# Patient Record
Sex: Male | Born: 1965 | Race: Black or African American | Hispanic: No | Marital: Married | Smoking: Never smoker
Health system: Southern US, Community
[De-identification: ages and names within clinical notes are randomized; demographics above are authoritative.]

## PROBLEM LIST (undated history)

## (undated) DIAGNOSIS — R0602 Shortness of breath: Secondary | ICD-10-CM

## (undated) DIAGNOSIS — Z8489 Family history of other specified conditions: Secondary | ICD-10-CM

## (undated) DIAGNOSIS — G709 Myoneural disorder, unspecified: Secondary | ICD-10-CM

## (undated) DIAGNOSIS — I5042 Chronic combined systolic (congestive) and diastolic (congestive) heart failure: Secondary | ICD-10-CM

## (undated) DIAGNOSIS — H269 Unspecified cataract: Secondary | ICD-10-CM

## (undated) DIAGNOSIS — I420 Dilated cardiomyopathy: Secondary | ICD-10-CM

## (undated) DIAGNOSIS — I5189 Other ill-defined heart diseases: Secondary | ICD-10-CM

## (undated) DIAGNOSIS — C61 Malignant neoplasm of prostate: Secondary | ICD-10-CM

## (undated) DIAGNOSIS — R569 Unspecified convulsions: Secondary | ICD-10-CM

## (undated) DIAGNOSIS — I10 Essential (primary) hypertension: Secondary | ICD-10-CM

## (undated) DIAGNOSIS — E785 Hyperlipidemia, unspecified: Secondary | ICD-10-CM

## (undated) DIAGNOSIS — F419 Anxiety disorder, unspecified: Secondary | ICD-10-CM

## (undated) HISTORY — PX: SHOULDER SURGERY: SHX246

## (undated) HISTORY — DX: Anxiety disorder, unspecified: F41.9

## (undated) HISTORY — DX: Unspecified cataract: H26.9

## (undated) HISTORY — PX: NASAL FRACTURE SURGERY: SHX718

## (undated) HISTORY — DX: Unspecified convulsions: R56.9

## (undated) HISTORY — DX: Chronic combined systolic (congestive) and diastolic (congestive) heart failure: I50.42

## (undated) HISTORY — PX: PROSTATE BIOPSY: SHX241

## (undated) HISTORY — DX: Hyperlipidemia, unspecified: E78.5

## (undated) HISTORY — DX: Dilated cardiomyopathy: I42.0

## (undated) HISTORY — DX: Myoneural disorder, unspecified: G70.9

## (undated) HISTORY — DX: Other ill-defined heart diseases: I51.89

---

## 2001-02-12 ENCOUNTER — Emergency Department (HOSPITAL_COMMUNITY): Admission: EM | Admit: 2001-02-12 | Discharge: 2001-02-12 | Payer: Self-pay | Admitting: Emergency Medicine

## 2005-05-16 ENCOUNTER — Emergency Department (HOSPITAL_COMMUNITY): Admission: EM | Admit: 2005-05-16 | Discharge: 2005-05-16 | Payer: Self-pay | Admitting: Emergency Medicine

## 2008-04-14 ENCOUNTER — Ambulatory Visit: Payer: Self-pay | Admitting: Cardiology

## 2008-04-15 ENCOUNTER — Encounter (INDEPENDENT_AMBULATORY_CARE_PROVIDER_SITE_OTHER): Payer: Self-pay | Admitting: Internal Medicine

## 2008-04-15 ENCOUNTER — Inpatient Hospital Stay (HOSPITAL_COMMUNITY): Admission: EM | Admit: 2008-04-15 | Discharge: 2008-04-17 | Payer: Self-pay | Admitting: Emergency Medicine

## 2008-07-10 ENCOUNTER — Ambulatory Visit: Payer: Self-pay | Admitting: Family Medicine

## 2008-07-17 ENCOUNTER — Ambulatory Visit: Payer: Self-pay | Admitting: Family Medicine

## 2010-05-05 LAB — POCT I-STAT, CHEM 8
BUN: 19 mg/dL (ref 6–23)
Chloride: 102 mEq/L (ref 96–112)
Creatinine, Ser: 1.4 mg/dL (ref 0.4–1.5)
Potassium: 3.8 mEq/L (ref 3.5–5.1)
Sodium: 138 mEq/L (ref 135–145)

## 2010-05-05 LAB — DIFFERENTIAL
Eosinophils Absolute: 0.1 10*3/uL (ref 0.0–0.7)
Eosinophils Relative: 1 % (ref 0–5)
Lymphs Abs: 3.1 10*3/uL (ref 0.7–4.0)
Monocytes Absolute: 0.9 10*3/uL (ref 0.1–1.0)
Monocytes Relative: 10 % (ref 3–12)

## 2010-05-05 LAB — COMPREHENSIVE METABOLIC PANEL
ALT: 22 U/L (ref 0–53)
AST: 30 U/L (ref 0–37)
AST: 35 U/L (ref 0–37)
Albumin: 3.6 g/dL (ref 3.5–5.2)
Albumin: 4.2 g/dL (ref 3.5–5.2)
Alkaline Phosphatase: 65 U/L (ref 39–117)
Alkaline Phosphatase: 70 U/L (ref 39–117)
BUN: 22 mg/dL (ref 6–23)
CO2: 27 mEq/L (ref 19–32)
Chloride: 103 mEq/L (ref 96–112)
Chloride: 103 mEq/L (ref 96–112)
Creatinine, Ser: 1.21 mg/dL (ref 0.4–1.5)
GFR calc Af Amer: >60 mL/min (ref >60)
GFR calc Af Amer: >60 mL/min (ref >60)
GFR calc non Af Amer: >60 mL/min (ref >60)
Potassium: 3.8 mEq/L (ref 3.5–5.1)
Potassium: 4.4 mEq/L (ref 3.5–5.1)
Sodium: 136 mEq/L (ref 135–145)
Total Bilirubin: 0.7 mg/dL (ref 0.3–1.2)
Total Bilirubin: 1.1 mg/dL (ref 0.3–1.2)
Total Protein: 6.3 g/dL (ref 6.0–8.3)

## 2010-05-05 LAB — CK TOTAL AND CKMB (NOT AT ARMC)
CK, MB: 10.8 ng/mL — ABNORMAL HIGH (ref 0.3–4.0)
CK, MB: 4.7 ng/mL — ABNORMAL HIGH (ref 0.3–4.0)
Relative Index: 1.2 (ref 0.0–2.5)
Total CK: 934 U/L — ABNORMAL HIGH (ref 7–232)

## 2010-05-05 LAB — CBC
HCT: 39.3 % (ref 39.0–52.0)
HCT: 40.1 % (ref 39.0–52.0)
Hemoglobin: 13.9 g/dL (ref 13.0–17.0)
MCV: 92.8 fL (ref 78.0–100.0)
MCV: 93.1 fL (ref 78.0–100.0)
Platelets: 336 10*3/uL (ref 150–400)
Platelets: 336 10*3/uL (ref 150–400)
RBC: 4.22 MIL/uL (ref 4.22–5.81)
RBC: 4.32 MIL/uL (ref 4.22–5.81)
WBC: 8.8 10*3/uL (ref 4.0–10.5)
WBC: 9.5 10*3/uL (ref 4.0–10.5)

## 2010-05-05 LAB — PROTIME-INR: INR: 1 (ref 0.00–1.49)

## 2010-05-05 LAB — POCT CARDIAC MARKERS
Myoglobin, poc: 198 ng/mL (ref 12–200)
Troponin i, poc: <0.05 ng/mL (ref 0.00–0.09)

## 2010-05-05 LAB — LIPID PANEL
Cholesterol: 205 mg/dL — ABNORMAL HIGH (ref 0–200)
HDL: 71 mg/dL (ref >39)
LDL Cholesterol: 120 mg/dL — ABNORMAL HIGH (ref 0–99)
Triglycerides: 72 mg/dL (ref <150)

## 2010-05-05 LAB — CARDIAC PANEL(CRET KIN+CKTOT+MB+TROPI)
CK, MB: 7.3 ng/mL — ABNORMAL HIGH (ref 0.3–4.0)
Relative Index: 1.1 (ref 0.0–2.5)
Relative Index: 1.1 (ref 0.0–2.5)

## 2010-05-05 LAB — APTT: aPTT: 30 seconds (ref 24–37)

## 2010-05-05 LAB — MAGNESIUM: Magnesium: 2.5 mg/dL (ref 1.5–2.5)

## 2010-06-07 NOTE — Discharge Summary (Signed)
[name], [name]                  ACCOUNT NO.:  [id number]   MEDICAL RECORD NO.:  [id number]          PATIENT TYPE:  INP   LOCATION:  1433                         FACILITY:  [facility]   PHYSICIAN:  Herbie Saxon, MDDATE OF BIRTH:  1965/05/28   DATE OF ADMISSION:  04/14/2008  DATE OF DISCHARGE:  04/17/2008                               DISCHARGE SUMMARY   DISCHARGE DIAGNOSES:  1. Chest pain, non cardiac.  2. Hypertension urgency, resolved.  3. Rhabdomyolysis, resolved.  4. Hyperlipidemia.  5. Anxiety.  6. Chronic bronchitis.   PROCEDURE:  A 2D echocardiogram of April 15, 2008, showed normal left  ventricular ejection fraction ranging between 55% to 65%.  No left  ventricular regional wall motion abnormality.  There was moderate  increase in left ventricular wall thickness.   RADIOLOGY:  1. A chest x-ray of April 14, 2008, showed minimal bronchitic changes      with minimal scarring, no atelectasis in the left base.  2. Left hip x-ray of April 14, 2008, showed no acute abnormalities.   HOSPITAL COURSE:  This 45 year old African American male presented to  the emergency room complaining of left-sided hip and chest pain.  Patient also reports shortness of breath and diaphoresis with exertion.  Serial cardiac enzymes were negative for acute coronary syndrome.  The  initial CPK was elevated indicating rhabdomyolysis.  Patient was started  on IV fluid hydration, anti-hypertensives at a.c. and beta-blocker,  p.r.n. nitroglycerin.  The lipid panel did show elevated total  cholesterol with elevated LDL fraction and patient has been started on  Zocor on this admission.  Hydrochlorothiazide as added to the blood  pressure regimen on April 16, 2008, to optimize his blood pressure  control.  Patient had been educated on the need for good medication  compliance as an outpatient.   DISCHARGE CONDITION:  Stable.   DIET:  Will be heart healthy, low cholesterol.   ACTIVITY:  To  increase slowly as tolerated.  Patient to report to  emergency room if there is any recurrence of chest pain, shortness of  breath, or dizziness.  Clinical social worker input to assist with  establishing with [facility] in the next 1 week as outpatient.   MEDICATIONS ON DISCHARGE:  1. Enteric-coated aspirin 81 mg daily.  2. Lisinopril 10 mg daily.  3. __________ mg twice daily as needed for anxiety.  4. Nitroglycerin 0.4 mg sublingual every 5 minutes x3 as needed.  5. Clonidine 0.4 mg every 8 hours p.r.n. for blood pressure greater      than 160/110.  6. Simvastatin 40 mg q.h.s.  7. Metoprolol 50 mg b.i.d.  8. Hydrochlorothiazide 25 mg daily.   EXAMINATION:  This is a middle-aged man not in acute distress.  VITAL SIGNS:  Temperature 98.  Pulse 82.  Respiratory rate is 20.  Blood  pressure 140/90.  Pupils equal and reactive to light and accommodation.  HEAD:  Atraumatic, normocephalic.  Mucous membranes are moist.  Oropharynx and nasopharynx are clear.  NECK:  Supple.  No elevated JVD.  No thyromegaly.  No carotid bruit.  CHEST:  Clinically clear.  HEART:  Sounds 1 and 2, regular rate and rhythm.  No rubs, gallops, or  murmurs.  ABDOMEN:  Soft, nontender.  No organomegaly.  Bowel sounds present.  No  inguinal hernia.  CNS:  No focal deficits.  Peripheral pulses present.  No pedal edema.   LABS:  Total CK is 428.  Chemistries, sodium 136, potassium 3.8,  chloride 103, bicarbonate 26, glucose 114, BUN 22, creatinine 1.4.  Troponin 0.03.  Complete blood count, WBC is 9, hematocrit 39, platelet  count 336.   DISCHARGE TIME:  Greater than 30 minutes.      Herbie Saxon, MD  Electronically Signed     MIO/MEDQ  D:  04/17/2008  T:  04/17/2008  Job:  [id number]

## 2010-06-07 NOTE — H&P (Signed)
[name], [name] NO.:  [id number]   MEDICAL RECORD NO.:  [id number]          PATIENT TYPE:  EMS   LOCATION:  ED                           FACILITY:  [facility]   PHYSICIAN:  Manus Gunning, MD      DATE OF BIRTH:  05-18-1965   DATE OF ADMISSION:  04/15/2008  DATE OF DISCHARGE:                              HISTORY & PHYSICAL   ADMITTING SERVICE:  Hospitalist [facility].   CHIEF COMPLAINT:  Left sided hip pain and chest pain.   HISTORY OF PRESENT ILLNESS:  [name] is a pleasant 45 year old African  American male who originally presented with left sided hip pain.  This  has been chronic in nature off and on and sporadic.  He describes it as  rubbing of bone against each other.  X-ray of the hip in the emergency  department demonstrated no acute abnormality and he had symptomatic  improvement with pain medications.  During questioning of patient's  symptoms, he also complained of chest pain which he described as  pressure/tightness, substernal location, approximately 6-8/10, worse  with exertion, approximately 20 minutes of walking on a treadmill would  result in the pain, relieved with rest, associated with shortness of  breath and diaphoresis.  He claims that this has been ongoing for  approximately 4-6 months and that occasionally it is sporadic but often  than not it is brought on by exertion.  He denies palpitations, denies  loss of consciousness.  No syncope, no presyncope, no tinnitus, no  blurring of vision.  No odynophagia, no dysphagia.  No abdominal pain,  no nausea, vomiting.  No PND, no orthopnea.  No cough, no expectoration.  Positive dyspnea on exertion causing shortness of breath.  No bowel or  bladder incontinence.  No bright red blood per rectum, no diarrhea, no  constipation, no dysuria, no polyuria, no musculoskeletal complaints  except for his left hip pain.   PAST MEDICAL HISTORY:  None.  Apparently, he claims that he has been  told  he has hypertension, but has never followed up.   HOME MEDICATIONS:  None.   ALLERGIES:  NONE.   PAST SURGICAL HISTORY:  Fracture of the nose, repaired.   FAMILY HISTORY:  Mother had a history of CABG in her 37s.  Father is  healthy.   SOCIAL HISTORY:  The patient denies tobacco, claims that he drinks  approximately a 12-pack of beer over the course of a week.  Denies  illicit drug use.  He lives at home with his wife and children.   REVIEW OF SYSTEMS:  Essentially a 14-point review of systems has been  performed.  Pertinent positives and negatives as described above.   PHYSICAL EXAMINATION:  VITAL SIGNS:  Temperature 98, pulse 116,  respiratory rate 20, blood pressure 195/116.  O2 saturation 96% on room  air.  GENERAL:  A well-nourished, well-developed African American male lying  in bed comfortably in no apparent distress.  HEENT:  Normocephalic, atraumatic.  Moist oral mucosa.  No thrush,  erythema or postnasal drip.  Eyes are anicteric.  Extraocular  muscles  are intact.  Pupils are equal and reactive to light and accommodation.  NECK:  Supple.  Good range of motion.  No thyromegaly.  No carotid  bruits.  Neck veins appear to be normal.  CARDIOVASCULAR:  S1-S2 normal.  Regular rate and rhythm.  No murmurs,  rubs or gallops.  RS:  Air entry is bilaterally equal.  No rales, rhonchi or wheezes  appreciated.  ABDOMEN:  Soft, nontender, nondistended.  Positive bowel sounds.  No  organomegaly.  EXTREMITIES:  No cyanosis, no clubbing edema, no edema.  Positive  bilateral dorsalis pedis.  CNS:  Alert and oriented x3.  Cranial nerves II through XII are grossly  intact.  Power, sensation and reflexes bilaterally symmetrical.  SKIN:  No breakdown, swelling, ulcerations or masses.  HEM/ONC:  No palpable lymphadenopathy, ecchymosis, bruising or  petechiae.   LABORATORY DATA:  EKG normal sinus rhythm, borderline LVH based on  voltage criteria, though the patient is young.  Troponin-I  0.03,  myoglobin 198, CK-MB 8.2.  Troponin-I repeat at this point less than  0.05.  Relative index of 1.2, CK-MB 10.8.  Sodium 138, potassium 3.8,  chloride 102, BUN 19, creatinine 1.4, glucose 94, calcium 1.1,  hemoglobin 14.6, hematocrit 43, WBC count 8800, platelet count 336.  Left hip complete two-view demonstrates no acute abnormalities.  Chest x-  ray PA and lateral demonstrates minimal bronchitic changes, minimal  scarring or atelectasis in the left base.   ASSESSMENT/PLAN:  Chest pain, rule out acute coronary syndrome.  The  patient describes symptoms of stable angina.  At this time, start the  patient on nitroglycerin 0.4 mg sublingual q.5 minutes p.r.n. chest  pain, max of three doses per episode.  If the pain remains uncontrolled,  use morphine 2 mg IV q.6 h., for uncontrolled pain with nitroglycerin.  Start aspirin 325 mg p.o. daily and oxygen to keep O2 sats greater than  90%.  The patient has uncontrolled blood pressure (hypertensive  urgency).  Start lisinopril 5 mg p.o. daily with metoprolol 25 mg twice  a day.  On reassessment of his blood pressure by the emergency  department status the patient's blood pressure has decreased to 150/90  post 0.2 mg of clonidine.  He has been ordered to be given 5 mg of  metoprolol IV by the emergency department physician.  I will concur and  recommend starting his lisinopril and metoprolol p.o. doses in the  morning.  1. Make patient n.p.o. past midnight for possible stress test in the      morning, though at this time no stress test has been ordered.  We      will check serial cardiac enzymes q.6 h., x3 sets and obtain an EKG      in the morning, as well as a 2-D echocardiogram.  If in the morning      either his biomarkers or EKG are changed or 2-D echocardiogram      demonstrates wall motion abnormalities, cardiology can be consulted      at that time and assessment for further testing can be done then.      At this time, the patient  is to be admitted for serial enzymes and      control of his blood pressure.  2. Gastrointestinal and deep venous thrombosis prophylaxis.  Start      heparin 5000 units subcu q.8 and Protonix 40 mg p.o. daily.      Manus Gunning, MD  Electronically Signed  SP/MEDQ  D:  04/15/2008  T:  04/15/2008  Job:  [id number]

## 2012-04-02 ENCOUNTER — Emergency Department (HOSPITAL_COMMUNITY)
Admission: EM | Admit: 2012-04-02 | Discharge: 2012-04-02 | Discharge status: Absent without leave | Payer: BC Managed Care – PPO | Source: Home / Self Care | Attending: Family Medicine | Admitting: Family Medicine

## 2012-04-02 NOTE — ED Notes (Signed)
No answer in lobby x 2.

## 2012-04-02 NOTE — ED Notes (Signed)
No answer in lobby x1

## 2012-04-02 NOTE — ED Notes (Signed)
No answer in lobby x 3 

## 2012-05-25 ENCOUNTER — Encounter (HOSPITAL_COMMUNITY): Payer: Self-pay | Admitting: *Deleted

## 2012-05-25 ENCOUNTER — Emergency Department (HOSPITAL_COMMUNITY): Payer: BC Managed Care – PPO

## 2012-05-25 ENCOUNTER — Inpatient Hospital Stay (HOSPITAL_COMMUNITY)
Admission: EM | Admit: 2012-05-25 | Discharge: 2012-05-28 | DRG: 127 | Disposition: A | Discharge status: Home / Self Care | Payer: BC Managed Care – PPO | Attending: Family Medicine | Admitting: Family Medicine

## 2012-05-25 DIAGNOSIS — J189 Pneumonia, unspecified organism: Secondary | ICD-10-CM

## 2012-05-25 DIAGNOSIS — R0989 Other specified symptoms and signs involving the circulatory and respiratory systems: Secondary | ICD-10-CM | POA: Diagnosis present

## 2012-05-25 DIAGNOSIS — Z6832 Body mass index (BMI) 32.0-32.9, adult: Secondary | ICD-10-CM

## 2012-05-25 DIAGNOSIS — Z9119 Patient's noncompliance with other medical treatment and regimen: Secondary | ICD-10-CM

## 2012-05-25 DIAGNOSIS — E785 Hyperlipidemia, unspecified: Secondary | ICD-10-CM | POA: Diagnosis present

## 2012-05-25 DIAGNOSIS — I10 Essential (primary) hypertension: Secondary | ICD-10-CM | POA: Diagnosis present

## 2012-05-25 DIAGNOSIS — R0902 Hypoxemia: Secondary | ICD-10-CM | POA: Diagnosis present

## 2012-05-25 DIAGNOSIS — I2510 Atherosclerotic heart disease of native coronary artery without angina pectoris: Secondary | ICD-10-CM | POA: Diagnosis present

## 2012-05-25 DIAGNOSIS — R079 Chest pain, unspecified: Secondary | ICD-10-CM

## 2012-05-25 DIAGNOSIS — I5021 Acute systolic (congestive) heart failure: Principal | ICD-10-CM | POA: Diagnosis present

## 2012-05-25 DIAGNOSIS — I509 Heart failure, unspecified: Secondary | ICD-10-CM

## 2012-05-25 DIAGNOSIS — F1210 Cannabis abuse, uncomplicated: Secondary | ICD-10-CM | POA: Diagnosis present

## 2012-05-25 DIAGNOSIS — Z91199 Patient's noncompliance with other medical treatment and regimen due to unspecified reason: Secondary | ICD-10-CM

## 2012-05-25 DIAGNOSIS — R0600 Dyspnea, unspecified: Secondary | ICD-10-CM

## 2012-05-25 DIAGNOSIS — R0609 Other forms of dyspnea: Secondary | ICD-10-CM | POA: Diagnosis present

## 2012-05-25 DIAGNOSIS — I420 Dilated cardiomyopathy: Secondary | ICD-10-CM

## 2012-05-25 HISTORY — DX: Shortness of breath: R06.02

## 2012-05-25 HISTORY — DX: Hyperlipidemia, unspecified: E78.5

## 2012-05-25 HISTORY — DX: Essential (primary) hypertension: I10

## 2012-05-25 LAB — POCT I-STAT, CHEM 8
Calcium, Ion: 1.01 mmol/L — ABNORMAL LOW (ref 1.12–1.23)
Creatinine, Ser: 1.3 mg/dL (ref 0.50–1.35)
Glucose, Bld: 94 mg/dL (ref 70–99)
Hemoglobin: 14.3 g/dL (ref 13.0–17.0)
Sodium: 143 mEq/L (ref 135–145)
TCO2: 25 mmol/L (ref 0–100)

## 2012-05-25 LAB — CBC
MCH: 31.7 pg (ref 26.0–34.0)
MCV: 90.8 fL (ref 78.0–100.0)
Platelets: 216 10*3/uL (ref 150–400)
RBC: 4.26 MIL/uL (ref 4.22–5.81)
RDW: 12.9 % (ref 11.5–15.5)

## 2012-05-25 LAB — TROPONIN I
Troponin I: <0.3 ng/mL (ref <0.30)
Troponin I: <0.3 ng/mL (ref <0.30)

## 2012-05-25 LAB — APTT: aPTT: 25 seconds (ref 24–37)

## 2012-05-25 LAB — COMPREHENSIVE METABOLIC PANEL
AST: 35 U/L (ref 0–37)
Alkaline Phosphatase: 68 U/L (ref 39–117)
CO2: 25 mEq/L (ref 19–32)
Chloride: 101 mEq/L (ref 96–112)
Creatinine, Ser: 1.07 mg/dL (ref 0.50–1.35)
GFR calc non Af Amer: 80 mL/min — ABNORMAL LOW (ref >90)
Total Bilirubin: 0.4 mg/dL (ref 0.3–1.2)

## 2012-05-25 LAB — HEPARIN LEVEL (UNFRACTIONATED): Heparin Unfractionated: 0.24 IU/mL — ABNORMAL LOW (ref 0.30–0.70)

## 2012-05-25 LAB — POCT I-STAT TROPONIN I

## 2012-05-25 MED ORDER — AZITHROMYCIN 250 MG PO TABS
500.0000 mg | ORAL_TABLET | Freq: Once | ORAL | Status: AC
  Administered 2012-05-25: 500 mg via ORAL
  Filled 2012-05-25: qty 2

## 2012-05-25 MED ORDER — SODIUM CHLORIDE 0.9 % IJ SOLN
3.0000 mL | Freq: Two times a day (BID) | INTRAMUSCULAR | Status: DC
  Administered 2012-05-26 – 2012-05-27 (×3): 3 mL via INTRAVENOUS

## 2012-05-25 MED ORDER — ONDANSETRON HCL 4 MG PO TABS: 4.0000 mg | ORAL_TABLET | Freq: Four times a day (QID) | ORAL | Status: DC | PRN

## 2012-05-25 MED ORDER — HEPARIN SODIUM (PORCINE) 5000 UNIT/ML IJ SOLN: 5000.0000 [IU] | Freq: Three times a day (TID) | INTRAMUSCULAR | Status: DC

## 2012-05-25 MED ORDER — HEPARIN BOLUS VIA INFUSION
2000.0000 [IU] | Freq: Once | INTRAVENOUS | Status: AC
  Administered 2012-05-25: 2000 [IU] via INTRAVENOUS
  Filled 2012-05-25: qty 2000

## 2012-05-25 MED ORDER — MORPHINE SULFATE 2 MG/ML IJ SOLN: 1.0000 mg | INTRAMUSCULAR | Status: DC | PRN

## 2012-05-25 MED ORDER — ASPIRIN 325 MG PO TABS
325.0000 mg | ORAL_TABLET | Freq: Every day | ORAL | Status: DC
  Administered 2012-05-26: 325 mg via ORAL
  Filled 2012-05-25: qty 1

## 2012-05-25 MED ORDER — HEPARIN BOLUS VIA INFUSION
4000.0000 [IU] | Freq: Once | INTRAVENOUS | Status: AC
  Administered 2012-05-25: 4000 [IU] via INTRAVENOUS
  Filled 2012-05-25: qty 4000

## 2012-05-25 MED ORDER — METOPROLOL TARTRATE 25 MG PO TABS
25.0000 mg | ORAL_TABLET | Freq: Two times a day (BID) | ORAL | Status: DC
  Administered 2012-05-25 – 2012-05-26 (×3): 25 mg via ORAL
  Filled 2012-05-25 (×4): qty 1

## 2012-05-25 MED ORDER — HYDRALAZINE HCL 20 MG/ML IJ SOLN
10.0000 mg | Freq: Four times a day (QID) | INTRAMUSCULAR | Status: DC | PRN
  Administered 2012-05-25 – 2012-05-26 (×2): 10 mg via INTRAVENOUS
  Filled 2012-05-25 (×2): qty 1

## 2012-05-25 MED ORDER — HEPARIN (PORCINE) IN NACL 100-0.45 UNIT/ML-% IJ SOLN
1350.0000 [IU]/h | INTRAMUSCULAR | Status: DC
  Administered 2012-05-25: 1150 [IU]/h via INTRAVENOUS
  Administered 2012-05-26: 1350 [IU]/h via INTRAVENOUS
  Filled 2012-05-25 (×3): qty 250

## 2012-05-25 MED ORDER — DEXTROSE 5 % IV SOLN
1.0000 g | Freq: Once | INTRAVENOUS | Status: AC
  Administered 2012-05-25: 1 g via INTRAVENOUS
  Filled 2012-05-25: qty 10

## 2012-05-25 MED ORDER — ACETAMINOPHEN 650 MG RE SUPP: 650.0000 mg | Freq: Four times a day (QID) | RECTAL | Status: DC | PRN

## 2012-05-25 MED ORDER — OXYCODONE HCL 5 MG PO TABS: 5.0000 mg | ORAL_TABLET | ORAL | Status: DC | PRN

## 2012-05-25 MED ORDER — ACETAMINOPHEN 325 MG PO TABS: 650.0000 mg | ORAL_TABLET | Freq: Four times a day (QID) | ORAL | Status: DC | PRN

## 2012-05-25 MED ORDER — FUROSEMIDE 10 MG/ML IJ SOLN
40.0000 mg | Freq: Once | INTRAMUSCULAR | Status: AC
  Administered 2012-05-25: 40 mg via INTRAVENOUS
  Filled 2012-05-25: qty 4

## 2012-05-25 MED ORDER — NITROGLYCERIN 0.4 MG SL SUBL: 0.4000 mg | SUBLINGUAL_TABLET | SUBLINGUAL | Status: DC | PRN

## 2012-05-25 MED ORDER — GUAIFENESIN ER 600 MG PO TB12
1200.0000 mg | ORAL_TABLET | Freq: Two times a day (BID) | ORAL | Status: DC
  Administered 2012-05-25 – 2012-05-28 (×6): 1200 mg via ORAL
  Filled 2012-05-25 (×7): qty 2

## 2012-05-25 MED ORDER — SODIUM CHLORIDE 0.9 % IV SOLN
INTRAVENOUS | Status: DC
  Administered 2012-05-25 – 2012-05-26 (×2): via INTRAVENOUS

## 2012-05-25 MED ORDER — METOPROLOL TARTRATE 25 MG PO TABS: 12.5000 mg | ORAL_TABLET | Freq: Two times a day (BID) | ORAL | Status: DC

## 2012-05-25 MED ORDER — LEVOFLOXACIN IN D5W 750 MG/150ML IV SOLN
750.0000 mg | INTRAVENOUS | Status: DC
  Administered 2012-05-25: 750 mg via INTRAVENOUS
  Filled 2012-05-25 (×3): qty 150

## 2012-05-25 MED ORDER — ONDANSETRON HCL 4 MG/2ML IJ SOLN: 4.0000 mg | Freq: Four times a day (QID) | INTRAMUSCULAR | Status: DC | PRN

## 2012-05-25 NOTE — ED Notes (Signed)
Dr. Mayford Knife here to see pt.

## 2012-05-25 NOTE — Progress Notes (Signed)
ANTICOAGULATION CONSULT NOTE  Pharmacy Consult for Heparin Indication: chest pain/ACS  No Known Allergies  Patient Measurements: Height: 5' 9.5" (176.5 cm) Weight: 220 lb 7.4 oz (100 kg) IBW/kg (Calculated) : 71.85 Heparin Dosing Weight: 93.2 kg  Vital Signs: Temp: 98.3 F (36.8 C) (05/03 2100) Temp src: Oral (05/03 1643) BP: 146/84 mmHg (05/03 2100) Pulse Rate: 96 (05/03 2100)  Labs:  Recent Labs  05/25/12 1207 05/25/12 1234 05/25/12 1540 05/25/12 1718 05/25/12 2105 05/25/12 2239  HGB 13.5 14.3  --   --   --   --   HCT 38.7* 42.0  --   --   --   --   PLT 216  --   --   --   --   --   APTT 25  --   --   --   --   --   LABPROT 13.0  --   --   --   --   --   INR 0.99  --   --   --   --   --   HEPARINUNFRC  --   --   --   --   --  0.24*  CREATININE 1.07 1.30  --   --   --   --   TROPONINI  --   --  <0.30 <0.30 <0.30  --     Estimated Creatinine Clearance: 81.7 ml/min (by C-G formula based on Cr of 1.3).  Assessment:  47 yo male with chest pain for heparin  Goal of Therapy:  Heparin level 0.3-0.7 units/ml Monitor platelets by anticoagulation protocol: Yes   Plan:  Heparin 2000 units IV bolus, then increase heparin 1350 units/hr Follow-up am labs.  Eddie Candle 05/25/2012,11:20 PM

## 2012-05-25 NOTE — ED Provider Notes (Signed)
History     CSN: [id number]  Arrival date & time 05/25/12  1116   First MD Initiated Contact with Patient 05/25/12 1118      Chief Complaint  Patient presents with  . Chest Pain    (Consider location/radiation/quality/duration/timing/severity/associated sxs/prior treatment) The history is provided by the patient.   patient presents with shortness of breath and chest pain. His been going on for a while he states. He is post but on blood pressure medicines but states she's been off them for 3 years because he did not like the way it made him feel. He states he has had decreased physical activity due to shortness of breath and chest tightness. He states that he does physical activity is as short of breath, has chest tightness and will sometimes get sweaty. No fevers. No cough. He states also had episodes of the chest tightness at rest. No weight loss. He does not smoke. He denies other drug use. He has no pain now. He was hypoxic for EMS with sats in the 80s. He has an occasional headache.  Past Medical History  Diagnosis Date  . Hypertension   . Coronary artery disease   . Hyperlipidemia     History reviewed. No pertinent past surgical history.  History reviewed. No pertinent family history.  History  Substance Use Topics  . Smoking status: Never Smoker   . Smokeless tobacco: Not on file  . Alcohol Use: 14.4 oz/week    24 Cans of beer per week      Review of Systems  Constitutional: Negative for activity change and appetite change.  HENT: Negative for neck stiffness.   Eyes: Negative for pain.  Respiratory: Positive for chest tightness and shortness of breath.   Cardiovascular: Positive for chest pain. Negative for leg swelling.  Gastrointestinal: Negative for nausea, vomiting, abdominal pain and diarrhea.  Genitourinary: Negative for flank pain.  Musculoskeletal: Negative for back pain.  Skin: Negative for rash.  Neurological: Positive for headaches. Negative for  weakness and numbness.  Psychiatric/Behavioral: Negative for behavioral problems.    Allergies  Review of patient's allergies indicates no known allergies.  Home Medications   Current Outpatient Rx  Name  Route  Sig  Dispense  Refill  . Multiple Vitamin (MULTIVITAMIN WITH MINERALS) TABS   Oral   Take 1 tablet by mouth daily.           BP 168/97  Pulse 100  Temp(Src) 98.3 F (36.8 C) (Oral)  Resp 24  SpO2 100%  Physical Exam  Nursing note and vitals reviewed. Constitutional: He is oriented to person, place, and time. He appears well-developed and well-nourished.  HENT:  Head: Normocephalic and atraumatic.  Eyes: EOM are normal. Pupils are equal, round, and reactive to light.  Neck: Normal range of motion. Neck supple.  Cardiovascular: Normal rate, regular rhythm and normal heart sounds.   No murmur heard. Pulmonary/Chest: Effort normal and breath sounds normal.  Abdominal: Soft. Bowel sounds are normal. He exhibits no distension and no mass. There is no tenderness. There is no rebound and no guarding.  Musculoskeletal: Normal range of motion. He exhibits edema.  Mild bilateral lower extremity pitting edema.  Neurological: He is alert and oriented to person, place, and time. No cranial nerve deficit.  Skin: Skin is warm and dry.  Psychiatric: He has a normal mood and affect.    ED Course  Procedures (including critical care time)  Labs Reviewed  CBC - Abnormal; Notable for the following:  HCT 38.7 (*)    All other components within normal limits  PRO B NATRIURETIC PEPTIDE - Abnormal; Notable for the following:    Pro B Natriuretic peptide (BNP) 480.3 (*)    All other components within normal limits  COMPREHENSIVE METABOLIC PANEL - Abnormal; Notable for the following:    GFR calc non Af Amer 80 (*)    All other components within normal limits  POCT I-STAT, CHEM 8 - Abnormal; Notable for the following:    Calcium, Ion 1.01 (*)    All other components within  normal limits  PROTIME-INR  APTT  D-DIMER, QUANTITATIVE  POCT I-STAT TROPONIN I   Dg Chest 2 View  05/25/2012  *RADIOLOGY REPORT*  Clinical Data: Shortness of breath  CHEST - 2 VIEW  Comparison: 04/14/2008  Findings: Atelectasis or infiltrate is seen in the right mid lung, along the minor fissure.  There is some linear opacity in the left midlung, probably atelectatic. Cardiopericardial silhouette is at upper limits of normal for size.  No evidence for pleural effusion. The cardiopericardial silhouette is within normal limits for size. Imaged bony structures of the thorax are intact.  IMPRESSION: Atelectasis and / or infiltrate in the right midlung.   Original Report Authenticated By: Kennith Center, M.D.      1. Chest pain   2. Hypoxia   3. CAP (community acquired pneumonia)      Date: 05/25/2012  Rate: 93  Rhythm: normal sinus rhythm  QRS Axis: normal  Intervals: normal  ST/T Wave abnormalities: nonspecific T wave changes  Conduction Disutrbances:none  Narrative Interpretation:   Old EKG Reviewed: none available    MDM  Patient presents with episodes of shortness of breath and chest pain. His been going on for a while. His been worse recently. He states he does a certain amount of exercise and has to take breaks because of chest pain shortness of breath and sometimes sweating. His EKG is reassuring. He is posterior and blood pressure medicines but does not. He is hypoxic here with ambulation. He will be desaturate into the mid 80s. His x-ray shows possible infiltrate. He has no white count and no production of sputum. He'll be treated with antibiotics while it is further investigated. He'll be admitted to internal medicine. He has a negative d-dimer        Juliet Rude. Rubin Payor, MD 05/25/12 1452

## 2012-05-25 NOTE — H&P (Signed)
[facility] History and Physical  [name] ZOX:[id number] DOB: 08-07-1965 DOA: 05/25/2012  Referring physician: Juliet [name]. Dalton Payor, MD PCP: No PCP Per Patient  Specialists: None  Chief Complaint: Chest pain and shortness of breath  HPI: [name] is a 47 y.o. African American male with past medical history of uncontrolled hypertension, patient came to the hospital because of worsening shortness of breath. Patient said for the past few weeks he started to have shortness of breath especially on exertion. He also gets chest pain with it. The chest pain is inframammary, 7/10, felt like a tightness and increased with exertion. This morning he had the pain without exertion so he came in to the emergency department for further evaluation. In the emergency department he was found to be hypoxic also after walking around for short period of time. Initial workup showed questionable pneumonia versus atelectasis on x-ray, 12-lead EKG showed normal sinus rhythm with no evidence of ischemia and first set of cardiac enzymes is negative. Patient also is negative D. dimers.  Review of Systems:  Constitutional: negative for anorexia, fevers and sweats Eyes: negative for irritation, redness and visual disturbance Ears, nose, mouth, throat, and face: negative for earaches, epistaxis, nasal congestion and sore throat Respiratory: negative for cough, dyspnea on exertion, sputum and wheezing Cardiovascular: Has chest pain on exertion Gastrointestinal: negative for abdominal pain, constipation, diarrhea, melena, nausea and vomiting Genitourinary:negative for dysuria, frequency and hematuria Hematologic/lymphatic: negative for bleeding, easy bruising and lymphadenopathy Musculoskeletal:negative for arthralgias, muscle weakness and stiff joints Neurological: negative for coordination problems, gait problems, headaches and weakness Endocrine: negative for diabetic symptoms including polydipsia, polyuria and  weight loss Allergic/Immunologic: negative for anaphylaxis, hay fever and urticaria   Past Medical History  Diagnosis Date  . Hypertension   . Coronary artery disease   . Hyperlipidemia    History reviewed. No pertinent past surgical history. Social History:  reports that he has never smoked. He does not have any smokeless tobacco history on file. He reports that he drinks about 14.4 ounces of alcohol per week. He reports that he uses illicit drugs (Marijuana). 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 Hypertension Mother     Passed away when she is 73 secondary to hip surgery complications  . Hypertension Father     Prior to Admission medications   Medication Sig Start Date End Date Taking? Authorizing Provider  Multiple Vitamin (MULTIVITAMIN WITH MINERALS) TABS Take 1 tablet by mouth daily.   Yes Historical Provider, MD   Physical Exam: Filed Vitals:   05/25/12 1145 05/25/12 1223 05/25/12 1232 05/25/12 1400  BP: 140/84  157/98 168/97  Pulse: 93  98 100  Temp:      TempSrc:      Resp: 28  27 24   SpO2: 94% 91% 96% 100%   General appearance: alert, cooperative and no distress  Head: Normocephalic, without obvious abnormality, atraumatic  Eyes: conjunctivae/corneas clear. PERRL, EOM's intact. Fundi benign.  Nose: Nares normal. Septum midline. Mucosa normal. No drainage or sinus tenderness.  Throat: lips, mucosa, and tongue normal; teeth and gums normal  Neck: Supple, no masses, no cervical lymphadenopathy, no JVD appreciated, no meningeal signs Resp: clear to auscultation bilaterally  Chest wall: no tenderness  Cardio: regular rate and rhythm, S1, S2 normal, no murmur, click, rub or gallop  GI: soft, non-tender; bowel sounds normal; no masses, no organomegaly  Extremities: extremities normal,  atraumatic, no cyanosis or edema  Skin: Skin color, texture, turgor normal. No rashes or  lesions  Neurologic: Alert and oriented X 3, normal strength and tone. Normal symmetric reflexes. Normal coordination and gait   Labs on Admission:  Basic Metabolic Panel:  Recent Labs Lab 05/25/12 1207 05/25/12 1234  NA 141 143  K 4.2 4.3  CL 101 106  CO2 25  --   GLUCOSE 92 94  BUN 15 14  CREATININE 1.07 1.30  CALCIUM 9.2  --    Liver Function Tests:  Recent Labs Lab 05/25/12 1207  AST 35  ALT 26  ALKPHOS 68  BILITOT 0.4  PROT 7.5  ALBUMIN 3.9   No results found for this basename: LIPASE, AMYLASE,  in the last 168 hours No results found for this basename: AMMONIA,  in the last 168 hours CBC:  Recent Labs Lab 05/25/12 1207 05/25/12 1234  WBC 8.1  --   HGB 13.5 14.3  HCT 38.7* 42.0  MCV 90.8  --   PLT 216  --    Cardiac Enzymes: No results found for this basename: CKTOTAL, CKMB, CKMBINDEX, TROPONINI,  in the last 168 hours  BNP (last 3 results)  Recent Labs  05/25/12 1214  PROBNP 480.3*   CBG: No results found for this basename: GLUCAP,  in the last 168 hours  Radiological Exams on Admission: Dg Chest 2 View  05/25/2012  *RADIOLOGY REPORT*  Clinical Data: Shortness of breath  CHEST - 2 VIEW  Comparison: 04/14/2008  Findings: Atelectasis or infiltrate is seen in the right mid lung, along the minor fissure.  There is some linear opacity in the left midlung, probably atelectatic. Cardiopericardial silhouette is at upper limits of normal for size.  No evidence for pleural effusion. The cardiopericardial silhouette is within normal limits for size. Imaged bony structures of the thorax are intact.  IMPRESSION: Atelectasis and / or infiltrate in the right midlung.   Original Report Authenticated By: Kennith Center, M.D.     EKG: Independently reviewed.   Assessment/Plan Principal Problem:   Chest pain Active Problems:   Dyspnea on exertion   HTN (hypertension)   Chest pain -Chest pain on exertion, 12-lead EKG and cardiac enzymes are negative for  ischemia. -Needs further evaluation, I will call cardiology to evaluate him. -Started on aspirin and beta blockers, pain free now, pain resolved after nitroglycerin.  Shortness of breath -I agree with Dr. Rubin [name], patient symptoms is not consistent with pneumonia. -Absence of fever, productive cough and leukocytosis argues against pneumonia. -Started on antibiotics in the ED, continued on Levaquin, probably can DC antibiotics if is still afebrile.  Hypertension -Uncontrolled, patient was not on medication for the past 3 years. -Started the metoprolol low dose, and hydralazine IV as needed. -He will need further titration of his blood pressure medications.  Hypoxia -Patient hypoxic with exertion, has to be on oxygen. His oxygen saturation went down to 80%. -Placed on 6 L of oxygen initially, now on room air, denies any chest pain or shortness of breath now.  Code Status: Full code Family Communication: Plan discussed with patient Disposition Plan: Inpatient  Time spent: 70 minutes  [facility] Pager [phone]  If 7PM-7AM, please contact night-coverage [email or web address] Password [email or web address] 05/25/2012, 2:47 PM

## 2012-05-25 NOTE — ED Notes (Signed)
Dr. Rubin Payor in to see pt and turned off O2 - O2 Sats 92-93% on RA

## 2012-05-25 NOTE — Progress Notes (Signed)
ANTICOAGULATION CONSULT NOTE - Initial Consult  Pharmacy Consult for Heparin Indication: chest pain/ACS  No Known Allergies  Patient Measurements:   Heparin Dosing Weight: 93.2 kg  Vital Signs: Temp: 98.3 F (36.8 C) (05/03 1130) Temp src: Oral (05/03 1130) BP: 168/97 mmHg (05/03 1400) Pulse Rate: 100 (05/03 1400)  Labs:  Recent Labs  05/25/12 1207 05/25/12 1234  HGB 13.5 14.3  HCT 38.7* 42.0  PLT 216  --   APTT 25  --   LABPROT 13.0  --   INR 0.99  --   CREATININE 1.07 1.30    CrCl is unknown because there is no height on file for the current visit.   Medical History: Past Medical History  Diagnosis Date  . Hypertension   . Coronary artery disease   . Hyperlipidemia     Medications: MV  Assessment: CP, SOB  Patient has known uncontrolled HTN, CAD, HLD but has not taken in meds in >3 years. Initial workup showed questionable pneumonia versus atelectasis on x-ray, 12-lead EKG showed normal sinus rhythm, nonspecific T wafe  with no evidence of ischemia and first set of cardiac enzymes is negative. Patient also is negative D. Dimers. Heparin dosing weight 93.2 kg.  Goal of Therapy:  Heparin level 0.3-0.7 units/ml Monitor platelets by anticoagulation protocol: Yes   Plan:  Heparin 4000 unit IV bolus Heparin infusion at 1150 units/hr Check heparin level in 6 hrs Daily heparin level and CBC.   Jarrett Albor S. Merilynn Finland, PharmD, BCPS Clinical Staff Pharmacist Pager [phone]  Misty Stanley Stillinger 05/25/2012,3:33 PM

## 2012-05-25 NOTE — ED Notes (Signed)
To ED per [facility]. Pt awakened with chest pain this am and went to work and chest pain described as pressure with no radiation continued associated with SOB, dizziness, nausea and lightheadedness. Per EMS 6/10 upon arrival; # 18g IV placed and pt treated with 324 mg ASA and NTG SLx4 with decreased in chest to 2/10.Also given Zofran for nausea. Pt denies chest pain/pressure/tightness upon arrival. Reports history of same sx with hospitalization 5 years and discharged with meds but states he has not taken any meds in last 3 years. Pt alert and oriented x4. No chest pain at present

## 2012-05-25 NOTE — ED Notes (Signed)
Report to Gena Fray that report called and new orders per cardiology.

## 2012-05-25 NOTE — ED Notes (Signed)
Dr Mayford Knife ordered lopressor given now. The pt has not been taking his meds for approx 4 years.  The pt was also given aspirin 1030 this am.  324mg .  Not given at this time

## 2012-05-25 NOTE — ED Notes (Signed)
Admitting MD here to see pt.

## 2012-05-25 NOTE — Consult Note (Addendum)
Admit date: 05/25/2012 Referring Physician  Dr. Arthor Captain Primary Physician  None Primary Cardiologist  None Reason for Consultation  SOB/CP  HPI: [name] is a 47 y.o. African American male with past medical history of uncontrolled hypertension, patient came to the hospital because of worsening shortness of breath. Patient said for the past few weeks he started to have shortness of breath especially on exertion. He also gets chest pain with it. The chest pain is inframammary, 7/10, felt like a tightness and increased with exertion. This morning he had the pain without exertion so he came in to the emergency department for further evaluation. In the emergency department he was found to be hypoxic also after walking around for short period of time. Initial workup showed questionable pneumonia versus atelectasis on x-ray, 12-lead EKG showed normal sinus rhythm with no evidence of ischemia and first set of cardiac enzymes is negative. Patient also is negative D. Dimers.  Cardiology is now asked to see for further workup of chest pain and SOB.  He has a history of CP in 2010 at which time a 2D echo was done showing normal LVF ef 55-65%.  CPK was alevated but all other enzymes were negative c/w rhabdo.  Otherwise he has not history of CAD.      PMH:   Past Medical History  Diagnosis Date  . Hypertension   . Coronary artery disease   . Hyperlipidemia      PSH:  History reviewed. No pertinent past surgical history.  Allergies:  Review of patient's allergies indicates no known allergies. Prior to Admit Meds:   (Not in a hospital admission) Fam HX:    Family History  Problem Relation Age of Onset  . Hypertension Mother     Passed away when she is 73 secondary to hip surgery complications  . Hypertension Father    Social HX:    History   Social History  . Marital Status: Married    Spouse Name: N/A    Number of Children: N/A  . Years of Education: N/A   Occupational History  . Not on file.    Social History Main Topics  . Smoking status: Never Smoker   . Smokeless tobacco: Not on file  . Alcohol Use: 14.4 oz/week    24 Cans of beer per week  . Drug Use: Yes    Special: Marijuana     Comment: 2x/week  . Sexually Active: Not on file   Other Topics Concern  . Not on file   Social History Narrative  . No narrative on file     ROS:  All 11 ROS were addressed and are negative except what is stated in the HPI  Physical Exam: Blood pressure 168/97, pulse 100, temperature 98.3 F (36.8 C), temperature source Oral, resp. rate 24, SpO2 100.00%.    General: Well developed, well nourished, in no acute distress Head: Eyes PERRLA, No xanthomas.   Normal cephalic and atramatic  Lungs:   Clear bilaterally to auscultation and percussion. Heart:   HRRR S1 S2 Pulses are 2+ & equal.            No carotid bruit. No JVD.  No abdominal bruits. No femoral bruits. Abdomen: Bowel sounds are positive, abdomen soft and non-tender without masses  Extremities:   No clubbing, cyanosis or edema.  DP +1 Neuro: Alert and oriented X 3. Psych:  Good affect, responds appropriately    Labs:   Lab Results  Component Value Date  WBC 8.1 05/25/2012   HGB 14.3 05/25/2012   HCT 42.0 05/25/2012   MCV 90.8 05/25/2012   PLT 216 05/25/2012    Recent Labs Lab 05/25/12 1207 05/25/12 1234  NA 141 143  K 4.2 4.3  CL 101 106  CO2 25  --   BUN 15 14  CREATININE 1.07 1.30  CALCIUM 9.2  --   PROT 7.5  --   BILITOT 0.4  --   ALKPHOS 68  --   ALT 26  --   AST 35  --   GLUCOSE 92 94   No results found for this basename: PTT   Lab Results  Component Value Date   INR 0.99 05/25/2012   INR 1.0 04/15/2008   Lab Results  Component Value Date   CKTOTAL 428* 04/17/2008   CKMB 4.7* 04/17/2008   TROPONINI  Value: 0.03        NO INDICATION OF MYOCARDIAL INJURY. 04/15/2008     Lab Results  Component Value Date   CHOL  Value: 205        ATP III CLASSIFICATION:  <200     mg/dL   Desirable  409-811  mg/dL    Borderline High  >=914    mg/dL   High       * 7/82/9562   Lab Results  Component Value Date   HDL 71 04/15/2008   Lab Results  Component Value Date   LDLCALC  Value: 120        Total Cholesterol/HDL:CHD Risk Coronary Heart Disease Risk Table                     Men   Women  1/2 Average Risk   3.4   3.3  Average Risk       5.0   4.4  2 X Average Risk   9.6   7.1  3 X Average Risk  23.4   11.0        Use the calculated Patient Ratio above and the CHD Risk Table to determine the patient's CHD Risk.        ATP III CLASSIFICATION (LDL):  <100     mg/dL   Optimal  130-865  mg/dL   Near or Above                    Optimal  130-159  mg/dL   Borderline  784-696  mg/dL   High  >295     mg/dL   Very High* 2/84/1324   Lab Results  Component Value Date   TRIG 72 04/15/2008   Lab Results  Component Value Date   CHOLHDL 2.9 04/15/2008   No results found for this basename: LDLDIRECT      Radiology:  *RADIOLOGY REPORT*  Clinical Data: Shortness of breath  CHEST - 2 VIEW  Comparison: 04/14/2008  Findings: Atelectasis or infiltrate is seen in the right mid lung,  along the minor fissure. There is some linear opacity in the left  midlung, probably atelectatic. Cardiopericardial silhouette is at  upper limits of normal for size. No evidence for pleural effusion.  The cardiopericardial silhouette is within normal limits for size.  Imaged bony structures of the thorax are intact.  IMPRESSION:  Atelectasis and / or infiltrate in the right midlung.  Original Report Authenticated By: Kennith Center, M.D.  EKG:   NSR with nonspecific T wave abnormality  ASSESSMENT:  1.  Chest pain with nonspecific T wave abnormality and  normal cardiac enzymes x 1.  CRF include age. HTN, dyslipidemia. 2.  Acute diastolic CHF possibly secondary to poorly controlled HTN with mildly elevated BNP 3.  HTN poorly controlled 4.  Hypoxia secondary to #2  PLAN:   1.  Cycle cardiac enzymes 2.  2D echo to evaluate LVF 3.  If LVF  is normal and enzymes remain normal then will plan nuclear stress test on Monday 4.  Needs aggressive control of BP 5.  Diuresis with IV Lasix 6.  Agree with beta blocker/ASA and increase Lopressor to 25mg  BID 7.  May benefit from addition of ACE I if need more BP control 8.  IV Heparin gtt per pharmacy until rule out complete  Quintella Reichert, MD  05/25/2012  3:08 PM

## 2012-05-26 LAB — CBC
MCH: 31.5 pg (ref 26.0–34.0)
MCHC: 34.9 g/dL (ref 30.0–36.0)
MCV: 90.4 fL (ref 78.0–100.0)
Platelets: 215 10*3/uL (ref 150–400)
RBC: 4.47 MIL/uL (ref 4.22–5.81)
RDW: 12.9 % (ref 11.5–15.5)

## 2012-05-26 LAB — BASIC METABOLIC PANEL
Calcium: 9.2 mg/dL (ref 8.4–10.5)
Creatinine, Ser: 1.18 mg/dL (ref 0.50–1.35)
GFR calc non Af Amer: 71 mL/min — ABNORMAL LOW (ref >90)
Glucose, Bld: 93 mg/dL (ref 70–99)
Sodium: 138 mEq/L (ref 135–145)

## 2012-05-26 LAB — HEPARIN LEVEL (UNFRACTIONATED): Heparin Unfractionated: 0.34 IU/mL (ref 0.30–0.70)

## 2012-05-26 LAB — HEMOGLOBIN A1C
Hgb A1c MFr Bld: 5 % (ref <5.7)
Mean Plasma Glucose: 97 mg/dL (ref <117)

## 2012-05-26 LAB — TSH: TSH: 1.045 u[IU]/mL (ref 0.350–4.500)

## 2012-05-26 MED ORDER — METOPROLOL TARTRATE 50 MG PO TABS
50.0000 mg | ORAL_TABLET | Freq: Two times a day (BID) | ORAL | Status: DC
  Administered 2012-05-26 – 2012-05-28 (×4): 50 mg via ORAL
  Filled 2012-05-26 (×5): qty 1

## 2012-05-26 MED ORDER — ASPIRIN 81 MG PO CHEW
81.0000 mg | CHEWABLE_TABLET | Freq: Every day | ORAL | Status: DC
  Administered 2012-05-27 – 2012-05-28 (×2): 81 mg via ORAL
  Filled 2012-05-26 (×2): qty 1

## 2012-05-26 MED ORDER — HYDROCHLOROTHIAZIDE 25 MG PO TABS
25.0000 mg | ORAL_TABLET | Freq: Every day | ORAL | Status: DC
  Administered 2012-05-26 – 2012-05-28 (×3): 25 mg via ORAL
  Filled 2012-05-26 (×3): qty 1

## 2012-05-26 MED ORDER — HEPARIN SODIUM (PORCINE) 5000 UNIT/ML IJ SOLN
5000.0000 [IU] | Freq: Three times a day (TID) | INTRAMUSCULAR | Status: DC
  Administered 2012-05-26 – 2012-05-28 (×6): 5000 [IU] via SUBCUTANEOUS
  Filled 2012-05-26 (×9): qty 1

## 2012-05-26 NOTE — Progress Notes (Addendum)
ANTICOAGULATION CONSULT NOTE - Follow Up Consult  Pharmacy Consult for Heparin  Indication: chest pain/ACS  No Known Allergies  Patient Measurements: Height: 5' 9.5" (176.5 cm) Weight: 220 lb 7.4 oz (100 kg) IBW/kg (Calculated) : 71.85 Heparin Dosing Weight: 92.9kg  Vital Signs: Temp: 98.8 F (37.1 C) (05/04 0500) BP: 155/110 mmHg (05/04 0723) Pulse Rate: 82 (05/04 0500)  Labs:  Recent Labs  05/25/12 1207 05/25/12 1234  05/25/12 1718 05/25/12 2105 05/25/12 2239 05/26/12 0458  HGB 13.5 14.3  --   --   --   --  14.1  HCT 38.7* 42.0  --   --   --   --  40.4  PLT 216  --   --   --   --   --  215  APTT 25  --   --   --   --   --   --   LABPROT 13.0  --   --   --   --   --   --   INR 0.99  --   --   --   --   --   --   HEPARINUNFRC  --   --   --   --   --  0.24* 0.34  CREATININE 1.07 1.30  --   --   --   --  1.18  TROPONINI  --   --   < > <0.30 <0.30  --  <0.30  < > = values in this interval not displayed.  Estimated Creatinine Clearance: 90 ml/min (by C-G formula based on Cr of 1.18).   Medications:  Heparin 1350 units/hr  Assessment: 46yom on heparin for CP/ACS. Heparin level (0.34) is now therapeutic after rate increase - will continue current rate and check follow-up level to confirm. - H/H and Plts wnl - No significant bleeding reported  Goal of Therapy:  Heparin level 0.3-0.7 units/ml Monitor platelets by anticoagulation protocol: Yes   Plan:  1. Continue heparin 1350 units/hr (13.5 ml/hr) 2. Recheck heparin level @ 1100 to confirm therapeutic 3. Follow-up heparin plans  Cleon Dew [phone] 05/26/2012,7:51 AM    Addendum: Cardiology has now discontinued heparin infusion for ACS and consulted pharmacy to start heparin for VTE prophylaxis.  Plan: 1. Discontinue heparin infusion, protocol and labs 2. Heparin 5000 units SQ q8h - first dose @ 1400 today 3. Pharmacy will sign off. Please reconsult if additional assistance is needed.    Wilfred Lacy, PharmD Clinical Pharmacist [phone] 05/26/2012, 9:47 AM

## 2012-05-26 NOTE — Progress Notes (Addendum)
SUBJECTIVE:  No complaints of chest pain or SOB  OBJECTIVE:   Vitals:   Filed Vitals:   05/25/12 2100 05/26/12 0500 05/26/12 0723 05/26/12 0910  BP: 146/84 177/108 155/110 192/121  Pulse: 96 82  93  Temp: 98.3 F (36.8 C) 98.8 F (37.1 C)    TempSrc:      Resp: 22 20    Height:      Weight:      SpO2: 96% 95%     I&O's:   Intake/Output Summary (Last 24 hours) at 05/26/12 1610 Last data filed at 05/26/12 0719  Gross per 24 hour  Intake      0 ml  Output   2225 ml  Net  -2225 ml   TELEMETRY: Reviewed telemetry pt in NSR:     PHYSICAL EXAM General: Well developed, well nourished, in no acute distress Head: Eyes PERRLA, No xanthomas.   Normal cephalic and atramatic  Lungs:   Clear bilaterally to auscultation and percussion. Heart:   HRRR S1 S2 Pulses are 2+ & equal. Abdomen: Bowel sounds are positive, abdomen soft and non-tender without masses  Extremities:   No clubbing, cyanosis or edema.  DP +1 Neuro: Alert and oriented X 3. Psych:  Good affect, responds appropriately   LABS: Basic Metabolic Panel:  Recent Labs  96/04/54 1207 05/25/12 1234 05/26/12 0458  NA 141 143 138  K 4.2 4.3 3.5  CL 101 106 101  CO2 25  --  25  GLUCOSE 92 94 93  BUN 15 14 15   CREATININE 1.07 1.30 1.18  CALCIUM 9.2  --  9.2   Liver Function Tests:  Recent Labs  05/25/12 1207  AST 35  ALT 26  ALKPHOS 68  BILITOT 0.4  PROT 7.5  ALBUMIN 3.9   No results found for this basename: LIPASE, AMYLASE,  in the last 72 hours CBC:  Recent Labs  05/25/12 1207 05/25/12 1234 05/26/12 0458  WBC 8.1  --  7.9  HGB 13.5 14.3 14.1  HCT 38.7* 42.0 40.4  MCV 90.8  --  90.4  PLT 216  --  215   Cardiac Enzymes:  Recent Labs  05/25/12 1718 05/25/12 2105 05/26/12 0458  TROPONINI <0.30 <0.30 <0.30   BNP: No components found with this basename: POCBNP,  D-Dimer:  Recent Labs  05/25/12 1227  DDIMER 0.29   Hemoglobin A1C:  Recent Labs  05/25/12 1717  HGBA1C 5.0    Fasting Lipid Panel: No results found for this basename: CHOL, HDL, LDLCALC, TRIG, CHOLHDL, LDLDIRECT,  in the last 72 hours Thyroid Function Tests:  Recent Labs  05/25/12 1717  TSH 1.045   Anemia Panel: No results found for this basename: VITAMINB12, FOLATE, FERRITIN, TIBC, IRON, RETICCTPCT,  in the last 72 hours Coag Panel:   Lab Results  Component Value Date   INR 0.99 05/25/2012   INR 1.0 04/15/2008    RADIOLOGY: Dg Chest 2 View  05/25/2012  *RADIOLOGY REPORT*  Clinical Data: Shortness of breath  CHEST - 2 VIEW  Comparison: 04/14/2008  Findings: Atelectasis or infiltrate is seen in the right mid lung, along the minor fissure.  There is some linear opacity in the left midlung, probably atelectatic. Cardiopericardial silhouette is at upper limits of normal for size.  No evidence for pleural effusion. The cardiopericardial silhouette is within normal limits for size. Imaged bony structures of the thorax are intact.  IMPRESSION: Atelectasis and / or infiltrate in the right midlung.   Original Report Authenticated By: Minerva Areola  Molli Posey, M.D.     ASSESSMENT:  1. Chest pain with nonspecific T wave abnormality and normal cardiac enzymes x4- CRF include age. HTN, dyslipidemia.  2. Acute diastolic CHF possibly secondary to poorly controlled HTN with mildly elevated BNP - appears resolved on  exam 3. HTN poorly controlled and still markedly elevated 4. Hypoxia secondary to #2   PLAN:  1. 2D echo to evaluate LVF  2. If LVF is normal and bp normalizes then will plan nuclear stress test on Monday  3. Needs aggressive control of BP  4. Agree with beta blocker/ASA and increase Lopressor to 50mg  BID  5. D/C IV Heparin gtt and change to VTE prophylaxis  7. Consider adding ACE I or amlodipine for better BP control 6.  Add HCTZ 25mg  daily      Quintella Reichert, MD  05/26/2012  9:18 AM

## 2012-05-26 NOTE — Progress Notes (Signed)
PROGRESS NOTE  [name] ZOX:[id number] DOB: 11-Aug-1965 DOA: 05/25/2012 PCP: No PCP Per Patient  Brief narrative: 47 yr old male admitted 05/25/12 with worsening SOB on exertion starting a couple of weeks prior to admission.  Noted to by Hypoxic in ED-thought was PNA vs Atelecatasis initally-BNP was 430.  Troponins wer eperformed and he was ruled out for ACS.  Cardiology saw patient and recommended initially Heparin Gtt and Echo-Stress testing pending echo  Past medical history-As per Problem list Chart reviewed as below- Admission 3.26.10 for Htn Urgency and non-cardiac CP-EF 55-65% at that time  Consultants:  Cardiology-Dr. Mayford Knife  Procedures:  CXR 05/25/12  Antibiotics:  none   Subjective  Still 4/10 CP.  States it comes and goes and is reproducible c palpation.  States was non-compliant  on anti-htn meds as they made him feel sluggish and not his usual self. He currently doesn't;t feel this way     Objective    Interim History: nad  Telemetry: nad Objective: Filed Vitals:   05/25/12 2100 05/26/12 0500 05/26/12 0723 05/26/12 0910  BP: 146/84 177/108 155/110 192/121  Pulse: 96 82  93  Temp: 98.3 F (36.8 C) 98.8 F (37.1 C)    TempSrc:      Resp: 22 20    Height:      Weight:      SpO2: 96% 95%      Intake/Output Summary (Last 24 hours) at 05/26/12 0943 Last data filed at 05/26/12 0719  Gross per 24 hour  Intake      0 ml  Output   2225 ml  Net  -2225 ml    Exam:  General: alert obese pleasant seems anxiou Cardiovascular: s1 s 2no m/r/g Respiratory: clear Abdomen:  Soft, NT/Nd Skin no LE edema Neuro Intact   Data Reviewed: Basic Metabolic Panel:  Recent Labs Lab 05/25/12 1207 05/25/12 1234 05/26/12 0458  NA 141 143 138  K 4.2 4.3 3.5  CL 101 106 101  CO2 25  --  25  GLUCOSE 92 94 93  BUN 15 14 15   CREATININE 1.07 1.30 1.18  CALCIUM 9.2  --  9.2   Liver Function Tests:  Recent Labs Lab 05/25/12 1207  AST 35  ALT 26  ALKPHOS 68   BILITOT 0.4  PROT 7.5  ALBUMIN 3.9   No results found for this basename: LIPASE, AMYLASE,  in the last 168 hours No results found for this basename: AMMONIA,  in the last 168 hours CBC:  Recent Labs Lab 05/25/12 1207 05/25/12 1234 05/26/12 0458  WBC 8.1  --  7.9  HGB 13.5 14.3 14.1  HCT 38.7* 42.0 40.4  MCV 90.8  --  90.4  PLT 216  --  215   Cardiac Enzymes:  Recent Labs Lab 05/25/12 1540 05/25/12 1718 05/25/12 2105 05/26/12 0458  TROPONINI <0.30 <0.30 <0.30 <0.30   BNP: No components found with this basename: POCBNP,  CBG: No results found for this basename: GLUCAP,  in the last 168 hours  No results found for this or any previous visit (from the past 240 hour(s)).   Studies:              All Imaging reviewed and is as per above notation   Scheduled Meds: . aspirin  81 mg Oral Daily  . guaiFENesin  1,200 mg Oral BID  . hydrochlorothiazide  25 mg Oral Daily  . levofloxacin (LEVAQUIN) IV  750 mg Intravenous Q24H  . metoprolol tartrate  50 mg  Oral BID  . sodium chloride  3 mL Intravenous Q12H   Continuous Infusions:    Assessment/Plan: 1. CP-potentially angina-Per Cardiology-appreciate input.  Follow 2 d echo-If normal NPO for Pharm Stress test am.  Haprain for ACs d/c.  COnitnue Asa 81 daily-get Lipid panel 2. Potential Acute decompensation of Heart failure with preserved EF-await echo.  States he has had some abdominal swelling.  Probably may benefit from diuresis.  get daily weights, strict I/o. 3. Uncontrolled Htn-likely precipitant of ADCHF-Increased Metoprolol to 50 bid 5/4.  Added HCTZ 25 daily.  Rpt Bmet 5/5 am-continue PRN Hydralazine 10 mg q 6 prn 4. 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  Rpt Bmet am 5. Morbid obesity, Body mass index is 32.1 kg/(m^2).  Monitor-needs out-patient strategies for weight-loss 6. Unlikely PNA-D/c Abx-Rpt CXR in am   Code Status: Full Family Communication: None at bedside-patient can update Disposition Plan: inpatient   Pleas Koch, MD  [facility] Pager [phone] 05/26/2012, 9:43 AM    LOS: 1 day

## 2012-05-26 NOTE — Progress Notes (Signed)
  Echocardiogram 2D Echocardiogram has been performed.  [name] FRANCES 05/26/2012, 3:35 PM

## 2012-05-27 ENCOUNTER — Inpatient Hospital Stay (HOSPITAL_COMMUNITY): Payer: BC Managed Care – PPO

## 2012-05-27 LAB — CBC
MCH: 31.5 pg (ref 26.0–34.0)
MCHC: 34.8 g/dL (ref 30.0–36.0)
MCV: 90.7 fL (ref 78.0–100.0)
Platelets: 209 10*3/uL (ref 150–400)
RBC: 4.41 MIL/uL (ref 4.22–5.81)
RDW: 12.7 % (ref 11.5–15.5)

## 2012-05-27 LAB — BASIC METABOLIC PANEL
Calcium: 9.6 mg/dL (ref 8.4–10.5)
Creatinine, Ser: 1.36 mg/dL — ABNORMAL HIGH (ref 0.50–1.35)
GFR calc Af Amer: 70 mL/min — ABNORMAL LOW (ref >90)
GFR calc non Af Amer: 60 mL/min — ABNORMAL LOW (ref >90)
Sodium: 138 mEq/L (ref 135–145)

## 2012-05-27 LAB — LIPID PANEL
Cholesterol: 217 mg/dL — ABNORMAL HIGH (ref 0–200)
Triglycerides: 159 mg/dL — ABNORMAL HIGH (ref <150)
VLDL: 32 mg/dL (ref 0–40)

## 2012-05-27 MED ORDER — TECHNETIUM TC 99M SESTAMIBI GENERIC - CARDIOLITE
30.0000 | Freq: Once | INTRAVENOUS | Status: AC | PRN
  Administered 2012-05-27: 30 via INTRAVENOUS

## 2012-05-27 MED ORDER — TECHNETIUM TC 99M SESTAMIBI GENERIC - CARDIOLITE
10.0000 | Freq: Once | INTRAVENOUS | Status: AC | PRN
  Administered 2012-05-27: 10 via INTRAVENOUS

## 2012-05-27 MED ORDER — ATORVASTATIN CALCIUM 40 MG PO TABS
40.0000 mg | ORAL_TABLET | Freq: Every day | ORAL | Status: DC
  Administered 2012-05-27 – 2012-05-28 (×2): 40 mg via ORAL
  Filled 2012-05-27 (×2): qty 1

## 2012-05-27 MED ORDER — REGADENOSON 0.4 MG/5ML IV SOLN
INTRAVENOUS | Status: AC
  Administered 2012-05-27: 0.4 mg via INTRAVENOUS
  Filled 2012-05-27: qty 5

## 2012-05-27 MED ORDER — REGADENOSON 0.4 MG/5ML IV SOLN: 0.4000 mg | Freq: Once | INTRAVENOUS | Status: AC

## 2012-05-27 MED ORDER — SODIUM CHLORIDE 0.9 % IV SOLN
INTRAVENOUS | Status: DC
  Administered 2012-05-27: 09:00:00 via INTRAVENOUS

## 2012-05-27 NOTE — Care Management Note (Unsigned)
    Page 1 of 1   05/27/2012     2:25:45 PM   CARE MANAGEMENT NOTE 05/27/2012  Patient:  [name], [name]   Account Number:  [id number]  Date Initiated:  05/27/2012  Documentation initiated by:  GRAVES-BIGELOW,Chaney Ingram  Subjective/Objective Assessment:   Pt admitted for chest pain. Post myoview today. Monitoring BUN and Creat.     Action/Plan:   Pt was given contact information for [facility] to establish a PCP. Case Management will continue to follow for disposition needs.   Anticipated DC Date:  05/28/2012   Anticipated DC Plan:  HOME/SELF CARE      DC Planning Services  CM consult      Choice offered to / List presented to:             Status of service:  In process, will continue to follow Medicare Important Message given?   (If response is "NO", the following Medicare IM given date fields will be blank) Date Medicare IM given:   Date Additional Medicare IM given:    Discharge Disposition:    Per UR Regulation:    If discussed at Long Length of Stay Meetings, dates discussed:    Comments:

## 2012-05-27 NOTE — Progress Notes (Signed)
Subjective:  He feels better, no chest pain, shortness of breath has improved. Blood pressure has improved with increase of metoprolol as well as HCTZ.  Echocardiogram did demonstrate markedly reduced ejection fraction of 25-30%. Inferior wall does appear to be akinetic.  Objective:  Vital Signs in the last 24 hours: Temp:  [98.4 F (36.9 C)-99 F (37.2 C)] 98.4 F (36.9 C) (05/05 0500) Pulse Rate:  [71-87] 71 (05/05 0500) Resp:  [18-20] 18 (05/05 0500) BP: (130-166)/(78-104) 130/84 mmHg (05/05 1122) SpO2:  [93 %-98 %] 95 % (05/05 0500) Weight:  [98.748 kg (217 lb 11.2 oz)] 98.748 kg (217 lb 11.2 oz) (05/05 0500)  Intake/Output from previous day: 05/04 0701 - 05/05 0700 In: 1525.5 [P.O.:1200; I.V.:225] Out: 1675 [Urine:1675]   Physical Exam: General: Well developed, well nourished, in no acute distress. Head:  Normocephalic and atraumatic. Lungs: Clear to auscultation and percussion. Heart: Normal S1 and S2.  No murmur, rubs or gallops.  Abdomen: soft, non-tender, positive bowel sounds. Obese Extremities: No clubbing or cyanosis. No edema. Neurologic: Alert and oriented x 3.    Lab Results:  Recent Labs  05/26/12 0458 05/27/12 0535  WBC 7.9 6.1  HGB 14.1 13.9  PLT 215 209    Recent Labs  05/26/12 0458 05/27/12 0535  NA 138 138  K 3.5 3.7  CL 101 100  CO2 25 25  GLUCOSE 93 109*  BUN 15 15  CREATININE 1.18 1.36*    Recent Labs  05/25/12 2105 05/26/12 0458  TROPONINI <0.30 <0.30   Hepatic Function Panel  Recent Labs  05/25/12 1207  PROT 7.5  ALBUMIN 3.9  AST 35  ALT 26  ALKPHOS 68  BILITOT 0.4    Recent Labs  05/27/12 0535  CHOL 217*   No results found for this basename: PROTIME,  in the last 72 hours  Imaging: Dg Chest 2 View  05/27/2012  *RADIOLOGY REPORT*  Clinical Data: Left-sided chest pain and shortness of breath  CHEST - 2 VIEW  Comparison: 05/25/2012  Findings: The cardiac silhouette is stable and upper normal. Vascular pattern  is normal.  Linear opacity lingula is stable. Right mid lung opacity less prominent when compared to the prior study, now appearing more consistent with atelectasis.  No effusion or pneumothorax.  IMPRESSION: Stable lingular atelectasis.  Improved aeration right mid lung zone with some residual opacity most consistent with atelectasis.   Original Report Authenticated By: Esperanza Heir, M.D.    Dg Chest 2 View  05/25/2012  *RADIOLOGY REPORT*  Clinical Data: Shortness of breath  CHEST - 2 VIEW  Comparison: 04/14/2008  Findings: Atelectasis or infiltrate is seen in the right mid lung, along the minor fissure.  There is some linear opacity in the left midlung, probably atelectatic. Cardiopericardial silhouette is at upper limits of normal for size.  No evidence for pleural effusion. The cardiopericardial silhouette is within normal limits for size. Imaged bony structures of the thorax are intact.  IMPRESSION: Atelectasis and / or infiltrate in the right midlung.   Original Report Authenticated By: Kennith Center, M.D.     EKG:  NSR, NSSTW changes  Cardiac Studies:  ECHO - EF 25-30% diffuse with focal inferior posterior wall akinesis. Prior ejection fraction normal.  Assessment/Plan:  Principal Problem:   Chest pain Active Problems:   Dyspnea on exertion   HTN (hypertension)   Hypoxia   Acute CHF  47 year old with acute systolic heart failure, dyspnea, severe uncontrolled hypertension.  -Await nuclear stress test images -Echocardiogram demonstrates severely  reduced ejection fraction. EF 25-30%. -Possible etiologies include hypertensive cardiomyopathy, CAD. TSH was normal. Crit biomarkers normal. Pro BNP 480. Hemoglobin 14. -Tomorrow, depending on blood pressure, I would suggest transitioning him to either carvedilol twice a day or metoprolol succinate given his cardiomyopathy. -If nuclear stress test shows large amount of ischemia, proceed with cardiac catheterization. If no ischemia is present, it  would not be unreasonable to aggressively treat his hypertension/cardiomyopathy with both beta blocker/ACE inhibitor and repeat ejection fraction in 2-3 months. If not resolved or improved, then proceed with cardiac catheterization. Another option would be to proceed with catheterization tomorrow. I will make him n.p.o. past midnight. Dr. Mayford Knife to decide.   Shantelle Alles 05/27/2012, 11:28 AM

## 2012-05-27 NOTE — Plan of Care (Signed)
Problem: Limited Adherence to Nutrition-Related Recommendations (NB-1.6) Goal: Nutrition education Formal process to instruct or train a patient/client in a skill or to impart knowledge to help patients/clients voluntarily manage or modify food choices and eating behavior to maintain or improve health. Outcome: Completed/Met Date Met:  05/27/12  RD consulted for nutrition education CHF.  RD provided "Heart Failure Nutrition Therapy" handout from the [employer]. Reviewed patient's dietary recall. Provided examples on ways to decrease sodium intake in diet. Discouraged intake of processed foods and use of salt shaker. Encouraged fresh fruits and vegetables as well as whole grain sources of carbohydrates to maximize fiber intake. Teach back method used.  Expect fair compliance.  Body mass index is 31.7 kg/(m^2). Pt meets criteria for Obesity Class I based on current BMI.  Current diet order is Heat Healthy, patient is consuming approximately 100% of meals at this time. Labs and medications reviewed.   No further nutrition interventions warranted at this time.  If additional nutrition issues arise, please re-consult RD.   Maureen Chatters, RD, LDN Pager #: [phone] After-Hours Pager #: [phone]

## 2012-05-27 NOTE — Progress Notes (Signed)
PROGRESS NOTE  [name] ZOX:[id number] DOB: Mar 12, 1965 DOA: 05/25/2012 PCP: No PCP Per Patient  Brief narrative: 47 yr old male admitted 05/25/12 with worsening SOB on exertion starting a couple of weeks prior to admission.  Noted to by Hypoxic in ED-thought was PNA vs Atelecatasis initally-BNP was 430.  Troponins wer eperformed and he was ruled out for ACS.  Cardiology saw patient and recommended initially Heparin Gtt and Echo-Stress testing pending echo  Past medical history-As per Problem list Chart reviewed as below- Admission 3.26.10 for Htn Urgency and non-cardiac CP-EF 55-65% at that time  Consultants:  Cardiology-Dr. Mayford Knife  Procedures:  CXR 05/25/12  Antibiotics:  none   Subjective  Her no specific complaint. Is a little anxious. No nausea vomiting. Denies any specific   Objective    Interim History: nad  Telemetry: nad  Objective: Filed Vitals:   05/27/12 1118 05/27/12 1120 05/27/12 1122 05/27/12 1400  BP: 152/89 143/86 130/84 143/106  Pulse:    83  Temp:    98.1 F (36.7 C)  TempSrc:    Oral  Resp:    17  Height:      Weight:      SpO2:    97%    Intake/Output Summary (Last 24 hours) at 05/27/12 1602 Last data filed at 05/27/12 1540  Gross per 24 hour  Intake    960 ml  Output   1150 ml  Net   -190 ml    Exam:  General: alert obese pleasant seems anxiou Cardiovascular: s1 s 2no m/r/g Respiratory: clear Abdomen:  Soft, NT/Nd Skin no LE edema Neuro Intact   Data Reviewed: Basic Metabolic Panel:  Recent Labs Lab 05/25/12 1207 05/25/12 1234 05/26/12 0458 05/27/12 0535  NA 141 143 138 138  K 4.2 4.3 3.5 3.7  CL 101 106 101 100  CO2 25  --  25 25  GLUCOSE 92 94 93 109*  BUN 15 14 15 15   CREATININE 1.07 1.30 1.18 1.36*  CALCIUM 9.2  --  9.2 9.6   Liver Function Tests:  Recent Labs Lab 05/25/12 1207  AST 35  ALT 26  ALKPHOS 68  BILITOT 0.4  PROT 7.5  ALBUMIN 3.9   No results found for this basename: LIPASE, AMYLASE,  in  the last 168 hours No results found for this basename: AMMONIA,  in the last 168 hours CBC:  Recent Labs Lab 05/25/12 1207 05/25/12 1234 05/26/12 0458 05/27/12 0535  WBC 8.1  --  7.9 6.1  HGB 13.5 14.3 14.1 13.9  HCT 38.7* 42.0 40.4 40.0  MCV 90.8  --  90.4 90.7  PLT 216  --  215 209   Cardiac Enzymes:  Recent Labs Lab 05/25/12 1540 05/25/12 1718 05/25/12 2105 05/26/12 0458  TROPONINI <0.30 <0.30 <0.30 <0.30   BNP: No components found with this basename: POCBNP,  CBG: No results found for this basename: GLUCAP,  in the last 168 hours  No results found for this or any previous visit (from the past 240 hour(s)).   Studies:              All Imaging reviewed and is as per above notation   Scheduled Meds: . aspirin  81 mg Oral Daily  . guaiFENesin  1,200 mg Oral BID  . heparin subcutaneous  5,000 Units Subcutaneous Q8H  . hydrochlorothiazide  25 mg Oral Daily  . metoprolol tartrate  50 mg Oral BID  . sodium chloride  3 mL Intravenous Q12H   Continuous  Infusions: . sodium chloride 50 mL/hr at 05/27/12 0831     Assessment/Plan: 1. CP-potentially angina-Per Cardiology-appreciate input.  Echocardiogram = EF 25-30%, stress test confirms EF 20% but no reversible wall motion abnormality- potential for cardiac catheterization ? Continue Asa 81  2. Hyperlipidemia-total cholesterol 217 LDL 99-start intensity statin Lipitor 40 mg daily 3. Potential Acute decompensation of Heart failure with preserved EF-  Probably may benefit from diuresis.  get daily weights, strict I/o- currently -2 L-diuresis per cardiology 4. Uncontrolled Htn-likely precipitant of ADCHF-Increased Metoprolol to 50 bid 5/4.  Added HCTZ 25 daily.  Rpt Bmet 5/5 am-continue PRN Hydralazine 10 mg q 6 prn 5. 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  Rpt Bmet am 6. Morbid obesity, Body mass index is 31.7 kg/(m^2).  Monitor-needs out-patient strategies for weight-loss 7. Unlikely PNA-D/c Abx-Rpt CXR 05/27/12 showed no  pneumonia   Code Status: Full Family Communication: None at bedside-patient can update Disposition Plan: inpatient   Pleas Koch, MD  [facility] Pager [phone] 05/27/2012, 4:02 PM    LOS: 2 days

## 2012-05-28 LAB — CBC
MCV: 89.5 fL (ref 78.0–100.0)
Platelets: 194 10*3/uL (ref 150–400)
RDW: 12.5 % (ref 11.5–15.5)
WBC: 6.5 10*3/uL (ref 4.0–10.5)

## 2012-05-28 LAB — BASIC METABOLIC PANEL
Chloride: 100 mEq/L (ref 96–112)
Creatinine, Ser: 1.4 mg/dL — ABNORMAL HIGH (ref 0.50–1.35)
GFR calc Af Amer: 67 mL/min — ABNORMAL LOW (ref >90)
Sodium: 140 mEq/L (ref 135–145)

## 2012-05-28 MED ORDER — HYDROCHLOROTHIAZIDE 25 MG PO TABS: 25.0000 mg | ORAL_TABLET | Freq: Every day | ORAL | Status: DC

## 2012-05-28 MED ORDER — ISOSORB DINITRATE-HYDRALAZINE 20-37.5 MG PO TABS: 1.0000 | ORAL_TABLET | Freq: Two times a day (BID) | ORAL | Status: DC

## 2012-05-28 MED ORDER — ISOSORB DINITRATE-HYDRALAZINE 20-37.5 MG PO TABS
1.0000 | ORAL_TABLET | Freq: Two times a day (BID) | ORAL | Status: DC
  Administered 2012-05-28: 1 via ORAL
  Filled 2012-05-28 (×2): qty 1

## 2012-05-28 MED ORDER — ATORVASTATIN CALCIUM 40 MG PO TABS: 40.0000 mg | ORAL_TABLET | Freq: Every day | ORAL | Status: DC

## 2012-05-28 MED ORDER — ASPIRIN 81 MG PO CHEW: 81.0000 mg | CHEWABLE_TABLET | Freq: Every day | ORAL | Status: DC

## 2012-05-28 MED ORDER — METOPROLOL TARTRATE 50 MG PO TABS: 50.0000 mg | ORAL_TABLET | Freq: Two times a day (BID) | ORAL | Status: DC

## 2012-05-28 NOTE — Discharge Summary (Addendum)
Physician Discharge Summary  Din Bookwalter NWG:[id number] DOB: January 23, 1966 DOA: 05/25/2012  PCP: No PCP Per Patient  Admit date: 05/25/2012 Discharge date: 05/28/2012  Time spent: 40 minutes  Recommendations for Outpatient Follow-up:  1. Patient received a list of PCP's 2. Needs outpatient monitoring of BP, counseling for diet and exercise 3. Recommend rpt Echo 3-6 months 4. Needs Bmet/Cbc on follow-up visit to Dr. Kym Groom [cardiology] 5. Follow with ophthalmologist as needed   Discharge Diagnoses:  Principal Problem:   Chest pain Active Problems:   Dyspnea on exertion   HTN (hypertension)   Hypoxia   Acute CHF   Discharge Condition: stable  Diet recommendation: heart healthy  Filed Weights   05/26/12 1100 05/27/12 0500 05/28/12 0520  Weight: 100.381 kg (221 lb 4.8 oz) 98.748 kg (217 lb 11.2 oz) 98.249 kg (216 lb 9.6 oz)    History of present illness:  47 yr old male admitted 05/25/12 with worsening SOB on exertion starting a couple of weeks prior to admission. Noted to by Hypoxic in ED-thought was PNA vs Atelecatasis initally-BNP was 430. Troponins were performed and were negative and he was ruled out for ACS. Cardiology saw patient and recommended initially Heparin Gtt and Echo-Stress testing pending echo. 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 Medications were readjusted and patient was not symptomatic In addition he had an elevated cholesterol start her on a statin because of his symptomatology  It was thought that patient would benefit  from low-dose aspirin 81 mg on discharge until cardiology sees   he states that he's had some blurred vision as well and will need ophthalmology followup-patient was educated about his conditions and reminded hypertension could be the one thread linking his conditions He was a little hypotensive with BIdidl and this was started but then held and need   Discharge Exam: Filed Vitals:   05/27/12 1122 05/27/12 1400 05/27/12 2111 05/28/12 0520  BP: 130/84 143/106 137/88 149/96  Pulse:  83 72 72  Temp:  98.1 F (36.7 C) 97.9 F (36.6 C) 98.2 F (36.8 C)  TempSrc:  Oral Oral Oral  Resp:  17 18 18   Height:      Weight:    98.249 kg (216 lb 9.6 oz)  SpO2:  97% 97% 100%   Well, no c/o-denies cp/sob blurred or double vision  General: eomi, ncat Cardiovascular:  s1 s2 no m/r/g Respiratory: clear  Discharge Instructions  Discharge Orders   Future Orders Complete By Expires     Diet - low sodium heart healthy  As directed     Discharge instructions  As directed     Comments:      Take you meds daily and do no miss taking them Follow up with Dr. Mayford Knife in 1 week Get lab work in about 1 week at Dr. [facility] Find a Doctor and follow-up with them    Increase activity slowly  As directed         Medication List    STOP taking these medications       multivitamin with minerals Tabs  TAKE these medications       aspirin 81 MG chewable tablet  Chew 1 tablet (81 mg total) by mouth daily.     atorvastatin 40 MG tablet  Commonly known as:  LIPITOR  Take 1 tablet (40 mg total) by mouth daily at 6 PM.     hydrochlorothiazide 25 MG tablet  Commonly known as:  HYDRODIURIL  Take 1 tablet (25 mg total) by mouth daily.     metoprolol 50 MG tablet  Commonly known as:  LOPRESSOR  Take 1 tablet (50 mg total) by mouth 2 (two) times daily.       No Known Allergies     Follow-up Information   Follow up with No PCP Per Patient.   Contact information:   [address] [place] [address] [phone]       Follow up with Quintella Reichert, MD In 1 week. (Call her offcie in 1 week and get appt.)    Contact information:   [address] [place] [address] [phone]        The results of significant diagnostics from this hospitalization (including imaging, microbiology, ancillary and laboratory) are listed below for reference.    Significant Diagnostic Studies: Dg Chest 2 View  05/27/2012  *RADIOLOGY REPORT*  Clinical Data: Left-sided chest pain and shortness of breath  CHEST - 2 VIEW  Comparison: 05/25/2012  Findings: The cardiac silhouette is stable and upper normal. Vascular pattern is normal.  Linear opacity lingula is stable. Right mid lung opacity less prominent when compared to the prior study, now appearing more consistent with atelectasis.  No effusion or pneumothorax.  IMPRESSION: Stable lingular atelectasis.  Improved aeration right mid lung zone with some residual opacity most consistent with atelectasis.   Original Report Authenticated By: Esperanza Heir, M.D.    Dg Chest 2 View  05/25/2012  *RADIOLOGY REPORT*  Clinical Data: Shortness of breath  CHEST - 2 VIEW  Comparison: 04/14/2008  Findings: Atelectasis or infiltrate is seen in the right mid lung, along the minor fissure.  There is some linear opacity in the left midlung, probably atelectatic. Cardiopericardial silhouette is at upper limits of normal for size.  No evidence for pleural effusion. The cardiopericardial silhouette is within normal limits for size. Imaged bony structures of the thorax are intact.  IMPRESSION: Atelectasis and / or infiltrate in the right midlung.   Original Report Authenticated By: Kennith Center, M.D.    Nm Myocar Single W/spect W/wall Motion And Ef  05/27/2012  *RADIOLOGY REPORT*  Clinical Data:  Chest pain.  Myocardial ischemia.  MYOCARDIAL IMAGING WITH SPECT (REST AND PHARMACOLOGIC-STRESS) GATED LEFT VENTRICULAR WALL MOTION STUDY LEFT VENTRICULAR  EJECTION FRACTION  Technique:  Standard myocardial SPECT imaging was performed after resting intravenous injection of 10 mCi Tc-59m sestamibi. Subsequently, intravenous infusion of Lexiscan was performed under the supervision of the Cardiology staff.  At peak effect of the drug, 30 mCi Tc-21m sestamibi was injected intravenously and standard myocardial SPECT imaging was performed.  Quantitative gated imaging was also performed to evaluate left ventricular wall motion and estimate left ventricular ejection fraction.  Comparison:  Chest radiograph 05/27/2012.  Findings:  Diffuse left ventricular enlargement is present with global hypokinesis.  Calculated ejection fraction is markedly low at 28%.  The end-diastolic volume is 207 ml and the end-systolic volume is 149 ml.  There is diffuse myocardial thinning.  Small fixed defect is present in the mid anterior wall.  There are no reversible perfusion  defects.  IMPRESSION:  1.  No reversible ischemia. 2.  Calculated ejection fraction markedly low at 28%. 3.  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 4.  Global hypokinesis.   Original Report Authenticated By: Andreas Newport, M.D.     Microbiology: No results found for this or any previous visit (from the past 240 hour(s)).   Labs: Basic Metabolic Panel:  Recent Labs Lab 05/25/12 1207 05/25/12 1234 05/26/12 0458 05/27/12 0535 05/28/12 0420  NA 141 143 138 138 140  K 4.2 4.3 3.5 3.7 4.0  CL 101 106 101 100 100  CO2 25  --  25 25 26   GLUCOSE 92 94 93 109* 94  BUN 15 14 15 15 19   CREATININE 1.07 1.30 1.18 1.36* 1.40*  CALCIUM 9.2  --  9.2 9.6 9.8   Liver Function Tests:  Recent Labs Lab 05/25/12 1207  AST 35  ALT 26  ALKPHOS 68  BILITOT 0.4  PROT 7.5  ALBUMIN 3.9   No results found for this basename: LIPASE, AMYLASE,  in the last 168 hours No results found for this basename: AMMONIA,  in the last 168 hours CBC:  Recent Labs Lab 05/25/12 1207 05/25/12 1234  05/26/12 0458 05/27/12 0535 05/28/12 0420  WBC 8.1  --  7.9 6.1 6.5  HGB 13.5 14.3 14.1 13.9 13.7  HCT 38.7* 42.0 40.4 40.0 39.1  MCV 90.8  --  90.4 90.7 89.5  PLT 216  --  215 209 194   Cardiac Enzymes:  Recent Labs Lab 05/25/12 1540 05/25/12 1718 05/25/12 2105 05/26/12 0458  TROPONINI <0.30 <0.30 <0.30 <0.30   BNP: BNP (last 3 results)  Recent Labs  05/25/12 1214  PROBNP 480.3*   CBG: No results found for this basename: GLUCAP,  in the last 168 hours     Signed:  Rhetta Mura  [facility] 05/28/2012, 9:40 AM

## 2012-05-28 NOTE — Progress Notes (Signed)
Utilization review completed. Miamarie Moll, RN, BSN. 

## 2012-05-28 NOTE — Progress Notes (Signed)
Pharmacist Heart Failure Core Measure Documentation  Assessment: [name] has an EF documented as 25-35% on 05/26/12 by 2D echo.  Rationale: Heart failure patients with left ventricular systolic dysfunction (LVSD) and an EF < 40% should be prescribed an angiotensin converting enzyme inhibitor (ACEI) or angiotensin receptor blocker (ARB) at discharge unless a contraindication is documented in the medical record.  This patient is not currently on an ACEI or ARB for HF.  This note is being placed in the record in order to provide documentation that a contraindication to the use of these agents is present for this encounter.  ACE Inhibitor or Angiotensin Receptor Blocker is contraindicated (specify all that apply)  []   ACEI allergy AND ARB allergy []   Angioedema []   Moderate or severe aortic stenosis []   Hyperkalemia []   Hypotension []   Renal artery stenosis [x]   Worsening renal function, preexisting renal disease or dysfunction   Lucia Gaskins 05/28/2012 11:33 AM

## 2012-05-28 NOTE — Progress Notes (Signed)
   CARE MANAGEMENT NOTE 05/28/2012  Patient:  [name], [name]   Account Number:  [id number]  Date Initiated:  05/27/2012  Documentation initiated by:  GRAVES-BIGELOW,BRENDA  Subjective/Objective Assessment:   Pt admitted for chest pain. Post myoview today. Monitoring BUN and Creat.     Action/Plan:   Pt was given contact information for [facility] to establish a PCP. Case Management will continue to follow for disposition needs.   Anticipated DC Date:  05/28/2012   Anticipated DC Plan:  HOME/SELF CARE      DC Planning Services  CM consult  Medication Assistance      Choice offered to / List presented to:             Status of service:  Completed, signed off Medicare Important Message given?   (If response is "NO", the following Medicare IM given date fields will be blank) Date Medicare IM given:   Date Additional Medicare IM given:    Discharge Disposition:  HOME/SELF CARE  Per UR Regulation:    If discussed at Long Length of Stay Meetings, dates discussed:    Comments:  05/28/2012 1500 NCM spoke to pt and states he cannot afford his medications. Contacted [facility] and they do not have his insurance information on file. Explained to pt they can check his copay for meds with his insurance info. Bidil was dc and provided pt with Lipitor savings cards that may decrease copay as low as $4. Isidoro Donning RN CCM Case Mgmt phone [phone]

## 2012-05-28 NOTE — Progress Notes (Signed)
Patient's bp 103/64 this afternoon after blood pressure medications this am.  Patient did complain of dizziness this morning while ambulating in room.  Dr. Mahala Menghini paged and notified, orders received to instruct patient not to take BIDIL as ordered on his discharge instructions.  Patient instructed not to take Bidil and to followup with Dr. Mayford Knife early next week to monitor his blood pressures.  I called over to [facility] on [address] and instructed them/Jessica to place Bidil prescription on hold.  Discharge instructions updated by hand.  Patient stated he understood these instructions.  Patient also asking for medication assistance.  Case manager made aware and given coupons for medications.  Instructions given about medications, heart failure, and hypertension.  Patient discharged to home.  Colman Cater

## 2012-10-25 ENCOUNTER — Telehealth: Payer: Self-pay | Admitting: Cardiology

## 2012-10-25 NOTE — Telephone Encounter (Signed)
Left pt a detail message that Dr. Norris Cross assist may have call him. I was not able to find a message in his records. I will let Duwayne Heck CMA know that he had return a call.

## 2012-10-25 NOTE — Telephone Encounter (Signed)
New Problem/Follow Up Pt returning the call.

## 2012-10-28 NOTE — Telephone Encounter (Signed)
elevated calcium and low sodium - please have patient stop HCTZ  and recheck BMET in 1 week

## 2012-10-28 NOTE — Telephone Encounter (Signed)
Called and LVM for pt to return call.

## 2012-10-30 NOTE — Telephone Encounter (Signed)
LVM for pt to return call

## 2012-11-03 ENCOUNTER — Encounter: Payer: Self-pay | Admitting: *Deleted

## 2012-11-03 ENCOUNTER — Encounter: Payer: Self-pay | Admitting: Cardiology

## 2012-11-07 ENCOUNTER — Ambulatory Visit: Payer: BC Managed Care – PPO | Admitting: Cardiology

## 2012-11-07 NOTE — Telephone Encounter (Signed)
Pt no showed appt with Dr. Mayford Knife today.

## 2012-11-08 ENCOUNTER — Encounter: Payer: Self-pay | Admitting: Cardiology

## 2012-11-08 DIAGNOSIS — I5042 Chronic combined systolic (congestive) and diastolic (congestive) heart failure: Secondary | ICD-10-CM | POA: Insufficient documentation

## 2012-11-08 DIAGNOSIS — I420 Dilated cardiomyopathy: Secondary | ICD-10-CM | POA: Insufficient documentation

## 2012-11-08 NOTE — Telephone Encounter (Signed)
lmtrc

## 2012-11-13 ENCOUNTER — Encounter: Payer: Self-pay | Admitting: General Surgery

## 2012-11-13 NOTE — Telephone Encounter (Signed)
Sent letter to pt to contact office.

## 2012-11-29 ENCOUNTER — Other Ambulatory Visit: Payer: BC Managed Care – PPO

## 2013-07-21 ENCOUNTER — Emergency Department (HOSPITAL_COMMUNITY)
Admission: EM | Admit: 2013-07-21 | Discharge: 2013-07-21 | Disposition: A | Discharge status: Home / Self Care | Payer: BC Managed Care – PPO | Attending: Emergency Medicine | Admitting: Emergency Medicine

## 2013-07-21 ENCOUNTER — Other Ambulatory Visit: Payer: Self-pay

## 2013-07-21 ENCOUNTER — Encounter (HOSPITAL_COMMUNITY): Payer: Self-pay | Admitting: Emergency Medicine

## 2013-07-21 ENCOUNTER — Emergency Department (HOSPITAL_COMMUNITY): Payer: BC Managed Care – PPO

## 2013-07-21 DIAGNOSIS — N289 Disorder of kidney and ureter, unspecified: Secondary | ICD-10-CM

## 2013-07-21 DIAGNOSIS — R1013 Epigastric pain: Secondary | ICD-10-CM | POA: Insufficient documentation

## 2013-07-21 DIAGNOSIS — Z79899 Other long term (current) drug therapy: Secondary | ICD-10-CM | POA: Insufficient documentation

## 2013-07-21 DIAGNOSIS — E785 Hyperlipidemia, unspecified: Secondary | ICD-10-CM | POA: Insufficient documentation

## 2013-07-21 DIAGNOSIS — R0789 Other chest pain: Secondary | ICD-10-CM | POA: Insufficient documentation

## 2013-07-21 DIAGNOSIS — I5042 Chronic combined systolic (congestive) and diastolic (congestive) heart failure: Secondary | ICD-10-CM | POA: Insufficient documentation

## 2013-07-21 DIAGNOSIS — I10 Essential (primary) hypertension: Secondary | ICD-10-CM | POA: Insufficient documentation

## 2013-07-21 DIAGNOSIS — R0602 Shortness of breath: Secondary | ICD-10-CM | POA: Insufficient documentation

## 2013-07-21 LAB — BASIC METABOLIC PANEL
BUN: 24 mg/dL — ABNORMAL HIGH (ref 6–23)
CALCIUM: 9.7 mg/dL (ref 8.4–10.5)
CO2: 23 meq/L (ref 19–32)
CREATININE: 1.65 mg/dL — AB (ref 0.50–1.35)
Chloride: 99 mEq/L (ref 96–112)
GFR calc Af Amer: 55 mL/min — ABNORMAL LOW (ref >90)
GFR, EST NON AFRICAN AMERICAN: 47 mL/min — AB (ref >90)
GLUCOSE: 90 mg/dL (ref 70–99)
Potassium: 3.8 mEq/L (ref 3.7–5.3)
SODIUM: 139 meq/L (ref 137–147)

## 2013-07-21 LAB — HEPATIC FUNCTION PANEL
ALBUMIN: 4.1 g/dL (ref 3.5–5.2)
ALK PHOS: 59 U/L (ref 39–117)
ALT: 32 U/L (ref 0–53)
AST: 48 U/L — AB (ref 0–37)
BILIRUBIN TOTAL: 0.3 mg/dL (ref 0.3–1.2)
Total Protein: 7.9 g/dL (ref 6.0–8.3)

## 2013-07-21 LAB — CBC
HCT: 39.8 % (ref 39.0–52.0)
HEMOGLOBIN: 13.6 g/dL (ref 13.0–17.0)
MCH: 31.3 pg (ref 26.0–34.0)
MCHC: 34.2 g/dL (ref 30.0–36.0)
MCV: 91.7 fL (ref 78.0–100.0)
Platelets: 271 10*3/uL (ref 150–400)
RBC: 4.34 MIL/uL (ref 4.22–5.81)
RDW: 11.7 % (ref 11.5–15.5)
WBC: 7 10*3/uL (ref 4.0–10.5)

## 2013-07-21 LAB — I-STAT TROPONIN, ED
Troponin i, poc: 0 ng/mL (ref 0.00–0.08)
Troponin i, poc: 0 ng/mL (ref 0.00–0.08)

## 2013-07-21 LAB — PRO B NATRIURETIC PEPTIDE
Pro B Natriuretic peptide (BNP): 38.6 pg/mL (ref 0–125)
Pro B Natriuretic peptide (BNP): 34.8 pg/mL (ref 0–125)

## 2013-07-21 LAB — LIPASE, BLOOD: LIPASE: 36 U/L (ref 11–59)

## 2013-07-21 MED ORDER — OMEPRAZOLE 20 MG PO CPDR: 20.0000 mg | DELAYED_RELEASE_CAPSULE | Freq: Every day | ORAL | Status: DC

## 2013-07-21 MED ORDER — RANITIDINE HCL 150 MG PO CAPS: 150.0000 mg | ORAL_CAPSULE | Freq: Every day | ORAL | Status: DC

## 2013-07-21 MED ORDER — GI COCKTAIL ~~LOC~~
30.0000 mL | Freq: Once | ORAL | Status: AC
  Administered 2013-07-21: 30 mL via ORAL
  Filled 2013-07-21: qty 30

## 2013-07-21 NOTE — ED Notes (Addendum)
Pt c/o of chest pain x 5 days with discomfort under both breast "that feels tight" and is worse with movement. Pt reports SOB with especially with any exertion. Denies injury.

## 2013-07-21 NOTE — Discharge Instructions (Signed)
Your pain is likely related to heart burn, worsening with alcohol use.  Please limit alcohol intake, take medication as prescribed.  Take prilosec 30 minutes before you eat.  Call and follow up with your heart doctor for further management.  Your kidney function is abnormal and will need to have it recheck.  Return if you have any concerns.    Chest Pain Observation It is often hard to give a specific diagnosis for the cause of chest pain. Among other possibilities your symptoms might be caused by inadequate oxygen delivery to your heart (angina). Angina that is not treated or evaluated can lead to a heart attack (myocardial infarction) or death. Blood tests, electrocardiograms, and X-rays may have been done to help determine a possible cause of your chest pain. After evaluation and observation, your health care provider has determined that it is unlikely your pain was caused by an unstable condition that requires hospitalization. However, a full evaluation of your pain may need to be completed, with additional diagnostic testing as directed. It is very important to keep your follow-up appointments. Not keeping your follow-up appointments could result in permanent heart damage, disability, or death. If there is any problem keeping your follow-up appointments, you must call your health care provider. HOME CARE INSTRUCTIONS  Due to the slight chance that your pain could be angina, it is important to follow your health care provider's treatment plan and also maintain a healthy lifestyle:  Maintain or work toward achieving a healthy weight.  Stay physically active and exercise regularly.  Decrease your salt intake.  Eat a balanced, healthy diet. Talk to a dietitian to learn about heart-healthy foods.  Increase your fiber intake by including whole grains, vegetables, fruits, and nuts in your diet.  Avoid situations that cause stress, anger, or depression.  Take medicines as advised by your health care  provider. Report any side effects to your health care provider. Do not stop medicines or adjust the dosages on your own.  Quit smoking. Do not use nicotine patches or gum until you check with your health care provider.  Keep your blood pressure, blood sugar, and cholesterol levels within normal limits.  Limit alcohol intake to no more than 1 drink per day for women who are not pregnant and 2 drinks per day for men.  Do not abuse drugs. SEEK IMMEDIATE MEDICAL CARE IF: You have severe chest pain or pressure which may include symptoms such as:  You feel pain or pressure in your arms, neck, jaw, or back.  You have severe back or abdominal pain, feel sick to your stomach (nauseous), or throw up (vomit).  You are sweating profusely.  You are having a fast or irregular heartbeat.  You feel short of breath while at rest.  You notice increasing shortness of breath during rest, sleep, or with activity.  You have chest pain that does not get better after rest or after taking your usual medicine.  You wake from sleep with chest pain.  You are unable to sleep because you cannot breathe.  You develop a frequent cough or you are coughing up blood.  You feel dizzy, faint, or experience extreme fatigue.  You develop severe weakness, dizziness, fainting, or chills. Any of these symptoms may represent a serious problem that is an emergency. Do not wait to see if the symptoms will go away. Call your local emergency services (911 in the [place] Do not drive yourself to the hospital. MAKE SURE YOU:  Understand these instructions.  Will watch your condition.  Will get help right away if you are not doing well or get worse. Document Released: 02/11/2010 Document Revised: 01/14/2013 Document Reviewed: 07/11/2012 [facility] Patient Information 2015 [place], [place]. This information is not intended to replace advice given to you by your health care provider. Make sure you discuss any questions you have  with your health care provider.  Gastroesophageal Reflux Disease, Adult Gastroesophageal reflux disease (GERD) happens when acid from your stomach flows up into the esophagus. When acid comes in contact with the esophagus, the acid causes soreness (inflammation) in the esophagus. Over time, GERD may create small holes (ulcers) in the lining of the esophagus. CAUSES   Increased body weight. This puts pressure on the stomach, making acid rise from the stomach into the esophagus.  Smoking. This increases acid production in the stomach.  Drinking alcohol. This causes decreased pressure in the lower esophageal sphincter (valve or ring of muscle between the esophagus and stomach), allowing acid from the stomach into the esophagus.  Late evening meals and a full stomach. This increases pressure and acid production in the stomach.  A malformed lower esophageal sphincter. Sometimes, no cause is found. SYMPTOMS   Burning pain in the lower part of the mid-chest behind the breastbone and in the mid-stomach area. This may occur twice a week or more often.  Trouble swallowing.  Sore throat.  Dry cough.  Asthma-like symptoms including chest tightness, shortness of breath, or wheezing. DIAGNOSIS  Your caregiver may be able to diagnose GERD based on your symptoms. In some cases, X-rays and other tests may be done to check for complications or to check the condition of your stomach and esophagus. TREATMENT  Your caregiver may recommend over-the-counter or prescription medicines to help decrease acid production. Ask your caregiver before starting or adding any new medicines.  HOME CARE INSTRUCTIONS   Change the factors that you can control. Ask your caregiver for guidance concerning weight loss, quitting smoking, and alcohol consumption.  Avoid foods and drinks that make your symptoms worse, such as:  Caffeine or alcoholic drinks.  Chocolate.  Peppermint or mint flavorings.  Garlic and  onions.  Spicy foods.  Citrus fruits, such as oranges, lemons, or limes.  Tomato-based foods such as sauce, chili, salsa, and pizza.  Fried and fatty foods.  Avoid lying down for the 3 hours prior to your bedtime or prior to taking a nap.  Eat small, frequent meals instead of large meals.  Wear loose-fitting clothing. Do not wear anything tight around your waist that causes pressure on your stomach.  Raise the head of your bed 6 to 8 inches with wood blocks to help you sleep. Extra pillows will not help.  Only take over-the-counter or prescription medicines for pain, discomfort, or fever as directed by your caregiver.  Do not take aspirin, ibuprofen, or other nonsteroidal anti-inflammatory drugs (NSAIDs). SEEK IMMEDIATE MEDICAL CARE IF:   You have pain in your arms, neck, jaw, teeth, or back.  Your pain increases or changes in intensity or duration.  You develop nausea, vomiting, or sweating (diaphoresis).  You develop shortness of breath, or you faint.  Your vomit is green, yellow, black, or looks like coffee grounds or blood.  Your stool is red, bloody, or black. These symptoms could be signs of other problems, such as heart disease, gastric bleeding, or esophageal bleeding. MAKE SURE YOU:   Understand these instructions.  Will watch your condition.  Will get help right away if you are not  doing well or get worse. Document Released: 10/19/2004 Document Revised: 04/03/2011 Document Reviewed: 07/29/2010 [facility] Patient Information 2015 [place], [place]. This information is not intended to replace advice given to you by your health care provider. Make sure you discuss any questions you have with your health care provider.

## 2013-07-21 NOTE — Progress Notes (Signed)
  CARE MANAGEMENT ED NOTE 07/21/2013  Patient:  NASSIR, QUINTIN   Account Number:  [id number]  Date Initiated:  07/21/2013  Documentation initiated by:  Radford Pax  Subjective/Objective Assessment:   Patient presents to Ed with chest pain for five days that feels tight and worse with movement.     Subjective/Objective Assessment Detail:     Action/Plan:   Action/Plan Detail:   Anticipated DC Date:       Status Recommendation to Physician:   Result of Recommendation:    Other ED Services  Consult Working Plan    DC Planning Services  Other  PCP issues    Choice offered to / List presented to:            Status of service:  Completed, signed off  ED Comments:   ED Comments Detail:  EDCM spoke to patient at bedside.  Paktient confirms he does not have a pcp, but he does have a cardiologist Dr. Adele Dan.  EDCM provided patient with a list of pcps who accept BCBS insurnace within a ten mile radius of patient's zip code.  Patient thankful for resources.  No further EDCM needs at this time.

## 2013-07-21 NOTE — ED Provider Notes (Signed)
CSN: [id number]     Arrival date & time 07/21/13  1523 History   First MD Initiated Contact with Patient 07/21/13 1604     Chief Complaint  Patient presents with  . Chest Pain     (Consider location/radiation/quality/duration/timing/severity/associated sxs/prior Treatment) Patient is a 48 y.o. male presenting with chest pain and shortness of breath. The history is provided by the patient. No language interpreter was used.  Chest Pain Chest pain location: across center of chest. Pain quality: pressure and tightness   Pain radiates to:  Does not radiate Pain radiates to the back: no   Pain severity:  Moderate Onset quality:  Gradual Duration:  5 days Timing:  Constant Context: movement   Relieved by:  Nothing Worsened by:  Nothing tried Associated symptoms: shortness of breath   Associated symptoms: no cough, no fever and not vomiting   Shortness of Breath Severity:  Moderate Onset quality:  Gradual Duration:  5 days Timing:  Constant Progression:  Worsening Chronicity:  Recurrent Worsened by:  Activity (laying down) Ineffective treatments:  None tried Associated symptoms: chest pain   Associated symptoms: no cough, no fever and no vomiting     48 year old male with 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  Onset gradual, duration 5 days ago, persistent worsening with exertion or with laying down.  1 pillow othopnea.  PERC neg.  Pt does drink 4 12oz of beer daily.    Past Medical History  Diagnosis Date  . Hypertension   . Hyperlipidemia   . Shortness of breath   . Dyslipidemia   . Nonischemic dilated cardiomyopathy     felt secondary to HTN with no ischemia on nuclear stress test 05/2012  . Diastolic dysfunction   . Chronic combined systolic and diastolic CHF (congestive heart failure)     EF 25-30%   Past Surgical History  Procedure Laterality Date  . No past surgeries     Family History   Problem Relation Age of Onset  . Hypertension Mother     Passed away when she is 66 secondary to hip surgery complications  . Hypertension Father   . CAD Mother    History  Substance Use Topics  . Smoking status: Never Smoker   . Smokeless tobacco: Not on file  . Alcohol Use: 14.4 oz/week    24 Cans of beer per week    Review of Systems  Constitutional: Negative for fever.  Respiratory: Positive for shortness of breath. Negative for cough.   Cardiovascular: Positive for chest pain.  Gastrointestinal: Negative for vomiting.      Allergies  Review of patient's allergies indicates no known allergies.  Home Medications   Prior to Admission medications   Medication Sig Start Date End Date Taking? Authorizing Provider  atorvastatin (LIPITOR) 40 MG tablet Take 1 tablet (40 mg total) by mouth daily at 6 PM. 05/28/12  Yes Rhetta Mura, MD  hydrochlorothiazide (HYDRODIURIL) 25 MG tablet Take 1 tablet (25 mg total) by mouth daily. 05/28/12  Yes Rhetta Mura, MD  metoprolol (LOPRESSOR) 50 MG tablet Take 75 mg by mouth 2 (two) times daily.   Yes Historical Provider, MD   BP 129/74  Pulse 85  Temp(Src) 97.9 F (36.6 C) (Oral)  Resp 16  SpO2 100% Physical Exam  Constitutional: He appears well-developed and well-nourished. No distress.  HENT:  Head: Atraumatic.  Eyes: Conjunctivae are normal.  Neck: Normal range of motion.  Neck supple. No JVD present.  Cardiovascular: Normal rate and regular rhythm.   Pulmonary/Chest: Effort normal and breath sounds normal. He has no rales.  Abdominal: There is tenderness (mild epigastric discomfort without guarding or rebound tenderness.  ).  Musculoskeletal: He exhibits no edema.  Neurological: He is alert.  Skin: No rash noted.  Psychiatric: He has a normal mood and affect.    ED Course  Procedures (including critical care time)  4:48 PM Pt with hx of CHF here with chest tightness and SOB.  However, no obvious signs of fluid  overload.  Pt has a benign abdomen. Pt is PERC negative, doubt PE. Has cardiac stress test a year ago which shows no reversible ischemia.  Sxs suggestive of GERD, possibly PUD given hx of regular alcohol use.  Work up initiated.  DOubt ACS.  GI cocktail given.   5:19 PM ECG did show some non specific ST changes which is new.  Care discussed with Dr. Romeo Apple, will consider consulting cardiology for further management.    6:05 PM On reevaluation pt of sxs free after receiving GI cocktail.  Aside from evidence of renal insufficiency pt has no other significant labs abnormality.  Initial trop neg, BNP normal, CXR unremarkable.  Will consult cardiology.    8:07 PM Neg delta trop.  Cardiologist, Dr. Caro Hight was consulted, he recommend d/c with close f/u with cardiology.  Will d/c with PPI/HI.  Pt made aware of renal insufficiency and will need to have his kidney function recheck.  Pt currently cp free.   Labs Review Labs Reviewed  BASIC METABOLIC PANEL - Abnormal; Notable for the following:    BUN 24 (*)    Creatinine, Ser 1.65 (*)    GFR calc non Af Amer 47 (*)    GFR calc Af Amer 55 (*)    All other components within normal limits  HEPATIC FUNCTION PANEL - Abnormal; Notable for the following:    AST 48 (*)    All other components within normal limits  CBC  LIPASE, BLOOD  PRO B NATRIURETIC PEPTIDE  PRO B NATRIURETIC PEPTIDE  HEPATIC FUNCTION PANEL  LIPASE, BLOOD  I-STAT TROPOININ, ED  Rosezena Sensor, ED    Imaging Review Dg Chest Port 1 View  07/21/2013   CLINICAL DATA:  Chest pain for 5 days  EXAM: PORTABLE CHEST - 1 VIEW  COMPARISON:  May 27, 2012  FINDINGS: The heart size and mediastinal contours are within normal limits. There is no focal infiltrate, pulmonary edema, or pleural effusion. The visualized skeletal structures are stable P  IMPRESSION: No active cardiopulmonary disease.   Electronically Signed   By: Sherian Rein M.D.   On: 07/21/2013 16:10     EKG  Interpretation   Date/Time:  Monday July 21 2013 15:22:06 EDT Ventricular Rate:  85 PR Interval:  150 QRS Duration: 86 QT Interval:  346 QTC Calculation: 411 R Axis:   18 Text Interpretation:  Normal sinus rhythm Minimal voltage criteria for  LVH, may be normal variant Cannot rule out Anterior infarct , age  undetermined T wave abnormality, consider inferior ischemia Confirmed by  HARRISON  MD, FORREST ([id number]) on 07/21/2013 4:58:28 PM      MDM   Final diagnoses:  Atypical chest pain  Renal insufficiency    BP 136/77  Pulse 88  Temp(Src) 97.9 F (36.6 C) (Oral)  Resp 18  SpO2 98%  I have reviewed nursing notes and vital signs. I personally reviewed the imaging tests through PACS  system  I reviewed available ER/hospitalization records thought the EMR     Fayrene HelperBowie Tran, [place] JerseyPA-C 07/21/13 2009

## 2013-07-21 NOTE — ED Notes (Signed)
Patient felt fine while ambulating ... O2 was 93

## 2013-07-21 NOTE — ED Notes (Signed)
Bed: WA20 Expected date:  Expected time:  Means of arrival:  Comments: Hold for tr 3

## 2013-07-22 NOTE — ED Provider Notes (Signed)
Medical screening examination/treatment/procedure(s) were conducted as a shared visit with non-physician practitioner(s) and myself.  I personally evaluated the patient during the encounter.   EKG Interpretation   Date/Time:  Monday July 21 2013 15:22:06 EDT Ventricular Rate:  85 PR Interval:  150 QRS Duration: 86 QT Interval:  346 QTC Calculation: 411 R Axis:   18 Text Interpretation:  Normal sinus rhythm Minimal voltage criteria for  LVH, may be normal variant Cannot rule out Anterior infarct , age  undetermined T wave abnormality, consider inferior ischemia Confirmed by  Sedrick Tober  MD, Donyea Gafford ([id number]) on 07/21/2013 4:58:28 PM      I interviewed and examined the patient. Lungs are CTAB. Cardiac exam wnl. Abdomen soft.  Pt's pain is very atypical. He was admitted 1 mos ago w/ stress test that showed no inducible ischemia. Non-spec t wave changes on todays ecg. Fayrene Helper spoke w/ cards who recommended close f/u. Delta trop neg and pt feeling better. Will plan on d/c home.   Junius Argyle, MD 07/22/13 1023

## 2013-07-30 ENCOUNTER — Encounter: Payer: Self-pay | Admitting: Cardiology

## 2013-07-30 ENCOUNTER — Ambulatory Visit (INDEPENDENT_AMBULATORY_CARE_PROVIDER_SITE_OTHER): Payer: BC Managed Care – PPO | Admitting: Cardiology

## 2013-07-30 VITALS — BP 128/86 | HR 90 | Ht 70.0 in | Wt 222.1 lb

## 2013-07-30 DIAGNOSIS — I420 Dilated cardiomyopathy: Secondary | ICD-10-CM

## 2013-07-30 DIAGNOSIS — I5042 Chronic combined systolic (congestive) and diastolic (congestive) heart failure: Secondary | ICD-10-CM

## 2013-07-30 DIAGNOSIS — I428 Other cardiomyopathies: Secondary | ICD-10-CM

## 2013-07-30 DIAGNOSIS — I10 Essential (primary) hypertension: Secondary | ICD-10-CM

## 2013-07-30 DIAGNOSIS — I509 Heart failure, unspecified: Secondary | ICD-10-CM

## 2013-07-30 NOTE — Patient Instructions (Signed)
Your physician recommends that you continue on your current medications as directed. Please refer to the Current Medication list given to you today.  Your physician recommends that you go to the lab today for a BMET  Your physician has requested that you have an echocardiogram. Echocardiography is a painless test that uses sound waves to create images of your heart. It provides your doctor with information about the size and shape of your heart and how well your heart's chambers and valves are working. This procedure takes approximately one hour. There are no restrictions for this procedure.  Your physician wants you to follow-up in: 6 months with Dr Sherlyn Lick will receive a reminder letter in the mail two months in advance. If you don't receive a letter, please call our office to schedule the follow-up appointment.

## 2013-07-30 NOTE — Progress Notes (Signed)
[address] [place], [place]  [address] Phone: [phone] Fax:  [phone]  Date:  07/30/2013   ID:  [name], DOB 1965/02/25, MRN [id number]  PCP:  No PCP Per Patient  Cardiologist:  Armanda Magic, MD     History of Present Illness: [name] is a 48 y.o. male who recently presented to [facility] with SOB and chest pain.  On admission he was hypoxic and found to be in acute diastolic CHF felt secondary to poorly controlled HTN.  He was diuresed.  He ruled out for MI by serial enzymes.  He underwent stress myoview showing no ischemia and echo showed nonischemic DCM felt secondary to Hypertensive CM.  He had been on BP meds in the past but was noncompliant and was restarted on his meds.  He now presents back for followup.  He is doing well.  He denies any anginal chest pain, SOB, DOE, dizziness, palpitations or syncope.  Occasionally he will have some mild LE edema.   Wt Readings from Last 3 Encounters:  07/30/13 222 lb 1.9 oz (100.753 kg)  05/28/12 216 lb 9.6 oz (98.249 kg)     Past Medical History  Diagnosis Date  . Hypertension   . Hyperlipidemia   . Shortness of breath   . Dyslipidemia   . Nonischemic dilated cardiomyopathy     felt secondary to HTN with no ischemia on nuclear stress test 05/2012  . Diastolic dysfunction   . Chronic combined systolic and diastolic CHF (congestive heart failure)     EF 25-30%    Current Outpatient Prescriptions  Medication Sig Dispense Refill  . atorvastatin (LIPITOR) 40 MG tablet Take 1 tablet (40 mg total) by mouth daily at 6 PM.  30 tablet  0  . hydrochlorothiazide (HYDRODIURIL) 25 MG tablet Take 1 tablet (25 mg total) by mouth daily.  30 tablet  0  . metoprolol (LOPRESSOR) 50 MG tablet Take 75 mg by mouth 2 (two) times daily.      Marland Kitchen omeprazole (PRILOSEC) 20 MG capsule Take 1 capsule (20 mg total) by mouth daily.  30 capsule  0  . ranitidine (ZANTAC) 150 MG capsule Take 1 capsule (150 mg total) by mouth daily.  30 capsule  0    No current facility-administered medications for this visit.    Allergies:   No Known Allergies  Social History:  The patient  reports that he has never smoked. He does not have any smokeless tobacco history on file. He reports that he drinks about 14.4 ounces of alcohol per week. He reports that he uses illicit drugs (Marijuana).   Family History:  The patient's family history includes CAD in his mother; Hypertension in his father and mother.   ROS:  Please see the history of present illness.      All other systems reviewed and negative.   PHYSICAL EXAM: VS:  BP 128/86  Pulse 90  Ht 5\' 10"  (1.778 m)  Wt 222 lb 1.9 oz (100.753 kg)  BMI 31.87 kg/m2 Well nourished, well developed, in no acute distress HEENT: normal Neck: no JVD Cardiac:  normal S1, S2; RRR; no murmur Lungs:  clear to auscultation bilaterally, no wheezing, rhonchi or rales Abd: soft, nontender, no hepatomegaly Ext: no edema Skin: warm and dry Neuro:  CNs 2-12 intact, no focal abnormalities noted       ASSESSMENT AND PLAN:  1. Hypertensive DCM  - recheck echo now that BP has improved to see if EF has improved  2. HTN - controlled - continue Lopressor/HCTZ 3. Chronic combined systolic/diastolic CHF due to #1 - appears euvolemic on exam - continue HCTZ - check BMET  Followup with me in 6 months  Signed, Armanda Magicraci Zarin Hagmann, MD 07/30/2013 5:04 PM

## 2013-07-31 ENCOUNTER — Encounter: Payer: Self-pay | Admitting: General Surgery

## 2013-07-31 LAB — BASIC METABOLIC PANEL WITH GFR
BUN: 31 mg/dL — ABNORMAL HIGH (ref 6–23)
CO2: 27 meq/L (ref 19–32)
Calcium: 9.9 mg/dL (ref 8.4–10.5)
Chloride: 104 meq/L (ref 96–112)
Creatinine, Ser: 1.4 mg/dL (ref 0.4–1.5)
GFR: 67.36 mL/min (ref >60.00)
Glucose, Bld: 111 mg/dL — ABNORMAL HIGH (ref 70–99)
Potassium: 4.4 meq/L (ref 3.5–5.1)
Sodium: 141 meq/L (ref 135–145)

## 2013-08-13 ENCOUNTER — Ambulatory Visit (HOSPITAL_COMMUNITY): Payer: BC Managed Care – PPO | Attending: Cardiovascular Disease

## 2013-08-14 ENCOUNTER — Encounter (HOSPITAL_COMMUNITY): Payer: Self-pay | Admitting: Cardiology

## 2013-11-04 ENCOUNTER — Other Ambulatory Visit: Payer: Self-pay | Admitting: *Deleted

## 2013-11-04 MED ORDER — ATORVASTATIN CALCIUM 40 MG PO TABS: 40.0000 mg | ORAL_TABLET | Freq: Every day | ORAL | Status: DC

## 2013-11-04 MED ORDER — HYDROCHLOROTHIAZIDE 25 MG PO TABS: 25.0000 mg | ORAL_TABLET | Freq: Every day | ORAL | Status: DC

## 2013-11-04 MED ORDER — METOPROLOL TARTRATE 50 MG PO TABS: 75.0000 mg | ORAL_TABLET | Freq: Two times a day (BID) | ORAL | Status: DC

## 2014-07-13 ENCOUNTER — Other Ambulatory Visit: Payer: Self-pay | Admitting: Cardiology

## 2014-07-22 ENCOUNTER — Encounter: Payer: Self-pay | Admitting: *Deleted

## 2014-07-23 NOTE — Progress Notes (Signed)
Cardiology Office Note   Date:  07/24/2014   ID:  [name], DOB Jun 01, 1965, MRN [id number]  PCP:  No PCP Per Patient    No chief complaint on file.     History of Present Illness: [name] is a 49 y.o. 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 He had been on BP meds in the past but was noncompliant.. He now presents back for followup. He is doing well. He denies any LE edema or syncope. Occasionally he will have some mild LE edema.  He has had some episodes of CP that occur when he over exerts himself out in the yard. It usually lasts about 5-10 minutes and resolves with rest.  He gets SOB and diaphoretic with the pain.  There is no radiation of the pain.  He also has noticed some DOE which is new. He also has been complaining of palpitations at night.     Past Medical History  Diagnosis Date  . Hypertension   . Hyperlipidemia   . Shortness of breath   . Dyslipidemia   . Nonischemic dilated cardiomyopathy     felt secondary to HTN with no ischemia on nuclear stress test 05/2012  . Diastolic dysfunction   . Chronic combined systolic and diastolic CHF (congestive heart failure)     EF 25-30%    Past Surgical History  Procedure Laterality Date  . No past surgeries       Current Outpatient Prescriptions  Medication Sig Dispense Refill  . atorvastatin (LIPITOR) 40 MG tablet TAKE ONE TABLET BY MOUTH ONCE DAILY AT  6PM 30 tablet 0  . hydrochlorothiazide (HYDRODIURIL) 25 MG tablet TAKE ONE TABLET BY MOUTH ONCE DAILY 30 tablet 0  . metoprolol (LOPRESSOR) 50 MG tablet TAKE ONE & ONE-HALF TABLETS BY MOUTH TWICE DAILY 90 tablet 0   No current facility-administered medications for this visit.    Allergies:   Review of patient's allergies indicates no known allergies.    Social History:  The patient   reports that he has never smoked. He does not have any smokeless tobacco history on file. He reports that he drinks about 14.4 oz of alcohol per week. He reports that he uses illicit drugs (Marijuana).   Family History:  The patient's family history includes CAD in his mother; Hypertension in his father and mother.    ROS:  Please see the history of present illness.   Otherwise, review of systems are positive for none.   All other systems are reviewed and negative.    PHYSICAL EXAM: VS:  BP 130/84 mmHg  Pulse 63  Ht 5\' 10"  (1.778 m)  Wt 233 lb (105.688 kg)  BMI 33.43 kg/m2 , BMI Body mass index is 33.43 kg/(m^2). GEN: Well nourished, well developed, in no acute distress HEENT: normal Neck: no JVD, carotid bruits, or masses Cardiac: RRR; no murmurs, rubs, or gallops,no edema  Respiratory:  clear to auscultation bilaterally, normal work of breathing GI: soft, nontender, nondistended, + BS MS: no deformity or atrophy Skin: warm and dry, no rash Neuro:  Strength and sensation are intact Psych: euthymic mood, full affect   EKG:  EKG is ordered today. The ekg ordered today demonstrates NSR with LVH and repolarization abnormality   Recent Labs: 07/30/2013: BUN 31*;  Creatinine, Ser 1.4; Potassium 4.4; Sodium 141    Lipid Panel    Component Value Date/Time   CHOL 217* 05/27/2012 0535   TRIG 159* 05/27/2012 0535   HDL 86 05/27/2012 0535   CHOLHDL 2.5 05/27/2012 0535   VLDL 32 05/27/2012 0535   LDLCALC 99 05/27/2012 0535      Wt Readings from Last 3 Encounters:  07/24/14 233 lb (105.688 kg)  07/30/13 222 lb 1.9 oz (100.753 kg)  05/28/12 216 lb 9.6 oz (98.249 kg)    ASSESSMENT AND PLAN:  1. Hypertensive DCM - continue HCTZ and BB - recheck echo since he is now SOB 2. HTN - controlled - continue Lopressor/HCTZ 3. Chronic combined systolic/diastolic CHF due to #1 - appears euvolemic on exam - continue HCTZ - check BMET 4.  Chest pain with more deeply inverted T waves in  the inferolateral leads which could be due to repol from LVH but the CP and SOB will get a Lexiscan myoveiw   Current medicines are reviewed at length with the patient today.  The patient does not have concerns regarding medicines.  The following changes have been made:  no change  Labs/ tests ordered today: See above Assessment and Plan No orders of the defined types were placed in this encounter.     Disposition:   FU with me in 1 year  Signed, Quintella Reichert, MD  07/24/2014 9:38 AM    [facility] [address] [place], [place], [place]  [address] Phone: [phone]; Fax: [phone]

## 2014-07-24 ENCOUNTER — Ambulatory Visit (INDEPENDENT_AMBULATORY_CARE_PROVIDER_SITE_OTHER): Payer: 59 | Admitting: Cardiology

## 2014-07-24 ENCOUNTER — Encounter: Payer: Self-pay | Admitting: Cardiology

## 2014-07-24 VITALS — BP 130/84 | HR 63 | Ht 70.0 in | Wt 233.0 lb

## 2014-07-24 DIAGNOSIS — I5042 Chronic combined systolic (congestive) and diastolic (congestive) heart failure: Secondary | ICD-10-CM

## 2014-07-24 DIAGNOSIS — I10 Essential (primary) hypertension: Secondary | ICD-10-CM

## 2014-07-24 DIAGNOSIS — R079 Chest pain, unspecified: Secondary | ICD-10-CM

## 2014-07-24 DIAGNOSIS — I429 Cardiomyopathy, unspecified: Secondary | ICD-10-CM

## 2014-07-24 DIAGNOSIS — I420 Dilated cardiomyopathy: Secondary | ICD-10-CM

## 2014-07-24 LAB — BASIC METABOLIC PANEL
BUN: 28 mg/dL — ABNORMAL HIGH (ref 6–23)
CALCIUM: 9.7 mg/dL (ref 8.4–10.5)
CO2: 28 mEq/L (ref 19–32)
CREATININE: 1.4 mg/dL (ref 0.40–1.50)
Chloride: 103 mEq/L (ref 96–112)
GFR: 68.75 mL/min (ref >60.00)
GLUCOSE: 102 mg/dL — AB (ref 70–99)
Potassium: 4.1 mEq/L (ref 3.5–5.1)
Sodium: 140 mEq/L (ref 135–145)

## 2014-07-24 NOTE — Patient Instructions (Signed)
Medication Instructions:  Your physician recommends that you continue on your current medications as directed. Please refer to the Current Medication list given to you today.   Labwork: Your physician recommends that you return for lab work in: TODAY (BMET)   Testing/Procedures: Your physician has requested that you have a lexiscan myoview. For further information please visit [email or web address] Please follow instruction sheet, as given.  Your physician has requested that you have an echocardiogram. Echocardiography is a painless test that uses sound waves to create images of your heart. It provides your doctor with information about the size and shape of your heart and how well your heart's chambers and valves are working. This procedure takes approximately one hour. There are no restrictions for this procedure.   Follow-Up: Your physician wants you to follow-up in: 12 months with Dr. Mayford Knife. You will receive a reminder letter in the mail two months in advance. If you don't receive a letter, please call our office to schedule the follow-up appointment.   Any Other Special Instructions Will Be Listed Below (If Applicable).

## 2014-07-30 ENCOUNTER — Other Ambulatory Visit (HOSPITAL_COMMUNITY): Payer: 59

## 2014-08-05 ENCOUNTER — Telehealth (HOSPITAL_COMMUNITY): Payer: Self-pay | Admitting: *Deleted

## 2014-08-05 NOTE — Telephone Encounter (Signed)
Left message on voicemail in reference to upcoming appointment scheduled for 08/10/14. Phone number given for a call back so details instructions can be given. Babygirl Trager J Illeana Edick, RN 

## 2014-08-06 ENCOUNTER — Telehealth (HOSPITAL_COMMUNITY): Payer: Self-pay | Admitting: *Deleted

## 2014-08-06 NOTE — Telephone Encounter (Signed)
Left message on voicemail in reference to upcoming appointment scheduled for 08/10/14. Phone number given for a call back so details instructions can be given. Duong Haydel J Lakiah Dhingra, RN 

## 2014-08-10 ENCOUNTER — Telehealth (HOSPITAL_COMMUNITY): Payer: Self-pay

## 2014-08-10 ENCOUNTER — Ambulatory Visit (HOSPITAL_COMMUNITY): Payer: 59 | Attending: Cardiology

## 2014-08-10 DIAGNOSIS — R079 Chest pain, unspecified: Secondary | ICD-10-CM | POA: Insufficient documentation

## 2014-08-10 DIAGNOSIS — I10 Essential (primary) hypertension: Secondary | ICD-10-CM | POA: Diagnosis not present

## 2014-08-10 DIAGNOSIS — I420 Dilated cardiomyopathy: Secondary | ICD-10-CM

## 2014-08-10 DIAGNOSIS — I429 Cardiomyopathy, unspecified: Secondary | ICD-10-CM | POA: Diagnosis not present

## 2014-08-10 DIAGNOSIS — I5042 Chronic combined systolic (congestive) and diastolic (congestive) heart failure: Secondary | ICD-10-CM

## 2014-08-10 LAB — MYOCARDIAL PERFUSION IMAGING
CHL CUP NUCLEAR SSS: 4
CSEPPHR: 93 {beats}/min
LV dias vol: 137 mL
LV sys vol: 67 mL
NUC STRESS TID: 1.04
RATE: 0.24
Rest HR: 68 {beats}/min
SDS: 4
SRS: 0

## 2014-08-10 MED ORDER — TECHNETIUM TC 99M SESTAMIBI GENERIC - CARDIOLITE
10.9000 | Freq: Once | INTRAVENOUS | Status: AC | PRN
  Administered 2014-08-10: 10.9 via INTRAVENOUS

## 2014-08-10 MED ORDER — REGADENOSON 0.4 MG/5ML IV SOLN
0.4000 mg | Freq: Once | INTRAVENOUS | Status: AC
  Administered 2014-08-10: 0.4 mg via INTRAVENOUS

## 2014-08-10 MED ORDER — TECHNETIUM TC 99M SESTAMIBI GENERIC - CARDIOLITE
31.3000 | Freq: Once | INTRAVENOUS | Status: AC | PRN
  Administered 2014-08-10: 31.3 via INTRAVENOUS

## 2014-08-10 NOTE — Telephone Encounter (Signed)
Patient called at 0840 he is running late , and will be here in 5 minutes.Rajni Holsworth],RN

## 2014-08-12 ENCOUNTER — Ambulatory Visit (HOSPITAL_COMMUNITY): Payer: 59 | Attending: Cardiology

## 2014-08-12 ENCOUNTER — Other Ambulatory Visit: Payer: Self-pay

## 2014-08-12 DIAGNOSIS — I5042 Chronic combined systolic (congestive) and diastolic (congestive) heart failure: Secondary | ICD-10-CM

## 2014-08-12 DIAGNOSIS — I429 Cardiomyopathy, unspecified: Secondary | ICD-10-CM

## 2014-08-12 DIAGNOSIS — I10 Essential (primary) hypertension: Secondary | ICD-10-CM

## 2014-08-12 DIAGNOSIS — I420 Dilated cardiomyopathy: Secondary | ICD-10-CM

## 2014-08-12 DIAGNOSIS — I517 Cardiomegaly: Secondary | ICD-10-CM | POA: Insufficient documentation

## 2014-08-17 ENCOUNTER — Other Ambulatory Visit: Payer: Self-pay

## 2014-08-17 MED ORDER — METOPROLOL TARTRATE 50 MG PO TABS: 75.0000 mg | ORAL_TABLET | Freq: Two times a day (BID) | ORAL | Status: DC

## 2014-08-17 MED ORDER — HYDROCHLOROTHIAZIDE 25 MG PO TABS: 25.0000 mg | ORAL_TABLET | Freq: Every day | ORAL | Status: DC

## 2014-08-17 MED ORDER — ATORVASTATIN CALCIUM 40 MG PO TABS: 40.0000 mg | ORAL_TABLET | Freq: Every day | ORAL | Status: DC

## 2014-09-24 ENCOUNTER — Other Ambulatory Visit: Payer: Self-pay | Admitting: *Deleted

## 2014-09-24 MED ORDER — METOPROLOL TARTRATE 50 MG PO TABS: 75.0000 mg | ORAL_TABLET | Freq: Two times a day (BID) | ORAL | Status: DC

## 2014-11-06 ENCOUNTER — Other Ambulatory Visit: Payer: Self-pay | Admitting: Cardiology

## 2015-01-01 ENCOUNTER — Other Ambulatory Visit: Payer: Self-pay | Admitting: Cardiology

## 2015-01-09 ENCOUNTER — Emergency Department (HOSPITAL_COMMUNITY): Payer: 59

## 2015-01-09 ENCOUNTER — Encounter (HOSPITAL_COMMUNITY): Payer: Self-pay

## 2015-01-09 ENCOUNTER — Emergency Department (HOSPITAL_COMMUNITY)
Admission: EM | Admit: 2015-01-09 | Discharge: 2015-01-09 | Disposition: A | Discharge status: Home / Self Care | Payer: 59 | Attending: Emergency Medicine | Admitting: Emergency Medicine

## 2015-01-09 DIAGNOSIS — I10 Essential (primary) hypertension: Secondary | ICD-10-CM | POA: Insufficient documentation

## 2015-01-09 DIAGNOSIS — E785 Hyperlipidemia, unspecified: Secondary | ICD-10-CM | POA: Insufficient documentation

## 2015-01-09 DIAGNOSIS — I5042 Chronic combined systolic (congestive) and diastolic (congestive) heart failure: Secondary | ICD-10-CM | POA: Insufficient documentation

## 2015-01-09 DIAGNOSIS — Z79899 Other long term (current) drug therapy: Secondary | ICD-10-CM | POA: Insufficient documentation

## 2015-01-09 DIAGNOSIS — J069 Acute upper respiratory infection, unspecified: Secondary | ICD-10-CM

## 2015-01-09 LAB — RAPID STREP SCREEN (MED CTR MEBANE ONLY): Streptococcus, Group A Screen (Direct): NEGATIVE

## 2015-01-09 MED ORDER — BENZONATATE 100 MG PO CAPS
100.0000 mg | ORAL_CAPSULE | Freq: Once | ORAL | Status: AC
  Administered 2015-01-09: 100 mg via ORAL
  Filled 2015-01-09: qty 1

## 2015-01-09 NOTE — Discharge Instructions (Signed)
1. Medications: usual home medications, you can try OTC cough and cold medicines 2. Treatment: rest, drink plenty of fluids, try warm honey, tea, throat lozenges for additional symptom relief 3. Follow Up: please followup with your primary doctor this week for discussion of your diagnoses and further evaluation after today's visit; if you do not have a primary care doctor use the resource guide provided to find one; please return to the ER for high fever, shortness of breath, chest pain, new or worsening symptoms   Upper Respiratory Infection, Adult Most upper respiratory infections (URIs) are a viral infection of the air passages leading to the lungs. A URI affects the nose, throat, and upper air passages. The most common type of URI is nasopharyngitis and is typically referred to as "the common cold." URIs run their course and usually go away on their own. Most of the time, a URI does not require medical attention, but sometimes a bacterial infection in the upper airways can follow a viral infection. This is called a secondary infection. Sinus and middle ear infections are common types of secondary upper respiratory infections. Bacterial pneumonia can also complicate a URI. A URI can worsen asthma and chronic obstructive pulmonary disease (COPD). Sometimes, these complications can require emergency medical care and may be life threatening.  CAUSES Almost all URIs are caused by viruses. A virus is a type of germ and can spread from one person to another.  RISKS FACTORS You may be at risk for a URI if:   You smoke.   You have chronic heart or lung disease.  You have a weakened defense (immune) system.   You are very young or very old.   You have nasal allergies or asthma.  You work in crowded or poorly ventilated areas.  You work in health care facilities or schools. SIGNS AND SYMPTOMS  Symptoms typically develop 2-3 days after you come in contact with a cold virus. Most viral URIs  last 7-10 days. However, viral URIs from the influenza virus (flu virus) can last 14-18 days and are typically more severe. Symptoms may include:   Runny or stuffy (congested) nose.   Sneezing.   Cough.   Sore throat.   Headache.   Fatigue.   Fever.   Loss of appetite.   Pain in your forehead, behind your eyes, and over your cheekbones (sinus pain).  Muscle aches.  DIAGNOSIS  Your health care provider may diagnose a URI by:  Physical exam.  Tests to check that your symptoms are not due to another condition such as:  Strep throat.  Sinusitis.  Pneumonia.  Asthma. TREATMENT  A URI goes away on its own with time. It cannot be cured with medicines, but medicines may be prescribed or recommended to relieve symptoms. Medicines may help:  Reduce your fever.  Reduce your cough.  Relieve nasal congestion. HOME CARE INSTRUCTIONS   Take medicines only as directed by your health care provider.   Gargle warm saltwater or take cough drops to comfort your throat as directed by your health care provider.  Use a warm mist humidifier or inhale steam from a shower to increase air moisture. This may make it easier to breathe.  Drink enough fluid to keep your urine clear or pale yellow.   Eat soups and other clear broths and maintain good nutrition.   Rest as needed.   Return to work when your temperature has returned to normal or as your health care provider advises. You may need to  stay home longer to avoid infecting others. You can also use a face mask and careful hand washing to prevent spread of the virus.  Increase the usage of your inhaler if you have asthma.   Do not use any tobacco products, including cigarettes, chewing tobacco, or electronic cigarettes. If you need help quitting, ask your health care provider. PREVENTION  The best way to protect yourself from getting a cold is to practice good hygiene.   Avoid oral or hand contact with people with  cold symptoms.   Wash your hands often if contact occurs.  There is no clear evidence that vitamin C, vitamin E, echinacea, or exercise reduces the chance of developing a cold. However, it is always recommended to get plenty of rest, exercise, and practice good nutrition.  SEEK MEDICAL CARE IF:   You are getting worse rather than better.   Your symptoms are not controlled by medicine.   You have chills.  You have worsening shortness of breath.  You have brown or red mucus.  You have yellow or brown nasal discharge.  You have pain in your face, especially when you bend forward.  You have a fever.  You have swollen neck glands.  You have pain while swallowing.  You have white areas in the back of your throat. SEEK IMMEDIATE MEDICAL CARE IF:   You have severe or persistent:  Headache.  Ear pain.  Sinus pain.  Chest pain.  You have chronic lung disease and any of the following:  Wheezing.  Prolonged cough.  Coughing up blood.  A change in your usual mucus.  You have a stiff neck.  You have changes in your:  Vision.  Hearing.  Thinking.  Mood. MAKE SURE YOU:   Understand these instructions.  Will watch your condition.  Will get help right away if you are not doing well or get worse.   This information is not intended to replace advice given to you by your health care provider. Make sure you discuss any questions you have with your health care provider.   Document Released: 07/05/2000 Document Revised: 05/26/2014 Document Reviewed: 04/16/2013 Elsevier Interactive Patient Education 2016 [employer].   Emergency Department Resource Guide 1) Find a Doctor and Pay Out of Pocket Although you won't have to find out who is covered by your insurance plan, it is a good idea to ask around and get recommendations. You will then need to call the office and see if the doctor you have chosen will accept you as a new patient and what types of options they  offer for patients who are self-pay. Some doctors offer discounts or will set up payment plans for their patients who do not have insurance, but you will need to ask so you aren't surprised when you get to your appointment.  2) Contact Your Local Health Department Not all health departments have doctors that can see patients for sick visits, but many do, so it is worth a call to see if yours does. If you don't know where your local health department is, you can check in your phone book. The CDC also has a tool to help you locate your state's health department, and many state websites also have listings of all of their local health departments.  3) Find a Walk-in Clinic If your illness is not likely to be very severe or complicated, you may want to try a walk in clinic. These are popping up all over the country in pharmacies, drugstores, and  shopping centers. They're usually staffed by nurse practitioners or physician assistants that have been trained to treat common illnesses and complaints. They're usually fairly quick and inexpensive. However, if you have serious medical issues or chronic medical problems, these are probably not your best option.  No Primary Care Doctor: - Call Health Connect at  [phone] - they can help you locate a primary care doctor that  accepts your insurance, provides certain services, etc. - Physician Referral Service- [phone]  Chronic Pain Problems: Organization         Address  Phone   Notes  [facility]  [phone] Patients need to be referred by their primary care doctor.   Medication Assistance: Organization         Address  Phone   Notes  [facility] Medication [facility] [address] [place] [place], [place] [address] [phone] --Must be a resident of [facility] -- Must have NO insurance coverage whatsoever (no Medicaid/ Medicare, etc.) -- The pt. MUST have a primary care doctor that directs their care  regularly and follows them in the community   MedAssist  [phone]   [employer]  [phone]    Agencies that provide inexpensive medical care: Organization         Address  Phone   Notes  [facility]  [phone]   [facility]    [phone]   [facility] [address] [place], [place] [address] [phone]   [facility] [address] [place], [place] [phone]   Planned Parenthood    [phone]   [facility]    [phone]   [facility]  [address], [place] Phone:  [phone], Fax:  [phone] Hours of Operation:  9 am - 6 pm, M-F.  Also accepts Medicaid/Medicare and self-pay.  [facility]  [address], [place] Phone: [phone], Fax: [phone]. Hours of Operation:  8:30 am - 5:30 pm, M-F.  Also accepts Medicaid and self-pay.  [facility] [place] [address], [place] Phone: [phone]   [facility] [address] Natasha Bence [place], [place] [phone], Ext. [phone] Mondays & Thursdays: 7-9 AM.  First 15 patients are seen on a first come, first serve basis.    Medicaid-accepting [facility] Providers:  Organization         Address  Phone   Notes  [facility] [address], [place] [phone] Also accepts self-pay patients.  [facility] [place] [address] Laurell Josephs [place], [place]  [phone]   [facility] [address], [place] [phone]   [facility] [address] [place] [phone]   Renaye Rakers [address], [place]   [phone] Only accepts [place] Access [place] patients after they have their name applied to their card.   Self-Pay (no insurance) in [facility]:  Organization         Address  Phone    Notes  Sickle Cell Patients, [facility] [address] [place], [place] [phone]   [facility]gent Care [address] [place], [place] [phone]   [facility] [place]  [address], [place] [phone]  [facility]/Dr. Osei-Bonsu  [address] [place] or [address], [place] [phone] Phone number for both [place] and [place] locations is the same.  Urgent Medical and [facility] [address] [place] [phone]   [facility] [address] [place] or [address] [phone]   [facility] [address], [place] [phone], phone; [phone], fax Sees patients 1st and 3rd Saturday of every month.  Must not qualify for public or private insurance (i.e. Medicaid, Medicare, [place] Health Choice, Veterans' Benefits)  Household income should be no more than 200% of the poverty level The clinic cannot treat you if you are pregnant or think you are pregnant  Sexually transmitted diseases are not treated at the clinic.    Dental Care: Organization         Address  Phone  Notes  [facility] Department of [facility] [place] [phone] Accepts children up to age 37 who are enrolled in [place] or [place]; pregnant women with a Medicaid card; and children who have applied for Medicaid or [place] Health Choice, but were declined, whose parents can pay a reduced fee at time of service.  [facility] Department of [facility]  [address], [place] [phone] Accepts children up to age 42 who are enrolled in [place] or [place]; pregnant women with a Medicaid card; and children who have applied for Medicaid or [place] Health Choice, but were declined, whose parents can pay a reduced fee at time of service.  [place]  Adult Dental Access PROGRAM  [place] [phone] Patients are seen by appointment only. Walk-ins are not accepted. [place] will see patients 32 years of age and older. Monday - Tuesday (8am-5pm) Most Wednesdays (8:30-5pm) $30 per visit, cash only  [facility] Adult Dental Access PROGRAM  [address], [facility] [phone] Patients are seen by appointment only. Walk-ins are not accepted. [place] will see patients 57 years of age and older. One Wednesday Evening (Monthly: Volunteer Based).  $30 per visit, cash only  [place]  [phone] for adults; Children under age 23, call Graduate Pediatric Dentistry at [phone]. Children aged 28-14, please call [phone] to request a pediatric application.  Dental services are provided in all areas of dental care including fillings, crowns and bridges, complete and partial dentures, implants, gum treatment, root canals, and extractions. Preventive care is also provided. Treatment is provided to both adults and children. Patients are selected via a lottery and there is often a waiting list.   [facility] [address], [place]  [phone] [email or web address]   [facility] [address] [place], [place] [phone], Ext. [phone] Second and Fourth Thursday of each month, opens at 6:30 AM; Clinic ends at 9 AM.  Patients are seen on a first-come first-served basis, and a limited number are seen during each clinic.   [facility]  [address] Hillard Danker [place], [place] [phone]   Eligibility Requirements You must have lived in [place], [place], or [place] counties for at least the last three months.   You cannot be eligible for state or federal sponsored [employer], including [employer], [place], or [employer].   You generally cannot be eligible for healthcare insurance through  your employer.    How to  apply: Eligibility screenings are held every Tuesday and Wednesday afternoon from 1:00 pm until 4:00 pm. You do not need an appointment for the interview!  [facility] [address] [place], [place]   [place]  [place] Department  [place]  [phone]    Behavioral Health Resources in the Community: Intensive Outpatient Programs Organization         Address  Phone  Notes  [place]. [address] [place], [place] [phone]   [facility] Outpatient [address], [place], [place]   ADS: Alcohol & Drug Svcs [address], [place], [place]   [place] [address],  [place], [place] or [phone]   Substance Abuse Resources Organization         Address  Phone  Notes  Alcohol and Drug Services  [phone]   [place]  [phone]   [facility] [place]   Chinita Pester  [phone]   Residential & Outpatient Substance Abuse Program  [phone]   Psychological Services Organization         Address  Phone  Notes  [facility] [place]  [place]  [phone]   [place] [address] [place] or [phone]    Mobile Crisis Teams Organization         Address  Phone  Notes  [facility], Mobile Crisis Care Unit  [phone]   [facility]  [address] [place], [place]   [place] [address] [place] [phone]    Self-Help/Support Groups Organization         Address  Phone             Notes  [place]. of [place] - variety of support groups  [place] Call for more information  Narcotics Anonymous (NA), [facility] [address], [place]  2 meetings at this location   Special educational needs teacher         Address  Phone  Notes  [facility] Residential Treatment [place],    [place]  [phone]   [facility]  [address] [place] [phone], [place], [place]   [place], [place] [phone] Admissions: 8am-3pm M-F  Incentives Substance [place] [address],    [place], [place] [phone]   [facility] [address] [place], [place], [place]   [facility] [address],  [place], [place]   [facility]ight Programs - Intensive Outpatient [address] Kristeen [address], [place], [place]   [facility] ([place].,  [place], [place] [phone] or [phone]   [facility] Services (RTS) [address] [place], [place] Accepts Medicaid  [facility] [place] [address],  [place] [phone] Substance Abuse/Addiction Treatment   [facility] Organization         Address  Phone  Notes  [facility]  [phone]   Domenic Schwab, PhD [address] [place], [place]   [phone] or [phone]   Helper [place], [place] [phone]  [facility] [address] [place], [place] [phone] Insurance/Medicaid/sponsorship through [employer] and Families [address] [place], [place] [phone] [place], [place] [phone]    Dr. Adele Schilder  [phone]   [facility] Dept. 1) [address] [place] 2) [place] 3)  [place], [place] [phone]  [phone]   [place]  [phone] or [phone] (After Hours)

## 2015-01-09 NOTE — ED Provider Notes (Signed)
CSN: [id number]     Arrival date & time 01/09/15  1135 History   First MD Initiated Contact with Patient 01/09/15 1157     Chief Complaint  Patient presents with  . Sore Throat  . Nasal Congestion    HPI   Jakobee Brackins is a 49 y.o. male with a PMH of HTN, HLD, CHF, nonischemic cardiomyopathy who presents to the ED with nasal congestion, sore throat, and cough productive of yellow mucous x 1 week. He states his symptoms have been constant. He denies exacerbating factors. He states he has tried theraflu for symptom relief, which has been minimally effective. He reports subjective fever and chills. He denies chest pain. He reports shortness of breath only with coughing. He denies abdominal pain, nausea, vomiting. He reports coworkers have had similar symptoms.   Past Medical History  Diagnosis Date  . Hypertension   . Hyperlipidemia   . Shortness of breath   . Dyslipidemia   . Nonischemic dilated cardiomyopathy (HCC)     felt secondary to HTN with no ischemia on nuclear stress test 05/2012  . Diastolic dysfunction   . Chronic combined systolic and diastolic CHF (congestive heart failure) (HCC)     EF 25-30%   Past Surgical History  Procedure Laterality Date  . No past surgeries     Family History  Problem Relation Age of Onset  . Hypertension Mother     Passed away when she is 36 secondary to hip surgery complications  . Hypertension Father   . CAD Mother    Social History  Substance Use Topics  . Smoking status: Never Smoker   . Smokeless tobacco: Not on file  . Alcohol Use: 14.4 oz/week    24 Cans of beer per week     Comment: 2 beers per day     Review of Systems  Constitutional: Positive for fever and chills.  HENT: Positive for congestion and sore throat. Negative for trouble swallowing.   Respiratory: Positive for cough and shortness of breath.   Cardiovascular: Negative for chest pain.  Gastrointestinal: Negative for nausea, vomiting and abdominal pain.  All  other systems reviewed and are negative.     Allergies  Review of patient's allergies indicates no known allergies.  Home Medications   Prior to Admission medications   Medication Sig Start Date End Date Taking? Authorizing Provider  acetaminophen (TYLENOL) 500 MG tablet Take 1,000 mg by mouth every 6 (six) hours as needed for moderate pain or headache.   Yes Historical Provider, MD  atorvastatin (LIPITOR) 40 MG tablet Take 1 tablet (40 mg total) by mouth daily at 6 PM. 08/17/14  Yes Quintella Reichert, MD  Diphenhydramine-PE-APAP (THERAFLU EXPRESSMAX) 12.5-5-325 MG/15ML LIQD Take 1 Dose by mouth every 6 (six) hours as needed (flu symtpoms).   Yes Historical Provider, MD  hydrochlorothiazide (HYDRODIURIL) 25 MG tablet Take 1 tablet (25 mg total) by mouth daily. 08/17/14  Yes Quintella Reichert, MD  metoprolol (LOPRESSOR) 50 MG tablet Take 1.5 tablets (75 mg total) by mouth 2 (two) times daily. 09/24/14  Yes Quintella Reichert, MD  Multiple Vitamin (MULTIVITAMIN WITH MINERALS) TABS tablet Take 1 tablet by mouth daily.   Yes Historical Provider, MD  Phenylephrine-Pheniramine-DM [facility] COLD & COUGH) 11-12-18 MG PACK Take 1 packet by mouth 4 (four) times daily as needed (flu symtpoms).   Yes Historical Provider, MD    BP 148/94 mmHg  Pulse 86  Temp(Src) 98.5 F (36.9 C) (Oral)  Resp 18  Ht  (1.778 m)  Wt 105.688 kg  BMI 33.43 kg/m2  SpO2 94% Physical Exam  Constitutional: He is oriented to person, place, and time. He appears well-developed and well-nourished. No distress.  HENT:  Head: Normocephalic and atraumatic.  Right Ear: External ear normal.  Left Ear: External ear normal.  Nose: Nose normal. Right sinus exhibits no maxillary sinus tenderness and no frontal sinus tenderness. Left sinus exhibits no maxillary sinus tenderness and no frontal sinus tenderness.  Mouth/Throat: Uvula is midline, oropharynx is clear and moist and mucous membranes are normal. No oropharyngeal exudate,  posterior oropharyngeal edema, posterior oropharyngeal erythema or tonsillar abscesses.  Eyes: Conjunctivae, EOM and lids are normal. Pupils are equal, round, and reactive to light. Right eye exhibits no discharge. Left eye exhibits no discharge. No scleral icterus.  Neck: Normal range of motion. Neck supple.  Cardiovascular: Normal rate, regular rhythm, normal heart sounds, intact distal pulses and normal pulses.   Pulmonary/Chest: Effort normal and breath sounds normal. No respiratory distress. He has no wheezes. He has no rales.  Abdominal: Soft. Normal appearance and bowel sounds are normal. He exhibits no distension and no mass. There is no tenderness. There is no rigidity, no rebound and no guarding.  Musculoskeletal: Normal range of motion. He exhibits no edema or tenderness.  Neurological: He is alert and oriented to person, place, and time. He has normal strength. No sensory deficit.  Skin: Skin is warm, dry and intact. No rash noted. He is not diaphoretic. No erythema. No pallor.  Psychiatric: He has a normal mood and affect. His speech is normal and behavior is normal.  Nursing note and vitals reviewed.   ED Course  Procedures (including critical care time)  Labs Review Labs Reviewed  RAPID STREP SCREEN (NOT AT [facility])  CULTURE, GROUP A STREP    Imaging Review Dg Chest 2 View  01/09/2015  CLINICAL DATA:  Productive cough and fever for 1 week, hypertension, CHF, non ischemic dilated cardiomyopathy EXAM: CHEST  2 VIEW COMPARISON:  07/21/2013 FINDINGS: Enlargement of cardiac silhouette. Mediastinal contours and pulmonary vascularity normal. Lungs clear. No pleural effusion or pneumothorax. Endplate spur formation lower thoracic spine. IMPRESSION: Enlargement of cardiac silhouette. No acute abnormalities. Electronically Signed   By: Ulyses Southward M.D.   On: 01/09/2015 12:36   I have personally reviewed and evaluated these images and lab results as part of my medical decision-making.    EKG Interpretation None      MDM   Final diagnoses:  URI (upper respiratory infection)    49 year old male presents with nasal congestion, sore throat, and productive cough for the past week. Reports subjective fever and chills as well as shortness of breath with coughing. Denies chest pain, abdominal pain, nausea, vomiting. Patient is afebrile. Vital signs stable. No tachycardia, tachypnea, or hypoxia. Posterior oropharynx without erythema, edema, or exudate. Heart regular rate and rhythm. Lungs clear to auscultation bilaterally. Abdomen soft, nontender, nondistended. No lower extremity edema.  Will obtain rapid strep and chest x-ray.  Patient given cough medicine.  Strep negative. Chest x-ray negative for acute abnormalities. Patient is nontoxic and well-appearing, feel he is stable for discharge at this time. Symptoms likely viral. Advised to continue over-the-counter cough and cold medicines and to try warm honey, tea, and throat lozenges for additional symptom relief. Patient to follow-up with PCP in 2-3 days. Patient verbalizes his understanding and is in agreement with plan  BP 148/94 mmHg  Pulse 86  Temp(Src) 98.5 F (36.9 C) (Oral)  Resp 18  Ht 5\' 10"  (1.778 m)  Wt 105.688 kg  BMI 33.43 kg/m2  SpO2 94%    Mady Gemma, PA-C 01/09/15 1543  Bethann Berkshire, MD 01/10/15 [phone]

## 2015-01-09 NOTE — ED Notes (Signed)
Pt c/o sore throat, congestion, chills x 1 week.  States no OTC treatments have worked for him.

## 2015-01-11 LAB — CULTURE, GROUP A STREP: Strep A Culture: NEGATIVE

## 2015-10-18 ENCOUNTER — Other Ambulatory Visit: Payer: Self-pay | Admitting: Cardiology

## 2015-11-27 ENCOUNTER — Other Ambulatory Visit: Payer: Self-pay | Admitting: Cardiology

## 2016-01-15 ENCOUNTER — Other Ambulatory Visit: Payer: Self-pay | Admitting: Cardiology

## 2016-01-18 NOTE — Telephone Encounter (Signed)
metoprolol (LOPRESSOR) 50 MG tablet  Medication  Date: 11/29/2015 Department: [facility] [address] Office Ordering: Morley Kos, CMA Authorizing: Quintella Reichert, MD  Order Providers   Prescribing Provider Encounter Provider  Quintella Reichert, MD Quintella Reichert, MD  Medication Detail    Disp Refills Start End   metoprolol (LOPRESSOR) 50 MG tablet 90 tablet 0 11/29/2015    Sig - Route: Take 1.5 tablets (75 mg total) by mouth 2 (two) times daily. - Oral   Notes to Pharmacy: Pt needs to schedule an appointment for further refills   Cosign for Ordering: Accepted by Quintella Reichert, MD on 11/29/2015 9:38 AM   E-Prescribing Status: Receipt confirmed by pharmacy (11/29/2015 9:14 AM EST)   Pharmacy   [id number] - [place] ([place]), [place] - [address]   hydrochlorothiazide (HYDRODIURIL) 25 MG tablet  Medication  Date: 11/29/2015 Department: [facility] [address] Office Ordering: Morley Kos, CMA Authorizing: Quintella Reichert, MD  Order Providers   Prescribing Provider Encounter Provider  Quintella Reichert, MD Quintella Reichert, MD  Medication Detail    Disp Refills Start End   hydrochlorothiazide (HYDRODIURIL) 25 MG tablet 30 tablet 0 11/29/2015    Sig - Route: Take 1 tablet (25 mg total) by mouth daily. - Oral   Notes to Pharmacy: Pt needs to schedule an appointment for further refills   Cosign for Ordering: Accepted by Quintella Reichert, MD on 11/29/2015 9:38 AM   E-Prescribing Status: Receipt confirmed by pharmacy (11/29/2015 9:14 AM EST)

## 2016-02-05 ENCOUNTER — Other Ambulatory Visit: Payer: Self-pay | Admitting: Cardiology

## 2016-02-15 ENCOUNTER — Encounter (HOSPITAL_COMMUNITY): Payer: Self-pay | Admitting: Emergency Medicine

## 2016-02-15 ENCOUNTER — Emergency Department (HOSPITAL_COMMUNITY): Admission: EM | Admit: 2016-02-15 | Discharge: 2016-02-16 | Discharge status: Absent without leave | Payer: Self-pay

## 2016-02-15 LAB — COMPREHENSIVE METABOLIC PANEL
ALBUMIN: 4.2 g/dL (ref 3.5–5.0)
ALK PHOS: 68 U/L (ref 38–126)
ALT: 50 U/L (ref 17–63)
ANION GAP: 9 (ref 5–15)
AST: 32 U/L (ref 15–41)
BILIRUBIN TOTAL: 0.9 mg/dL (ref 0.3–1.2)
BUN: 15 mg/dL (ref 6–20)
CALCIUM: 9.9 mg/dL (ref 8.9–10.3)
CO2: 25 mmol/L (ref 22–32)
Chloride: 108 mmol/L (ref 101–111)
Creatinine, Ser: 1.15 mg/dL (ref 0.61–1.24)
GFR calc non Af Amer: >60 mL/min (ref >60)
Glucose, Bld: 107 mg/dL — ABNORMAL HIGH (ref 65–99)
POTASSIUM: 3.8 mmol/L (ref 3.5–5.1)
SODIUM: 142 mmol/L (ref 135–145)
TOTAL PROTEIN: 7.5 g/dL (ref 6.5–8.1)

## 2016-02-15 LAB — CBC
HEMATOCRIT: 45.9 % (ref 39.0–52.0)
HEMOGLOBIN: 15.3 g/dL (ref 13.0–17.0)
MCH: 27.9 pg (ref 26.0–34.0)
MCHC: 33.3 g/dL (ref 30.0–36.0)
MCV: 83.6 fL (ref 78.0–100.0)
Platelets: 344 10*3/uL (ref 150–400)
RBC: 5.49 MIL/uL (ref 4.22–5.81)
RDW: 14.6 % (ref 11.5–15.5)
WBC: 12.3 10*3/uL — ABNORMAL HIGH (ref 4.0–10.5)

## 2016-02-15 LAB — LIPASE, BLOOD: Lipase: 22 U/L (ref 11–51)

## 2016-02-15 MED ORDER — ONDANSETRON 4 MG PO TBDP: 4.0000 mg | ORAL_TABLET | Freq: Once | ORAL | Status: DC

## 2016-02-15 MED ORDER — ONDANSETRON 4 MG PO TBDP
ORAL_TABLET | ORAL | Status: AC
  Filled 2016-02-15: qty 1

## 2016-02-15 NOTE — ED Notes (Signed)
DISREGARD ALL PREVIOUS CHARTING AND LAB RESULTSFROM 02/15/2016. PREVIOUS RN HAD TRIAGED AND CHARTED ON WRONG PATIENT. REGISTRATION INFORMED.

## 2016-02-15 NOTE — ED Triage Notes (Signed)
Lower abd pain x 3 days n and diarrhea

## 2016-03-01 ENCOUNTER — Other Ambulatory Visit: Payer: Self-pay | Admitting: Cardiology

## 2016-03-04 ENCOUNTER — Other Ambulatory Visit: Payer: Self-pay | Admitting: Cardiology

## 2016-03-09 ENCOUNTER — Encounter (HOSPITAL_COMMUNITY): Payer: Self-pay | Admitting: Family Medicine

## 2016-03-09 ENCOUNTER — Ambulatory Visit (HOSPITAL_COMMUNITY)
Admission: EM | Admit: 2016-03-09 | Discharge: 2016-03-09 | Disposition: A | Discharge status: Home / Self Care | Payer: Self-pay | Attending: Internal Medicine | Admitting: Internal Medicine

## 2016-03-09 DIAGNOSIS — Z760 Encounter for issue of repeat prescription: Secondary | ICD-10-CM

## 2016-03-09 DIAGNOSIS — E785 Hyperlipidemia, unspecified: Secondary | ICD-10-CM

## 2016-03-09 DIAGNOSIS — R7989 Other specified abnormal findings of blood chemistry: Secondary | ICD-10-CM

## 2016-03-09 DIAGNOSIS — I10 Essential (primary) hypertension: Secondary | ICD-10-CM

## 2016-03-09 LAB — POCT I-STAT, CHEM 8
BUN: 30 mg/dL — ABNORMAL HIGH (ref 6–20)
CALCIUM ION: 1.24 mmol/L (ref 1.15–1.40)
CHLORIDE: 104 mmol/L (ref 101–111)
CREATININE: 2 mg/dL — AB (ref 0.61–1.24)
GLUCOSE: 98 mg/dL (ref 65–99)
HCT: 42 % (ref 39.0–52.0)
Hemoglobin: 14.3 g/dL (ref 13.0–17.0)
Potassium: 3.9 mmol/L (ref 3.5–5.1)
Sodium: 140 mmol/L (ref 135–145)
TCO2: 30 mmol/L (ref 0–100)

## 2016-03-09 MED ORDER — HYDROCHLOROTHIAZIDE 25 MG PO TABS: 25.0000 mg | ORAL_TABLET | Freq: Every day | ORAL | 0 refills | Status: DC

## 2016-03-09 MED ORDER — METOPROLOL TARTRATE 50 MG PO TABS: ORAL_TABLET | ORAL | 0 refills | Status: DC

## 2016-03-09 NOTE — ED Triage Notes (Signed)
Pt her for med refill.

## 2016-03-09 NOTE — ED Provider Notes (Signed)
CSN: [id number]     Arrival date & time 03/09/16  1634 History   None    Chief Complaint  Patient presents with  . Medication Refill   (Consider location/radiation/quality/duration/timing/severity/associated sxs/prior Treatment) 51 year old male presents to the urgent care for refill of chronic antihypertensive and cholesterol medication refills. He states he does not have insurance anymore and does not have a PCP. He was in the hospital just a few weeks ago for CHF in this started him on medication. He was told to come in to the office before he got another refill but since he did not have insurance to pay he was unable to make the appointment. He denies symptoms. No chest pain or shortness of breath he just needs to have his medications refilled. Medications include a Lopressor, HCTZ and Lipitor.      Past Medical History:  Diagnosis Date  . Chronic combined systolic and diastolic CHF (congestive heart failure) (HCC)    EF 25-30%  . Diastolic dysfunction   . Dyslipidemia   . Hyperlipidemia   . Hypertension   . Nonischemic dilated cardiomyopathy (HCC)    felt secondary to HTN with no ischemia on nuclear stress test 05/2012  . Shortness of breath    Past Surgical History:  Procedure Laterality Date  . NO PAST SURGERIES     Family History  Problem Relation Age of Onset  . Hypertension Mother     Passed away when she is 34 secondary to hip surgery complications  . CAD Mother   . Hypertension Father    Social History  Substance Use Topics  . Smoking status: Never Smoker  . Smokeless tobacco: Never Used  . Alcohol use 14.4 oz/week    24 Cans of beer per week     Comment: 2 beers per day    Review of Systems  Constitutional: Negative.   Respiratory: Negative.   Cardiovascular: Negative.   Gastrointestinal: Negative.   Endocrine: Negative for polydipsia, polyphagia and polyuria.  Neurological: Negative.   All other systems reviewed and are negative.   Allergies   Patient has no known allergies.  Home Medications   Prior to Admission medications   Medication Sig Start Date End Date Taking? Authorizing Provider  acetaminophen (TYLENOL) 500 MG tablet Take 1,000 mg by mouth every 6 (six) hours as needed for moderate pain or headache.    Historical Provider, MD  atorvastatin (LIPITOR) 40 MG tablet Take 1 tablet (40 mg total) by mouth daily at 6 PM. 08/17/14   Quintella Reichert, MD  Diphenhydramine-PE-APAP (THERAFLU EXPRESSMAX) 12.5-5-325 MG/15ML LIQD Take 1 Dose by mouth every 6 (six) hours as needed (flu symtpoms).    Historical Provider, MD  hydrochlorothiazide (HYDRODIURIL) 25 MG tablet Take 1 tablet (25 mg total) by mouth daily. 03/09/16   Hayden Rasmussen, NP  metoprolol (LOPRESSOR) 50 MG tablet Take 1.5 tabs bid for BP. 03/09/16   Hayden Rasmussen, NP  Multiple Vitamin (MULTIVITAMIN WITH MINERALS) TABS tablet Take 1 tablet by mouth daily.    Historical Provider, MD  Phenylephrine-Pheniramine-DM [facility] COLD & COUGH) 11-12-18 MG PACK Take 1 packet by mouth 4 (four) times daily as needed (flu symtpoms).    Historical Provider, MD   Meds Ordered and Administered this Visit  Medications - No data to display  BP 155/82   Pulse 91   Temp 98.5 F (36.9 C)   Resp 18   SpO2 100%  No data found.   Physical Exam  Constitutional: He is oriented to person,  place, and time. He appears well-developed and well-nourished. No distress.  Eyes: EOM are normal.  Neck: Normal range of motion. Neck supple.  Cardiovascular: Normal rate, regular rhythm, normal heart sounds and intact distal pulses.   Pulmonary/Chest: Effort normal and breath sounds normal. No respiratory distress. He has no wheezes. He has no rales.  Abdominal: Soft. There is no tenderness.  Musculoskeletal: He exhibits no edema.  Neurological: He is alert and oriented to person, place, and time. He exhibits normal muscle tone.  Skin: Skin is warm and dry.  Psychiatric: He has a normal mood and affect.   Nursing note and vitals reviewed.   Urgent Care Course    Results for orders placed or performed during the hospital encounter of 03/09/16  I-STAT, chem 8  Result Value Ref Range   Sodium 140 135 - 145 mmol/L   Potassium 3.9 3.5 - 5.1 mmol/L   Chloride 104 101 - 111 mmol/L   BUN 30 (H) 6 - 20 mg/dL   Creatinine, Ser 3.50 (H) 0.61 - 1.24 mg/dL   Glucose, Bld 98 65 - 99 mg/dL   Calcium, Ion 0.93 8.18 - 1.40 mmol/L   TCO2 30 0 - 100 mmol/L   Hemoglobin 14.3 13.0 - 17.0 g/dL   HCT 29.9 37.1 - 69.6 %  calc GFR 64.  Procedures (including critical care time)  Labs Review Labs Reviewed  POCT I-STAT, CHEM 8 - Abnormal; Notable for the following:       Result Value   BUN 30 (*)    Creatinine, Ser 2.00 (*)    All other components within normal limits    Imaging Review No results found.   Visual Acuity Review  Right Eye Distance:   Left Eye Distance:   Bilateral Distance:    Right Eye Near:   Left Eye Near:    Bilateral Near:         MDM   1. Encounter for medication refill   2. Essential hypertension   3. Dyslipidemia   4. Elevated serum creatinine    The refill medication is based on the directions that are listed on your medical record. They have not changed. Her sodium and potassium are normal. He should know that your kidney function has declined from the normal values. In other words, your creatinine level is elevated. You will need to have this checked out as soon as you can. Locate a primary care provider as soon as possible. There is a risk of taking medications prescribed by a health care provider without obtaining a complete history, labwork or proper physical exam, etc. This cannot be completed adequately at an urgent care. There can be multiple problems, some serious,  associated with medications and undetermined conditions of your health status. This action is performed as a last resort in order to supply you with medication. By receiving these  prescriptions you are ackowleging and accepting these risks and will not hold the prescriber or any agent of [facility] as responsible for any adverse outcomes.  Meds ordered this encounter  Medications  . hydrochlorothiazide (HYDRODIURIL) 25 MG tablet    Sig: Take 1 tablet (25 mg total) by mouth daily.    Dispense:  30 tablet    Refill:  0    Order Specific Question:   Supervising Provider    Answer:   Eustace Moore [[id number]]  . metoprolol (LOPRESSOR) 50 MG tablet    Sig: Take 1.5 tabs bid for  BP.    Dispense:  90 tablet    Refill:  0    Order Specific Question:   Supervising Provider    Answer:   Eustace Moore [[id number]]       Hayden Rasmussen, NP 03/09/16 [phone]

## 2016-03-09 NOTE — Discharge Instructions (Signed)
The refill medication is based on the directions that are listed on your medical record. They have not changed. Her sodium and potassium are normal. He should know that your kidney function has declined from the normal values. In other words, your creatinine level is elevated. You will need to have this checked out as soon as you can. Locate a primary care provider as soon as possible. There is a risk of taking medications prescribed by a health care provider without obtaining a complete history, labwork or proper physical exam, etc. This cannot be completed adequately at an urgent care. There can be multiple problems, some serious,  associated with medications and undetermined conditions of your health status. This action is performed as a last resort in order to supply you with medication. By receiving these prescriptions you are ackowleging and accepting these risks and will not hold the prescriber or any agent of [facility] as responsible for any adverse outcomes.

## 2016-03-22 ENCOUNTER — Ambulatory Visit (HOSPITAL_COMMUNITY)
Admission: EM | Admit: 2016-03-22 | Discharge: 2016-03-22 | Disposition: A | Discharge status: Home / Self Care | Payer: Self-pay | Attending: Family Medicine | Admitting: Family Medicine

## 2016-03-22 DIAGNOSIS — M1712 Unilateral primary osteoarthritis, left knee: Secondary | ICD-10-CM

## 2016-03-22 MED ORDER — ACETAMINOPHEN 325 MG PO TABS: 650.0000 mg | ORAL_TABLET | Freq: Four times a day (QID) | ORAL | 0 refills | Status: DC | PRN

## 2016-03-22 NOTE — Discharge Instructions (Signed)
There is a high likelihood that you have osteoarthritis (wear & tear arthritis) of the left knee aggravated by recently abruptly increasing your mileage. It's a great intention to lose weight and stay active, just dial it down a bit early on and work slowly up to more time on your feet. You can also try swimming and upper extremity weight work.   Take tylenol as needed for pain. With your history of heart failure, you should stay away from NSAIDs (ibuprofen, motrin, advil, aleve, naproxen, etc.). You can use topical OTC rubs. If your symptoms worsen, seek medical attention.   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

## 2016-03-22 NOTE — ED Provider Notes (Signed)
[facility]    CSN: [id number] Arrival date & time: 03/22/16  1346     History   Chief Complaint Chief Complaint  Patient presents with  . Knee Pain   HPI [name] is a 51 y.o. male with a history of combined CHF (though most recent echo 2016 showed EF 55-60%, normal diastolic function) presenting for left knee pain. He started back walking 2 weeks ago in an effort to lose weight, started at 3 miles from previously being sedentary. About 1 week ago he noted gradual onset of mild, worsening to moderate, intermittent, aching pain poorly localized to the left knee associated with stiffness worse with prolonged sitting. No morning stiffness. It has swollen compared to the left, but he can bear weight with minimal pain. He has continued to walk and not taken any medications for this. No other involved joints, specifically denies hip or ankle pain. No injuries or past surgeries on that knee.  Past Medical History:  Diagnosis Date  . Chronic combined systolic and diastolic CHF (congestive heart failure) (HCC)    EF 25-30%  . Diastolic dysfunction   . Dyslipidemia   . Hyperlipidemia   . Hypertension   . Nonischemic dilated cardiomyopathy (HCC)    felt secondary to HTN with no ischemia on nuclear stress test 05/2012  . Shortness of breath     Patient Active Problem List   Diagnosis Date Noted  . 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 HTN (hypertension) 05/25/2012    Past Surgical History:  Procedure Laterality Date  . NO PAST SURGERIES     Home Medications    Prior to Admission medications   Medication Sig Start Date End Date Taking? Authorizing Provider  atorvastatin (LIPITOR) 40 MG tablet Take 1 tablet (40 mg total) by mouth daily at 6 PM. 08/17/14  Yes Traci R Turner, MD  hydrochlorothiazide (HYDRODIURIL) 25 MG tablet Take 1 tablet (25 mg total) by mouth  daily. 03/09/16  Yes Hayden Rasmussen, NP  metoprolol (LOPRESSOR) 50 MG tablet Take 1.5 tabs bid for BP. 03/09/16  Yes Hayden Rasmussen, NP  Multiple Vitamin (MULTIVITAMIN WITH MINERALS) TABS tablet Take 1 tablet by mouth daily.   Yes Historical Provider, MD  acetaminophen (TYLENOL) 325 MG tablet Take 2 tablets (650 mg total) by mouth every 6 (six) hours as needed for moderate pain. 03/22/16   Tyrone Nine, MD  Diphenhydramine-PE-APAP [facility]) 12.5-5-325 MG/15ML LIQD Take 1 Dose by mouth every 6 (six) hours as needed (flu symtpoms).    Historical Provider, MD  Phenylephrine-Pheniramine-DM [facility] COLD & COUGH) 11-12-18 MG PACK Take 1 packet by mouth 4 (four) times daily as needed (flu symtpoms).    Historical Provider, MD    Family History Family History  Problem Relation Age of Onset  . Hypertension Mother     Passed away when she is 81 secondary to hip surgery complications  . CAD Mother   . Hypertension Father     Social History Social History  Substance Use Topics  . Smoking status: Never Smoker  . Smokeless tobacco: Never Used  . Alcohol use 14.4 oz/week    24 Cans of beer per week     Comment: 2 beers per day     Allergies   Patient has no known allergies.   Review of Systems Review of Systems Per HPI  Physical Exam  Physical Exam BP 155/72 (BP Location: Left Arm)  Pulse 81   Temp 98.6 F (37 C) (Oral)   Resp 16   SpO2 100%  Gen: Well-appearing overweight 50 y.o.male in NAD Left lower extremity: Left hip without pain on int/ext rotation. Ankle normal. Left knee without erythema or bony abnormalities. Slight effusion vs. medial synovitis. No warmth, joint line tenderness, patellar tenderness, or condyle tenderness. ROM full in flexion and extension. Ligaments with solid consistent endpoints including ACL, PCL, LCL, MCL. Negative Mcmurray's. Non painful patellar compression. Patellar glide with crepitus. Patellar and quadriceps tendons unremarkable. Hamstring and  quadriceps strength is normal.   UC Treatments / Results  Labs (all labs ordered are listed, but only abnormal results are displayed) Labs Reviewed - No data to display  EKG  EKG Interpretation None       Radiology No results found.  Procedures Procedures (including critical care time)  Medications Ordered in UC Medications - No data to display   Initial Impression / Assessment and Plan / UC Course  I have reviewed the triage vital signs and the nursing notes.  Pertinent labs & imaging results that were available during my care of the patient were reviewed by me and considered in my medical decision making (see chart for details).   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 Strongly suspect osteoarthritis. Discussed ongoing activity with reduction in mileage, weight loss. Discussed role of anti-inflammatories, but with his history of severe CHF, will avoid NSAIDs and recommend tylenol and topical OTC treatments.  Final Clinical Impressions(s) / UC Diagnoses   Final diagnoses:  Osteoarthritis of left knee, unspecified osteoarthritis type    New Prescriptions Current Discharge Medication List       Tyrone Nine, MD 03/22/16 1521

## 2016-03-22 NOTE — ED Triage Notes (Signed)
C/o left knee pain for three days States he has started walking lately States lateral movement hurts the worst

## 2016-03-27 ENCOUNTER — Emergency Department (HOSPITAL_COMMUNITY): Payer: Self-pay

## 2016-03-27 ENCOUNTER — Emergency Department (HOSPITAL_COMMUNITY)
Admission: EM | Admit: 2016-03-27 | Discharge: 2016-03-27 | Disposition: A | Discharge status: Home / Self Care | Payer: Self-pay | Attending: Emergency Medicine | Admitting: Emergency Medicine

## 2016-03-27 ENCOUNTER — Encounter (HOSPITAL_COMMUNITY): Payer: Self-pay | Admitting: Emergency Medicine

## 2016-03-27 DIAGNOSIS — M25562 Pain in left knee: Secondary | ICD-10-CM | POA: Insufficient documentation

## 2016-03-27 DIAGNOSIS — I110 Hypertensive heart disease with heart failure: Secondary | ICD-10-CM | POA: Insufficient documentation

## 2016-03-27 DIAGNOSIS — Z79899 Other long term (current) drug therapy: Secondary | ICD-10-CM | POA: Insufficient documentation

## 2016-03-27 DIAGNOSIS — I5042 Chronic combined systolic (congestive) and diastolic (congestive) heart failure: Secondary | ICD-10-CM | POA: Insufficient documentation

## 2016-03-27 MED ORDER — DICLOFENAC SODIUM 1 % TD GEL: 2.0000 g | Freq: Four times a day (QID) | TRANSDERMAL | 0 refills | Status: DC

## 2016-03-27 MED ORDER — PREDNISONE 20 MG PO TABS: ORAL_TABLET | ORAL | 0 refills | Status: DC

## 2016-03-27 NOTE — ED Notes (Signed)
Pt reports he takes his HTN med at night and has not taken it tonight.

## 2016-03-27 NOTE — ED Triage Notes (Signed)
Patient c/o left knee swelling x week. Patient reports started walking about 3 miles per day couple weeks ago. Denies any injury to knee

## 2016-03-27 NOTE — Discharge Instructions (Signed)
Take the prescribed medication as directed.  Slowly increase your walking back to your 3 mile mark. Follow-up with your primary care doctor. Return to the ED for new or worsening symptoms.

## 2016-03-27 NOTE — ED Notes (Signed)
Pt reports L knee pain and mild swelling.  States he started to walk 3 miles a day for a week now.  Started walking for weight loss.

## 2016-03-27 NOTE — ED Provider Notes (Signed)
[facility]-EMERGENCY DEPT Provider Note   CSN: [id number] Arrival date & time: 03/27/16  1636   By signing my name below, I, Freida Busman, attest that this documentation has been prepared under the direction and in the presence of Sharilyn Sites, PA-C. Electronically Signed: Freida Busman, Scribe. 03/27/2016. 6:33 PM.  History   Chief Complaint Chief Complaint  Patient presents with  . Joint Swelling    left knee    The history is provided by the patient. No language interpreter was used.     HPI Comments:  [name] is a 51 y.o. male who presents to the Emergency Department complaining of moderate left knee pain with associated swelling x ~ 1 week. Pt states he recently began walking 3 miles a day for exercise ~2-3 weeks ago. He was without pain for the first couple of days. Pt notes cracking noises in the knee. He also notes he is currently ambulating with a limp due to the pain.  He denies fall or acute injury.  He has been applying Biofreeze cream without relief. Denies right knee pain.   Past Medical History:  Diagnosis Date  . Chronic combined systolic and diastolic CHF (congestive heart failure) (HCC)    EF 25-30%  . Diastolic dysfunction   . Dyslipidemia   . Hyperlipidemia   . Hypertension   . Nonischemic dilated cardiomyopathy (HCC)    felt secondary to HTN with no ischemia on nuclear stress test 05/2012  . Shortness of breath     Patient Active Problem List   Diagnosis Date Noted  . 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 HTN (hypertension) 05/25/2012    Past Surgical History:  Procedure Laterality Date  . NO PAST SURGERIES         Home Medications    Prior to Admission medications   Medication Sig Start Date End Date Taking? Authorizing Provider  acetaminophen (TYLENOL) 325 MG tablet Take 2 tablets (650 mg total) by mouth every 6 (six) hours as needed for  moderate pain. 03/22/16   Tyrone Nine, MD  atorvastatin (LIPITOR) 40 MG tablet Take 1 tablet (40 mg total) by mouth daily at 6 PM. 08/17/14   Quintella Reichert, MD  Diphenhydramine-PE-APAP (THERAFLU EXPRESSMAX) 12.5-5-325 MG/15ML LIQD Take 1 Dose by mouth every 6 (six) hours as needed (flu symtpoms).    Historical Provider, MD  hydrochlorothiazide (HYDRODIURIL) 25 MG tablet Take 1 tablet (25 mg total) by mouth daily. 03/09/16   Hayden Rasmussen, NP  metoprolol (LOPRESSOR) 50 MG tablet Take 1.5 tabs bid for BP. 03/09/16   Hayden Rasmussen, NP  Multiple Vitamin (MULTIVITAMIN WITH MINERALS) TABS tablet Take 1 tablet by mouth daily.    Historical Provider, MD  Phenylephrine-Pheniramine-DM [facility] COLD & COUGH) 11-12-18 MG PACK Take 1 packet by mouth 4 (four) times daily as needed (flu symtpoms).    Historical Provider, MD    Family History Family History  Problem Relation Age of Onset  . Hypertension Mother     Passed away when she is 38 secondary to hip surgery complications  . CAD Mother   . Hypertension Father     Social History Social History  Substance Use Topics  . Smoking status: Never Smoker  . Smokeless tobacco: Never Used  . Alcohol use 14.4 oz/week    24 Cans of beer per week     Comment: 2 beers per day     Allergies  Patient has no known allergies.   Review of Systems Review of Systems  Musculoskeletal: Positive for arthralgias and joint swelling.  Neurological: Negative for weakness and numbness.   Physical Exam Updated Vital Signs BP 174/94 (BP Location: Left Arm)   Pulse 80   Temp 98.4 F (36.9 C) (Oral)   Resp 17   Ht 5\' 10"  (1.778 m)   Wt 239 lb (108.4 kg)   SpO2 99%   BMI 34.29 kg/m   Physical Exam  Constitutional: He is oriented to person, place, and time. He appears well-developed and well-nourished.  HENT:  Head: Normocephalic and atraumatic.  Mouth/Throat: Oropharynx is clear and moist.  Eyes: Conjunctivae and EOM are normal. Pupils are equal, round, and  reactive to light.  Neck: Normal range of motion.  Cardiovascular: Normal rate, regular rhythm and normal heart sounds.   Pulmonary/Chest: Effort normal and breath sounds normal.  Abdominal: Soft. Bowel sounds are normal.  Musculoskeletal: Normal range of motion.  Left knee with some swelling and tenderness noted along the medial joint line, limited flexion and extension secondary to pain, no apparent crepitus, no ligamentous laxity; ambulatory  Neurological: He is alert and oriented to person, place, and time.  Skin: Skin is warm and dry.  Psychiatric: He has a normal mood and affect.  Nursing note and vitals reviewed.    ED Treatments / Results  DIAGNOSTIC STUDIES:  Oxygen Saturation is 99% on RA, normal by my interpretation.    COORDINATION OF CARE:  6:27 PM Discussed treatment plan with pt at bedside and pt agreed to plan.  Labs (all labs ordered are listed, but only abnormal results are displayed) Labs Reviewed - No data to display  EKG  EKG Interpretation None       Radiology Dg Knee Complete 4 Views Left  Result Date: 03/27/2016 CLINICAL DATA:  51 year old male with increased left knee swelling for 1 week. Symptoms following increased exercise. EXAM: LEFT KNEE - COMPLETE 4+ VIEW COMPARISON:  None. FINDINGS: Moderate to severe suprapatellar joint effusion. Patella intact. Joint spaces and alignment are within normal limits. Bone mineralization is within normal limits. No acute osseous abnormality identified. IMPRESSION: Moderate to severe suspected joint effusion but no osseous abnormality identified in joint spaces appear preserved. Electronically Signed   By: Odessa Fleming M.D.   On: 03/27/2016 19:00    Procedures Procedures (including critical care time)  Medications Ordered in ED Medications - No data to display   Initial Impression / Assessment and Plan / ED Course  I have reviewed the triage vital signs and the nursing notes.  Pertinent labs & imaging results  that were available during my care of the patient were reviewed by me and considered in my medical decision making (see chart for details).  51 year old male here with left knee pain. Saw urgent care for this a few days ago, told likely osteoarthritis but continues having pain.  He does have some swelling and tenderness along the medial joint line of the left knee but there is no gross deformity. X-ray obtained, joint effusion noted without fracture. Suspect this is secondary to component of arthritis compounded with his newly adapted exercise regimen. Patient has baseline creatinine of 2.0, therefore will avoid oral anti-inflammatories. Will start on short course of prednisone as well as Voltaren gel, follow RICE routine. Will have him follow-up with his primary care doctor.  Discussed plan with patient, he acknowledged understanding and agreed with plan of care.  Return precautions given for new or worsening  symptoms.  Final Clinical Impressions(s) / ED Diagnoses   Final diagnoses:  Acute pain of left knee    New Prescriptions New Prescriptions   DICLOFENAC SODIUM (VOLTAREN) 1 % GEL    Apply 2 g topically 4 (four) times daily.   PREDNISONE (DELTASONE) 20 MG TABLET    Take 40 mg by mouth daily for 3 days, then 20mg  by mouth daily for 3 days, then 10mg  daily for 3 days   I personally performed the services described in this documentation, which was scribed in my presence. The recorded information has been reviewed and is accurate.   Garlon Hatchet, PA-C 03/27/16 1923    Garlon Hatchet, PA-C 03/27/16 1924    Rolan Bucco, MD 03/27/16 [phone]

## 2016-03-31 ENCOUNTER — Encounter (HOSPITAL_COMMUNITY): Payer: Self-pay | Admitting: *Deleted

## 2016-03-31 ENCOUNTER — Ambulatory Visit (HOSPITAL_COMMUNITY)
Admission: EM | Admit: 2016-03-31 | Discharge: 2016-03-31 | Disposition: A | Discharge status: Home / Self Care | Payer: Self-pay | Attending: Emergency Medicine | Admitting: Emergency Medicine

## 2016-03-31 DIAGNOSIS — M25462 Effusion, left knee: Secondary | ICD-10-CM

## 2016-03-31 DIAGNOSIS — M25562 Pain in left knee: Secondary | ICD-10-CM

## 2016-03-31 NOTE — Discharge Instructions (Signed)
Continue your knee sleeve, rest, ice, elevation. Follow-up with Dr. Charlann Boxer if you're not better in 2 weeks. You need to see a primary care physician for ongoing management of her congestive heart failure and blood pressure. your blood pressure is elevated today, as it was several days ago. Having it elevated for prolonged periods of time increases your risk for stroke, heart attack, damage to your eyes, kidneys.  Decrease your salt intake. diet and exercise will lower your blood pressure significantly. It is important to keep your blood pressure under good control, as having a elevated for prolonged periods of time significantly increases your risk of stroke, heart attacks, kidney damage, eye damage, and other problems. Measure your blood pressure once a day, preferably at the same time every day. Keep a log of this and bring it to your next doctor's appointment. Return immediately to the ER if you start having chest pain, headache, problems seeing, problems talking, problems walking, if you feel like you're about to pass out, if you do pass out, if you have a seizure, or for any other concerns.  Below is a list of primary care practices who are taking new patients for you to follow-up with. [facility] [address] [place], [place] [address] [phone]  [facility] [phone] [address] [place] [address]  [facility]: [address] [place] [address]  [phone]  [facility]: [address] [place] [address]   [phone]  [facility]: [address] [place] [address]  [phone]  [facility] : [address] [place] [address] [phone]  [facility]: [address] [place] [address] [phone]  [facility]: [address] [place] [address] [phone]  Dr. Oneal Grout [address] [facility] [address] [address]  [phone]  Dr. Jackie Plum, [facility]. [address] [place], [place] [address]  [phone]

## 2016-03-31 NOTE — ED Triage Notes (Signed)
Pt  Reports  Pain  And  Swelling  Of  l  Knee        X  10 days     denys   Any  specfic  Injury      Seen  Er  4  Days  Ago      Had  X  Ray      Still  Having  Pain

## 2016-03-31 NOTE — ED Provider Notes (Signed)
HPI  SUBJECTIVE:  [name] is a 51 y.o. male who presents with left knee pain for approximately 1-2 weeks. It started after he increased his exercise regimen, started walking 3 miles per day. he was seen in the ER  4 days ago for this. Had x-ray that showed moderate to severe suspected joint effusion but no osseous abnormalities in the joint spaces per radiology report. He was sent home on prednisone, Voltaren gel because his baseline creatinine is 2.0, rest, ice, compression, elevation. He has been doing all of these and states that the swelling and pain has significantly improved but states that his knee feels unstable laterally. It has not given way. Symptoms are worse with walking. He denies direct trauma to his knee, recent fall, clicking, fevers, erythema. He reports popping, but he has had this before. He has a past medical history of hypertension, renal insufficiency, CHF. No history of diabetes, gout. PMD: None.  Past Medical History:  Diagnosis Date  . Chronic combined systolic and diastolic CHF (congestive heart failure) (HCC)    EF 25-30%  . Diastolic dysfunction   . Dyslipidemia   . Hyperlipidemia   . Hypertension   . Nonischemic dilated cardiomyopathy (HCC)    felt secondary to HTN with no ischemia on nuclear stress test 05/2012  . Shortness of breath     Past Surgical History:  Procedure Laterality Date  . NO PAST SURGERIES      Family History  Problem Relation Age of Onset  . Hypertension Mother     Passed away when she is 58 secondary to hip surgery complications  . CAD Mother   . Hypertension Father     Social History  Substance Use Topics  . Smoking status: Never Smoker  . Smokeless tobacco: Never Used  . Alcohol use 14.4 oz/week    24 Cans of beer per week     Comment: 2 beers per day    No current facility-administered medications for this encounter.   Current Outpatient Prescriptions:  .  acetaminophen (TYLENOL) 325 MG tablet, Take 2 tablets (650 mg  total) by mouth every 6 (six) hours as needed for moderate pain., Disp: 30 tablet, Rfl: 0 .  atorvastatin (LIPITOR) 40 MG tablet, Take 1 tablet (40 mg total) by mouth daily at 6 PM., Disp: 30 tablet, Rfl: 1 .  diclofenac sodium (VOLTAREN) 1 % GEL, Apply 2 g topically 4 (four) times daily., Disp: 100 g, Rfl: 0 .  Diphenhydramine-PE-APAP (THERAFLU EXPRESSMAX) 12.5-5-325 MG/15ML LIQD, Take 1 Dose by mouth every 6 (six) hours as needed (flu symtpoms)., Disp: , Rfl:  .  hydrochlorothiazide (HYDRODIURIL) 25 MG tablet, Take 1 tablet (25 mg total) by mouth daily., Disp: 30 tablet, Rfl: 0 .  metoprolol (LOPRESSOR) 50 MG tablet, Take 1.5 tabs bid for BP., Disp: 90 tablet, Rfl: 0 .  Multiple Vitamin (MULTIVITAMIN WITH MINERALS) TABS tablet, Take 1 tablet by mouth daily., Disp: , Rfl:  .  Phenylephrine-Pheniramine-DM (THERAFLU COLD & COUGH) 11-12-18 MG PACK, Take 1 packet by mouth 4 (four) times daily as needed (flu symtpoms)., Disp: , Rfl:  .  predniSONE (DELTASONE) 20 MG tablet, Take 40 mg by mouth daily for 3 days, then 20mg  by mouth daily for 3 days, then 10mg  daily for 3 days, Disp: 12 tablet, Rfl: 0  No Known Allergies   ROS  As noted in HPI.   Physical Exam  BP 170/100 (BP Location: Left Arm)   Pulse 80   Temp 98.6 F (37 C) (  Oral)   Resp 18   SpO2 100%   Constitutional: Well developed, well nourished, no acute distress Eyes:  EOMI, conjunctiva normal bilaterally HENT: Normocephalic, atraumatic,mucus membranes moist Respiratory: Normal inspiratory effort Cardiovascular: Normal rate GI: nondistended skin: No rash, skin intact Musculoskeletal: L Knee ROM baseline for PT, Flexion/extension intact, Patella NT,  Patellar tendon NT, Medial joint  tender, and tenderness over the medial retinaculum Lateral joint mildly tender, Popliteal region NT, Lachman's stable, Varus LCL stress testing stable, Valgus MCL stress testing stable but painful McMurray's testing abnormal, distal NVI with intact  baseline sensation / motor / pulse distal to knee affected extremity. + effusion. No erythema. No increased temperature. No fibular tenderness Neurologic: Alert & oriented x 3, no focal neuro deficits Psychiatric: Speech and behavior appropriate   ED Course   Medications - No data to display  Orders Placed This Encounter  Procedures  . Ambulatory referral to Physical Therapy    Referral Priority:   Urgent    Referral Type:   Physical Medicine    Referral Reason:   Specialty Services Required    Requested Specialty:   Physical Therapy    Number of Visits Requested:   1    No results found for this or any previous visit (from the past 24 hour(s)). No results found.  ED Clinical Impression  Acute pain of left knee  Effusion of left knee  ED Assessment/Plan  Previous notes reviewed. As noted in history of present illness.  Presentation most consistent with a knee effusion from increased activity. No evidence of septic joint or gout. He states that he is overall getting better. Knee is stable on my exam. Feel that the sensation of instability is from pain. No knee immobilizer as this would impede healing. Will refer to orthopedics if not better in 10 days to 2 weeks, also providing primary care referral for ongoing management of his hypertension and congestive heart failure and also referral to physical therapy. We'll write light duty work note for one week. Discussed medical decision-making, plan for follow-up with patient. He agrees with plan  No orders of the defined types were placed in this encounter.   *This clinic note was created using Dragon dictation software. Therefore, there may be occasional mistakes despite careful proofreading.  ?   Domenick Gong, MD 03/31/16 1454

## 2016-04-06 ENCOUNTER — Ambulatory Visit (INDEPENDENT_AMBULATORY_CARE_PROVIDER_SITE_OTHER): Payer: Self-pay | Admitting: Orthopaedic Surgery

## 2016-04-06 ENCOUNTER — Encounter (INDEPENDENT_AMBULATORY_CARE_PROVIDER_SITE_OTHER): Payer: Self-pay | Admitting: Orthopaedic Surgery

## 2016-04-06 VITALS — BP 186/108 | HR 78 | Resp 14 | Ht 70.0 in | Wt 239.0 lb

## 2016-04-06 DIAGNOSIS — M25462 Effusion, left knee: Secondary | ICD-10-CM

## 2016-04-06 DIAGNOSIS — M25562 Pain in left knee: Secondary | ICD-10-CM

## 2016-04-06 MED ORDER — LIDOCAINE HCL 1 % IJ SOLN
3.0000 mL | INTRAMUSCULAR | Status: AC | PRN
  Administered 2016-04-06: 3 mL

## 2016-04-06 NOTE — Progress Notes (Signed)
Office Visit Note   Patient: [name]           Date of Birth: Jun 04, 1965           MRN: [id number] Visit Date: 04/06/2016              Requested by: No referring provider defined for this encounter. PCP: No PCP Per Patient   Assessment & Plan: Visit Diagnoses:  1. Left medial knee pain   2. Effusion, left knee     Plan:  #1: Aspiration of the left knee was performed and fluid since for laboratory analysis #2: MRI scan has been ordered to evaluate for meniscal tear or other internal derangement #3: Follow back up on Monday for review of his fluid analysis. Certainly this could be either gout or pseudogout.  Follow-Up Instructions: Return in about 4 days (around 04/10/2016).   Orders:  Orders Placed This Encounter  Procedures  . Body Fluid Culture  . MR Knee Left w/o contrast  . Synovial cell count + diff, w/ crystals   No orders of the defined types were placed in this encounter.     Procedures: Large Joint Inj Date/Time: 04/06/2016 11:25 AM Performed by: Jacqualine Code D Authorized by: Jacqualine Code D   Consent Given by:  Patient Timeout: prior to procedure the correct patient, procedure, and site was verified   Indications:  Pain and joint swelling Location:  Knee Site:  L knee Prep: patient was prepped and draped in usual sterile fashion   Needle Size:  18 G Needle Length:  1.5 inches Approach:  Anteromedial Ultrasound Guidance: No   Fluoroscopic Guidance: No   Arthrogram: No   Medications:  3 mL lidocaine 1 % Aspiration Attempted: Yes   Aspirate amount (mL):  10 Aspirate:  Yellow Lab: fluid sent for laboratory analysis   Patient tolerance:  Patient tolerated the procedure well with no immediate complications     Clinical Data: No additional findings.   Subjective: Chief Complaint  Patient presents with  . Left Knee - Pain    [name] is a 51 y.o. African-American male with a history of combined CHF (though most recent echo 2016  showed EF 55-60%, normal diastolic function) presented for left knee pain on 03/22/2016. He started back walking 2 weeks prior to this visit in the ER in an effort to lose weight. He started at 3 miles of walking from previously being sedentary. About 1 week prior to that visit in February he noted gradual onset of mild, worsening to moderate, intermittent, aching pain poorly localized to the left knee associated with stiffness worse with prolonged sitting. No morning stiffness. It has swollen compared to the left, but he can bear weight with minimal pain. He has continued to walk and not taken any medications for this. No other involved joints, specifically denies hip or ankle pain. No injuries or past surgeries on that knee. He was treated conservatively at that time.  He returned to the urgent care on 03/27/2016 at that time was placed on a prednisone dosepak. He did have some improvement but he still continues to have popping and some giving way symptoms. Return back on 03/31/2016 and no additional treatment was performed at that time. He was referred to [place] for evaluation.           Review of Systems  See past medical history   Objective: Vital Signs: BP (!) 186/108   Pulse 78   Resp 14   Ht 5'  10" (1.778 m)   Wt 239 lb (108.4 kg)   BMI 34.29 kg/m   Physical Exam  Constitutional: He is oriented to person, place, and time. He appears well-developed and well-nourished.  HENT:  Head: Normocephalic and atraumatic.  Eyes: EOM are normal. Pupils are equal, round, and reactive to light.  Pulmonary/Chest: Effort normal.  Musculoskeletal:       Left knee: He exhibits effusion.  Neurological: He is alert and oriented to person, place, and time.  Skin: Skin is warm and dry.  Psychiatric: He has a normal mood and affect. His behavior is normal. Judgment and thought content normal.    Left Knee Exam   Tenderness  The patient is experiencing tenderness in the medial joint  line.  Range of Motion  Extension: 0  Flexion: 110   Tests  McMurray:  Medial - positive  Drawer:       Anterior - negative     Posterior - negative Varus: negative Valgus: positive (ood endpoint with slight opening)  Other  Erythema: absent Sensation: normal Effusion: effusion present  Comments:  Mr. mild effusion.      Specialty Comments:  No specialty comments available.  Imaging: No results found.   PMFS History: Patient Active Problem List   Diagnosis Date Noted  . 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 HTN (hypertension) 05/25/2012   Past Medical History:  Diagnosis Date  . Chronic combined systolic and diastolic CHF (congestive heart failure) (HCC)    EF 25-30%  . Diastolic dysfunction   . Dyslipidemia   . Hyperlipidemia   . Hypertension   . Nonischemic dilated cardiomyopathy (HCC)    felt secondary to HTN with no ischemia on nuclear stress test 05/2012  . Shortness of breath     Family History  Problem Relation Age of Onset  . Hypertension Mother     Passed away when she is 69 secondary to hip surgery complications  . CAD Mother   . Hypertension Father     Past Surgical History:  Procedure Laterality Date  . NO PAST SURGERIES     Social History   Occupational History  . Not on file.   Social History Main Topics  . Smoking status: Never Smoker  . Smokeless tobacco: Never Used  . Alcohol use 14.4 oz/week    24 Cans of beer per week     Comment: 2 beers per day  . Drug use: Yes    Types: Marijuana     Comment: 2x/week  . Sexual activity: Yes

## 2016-04-07 LAB — SYNOVIAL CELL COUNT + DIFF, W/ CRYSTALS
BASOPHILS, %: 0 %
EOSINOPHILS-SYNOVIAL: 0 % (ref 0–2)
Lymphocytes-Synovial Fld: 35 % (ref 0–74)
Monocyte/Macrophage: 53 % (ref 0–69)
Neutrophil, Synovial: 12 % (ref 0–24)
Synoviocytes, %: 0 % (ref 0–15)
WBC, SYNOVIAL: 195 {cells}/uL — AB (ref <150)

## 2016-04-09 ENCOUNTER — Ambulatory Visit
Admission: RE | Admit: 2016-04-09 | Discharge: 2016-04-09 | Disposition: A | Discharge status: Home / Self Care | Payer: No Typology Code available for payment source | Source: Ambulatory Visit | Attending: Orthopedic Surgery | Admitting: Orthopedic Surgery

## 2016-04-10 ENCOUNTER — Encounter (INDEPENDENT_AMBULATORY_CARE_PROVIDER_SITE_OTHER): Payer: Self-pay | Admitting: Orthopaedic Surgery

## 2016-04-10 ENCOUNTER — Ambulatory Visit (INDEPENDENT_AMBULATORY_CARE_PROVIDER_SITE_OTHER): Payer: Self-pay | Admitting: Orthopaedic Surgery

## 2016-04-10 VITALS — BP 169/96 | HR 72 | Resp 16 | Ht 70.0 in | Wt 220.0 lb

## 2016-04-10 DIAGNOSIS — G8929 Other chronic pain: Secondary | ICD-10-CM

## 2016-04-10 DIAGNOSIS — M25562 Pain in left knee: Secondary | ICD-10-CM

## 2016-04-10 NOTE — Progress Notes (Signed)
Office Visit Note   Patient: [name]           Date of Birth: 07-Jan-1966           MRN: [id number] Visit Date: 04/10/2016              Requested by: No referring provider defined for this encounter. PCP: No PCP Per Patient   Assessment & Plan: Visit Diagnoses: tear medial meniscus left knee  Plan: arthroscopic debridement. No work until after surgery  Follow-Up Instructions: No Follow-up on file.   Orders:  No orders of the defined types were placed in this encounter.  No orders of the defined types were placed in this encounter.     Procedures: No procedures performed   Clinical Data: No additional findings. MRI scan of left knee demonstrates a grade 3 oblique tear of the posterior horn of the medial meniscus. Grade 2 sprain of proximal MCL. Mild degenerative chondral thinning in the medial compartment with a very small Baker's cyst.  Lab analysis of knee joint effusion demonstrated no crystals noted 195 WBCs with 12 neutrophils 53 monocytes and 35 lymphocytes  Subjective: No chief complaint on file.   [name] is here today for MRI results of Left knee. He relates any lateral movement causes him pain.  [name] continues to complain of left knee pain with a small effusion. His pain is predominantly along the medial compartment and limits his ability to work  Review of Systems   Objective: Vital Signs: There were no vitals taken for this visit.  Physical Exam  Ortho Exam left knee exam with very small effusion. Full extension and flexion over 105. No instability. No opening with a varus or valgus stress. No swelling distally. Neurovascular exam intact. Predominantly medial joint pain posteriorly. No patella crepitation. No lateral joint pain. No popliteal mass  Specialty Comments:  No specialty comments available.  Imaging: Mr Knee Left W/o Contrast  Result Date: 04/09/2016 CLINICAL DATA:  Medial left knee pain and effusion. Reduced range of motion.  Duration: 2 weeks. EXAM: MRI OF THE LEFT KNEE WITHOUT CONTRAST TECHNIQUE: Multiplanar, multisequence MR imaging of the knee was performed. No intravenous contrast was administered. COMPARISON:  03/27/2016 radiograph FINDINGS: Despite efforts by the technologist and patient, motion artifact is present on today's exam and could not be eliminated. This reduces exam sensitivity and specificity. MENISCI Medial meniscus: Grade 3 oblique tear of the posterior horn involving the inferior meniscal surface. Small free edge tear of the posterior horn. Accentuated signal along the meniscal periphery without definite meniscocapsular separation or parameniscal cyst. Lateral meniscus:  Incomplete discoid variant.  No tear. LIGAMENTS Cruciates:  Unremarkable Collaterals: Mild thickening of the MCL compatible with grade 2 sprain or partial tear. CARTILAGE Patellofemoral:  Unremarkable Medial:  Mild degenerative chondral thinning. Lateral:  Unremarkable Joint: Moderate knee joint effusion. Mild generalized synovitis with more focal synovitis just above the medial patellar facet. Popliteal Fossa:  Small Baker's cyst. Extensor Mechanism: Subtle linear increased signal in the distal quadriceps tendon is likely incidental but in the appropriate clinical setting could represent a quadriceps sprain. Bones: No significant extra-articular osseous abnormalities identified. Other: No supplemental non-categorized findings. IMPRESSION: 1. Grade 3 oblique tear of the posterior horn medial meniscus involving the inferior meniscal surface and free edge. 2. Grade 2 sprain or partial tear of the proximal MCL. 3. Mild degenerative chondral thinning in the medial compartment. 4. Moderate knee joint effusion with mild synovitis. Focal synovitis just above the medial patellar  facet. 5. Small Baker's cyst. 6. Subtle linear increased signal in the distal quadriceps tendon is likely incidental but in the appropriate clinical setting could represent a  quadriceps sprain. Electronically Signed   By: Gaylyn Rong M.D.   On: 04/09/2016 15:22     PMFS History: Patient Active Problem List   Diagnosis Date Noted  . 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 HTN (hypertension) 05/25/2012   Past Medical History:  Diagnosis Date  . Chronic combined systolic and diastolic CHF (congestive heart failure) (HCC)    EF 25-30%  . Diastolic dysfunction   . Dyslipidemia   . Hyperlipidemia   . Hypertension   . Nonischemic dilated cardiomyopathy (HCC)    felt secondary to HTN with no ischemia on nuclear stress test 05/2012  . Shortness of breath     Family History  Problem Relation Age of Onset  . Hypertension Mother     Passed away when she is 61 secondary to hip surgery complications  . CAD Mother   . Hypertension Father     Past Surgical History:  Procedure Laterality Date  . NO PAST SURGERIES     Social History   Occupational History  . Not on file.   Social History Main Topics  . Smoking status: Never Smoker  . Smokeless tobacco: Never Used  . Alcohol use 14.4 oz/week    24 Cans of beer per week     Comment: 2 beers per day  . Drug use: Yes    Types: Marijuana     Comment: 2x/week  . Sexual activity: Yes

## 2016-04-11 ENCOUNTER — Telehealth (INDEPENDENT_AMBULATORY_CARE_PROVIDER_SITE_OTHER): Payer: Self-pay | Admitting: Orthopaedic Surgery

## 2016-04-11 ENCOUNTER — Encounter (HOSPITAL_BASED_OUTPATIENT_CLINIC_OR_DEPARTMENT_OTHER): Payer: Self-pay | Admitting: *Deleted

## 2016-04-11 LAB — BODY FLUID CULTURE
Gram Stain: NONE SEEN
ORGANISM ID, BACTERIA: NO GROWTH

## 2016-04-11 NOTE — H&P (Signed)
Dalton Campbell, MD   Dalton Code, PA-C [address], [place], [place]  [address]                             [phone]   ORTHOPAEDIC HISTORY & PHYSICAL  [name] MRN:  [id number] DOB/SEX:  [birth year]/male  CHIEF COMPLAINT:  Painful left knee  HISTORY:[name] is a 51 y.o. African-American male with a history of combined CHF (though most recent echo 2016 showed EF 55-60%, normal diastolic function) presented for left knee pain on 03/22/2016. He started back walking 2 weeks prior to this visit in the ER in an effort to lose weight. He started at 3 miles of walking from previously being sedentary. About 1 week prior to that visit in February he noted gradual onset of mild, worsening to moderate, intermittent, aching pain poorly localized to the left knee associated with stiffness worse with prolonged sitting. No morning stiffness. It has swollen compared to the left, but he can bear weight with minimal pain. He has continued to walk and not taken any medications for this. No other involved joints, specifically denies hip or ankle pain. No injuries or past surgeries on that knee. He was treated conservatively at that time.  He returned to the urgent care on 03/27/2016 at that time was placed on a prednisone dosepak. He did have some improvement but he still continues to have popping and some giving way symptoms. Return back on 03/31/2016 and no additional treatment was performed at that time.   He was seen on 04/06/2016 and at that time aspiration was performed revealing as 195 white cells with only 12 neutrophils and 35 lymphocytes. An MRI scan was ordered.  FINDINGS: Despite efforts by the technologist and patient, motion artifact is present on today's exam and could not be eliminated. This reduces exam sensitivity and specificity.  MENISCI  Medial meniscus: Grade 3 oblique tear of the posterior horn involving the inferior meniscal surface. Small free edge tear of  the posterior horn. Accentuated signal along the meniscal periphery without definite meniscocapsular separation or parameniscal cyst.  Lateral meniscus:  Incomplete discoid variant.  No tear.  LIGAMENTS  Cruciates:  Unremarkable  Collaterals: Mild thickening of the MCL compatible with grade 2 sprain or partial tear.  CARTILAGE  Patellofemoral:  Unremarkable  Medial:  Mild degenerative chondral thinning.  Lateral:  Unremarkable  Joint: Moderate knee joint effusion. Mild generalized synovitis with more focal synovitis just above the medial patellar facet.  Popliteal Fossa:  Small Baker's cyst.  Extensor Mechanism: Subtle linear increased signal in the distal quadriceps tendon is likely incidental but in the appropriate clinical setting could represent a quadriceps sprain.  Bones: No significant extra-articular osseous abnormalities identified.  Other: No supplemental non-categorized findings.  IMPRESSION: 1. Grade 3 oblique tear of the posterior horn medial meniscus involving the inferior meniscal surface and free edge. 2. Grade 2 sprain or partial tear of the proximal MCL. 3. Mild degenerative chondral thinning in the medial compartment. 4. Moderate knee joint effusion with mild synovitis. Focal synovitis just above the medial patellar facet. 5. Small Baker's cyst. 6. Subtle linear increased signal in the distal quadriceps tendon is likely incidental but in the appropriate clinical setting could represent a quadriceps sprain.   [name] continues to complain of left knee pain with a small effusion. His pain is predominantly along the medial compartment and limits his ability to work   PAST MEDICAL  HISTORY: Patient Active Problem List   Diagnosis Date Noted  . 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 HTN (hypertension) 05/25/2012   Past  Medical History:  Diagnosis Date  . Chronic combined systolic and diastolic CHF (congestive heart failure) (HCC)    EF 25-30%  . Diastolic dysfunction   . Dyslipidemia   . Hyperlipidemia   . Hypertension   . Nonischemic dilated cardiomyopathy (HCC)    felt secondary to HTN with no ischemia on nuclear stress test 05/2012, EF 25-30%  . Shortness of breath    Past Surgical History:  Procedure Laterality Date  . NASAL FRACTURE SURGERY       MEDICATIONS:   No prescriptions prior to admission.   Current Facility-Administered Medications  Medication Dose Route Frequency Provider Last Rate Last Dose  . 0.9 %  sodium chloride infusion   Intravenous Continuous Oris Drone Caiden Monsivais, PA-C      . chlorhexidine (HIBICLENS) 4 % liquid 4 application  60 mL Topical Once Jetty Peeks, PA-C      . fentaNYL (SUBLIMAZE) injection 50-100 mcg  50-100 mcg Intravenous PRN Lewie Loron, MD   50 mcg at 04/13/16 1311  . lactated ringers infusion   Intravenous Continuous Lewie Loron, MD   Stopped at 04/13/16 1425  . midazolam (VERSED) injection 1-2 mg  1-2 mg Intravenous PRN Lewie Loron, MD   2 mg at 04/13/16 1237  . scopolamine (TRANSDERM-SCOP) 1 MG/3DAYS 1.5 mg  1 patch Transdermal Once PRN Lewie Loron, MD       Current Outpatient Prescriptions  Medication Sig Dispense Refill  . atorvastatin (LIPITOR) 40 MG tablet Take 1 tablet (40 mg total) by mouth daily at 6 PM. 30 tablet 1  . hydrochlorothiazide (HYDRODIURIL) 25 MG tablet Take 1 tablet (25 mg total) by mouth daily. 30 tablet 0  . metoprolol (LOPRESSOR) 50 MG tablet Take 1.5 tabs bid for BP. 90 tablet 0  . Multiple Vitamin (MULTIVITAMIN WITH MINERALS) TABS tablet Take 1 tablet by mouth daily.    . predniSONE (DELTASONE) 20 MG tablet Take 40 mg by mouth daily for 3 days, then 20mg  by mouth daily for 3 days, then 10mg  daily for 3 days 12 tablet 0  . oxyCODONE-acetaminophen (ROXICET) 5-325 MG tablet Take 1-2 tablets by mouth every 4 (four) hours  as needed for moderate pain or severe pain. 60 tablet 0     ALLERGIES:  No Known Allergies  REVIEW OF SYSTEMS:  A comprehensive review of systems was negative.   FAMILY HISTORY:   Family History  Problem Relation Age of Onset  . Hypertension Mother     Passed away when she is 63 secondary to hip surgery complications  . CAD Mother   . Hypertension Father     SOCIAL HISTORY:   Social History  Substance Use Topics  . Smoking status: Never Smoker  . Smokeless tobacco: Never Used  . Alcohol use 14.4 oz/week    24 Cans of beer per week     Comment: 2 beers per day      EXAMINATION: Vital signs in last 24 hours: Temp:  [97.6 F (36.4 C)-97.7 F (36.5 C)] 97.7 F (36.5 C) (03/22 1427) Pulse Rate:  [73-84] 73 (03/22 1427) Resp:  [15-18] 18 (03/22 1427) BP: (120-171)/(77-101) 171/90 (03/22 1427) SpO2:  [97 %-100 %] 100 % (03/22 1427)  Head is normocephalic.   Eyes:  Pupils equal, round and reactive  to light and accommodation.  Extraocular intact. ENT: Ears, nose, and throat were benign.   Neck: supple, no bruits were noted.   Chest: good expansion.   Lungs: essentially clear.   Cardiac: regular rhythm and rate, normal S1, S2.  No murmurs appreciated. Pulses :  1+ bilateral and symmetric in LOWER extremities. Abdomen is scaphoid, soft, nontender, no masses palpable, normal bowel sounds                  present. CNS:  He is oriented x3 and cranial nerves II-XII grossly intact. Breast, rectal, and genital exams: not performed and not indicated for an orthopedic evaluation. Musculoskeletal: Tenderness  The patient is experiencing tenderness in the medial joint line.  Range of Motion  Extension: 0  Flexion: 110   Tests  McMurray:  Medial - positive  Drawer:       Anterior - negative     Posterior - negative Varus: negative Valgus: positive (Good endpoint with slight opening)    Imaging Review FINDINGS: Despite efforts by the technologist and patient, motion  artifact is present on today's exam and could not be eliminated. This reduces exam sensitivity and specificity.  MENISCI  Medial meniscus: Grade 3 oblique tear of the posterior horn involving the inferior meniscal surface. Small free edge tear of the posterior horn. Accentuated signal along the meniscal periphery without definite meniscocapsular separation or parameniscal cyst.  Lateral meniscus:  Incomplete discoid variant.  No tear.  LIGAMENTS  Cruciates:  Unremarkable  Collaterals: Mild thickening of the MCL compatible with grade 2 sprain or partial tear.  CARTILAGE  Patellofemoral:  Unremarkable  Medial:  Mild degenerative chondral thinning.  Lateral:  Unremarkable  Joint: Moderate knee joint effusion. Mild generalized synovitis with more focal synovitis just above the medial patellar facet.  Popliteal Fossa:  Small Baker's cyst.  Extensor Mechanism: Subtle linear increased signal in the distal quadriceps tendon is likely incidental but in the appropriate clinical setting could represent a quadriceps sprain.  Bones: No significant extra-articular osseous abnormalities identified.  Other: No supplemental non-categorized findings.  IMPRESSION: 1. Grade 3 oblique tear of the posterior horn medial meniscus involving the inferior meniscal surface and free edge. 2. Grade 2 sprain or partial tear of the proximal MCL. 3. Mild degenerative chondral thinning in the medial compartment. 4. Moderate knee joint effusion with mild synovitis. Focal synovitis just above the medial patellar facet. 5. Small Baker's cyst. 6. Subtle linear increased signal in the distal quadriceps tendon is likely incidental but in the appropriate clinical setting could represent a quadriceps sprain.   ASSESSMENT: left medial meniscal tear with grade 2 sprain or partial tearing of the proximal MCL.  Past Medical History:  Diagnosis Date  . Chronic combined systolic and  diastolic CHF (congestive heart failure) (HCC)    EF 25-30%  . Diastolic dysfunction   . Dyslipidemia   . Hyperlipidemia   . Hypertension   . Nonischemic dilated cardiomyopathy (HCC)    felt secondary to HTN with no ischemia on nuclear stress test 05/2012, EF 25-30%  . Shortness of breath     PLAN: Plan for left arthroscopic partial left medial meniscectomy with debridement of the joint  The procedure,  risks, and benefits of surgery were presented and reviewed. The risks including but not limited to infection, blood clots, vascular and nerve injury, stiffness,  among others were discussed. The patient acknowledged the explanation, agreed to proceed.   Oris Drone Aleda [facility] [phone]  04/13/2016 3:58 PM

## 2016-04-11 NOTE — Telephone Encounter (Signed)
LVM on both pts phones for pt to call and schedule surgery. Will await pts phone call.

## 2016-04-11 NOTE — Progress Notes (Signed)
   04/11/16 1437  OBSTRUCTIVE SLEEP APNEA  Have you ever been diagnosed with sleep apnea through a sleep study? No  Do you snore loudly (loud enough to be heard through closed doors)?  1  Do you often feel tired, fatigued, or sleepy during the daytime (such as falling asleep during driving or talking to someone)? 0  Has anyone observed you stop breathing during your sleep? 0  Do you have, or are you being treated for high blood pressure? 1  BMI more than 35 kg/m2? 0  Age > 50 (1-yes) 1  Neck circumference greater than:Male 16 inches or larger, Male 17inches or larger? 0  Male Gender (Yes=1) 1  Obstructive Sleep Apnea Score 4

## 2016-04-12 ENCOUNTER — Other Ambulatory Visit: Payer: Self-pay

## 2016-04-12 ENCOUNTER — Encounter (HOSPITAL_BASED_OUTPATIENT_CLINIC_OR_DEPARTMENT_OTHER)
Admission: RE | Admit: 2016-04-12 | Discharge: 2016-04-12 | Disposition: A | Discharge status: Home / Self Care | Payer: No Typology Code available for payment source | Source: Ambulatory Visit | Attending: Orthopaedic Surgery | Admitting: Orthopaedic Surgery

## 2016-04-12 DIAGNOSIS — Z01818 Encounter for other preprocedural examination: Secondary | ICD-10-CM | POA: Insufficient documentation

## 2016-04-12 LAB — BASIC METABOLIC PANEL
ANION GAP: 9 (ref 5–15)
BUN: 22 mg/dL — ABNORMAL HIGH (ref 6–20)
CHLORIDE: 104 mmol/L (ref 101–111)
CO2: 27 mmol/L (ref 22–32)
Calcium: 9.8 mg/dL (ref 8.9–10.3)
Creatinine, Ser: 1.21 mg/dL (ref 0.61–1.24)
GFR calc non Af Amer: >60 mL/min (ref >60)
Glucose, Bld: 102 mg/dL — ABNORMAL HIGH (ref 65–99)
POTASSIUM: 4.4 mmol/L (ref 3.5–5.1)
Sodium: 140 mmol/L (ref 135–145)

## 2016-04-12 NOTE — Progress Notes (Signed)
EKG viewed by Dr Krista Blue along side previous EKG.

## 2016-04-13 ENCOUNTER — Ambulatory Visit (HOSPITAL_BASED_OUTPATIENT_CLINIC_OR_DEPARTMENT_OTHER): Payer: Self-pay | Admitting: Anesthesiology

## 2016-04-13 ENCOUNTER — Encounter (HOSPITAL_BASED_OUTPATIENT_CLINIC_OR_DEPARTMENT_OTHER)
Admission: RE | Disposition: A | Discharge status: Home / Self Care | Payer: Self-pay | Source: Ambulatory Visit | Attending: Orthopaedic Surgery

## 2016-04-13 ENCOUNTER — Encounter (HOSPITAL_BASED_OUTPATIENT_CLINIC_OR_DEPARTMENT_OTHER): Payer: Self-pay | Admitting: Anesthesiology

## 2016-04-13 ENCOUNTER — Ambulatory Visit (HOSPITAL_BASED_OUTPATIENT_CLINIC_OR_DEPARTMENT_OTHER)
Admission: RE | Admit: 2016-04-13 | Discharge: 2016-04-13 | Disposition: A | Discharge status: Home / Self Care | Payer: Self-pay | Source: Ambulatory Visit | Attending: Orthopaedic Surgery | Admitting: Orthopaedic Surgery

## 2016-04-13 DIAGNOSIS — S83242A Other tear of medial meniscus, current injury, left knee, initial encounter: Secondary | ICD-10-CM | POA: Insufficient documentation

## 2016-04-13 DIAGNOSIS — S83222D Peripheral tear of medial meniscus, current injury, left knee, subsequent encounter: Secondary | ICD-10-CM

## 2016-04-13 DIAGNOSIS — M23332 Other meniscus derangements, other medial meniscus, left knee: Secondary | ICD-10-CM

## 2016-04-13 DIAGNOSIS — I420 Dilated cardiomyopathy: Secondary | ICD-10-CM | POA: Insufficient documentation

## 2016-04-13 DIAGNOSIS — I110 Hypertensive heart disease with heart failure: Secondary | ICD-10-CM | POA: Insufficient documentation

## 2016-04-13 DIAGNOSIS — Z79899 Other long term (current) drug therapy: Secondary | ICD-10-CM | POA: Insufficient documentation

## 2016-04-13 DIAGNOSIS — I5042 Chronic combined systolic (congestive) and diastolic (congestive) heart failure: Secondary | ICD-10-CM | POA: Insufficient documentation

## 2016-04-13 DIAGNOSIS — M94262 Chondromalacia, left knee: Secondary | ICD-10-CM | POA: Insufficient documentation

## 2016-04-13 DIAGNOSIS — E785 Hyperlipidemia, unspecified: Secondary | ICD-10-CM | POA: Insufficient documentation

## 2016-04-13 DIAGNOSIS — X58XXXA Exposure to other specified factors, initial encounter: Secondary | ICD-10-CM | POA: Insufficient documentation

## 2016-04-13 HISTORY — PX: KNEE ARTHROSCOPY: SHX127

## 2016-04-13 HISTORY — PX: KNEE ARTHROSCOPY WITH MEDIAL MENISECTOMY: SHX5651

## 2016-04-13 SURGERY — ARTHROSCOPY, KNEE
Anesthesia: General | Site: Knee | Laterality: Right

## 2016-04-13 MED ORDER — LIDOCAINE-EPINEPHRINE 0.5 %-1:200000 IJ SOLN
INTRAMUSCULAR | Status: DC | PRN
  Administered 2016-04-13: 20 mL

## 2016-04-13 MED ORDER — OXYCODONE-ACETAMINOPHEN 5-325 MG PO TABS: 1.0000 | ORAL_TABLET | ORAL | 0 refills | Status: DC | PRN

## 2016-04-13 MED ORDER — FENTANYL CITRATE (PF) 100 MCG/2ML IJ SOLN
50.0000 ug | INTRAMUSCULAR | Status: DC | PRN
  Administered 2016-04-13: 100 ug via INTRAVENOUS
  Administered 2016-04-13: 50 ug via INTRAVENOUS

## 2016-04-13 MED ORDER — LIDOCAINE 2% (20 MG/ML) 5 ML SYRINGE
INTRAMUSCULAR | Status: DC | PRN
  Administered 2016-04-13: 100 mg via INTRAVENOUS

## 2016-04-13 MED ORDER — SODIUM CHLORIDE 0.9 % IR SOLN
Status: DC | PRN
  Administered 2016-04-13: 5000 mL

## 2016-04-13 MED ORDER — SCOPOLAMINE 1 MG/3DAYS TD PT72: 1.0000 | MEDICATED_PATCH | Freq: Once | TRANSDERMAL | Status: DC | PRN

## 2016-04-13 MED ORDER — FENTANYL CITRATE (PF) 100 MCG/2ML IJ SOLN
INTRAMUSCULAR | Status: AC
  Filled 2016-04-13: qty 2

## 2016-04-13 MED ORDER — SODIUM CHLORIDE 0.9 % IV SOLN
INTRAVENOUS | Status: DC
Start: 2016-04-13 — End: 2016-04-13

## 2016-04-13 MED ORDER — MIDAZOLAM HCL 2 MG/2ML IJ SOLN
1.0000 mg | INTRAMUSCULAR | Status: DC | PRN
  Administered 2016-04-13: 2 mg via INTRAVENOUS

## 2016-04-13 MED ORDER — PROPOFOL 10 MG/ML IV BOLUS
INTRAVENOUS | Status: DC | PRN
  Administered 2016-04-13: 200 mg via INTRAVENOUS

## 2016-04-13 MED ORDER — CEFAZOLIN SODIUM-DEXTROSE 2-4 GM/100ML-% IV SOLN
INTRAVENOUS | Status: AC
Start: 2016-04-13 — End: 2016-04-13
  Filled 2016-04-13: qty 100

## 2016-04-13 MED ORDER — PHENYLEPHRINE 40 MCG/ML (10ML) SYRINGE FOR IV PUSH (FOR BLOOD PRESSURE SUPPORT)
PREFILLED_SYRINGE | INTRAVENOUS | Status: AC
  Filled 2016-04-13: qty 10

## 2016-04-13 MED ORDER — LACTATED RINGERS IV SOLN
INTRAVENOUS | Status: DC
  Administered 2016-04-13: 12:00:00 via INTRAVENOUS

## 2016-04-13 MED ORDER — MIDAZOLAM HCL 2 MG/2ML IJ SOLN
INTRAMUSCULAR | Status: AC
  Filled 2016-04-13: qty 2

## 2016-04-13 MED ORDER — ONDANSETRON HCL 4 MG/2ML IJ SOLN
INTRAMUSCULAR | Status: DC | PRN
  Administered 2016-04-13: 4 mg via INTRAVENOUS

## 2016-04-13 MED ORDER — PHENYLEPHRINE HCL 10 MG/ML IJ SOLN
INTRAMUSCULAR | Status: DC | PRN
  Administered 2016-04-13 (×3): 80 ug via INTRAVENOUS

## 2016-04-13 MED ORDER — LIDOCAINE 2% (20 MG/ML) 5 ML SYRINGE
INTRAMUSCULAR | Status: AC
  Filled 2016-04-13: qty 5

## 2016-04-13 MED ORDER — DEXAMETHASONE SODIUM PHOSPHATE 10 MG/ML IJ SOLN
INTRAMUSCULAR | Status: DC | PRN
  Administered 2016-04-13: 10 mg via INTRAVENOUS

## 2016-04-13 MED ORDER — CEFAZOLIN SODIUM-DEXTROSE 2-4 GM/100ML-% IV SOLN
INTRAVENOUS | Status: AC
  Filled 2016-04-13: qty 100

## 2016-04-13 MED ORDER — PROPOFOL 10 MG/ML IV BOLUS
INTRAVENOUS | Status: AC
  Filled 2016-04-13: qty 20

## 2016-04-13 MED ORDER — CHLORHEXIDINE GLUCONATE 4 % EX LIQD: 60.0000 mL | Freq: Once | CUTANEOUS | Status: DC

## 2016-04-13 SURGICAL SUPPLY — 31 items
BANDAGE ACE 6X5 VEL STRL LF (GAUZE/BANDAGES/DRESSINGS) ×3 IMPLANT
BLADE 4.2CUDA (BLADE) IMPLANT
BLADE CUDA SHAVER 3.5 (BLADE) ×3 IMPLANT
BLADE CUTTER GATOR 3.5 (BLADE) IMPLANT
BLADE GREAT WHITE 4.2 (BLADE) IMPLANT
BNDG GAUZE ELAST 4 BULKY (GAUZE/BANDAGES/DRESSINGS) ×3 IMPLANT
DRAPE ARTHROSCOPY W/POUCH 90 (DRAPES) ×3 IMPLANT
DRSG EMULSION OIL 3X3 NADH (GAUZE/BANDAGES/DRESSINGS) ×3 IMPLANT
DURAPREP 26ML APPLICATOR (WOUND CARE) ×3 IMPLANT
ELECT MENISCUS 165MM 90D (ELECTRODE) IMPLANT
ELECT REM PT RETURN 9FT ADLT (ELECTROSURGICAL)
ELECTRODE REM PT RTRN 9FT ADLT (ELECTROSURGICAL) IMPLANT
GLOVE BIOGEL PI IND STRL 8.5 (GLOVE) IMPLANT
GLOVE BIOGEL PI INDICATOR 8.5 (GLOVE)
GLOVE ECLIPSE 8.0 STRL XLNG CF (GLOVE) ×3 IMPLANT
GLOVE SURG ORTHO 8.0 STRL STRW (GLOVE) IMPLANT
GOWN STRL REUS W/ TWL LRG LVL3 (GOWN DISPOSABLE) ×2 IMPLANT
GOWN STRL REUS W/TWL 2XL LVL3 (GOWN DISPOSABLE) ×3 IMPLANT
GOWN STRL REUS W/TWL LRG LVL3 (GOWN DISPOSABLE) ×1
HOLDER KNEE FOAM BLUE (MISCELLANEOUS) ×3 IMPLANT
KNEE WRAP E Z 3 GEL PACK (MISCELLANEOUS) ×3 IMPLANT
MANIFOLD NEPTUNE II (INSTRUMENTS) IMPLANT
PACK ARTHROSCOPY DSU (CUSTOM PROCEDURE TRAY) ×3 IMPLANT
PACK BASIN DAY SURGERY FS (CUSTOM PROCEDURE TRAY) ×3 IMPLANT
PENCIL BUTTON HOLSTER BLD 10FT (ELECTRODE) IMPLANT
PROBE BIPOLAR ATHRO 135MM 90D (MISCELLANEOUS) ×3 IMPLANT
SET ARTHROSCOPY TUBING (MISCELLANEOUS) ×1
SET ARTHROSCOPY TUBING LN (MISCELLANEOUS) ×2 IMPLANT
SUT ETHILON 4 0 PS 2 18 (SUTURE) IMPLANT
TOWEL OR 17X24 6PK STRL BLUE (TOWEL DISPOSABLE) ×3 IMPLANT
WATER STERILE IRR 1000ML POUR (IV SOLUTION) ×3 IMPLANT

## 2016-04-13 NOTE — H&P (Signed)
The recent History & Physical has been reviewed. I have personally examined the patient today. There is no interval change to the documented History & Physical. The patient would like to proceed with the procedure.  Valeria Batman 04/13/2016,  12:34 PM

## 2016-04-13 NOTE — Discharge Instructions (Signed)

## 2016-04-13 NOTE — Transfer of Care (Signed)
Immediate Anesthesia Transfer of Care Note  Patient: [name]  Procedure(s) Performed: Procedure(s): ARTHROSCOPY KNEE WITH DEBRIDEMENT (Left) KNEE ARTHROSCOPY WITH MEDIAL MENISECTOMY (Right)  Patient Location: PACU  Anesthesia Type:General  Level of Consciousness: sedated  Airway & Oxygen Therapy: Patient Spontanous Breathing and Patient connected to face mask oxygen  Post-op Assessment: Report given to RN and Post -op Vital signs reviewed and stable  Post vital signs: Reviewed and stable  Last Vitals:  Vitals:   04/13/16 1127  BP: (!) 159/101  Pulse: 75  Resp: 16  Temp: 36.5 C    Last Pain:  Vitals:   04/13/16 1127  TempSrc: Oral  PainSc: 5          Complications: No apparent anesthesia complications

## 2016-04-13 NOTE — Op Note (Signed)
PATIENT ID:      Lamarquis Opara III  MRN:     [id number] DOB/AGE:    December 25, 1965 / 51 y.o.       OPERATIVE REPORT    DATE OF PROCEDURE:  04/13/2016       PREOPERATIVE DIAGNOSIS:   LEFT KNEE MEDIAL MENISCUS TEAR, MEDIAL COMPARTMENT CHONDROMALACIA                                                       Estimated body mass index is 33.43 kg/m as calculated from the following:   Height as of this encounter: 5\' 10"  (1.778 m).   Weight as of this encounter: 233 lb (105.7 kg).     POSTOPERATIVE DIAGNOSIS:   LEFT KNEE MEDIAL MENISCUS TEAR,SAME                                                                     Estimated body mass index is 33.43 kg/m as calculated from the following:   Height as of this encounter: 5\' 10"  (1.778 m).   Weight as of this encounter: 233 lb (105.7 kg).     PROCEDURE:  Procedure(s): ARTHROSCOPY KNEE WITH DEBRIDEMENT MEDIAL MENISCUS     SURGEON:  Norlene Campbell, MD    ASSISTANT:   Jacqualine Code, PA-C   (Present and scrubbed throughout the case, critical for assistance with exposure, retraction, instrumentation, and closure.)          ANESTHESIA: local and general     DRAINS: none :      TOURNIQUET TIME: * No tourniquets in log *    COMPLICATIONS:  None   CONDITION:  stable [id number] PROCEDURE IN DETAIL:    Valeria Batman 04/13/2016, 1:22 PM

## 2016-04-13 NOTE — Anesthesia Preprocedure Evaluation (Signed)
Anesthesia Evaluation  Patient identified by MRN, date of birth, ID band Patient awake    Reviewed: Allergy & Precautions, NPO status , Patient's Chart, lab work & pertinent test results, reviewed documented beta blocker date and time   Airway Mallampati: II  TM Distance: >3 FB Neck ROM: Full    Dental no notable dental hx.    Pulmonary neg pulmonary ROS,    Pulmonary exam normal breath sounds clear to auscultation       Cardiovascular hypertension, +CHF  negative cardio ROS Normal cardiovascular exam Rhythm:Regular Rate:Normal     Neuro/Psych negative neurological ROS  negative psych ROS   GI/Hepatic negative GI ROS, Neg liver ROS,   Endo/Other  negative endocrine ROS  Renal/GU negative Renal ROS  negative genitourinary   Musculoskeletal negative musculoskeletal ROS (+)   Abdominal   Peds negative pediatric ROS (+)  Hematology negative hematology ROS (+)   Anesthesia Other Findings   Reproductive/Obstetrics negative OB ROS                             Anesthesia Physical Anesthesia Plan  ASA: III  Anesthesia Plan: General   Post-op Pain Management:    Induction: Intravenous  Airway Management Planned: LMA  Additional Equipment:   Intra-op Plan:   Post-operative Plan: Extubation in OR  Informed Consent: I have reviewed the patients History and Physical, chart, labs and discussed the procedure including the risks, benefits and alternatives for the proposed anesthesia with the patient or authorized representative who has indicated his/her understanding and acceptance.   Dental advisory given  Plan Discussed with: CRNA  Anesthesia Plan Comments:         Anesthesia Quick Evaluation

## 2016-04-13 NOTE — Anesthesia Postprocedure Evaluation (Signed)
Anesthesia Post Note  Patient: [name]  Procedure(s) Performed: Procedure(s) (LRB): ARTHROSCOPY KNEE WITH DEBRIDEMENT (Left) KNEE ARTHROSCOPY WITH MEDIAL MENISECTOMY (Right)  Patient location during evaluation: PACU Anesthesia Type: General Level of consciousness: awake and alert Pain management: pain level controlled Vital Signs Assessment: post-procedure vital signs reviewed and stable Respiratory status: spontaneous breathing, nonlabored ventilation and respiratory function stable Cardiovascular status: blood pressure returned to baseline and stable Postop Assessment: no signs of nausea or vomiting Anesthetic complications: no       Last Vitals:  Vitals:   04/13/16 1345 04/13/16 1427  BP: (!) 158/99 (!) 171/90  Pulse: 78 73  Resp: 16 18  Temp:  36.5 C    Last Pain:  Vitals:   04/13/16 1427  TempSrc:   PainSc: 0-No pain                 Lowella Curb

## 2016-04-13 NOTE — Anesthesia Procedure Notes (Signed)
Procedure Name: LMA Insertion Date/Time: 04/13/2016 12:42 PM Performed by: Caren Macadam Pre-anesthesia Checklist: Patient identified, Emergency Drugs available, Suction available and Patient being monitored Patient Re-evaluated:Patient Re-evaluated prior to inductionOxygen Delivery Method: Circle system utilized Preoxygenation: Pre-oxygenation with 100% oxygen Intubation Type: IV induction Ventilation: Mask ventilation without difficulty LMA: LMA inserted LMA Size: 5.0 Number of attempts: 1 Airway Equipment and Method: Bite block Placement Confirmation: positive ETCO2 and breath sounds checked- equal and bilateral Tube secured with: Tape Dental Injury: Teeth and Oropharynx as per pre-operative assessment

## 2016-04-14 ENCOUNTER — Encounter (HOSPITAL_BASED_OUTPATIENT_CLINIC_OR_DEPARTMENT_OTHER): Payer: Self-pay | Admitting: Orthopaedic Surgery

## 2016-04-14 NOTE — Op Note (Signed)
NAME:  [name], [name]                       ACCOUNT NO.:  MEDICAL RECORD NO.:  [id number]  LOCATION:                                 FACILITY:  PHYSICIAN:  Vonna Kotyk. Durward Fortes, M.D.    DATE OF BIRTH:  DATE OF PROCEDURE:  04/13/2016 DATE OF DISCHARGE:                              OPERATIVE REPORT   PREOPERATIVE DIAGNOSIS:  Tear medial meniscus, left knee with chondromalacia, medial compartment.  POSTOPERATIVE DIAGNOSIS:  Tear medial meniscus, left knee with chondromalacia, medial compartment.  PROCEDURE:  Diagnostic arthroscopy, left knee with partial medial meniscectomy.  SURGEON:  Vonna Kotyk. Durward Fortes, M.D.  ASSISTANT:  Aaron Edelman D. Petrarca, P.A.-C.  ANESTHESIA:  General with local knee block.  COMPLICATIONS:  None.  HISTORY:  A 51 year old gentleman, who has had persistent problem with his left knee to the point of compromise of vocational and avocational activities.  He is having predominantly medial joint pain without response of time, cortisone injection, and over-the-counter medicines. He has had an MRI scan demonstrating a grade 3 tear of the medial meniscus posteriorly associated with some chondromalacia in the medial compartment.  He is now to have an arthroscopic evaluation.  DESCRIPTION OF PROCEDURE:  [name] was met with his wife in the holding area, identified the left knee as appropriate operative site and marked it accordingly.  He was then transferred to room #7 and placed under general laryngal anesthesia without difficulty.  The left lower extremity was then placed in a thigh holder and leg was then prepped with DuraPrep in a thigh holder to the ankle.  Sterile draping was performed.  Time-out was called.  Xylocaine 0.5% with epinephrine was then injected for the arthroscopic portals just above the joint line, medial and lateral to the patellar tendon.  A 15 blade knife was used to obtain the 2 arthroscopic portals. The arthroscope was initially placed in  lateral portal.  The joint was then explored.  There was a clear yellow joint effusion.  Very minimal synovitis in the superior pouch.  The grade 1 changes of chondromalacia of the patella and none on the trochlea.  Gutters were clear.  In the medial compartment, there was an obvious area of chondromalacia about the size of a nickel along the lateral portion of the femur near the mid weightbearing area.  This was debrided with a Cuda shaver, representing grade 2 inferiorly, grade 3 changes.  The medial meniscus was then evaluated and was obviously torn with a radial tear at the posterior horn.  This was debrided with basket forceps, the Cuda shaver and then stabilized with the ArthroCare wand. It was carefully probed I felt.  I felt it was nicely tapered both anteriorly and posteriorly and no further unstable segments.  The intercondylar notch was evaluated without evidence of meniscal tear without ACL and PCL tearing.  Lateral compartment appeared to be relatively pristine without evidence of chondromalacia or tearing of the lateral meniscus.  The joint was then explored without loose material.  Puncture sites left open, infiltrated 0.25% Marcaine without epinephrine.  A sterile bulky dressing was applied, followed by an Ace bandage.  PLAN:  Oxycodone for  pain, crutches, ice pack.  Office 1 week.     Vonna Kotyk. Durward Fortes, M.D.     PWW/MEDQ  D:  04/13/2016  T:  04/13/2016  Job:  [id number]

## 2016-04-17 ENCOUNTER — Ambulatory Visit (INDEPENDENT_AMBULATORY_CARE_PROVIDER_SITE_OTHER): Payer: Self-pay | Admitting: Orthopaedic Surgery

## 2016-04-17 ENCOUNTER — Encounter (INDEPENDENT_AMBULATORY_CARE_PROVIDER_SITE_OTHER): Payer: Self-pay | Admitting: Orthopaedic Surgery

## 2016-04-17 DIAGNOSIS — G8929 Other chronic pain: Secondary | ICD-10-CM

## 2016-04-17 DIAGNOSIS — M25562 Pain in left knee: Secondary | ICD-10-CM

## 2016-04-17 NOTE — Progress Notes (Signed)
   Office Visit Note   Patient: Winferd Colgin III           Date of Birth: Oct 29, 1965           MRN: [id number] Visit Date: 04/17/2016              Requested by: No referring provider defined for this encounter. PCP: No PCP Per Patient   Assessment & Plan: Visit Diagnoses:4 days s/p left knee arthroscopy with partial medial menisectomy   Plan:dressing change, WBing as tolerated,ROM exercises, office 1 week, no work   Follow-Up Instructions: No Follow-up on file.   Orders:  No orders of the defined types were placed in this encounter.  No orders of the defined types were placed in this encounter.     Procedures: No procedures performed   Clinical Data: No additional findings.   Subjective: Chief Complaint  Patient presents with  . Left Knee - Routine Post Op    Mr. Dalal is a 51 year old male here status post 4 days left knee peripherial tear of the meniscus. Incision site is clean, dry and intact. Bandaids placed onto site. Pt ambulates with a cane, started walking on the left side on Sunday. Pt shows good ROM and very little swelling.    Review of Systems   Objective: Vital Signs: There were no vitals taken for this visit.  Physical Exam  Ortho Exam moderate effusion left knee. Full extension and flexion about 100. No instability. Arthroscopic portals intact without evidence of drainage or infection. No calf pain. No swelling distally. Neurovascular intact to the foot  Specialty Comments:  No specialty comments available.  Imaging: No results found.   PMFS History: Patient Active Problem List   Diagnosis Date Noted  . 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 HTN (hypertension) 05/25/2012   Past Medical History:  Diagnosis Date  . Chronic combined systolic and diastolic CHF (congestive heart failure) (HCC)    EF 25-30%  . Diastolic dysfunction     . Dyslipidemia   . Hyperlipidemia   . Hypertension   . Nonischemic dilated cardiomyopathy (HCC)    felt secondary to HTN with no ischemia on nuclear stress test 05/2012, EF 25-30%  . Shortness of breath     Family History  Problem Relation Age of Onset  . Hypertension Mother     Passed away when she is 42 secondary to hip surgery complications  . CAD Mother   . Hypertension Father     Past Surgical History:  Procedure Laterality Date  . KNEE ARTHROSCOPY Left 04/13/2016   Procedure: ARTHROSCOPY KNEE WITH DEBRIDEMENT;  Surgeon: Valeria Batman, MD;  Location: [facility];  Service: Orthopedics;  Laterality: Left;  . KNEE ARTHROSCOPY WITH MEDIAL MENISECTOMY Right 04/13/2016   Procedure: KNEE ARTHROSCOPY WITH MEDIAL MENISECTOMY;  Surgeon: Valeria Batman, MD;  Location: [facility];  Service: Orthopedics;  Laterality: Right;  . NASAL FRACTURE SURGERY     Social History   Occupational History  . Not on file.   Social History Main Topics  . Smoking status: Never Smoker  . Smokeless tobacco: Never Used  . Alcohol use 14.4 oz/week    24 Cans of beer per week     Comment: 2 beers per day  . Drug use: No  . Sexual activity: Yes

## 2016-04-18 ENCOUNTER — Telehealth (INDEPENDENT_AMBULATORY_CARE_PROVIDER_SITE_OTHER): Payer: Self-pay | Admitting: Orthopaedic Surgery

## 2016-04-18 NOTE — Telephone Encounter (Signed)
Patient's wife called and would like to know if the patient should take 3 aleve or 3 advil? Patient forgot which medication he was told to take.   They are also questioning as to where patient should go for physical therapy. Please call patient or his wife back with this information.

## 2016-04-18 NOTE — Telephone Encounter (Signed)
Called and spoke with wife as to 2 aleve in am and 2 pm or 2 advil every 4-6 hrs

## 2016-04-20 ENCOUNTER — Telehealth (INDEPENDENT_AMBULATORY_CARE_PROVIDER_SITE_OTHER): Payer: Self-pay | Admitting: Orthopaedic Surgery

## 2016-04-20 NOTE — Telephone Encounter (Signed)
Patient would like advice regarding going to [facility] for [employer]. Patient would be leaving this evening and coming back Monday.  Patient has appt 4/4 with you.  He states L knee is still somewhat swollen and sore (5 on a 1-10 scale).  He thinks he will end up doing a bit of walking.  Not sure if sure if he should make this trip with his family and really needs your advise before making the decision.

## 2016-04-20 NOTE — Telephone Encounter (Signed)
Ok to go-let pain be his guide

## 2016-04-20 NOTE — Telephone Encounter (Signed)
Please advise 

## 2016-04-20 NOTE — Telephone Encounter (Signed)
LVMOM  And advised what PW said below

## 2016-04-26 ENCOUNTER — Ambulatory Visit (INDEPENDENT_AMBULATORY_CARE_PROVIDER_SITE_OTHER): Payer: Self-pay | Admitting: Orthopedic Surgery

## 2016-04-26 ENCOUNTER — Encounter (INDEPENDENT_AMBULATORY_CARE_PROVIDER_SITE_OTHER): Payer: Self-pay | Admitting: Orthopedic Surgery

## 2016-04-26 VITALS — BP 146/107 | HR 99 | Ht 70.0 in | Wt 233.0 lb

## 2016-04-26 DIAGNOSIS — M25562 Pain in left knee: Secondary | ICD-10-CM

## 2016-04-26 DIAGNOSIS — M23322 Other meniscus derangements, posterior horn of medial meniscus, left knee: Secondary | ICD-10-CM

## 2016-04-26 NOTE — Progress Notes (Signed)
Office Visit Note   Patient: [name]           Date of Birth: May 08, 1965           MRN: [id number] Visit Date: 04/26/2016              Requested by: No referring provider defined for this encounter. PCP: No PCP Per Patient   Assessment & Plan: Visit Diagnoses:  1. Left knee pain, unspecified chronicity   2. Other meniscus derangements, posterior horn of medial meniscus, left knee     Plan:  #1: Continue anti-inflammatories and wean off of the oxycodone  #2: Physical therapy at [facility] #3: Remain out of work until seen in 3 weeks.  Follow-Up Instructions: Return in about 3 weeks (around 05/17/2016).   Orders:  No orders of the defined types were placed in this encounter.  No orders of the defined types were placed in this encounter.     Procedures: No procedures performed   Clinical Data: No additional findings.   Subjective: Chief Complaint  Patient presents with  . Left Knee - Routine Post Op    [name] is a 51 y.o male who presents today 13 days post op left knee arthroscopy with partial medial menisectomy. He is taking oxycodone prn and weaning off to Aleve as needed. He twisted knee coming out of the shower this morning.    Review of Systems   Objective: Vital Signs: BP (!) 146/107 (BP Location: Left Arm, Patient Position: Sitting)   Pulse 99   Ht 5\' 10"  (1.778 m)   Wt 233 lb (105.7 kg)   BMI 33.43 kg/m   Physical Exam  Ortho Exam  Range of motion lacks a few degrees shy of full extension but does arrange 105 of flexion. Trace to 1+ effusion. No calf pain. Neurovascular intact distally.  Specialty Comments:  No specialty comments available.  Imaging: No results found.   PMFS History: Patient Active Problem List   Diagnosis Date Noted  . 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 HTN (hypertension) 05/25/2012    Past Medical History:  Diagnosis Date  . Chronic combined systolic and diastolic CHF (congestive heart failure) (HCC)    EF 25-30%  . Diastolic dysfunction   . Dyslipidemia   . Hyperlipidemia   . Hypertension   . Nonischemic dilated cardiomyopathy (HCC)    felt secondary to HTN with no ischemia on nuclear stress test 05/2012, EF 25-30%  . Shortness of breath     Family History  Problem Relation Age of Onset  . Hypertension Mother     Passed away when she is 70 secondary to hip surgery complications  . CAD Mother   . Hypertension Father     Past Surgical History:  Procedure Laterality Date  . KNEE ARTHROSCOPY Left 04/13/2016   Procedure: ARTHROSCOPY KNEE WITH DEBRIDEMENT;  Surgeon: Valeria Batman, MD;  Location: [facility];  Service: Orthopedics;  Laterality: Left;  . KNEE ARTHROSCOPY WITH MEDIAL MENISECTOMY Right 04/13/2016   Procedure: KNEE ARTHROSCOPY WITH MEDIAL MENISECTOMY;  Surgeon: Valeria Batman, MD;  Location: [facility];  Service: Orthopedics;  Laterality: Right;  . NASAL FRACTURE SURGERY     Social History   Occupational History  . Not on file.   Social History Main Topics  . Smoking status: Never Smoker  . Smokeless tobacco: Never Used  . Alcohol use  14.4 oz/week    24 Cans of beer per week     Comment: 2 beers per day  . Drug use: No  . Sexual activity: Yes

## 2016-05-02 ENCOUNTER — Encounter (HOSPITAL_COMMUNITY): Payer: Self-pay | Admitting: *Deleted

## 2016-05-02 ENCOUNTER — Ambulatory Visit (HOSPITAL_COMMUNITY)
Admission: EM | Admit: 2016-05-02 | Discharge: 2016-05-02 | Disposition: A | Discharge status: Home / Self Care | Payer: No Typology Code available for payment source | Attending: Internal Medicine | Admitting: Internal Medicine

## 2016-05-02 DIAGNOSIS — I10 Essential (primary) hypertension: Secondary | ICD-10-CM

## 2016-05-02 DIAGNOSIS — Z760 Encounter for issue of repeat prescription: Secondary | ICD-10-CM

## 2016-05-02 MED ORDER — HYDROCHLOROTHIAZIDE 25 MG PO TABS: 25.0000 mg | ORAL_TABLET | Freq: Every day | ORAL | 0 refills | Status: DC

## 2016-05-02 MED ORDER — METOPROLOL TARTRATE 50 MG PO TABS: ORAL_TABLET | ORAL | 0 refills | Status: DC

## 2016-05-02 NOTE — ED Provider Notes (Signed)
CSN: [id number]     Arrival date & time 05/02/16  1405 History   None    Chief Complaint  Patient presents with  . Medication Refill   (Consider location/radiation/quality/duration/timing/severity/associated sxs/prior Treatment) The history is provided by the patient.  Medication Refill  Medications/supplies requested:  Metoprolol and HCTZ Reason for request:  Medications ran out Medications taken before: yes - see home medications   Patient has complete original prescription information: yes   Pt with H/O HTN and CHF presented to clinic with request for medication refills. Reports ran out of medication (metoprolol and HCTZ). Mild SOB with physical exertion secondary to left knee surgery relation to meniscus tear. Denies any other  physical complaints. Initial BP elevated. Repeat BP 161/93, HR 96.  Past Medical History:  Diagnosis Date  . Chronic combined systolic and diastolic CHF (congestive heart failure) (HCC)    EF 25-30%  . Diastolic dysfunction   . Dyslipidemia   . Hyperlipidemia   . Hypertension   . Nonischemic dilated cardiomyopathy (HCC)    felt secondary to HTN with no ischemia on nuclear stress test 05/2012, EF 25-30%  . Shortness of breath    Past Surgical History:  Procedure Laterality Date  . KNEE ARTHROSCOPY Left 04/13/2016   Procedure: ARTHROSCOPY KNEE WITH DEBRIDEMENT;  Surgeon: Valeria Batman, MD;  Location: [facility];  Service: Orthopedics;  Laterality: Left;  . KNEE ARTHROSCOPY WITH MEDIAL MENISECTOMY Right 04/13/2016   Procedure: KNEE ARTHROSCOPY WITH MEDIAL MENISECTOMY;  Surgeon: Valeria Batman, MD;  Location: [facility];  Service: Orthopedics;  Laterality: Right;  . NASAL FRACTURE SURGERY     Family History  Problem Relation Age of Onset  . Hypertension Mother     Passed away when she is 80 secondary to hip surgery complications  . CAD Mother   . Hypertension Father    Social History  Substance Use Topics  .  Smoking status: Never Smoker  . Smokeless tobacco: Never Used  . Alcohol use 14.4 oz/week    24 Cans of beer per week     Comment: 2 beers per day    Review of Systems  Constitutional: Negative.   HENT: Negative.   Eyes: Negative.   Respiratory: Negative.   Cardiovascular: Negative.   Gastrointestinal: Negative.   Genitourinary: Negative.   Skin: Negative.   Neurological: Negative.   Psychiatric/Behavioral: Negative.     Allergies  Patient has no known allergies.  Home Medications   Prior to Admission medications   Medication Sig Start Date End Date Taking? Authorizing Provider  atorvastatin (LIPITOR) 40 MG tablet Take 1 tablet (40 mg total) by mouth daily at 6 PM. 08/17/14   Quintella Reichert, MD  hydrochlorothiazide (HYDRODIURIL) 25 MG tablet Take 1 tablet (25 mg total) by mouth daily. 03/09/16   Hayden Rasmussen, NP  hydrochlorothiazide (HYDRODIURIL) 25 MG tablet Take 1 tablet (25 mg total) by mouth daily. 05/02/16   Daichi Moris, NP  metoprolol (LOPRESSOR) 50 MG tablet Take 1.5 tabs bid for BP. 03/09/16   Hayden Rasmussen, NP  metoprolol (LOPRESSOR) 50 MG tablet 1.5 tabs twice a day 05/02/16   Geanine Vandekamp, NP  Multiple Vitamin (MULTIVITAMIN WITH MINERALS) TABS tablet Take 1 tablet by mouth daily.    Historical Provider, MD  oxyCODONE-acetaminophen (ROXICET) 5-325 MG tablet Take 1-2 tablets by mouth every 4 (four) hours as needed for moderate pain or severe pain. 04/13/16   Jetty Peeks, PA-C  predniSONE (DELTASONE) 20 MG tablet Take  40 mg by mouth daily for 3 days, then 20mg  by mouth daily for 3 days, then 10mg  daily for 3 days Patient not taking: Reported on 04/26/2016 03/27/16   Garlon Hatchet, PA-C   Meds Ordered and Administered this Visit  Medications - No data to display  BP (!) 166/107 (BP Location: Right Arm) Comment: notified rn  Pulse (!) 115 Comment: notified  Temp 98.5 F (36.9 C) (Oral)   Resp 16   SpO2 96%  No data found.   Physical Exam  Constitutional: He  is oriented to person, place, and time. He appears well-developed and well-nourished.  HENT:  Head: Normocephalic.  Eyes: Pupils are equal, round, and reactive to light.  Cardiovascular: Normal rate, regular rhythm and normal heart sounds.   Pulmonary/Chest: Effort normal and breath sounds normal.  Abdominal: Soft. Bowel sounds are normal.  Neurological: He is alert and oriented to person, place, and time.  Skin: Skin is warm.  Psychiatric: He has a normal mood and affect.    Urgent Care Course     Procedures (including critical care time)  Labs Review Labs Reviewed - No data to display  Imaging Review No results found.   Visual Acuity Review  Right Eye Distance:   Left Eye Distance:   Bilateral Distance:    Right Eye Near:   Left Eye Near:    Bilateral Near:         MDM   1. Essential hypertension   2. Encounter for medication refill   Refills provided x 1 month with education to FU with PCP for further refills. BP repeated:161/93, HR 96    Elaisha Zahniser, NP 05/02/16 1518

## 2016-05-02 NOTE — ED Triage Notes (Signed)
Needs   Refill  Of  His   bp  meds   Out  X  3  Days       dennies   Any  Symptoms   Except  Little  Short  Of  breath

## 2016-05-17 ENCOUNTER — Encounter (INDEPENDENT_AMBULATORY_CARE_PROVIDER_SITE_OTHER): Payer: Self-pay | Admitting: Orthopedic Surgery

## 2016-05-17 ENCOUNTER — Ambulatory Visit (INDEPENDENT_AMBULATORY_CARE_PROVIDER_SITE_OTHER): Payer: Self-pay | Admitting: Orthopedic Surgery

## 2016-05-17 VITALS — BP 162/93 | HR 75 | Ht 70.0 in | Wt 233.0 lb

## 2016-05-17 DIAGNOSIS — S83242D Other tear of medial meniscus, current injury, left knee, subsequent encounter: Secondary | ICD-10-CM

## 2016-05-17 DIAGNOSIS — Z9889 Other specified postprocedural states: Secondary | ICD-10-CM

## 2016-05-17 NOTE — Progress Notes (Signed)
Office Visit Note   Patient: [name]           Date of Birth: 1965/12/08           MRN: [id number] Visit Date: 05/17/2016              Requested by: No referring provider defined for this encounter. PCP: No PCP Per Patient   Assessment & Plan: Visit Diagnoses:  1. S/P left knee surgery   2. 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  Follow-Up Instructions: Return in about 3 weeks (around 06/07/2016).   Orders:  No orders of the defined types were placed in this encounter.  No orders of the defined types were placed in this encounter.     Procedures: No procedures performed   Clinical Data: No additional findings.   Subjective: Chief Complaint  Patient presents with  . Left Knee - Follow-up  . Follow-up    3 week follow up. Feels like he is improving, He is doing physical therapy with [employer]. Just started weighted exercises yesterday. Taking Advil or Aleve prn basis after therapy. Needs repeat evaluation of work status.    [name] is 5 weeks s/p left knee arthroscopy with partial medial menisectomy. He is doing very well postoperatively. He's ambulating without any assistive devices. Working in physical therapy now starting to do increasing strengthening exercises. Pain appears to be controlled. Happy with his results so far.    Review of Systems   Objective: Vital Signs: BP (!) 162/93 (BP Location: Right Arm, Patient Position: Sitting)   Pulse 75   Ht 5\' 10"  (1.778 m)   Wt 233 lb (105.7 kg)   BMI 33.43 kg/m   Physical Exam  Ortho Exam  Exam today reveals a trace effusion at best. His  wounds are well-healed. Range of motion from near full extension to 116. Calf is supple and nontender. Neurovascular intact distally.  Specialty Comments:  No specialty comments available.  Imaging: No results found.   PMFS History: Patient Active Problem List   Diagnosis Date Noted  . 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 HTN (hypertension) 05/25/2012   Past Medical History:  Diagnosis Date  . Chronic combined systolic and diastolic CHF (congestive heart failure) (HCC)    EF 25-30%  . Diastolic dysfunction   . Dyslipidemia   . Hyperlipidemia   . Hypertension   . Nonischemic dilated cardiomyopathy (HCC)    felt secondary to HTN with no ischemia on nuclear stress test 05/2012, EF 25-30%  . Shortness of breath     Family History  Problem Relation Age of Onset  . Hypertension Mother     Passed away when she is 33 secondary to hip surgery complications  . CAD Mother   . Hypertension Father     Past Surgical History:  Procedure Laterality Date  . KNEE ARTHROSCOPY Left 04/13/2016   Procedure: ARTHROSCOPY KNEE WITH DEBRIDEMENT;  Surgeon: Valeria Batman, MD;  [facility];  Service: Orthopedics;  Laterality: Left;  . KNEE ARTHROSCOPY WITH MEDIAL MENISECTOMY Right 04/13/2016   Procedure: KNEE ARTHROSCOPY WITH MEDIAL MENISECTOMY;  Surgeon: Valeria Batman, MD;  Location: [facility];  Service: Orthopedics;  Laterality: Right;  . NASAL FRACTURE SURGERY     Social History   Occupational History  . Not on file.   Social History Main Topics  . Smoking status: Never Smoker  . Smokeless tobacco: Never Used  . Alcohol use 14.4 oz/week    24 Cans of beer per week     Comment: 2 beers per day  . Drug use: No  . Sexual activity: Yes

## 2016-06-09 ENCOUNTER — Encounter (INDEPENDENT_AMBULATORY_CARE_PROVIDER_SITE_OTHER): Payer: Self-pay | Admitting: Orthopaedic Surgery

## 2016-06-09 ENCOUNTER — Ambulatory Visit (INDEPENDENT_AMBULATORY_CARE_PROVIDER_SITE_OTHER): Payer: Self-pay | Admitting: Orthopaedic Surgery

## 2016-06-09 VITALS — BP 151/76 | HR 85 | Ht 69.0 in | Wt 253.0 lb

## 2016-06-09 DIAGNOSIS — M25562 Pain in left knee: Secondary | ICD-10-CM

## 2016-06-09 DIAGNOSIS — G8929 Other chronic pain: Secondary | ICD-10-CM

## 2016-06-09 NOTE — Progress Notes (Signed)
Office Visit Note   Patient: [name]           Date of Birth: 09-04-1965           MRN: [id number] Visit Date: 06/09/2016              Requested by: No referring provider defined for this encounter. PCP: Patient, No Pcp Per   Assessment & Plan: Visit Diagnoses:  1. Chronic pain of left knee   6 weeks status post left knee arthroscopy with tear of the medial meniscus and tricompartmental genic changes. Doing very well.  Plan: Note to return to work on Monday, May 21 with restrictions for 2 weeks. Return in 2 weeks to evaluate work status  Follow-Up Instructions: Return in about 2 weeks (around 06/23/2016).   Orders:  No orders of the defined types were placed in this encounter.  No orders of the defined types were placed in this encounter.     Procedures: No procedures performed   Clinical Data: No additional findings.   Subjective: Chief Complaint  Patient presents with  . Left Knee - Routine Post Op    [name] is a 51 y o status post 8 weeks Tear of medial meniscus of left knee. He is doing very well, no edema, pain or tenderness.     Very happy with present progress and wishes to return to work  HPI  Review of Systems   Objective: Vital Signs: BP (!) 151/76   Pulse 85   Ht 5\' 9"  (1.753 m)   Wt 253 lb (114.8 kg)   BMI 37.36 kg/m   Physical Exam  Ortho Exam left knee without effusion. Arthroscopic portals of healed nicely. No medial lateral instability. No swelling distally. Neurovascular exam intact. No limp. Full Range of motion.  Specialty Comments:  No specialty comments available.  Imaging: No results found.   PMFS History: Patient Active Problem List   Diagnosis Date Noted  . 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 HTN (hypertension) 05/25/2012   Past Medical History:  Diagnosis Date  . Chronic combined systolic and  diastolic CHF (congestive heart failure) (HCC)    EF 25-30%  . Diastolic dysfunction   . Dyslipidemia   . Hyperlipidemia   . Hypertension   . Nonischemic dilated cardiomyopathy (HCC)    felt secondary to HTN with no ischemia on nuclear stress test 05/2012, EF 25-30%  . Shortness of breath     Family History  Problem Relation Age of Onset  . Hypertension Mother        Passed away when she is 66 secondary to hip surgery complications  . CAD Mother   . Hypertension Father     Past Surgical History:  Procedure Laterality Date  . KNEE ARTHROSCOPY Left 04/13/2016   Procedure: ARTHROSCOPY KNEE WITH DEBRIDEMENT;  Surgeon: Valeria Batman, MD;  Location: [facility];  Service: Orthopedics;  Laterality: Left;  . KNEE ARTHROSCOPY WITH MEDIAL MENISECTOMY Right 04/13/2016   Procedure: KNEE ARTHROSCOPY WITH MEDIAL MENISECTOMY;  Surgeon: Valeria Batman, MD;  Location: [facility];  Service: Orthopedics;  Laterality: Right;  . NASAL FRACTURE SURGERY     Social History   Occupational History  . Not on file.   Social History Main Topics  . Smoking status: Never Smoker  . Smokeless tobacco: Never Used  . Alcohol use 14.4 oz/week    24  Cans of beer per week     Comment: 2 beers per day  . Drug use: No  . Sexual activity: Yes     Valeria Batman, MD   Note - This record has been created using [employer].  Chart creation errors have been sought, but may not always  have been located. Such creation errors do not reflect on  the standard of medical care.

## 2016-06-10 ENCOUNTER — Ambulatory Visit (HOSPITAL_COMMUNITY)
Admission: EM | Admit: 2016-06-10 | Discharge: 2016-06-10 | Disposition: A | Discharge status: Home / Self Care | Payer: No Typology Code available for payment source | Attending: Internal Medicine | Admitting: Internal Medicine

## 2016-06-10 ENCOUNTER — Encounter (HOSPITAL_COMMUNITY): Payer: Self-pay | Admitting: Family Medicine

## 2016-06-10 DIAGNOSIS — I10 Essential (primary) hypertension: Secondary | ICD-10-CM

## 2016-06-10 DIAGNOSIS — Z760 Encounter for issue of repeat prescription: Secondary | ICD-10-CM

## 2016-06-10 MED ORDER — HYDROCHLOROTHIAZIDE 25 MG PO TABS: 25.0000 mg | ORAL_TABLET | Freq: Every day | ORAL | 0 refills | Status: DC

## 2016-06-10 MED ORDER — METOPROLOL TARTRATE 50 MG PO TABS: ORAL_TABLET | ORAL | 0 refills | Status: DC

## 2016-06-10 MED ORDER — ATORVASTATIN CALCIUM 40 MG PO TABS: 40.0000 mg | ORAL_TABLET | Freq: Every day | ORAL | 1 refills | Status: DC

## 2016-06-10 NOTE — Discharge Instructions (Signed)
I have refilled your medicines. I recommend he keep your appointment on June 4 with your new primary care provider. May return to clinic at anytime as needed.

## 2016-06-10 NOTE — ED Triage Notes (Signed)
Pt here for refill on meds. sts he has an appointment on June 4th for primary care.

## 2016-06-10 NOTE — ED Provider Notes (Signed)
CSN: [id number]     Arrival date & time 06/10/16  1602 History   None    Chief Complaint  Patient presents with  . Medication Refill   (Consider location/radiation/quality/duration/timing/severity/associated sxs/prior Treatment) 51 year old male presents to clinic with a chief complaint of needing his medicines refilled. Has a new primary care provider, has appointment scheduled for 06/26/2016. Has no complaints at time of assessment, and review of systems are negative   The history is provided by the patient.  Medication Refill  Medications/supplies requested:  HCTZ, Metoprolol, Atorvastatin Reason for request:  Clinic/provider not available Medications taken before: yes - see home medications   Patient has complete original prescription information: yes     Past Medical History:  Diagnosis Date  . Chronic combined systolic and diastolic CHF (congestive heart failure) (HCC)    EF 25-30%  . Diastolic dysfunction   . Dyslipidemia   . Hyperlipidemia   . Hypertension   . Nonischemic dilated cardiomyopathy (HCC)    felt secondary to HTN with no ischemia on nuclear stress test 05/2012, EF 25-30%  . Shortness of breath    Past Surgical History:  Procedure Laterality Date  . KNEE ARTHROSCOPY Left 04/13/2016   Procedure: ARTHROSCOPY KNEE WITH DEBRIDEMENT;  Surgeon: Valeria Batman, MD;  Location: [facility];  Service: Orthopedics;  Laterality: Left;  . KNEE ARTHROSCOPY WITH MEDIAL MENISECTOMY Right 04/13/2016   Procedure: KNEE ARTHROSCOPY WITH MEDIAL MENISECTOMY;  Surgeon: Valeria Batman, MD;  Location: [facility];  Service: Orthopedics;  Laterality: Right;  . NASAL FRACTURE SURGERY     Family History  Problem Relation Age of Onset  . Hypertension Mother        Passed away when she is 35 secondary to hip surgery complications  . CAD Mother   . Hypertension Father    Social History  Substance Use Topics  . Smoking status: Never Smoker  .  Smokeless tobacco: Never Used  . Alcohol use 14.4 oz/week    24 Cans of beer per week     Comment: 2 beers per day    Review of Systems  Constitutional: Negative.   HENT: Negative.   Respiratory: Negative.   Cardiovascular: Negative.   Gastrointestinal: Negative.   Musculoskeletal: Negative.   Skin: Negative.   Neurological: Negative.     Allergies  Patient has no known allergies.  Home Medications   Prior to Admission medications   Medication Sig Start Date End Date Taking? Authorizing Provider  atorvastatin (LIPITOR) 40 MG tablet Take 1 tablet (40 mg total) by mouth daily at 6 PM. 06/10/16   Dorena Bodo, NP  hydrochlorothiazide (HYDRODIURIL) 25 MG tablet Take 1 tablet (25 mg total) by mouth daily. 06/10/16   Dorena Bodo, NP  metoprolol tartrate (LOPRESSOR) 50 MG tablet Take 1.5 tabs bid for BP. 06/10/16   Dorena Bodo, NP  Multiple Vitamin (MULTIVITAMIN WITH MINERALS) TABS tablet Take 1 tablet by mouth daily.    [provider]   Meds Ordered and Administered this Visit  Medications - No data to display  BP (!) 124/98   Pulse 87   Temp 98.1 F (36.7 C)   Resp 18   SpO2 98%  No data found.   Physical Exam  Constitutional: He is oriented to person, place, and time. He appears well-developed and well-nourished. No distress.  HENT:  Head: Normocephalic.  Right Ear: External ear normal.  Left Ear: External ear normal.  Eyes: Conjunctivae are normal.  Neck: Normal range  of motion.  Cardiovascular: Normal rate and regular rhythm.   Pulmonary/Chest: Effort normal and breath sounds normal.  Neurological: He is alert and oriented to person, place, and time.  Skin: Skin is warm and dry. Capillary refill takes less than 2 seconds. He is not diaphoretic.  Psychiatric: He has a normal mood and affect. His behavior is normal.  Nursing note and vitals reviewed.   Urgent Care Course     Procedures (including critical care time)  Labs Review Labs  Reviewed - No data to display  Imaging Review No results found.    MDM   1. Medication refill    Patient's medicines were refilled, strongly encouraged to keep his appointment with primary care on 06/26/2016. Last lab work was reviewed in epic, no abnormalities seen CBC, CMP, or other labs.     Dorena Bodo, NP 06/10/16 1751

## 2016-06-26 ENCOUNTER — Encounter (INDEPENDENT_AMBULATORY_CARE_PROVIDER_SITE_OTHER): Payer: Self-pay | Admitting: Orthopaedic Surgery

## 2016-06-26 ENCOUNTER — Ambulatory Visit: Payer: No Typology Code available for payment source | Admitting: Family Medicine

## 2016-06-26 ENCOUNTER — Ambulatory Visit (INDEPENDENT_AMBULATORY_CARE_PROVIDER_SITE_OTHER): Payer: Self-pay | Admitting: Orthopaedic Surgery

## 2016-06-26 VITALS — BP 178/95 | HR 89 | Resp 14 | Ht 69.0 in | Wt 239.0 lb

## 2016-06-26 DIAGNOSIS — M25562 Pain in left knee: Secondary | ICD-10-CM

## 2016-06-26 DIAGNOSIS — G8929 Other chronic pain: Secondary | ICD-10-CM

## 2016-06-26 NOTE — Progress Notes (Signed)
Office Visit Note   Patient: [name]           Date of Birth: 22-Oct-1965           MRN: [id number] Visit Date: 06/26/2016              Requested by: No referring provider defined for this encounter. PCP: Patient, No Pcp Per   Assessment & Plan: Visit Diagnoses:  1. Chronic pain of left knee   2 months status post left knee arthroscopy with partial medial meniscectomy. Had tricompartmental degenerative changes  Plan: Mr. Ki is doing quite well. He has occasional pop and click but no significant discomfort. He would like to return to work full time full duty on 07/03/2016. We'll give him a note to that effect and plan to see him back as needed. He is aware of the possibility of recurrent pain given the arthritic changes in his knee  Follow-Up Instructions: Return if symptoms worsen or fail to improve.   Orders:  No orders of the defined types were placed in this encounter.  No orders of the defined types were placed in this encounter.     Procedures: No procedures performed   Clinical Data: No additional findings.   Subjective: Chief Complaint  Patient presents with  . Left Knee - Routine Post Op, Edema    [name] is a 44 y o that is 10 weeks Left knee arthroscopy.He relates he feel pain in the back of the knee and stiffness even though he goes to gym 3 x week.   [name] is doing well enough that he like to return to full duty work next Monday, June 11 give him a note to that effect  HPI  Review of Systems   Objective: Vital Signs: BP (!) 178/95   Pulse 89   Resp 14   Ht 5\' 9"  (1.753 m)   Wt 239 lb (108.4 kg)   BMI 35.29 kg/m   Physical Exam  Ortho Exam left knee without effusion. No medial lateral joint pain. No patella crepitation. No instability. No calf pain. No swelling distally. Neurovascular exam intact. Walks without a limp.  Specialty Comments:  No specialty comments available.  Imaging: No results found.   PMFS  History: Patient Active Problem List   Diagnosis Date Noted  . 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 HTN (hypertension) 05/25/2012   Past Medical History:  Diagnosis Date  . Chronic combined systolic and diastolic CHF (congestive heart failure) (HCC)    EF 25-30%  . Diastolic dysfunction   . Dyslipidemia   . Hyperlipidemia   . Hypertension   . Nonischemic dilated cardiomyopathy (HCC)    felt secondary to HTN with no ischemia on nuclear stress test 05/2012, EF 25-30%  . Shortness of breath     Family History  Problem Relation Age of Onset  . Hypertension Mother        Passed away when she is 42 secondary to hip surgery complications  . CAD Mother   . Hypertension Father     Past Surgical History:  Procedure Laterality Date  . KNEE ARTHROSCOPY Left 04/13/2016   Procedure: ARTHROSCOPY KNEE WITH DEBRIDEMENT;  Surgeon: Valeria Batman, MD;  Location: [facility];  Service: Orthopedics;  Laterality: Left;  . KNEE ARTHROSCOPY WITH MEDIAL MENISECTOMY Right 04/13/2016   Procedure: KNEE ARTHROSCOPY WITH MEDIAL MENISECTOMY;  Surgeon: Claude Manges  Cleophas Dunker, MD;  Location: [facility];  Service: Orthopedics;  Laterality: Right;  . NASAL FRACTURE SURGERY     Social History   Occupational History  . Not on file.   Social History Main Topics  . Smoking status: Never Smoker  . Smokeless tobacco: Never Used  . Alcohol use 14.4 oz/week    24 Cans of beer per week     Comment: 2 beers per day  . Drug use: No  . Sexual activity: Yes     Valeria Batman, MD   Note - This record has been created using [employer].  Chart creation errors have been sought, but may not always  have been located. Such creation errors do not reflect on  the standard of medical care.

## 2016-06-27 ENCOUNTER — Encounter: Payer: Self-pay | Admitting: Family Medicine

## 2016-06-27 ENCOUNTER — Ambulatory Visit (INDEPENDENT_AMBULATORY_CARE_PROVIDER_SITE_OTHER): Payer: Self-pay | Admitting: Family Medicine

## 2016-06-27 DIAGNOSIS — F1010 Alcohol abuse, uncomplicated: Secondary | ICD-10-CM

## 2016-06-27 DIAGNOSIS — I10 Essential (primary) hypertension: Secondary | ICD-10-CM

## 2016-06-27 DIAGNOSIS — I420 Dilated cardiomyopathy: Secondary | ICD-10-CM

## 2016-06-27 NOTE — Progress Notes (Signed)
[name] is a 51 y.o. male is here to [facility].   Patient Care Team: Briscoe Deutscher, DO as PCP - General (Family Medicine)   History of Present Illness:   [name], cma is acting as a Education administrator for Dr. Briscoe Deutscher.  HYPERTENSION:  This has been an intermittent controlled/uncontrolled issue for many years. He use to follow Awanda Mink, MD with [facility] but last saw her over 1 year ago. He is currently uninsured, went to an [facility] on 06/10/2016, asymptomatic, to have his BP medications refilled. He is currently taking BP medication on file as directed. Denies any chest pain or weakness. Reports SOB with exertion. No past history of smoking. Mother is deceased with history of 2 MIs and open heart surgery. Father is living with hypertension. He is checking home blood pressures with readings of 150s/80s. Only checks once per week. No recent headaches.  Surgery on LT meniscus on 04/13/2016.    Health Maintenance Due  Topic Date Due  . Hepatitis C Screening  March 13, 1965  . HIV Screening  09/20/1978  . TETANUS/TDAP  09/20/1982  . COLONOSCOPY  09/19/2013   PMHx, SurgHx, SocialHx, Medications, and Allergies were reviewed in the Visit Navigator and updated as appropriate.   Past Medical History:  Diagnosis Date  . Chronic combined systolic and diastolic CHF (congestive heart failure) (HCC)    EF 25-30%  . Diastolic dysfunction   . Dyslipidemia   . Hyperlipidemia   . Hypertension   . Nonischemic dilated cardiomyopathy ([place])    felt secondary to HTN with no ischemia on nuclear stress test 05/2012, EF 25-30%  . Shortness of breath    Past Surgical History:  Procedure Laterality Date  . KNEE ARTHROSCOPY Left 04/13/2016   Procedure: ARTHROSCOPY KNEE WITH DEBRIDEMENT;  Surgeon: Garald Balding, MD;  Location: [place];  Service: Orthopedics;  Laterality: Left;  . KNEE ARTHROSCOPY WITH MEDIAL MENISECTOMY Right 04/13/2016   Procedure: KNEE  ARTHROSCOPY WITH MEDIAL MENISECTOMY;  Surgeon: Garald Balding, MD;  Location: [place];  Service: Orthopedics;  Laterality: Right;  . NASAL FRACTURE SURGERY     Family History  Problem Relation Age of Onset  . Hypertension Mother        Passed away when she is 7 secondary to hip surgery complications  . CAD Mother   . Heart attack Mother   . Hypertension Father   . Heart attack Maternal Grandmother    Social History  Substance Use Topics  . Smoking status: Never Smoker  . Smokeless tobacco: Never Used  . Alcohol use 14.4 oz/week    24 Cans of beer per week     Comment: 2 beers per day   Current Medications and Allergies:   .  atorvastatin (LIPITOR) 40 MG tablet, Take 1 tablet (40 mg total) by mouth daily at 6 PM., Disp: 30 tablet, Rfl: 1 .  hydrochlorothiazide (HYDRODIURIL) 25 MG tablet, Take 1 tablet (25 mg total) by mouth daily., Disp: 30 tablet, Rfl: 0 .  metoprolol tartrate (LOPRESSOR) 50 MG tablet, Take 1.5 tabs bid for BP., Disp: 90 tablet, Rfl: 0 .  Multiple Vitamin (MULTIVITAMIN WITH MINERALS) TABS tablet, Take 1 tablet by mouth daily., Disp: , Rfl:   No Known Allergies   Review of Systems:   Review of Systems  Constitutional: Negative for chills, diaphoresis, fever, malaise/fatigue and weight loss.  HENT: Negative for congestion, nosebleeds and sinus pain.   Eyes: Negative for blurred vision,  double vision, photophobia, pain, discharge and redness.  Respiratory: Negative for cough, hemoptysis, sputum production, shortness of breath, wheezing and stridor.   Cardiovascular: Negative for chest pain, palpitations, claudication and leg swelling.  Gastrointestinal: Negative for abdominal pain, blood in stool, constipation, diarrhea, heartburn, melena, nausea and vomiting.  Genitourinary: Negative for dysuria, flank pain, frequency, hematuria and urgency.  Musculoskeletal: Negative for back pain, joint pain, myalgias and neck pain.  Skin: Negative for  rash.  Neurological: Negative for dizziness, tingling, tremors, sensory change, speech change, focal weakness, seizures, loss of consciousness and headaches.  Endo/Heme/Allergies: Negative for environmental allergies and polydipsia. Does not bruise/bleed easily.  Psychiatric/Behavioral: Negative for depression, hallucinations, memory loss, substance abuse and suicidal ideas. The patient is not nervous/anxious and does not have insomnia.     Vitals:  There were no vitals filed for this visit.  BP 170/110 HR86 Repeat 160/100 HR 84 There is no height or weight on file to calculate BMI.  Physical Exam:   Physical Exam  Constitutional: He is oriented to person, place, and time. He appears well-developed and well-nourished. No distress.  HENT:  Head: Normocephalic and atraumatic.  Right Ear: External ear normal.  Left Ear: External ear normal.  Nose: Nose normal.  Mouth/Throat: Oropharynx is clear and moist.  Eyes: Conjunctivae and EOM are normal. Pupils are equal, round, and reactive to light.  Neck: Normal range of motion. Neck supple.  Cardiovascular: Normal rate, regular rhythm and intact distal pulses.   Pulmonary/Chest: Effort normal.  Abdominal: Soft. Bowel sounds are normal.  Musculoskeletal: Normal range of motion.  Neurological: He is alert and oriented to person, place, and time.  Skin: Skin is warm and dry.  Psychiatric: He has a normal mood and affect. His behavior is normal. Judgment and thought content normal.  Nursing note and vitals reviewed.  Results for orders placed or performed in visit on 06/27/16  Comp Met (CMET)  Result Value Ref Range   Sodium 142 135 - 145 mEq/L   Potassium 4.1 3.5 - 5.1 mEq/L   Chloride 103 96 - 112 mEq/L   CO2 30 19 - 32 mEq/L   Glucose, Bld 98 70 - 99 mg/dL   BUN 20 6 - 23 mg/dL   Creatinine, Ser 1.40 0.40 - 1.50 mg/dL   Total Bilirubin 0.5 0.2 - 1.2 mg/dL   Alkaline Phosphatase 74 39 - 117 U/L   AST 24 0 - 37 U/L   ALT 25 0 - 53 U/L    Total Protein 7.7 6.0 - 8.3 g/dL   Albumin 4.8 3.5 - 5.2 g/dL   Calcium 10.7 (H) 8.4 - 10.5 mg/dL   GFR 68.24 >60.00 mL/min  HgB A1c  Result Value Ref Range   Hgb A1c MFr Bld 5.4 4.6 - 6.5 %    Assessment and Plan:   Nyshawn was seen today for establish care and er follow up hypertension.  Diagnoses and all orders for this visit:  Morbid obesity ([place]) Comments: The patient is asked to make an attempt to improve diet and exercise patterns to aid in medical management of this problem.  Orders: -     HgB A1c  Essential hypertension Comments: Use of agents associated with hypertension: none.   Cardiovascular risk factors: hypertension, male gender, obesity (BMI >= 30 kg/m2) and sedentary lifestyle.  History of target organ damage: heart failure.  Screening labs for initial evaluation: CMP, A1c. Dietary sodium restriction. Regular aerobic exercise. Check blood pressures daily and record. Follow up: a few  week and as needed..  Orders: -     Comp Met (CMET)  Nonischemic dilated cardiomyopathy ([place]) Comments: ECHO on 08/12/14 with EF 55-60%.  Alcohol use disorder, mild, abuse Comments: We discussed the importance of decreasing alcohol consumption, especially in the setting of HTN and heart disease. Right now, he is drinking four 24 oz beer daily. His goal is to decreased by half before our next visit.     . Reviewed expectations re: course of current medical issues. . Discussed self-management of symptoms. . Outlined signs and symptoms indicating need for more acute intervention. . Patient verbalized understanding and all questions were answered. Marland Kitchen Health Maintenance issues including appropriate healthy diet, exercise, and smoking avoidance were discussed with patient. . See orders for this visit as documented in the electronic medical record. . Patient received an After Visit Summary.  CMA served as Education administrator during this visit. History, Physical, and Plan performed by  medical provider. The above documentation has been reviewed and is accurate and complete. Briscoe Deutscher, D.O.  Briscoe Deutscher, DO [place], [facility] 06/30/2016

## 2016-06-28 LAB — COMPREHENSIVE METABOLIC PANEL
ALT: 25 U/L (ref 0–53)
AST: 24 U/L (ref 0–37)
Albumin: 4.8 g/dL (ref 3.5–5.2)
Alkaline Phosphatase: 74 U/L (ref 39–117)
BUN: 20 mg/dL (ref 6–23)
CO2: 30 mEq/L (ref 19–32)
Calcium: 10.7 mg/dL — ABNORMAL HIGH (ref 8.4–10.5)
Chloride: 103 mEq/L (ref 96–112)
Creatinine, Ser: 1.4 mg/dL (ref 0.40–1.50)
GFR: 68.24 mL/min (ref >60.00)
Glucose, Bld: 98 mg/dL (ref 70–99)
Potassium: 4.1 mEq/L (ref 3.5–5.1)
Sodium: 142 mEq/L (ref 135–145)
Total Bilirubin: 0.5 mg/dL (ref 0.2–1.2)
Total Protein: 7.7 g/dL (ref 6.0–8.3)

## 2016-06-28 LAB — HEMOGLOBIN A1C: Hgb A1c MFr Bld: 5.4 % (ref 4.6–6.5)

## 2016-06-30 DIAGNOSIS — F1010 Alcohol abuse, uncomplicated: Secondary | ICD-10-CM | POA: Insufficient documentation

## 2016-07-13 ENCOUNTER — Emergency Department (HOSPITAL_COMMUNITY): Payer: Self-pay

## 2016-07-13 ENCOUNTER — Emergency Department (HOSPITAL_COMMUNITY)
Admission: EM | Admit: 2016-07-13 | Discharge: 2016-07-13 | Disposition: A | Discharge status: Home / Self Care | Payer: Self-pay | Attending: Emergency Medicine | Admitting: Emergency Medicine

## 2016-07-13 ENCOUNTER — Encounter (HOSPITAL_COMMUNITY): Payer: Self-pay | Admitting: Emergency Medicine

## 2016-07-13 DIAGNOSIS — Y939 Activity, unspecified: Secondary | ICD-10-CM | POA: Insufficient documentation

## 2016-07-13 DIAGNOSIS — Z79899 Other long term (current) drug therapy: Secondary | ICD-10-CM | POA: Insufficient documentation

## 2016-07-13 DIAGNOSIS — W182XXA Fall in (into) shower or empty bathtub, initial encounter: Secondary | ICD-10-CM | POA: Insufficient documentation

## 2016-07-13 DIAGNOSIS — I5042 Chronic combined systolic (congestive) and diastolic (congestive) heart failure: Secondary | ICD-10-CM | POA: Insufficient documentation

## 2016-07-13 DIAGNOSIS — Y929 Unspecified place or not applicable: Secondary | ICD-10-CM | POA: Insufficient documentation

## 2016-07-13 DIAGNOSIS — I110 Hypertensive heart disease with heart failure: Secondary | ICD-10-CM | POA: Insufficient documentation

## 2016-07-13 DIAGNOSIS — W19XXXA Unspecified fall, initial encounter: Secondary | ICD-10-CM

## 2016-07-13 DIAGNOSIS — M25462 Effusion, left knee: Secondary | ICD-10-CM | POA: Insufficient documentation

## 2016-07-13 DIAGNOSIS — Y999 Unspecified external cause status: Secondary | ICD-10-CM | POA: Insufficient documentation

## 2016-07-13 NOTE — Discharge Instructions (Signed)
You can apply ice to the swollen area of your knee for 20 minutes 2-3 times per day. Please follow-up with Dr. Cleophas Dunker if the swelling does not continue to improve. Continue to advance her knee exercises per PT. If he develops new or worsening symptoms, if the joint becomes red, hot, or swollen, or if he develops numbness, tingling, or weakness in the lower leg,please return to the emergency department for reevaluation.

## 2016-07-13 NOTE — ED Provider Notes (Signed)
[facility]-EMERGENCY DEPT Provider Note   CSN: [id number] Arrival date & time: 07/13/16  1118  By signing my name below, I, Modena Jansky, attest that this documentation has been prepared under the direction and in the presence of non-physician practitioner, Frederik Pear, PA-C. Electronically Signed: Modena Jansky, Scribe. 07/13/2016. 1:10 PM.  History   Chief Complaint Chief Complaint  Patient presents with  . Knee Pain   The history is provided by the patient. No language interpreter was used.   HPI Comments: [name] is a 51 y.o. male  who presents to the Emergency Department complaining of constant moderate left knee pain that started about 3 days ago. He states he slipped while stepping out of the shower and landed on his left knee. No LOC or head injury. He had a left knee arthroscopy on 04/13/16 and has already been cleared by ortho to return to work without restrictions. He used medication and brace PTA with minimal relief. His pain is exacerbated by movement. He reports associated left knee swelling (new since fall, currently improving). He is currently on physical therapy and has 2 weeks remaining. Denies any fever, chills, or other complaints at this time.  Past Medical History:  Diagnosis Date  . Chronic combined systolic and diastolic CHF (congestive heart failure) (HCC)    EF 25-30%  . Diastolic dysfunction   . Dyslipidemia   . Hyperlipidemia   . Hypertension   . Nonischemic dilated cardiomyopathy (HCC)    felt secondary to HTN with no ischemia on nuclear stress test 05/2012, EF 25-30%  . Shortness of breath     Patient Active Problem List   Diagnosis Date Noted  . Alcohol use disorder, mild, abuse 06/30/2016  . Morbid obesity (HCC) 06/30/2016  . 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 HTN (hypertension) 05/25/2012    Past Surgical History:  Procedure  Laterality Date  . KNEE ARTHROSCOPY Left 04/13/2016   Procedure: ARTHROSCOPY KNEE WITH DEBRIDEMENT;  Surgeon: Valeria Batman, MD;  Location: [facility];  Service: Orthopedics;  Laterality: Left;  . KNEE ARTHROSCOPY WITH MEDIAL MENISECTOMY Right 04/13/2016   Procedure: KNEE ARTHROSCOPY WITH MEDIAL MENISECTOMY;  Surgeon: Valeria Batman, MD;  Location: [facility];  Service: Orthopedics;  Laterality: Right;  . NASAL FRACTURE SURGERY         Home Medications    Prior to Admission medications   Medication Sig Start Date End Date Taking? Authorizing Provider  atorvastatin (LIPITOR) 40 MG tablet Take 1 tablet (40 mg total) by mouth daily at 6 PM. 06/10/16   Dorena Bodo, NP  hydrochlorothiazide (HYDRODIURIL) 25 MG tablet Take 1 tablet (25 mg total) by mouth daily. 06/10/16   Dorena Bodo, NP  metoprolol tartrate (LOPRESSOR) 50 MG tablet Take 1.5 tabs bid for BP. 06/10/16   Dorena Bodo, NP  Multiple Vitamin (MULTIVITAMIN WITH MINERALS) TABS tablet Take 1 tablet by mouth daily.    [provider]    Family History Family History  Problem Relation Age of Onset  . Hypertension Mother        Passed away when she is 29 secondary to hip surgery complications  . CAD Mother   . Heart attack Mother   . Hypertension Father   . Heart attack Maternal Grandmother     Social History Social History  Substance Use Topics  . Smoking status: Never Smoker  . Smokeless tobacco: Never Used  .  Alcohol use 14.4 oz/week    24 Cans of beer per week     Comment: 2 beers per day     Allergies   Patient has no known allergies.   Review of Systems Review of Systems  Constitutional: Negative for activity change, chills and fever.  Respiratory: Negative for shortness of breath.   Cardiovascular: Negative for chest pain.  Gastrointestinal: Negative for abdominal pain.  Musculoskeletal: Positive for arthralgias and myalgias (Left knee). Negative for  back pain and joint swelling (left knee).  Skin: Negative for rash.  Neurological: Negative for syncope.   Physical Exam Updated Vital Signs BP 128/83 (BP Location: Right Arm)   Pulse 69   Temp 98.1 F (36.7 C) (Oral)   Resp 20   Wt 245 lb (111.1 kg)   SpO2 96%   BMI 36.18 kg/m   Physical Exam  Constitutional: He appears well-developed.  HENT:  Head: Normocephalic.  Eyes: Conjunctivae are normal.  Neck: Neck supple.  Cardiovascular: Normal rate and regular rhythm.   No murmur heard. Pulmonary/Chest: Effort normal.  Abdominal: Soft. He exhibits no distension.  Musculoskeletal:  Full ROM of the bilateral knees, ankles, and hips. Mildly TTP over the left medial joint line. No left lateral joint line tenderness. Negative anterior and posterior drawer test. Negative Lachman and McMurray tests. Knee appears stable. Minimal crepitus of the patella with ROM. No tenderness of the patella tendon. Mild effusion of the superior knee. Per pt, has been improving over the last 4 days. NVI. +2 PT and DP pulses bilaterally. Able to bear weight on the BLE. Normal gait.   Neurological: He is alert.  Skin: Skin is warm and dry.  Psychiatric: His behavior is normal.  Nursing note and vitals reviewed.  ED Treatments / Results  DIAGNOSTIC STUDIES: Oxygen Saturation is 96% on RA, normal by my interpretation.    COORDINATION OF CARE: 1:14 PM- Pt advised of plan for treatment and pt agrees.  Labs (all labs ordered are listed, but only abnormal results are displayed) Labs Reviewed - No data to display  EKG  EKG Interpretation None       Radiology Dg Knee Complete 4 Views Left  Result Date: 07/13/2016 CLINICAL DATA:  51 year old male fell in shower 3 days ago. Left knee pain. EXAM: LEFT KNEE - COMPLETE 4+ VIEW COMPARISON:  03/27/2016 left knee radiographs. Left knee MRI 04/09/2016. FINDINGS: Continued suprapatellar joint effusion, moderate. Stable joint spaces and alignment. Patella remains  intact. No acute osseous abnormality identified. Bone mineralization is within normal limits. IMPRESSION: 1. Continued moderate left knee joint effusion. See also left knee MRI findings 04/09/2016. 2.  No acute osseous abnormality. Electronically Signed   By: Odessa Fleming M.D.   On: 07/13/2016 12:53    Procedures Procedures (including critical care time)  Medications Ordered in ED Medications - No data to display   Initial Impression / Assessment and Plan / ED Course  I have reviewed the triage vital signs and the nursing notes.  Pertinent labs & imaging results that were available during my care of the patient were reviewed by me and considered in my medical decision making (see chart for details).     Patient X-Ray negative for obvious fracture or dislocation. Pt advised to follow up with orthopedics. No instability noted on examination of the knee. The patient is ambulatory and able to bear weight without difficulty. Patient with conservative therapy recommended and discussed. Patient will be dc home with a note for work & is  agreeable with above plan.  Final Clinical Impressions(s) / ED Diagnoses   Final diagnoses:  Fall, initial encounter  Effusion of left knee    New Prescriptions Discharge Medication List as of 07/13/2016  1:28 PM     I personally performed the services described in this documentation, which was scribed in my presence. The recorded information has been reviewed and is accurate.    Frederik Pear A, PA-C 07/14/16 1949    Melene Plan, DO 07/15/16 [phone]

## 2016-07-13 NOTE — ED Triage Notes (Signed)
Pt stated that he felll 4 days ago, bruising l/knee.Pt was stepping out of shower. Reports recent surgery on knee. Pt is concerned about persistent pain. Denied LOC

## 2016-07-14 ENCOUNTER — Encounter (INDEPENDENT_AMBULATORY_CARE_PROVIDER_SITE_OTHER): Payer: Self-pay

## 2016-08-03 ENCOUNTER — Telehealth: Payer: Self-pay | Admitting: Family Medicine

## 2016-08-03 NOTE — Telephone Encounter (Signed)
Okay x 3 months. 

## 2016-08-03 NOTE — Telephone Encounter (Signed)
**  Remind patient they can make refill requests via MyChart**  Medication refill request (Name & Dosage): metoprolol tartrate (LOPRESSOR) 50 MG tablet hydrochlorothiazide (HYDRODIURIL) 25 MG tablet  Preferred pharmacy (Name & Address):  [facility] [address] ([address]), [place] - [address] [phone] (Phone) [phone] (Fax)

## 2016-08-03 NOTE — Telephone Encounter (Signed)
Please advise on refill.

## 2016-08-04 MED ORDER — METOPROLOL TARTRATE 50 MG PO TABS: ORAL_TABLET | ORAL | 3 refills | Status: DC

## 2016-08-04 MED ORDER — HYDROCHLOROTHIAZIDE 25 MG PO TABS
25.0000 mg | ORAL_TABLET | Freq: Every day | ORAL | 3 refills | Status: DC
Start: 2016-08-04 — End: 2017-06-05

## 2016-08-04 NOTE — Telephone Encounter (Signed)
RX sent to pharmacy  

## 2016-08-10 ENCOUNTER — Encounter (HOSPITAL_COMMUNITY): Payer: Self-pay | Admitting: Emergency Medicine

## 2016-08-10 ENCOUNTER — Emergency Department (HOSPITAL_COMMUNITY): Payer: Self-pay

## 2016-08-10 ENCOUNTER — Ambulatory Visit (HOSPITAL_COMMUNITY)
Admission: EM | Admit: 2016-08-10 | Discharge: 2016-08-10 | Disposition: A | Discharge status: Other Acute Inpatient Hospital | Payer: Self-pay

## 2016-08-10 ENCOUNTER — Emergency Department (HOSPITAL_COMMUNITY)
Admission: EM | Admit: 2016-08-10 | Discharge: 2016-08-10 | Disposition: A | Discharge status: Home / Self Care | Payer: Self-pay | Attending: Emergency Medicine | Admitting: Emergency Medicine

## 2016-08-10 DIAGNOSIS — Y93H2 Activity, gardening and landscaping: Secondary | ICD-10-CM | POA: Insufficient documentation

## 2016-08-10 DIAGNOSIS — I10 Essential (primary) hypertension: Secondary | ICD-10-CM | POA: Insufficient documentation

## 2016-08-10 DIAGNOSIS — X500XXA Overexertion from strenuous movement or load, initial encounter: Secondary | ICD-10-CM | POA: Insufficient documentation

## 2016-08-10 DIAGNOSIS — Y999 Unspecified external cause status: Secondary | ICD-10-CM | POA: Insufficient documentation

## 2016-08-10 DIAGNOSIS — Z79899 Other long term (current) drug therapy: Secondary | ICD-10-CM | POA: Insufficient documentation

## 2016-08-10 DIAGNOSIS — Y92007 Garden or yard of unspecified non-institutional (private) residence as the place of occurrence of the external cause: Secondary | ICD-10-CM | POA: Insufficient documentation

## 2016-08-10 DIAGNOSIS — I509 Heart failure, unspecified: Secondary | ICD-10-CM | POA: Insufficient documentation

## 2016-08-10 DIAGNOSIS — M25461 Effusion, right knee: Secondary | ICD-10-CM | POA: Insufficient documentation

## 2016-08-10 DIAGNOSIS — M25462 Effusion, left knee: Secondary | ICD-10-CM

## 2016-08-10 DIAGNOSIS — M25562 Pain in left knee: Secondary | ICD-10-CM

## 2016-08-10 NOTE — Discharge Instructions (Signed)
Please take Ibuprofen (Advil, motrin) and Tylenol (acetaminophen) to relieve your pain.  You may take up to 600 MG (3 pills) of normal strength ibuprofen every 8 hours as needed.  In between doses of ibuprofen you make take tylenol, up to 1,000 mg (two extra strength pills).  Do not take more than 3,000 mg tylenol in a 24 hour period.  Please check all medication labels as many medications such as pain and cold medications may contain tylenol.  Do not drink alcohol while taking these medications.  Do not take other NSAID'S while taking ibuprofen (such as aleve or naproxen).  Please take ibuprofen with food to decrease stomach upset.  While in the ED your blood pressure was high.  Please follow up with your primary care doctor or the wellness clinic for repeat evaluation as you may need medication.  High blood pressure can cause long term, potentially serious, damage if left untreated.   

## 2016-08-10 NOTE — ED Provider Notes (Signed)
[facility] Provider Note   CSN: [id number] Arrival date & time: 08/10/16  1338 By signing my name below, I, Levon Hedger, attest that this documentation has been prepared under the direction and in the presence of non-physician practitioner, Lyndel Safe, PA-C. Electronically Signed: Levon Hedger, Scribe. 08/10/2016. 4:15 PM.   History   Chief Complaint Chief Complaint  Patient presents with  . Knee Pain    HPI  [name] is a 51 y.o. male who presents to the Emergency Department complaining of sudden onset, gradually improving left knee pain s/p twisting knee three days ago. Pt states he stepped in a hole in his yard three days ago and twisting his knee. He reports associated swelling to the area as well. Pt had knee arthroscopy with menisectomy in 03/18 performed by Dr. Cleophas Dunker. Pt called his orthopedist and was told to rest his knee which he has done with relief of pain. He denies any ankle pain or any other injuries. Pt has no other acute complaints or associated symptoms at this time.  Patient states that he is primarily here as his work is requiring him to have a note.  The history is provided by the patient. No language interpreter was used.    Past Medical History:  Diagnosis Date  . Chronic combined systolic and diastolic CHF (congestive heart failure) (HCC)    EF 25-30%  . Diastolic dysfunction   . Dyslipidemia   . Hyperlipidemia   . Hypertension   . Nonischemic dilated cardiomyopathy (HCC)    felt secondary to HTN with no ischemia on nuclear stress test 05/2012, EF 25-30%  . Shortness of breath     Patient Active Problem List   Diagnosis Date Noted  . Alcohol use disorder, mild, abuse 06/30/2016  . Morbid obesity (HCC) 06/30/2016  . 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 HTN (hypertension) 05/25/2012    Past Surgical History:    Procedure Laterality Date  . KNEE ARTHROSCOPY Left 04/13/2016   Procedure: ARTHROSCOPY KNEE WITH DEBRIDEMENT;  Surgeon: Valeria Batman, MD;  Location: [facility];  Service: Orthopedics;  Laterality: Left;  . KNEE ARTHROSCOPY WITH MEDIAL MENISECTOMY Right 04/13/2016   Procedure: KNEE ARTHROSCOPY WITH MEDIAL MENISECTOMY;  Surgeon: Valeria Batman, MD;  Location: [facility];  Service: Orthopedics;  Laterality: Right;  . NASAL FRACTURE SURGERY         Home Medications    Prior to Admission medications   Medication Sig Start Date End Date Taking? Authorizing Provider  atorvastatin (LIPITOR) 40 MG tablet Take 1 tablet (40 mg total) by mouth daily at 6 PM. 06/10/16   Dorena Bodo, NP  hydrochlorothiazide (HYDRODIURIL) 25 MG tablet Take 1 tablet (25 mg total) by mouth daily. 08/04/16   Helane Rima, DO  metoprolol tartrate (LOPRESSOR) 50 MG tablet Take 1.5 tabs bid for BP. 08/04/16   Helane Rima, DO  Multiple Vitamin (MULTIVITAMIN WITH MINERALS) TABS tablet Take 1 tablet by mouth daily.    [provider]    Family History Family History  Problem Relation Age of Onset  . Hypertension Mother        Passed away when she is 31 secondary to hip surgery complications  . CAD Mother   . Heart attack Mother   . Hypertension Father   . Heart attack Maternal Grandmother     Social History Social History  Substance Use Topics  .  Smoking status: Never Smoker  . Smokeless tobacco: Never Used  . Alcohol use 14.4 oz/week    24 Cans of beer per week     Comment: 2 beers per day     Allergies   Patient has no known allergies.   Review of Systems Review of Systems  Musculoskeletal: Positive for arthralgias, joint swelling and myalgias.  Skin: Negative for wound.     Physical Exam Updated Vital Signs BP (!) 160/92 (BP Location: Left Arm)   Pulse 77   Temp 98.5 F (36.9 C) (Oral)   Resp 18   Ht 5\' 10"  (1.778 m)   Wt 112 kg (247 lb)    SpO2 100%   BMI 35.44 kg/m   Physical Exam  Constitutional: He appears well-developed and well-nourished.  HENT:  Head: Normocephalic and atraumatic.  Pulmonary/Chest: No respiratory distress.  Musculoskeletal:  Left knee is diffusely tender to palpation, no obvious edema. No tenderness to that of the fibula.  Ligaments are grossly intact to anterior/posterior drawer test, and valgus/varus stress. Left leg compartments are palpated, soft, nontender.  Left foot is warm, well perfused. Sensation is intact to left lower extremity.  Neurological: He is alert. No sensory deficit. He exhibits normal muscle tone.  Skin: Skin is warm and dry. He is not diaphoretic.  No obvious wounds to skin over left knee.  Psychiatric: He has a normal mood and affect. His behavior is normal.  Nursing note and vitals reviewed.  ED Treatments / Results  DIAGNOSTIC STUDIES:  Oxygen Saturation is 98% on RA, normal by my interpretation.    COORDINATION OF CARE:  4:17 PM Pt to f/u with his orthopedist. Discussed treatment plan with pt at bedside and pt agreed to plan.   Labs (all labs ordered are listed, but only abnormal results are displayed) Labs Reviewed - No data to display  EKG  EKG Interpretation None       Radiology Dg Knee Complete 4 Views Left  Result Date: 08/10/2016 CLINICAL DATA:  Twisted leg while mowing.  Pain. EXAM: LEFT KNEE - COMPLETE 4+ VIEW COMPARISON:  07/13/2016 FINDINGS: Large knee joint effusion.  No evidence of fracture or dislocation. IMPRESSION: Large knee joint effusion, similar to the previous exam. Electronically Signed   By: Paulina Fusi M.D.   On: 08/10/2016 15:08    Procedures Procedures (including critical care time)  Medications Ordered in ED Medications - No data to display   Initial Impression / Assessment and Plan / ED Course  I have reviewed the triage vital signs and the nursing notes.  Pertinent labs & imaging results that were available during my  care of the patient were reviewed by me and considered in my medical decision making (see chart for details).    [name] presents with left knee pain after twisting his left knee on Monday. X-rays continue to show large effusion, no obvious evidence of acute abnormality.  Patient was offered knee immobilizer, however states that he has multiple braces from his previous surgeries with this knee. Patient offered crutches but declined. Patient instructed on over-the-counter pain relief and supportive measures. Patient given work note, discharge precautions. Patient given the option to ask questions, all of which were answered to the best of my abilities.   At this time there does not appear to be any evidence of an acute emergency medical condition and the patient appears stable for discharge with appropriate outpatient follow up.Diagnosis was discussed with patient who verbalizes understanding and is agreeable  to discharge.  Final Clinical Impressions(s) / ED Diagnoses   Final diagnoses:  Acute pain of left knee  Effusion of left knee    New Prescriptions Discharge Medication List as of 08/10/2016  4:21 PM     I personally performed the services described in this documentation, which was scribed in my presence. The recorded information has been reviewed and is accurate.      Cristina Gong, PA-C 08/10/16 2030    Mesner, Barbara Cower, MD 08/11/16 1023

## 2016-08-10 NOTE — ED Triage Notes (Signed)
Patient reports that on Monday he was mowing yard and stepped in hole cause left knee to twist. Patient reports pain seen and little swelling compared to right knee.  Patient reports he had surgery on that knee earlier in year and has been wearing brace on knee since Monday.

## 2017-02-06 ENCOUNTER — Telehealth (INDEPENDENT_AMBULATORY_CARE_PROVIDER_SITE_OTHER): Payer: Self-pay | Admitting: Orthopaedic Surgery

## 2017-02-06 NOTE — Telephone Encounter (Signed)
Please advise 

## 2017-02-06 NOTE — Telephone Encounter (Signed)
I am not aware that the drug could still be detectable 10 months post use

## 2017-02-06 NOTE — Telephone Encounter (Signed)
Patient's wife [name]) called requesting information on the medication that Dr. Cleophas Dunker might have prescribed during her husband's knee surgery on 04/13/16 that could show up in his hair during a drug test two weeks ago.  He remembers taking Hydrocodone.  CB# [phone]

## 2017-02-08 NOTE — Telephone Encounter (Signed)
Gave message to wife per DR. Whitfield. Found this medication and told wife this was on file, not from DR. Whitfield    Diphenhydramine-PE-APAP [facility]) 12.5-5-325 MG/15ML Anise Salvo [[id number]] DISCONTINUED  Order Details  Dose: 1 Dose Route: Oral Frequency: Every 6 hours PRN for flu symtpoms  Dispense Quantity: -- Refills: -- Fills remaining: --        Sig: Take 1 Dose by mouth every 6 (six) hours as needed (flu symtpoms).       Discontinue Date:  04/11/2016 14:25 Discontinue User:  Henderson Cloud, RN Discontinue Reason:  Error  Written Date: -- Expiration Date: -- Ordering Date: 01/09/15   Start Date: -- End Date: 04/11/16         Ordering Provider:  -- DEA #:  -- NPI:  --   Authorizing Provider:  [provider] DEA #:  -- NPI:  [id number]   Ordering User:  Geri Seminole, CPhT          Medication Notes     Geri Seminole, CPhT -- 01/09/15 1:30 PM     >> Joella Prince A  Sat Jan 09, 2015 1:30 PM     Takes both liquid and powder theraflu       Pharmacy Comments:  --

## 2017-02-12 ENCOUNTER — Telehealth (INDEPENDENT_AMBULATORY_CARE_PROVIDER_SITE_OTHER): Payer: Self-pay | Admitting: Orthopaedic Surgery

## 2017-02-12 NOTE — Telephone Encounter (Signed)
Patient left a voicemail returning Janet's call.  CB# [phone]

## 2017-02-12 NOTE — Telephone Encounter (Signed)
Spoke with wife on first call. No VM on my machine or in notes

## 2017-03-26 IMAGING — CR DG KNEE COMPLETE 4+V*L*
4 series · 4 of 4 positions shown · non-contrast
Comparison: None.

CLINICAL DATA: 50-year-old male with increased left knee swelling
for 1 week. Symptoms following increased exercise.

EXAM:
LEFT KNEE - COMPLETE 4+ VIEW

[t knee ap left]
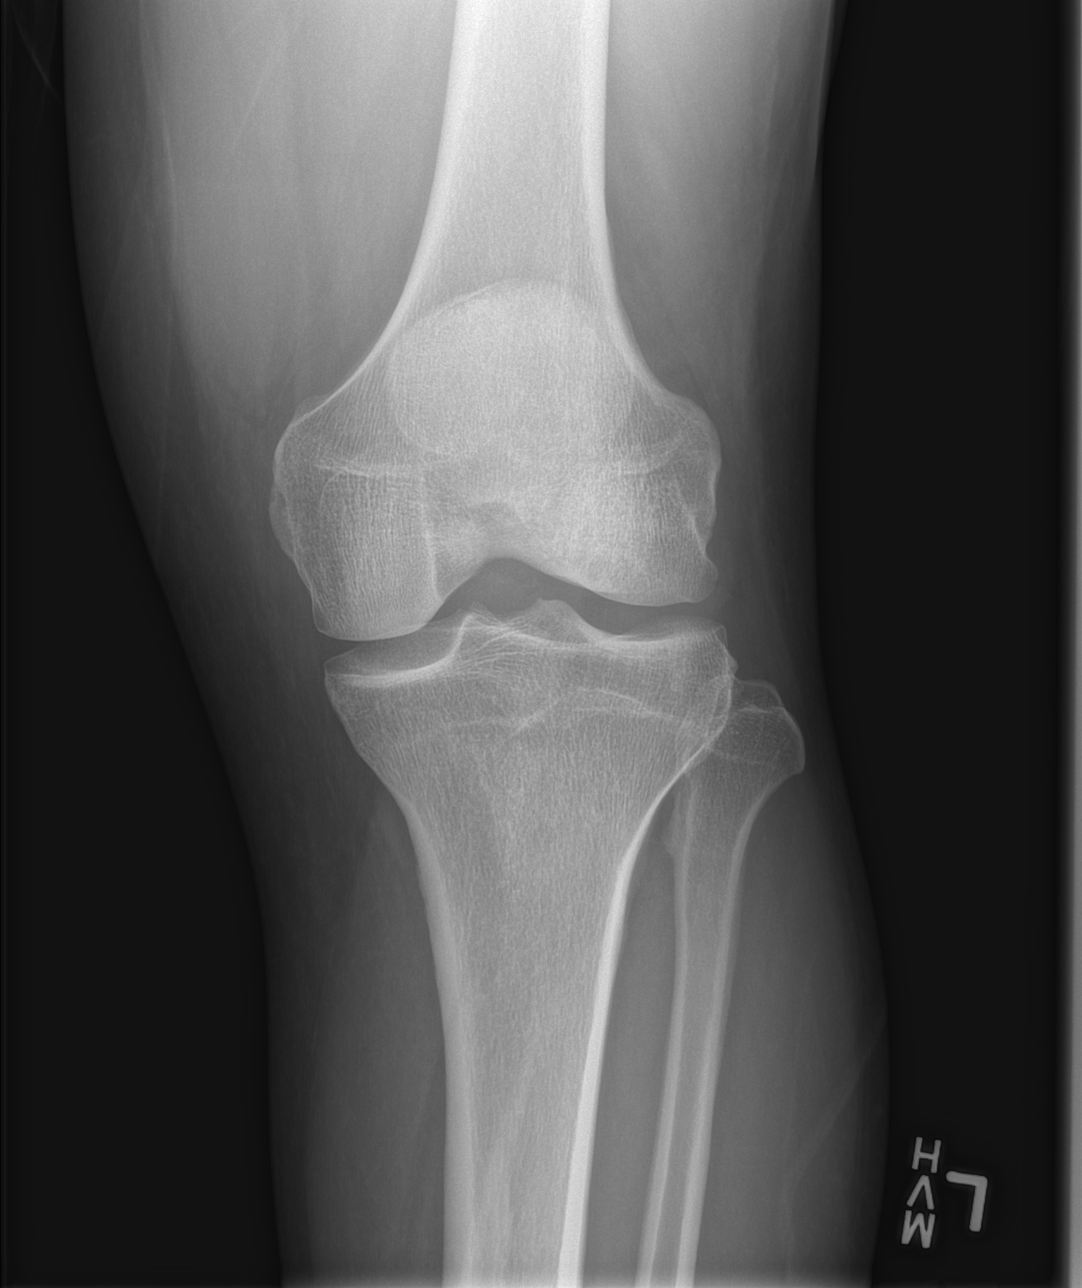

[t knee obl left (1 of 2)]
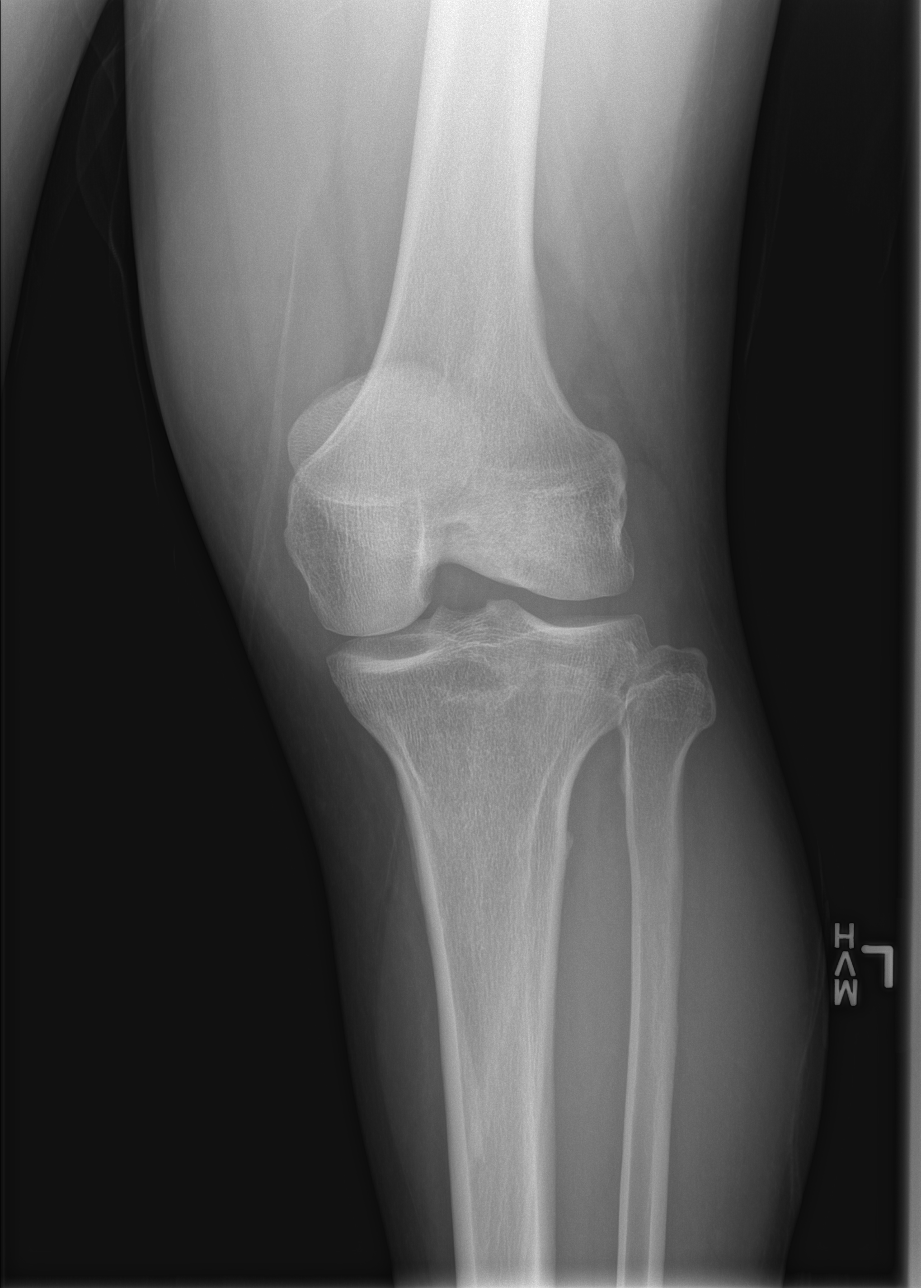

[t knee obl left (2 of 2)]
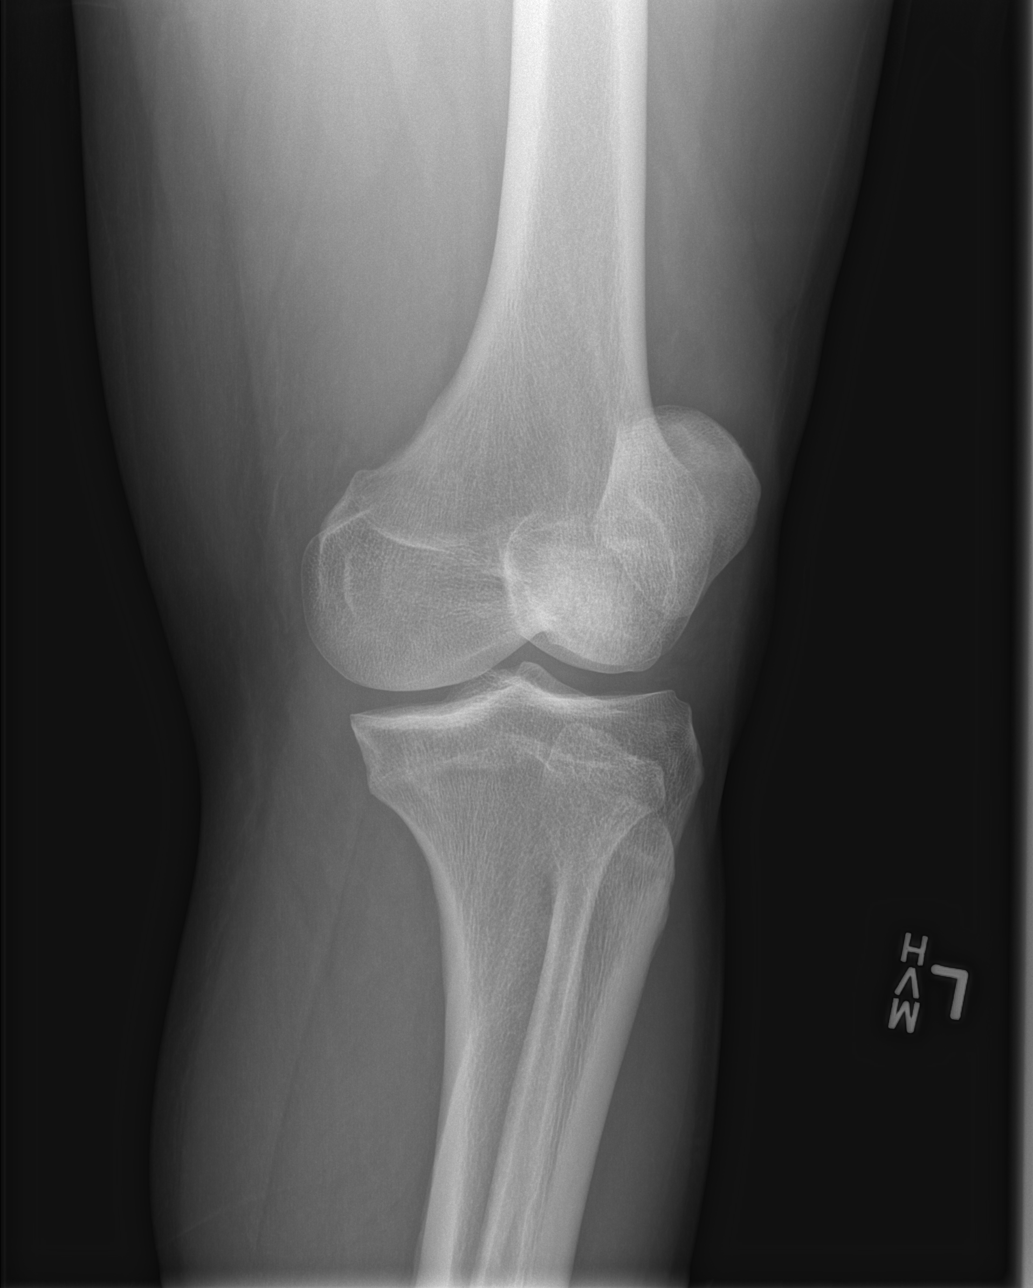

[t knee lat left]
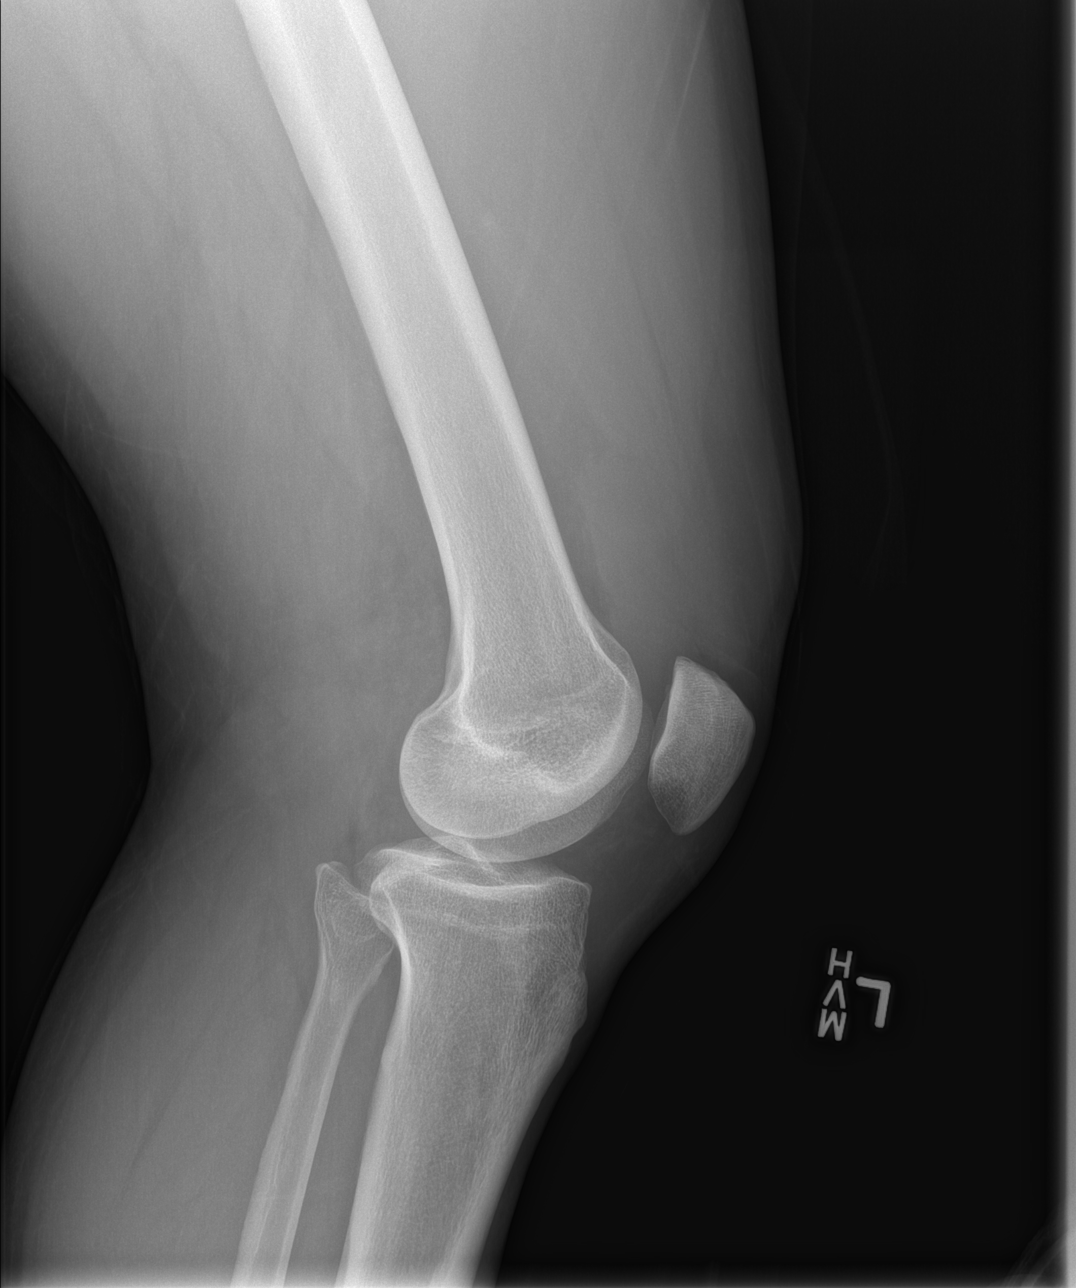

[4 of 4 positions shown; findings below may reference images not displayed]

FINDINGS: Moderate to severe suprapatellar joint effusion. Patella intact.
Joint spaces and alignment are within normal limits. Bone
mineralization is within normal limits. No acute osseous abnormality
identified.
IMPRESSION: Moderate to severe suspected joint effusion but no osseous
abnormality identified in joint spaces appear preserved.

## 2017-03-29 ENCOUNTER — Other Ambulatory Visit: Payer: Self-pay | Admitting: Family Medicine

## 2017-04-01 DIAGNOSIS — E785 Hyperlipidemia, unspecified: Secondary | ICD-10-CM | POA: Insufficient documentation

## 2017-04-01 NOTE — Assessment & Plan Note (Deleted)
The patient is asked to make an attempt to improve diet and exercise patterns to aid in medical management of this problem.  

## 2017-04-01 NOTE — Assessment & Plan Note (Deleted)
Is the patient taking medications without problems? []   YES  []   NO Does the patient complain of muscle aches?   []   YES  []    NO Trying to exercise on a regular basis? []   YES  []   NO Diet Compliance:  Cardiovascular ROS: no chest pain or dyspnea on exertion. He is due for a FLP.

## 2017-04-01 NOTE — Progress Notes (Deleted)
[name] is a 52 y.o. male is here for follow up.  History of Present Illness:   {CMA SCRIBE ATTESTATION}  HPI: See Assessment and Plan section for Problem Based Charting of issues discussed today.   ROS Per HPI, otherwise negative.  Health Maintenance Due  Topic Date Due  . Hepatitis C Screening  [birth year]  . HIV Screening  09/20/1978  . TETANUS/TDAP  09/20/1982  . COLONOSCOPY  09/19/2013  . INFLUENZA VACCINE  08/23/2016   No flowsheet data found.   PMHx, SurgHx, SocialHx, FamHx, Medications, and Allergies were reviewed in the Visit Navigator and updated as appropriate.   Patient Active Problem List   Diagnosis Date Noted  . Alcohol use disorder, mild, abuse 06/30/2016  . Morbid obesity (HCC) 06/30/2016  . 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 HTN (hypertension) 05/25/2012   Social History   Tobacco Use  . Smoking status: Never Smoker  . Smokeless tobacco: Never Used  Substance Use Topics  . Alcohol use: Yes    Alcohol/week: 14.4 oz    Types: 24 Cans of beer per week    Comment: 2 beers per day  . Drug use: No   Current Medications and Allergies:   Current Outpatient Medications:  .  atorvastatin (LIPITOR) 40 MG tablet, Take 1 tablet (40 mg total) by mouth daily at 6 PM., Disp: 30 tablet, Rfl: 1 .  hydrochlorothiazide (HYDRODIURIL) 25 MG tablet, Take 1 tablet (25 mg total) by mouth daily., Disp: 30 tablet, Rfl: 3 .  metoprolol tartrate (LOPRESSOR) 50 MG tablet, TAKE 1 & 1/2 (ONE & ONE-HALF) TABLETS BY MOUTH TWICE DAILY FOR BLOOD PRESSURE, Disp: 30 tablet, Rfl: 0 .  Multiple Vitamin (MULTIVITAMIN WITH MINERALS) TABS tablet, Take 1 tablet by mouth daily., Disp: , Rfl:   No Known Allergies   Review of Systems   Pertinent items are noted in the HPI. Otherwise, ROS is negative.  Vitals:  There were no vitals filed for this visit.   There is no  height or weight on file to calculate BMI.  Physical Exam:   Physical Exam  Constitutional: He is oriented to person, place, and time. He appears well-developed and well-nourished. No distress.  HENT:  Head: Normocephalic and atraumatic.  Right Ear: External ear normal.  Left Ear: External ear normal.  Nose: Nose normal.  Mouth/Throat: Oropharynx is clear and moist.  Eyes: Conjunctivae and EOM are normal. Pupils are equal, round, and reactive to light.  Neck: Normal range of motion. Neck supple.  Cardiovascular: Normal rate, regular rhythm, normal heart sounds and intact distal pulses.  Pulmonary/Chest: Effort normal and breath sounds normal.  Abdominal: Soft. Bowel sounds are normal.  Musculoskeletal: Normal range of motion.  Neurological: He is alert and oriented to person, place, and time.  Skin: Skin is warm and dry.  Psychiatric: He has a normal mood and affect. His behavior is normal. Judgment and thought content normal.  Nursing note and vitals reviewed.    Assessment and Plan:    Patient Active Problem List   Diagnosis Date Noted  . Hyperlipidemia, on Lipitor 04/01/2017  . Alcohol use disorder, mild, abuse 06/30/2016  . Morbid obesity (HCC) 06/30/2016  . 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 HTN (hypertension) 05/25/2012    1. Mixed hyperlipidemia   2. Morbid obesity (HCC)   3. Essential  hypertension     Hyperlipidemia, on Lipitor Is the patient taking medications without problems? []   YES  []   NO Does the patient complain of muscle aches?   []   YES  []    NO Trying to exercise on a regular basis? []   YES  []   NO Diet Compliance:  Cardiovascular ROS: no chest pain or dyspnea on exertion. He is due for a FLP.   Morbid obesity (HCC) The patient is asked to make an attempt to improve diet and exercise patterns to aid in medical management of this problem.     HTN (hypertension) Review: taking medications as instructed, no medication side effects noted, no TIAs, no chest pain on exertion, no dyspnea on exertion, no swelling of ankles. Smoker: No.    No orders of the defined types were placed in this encounter.   No orders of the defined types were placed in this encounter.   . Reviewed expectations re: course of current medical issues. . Discussed self-management of symptoms. . Outlined signs and symptoms indicating need for more acute intervention. . Patient verbalized understanding and all questions were answered. Marland Kitchen Health Maintenance issues including appropriate healthy diet, exercise, and smoking avoidance were discussed with patient. . See orders for this visit as documented in the electronic medical record. . Patient received an After Visit Summary.  CMA served as Neurosurgeon during this visit. History, Physical, and Plan performed by medical provider. The above documentation has been reviewed and is accurate and complete. Helane Rima, D.O.  Helane Rima, DO [place], [facility] 04/01/2017

## 2017-04-01 NOTE — Assessment & Plan Note (Deleted)
Review: taking medications as instructed, no medication side effects noted, no TIAs, no chest pain on exertion, no dyspnea on exertion, no swelling of ankles. Smoker: No.

## 2017-04-02 ENCOUNTER — Ambulatory Visit: Payer: Self-pay | Admitting: Family Medicine

## 2017-04-02 DIAGNOSIS — Z0289 Encounter for other administrative examinations: Secondary | ICD-10-CM

## 2017-04-03 ENCOUNTER — Encounter: Payer: Self-pay | Admitting: Family Medicine

## 2017-04-17 ENCOUNTER — Encounter (HOSPITAL_COMMUNITY): Payer: Self-pay | Admitting: Family Medicine

## 2017-04-17 ENCOUNTER — Ambulatory Visit (HOSPITAL_COMMUNITY)
Admission: EM | Admit: 2017-04-17 | Discharge: 2017-04-17 | Disposition: A | Discharge status: Home / Self Care | Payer: Self-pay | Attending: Family Medicine | Admitting: Family Medicine

## 2017-04-17 DIAGNOSIS — I10 Essential (primary) hypertension: Secondary | ICD-10-CM

## 2017-04-17 DIAGNOSIS — Z760 Encounter for issue of repeat prescription: Secondary | ICD-10-CM

## 2017-04-17 MED ORDER — METOPROLOL TARTRATE 50 MG PO TABS: ORAL_TABLET | ORAL | 0 refills | Status: DC

## 2017-04-17 NOTE — ED Provider Notes (Signed)
[facility]    CSN: [id number] Arrival date & time: 04/17/17  1133     History   Chief Complaint Chief Complaint  Patient presents with  . Medication Refill    HPI [name] is a 52 y.o. male.   52 year old male with history of CHF, HLD, HTN comes in for medication refill of metoprolol.  Patient states has not been able to afford PCP visit since losing insurance.  PCP provided him with a refill recently, however it was only a 10-day supply.  He ran out of medication 2 days ago.  States he has still been taking atorvastatin and HCTZ as directed and has 30-day supplies of both.  States usually with metoprolol, HCTZ, his blood pressure runs about 130s-140s.  He has been on both medications for many years without problems and without changes in dosage.  Denies chest pain, shortness of breath, wheezing.  Denies palpitation, leg swelling, orthopnea.     Past Medical History:  Diagnosis Date  . Chronic combined systolic and diastolic CHF (congestive heart failure) (HCC)    EF 25-30%  . Diastolic dysfunction   . Dyslipidemia   . Hyperlipidemia   . Hypertension   . Nonischemic dilated cardiomyopathy (HCC)    felt secondary to HTN with no ischemia on nuclear stress test 05/2012, EF 25-30%  . Shortness of breath     Patient Active Problem List   Diagnosis Date Noted  . Hyperlipidemia, on Lipitor 04/01/2017  . Alcohol use disorder, mild, abuse 06/30/2016  . Morbid obesity (HCC) 06/30/2016  . 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 HTN (hypertension) 05/25/2012    Past Surgical History:  Procedure Laterality Date  . KNEE ARTHROSCOPY Left 04/13/2016   Procedure: ARTHROSCOPY KNEE WITH DEBRIDEMENT;  Surgeon: Valeria Batman, MD;  Location: [facility];  Service: Orthopedics;  Laterality: Left;  . KNEE ARTHROSCOPY WITH MEDIAL MENISECTOMY Right  04/13/2016   Procedure: KNEE ARTHROSCOPY WITH MEDIAL MENISECTOMY;  Surgeon: Valeria Batman, MD;  Location: [facility];  Service: Orthopedics;  Laterality: Right;  . NASAL FRACTURE SURGERY         Home Medications    Prior to Admission medications   Medication Sig Start Date End Date Taking? Authorizing Provider  atorvastatin (LIPITOR) 40 MG tablet Take 1 tablet (40 mg total) by mouth daily at 6 PM. 06/10/16   Dorena Bodo, NP  hydrochlorothiazide (HYDRODIURIL) 25 MG tablet Take 1 tablet (25 mg total) by mouth daily. 08/04/16   Helane Rima, DO  metoprolol tartrate (LOPRESSOR) 50 MG tablet TAKE 1 & 1/2 (ONE & ONE-HALF) TABLETS BY MOUTH TWICE DAILY FOR BLOOD PRESSURE 04/17/17   Cathie Hoops, Arel Tippen V, PA-C  Multiple Vitamin (MULTIVITAMIN WITH MINERALS) TABS tablet Take 1 tablet by mouth daily.    [provider]    Family History Family History  Problem Relation Age of Onset  . Hypertension Mother        Passed away when she is 34 secondary to hip surgery complications  . CAD Mother   . Heart attack Mother   . Hypertension Father   . Heart attack Maternal Grandmother     Social History Social History   Tobacco Use  . Smoking status: Never Smoker  . Smokeless tobacco: Never Used  Substance Use Topics  . Alcohol use: Yes    Alcohol/week: 14.4 oz    Types: 24 Cans of beer  per week    Comment: 2 beers per day  . Drug use: No     Allergies   Patient has no known allergies.   Review of Systems Review of Systems  Reason unable to perform ROS: See HPI as above.     Physical Exam Triage Vital Signs ED Triage Vitals  Enc Vitals Group     BP 04/17/17 1231 (!) 168/107     Pulse Rate 04/17/17 1231 81     Resp 04/17/17 1231 16     Temp 04/17/17 1231 98.2 F (36.8 C)     Temp Source 04/17/17 1231 Oral     SpO2 04/17/17 1231 96 %     Weight --      Height --      Head Circumference --      Peak Flow --      Pain Score 04/17/17 1235 0     Pain Loc  --      Pain Edu? --      Excl. in GC? --    No data found.  Updated Vital Signs BP (!) 168/107 (BP Location: Left Arm)   Pulse 81   Temp 98.2 F (36.8 C) (Oral)   Resp 16   SpO2 96%   Physical Exam  Constitutional: He is oriented to person, place, and time. He appears well-developed and well-nourished. No distress.  HENT:  Head: Normocephalic and atraumatic.  Eyes: Pupils are equal, round, and reactive to light. Conjunctivae are normal.  Cardiovascular: Normal rate, regular rhythm and normal heart sounds. Exam reveals no gallop and no friction rub.  No murmur heard. Pulmonary/Chest: Effort normal and breath sounds normal. No stridor. No respiratory distress. He has no decreased breath sounds. He has no wheezes. He has no rhonchi. He has no rales.  Neurological: He is alert and oriented to person, place, and time.     UC Treatments / Results  Labs (all labs ordered are listed, but only abnormal results are displayed) Labs Reviewed - No data to display  EKG None Radiology No results found.  Procedures Procedures (including critical care time)  Medications Ordered in UC Medications - No data to display   Initial Impression / Assessment and Plan / UC Course  I have reviewed the triage vital signs and the nursing notes.  Pertinent labs & imaging results that were available during my care of the patient were reviewed by me and considered in my medical decision making (see chart for details).    Metoprolol refilled. Patient to keep BP log. [place] and wellness information provided. PCP assistance. Follow up with PCP for further evaluation and management needed for HTN. Return precautions given. Patient expresses understanding and agrees to plan.   Final Clinical Impressions(s) / UC Diagnoses   Final diagnoses:  Medication refill    ED Discharge Orders        Ordered    metoprolol tartrate (LOPRESSOR) 50 MG tablet    Note to Pharmacy:  Please consider 90 day  supplies to promote better adherence   04/17/17 1246        Belinda Fisher, PA-C 04/17/17 1253

## 2017-04-17 NOTE — ED Triage Notes (Signed)
Pt here for med refill on metoprolol. He reports that he takes 1 1/2 pills in the morning and at night. The last prescription was only enough for 10 days. He hasnt had the meds in 2 days. Denies any chest pain, SOB, dizziness.

## 2017-04-17 NOTE — Discharge Instructions (Signed)
Metoprolol refilled. Maintain low salt diet to better control blood pressure. Daily blood pressure log to help your PCP better manage your blood pressure. I have attached [place] and wellness information, please call and make appointment to establish PCP care.

## 2017-06-05 ENCOUNTER — Encounter (HOSPITAL_COMMUNITY): Payer: Self-pay | Admitting: Family Medicine

## 2017-06-05 ENCOUNTER — Ambulatory Visit (HOSPITAL_COMMUNITY)
Admission: EM | Admit: 2017-06-05 | Discharge: 2017-06-05 | Disposition: A | Discharge status: Home / Self Care | Payer: Self-pay | Attending: Family Medicine | Admitting: Family Medicine

## 2017-06-05 DIAGNOSIS — Z760 Encounter for issue of repeat prescription: Secondary | ICD-10-CM

## 2017-06-05 DIAGNOSIS — I10 Essential (primary) hypertension: Secondary | ICD-10-CM

## 2017-06-05 MED ORDER — METOPROLOL TARTRATE 50 MG PO TABS: ORAL_TABLET | ORAL | 1 refills | Status: DC

## 2017-06-05 NOTE — ED Triage Notes (Addendum)
Pt here for refill on metoprolol sts that he took this am. He has been doing one dose a day.

## 2017-06-05 NOTE — ED Provider Notes (Signed)
[facility]    CSN: [id number] Arrival date & time: 06/05/17  1900     History   Chief Complaint Chief Complaint  Patient presents with  . Medication Refill    HPI [name] is a 52 y.o. male.   HPI  She is here for refill of metoprolol is not taking hydrochlorothiazide for unclear reasons.  He states he does not like the way it makes him feel.  He still has not found a PCP.  I am going to refer him for PCP assistance.  He is taking his Lipitor.  He states he has enough of this medicine to last until his visit.  He is taking a baby aspirin a day. No headache or dizzy spells.  No chest pain or shortness of breath.  No pedal edema.  Past Medical History:  Diagnosis Date  . Chronic combined systolic and diastolic CHF (congestive heart failure) (HCC)    EF 25-30%  . Diastolic dysfunction   . Dyslipidemia   . Hyperlipidemia   . Hypertension   . Nonischemic dilated cardiomyopathy (HCC)    felt secondary to HTN with no ischemia on nuclear stress test 05/2012, EF 25-30%  . Shortness of breath     Patient Active Problem List   Diagnosis Date Noted  . Hyperlipidemia, on Lipitor 04/01/2017  . Alcohol use disorder, mild, abuse 06/30/2016  . Morbid obesity (HCC) 06/30/2016  . 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 HTN (hypertension) 05/25/2012    Past Surgical History:  Procedure Laterality Date  . KNEE ARTHROSCOPY Left 04/13/2016   Procedure: ARTHROSCOPY KNEE WITH DEBRIDEMENT;  Surgeon: Valeria Batman, MD;  Location: [facility];  Service: Orthopedics;  Laterality: Left;  . KNEE ARTHROSCOPY WITH MEDIAL MENISECTOMY Right 04/13/2016   Procedure: KNEE ARTHROSCOPY WITH MEDIAL MENISECTOMY;  Surgeon: Valeria Batman, MD;  Location: [facility];  Service: Orthopedics;  Laterality: Right;  . NASAL FRACTURE SURGERY         Home  Medications    Prior to Admission medications   Medication Sig Start Date End Date Taking? Authorizing Provider  atorvastatin (LIPITOR) 40 MG tablet Take 1 tablet (40 mg total) by mouth daily at 6 PM. 06/10/16   Dorena Bodo, NP  metoprolol tartrate (LOPRESSOR) 50 MG tablet TAKE 1 & 1/2 (ONE & ONE-HALF) TABLETS BY MOUTH TWICE DAILY FOR BLOOD PRESSURE 06/05/17   Eustace Moore, MD  Multiple Vitamin (MULTIVITAMIN WITH MINERALS) TABS tablet Take 1 tablet by mouth daily.    [provider]    Family History Family History  Problem Relation Age of Onset  . Hypertension Mother        Passed away when she is 46 secondary to hip surgery complications  . CAD Mother   . Heart attack Mother   . Hypertension Father   . Heart attack Maternal Grandmother     Social History Social History   Tobacco Use  . Smoking status: Never Smoker  . Smokeless tobacco: Never Used  Substance Use Topics  . Alcohol use: Yes    Alcohol/week: 14.4 oz    Types: 24 Cans of beer per week    Comment: 2 beers per day  . Drug use: No     Allergies   Patient has no known allergies.   Review of Systems Review of Systems  Constitutional: Negative for activity change and appetite change.  HENT: Negative for congestion and dental problem.   Eyes: Negative for pain and visual disturbance.  Respiratory: Negative for cough and shortness of breath.   Cardiovascular: Negative for chest pain and palpitations.  Gastrointestinal: Negative for abdominal distention and abdominal pain.  Genitourinary: Negative for dysuria and hematuria.  Musculoskeletal: Negative for arthralgias and back pain.  Skin: Negative for color change and rash.  Neurological: Negative for dizziness and headaches.  All other systems reviewed and are negative.   Physical Exam Triage Vital Signs ED Triage Vitals  Enc Vitals Group     BP 06/05/17 1944 (!) 173/83     Pulse Rate 06/05/17 1944 82     Resp 06/05/17 1944 18      Temp 06/05/17 1944 98.1 F (36.7 C)     Temp src --      SpO2 06/05/17 1944 98 %     Weight --      Height --      Head Circumference --      Peak Flow --      Pain Score 06/05/17 1943 0     Pain Loc --      Pain Edu? --      Excl. in GC? --    No data found.  Updated Vital Signs BP (!) 173/83   Pulse 82   Temp 98.1 F (36.7 C)   Resp 18   SpO2 98%        Physical Exam  Constitutional: He is oriented to person, place, and time. He appears well-developed and well-nourished. No distress.  HENT:  Head: Normocephalic and atraumatic.  Mouth/Throat: Oropharynx is clear and moist.  Eyes: Pupils are equal, round, and reactive to light.  Neck: Neck supple.  Cardiovascular: Normal rate, regular rhythm and normal heart sounds.  Pulmonary/Chest: Effort normal and breath sounds normal.  Abdominal: He exhibits no distension.  Musculoskeletal: He exhibits no edema.  Neurological: He is alert and oriented to person, place, and time.  Skin: Skin is warm and dry.     UC Treatments / Results  Labs (all labs ordered are listed, but only abnormal results are displayed) Labs Reviewed - No data to display  EKG None  Radiology No results found.  Procedures Procedures (including critical care time)  Medications Ordered in UC Medications - No data to display  Initial Impression / Assessment and Plan / UC Course  I have reviewed the triage vital signs and the nursing notes.  Pertinent labs & imaging results that were available during my care of the patient were reviewed by me and considered in my medical decision making (see chart for details).      Final Clinical Impressions(s) / UC Diagnoses   Final diagnoses:  Medication refill  Essential hypertension     Discharge Instructions     Take your blood pressure daily as directed Stay on the Lipitor See PCP for follow up   ED Prescriptions    Medication Sig Dispense Auth. Provider   metoprolol tartrate (LOPRESSOR)  50 MG tablet TAKE 1 & 1/2 (ONE & ONE-HALF) TABLETS BY MOUTH TWICE DAILY FOR BLOOD PRESSURE 90 tablet Eustace Moore, MD     Controlled Substance Prescriptions [place] Controlled Substance Registry consulted? Not Applicable   Eustace Moore, MD 06/05/17 2123

## 2017-06-05 NOTE — Discharge Instructions (Signed)
Take your blood pressure daily as directed Stay on the Lipitor See PCP for follow up

## 2017-06-27 ENCOUNTER — Ambulatory Visit (HOSPITAL_COMMUNITY)
Admission: EM | Admit: 2017-06-27 | Discharge: 2017-06-27 | Disposition: A | Discharge status: Home / Self Care | Payer: Self-pay | Attending: Internal Medicine | Admitting: Internal Medicine

## 2017-06-27 ENCOUNTER — Encounter (HOSPITAL_COMMUNITY): Payer: Self-pay | Admitting: Emergency Medicine

## 2017-06-27 DIAGNOSIS — M25551 Pain in right hip: Secondary | ICD-10-CM

## 2017-06-27 DIAGNOSIS — M545 Low back pain, unspecified: Secondary | ICD-10-CM

## 2017-06-27 MED ORDER — METHOCARBAMOL 750 MG PO TABS: 750.0000 mg | ORAL_TABLET | Freq: Four times a day (QID) | ORAL | 0 refills | Status: AC

## 2017-06-27 NOTE — Discharge Instructions (Signed)
Use anti-inflammatories for pain/swelling. You may take up to 800 mg Ibuprofen every 8 hours with food. You may supplement Ibuprofen with Tylenol [phone] mg every 8 hours.   Please use robaxin as needed  No heavy lifting  Expect gradual resolution over the next 2 weeks
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
## 2017-06-29 ENCOUNTER — Telehealth (HOSPITAL_COMMUNITY): Payer: Self-pay | Admitting: Emergency Medicine

## 2017-07-16 ENCOUNTER — Telehealth (HOSPITAL_COMMUNITY): Payer: Self-pay | Admitting: Emergency Medicine

## 2017-07-31 ENCOUNTER — Ambulatory Visit (HOSPITAL_COMMUNITY)
Admission: EM | Admit: 2017-07-31 | Discharge: 2017-07-31 | Disposition: A | Discharge status: Home / Self Care | Payer: Self-pay | Attending: Family Medicine | Admitting: Family Medicine

## 2017-07-31 ENCOUNTER — Encounter (HOSPITAL_COMMUNITY): Payer: Self-pay | Admitting: Emergency Medicine

## 2017-07-31 ENCOUNTER — Ambulatory Visit (INDEPENDENT_AMBULATORY_CARE_PROVIDER_SITE_OTHER): Payer: Self-pay

## 2017-07-31 ENCOUNTER — Other Ambulatory Visit: Payer: Self-pay

## 2017-07-31 DIAGNOSIS — M5442 Lumbago with sciatica, left side: Secondary | ICD-10-CM

## 2017-07-31 MED ORDER — IBUPROFEN 800 MG PO TABS: 800.0000 mg | ORAL_TABLET | Freq: Three times a day (TID) | ORAL | 0 refills | Status: DC | PRN

## 2017-07-31 MED ORDER — CYCLOBENZAPRINE HCL 5 MG PO TABS: 5.0000 mg | ORAL_TABLET | Freq: Three times a day (TID) | ORAL | 0 refills | Status: DC | PRN

## 2017-07-31 NOTE — Discharge Instructions (Signed)
Take ibuprofen 3 times a day with food. Take cyclobenzaprine as needed at night as a muscle relaxer Limit bending and lifting activities Recommend follow-up with an orthopedic specialist
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
## 2017-08-02 ENCOUNTER — Telehealth (HOSPITAL_COMMUNITY): Payer: Self-pay

## 2017-08-06 ENCOUNTER — Ambulatory Visit (HOSPITAL_COMMUNITY)
Admission: EM | Admit: 2017-08-06 | Discharge: 2017-08-06 | Disposition: A | Discharge status: Home / Self Care | Payer: Self-pay | Attending: Family Medicine | Admitting: Family Medicine

## 2017-08-06 ENCOUNTER — Encounter (HOSPITAL_COMMUNITY): Payer: Self-pay | Admitting: Emergency Medicine

## 2017-08-06 ENCOUNTER — Other Ambulatory Visit: Payer: Self-pay

## 2017-08-06 DIAGNOSIS — M545 Low back pain, unspecified: Secondary | ICD-10-CM

## 2017-08-06 DIAGNOSIS — Z0289 Encounter for other administrative examinations: Secondary | ICD-10-CM

## 2017-08-06 MED ORDER — MELOXICAM 15 MG PO TABS: 15.0000 mg | ORAL_TABLET | Freq: Every day | ORAL | 0 refills | Status: DC

## 2017-08-06 NOTE — Discharge Instructions (Signed)
Stop ibuprofen and start meloxicam once a day. Take with food. Muscle relaxer may want to be held while at work as may cause drowsiness.  Follow up with ortho on Wednesday as scheduled. Use of legs if doing any lifting or bending.

## 2017-08-06 NOTE — ED Triage Notes (Signed)
Patient needing a note lifting restrictions for work.
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
## 2017-09-13 ENCOUNTER — Encounter (HOSPITAL_COMMUNITY): Payer: Self-pay | Admitting: Emergency Medicine

## 2017-09-13 ENCOUNTER — Ambulatory Visit (HOSPITAL_COMMUNITY)
Admission: EM | Admit: 2017-09-13 | Discharge: 2017-09-13 | Disposition: A | Discharge status: Home / Self Care | Payer: Self-pay | Attending: Family Medicine | Admitting: Family Medicine

## 2017-09-13 DIAGNOSIS — I10 Essential (primary) hypertension: Secondary | ICD-10-CM

## 2017-09-13 DIAGNOSIS — Z760 Encounter for issue of repeat prescription: Secondary | ICD-10-CM

## 2017-09-13 MED ORDER — METOPROLOL TARTRATE 50 MG PO TABS: ORAL_TABLET | ORAL | 1 refills | Status: DC

## 2017-09-13 NOTE — ED Provider Notes (Addendum)
[facility]   [id number] 09/13/17 Arrival Time: 1048  CC: Medication refill  SUBJECTIVE:  [name] is a 52 y.o. male who presents for medication refill for HTN.  Currently taking metoprolol 50mg  1.5 tabs by mouth twice daily.  BP elevated in office.  BP averages 160/90 at home.  Denies vision changes, HA, dizziness, fatigue, chest pain, SOB, PND, abdominal pain, PE, dysuria, increased urinary frequency or urgency, changes in bowel habits.    ROS: As per HPI.  Past Medical History:  Diagnosis Date  . Chronic combined systolic and diastolic CHF (congestive heart failure) (HCC)    EF 25-30%  . Diastolic dysfunction   . Dyslipidemia   . Hyperlipidemia   . Hypertension   . Nonischemic dilated cardiomyopathy (HCC)    felt secondary to HTN with no ischemia on nuclear stress test 05/2012, EF 25-30%  . Shortness of breath    Past Surgical History:  Procedure Laterality Date  . KNEE ARTHROSCOPY Left 04/13/2016   Procedure: ARTHROSCOPY KNEE WITH DEBRIDEMENT;  Surgeon: Valeria Batman, MD;  Location: [facility];  Service: Orthopedics;  Laterality: Left;  . KNEE ARTHROSCOPY WITH MEDIAL MENISECTOMY Right 04/13/2016   Procedure: KNEE ARTHROSCOPY WITH MEDIAL MENISECTOMY;  Surgeon: Valeria Batman, MD;  Location: [facility];  Service: Orthopedics;  Laterality: Right;  . NASAL FRACTURE SURGERY     No Known Allergies No current facility-administered medications on file prior to encounter.    Current Outpatient Medications on File Prior to Encounter  Medication Sig Dispense Refill  . atorvastatin (LIPITOR) 40 MG tablet Take 1 tablet (40 mg total) by mouth daily at 6 PM. 30 tablet 1  . cyclobenzaprine (FLEXERIL) 5 MG tablet Take 1 tablet (5 mg total) by mouth 3 (three) times daily as needed for muscle spasms. 30 tablet 0  . meloxicam (MOBIC) 15 MG tablet Take 1 tablet (15 mg total) by mouth daily. 30 tablet 0  . Multiple Vitamin (MULTIVITAMIN WITH  MINERALS) TABS tablet Take 1 tablet by mouth daily.      Social History   Socioeconomic History  . Marital status: Married    Spouse name: Not on file  . Number of children: Not on file  . Years of education: Not on file  . Highest education level: Not on file  Occupational History  . Not on file  Social Needs  . Financial resource strain: Not on file  . Food insecurity:    Worry: Not on file    Inability: Not on file  . Transportation needs:    Medical: Not on file    Non-medical: Not on file  Tobacco Use  . Smoking status: Never Smoker  . Smokeless tobacco: Never Used  Substance and Sexual Activity  . Alcohol use: Yes    Alcohol/week: 24.0 standard drinks    Types: 24 Cans of beer per week    Comment: 2 beers per day  . Drug use: No  . Sexual activity: Yes  Lifestyle  . Physical activity:    Days per week: Not on file    Minutes per session: Not on file  . Stress: Not on file  Relationships  . Social connections:    Talks on phone: Not on file    Gets together: Not on file    Attends religious service: Not on file    Active member of club or organization: Not on file    Attends meetings of clubs or organizations: Not on file  Relationship status: Not on file  . Intimate partner violence:    Fear of current or ex partner: Not on file    Emotionally abused: Not on file    Physically abused: Not on file    Forced sexual activity: Not on file  Other Topics Concern  . Not on file  Social History Narrative  . Not on file   Family History  Problem Relation Age of Onset  . Hypertension Mother        Passed away when she is 58 secondary to hip surgery complications  . CAD Mother   . Heart attack Mother   . Hypertension Father   . Heart attack Maternal Grandmother      OBJECTIVE:  Vitals:   09/13/17 1105 09/13/17 1106 09/13/17 1136  BP:  (!) 187/117 (!) 161/91  Pulse: 78  70  Resp: 16    Temp: 97.9 F (36.6 C)    SpO2: 98%       General  appearance: AOx3 in no acute distress HEENT: NCAT; PERRL, EOMI grossly; oropharynx clear, uvula midline Neck: FROM Lungs: clear to auscultation bilaterally without adventitious breath sounds Heart: regular rate and rhythm.  Radial pulses 2+ symmetrical bilaterally.  No PE of the UE's or LE's Abdomen: soft, nondistended, normal active bowel sounds, nontender to palpation, no guarding Skin: warm and dry Psychological: alert and cooperative; normal mood and affect  ASSESSMENT & PLAN:  1. Essential hypertension   2. Medication refill     Meds ordered this encounter  Medications  . metoprolol tartrate (LOPRESSOR) 50 MG tablet    Sig: TAKE 1 & 1/2 (ONE & ONE-HALF) TABLETS BY MOUTH TWICE DAILY FOR BLOOD PRESSURE    Dispense:  90 tablet    Refill:  1    Please consider 90 day supplies to promote better adherence    Order Specific Question:   Supervising Provider    Answer:   Isa Rankin [[id number]]   Metoprolol refilled Please continue to monitor blood pressure at home and keep a log Eat a well balanced diet of fruits, vegetables and lean meats.  Avoid foods high in fat and salt Drink water.  At least half your body weight in ounces Exercise for at least 30 minutes daily Return or go to the ED if you have any new or worsening symptoms such as vision changes, fatigue, dizziness, chest pain, shortness of breath, nausea, swelling in your hands or feet, urinary symptoms, etc...  PCP assistance initiated.   Follow up with PCP or with [facility] for further management and evaluation of chronic hypertension  Reviewed expectations re: course of current medical issues. Questions answered. Outlined signs and symptoms indicating need for more acute intervention. Patient verbalized understanding. After Visit Summary given.    Rennis Harding, PA-C 09/13/17 1155

## 2017-09-13 NOTE — ED Triage Notes (Signed)
Pt states he had to cut back from twice a day to once a day on his metoprolol since hes almost out. Requesting refill.

## 2017-09-13 NOTE — Discharge Instructions (Signed)
Metoprolol refilled Please continue to monitor blood pressure at home and keep a log Eat a well balanced diet of fruits, vegetables and lean meats.  Avoid foods high in fat and salt Drink water.  At least half your body weight in ounces Exercise for at least 30 minutes daily Return or go to the ED if you have any new or worsening symptoms such as vision changes, fatigue, dizziness, chest pain, shortness of breath, nausea, swelling in your hands or feet, urinary symptoms, etc...  PCP assistance initiated.   Follow up with PCP or with [facility] for further management and evaluation of chronic hypertension

## 2018-01-02 ENCOUNTER — Encounter (HOSPITAL_COMMUNITY): Payer: Self-pay

## 2018-01-02 ENCOUNTER — Emergency Department (HOSPITAL_COMMUNITY): Payer: Self-pay

## 2018-01-02 ENCOUNTER — Emergency Department (HOSPITAL_COMMUNITY)
Admission: EM | Admit: 2018-01-02 | Discharge: 2018-01-02 | Disposition: A | Discharge status: Home / Self Care | Payer: Self-pay | Attending: Emergency Medicine | Admitting: Emergency Medicine

## 2018-01-02 ENCOUNTER — Other Ambulatory Visit: Payer: Self-pay

## 2018-01-02 DIAGNOSIS — Z79899 Other long term (current) drug therapy: Secondary | ICD-10-CM | POA: Insufficient documentation

## 2018-01-02 DIAGNOSIS — R002 Palpitations: Secondary | ICD-10-CM | POA: Insufficient documentation

## 2018-01-02 DIAGNOSIS — I110 Hypertensive heart disease with heart failure: Secondary | ICD-10-CM | POA: Insufficient documentation

## 2018-01-02 DIAGNOSIS — I10 Essential (primary) hypertension: Secondary | ICD-10-CM

## 2018-01-02 DIAGNOSIS — I5042 Chronic combined systolic (congestive) and diastolic (congestive) heart failure: Secondary | ICD-10-CM | POA: Insufficient documentation

## 2018-01-02 LAB — BASIC METABOLIC PANEL
Anion gap: 11 (ref 5–15)
BUN: 20 mg/dL (ref 6–20)
CO2: 26 mmol/L (ref 22–32)
CREATININE: 1.28 mg/dL — AB (ref 0.61–1.24)
Calcium: 9.8 mg/dL (ref 8.9–10.3)
Chloride: 103 mmol/L (ref 98–111)
GFR calc non Af Amer: >60 mL/min (ref >60)
Glucose, Bld: 85 mg/dL (ref 70–99)
POTASSIUM: 4.1 mmol/L (ref 3.5–5.1)
Sodium: 140 mmol/L (ref 135–145)

## 2018-01-02 LAB — CBC
HEMATOCRIT: 44.4 % (ref 39.0–52.0)
HEMOGLOBIN: 14.6 g/dL (ref 13.0–17.0)
MCH: 31.4 pg (ref 26.0–34.0)
MCHC: 32.9 g/dL (ref 30.0–36.0)
MCV: 95.5 fL (ref 80.0–100.0)
NRBC: 0 % (ref 0.0–0.2)
Platelets: 283 10*3/uL (ref 150–400)
RBC: 4.65 MIL/uL (ref 4.22–5.81)
RDW: 11.9 % (ref 11.5–15.5)
WBC: 7.3 10*3/uL (ref 4.0–10.5)

## 2018-01-02 LAB — POCT I-STAT TROPONIN I: Troponin i, poc: 0.01 ng/mL (ref 0.00–0.08)

## 2018-01-02 MED ORDER — HYDROCHLOROTHIAZIDE 25 MG PO TABS: 25.0000 mg | ORAL_TABLET | ORAL | 0 refills | Status: DC

## 2018-01-02 MED ORDER — METOPROLOL TARTRATE 100 MG PO TABS: 100.0000 mg | ORAL_TABLET | Freq: Two times a day (BID) | ORAL | 0 refills | Status: DC

## 2018-01-02 MED ORDER — ACETAMINOPHEN 325 MG PO TABS
650.0000 mg | ORAL_TABLET | Freq: Once | ORAL | Status: AC
  Administered 2018-01-02: 650 mg via ORAL
  Filled 2018-01-02: qty 2

## 2018-01-02 NOTE — ED Notes (Signed)
MD notified of elevated BP. No orders given at this time

## 2018-01-02 NOTE — ED Triage Notes (Signed)
Pt states that he has had left sided chest pain since yesterday that he describes as dull and heavy. Pt states that he was unable to sleep on his left side due to the pain.

## 2018-01-02 NOTE — ED Provider Notes (Signed)
[facility] Provider Note   CSN: [id number] Arrival date & time: 01/02/18  1548     History   Chief Complaint Chief Complaint  Patient presents with  . Chest Pain    HPI [name] is a 52 y.o. male.  HPI   He complains of palpitations present for 2 days, without chest pain, diaphoresis, nausea, vomiting, weakness or dizziness.  He states that he is taking his usual medications.  There are no other known modifying factors.  Past Medical History:  Diagnosis Date  . Chronic combined systolic and diastolic CHF (congestive heart failure) (HCC)    EF 25-30%  . Diastolic dysfunction   . Dyslipidemia   . Hyperlipidemia   . Hypertension   . Nonischemic dilated cardiomyopathy (HCC)    felt secondary to HTN with no ischemia on nuclear stress test 05/2012, EF 25-30%  . Shortness of breath     Patient Active Problem List   Diagnosis Date Noted  . Hyperlipidemia, on Lipitor 04/01/2017  . Alcohol use disorder, mild, abuse 06/30/2016  . Morbid obesity (HCC) 06/30/2016  . 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 HTN (hypertension) 05/25/2012    Past Surgical History:  Procedure Laterality Date  . KNEE ARTHROSCOPY Left 04/13/2016   Procedure: ARTHROSCOPY KNEE WITH DEBRIDEMENT;  Surgeon: Valeria Batman, MD;  Location: [facility];  Service: Orthopedics;  Laterality: Left;  . KNEE ARTHROSCOPY WITH MEDIAL MENISECTOMY Right 04/13/2016   Procedure: KNEE ARTHROSCOPY WITH MEDIAL MENISECTOMY;  Surgeon: Valeria Batman, MD;  Location: [facility];  Service: Orthopedics;  Laterality: Right;  . NASAL FRACTURE SURGERY          Home Medications    Prior to Admission medications   Medication Sig Start Date End Date Taking? Authorizing Provider  atorvastatin (LIPITOR) 40 MG tablet Take 1 tablet (40 mg total) by  mouth daily at 6 PM. 06/10/16  Yes Dorena Bodo, NP  Multiple Vitamin (MULTIVITAMIN WITH MINERALS) TABS tablet Take 1 tablet by mouth daily.   Yes [provider]  cyclobenzaprine (FLEXERIL) 5 MG tablet Take 1 tablet (5 mg total) by mouth 3 (three) times daily as needed for muscle spasms. Patient not taking: Reported on 01/02/2018 07/31/17   Eustace Moore, MD  hydrochlorothiazide (HYDRODIURIL) 25 MG tablet Take 1 tablet (25 mg total) by mouth every other day. 01/02/18   Mancel Bale, MD  meloxicam (MOBIC) 15 MG tablet Take 1 tablet (15 mg total) by mouth daily. Patient not taking: Reported on 01/02/2018 08/06/17   Linus Mako B, NP  metoprolol tartrate (LOPRESSOR) 100 MG tablet Take 1 tablet (100 mg total) by mouth 2 (two) times daily. 01/02/18   Mancel Bale, MD    Family History Family History  Problem Relation Age of Onset  . Hypertension Mother        Passed away when she is 40 secondary to hip surgery complications  . CAD Mother   . Heart attack Mother   . Hypertension Father   . Heart attack Maternal Grandmother     Social History Social History   Tobacco Use  . Smoking status: Never Smoker  . Smokeless tobacco: Never Used  Substance Use Topics  . Alcohol use: Yes    Alcohol/week: 24.0 standard drinks    Types: 24 Cans of beer per week    Comment: 2 beers per day  .  Drug use: No     Allergies   Patient has no known allergies.   Review of Systems Review of Systems  All other systems reviewed and are negative.    Physical Exam Updated Vital Signs BP (!) 171/114   Pulse 88   Temp 98 F (36.7 C) (Oral)   Resp (!) 21   Ht 5\' 10"  (1.778 m)   Wt 108.9 kg   SpO2 98%   BMI 34.44 kg/m   Physical Exam  Constitutional: He is oriented to person, place, and time. He appears well-developed and well-nourished.  HENT:  Head: Normocephalic and atraumatic.  Right Ear: External ear normal.  Left Ear: External ear normal.  Eyes: Pupils are  equal, round, and reactive to light. Conjunctivae and EOM are normal.  Neck: Normal range of motion and phonation normal. Neck supple.  Cardiovascular: Normal rate, regular rhythm and normal heart sounds.  Pulmonary/Chest: Effort normal and breath sounds normal. No respiratory distress. He exhibits no bony tenderness.  Abdominal: Soft. There is no tenderness.  Musculoskeletal: Normal range of motion.  Neurological: He is alert and oriented to person, place, and time. No cranial nerve deficit or sensory deficit. He exhibits normal muscle tone. Coordination normal.  Skin: Skin is warm, dry and intact.  Psychiatric: He has a normal mood and affect. His behavior is normal. Judgment and thought content normal.  Nursing note and vitals reviewed.    ED Treatments / Results  Labs (all labs ordered are listed, but only abnormal results are displayed) Labs Reviewed  BASIC METABOLIC PANEL - Abnormal; Notable for the following components:      Result Value   Creatinine, Ser 1.28 (*)    All other components within normal limits  CBC  I-STAT TROPONIN, ED  POCT I-STAT TROPONIN I    EKG EKG Interpretation  Date/Time:  Wednesday January 02 2018 15:58:27 EST Ventricular Rate:  100 PR Interval:    QRS Duration: 88 QT Interval:  338 QTC Calculation: 436 R Axis:   20 Text Interpretation:  Sinus tachycardia Abnormal R-wave progression, early transition Left ventricular hypertrophy Abnormal T, consider ischemia, inferior leads Confirmed by Margarita Grizzle [phone]), editor Josephine Igo ([id number]) on 01/02/2018 4:32:43 PM   Radiology Dg Chest 2 View  Result Date: 01/02/2018 CLINICAL DATA:  Left-sided chest pain since yesterday. EXAM: CHEST - 2 VIEW COMPARISON:  01/09/2015 FINDINGS: The heart size and mediastinal contours are within normal limits. Both lungs are clear. The visualized skeletal structures are unremarkable. IMPRESSION: No active cardiopulmonary disease. Electronically Signed   By: Francene Boyers M.D.   On: 01/02/2018 16:48    Procedures Procedures (including critical care time)  Medications Ordered in ED Medications  acetaminophen (TYLENOL) tablet 650 mg (650 mg Oral Given 01/02/18 1933)     Initial Impression / Assessment and Plan / ED Course  I have reviewed the triage vital signs and the nursing notes.  Pertinent labs & imaging results that were available during my care of the patient were reviewed by me and considered in my medical decision making (see chart for details).  Clinical Course as of Jan 02 1954  Wed Jan 02, 2018  1840 Normal  POCT i-Stat troponin I [EW]  1840 Normal except creatinine 1.28  Basic metabolic panel(!) [EW]  1841 Normal  CBC [EW]  1841 DG Chest 2 View [EW]  1841 No infiltrate or CHF, images reviewed by me  DG Chest 2 View [EW]  1930 Normal  POCT i-Stat troponin I [EW]  1931 Normal  Basic metabolic panel(!) [EW]  1931 Normal  Basic metabolic panel(!) [EW]  1931 Normal  CBC [EW]  1931 No infiltrate or CHF, images reviewed by me  DG Chest 2 View [EW]    Clinical Course User Index [EW] Mancel Bale, MD     Patient Vitals for the past 24 hrs:  BP Temp Temp src Pulse Resp SpO2 Height Weight  01/02/18 1842 (!) 171/114 - - 88 (!) 21 98 % - -  01/02/18 1556 (!) 195/106 98 F (36.7 C) Oral 98 (!) 27 99 % 5\' 10"  (1.778 m) 108.9 kg    7:32 PM Reevaluation with update and discussion. After initial assessment and treatment, an updated evaluation reveals he is comfortable has no complaints.  Findings discussed and questions answered. Mancel Bale   Medical Decision Making: Palpitations, without signs or symptoms of ACS.  Reassuring ED evaluation.  Doubt hypertensive urgency, ACS, metabolic instability or impending vascular collapse.  CRITICAL CARE-no Performed by: Mancel Bale  Nursing Notes Reviewed/ Care Coordinated Applicable Imaging Reviewed Interpretation of Laboratory Data incorporated into ED treatment  The  patient appears reasonably screened and/or stabilized for discharge and I doubt any other medical condition or other [facility] requiring further screening, evaluation, or treatment in the ED at this time prior to discharge.  Plan: Home Medications-continue current medications, increase metoprolol to 100 mg twice daily; Home Treatments-low-salt diet; return here if the recommended treatment, does not improve the symptoms; Recommended follow up-PCP checkup as soon as possible for ongoing management   Final Clinical Impressions(s) / ED Diagnoses   Final diagnoses:  Palpitations  Hypertension, unspecified type    ED Discharge Orders         Ordered    hydrochlorothiazide (HYDRODIURIL) 25 MG tablet  Every other day     01/02/18 1953    metoprolol tartrate (LOPRESSOR) 100 MG tablet  2 times daily    Note to Pharmacy:  Please consider 90 day supplies to promote better adherence   01/02/18 1953           Mancel Bale, MD 01/02/18 1956

## 2018-01-02 NOTE — Discharge Instructions (Addendum)
The testing today is reassuring.  Your blood pressure is slightly elevated.  We are increasing her blood pressure dose.  New prescriptions were sent to your pharmacy.  Try to stay on a low-salt diet.  Use the resource guide to find a doctor to see in 1 or 2 weeks.  They need to recheck your blood pressure.

## 2018-01-09 NOTE — Progress Notes (Deleted)
Patient ID: [name], male   DOB: [birth year], 52 y.o.   MRN: [id number]   After being seen in the ED for palpitations.  His workup was unremarkable and his dose of metoprolol was increased.

## 2018-01-17 ENCOUNTER — Inpatient Hospital Stay: Payer: Self-pay

## 2018-07-31 ENCOUNTER — Other Ambulatory Visit: Payer: Self-pay

## 2018-07-31 ENCOUNTER — Encounter (HOSPITAL_COMMUNITY): Payer: Self-pay

## 2018-07-31 ENCOUNTER — Ambulatory Visit (HOSPITAL_COMMUNITY)
Admission: EM | Admit: 2018-07-31 | Discharge: 2018-07-31 | Disposition: A | Discharge status: Home / Self Care | Payer: Self-pay | Attending: Emergency Medicine | Admitting: Emergency Medicine

## 2018-07-31 DIAGNOSIS — I160 Hypertensive urgency: Secondary | ICD-10-CM

## 2018-07-31 DIAGNOSIS — Z760 Encounter for issue of repeat prescription: Secondary | ICD-10-CM

## 2018-07-31 MED ORDER — HYDROCHLOROTHIAZIDE 12.5 MG PO TABS: 25.0000 mg | ORAL_TABLET | ORAL | 0 refills | Status: DC

## 2018-07-31 MED ORDER — METOPROLOL TARTRATE 100 MG PO TABS: 100.0000 mg | ORAL_TABLET | Freq: Two times a day (BID) | ORAL | 0 refills | Status: DC

## 2018-07-31 NOTE — ED Notes (Signed)
Pt now endorses CP x 3 days

## 2018-07-31 NOTE — ED Triage Notes (Signed)
Patient presents to Urgent Care with complaints of needing a metoprolol refill since only having two pills left. Patient reports he is trying to find a PCP but would like additional resources.

## 2018-07-31 NOTE — Discharge Instructions (Signed)
Take metoprolol as prescribed. May take 1 tab of HCTZ tonight. Take 1 tab of HCTZ tomorrow morning. Take 2 tabs of HCTZ daily thereafter. Important to follow-up with PCP for further blood pressure management. Go to ER for further evaluation and treatment if you develop lightheadedness, headache, change in vision, chest pain, palpitations, chest tightness, shortness of breath, severe abdominal pain.

## 2018-07-31 NOTE — ED Provider Notes (Signed)
[facility]    CSN: [id number] Arrival date & time: 07/31/18  1918     History   Chief Complaint Chief Complaint  Patient presents with  . Medication Refill    HPI [name] is a 53 y.o. male with history of CHF, nonischemic dilated cardiomyopathy, hypertension presenting for medication refill.  Patient states he has been rationing his metoprolol doses for the last few weeks due to running low.  Has not been taking HCTZ "in a while ".  Patient does not have PCP, looking for provider.  Patient denies headache, change in vision, chest pain, palpitations, chest tightness, shortness of breath, abdominal pain, lower extremity edema, claudication.  Patient does have infrequent, exertional chest pain the patient feels is stable.  Last known episode was last week while mowing lawn.  Patient denies diaphoresis, nausea, vomiting, lightheadedness, weakness shortness of breath.    Past Medical History:  Diagnosis Date  . Chronic combined systolic and diastolic CHF (congestive heart failure) (HCC)    EF 25-30%  . Diastolic dysfunction   . Dyslipidemia   . Hyperlipidemia   . Hypertension   . Nonischemic dilated cardiomyopathy (HCC)    felt secondary to HTN with no ischemia on nuclear stress test 05/2012, EF 25-30%  . Shortness of breath     Patient Active Problem List   Diagnosis Date Noted  . Hyperlipidemia, on Lipitor 04/01/2017  . Alcohol use disorder, mild, abuse 06/30/2016  . Morbid obesity (HCC) 06/30/2016  . 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 HTN (hypertension) 05/25/2012    Past Surgical History:  Procedure Laterality Date  . KNEE ARTHROSCOPY Left 04/13/2016   Procedure: ARTHROSCOPY KNEE WITH DEBRIDEMENT;  Surgeon: Valeria Batman, MD;  Location: [facility];  Service: Orthopedics;  Laterality: Left;  . KNEE ARTHROSCOPY WITH MEDIAL  MENISECTOMY Right 04/13/2016   Procedure: KNEE ARTHROSCOPY WITH MEDIAL MENISECTOMY;  Surgeon: Valeria Batman, MD;  Location: [facility];  Service: Orthopedics;  Laterality: Right;  . NASAL FRACTURE SURGERY         Home Medications    Prior to Admission medications   Medication Sig Start Date End Date Taking? Authorizing Provider  atorvastatin (LIPITOR) 40 MG tablet Take 1 tablet (40 mg total) by mouth daily at 6 PM. 06/10/16   Dorena Bodo, NP  hydrochlorothiazide (HYDRODIURIL) 12.5 MG tablet Take 2 tablets (25 mg total) by mouth every other day. 07/31/18   Hall-Potvin, [place], PA-C  metoprolol tartrate (LOPRESSOR) 100 MG tablet Take 1 tablet (100 mg total) by mouth 2 (two) times daily. 07/31/18   Hall-Potvin, [place], PA-C  Multiple Vitamin (MULTIVITAMIN WITH MINERALS) TABS tablet Take 1 tablet by mouth daily.    [provider]    Family History Family History  Problem Relation Age of Onset  . Hypertension Mother        Passed away when she is 69 secondary to hip surgery complications  . CAD Mother   . Heart attack Mother   . Hypertension Father   . Heart attack Maternal Grandmother     Social History Social History   Tobacco Use  . Smoking status: Never Smoker  . Smokeless tobacco: Never Used  Substance Use Topics  . Alcohol use: Yes    Alcohol/week: 24.0 standard drinks    Types: 24 Cans of beer per week    Comment: 2 beers per day  . Drug  use: No     Allergies   Patient has no known allergies.   Review of Systems As per HPI   Physical Exam Triage Vital Signs ED Triage Vitals  Enc Vitals Group     BP 07/31/18 1932 (!) 197/107     Pulse Rate 07/31/18 1932 85     Resp 07/31/18 1932 18     Temp 07/31/18 1932 97.9 F (36.6 C)     Temp Source 07/31/18 1932 Oral     SpO2 07/31/18 1932 97 %     Weight --      Height --      Head Circumference --      Peak Flow --      Pain Score 07/31/18 1931 0     Pain Loc --      Pain  Edu? --      Excl. in [place]? --    No data found.  Updated Vital Signs BP (!) 168/111 (BP Location: Left Arm)   Pulse 82   Temp 97.9 F (36.6 C) (Oral)   Resp 18   SpO2 97%   Visual Acuity Right Eye Distance:   Left Eye Distance:   Bilateral Distance:    Right Eye Near:   Left Eye Near:    Bilateral Near:     Physical Exam Vitals signs and nursing note reviewed.  Constitutional:      General: He is not in acute distress.    Appearance: He is obese. He is not diaphoretic.  HENT:     Head: Normocephalic and atraumatic.     Right Ear: Tympanic membrane, ear canal and external ear normal.     Left Ear: Tympanic membrane, ear canal and external ear normal.     Nose: Nose normal.     Mouth/Throat:     Mouth: Mucous membranes are moist.     Pharynx: Oropharynx is clear. No oropharyngeal exudate or posterior oropharyngeal erythema.  Eyes:     General: No scleral icterus.       Right eye: No discharge.        Left eye: No discharge.     Extraocular Movements: Extraocular movements intact.     Conjunctiva/sclera: Conjunctivae normal.     Pupils: Pupils are equal, round, and reactive to light.  Neck:     Musculoskeletal: Normal range of motion and neck supple. No muscular tenderness.     Vascular: No carotid bruit.  Cardiovascular:     Rate and Rhythm: Normal rate and regular rhythm.     Heart sounds: Normal heart sounds.  Pulmonary:     Effort: Pulmonary effort is normal. No respiratory distress.     Breath sounds: No wheezing or rales.  Abdominal:     General: Abdomen is flat.     Tenderness: There is no abdominal tenderness. There is no right CVA tenderness, left CVA tenderness or guarding.  Musculoskeletal:     Comments: Full active range of motion with 5/5 strength in upper and lower extremities bilaterally and symmetric.  Lymphadenopathy:     Cervical: No cervical adenopathy.  Skin:    Capillary Refill: Capillary refill takes less than 2 seconds.     Coloration:  Skin is not jaundiced or pale.     Findings: No bruising, erythema or rash.  Neurological:     General: No focal deficit present.     Mental Status: He is alert and oriented to person, place, and time.     Cranial Nerves:  No cranial nerve deficit.     Sensory: No sensory deficit.     Motor: No weakness.     Coordination: Coordination normal.     Gait: Gait normal.     Deep Tendon Reflexes: Reflexes normal.  Psychiatric:        Mood and Affect: Mood normal.        Behavior: Behavior normal.        Thought Content: Thought content normal.        Judgment: Judgment normal.      UC Treatments / Results  Labs (all labs ordered are listed, but only abnormal results are displayed) Labs Reviewed - No data to display  EKG   Radiology No results found.  Procedures Procedures (including critical care time)  Medications Ordered in UC Medications - No data to display  Initial Impression / Assessment and Plan / UC Course  I have reviewed the triage vital signs and the nursing notes.  Pertinent labs & imaging results that were available during my care of the patient were reviewed by me and considered in my medical decision making (see chart for details).     Pleasant 53 year old male presenting for medication refill.  Patient is noted to be hypertensive.  States that he has been rationing his medications due to cost.  States he is able to afford his medications today.  EG done in office, reviewed by me and compared to previous in December 2019: Normal sinus rhythm.  T wave inversion in leads V1, V2 that are stable and T wave inversion that is resolved in lead V6.  No ST elevation.  Overall slightly improved as compared to previous.  Reviewed case with  Wallis BambergMario Mani, [facility] who agrees with plan: Refill metoprolol, resume HCTZ at half dose x2 days, then full dose.  Close follow-up with PCP thereafter.  Strict return precautions were discussed, patient verbalized understanding of these as well  as treatment with which he was agreeable. Final Clinical Impressions(s) / UC Diagnoses   Final diagnoses:  Medication refill  Hypertensive urgency     Discharge Instructions     Take metoprolol as prescribed. May take 1 tab of HCTZ tonight. Take 1 tab of HCTZ tomorrow morning. Take 2 tabs of HCTZ daily thereafter. Important to follow-up with PCP for further blood pressure management. Go to ER for further evaluation and treatment if you develop lightheadedness, headache, change in vision, chest pain, palpitations, chest tightness, shortness of breath, severe abdominal pain.    ED Prescriptions    Medication Sig Dispense Auth. Provider   metoprolol tartrate (LOPRESSOR) 100 MG tablet Take 1 tablet (100 mg total) by mouth 2 (two) times daily. 60 tablet Hall-Potvin, [place], PA-C   hydrochlorothiazide (HYDRODIURIL) 12.5 MG tablet Take 2 tablets (25 mg total) by mouth every other day. 60 tablet Hall-Potvin, [place], PA-C     Controlled Substance Prescriptions [place] Controlled Substance Registry consulted? Not Applicable   Shea EvansHall-Potvin, , [place]PA-C 07/31/18 2037

## 2018-09-07 ENCOUNTER — Other Ambulatory Visit: Payer: Self-pay

## 2018-09-07 ENCOUNTER — Ambulatory Visit (HOSPITAL_COMMUNITY)
Admission: EM | Admit: 2018-09-07 | Discharge: 2018-09-07 | Disposition: A | Discharge status: Home / Self Care | Payer: Self-pay | Attending: Urgent Care | Admitting: Urgent Care

## 2018-09-07 ENCOUNTER — Encounter (HOSPITAL_COMMUNITY): Payer: Self-pay

## 2018-09-07 DIAGNOSIS — I10 Essential (primary) hypertension: Secondary | ICD-10-CM

## 2018-09-07 DIAGNOSIS — I428 Other cardiomyopathies: Secondary | ICD-10-CM

## 2018-09-07 DIAGNOSIS — Z760 Encounter for issue of repeat prescription: Secondary | ICD-10-CM

## 2018-09-07 DIAGNOSIS — I509 Heart failure, unspecified: Secondary | ICD-10-CM

## 2018-09-07 DIAGNOSIS — E669 Obesity, unspecified: Secondary | ICD-10-CM

## 2018-09-07 MED ORDER — METOPROLOL TARTRATE 100 MG PO TABS: 100.0000 mg | ORAL_TABLET | Freq: Two times a day (BID) | ORAL | 0 refills | Status: DC

## 2018-09-07 MED ORDER — HYDROCHLOROTHIAZIDE 12.5 MG PO TABS: 25.0000 mg | ORAL_TABLET | ORAL | 0 refills | Status: DC

## 2018-09-07 NOTE — ED Provider Notes (Signed)
MRN: [id number] DOB: 02-22-1965  Subjective:   [name] is a 53 y.o. male presenting for medication refills on his metoprolol.  Patient reports that he has tried to get in with his PCP and the cardiologist they recommended but has been unsuccessful.  He reports significant lifestyle modifications, has been eating much healthier and exercising more regularly.   No current facility-administered medications for this encounter.   Current Outpatient Medications:  .  atorvastatin (LIPITOR) 40 MG tablet, Take 1 tablet (40 mg total) by mouth daily at 6 PM., Disp: 30 tablet, Rfl: 1 .  hydrochlorothiazide (HYDRODIURIL) 12.5 MG tablet, Take 2 tablets (25 mg total) by mouth every other day., Disp: 60 tablet, Rfl: 0 .  metoprolol tartrate (LOPRESSOR) 100 MG tablet, Take 1 tablet (100 mg total) by mouth 2 (two) times daily., Disp: 60 tablet, Rfl: 0 .  Multiple Vitamin (MULTIVITAMIN WITH MINERALS) TABS tablet, Take 1 tablet by mouth daily., Disp: , Rfl:     No Known Allergies   Past Medical History:  Diagnosis Date  . Chronic combined systolic and diastolic CHF (congestive heart failure) (HCC)    EF 25-30%  . Diastolic dysfunction   . Dyslipidemia   . Hyperlipidemia   . Hypertension   . Nonischemic dilated cardiomyopathy ([place])    felt secondary to HTN with no ischemia on nuclear stress test 05/2012, EF 25-30%  . Shortness of breath      Past Surgical History:  Procedure Laterality Date  . KNEE ARTHROSCOPY Left 04/13/2016   Procedure: ARTHROSCOPY KNEE WITH DEBRIDEMENT;  Surgeon: Garald Balding, MD;  Location: [place];  Service: Orthopedics;  Laterality: Left;  . KNEE ARTHROSCOPY WITH MEDIAL MENISECTOMY Right 04/13/2016   Procedure: KNEE ARTHROSCOPY WITH MEDIAL MENISECTOMY;  Surgeon: Garald Balding, MD;  Location: [place];  Service: Orthopedics;  Laterality: Right;  . NASAL FRACTURE SURGERY      Review of Systems  Constitutional: Negative for  fever and malaise/fatigue.  HENT: Negative for congestion, ear pain, sinus pain and sore throat.   Eyes: Negative for blurred vision, double vision, discharge and redness.  Respiratory: Negative for cough, hemoptysis, shortness of breath and wheezing.   Cardiovascular: Negative for chest pain.  Gastrointestinal: Negative for abdominal pain, diarrhea, nausea and vomiting.  Genitourinary: Negative for dysuria, flank pain and hematuria.  Musculoskeletal: Negative for myalgias.  Skin: Negative for rash.  Neurological: Negative for dizziness, weakness and headaches.  Psychiatric/Behavioral: Negative for depression and substance abuse.     Objective:   Vitals: BP 140/83 (BP Location: Right Arm)   Pulse 78   Temp 98.7 F (37.1 C) (Oral)   Resp 18   SpO2 100%   Physical Exam Constitutional:      General: He is not in acute distress.    Appearance: Normal appearance. He is well-developed. He is obese. He is not ill-appearing, toxic-appearing or diaphoretic.  HENT:     Head: Normocephalic and atraumatic.     Right Ear: External ear normal.     Left Ear: External ear normal.     Nose: Nose normal.     Mouth/Throat:     Mouth: Mucous membranes are moist.     Pharynx: Oropharynx is clear.  Eyes:     General: No scleral icterus.    Extraocular Movements: Extraocular movements intact.     Pupils: Pupils are equal, round, and reactive to light.  Cardiovascular:     Rate and Rhythm: Normal rate and regular rhythm.  Heart sounds: Normal heart sounds. No murmur. No friction rub. No gallop.   Pulmonary:     Effort: Pulmonary effort is normal. No respiratory distress.     Breath sounds: Normal breath sounds. No stridor. No wheezing, rhonchi or rales.  Neurological:     Mental Status: He is alert and oriented to person, place, and time.  Psychiatric:        Mood and Affect: Mood normal.        Behavior: Behavior normal.        Thought Content: Thought content normal.      Assessment and Plan :   1. Medication refill   2. Essential hypertension   3. Obesity, unspecified classification, unspecified obesity type, unspecified whether serious comorbidity present   4. Nonischemic cardiomyopathy (HCC)   5. Congestive heart failure, unspecified HF chronicity, unspecified heart failure type [facility])     Patient has reassuring physical exam findings, will refill metoprolol for 60 days.  Provided patient with information to [facility] internal medicine to establish care.  Also will recommend patient contact [facility] for management of his nonischemic cardiomyopathy, CHF.  Vital signs very reassuring today.  Encourage patient to continue healthy lifestyle changes.  Strict ER precautions.   Wallis Bamberg, PA-C 09/07/18 1501

## 2018-09-07 NOTE — Discharge Instructions (Addendum)
For elevated blood pressure, make sure you are monitoring salt in your diet.  Do not eat restaurant foods and limit processed foods at home.  Processed foods include things like frozen meals preseason meats and dinners.  Make sure your pain attention to sodium labels on foods you by at the grocery store.  For seasoning you can use a brand called Mrs. Dash which includes a lot of salt free seasonings. ° °Salads - kale, spinach, cabbage, spring mix; use seeds like pumpkin seeds or sunflower seeds, almonds; you can also use 1-2 hard boiled eggs in your salads °Fruits - avocadoes, berries (blueberries, raspberries, blackberries), apples, oranges, pomegranate, grapefruit °Vegetables - aspargus, cauliflower, broccoli, green beans, brussel spouts, bell peppers; stay away from starchy vegetables like potatoes, carrots, peas ° °Regarding meat it is better to eat lean meats and limit your red meat consumption including pork.  Wild caught fish, chicken breast are good options. ° °Do not eat any foods on this list that you are allergic to. ° °

## 2018-09-07 NOTE — ED Triage Notes (Addendum)
Pt present he needs a refill on his metoprolol tartrate 100mg   medication. Pt run out of his medication today.

## 2018-12-16 ENCOUNTER — Encounter (HOSPITAL_COMMUNITY): Payer: Self-pay

## 2018-12-16 ENCOUNTER — Other Ambulatory Visit: Payer: Self-pay

## 2018-12-16 ENCOUNTER — Ambulatory Visit (HOSPITAL_COMMUNITY)
Admission: EM | Admit: 2018-12-16 | Discharge: 2018-12-16 | Disposition: A | Discharge status: Home / Self Care | Payer: Self-pay | Attending: Family Medicine | Admitting: Family Medicine

## 2018-12-16 DIAGNOSIS — I10 Essential (primary) hypertension: Secondary | ICD-10-CM

## 2018-12-16 MED ORDER — METOPROLOL TARTRATE 100 MG PO TABS: 100.0000 mg | ORAL_TABLET | Freq: Two times a day (BID) | ORAL | 2 refills | Status: DC

## 2018-12-16 NOTE — ED Triage Notes (Signed)
Pt states he needs a medication refill on his B/P med. Metoprolol 100 mg.

## 2018-12-18 NOTE — ED Provider Notes (Signed)
[facility]   [id number] 12/16/18 Arrival Time: 1208  ASSESSMENT & PLAN:  1. Uncontrolled hypertension     Will begin: Meds ordered this encounter  Medications  . metoprolol tartrate (LOPRESSOR) 100 MG tablet    Sig: Take 1 tablet (100 mg total) by mouth 2 (two) times daily.    Dispense:  60 tablet    Refill:  2    Please consider 90 day supplies to promote better adherence   No signs/symptoms of hypertensive urgency.  Follow-up Information    Schedule an appointment as soon as possible for a visit  with Primary Care at [facility].   Specialty: Family Medicine Contact information: [address] [place] [address] [phone]          Reviewed expectations re: course of current medical issues. Questions answered. Outlined signs and symptoms indicating need for more acute intervention. Patient verbalized understanding. After Visit Summary given.   SUBJECTIVE:  [name] is a 53 y.o. male who presents with concerns regarding increased blood pressures. He has been out of his metoprolol.  He reports taking medications as instructed, no medication side effects noted, no TIA's, no chest pain on exertion, no dyspnea on exertion, no swelling of ankles, no orthostatic dizziness or lightheadedness and no palpitations.  Denies symptoms of chest pain, palpations, orthopnea, nocturnal dyspnea, or LE edema.  Social History   Tobacco Use  Smoking Status Never Smoker  Smokeless Tobacco Never Used    ROS: As per HPI. All other systems negative.   OBJECTIVE:  Vitals:   12/16/18 1227 12/16/18 1229  BP:  (!) 171/97  Resp:  18  Temp:  98.5 F (36.9 C)  TempSrc:  Oral  SpO2:  100%  Weight: 108.4 kg     General appearance: alert; no distress Eyes: PERRLA; EOMI HENT: normocephalic; atraumatic Neck: supple Lungs: clear to auscultation bilaterally Heart: regular rate and rhythm without murmer Abdomen: soft, non-tender;  bowel sounds normal Extremities: no edema; symmetrical with no gross deformities Skin: warm and dry Psychological: alert and cooperative; normal mood and affect    No Known Allergies  Past Medical History:  Diagnosis Date  . Chronic combined systolic and diastolic CHF (congestive heart failure) (HCC)    EF 25-30%  . Diastolic dysfunction   . Dyslipidemia   . Hyperlipidemia   . Hypertension   . Nonischemic dilated cardiomyopathy (HCC)    felt secondary to HTN with no ischemia on nuclear stress test 05/2012, EF 25-30%  . Shortness of breath    Social History   Socioeconomic History  . Marital status: Married    Spouse name: Not on file  . Number of children: Not on file  . Years of education: Not on file  . Highest education level: Not on file  Occupational History  . Not on file  Social Needs  . Financial resource strain: Not on file  . Food insecurity    Worry: Not on file    Inability: Not on file  . Transportation needs    Medical: Not on file    Non-medical: Not on file  Tobacco Use  . Smoking status: Never Smoker  . Smokeless tobacco: Never Used  Substance and Sexual Activity  . Alcohol use: Yes    Alcohol/week: 24.0 standard drinks    Types: 24 Cans of beer per week    Comment: 2 beers per day  . Drug use: No  . Sexual activity: Yes  Lifestyle  .  Physical activity    Days per week: Not on file    Minutes per session: Not on file  . Stress: Not on file  Relationships  . Social Herbalist on phone: Not on file    Gets together: Not on file    Attends religious service: Not on file    Active member of club or organization: Not on file    Attends meetings of clubs or organizations: Not on file    Relationship status: Not on file  . Intimate partner violence    Fear of current or ex partner: Not on file    Emotionally abused: Not on file    Physically abused: Not on file    Forced sexual activity: Not on file  Other Topics Concern  . Not on  file  Social History Narrative  . Not on file   Family History  Problem Relation Age of Onset  . Hypertension Mother        Passed away when she is 75 secondary to hip surgery complications  . CAD Mother   . Heart attack Mother   . Hypertension Father   . Heart attack Maternal Grandmother    Past Surgical History:  Procedure Laterality Date  . KNEE ARTHROSCOPY Left 04/13/2016   Procedure: ARTHROSCOPY KNEE WITH DEBRIDEMENT;  Surgeon: Garald Balding, MD;  Location: [place];  Service: Orthopedics;  Laterality: Left;  . KNEE ARTHROSCOPY WITH MEDIAL MENISECTOMY Right 04/13/2016   Procedure: KNEE ARTHROSCOPY WITH MEDIAL MENISECTOMY;  Surgeon: Garald Balding, MD;  Location: [place];  Service: Orthopedics;  Laterality: Right;  . NASAL FRACTURE SURGERY        Vanessa Kick, MD 12/18/18 0930

## 2019-04-05 ENCOUNTER — Ambulatory Visit: Payer: Self-pay | Attending: Internal Medicine

## 2019-04-05 DIAGNOSIS — Z23 Encounter for immunization: Secondary | ICD-10-CM

## 2019-04-05 NOTE — Progress Notes (Signed)
   Covid-19 Vaccination Clinic  Name:  [name]    MRN: [id number] DOB: September 18, 1965  04/05/2019  [name] was observed post Covid-19 immunization for 15 minutes without incident. He was provided with Vaccine Information Sheet and instruction to access the V-Safe system.   [name] was instructed to call 911 with any severe reactions post vaccine: Marland Kitchen Difficulty breathing  . Swelling of face and throat  . A fast heartbeat  . A bad rash all over body  . Dizziness and weakness   Immunizations Administered    Name Date Dose VIS Date Route   Pfizer COVID-19 Vaccine 04/05/2019  9:34 AM 0.3 mL 01/03/2019 Intramuscular   Manufacturer: [employer], [employer]   Lot: [id number]   NDC: [address]-2

## 2019-04-28 ENCOUNTER — Ambulatory Visit: Payer: Self-pay | Attending: Internal Medicine

## 2019-04-28 DIAGNOSIS — Z23 Encounter for immunization: Secondary | ICD-10-CM

## 2019-04-28 NOTE — Progress Notes (Signed)
   Covid-19 Vaccination Clinic  Name:  [name]    MRN: [id number] DOB: 05/25/1965  04/28/2019  [name] was observed post Covid-19 immunization for 15 minutes without incident. He was provided with Vaccine Information Sheet and instruction to access the V-Safe system.   [name] was instructed to call 911 with any severe reactions post vaccine: Marland Kitchen Difficulty breathing  . Swelling of face and throat  . A fast heartbeat  . A bad rash all over body  . Dizziness and weakness   Immunizations Administered    Name Date Dose VIS Date Route   Pfizer COVID-19 Vaccine 04/28/2019 11:45 AM 0.3 mL 01/03/2019 Intramuscular   Manufacturer: [employer], [employer]   Lot: [id number]   NDC: [address]-4

## 2019-04-29 ENCOUNTER — Ambulatory Visit: Payer: Self-pay

## 2019-05-07 ENCOUNTER — Other Ambulatory Visit: Payer: Self-pay

## 2019-05-07 ENCOUNTER — Ambulatory Visit (HOSPITAL_COMMUNITY)
Admission: EM | Admit: 2019-05-07 | Discharge: 2019-05-07 | Disposition: A | Discharge status: Home / Self Care | Payer: Self-pay | Attending: Urgent Care | Admitting: Urgent Care

## 2019-05-07 DIAGNOSIS — Z8679 Personal history of other diseases of the circulatory system: Secondary | ICD-10-CM

## 2019-05-07 DIAGNOSIS — I10 Essential (primary) hypertension: Secondary | ICD-10-CM

## 2019-05-07 DIAGNOSIS — Z760 Encounter for issue of repeat prescription: Secondary | ICD-10-CM

## 2019-05-07 DIAGNOSIS — I160 Hypertensive urgency: Secondary | ICD-10-CM

## 2019-05-07 MED ORDER — METOPROLOL TARTRATE 100 MG PO TABS: 100.0000 mg | ORAL_TABLET | Freq: Two times a day (BID) | ORAL | 0 refills | Status: DC

## 2019-05-07 NOTE — ED Provider Notes (Signed)
[facility]   MRN: [id number] DOB: 11-11-1965  Subjective:   [name] is a 54 y.o. male presenting for medication refill of metoprolol.  Patient has his hydrochlorothiazide.  He has been using our clinic as his primary care since he has not been able to establish care with a new provider since pandemic started a year ago.  He has a history of nonischemic dilated cardiomyopathy, combined systolic and diastolic CHF, hyperlipidemia.  No current facility-administered medications for this encounter.  Current Outpatient Medications:  .  atorvastatin (LIPITOR) 40 MG tablet, Take 1 tablet (40 mg total) by mouth daily at 6 PM., Disp: 30 tablet, Rfl: 1 .  hydrochlorothiazide (HYDRODIURIL) 12.5 MG tablet, Take 2 tablets (25 mg total) by mouth every other day., Disp: 60 tablet, Rfl: 0 .  metoprolol tartrate (LOPRESSOR) 100 MG tablet, Take 1 tablet (100 mg total) by mouth 2 (two) times daily., Disp: 60 tablet, Rfl: 2 .  Multiple Vitamin (MULTIVITAMIN WITH MINERALS) TABS tablet, Take 1 tablet by mouth daily., Disp: , Rfl:    No Known Allergies  Past Medical History:  Diagnosis Date  . Chronic combined systolic and diastolic CHF (congestive heart failure) (HCC)    EF 25-30%  . Diastolic dysfunction   . Dyslipidemia   . Hyperlipidemia   . Hypertension   . Nonischemic dilated cardiomyopathy (HCC)    felt secondary to HTN with no ischemia on nuclear stress test 05/2012, EF 25-30%  . Shortness of breath      Past Surgical History:  Procedure Laterality Date  . KNEE ARTHROSCOPY Left 04/13/2016   Procedure: ARTHROSCOPY KNEE WITH DEBRIDEMENT;  Surgeon: Valeria Batman, MD;  Location: [facility];  Service: Orthopedics;  Laterality: Left;  . KNEE ARTHROSCOPY WITH MEDIAL MENISECTOMY Right 04/13/2016   Procedure: KNEE ARTHROSCOPY WITH MEDIAL MENISECTOMY;  Surgeon: Valeria Batman, MD;  Location: [facility];  Service: Orthopedics;  Laterality: Right;  . NASAL  FRACTURE SURGERY      Family History  Problem Relation Age of Onset  . Hypertension Mother        Passed away when she is 30 secondary to hip surgery complications  . CAD Mother   . Heart attack Mother   . Hypertension Father   . Heart attack Maternal Grandmother     Social History   Tobacco Use  . Smoking status: Never Smoker  . Smokeless tobacco: Never Used  Substance Use Topics  . Alcohol use: Yes    Alcohol/week: 24.0 standard drinks    Types: 24 Cans of beer per week    Comment: 2 beers per day  . Drug use: No    Review of Systems  Constitutional: Negative for fever and malaise/fatigue.  HENT: Negative for congestion, ear pain, sinus pain and sore throat.   Eyes: Negative for blurred vision, double vision, discharge and redness.  Respiratory: Negative for cough, hemoptysis, shortness of breath and wheezing.   Cardiovascular: Negative for chest pain.  Gastrointestinal: Negative for abdominal pain, diarrhea, nausea and vomiting.  Genitourinary: Negative for dysuria, flank pain and hematuria.  Musculoskeletal: Negative for myalgias.  Skin: Negative for rash.  Neurological: Negative for dizziness, tingling, sensory change, speech change, focal weakness, weakness and headaches.  Psychiatric/Behavioral: Negative for depression and substance abuse.     Objective:   Vitals: BP (!) 190/124   Pulse 91   Temp 97.9 F (36.6 C)   Resp 16   SpO2 96%   BP 180/117 on recheck  by PA-Amisha Pospisil.   BP Readings from Last 3 Encounters:  05/07/19 (!) 190/124  12/16/18 (!) 171/97  09/07/18 140/83   Physical Exam Constitutional:      General: He is not in acute distress.    Appearance: Normal appearance. He is well-developed. He is not ill-appearing, toxic-appearing or diaphoretic.  HENT:     Head: Normocephalic and atraumatic.     Right Ear: External ear normal.     Left Ear: External ear normal.     Nose: Nose normal.     Mouth/Throat:     Mouth: Mucous membranes are  moist.     Pharynx: Oropharynx is clear.  Eyes:     General: No scleral icterus.    Extraocular Movements: Extraocular movements intact.     Pupils: Pupils are equal, round, and reactive to light.  Neck:     Vascular: No carotid bruit.  Cardiovascular:     Rate and Rhythm: Normal rate and regular rhythm.     Heart sounds: Normal heart sounds. No murmur. No friction rub. No gallop.   Pulmonary:     Effort: Pulmonary effort is normal. No respiratory distress.     Breath sounds: Normal breath sounds. No stridor. No wheezing, rhonchi or rales.  Musculoskeletal:     Cervical back: Normal range of motion and neck supple.     Right lower leg: No edema.     Left lower leg: No edema.  Skin:    General: Skin is warm and dry.  Neurological:     General: No focal deficit present.     Mental Status: He is alert and oriented to person, place, and time.     Cranial Nerves: No cranial nerve deficit.     Motor: No weakness.     Coordination: Coordination normal.     Gait: Gait normal.     Deep Tendon Reflexes: Reflexes normal.  Psychiatric:        Mood and Affect: Mood normal.        Behavior: Behavior normal.        Thought Content: Thought content normal.        Judgment: Judgment normal.      Assessment and Plan :   1. Essential hypertension   2. Medication refill   3. Hypertensive urgency   4. History of heart failure     Emphasized to patient need to have better control of his blood pressure.  Counseled on dietary modifications, compliance with his medications.  Discussed monitoring parameters and recommended he come back in 1 to 2 weeks for additional help with his blood pressure since he may not be able to get in with a new PCP soon.  Strict ER precautions. Counseled patient on potential for adverse effects with medications prescribed today, patient verbalized understanding.    Jaynee Eagles, [place] 05/07/19 [phone]

## 2019-05-07 NOTE — Discharge Instructions (Addendum)
If your blood pressure stays above 150 systolic (that's the top number) then come back for a recheck in 2 weeks. Come sooner if your blood pressure remains above 160 systolic.   For diabetes or elevated blood sugar, please make sure you are avoiding starchy, carbohydrate foods like pasta, breads, pastry, rice, potatoes, desserts. These foods can elevated your blood sugar. Also, avoid sodas, sweet teas, sugary beverages, fruit juices.  Drinking plain water will be much more helpful, try 64 ounces of water daily.  It is okay to flavor your water naturally by cutting cucumber or lemon and mint or lime, placing it in a picture with water and drinking it over a period of 2 to 3 days as long as it remains refrigerated.    For elevated blood pressure, make sure you are monitoring salt in your diet.  Do not eat restaurant foods and limit processed foods at home, prepare/cook your own foods at home.  Processed foods include things like frozen meals preseasoned meats and dinners, deli meats, canned foods as they are high in sodium/salt.  Make sure your pain attention to sodium labels on foods you by at the grocery store.  For seasoning you can use a brand called Mrs. Dash which includes a lot of salt free seasonings.  Salads - kale, spinach, cabbage, spring mix; use seeds like pumpkin seeds or sunflower seeds, almonds, walnuts or pecans; you can also use 1-2 hard boiled eggs in your salads Fruits - avocadoes, berries (blueberries, raspberries, blackberries), apples, oranges Vegetables - aspargus, cauliflower, broccoli, green beans, brussel spouts, bell peppers; stay away from starchy vegetables like potatoes, carrots, peas  Regarding meat it is better to eat lean meats and limit your red meat consumption including pork.  Wild caught fish, chicken breast are good options.  DO NOT EAT ANY FOODS ON THIS LIST THAT YOU ARE ALLERGIC TO.

## 2019-05-07 NOTE — ED Triage Notes (Signed)
Pt requesting refill of metoprolol 100mg  BID, has been out for 2 days

## 2019-09-02 ENCOUNTER — Encounter (HOSPITAL_COMMUNITY): Payer: Self-pay | Admitting: Emergency Medicine

## 2019-09-02 ENCOUNTER — Other Ambulatory Visit: Payer: Self-pay

## 2019-09-02 ENCOUNTER — Ambulatory Visit (HOSPITAL_COMMUNITY)
Admission: EM | Admit: 2019-09-02 | Discharge: 2019-09-02 | Disposition: A | Discharge status: Home / Self Care | Payer: Self-pay | Attending: Urgent Care | Admitting: Urgent Care

## 2019-09-02 DIAGNOSIS — Z760 Encounter for issue of repeat prescription: Secondary | ICD-10-CM

## 2019-09-02 DIAGNOSIS — I10 Essential (primary) hypertension: Secondary | ICD-10-CM

## 2019-09-02 MED ORDER — ATORVASTATIN CALCIUM 40 MG PO TABS: 40.0000 mg | ORAL_TABLET | Freq: Every day | ORAL | 0 refills | Status: DC

## 2019-09-02 MED ORDER — METOPROLOL TARTRATE 100 MG PO TABS: 100.0000 mg | ORAL_TABLET | Freq: Two times a day (BID) | ORAL | 0 refills | Status: DC

## 2019-09-02 MED ORDER — LOSARTAN POTASSIUM 50 MG PO TABS: 50.0000 mg | ORAL_TABLET | Freq: Every day | ORAL | 0 refills | Status: DC

## 2019-09-02 MED ORDER — HYDROCHLOROTHIAZIDE 12.5 MG PO TABS: 12.5000 mg | ORAL_TABLET | ORAL | 0 refills | Status: DC

## 2019-09-02 NOTE — ED Triage Notes (Signed)
Pt presents to [facility] for refill of his BP meds, states he has been out for 2 days, does not currently have a PCP

## 2019-09-02 NOTE — Discharge Instructions (Signed)
Start losartan together with your other medications in 2 days. Contact [facility] to get your new PCP.  For diabetes or elevated blood sugar, please make sure you are avoiding starchy, carbohydrate foods like pasta, breads, pastry, rice, potatoes, desserts. These foods can elevated your blood sugar. Also, avoid sodas, sweet teas, sugary beverages, fruit juices.  Drinking plain water will be much more helpful, try 64 ounces of water daily.  It is okay to flavor your water naturally by cutting cucumber, lemon, mint or lime, placing it in a picture with water and drinking it over a period of 2 to 3 days as long as it remains refrigerated.    For elevated blood pressure, make sure you are monitoring salt in your diet.  Do not eat restaurant foods and limit processed foods at home, prepare/cook your own foods at home.  Processed foods include things like frozen meals preseasoned meats and dinners, deli meats, canned foods as they are high in sodium/salt.  Make sure your pain attention to sodium labels on foods you by at the grocery store.  For seasoning you can use a brand called Mrs. Dash which includes a lot of salt free seasonings.  Salads - kale, spinach, cabbage, spring mix; use seeds like pumpkin seeds or sunflower seeds, almonds, walnuts or pecans; you can also use 1-2 hard boiled eggs in your salads Fruits - avocadoes, berries (blueberries, raspberries, blackberries), apples, oranges, pomegranate, pear; avoid eating bananas, grapes regularly Vegetables - aspargus, cauliflower, broccoli, green beans, brussel spouts, bell peppers; stay away from starchy vegetables like potatoes, carrots, peas  Regarding meat it is better to eat lean meats and limit your red meat including pork to once a week.  Wild caught fish, chicken breast are good options as they tend to be leaner sources of good protein.   DO NOT EAT ANY FOODS ON THIS LIST THAT YOU ARE ALLERGIC TO.

## 2019-09-02 NOTE — ED Provider Notes (Signed)
[facility]   MRN: [id number] DOB: 04-29-1965  Subjective:   [name] is a 54 y.o. male presenting for medication refill.  Patient is taking hydrochlorothiazide at 12.5 once daily.  Is also on metoprolol and atorvastatin.  Does not have a PCP.  Has a history of CHF, nonischemic dilated cardiomyopathy.  Denies fever, headache, confusion, dizziness, weakness, chest pain, shortness of breath, belly pain, hematuria, lower leg swelling.  No current facility-administered medications for this encounter.  Current Outpatient Medications:    atorvastatin (LIPITOR) 40 MG tablet, Take 1 tablet (40 mg total) by mouth daily at 6 PM., Disp: 30 tablet, Rfl: 1   hydrochlorothiazide (HYDRODIURIL) 12.5 MG tablet, Take 2 tablets (25 mg total) by mouth every other day., Disp: 60 tablet, Rfl: 0   metoprolol tartrate (LOPRESSOR) 100 MG tablet, Take 1 tablet (100 mg total) by mouth 2 (two) times daily., Disp: 180 tablet, Rfl: 0   Multiple Vitamin (MULTIVITAMIN WITH MINERALS) TABS tablet, Take 1 tablet by mouth daily., Disp: , Rfl:    No Known Allergies  Past Medical History:  Diagnosis Date   Chronic combined systolic and diastolic CHF (congestive heart failure) (HCC)    EF 25-30%   Diastolic dysfunction    Dyslipidemia    Hyperlipidemia    Hypertension    Nonischemic dilated cardiomyopathy (HCC)    felt secondary to HTN with no ischemia on nuclear stress test 05/2012, EF 25-30%   Shortness of breath      Past Surgical History:  Procedure Laterality Date   KNEE ARTHROSCOPY Left 04/13/2016   Procedure: ARTHROSCOPY KNEE WITH DEBRIDEMENT;  Surgeon: Valeria Batman, MD;  Location: [facility];  Service: Orthopedics;  Laterality: Left;   KNEE ARTHROSCOPY WITH MEDIAL MENISECTOMY Right 04/13/2016   Procedure: KNEE ARTHROSCOPY WITH MEDIAL MENISECTOMY;  Surgeon: Valeria Batman, MD;  Location: [facility];  Service: Orthopedics;  Laterality: Right;    NASAL FRACTURE SURGERY      Family History  Problem Relation Age of Onset   Hypertension Mother        Passed away when she is 72 secondary to hip surgery complications   CAD Mother    Heart attack Mother    Hypertension Father    Heart attack Maternal Grandmother     Social History   Tobacco Use   Smoking status: Never Smoker   Smokeless tobacco: Never Used  Vaping Use   Vaping Use: Never used  Substance Use Topics   Alcohol use: Yes    Alcohol/week: 24.0 standard drinks    Types: 24 Cans of beer per week    Comment: 2 beers per day   Drug use: No    ROS   Objective:   Vitals: BP (!) 188/115 (BP Location: Right Arm)    Pulse 69    Temp 98.3 F (36.8 C) (Oral)    Resp 18    SpO2 97%   BP recheck was 189/125.  Physical Exam Constitutional:      General: He is not in acute distress.    Appearance: Normal appearance. He is well-developed. He is not ill-appearing, toxic-appearing or diaphoretic.  HENT:     Head: Normocephalic and atraumatic.     Right Ear: External ear normal.     Left Ear: External ear normal.     Nose: Nose normal.     Mouth/Throat:     Mouth: Mucous membranes are moist.     Pharynx: Oropharynx is clear.  Eyes:     General: No scleral icterus.       Right eye: No discharge.        Left eye: No discharge.     Extraocular Movements: Extraocular movements intact.     Conjunctiva/sclera: Conjunctivae normal.     Pupils: Pupils are equal, round, and reactive to light.  Cardiovascular:     Rate and Rhythm: Normal rate and regular rhythm.     Heart sounds: Normal heart sounds. No murmur heard.  No friction rub. No gallop.   Pulmonary:     Effort: Pulmonary effort is normal. No respiratory distress.     Breath sounds: Normal breath sounds. No stridor. No wheezing, rhonchi or rales.  Neurological:     Mental Status: He is alert and oriented to person, place, and time.     Cranial Nerves: No cranial nerve deficit.     Motor: No  weakness.     Coordination: Coordination normal.     Gait: Gait normal.     Deep Tendon Reflexes: Reflexes normal.  Psychiatric:        Mood and Affect: Mood normal.        Behavior: Behavior normal.        Thought Content: Thought content normal.        Judgment: Judgment normal.     Assessment and Plan :   PDMP not reviewed this encounter.  1. Essential hypertension   2. Encounter for medication refill     No signs of an acute process including acute on chronic heart failure, stroke, ACS.  Refilled medications and will have patient start losartan in 2 days.  Contact [facility] to establish care with new PCP. Counseled patient on potential for adverse effects with medications prescribed/recommended today, strict ER and return-to-clinic precautions discussed, patient verbalized understanding.    Wallis Bamberg, [place] 09/02/19 [phone]

## 2019-12-22 ENCOUNTER — Other Ambulatory Visit: Payer: Self-pay

## 2019-12-22 ENCOUNTER — Encounter (HOSPITAL_COMMUNITY): Payer: Self-pay

## 2019-12-22 ENCOUNTER — Ambulatory Visit (HOSPITAL_COMMUNITY)
Admission: EM | Admit: 2019-12-22 | Discharge: 2019-12-22 | Disposition: A | Discharge status: Home / Self Care | Payer: Self-pay | Attending: Internal Medicine | Admitting: Internal Medicine

## 2019-12-22 DIAGNOSIS — Z760 Encounter for issue of repeat prescription: Secondary | ICD-10-CM

## 2019-12-22 MED ORDER — ATORVASTATIN CALCIUM 40 MG PO TABS: 40.0000 mg | ORAL_TABLET | Freq: Every day | ORAL | 1 refills | Status: DC

## 2019-12-22 MED ORDER — METOPROLOL TARTRATE 100 MG PO TABS
100.0000 mg | ORAL_TABLET | Freq: Two times a day (BID) | ORAL | 1 refills | Status: DC
Start: 2019-12-22 — End: 2020-06-22

## 2019-12-22 MED ORDER — HYDROCHLOROTHIAZIDE 12.5 MG PO TABS: 12.5000 mg | ORAL_TABLET | ORAL | 1 refills | Status: DC

## 2019-12-22 MED ORDER — LOSARTAN POTASSIUM 50 MG PO TABS: 50.0000 mg | ORAL_TABLET | Freq: Every day | ORAL | 1 refills | Status: DC

## 2019-12-22 NOTE — Discharge Instructions (Addendum)
Please take medications as prescribed I will give you information to set up an appointment with a primary care provider The primary care provider is very beneficial because they provide preventative care and also follow-up closely with your progress/any referrals needed.

## 2019-12-22 NOTE — ED Provider Notes (Signed)
[facility]    CSN: [id number] Arrival date & time: 12/22/19  1001      History   Chief Complaint Chief Complaint  Patient presents with  . Medication Refill    HPI [name] is a 54 y.o. male with a history of hypertension and hyperlipidemia comes to urgent care for medication refills.  Patient has no primary care physician at this time and is unable to get an appointment to see a primary care physician.  Patient denies any headache, chest pain, chest pressure, shortness of breath or abdominal pain.   He works out on a treadmill 5 times a week  HPI  Past Medical History:  Diagnosis Date  . Chronic combined systolic and diastolic CHF (congestive heart failure) (HCC)    EF 25-30%  . Diastolic dysfunction   . Dyslipidemia   . Hyperlipidemia   . Hypertension   . Nonischemic dilated cardiomyopathy (HCC)    felt secondary to HTN with no ischemia on nuclear stress test 05/2012, EF 25-30%  . Shortness of breath     Patient Active Problem List   Diagnosis Date Noted  . Hyperlipidemia, on Lipitor 04/01/2017  . Alcohol use disorder, mild, abuse 06/30/2016  . Morbid obesity (HCC) 06/30/2016  . 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 HTN (hypertension) 05/25/2012    Past Surgical History:  Procedure Laterality Date  . KNEE ARTHROSCOPY Left 04/13/2016   Procedure: ARTHROSCOPY KNEE WITH DEBRIDEMENT;  Surgeon: Valeria Batman, MD;  Location: [facility];  Service: Orthopedics;  Laterality: Left;  . KNEE ARTHROSCOPY WITH MEDIAL MENISECTOMY Right 04/13/2016   Procedure: KNEE ARTHROSCOPY WITH MEDIAL MENISECTOMY;  Surgeon: Valeria Batman, MD;  Location: [facility];  Service: Orthopedics;  Laterality: Right;  . NASAL FRACTURE SURGERY         Home Medications    Prior to Admission medications   Medication Sig Start Date End  Date Taking? Authorizing Provider  atorvastatin (LIPITOR) 40 MG tablet Take 1 tablet (40 mg total) by mouth daily at 6 PM. 12/22/19   Orrie Lascano, Britta Mccreedy, MD  hydrochlorothiazide (HYDRODIURIL) 12.5 MG tablet Take 1 tablet (12.5 mg total) by mouth every other day. 12/22/19   Merrilee Jansky, MD  losartan (COZAAR) 50 MG tablet Take 1 tablet (50 mg total) by mouth daily. 12/22/19   Gurveer Colucci, Britta Mccreedy, MD  metoprolol tartrate (LOPRESSOR) 100 MG tablet Take 1 tablet (100 mg total) by mouth 2 (two) times daily. 12/22/19   LampteyBritta Mccreedy, MD  Multiple Vitamin (MULTIVITAMIN WITH MINERALS) TABS tablet Take 1 tablet by mouth daily.    [provider]    Family History Family History  Problem Relation Age of Onset  . Hypertension Mother        Passed away when she is 80 secondary to hip surgery complications  . CAD Mother   . Heart attack Mother   . Hypertension Father   . Heart attack Maternal Grandmother     Social History Social History   Tobacco Use  . Smoking status: Never Smoker  . Smokeless tobacco: Never Used  Vaping Use  . Vaping Use: Never used  Substance Use Topics  . Alcohol use: Yes    Alcohol/week: 24.0 standard drinks    Types: 24 Cans of beer per week    Comment: 2 beers per day  . Drug use: No  Allergies   Patient has no known allergies.   Review of Systems Review of Systems  Constitutional: Negative.   Respiratory: Negative.   Cardiovascular: Negative.   Gastrointestinal: Negative.   Genitourinary: Negative.   Neurological: Negative.      Physical Exam Triage Vital Signs ED Triage Vitals  Enc Vitals Group     BP 12/22/19 1023 (!) 168/114     Pulse Rate 12/22/19 1023 95     Resp 12/22/19 1023 20     Temp 12/22/19 1023 98.2 F (36.8 C)     Temp Source 12/22/19 1023 Oral     SpO2 12/22/19 1023 96 %     Weight --      Height --      Head Circumference --      Peak Flow --      Pain Score 12/22/19 1022 0     Pain Loc --      Pain  Edu? --      Excl. in GC? --    No data found.  Updated Vital Signs BP (!) 168/114 (BP Location: Right Arm)   Pulse 95   Temp 98.2 F (36.8 C) (Oral)   Resp 20   SpO2 96%   Visual Acuity Right Eye Distance:   Left Eye Distance:   Bilateral Distance:    Right Eye Near:   Left Eye Near:    Bilateral Near:     Physical Exam Vitals and nursing note reviewed.  Constitutional:      Appearance: Normal appearance.  Cardiovascular:     Rate and Rhythm: Normal rate and regular rhythm.     Pulses: Normal pulses.     Heart sounds: Normal heart sounds.  Pulmonary:     Effort: Pulmonary effort is normal.     Breath sounds: Normal breath sounds.  Abdominal:     General: Bowel sounds are normal.     Palpations: Abdomen is soft.  Musculoskeletal:        General: Normal range of motion.     Cervical back: Normal range of motion and neck supple.  Skin:    General: Skin is warm and dry.     Capillary Refill: Capillary refill takes less than 2 seconds.  Neurological:     Mental Status: He is alert.      UC Treatments / Results  Labs (all labs ordered are listed, but only abnormal results are displayed) Labs Reviewed - No data to display  EKG   Radiology No results found.  Procedures Procedures (including critical care time)  Medications Ordered in UC Medications - No data to display  Initial Impression / Assessment and Plan / UC Course  I have reviewed the triage vital signs and the nursing notes.  Pertinent labs & imaging results that were available during my care of the patient were reviewed by me and considered in my medical decision making (see chart for details).    1.  Uncontrolled hypertension: Medications refilled Patient is advised to call the [facility] health internal medicine clinic to get an appointment  2.  Hyperlipidemia: Atorvastatin refilled Return precautions given. Final Clinical Impressions(s) / UC Diagnoses   Final diagnoses:  Medication  refill     Discharge Instructions     Please take medications as prescribed I will give you information to set up an appointment with a primary care provider The primary care provider is very beneficial because they provide preventative care and also follow-up closely with your progress/any referrals  needed.   ED Prescriptions    Medication Sig Dispense Auth. Provider   atorvastatin (LIPITOR) 40 MG tablet Take 1 tablet (40 mg total) by mouth daily at 6 PM. 90 tablet Glendale Youngblood, Britta Mccreedy, MD   hydrochlorothiazide (HYDRODIURIL) 12.5 MG tablet Take 1 tablet (12.5 mg total) by mouth every other day. 90 tablet Lotoya Casella, Britta Mccreedy, MD   losartan (COZAAR) 50 MG tablet Take 1 tablet (50 mg total) by mouth daily. 90 tablet Zaviyar Rahal, Britta Mccreedy, MD   metoprolol tartrate (LOPRESSOR) 100 MG tablet Take 1 tablet (100 mg total) by mouth 2 (two) times daily. 180 tablet Jaeleen Inzunza, Britta Mccreedy, MD     PDMP not reviewed this encounter.   Merrilee Jansky, MD 12/22/19 1134

## 2019-12-22 NOTE — ED Triage Notes (Signed)
Pt presents for refill on blood pressure medication; does not have PCP

## 2020-01-06 ENCOUNTER — Ambulatory Visit (HOSPITAL_COMMUNITY)
Admission: EM | Admit: 2020-01-06 | Discharge: 2020-01-06 | Disposition: A | Discharge status: Home / Self Care | Payer: Self-pay | Attending: Family Medicine | Admitting: Family Medicine

## 2020-01-06 ENCOUNTER — Encounter (HOSPITAL_COMMUNITY): Payer: Self-pay

## 2020-01-06 ENCOUNTER — Ambulatory Visit (INDEPENDENT_AMBULATORY_CARE_PROVIDER_SITE_OTHER): Payer: Self-pay

## 2020-01-06 ENCOUNTER — Other Ambulatory Visit: Payer: Self-pay

## 2020-01-06 DIAGNOSIS — M25572 Pain in left ankle and joints of left foot: Secondary | ICD-10-CM

## 2020-01-06 DIAGNOSIS — R2242 Localized swelling, mass and lump, left lower limb: Secondary | ICD-10-CM

## 2020-01-06 DIAGNOSIS — S93492A Sprain of other ligament of left ankle, initial encounter: Secondary | ICD-10-CM

## 2020-01-06 DIAGNOSIS — X509XXA Other and unspecified overexertion or strenuous movements or postures, initial encounter: Secondary | ICD-10-CM

## 2020-01-06 DIAGNOSIS — I10 Essential (primary) hypertension: Secondary | ICD-10-CM

## 2020-01-06 NOTE — ED Triage Notes (Signed)
Pt in with c/o left ankle swelling and pain that occurred after he stepped in ditch in his neighbors yard and twisted his ankle.  Pt limping into triage room  Pt took ibuprofen with some relief  States that he has noticed some swelling.

## 2020-01-06 NOTE — ED Provider Notes (Signed)
[facility]   [id number] 01/06/20 Arrival Time: 1518  ASSESSMENT & PLAN:  1. Acute left ankle pain     I have personally viewed the imaging studies ordered this visit. No fractures appreciated.  See AVS for d/c instructions. WBAT.  Orders Placed This Encounter  Procedures  . DG Ankle Complete Left    Recommend:    Follow-up Information    [facility].   Why: If worsening or failing to improve as anticipated. Contact information: [address] [place] [address] [phone]               Reviewed expectations re: course of current medical issues. Questions answered. Outlined signs and symptoms indicating need for more acute intervention. Patient verbalized understanding. After Visit Summary given.  SUBJECTIVE: History from: patient. [name] is a 54 y.o. male who reports fairly persistent moderate pain of his left lateral ankle; described as aching; without radiation. Onset: abrupt. First noted: few d ago. Injury/trama: twisted in yard.. Symptoms have progressed to a point and plateaued since beginning. Aggravating factors: certain movements. Alleviating factors: have not been identified. Associated symptoms: none reported. Extremity sensation changes or weakness: none. Self treatment: has not tried OTC therapies.  History of similar: no.  Past Surgical History:  Procedure Laterality Date  . KNEE ARTHROSCOPY Left 04/13/2016   Procedure: ARTHROSCOPY KNEE WITH DEBRIDEMENT;  Surgeon: Valeria Batman, MD;  Location: [facility];  Service: Orthopedics;  Laterality: Left;  . KNEE ARTHROSCOPY WITH MEDIAL MENISECTOMY Right 04/13/2016   Procedure: KNEE ARTHROSCOPY WITH MEDIAL MENISECTOMY;  Surgeon: Valeria Batman, MD;  Location: [facility];  Service: Orthopedics;  Laterality: Right;  . NASAL FRACTURE SURGERY      Increased blood pressure noted today. Reports that  he is treated for HTN.  He reports no chest pain on exertion, no dyspnea on exertion, no swelling of ankles, no orthostatic dizziness or lightheadedness, no orthopnea or paroxysmal nocturnal dyspnea, no palpitations and no intermittent claudication symptoms.   OBJECTIVE:  Vitals:   01/06/20 1642  BP: (!) 175/90  Pulse: 90  Resp: 20  Temp: 97.9 F (36.6 C)  TempSrc: Oral  SpO2: 98%    General appearance: alert; no distress HEENT: [place]; AT Neck: supple with FROM Resp: unlabored respirations Extremities: . LLE: warm with well perfused appearance; fairly well localized moderate tenderness over left ankle, ATFL distribution; without gross deformities; swelling: minimal; bruising: none; ankle ROM: normal, with discomfort CV: brisk extremity capillary refill of LLE; 2+ DP pulse of LLE. Skin: warm and dry; no visible rashes Neurologic: gait normal but favors L; normal sensation and strength of LLE Psychological: alert and cooperative; normal mood and affect  Imaging: DG Ankle Complete Left  Result Date: 01/06/2020 CLINICAL DATA:  Left ankle pain for 3 days after stepping in a ditch and twisting. Swelling. EXAM: LEFT ANKLE COMPLETE - 3+ VIEW COMPARISON:  None. FINDINGS: There is no evidence of fracture, dislocation, or joint effusion. Ankle mortise is preserved. Enthesopathic changes noted at the medial malleolus. Tiny plantar calcaneal spur and Achilles tendon enthesophyte. Mild generalized soft tissue edema. IMPRESSION: Soft tissue edema. No acute fracture or subluxation. Electronically Signed   By: Narda Rutherford M.D.   On: 01/06/2020 17:52      No Known Allergies  Past Medical History:  Diagnosis Date  . Chronic combined systolic and diastolic CHF (congestive heart failure) (HCC)    EF 25-30%  . Diastolic  dysfunction   . Dyslipidemia   . Hyperlipidemia   . Hypertension   . Nonischemic dilated cardiomyopathy (HCC)    felt secondary to HTN with no ischemia on nuclear stress  test 05/2012, EF 25-30%  . Shortness of breath    Social History   Socioeconomic History  . Marital status: Married    Spouse name: Not on file  . Number of children: Not on file  . Years of education: Not on file  . Highest education level: Not on file  Occupational History  . Not on file  Tobacco Use  . Smoking status: Never Smoker  . Smokeless tobacco: Never Used  Vaping Use  . Vaping Use: Never used  Substance and Sexual Activity  . Alcohol use: Yes    Alcohol/week: 24.0 standard drinks    Types: 24 Cans of beer per week    Comment: 2 beers per day  . Drug use: No  . Sexual activity: Yes  Other Topics Concern  . Not on file  Social History Narrative  . Not on file   Social Determinants of Health   Financial Resource Strain: Not on file  Food Insecurity: Not on file  Transportation Needs: Not on file  Physical Activity: Not on file  Stress: Not on file  Social Connections: Not on file   Family History  Problem Relation Age of Onset  . Hypertension Mother        Passed away when she is 78 secondary to hip surgery complications  . CAD Mother   . Heart attack Mother   . Hypertension Father   . Heart attack Maternal Grandmother    Past Surgical History:  Procedure Laterality Date  . KNEE ARTHROSCOPY Left 04/13/2016   Procedure: ARTHROSCOPY KNEE WITH DEBRIDEMENT;  Surgeon: Valeria Batman, MD;  Location: [facility];  Service: Orthopedics;  Laterality: Left;  . KNEE ARTHROSCOPY WITH MEDIAL MENISECTOMY Right 04/13/2016   Procedure: KNEE ARTHROSCOPY WITH MEDIAL MENISECTOMY;  Surgeon: Valeria Batman, MD;  Location: [facility];  Service: Orthopedics;  Laterality: Right;  . NASAL FRACTURE SURGERY        Mardella Layman, MD 01/07/20 [phone]

## 2020-01-06 NOTE — Discharge Instructions (Addendum)
Your blood pressure was noted to be elevated during your visit today. If you are currently taking medication for high blood pressure, please ensure you are taking this as directed. If you do not have a history of high blood pressure and your blood pressure remains persistently elevated, you may need to begin taking a medication at some point. You may return here within the next few days to recheck if unable to see your primary care provider or if do not have a one.  BP (!) 175/90 (BP Location: Right Arm)   Pulse 90   Temp 97.9 F (36.6 C) (Oral)   Resp 20   SpO2 98%   If not allergic, you may use over the counter ibuprofen or acetaminophen as needed.

## 2020-01-13 ENCOUNTER — Other Ambulatory Visit: Payer: Self-pay

## 2020-01-13 ENCOUNTER — Ambulatory Visit (INDEPENDENT_AMBULATORY_CARE_PROVIDER_SITE_OTHER): Payer: Self-pay | Admitting: Sports Medicine

## 2020-01-13 VITALS — BP 192/101 | Ht 70.0 in | Wt 239.0 lb

## 2020-01-13 DIAGNOSIS — S93492A Sprain of other ligament of left ankle, initial encounter: Secondary | ICD-10-CM

## 2020-01-13 NOTE — Progress Notes (Signed)
   Subjective:    Patient ID: [name], male    DOB: November 22, 1965, 54 y.o.   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  While working in his neighbors yard, he stepped in a hole suffering an inversion injury to the ankle.  Developed pain and swelling that led him to seek treatment at urgent care.  He was diagnosed with an ankle sprain after x-rays of his ankle were unremarkable other than some soft tissue swelling.  He was placed into a med spec brace and he has been elevating his foot and ankle is much as possible.  He has also been taking over-the-counter ibuprofen.  His pain is improving but has not resolved.  He localizes pain to the lateral ankle as well as to the heel.  No tenderness medially.  Denies pain in the foot.  He did suffer a previous ankle sprain several years ago.  His employer does not have light duty so he has been out of work since his injury.  Past medical history reviewed Medications reviewed Allergies reviewed    Review of Systems As above    Objective:   Physical Exam  Well-developed, well-nourished.  No acute distress.  Awake alert and oriented x3.  Vital signs reviewed  Left ankle: Patient has fairly good active and passive ankle range of motion.  No soft tissue swelling.  No effusion.  No ecchymosis.  He is tender to palpation along the ATF and calcaneofibular ligament.  Negative anterior drawer, negative talar tilt.  No tenderness over the medial malleolus nor at the base of the fifth metatarsal.  No tenderness at the navicular.  He does have some pain with calcaneal squeeze.  Good pulses.  Neurovascularly intact distally.  X-rays of his left ankle including AP, lateral, and mortise views show no obvious fracture.      Assessment & Plan:   Left ankle pain likely secondary to ankle sprain and calcaneal contusion  Patient  will continue with his meds back brace when on uneven ground.  Otherwise, he will start a home exercise program and will follow up with me again in 2 weeks.  Given the nature of his job and lack of light duty, he will remain out of work until that follow-up visit.  Call with questions or concerns in the interim.

## 2020-01-15 ENCOUNTER — Ambulatory Visit (INDEPENDENT_AMBULATORY_CARE_PROVIDER_SITE_OTHER): Payer: Self-pay | Admitting: Family Medicine

## 2020-01-15 ENCOUNTER — Encounter: Payer: Self-pay | Admitting: Family Medicine

## 2020-01-15 ENCOUNTER — Other Ambulatory Visit: Payer: Self-pay

## 2020-01-15 VITALS — BP 167/99 | Ht 70.0 in | Wt 239.0 lb

## 2020-01-15 DIAGNOSIS — S93492A Sprain of other ligament of left ankle, initial encounter: Secondary | ICD-10-CM

## 2020-01-15 NOTE — Patient Instructions (Signed)
Nice to meet you Please use ice as needed  Please use the brace until your stability and strength has returned  Please continue the exercises  Please send me a message in MyChart with any questions or updates.  Please see me back in 4 weeks or as needed if better.   --Dr. Jordan Likes

## 2020-01-15 NOTE — Assessment & Plan Note (Signed)
Injury occurred earlier this month. Has noticed improvement with exercises and brace.  -Counseled on home exercise therapy and supportive care. -Counseled on using a lace up ankle brace. -Provided work note.

## 2020-01-15 NOTE — Progress Notes (Signed)
[name] - 54 y.o. male MRN [id number]  Date of birth: August 20, 1965  SUBJECTIVE:  Including CC & ROS.  No chief complaint on file.   [name] is a 54 y.o. male that is following up for his left ankle pain.  He feels like his left ankle is doing well and he can get back to his normal work.  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  He does not include any lifting at this time.   Review of Systems See HPI   HISTORY: Past Medical, Surgical, Social, and Family History Reviewed & Updated per EMR.   Pertinent Historical Findings include:  Past Medical History:  Diagnosis Date  . Chronic combined systolic and diastolic CHF (congestive heart failure) (HCC)    EF 25-30%  . Diastolic dysfunction   . Dyslipidemia   . Hyperlipidemia   . Hypertension   . Nonischemic dilated cardiomyopathy (HCC)    felt secondary to HTN with no ischemia on nuclear stress test 05/2012, EF 25-30%  . Shortness of breath     Past Surgical History:  Procedure Laterality Date  . KNEE ARTHROSCOPY Left 04/13/2016   Procedure: ARTHROSCOPY KNEE WITH DEBRIDEMENT;  Surgeon: Valeria Batman, MD;  Location: [facility];  Service: Orthopedics;  Laterality: Left;  . KNEE ARTHROSCOPY WITH MEDIAL MENISECTOMY Right 04/13/2016   Procedure: KNEE ARTHROSCOPY WITH MEDIAL MENISECTOMY;  Surgeon: Valeria Batman, MD;  Location: [facility];  Service: Orthopedics;  Laterality: Right;  . NASAL FRACTURE SURGERY      Family History  Problem Relation Age of Onset  . Hypertension Mother        Passed away when she is 66 secondary to hip surgery complications  . CAD Mother   . Heart attack Mother   . Hypertension Father   . Heart attack Maternal Grandmother     Social History   Socioeconomic History  . Marital status: Married    Spouse name: Not on file  . Number of children: Not on file  . Years of education: Not on file  . Highest education level: Not on file  Occupational  History  . Not on file  Tobacco Use  . Smoking status: Never Smoker  . Smokeless tobacco: Never Used  Vaping Use  . Vaping Use: Never used  Substance and Sexual Activity  . Alcohol use: Yes    Alcohol/week: 24.0 standard drinks    Types: 24 Cans of beer per week    Comment: 2 beers per day  . Drug use: No  . Sexual activity: Yes  Other Topics Concern  . Not on file  Social History Narrative  . Not on file   Social Determinants of Health   Financial Resource Strain: Not on file  Food Insecurity: Not on file  Transportation Needs: Not on file  Physical Activity: Not on file  Stress: Not on file  Social Connections: Not on file  Intimate Partner Violence: Not on file     PHYSICAL EXAM:  VS: BP (!) 167/99   Ht 5\' 10"  (1.778 m)   Wt 239 lb (108.4 kg)   BMI 34.29 kg/m  Physical Exam Gen: NAD, alert, cooperative with exam, well-appearing    ASSESSMENT & PLAN:   Sprain of anterior talofibular ligament of left ankle Injury occurred earlier this month. Has noticed improvement with exercises and brace.  -Counseled on home exercise therapy and supportive care. -Counseled on using a lace up ankle brace. -Provided  work note.

## 2020-01-26 ENCOUNTER — Ambulatory Visit: Payer: Self-pay | Admitting: Family Medicine

## 2020-01-26 ENCOUNTER — Ambulatory Visit (INDEPENDENT_AMBULATORY_CARE_PROVIDER_SITE_OTHER): Payer: Self-pay | Admitting: Family Medicine

## 2020-01-26 ENCOUNTER — Other Ambulatory Visit: Payer: Self-pay

## 2020-01-26 ENCOUNTER — Encounter: Payer: Self-pay | Admitting: Family Medicine

## 2020-01-26 VITALS — BP 170/109 | Ht 70.0 in | Wt 239.0 lb

## 2020-01-26 DIAGNOSIS — S93492D Sprain of other ligament of left ankle, subsequent encounter: Secondary | ICD-10-CM

## 2020-01-26 NOTE — Progress Notes (Signed)
PCP: Patient, No Pcp Per  Subjective:   HPI: Patient is a 55 y.o. male here for left ankle injury.  Patient reports he's doing very well. No longer using boot. Doing home exercises, wearing compression socks and ankle brace. Wants to go back to work without restrictions.  Past Medical History:  Diagnosis Date  . Chronic combined systolic and diastolic CHF (congestive heart failure) (HCC)    EF 25-30%  . Diastolic dysfunction   . Dyslipidemia   . Hyperlipidemia   . Hypertension   . Nonischemic dilated cardiomyopathy (HCC)    felt secondary to HTN with no ischemia on nuclear stress test 05/2012, EF 25-30%  . Shortness of breath     Current Outpatient Medications on File Prior to Visit  Medication Sig Dispense Refill  . atorvastatin (LIPITOR) 40 MG tablet Take 1 tablet (40 mg total) by mouth daily at 6 PM. 90 tablet 1  . hydrochlorothiazide (HYDRODIURIL) 12.5 MG tablet Take 1 tablet (12.5 mg total) by mouth every other day. 90 tablet 1  . losartan (COZAAR) 50 MG tablet Take 1 tablet (50 mg total) by mouth daily. 90 tablet 1  . metoprolol tartrate (LOPRESSOR) 100 MG tablet Take 1 tablet (100 mg total) by mouth 2 (two) times daily. 180 tablet 1  . Multiple Vitamin (MULTIVITAMIN WITH MINERALS) TABS tablet Take 1 tablet by mouth daily.     No current facility-administered medications on file prior to visit.    Past Surgical History:  Procedure Laterality Date  . KNEE ARTHROSCOPY Left 04/13/2016   Procedure: ARTHROSCOPY KNEE WITH DEBRIDEMENT;  Surgeon: Valeria Batman, MD;  Location: [facility];  Service: Orthopedics;  Laterality: Left;  . KNEE ARTHROSCOPY WITH MEDIAL MENISECTOMY Right 04/13/2016   Procedure: KNEE ARTHROSCOPY WITH MEDIAL MENISECTOMY;  Surgeon: Valeria Batman, MD;  Location:  [facility]ERY [facility];  Service: Orthopedics;  Laterality: Right;  . NASAL FRACTURE SURGERY      No Known Allergies  Social History   Socioeconomic History  . Marital  status: Married    Spouse name: Not on file  . Number of children: Not on file  . Years of education: Not on file  . Highest education level: Not on file  Occupational History  . Not on file  Tobacco Use  . Smoking status: Never Smoker  . Smokeless tobacco: Never Used  Vaping Use  . Vaping Use: Never used  Substance and Sexual Activity  . Alcohol use: Yes    Alcohol/week: 24.0 standard drinks    Types: 24 Cans of beer per week    Comment: 2 beers per day  . Drug use: No  . Sexual activity: Yes  Other Topics Concern  . Not on file  Social History Narrative  . Not on file   Social Determinants of Health   Financial Resource Strain: Not on file  Food Insecurity: Not on file  Transportation Needs: Not on file  Physical Activity: Not on file  Stress: Not on file  Social Connections: Not on file  Intimate Partner Violence: Not on file    Family History  Problem Relation Age of Onset  . Hypertension Mother        Passed away when she is 70 secondary to hip surgery complications  . CAD Mother   . Heart attack Mother   . Hypertension Father   . Heart attack Maternal Grandmother     BP (!) 170/109   Ht 5\' 10"  (1.778 m)   Wt 239  lb (108.4 kg)   BMI 34.29 kg/m   No flowsheet data found.  No flowsheet data found.  Review of Systems: See HPI above.     Objective:  Physical Exam:  Gen: NAD, comfortable in exam room  Left ankle/foot: No gross deformity, swelling, ecchymoses FROM No TTP. Negative ant drawer and negative talar tilt.   Negative syndesmotic compression. Thompsons test negative. NV intact distally.   Assessment & Plan:  1. Left ankle injury - 2/2 lateral ankle sprain.  Doing well, return without restrictions.  Continue ankle brace, home exercises for 2 more weeks.  F/u prn.

## 2020-01-26 NOTE — Patient Instructions (Addendum)
You're doing great! Continue wearing the ankle brace and doing home exercises for 2 more weeks. Return to full duty. Call us if you have any issues otherwise follow up as needed.

## 2020-01-27 ENCOUNTER — Ambulatory Visit: Payer: Self-pay | Admitting: Sports Medicine

## 2020-01-31 ENCOUNTER — Other Ambulatory Visit: Payer: Self-pay

## 2020-02-17 ENCOUNTER — Ambulatory Visit (INDEPENDENT_AMBULATORY_CARE_PROVIDER_SITE_OTHER): Payer: Self-pay | Admitting: Sports Medicine

## 2020-02-17 ENCOUNTER — Other Ambulatory Visit: Payer: Self-pay

## 2020-02-17 VITALS — BP 159/88 | Ht 70.0 in | Wt 239.0 lb

## 2020-02-17 DIAGNOSIS — M7660 Achilles tendinitis, unspecified leg: Secondary | ICD-10-CM

## 2020-02-17 MED ORDER — PREDNISONE 10 MG PO TABS: ORAL_TABLET | ORAL | 0 refills | Status: DC

## 2020-02-17 NOTE — Progress Notes (Signed)
   PCP: Patient, No Pcp Per  Subjective:   HPI: Patient is a 55 y.o. male here for follow-up on ankle sprain.  Initial injury on 01/06/2020, x-rays did not show any fracture.  He is managed with lace up ankle brace and relative rest, at most recent visit on 01/26/2020 he was doing well and was cleared to return back to work. He complains of posterior left heel pain at the Achilles tendon which has been present for about 4 days. No new injury. He is getting similar pain in the right Achilles as well. He has been taking some acetaminophen which has been helpful. He has also been soaking his foot in cold water which has been helpful.   Review of Systems:  Per HPI.   PMFSH, medications and smoking status reviewed.      Objective:  Physical Exam:  Well-developed, well-nourished. No acute distress  Left heel: There is tenderness to palpation along the Achilles tendon. Mild swelling as well. Good plantar flexion with Thompson's testing. Good strength. No other bony or soft tissue tenderness throughout the foot. Walking with a slight limp.   Assessment & Plan:  Achilles tendinitis  6-day Sterapred Dosepak as directed. I do not see any contraindications to using this medication. He may continue with his acetaminophen as well. We will give him 5/16 inch heel lifts to wear in his shoes. He has a walking boot from his previous ankle injury and he may need to wear that occasionally if he is limping badly. Hopefully this will resolve quickly and he may follow-up with [place] as needed.   Guy Sandifer, MD [facility] 02/17/2020 9:10 AM

## 2020-02-17 NOTE — Patient Instructions (Signed)
  I'm going to put you on 6 days worth of prednisone. I'm also going to give you some heel lifts to put in your shoes. You may need to wear your walking boot with a heel lift in it occasionally if you are limping badly. You may continue with your acetaminophen You may also continue soaking in cold water This should resolve rather quickly. If not, you know where to find me.  Dr. Margaretha Sheffield

## 2020-04-14 ENCOUNTER — Other Ambulatory Visit: Payer: Self-pay | Admitting: Sports Medicine

## 2020-05-23 ENCOUNTER — Other Ambulatory Visit: Payer: Self-pay | Admitting: Sports Medicine

## 2020-05-23 DIAGNOSIS — I639 Cerebral infarction, unspecified: Secondary | ICD-10-CM

## 2020-05-23 HISTORY — DX: Cerebral infarction, unspecified: I63.9

## 2020-05-24 ENCOUNTER — Other Ambulatory Visit: Payer: Self-pay

## 2020-05-24 ENCOUNTER — Ambulatory Visit (INDEPENDENT_AMBULATORY_CARE_PROVIDER_SITE_OTHER): Payer: Self-pay | Admitting: Family Medicine

## 2020-05-24 VITALS — BP 140/70 | Ht 70.0 in | Wt 241.0 lb

## 2020-05-24 DIAGNOSIS — M25571 Pain in right ankle and joints of right foot: Secondary | ICD-10-CM

## 2020-05-24 MED ORDER — PREDNISONE 10 MG PO TABS: ORAL_TABLET | ORAL | 0 refills | Status: DC

## 2020-05-24 NOTE — Patient Instructions (Signed)
Establish care with a family doctor - you can call the [facility] at [phone] to establish with a doctor (it's up the street at [address]). Take the prednisone dose pack for presumed gout flare. I would recommend checking the uric acid level when you establish with a family doctor and considering preventive medicine in the future. Icing 15 minutes at a time as needed. Call me in a week if you're not improving as expected.

## 2020-05-25 ENCOUNTER — Encounter: Payer: Self-pay | Admitting: Family Medicine

## 2020-05-25 NOTE — Progress Notes (Signed)
PCP: Patient, No Pcp Per (Inactive)  Subjective:   HPI: Patient is a 55 y.o. male here for right ankle swelling.  Patient denies known injury or trauma. He did very well after last visit in January with prednisone dose pack. Has known history of gout. Current issue started on Saturday. Swelling anterior right ankle with difficulty bearing weight. No injury.  Past Medical History:  Diagnosis Date  . Chronic combined systolic and diastolic CHF (congestive heart failure) (HCC)    EF 25-30%  . Diastolic dysfunction   . Dyslipidemia   . Hyperlipidemia   . Hypertension   . Nonischemic dilated cardiomyopathy (HCC)    felt secondary to HTN with no ischemia on nuclear stress test 05/2012, EF 25-30%  . Shortness of breath     Current Outpatient Medications on File Prior to Visit  Medication Sig Dispense Refill  . atorvastatin (LIPITOR) 40 MG tablet Take 1 tablet (40 mg total) by mouth daily at 6 PM. 90 tablet 1  . hydrochlorothiazide (HYDRODIURIL) 12.5 MG tablet Take 1 tablet (12.5 mg total) by mouth every other day. 90 tablet 1  . losartan (COZAAR) 50 MG tablet Take 1 tablet (50 mg total) by mouth daily. 90 tablet 1  . metoprolol tartrate (LOPRESSOR) 100 MG tablet Take 1 tablet (100 mg total) by mouth 2 (two) times daily. 180 tablet 1  . Multiple Vitamin (MULTIVITAMIN WITH MINERALS) TABS tablet Take 1 tablet by mouth daily.     No current facility-administered medications on file prior to visit.    Past Surgical History:  Procedure Laterality Date  . KNEE ARTHROSCOPY Left 04/13/2016   Procedure: ARTHROSCOPY KNEE WITH DEBRIDEMENT;  Surgeon: Valeria Batman, MD;  Location: [facility];  Service: Orthopedics;  Laterality: Left;  . KNEE ARTHROSCOPY WITH MEDIAL MENISECTOMY Right 04/13/2016   Procedure: KNEE ARTHROSCOPY WITH MEDIAL MENISECTOMY;  Surgeon: Valeria Batman, MD;  Location: [facility];  Service: Orthopedics;  Laterality: Right;  . NASAL FRACTURE  SURGERY      No Known Allergies  Social History   Socioeconomic History  . Marital status: Married    Spouse name: Not on file  . Number of children: Not on file  . Years of education: Not on file  . Highest education level: Not on file  Occupational History  . Not on file  Tobacco Use  . Smoking status: Never Smoker  . Smokeless tobacco: Never Used  Vaping Use  . Vaping Use: Never used  Substance and Sexual Activity  . Alcohol use: Yes    Alcohol/week: 24.0 standard drinks    Types: 24 Cans of beer per week    Comment: 2 beers per day  . Drug use: No  . Sexual activity: Yes  Other Topics Concern  . Not on file  Social History Narrative  . Not on file   Social Determinants of Health   Financial Resource Strain: Not on file  Food Insecurity: Not on file  Transportation Needs: Not on file  Physical Activity: Not on file  Stress: Not on file  Social Connections: Not on file  Intimate Partner Violence: Not on file    Family History  Problem Relation Age of Onset  . Hypertension Mother        Passed away when she is 92 secondary to hip surgery complications  . CAD Mother   . Heart attack Mother   . Hypertension Father   . Heart attack Maternal Grandmother     BP  140/70   Ht 5\' 10"  (1.778 m)   Wt 241 lb (109.3 kg)   BMI 34.58 kg/m   No flowsheet data found.  No flowsheet data found.  Review of Systems: See HPI above.     Objective:  Physical Exam:  Gen: NAD, comfortable in exam room  Right foot/ankle: Mild swelling anterior ankle.  No pitting edema.  No other deformity. Mild limitation motion all directions. TTP anterior ankle joint, over ATFL.  No other tenderness. Negative ant drawer and negative talar tilt.   Negative syndesmotic compression. Thompsons test negative. NV intact distally.  Limited msk u/s right ankle:  ATFL intact.  Mild ankle effusion.  No obvious crystals seen.   Assessment & Plan:  1. Right ankle pain - with swelling.   Onset without injury and severe enough to cause difficulty walking.  Concerning for early gout flare.  Start prednisone dose pack.  Icing.  He was given information to establish with a PCP - would recommend checking his uric acid and considering preventative medication as well.  Let know how he's doing in 1 week.

## 2020-06-09 ENCOUNTER — Inpatient Hospital Stay (HOSPITAL_COMMUNITY): Payer: 59

## 2020-06-09 ENCOUNTER — Emergency Department (HOSPITAL_COMMUNITY): Payer: 59

## 2020-06-09 ENCOUNTER — Encounter (HOSPITAL_COMMUNITY): Payer: Self-pay | Admitting: Student in an Organized Health Care Education/Training Program

## 2020-06-09 ENCOUNTER — Inpatient Hospital Stay (HOSPITAL_COMMUNITY)
Admission: EM | Admit: 2020-06-09 | Discharge: 2020-06-22 | DRG: 064 | Disposition: A | Discharge status: Rehabilitation Facility | Payer: 59 | Attending: Internal Medicine | Admitting: Internal Medicine

## 2020-06-09 ENCOUNTER — Other Ambulatory Visit: Payer: Self-pay

## 2020-06-09 ENCOUNTER — Inpatient Hospital Stay: Payer: Self-pay

## 2020-06-09 DIAGNOSIS — F10239 Alcohol dependence with withdrawal, unspecified: Secondary | ICD-10-CM | POA: Diagnosis present

## 2020-06-09 DIAGNOSIS — D62 Acute posthemorrhagic anemia: Secondary | ICD-10-CM | POA: Diagnosis present

## 2020-06-09 DIAGNOSIS — F05 Delirium due to known physiological condition: Secondary | ICD-10-CM | POA: Diagnosis not present

## 2020-06-09 DIAGNOSIS — N179 Acute kidney failure, unspecified: Secondary | ICD-10-CM | POA: Diagnosis not present

## 2020-06-09 DIAGNOSIS — I615 Nontraumatic intracerebral hemorrhage, intraventricular: Secondary | ICD-10-CM | POA: Diagnosis present

## 2020-06-09 DIAGNOSIS — I420 Dilated cardiomyopathy: Secondary | ICD-10-CM | POA: Diagnosis present

## 2020-06-09 DIAGNOSIS — G8104 Flaccid hemiplegia affecting left nondominant side: Secondary | ICD-10-CM | POA: Diagnosis present

## 2020-06-09 DIAGNOSIS — E875 Hyperkalemia: Secondary | ICD-10-CM | POA: Diagnosis not present

## 2020-06-09 DIAGNOSIS — G936 Cerebral edema: Secondary | ICD-10-CM | POA: Diagnosis present

## 2020-06-09 DIAGNOSIS — I10 Essential (primary) hypertension: Secondary | ICD-10-CM | POA: Diagnosis not present

## 2020-06-09 DIAGNOSIS — I110 Hypertensive heart disease with heart failure: Secondary | ICD-10-CM | POA: Diagnosis present

## 2020-06-09 DIAGNOSIS — I161 Hypertensive emergency: Secondary | ICD-10-CM | POA: Diagnosis present

## 2020-06-09 DIAGNOSIS — E871 Hypo-osmolality and hyponatremia: Secondary | ICD-10-CM | POA: Diagnosis present

## 2020-06-09 DIAGNOSIS — I674 Hypertensive encephalopathy: Secondary | ICD-10-CM | POA: Diagnosis present

## 2020-06-09 DIAGNOSIS — E785 Hyperlipidemia, unspecified: Secondary | ICD-10-CM | POA: Diagnosis not present

## 2020-06-09 DIAGNOSIS — I5042 Chronic combined systolic (congestive) and diastolic (congestive) heart failure: Secondary | ICD-10-CM | POA: Diagnosis present

## 2020-06-09 DIAGNOSIS — G441 Vascular headache, not elsewhere classified: Secondary | ICD-10-CM | POA: Diagnosis not present

## 2020-06-09 DIAGNOSIS — G40209 Localization-related (focal) (partial) symptomatic epilepsy and epileptic syndromes with complex partial seizures, not intractable, without status epilepticus: Secondary | ICD-10-CM | POA: Diagnosis not present

## 2020-06-09 DIAGNOSIS — R443 Hallucinations, unspecified: Secondary | ICD-10-CM | POA: Diagnosis not present

## 2020-06-09 DIAGNOSIS — G935 Compression of brain: Secondary | ICD-10-CM | POA: Diagnosis present

## 2020-06-09 DIAGNOSIS — M545 Low back pain, unspecified: Secondary | ICD-10-CM | POA: Diagnosis not present

## 2020-06-09 DIAGNOSIS — Z20822 Contact with and (suspected) exposure to covid-19: Secondary | ICD-10-CM | POA: Diagnosis present

## 2020-06-09 DIAGNOSIS — Z79899 Other long term (current) drug therapy: Secondary | ICD-10-CM

## 2020-06-09 DIAGNOSIS — Z6836 Body mass index (BMI) 36.0-36.9, adult: Secondary | ICD-10-CM

## 2020-06-09 DIAGNOSIS — Z7189 Other specified counseling: Secondary | ICD-10-CM | POA: Diagnosis not present

## 2020-06-09 DIAGNOSIS — R569 Unspecified convulsions: Secondary | ICD-10-CM | POA: Diagnosis not present

## 2020-06-09 DIAGNOSIS — R29810 Facial weakness: Secondary | ICD-10-CM | POA: Diagnosis present

## 2020-06-09 DIAGNOSIS — Z515 Encounter for palliative care: Secondary | ICD-10-CM | POA: Diagnosis not present

## 2020-06-09 DIAGNOSIS — R471 Dysarthria and anarthria: Secondary | ICD-10-CM | POA: Diagnosis present

## 2020-06-09 DIAGNOSIS — F419 Anxiety disorder, unspecified: Secondary | ICD-10-CM | POA: Diagnosis not present

## 2020-06-09 DIAGNOSIS — G43909 Migraine, unspecified, not intractable, without status migrainosus: Secondary | ICD-10-CM | POA: Diagnosis present

## 2020-06-09 DIAGNOSIS — I69319 Unspecified symptoms and signs involving cognitive functions following cerebral infarction: Secondary | ICD-10-CM | POA: Diagnosis not present

## 2020-06-09 DIAGNOSIS — R1310 Dysphagia, unspecified: Secondary | ICD-10-CM | POA: Diagnosis present

## 2020-06-09 DIAGNOSIS — I69354 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-dominant side: Secondary | ICD-10-CM | POA: Diagnosis not present

## 2020-06-09 DIAGNOSIS — G40909 Epilepsy, unspecified, not intractable, without status epilepticus: Secondary | ICD-10-CM | POA: Diagnosis not present

## 2020-06-09 DIAGNOSIS — I619 Nontraumatic intracerebral hemorrhage, unspecified: Secondary | ICD-10-CM | POA: Diagnosis present

## 2020-06-09 DIAGNOSIS — I629 Nontraumatic intracranial hemorrhage, unspecified: Secondary | ICD-10-CM | POA: Diagnosis present

## 2020-06-09 DIAGNOSIS — R29716 NIHSS score 16: Secondary | ICD-10-CM | POA: Diagnosis present

## 2020-06-09 DIAGNOSIS — I610 Nontraumatic intracerebral hemorrhage in hemisphere, subcortical: Secondary | ICD-10-CM

## 2020-06-09 DIAGNOSIS — I69154 Hemiplegia and hemiparesis following nontraumatic intracerebral hemorrhage affecting left non-dominant side: Secondary | ICD-10-CM | POA: Diagnosis not present

## 2020-06-09 DIAGNOSIS — F1010 Alcohol abuse, uncomplicated: Secondary | ICD-10-CM | POA: Diagnosis not present

## 2020-06-09 DIAGNOSIS — I69391 Dysphagia following cerebral infarction: Secondary | ICD-10-CM | POA: Diagnosis not present

## 2020-06-09 DIAGNOSIS — Z9114 Patient's other noncompliance with medication regimen: Secondary | ICD-10-CM

## 2020-06-09 LAB — DIFFERENTIAL
Abs Immature Granulocytes: 0.03 10*3/uL (ref 0.00–0.07)
Basophils Absolute: 0 10*3/uL (ref 0.0–0.1)
Basophils Relative: 0 %
Eosinophils Absolute: 0 10*3/uL (ref 0.0–0.5)
Eosinophils Relative: 0 %
Immature Granulocytes: 0 %
Lymphocytes Relative: 10 %
Lymphs Abs: 0.9 10*3/uL (ref 0.7–4.0)
Monocytes Absolute: 0.5 10*3/uL (ref 0.1–1.0)
Monocytes Relative: 6 %
Neutro Abs: 7.8 10*3/uL — ABNORMAL HIGH (ref 1.7–7.7)
Neutrophils Relative %: 84 %

## 2020-06-09 LAB — CBC
HCT: 41.1 % (ref 39.0–52.0)
Hemoglobin: 13.8 g/dL (ref 13.0–17.0)
MCH: 31.4 pg (ref 26.0–34.0)
MCHC: 33.6 g/dL (ref 30.0–36.0)
MCV: 93.4 fL (ref 80.0–100.0)
Platelets: 260 10*3/uL (ref 150–400)
RBC: 4.4 MIL/uL (ref 4.22–5.81)
RDW: 11.9 % (ref 11.5–15.5)
WBC: 9.3 10*3/uL (ref 4.0–10.5)
nRBC: 0 % (ref 0.0–0.2)

## 2020-06-09 LAB — RAPID URINE DRUG SCREEN, HOSP PERFORMED
Amphetamines: NOT DETECTED
Barbiturates: NOT DETECTED
Benzodiazepines: NOT DETECTED
Cocaine: NOT DETECTED
Opiates: NOT DETECTED
Tetrahydrocannabinol: NOT DETECTED

## 2020-06-09 LAB — COMPREHENSIVE METABOLIC PANEL
ALT: 38 U/L (ref 0–44)
AST: 40 U/L (ref 15–41)
Albumin: 4.2 g/dL (ref 3.5–5.0)
Alkaline Phosphatase: 66 U/L (ref 38–126)
Anion gap: 11 (ref 5–15)
BUN: 14 mg/dL (ref 6–20)
CO2: 25 mmol/L (ref 22–32)
Calcium: 9.6 mg/dL (ref 8.9–10.3)
Chloride: 102 mmol/L (ref 98–111)
Creatinine, Ser: 1.16 mg/dL (ref 0.61–1.24)
GFR, Estimated: >60 mL/min (ref >60)
Glucose, Bld: 133 mg/dL — ABNORMAL HIGH (ref 70–99)
Potassium: 4 mmol/L (ref 3.5–5.1)
Sodium: 138 mmol/L (ref 135–145)
Total Bilirubin: 0.9 mg/dL (ref 0.3–1.2)
Total Protein: 7.6 g/dL (ref 6.5–8.1)

## 2020-06-09 LAB — CBG MONITORING, ED: Glucose-Capillary: 123 mg/dL — ABNORMAL HIGH (ref 70–99)

## 2020-06-09 LAB — TSH: TSH: 0.483 u[IU]/mL (ref 0.350–4.500)

## 2020-06-09 LAB — SODIUM
Sodium: 144 mmol/L (ref 135–145)
Sodium: 141 mmol/L (ref 135–145)

## 2020-06-09 LAB — HEMOGLOBIN A1C
Hgb A1c MFr Bld: 5.5 % (ref 4.8–5.6)
Mean Plasma Glucose: 111.15 mg/dL

## 2020-06-09 LAB — I-STAT CHEM 8, ED
BUN: 17 mg/dL (ref 6–20)
Calcium, Ion: 1.14 mmol/L — ABNORMAL LOW (ref 1.15–1.40)
Chloride: 102 mmol/L (ref 98–111)
Creatinine, Ser: 0.9 mg/dL (ref 0.61–1.24)
Glucose, Bld: 123 mg/dL — ABNORMAL HIGH (ref 70–99)
HCT: 44 % (ref 39.0–52.0)
Hemoglobin: 15 g/dL (ref 13.0–17.0)
Potassium: 3.9 mmol/L (ref 3.5–5.1)
Sodium: 138 mmol/L (ref 135–145)
TCO2: 26 mmol/L (ref 22–32)

## 2020-06-09 LAB — HIV ANTIBODY (ROUTINE TESTING W REFLEX): HIV Screen 4th Generation wRfx: NONREACTIVE

## 2020-06-09 LAB — LIPID PANEL
Cholesterol: 196 mg/dL (ref 0–200)
HDL: 52 mg/dL (ref >40)
LDL Cholesterol: 78 mg/dL (ref 0–99)
Total CHOL/HDL Ratio: 3.8 RATIO
Triglycerides: 332 mg/dL — ABNORMAL HIGH (ref <150)
VLDL: 66 mg/dL — ABNORMAL HIGH (ref 0–40)

## 2020-06-09 LAB — VITAMIN B12: Vitamin B-12: 833 pg/mL (ref 180–914)

## 2020-06-09 LAB — PROTIME-INR
INR: 1 (ref 0.8–1.2)
Prothrombin Time: 12.8 seconds (ref 11.4–15.2)

## 2020-06-09 LAB — APTT: aPTT: 37 seconds — ABNORMAL HIGH (ref 24–36)

## 2020-06-09 LAB — MRSA PCR SCREENING: MRSA by PCR: NEGATIVE

## 2020-06-09 MED ORDER — ACETAMINOPHEN 160 MG/5ML PO SOLN: 650.0000 mg | ORAL | Status: DC | PRN

## 2020-06-09 MED ORDER — CLEVIDIPINE BUTYRATE 0.5 MG/ML IV EMUL
0.0000 mg/h | INTRAVENOUS | Status: DC
  Administered 2020-06-09 (×4): 32 mg/h via INTRAVENOUS
  Administered 2020-06-09: 2 mg/h via INTRAVENOUS
  Administered 2020-06-09 (×3): 32 mg/h via INTRAVENOUS
  Administered 2020-06-10: 31 mg/h via INTRAVENOUS
  Administered 2020-06-10: 21 mg/h via INTRAVENOUS
  Administered 2020-06-10 (×2): 31 mg/h via INTRAVENOUS
  Administered 2020-06-10 (×2): 32 mg/h via INTRAVENOUS
  Administered 2020-06-10: 21 mg/h via INTRAVENOUS
  Administered 2020-06-10: 31 mg/h via INTRAVENOUS
  Administered 2020-06-10: 21 mg/h via INTRAVENOUS
  Administered 2020-06-10 (×3): 32 mg/h via INTRAVENOUS
  Administered 2020-06-11: 28 mg/h via INTRAVENOUS
  Administered 2020-06-11: 5 mg/h via INTRAVENOUS
  Administered 2020-06-11: 32 mg/h via INTRAVENOUS
  Administered 2020-06-11: 31 mg/h via INTRAVENOUS
  Administered 2020-06-11: 28 mg/h via INTRAVENOUS
  Administered 2020-06-11 (×3): 32 mg/h via INTRAVENOUS
  Administered 2020-06-11: 20 mg/h via INTRAVENOUS
  Administered 2020-06-12: 1 mg/h via INTRAVENOUS
  Filled 2020-06-09: qty 50
  Filled 2020-06-09 (×7): qty 100
  Filled 2020-06-09 (×2): qty 50
  Filled 2020-06-09: qty 100
  Filled 2020-06-09: qty 200
  Filled 2020-06-09 (×3): qty 100
  Filled 2020-06-09 (×2): qty 50
  Filled 2020-06-09: qty 100
  Filled 2020-06-09: qty 50
  Filled 2020-06-09: qty 200
  Filled 2020-06-09 (×3): qty 100
  Filled 2020-06-09: qty 200
  Filled 2020-06-09 (×2): qty 100
  Filled 2020-06-09: qty 200

## 2020-06-09 MED ORDER — PANTOPRAZOLE SODIUM 40 MG IV SOLR
40.0000 mg | Freq: Every day | INTRAVENOUS | Status: DC
  Administered 2020-06-09: 40 mg via INTRAVENOUS
  Filled 2020-06-09: qty 40

## 2020-06-09 MED ORDER — LABETALOL HCL 5 MG/ML IV SOLN: 5.0000 mg | INTRAVENOUS | Status: DC | PRN

## 2020-06-09 MED ORDER — SODIUM CHLORIDE 0.9% FLUSH
3.0000 mL | Freq: Once | INTRAVENOUS | Status: AC
  Administered 2020-06-09: 3 mL via INTRAVENOUS

## 2020-06-09 MED ORDER — LORAZEPAM 2 MG/ML IJ SOLN: 1.0000 mg | INTRAMUSCULAR | Status: DC | PRN

## 2020-06-09 MED ORDER — CHLORHEXIDINE GLUCONATE CLOTH 2 % EX PADS
6.0000 | MEDICATED_PAD | Freq: Every day | CUTANEOUS | Status: DC
  Administered 2020-06-09 – 2020-06-21 (×13): 6 via TOPICAL

## 2020-06-09 MED ORDER — ACETAMINOPHEN 650 MG RE SUPP
650.0000 mg | RECTAL | Status: DC | PRN
  Administered 2020-06-10: 650 mg via RECTAL
  Filled 2020-06-09: qty 1

## 2020-06-09 MED ORDER — SENNOSIDES-DOCUSATE SODIUM 8.6-50 MG PO TABS
1.0000 | ORAL_TABLET | Freq: Two times a day (BID) | ORAL | Status: DC
  Administered 2020-06-10 – 2020-06-22 (×22): 1 via ORAL
  Filled 2020-06-09 (×19): qty 1

## 2020-06-09 MED ORDER — STROKE: EARLY STAGES OF RECOVERY BOOK: Freq: Once | Status: DC

## 2020-06-09 MED ORDER — LABETALOL HCL 5 MG/ML IV SOLN
10.0000 mg | Freq: Once | INTRAVENOUS | Status: AC
  Administered 2020-06-09: 10 mg via INTRAVENOUS

## 2020-06-09 MED ORDER — ACETAMINOPHEN 325 MG PO TABS
650.0000 mg | ORAL_TABLET | ORAL | Status: DC | PRN
  Administered 2020-06-11 – 2020-06-17 (×15): 650 mg via ORAL
  Filled 2020-06-09 (×15): qty 2

## 2020-06-09 MED ORDER — LABETALOL HCL 5 MG/ML IV SOLN
5.0000 mg | INTRAVENOUS | Status: DC | PRN
  Administered 2020-06-09 – 2020-06-10 (×4): 5 mg via INTRAVENOUS
  Filled 2020-06-09 (×2): qty 4

## 2020-06-09 MED ORDER — THIAMINE HCL 100 MG PO TABS
100.0000 mg | ORAL_TABLET | Freq: Every day | ORAL | Status: DC
  Administered 2020-06-10 – 2020-06-22 (×11): 100 mg via ORAL
  Filled 2020-06-09 (×11): qty 1

## 2020-06-09 MED ORDER — SODIUM CHLORIDE 3 % IV BOLUS
250.0000 mL | Freq: Once | INTRAVENOUS | Status: AC
  Administered 2020-06-09: 250 mL via INTRAVENOUS
  Filled 2020-06-09 (×2): qty 500

## 2020-06-09 MED ORDER — ORAL CARE MOUTH RINSE
15.0000 mL | Freq: Two times a day (BID) | OROMUCOSAL | Status: DC
  Administered 2020-06-09 – 2020-06-22 (×26): 15 mL via OROMUCOSAL

## 2020-06-09 MED ORDER — SODIUM CHLORIDE 3 % IV SOLN
INTRAVENOUS | Status: DC
  Filled 2020-06-09 (×9): qty 500

## 2020-06-09 MED ORDER — THIAMINE HCL 100 MG/ML IJ SOLN
100.0000 mg | Freq: Every day | INTRAMUSCULAR | Status: DC
  Administered 2020-06-09 – 2020-06-19 (×3): 100 mg via INTRAVENOUS
  Filled 2020-06-09 (×5): qty 2

## 2020-06-09 NOTE — ED Provider Notes (Signed)
[facility] EMERGENCY DEPARTMENT Provider Note   CSN: [id number] Arrival date & time: 06/09/20  1601     History Chief Complaint  Patient presents with  . Code Stroke    [name] is a 55 y.o. male.  To ER chief complaint of left-sided weakness and right-sided gaze preference.  Wife noticed that he had not yet left the home and returned to check on him and found him on the floor.  Patient states that he had weakness and cannot walk.  He was last known well last night at 10 PM.  Brought in as a stroke alert here.        Past Medical History:  Diagnosis Date  . Chronic combined systolic and diastolic CHF (congestive heart failure) (HCC)    EF 25-30%  . Diastolic dysfunction   . Dyslipidemia   . Hyperlipidemia   . Hypertension   . Nonischemic dilated cardiomyopathy (HCC)    felt secondary to HTN with no ischemia on nuclear stress test 05/2012, EF 25-30%  . Shortness of breath     Patient Active Problem List   Diagnosis Date Noted  . ICH (intracerebral hemorrhage) (HCC) 06/09/2020  . Sprain of anterior talofibular ligament of left ankle 01/15/2020  . Hyperlipidemia, on Lipitor 04/01/2017  . Alcohol use disorder, mild, abuse 06/30/2016  . Morbid obesity (HCC) 06/30/2016  . 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 HTN (hypertension) 05/25/2012    Past Surgical History:  Procedure Laterality Date  . KNEE ARTHROSCOPY Left 04/13/2016   Procedure: ARTHROSCOPY KNEE WITH DEBRIDEMENT;  Surgeon: Valeria Batman, MD;  Location: [facility];  Service: Orthopedics;  Laterality: Left;  . KNEE ARTHROSCOPY WITH MEDIAL MENISECTOMY Right 04/13/2016   Procedure: KNEE ARTHROSCOPY WITH MEDIAL MENISECTOMY;  Surgeon: Valeria Batman, MD;  Location:  SURGERY CENTER;  Service: Orthopedics;  Laterality: Right;  . NASAL FRACTURE SURGERY          Family History  Problem Relation Age of Onset  . Hypertension Mother        Passed away when she is 66 secondary to hip surgery complications  . CAD Mother   . Heart attack Mother   . Hypertension Father   . Heart attack Maternal Grandmother     Social History   Tobacco Use  . Smoking status: Never Smoker  . Smokeless tobacco: Never Used  Vaping Use  . Vaping Use: Never used  Substance Use Topics  . Alcohol use: Yes    Alcohol/week: 24.0 standard drinks    Types: 24 Cans of beer per week    Comment: 2 beers per day  . Drug use: No    Home Medications Prior to Admission medications   Medication Sig Start Date End Date Taking? Authorizing Provider  atorvastatin (LIPITOR) 40 MG tablet Take 1 tablet (40 mg total) by mouth daily at 6 PM. 12/22/19   Lamptey, Britta Mccreedy, MD  hydrochlorothiazide (HYDRODIURIL) 12.5 MG tablet Take 1 tablet (12.5 mg total) by mouth every other day. 12/22/19   Merrilee Jansky, MD  losartan (COZAAR) 50 MG tablet Take 1 tablet (50 mg total) by mouth daily. 12/22/19   Lamptey, Britta Mccreedy, MD  metoprolol tartrate (LOPRESSOR) 100 MG tablet Take 1 tablet (100 mg total) by mouth 2 (two) times daily. 12/22/19   Merrilee Jansky, MD  Multiple Vitamin (MULTIVITAMIN WITH MINERALS) TABS tablet Take 1 tablet  by mouth daily.    [provider]  predniSONE (DELTASONE) 10 MG tablet 6 tabs po day 1, 5 tabs po day 2, 4 tabs po day 3, 3 tabs po day 4, 2 tabs po day 5, 1 tab po day 6 05/24/20   Hudnall, Azucena Fallen, MD    Allergies    Patient has no known allergies.  Review of Systems   Review of Systems  Unable to perform ROS: Acuity of condition    Physical Exam Updated Vital Signs BP (!) 151/78   Pulse 97   Resp 19   Ht 5\' 10"  (1.778 m)   Wt 100.2 kg   SpO2 100%   BMI 31.71 kg/m   Physical Exam Constitutional:      Appearance: He is well-developed.  HENT:     Head: Normocephalic.     Nose: Nose normal.  Eyes:     Comments: Sided gaze  preference noted.  Diminished visual field on the left side.  Cardiovascular:     Rate and Rhythm: Normal rate.  Pulmonary:     Effort: Pulmonary effort is normal.  Abdominal:     General: There is no distension.     Tenderness: There is no abdominal tenderness.  Skin:    Coloration: Skin is not jaundiced.  Neurological:     Mental Status: He is alert.     Comments: Left upper and left lower extremity weakness present.     ED Results / Procedures / Treatments   Labs (all labs ordered are listed, but only abnormal results are displayed) Labs Reviewed  APTT - Abnormal; Notable for the following components:      Result Value   aPTT 37 (*)    All other components within normal limits  DIFFERENTIAL - Abnormal; Notable for the following components:   Neutro Abs 7.8 (*)    All other components within normal limits  COMPREHENSIVE METABOLIC PANEL - Abnormal; Notable for the following components:   Glucose, Bld 133 (*)    All other components within normal limits  I-STAT CHEM 8, ED - Abnormal; Notable for the following components:   Glucose, Bld 123 (*)    Calcium, Ion 1.14 (*)    All other components within normal limits  CBG MONITORING, ED - Abnormal; Notable for the following components:   Glucose-Capillary 123 (*)    All other components within normal limits  PROTIME-INR  CBC  HIV ANTIBODY (ROUTINE TESTING W REFLEX)  HEMOGLOBIN A1C  LIPID PANEL  SODIUM  SODIUM  SODIUM    EKG None  Radiology CT HEAD CODE STROKE WO CONTRAST  Result Date: 06/09/2020 CLINICAL DATA:  Code stroke.  Rightward gaze.  Left arm weakness. EXAM: CT HEAD WITHOUT CONTRAST TECHNIQUE: Contiguous axial images were obtained from the base of the skull through the vertex without intravenous contrast. COMPARISON:  None. FINDINGS: Brain: An acute hemorrhage centered in the right thalamus measures 3.6 x 2.8 x 2.7 cm (approximate volume of 14 mL). There is intraventricular extension with a small amount of  hemorrhage in the right greater than left lateral ventricles, third ventricle, and fourth ventricle. The ventricles are not grossly dilated, however prior imaging is not available to no the patient's baseline ventricle size. There is mild edema surrounding the right thalamic hemorrhage. There is 4 mm of leftward midline shift at the level of the thalami. Hypodensities in the cerebral white matter bilaterally are nonspecific but likely reflect mild chronic small vessel ischemic disease given the patient's vascular  risk factors. No acute cortically based infarct or extra-axial fluid collection is evident. Vascular: No hyperdense vessel. Skull: No fracture or suspicious osseous lesion. Sinuses/Orbits: Minimal mucosal thickening in the right sphenoid sinus. Clear mastoid air cells. Left cataract extraction. Rightward gaze. Other: None. ASPECTS [facility] Stroke Program Early CT Score) Not scored in the presence of acute hemorrhage. IMPRESSION: 1. 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 2. 4 mm of leftward midline shift. 3. Mild chronic small vessel ischemic disease. These results were communicated to Dr. Wilford Corner at 4:17 pm on 06/09/2020 by text page via the [facility] messaging system. Electronically Signed   By: Sebastian Ache M.D.   On: 06/09/2020 16:21   [place] EKG SITE RITE  Result Date: 06/09/2020 If Site Rite image not attached, placement could not be confirmed due to current cardiac rhythm.   Procedures .Central Line  Date/Time: 06/09/2020 5:29 PM Performed by: Cheryll Cockayne, MD Authorized by: Cheryll Cockayne, MD   Consent:    Consent obtained:  Emergent situation   Consent given by:  Spouse Procedure details:    Location:  L internal jugular   Number of attempts:  2 Post-procedure details:    Post-procedure:  Dressing applied   Assessment:  Blood return through all ports Comments:     difficult obtain additional IV access despite multiple attempts with peripheral ultrasounds.   Left-sided IJ placed by myself with ultrasound guidance and Seldinger technique.  maxiumim sterile barrier technique applied.  Dark appearing nonpulsatile blood return from all 3 ports. .Critical Care E&M Performed by: Cheryll Cockayne, MD  Critical care provider statement:    Critical care time (minutes):  40   Critical care time was exclusive of:  Separately billable procedures and treating other patients   Critical care was necessary to treat or prevent imminent or life-threatening deterioration of the following conditions:  CNS failure or compromise After initial E/M assessment, critical care services were subsequently performed that were exclusive of separately billable procedures or treatment.       Medications Ordered in ED Medications  clevidipine (CLEVIPREX) infusion 0.5 mg/mL (32 mg/hr Intravenous New Bag/Given 06/09/20 1722)   stroke: mapping our early stages of recovery book (has no administration in time range)  acetaminophen (TYLENOL) tablet 650 mg (has no administration in time range)    Or  acetaminophen (TYLENOL) 160 MG/5ML solution 650 mg (has no administration in time range)    Or  acetaminophen (TYLENOL) suppository 650 mg (has no administration in time range)  senna-docusate (Senokot-S) tablet 1 tablet (has no administration in time range)  pantoprazole (PROTONIX) injection 40 mg (has no administration in time range)  sodium chloride 3% (hypertonic) IV bolus 250 mL (has no administration in time range)  sodium chloride (hypertonic) 3 % solution ( Intravenous New Bag/Given 06/09/20 1707)  sodium chloride flush (NS) 0.9 % injection 3 mL (3 mLs Intravenous Given 06/09/20 1713)  labetalol (NORMODYNE) injection 10 mg (10 mg Intravenous Given 06/09/20 1630)    ED Course  I have reviewed the triage vital signs and the nursing notes.  Pertinent labs & imaging results that were available during my care of the patient were reviewed by me and considered in my medical decision  making (see chart for details).    MDM Rules/Calculators/A&P                          Stroke alert activated on arrival.  CT scan concerning for intracranial hemorrhage.  Patient started on Cleviprex.  Hypertonic saline started.  Will be admitted to the neuro ICU.   Final Clinical Impression(s) / ED Diagnoses Final diagnoses:  None    Rx / DC Orders ED Discharge Orders    None       Cheryll Cockayne, MD 06/09/20 1730

## 2020-06-09 NOTE — Progress Notes (Signed)
PICC order received. MD will place cvc.

## 2020-06-09 NOTE — Code Documentation (Signed)
Patient was LSN by his wife on 06/08/20 at 32 when she went to work. She noticed today by the Ring device that pt had not gone to work this am like normal. She went home and found patient down in the bathroom not moving his left side. She called 911 and [facility] arrived. Pt had a right gaze, left hemiplegia, slurred speech and a facial droop. His BP was 200/118 with EMS. Pt taken to [facility], met by the stroke team and cleared for CT. Pt is not on any blood thinners. CT results below. No TPA r/t hemorrhage. Pt was taken back to ED room and started on Cleviprex to keep BP<140. He is on the non-rebreather and still alert at this time answering questions. His NIHSS is 15 (see stroke documentation for details). Some delay getting IV access. PICC order placed so that hypertonic saline can be started. MD consulted neurosurgery. No interventions at this time. 4N ICU charge RN aware of bed need. Family at the bedside was updated. Care Plan: q1 vitals/neuro/pupil checks. BP<140. Hand off with ED RN.   "IMPRESSION: 1. 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 2. 4 mm of leftward midline shift. 3. Mild chronic small vessel ischemic disease"  Calayah Guadarrama, Rande Brunt, RN  Stroke Response Nurse

## 2020-06-09 NOTE — Progress Notes (Signed)
Swelling noted on neck/upper shoulder area. This is also where the central line has been placed. No crepitus was felt during palpation. Unsure of if this was normal or not - paged CCM.  Patient mentioned it was normal and just extra tissue.   Xray shows normal line placement but enlarged mediastinum. CCM mentioned adding a CT of chest and soft tissues of neck to confirm no abnormality.   Paged neuro to inform and will add scans to scheduled head CT

## 2020-06-09 NOTE — ED Notes (Signed)
Attempted to place foley catheter.   Was able to advance catheter until 2-3 inches from the bifurcation. No urine was noted at this time.  Pt then states that they fell like they needed to urinate and urinated around the foley.  Urine entered the tubing prior to trying to advanceing the catheter further.   Catheter removed and RN/ EDP notified.

## 2020-06-09 NOTE — H&P (Addendum)
Neurology H&P  CC: Code stroke-left-sided weakness, right gaze  History is obtained from: EMS, wife  HPI: [name] is a 55 y.o. male with a PMHx of obesity, HTN, non ischemic dilated cardiomyopathy, HLD, Cs/dHF who presents to ED via EMS as a code stroke. LKW 2200 hours 06/08/20. Per patient, he woke up around 0700 hours, but was weak. His wife noted via Ring doorbell that patient did not go to work. She left work early and came home at 1500 hours to find patient lying in the bathroom. He had a right gaze preference, right sided weakness, and right sided numbness. 911 was called. BP 200s in the field, and higher up to 230s here. Bleed was suspected due to patient c/o HA with high BP.   No personal of FMHx of stroke.   After brief exam on ED bridge with airway clearance, patient was taken emergently to CT. 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 75mm shift.   Patient was taken back to ED and after placement of [place] IV, Labetalol 10mg  IV push was given and Clexiprex was started. Target BP 120-140. Patient became more sleepy in ED room and was snoring at times. ED MD informed that patient may need intubation. His O2 sats stayed 100% with 15L NRB.   A bolus of 3% saline was ordered but additional IV being started.   Wife came and Dr. spoke with her. She states he takes his medicines routinely, but patient stated he did not. He is not on any anticoagulation or antiplatelet medications.   In review of chart, it appears he was first started on anti hypertensives by [facility] in 2020. It also appears he was at [facility] multiple times for for medication refills. He was referred to PCP, but unknown if he went. NP does not see OV note for a PCP, but he may be seeing someone without Epic EMR.    LKW: 2200 tpa given?: No, ICH IR Thrombectomy? No, ICH MRS 0 ICH score 1    ROS: + HA. Unable to do robust ROS due to emergent nature of event.   Past Medical  History:  Diagnosis Date  . Chronic combined systolic and diastolic CHF (congestive heart failure) (HCC)    EF 25-30%  . Diastolic dysfunction   . Dyslipidemia   . Hyperlipidemia   . Hypertension   . Nonischemic dilated cardiomyopathy (HCC)    felt secondary to HTN with no ischemia on nuclear stress test 05/2012, EF 25-30%  . Shortness of breath     Family History  Problem Relation Age of Onset  . Hypertension Mother        Passed away when she is 34 secondary to hip surgery complications  . CAD Mother   . Heart attack Mother   . Hypertension Father   . Heart attack Maternal Grandmother    Social History:  reports that he has never smoked. He has never used smokeless tobacco. He reports current alcohol use of about 24.0 standard drinks of alcohol per week. He reports that he does not use drugs.  Prior to Admission medications   Medication Sig Start Date End Date Taking? Authorizing Provider  atorvastatin (LIPITOR) 40 MG tablet Take 1 tablet (40 mg total) by mouth daily at 6 PM. 12/22/19   Lamptey, 12/24/19, MD  hydrochlorothiazide (HYDRODIURIL) 12.5 MG tablet Take 1 tablet (12.5 mg total) by mouth every other day. 12/22/19   Lamptey, 12/24/19, MD  losartan (COZAAR) 50 MG tablet Take 1 tablet (50 mg total) by mouth daily. 12/22/19   Lamptey, Britta Mccreedy, MD  metoprolol tartrate (LOPRESSOR) 100 MG tablet Take 1 tablet (100 mg total) by mouth 2 (two) times daily. 12/22/19   LampteyBritta Mccreedy, MD  Multiple Vitamin (MULTIVITAMIN WITH MINERALS) TABS tablet Take 1 tablet by mouth daily.    [provider]  predniSONE (DELTASONE) 10 MG tablet 6 tabs po day 1, 5 tabs po day 2, 4 tabs po day 3, 3 tabs po day 4, 2 tabs po day 5, 1 tab po day 6 05/24/20   Hudnall, Azucena Fallen, MD    Exam: Current vital signs: BP 132/73   Pulse (!) 101   Resp 19   Ht 5\' 10"  (1.778 m)   Wt 100.2 kg   SpO2 100%   BMI 31.71 kg/m   Today's Vitals   06/09/20 1741 06/09/20 1743 06/09/20 1745 06/09/20 1747   BP: (!) 154/83 126/77 (!) 156/68 132/73  Pulse: 100 97 100 (!) 101  Resp: 17 18 19 19   SpO2: 100% 100% 100% 100%  Weight:      Height:      PainSc:       Body mass index is 31.71 kg/m.  Physical Exam  See attending exam    I have reviewed labs in epic and the pertinent results are: Glucose 123    Creat .90      Hgb 15    INR 1   APTT 37   MD reviewed the images obtained: CTH 1. 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 2. 4 mm of leftward midline shift. 3. Mild chronic small vessel ischemic disease.  Assessment: 55 yo male with a PMHx of uncontrolled hypertension with ? adherence to medications and treatment plan. Unsure if patient has a PCP. Patient presented as a code stroke today. Bleed was suspected and confirmed with CTH. Patient with SBP 200-230. Labetalol 10mg  IV x 1 and Cleviprex started with BP lowering. Due to increased sleepiness, patient may require intubation at some point. His loss of left sided sensation is consistent with thalamic location of  hemorrhage. 3% saline started for cerebral edema. Is uncontrolled HTN is likely the etiology of ICH. He will need medication adherence education and if a cost issue, get case manager involved. He will be admitted to ICU with plan as below.   Impression:  1. ICH Rt thalamus 2. IVH 3. HTN emergency   Plan: - Admit to ICU. -NPO -MRI brain with and without contrast.  -3% saline s/p bolus, 75cc/hr.  -Goal Na 155-160.  -If Na over 160, stop 3% saline and call MD.  -Serial serum Na q6 hrs and action per above protocol.  -no antiplatelets or anticoagulants. -SCDs only.  -tight blood pressure control with goal systolic 120 - 140. -CIWA protocol.  -HbA1c, lipid profile, TSH.  -clexiprex as needed. -Frequent neuro checks -NIHSS per protocol.  - If symptoms worsen or there is decreased mental status, repeat stat head CT -bedrest. -PT,OT,ST consults after off bedrest.  -Protonix. -Senna S.   -stroke education.  -stroke team to follow.    Patient seen by , NP and Dr. 55. Plan and note to be edited by MD as necessary.    Attending addendum Patient seen and examined is an acute code stroke for right gaze and left-sided weakness.  Referring physician: Dr.  HPI According to EMTs were called on scene by a concern wife because she  noticed that the patient had not gone to work.  He usually goes to work early in the morning but had not left the day that she was able to check this on her doorbell camera. His last known well was last night at 10 PM when he went to bed.  When he woke up this morning, he says he felt that he was unsteady and weak on the left.  He remembers falling in the bathroom at some point.  EMS was called and found him jammed between the tub and the bathroom door in the bathroom.  That went to through the window to get him.  On EMS examination, he had left-sided facility, rightward gaze, left facial droop and extremely slurred speech.  Systolic blood pressure in the 220s.  Code stroke was activated and he was brought in for further evaluation. Is a past history of hypertension and other cerebrovascular risk factors as documented above.  Reports inconsistent compliance to medications. Review of systems positive for headache and left-sided weakness.  Otherwise negative. Social history: Prior history of cocaine use.  Current history of alcohol abuse. On my examination: Patient was awake alert oriented x3 Speech was moderately dysarthric He had no aphasia Cranial nerve examination showed equal pupils round reactive to light, rightward gaze preference with inability to cross the midline, left lower facial weakness, no field cut, midline tongue and palate. Motor examination revealed flaccid left upper extremity 0/5, nearly flaccid 1/5 left lower extremity, full strength right upper and lower extremity. Sensory examination with diminished sensation and  extinction of the left side. Coordination: No dysmetria on the right, unable to perform on the left Gait testing was deferred NIH stroke scale 1a Level of Conscious.: 0 1b LOC Questions: 0 1c LOC Commands: 0 2 Best Gaze: 2 3 Visual: 0 4 Facial Palsy: 2 5a Motor Arm - left: 4 5b Motor Arm - Right: 0 6a Motor Leg - Left: 3 6b Motor Leg - Right: 0 7 Limb Ataxia: 0 8 Sensory: 2 9 Best Language: 0 10 Dysarthria: 2 11 Extinct. and Inatten.: 1 TOTAL: 16  ICH score 1 for intraventricular extension.  I have personally reviewed imaging: Stat head CT showed a right thalamic hemorrhage with intraventricular extension and 4 mm leftward shift without hydrocephalus.  Assessment: 55 year old man with past medical history of obesity, hypertension, nonischemic dilated cardiomyopathy, hyperlipidemia presented to the emergency room for sudden onset of right gaze preference, left-sided flaccid paralysis, left-sided facial weakness and slurred speech. Noted to have a right thalamic intracerebral hemorrhage with intraventricular extension along with systolic blood pressure in the 220s. Likely hypertensive bleed.  Plan: Subcortical ICH, nontraumatic IVH, nontraumatic  Acuity: Acute Laterality: Right thalamus Current suspected etiology: Hypertension Treatment: -Admit to neurological ICU -BP control goal SYS<140 -Briefly curbside neurosurgery-no indication for EVD at this time.  Consult formally if patient's mental status diminishes and there is evidence of hydrocephalus. -PT/OT/ST  -neuromonitoring -Toxicology screen  CNS Cerebral edema Compression of brain -Hyperosmolar therapy-bolus of 3% followed by 75 cc/h peripherally with goal sodium between 150-155 -Close neuro monitoring  Repeat head CT at 10 PM MRI brain with and without contrast tomorrow  Dysarthria Dysphagia following ICH  -NPO until cleared by speech -ST -Advance diet as tolerated    Hemiplegia and hemiparesis  following nontraumatic intracerebral hemorrhage affecting left non-dominant side  -Continue PT/OT/ST  RESP No active issues at this time Appeared to have some sonorous breathing Question if he has underlying sleep apnea. Close monitoring in the ICU  I have discussed with the wife, that if he deteriorates, he might need intubation.  She is fine with proceeding with intubation if it is required.  CV Hypertensive Encephalopathy Essential (primary) hypertension Hypertensive Emergency -Aggressive BP control, goal SBP <140 -Labetalol and hydralazine as needed followed by Cleviprex drip.  Heart failure, unspecified  -TTE -Cards Consult  GI/GU No active issues Gentle hydration  HEME No active issues Check CBC in the morning  ENDO Not a known diabetic Check A1c -goal HgbA1c < 7  Fluid/Electrolyte Disorders No active issues Check CMP in the morning  ID Possible Aspiration PNA -CXR -NPO -Monitor Nutrition E66.9 Obesity  -diet consult  Prophylaxis DVT: No chemical prophylaxis.  SCDs only. GI: PPI Bowel: Docusate senna  Dispo: IP Rehab likely  Diet: NPO until cleared by speech  Code Status: Full Code    THE FOLLOWING WERE PRESENT ON ADMISSION: Intracerebral hemorrhage, intraventricular hemorrhage, cerebral edema, hypertensive emergency, hypertensive encephalopathy, hemiplegia, possible aspiration pneumonia, CHF unspecified, essential hypertension  Discussed my plan with the wife in detail at bedside Also discussed my plan with Dr. Norman Clay  -- Milon Dikes, MD Neurologist [facility] Pager: [phone]   CRITICAL CARE ATTESTATION Performed by: Milon Dikes, MD Total critical care time: 55 minutes Critical care time was exclusive of separately billable procedures and treating other patients and/or supervising APPs/Residents/Students Critical care was necessary to treat or prevent imminent or life-threatening deterioration due to  intracerebral hemorrhage, hypertensive emergency This patient is critically ill and at significant risk for neurological worsening and/or death and care requires constant monitoring. Critical care was time spent personally by me on the following activities: development of treatment plan with patient and/or surrogate as well as nursing, discussions with consultants, evaluation of patient's response to treatment, examination of patient, obtaining history from patient or surrogate, ordering and performing treatments and interventions, ordering and review of laboratory studies, ordering and review of radiographic studies, pulse oximetry, re-evaluation of patient's condition, participation in multidisciplinary rounds and medical decision making of high complexity in the care of this patient.

## 2020-06-09 NOTE — ED Triage Notes (Signed)
Brought in by EMS from home. Patients wife called when she got home from work. She noted left sided weakness and right gaze. The last known well time was last night around 10pm.

## 2020-06-10 ENCOUNTER — Ambulatory Visit: Payer: Self-pay | Admitting: Sports Medicine

## 2020-06-10 ENCOUNTER — Inpatient Hospital Stay (HOSPITAL_COMMUNITY): Payer: 59

## 2020-06-10 ENCOUNTER — Encounter (HOSPITAL_COMMUNITY): Payer: Self-pay | Admitting: Student in an Organized Health Care Education/Training Program

## 2020-06-10 DIAGNOSIS — I610 Nontraumatic intracerebral hemorrhage in hemisphere, subcortical: Secondary | ICD-10-CM | POA: Diagnosis not present

## 2020-06-10 DIAGNOSIS — I615 Nontraumatic intracerebral hemorrhage, intraventricular: Secondary | ICD-10-CM | POA: Diagnosis not present

## 2020-06-10 DIAGNOSIS — I6389 Other cerebral infarction: Secondary | ICD-10-CM

## 2020-06-10 LAB — ECHOCARDIOGRAM COMPLETE
AR max vel: 3.19 cm2
AV Area VTI: 3.77 cm2
AV Area mean vel: 3.23 cm2
AV Mean grad: 7 mmHg
AV Peak grad: 13.8 mmHg
Ao pk vel: 1.86 m/s
Area-P 1/2: 4.63 cm2
Calc EF: 72.6 %
Height: 70 in
MV VTI: 3.39 cm2
S' Lateral: 2 cm
Single Plane A2C EF: 69.7 %
Single Plane A4C EF: 75.4 %
Weight: 4017.66 oz

## 2020-06-10 LAB — BASIC METABOLIC PANEL
Anion gap: 6 (ref 5–15)
BUN: 16 mg/dL (ref 6–20)
CO2: 24 mmol/L (ref 22–32)
Calcium: 8.5 mg/dL — ABNORMAL LOW (ref 8.9–10.3)
Chloride: 114 mmol/L — ABNORMAL HIGH (ref 98–111)
Creatinine, Ser: 1.68 mg/dL — ABNORMAL HIGH (ref 0.61–1.24)
GFR, Estimated: 47 mL/min — ABNORMAL LOW (ref >60)
Glucose, Bld: 163 mg/dL — ABNORMAL HIGH (ref 70–99)
Potassium: 4.3 mmol/L (ref 3.5–5.1)
Sodium: 144 mmol/L (ref 135–145)

## 2020-06-10 LAB — URINALYSIS, COMPLETE (UACMP) WITH MICROSCOPIC
Bilirubin Urine: NEGATIVE
Glucose, UA: 50 mg/dL — AB
Ketones, ur: NEGATIVE mg/dL
Leukocytes,Ua: NEGATIVE
Nitrite: NEGATIVE
Protein, ur: 30 mg/dL — AB
Specific Gravity, Urine: 1.019 (ref 1.005–1.030)
pH: 5 (ref 5.0–8.0)

## 2020-06-10 LAB — CBC
HCT: 37.4 % — ABNORMAL LOW (ref 39.0–52.0)
Hemoglobin: 12.4 g/dL — ABNORMAL LOW (ref 13.0–17.0)
MCH: 32 pg (ref 26.0–34.0)
MCHC: 33.2 g/dL (ref 30.0–36.0)
MCV: 96.6 fL (ref 80.0–100.0)
Platelets: 270 10*3/uL (ref 150–400)
RBC: 3.87 MIL/uL — ABNORMAL LOW (ref 4.22–5.81)
RDW: 12.4 % (ref 11.5–15.5)
WBC: 8.4 10*3/uL (ref 4.0–10.5)
nRBC: 0 % (ref 0.0–0.2)

## 2020-06-10 LAB — SODIUM
Sodium: 143 mmol/L (ref 135–145)
Sodium: 147 mmol/L — ABNORMAL HIGH (ref 135–145)
Sodium: 145 mmol/L (ref 135–145)

## 2020-06-10 LAB — SARS CORONAVIRUS 2 (TAT 6-24 HRS): SARS Coronavirus 2: NEGATIVE

## 2020-06-10 MED ORDER — FOLIC ACID 1 MG PO TABS: 1.0000 mg | ORAL_TABLET | Freq: Every day | ORAL | Status: DC

## 2020-06-10 MED ORDER — PANTOPRAZOLE SODIUM 40 MG PO TBEC
40.0000 mg | DELAYED_RELEASE_TABLET | Freq: Every day | ORAL | Status: DC
  Administered 2020-06-10 – 2020-06-22 (×13): 40 mg via ORAL
  Filled 2020-06-10 (×14): qty 1

## 2020-06-10 MED ORDER — GADOBUTROL 1 MMOL/ML IV SOLN
10.0000 mL | Freq: Once | INTRAVENOUS | Status: AC | PRN
  Administered 2020-06-10: 10 mL via INTRAVENOUS

## 2020-06-10 MED ORDER — LORAZEPAM 2 MG/ML IJ SOLN
1.0000 mg | INTRAMUSCULAR | Status: AC | PRN
Start: 2020-06-10 — End: 2020-06-13
  Administered 2020-06-11: 2 mg via INTRAVENOUS
  Administered 2020-06-11: 1 mg via INTRAVENOUS
  Administered 2020-06-13: 2 mg via INTRAVENOUS
  Filled 2020-06-10 (×4): qty 1

## 2020-06-10 MED ORDER — LOSARTAN POTASSIUM 50 MG PO TABS: 50.0000 mg | ORAL_TABLET | Freq: Every day | ORAL | Status: DC

## 2020-06-10 MED ORDER — AMLODIPINE BESYLATE 10 MG PO TABS
10.0000 mg | ORAL_TABLET | Freq: Every day | ORAL | Status: DC
  Administered 2020-06-10 – 2020-06-22 (×13): 10 mg via ORAL
  Filled 2020-06-10 (×13): qty 1

## 2020-06-10 MED ORDER — CLONIDINE HCL 0.1 MG PO TABS
0.1000 mg | ORAL_TABLET | Freq: Three times a day (TID) | ORAL | Status: DC
  Administered 2020-06-10 – 2020-06-11 (×2): 0.1 mg via ORAL
  Filled 2020-06-10 (×3): qty 1

## 2020-06-10 MED ORDER — LORAZEPAM 1 MG PO TABS: 1.0000 mg | ORAL_TABLET | ORAL | Status: AC | PRN

## 2020-06-10 MED ORDER — ADULT MULTIVITAMIN W/MINERALS CH
1.0000 | ORAL_TABLET | Freq: Every day | ORAL | Status: DC
  Administered 2020-06-10 – 2020-06-22 (×13): 1 via ORAL
  Filled 2020-06-10 (×13): qty 1

## 2020-06-10 MED ORDER — LABETALOL HCL 5 MG/ML IV SOLN
5.0000 mg | INTRAVENOUS | Status: DC | PRN
  Administered 2020-06-10: 10 mg via INTRAVENOUS
  Administered 2020-06-11 – 2020-06-12 (×7): 20 mg via INTRAVENOUS
  Filled 2020-06-10 (×8): qty 4

## 2020-06-10 MED ORDER — HYDROCHLOROTHIAZIDE 25 MG PO TABS: 12.5000 mg | ORAL_TABLET | ORAL | Status: DC

## 2020-06-10 MED ORDER — ADULT MULTIVITAMIN W/MINERALS CH: 1.0000 | ORAL_TABLET | Freq: Every day | ORAL | Status: DC

## 2020-06-10 MED ORDER — METOPROLOL TARTRATE 50 MG PO TABS
100.0000 mg | ORAL_TABLET | Freq: Two times a day (BID) | ORAL | Status: DC
  Administered 2020-06-10 – 2020-06-15 (×11): 100 mg via ORAL
  Filled 2020-06-10 (×11): qty 2

## 2020-06-10 MED ORDER — LORAZEPAM 2 MG/ML IJ SOLN
1.0000 mg | INTRAMUSCULAR | Status: DC | PRN
  Administered 2020-06-12 – 2020-06-14 (×3): 2 mg via INTRAVENOUS
  Filled 2020-06-10 (×2): qty 1

## 2020-06-10 MED ORDER — FOLIC ACID 1 MG PO TABS
1.0000 mg | ORAL_TABLET | Freq: Every day | ORAL | Status: DC
  Administered 2020-06-10 – 2020-06-22 (×13): 1 mg via ORAL
  Filled 2020-06-10 (×13): qty 1

## 2020-06-10 NOTE — Progress Notes (Signed)
*  PRELIMINARY RESULTS* Echocardiogram 2D Echocardiogram has been performed.  Neomia Dear RDCS 06/10/2020, 8:23 AM

## 2020-06-10 NOTE — Evaluation (Signed)
Speech Language Pathology Evaluation Patient Details Name: [name] MRN: [id number] DOB: 1965-08-28 Today's Date: 06/10/2020 Time: [id number]    Problem List:  Patient Active Problem List   Diagnosis Date Noted  . ICH (intracerebral hemorrhage) ([place]) 06/09/2020  . Sprain of anterior talofibular ligament of left ankle 01/15/2020  . Hyperlipidemia, on Lipitor 04/01/2017  . Alcohol use disorder, mild, abuse 06/30/2016  . Morbid obesity ([place]) 06/30/2016  . Nonischemic dilated cardiomyopathy ([place]) 11/08/2012  . Chronic combined systolic and diastolic CHF (congestive heart failure) ([place]) 11/08/2012  . HTN (hypertension) 05/25/2012   Past Medical History:  Past Medical History:  Diagnosis Date  . Chronic combined systolic and diastolic CHF (congestive heart failure) (HCC)    EF 25-30%  . Diastolic dysfunction   . Dyslipidemia   . Hyperlipidemia   . Hypertension   . Nonischemic dilated cardiomyopathy ([place])    felt secondary to HTN with no ischemia on nuclear stress test 05/2012, EF 25-30%  . Shortness of breath    Past Surgical History:  Past Surgical History:  Procedure Laterality Date  . KNEE ARTHROSCOPY Left 04/13/2016   Procedure: ARTHROSCOPY KNEE WITH DEBRIDEMENT;  Surgeon: Garald Balding, MD;  Location: [place];  Service: Orthopedics;  Laterality: Left;  . KNEE ARTHROSCOPY WITH MEDIAL MENISECTOMY Right 04/13/2016   Procedure: KNEE ARTHROSCOPY WITH MEDIAL MENISECTOMY;  Surgeon: Garald Balding, MD;  Location: [place];  Service: Orthopedics;  Laterality: Right;  . NASAL FRACTURE SURGERY     HPI:  [name] is a 55 y.o. male with a PMHx of obesity, HTN, HLD, Cs/dHF who presents after being found down CT revealed acute right thalamic hemorrhage with extension to the ventricles. 46m shift.   Assessment / Plan / Recommendation Clinical Impression  Dalton LDileonardois very pleasant displaying cognitive impairments markedy by difficulty  retrieving information, attending to verbal information, verbal problem solving and visual disturbance. Impulsive with responses answering before therapist completed sentence and suspect will carry over into functional activities. Pt's language is intact. Met Dalton Dalton Moodto update re: evals, results and plan. Recommendation post acute is CIR.    SLP Assessment  SLP Recommendation/Assessment: Patient needs continued Speech Lanaguage Pathology Services SLP Visit Diagnosis: Cognitive communication deficit (R41.841)    Follow Up Recommendations  Inpatient Rehab    Frequency and Duration min 2x/week  2 weeks      SLP Evaluation Cognition  Overall Cognitive Status: Impaired/Different from baseline Arousal/Alertness: Awake/alert Orientation Level: Oriented X4 Attention: Sustained Sustained Attention: Impaired Sustained Attention Impairment: Verbal basic Memory: Impaired Memory Impairment: Retrieval deficit Awareness: Impaired Awareness Impairment: Emergent impairment;Anticipatory impairment Problem Solving: Impaired Problem Solving Impairment: Verbal basic Executive Function: Sequencing;Organizing Behaviors: Impulsive Safety/Judgment: Impaired       Comprehension  Auditory Comprehension Overall Auditory Comprehension: Appears within functional limits for tasks assessed Visual Recognition/Discrimination Discrimination: Not tested Reading Comprehension Reading Status:  (TBA)    Expression Expression Primary Mode of Expression: Verbal Verbal Expression Overall Verbal Expression: Appears within functional limits for tasks assessed Pragmatics: No impairment Written Expression Dominant Hand: Right Written Expression:  (TBA)   Oral / Motor  Oral Motor/Sensory Function Overall Oral Motor/Sensory Function: Moderate impairment Facial ROM: Reduced left;Suspected CN VII (facial) dysfunction Facial Symmetry: Abnormal symmetry left;Suspected CN VII (facial) dysfunction Facial  Strength: Reduced left;Suspected CN VII (facial) dysfunction Facial Sensation: Reduced left;Suspected CN V (Trigeminal) dysfunction Lingual ROM: Reduced right;Reduced left;Suspected CN XII (hypoglossal) dysfunction Lingual Symmetry:  (slight to left) Lingual Strength: Reduced;Suspected CN  XII (hypoglossal) dysfunction Mandible: Within Functional Limits Motor Speech Overall Motor Speech: Appears within functional limits for tasks assessed Intelligibility: Intelligible Motor Planning: Witnin functional limits   GO                    Houston Siren 06/10/2020, 11:07 AM  Orbie Pyo Colvin Caroli.Ed Risk analyst [phone] Office [phone]

## 2020-06-10 NOTE — Progress Notes (Signed)
PT Cancellation Note  Patient Details Name: [name] MRN: [id number] DOB: 1965-12-29   Cancelled Treatment:    Reason Eval/Treat Not Completed: Medical issues which prohibited therapy;Active bedrest order remains at this time. In addition, MD requesting therapies hold until BP under control. PT will continue to follow and evaluate as appropriate.   Deland Pretty, DPT   Acute Rehabilitation Department Pager #: [phone]   Gaetana Michaelis 06/10/2020, 9:51 AM

## 2020-06-10 NOTE — Progress Notes (Signed)
STROKE TEAM PROGRESS NOTE   SUBJECTIVE (INTERVAL HISTORY) His Rn and wife are at the bedside.  Overall his condition is stable. Pt sitting in bed, mildly restless, still has tachycardia. Still on cleviprex maxed out for BP management. Now passed swallow, will put on po BP meds. Still on 3% saline. Pending MRI and MRA>    OBJECTIVE Temp:  [97.8 F (36.6 C)-101.2 F (38.4 C)] 99.8 F (37.7 C) (05/19 0800) Pulse Rate:  [48-136] 102 (05/19 0900) Cardiac Rhythm: Normal sinus rhythm (05/19 0800) Resp:  [16-33] 19 (05/19 0900) BP: (103-204)/(65-146) 140/70 (05/19 0800) SpO2:  [76 %-100 %] 97 % (05/19 0900) Weight:  [100.2 kg-113.9 kg] 113.9 kg (05/18 2000)  Recent Labs  Lab 06/09/20 1607  GLUCAP 123*   Recent Labs  Lab 06/09/20 1602 06/09/20 1608 06/09/20 1809 06/09/20 2256 06/10/20 0421 06/10/20 0724  NA 138 138 144 141 143 144  K 4.0 3.9  --   --   --  4.3  CL 102 102  --   --   --  114*  CO2 25  --   --   --   --  24  GLUCOSE 133* 123*  --   --   --  163*  BUN 14 17  --   --   --  16  CREATININE 1.16 0.90  --   --   --  1.68*  CALCIUM 9.6  --   --   --   --  8.5*   Recent Labs  Lab 06/09/20 1602  AST 40  ALT 38  ALKPHOS 66  BILITOT 0.9  PROT 7.6  ALBUMIN 4.2   Recent Labs  Lab 06/09/20 1602 06/09/20 1608 06/10/20 0724  WBC 9.3  --  8.4  NEUTROABS 7.8*  --   --   HGB 13.8 15.0 12.4*  HCT 41.1 44.0 37.4*  MCV 93.4  --  96.6  PLT 260  --  270   No results for input(s): CKTOTAL, CKMB, CKMBINDEX, TROPONINI in the last 168 hours. Recent Labs    06/09/20 1602  LABPROT 12.8  INR 1.0   Recent Labs    06/10/20 0724  COLORURINE YELLOW  LABSPEC 1.019  PHURINE 5.0  GLUCOSEU 50*  HGBUR SMALL*  BILIRUBINUR NEGATIVE  KETONESUR NEGATIVE  PROTEINUR 30*  NITRITE NEGATIVE  LEUKOCYTESUR NEGATIVE       Component Value Date/Time   CHOL 196 06/09/2020 1809   TRIG 332 (H) 06/09/2020 1809   HDL 52 06/09/2020 1809   CHOLHDL 3.8 06/09/2020 1809   VLDL 66 (H)  06/09/2020 1809   LDLCALC 78 06/09/2020 1809   Lab Results  Component Value Date   HGBA1C 5.5 06/09/2020      Component Value Date/Time   LABOPIA NONE DETECTED 06/09/2020 2256   COCAINSCRNUR NONE DETECTED 06/09/2020 2256   LABBENZ NONE DETECTED 06/09/2020 2256   AMPHETMU NONE DETECTED 06/09/2020 2256   THCU NONE DETECTED 06/09/2020 2256   LABBARB NONE DETECTED 06/09/2020 2256    No results for input(s): ETH in the last 168 hours.  I have personally reviewed the radiological images below and agree with the radiology interpretations.  CT HEAD WO CONTRAST  Result Date: 06/09/2020 CLINICAL DATA:  Intracranial hemorrhage follow EXAM: CT HEAD WITHOUT CONTRAST TECHNIQUE: Contiguous axial images were obtained from the base of the skull through the vertex without intravenous contrast. COMPARISON:  06/09/2020 at 4:10 p.m. FINDINGS: Brain: Unchanged appearance of intracranial hemorrhage in the right thalamus with extension into the  ventricles. 4 mm leftward midline shift is also unchanged. Vascular: No abnormal hyperdensity of the major intracranial arteries or dural venous sinuses. No intracranial atherosclerosis. Skull: The visualized skull base, calvarium and extracranial soft tissues are normal. Sinuses/Orbits: No fluid levels or advanced mucosal thickening of the visualized paranasal sinuses. No mastoid or middle ear effusion. The orbits are normal. IMPRESSION: 1. Unchanged appearance of right thalamic intracranial hemorrhage with extension into the ventricles. 2. Unchanged 4 mm leftward midline shift. Electronically Signed   By: Deatra Robinson M.D.   On: 06/09/2020 22:18   CT SOFT TISSUE NECK WO CONTRAST  Result Date: 06/09/2020 CLINICAL DATA:  Foreign body in the neck. EXAM: CT NECK WITHOUT CONTRAST TECHNIQUE: Multidetector CT imaging of the neck was performed following the standard protocol without intravenous contrast. COMPARISON:  None. FINDINGS: PHARYNX AND LARYNX: The nasopharynx,  oropharynx and larynx are normal. Visible portions of the oral cavity, tongue base and floor of mouth are normal. Normal epiglottis, vallecula and pyriform sinuses. The larynx is normal. No retropharyngeal abscess, effusion or lymphadenopathy. SALIVARY GLANDS: Normal parotid, submandibular and sublingual glands. THYROID: Normal. LYMPH NODES: No enlarged or abnormal density lymph nodes. VASCULAR: Major cervical vessels are patent. Left IJ central venous catheter. LIMITED INTRACRANIAL: Normal. VISUALIZED ORBITS: Normal. MASTOIDS AND VISUALIZED PARANASAL SINUSES: No fluid levels or advanced mucosal thickening. No mastoid effusion. SKELETON: No bony spinal canal stenosis. No lytic or blastic lesions. UPPER CHEST: Clear. OTHER: None. IMPRESSION: Normal CT of the neck. Electronically Signed   By: Deatra Robinson M.D.   On: 06/09/2020 22:22   CT CHEST WO CONTRAST  Result Date: 06/09/2020 CLINICAL DATA:  Mediastinal widening on chest radiograph, possible foreign body EXAM: CT CHEST WITHOUT CONTRAST TECHNIQUE: Multidetector CT imaging of the chest was performed following the standard protocol without IV contrast. COMPARISON:  Radiograph 06/09/2020, 01/02/2018, contemporary neck radiograph FINDINGS: Cardiovascular: Left IJ approach central venous catheter tip terminates near the level of the left brachiocephalic-superior caval junction. Cardiac size is top normal no sizable pericardial effusion. Atherosclerotic plaque within the normal caliber aorta. No hyperdense mural thickening to suggest an intra mural hematoma. No periaortic stranding or hemorrhage is evident on this unenhanced exam. Central pulmonary arteries are normal caliber. Luminal evaluation of the vasculature is precluded in the absence of contrast media. Mediastinum/Nodes: Some pronounced upper mediastinal widening seen on the chest radiograph is likely a combination of portable technique and abundant mediastinal fat noted on this exam. No mediastinal fluid or  gas. Normal thyroid gland and thoracic inlet. No acute abnormality of the trachea or esophagus. No worrisome mediastinal or axillary adenopathy. Hilar nodal evaluation is limited in the absence of intravenous contrast media. Lungs/Pleura: Low lung volumes and atelectasis with more bandlike subsegmental atelectasis towards the lung bases. No effusion. No pneumothorax. Upper Abdomen: No acute abnormalities present in the visualized portions of the upper abdomen. Musculoskeletal: No acute osseous abnormality or suspicious osseous lesion. Degenerative changes are present in the imaged spine and shoulders. No worrisome chest wall mass. No unexpected radiodense foreign bodies. IMPRESSION: 1. No expected radiopaque foreign body seen in the chest. 2. Left IJ approach central venous catheter tip terminates at the left brachiocephalic-superior caval junction. 3. Mediastinal widening present on comparison chest radiograph is likely a combination artifactual accentuation due to portable technique and abundant mediastinal fat noted on this exam. No worrisome aortic dilatation, or other acute aortic abnormality within the limitations of an unenhanced CT. 4. Basilar atelectatic changes. Electronically Signed   By: Coralie Keens.D.  On: 06/09/2020 23:26   DG Chest Portable 1 View  Result Date: 06/09/2020 CLINICAL DATA:  55 year old male with history of central line placement. EXAM: PORTABLE CHEST 1 VIEW COMPARISON:  Chest x-ray 01/02/2018. FINDINGS: Left-sided internal jugular central venous catheter with tip terminating near the junction of the innominate vein with the superior vena cava. Lung volumes are low. No consolidative airspace disease. No pleural effusions. No pneumothorax. No evidence of pulmonary edema. Heart size is mildly enlarged. Marked widening of the mediastinum, new compared to the prior examination. IMPRESSION: 1. Left internal jugular central venous catheter in position, as above, with profound widening  of the mediastinum. If there is clinical concern for aortic dissection and/or mediastinal hematoma, further evaluation with chest CTA would be recommended. Electronically Signed   By: Trudie Reed M.D.   On: 06/09/2020 18:11   CT HEAD CODE STROKE WO CONTRAST  Result Date: 06/09/2020 CLINICAL DATA:  Code stroke.  Rightward gaze.  Left arm weakness. EXAM: CT HEAD WITHOUT CONTRAST TECHNIQUE: Contiguous axial images were obtained from the base of the skull through the vertex without intravenous contrast. COMPARISON:  None. FINDINGS: Brain: An acute hemorrhage centered in the right thalamus measures 3.6 x 2.8 x 2.7 cm (approximate volume of 14 mL). There is intraventricular extension with a small amount of hemorrhage in the right greater than left lateral ventricles, third ventricle, and fourth ventricle. The ventricles are not grossly dilated, however prior imaging is not available to no the patient's baseline ventricle size. There is mild edema surrounding the right thalamic hemorrhage. There is 4 mm of leftward midline shift at the level of the thalami. Hypodensities in the cerebral white matter bilaterally are nonspecific but likely reflect mild chronic small vessel ischemic disease given the patient's vascular risk factors. No acute cortically based infarct or extra-axial fluid collection is evident. Vascular: No hyperdense vessel. Skull: No fracture or suspicious osseous lesion. Sinuses/Orbits: Minimal mucosal thickening in the right sphenoid sinus. Clear mastoid air cells. Left cataract extraction. Rightward gaze. Other: None. ASPECTS [facility] Stroke Program Early CT Score) Not scored in the presence of acute hemorrhage. IMPRESSION: 1. 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 2. 4 mm of leftward midline shift. 3. Mild chronic small vessel ischemic disease. These results were communicated to Dr. Wilford Corner at 4:17 pm on 06/09/2020 by text page via the [facility] messaging system. Electronically  Signed   By: Sebastian Ache M.D.   On: 06/09/2020 16:21   [place] EKG SITE RITE  Result Date: 06/09/2020 If Site Rite image not attached, placement could not be confirmed due to current cardiac rhythm.   PHYSICAL EXAM  Temp:  [97.8 F (36.6 C)-101.2 F (38.4 C)] 99.8 F (37.7 C) (05/19 0800) Pulse Rate:  [48-136] 102 (05/19 0900) Resp:  [16-33] 19 (05/19 0900) BP: (103-204)/(65-146) 140/70 (05/19 0800) SpO2:  [76 %-100 %] 97 % (05/19 0900) Weight:  [100.2 kg-113.9 kg] 113.9 kg (05/18 2000)  General - Well nourished, well developed, mildly restless.  Ophthalmologic - fundi not visualized due to noncooperation.  Cardiovascular - Regular rhythm and mild tachycardia.  Neuro - awake, alert, eyes open, orientated to age, place, time and people. No aphasia, fluent language, following all simple commands. Able to name and repeat. No gaze palsy, tracking bilaterally, visual field full, PERRL, however with right gaze nystagmus. Left facial droop, right palpebral fissure smaller. Tongue midline. RUE and RLE 5/5, LUE 2/5 and LLE 3/5, left sensation loss on the face and LUE and LLE, right FTN intact, gait  not tested.     ASSESSMENT/PLAN Mr. Josep Luviano III is a 55 y.o. male with history of hypertension, obesity, cardiomyopathy, hyperlipidemia, CHF admitted for right-sided gaze, right-sided weakness numbness, fall. No tPA given due to ICH.    ICH:  right thalamic ICH with IVH, likely due to uncontrolled hypertension  CT head showed right thalamic ICH with IVH with 4 mm midline shift  Repeat CT head no hematoma expansion  MRI stable right thalamic ICH with IVH and midline shift  MRA no aneurysm or AVM  2D Echo pending  LDL 78  HgbA1c 5.5  UDS negative  SCDs for VTE prophylaxis  No antithrombotic prior to admission, now on No antithrombotic due to ICH  Ongoing aggressive stroke risk factor management  Therapy recommendations: Pending  Disposition: Pending  Cerebral edema  CT and  MRI showed midline shift 32mm  On 3% saline @ 75  Na 141-144-147  Sodium goal 1 50-1 55  Sodium Q6  Hypertensive emergency . Home medication metoprolol 100 twice daily, HCTZ, losartan . Unstable . On maximized Cleviprex . Put on p.o. medication - amlodipine 10, metoprolol 100 twice daily . Avoid HCTZ and losartan at this time due to AKI  Long term BP goal normotensive  Hyperlipidemia  Home meds: Lipitor 40  LDL 78, goal < 70  Now on no statin given recent ICH  Resume statin at discharge  AKI  Baseline creatinine 1.2-1.4  Cre 0.9->1.68  On IVF   Avoid nephrotoxic agents  BMP monitoring  Dysphagia  Speech on board  Now on dys 3 and nectar thick  Aspiration precaution  Low-grade fever  Tmax 101.2->100.2->100  Tachycardia   Blood culture pending  UA WBC 6-10  CT chest basilar atelectasis  Alcohol abuse  About 24 drinks per week  On CIWA protocol  FA/MVI/B1  Other Stroke Risk Factors  Obesity, Body mass index is 36.03 kg/m.   Likely obstructive sleep apnea, undiagnosed    Other Active Problems  ? Right hemifacial spasm  Hospital day # 1  This patient is critically ill due to ICH with IVH, cerebral edema, hypertensive emergency, AKI, dysphagia, alcohol abuse and at significant risk of neurological worsening, death form hematoma expansion, brain herniation, heart failure, DT, seizure, sepsis. This patient's care requires constant monitoring of vital signs, hemodynamics, respiratory and cardiac monitoring, review of multiple databases, neurological assessment, discussion with family, other specialists and medical decision making of high complexity. I spent 40 minutes of neurocritical care time in the care of this patient. I had long discussion with wife at bedside, updated pt current condition, treatment plan and potential prognosis, and answered all the questions.  She expressed understanding and appreciation.    Marvel Plan, MD  PhD Stroke Neurology 06/10/2020 9:43 AM    To contact Stroke Continuity provider, please refer to [email or web address]. After hours, contact General Neurology

## 2020-06-10 NOTE — Evaluation (Signed)
Clinical/Bedside Swallow Evaluation Patient Details  Name: Raiquan Chandler MRN: [id number] Date of Birth: March 11, 1965  Today's Date: 06/10/2020 Time: SLP Start Time (ACUTE ONLY): 0910 SLP Stop Time (ACUTE ONLY): 0925 SLP Time Calculation (min) (ACUTE ONLY): 15 min  Past Medical History:  Past Medical History:  Diagnosis Date  . Chronic combined systolic and diastolic CHF (congestive heart failure) (HCC)    EF 25-30%  . Diastolic dysfunction   . Dyslipidemia   . Hyperlipidemia   . Hypertension   . Nonischemic dilated cardiomyopathy (HCC)    felt secondary to HTN with no ischemia on nuclear stress test 05/2012, EF 25-30%  . Shortness of breath    Past Surgical History:  Past Surgical History:  Procedure Laterality Date  . KNEE ARTHROSCOPY Left 04/13/2016   Procedure: ARTHROSCOPY KNEE WITH DEBRIDEMENT;  Surgeon: Valeria Batman, MD;  Location: [facility];  Service: Orthopedics;  Laterality: Left;  . KNEE ARTHROSCOPY WITH MEDIAL MENISECTOMY Right 04/13/2016   Procedure: KNEE ARTHROSCOPY WITH MEDIAL MENISECTOMY;  Surgeon: Valeria Batman, MD;  Location: [facility];  Service: Orthopedics;  Laterality: Right;  . NASAL FRACTURE SURGERY     HPI:  Graden Hoshino III is a 55 y.o. male with a PMHx of obesity, HTN, HLD, Cs/dHF who presents after being found down CT revealed acute right thalamic hemorrhage with extension to the ventricles. 75mm shift.   Assessment / Plan / Recommendation Clinical Impression  Pt passed Yale swallow screen then demonstrated + s/s aspiration with thin liquids during speech-cognitive eval with this therapist. Immediate cough with water on multiple trials despite intake via straw or cup not present with nectar thick liquid cups presentations. There were no overt concerns with mastication, transit or clearance of solid. Anticipate possible pocketing and fatigue with regular texture, therefore recommend Dys 2 (minced), nectar, pills whole in  puree, smaller sips via cup and follow up for likely instrumenatl assessment. SLP Visit Diagnosis: Dysphagia, unspecified (R13.10)    Aspiration Risk  Mild aspiration risk;Moderate aspiration risk    Diet Recommendation Dysphagia 2 (Fine chop);Nectar-thick liquid   Liquid Administration via: Cup;No straw Medication Administration: Whole meds with puree Supervision: Staff to assist with self feeding;Intermittent supervision to cue for compensatory strategies Compensations: Slow rate;Small sips/bites;Lingual sweep for clearance of pocketing Postural Changes: Seated upright at 90 degrees    Other  Recommendations Oral Care Recommendations: Oral care BID   Follow up Recommendations Inpatient Rehab      Frequency and Duration min 2x/week  2 weeks       Prognosis Prognosis for Safe Diet Advancement: Good      Swallow Study   General Date of Onset: 06/09/20 HPI: Braylyn Kalter III is a 55 y.o. male with a PMHx of obesity, HTN, HLD, Cs/dHF who presents after being found down CT revealed acute right thalamic hemorrhage with extension to the ventricles. 1mm shift. Type of Study: Bedside Swallow Evaluation Previous Swallow Assessment:  (none) Diet Prior to this Study: NPO Temperature Spikes Noted: No Respiratory Status: Room air History of Recent Intubation: No Behavior/Cognition: Alert;Impulsive;Cooperative;Pleasant mood;Requires cueing Oral Cavity Assessment: Within Functional Limits Oral Care Completed by SLP: No Oral Cavity - Dentition: Adequate natural dentition Vision:  (suspect need assist) Self-Feeding Abilities: Needs assist Patient Positioning: Upright in bed Baseline Vocal Quality: Normal Volitional Cough: Weak Volitional Swallow: Able to elicit    Oral/Motor/Sensory Function Overall Oral Motor/Sensory Function: Moderate impairment Facial ROM: Reduced left;Suspected CN VII (facial) dysfunction Facial Symmetry: Abnormal symmetry left;Suspected CN  VII (facial)  dysfunction Facial Strength: Reduced left;Suspected CN VII (facial) dysfunction Facial Sensation: Reduced left;Suspected CN V (Trigeminal) dysfunction Lingual ROM: Reduced right;Reduced left;Suspected CN XII (hypoglossal) dysfunction Lingual Symmetry:  (slight to left) Lingual Strength: Reduced;Suspected CN XII (hypoglossal) dysfunction Mandible: Within Functional Limits   Ice Chips Ice chips: Not tested   Thin Liquid Thin Liquid: Impaired Presentation: Cup;Straw Pharyngeal  Phase Impairments: Cough - Immediate    Nectar Thick Nectar Thick Liquid: Within functional limits Presentation: Cup   Honey Thick Honey Thick Liquid: Not tested   Puree Puree: Within functional limits   Solid     Solid: Impaired Oral Phase Impairments: Reduced lingual movement/coordination Oral Phase Functional Implications:  (labial residue)      Royce Macadamia 06/10/2020,11:25 AM    Breck Coons Lonell Face.Ed Nurse, children's [phone] Office [phone]

## 2020-06-10 NOTE — Progress Notes (Signed)
Carotid artery duplex has been completed. Preliminary results can be found in CV Proc through chart review.   06/10/20 4:13 PM Olen Cordial RVT

## 2020-06-10 NOTE — Progress Notes (Signed)
OT Cancellation Note  Patient Details Name: [name] MRN: [id number] DOB: 1965-07-13   Cancelled Treatment:    Reason Eval/Treat Not Completed: Active bedrest order (Will assess once activity order updated as schedule allows.)  [facility] Rubina Basinski, OT/L   Acute OT Clinical Specialist Acute Rehabilitation Services Pager [phone] Office [phone]  06/10/2020, 9:12 AM

## 2020-06-10 NOTE — TOC CAGE-AID Note (Signed)
Transition of Care [facility]) - CAGE-AID Screening   Patient Details  Name: [name] MRN: [id number] Date of Birth: 31-Mar-1965  [facility] CM/SW Contact:    Bethann Berkshire, [place] Phone Number: 06/10/2020, 4:02 PM   Clinical Narrative:  CSW met with pt and complete CAGE-AID; score of 3. Pt reports he drinks a 6 pack of beer (high alcohol % malt liquor) daily and has been doing so for the past 10 years. He does not use any other substances. Pt reports he was abstinent from alcohol use for 3 years at some point starting in 1999. He indicated that his motivation for sobriety at that time was due to his mother dying and knowing that she would want him to be sober. Pt states that he has been thinking about quitting alcohol use and that his motivation this time are his health problems.   Pt has been to [facility] residential tx in the past about 5 years ago and indicates some outpatient treatment. He reports he also participated in [facility] at some point. Pt is agreeable to receiving substance use tx resources and does not have any questions about resources. CSW inquires if pt has any supports from [place] he could reach out to. He reports he has a friend from [place] visiting him today and that his friend plans to take him to an [place] meeting this Saturday. Pt reports no other needs at this time. He is aware to ask for CSW staff If he has any needs.   CAGE-AID Screening:    Have You Ever Felt You Ought to Cut Down on Your Drinking or Drug Use?: Yes Have People Annoyed You By Critizing Your Drinking Or Drug Use?: No Have You Felt Bad Or Guilty About Your Drinking Or Drug Use?: Yes Have You Ever Had a Drink or Used Drugs First Thing In The Morning to Steady Your Nerves or to Get Rid of a Hangover?: Yes CAGE-AID Score: 3  Substance Abuse Education Offered: Yes  Substance abuse interventions: Patient Counseling,Educational Materials

## 2020-06-11 ENCOUNTER — Inpatient Hospital Stay (HOSPITAL_COMMUNITY): Payer: 59

## 2020-06-11 DIAGNOSIS — I615 Nontraumatic intracerebral hemorrhage, intraventricular: Secondary | ICD-10-CM | POA: Diagnosis not present

## 2020-06-11 DIAGNOSIS — I610 Nontraumatic intracerebral hemorrhage in hemisphere, subcortical: Secondary | ICD-10-CM | POA: Diagnosis not present

## 2020-06-11 DIAGNOSIS — G936 Cerebral edema: Secondary | ICD-10-CM

## 2020-06-11 LAB — CBC
HCT: 36.6 % — ABNORMAL LOW (ref 39.0–52.0)
Hemoglobin: 12.2 g/dL — ABNORMAL LOW (ref 13.0–17.0)
MCH: 32.4 pg (ref 26.0–34.0)
MCHC: 33.3 g/dL (ref 30.0–36.0)
MCV: 97.1 fL (ref 80.0–100.0)
Platelets: 278 10*3/uL (ref 150–400)
RBC: 3.77 MIL/uL — ABNORMAL LOW (ref 4.22–5.81)
RDW: 12.6 % (ref 11.5–15.5)
WBC: 10.9 10*3/uL — ABNORMAL HIGH (ref 4.0–10.5)
nRBC: 0 % (ref 0.0–0.2)

## 2020-06-11 LAB — BASIC METABOLIC PANEL
Anion gap: 6 (ref 5–15)
BUN: 7 mg/dL (ref 6–20)
CO2: 23 mmol/L (ref 22–32)
Calcium: 8.5 mg/dL — ABNORMAL LOW (ref 8.9–10.3)
Chloride: 113 mmol/L — ABNORMAL HIGH (ref 98–111)
Creatinine, Ser: 1.19 mg/dL (ref 0.61–1.24)
GFR, Estimated: >60 mL/min (ref >60)
Glucose, Bld: 146 mg/dL — ABNORMAL HIGH (ref 70–99)
Potassium: 4.1 mmol/L (ref 3.5–5.1)
Sodium: 142 mmol/L (ref 135–145)

## 2020-06-11 LAB — SODIUM
Sodium: 141 mmol/L (ref 135–145)
Sodium: 143 mmol/L (ref 135–145)
Sodium: 142 mmol/L (ref 135–145)

## 2020-06-11 MED ORDER — SODIUM CHLORIDE 3 % IV SOLN
INTRAVENOUS | Status: AC
  Filled 2020-06-11 (×2): qty 500

## 2020-06-11 MED ORDER — LOSARTAN POTASSIUM 50 MG PO TABS
50.0000 mg | ORAL_TABLET | Freq: Two times a day (BID) | ORAL | Status: DC
  Administered 2020-06-11 – 2020-06-22 (×22): 50 mg via ORAL
  Filled 2020-06-11 (×22): qty 1

## 2020-06-11 MED ORDER — FUROSEMIDE 10 MG/ML IJ SOLN
40.0000 mg | Freq: Once | INTRAMUSCULAR | Status: AC
  Administered 2020-06-11: 40 mg via INTRAVENOUS
  Filled 2020-06-11: qty 4

## 2020-06-11 MED ORDER — HYDROCHLOROTHIAZIDE 25 MG PO TABS
25.0000 mg | ORAL_TABLET | ORAL | Status: DC
  Administered 2020-06-11 – 2020-06-17 (×5): 25 mg via ORAL
  Filled 2020-06-11 (×7): qty 1

## 2020-06-11 MED ORDER — CLONIDINE HCL 0.2 MG PO TABS
0.2000 mg | ORAL_TABLET | Freq: Three times a day (TID) | ORAL | Status: DC
  Administered 2020-06-11 – 2020-06-12 (×3): 0.2 mg via ORAL
  Filled 2020-06-11 (×3): qty 1

## 2020-06-11 NOTE — Progress Notes (Signed)
  Speech Language Pathology Treatment: Dysphagia;Cognitive-Linquistic  Patient Details Name: [name] MRN: [id number] DOB: 04/26/1965 Today's Date: 06/11/2020 Time: 5053-9767 SLP Time Calculation (min) (ACUTE ONLY): 24 min  Assessment / Plan / Recommendation Clinical Impression  [name] demonstrated improved swallow function during challenged trials with cup/straw sips thin over extended period and increased volume. He did not cough or exhibit throat clears; wheezing noted at end of session. Left facial sensation impacted awareness of cracker on lip over several trials. Visual feedback with smart phone effective for removal. Advised him to use tongue or manually check for pocketing. Therapist will continue Dys 2 texture and upgrade liquids to thin cup or straw, pills whole in puree. He is at risk for occasional penetration/aspiration if vocalizing during mastication or immediately following swallow.  He is aware of visual impairments and was not oriented to place this am. He has family in [place] and thought he was there and after cueing with 5 min delay (accurate at end of session). Able to locate call bell with min assist and locate help button independently. Will focus on use of environmental cues and compensatory strategies to locate important items in room. [name] is impulsive with responses and suspect may be from motor aspect. Continue tx to maximize independence with swallow and cognition.    HPI HPI: [name] is a 55 y.o. male with a PMHx of obesity, HTN, ETOH abuse ( pk/day x 10 yrs), HLD, Cs/dHF who presents after being found down CT revealed acute right thalamic hemorrhage with extension to the ventricles. 40mm shift.      SLP Plan  Continue with current plan of care       Recommendations  Diet recommendations: Dysphagia 2 (fine chop);Thin liquid Liquids provided via: Cup;Straw Medication Administration: Whole meds with puree Supervision: Patient able to self  feed;Staff to assist with self feeding Compensations: Slow rate;Small sips/bites;Lingual sweep for clearance of pocketing Postural Changes and/or Swallow Maneuvers: Seated upright 90 degrees;Upright 30-60 min after meal                General recommendations: Rehab consult Oral Care Recommendations: Oral care BID Follow up Recommendations: Inpatient Rehab SLP Visit Diagnosis: Dysphagia, unspecified (R13.10);Cognitive communication deficit (H41.937) Plan: Continue with current plan of care                       [name] 06/11/2020, 9:35 AM   [name] [name].Ed Nurse, children's [phone] Office [phone]

## 2020-06-11 NOTE — Progress Notes (Signed)
STROKE TEAM PROGRESS NOTE   SUBJECTIVE (INTERVAL HISTORY) His Rn and wife are at the bedside.  Overall his condition is stable. Pt lying in bed, mildly sleepy but arousable easily. Still on cleviprex and 3% saline. Cre much improved, resume diuretics. Mild SOB, concerning for fluid overload, will give lasix one dose. Repeat CT in am   OBJECTIVE Temp:  [98.5 F (36.9 C)-100.3 F (37.9 C)] 100.1 F (37.8 C) (05/20 1200) Pulse Rate:  [81-117] 94 (05/20 1315) Cardiac Rhythm: Sinus tachycardia (05/19 2000) Resp:  [16-34] 27 (05/20 1315) BP: (127-177)/(66-93) 149/76 (05/20 1315) SpO2:  [89 %-100 %] 96 % (05/20 1315)  Recent Labs  Lab 06/09/20 1607  GLUCAP 123*   Recent Labs  Lab 06/09/20 1602 06/09/20 1608 06/09/20 1809 06/10/20 0724 06/10/20 1658 06/10/20 2158 06/11/20 0433 06/11/20 1030  NA 138 138   < > 144 147* 145 142 141  K 4.0 3.9  --  4.3  --   --  4.1  --   CL 102 102  --  114*  --   --  113*  --   CO2 25  --   --  24  --   --  23  --   GLUCOSE 133* 123*  --  163*  --   --  146*  --   BUN 14 17  --  16  --   --  7  --   CREATININE 1.16 0.90  --  1.68*  --   --  1.19  --   CALCIUM 9.6  --   --  8.5*  --   --  8.5*  --    < > = values in this interval not displayed.   Recent Labs  Lab 06/09/20 1602  AST 40  ALT 38  ALKPHOS 66  BILITOT 0.9  PROT 7.6  ALBUMIN 4.2   Recent Labs  Lab 06/09/20 1602 06/09/20 1608 06/10/20 0724 06/11/20 0433  WBC 9.3  --  8.4 10.9*  NEUTROABS 7.8*  --   --   --   HGB 13.8 15.0 12.4* 12.2*  HCT 41.1 44.0 37.4* 36.6*  MCV 93.4  --  96.6 97.1  PLT 260  --  270 278   No results for input(s): CKTOTAL, CKMB, CKMBINDEX, TROPONINI in the last 168 hours. Recent Labs    06/09/20 1602  LABPROT 12.8  INR 1.0   Recent Labs    06/10/20 0724  COLORURINE YELLOW  LABSPEC 1.019  PHURINE 5.0  GLUCOSEU 50*  HGBUR SMALL*  BILIRUBINUR NEGATIVE  KETONESUR NEGATIVE  PROTEINUR 30*  NITRITE NEGATIVE  LEUKOCYTESUR NEGATIVE        Component Value Date/Time   CHOL 196 06/09/2020 1809   TRIG 332 (H) 06/09/2020 1809   HDL 52 06/09/2020 1809   CHOLHDL 3.8 06/09/2020 1809   VLDL 66 (H) 06/09/2020 1809   LDLCALC 78 06/09/2020 1809   Lab Results  Component Value Date   HGBA1C 5.5 06/09/2020      Component Value Date/Time   LABOPIA NONE DETECTED 06/09/2020 2256   COCAINSCRNUR NONE DETECTED 06/09/2020 2256   LABBENZ NONE DETECTED 06/09/2020 2256   AMPHETMU NONE DETECTED 06/09/2020 2256   THCU NONE DETECTED 06/09/2020 2256   LABBARB NONE DETECTED 06/09/2020 2256    No results for input(s): ETH in the last 168 hours.  I have personally reviewed the radiological images below and agree with the radiology interpretations.  CT HEAD WO CONTRAST  Result Date: 06/09/2020 CLINICAL DATA:  Intracranial hemorrhage follow EXAM: CT HEAD WITHOUT CONTRAST TECHNIQUE: Contiguous axial images were obtained from the base of the skull through the vertex without intravenous contrast. COMPARISON:  06/09/2020 at 4:10 p.m. FINDINGS: Brain: Unchanged appearance of intracranial hemorrhage in the right thalamus with extension into the ventricles. 4 mm leftward midline shift is also unchanged. Vascular: No abnormal hyperdensity of the major intracranial arteries or dural venous sinuses. No intracranial atherosclerosis. Skull: The visualized skull base, calvarium and extracranial soft tissues are normal. Sinuses/Orbits: No fluid levels or advanced mucosal thickening of the visualized paranasal sinuses. No mastoid or middle ear effusion. The orbits are normal. IMPRESSION: 1. Unchanged appearance of right thalamic intracranial hemorrhage with extension into the ventricles. 2. Unchanged 4 mm leftward midline shift. Electronically Signed   By: Deatra Robinson M.D.   On: 06/09/2020 22:18   CT SOFT TISSUE NECK WO CONTRAST  Result Date: 06/09/2020 CLINICAL DATA:  Foreign body in the neck. EXAM: CT NECK WITHOUT CONTRAST TECHNIQUE: Multidetector CT imaging of  the neck was performed following the standard protocol without intravenous contrast. COMPARISON:  None. FINDINGS: PHARYNX AND LARYNX: The nasopharynx, oropharynx and larynx are normal. Visible portions of the oral cavity, tongue base and floor of mouth are normal. Normal epiglottis, vallecula and pyriform sinuses. The larynx is normal. No retropharyngeal abscess, effusion or lymphadenopathy. SALIVARY GLANDS: Normal parotid, submandibular and sublingual glands. THYROID: Normal. LYMPH NODES: No enlarged or abnormal density lymph nodes. VASCULAR: Major cervical vessels are patent. Left IJ central venous catheter. LIMITED INTRACRANIAL: Normal. VISUALIZED ORBITS: Normal. MASTOIDS AND VISUALIZED PARANASAL SINUSES: No fluid levels or advanced mucosal thickening. No mastoid effusion. SKELETON: No bony spinal canal stenosis. No lytic or blastic lesions. UPPER CHEST: Clear. OTHER: None. IMPRESSION: Normal CT of the neck. Electronically Signed   By: Deatra Robinson M.D.   On: 06/09/2020 22:22   CT CHEST WO CONTRAST  Result Date: 06/09/2020 CLINICAL DATA:  Mediastinal widening on chest radiograph, possible foreign body EXAM: CT CHEST WITHOUT CONTRAST TECHNIQUE: Multidetector CT imaging of the chest was performed following the standard protocol without IV contrast. COMPARISON:  Radiograph 06/09/2020, 01/02/2018, contemporary neck radiograph FINDINGS: Cardiovascular: Left IJ approach central venous catheter tip terminates near the level of the left brachiocephalic-superior caval junction. Cardiac size is top normal no sizable pericardial effusion. Atherosclerotic plaque within the normal caliber aorta. No hyperdense mural thickening to suggest an intra mural hematoma. No periaortic stranding or hemorrhage is evident on this unenhanced exam. Central pulmonary arteries are normal caliber. Luminal evaluation of the vasculature is precluded in the absence of contrast media. Mediastinum/Nodes: Some pronounced upper mediastinal  widening seen on the chest radiograph is likely a combination of portable technique and abundant mediastinal fat noted on this exam. No mediastinal fluid or gas. Normal thyroid gland and thoracic inlet. No acute abnormality of the trachea or esophagus. No worrisome mediastinal or axillary adenopathy. Hilar nodal evaluation is limited in the absence of intravenous contrast media. Lungs/Pleura: Low lung volumes and atelectasis with more bandlike subsegmental atelectasis towards the lung bases. No effusion. No pneumothorax. Upper Abdomen: No acute abnormalities present in the visualized portions of the upper abdomen. Musculoskeletal: No acute osseous abnormality or suspicious osseous lesion. Degenerative changes are present in the imaged spine and shoulders. No worrisome chest wall mass. No unexpected radiodense foreign bodies. IMPRESSION: 1. No expected radiopaque foreign body seen in the chest. 2. Left IJ approach central venous catheter tip terminates at the left brachiocephalic-superior caval junction. 3. Mediastinal widening present on comparison chest radiograph  is likely a combination artifactual accentuation due to portable technique and abundant mediastinal fat noted on this exam. No worrisome aortic dilatation, or other acute aortic abnormality within the limitations of an unenhanced CT. 4. Basilar atelectatic changes. Electronically Signed   By: Kreg Shropshire M.D.   On: 06/09/2020 23:26   MR ANGIO HEAD WO CONTRAST  Result Date: 06/10/2020 CLINICAL DATA:  Cerebral hemorrhage. EXAM: MRA HEAD WITHOUT CONTRAST TECHNIQUE: Angiographic images of the Circle of Willis were acquired using MRA technique without intravenous contrast. COMPARISON:  No pertinent prior exam. FINDINGS: Motion limited exam.  Within this limitation: Anterior circulation: No large vessel occlusion or evidence of proximal hemodynamically significant stenosis. No evidence of hemodynamically significant proximal stenosis, although evaluation  limited by motion. No aneurysm identified. No evidence of vascular malformation in the region of the right thalamic hemorrhage, although acute blood products limit evaluation. There is intrinsic T1 hyperintensity within the hemorrhage, which also limits evaluation. Posterior circulation: No large vessel occlusion or evidence of proximal hemodynamically significant stenosis. Bilateral posterior communicating arteries. No aneurysm. IMPRESSION: 1. No evidence of a vascular malformation in the region of the right thalamic hemorrhage, although acute blood products limit evaluation. 2. No large vessel occlusion or evidence of proximal hemodynamically significant stenosis on this motion limited exam. Electronically Signed   By: Feliberto Harts MD   On: 06/10/2020 12:02   MR BRAIN W WO CONTRAST  Result Date: 06/10/2020 CLINICAL DATA:  Hemorrhage. EXAM: MRI HEAD WITHOUT AND WITH CONTRAST TECHNIQUE: Multiplanar, multiecho pulse sequences of the brain and surrounding structures were obtained without and with intravenous contrast. CONTRAST:  10mL GADAVIST GADOBUTROL 1 MMOL/ML IV SOLN COMPARISON:  CT Jun 09, 2020. FINDINGS: Mildly motion limited exam. Brain: When comparing across modalities, no substantial change in size/extent of an acute right thalamic hemorrhage, measuring up to approximately 3.8 by 2.4 cm. The hemorrhage demonstrates areas of intrinsic T1 hyperintensity. Similar extraventricular extension with blood products in right greater than left lateral ventricles, third ventricle, and fourth ventricle. No progressive ventriculomegaly. Surrounding edema with similar 4 mm of leftward midline shift. Outside of the hemorrhage itself, and no confluent restricted diffusion to suggest acute infarct. No visible mass lesion, although acute blood products limit evaluation. Moderate patchy T2/FLAIR hyperintensities within the white matter and pons, nonspecific but most likely related to chronic microvascular ischemic  disease. Vascular: Further evaluated on concurrent MRA. Skull and upper cervical spine: Normal marrow signal. Sinuses/Orbits: Clear sinuses.  No acute orbital finding. Other: No mastoid effusions. IMPRESSION: 1. When comparing across modalities, no substantial change in size/extent of an acute right thalamic hemorrhage with intraventricular extension, as detailed above. Similar 4 mm of leftward midline shift. No progressive ventriculomegaly. 2. No specific evidence of acute infarct or visible mass lesion, although acute blood products limits evaluation. A follow-up MRI after resolution of hemorrhage could further evaluate if clinically indicated. 3. Moderate chronic microvascular ischemic disease. Electronically Signed   By: Feliberto Harts MD   On: 06/10/2020 11:51   DG Chest Portable 1 View  Result Date: 06/09/2020 CLINICAL DATA:  55 year old male with history of central line placement. EXAM: PORTABLE CHEST 1 VIEW COMPARISON:  Chest x-ray 01/02/2018. FINDINGS: Left-sided internal jugular central venous catheter with tip terminating near the junction of the innominate vein with the superior vena cava. Lung volumes are low. No consolidative airspace disease. No pleural effusions. No pneumothorax. No evidence of pulmonary edema. Heart size is mildly enlarged. Marked widening of the mediastinum, new compared to the prior examination.  IMPRESSION: 1. Left internal jugular central venous catheter in position, as above, with profound widening of the mediastinum. If there is clinical concern for aortic dissection and/or mediastinal hematoma, further evaluation with chest CTA would be recommended. Electronically Signed   By: Trudie Reed M.D.   On: 06/09/2020 18:11   ECHOCARDIOGRAM COMPLETE  Result Date: 06/10/2020    ECHOCARDIOGRAM REPORT   Patient Name:   Carron Jaggi [name] Date of Exam: 06/10/2020 Medical Rec #:  [id number]      Height:       70.0 in Accession #:    [id number]     Weight:       251.1 lb Date of  Birth:  25-Aug-1965      BSA:          2.299 m Patient Age:    54 years       BP:           140/71 mmHg Patient Gender: M              HR:           102 bpm. Exam Location:  Inpatient Procedure: 2D Echo, Color Doppler and Cardiac Doppler Indications:    CVA  History:        Patient has prior history of Echocardiogram examinations, most                 recent 08/12/2014. Cardiomyopathy and CHF,                 Signs/Symptoms:Shortness of Breath; Risk Factors:Hypertension,                 Dyslipidemia and Diabetes.  Sonographer:    Neomia Dear RDCS Referring Phys: [id number] ASHISH ARORA  Sonographer Comments: Patient is morbidly obese and Technically difficult study due to poor echo windows. IMPRESSIONS  1. Left ventricular ejection fraction, by estimation, is 65 to 70%. The left ventricle has normal function. The left ventricle has no regional wall motion abnormalities. There is severe left ventricular hypertrophy. Indeterminate diastolic filling due to E-A fusion.  2. Right ventricular systolic function is normal. The right ventricular size is normal. There is normal pulmonary artery systolic pressure. The estimated right ventricular systolic pressure is 28.4 mmHg.  3. The mitral valve is normal in structure. Trivial mitral valve regurgitation. No evidence of mitral stenosis.  4. The aortic valve is normal in structure. Aortic valve regurgitation is trivial. No aortic stenosis is present.  5. The inferior vena cava is normal in size with greater than 50% respiratory variability, suggesting right atrial pressure of 3 mmHg.  6. Increased flow velocities may be secondary to anemia, thyrotoxicosis, hyperdynamic or high flow state. Conclusion(s)/Recommendation(s): No intracardiac source of embolism detected on this transthoracic study. A transesophageal echocardiogram is recommended to exclude cardiac source of embolism if clinically indicated. FINDINGS  Left Ventricle: Left ventricular ejection fraction, by estimation, is  65 to 70%. The left ventricle has normal function. The left ventricle has no regional wall motion abnormalities. The left ventricular internal cavity size was normal in size. There is  severe left ventricular hypertrophy. Indeterminate diastolic filling due to E-A fusion. Right Ventricle: The right ventricular size is normal. No increase in right ventricular wall thickness. Right ventricular systolic function is normal. There is normal pulmonary artery systolic pressure. The tricuspid regurgitant velocity is 2.52 m/s, and  with an assumed right atrial pressure of 3 mmHg, the estimated right ventricular systolic pressure is 28.4 mmHg. Left Atrium:  Left atrial size was normal in size. Right Atrium: Right atrial size was normal in size. Pericardium: There is no evidence of pericardial effusion. Mitral Valve: The mitral valve is normal in structure. Trivial mitral valve regurgitation. No evidence of mitral valve stenosis. MV peak gradient, 13.0 mmHg. The mean mitral valve gradient is 4.0 mmHg. Tricuspid Valve: The tricuspid valve is normal in structure. Tricuspid valve regurgitation is trivial. No evidence of tricuspid stenosis. Aortic Valve: The aortic valve is normal in structure. Aortic valve regurgitation is trivial. No aortic stenosis is present. Aortic valve mean gradient measures 7.0 mmHg. Aortic valve peak gradient measures 13.8 mmHg. Aortic valve area, by VTI measures 3.77 cm. Pulmonic Valve: The pulmonic valve was not well visualized. Pulmonic valve regurgitation is trivial. No evidence of pulmonic stenosis. Aorta: The aortic root is normal in size and structure. Venous: The inferior vena cava is normal in size with greater than 50% respiratory variability, suggesting right atrial pressure of 3 mmHg. IAS/Shunts: No atrial level shunt detected by color flow Doppler.  LEFT VENTRICLE PLAX 2D LVIDd:         4.00 cm     Diastology LVIDs:         2.00 cm     LV e' medial:    4.57 cm/s LV PW:         1.70 cm     LV  E/e' medial:  16.3 LV IVS:        1.60 cm     LV e' lateral:   7.83 cm/s LVOT diam:     2.30 cm     LV E/e' lateral: 9.5 LV SV:         106 LV SV Index:   46 LVOT Area:     4.15 cm  LV Volumes (MOD) LV vol d, MOD A2C: 78.9 ml LV vol d, MOD A4C: 93.0 ml LV vol s, MOD A2C: 23.9 ml LV vol s, MOD A4C: 22.9 ml LV SV MOD A2C:     55.0 ml LV SV MOD A4C:     93.0 ml LV SV MOD BP:      63.6 ml RIGHT VENTRICLE RV Basal diam:  3.70 cm RV Mid diam:    2.90 cm RV S prime:     25.20 cm/s TAPSE (M-mode): 3.7 cm LEFT ATRIUM             Index       RIGHT ATRIUM           Index LA diam:        3.20 cm 1.39 cm/m  RA Area:     13.30 cm LA Vol (A2C):   65.6 ml 28.53 ml/m RA Volume:   31.10 ml  13.53 ml/m LA Vol (A4C):   59.8 ml 26.01 ml/m LA Biplane Vol: 63.6 ml 27.66 ml/m  AORTIC VALVE                    PULMONIC VALVE AV Area (Vmax):    3.19 cm     PV Vmax:       1.64 m/s AV Area (Vmean):   3.23 cm     PV Vmean:      113.000 cm/s AV Area (VTI):     3.77 cm     PV VTI:        0.256 m AV Vmax:           186.00 cm/s  PV Peak grad:  10.8 mmHg AV  Vmean:          124.000 cm/s PV Mean grad:  6.0 mmHg AV VTI:            0.282 m AV Peak Grad:      13.8 mmHg AV Mean Grad:      7.0 mmHg LVOT Vmax:         143.00 cm/s LVOT Vmean:        96.300 cm/s LVOT VTI:          0.256 m LVOT/AV VTI ratio: 0.91  AORTA Ao Root diam: 3.20 cm Ao Asc diam:  3.20 cm Ao Arch diam: 3.2 cm MITRAL VALVE                TRICUSPID VALVE MV Area (PHT): 4.63 cm     TR Peak grad:   25.4 mmHg MV Area VTI:   3.39 cm     TR Vmax:        252.00 cm/s MV Peak grad:  13.0 mmHg MV Mean grad:  4.0 mmHg     SHUNTS MV Vmax:       1.80 m/s     Systemic VTI:  0.26 m MV Vmean:      94.4 cm/s    Systemic Diam: 2.30 cm MV Decel Time: 164 msec MV E velocity: 74.30 cm/s MV A velocity: 106.00 cm/s MV E/A ratio:  0.70 Weston Brass MD Electronically signed by Weston Brass MD Signature Date/Time: 06/10/2020/11:44:08 AM    Final    CT HEAD CODE STROKE WO CONTRAST  Result  Date: 06/09/2020 CLINICAL DATA:  Code stroke.  Rightward gaze.  Left arm weakness. EXAM: CT HEAD WITHOUT CONTRAST TECHNIQUE: Contiguous axial images were obtained from the base of the skull through the vertex without intravenous contrast. COMPARISON:  None. FINDINGS: Brain: An acute hemorrhage centered in the right thalamus measures 3.6 x 2.8 x 2.7 cm (approximate volume of 14 mL). There is intraventricular extension with a small amount of hemorrhage in the right greater than left lateral ventricles, third ventricle, and fourth ventricle. The ventricles are not grossly dilated, however prior imaging is not available to no the patient's baseline ventricle size. There is mild edema surrounding the right thalamic hemorrhage. There is 4 mm of leftward midline shift at the level of the thalami. Hypodensities in the cerebral white matter bilaterally are nonspecific but likely reflect mild chronic small vessel ischemic disease given the patient's vascular risk factors. No acute cortically based infarct or extra-axial fluid collection is evident. Vascular: No hyperdense vessel. Skull: No fracture or suspicious osseous lesion. Sinuses/Orbits: Minimal mucosal thickening in the right sphenoid sinus. Clear mastoid air cells. Left cataract extraction. Rightward gaze. Other: None. ASPECTS [facility] Stroke Program Early CT Score) Not scored in the presence of acute hemorrhage. IMPRESSION: 1. 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 2. 4 mm of leftward midline shift. 3. Mild chronic small vessel ischemic disease. These results were communicated to Dr. Wilford Corner at 4:17 pm on 06/09/2020 by text page via the [facility] messaging system. Electronically Signed   By: Sebastian Ache M.D.   On: 06/09/2020 16:21   VAS US CAROTID  Result Date: 06/10/2020 Carotid Arterial Duplex Study Patient Name:  Kyran Connaughton [name]  Date of Exam:   06/10/2020 Medical Rec #: [id number]       Accession #:    [id number] Date of Birth: 04-10-1965        Patient Gender: M Patient Age:   54Y [facility]am [facility] Procedure:      VAS US CAROTID Referring Phys: [id number] [facility] Delshawn Stech --------------------------------------------------------------------------------  Indications:       CVA. Risk Factors:      Hypertension, hyperlipidemia. Limitations        Today's exam was limited due to the body habitus of the                    patient, the patient's respiratory variation, patient                    movement, patient somnolence and a central line. Comparison Study:  No prior studies. Performing Technologist: Chanda BusingGregory Collins RVT  Examination Guidelines: A complete evaluation includes B-mode imaging, spectral Doppler, color Doppler, and power Doppler as needed of all accessible portions of each vessel. Bilateral testing is considered an integral part of a complete examination. Limited examinations for reoccurring indications may be performed as noted.  Right Carotid Findings: +----------+--------+--------+--------+-----------------------+--------+           PSV cm/sEDV cm/sStenosisPlaque Description     Comments +----------+--------+--------+--------+-----------------------+--------+ CCA Prox  101     12                                              +----------+--------+--------+--------+-----------------------+--------+ CCA Distal82      11              smooth and heterogenous         +----------+--------+--------+--------+-----------------------+--------+ ICA Prox  58      18              smooth and heterogenous         +----------+--------+--------+--------+-----------------------+--------+ ICA Distal57      17                                     tortuous +----------+--------+--------+--------+-----------------------+--------+ ECA       136     19                                              +----------+--------+--------+--------+-----------------------+--------+  +----------+--------+-------+--------+-------------------+           PSV cm/sEDV cmsDescribeArm Pressure (mmHG) +----------+--------+-------+--------+-------------------+ Subclavian120                                        +----------+--------+-------+--------+-------------------+ +---------+--------+--+--------+--+---------+ VertebralPSV cm/s56EDV cm/s13Antegrade +---------+--------+--+--------+--+---------+  Left Carotid Findings: +----------+--------+--------+--------+-----------------------+--------+           PSV cm/sEDV cm/sStenosisPlaque Description     Comments +----------+--------+--------+--------+-----------------------+--------+ CCA Prox  58      8               smooth and heterogenous         +----------+--------+--------+--------+-----------------------+--------+ CCA Distal69      11              smooth and heterogenous         +----------+--------+--------+--------+-----------------------+--------+ ICA Prox  69      20              smooth and heterogenoustortuous +----------+--------+--------+--------+-----------------------+--------+ ICA  [id number]     34                                     tortuous +----------+--------+--------+--------+-----------------------+--------+ ECA       99      14                                              +----------+--------+--------+--------+-----------------------+--------+ +----------+--------+--------+--------+-------------------+           PSV cm/sEDV cm/sDescribeArm Pressure (mmHG) +----------+--------+--------+--------+-------------------+ [id number]                                         +----------+--------+--------+--------+-------------------+ +---------+--------+--+--------+--+---------+ VertebralPSV cm/s41EDV cm/s10Antegrade +---------+--------+--+--------+--+---------+   Summary: Right Carotid: Velocities in the right ICA are consistent with a 1-39% stenosis. Left Carotid:  Velocities in the left ICA are consistent with a 1-39% stenosis. Vertebrals: Bilateral vertebral arteries demonstrate antegrade flow. *See table(s) above for measurements and observations.     Preliminary    [place] EKG SITE RITE  Result Date: 06/09/2020 If Site Rite image not attached, placement could not be confirmed due to current cardiac rhythm.   PHYSICAL EXAM  Temp:  [98.5 F (36.9 C)-100.3 F (37.9 C)] 100.1 F (37.8 C) (05/20 1200) Pulse Rate:  [81-117] 94 (05/20 1315) Resp:  [16-34] 27 (05/20 1315) BP: (127-177)/(66-93) 149/76 (05/20 1315) SpO2:  [89 %-100 %] 96 % (05/20 1315)  General - Well nourished, well developed, sleepy but easily arousable.  Ophthalmologic - fundi not visualized due to noncooperation.  Cardiovascular - Regular rhythm and rate.  Neuro - sleepy but easily arousable, eyes open, orientated to place and people, but not to age and time. No aphasia, fluent language, following all simple commands, mild to moderate dysarthria. Able to name and repeat. No gaze palsy, tracking bilaterally, visual field full, PERRL, however with right gaze nystagmus. Left facial droop. Tongue midline. RUE and RLE 5/5, LUE 1/5 and LLE 2/5, left sensation loss on the face and LUE and LLE, right FTN intact, gait not tested.     ASSESSMENT/PLAN [name] is a 55 y.o. male with history of hypertension, obesity, cardiomyopathy, hyperlipidemia, CHF admitted for right-sided gaze, right-sided weakness numbness, fall. No tPA given due to ICH.    ICH:  right thalamic ICH with IVH, likely due to uncontrolled hypertension  CT head showed right thalamic ICH with IVH with 4 mm midline shift  Repeat CT head no hematoma expansion  MRI stable right thalamic ICH with IVH and midline shift  MRA no aneurysm or AVM  CT repeat in am  2D Echo EF 65-70%  CUS unremarkable  LDL 78  HgbA1c 5.5  UDS negative  SCDs for VTE prophylaxis  No antithrombotic prior to admission, now on No  antithrombotic due to ICH  Ongoing aggressive stroke risk factor management  Therapy recommendations: Pending  Disposition: Pending  Cerebral edema  CT and MRI showed midline shift 4mm  CT repeat in am  On 3% saline @ 75->40 to avoid fluid overload  Na 141-144-147->142  Lasix 40 x 1  Sodium Q6  Hypertensive emergency . Home medication metoprolol 100 twice daily, HCTZ, losartan . Unstable . On Cleviprex, taper off as  able . amlodipine 10, metoprolol 100 twice daily . HCTZ 25, losartan 50 twice daily, clonidine 0.1 3 times daily  Long term BP goal normotensive  Hyperlipidemia  Home meds: Lipitor 40  LDL 78, goal < 70  Now on no statin given recent ICH  Resume statin at discharge  AKI  Cre 0.9->1.68->1.19  On IVF   BMP monitoring  Dysphagia  Speech on board  dys 3 and nectar thick -> dys 2 and thin liquid  Aspiration precaution  Low-grade fever  Tmax 101.2->100.2->100.3  Tachycardia   Blood culture pending  UA WBC 6-10  COVID PCR neg  CT chest basilar atelectasis  Alcohol abuse  About 24 drinks per week  On CIWA protocol  FA/MVI/B1  Other Stroke Risk Factors  Obesity, Body mass index is 36.03 kg/m.   Likely obstructive sleep apnea, undiagnosed    Other Active Problems    Hospital day # 2  This patient is critically ill due to right ICH with IVH, cerebral edema, hypertensive emergency, AKI, alcohol abuse, low-grade fever and at significant risk of neurological worsening, death form hematoma expansion, brain herniation, hydrocephalus, heart failure, renal failure, DT. This patient's care requires constant monitoring of vital signs, hemodynamics, respiratory and cardiac monitoring, review of multiple databases, neurological assessment, discussion with family, other specialists and medical decision making of high complexity. I spent 40 minutes of neurocritical care time in the care of this patient. I had long discussion with wife  at bedside, updated pt current condition, treatment plan and potential prognosis, and answered all the questions.  She expressed understanding and appreciation.    Marvel Plan, MD PhD Stroke Neurology 06/11/2020 2:09 PM    To contact Stroke Continuity provider, please refer to [email or web address]. After hours, contact General Neurology

## 2020-06-11 NOTE — Evaluation (Addendum)
Physical Therapy Evaluation Patient Details Name: [name] MRN: [id number] DOB: 12/04/1965 Today's Date: 06/11/2020   History of Present Illness  Pt is a 55 y.o. male who presented 5/18 with L sided weakness, numbness, and gaze. No tPA administered due to ICH. 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 PMH: HTN, obesity, cardiomyopathy, hyperlipidemia, and CHF.    Clinical Impression  Pt presents with condition above and deficits mentioned below, see PT Problem List. 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 He lives with his wife in a 1-level house with 3 STE with one handrail. Currently, pt is very lethargic, needing stimulation to be and remain aroused for brief periods of time. However, pt and his wife are very motivated to participate in therapy and improve. Pt demonstrates decreased L leg strength, no L UE muscle activation, decreased L UE and leg sensation and proprioception, impaired balance, incoordination, and decreased cognition impacting his ability to understand his deficits and safety issues. Pt is A&Ox4 though. Pt is at high risk for falls. He needed TAx2 for bed mobility and maxAx2 to transfer to stand with bil HHA this date. Pt unable to take steps despite effort this date. Will continue to follow acutely. Pt would greatly benefit from intensive therapy in the CIR setting to maximize his safety and independence with all functional mobility as he has had a significant change in function.    Follow Up Recommendations CIR;Supervision/Assistance - 24 hour    Equipment Recommendations  Rolling walker with 5" wheels;3in1 (PT);Wheelchair cushion (measurements PT);Wheelchair (measurements PT)    Recommendations for Other Services Rehab consult     Precautions / Restrictions Precautions Precautions: Fall Precaution Comments: L hemi, L IJ CVC Restrictions Weight Bearing Restrictions: No       Mobility  Bed Mobility Overal bed mobility: Needs Assistance Bed Mobility: Supine to Sit;Sit to Supine     Supine to sit: Total assist;+2 for physical assistance;+2 for safety/equipment;HOB elevated Sit to supine: Total assist;+2 for physical assistance;+2 for safety/equipment   General bed mobility comments: Pt very lethargic, not following commands to assist with bed mobility, needing TAx2 to manage trunk and legs.    Transfers Overall transfer level: Needs assistance Equipment used: 2 person hand held assist Transfers: Sit to/from Stand Sit to Stand: Max assist;+2 physical assistance         General transfer comment: Sit <> stand 1x from EOB with bil HHA and L knee block, with pt externally rotating and extending L leg anteriorly. Provided verbal and tactile cues to shift weight to R to bring L leg posteriorly, mod success. L knee buckling when block removed, thus maxAx2 to steady and to come to stand.  Ambulation/Gait             General Gait Details: Unable  Stairs            Wheelchair Mobility    Modified Rankin (Stroke Patients Only) Modified Rankin (Stroke Patients Only) Pre-Morbid Rankin Score: No symptoms Modified Rankin: Severe disability     Balance Overall balance assessment: Needs assistance Sitting-balance support: Feet supported;Single extremity supported Sitting balance-Leahy Scale: Poor Sitting balance - Comments: UE support and mod-TA posterior and L lateral support sitting EOB, needing cues initially to keep both feet on ground as he tried to keep R foot lifted and knee extended, stating "we are going driving". Postural control: Posterior lean;Left lateral lean Standing balance support: Bilateral upper extremity  supported Standing balance-Leahy Scale: Poor Standing balance comment: MaxAx2 and UE support to stand statically with L knee block.                             Pertinent Vitals/Pain Pain Assessment: Faces Faces  Pain Scale: No hurt Pain Intervention(s): Monitored during session    Home Living Family/patient expects to be discharged to:: Private residence Living Arrangements: Spouse/significant other Available Help at Discharge: Family;Available 24 hours/day (wife works but daughter can assist also) Type of Home: House Home Access: Stairs to enter Entrance Stairs-Rails: Left (ascending) Entrance Stairs-Number of Steps: 3 Home Layout: One level Home Equipment: None      Prior Function Level of Independence: Independent         Comments: Pt drives a forklift.     Hand Dominance        Extremity/Trunk Assessment   Upper Extremity Assessment Upper Extremity Assessment: LUE deficits/detail LUE Deficits / Details: No noted muscle contraction throughout arm when cued and no sponatenous movement during session; decreased sensation and proprioception noted with mobility; edema distally LUE Sensation: decreased proprioception;decreased light touch LUE Coordination: decreased fine motor;decreased gross motor    Lower Extremity Assessment Lower Extremity Assessment: LLE deficits/detail LLE Deficits / Details: Decreased strength, with quads MMT of 2+, unable to keep pt's attention long enough to get MMT of other major muscle groups; decreased sensation and proprioception noted through mobility LLE Sensation: decreased light touch;decreased proprioception LLE Coordination: decreased fine motor;decreased gross motor    Cervical / Trunk Assessment Cervical / Trunk Assessment: Normal  Communication   Communication: No difficulties  Cognition Arousal/Alertness: Lethargic Behavior During Therapy: Impulsive Overall Cognitive Status: Impaired/Different from baseline Area of Impairment: Attention;Memory;Following commands;Safety/judgement;Awareness;Problem solving                   Current Attention Level: Alternating Memory: Decreased short-term memory Following Commands: Follows one  step commands inconsistently;Follows one step commands with increased time Safety/Judgement: Decreased awareness of safety;Decreased awareness of deficits Awareness: Intellectual Problem Solving: Slow processing;Decreased initiation;Difficulty sequencing;Requires verbal cues;Requires tactile cues General Comments: Pt very lethargic, opening eyes and waking up for brief periods with stimulation but then fairly quick return to sleeping and snoring. Pt with poor memory, stating he owned various AD/AE but wife reported they did not have any. Pt with poor awareness into his deficits and safety.      General Comments General comments (skin integrity, edema, etc.): VSS on RA, wife arrived at end of session    Exercises     Assessment/Plan    PT Assessment Patient needs continued PT services  PT Problem List Decreased strength;Decreased activity tolerance;Decreased mobility;Decreased balance;Decreased coordination;Decreased cognition;Decreased knowledge of use of DME;Decreased safety awareness;Impaired sensation;Obesity       PT Treatment Interventions DME instruction;Gait training;Stair training;Functional mobility training;Therapeutic activities;Therapeutic exercise;Balance training;Neuromuscular re-education;Cognitive remediation;Patient/family education;Wheelchair mobility training    PT Goals (Current goals can be found in the Care Plan section)  Acute Rehab PT Goals Patient Stated Goal: pt did not state; wife desired pt to go to CIR to improve PT Goal Formulation: With patient/family Time For Goal Achievement: 06/25/20 Potential to Achieve Goals: Good    Frequency Min 4X/week   Barriers to discharge        Co-evaluation               AM-PAC PT "6 Clicks" Mobility  Outcome Measure Help needed turning from your back to your side while  in a flat bed without using bedrails?: Total Help needed moving from lying on your back to sitting on the side of a flat bed without using  bedrails?: Total Help needed moving to and from a bed to a chair (including a wheelchair)?: Total Help needed standing up from a chair using your arms (e.g., wheelchair or bedside chair)?: Total Help needed to walk in hospital room?: Total Help needed climbing 3-5 steps with a railing? 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

## 2020-06-11 NOTE — Progress Notes (Signed)
OT Cancellation Note  Patient Details Name: [name] MRN: [id number] DOB: 1965/11/03   Cancelled Treatment:    Reason Eval/Treat Not Completed: Active bedrest order (Pt remains on bedrest. Will assess as schedule allows once activity orders updated. Thanks)  Burnett Corrente Ean Gettel, OT/L   Acute OT Clinical Specialist Acute Rehabilitation Services Pager [phone] Office [phone]  06/11/2020, 7:45 AM

## 2020-06-11 NOTE — Progress Notes (Signed)
Pt more difficult to arouse.  Dr. Roda Shutters notified and ordered stat CT scan of head.  Did notify Dr. Roda Shutters that pt received 1mg  ativan at 1600 for CIWA.

## 2020-06-12 DIAGNOSIS — I610 Nontraumatic intracerebral hemorrhage in hemisphere, subcortical: Secondary | ICD-10-CM | POA: Diagnosis not present

## 2020-06-12 LAB — TRIGLYCERIDES: Triglycerides: 153 mg/dL — ABNORMAL HIGH (ref <150)

## 2020-06-12 LAB — CBC
HCT: 34.8 % — ABNORMAL LOW (ref 39.0–52.0)
Hemoglobin: 11.1 g/dL — ABNORMAL LOW (ref 13.0–17.0)
MCH: 31 pg (ref 26.0–34.0)
MCHC: 31.9 g/dL (ref 30.0–36.0)
MCV: 97.2 fL (ref 80.0–100.0)
Platelets: 216 10*3/uL (ref 150–400)
RBC: 3.58 MIL/uL — ABNORMAL LOW (ref 4.22–5.81)
RDW: 12.4 % (ref 11.5–15.5)
WBC: 6.9 10*3/uL (ref 4.0–10.5)
nRBC: 0 % (ref 0.0–0.2)

## 2020-06-12 LAB — BASIC METABOLIC PANEL
Anion gap: 9 (ref 5–15)
BUN: 6 mg/dL (ref 6–20)
CO2: 27 mmol/L (ref 22–32)
Calcium: 8.6 mg/dL — ABNORMAL LOW (ref 8.9–10.3)
Chloride: 108 mmol/L (ref 98–111)
Creatinine, Ser: 1.21 mg/dL (ref 0.61–1.24)
GFR, Estimated: >60 mL/min (ref >60)
Glucose, Bld: 111 mg/dL — ABNORMAL HIGH (ref 70–99)
Potassium: 3.3 mmol/L — ABNORMAL LOW (ref 3.5–5.1)
Sodium: 144 mmol/L (ref 135–145)

## 2020-06-12 LAB — SODIUM: Sodium: 141 mmol/L (ref 135–145)

## 2020-06-12 MED ORDER — POTASSIUM CHLORIDE CRYS ER 10 MEQ PO TBCR: 30.0000 meq | EXTENDED_RELEASE_TABLET | ORAL | Status: DC

## 2020-06-12 MED ORDER — HYDRALAZINE HCL 20 MG/ML IJ SOLN
20.0000 mg | Freq: Four times a day (QID) | INTRAMUSCULAR | Status: DC | PRN
  Administered 2020-06-12 – 2020-06-13 (×2): 20 mg via INTRAVENOUS
  Filled 2020-06-12 (×3): qty 1

## 2020-06-12 MED ORDER — POTASSIUM CHLORIDE 20 MEQ PO PACK
40.0000 meq | PACK | Freq: Once | ORAL | Status: AC
  Administered 2020-06-12: 40 meq via ORAL
  Filled 2020-06-12: qty 2

## 2020-06-12 MED ORDER — LABETALOL HCL 5 MG/ML IV SOLN
40.0000 mg | INTRAVENOUS | Status: DC | PRN
  Administered 2020-06-12 – 2020-06-20 (×6): 40 mg via INTRAVENOUS
  Filled 2020-06-12 (×7): qty 8

## 2020-06-12 MED ORDER — POTASSIUM CHLORIDE 20 MEQ PO PACK
20.0000 meq | PACK | Freq: Once | ORAL | Status: AC
  Administered 2020-06-12: 20 meq via ORAL
  Filled 2020-06-12: qty 1

## 2020-06-12 MED ORDER — ENOXAPARIN SODIUM 40 MG/0.4ML IJ SOSY
40.0000 mg | PREFILLED_SYRINGE | INTRAMUSCULAR | Status: DC
  Administered 2020-06-12 – 2020-06-22 (×11): 40 mg via SUBCUTANEOUS
  Filled 2020-06-12 (×11): qty 0.4

## 2020-06-12 MED ORDER — CLONIDINE HCL 0.1 MG PO TABS
0.3000 mg | ORAL_TABLET | Freq: Three times a day (TID) | ORAL | Status: DC
  Administered 2020-06-12 – 2020-06-18 (×18): 0.3 mg via ORAL
  Filled 2020-06-12 (×2): qty 3
  Filled 2020-06-12: qty 1
  Filled 2020-06-12 (×3): qty 3
  Filled 2020-06-12: qty 1
  Filled 2020-06-12 (×2): qty 3
  Filled 2020-06-12: qty 1
  Filled 2020-06-12: qty 3
  Filled 2020-06-12: qty 1
  Filled 2020-06-12 (×2): qty 3
  Filled 2020-06-12 (×2): qty 1
  Filled 2020-06-12 (×2): qty 3

## 2020-06-12 NOTE — Progress Notes (Signed)
Inpatient Rehab Admissions Coordinator Note:   Per Pt/OT recommendations, pt was screened for CIR candidacy by Wolfgang Phoenix, MS, CCC-SLP.  At this time we are recommending an inpatient rehab consult. Please place an IP Rehab MD consult if pt would like to be considered.  Please contact me with questions.    Wolfgang Phoenix, MS, CCC-SLP Admissions Coordinator [phone] 06/12/20 3:49 PM

## 2020-06-12 NOTE — Evaluation (Signed)
Occupational Therapy Evaluation Patient Details Name: [name] MRN: [id number] DOB: Dec 30, 1965 Today's Date: 06/12/2020    History of Present Illness Pt is a 55 y.o. male who presented 5/18 with L sided weakness, numbness, and gaze. No tPA administered due to ICH. 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 PMH: HTN, obesity, cardiomyopathy, hyperlipidemia, and CHF.   Clinical Impression   Pt PTA: Pt living with spouse and reports independence. Pt currently,  lethargic, following commands with increased effort and requiring cues for safety awareness and deficits. Pt A/O x4.  Pt limited by decreased strength, poor ability to care for self, decreased cognition and decreased endurance. VSS on RA;  SBP <160.  Pt maxA to EOB from chair position in bed and totalA +2 for sitting to supine. Pt set-upA to totalA for ADL. Cognitive deficits present and vision needs to be further tested. Pt Pt would benefit from continued OT skilled services. OT following acutely.    Follow Up Recommendations  CIR    Equipment Recommendations  3 in 1 bedside commode;Wheelchair (measurements OT);Wheelchair cushion (measurements OT);Hospital bed    Recommendations for Other Services Rehab consult     Precautions / Restrictions Precautions Precautions: Fall Precaution Comments: L hemi, L IJ CVC Restrictions Weight Bearing Restrictions: No      Mobility Bed Mobility Overal bed mobility: Needs Assistance Bed Mobility: Supine to Sit;Sit to Supine     Supine to sit: Max assist;+2 for physical assistance;+2 for safety/equipment;HOB elevated Sit to supine: Total assist;+2 for physical assistance;+2 for safety/equipment   General bed mobility comments: Pt very lethargic; started to get very lethargic after 5 mins at EOB    Transfers                 General transfer comment: no +2 available, DNT    Balance Overall balance assessment:  Needs assistance Sitting-balance support: Feet supported;Single extremity supported Sitting balance-Leahy Scale: Poor Sitting balance - Comments: [email or web address] to modA for leaning to L Postural control: Posterior lean;Left lateral lean                                 ADL either performed or assessed with clinical judgement   ADL Overall ADL's : Needs assistance/impaired Eating/Feeding: NPO   Grooming: Minimal assistance   Upper Body Bathing: Moderate assistance   Lower Body Bathing: Total assistance   Upper Body Dressing : Moderate assistance   Lower Body Dressing: Total assistance   Toilet Transfer: Total assistance;+2 for physical assistance;+2 for safety/equipment Toilet Transfer Details (indicate cue type and reason): unable to simulate without +2 Toileting- Clothing Manipulation and Hygiene: Total assistance;+2 for physical assistance;+2 for safety/equipment;Bed level       Functional mobility during ADLs: Maximal assistance;+2 for physical assistance;+2 for safety/equipment;Cueing for safety General ADL Comments: Pt limited by decreased strength, poor ability to care for self, decreased cognition and decreased endurance.     Vision Baseline Vision/History: No visual deficits Patient Visual Report: Other (comment) (depth perception; inferior vision deficit?) Vision Assessment?: Vision impaired- to be further tested in functional context;Yes Eye Alignment: Within Functional Limits Ocular Range of Motion: Within Functional Limits Alignment/Gaze Preference: Within Defined Limits Additional Comments: scanning well; no blurriness; possible inferior vision blurriness; peripheral vision, WFLs     Perception     Praxis      Pertinent Vitals/Pain Pain Assessment: Faces Faces Pain Scale:  No hurt Pain Intervention(s): Monitored during session     Hand Dominance Right   Extremity/Trunk Assessment Upper Extremity Assessment Upper Extremity Assessment:  Generalized weakness;LUE deficits/detail LUE Deficits / Details: edema present; 2-/5 in biceps, triceps and some deltoid for shoulder extension; poor grip 2-/5 grip LUE Sensation: decreased proprioception;decreased light touch LUE Coordination: decreased fine motor;decreased gross motor   Lower Extremity Assessment Lower Extremity Assessment: LLE deficits/detail LLE Deficits / Details: adducting LLE LLE Sensation: decreased light touch;decreased proprioception LLE Coordination: decreased fine motor;decreased gross motor   Cervical / Trunk Assessment Cervical / Trunk Assessment: Normal   Communication Communication Communication: No difficulties   Cognition Arousal/Alertness: Lethargic Behavior During Therapy: Impulsive Overall Cognitive Status: Impaired/Different from baseline Area of Impairment: Attention;Memory;Following commands;Safety/judgement;Awareness;Problem solving                   Current Attention Level: Alternating Memory: Decreased short-term memory Following Commands: Follows one step commands inconsistently;Follows one step commands with increased time Safety/Judgement: Decreased awareness of safety;Decreased awareness of deficits Awareness: Intellectual Problem Solving: Slow processing;Decreased initiation;Difficulty sequencing;Requires verbal cues;Requires tactile cues General Comments: Pt continues to be lethargic, following commands with increased effort and requiring cues for safety awareness and deficits. pt A/O x4.   General Comments  VSS on RA;  SBP <160    Exercises Exercises: Other exercises Other Exercises Other Exercises: PROM and AAROM shoulder through digits x5 reps flex/ext   Shoulder Instructions      Home Living Family/patient expects to be discharged to:: Private residence Living Arrangements: Spouse/significant other Available Help at Discharge: Family;Available 24 hours/day (wife works but daughter can assist also) Type of Home:  House Home Access: Stairs to enter Secretary/administrator of Steps: 3 Entrance Stairs-Rails: Left (ascending) Home Layout: One level     Bathroom Shower/Tub: Chief Strategy Officer: Standard     Home Equipment: None      Lives With: Spouse    Prior Functioning/Environment Level of Independence: Independent        Comments: Pt drives a forklift.        OT Problem List: Decreased strength;Decreased activity tolerance;Impaired balance (sitting and/or standing);Decreased cognition;Decreased safety awareness;Pain;Increased edema;Cardiopulmonary status limiting activity;Decreased coordination;Decreased knowledge of use of DME or AE;Impaired UE functional use;Obesity      OT Treatment/Interventions: Self-care/ADL training;Therapeutic exercise;Energy conservation;DME and/or AE instruction;Therapeutic activities;Patient/family education;Balance training;Cognitive remediation/compensation;Neuromuscular education    OT Goals(Current goals can be found in the care plan section) Acute Rehab OT Goals Patient Stated Goal: pt did not state; wife desired pt to go to CIR to improve OT Goal Formulation: With patient/family Time For Goal Achievement: 06/26/20 Potential to Achieve Goals: Good ADL Goals Pt Will Perform Grooming: with set-up;sitting Pt Will Transfer to Toilet: with mod assist;squat pivot transfer;bedside commode Pt/caregiver will Perform Home Exercise Program: Increased strength;Left upper extremity;With written HEP provided;With minimal assist Additional ADL Goal #1: Pt will perform higher level cognitive task with <3 errors in 3/5 trials. Additional ADL Goal #2: Pt will perform x10 mins of OOB ADL tasks with minimal cues for attending to task.  OT Frequency: Min 2X/week   Barriers to D/C:            Co-evaluation              AM-PAC OT "6 Clicks" Daily Activity     Outcome Measure Help from another person eating meals?: Total Help from another person  taking care of personal grooming?: A Lot Help from another person toileting, which includes using  toliet, bedpan, or urinal?: A Lot Help from another person bathing (including washing, rinsing, drying)?: A Lot Help from another person to put on and taking off regular upper body clothing?: A Lot Help from another person to put on and taking off regular lower body clothing?: Total 6 Click Score: 10   End of Session Nurse Communication: Mobility status  Activity Tolerance: Patient tolerated treatment well Patient left: in bed;with call bell/phone within reach;with bed alarm set;Other (comment) (in chair position)  OT Visit Diagnosis: Unsteadiness on feet (R26.81);Muscle weakness (generalized) (M62.81);Pain Pain - part of body:  (head)                Time: 9675-9163 OT Time Calculation (min): 45 min Charges:  OT General Charges $OT Visit: 1 Visit OT Evaluation $OT Eval Moderate Complexity: 1 Mod OT Treatments $Self Care/Home Management : 8-22 mins $Neuromuscular Re-education: 8-22 mins  Flora Lipps, OTR/L Acute Rehabilitation Services Pager: [phone] Office: [phone]   Kerensa Nicklas C 06/12/2020, 3:00 PM

## 2020-06-12 NOTE — Progress Notes (Signed)
  Speech Language Pathology Treatment: Dysphagia  Patient Details Name: [name] MRN: [id number] DOB: 1965-09-01 Today's Date: 06/12/2020 Time: 2297-9892 SLP Time Calculation (min) (ACUTE ONLY): 20 min  Assessment / Plan / Recommendation Clinical Impression  Patient seen with spouse present in room for ST session focusing on toleration of recently prescribed Dys 2, thin liquids diet. SLP observed patient with intake of lunch meal and provided setup A and minA cues for left anterior spillage of solids and for patient to check for pocketing on left side. After SLP provided verbal cues, patient then started to independently check, using tongue and sometimes finger to clear left sided oral pocketing. He did not require cues for taking small bites, as he performed this with just supervision. Patient and wife educated by SLP regarding swallow safety precautions. No overt s/s aspiration or penetration observed during PO intake.   HPI HPI: [name] is a 55 y.o. male with a PMHx of obesity, HTN, ETOH abuse ( pk/day x 10 yrs), HLD, Cs/dHF who presents after being found down CT revealed acute right thalamic hemorrhage with extension to the ventricles. 32mm shift.      SLP Plan  Continue with current plan of care       Recommendations  Diet recommendations: Dysphagia 2 (fine chop);Thin liquid Liquids provided via: Cup;Straw Medication Administration: Whole meds with puree Supervision: Patient able to self feed;Staff to assist with self feeding Compensations: Slow rate;Small sips/bites;Lingual sweep for clearance of pocketing Postural Changes and/or Swallow Maneuvers: Seated upright 90 degrees;Upright 30-60 min after meal                General recommendations: Rehab consult Oral Care Recommendations: Oral care BID Follow up Recommendations: Inpatient Rehab SLP Visit Diagnosis: Dysphagia, unspecified (R13.10) Plan: Continue with current plan of care       GO                 Angela Nevin, MA, CCC-SLP Speech Therapy [facility]

## 2020-06-12 NOTE — Progress Notes (Signed)
STROKE TEAM PROGRESS NOTE   SUBJECTIVE (INTERVAL HISTORY) Patient is sitting up in bed.  He appears mildly short of breath.  He is awake alert and interactive.  Continues to have mild left hemiparesis.  Blood pressure is controlled but still requires low-dose Cleviprex drip.  He is on multiple oral blood pressure medications.  He is cleared by speech therapy for dysphagia 2 diet and is eating well. Vital signs stable.  Neurological exam unchanged Repeat CT scan of the head yesterday shows stable appearance of his right thalamic hemorrhage with intraventricular extension and stable mild left word midline shift.  No hydrocephalus.  He is on hypertonic saline at 40 cc an hour and serum sodium is 144 this morning. OBJECTIVE Temp:  [98.1 F (36.7 C)-100.1 F (37.8 C)] 98.1 F (36.7 C) (05/21 0800) Pulse Rate:  [79-106] 94 (05/21 0830) Cardiac Rhythm: Normal sinus rhythm;Sinus tachycardia (05/20 2000) Resp:  [16-34] 24 (05/21 0830) BP: (127-169)/(66-97) 143/76 (05/21 0830) SpO2:  [91 %-100 %] 96 % (05/21 0830)  Recent Labs  Lab 06/09/20 1607  GLUCAP 123*   Recent Labs  Lab 06/09/20 1602 06/09/20 1608 06/09/20 1809 06/10/20 0724 06/10/20 1658 06/11/20 0433 06/11/20 1030 06/11/20 1600 06/11/20 2233 06/12/20 0420  NA 138 138   < > 144   < > 142 141 143 142 144  K 4.0 3.9  --  4.3  --  4.1  --   --   --  3.3*  CL 102 102  --  114*  --  113*  --   --   --  108  CO2 25  --   --  24  --  23  --   --   --  27  GLUCOSE 133* 123*  --  163*  --  146*  --   --   --  111*  BUN 14 17  --  16  --  7  --   --   --  6  CREATININE 1.16 0.90  --  1.68*  --  1.19  --   --   --  1.21  CALCIUM 9.6  --   --  8.5*  --  8.5*  --   --   --  8.6*   < > = values in this interval not displayed.   Recent Labs  Lab 06/09/20 1602  AST 40  ALT 38  ALKPHOS 66  BILITOT 0.9  PROT 7.6  ALBUMIN 4.2   Recent Labs  Lab 06/09/20 1602 06/09/20 1608 06/10/20 0724 06/11/20 0433 06/12/20 0420  WBC 9.3  --   8.4 10.9* 6.9  NEUTROABS 7.8*  --   --   --   --   HGB 13.8 15.0 12.4* 12.2* 11.1*  HCT 41.1 44.0 37.4* 36.6* 34.8*  MCV 93.4  --  96.6 97.1 97.2  PLT 260  --  270 278 216   No results for input(s): CKTOTAL, CKMB, CKMBINDEX, TROPONINI in the last 168 hours. Recent Labs    06/09/20 1602  LABPROT 12.8  INR 1.0   Recent Labs    06/10/20 0724  COLORURINE YELLOW  LABSPEC 1.019  PHURINE 5.0  GLUCOSEU 50*  HGBUR SMALL*  BILIRUBINUR NEGATIVE  KETONESUR NEGATIVE  PROTEINUR 30*  NITRITE NEGATIVE  LEUKOCYTESUR NEGATIVE       Component Value Date/Time   CHOL 196 06/09/2020 1809   TRIG 153 (H) 06/12/2020 0420   HDL 52 06/09/2020 1809   CHOLHDL 3.8 06/09/2020 1809  VLDL 66 (H) 06/09/2020 1809   LDLCALC 78 06/09/2020 1809   Lab Results  Component Value Date   HGBA1C 5.5 06/09/2020      Component Value Date/Time   LABOPIA NONE DETECTED 06/09/2020 2256   COCAINSCRNUR NONE DETECTED 06/09/2020 2256   LABBENZ NONE DETECTED 06/09/2020 2256   AMPHETMU NONE DETECTED 06/09/2020 2256   THCU NONE DETECTED 06/09/2020 2256   LABBARB NONE DETECTED 06/09/2020 2256    No results for input(s): ETH in the last 168 hours.  I have personally reviewed the radiological images below and agree with the radiology interpretations.  CT HEAD WO CONTRAST  Result Date: 06/11/2020 CLINICAL DATA:  Left arm weakness. Intracranial hemorrhage follow-up EXAM: CT HEAD WITHOUT CONTRAST TECHNIQUE: Contiguous axial images were obtained from the base of the skull through the vertex without intravenous contrast. COMPARISON:  Head CT 06/10/2020 FINDINGS: Brain: Unchanged intraparenchymal hemorrhage centered in the right thalamus with intraventricular extension. Size and configuration of the ventricles is unchanged. Mild leftward midline shift is also unchanged. No new site of hemorrhage. Vascular: No abnormal hyperdensity of the major intracranial arteries or dural venous sinuses. No intracranial atherosclerosis.  Skull: The visualized skull base, calvarium and extracranial soft tissues are normal. Sinuses/Orbits: No fluid levels or advanced mucosal thickening of the visualized paranasal sinuses. No mastoid or middle ear effusion. The orbits are normal. IMPRESSION: 1. Unchanged size and configuration of right thalamic intraparenchymal hemorrhage with intraventricular extension. 2. Unchanged mild leftward midline shift. Electronically Signed   By: Deatra RobinsonKevin  Herman M.D.   On: 06/11/2020 18:49   CT HEAD WO CONTRAST  Result Date: 06/09/2020 CLINICAL DATA:  Intracranial hemorrhage follow EXAM: CT HEAD WITHOUT CONTRAST TECHNIQUE: Contiguous axial images were obtained from the base of the skull through the vertex without intravenous contrast. COMPARISON:  06/09/2020 at 4:10 p.m. FINDINGS: Brain: Unchanged appearance of intracranial hemorrhage in the right thalamus with extension into the ventricles. 4 mm leftward midline shift is also unchanged. Vascular: No abnormal hyperdensity of the major intracranial arteries or dural venous sinuses. No intracranial atherosclerosis. Skull: The visualized skull base, calvarium and extracranial soft tissues are normal. Sinuses/Orbits: No fluid levels or advanced mucosal thickening of the visualized paranasal sinuses. No mastoid or middle ear effusion. The orbits are normal. IMPRESSION: 1. Unchanged appearance of right thalamic intracranial hemorrhage with extension into the ventricles. 2. Unchanged 4 mm leftward midline shift. Electronically Signed   By: Deatra RobinsonKevin  Herman M.D.   On: 06/09/2020 22:18   CT SOFT TISSUE NECK WO CONTRAST  Result Date: 06/09/2020 CLINICAL DATA:  Foreign body in the neck. EXAM: CT NECK WITHOUT CONTRAST TECHNIQUE: Multidetector CT imaging of the neck was performed following the standard protocol without intravenous contrast. COMPARISON:  None. FINDINGS: PHARYNX AND LARYNX: The nasopharynx, oropharynx and larynx are normal. Visible portions of the oral cavity, tongue  base and floor of mouth are normal. Normal epiglottis, vallecula and pyriform sinuses. The larynx is normal. No retropharyngeal abscess, effusion or lymphadenopathy. SALIVARY GLANDS: Normal parotid, submandibular and sublingual glands. THYROID: Normal. LYMPH NODES: No enlarged or abnormal density lymph nodes. VASCULAR: Major cervical vessels are patent. Left IJ central venous catheter. LIMITED INTRACRANIAL: Normal. VISUALIZED ORBITS: Normal. MASTOIDS AND VISUALIZED PARANASAL SINUSES: No fluid levels or advanced mucosal thickening. No mastoid effusion. SKELETON: No bony spinal canal stenosis. No lytic or blastic lesions. UPPER CHEST: Clear. OTHER: None. IMPRESSION: Normal CT of the neck. Electronically Signed   By: Deatra RobinsonKevin  Herman M.D.   On: 06/09/2020 22:22   CT CHEST WO  CONTRAST  Result Date: 06/09/2020 CLINICAL DATA:  Mediastinal widening on chest radiograph, possible foreign body EXAM: CT CHEST WITHOUT CONTRAST TECHNIQUE: Multidetector CT imaging of the chest was performed following the standard protocol without IV contrast. COMPARISON:  Radiograph 06/09/2020, 01/02/2018, contemporary neck radiograph FINDINGS: Cardiovascular: Left IJ approach central venous catheter tip terminates near the level of the left brachiocephalic-superior caval junction. Cardiac size is top normal no sizable pericardial effusion. Atherosclerotic plaque within the normal caliber aorta. No hyperdense mural thickening to suggest an intra mural hematoma. No periaortic stranding or hemorrhage is evident on this unenhanced exam. Central pulmonary arteries are normal caliber. Luminal evaluation of the vasculature is precluded in the absence of contrast media. Mediastinum/Nodes: Some pronounced upper mediastinal widening seen on the chest radiograph is likely a combination of portable technique and abundant mediastinal fat noted on this exam. No mediastinal fluid or gas. Normal thyroid gland and thoracic inlet. No acute abnormality of the  trachea or esophagus. No worrisome mediastinal or axillary adenopathy. Hilar nodal evaluation is limited in the absence of intravenous contrast media. Lungs/Pleura: Low lung volumes and atelectasis with more bandlike subsegmental atelectasis towards the lung bases. No effusion. No pneumothorax. Upper Abdomen: No acute abnormalities present in the visualized portions of the upper abdomen. Musculoskeletal: No acute osseous abnormality or suspicious osseous lesion. Degenerative changes are present in the imaged spine and shoulders. No worrisome chest wall mass. No unexpected radiodense foreign bodies. IMPRESSION: 1. No expected radiopaque foreign body seen in the chest. 2. Left IJ approach central venous catheter tip terminates at the left brachiocephalic-superior caval junction. 3. Mediastinal widening present on comparison chest radiograph is likely a combination artifactual accentuation due to portable technique and abundant mediastinal fat noted on this exam. No worrisome aortic dilatation, or other acute aortic abnormality within the limitations of an unenhanced CT. 4. Basilar atelectatic changes. Electronically Signed   By: Kreg Shropshire M.D.   On: 06/09/2020 23:26   MR ANGIO HEAD WO CONTRAST  Result Date: 06/10/2020 CLINICAL DATA:  Cerebral hemorrhage. EXAM: MRA HEAD WITHOUT CONTRAST TECHNIQUE: Angiographic images of the Circle of Willis were acquired using MRA technique without intravenous contrast. COMPARISON:  No pertinent prior exam. FINDINGS: Motion limited exam.  Within this limitation: Anterior circulation: No large vessel occlusion or evidence of proximal hemodynamically significant stenosis. No evidence of hemodynamically significant proximal stenosis, although evaluation limited by motion. No aneurysm identified. No evidence of vascular malformation in the region of the right thalamic hemorrhage, although acute blood products limit evaluation. There is intrinsic T1 hyperintensity within the  hemorrhage, which also limits evaluation. Posterior circulation: No large vessel occlusion or evidence of proximal hemodynamically significant stenosis. Bilateral posterior communicating arteries. No aneurysm. IMPRESSION: 1. No evidence of a vascular malformation in the region of the right thalamic hemorrhage, although acute blood products limit evaluation. 2. No large vessel occlusion or evidence of proximal hemodynamically significant stenosis on this motion limited exam. Electronically Signed   By: Feliberto Harts MD   On: 06/10/2020 12:02   MR BRAIN W WO CONTRAST  Result Date: 06/10/2020 CLINICAL DATA:  Hemorrhage. EXAM: MRI HEAD WITHOUT AND WITH CONTRAST TECHNIQUE: Multiplanar, multiecho pulse sequences of the brain and surrounding structures were obtained without and with intravenous contrast. CONTRAST:  10mL GADAVIST GADOBUTROL 1 MMOL/ML IV SOLN COMPARISON:  CT Jun 09, 2020. FINDINGS: Mildly motion limited exam. Brain: When comparing across modalities, no substantial change in size/extent of an acute right thalamic hemorrhage, measuring up to approximately 3.8 by 2.4 cm. The  hemorrhage demonstrates areas of intrinsic T1 hyperintensity. Similar extraventricular extension with blood products in right greater than left lateral ventricles, third ventricle, and fourth ventricle. No progressive ventriculomegaly. Surrounding edema with similar 4 mm of leftward midline shift. Outside of the hemorrhage itself, and no confluent restricted diffusion to suggest acute infarct. No visible mass lesion, although acute blood products limit evaluation. Moderate patchy T2/FLAIR hyperintensities within the white matter and pons, nonspecific but most likely related to chronic microvascular ischemic disease. Vascular: Further evaluated on concurrent MRA. Skull and upper cervical spine: Normal marrow signal. Sinuses/Orbits: Clear sinuses.  No acute orbital finding. Other: No mastoid effusions. IMPRESSION: 1. When comparing  across modalities, no substantial change in size/extent of an acute right thalamic hemorrhage with intraventricular extension, as detailed above. Similar 4 mm of leftward midline shift. No progressive ventriculomegaly. 2. No specific evidence of acute infarct or visible mass lesion, although acute blood products limits evaluation. A follow-up MRI after resolution of hemorrhage could further evaluate if clinically indicated. 3. Moderate chronic microvascular ischemic disease. Electronically Signed   By: Feliberto Harts MD   On: 06/10/2020 11:51   DG Chest Portable 1 View  Result Date: 06/09/2020 CLINICAL DATA:  55 year old male with history of central line placement. EXAM: PORTABLE CHEST 1 VIEW COMPARISON:  Chest x-ray 01/02/2018. FINDINGS: Left-sided internal jugular central venous catheter with tip terminating near the junction of the innominate vein with the superior vena cava. Lung volumes are low. No consolidative airspace disease. No pleural effusions. No pneumothorax. No evidence of pulmonary edema. Heart size is mildly enlarged. Marked widening of the mediastinum, new compared to the prior examination. IMPRESSION: 1. Left internal jugular central venous catheter in position, as above, with profound widening of the mediastinum. If there is clinical concern for aortic dissection and/or mediastinal hematoma, further evaluation with chest CTA would be recommended. Electronically Signed   By: Trudie Reed M.D.   On: 06/09/2020 18:11   ECHOCARDIOGRAM COMPLETE  Result Date: 06/10/2020    ECHOCARDIOGRAM REPORT   Patient Name:   Taylor Levick [name] Date of Exam: 06/10/2020 Medical Rec #:  [id number]      Height:       70.0 in Accession #:    [id number]     Weight:       251.1 lb Date of Birth:  [birth year]      BSA:          2.299 m Patient Age:    54 years       BP:           140/71 mmHg Patient Gender: M              HR:           102 bpm. Exam Location:  Inpatient Procedure: 2D Echo, Color Doppler and Cardiac  Doppler Indications:    CVA  History:        Patient has prior history of Echocardiogram examinations, most                 recent 08/12/2014. Cardiomyopathy and CHF,                 Signs/Symptoms:Shortness of Breath; Risk Factors:Hypertension,                 Dyslipidemia and Diabetes.  Sonographer:    Neomia Dear RDCS Referring Phys: [id number] ASHISH ARORA  Sonographer Comments: Patient is morbidly obese and Technically difficult study due to poor echo windows. IMPRESSIONS  1.  Left ventricular ejection fraction, by estimation, is 65 to 70%. The left ventricle has normal function. The left ventricle has no regional wall motion abnormalities. There is severe left ventricular hypertrophy. Indeterminate diastolic filling due to E-A fusion.  2. Right ventricular systolic function is normal. The right ventricular size is normal. There is normal pulmonary artery systolic pressure. The estimated right ventricular systolic pressure is 28.4 mmHg.  3. The mitral valve is normal in structure. Trivial mitral valve regurgitation. No evidence of mitral stenosis.  4. The aortic valve is normal in structure. Aortic valve regurgitation is trivial. No aortic stenosis is present.  5. The inferior vena cava is normal in size with greater than 50% respiratory variability, suggesting right atrial pressure of 3 mmHg.  6. Increased flow velocities may be secondary to anemia, thyrotoxicosis, hyperdynamic or high flow state. Conclusion(s)/Recommendation(s): No intracardiac source of embolism detected on this transthoracic study. A transesophageal echocardiogram is recommended to exclude cardiac source of embolism if clinically indicated. FINDINGS  Left Ventricle: Left ventricular ejection fraction, by estimation, is 65 to 70%. The left ventricle has normal function. The left ventricle has no regional wall motion abnormalities. The left ventricular internal cavity size was normal in size. There is  severe left ventricular hypertrophy.  Indeterminate diastolic filling due to E-A fusion. Right Ventricle: The right ventricular size is normal. No increase in right ventricular wall thickness. Right ventricular systolic function is normal. There is normal pulmonary artery systolic pressure. The tricuspid regurgitant velocity is 2.52 m/s, and  with an assumed right atrial pressure of 3 mmHg, the estimated right ventricular systolic pressure is 28.4 mmHg. Left Atrium: Left atrial size was normal in size. Right Atrium: Right atrial size was normal in size. Pericardium: There is no evidence of pericardial effusion. Mitral Valve: The mitral valve is normal in structure. Trivial mitral valve regurgitation. No evidence of mitral valve stenosis. MV peak gradient, 13.0 mmHg. The mean mitral valve gradient is 4.0 mmHg. Tricuspid Valve: The tricuspid valve is normal in structure. Tricuspid valve regurgitation is trivial. No evidence of tricuspid stenosis. Aortic Valve: The aortic valve is normal in structure. Aortic valve regurgitation is trivial. No aortic stenosis is present. Aortic valve mean gradient measures 7.0 mmHg. Aortic valve peak gradient measures 13.8 mmHg. Aortic valve area, by VTI measures 3.77 cm. Pulmonic Valve: The pulmonic valve was not well visualized. Pulmonic valve regurgitation is trivial. No evidence of pulmonic stenosis. Aorta: The aortic root is normal in size and structure. Venous: The inferior vena cava is normal in size with greater than 50% respiratory variability, suggesting right atrial pressure of 3 mmHg. IAS/Shunts: No atrial level shunt detected by color flow Doppler.  LEFT VENTRICLE PLAX 2D LVIDd:         4.00 cm     Diastology LVIDs:         2.00 cm     LV e' medial:    4.57 cm/s LV PW:         1.70 cm     LV E/e' medial:  16.3 LV IVS:        1.60 cm     LV e' lateral:   7.83 cm/s LVOT diam:     2.30 cm     LV E/e' lateral: 9.5 LV SV:         106 LV SV Index:   46 LVOT Area:     4.15 cm  LV Volumes (MOD) LV vol d, MOD A2C:  78.9 ml LV vol d,  MOD A4C: 93.0 ml LV vol s, MOD A2C: 23.9 ml LV vol s, MOD A4C: 22.9 ml LV SV MOD A2C:     55.0 ml LV SV MOD A4C:     93.0 ml LV SV MOD BP:      63.6 ml RIGHT VENTRICLE RV Basal diam:  3.70 cm RV Mid diam:    2.90 cm RV S prime:     25.20 cm/s TAPSE (M-mode): 3.7 cm LEFT ATRIUM             Index       RIGHT ATRIUM           Index LA diam:        3.20 cm 1.39 cm/m  RA Area:     13.30 cm LA Vol (A2C):   65.6 ml 28.53 ml/m RA Volume:   31.10 ml  13.53 ml/m LA Vol (A4C):   59.8 ml 26.01 ml/m LA Biplane Vol: 63.6 ml 27.66 ml/m  AORTIC VALVE                    PULMONIC VALVE AV Area (Vmax):    3.19 cm     PV Vmax:       1.64 m/s AV Area (Vmean):   3.23 cm     PV Vmean:      113.000 cm/s AV Area (VTI):     3.77 cm     PV VTI:        0.256 m AV Vmax:           186.00 cm/s  PV Peak grad:  10.8 mmHg AV Vmean:          124.000 cm/s PV Mean grad:  6.0 mmHg AV VTI:            0.282 m AV Peak Grad:      13.8 mmHg AV Mean Grad:      7.0 mmHg LVOT Vmax:         143.00 cm/s LVOT Vmean:        96.300 cm/s LVOT VTI:          0.256 m LVOT/AV VTI ratio: 0.91  AORTA Ao Root diam: 3.20 cm Ao Asc diam:  3.20 cm Ao Arch diam: 3.2 cm MITRAL VALVE                TRICUSPID VALVE MV Area (PHT): 4.63 cm     TR Peak grad:   25.4 mmHg MV Area VTI:   3.39 cm     TR Vmax:        252.00 cm/s MV Peak grad:  13.0 mmHg MV Mean grad:  4.0 mmHg     SHUNTS MV Vmax:       1.80 m/s     Systemic VTI:  0.26 m MV Vmean:      94.4 cm/s    Systemic Diam: 2.30 cm MV Decel Time: 164 msec MV E velocity: 74.30 cm/s MV A velocity: 106.00 cm/s MV E/A ratio:  0.70 Weston Brass MD Electronically signed by Weston Brass MD Signature Date/Time: 06/10/2020/11:44:08 AM    Final    CT HEAD CODE STROKE WO CONTRAST  Result Date: 06/09/2020 CLINICAL DATA:  Code stroke.  Rightward gaze.  Left arm weakness. EXAM: CT HEAD WITHOUT CONTRAST TECHNIQUE: Contiguous axial images were obtained from the base of the skull through the vertex without  intravenous contrast. COMPARISON:  None. FINDINGS: Brain: An acute hemorrhage centered in the right thalamus measures  3.6 x 2.8 x 2.7 cm (approximate volume of 14 mL). There is intraventricular extension with a small amount of hemorrhage in the right greater than left lateral ventricles, third ventricle, and fourth ventricle. The ventricles are not grossly dilated, however prior imaging is not available to no the patient's baseline ventricle size. There is mild edema surrounding the right thalamic hemorrhage. There is 4 mm of leftward midline shift at the level of the thalami. Hypodensities in the cerebral white matter bilaterally are nonspecific but likely reflect mild chronic small vessel ischemic disease given the patient's vascular risk factors. No acute cortically based infarct or extra-axial fluid collection is evident. Vascular: No hyperdense vessel. Skull: No fracture or suspicious osseous lesion. Sinuses/Orbits: Minimal mucosal thickening in the right sphenoid sinus. Clear mastoid air cells. Left cataract extraction. Rightward gaze. Other: None. ASPECTS [facility] Stroke Program Early CT Score) Not scored in the presence of acute hemorrhage. IMPRESSION: 1. 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 2. 4 mm of leftward midline shift. 3. Mild chronic small vessel ischemic disease. These results were communicated to Dr. Wilford Corner at 4:17 pm on 06/09/2020 by text page via the [facility] messaging system. Electronically Signed   By: Sebastian Ache M.D.   On: 06/09/2020 16:21   VAS US CAROTID  Result Date: 06/10/2020 Carotid Arterial Duplex Study Patient Name:  Chesney Bermudez [name]  Date of Exam:   06/10/2020 Medical Rec #: [id number]       Accession #:    [id number] Date of Birth: 02-09-1965       Patient Gender: M Patient Age:   54Y Exam Location:  [facility] Procedure:      VAS US CAROTID Referring Phys: [id number] Marvel Plan  --------------------------------------------------------------------------------  Indications:       CVA. Risk Factors:      Hypertension, hyperlipidemia. Limitations        Today's exam was limited due to the body habitus of the                    patient, the patient's respiratory variation, patient                    movement, patient somnolence and a central line. Comparison Study:  No prior studies. Performing Technologist: Chanda Busing RVT  Examination Guidelines: A complete evaluation includes B-mode imaging, spectral Doppler, color Doppler, and power Doppler as needed of all accessible portions of each vessel. Bilateral testing is considered an integral part of a complete examination. Limited examinations for reoccurring indications may be performed as noted.  Right Carotid Findings: +----------+--------+--------+--------+-----------------------+--------+           PSV cm/sEDV cm/sStenosisPlaque Description     Comments +----------+--------+--------+--------+-----------------------+--------+ CCA Prox  101     12                                              +----------+--------+--------+--------+-----------------------+--------+ CCA Distal82      11              smooth and heterogenous         +----------+--------+--------+--------+-----------------------+--------+ ICA Prox  58      18              smooth and heterogenous         +----------+--------+--------+--------+-----------------------+--------+ ICA Distal57      17  tortuous +----------+--------+--------+--------+-----------------------+--------+ ECA       136     19                                              +----------+--------+--------+--------+-----------------------+--------+ +----------+--------+-------+--------+-------------------+           PSV cm/sEDV cmsDescribeArm Pressure (mmHG) +----------+--------+-------+--------+-------------------+ Subclavian120                                         +----------+--------+-------+--------+-------------------+ +---------+--------+--+--------+--+---------+ VertebralPSV cm/s56EDV cm/s13Antegrade +---------+--------+--+--------+--+---------+  Left Carotid Findings: +----------+--------+--------+--------+-----------------------+--------+           PSV cm/sEDV cm/sStenosisPlaque Description     Comments +----------+--------+--------+--------+-----------------------+--------+ CCA Prox  58      8               smooth and heterogenous         +----------+--------+--------+--------+-----------------------+--------+ CCA Distal69      11              smooth and heterogenous         +----------+--------+--------+--------+-----------------------+--------+ ICA Prox  69      20              smooth and heterogenoustortuous +----------+--------+--------+--------+-----------------------+--------+ ICA Distal112     34                                     tortuous +----------+--------+--------+--------+-----------------------+--------+ ECA       99      14                                              +----------+--------+--------+--------+-----------------------+--------+ +----------+--------+--------+--------+-------------------+           PSV cm/sEDV cm/sDescribeArm Pressure (mmHG) +----------+--------+--------+--------+-------------------+ [id number]                                         +----------+--------+--------+--------+-------------------+ +---------+--------+--+--------+--+---------+ VertebralPSV cm/s41EDV cm/s10Antegrade +---------+--------+--+--------+--+---------+   Summary: Right Carotid: Velocities in the right ICA are consistent with a 1-39% stenosis. Left Carotid: Velocities in the left ICA are consistent with a 1-39% stenosis. Vertebrals: Bilateral vertebral arteries demonstrate antegrade flow. *See table(s) above for measurements and observations.      Preliminary    [place] EKG SITE RITE  Result Date: 06/09/2020 If Site Rite image not attached, placement could not be confirmed due to current cardiac rhythm.   PHYSICAL EXAM  Temp:  [98.1 F (36.7 C)-100.1 F (37.8 C)] 98.1 F (36.7 C) (05/21 0800) Pulse Rate:  [79-106] 94 (05/21 0830) Resp:  [16-34] 24 (05/21 0830) BP: (127-169)/(66-97) 143/76 (05/21 0830) SpO2:  [91 %-100 %] 96 % (05/21 0830)  General -obese middle-aged African-American male.  In mild respiratory distress. Ophthalmologic - fundi not visualized due to noncooperation.  Cardiovascular - Regular rhythm and rate.  Neuro -awake alert and oriented to place and people, but not to age and time. No aphasia, fluent language, following all simple commands, mild   dysarthria. Able to name and repeat. No gaze palsy,  tracking bilaterally, visual field full, PERRL, however with right gaze nystagmus. Left facial droop. Tongue midline. RUE and RLE 5/5, LUE 2/5 and LLE 2-3/5, left sensation loss on the face and LUE and LLE, right FTN intact, gait not tested.     ASSESSMENT/PLAN [name] is a 56 y.o. male with history of hypertension, obesity, cardiomyopathy, hyperlipidemia, CHF admitted for right-sided gaze, right-sided weakness numbness, fall. No tPA given due to ICH.    ICH:  right thalamic ICH with IVH, likely due to uncontrolled hypertension  CT head showed right thalamic ICH with IVH with 4 mm midline shift  Repeat CT head no hematoma expansion  MRI stable right thalamic ICH with IVH and midline shift  MRA no aneurysm or AVM  CT repeat stable right thalamic hemorrhage with mild intraventricular extension.  2D Echo EF 65-70%  CUS unremarkable  LDL 78  HgbA1c 5.5  UDS negative  SCDs for VTE prophylaxis  No antithrombotic prior to admission, now on No antithrombotic due to ICH  Ongoing aggressive stroke risk factor management  Therapy recommendations: Pending  Disposition: Pending  Cerebral  edema  CT and MRI showed midline shift 4mm  CT repeat stable.  On 3% saline @ 75->40 to avoid fluid overload  Na 141-144-147->142  Lasix 40 x 1  Sodium Q6  Hypertensive emergency . Home medication metoprolol 100 twice daily, HCTZ, losartan . Unstable . On Cleviprex, taper off as able . amlodipine 10, metoprolol 100 twice daily . HCTZ 25, losartan 50 twice daily, clonidine 0.1 3 times daily  Long term BP goal normotensive  Hyperlipidemia  Home meds: Lipitor 40  LDL 78, goal < 70  Now on no statin given recent ICH  Resume statin at discharge  AKI  Cre 0.9->1.68->1.19  On IVF   BMP monitoring  Dysphagia  Speech on board  dys 3 and nectar thick -> dys 2 and thin liquid  Aspiration precaution  Low-grade fever  Tmax 101.2->100.2->100.3  Tachycardia   Blood culture no growth   UA WBC 6-10  COVID PCR neg  CT chest basilar atelectasis  Alcohol abuse  About 24 drinks per week  On CIWA protocol  FA/MVI/B1  Other Stroke Risk Factors  Obesity, Body mass index is 36.03 kg/m.   Likely obstructive sleep apnea, undiagnosed    Other Active Problems    Hospital day # 3 Recommend taper and discontinue hypertonic saline as it has been for 5 days since his hemorrhage and patient has short of breath.  Increase clonidine 2.3 mg 3 times daily and continue remaining ongoing blood pressure oral medications and taper Cleviprex drip and discontinue.  Continue physical occupational and speech therapy.  Long discussion with patient and with his wife over the phone and answered questions.  Discussed with pharmacist. This patient is critically ill due to right ICH with IVH, cerebral edema, hypertensive emergency, AKI, alcohol abuse, low-grade fever and at significant risk of neurological worsening, death form hematoma expansion, brain herniation, hydrocephalus, heart failure, renal failure, DT. This patient's care requires constant monitoring of vital signs,  hemodynamics, respiratory and cardiac monitoring, review of multiple databases, neurological assessment, discussion with family, other specialists and medical decision making of high complexity. I spent of neurocritical care time in the care of this patient.   Delia Heady, MD Stroke Neurology 06/12/2020 9:31 AM    To contact Stroke Continuity provider, please refer to [email or web address]. After hours, contact General Neurology

## 2020-06-13 DIAGNOSIS — I610 Nontraumatic intracerebral hemorrhage in hemisphere, subcortical: Secondary | ICD-10-CM | POA: Diagnosis not present

## 2020-06-13 LAB — CBC
HCT: 33.9 % — ABNORMAL LOW (ref 39.0–52.0)
Hemoglobin: 11.2 g/dL — ABNORMAL LOW (ref 13.0–17.0)
MCH: 31.2 pg (ref 26.0–34.0)
MCHC: 33 g/dL (ref 30.0–36.0)
MCV: 94.4 fL (ref 80.0–100.0)
Platelets: 235 10*3/uL (ref 150–400)
RBC: 3.59 MIL/uL — ABNORMAL LOW (ref 4.22–5.81)
RDW: 11.8 % (ref 11.5–15.5)
WBC: 7.5 10*3/uL (ref 4.0–10.5)
nRBC: 0 % (ref 0.0–0.2)

## 2020-06-13 LAB — BASIC METABOLIC PANEL
Anion gap: 6 (ref 5–15)
BUN: 10 mg/dL (ref 6–20)
CO2: 26 mmol/L (ref 22–32)
Calcium: 8.7 mg/dL — ABNORMAL LOW (ref 8.9–10.3)
Chloride: 102 mmol/L (ref 98–111)
Creatinine, Ser: 1.13 mg/dL (ref 0.61–1.24)
GFR, Estimated: >60 mL/min (ref >60)
Glucose, Bld: 119 mg/dL — ABNORMAL HIGH (ref 70–99)
Potassium: 3.6 mmol/L (ref 3.5–5.1)
Sodium: 134 mmol/L — ABNORMAL LOW (ref 135–145)

## 2020-06-13 MED ORDER — HYDRALAZINE HCL 20 MG/ML IJ SOLN
20.0000 mg | INTRAMUSCULAR | Status: DC | PRN
  Administered 2020-06-13 – 2020-06-20 (×4): 20 mg via INTRAVENOUS
  Filled 2020-06-13 (×4): qty 1

## 2020-06-13 NOTE — Progress Notes (Signed)
STROKE TEAM PROGRESS NOTE   SUBJECTIVE (INTERVAL HISTORY) Patient is sitting up in bed.  His wife is at the bedside.     He is awake alert and interactive.  Continues to have mild left hemiparesis.  Blood pressure is controlled but still requires low-dose Cleviprex drip which has just been turned off.Marland Kitchen  He is on multiple oral blood pressure medications.  He is cleared by speech therapy for dysphagia 2 diet and is eating well.  Hypertonic saline drip is off Vital signs stable.  Neurological exam unchanged  OBJECTIVE Temp:  [97.6 F (36.4 C)-99.2 F (37.3 C)] 97.6 F (36.4 C) (05/22 1200) Pulse Rate:  [70-93] 80 (05/22 1400) Cardiac Rhythm: Normal sinus rhythm;Sinus bradycardia (05/22 1200) Resp:  [14-36] 29 (05/22 1400) BP: (116-168)/(71-100) 157/88 (05/22 1400) SpO2:  [90 %-100 %] 98 % (05/22 1400)  Recent Labs  Lab 06/09/20 1607  GLUCAP 123*   Recent Labs  Lab 06/09/20 1602 06/09/20 1608 06/09/20 1809 06/10/20 0724 06/10/20 1658 06/11/20 0433 06/11/20 1030 06/11/20 1600 06/11/20 2233 06/12/20 0420 06/12/20 0953 06/13/20 0412  NA 138 138   < > 144   < > 142   < > 143 142 144 141 134*  K 4.0 3.9  --  4.3  --  4.1  --   --   --  3.3*  --  3.6  CL 102 102  --  114*  --  113*  --   --   --  108  --  102  CO2 25  --   --  24  --  23  --   --   --  27  --  26  GLUCOSE 133* 123*  --  163*  --  146*  --   --   --  111*  --  119*  BUN 14 17  --  16  --  7  --   --   --  6  --  10  CREATININE 1.16 0.90  --  1.68*  --  1.19  --   --   --  1.21  --  1.13  CALCIUM 9.6  --   --  8.5*  --  8.5*  --   --   --  8.6*  --  8.7*   < > = values in this interval not displayed.   Recent Labs  Lab 06/09/20 1602  AST 40  ALT 38  ALKPHOS 66  BILITOT 0.9  PROT 7.6  ALBUMIN 4.2   Recent Labs  Lab 06/09/20 1602 06/09/20 1608 06/10/20 0724 06/11/20 0433 06/12/20 0420 06/13/20 0412  WBC 9.3  --  8.4 10.9* 6.9 7.5  NEUTROABS 7.8*  --   --   --   --   --   HGB 13.8 15.0 12.4* 12.2*  11.1* 11.2*  HCT 41.1 44.0 37.4* 36.6* 34.8* 33.9*  MCV 93.4  --  96.6 97.1 97.2 94.4  PLT 260  --  270 278 216 235   No results for input(s): CKTOTAL, CKMB, CKMBINDEX, TROPONINI in the last 168 hours. No results for input(s): LABPROT, INR in the last 72 hours. No results for input(s): COLORURINE, LABSPEC, PHURINE, GLUCOSEU, HGBUR, BILIRUBINUR, KETONESUR, PROTEINUR, UROBILINOGEN, NITRITE, LEUKOCYTESUR in the last 72 hours.  Invalid input(s): APPERANCEUR     Component Value Date/Time   CHOL 196 06/09/2020 1809   TRIG 153 (H) 06/12/2020 0420   HDL 52 06/09/2020 1809   CHOLHDL 3.8 06/09/2020 1809   VLDL 66 (  H) 06/09/2020 1809   LDLCALC 78 06/09/2020 1809   Lab Results  Component Value Date   HGBA1C 5.5 06/09/2020      Component Value Date/Time   LABOPIA NONE DETECTED 06/09/2020 2256   COCAINSCRNUR NONE DETECTED 06/09/2020 2256   LABBENZ NONE DETECTED 06/09/2020 2256   AMPHETMU NONE DETECTED 06/09/2020 2256   THCU NONE DETECTED 06/09/2020 2256   LABBARB NONE DETECTED 06/09/2020 2256    No results for input(s): ETH in the last 168 hours.  I have personally reviewed the radiological images below and agree with the radiology interpretations.  CT HEAD WO CONTRAST  Result Date: 06/11/2020 CLINICAL DATA:  Left arm weakness. Intracranial hemorrhage follow-up EXAM: CT HEAD WITHOUT CONTRAST TECHNIQUE: Contiguous axial images were obtained from the base of the skull through the vertex without intravenous contrast. COMPARISON:  Head CT 06/10/2020 FINDINGS: Brain: Unchanged intraparenchymal hemorrhage centered in the right thalamus with intraventricular extension. Size and configuration of the ventricles is unchanged. Mild leftward midline shift is also unchanged. No new site of hemorrhage. Vascular: No abnormal hyperdensity of the major intracranial arteries or dural venous sinuses. No intracranial atherosclerosis. Skull: The visualized skull base, calvarium and extracranial soft tissues are  normal. Sinuses/Orbits: No fluid levels or advanced mucosal thickening of the visualized paranasal sinuses. No mastoid or middle ear effusion. The orbits are normal. IMPRESSION: 1. Unchanged size and configuration of right thalamic intraparenchymal hemorrhage with intraventricular extension. 2. Unchanged mild leftward midline shift. Electronically Signed   By: Deatra Robinson M.D.   On: 06/11/2020 18:49   CT HEAD WO CONTRAST  Result Date: 06/09/2020 CLINICAL DATA:  Intracranial hemorrhage follow EXAM: CT HEAD WITHOUT CONTRAST TECHNIQUE: Contiguous axial images were obtained from the base of the skull through the vertex without intravenous contrast. COMPARISON:  06/09/2020 at 4:10 p.m. FINDINGS: Brain: Unchanged appearance of intracranial hemorrhage in the right thalamus with extension into the ventricles. 4 mm leftward midline shift is also unchanged. Vascular: No abnormal hyperdensity of the major intracranial arteries or dural venous sinuses. No intracranial atherosclerosis. Skull: The visualized skull base, calvarium and extracranial soft tissues are normal. Sinuses/Orbits: No fluid levels or advanced mucosal thickening of the visualized paranasal sinuses. No mastoid or middle ear effusion. The orbits are normal. IMPRESSION: 1. Unchanged appearance of right thalamic intracranial hemorrhage with extension into the ventricles. 2. Unchanged 4 mm leftward midline shift. Electronically Signed   By: Deatra Robinson M.D.   On: 06/09/2020 22:18   CT SOFT TISSUE NECK WO CONTRAST  Result Date: 06/09/2020 CLINICAL DATA:  Foreign body in the neck. EXAM: CT NECK WITHOUT CONTRAST TECHNIQUE: Multidetector CT imaging of the neck was performed following the standard protocol without intravenous contrast. COMPARISON:  None. FINDINGS: PHARYNX AND LARYNX: The nasopharynx, oropharynx and larynx are normal. Visible portions of the oral cavity, tongue base and floor of mouth are normal. Normal epiglottis, vallecula and pyriform  sinuses. The larynx is normal. No retropharyngeal abscess, effusion or lymphadenopathy. SALIVARY GLANDS: Normal parotid, submandibular and sublingual glands. THYROID: Normal. LYMPH NODES: No enlarged or abnormal density lymph nodes. VASCULAR: Major cervical vessels are patent. Left IJ central venous catheter. LIMITED INTRACRANIAL: Normal. VISUALIZED ORBITS: Normal. MASTOIDS AND VISUALIZED PARANASAL SINUSES: No fluid levels or advanced mucosal thickening. No mastoid effusion. SKELETON: No bony spinal canal stenosis. No lytic or blastic lesions. UPPER CHEST: Clear. OTHER: None. IMPRESSION: Normal CT of the neck. Electronically Signed   By: Deatra Robinson M.D.   On: 06/09/2020 22:22   CT CHEST WO CONTRAST  Result Date: 06/09/2020 CLINICAL DATA:  Mediastinal widening on chest radiograph, possible foreign body EXAM: CT CHEST WITHOUT CONTRAST TECHNIQUE: Multidetector CT imaging of the chest was performed following the standard protocol without IV contrast. COMPARISON:  Radiograph 06/09/2020, 01/02/2018, contemporary neck radiograph FINDINGS: Cardiovascular: Left IJ approach central venous catheter tip terminates near the level of the left brachiocephalic-superior caval junction. Cardiac size is top normal no sizable pericardial effusion. Atherosclerotic plaque within the normal caliber aorta. No hyperdense mural thickening to suggest an intra mural hematoma. No periaortic stranding or hemorrhage is evident on this unenhanced exam. Central pulmonary arteries are normal caliber. Luminal evaluation of the vasculature is precluded in the absence of contrast media. Mediastinum/Nodes: Some pronounced upper mediastinal widening seen on the chest radiograph is likely a combination of portable technique and abundant mediastinal fat noted on this exam. No mediastinal fluid or gas. Normal thyroid gland and thoracic inlet. No acute abnormality of the trachea or esophagus. No worrisome mediastinal or axillary adenopathy. Hilar  nodal evaluation is limited in the absence of intravenous contrast media. Lungs/Pleura: Low lung volumes and atelectasis with more bandlike subsegmental atelectasis towards the lung bases. No effusion. No pneumothorax. Upper Abdomen: No acute abnormalities present in the visualized portions of the upper abdomen. Musculoskeletal: No acute osseous abnormality or suspicious osseous lesion. Degenerative changes are present in the imaged spine and shoulders. No worrisome chest wall mass. No unexpected radiodense foreign bodies. IMPRESSION: 1. No expected radiopaque foreign body seen in the chest. 2. Left IJ approach central venous catheter tip terminates at the left brachiocephalic-superior caval junction. 3. Mediastinal widening present on comparison chest radiograph is likely a combination artifactual accentuation due to portable technique and abundant mediastinal fat noted on this exam. No worrisome aortic dilatation, or other acute aortic abnormality within the limitations of an unenhanced CT. 4. Basilar atelectatic changes. Electronically Signed   By: Price  DeHay M.D.   On: 06/09/2020 23:26   MR ANGIO HEAD WO CONTRAST  Result Date: 06/10/2020 CLINICAL DATA:  Cerebral hemorrhage. EXAM: MRA HEAD WITHOUT CONTRAST TECHNIQUE: Angiographic images of the Circle of Willis were acquired using MRA technique without intravenous contrast. COMPARISON:  No pertinent prior exam. FINDINGS: Motion limited exam.  Within this limitation: Anterior circulation: No large vessel occlusion or evidence of proximal hemodynamically significant stenosis. No evidence of hemodynamically significant proximal stenosis, although evaluation limited by motion. No aneurysm identified. No evidence of vascular malformation in the region of the right thalamic hemorrhage, although acute blood products limit evaluation. There is intrinsic T1 hyperintensity within the hemorrhage, which also limits evaluation. Posterior circulation: No large vessel  occlusion or evidence of proximal hemodynamically significant stenosis. Bilateral posterior communicating arteries. No aneurysm. IMPRESSION: 1. No evidence of a vascular malformation in the region of the right thalamic hemorrhage, although acute blood products limit evaluation. 2. No large vessel occlusion or evidence of proximal hemodynamically significant stenosis on this motion limited exam. Electronically Signed   By: Frederick S Jones MD   On: 06/10/2020 12:02   MR BRAIN W WO CONTRAST  Result Date: 06/10/2020 CLINICAL DATA:  Hemorrhage. EXAM: MRI HEAD WITHOUT AND WITH CONTRAST TECHNIQUE: Multiplanar, multiecho pulse sequences of the brain and surrounding structures were obtained without and with intravenous contrast. CONTRAST:  [id number] GADAVIST GADOBUTROL 1 MMOL/ML IV SOLN COMPARISON:  CT Jun 09, 2020. FINDINGS: Mildly motion limited exam. Brain: When comparing across modalities, no substantial change in size/extent of an acute right thalamic hemorrhage, measuring up to approximately 3.8 by 2.4 cm. The hemorrhage demonstrates  areas of intrinsic T1 hyperintensity. Similar extraventricular extension with blood products in right greater than left lateral ventricles, third ventricle, and fourth ventricle. No progressive ventriculomegaly. Surrounding edema with similar 4 mm of leftward midline shift. Outside of the hemorrhage itself, and no confluent restricted diffusion to suggest acute infarct. No visible mass lesion, although acute blood products limit evaluation. Moderate patchy T2/FLAIR hyperintensities within the white matter and pons, nonspecific but most likely related to chronic microvascular ischemic disease. Vascular: Further evaluated on concurrent MRA. Skull and upper cervical spine: Normal marrow signal. Sinuses/Orbits: Clear sinuses.  No acute orbital finding. Other: No mastoid effusions. IMPRESSION: 1. When comparing across modalities, no substantial change in size/extent of an acute right thalamic  hemorrhage with intraventricular extension, as detailed above. Similar 4 mm of leftward midline shift. No progressive ventriculomegaly. 2. No specific evidence of acute infarct or visible mass lesion, although acute blood products limits evaluation. A follow-up MRI after resolution of hemorrhage could further evaluate if clinically indicated. 3. Moderate chronic microvascular ischemic disease. Electronically Signed   By: Feliberto Harts MD   On: 06/10/2020 11:51   DG Chest Portable 1 View  Result Date: 06/09/2020 CLINICAL DATA:  55 year old male with history of central line placement. EXAM: PORTABLE CHEST 1 VIEW COMPARISON:  Chest x-ray 01/02/2018. FINDINGS: Left-sided internal jugular central venous catheter with tip terminating near the junction of the innominate vein with the superior vena cava. Lung volumes are low. No consolidative airspace disease. No pleural effusions. No pneumothorax. No evidence of pulmonary edema. Heart size is mildly enlarged. Marked widening of the mediastinum, new compared to the prior examination. IMPRESSION: 1. Left internal jugular central venous catheter in position, as above, with profound widening of the mediastinum. If there is clinical concern for aortic dissection and/or mediastinal hematoma, further evaluation with chest CTA would be recommended. Electronically Signed   By: Trudie Reed M.D.   On: 06/09/2020 18:11   ECHOCARDIOGRAM COMPLETE  Result Date: 06/10/2020    ECHOCARDIOGRAM REPORT   Patient Name:   Amani Nodarse III Date of Exam: 06/10/2020 Medical Rec #:  [id number]      Height:       70.0 in Accession #:    [id number]     Weight:       251.1 lb Date of Birth:  January 13, 1966      BSA:          2.299 m Patient Age:    54 years       BP:           140/71 mmHg Patient Gender: M              HR:           102 bpm. Exam Location:  Inpatient Procedure: 2D Echo, Color Doppler and Cardiac Doppler Indications:    CVA  History:        Patient has prior history of  Echocardiogram examinations, most                 recent 08/12/2014. Cardiomyopathy and CHF,                 Signs/Symptoms:Shortness of Breath; Risk Factors:Hypertension,                 Dyslipidemia and Diabetes.  Sonographer:    Neomia Dear RDCS Referring Phys: [id number] ASHISH ARORA  Sonographer Comments: Patient is morbidly obese and Technically difficult study due to poor echo windows. IMPRESSIONS  1. Left ventricular  ejection fraction, by estimation, is 65 to 70%. The left ventricle has normal function. The left ventricle has no regional wall motion abnormalities. There is severe left ventricular hypertrophy. Indeterminate diastolic filling due to E-A fusion.  2. Right ventricular systolic function is normal. The right ventricular size is normal. There is normal pulmonary artery systolic pressure. The estimated right ventricular systolic pressure is 28.4 mmHg.  3. The mitral valve is normal in structure. Trivial mitral valve regurgitation. No evidence of mitral stenosis.  4. The aortic valve is normal in structure. Aortic valve regurgitation is trivial. No aortic stenosis is present.  5. The inferior vena cava is normal in size with greater than 50% respiratory variability, suggesting right atrial pressure of 3 mmHg.  6. Increased flow velocities may be secondary to anemia, thyrotoxicosis, hyperdynamic or high flow state. Conclusion(s)/Recommendation(s): No intracardiac source of embolism detected on this transthoracic study. A transesophageal echocardiogram is recommended to exclude cardiac source of embolism if clinically indicated. FINDINGS  Left Ventricle: Left ventricular ejection fraction, by estimation, is 65 to 70%. The left ventricle has normal function. The left ventricle has no regional wall motion abnormalities. The left ventricular internal cavity size was normal in size. There is  severe left ventricular hypertrophy. Indeterminate diastolic filling due to E-A fusion. Right Ventricle: The right  ventricular size is normal. No increase in right ventricular wall thickness. Right ventricular systolic function is normal. There is normal pulmonary artery systolic pressure. The tricuspid regurgitant velocity is 2.52 m/s, and  with an assumed right atrial pressure of 3 mmHg, the estimated right ventricular systolic pressure is 28.4 mmHg. Left Atrium: Left atrial size was normal in size. Right Atrium: Right atrial size was normal in size. Pericardium: There is no evidence of pericardial effusion. Mitral Valve: The mitral valve is normal in structure. Trivial mitral valve regurgitation. No evidence of mitral valve stenosis. MV peak gradient, 13.0 mmHg. The mean mitral valve gradient is 4.0 mmHg. Tricuspid Valve: The tricuspid valve is normal in structure. Tricuspid valve regurgitation is trivial. No evidence of tricuspid stenosis. Aortic Valve: The aortic valve is normal in structure. Aortic valve regurgitation is trivial. No aortic stenosis is present. Aortic valve mean gradient measures 7.0 mmHg. Aortic valve peak gradient measures 13.8 mmHg. Aortic valve area, by VTI measures 3.77 cm. Pulmonic Valve: The pulmonic valve was not well visualized. Pulmonic valve regurgitation is trivial. No evidence of pulmonic stenosis. Aorta: The aortic root is normal in size and structure. Venous: The inferior vena cava is normal in size with greater than 50% respiratory variability, suggesting right atrial pressure of 3 mmHg. IAS/Shunts: No atrial level shunt detected by color flow Doppler.  LEFT VENTRICLE PLAX 2D LVIDd:         4.00 cm     Diastology LVIDs:         2.00 cm     LV e' medial:    4.57 cm/s LV PW:         1.70 cm     LV E/e' medial:  16.3 LV IVS:        1.60 cm     LV e' lateral:   7.83 cm/s LVOT diam:     2.30 cm     LV E/e' lateral: 9.5 LV SV:         106 LV SV Index:   46 LVOT Area:     4.15 cm  LV Volumes (MOD) LV vol d, MOD A2C: 78.9 ml LV vol d, MOD A4C: 93.0  ml LV vol s, MOD A2C: 23.9 ml LV vol s, MOD A4C:  22.9 ml LV SV MOD A2C:     55.0 ml LV SV MOD A4C:     93.0 ml LV SV MOD BP:      63.6 ml RIGHT VENTRICLE RV Basal diam:  3.70 cm RV Mid diam:    2.90 cm RV S prime:     25.20 cm/s TAPSE (M-mode): 3.7 cm LEFT ATRIUM             Index       RIGHT ATRIUM           Index LA diam:        3.20 cm 1.39 cm/m  RA Area:     13.30 cm LA Vol (A2C):   65.6 ml 28.53 ml/m RA Volume:   31.10 ml  13.53 ml/m LA Vol (A4C):   59.8 ml 26.01 ml/m LA Biplane Vol: 63.6 ml 27.66 ml/m  AORTIC VALVE                    PULMONIC VALVE AV Area (Vmax):    3.19 cm     PV Vmax:       1.64 m/s AV Area (Vmean):   3.23 cm     PV Vmean:      113.000 cm/s AV Area (VTI):     3.77 cm     PV VTI:        0.256 m AV Vmax:           186.00 cm/s  PV Peak grad:  10.8 mmHg AV Vmean:          124.000 cm/s PV Mean grad:  6.0 mmHg AV VTI:            0.282 m AV Peak Grad:      13.8 mmHg AV Mean Grad:      7.0 mmHg LVOT Vmax:         143.00 cm/s LVOT Vmean:        96.300 cm/s LVOT VTI:          0.256 m LVOT/AV VTI ratio: 0.91  AORTA Ao Root diam: 3.20 cm Ao Asc diam:  3.20 cm Ao Arch diam: 3.2 cm MITRAL VALVE                TRICUSPID VALVE MV Area (PHT): 4.63 cm     TR Peak grad:   25.4 mmHg MV Area VTI:   3.39 cm     TR Vmax:        252.00 cm/s MV Peak grad:  13.0 mmHg MV Mean grad:  4.0 mmHg     SHUNTS MV Vmax:       1.80 m/s     Systemic VTI:  0.26 m MV Vmean:      94.4 cm/s    Systemic Diam: 2.30 cm MV Decel Time: 164 msec MV E velocity: 74.30 cm/s MV A velocity: 106.00 cm/s MV E/A ratio:  0.70 Weston Brass MD Electronically signed by Weston Brass MD Signature Date/Time: 06/10/2020/11:44:08 AM    Final    CT HEAD CODE STROKE WO CONTRAST  Result Date: 06/09/2020 CLINICAL DATA:  Code stroke.  Rightward gaze.  Left arm weakness. EXAM: CT HEAD WITHOUT CONTRAST TECHNIQUE: Contiguous axial images were obtained from the base of the skull through the vertex without intravenous contrast. COMPARISON:  None. FINDINGS: Brain: An acute hemorrhage centered  in the right thalamus measures 3.6 x  2.8 x 2.7 cm (approximate volume of 14 mL). There is intraventricular extension with a small amount of hemorrhage in the right greater than left lateral ventricles, third ventricle, and fourth ventricle. The ventricles are not grossly dilated, however prior imaging is not available to no the patient's baseline ventricle size. There is mild edema surrounding the right thalamic hemorrhage. There is 4 mm of leftward midline shift at the level of the thalami. Hypodensities in the cerebral white matter bilaterally are nonspecific but likely reflect mild chronic small vessel ischemic disease given the patient's vascular risk factors. No acute cortically based infarct or extra-axial fluid collection is evident. Vascular: No hyperdense vessel. Skull: No fracture or suspicious osseous lesion. Sinuses/Orbits: Minimal mucosal thickening in the right sphenoid sinus. Clear mastoid air cells. Left cataract extraction. Rightward gaze. Other: None. ASPECTS [facility]([place] Stroke Program Early CT Score) Not scored in the presence of acute hemorrhage. IMPRESSION: 1. 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 2. 4 mm of leftward midline shift. 3. Mild chronic small vessel ischemic disease. These results were communicated to Dr. Wilford CornerArora at 4:17 pm on 06/09/2020 by text page via the [facility] messaging system. Electronically Signed   By: Sebastian AcheAllen  Grady M.D.   On: 06/09/2020 16:21   VAS US CAROTID  Result Date: 06/12/2020 Carotid Arterial Duplex Study Patient Name:  Addison Naegeliarl Leveque III  Date of Exam:   06/10/2020 Medical Rec #: [id number]       Accession #:    [id number] Date of Birth: Jan 06, 1966       Patient Gender: M Patient Age:   54Y [facility] Procedure:      VAS US CAROTID Referring Phys: [id number] Marvel PlanJINDONG XU --------------------------------------------------------------------------------  Indications:       CVA. Risk Factors:      Hypertension, hyperlipidemia.  Limitations        Today's exam was limited due to the body habitus of the                    patient, the patient's respiratory variation, patient                    movement, patient somnolence and a central line. Comparison Study:  No prior studies. Performing Technologist: Chanda BusingGregory Collins RVT  Examination Guidelines: A complete evaluation includes B-mode imaging, spectral Doppler, color Doppler, and power Doppler as needed of all accessible portions of each vessel. Bilateral testing is considered an integral part of a complete examination. Limited examinations for reoccurring indications may be performed as noted.  Right Carotid Findings: +----------+--------+--------+--------+-----------------------+--------+           PSV cm/sEDV cm/sStenosisPlaque Description     Comments +----------+--------+--------+--------+-----------------------+--------+ CCA Prox  101     12                                              +----------+--------+--------+--------+-----------------------+--------+ CCA Distal82      11              smooth and heterogenous         +----------+--------+--------+--------+-----------------------+--------+ ICA Prox  58      18              smooth and heterogenous         +----------+--------+--------+--------+-----------------------+--------+ ICA Distal57      17  tortuous +----------+--------+--------+--------+-----------------------+--------+ ECA       136     19                                              +----------+--------+--------+--------+-----------------------+--------+ +----------+--------+-------+--------+-------------------+           PSV cm/sEDV cmsDescribeArm Pressure (mmHG) +----------+--------+-------+--------+-------------------+ Subclavian120                                        +----------+--------+-------+--------+-------------------+ +---------+--------+--+--------+--+---------+  VertebralPSV cm/s56EDV cm/s13Antegrade +---------+--------+--+--------+--+---------+  Left Carotid Findings: +----------+--------+--------+--------+-----------------------+--------+           PSV cm/sEDV cm/sStenosisPlaque Description     Comments +----------+--------+--------+--------+-----------------------+--------+ CCA Prox  58      8               smooth and heterogenous         +----------+--------+--------+--------+-----------------------+--------+ CCA Distal69      11              smooth and heterogenous         +----------+--------+--------+--------+-----------------------+--------+ ICA Prox  69      20              smooth and heterogenoustortuous +----------+--------+--------+--------+-----------------------+--------+ ICA Distal112     34                                     tortuous +----------+--------+--------+--------+-----------------------+--------+ ECA       99      14                                              +----------+--------+--------+--------+-----------------------+--------+ +----------+--------+--------+--------+-------------------+           PSV cm/sEDV cm/sDescribeArm Pressure (mmHG) +----------+--------+--------+--------+-------------------+ [id number]                                         +----------+--------+--------+--------+-------------------+ +---------+--------+--+--------+--+---------+ VertebralPSV cm/s41EDV cm/s10Antegrade +---------+--------+--+--------+--+---------+   Summary: Right Carotid: Velocities in the right ICA are consistent with a 1-39% stenosis. Left Carotid: Velocities in the left ICA are consistent with a 1-39% stenosis. Vertebrals: Bilateral vertebral arteries demonstrate antegrade flow. *See table(s) above for measurements and observations.  Electronically signed by Delia Heady MD on 06/12/2020 at 2:03:49 PM.    Final    [place] EKG SITE RITE  Result Date: 06/09/2020 If Site Rite image not  attached, placement could not be confirmed due to current cardiac rhythm.   PHYSICAL EXAM  Temp:  [97.6 F (36.4 C)-99.2 F (37.3 C)] 97.6 F (36.4 C) (05/22 1200) Pulse Rate:  [70-93] 80 (05/22 1400) Resp:  [14-36] 29 (05/22 1400) BP: (116-168)/(71-100) 157/88 (05/22 1400) SpO2:  [90 %-100 %] 98 % (05/22 1400)  General -obese middle-aged African-American male.  In mild respiratory distress. Ophthalmologic - fundi not visualized due to noncooperation.  Cardiovascular - Regular rhythm and rate.  Neuro -awake alert and oriented to place and people, but not to age and time. No aphasia, fluent language, following all simple commands, mild  dysarthria. Able to name and repeat. No gaze palsy, tracking bilaterally, visual field full, PERRL, however with right gaze nystagmus. Left facial droop. Tongue midline. RUE and RLE 5/5, LUE 2/5 and LLE 2-3/5, left sensation loss on the face and LUE and LLE, right FTN intact, gait not tested.     ASSESSMENT/PLAN Mr. Nic Lerner III is a 55 y.o. male with history of hypertension, obesity, cardiomyopathy, hyperlipidemia, CHF admitted for right-sided gaze, right-sided weakness numbness, fall. No tPA given due to ICH.    ICH:  right thalamic ICH with IVH, likely due to uncontrolled hypertension  CT head showed right thalamic ICH with IVH with 4 mm midline shift  Repeat CT head no hematoma expansion  MRI stable right thalamic ICH with IVH and midline shift  MRA no aneurysm or AVM  CT repeat stable right thalamic hemorrhage with mild intraventricular extension.  2D Echo EF 65-70%  CUS unremarkable  LDL 78  HgbA1c 5.5  UDS negative  SCDs for VTE prophylaxis  No antithrombotic prior to admission, now on No antithrombotic due to ICH  Ongoing aggressive stroke risk factor management  Therapy recommendations: Pending  Disposition: Pending  Cerebral edema  CT and MRI showed midline shift 89mm  CT repeat stable.  On 3% saline @ 75->40  to avoid fluid overload  Na 141-144-147->142  Lasix 40 x 1  Sodium Q6  Hypertensive emergency . Home medication metoprolol 100 twice daily, HCTZ, losartan . Unstable . On Cleviprex, taper off as able . amlodipine 10, metoprolol 100 twice daily . HCTZ 25, losartan 50 twice daily, clonidine 0.1 3 times daily  Long term BP goal normotensive  Hyperlipidemia  Home meds: Lipitor 40  LDL 78, goal < 70  Now on no statin given recent ICH  Resume statin at discharge  AKI  Cre 0.9->1.68->1.19  On IVF   BMP monitoring  Dysphagia  Speech on board  dys 3 and nectar thick -> dys 2 and thin liquid  Aspiration precaution  Low-grade fever  Tmax 101.2->100.2->100.3  Tachycardia   Blood culture no growth   UA WBC 6-10  COVID PCR neg  CT chest basilar atelectasis  Alcohol abuse  About 24 drinks per week  On CIWA protocol  FA/MVI/B1  Other Stroke Risk Factors  Obesity, Body mass index is 36.03 kg/m.   Likely obstructive sleep apnea, undiagnosed    Other Active Problems    Hospital day # 4 Continue to use as needed IV labetalol and hydralazine and keep Cleviprex drip off.  Mobilize out of bed.  Continue ongoing therapies.  Consult medical hospitalist team to resume medical care once he leaves the ICU.   Plan to transfer to neurology floor bed later today if blood pressure remains under control..  Long discussion with patient and with his wife over the phone and answered questions.    This patient is critically ill due to right ICH with IVH, cerebral edema, hypertensive emergency, AKI, alcohol abuse, low-grade fever and at significant risk of neurological worsening, death form hematoma expansion, brain herniation, hydrocephalus, heart failure, renal failure, DT. This patient's care requires constant monitoring of vital signs, hemodynamics, respiratory and cardiac monitoring, review of multiple databases, neurological assessment, discussion with family, other  specialists and medical decision making of high complexity. I spent of neurocritical care time in the care of this patient.   Delia Heady, MD Stroke Neurology 06/13/2020 2:56 PM    To contact Stroke Continuity provider, please refer to [email or web address]. After hours, contact General  Neurology

## 2020-06-13 NOTE — Progress Notes (Signed)
Inpatient Rehab Admissions:  Inpatient Rehab Consult received.  I met with patient at the bedside for rehabilitation assessment and to discuss goals and expectations of an inpatient rehab admission.  He acknowledged understanding. He expressed interest in pursuing CIR.  Pt gave permission to contact wife, [name].  Spoke with her on the telephone.  Explained CIR goals and expectations. She acknowledged understanding and is supportive of pt pursuing CIR.  Will continue to follow.  Signed: Gayland Curry, [place], [place] Admissions Coordinator [phone]

## 2020-06-13 NOTE — Consult Note (Addendum)
Physical Medicine and Rehabilitation Consult Reason for Consult: Left side weakness Referring Physician: Dr. Pearlean Brownie   HPI: [name] is a 55 y.o. right-handed male with past medical history of hypertension, obesity with BMI 36.03, cardiomyopathy, hyperlipidemia, diastolic congestive heart failure..  Per chart review lives with spouse.  1 level home with 3 steps to entry.  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  Presented 06/09/2020 with acute onset of left-sided weakness and right gaze.  Noted BP systolic in the 200s-230s.  Cranial CT scan showed acute right thalamic hemorrhage with intraventricular extension 4 mm leftward midline shift.  Echocardiogram with ejection fraction of 65 to 70% no wall motion abnormalities.  Admission chemistries unremarkable except glucose 133, TSH 0.483, hemoglobin A1c 5.5, urine drug screen negative.  Follow-up MRI showed no substantial change in size extent of acute thalamic hemorrhage with intraventricular extension midline shift unchanged.  No progressive ventriculomegaly.  MRA with no evidence of vascular malformation.  No large vessel occlusion.  Neurology follow-up conservative care monitoring of blood pressure initially maintained on Cleviprex and also initially maintained as well as hypertonic saline.Marland Kitchen  He was cleared to begin Lovenox for DVT prophylaxis 06/12/2020.  Dysphagia #2 thin liquid diet.  Due to patient's left-sided weakness decreased functional mobility recommendations of physical medicine rehab consult. Patient is awake but drifts off to sleep.    Review of Systems  Constitutional: Negative for chills and fever.  HENT: Negative for hearing loss.   Eyes: Negative for blurred vision and double vision.  Respiratory: Negative for cough.        Occasional shortness of breath with heavy exertion.  Cardiovascular: Positive for leg swelling. Negative for chest pain and palpitations.  Gastrointestinal: Positive for  constipation. Negative for heartburn, nausea and vomiting.  Genitourinary: Negative for dysuria, flank pain and hematuria.  Skin: Negative for rash.  Neurological: Positive for weakness.  All other systems reviewed and are negative.  Past Medical History:  Diagnosis Date  . Chronic combined systolic and diastolic CHF (congestive heart failure) (HCC)    EF 25-30%  . Diastolic dysfunction   . Dyslipidemia   . Hyperlipidemia   . Hypertension   . Nonischemic dilated cardiomyopathy (HCC)    felt secondary to HTN with no ischemia on nuclear stress test 05/2012, EF 25-30%  . Shortness of breath    Past Surgical History:  Procedure Laterality Date  . KNEE ARTHROSCOPY Left 04/13/2016   Procedure: ARTHROSCOPY KNEE WITH DEBRIDEMENT;  Surgeon: Valeria Batman, MD;  Location: [facility];  Service: Orthopedics;  Laterality: Left;  . KNEE ARTHROSCOPY WITH MEDIAL MENISECTOMY Right 04/13/2016   Procedure: KNEE ARTHROSCOPY WITH MEDIAL MENISECTOMY;  Surgeon: Valeria Batman, MD;  Location:  SURGERY CENTER;  Service: Orthopedics;  Laterality: Right;  . NASAL FRACTURE SURGERY     Family History  Problem Relation Age of Onset  . Hypertension Mother        Passed away when she is 4 secondary to hip surgery complications  . CAD Mother   . Heart attack Mother   . Hypertension Father   . Heart attack Maternal Grandmother    Social History:  reports that he has never smoked. He has never used smokeless tobacco. He reports current alcohol use of about 24.0 standard drinks of alcohol per week. He reports that he does not use drugs. Allergies: No Known Allergies Medications Prior to Admission  Medication Sig Dispense Refill  . atorvastatin (LIPITOR) 40  MG tablet Take 1 tablet (40 mg total) by mouth daily at 6 PM. 90 tablet 1  . hydrochlorothiazide (HYDRODIURIL) 12.5 MG tablet Take 1 tablet (12.5 mg total) by mouth every other day. 90 tablet 1  . ibuprofen (ADVIL) 200 MG tablet  Take 800 mg by mouth every 6 (six) hours as needed for moderate pain.    . metoprolol tartrate (LOPRESSOR) 100 MG tablet Take 1 tablet (100 mg total) by mouth 2 (two) times daily. 180 tablet 1  . Multiple Vitamin (MULTIVITAMIN WITH MINERALS) TABS tablet Take 1 tablet by mouth daily.    Marland Kitchen losartan (COZAAR) 50 MG tablet Take 1 tablet (50 mg total) by mouth daily. 90 tablet 1  . predniSONE (DELTASONE) 10 MG tablet 6 tabs po day 1, 5 tabs po day 2, 4 tabs po day 3, 3 tabs po day 4, 2 tabs po day 5, 1 tab po day 6 (Patient not taking: No sig reported) 21 tablet 0    Home: Home Living Family/patient expects to be discharged to:: Private residence Living Arrangements: Spouse/significant other Available Help at Discharge: Family,Available 24 hours/day (wife works but daughter can assist also) Type of Home: House Home Access: Stairs to enter Secretary/administrator of Steps: 3 Entrance Stairs-Rails: Left (ascending) Home Layout: One level Bathroom Shower/Tub: Engineer, manufacturing systems: Standard Home Equipment: None  Lives With: Spouse  Functional History: Prior Function Level of Independence: Independent Comments: Pt drives a forklift. Functional Status:  Mobility: Bed Mobility Overal bed mobility: Needs Assistance Bed Mobility: Supine to Sit,Sit to Supine Supine to sit: Max assist,+2 for physical assistance,+2 for safety/equipment,HOB elevated Sit to supine: Total assist,+2 for physical assistance,+2 for safety/equipment General bed mobility comments: Pt very lethargic; started to get very lethargic after 5 mins at EOB Transfers Overall transfer level: Needs assistance Equipment used: 2 person hand held assist Transfers: Sit to/from Stand Sit to Stand: Max assist,+2 physical assistance General transfer comment: no +2 available, DNT Ambulation/Gait General Gait Details: Unable    ADL: ADL Overall ADL's : Needs assistance/impaired Eating/Feeding: NPO Grooming: Minimal  assistance Upper Body Bathing: Moderate assistance Lower Body Bathing: Total assistance Upper Body Dressing : Moderate assistance Lower Body Dressing: Total assistance Toilet Transfer: Total assistance,+2 for physical assistance,+2 for safety/equipment Toilet Transfer Details (indicate cue type and reason): unable to simulate without +2 Toileting- Clothing Manipulation and Hygiene: Total assistance,+2 for physical assistance,+2 for safety/equipment,Bed level Functional mobility during ADLs: Maximal assistance,+2 for physical assistance,+2 for safety/equipment,Cueing for safety General ADL Comments: Pt limited by decreased strength, poor ability to care for self, decreased cognition and decreased endurance.  Cognition: Cognition Overall Cognitive Status: Impaired/Different from baseline Arousal/Alertness: Awake/alert Orientation Level: Oriented X4 Attention: Sustained Sustained Attention: Impaired Sustained Attention Impairment: Verbal basic Memory: Impaired Memory Impairment: Retrieval deficit Awareness: Impaired Awareness Impairment: Emergent impairment,Anticipatory impairment Problem Solving: Impaired Problem Solving Impairment: Verbal basic Executive Function: Sequencing,Organizing Behaviors: Impulsive Safety/Judgment: Impaired Cognition Arousal/Alertness: Lethargic Behavior During Therapy: Impulsive Overall Cognitive Status: Impaired/Different from baseline Area of Impairment: Attention,Memory,Following commands,Safety/judgement,Awareness,Problem solving Current Attention Level: Alternating Memory: Decreased short-term memory Following Commands: Follows one step commands inconsistently,Follows one step commands with increased time Safety/Judgement: Decreased awareness of safety,Decreased awareness of deficits Awareness: Intellectual Problem Solving: Slow processing,Decreased initiation,Difficulty sequencing,Requires verbal cues,Requires tactile cues General Comments: Pt  continues to be lethargic, following commands with increased effort and requiring cues for safety awareness and deficits. pt A/O x4.  Blood pressure 139/84, pulse 71, temperature 97.6 F (36.4 C), temperature source Oral, resp. rate (!) 23,  height 5\' 10"  (1.778 m), weight 113.9 kg, SpO2 96 %. Physical Exam Vitals and nursing note reviewed.  Constitutional:      Appearance: He is obese.  HENT:     Head: Normocephalic and atraumatic.  Eyes:     Extraocular Movements: Extraocular movements intact.     Conjunctiva/sclera: Conjunctivae normal.     Pupils: Pupils are equal, round, and reactive to light.  Cardiovascular:     Rate and Rhythm: Normal rate and regular rhythm.     Heart sounds: Normal heart sounds. No murmur heard.   Pulmonary:     Effort: Pulmonary effort is normal.     Breath sounds: Normal breath sounds.  Abdominal:     General: Abdomen is flat. Bowel sounds are normal. There is no distension.     Palpations: Abdomen is soft.  Musculoskeletal:     Cervical back: No tenderness.     Right lower leg: Edema present.     Left lower leg: Edema present.     Comments: No pain with upper extremity or lower extremity range of motion  Skin:    General: Skin is warm and dry.  Neurological:     General: No focal deficit present.     Motor: Weakness present. No tremor.     Gait: Gait abnormal.     Comments: Patient is alert in no acute distress.  Makes eye contact with examiner.  Oriented to person and place but not age and time needing some cues.  Follows simple commands.  Motor strength is 5/5 in the right deltoid, bicep, tricep, grip, hip flexor, knee extensor, ankle dorsiflexor and plantar flexor 0/5 in the left deltoid, bicep, tricep, grip, hip flexor, knee extensor, ankle dorsiflexion plantarflexion There is decreased sensation to light touch pinch and proprioception in the left upper limb left lower limb  Psychiatric:        Mood and Affect: Mood normal.     Results  for orders placed or performed during the hospital encounter of 06/09/20 (from the past 24 hour(s))  Basic metabolic panel     Status: Abnormal   Collection Time: 06/13/20  4:12 AM  Result Value Ref Range   Sodium 134 (L) 135 - 145 mmol/L   Potassium 3.6 3.5 - 5.1 mmol/L   Chloride 102 98 - 111 mmol/L   CO2 26 22 - 32 mmol/L   Glucose, Bld 119 (H) 70 - 99 mg/dL   BUN 10 6 - 20 mg/dL   Creatinine, Ser 06/15/20 0.61 - 1.24 mg/dL   Calcium 8.7 (L) 8.9 - 10.3 mg/dL   GFR, Estimated 7.82 >42 mL/min   Anion gap 6 5 - 15  CBC     Status: Abnormal   Collection Time: 06/13/20  4:12 AM  Result Value Ref Range   WBC 7.5 4.0 - 10.5 K/uL   RBC 3.59 (L) 4.22 - 5.81 MIL/uL   Hemoglobin 11.2 (L) 13.0 - 17.0 g/dL   HCT 06/15/20 (L) 36.1 - 44.3 %   MCV 94.4 80.0 - 100.0 fL   MCH 31.2 26.0 - 34.0 pg   MCHC 33.0 30.0 - 36.0 g/dL   RDW 15.4 00.8 - 67.6 %   Platelets 235 150 - 400 K/uL   nRBC 0.0 0.0 - 0.2 %   CT HEAD WO CONTRAST  Result Date: 06/11/2020 CLINICAL DATA:  Left arm weakness. Intracranial hemorrhage follow-up EXAM: CT HEAD WITHOUT CONTRAST TECHNIQUE: Contiguous axial images were obtained from the base of the skull through the  vertex without intravenous contrast. COMPARISON:  Head CT 06/10/2020 FINDINGS: Brain: Unchanged intraparenchymal hemorrhage centered in the right thalamus with intraventricular extension. Size and configuration of the ventricles is unchanged. Mild leftward midline shift is also unchanged. No new site of hemorrhage. Vascular: No abnormal hyperdensity of the major intracranial arteries or dural venous sinuses. No intracranial atherosclerosis. Skull: The visualized skull base, calvarium and extracranial soft tissues are normal. Sinuses/Orbits: No fluid levels or advanced mucosal thickening of the visualized paranasal sinuses. No mastoid or middle ear effusion. The orbits are normal. IMPRESSION: 1. Unchanged size and configuration of right thalamic intraparenchymal hemorrhage with  intraventricular extension. 2. Unchanged mild leftward midline shift. Electronically Signed   By: Deatra Robinson M.D.   On: 06/11/2020 18:49     Assessment/Plan: Diagnosis: Right thalamic hemorrhage 1. Does the need for close, 24 hr/day medical supervision in concert with the patient's rehab needs make it unreasonable for this patient to be served in a less intensive setting? Yes 2. Co-Morbidities requiring supervision/potential complications: Morbid obesity, hypertension, dilated cardiomyopathy 3. Due to bladder management, bowel management, safety, skin/wound care, disease management, medication administration, pain management and patient education, does the patient require 24 hr/day rehab nursing? Yes 4. Does the patient require coordinated care of a physician, rehab nurse, therapy disciplines of PT, OT, speech to address physical and functional deficits in the context of the above medical diagnosis(es)? Yes Addressing deficits in the following areas: balance, endurance, locomotion, strength, transferring, bowel/bladder control, bathing, dressing, feeding, grooming, toileting, cognition and psychosocial support 5. Can the patient actively participate in an intensive therapy program of at least 3 hrs of therapy per day at least 5 days per week? Yes 6. The potential for patient to make measurable gains while on inpatient rehab is good 7. Anticipated functional outcomes upon discharge from inpatient rehab are mod assist  with PT, mod assist with OT, supervision with SLP. 8. Estimated rehab length of stay to reach the above functional goals is: 21 to 23 days 9. Anticipated discharge destination: Home 10. Overall Rehab/Functional Prognosis: good  RECOMMENDATIONS: This patient's condition is appropriate for continued rehabilitative care in the following setting: CIR Patient has agreed to participate in recommended program. Potentially Note that insurance prior authorization may be required for  reimbursement for recommended care.  Comment: Need to confirm 24/7 caregiver post discharge   Charlton Amor, PA-C 06/13/2020   "I have personally performed a face to face diagnostic evaluation of this patient.  Additionally, I have reviewed and concur with the physician assistant's documentation above." Erick Colace M.D. [facility] Fellow Am [employer] of [employer] Med and Rehab Diplomate Am Board of Electrodiagnostic Med Fellow Am Board of Interventional Pain

## 2020-06-14 DIAGNOSIS — I610 Nontraumatic intracerebral hemorrhage in hemisphere, subcortical: Secondary | ICD-10-CM | POA: Diagnosis not present

## 2020-06-14 DIAGNOSIS — I10 Essential (primary) hypertension: Secondary | ICD-10-CM

## 2020-06-14 LAB — BASIC METABOLIC PANEL
Anion gap: 9 (ref 5–15)
BUN: 18 mg/dL (ref 6–20)
CO2: 26 mmol/L (ref 22–32)
Calcium: 9 mg/dL (ref 8.9–10.3)
Chloride: 96 mmol/L — ABNORMAL LOW (ref 98–111)
Creatinine, Ser: 1.18 mg/dL (ref 0.61–1.24)
GFR, Estimated: >60 mL/min (ref >60)
Glucose, Bld: 127 mg/dL — ABNORMAL HIGH (ref 70–99)
Potassium: 4 mmol/L (ref 3.5–5.1)
Sodium: 131 mmol/L — ABNORMAL LOW (ref 135–145)

## 2020-06-14 LAB — CBC
HCT: 33.7 % — ABNORMAL LOW (ref 39.0–52.0)
Hemoglobin: 11.3 g/dL — ABNORMAL LOW (ref 13.0–17.0)
MCH: 31.4 pg (ref 26.0–34.0)
MCHC: 33.5 g/dL (ref 30.0–36.0)
MCV: 93.6 fL (ref 80.0–100.0)
Platelets: 239 10*3/uL (ref 150–400)
RBC: 3.6 MIL/uL — ABNORMAL LOW (ref 4.22–5.81)
RDW: 11.8 % (ref 11.5–15.5)
WBC: 7 10*3/uL (ref 4.0–10.5)
nRBC: 0 % (ref 0.0–0.2)

## 2020-06-14 NOTE — PMR Pre-admission (Signed)
PMR Admission Coordinator Pre-Admission Assessment  Patient: [name] is an 55 y.o., male MRN: [id number] DOB: [birth year] Height: 5\' 10"  (177.8 cm) Weight: 106.3 kg              Insurance Information  PRIMARY: [employer]      Policy#:      Subscriber: patient CM Name: [id number]    Phone#: [phone]     Fax#: [phone] Pre-Cert#: [id number] approved for 7 days f/u with Alexa need updates on 6/1  Phone [phone]    Employer:  Benefits:  Phone #: [phone]     Name: 5/26 Eff. Date: 05/23/2020     Deduct: $1000      Out of Pocket Max: $4000      Life Max: none  CIR: 80%      SNF: 80% Outpatient: 80%     Co-Pay: 20% Home Health: 80%      Co-Pay: 20% DME: 80%     Co-Pay: 205 Providers: in-network  SECONDARY:   none   Financial Counselor:       Phone#:   The 07/23/2020" for patients in Inpatient Rehabilitation Facilities with attached "Privacy Act Statement-Health Care Records" was provided and verbally reviewed with: N/A  Emergency Contact Information Contact Information    Name Relation Home Work Mobile   [name],Dalton Spouse [phone]  [phone]     Current Medical History  Patient Admitting Diagnosis: Right thalamic ICH  History of Present Illness:  [name] is a 55 year old male with past medical history of hypertension, obesity with BMI 36.03, cardiomyopathy, hyperlipidemia, diastolic congestive heart failure..  Per chart review lives with spouse.  1 level home with 3 steps to entry.  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  Presented 06/09/2020 with acute onset of left-sided weakness and right gaze.  Noted BP systolic in the 200's-230's.  Cranial CT scan showed acute right thalamic hemorrhage with intraventricular extension 4 mm leftward midline shift.  Echocardiogram with ejection fraction of 65 to 70% no wall motion abnormalities.  Admission chemistries unremarkable except glucose 133, TSH 0.483,  hemoglobin A1c 5.5, urine drug screen negative.  Follow-up MRI showed no substantial change in size extent of acute thalamic hemorrhage with intraventricular extension midline shift unchanged.  No progressive ventriculomegaly.  MRA with no evidence of vascular malformation.  No large vessel occlusion.  Neurology follow-up conservative care monitoring of blood pressure initially maintained on Cleviprex and also initially maintained as well as hypertonic saline.06/11/2020  He was cleared to begin Lovenox for DVT prophylaxis 06/12/2020.  Dysphagia 3/ thin liquid diet.   Episodes of confusion and anxiety treated for alcohol withdrawal with Ativan. ON 5/25 overnight with visual and auditory hallucinations. A repeat CT scan felt stable and improving. Not on scheduled benzodiazepine. Complains headaches which improved with Xanax and Tylenol. Plans to keep low dose Xanax for a few days.Placed on Depakote for intermittent bouts of headaches.   On 5/28 transferred to ICU after a seizure episode with left gaze ad left neck turning with jerking followed by postictal. LTM EEG showed no seizure activity. Depakote level was low at 45, Keppra began. Lethargy now resolved.  Complete NIHSS TOTAL: 10 Glasgow Coma Scale Score: (!) 20  Past Medical History  Past Medical History:  Diagnosis Date  . Chronic combined systolic and diastolic CHF (congestive heart failure) (HCC)    EF 25-30%  . Diastolic dysfunction   . Dyslipidemia   . Hyperlipidemia   . Hypertension   .  Nonischemic dilated cardiomyopathy (HCC)    felt secondary to HTN with no ischemia on nuclear stress test 05/2012, EF 25-30%  . Shortness of breath     Family History  family history includes CAD in his mother; Heart attack in his maternal grandmother and mother; Hypertension in his father and mother.  Prior Rehab/Hospitalizations:  Has the patient had prior rehab or hospitalizations prior to admission? yes  Has the patient had major surgery during 100 days  prior to admission? No  Current Medications   Current Facility-Administered Medications:  .   stroke: mapping our early stages of recovery book, , Does not apply, Once, Milon Dikes, MD .  acetaminophen (TYLENOL) tablet 1,000 mg, 1,000 mg, Oral, Q6H PRN, Dorcas Carrow, MD, 1,000 mg at 06/22/20 0912 .  amLODipine (NORVASC) tablet 10 mg, 10 mg, Oral, Daily, Marvel Plan, MD, 10 mg at 06/21/20 0914 .  Chlorhexidine Gluconate Cloth 2 % PADS 6 each, 6 each, Topical, Daily, Milon Dikes, MD, 6 each at 06/21/20 [phone] .  cloNIDine (CATAPRES) tablet 0.1 mg, 0.1 mg, Oral, TID, Marvel Plan, MD, 0.1 mg at 06/21/20 2159 .  enoxaparin (LOVENOX) injection 40 mg, 40 mg, Subcutaneous, Q24H, Micki Riley, MD, 40 mg at 06/21/20 1217 .  folic acid (FOLVITE) tablet 1 mg, 1 mg, Oral, Daily, Marvel Plan, MD, 1 mg at 06/21/20 0914 .  hydrALAZINE (APRESOLINE) injection 20 mg, 20 mg, Intravenous, Q4H PRN, Bailey-Modzik, Delila A, NP, 20 mg at 06/20/20 0515 .  labetalol (NORMODYNE) injection 40 mg, 40 mg, Intravenous, Q2H PRN, Micki Riley, MD, 40 mg at 06/20/20 1641 .  labetalol (NORMODYNE) tablet 100 mg, 100 mg, Oral, BID, Dorcas Carrow, MD, 100 mg at 06/21/20 2200 .  levETIRAcetam (KEPPRA) tablet 500 mg, 500 mg, Oral, BID, Marvel Plan, MD, 500 mg at 06/21/20 2200 .  losartan (COZAAR) tablet 50 mg, 50 mg, Oral, BID, Dorcas Carrow, MD, 50 mg at 06/21/20 2200 .  MEDLINE mouth rinse, 15 mL, Mouth Rinse, BID, Milon Dikes, MD, 15 mL at 06/21/20 2202 .  multivitamin with minerals tablet 1 tablet, 1 tablet, Oral, Daily, Marvel Plan, MD, 1 tablet at 06/21/20 0914 .  ondansetron (ZOFRAN) injection 4 mg, 4 mg, Intravenous, Q6H PRN, Dorcas Carrow, MD, 4 mg at 06/21/20 1617 .  pantoprazole (PROTONIX) EC tablet 40 mg, 40 mg, Oral, Daily, Marvel Plan, MD, 40 mg at 06/21/20 0914 .  polyvinyl alcohol (LIQUIFILM TEARS) 1.4 % ophthalmic solution 1 drop, 1 drop, Both Eyes, Q6H PRN, Marvel Plan, MD .  QUEtiapine (SEROQUEL)  tablet 25 mg, 25 mg, Oral, QHS, Marvel Plan, MD, 25 mg at 06/21/20 1831 .  senna-docusate (Senokot-S) tablet 1 tablet, 1 tablet, Oral, BID, Milon Dikes, MD, 1 tablet at 06/21/20 2200 .  thiamine (B-1) injection 100 mg, 100 mg, Intravenous, Daily, 100 mg at 06/19/20 0914 **OR** thiamine tablet 100 mg, 100 mg, Oral, Daily, Kirby-Graham, Beather Arbour, NP, 100 mg at 06/21/20 1607  Patients Current Diet:  Diet Order            DIET DYS 3 Room service appropriate? Yes; Fluid consistency: Thin  Diet effective now                 Precautions / Restrictions Precautions Precautions: Fall,Other (comment) Precaution Comments: Lt hemi with right gaze preference Restrictions Weight Bearing Restrictions: No   Has the patient had 2 or more falls or a fall with injury in the past year?No  Prior Activity Level Community (5-7x/wk): drives, works; gets  out of house daily works as a Technical sales engineer Level Prior Function Level of Independence: Independent Comments: Pt drives a forklift.  Self Care: Did the patient need help bathing, dressing, using the toilet or eating?  Independent  Indoor Mobility: Did the patient need assistance with walking from room to room (with or without device)? Independent  Stairs: Did the patient need assistance with internal or external stairs (with or without device)? Independent  Functional Cognition: Did the patient need help planning regular tasks such as shopping or remembering to take medications? Independent  Home Assistive Devices / Equipment Home Assistive Devices/Equipment: None Home Equipment: None  Prior Device Use: Indicate devices/aids used by the patient prior to current illness, exacerbation or injury? cane  Current Functional Level Cognition  Arousal/Alertness: Awake/alert Overall Cognitive Status: Impaired/Different from baseline Current Attention Level: Sustained Orientation Level: Oriented to person,Oriented to  place,Oriented to time,Disoriented to situation Following Commands: Follows one step commands with increased time,Follows multi-step commands inconsistently Safety/Judgement: Decreased awareness of safety,Decreased awareness of deficits General Comments: pt awake with STM deficits needing redirection and repetition of tasks. pt with poor awareness of balance and strength deficits Attention: Sustained Sustained Attention: Impaired Sustained Attention Impairment: Verbal basic Memory: Impaired Memory Impairment: Retrieval deficit Awareness: Impaired Awareness Impairment: Emergent impairment,Anticipatory impairment Problem Solving: Impaired Problem Solving Impairment: Verbal basic Executive Function: Sequencing,Organizing Behaviors: Impulsive Safety/Judgment: Impaired    Extremity Assessment (includes Sensation/Coordination)  Upper Extremity Assessment: LUE deficits/detail LUE Deficits / Details: Decreased acitve movement. Poor attention to LUE and left side LUE Sensation: decreased light touch,decreased proprioception LUE Coordination: decreased fine motor,decreased gross motor  Lower Extremity Assessment: Defer to PT evaluation LLE Deficits / Details: adducting LLE LLE Sensation: decreased light touch,decreased proprioception LLE Coordination: decreased fine motor,decreased gross motor    ADLs  Overall ADL's : Needs assistance/impaired Eating/Feeding: Set up,Sitting (will further assess; unsure if pt attending to full tray) Grooming: Minimal assistance,Sitting,Maximal assistance Grooming Details (indicate cue type and reason): Pt requiring assistance to maintain grasp on tooth paste with L hand. Also poor sitting balance when performing oral care and needing Max A for support at EOB Upper Body Bathing: Moderate assistance,Bed level Lower Body Bathing: Maximal assistance,Bed level Upper Body Dressing : Moderate assistance,Bed level Lower Body Dressing: Total assistance Toilet  Transfer: Minimal assistance,+2 for physical assistance,Total assistance (sit<>stand wit stedy and then transfer to recliner) Toilet Transfer Details (indicate cue type and reason): Min A +2 for sit<>stand with stedy adn then transfer to recliner Toileting- Clothing Manipulation and Hygiene: Total assistance Functional mobility during ADLs: +2 for physical assistance,Minimal assistance (sit <> stand) General ADL Comments: Highly motivated. Decreased strength, balance, attention to L, and cognition    Mobility  Overal bed mobility: Needs Assistance Bed Mobility: Supine to Sit Rolling: Min assist,Min guard Sidelying to sit: Mod assist Supine to sit: Mod assist,+2 for physical assistance Sit to supine: Total assist,+2 for physical assistance,+2 for safety/equipment General bed mobility comments: physical assist with cues to hook RLE under LLE to pivot legs to EOB and mod +2 assist to lift trunk from surface toward right side of bed with mod cues to push into bed with right elbow    Transfers  Overall transfer level: Needs assistance Equipment used: Ambulation equipment used Transfer via Lift Equipment: Stedy Transfers: Sit to/from Stand Sit to Stand: [employer] physical assistance Squat pivot transfers: +2 physical assistance,Total assist General transfer comment: with left knee blocked and bil UE hooked with PT/tech pt able to stand EOB  and achieve fully upright with cues and assist for weight shifting to midine grossly 30 sec. Pt then stood with [facility] for pivot to chair    Ambulation / Gait / Stairs / Wheelchair Mobility  Ambulation/Gait General Gait Details: not yet able    Posture / Balance Dynamic Sitting Balance Sitting balance - Comments: pt with tendency for anterior left lean with cues for midline and upright posture. Pt able to correct at times but can only maintain for a couple seconds Balance Overall balance assessment: Needs assistance Sitting-balance support: Feet  supported,Single extremity supported Sitting balance-Leahy Scale: Poor Sitting balance - Comments: pt with tendency for anterior left lean with cues for midline and upright posture. Pt able to correct at times but can only maintain for a couple seconds Postural control: Left lateral lean Standing balance support: Bilateral upper extremity supported Standing balance-Leahy Scale: Poor Standing balance comment: left lateral bias    Special needs/care consideration CIWA for alcohol withdrawal   Previous Home Environment  Living Arrangements: Spouse/significant other  Lives With: Spouse Available Help at Discharge: Family,Available 24 hours/day Type of Home: House Home Layout: One level Home Access: Stairs to enter Entrance Stairs-Rails: Left Entrance Stairs-Number of Steps: 1 Bathroom Shower/Tub: Armed forces operational officer Accessibility: Yes How Accessible: Accessible via walker Home Care Services: No  Discharge Living Setting Plans for Discharge Living Setting: Patient's home Type of Home at Discharge: House Discharge Home Layout: One level Discharge Home Access: Stairs to enter Entrance Stairs-Rails: Left Entrance Stairs-Number of Steps: 1 Discharge Bathroom Shower/Tub: Tub/shower unit Discharge Bathroom Toilet: Standard Discharge Bathroom Accessibility: Yes How Accessible: Accessible via walker Does the patient have any problems obtaining your medications?: No  Social/Family/Support Systems Anticipated Caregiver: Albertina Parr, wife Anticipated Caregiver's Contact Information: [phone] Caregiver Availability: 24/7 (wife works but plans on having someone help pt when she is at work) Discharge Plan Discussed with Primary Caregiver: Yes Is Caregiver In Agreement with Plan?: Yes Does Caregiver/Family have Issues with Lodging/Transportation while Pt is in Rehab?: No  Goals Patient/Family Goal for Rehab: Mod A:PT/OT, Supervision: ST Expected  length of stay: 21-23 days Pt/Family Agrees to Admission and willing to participate: Yes Program Orientation Provided & Reviewed with Pt/Caregiver Including Roles  & Responsibilities: Yes  Decrease burden of Care through IP rehab admission: NA  Possible need for SNF placement upon discharge:Not anticipated  Patient Condition: This patient's medical and functional status has changed since the consult dated: 06/14/2020 in which the Rehabilitation Physician determined and documented that the patient's condition is appropriate for intensive rehabilitative care in an inpatient rehabilitation facility. See "History of Present Illness" (above) for medical update. Functional changes are: mod assist overall. Patient's medical and functional status update has been discussed with the Rehabilitation physician and patient remains appropriate for inpatient rehabilitation. Will admit to inpatient rehab 06/22/2020  Preadmission Screen Completed By:  Clois Dupes, RN, 06/22/2020 10:07 AM ______________________________________________________________________   Discussed status with Dr. Riley Kill on  06/22/2020 at 1008 and received approval for admission **  Admission Coordinator:  Clois Dupes, time [id number] Date 06/22/2020

## 2020-06-14 NOTE — Progress Notes (Incomplete)
STROKE TEAM PROGRESS NOTE   SUBJECTIVE (INTERVAL HISTORY) No acute events.  Vital signs stable.  Neurological exam unchanged.   OBJECTIVE Temp:  [97.8 F (36.6 C)-99.2 F (37.3 C)] 98.3 F (36.8 C) (05/23 1452) Pulse Rate:  [66-81] 72 (05/23 1452) Cardiac Rhythm: Normal sinus rhythm (05/22 2000) Resp:  [16-30] 22 (05/23 1452) BP: (130-166)/(69-91) 135/75 (05/23 1452) SpO2:  [91 %-99 %] 97 % (05/23 1452)  Recent Labs  Lab 06/09/20 1607  GLUCAP 123*   Recent Labs  Lab 06/10/20 0724 06/10/20 1658 06/11/20 0433 06/11/20 1030 06/11/20 2233 06/12/20 0420 06/12/20 0953 06/13/20 0412 06/14/20 0516  NA 144   < > 142   < > 142 144 141 134* 131*  K 4.3  --  4.1  --   --  3.3*  --  3.6 4.0  CL 114*  --  113*  --   --  108  --  102 96*  CO2 24  --  23  --   --  27  --  26 26  GLUCOSE 163*  --  146*  --   --  111*  --  119* 127*  BUN 16  --  7  --   --  6  --  10 18  CREATININE 1.68*  --  1.19  --   --  1.21  --  1.13 1.18  CALCIUM 8.5*  --  8.5*  --   --  8.6*  --  8.7* 9.0   < > = values in this interval not displayed.   Recent Labs  Lab 06/09/20 1602  AST 40  ALT 38  ALKPHOS 66  BILITOT 0.9  PROT 7.6  ALBUMIN 4.2   Recent Labs  Lab 06/09/20 1602 06/09/20 1608 06/10/20 0724 06/11/20 0433 06/12/20 0420 06/13/20 0412 06/14/20 0516  WBC 9.3  --  8.4 10.9* 6.9 7.5 7.0  NEUTROABS 7.8*  --   --   --   --   --   --   HGB 13.8   < > 12.4* 12.2* 11.1* 11.2* 11.3*  HCT 41.1   < > 37.4* 36.6* 34.8* 33.9* 33.7*  MCV 93.4  --  96.6 97.1 97.2 94.4 93.6  PLT 260  --  270 278 216 235 239   < > = values in this interval not displayed.   No results for input(s): CKTOTAL, CKMB, CKMBINDEX, TROPONINI in the last 168 hours. No results for input(s): LABPROT, INR in the last 72 hours. No results for input(s): COLORURINE, LABSPEC, PHURINE, GLUCOSEU, HGBUR, BILIRUBINUR, KETONESUR, PROTEINUR, UROBILINOGEN, NITRITE, LEUKOCYTESUR in the last 72 hours.  Invalid input(s): APPERANCEUR      Component Value Date/Time   CHOL 196 06/09/2020 1809   TRIG 153 (H) 06/12/2020 0420   HDL 52 06/09/2020 1809   CHOLHDL 3.8 06/09/2020 1809   VLDL 66 (H) 06/09/2020 1809   LDLCALC 78 06/09/2020 1809   Lab Results  Component Value Date   HGBA1C 5.5 06/09/2020      Component Value Date/Time   LABOPIA NONE DETECTED 06/09/2020 2256   COCAINSCRNUR NONE DETECTED 06/09/2020 2256   LABBENZ NONE DETECTED 06/09/2020 2256   AMPHETMU NONE DETECTED 06/09/2020 2256   THCU NONE DETECTED 06/09/2020 2256   LABBARB NONE DETECTED 06/09/2020 2256    No results for input(s): ETH in the last 168 hours.  I have personally reviewed the radiological images below and agree with the radiology interpretations.  CT HEAD WO CONTRAST  Result Date: 06/11/2020 CLINICAL DATA:  Left arm weakness. Intracranial hemorrhage follow-up EXAM: CT HEAD WITHOUT CONTRAST TECHNIQUE: Contiguous axial images were obtained from the base of the skull through the vertex without intravenous contrast. COMPARISON:  Head CT 06/10/2020 FINDINGS: Brain: Unchanged intraparenchymal hemorrhage centered in the right thalamus with intraventricular extension. Size and configuration of the ventricles is unchanged. Mild leftward midline shift is also unchanged. No new site of hemorrhage. Vascular: No abnormal hyperdensity of the major intracranial arteries or dural venous sinuses. No intracranial atherosclerosis. Skull: The visualized skull base, calvarium and extracranial soft tissues are normal. Sinuses/Orbits: No fluid levels or advanced mucosal thickening of the visualized paranasal sinuses. No mastoid or middle ear effusion. The orbits are normal. IMPRESSION: 1. Unchanged size and configuration of right thalamic intraparenchymal hemorrhage with intraventricular extension. 2. Unchanged mild leftward midline shift. Electronically Signed   By: Deatra Robinson M.D.   On: 06/11/2020 18:49   CT HEAD WO CONTRAST  Result Date: 06/09/2020 CLINICAL DATA:   Intracranial hemorrhage follow EXAM: CT HEAD WITHOUT CONTRAST TECHNIQUE: Contiguous axial images were obtained from the base of the skull through the vertex without intravenous contrast. COMPARISON:  06/09/2020 at 4:10 p.m. FINDINGS: Brain: Unchanged appearance of intracranial hemorrhage in the right thalamus with extension into the ventricles. 4 mm leftward midline shift is also unchanged. Vascular: No abnormal hyperdensity of the major intracranial arteries or dural venous sinuses. No intracranial atherosclerosis. Skull: The visualized skull base, calvarium and extracranial soft tissues are normal. Sinuses/Orbits: No fluid levels or advanced mucosal thickening of the visualized paranasal sinuses. No mastoid or middle ear effusion. The orbits are normal. IMPRESSION: 1. Unchanged appearance of right thalamic intracranial hemorrhage with extension into the ventricles. 2. Unchanged 4 mm leftward midline shift. Electronically Signed   By: Deatra Robinson M.D.   On: 06/09/2020 22:18   CT SOFT TISSUE NECK WO CONTRAST  Result Date: 06/09/2020 CLINICAL DATA:  Foreign body in the neck. EXAM: CT NECK WITHOUT CONTRAST TECHNIQUE: Multidetector CT imaging of the neck was performed following the standard protocol without intravenous contrast. COMPARISON:  None. FINDINGS: PHARYNX AND LARYNX: The nasopharynx, oropharynx and larynx are normal. Visible portions of the oral cavity, tongue base and floor of mouth are normal. Normal epiglottis, vallecula and pyriform sinuses. The larynx is normal. No retropharyngeal abscess, effusion or lymphadenopathy. SALIVARY GLANDS: Normal parotid, submandibular and sublingual glands. THYROID: Normal. LYMPH NODES: No enlarged or abnormal density lymph nodes. VASCULAR: Major cervical vessels are patent. Left IJ central venous catheter. LIMITED INTRACRANIAL: Normal. VISUALIZED ORBITS: Normal. MASTOIDS AND VISUALIZED PARANASAL SINUSES: No fluid levels or advanced mucosal thickening. No mastoid  effusion. SKELETON: No bony spinal canal stenosis. No lytic or blastic lesions. UPPER CHEST: Clear. OTHER: None. IMPRESSION: Normal CT of the neck. Electronically Signed   By: Deatra Robinson M.D.   On: 06/09/2020 22:22   CT CHEST WO CONTRAST  Result Date: 06/09/2020 CLINICAL DATA:  Mediastinal widening on chest radiograph, possible foreign body EXAM: CT CHEST WITHOUT CONTRAST TECHNIQUE: Multidetector CT imaging of the chest was performed following the standard protocol without IV contrast. COMPARISON:  Radiograph 06/09/2020, 01/02/2018, contemporary neck radiograph FINDINGS: Cardiovascular: Left IJ approach central venous catheter tip terminates near the level of the left brachiocephalic-superior caval junction. Cardiac size is top normal no sizable pericardial effusion. Atherosclerotic plaque within the normal caliber aorta. No hyperdense mural thickening to suggest an intra mural hematoma. No periaortic stranding or hemorrhage is evident on this unenhanced exam. Central pulmonary arteries are normal caliber. Luminal evaluation of the vasculature is precluded  in the absence of contrast media. Mediastinum/Nodes: Some pronounced upper mediastinal widening seen on the chest radiograph is likely a combination of portable technique and abundant mediastinal fat noted on this exam. No mediastinal fluid or gas. Normal thyroid gland and thoracic inlet. No acute abnormality of the trachea or esophagus. No worrisome mediastinal or axillary adenopathy. Hilar nodal evaluation is limited in the absence of intravenous contrast media. Lungs/Pleura: Low lung volumes and atelectasis with more bandlike subsegmental atelectasis towards the lung bases. No effusion. No pneumothorax. Upper Abdomen: No acute abnormalities present in the visualized portions of the upper abdomen. Musculoskeletal: No acute osseous abnormality or suspicious osseous lesion. Degenerative changes are present in the imaged spine and shoulders. No worrisome  chest wall mass. No unexpected radiodense foreign bodies. IMPRESSION: 1. No expected radiopaque foreign body seen in the chest. 2. Left IJ approach central venous catheter tip terminates at the left brachiocephalic-superior caval junction. 3. Mediastinal widening present on comparison chest radiograph is likely a combination artifactual accentuation due to portable technique and abundant mediastinal fat noted on this exam. No worrisome aortic dilatation, or other acute aortic abnormality within the limitations of an unenhanced CT. 4. Basilar atelectatic changes. Electronically Signed   By: Kreg Shropshire M.D.   On: 06/09/2020 23:26   MR ANGIO HEAD WO CONTRAST  Result Date: 06/10/2020 CLINICAL DATA:  Cerebral hemorrhage. EXAM: MRA HEAD WITHOUT CONTRAST TECHNIQUE: Angiographic images of the Circle of Willis were acquired using MRA technique without intravenous contrast. COMPARISON:  No pertinent prior exam. FINDINGS: Motion limited exam.  Within this limitation: Anterior circulation: No large vessel occlusion or evidence of proximal hemodynamically significant stenosis. No evidence of hemodynamically significant proximal stenosis, although evaluation limited by motion. No aneurysm identified. No evidence of vascular malformation in the region of the right thalamic hemorrhage, although acute blood products limit evaluation. There is intrinsic T1 hyperintensity within the hemorrhage, which also limits evaluation. Posterior circulation: No large vessel occlusion or evidence of proximal hemodynamically significant stenosis. Bilateral posterior communicating arteries. No aneurysm. IMPRESSION: 1. No evidence of a vascular malformation in the region of the right thalamic hemorrhage, although acute blood products limit evaluation. 2. No large vessel occlusion or evidence of proximal hemodynamically significant stenosis on this motion limited exam. Electronically Signed   By: Feliberto Harts MD   On: 06/10/2020 12:02    MR BRAIN W WO CONTRAST  Result Date: 06/10/2020 CLINICAL DATA:  Hemorrhage. EXAM: MRI HEAD WITHOUT AND WITH CONTRAST TECHNIQUE: Multiplanar, multiecho pulse sequences of the brain and surrounding structures were obtained without and with intravenous contrast. CONTRAST:  10mL GADAVIST GADOBUTROL 1 MMOL/ML IV SOLN COMPARISON:  CT Jun 09, 2020. FINDINGS: Mildly motion limited exam. Brain: When comparing across modalities, no substantial change in size/extent of an acute right thalamic hemorrhage, measuring up to approximately 3.8 by 2.4 cm. The hemorrhage demonstrates areas of intrinsic T1 hyperintensity. Similar extraventricular extension with blood products in right greater than left lateral ventricles, third ventricle, and fourth ventricle. No progressive ventriculomegaly. Surrounding edema with similar 4 mm of leftward midline shift. Outside of the hemorrhage itself, and no confluent restricted diffusion to suggest acute infarct. No visible mass lesion, although acute blood products limit evaluation. Moderate patchy T2/FLAIR hyperintensities within the white matter and pons, nonspecific but most likely related to chronic microvascular ischemic disease. Vascular: Further evaluated on concurrent MRA. Skull and upper cervical spine: Normal marrow signal. Sinuses/Orbits: Clear sinuses.  No acute orbital finding. Other: No mastoid effusions. IMPRESSION: 1. When comparing across modalities,  no substantial change in size/extent of an acute right thalamic hemorrhage with intraventricular extension, as detailed above. Similar 4 mm of leftward midline shift. No progressive ventriculomegaly. 2. No specific evidence of acute infarct or visible mass lesion, although acute blood products limits evaluation. A follow-up MRI after resolution of hemorrhage could further evaluate if clinically indicated. 3. Moderate chronic microvascular ischemic disease. Electronically Signed   By: Feliberto Harts MD   On: 06/10/2020 11:51    DG Chest Portable 1 View  Result Date: 06/09/2020 CLINICAL DATA:  55 year old male with history of central line placement. EXAM: PORTABLE CHEST 1 VIEW COMPARISON:  Chest x-ray 01/02/2018. FINDINGS: Left-sided internal jugular central venous catheter with tip terminating near the junction of the innominate vein with the superior vena cava. Lung volumes are low. No consolidative airspace disease. No pleural effusions. No pneumothorax. No evidence of pulmonary edema. Heart size is mildly enlarged. Marked widening of the mediastinum, new compared to the prior examination. IMPRESSION: 1. Left internal jugular central venous catheter in position, as above, with profound widening of the mediastinum. If there is clinical concern for aortic dissection and/or mediastinal hematoma, further evaluation with chest CTA would be recommended. Electronically Signed   By: Trudie Reed M.D.   On: 06/09/2020 18:11   ECHOCARDIOGRAM COMPLETE  Result Date: 06/10/2020    ECHOCARDIOGRAM REPORT   Patient Name:   Bluford Sedler [name] Date of Exam: 06/10/2020 Medical Rec #:  [id number]      Height:       70.0 in Accession #:    [id number]     Weight:       251.1 lb Date of Birth:  12-24-1965      BSA:          2.299 m Patient Age:    54 years       BP:           140/71 mmHg Patient Gender: M              HR:           102 bpm. Exam Location:  Inpatient Procedure: 2D Echo, Color Doppler and Cardiac Doppler Indications:    CVA  History:        Patient has prior history of Echocardiogram examinations, most                 recent 08/12/2014. Cardiomyopathy and CHF,                 Signs/Symptoms:Shortness of Breath; Risk Factors:Hypertension,                 Dyslipidemia and Diabetes.  Sonographer:    Neomia Dear RDCS Referring Phys: [id number] ASHISH ARORA  Sonographer Comments: Patient is morbidly obese and Technically difficult study due to poor echo windows. IMPRESSIONS  1. Left ventricular ejection fraction, by estimation, is 65 to 70%. The  left ventricle has normal function. The left ventricle has no regional wall motion abnormalities. There is severe left ventricular hypertrophy. Indeterminate diastolic filling due to E-A fusion.  2. Right ventricular systolic function is normal. The right ventricular size is normal. There is normal pulmonary artery systolic pressure. The estimated right ventricular systolic pressure is 28.4 mmHg.  3. The mitral valve is normal in structure. Trivial mitral valve regurgitation. No evidence of mitral stenosis.  4. The aortic valve is normal in structure. Aortic valve regurgitation is trivial. No aortic stenosis is present.  5. The inferior vena cava is normal in  size with greater than 50% respiratory variability, suggesting right atrial pressure of 3 mmHg.  6. Increased flow velocities may be secondary to anemia, thyrotoxicosis, hyperdynamic or high flow state. Conclusion(s)/Recommendation(s): No intracardiac source of embolism detected on this transthoracic study. A transesophageal echocardiogram is recommended to exclude cardiac source of embolism if clinically indicated. FINDINGS  Left Ventricle: Left ventricular ejection fraction, by estimation, is 65 to 70%. The left ventricle has normal function. The left ventricle has no regional wall motion abnormalities. The left ventricular internal cavity size was normal in size. There is  severe left ventricular hypertrophy. Indeterminate diastolic filling due to E-A fusion. Right Ventricle: The right ventricular size is normal. No increase in right ventricular wall thickness. Right ventricular systolic function is normal. There is normal pulmonary artery systolic pressure. The tricuspid regurgitant velocity is 2.52 m/s, and  with an assumed right atrial pressure of 3 mmHg, the estimated right ventricular systolic pressure is 28.4 mmHg. Left Atrium: Left atrial size was normal in size. Right Atrium: Right atrial size was normal in size. Pericardium: There is no evidence of  pericardial effusion. Mitral Valve: The mitral valve is normal in structure. Trivial mitral valve regurgitation. No evidence of mitral valve stenosis. MV peak gradient, 13.0 mmHg. The mean mitral valve gradient is 4.0 mmHg. Tricuspid Valve: The tricuspid valve is normal in structure. Tricuspid valve regurgitation is trivial. No evidence of tricuspid stenosis. Aortic Valve: The aortic valve is normal in structure. Aortic valve regurgitation is trivial. No aortic stenosis is present. Aortic valve mean gradient measures 7.0 mmHg. Aortic valve peak gradient measures 13.8 mmHg. Aortic valve area, by VTI measures 3.77 cm. Pulmonic Valve: The pulmonic valve was not well visualized. Pulmonic valve regurgitation is trivial. No evidence of pulmonic stenosis. Aorta: The aortic root is normal in size and structure. Venous: The inferior vena cava is normal in size with greater than 50% respiratory variability, suggesting right atrial pressure of 3 mmHg. IAS/Shunts: No atrial level shunt detected by color flow Doppler.  LEFT VENTRICLE PLAX 2D LVIDd:         4.00 cm     Diastology LVIDs:         2.00 cm     LV e' medial:    4.57 cm/s LV PW:         1.70 cm     LV E/e' medial:  16.3 LV IVS:        1.60 cm     LV e' lateral:   7.83 cm/s LVOT diam:     2.30 cm     LV E/e' lateral: 9.5 LV SV:         106 LV SV Index:   46 LVOT Area:     4.15 cm  LV Volumes (MOD) LV vol d, MOD A2C: 78.9 ml LV vol d, MOD A4C: 93.0 ml LV vol s, MOD A2C: 23.9 ml LV vol s, MOD A4C: 22.9 ml LV SV MOD A2C:     55.0 ml LV SV MOD A4C:     93.0 ml LV SV MOD BP:      63.6 ml RIGHT VENTRICLE RV Basal diam:  3.70 cm RV Mid diam:    2.90 cm RV S prime:     25.20 cm/s TAPSE (M-mode): 3.7 cm LEFT ATRIUM             Index       RIGHT ATRIUM           Index LA diam:  3.20 cm 1.39 cm/m  RA Area:     13.30 cm LA Vol (A2C):   65.6 ml 28.53 ml/m RA Volume:   31.10 ml  13.53 ml/m LA Vol (A4C):   59.8 ml 26.01 ml/m LA Biplane Vol: 63.6 ml 27.66 ml/m  AORTIC  VALVE                    PULMONIC VALVE AV Area (Vmax):    3.19 cm     PV Vmax:       1.64 m/s AV Area (Vmean):   3.23 cm     PV Vmean:      113.000 cm/s AV Area (VTI):     3.77 cm     PV VTI:        0.256 m AV Vmax:           186.00 cm/s  PV Peak grad:  10.8 mmHg AV Vmean:          124.000 cm/s PV Mean grad:  6.0 mmHg AV VTI:            0.282 m AV Peak Grad:      13.8 mmHg AV Mean Grad:      7.0 mmHg LVOT Vmax:         143.00 cm/s LVOT Vmean:        96.300 cm/s LVOT VTI:          0.256 m LVOT/AV VTI ratio: 0.91  AORTA Ao Root diam: 3.20 cm Ao Asc diam:  3.20 cm Ao Arch diam: 3.2 cm MITRAL VALVE                TRICUSPID VALVE MV Area (PHT): 4.63 cm     TR Peak grad:   25.4 mmHg MV Area VTI:   3.39 cm     TR Vmax:        252.00 cm/s MV Peak grad:  13.0 mmHg MV Mean grad:  4.0 mmHg     SHUNTS MV Vmax:       1.80 m/s     Systemic VTI:  0.26 m MV Vmean:      94.4 cm/s    Systemic Diam: 2.30 cm MV Decel Time: 164 msec MV E velocity: 74.30 cm/s MV A velocity: 106.00 cm/s MV E/A ratio:  0.70 Weston Brass MD Electronically signed by Weston Brass MD Signature Date/Time: 06/10/2020/11:44:08 AM    Final    CT HEAD CODE STROKE WO CONTRAST  Result Date: 06/09/2020 CLINICAL DATA:  Code stroke.  Rightward gaze.  Left arm weakness. EXAM: CT HEAD WITHOUT CONTRAST TECHNIQUE: Contiguous axial images were obtained from the base of the skull through the vertex without intravenous contrast. COMPARISON:  None. FINDINGS: Brain: An acute hemorrhage centered in the right thalamus measures 3.6 x 2.8 x 2.7 cm (approximate volume of 14 mL). There is intraventricular extension with a small amount of hemorrhage in the right greater than left lateral ventricles, third ventricle, and fourth ventricle. The ventricles are not grossly dilated, however prior imaging is not available to no the patient's baseline ventricle size. There is mild edema surrounding the right thalamic hemorrhage. There is 4 mm of leftward midline shift at the  level of the thalami. Hypodensities in the cerebral white matter bilaterally are nonspecific but likely reflect mild chronic small vessel ischemic disease given the patient's vascular risk factors. No acute cortically based infarct or extra-axial fluid collection is evident. Vascular: No hyperdense vessel. Skull: No fracture or suspicious  osseous lesion. Sinuses/Orbits: Minimal mucosal thickening in the right sphenoid sinus. Clear mastoid air cells. Left cataract extraction. Rightward gaze. Other: None. ASPECTS [facility] Stroke Program Early CT Score) Not scored in the presence of acute hemorrhage. IMPRESSION: 1. 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 2. 4 mm of leftward midline shift. 3. Mild chronic small vessel ischemic disease. These results were communicated to Dr. Wilford Corner at 4:17 pm on 06/09/2020 by text page via the [facility] messaging system. Electronically Signed   By: Sebastian Ache M.D.   On: 06/09/2020 16:21   VAS US CAROTID  Result Date: 06/12/2020 Carotid Arterial Duplex Study Patient Name:  Kebin Maye [name]  Date of Exam:   06/10/2020 Medical Rec #: [id number]       Accession #:    [id number] Date of Birth: 01/02/1966       Patient Gender: M Patient Age:   54Y Exam Location:  [facility] Procedure:      VAS US CAROTID Referring Phys: [id number] Marvel Plan --------------------------------------------------------------------------------  Indications:       CVA. Risk Factors:      Hypertension, hyperlipidemia. Limitations        Today's exam was limited due to the body habitus of the                    patient, the patient's respiratory variation, patient                    movement, patient somnolence and a central line. Comparison Study:  No prior studies. Performing Technologist: Chanda Busing RVT  Examination Guidelines: A complete evaluation includes B-mode imaging, spectral Doppler, color Doppler, and power Doppler as needed of all accessible portions of each vessel.  Bilateral testing is considered an integral part of a complete examination. Limited examinations for reoccurring indications may be performed as noted.  Right Carotid Findings: +----------+--------+--------+--------+-----------------------+--------+           PSV cm/sEDV cm/sStenosisPlaque Description     Comments +----------+--------+--------+--------+-----------------------+--------+ CCA Prox  101     12                                              +----------+--------+--------+--------+-----------------------+--------+ CCA Distal82      11              smooth and heterogenous         +----------+--------+--------+--------+-----------------------+--------+ ICA Prox  58      18              smooth and heterogenous         +----------+--------+--------+--------+-----------------------+--------+ ICA Distal57      17                                     tortuous +----------+--------+--------+--------+-----------------------+--------+ ECA       136     19                                              +----------+--------+--------+--------+-----------------------+--------+ +----------+--------+-------+--------+-------------------+           PSV cm/sEDV cmsDescribeArm Pressure (mmHG) +----------+--------+-------+--------+-------------------+ Subclavian120                                        +----------+--------+-------+--------+-------------------+ +---------+--------+--+--------+--+---------+  VertebralPSV cm/s56EDV cm/s13Antegrade +---------+--------+--+--------+--+---------+  Left Carotid Findings: +----------+--------+--------+--------+-----------------------+--------+           PSV cm/sEDV cm/sStenosisPlaque Description     Comments +----------+--------+--------+--------+-----------------------+--------+ CCA Prox  58      8               smooth and heterogenous         +----------+--------+--------+--------+-----------------------+--------+  CCA Distal69      11              smooth and heterogenous         +----------+--------+--------+--------+-----------------------+--------+ ICA Prox  69      20              smooth and heterogenoustortuous +----------+--------+--------+--------+-----------------------+--------+ ICA Distal112     34                                     tortuous +----------+--------+--------+--------+-----------------------+--------+ ECA       99      14                                              +----------+--------+--------+--------+-----------------------+--------+ +----------+--------+--------+--------+-------------------+           PSV cm/sEDV cm/sDescribeArm Pressure (mmHG) +----------+--------+--------+--------+-------------------+ [id number]                                         +----------+--------+--------+--------+-------------------+ +---------+--------+--+--------+--+---------+ VertebralPSV cm/s41EDV cm/s10Antegrade +---------+--------+--+--------+--+---------+   Summary: Right Carotid: Velocities in the right ICA are consistent with a 1-39% stenosis. Left Carotid: Velocities in the left ICA are consistent with a 1-39% stenosis. Vertebrals: Bilateral vertebral arteries demonstrate antegrade flow. *See table(s) above for measurements and observations.  Electronically signed by Delia Heady MD on 06/12/2020 at 2:03:49 PM.    Final    [place] EKG SITE RITE  Result Date: 06/09/2020 If Site Rite image not attached, placement could not be confirmed due to current cardiac rhythm.  PHYSICAL EXAM  Temp:  [97.8 F (36.6 C)-99.2 F (37.3 C)] 98.3 F (36.8 C) (05/23 1452) Pulse Rate:  [66-81] 72 (05/23 1452) Resp:  [16-30] 22 (05/23 1452) BP: (130-166)/(69-91) 135/75 (05/23 1452) SpO2:  [91 %-99 %] 97 % (05/23 1452)  General -obese middle-aged African-American male.  In mild respiratory distress. Ophthalmologic - fundi not visualized due to  noncooperation.  Cardiovascular - Regular rhythm and rate.  Neuro -awake alert and oriented to place and people, but not to age and time. No aphasia, fluent language, following all simple commands, mild   dysarthria. Able to name and repeat. No gaze palsy, tracking bilaterally, visual field full, PERRL, however with right gaze nystagmus. Left facial droop. Tongue midline. RUE and RLE 5/5, LUE 2/5 and LLE 2-3/5, left sensation loss on the face and LUE and LLE, right FTN intact, gait not tested.   ASSESSMENT/PLAN [name] is a 55 y.o. male with history of hypertension, obesity, cardiomyopathy, hyperlipidemia, CHF admitted for right-sided gaze, right-sided weakness numbness, fall. No tPA given due to ICH.    ICH:  right thalamic ICH with IVH, likely due to uncontrolled hypertension  CT head showed right thalamic ICH with IVH with 4 mm midline shift  Repeat CT head no hematoma  expansion  MRI stable right thalamic ICH with IVH and midline shift  MRA no aneurysm or AVM  CT repeat stable right thalamic hemorrhage with mild intraventricular extension.  2D Echo EF 65-70%  CUS unremarkable  LDL 78  HgbA1c 5.5  UDS negative  SCDs for VTE prophylaxis  No antithrombotic prior to admission, now on No antithrombotic due to ICH  Ongoing aggressive stroke risk factor management  Therapy recommendations: Pending  Disposition: Pending  Cerebral edema  CT and MRI showed midline shift 57mm  CT repeat stable.  On 3% saline @ 75->40 to avoid fluid overload  Na 141-144-147->142  Lasix 40 x 1  Sodium Q6  Hypertensive emergency . Home medication metoprolol 100 twice daily, HCTZ, losartan . Unstable . Off cleviprex since yesterday  . Amlodipine 10, metoprolol 100 twice daily . HCTZ 25, losartan 50 twice daily, clonidine 0.1 3 times daily  Long term BP goal normotensive  Hyperlipidemia  Home meds: Lipitor 40  LDL 78, goal < 70  Now on no statin given recent  ICH  Resume statin at discharge  AKI  Cre 0.9->1.68->1.19  On IVF   BMP monitoring  Dysphagia  Speech on board  dys 3 and nectar thick -> dysphagia 2 and thin liquid  Aspiration precaution  Low-grade fever  Tmax 101.2->100.2->100.3  Tachycardia   Blood culture no growth   UA WBC 6-10  COVID PCR neg  CT chest basilar atelectasis  Alcohol abuse  About 24 drinks per week  On CIWA protocol  FA/MVI/B1  Other Stroke Risk Factors  Obesity, Body mass index is 36.03 kg/m.   Likely obstructive sleep apnea, undiagnosed  Other Active Problems    Hospital day # 5  06/14/2020 3:10 PM    To contact Stroke Continuity provider, please refer to [email or web address]. After hours, contact General Neurology

## 2020-06-14 NOTE — Progress Notes (Signed)
PROGRESS NOTE    [name]  OHY:[id number] DOB: [birth year] DOA: 06/09/2020 PCP: Pcp, No    Brief Narrative:  55 year old gentleman with history of hypertension, nonischemic dilated cardiomyopathy, hyperlipidemia, chronic diastolic heart failure, noncompliant to medication, ongoing alcohol use presented to the emergency room as code stroke.  Went to bed at 10 PM night before, his wife went to work early morning and did not notice him going to work through her doorbell camera and came home only to find that he was lying in the bathroom with right gaze preference and right-sided weakness.  Called EMS, blood pressure 200-230. In the emergency room, hypertensive.  Stat CT scan showed acute right thalamic hemorrhage with extension to ventricles and 4 mm shift.  Given aggressive antihypertensives, 3% saline and admitted to the neuro ICU. 5/23, transferred out of neuro ICU.  Recommended CIR.  Off antihypertensive drips.  Episodes of confusion and anxiety treated for alcohol withdrawal with Ativan.   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 CT head findings, 4 mm midline shift, right thalamic intracranial hemorrhage.  Repeat CT scan with no expansion of hematoma. MRI of the brain, stable right thalamic ICH. MRA of the head, no brain aneurysm. 2D echocardiogram, stable.  Echocardiogram with ejection fraction 65 to 70%. Antiplatelet therapy, contraindicated due to hemorrhage. LDL, 78.  Statins on hold due to acute intracranial hemorrhage. Hemoglobin A1c, 5.5.  No indication for treatment. DVT prophylaxis, SCD.  Back on Lovenox. Therapy recommendations, acute inpatient rehab.  Stabilizing. Patient is off 3% saline.  Sodium is stable. Patient is on dysphagia 3  diet.  Hypertensive emergency: At home on metoprolol 100 mg twice a day, hydrochlorothiazide and losartan.  Patient was treated with Cleviprex drip and tapered off 5/22. Currently on amlodipine 10, metoprolol 100 twice a day, losartan 50 mg twice daily, hydrochlorothiazide 25 mg daily, clonidine 0.1 mg 3 times a day.  Also on as needed hydralazine.  We will gradually uptitrate oral medications.  Long-term blood pressure goal is normotensive.  Acute kidney injury: Due to hemodynamic changes.  Fairly stabilized.  Alcohol use disorder with alcohol withdrawal: Continue to counsel her and he is receptive.  Currently remains on symptom-based CIWA scale and multivitamins.  Close monitoring.  Continue to work with PT OT.  Anticipating transfer to acute inpatient rehab next 24 to 48 hours. Patient can be transferred to telemetry bed.  DVT prophylaxis: enoxaparin (LOVENOX) injection 40 mg Start: 06/12/20 1200   Code Status: Full code Family Communication: None today. Disposition Plan: Status is: Inpatient  Remains inpatient appropriate because:Unsafe d/c plan   Dispo: The patient is from: Home              Anticipated d/c is to: CIR              Patient currently is not medically stable to d/c.   Difficult to place patient No         Consultants:   Neurology  Procedures:   None  Antimicrobials:   None   Subjective: Patient seen and examined.  Overnight he was given 2 doses of Ativan and was very sleepy in the morning.  He was responding to stimuli but unable to keep up with conversation. Later on, he is awake and able to participate with therapies.  Objective: Vitals:   06/14/20 0700  06/14/20 0800 06/14/20 0900 06/14/20 1000  BP: 130/88 136/75 140/76 (!) 145/78  Pulse: 75 75 74 74  Resp: (!) 30 (!) 30 (!) 30 (!) 24  Temp:  98.9 F (37.2 C)    TempSrc:  Oral    SpO2: 94% 95% 95% 96%  Weight:      Height:        Intake/Output Summary (Last 24 hours) at 06/14/2020  1119 Last data filed at 06/14/2020 0500 Gross per 24 hour  Intake --  Output 2250 ml  Net -2250 ml   Filed Weights   06/09/20 1626 06/09/20 2000  Weight: 100.2 kg 113.9 kg    Examination:  General exam: Appears calm and comfortable  Mostly sleepy after receiving medications. Respiratory system: Clear to auscultation. Respiratory effort normal.  Conducted airway noise. Cardiovascular system: S1 & S2 heard, RRR.  Gastrointestinal system: Abdomen is nondistended, soft and nontender. No organomegaly or masses felt. Normal bowel sounds heard. Central nervous system: Alert and oriented.  He has mild dysarthria. Left-sided facial droop Right upper and lower extremity normal sensory and power exam. Left upper extremity and left lower extremity with motor weakness 2/5.    Data Reviewed: I have personally reviewed following labs and imaging studies  CBC: Recent Labs  Lab 06/09/20 1602 06/09/20 1608 06/10/20 0724 06/11/20 0433 06/12/20 0420 06/13/20 0412 06/14/20 0516  WBC 9.3  --  8.4 10.9* 6.9 7.5 7.0  NEUTROABS 7.8*  --   --   --   --   --   --   HGB 13.8   < > 12.4* 12.2* 11.1* 11.2* 11.3*  HCT 41.1   < > 37.4* 36.6* 34.8* 33.9* 33.7*  MCV 93.4  --  96.6 97.1 97.2 94.4 93.6  PLT 260  --  270 278 216 235 239   < > = values in this interval not displayed.   Basic Metabolic Panel: Recent Labs  Lab 06/10/20 0724 06/10/20 1658 06/11/20 0433 06/11/20 1030 06/11/20 2233 06/12/20 0420 06/12/20 0953 06/13/20 0412 06/14/20 0516  NA 144   < > 142   < > 142 144 141 134* 131*  K 4.3  --  4.1  --   --  3.3*  --  3.6 4.0  CL 114*  --  113*  --   --  108  --  102 96*  CO2 24  --  23  --   --  27  --  26 26  GLUCOSE 163*  --  146*  --   --  111*  --  119* 127*  BUN 16  --  7  --   --  6  --  10 18  CREATININE 1.68*  --  1.19  --   --  1.21  --  1.13 1.18  CALCIUM 8.5*  --  8.5*  --   --  8.6*  --  8.7* 9.0   < > = values in this interval not displayed.   GFR: Estimated  Creatinine Clearance: 88.4 mL/min (by C-G formula based on SCr of 1.18 mg/dL). Liver Function Tests: Recent Labs  Lab 06/09/20 1602  AST 40  ALT 38  ALKPHOS 66  BILITOT 0.9  PROT 7.6  ALBUMIN 4.2   No results for input(s): LIPASE, AMYLASE in the last 168 hours. No results for input(s): AMMONIA in the last 168 hours. Coagulation Profile: Recent Labs  Lab 06/09/20 1602  INR 1.0   Cardiac Enzymes: No results for input(s): CKTOTAL, CKMB,  CKMBINDEX, TROPONINI in the last 168 hours. BNP (last 3 results) No results for input(s): PROBNP in the last 8760 hours. HbA1C: No results for input(s): HGBA1C in the last 72 hours. CBG: Recent Labs  Lab 06/09/20 1607  GLUCAP 123*   Lipid Profile: Recent Labs    06/12/20 0420  TRIG 153*   Thyroid Function Tests: No results for input(s): TSH, T4TOTAL, FREET4, T3FREE, THYROIDAB in the last 72 hours. Anemia Panel: No results for input(s): VITAMINB12, FOLATE, FERRITIN, TIBC, IRON, RETICCTPCT in the last 72 hours. Sepsis Labs: No results for input(s): PROCALCITON, LATICACIDVEN in the last 168 hours.  Recent Results (from the past 240 hour(s))  MRSA PCR Screening     Status: None   Collection Time: 06/09/20  8:00 PM   Specimen: Nasal Mucosa; Nasopharyngeal  Result Value Ref Range Status   MRSA by PCR NEGATIVE NEGATIVE Final    Comment:        The GeneXpert MRSA Assay (FDA approved for NASAL specimens only), is one component of a comprehensive MRSA colonization surveillance program. It is not intended to diagnose MRSA infection nor to guide or monitor treatment for MRSA infections. Performed at [facility], [address], [place], [place] [address]   SARS CORONAVIRUS 2 (TAT 6-24 HRS)     Status: None   Collection Time: 06/10/20  1:21 AM  Result Value Ref Range Status   SARS Coronavirus 2 NEGATIVE NEGATIVE Final    Comment: (NOTE) SARS-CoV-2 target nucleic acids are NOT DETECTED.  The SARS-CoV-2 RNA is generally  detectable in upper and lower respiratory specimens during the acute phase of infection. Negative results do not preclude SARS-CoV-2 infection, do not rule out co-infections with other pathogens, and should not be used as the sole basis for treatment or other patient management decisions. Negative results must be combined with clinical observations, patient history, and epidemiological information. The expected result is Negative.  Fact Sheet for Patients: HairSlick.no  Fact Sheet for Healthcare Providers: quierodirigir.com  This test is not yet approved or cleared by the [place] FDA and  has been authorized for detection and/or diagnosis of SARS-CoV-2 by FDA under an Emergency Use Authorization (EUA). This EUA will remain  in effect (meaning this test can be used) for the duration of the COVID-19 declaration under Se ction 564(b)(1) of the Act, 21 U.S.C. section 360bbb-3(b)(1), unless the authorization is terminated or revoked sooner.  Performed at [facility], [address] [place], [place] [address]   Culture, blood (Routine X 2) w Reflex to ID Panel     Status: None (Preliminary result)   Collection Time: 06/10/20  7:41 AM   Specimen: BLOOD LEFT HAND  Result Value Ref Range Status   Specimen Description BLOOD LEFT HAND  Final   Special Requests   Final    BOTTLES DRAWN AEROBIC AND ANAEROBIC Blood Culture adequate volume   Culture   Final    NO GROWTH 4 DAYS Performed at [facility], [address], [place], [place] [address]    Report Status PENDING  Incomplete  Culture, blood (Routine X 2) w Reflex to ID Panel     Status: None (Preliminary result)   Collection Time: 06/10/20  7:52 AM   Specimen: BLOOD RIGHT HAND  Result Value Ref Range Status   Specimen Description BLOOD RIGHT HAND  Final   Special Requests   Final    BOTTLES DRAWN AEROBIC AND ANAEROBIC Blood Culture results may not be optimal  due to an inadequate volume of blood received in culture bottles   Culture   Final    NO GROWTH 4 DAYS Performed at [facility], [address], [place], [place] [address]    Report Status PENDING  Incomplete         Radiology Studies: No results found.      Scheduled Meds: .  stroke: mapping our early stages of recovery book   Does not apply Once  . amLODipine  10 mg Oral Daily  . Chlorhexidine Gluconate Cloth  6 each Topical Daily  . cloNIDine  0.3 mg Oral TID  . enoxaparin (LOVENOX) injection  40 mg Subcutaneous Q24H  . folic acid  1 mg Oral Daily  . hydrochlorothiazide  25 mg Oral QODAY  . losartan  50 mg Oral BID  . mouth rinse  15 mL Mouth Rinse BID  . metoprolol tartrate  100 mg Oral BID  . multivitamin with minerals  1 tablet Oral Daily  . pantoprazole  40 mg Oral Daily  . senna-docusate  1 tablet Oral BID  . thiamine injection  100 mg Intravenous Daily   Or  . thiamine  100 mg Oral Daily   Continuous Infusions: . clevidipine Stopped (06/13/20 0933)     LOS: 5 days    Time spent: 40 minutes    Dorcas Carrow, MD [facility] Pager [phone]

## 2020-06-14 NOTE — Progress Notes (Signed)
  Speech Language Pathology Treatment: Dysphagia;Cognitive-Linquistic  Patient Details Name: [name] MRN: [id number] DOB: Nov 09, 1965 Today's Date: 06/14/2020 Time: 2683-4196 SLP Time Calculation (min) (ACUTE ONLY): 13 min  Assessment / Plan / Recommendation Clinical Impression  [name] required moderate verbal and tactile cues to arouse for dysphagia and cognitive abilities. He has been receiving Ativan daily on the CIWA protocol. Pt has been tolerating recommended texture per ST note yesterday and RN report this morning. Recommending upgrade to Dys 3 after demonstrating clearance with solid trial. He was able to recall strategy for pocketing. No indications of airway compromise with consistent straw sips thin.  RN reports mild hallucination this morning not observed during this session. He was oriented to time, place and situation. Once awakened with tactile cues he was able maintain during session. Continue treatment for attention, problem solving, awareness, and memory.    HPI HPI: [name] is a 55 y.o. male with a PMHx of obesity, HTN, ETOH abuse ( pk/day x 10 yrs), HLD, Cs/dHF who presents after being found down CT revealed acute right thalamic hemorrhage with extension to the ventricles. 20mm shift.      SLP Plan  Continue with current plan of care       Recommendations  Diet recommendations: Dysphagia 3 (mechanical soft);Thin liquid Liquids provided via: Cup;Straw Medication Administration: Whole meds with puree Supervision: Patient able to self feed;Staff to assist with self feeding Compensations: Slow rate;Small sips/bites;Lingual sweep for clearance of pocketing Postural Changes and/or Swallow Maneuvers: Seated upright 90 degrees;Upright 30-60 min after meal                General recommendations: Rehab consult Oral Care Recommendations: Oral care BID Follow up Recommendations: Inpatient Rehab SLP Visit Diagnosis: Dysphagia, unspecified (R13.10);Cognitive  communication deficit (Q22.979) Plan: Continue with current plan of care                       Royce Macadamia 06/14/2020, 10:49 AM  Breck Coons Lonell Face.Ed Nurse, children's [phone] Office [phone]

## 2020-06-14 NOTE — Progress Notes (Signed)
Physical Therapy Treatment Patient Details Name: [name] MRN: [id number] DOB: 05/25/1965 Today's Date: 06/14/2020    History of Present Illness Pt is a 55 y.o. male who presented 5/18 with L sided weakness, numbness, and gaze. No tPA administered due to ICH. 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 PMH: HTN, obesity, cardiomyopathy, hyperlipidemia, and CHF.    PT Comments    The pt was able to demo good progress with seated balance and functional strength for OOB mobility this session. The pt continues to benefit from increased time and sequential cues, but was able to follow commands to use RUE/RLE and core to complete bed mobility and rolling with min-modA only at this time. The pt was then able to complete 10 min sitting balance exercises to include dynamic reaching outside BOS, improved postural activation and alignment, and LE movements. The pt also completed x5 sit-stands from the EOB with maxA of 2 with attention to block L knee and assist with positioning of LLE. The pt required assist and increased cues to manage hip extension, upright posture, and elongation of L trunk, but was able to adopt verbal and tactile cues for 3-5 seconds at a time. The pt remains highly motivated, continue to recommend CIR level therapies at d/c.     Follow Up Recommendations  CIR;Supervision/Assistance - 24 hour     Equipment Recommendations  Rolling walker with 5" wheels;3in1 (PT);Wheelchair cushion (measurements PT);Wheelchair (measurements PT)    Recommendations for Other Services       Precautions / Restrictions Precautions Precautions: Fall Precaution Comments: L hemi Restrictions Weight Bearing Restrictions: No    Mobility  Bed Mobility Overal bed mobility: Needs Assistance Bed Mobility: Rolling;Sidelying to Sit;Sit to Sidelying Rolling: Mod assist;Min guard (minG to L, modA to complete roll to R) Sidelying to sit: Mod  assist       General bed mobility comments: modA to safely manage hip positionand proximity to EOB with rolling to R sidelying, the pt was then able to initiate push to sit with cues at trunk and L pelvis. Pt with good attempt to use RLE to move LLE back into bed. able to bridge to lift hips and reposition    Transfers Overall transfer level: Needs assistance Equipment used: 2 person hand held assist Transfers: Sit to/from [employer] Sit to Stand: Max assist;+2 physical assistance   Squat pivot transfers: Max assist;+2 physical assistance     General transfer comment: maxA to power up with maxA to L knee to block, cues for posture, elongating L trunk. completed x5 sit-stand with x3 mini squats in each one  Ambulation/Gait             General Gait Details: Unable       Modified Rankin (Stroke Patients Only) Modified Rankin (Stroke Patients Only) Pre-Morbid Rankin Score: No symptoms Modified Rankin: Severe disability     Balance Overall balance assessment: Needs assistance Sitting-balance support: Feet supported;Single extremity supported Sitting balance-Leahy Scale: Poor Sitting balance - Comments: minG to minA to correct. Verbal cues for posture, elongation of L trunk Postural control: Posterior lean;Left lateral lean Standing balance support: Bilateral upper extremity supported Standing balance-Leahy Scale: Poor Standing balance comment: MaxAx2 and UE support to stand statically with L knee block.                            Cognition Arousal/Alertness: Awake/alert Behavior  During Therapy: WFL for tasks assessed/performed Overall Cognitive Status: Impaired/Different from baseline Area of Impairment: Attention;Memory;Safety/judgement;Awareness;Problem solving                   Current Attention Level: Alternating Memory: Decreased short-term memory Following Commands: Follows one step commands with increased time;Follows  multi-step commands inconsistently Safety/Judgement: Decreased awareness of safety;Decreased awareness of deficits Awareness: Intellectual Problem Solving: Slow processing;Decreased initiation;Difficulty sequencing;Requires verbal cues;Requires tactile cues General Comments: Pt alert, able to follow most simple cues given this session. Needing increased time or cues for sequencing of movements at this time, but able to adopt and maintain cues for 3-5 seconds at a time. pt very motivated      Exercises Other Exercises Other Exercises: sit-stand from EOB with L knee blocked, maxA of 2. x3 mini squats with each rep Other Exercises: seated dynamic reaching with RUE. pt able to accurately punch targets outside of his BOS with mild LOB and was then able to correct with increased time and verbal cues    General Comments General comments (skin integrity, edema, etc.): VSS on RA, wife arrived at end of session      Pertinent Vitals/Pain Pain Assessment: Faces Faces Pain Scale: No hurt Pain Intervention(s): Monitored during session           PT Goals (current goals can now be found in the care plan section) Acute Rehab PT Goals Patient Stated Goal: Pt hopeful to improve, both pt and spouse hoping for CIR PT Goal Formulation: With patient/family Time For Goal Achievement: 06/25/20 Potential to Achieve Goals: Good Progress towards PT goals: Progressing toward goals    Frequency    Min 4X/week      PT Plan Current plan remains appropriate       AM-PAC PT "6 Clicks" Mobility   Outcome Measure  Help needed turning from your back to your side while in a flat bed without using bedrails?: A Lot Help needed moving from lying on your back to sitting on the side of a flat bed without using bedrails?: A Lot Help needed moving to and from a bed to a chair (including a wheelchair)?: Total Help needed standing up from a chair using your arms (e.g., wheelchair or bedside chair)?: Total Help  needed to walk in hospital room?: Total Help needed climbing 3-5 steps with a railing? : Total 6 Click Score: 8    End of Session Equipment Utilized During Treatment: Gait belt Activity Tolerance: Patient tolerated treatment well Patient left: in bed;with call bell/phone within reach;with bed alarm set;with nursing/sitter in room;with family/visitor present Nurse Communication: Mobility status PT Visit Diagnosis: Unsteadiness on feet (R26.81);Muscle weakness (generalized) (M62.81);Difficulty in walking, not elsewhere classified (R26.2);Other symptoms and signs involving the nervous system (R29.898);Hemiplegia and hemiparesis Hemiplegia - Right/Left: Left Hemiplegia - dominant/non-dominant: Non-dominant Hemiplegia - caused by: Nontraumatic intracerebral hemorrhage     Time: 1543-1611 PT Time Calculation (min) (ACUTE ONLY): 28 min  Charges:  $Therapeutic Exercise: 8-22 mins $Neuromuscular Re-education: 8-22 mins                     [name], PT, DPT   Acute Rehabilitation Department Pager #: [phone]   [name] 06/14/2020, 4:37 PM

## 2020-06-15 DIAGNOSIS — I10 Essential (primary) hypertension: Secondary | ICD-10-CM | POA: Diagnosis not present

## 2020-06-15 DIAGNOSIS — I610 Nontraumatic intracerebral hemorrhage in hemisphere, subcortical: Secondary | ICD-10-CM | POA: Diagnosis not present

## 2020-06-15 LAB — CULTURE, BLOOD (ROUTINE X 2)
Culture: NO GROWTH
Culture: NO GROWTH
Special Requests: ADEQUATE

## 2020-06-15 MED ORDER — LABETALOL HCL 200 MG PO TABS
200.0000 mg | ORAL_TABLET | Freq: Two times a day (BID) | ORAL | Status: DC
  Administered 2020-06-15 – 2020-06-18 (×6): 200 mg via ORAL
  Filled 2020-06-15 (×7): qty 1

## 2020-06-15 MED ORDER — TRAZODONE HCL 50 MG PO TABS
50.0000 mg | ORAL_TABLET | Freq: Every evening | ORAL | Status: DC | PRN
  Administered 2020-06-15 – 2020-06-16 (×3): 50 mg via ORAL
  Filled 2020-06-15 (×3): qty 1

## 2020-06-15 NOTE — Progress Notes (Signed)
This chaplain responded to the MD consult for creating and updating the Pt. Advance Directive.  The Pt. RN-Karissa updated the chaplain before the visit.  The chaplain gave the Advance Directive education and paperwork to the RN to give to the Pt. and/or the Pt. wife the next time she visits.    Spiritual care will F/U with the Pt. and wife as needed.

## 2020-06-15 NOTE — Progress Notes (Signed)
Occupational Therapy Treatment Patient Details Name: [name] MRN: [id number] DOB: 1965-04-20 Today's Date: 06/15/2020    History of present illness Pt is a 55 y.o. male who presented 5/18 with L sided weakness, numbness, and gaze. No tPA administered due to ICH. 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 PMH: HTN, obesity, cardiomyopathy, hyperlipidemia, and CHF.   OT comments  Pt seen as cotreat with PT to further progress mobilityand functional movement patterns. Excellent session, demonstrating increased ability to use verbal and tactile cues for feedback to improve midline postural control sitting EOB and while in [place]. Poor visaul attention and abnormal scanning patterns noted with mod vc to locate items in L visual field. Pt with poor sensation and proprioception of LUE, impacting movement and increasing risk of injury to LUE. Working on pattern of pt visually locating his L arm, then using his RUE to position LUE before each movement. Updated wife over the phone on her husband's progress. Excellent CIR candidate. Pt very motivated to become more independent and wife exteremly supportive. Will continue to follow acutely.   Follow Up Recommendations  CIR    Equipment Recommendations  3 in 1 bedside commode;Wheelchair (measurements OT);Wheelchair cushion (measurements OT);Hospital bed    Recommendations for Other Services Rehab consult    Precautions / Restrictions Precautions Precautions: Fall       Mobility Bed Mobility Overal bed mobility: Needs Assistance Bed Mobility: Rolling;Supine to Sit Rolling: Min assist (to R)   Supine to sit: Max assist;+2 for physical assistance     General bed mobility comments: HOB increased ; pt pushing from bed    Transfers Overall transfer level: Needs assistance   Transfers: Sit to/from Stand Sit to Stand: Mod assist;+2 physical assistance (From elevated surface Mod A  only to control position of LLE on Stedy plate)              Balance     Sitting balance-Leahy Scale: Poor Sitting balance - Comments: L lateral lean. With VC, pt able to maintain midline postural control; posture affected by attentional deficits Postural control: Left lateral lean   Standing balance-Leahy Scale: Poor Standing balance comment: L lateral lean                           ADL either performed or assessed with clinical judgement   ADL Overall ADL's : Needs assistance/impaired Eating/Feeding: Set up;Sitting (will further assess; unsure if pt attending to full tray)   Grooming: Minimal assistance Grooming Details (indicate cue type and reason): used toothette to brush teeth; not attempting to use L hand during activity Upper Body Bathing: Moderate assistance;Bed level   Lower Body Bathing: Maximal assistance;Bed level   Upper Body Dressing : Moderate assistance;Bed level   Lower Body Dressing: Total assistance       Toileting- Clothing Manipulation and Hygiene: Total assistance       Functional mobility during ADLs: +2 for physical assistance;Moderate assistance (sit - stand)       Vision   Vision Assessment?: Yes Eye Alignment: Within Functional Limits Ocular Range of Motion: Within Functional Limits Tracking/Visual Pursuits: Decreased smoothness of horizontal tracking Saccades: Additional head turns occurred during testing;Additional eye shifts occurred during testing;Decreased speed of saccadic movement;Impaired - to be further tested in functional context Convergence: Within functional limits Visual Fields: Impaired-to be further tested in functional context;Left visual field deficit (Pt not attending to objects  in L field adn requires VC to locate items) Diplopia Assessment:  (denies diplopia) Depth Perception: Overshoots Additional Comments: wears glasses - asked wife to bring in from home   Perception     Praxis      Cognition  Arousal/Alertness: Awake/alert Behavior During Therapy: Impulsive (at times) Overall Cognitive Status: Impaired/Different from baseline Area of Impairment: Attention;Following commands;Safety/judgement;Awareness;Problem solving                   Current Attention Level: Selective Memory: Decreased recall of precautions Following Commands: Follows one step commands with increased time Safety/Judgement: Decreased awareness of safety;Decreased awareness of deficits Awareness: Emergent Problem Solving: Slow processing;Requires tactile cues General Comments: good carry over of cuing to maintain R lateral weight shift and increase attention to L side        Exercises     Shoulder Instructions       General Comments VC and tactile cues used throughout session. Pt given increased timeto process adn was able to self correct by end of session. Difficulty maintaining visual attention, especially in L field. Chair set up to increase attntion to L. Facilitated lateral weight shift adn diagonal/cross body movement patterns throughout session. Used verbal cue "fix it" and asking to use errors to help problem solve how to self-correct which worked well for him.    Pertinent Vitals/ Pain       Pain Assessment: Faces Faces Pain Scale: No hurt  Home Living                                          Prior Functioning/Environment              Frequency  Min 2X/week        Progress Toward Goals  OT Goals(current goals can now be found in the care plan section)  Progress towards OT goals: Progressing toward goals  Acute Rehab OT Goals Patient Stated Goal: to get rehab adn get better OT Goal Formulation: With patient/family (spoke with wife over the phone) Time For Goal Achievement: 06/26/20 Potential to Achieve Goals: Good ADL Goals Pt Will Perform Grooming: with set-up;sitting Pt Will Transfer to Toilet: with mod assist;squat pivot transfer;bedside  commode Pt/caregiver will Perform Home Exercise Program: Increased strength;Left upper extremity;With written HEP provided;With minimal assist Additional ADL Goal #1: Pt will perform higher level cognitive task with <3 errors in 3/5 trials. Additional ADL Goal #2: Pt will perform x10 mins of OOB ADL tasks with minimal cues for attending to task.  Plan Discharge plan remains appropriate    Co-evaluation    PT/OT/SLP Co-Evaluation/Treatment: Yes Reason for Co-Treatment: Complexity of the patient's impairments (multi-system involvement);For patient/therapist safety;To address functional/ADL transfers   OT goals addressed during session: ADL's and self-care;Strengthening/ROM      AM-PAC OT "6 Clicks" Daily Activity     Outcome Measure   Help from another person eating meals?: A Little Help from another person taking care of personal grooming?: A Little Help from another person toileting, which includes using toliet, bedpan, or urinal?: Total Help from another person bathing (including washing, rinsing, drying)?: A Lot Help from another person to put on and taking off regular upper body clothing?: A Lot Help from another person to put on and taking off regular lower body clothing?: Total 6 Click Score: 12    End of Session Equipment Utilized During Treatment: Gait belt  OT Visit Diagnosis: Unsteadiness on feet (R26.81);Muscle weakness (generalized) (M62.81);Pain;Other symptoms and signs involving cognitive function;Other symptoms and signs involving the nervous system (R29.898);Hemiplegia and hemiparesis Hemiplegia - Right/Left: Left Hemiplegia - dominant/non-dominant: Non-Dominant Hemiplegia - caused by: Nontraumatic intracerebral hemorrhage   Activity Tolerance Patient tolerated treatment well   Patient Left in chair;with call bell/phone within reach;with chair alarm set   Nurse Communication Mobility status;Need for lift equipment        Time: 1059-1150 OT Time Calculation  (min): 51 min  Charges: OT General Charges $OT Visit: 1 Visit OT Treatments $Self Care/Home Management : 8-22 mins $Neuromuscular Re-education: 8-22 mins  Luisa Dago, OT/L   Acute OT Clinical Specialist Acute Rehabilitation Services Pager [phone] Office [phone]    [facility] 06/15/2020, 2:30 PM

## 2020-06-15 NOTE — Progress Notes (Signed)
PROGRESS NOTE    [name]  YHC:[id number] DOB: November 07, 1965 DOA: 06/09/2020 PCP: Pcp, No    Brief Narrative:  55 year old gentleman with history of hypertension, nonischemic dilated cardiomyopathy, hyperlipidemia, chronic diastolic heart failure, noncompliant to medication, ongoing alcohol use presented to the emergency room as code stroke.  Went to bed at 10 PM night before, his wife went to work early morning and did not notice him going to work through her doorbell camera and came home only to find that he was lying in the bathroom with right gaze preference and right-sided weakness.  Called EMS, blood pressure 200-230. In the emergency room, hypertensive.  Stat CT scan showed acute right thalamic hemorrhage with extension to ventricles and 4 mm shift.  Given aggressive antihypertensives, 3% saline and admitted to the neuro ICU. 5/23, transferred out of neuro ICU.  Recommended CIR.  Off antihypertensive drips.  Episodes of confusion and anxiety treated for alcohol withdrawal with Ativan.  Stabilizing.   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 CT head findings, 4 mm midline shift, right thalamic intracranial hemorrhage.  Repeat CT scan with no expansion of hematoma. MRI of the brain, stable right thalamic ICH. MRA of the head, no brain aneurysm. 2D echocardiogram, stable.  Echocardiogram with ejection fraction 65 to 70%. Antiplatelet therapy, contraindicated due to hemorrhage. LDL, 78.  Statins on hold due to acute intracranial hemorrhage. Hemoglobin A1c, 5.5.  No indication for treatment. DVT prophylaxis, SCD.  Back on Lovenox. Therapy recommendations, acute inpatient rehab.  Stabilizing. Patient is off 3% saline.  Sodium is stable. Patient is on  dysphagia 3 diet and tolerating.  Hypertensive emergency: At home on metoprolol 100 mg twice a day, hydrochlorothiazide and losartan.  Patient was treated with Cleviprex drip and tapered off 5/22. Amlodipine 10 mg Losartan 50 mg twice a day Hydrochlorothiazide 25 mg every other day Clonidine 0.1 mg 3 times a day, increase dose to 0.3 mg 3 times a day Metoprolol 100 mg twice a day, will change to labetalol 200 mg 2 times a day and uptitrate as needed. Blood pressure stable, however will work further to normalize in the next few days.  Acute kidney injury: Due to hemodynamic changes.  Fairly stabilized.  Alcohol use disorder with alcohol withdrawal: Motivated to quit. Currently remains on symptom-based CIWA scale and multivitamins.  Close monitoring. Patient did not need any Ativan for last 24 hours, discontinue all sedating's.  Continue to mobilize.  Medically stable to transfer to acute inpatient rehab when bed available.   DVT prophylaxis: enoxaparin (LOVENOX) injection 40 mg Start: 06/12/20 1200   Code Status: Full code Family Communication: None today. Disposition Plan: Status is: Inpatient  Remains inpatient appropriate because:Unsafe d/c plan   Dispo: The patient is from: Home              Anticipated d/c is to: CIR              Patient currently is medically stable for CIR level of care.   Difficult to place patient No         Consultants:   Neurology  Procedures:   None  Antimicrobials:   None   Subjective: Patient seen and examined.  He could not sleep well last night because of too much disturbance.  He tells me that he is day and night  is all mixed up.  Denies any complaints today.  Cannot move his left hand, however he was able to move his left leg and was excited about it. Blood pressure is 138-160 systolic before morning medications.  Objective: Vitals:   06/14/20 1947 06/14/20 2318 06/15/20 0342 06/15/20 0803  BP: 138/77 139/75 119/64 (!)  160/95  Pulse: 73 74 68 65  Resp: (!) 24 18 19 20   Temp: 99.2 F (37.3 C) 99.7 F (37.6 C) 98.9 F (37.2 C)   TempSrc: Axillary Axillary Oral   SpO2: 99% 99% 96% 95%  Weight:      Height:        Intake/Output Summary (Last 24 hours) at 06/15/2020 1130 Last data filed at 06/14/2020 2330 Gross per 24 hour  Intake 240 ml  Output 4200 ml  Net -3960 ml   Filed Weights   06/09/20 1626 06/09/20 2000  Weight: 100.2 kg 113.9 kg    Examination:  General exam: Appears calm and comfortable , Respiratory system: Clear to auscultation. Respiratory effort normal.  No added sound. Cardiovascular system: S1 & S2 heard, RRR.  Gastrointestinal system: Abdomen is nondistended, soft and nontender. No organomegaly or masses felt. Normal bowel sounds heard. Central nervous system: Alert and oriented.  He has mild dysarthria. Mild Left-sided facial droop Right upper and lower extremity normal sensory and power exam. Left upper extremity weak, 1/5.  Loss of superficial sensation. left lower extremity with motor weakness 2/5.  Loss of superficial sensation.    Data Reviewed: I have personally reviewed following labs and imaging studies  CBC: Recent Labs  Lab 06/09/20 1602 06/09/20 1608 06/10/20 0724 06/11/20 0433 06/12/20 0420 06/13/20 0412 06/14/20 0516  WBC 9.3  --  8.4 10.9* 6.9 7.5 7.0  NEUTROABS 7.8*  --   --   --   --   --   --   HGB 13.8   < > 12.4* 12.2* 11.1* 11.2* 11.3*  HCT 41.1   < > 37.4* 36.6* 34.8* 33.9* 33.7*  MCV 93.4  --  96.6 97.1 97.2 94.4 93.6  PLT 260  --  270 278 216 235 239   < > = values in this interval not displayed.   Basic Metabolic Panel: Recent Labs  Lab 06/10/20 0724 06/10/20 1658 06/11/20 0433 06/11/20 1030 06/11/20 2233 06/12/20 0420 06/12/20 0953 06/13/20 0412 06/14/20 0516  NA 144   < > 142   < > 142 144 141 134* 131*  K 4.3  --  4.1  --   --  3.3*  --  3.6 4.0  CL 114*  --  113*  --   --  108  --  102 96*  CO2 24  --  23  --   --  27   --  26 26  GLUCOSE 163*  --  146*  --   --  111*  --  119* 127*  BUN 16  --  7  --   --  6  --  10 18  CREATININE 1.68*  --  1.19  --   --  1.21  --  1.13 1.18  CALCIUM 8.5*  --  8.5*  --   --  8.6*  --  8.7* 9.0   < > = values in this interval not displayed.   GFR: Estimated Creatinine Clearance: 88.4 mL/min (by C-G formula based on SCr of 1.18 mg/dL). Liver Function Tests: Recent Labs  Lab 06/09/20 1602  AST 40  ALT 38  ALKPHOS 66  BILITOT 0.9  PROT 7.6  ALBUMIN 4.2   No results for input(s): LIPASE, AMYLASE in the last 168 hours. No results for input(s): AMMONIA in the last 168 hours. Coagulation Profile: Recent Labs  Lab 06/09/20 1602  INR 1.0   Cardiac Enzymes: No results for input(s): CKTOTAL, CKMB, CKMBINDEX, TROPONINI in the last 168 hours. BNP (last 3 results) No results for input(s): PROBNP in the last 8760 hours. HbA1C: No results for input(s): HGBA1C in the last 72 hours. CBG: Recent Labs  Lab 06/09/20 1607  GLUCAP 123*   Lipid Profile: No results for input(s): CHOL, HDL, LDLCALC, TRIG, CHOLHDL, LDLDIRECT in the last 72 hours. Thyroid Function Tests: No results for input(s): TSH, T4TOTAL, FREET4, T3FREE, THYROIDAB in the last 72 hours. Anemia Panel: No results for input(s): VITAMINB12, FOLATE, FERRITIN, TIBC, IRON, RETICCTPCT in the last 72 hours. Sepsis Labs: No results for input(s): PROCALCITON, LATICACIDVEN in the last 168 hours.  Recent Results (from the past 240 hour(s))  MRSA PCR Screening     Status: None   Collection Time: 06/09/20  8:00 PM   Specimen: Nasal Mucosa; Nasopharyngeal  Result Value Ref Range Status   MRSA by PCR NEGATIVE NEGATIVE Final    Comment:        The GeneXpert MRSA Assay (FDA approved for NASAL specimens only), is one component of a comprehensive MRSA colonization surveillance program. It is not intended to diagnose MRSA infection nor to guide or monitor treatment for MRSA infections. Performed at [facility], [address], [place], [place] [address]   SARS CORONAVIRUS 2 (TAT 6-24 HRS)     Status: None   Collection Time: 06/10/20  1:21 AM  Result Value Ref Range Status   SARS Coronavirus 2 NEGATIVE NEGATIVE Final    Comment: (NOTE) SARS-CoV-2 target nucleic acids are NOT DETECTED.  The SARS-CoV-2 RNA is generally detectable in upper and lower respiratory specimens during the acute phase of infection. Negative results do not preclude SARS-CoV-2 infection, do not rule out co-infections with other pathogens, and should not be used as the sole basis for treatment or other patient management decisions. Negative results must be combined with clinical observations, patient history, and epidemiological information. The expected result is Negative.  Fact Sheet for Patients: HairSlick.no  Fact Sheet for Healthcare Providers: quierodirigir.com  This test is not yet approved or cleared by the [place] FDA and  has been authorized for detection and/or diagnosis of SARS-CoV-2 by FDA under an Emergency Use Authorization (EUA). This EUA will remain  in effect (meaning this test can be used) for the duration of the COVID-19 declaration under Se ction 564(b)(1) of the Act, 21 U.S.C. section 360bbb-3(b)(1), unless the authorization is terminated or revoked sooner.  Performed at [facility], [address], [place], [place] [address]   Culture, blood (Routine X 2) w Reflex to ID Panel     Status: None   Collection Time: 06/10/20  7:41 AM   Specimen: BLOOD LEFT HAND  Result Value Ref Range Status   Specimen Description BLOOD LEFT HAND  Final   Special Requests   Final    BOTTLES DRAWN AEROBIC AND ANAEROBIC Blood Culture adequate volume   Culture   Final    NO GROWTH 5 DAYS Performed at [facility], [address] [place], [place] [address]    Report Status 06/15/2020 FINAL  Final  Culture, blood (Routine X 2) w  Reflex to ID Panel  Status: None   Collection Time: 06/10/20  7:52 AM   Specimen: BLOOD RIGHT HAND  Result Value Ref Range Status   Specimen Description BLOOD RIGHT HAND  Final   Special Requests   Final    BOTTLES DRAWN AEROBIC AND ANAEROBIC Blood Culture results may not be optimal due to an inadequate volume of blood received in culture bottles   Culture   Final    NO GROWTH 5 DAYS Performed at [facility], [address] [place], [place] [address]    Report Status 06/15/2020 FINAL  Final         Radiology Studies: No results found.      Scheduled Meds: .  stroke: mapping our early stages of recovery book   Does not apply Once  . amLODipine  10 mg Oral Daily  . Chlorhexidine Gluconate Cloth  6 each Topical Daily  . cloNIDine  0.3 mg Oral TID  . enoxaparin (LOVENOX) injection  40 mg Subcutaneous Q24H  . folic acid  1 mg Oral Daily  . hydrochlorothiazide  25 mg Oral QODAY  . labetalol  200 mg Oral BID  . losartan  50 mg Oral BID  . mouth rinse  15 mL Mouth Rinse BID  . multivitamin with minerals  1 tablet Oral Daily  . pantoprazole  40 mg Oral Daily  . senna-docusate  1 tablet Oral BID  . thiamine injection  100 mg Intravenous Daily   Or  . thiamine  100 mg Oral Daily   Continuous Infusions:    LOS: 6 days    Time spent: 30 minutes    Dorcas Carrow, MD [facility] Pager [phone]

## 2020-06-15 NOTE — Progress Notes (Signed)
STROKE TEAM PROGRESS NOTE   SUBJECTIVE (INTERVAL HISTORY) Patient is sitting up in bed.  He has just finished working with the therapist.  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  Blood pressure adequately controlled.  Await insurance approval for rehab transfer.  OBJECTIVE Temp:  [98.3 F (36.8 C)-99.7 F (37.6 C)] 98.9 F (37.2 C) (05/24 0342) Pulse Rate:  [65-74] 67 (05/24 1159) Cardiac Rhythm: Normal sinus rhythm (05/24 0701) Resp:  [18-24] 20 (05/24 1159) BP: (119-160)/(64-95) 146/84 (05/24 1159) SpO2:  [94 %-99 %] 94 % (05/24 1159)  Recent Labs  Lab 06/09/20 1607  GLUCAP 123*   Recent Labs  Lab 06/10/20 0724 06/10/20 1658 06/11/20 0433 06/11/20 1030 06/11/20 2233 06/12/20 0420 06/12/20 0953 06/13/20 0412 06/14/20 0516  NA 144   < > 142   < > 142 144 141 134* 131*  K 4.3  --  4.1  --   --  3.3*  --  3.6 4.0  CL 114*  --  113*  --   --  108  --  102 96*  CO2 24  --  23  --   --  27  --  26 26  GLUCOSE 163*  --  146*  --   --  111*  --  119* 127*  BUN 16  --  7  --   --  6  --  10 18  CREATININE 1.68*  --  1.19  --   --  1.21  --  1.13 1.18  CALCIUM 8.5*  --  8.5*  --   --  8.6*  --  8.7* 9.0   < > = values in this interval not displayed.   Recent Labs  Lab 06/09/20 1602  AST 40  ALT 38  ALKPHOS 66  BILITOT 0.9  PROT 7.6  ALBUMIN 4.2   Recent Labs  Lab 06/09/20 1602 06/09/20 1608 06/10/20 0724 06/11/20 0433 06/12/20 0420 06/13/20 0412 06/14/20 0516  WBC 9.3  --  8.4 10.9* 6.9 7.5 7.0  NEUTROABS 7.8*  --   --   --   --   --   --   HGB 13.8   < > 12.4* 12.2* 11.1* 11.2* 11.3*  HCT 41.1   < > 37.4* 36.6* 34.8* 33.9* 33.7*  MCV 93.4  --  96.6 97.1 97.2 94.4 93.6  PLT 260  --  270 278 216 235 239   < > = values in this interval not displayed.   No results for input(s): CKTOTAL, CKMB, CKMBINDEX, TROPONINI in the last 168 hours. No results for input(s): LABPROT, INR in the last 72 hours. No results for input(s): COLORURINE,  LABSPEC, PHURINE, GLUCOSEU, HGBUR, BILIRUBINUR, KETONESUR, PROTEINUR, UROBILINOGEN, NITRITE, LEUKOCYTESUR in the last 72 hours.  Invalid input(s): APPERANCEUR     Component Value Date/Time   CHOL 196 06/09/2020 1809   TRIG 153 (H) 06/12/2020 0420   HDL 52 06/09/2020 1809   CHOLHDL 3.8 06/09/2020 1809   VLDL 66 (H) 06/09/2020 1809   LDLCALC 78 06/09/2020 1809   Lab Results  Component Value Date   HGBA1C 5.5 06/09/2020      Component Value Date/Time   LABOPIA NONE DETECTED 06/09/2020 2256   COCAINSCRNUR NONE DETECTED 06/09/2020 2256   LABBENZ NONE DETECTED 06/09/2020 2256   AMPHETMU NONE DETECTED 06/09/2020 2256   THCU NONE DETECTED 06/09/2020 2256   LABBARB NONE DETECTED 06/09/2020 2256    No results for input(s): ETH in the last 168 hours.  I have personally reviewed the radiological images below and agree with the radiology interpretations.  CT HEAD WO CONTRAST  Result Date: 06/11/2020 CLINICAL DATA:  Left arm weakness. Intracranial hemorrhage follow-up EXAM: CT HEAD WITHOUT CONTRAST TECHNIQUE: Contiguous axial images were obtained from the base of the skull through the vertex without intravenous contrast. COMPARISON:  Head CT 06/10/2020 FINDINGS: Brain: Unchanged intraparenchymal hemorrhage centered in the right thalamus with intraventricular extension. Size and configuration of the ventricles is unchanged. Mild leftward midline shift is also unchanged. No new site of hemorrhage. Vascular: No abnormal hyperdensity of the major intracranial arteries or dural venous sinuses. No intracranial atherosclerosis. Skull: The visualized skull base, calvarium and extracranial soft tissues are normal. Sinuses/Orbits: No fluid levels or advanced mucosal thickening of the visualized paranasal sinuses. No mastoid or middle ear effusion. The orbits are normal. IMPRESSION: 1. Unchanged size and configuration of right thalamic intraparenchymal hemorrhage with intraventricular extension. 2. Unchanged  mild leftward midline shift. Electronically Signed   By: Deatra Robinson M.D.   On: 06/11/2020 18:49   CT HEAD WO CONTRAST  Result Date: 06/09/2020 CLINICAL DATA:  Intracranial hemorrhage follow EXAM: CT HEAD WITHOUT CONTRAST TECHNIQUE: Contiguous axial images were obtained from the base of the skull through the vertex without intravenous contrast. COMPARISON:  06/09/2020 at 4:10 p.m. FINDINGS: Brain: Unchanged appearance of intracranial hemorrhage in the right thalamus with extension into the ventricles. 4 mm leftward midline shift is also unchanged. Vascular: No abnormal hyperdensity of the major intracranial arteries or dural venous sinuses. No intracranial atherosclerosis. Skull: The visualized skull base, calvarium and extracranial soft tissues are normal. Sinuses/Orbits: No fluid levels or advanced mucosal thickening of the visualized paranasal sinuses. No mastoid or middle ear effusion. The orbits are normal. IMPRESSION: 1. Unchanged appearance of right thalamic intracranial hemorrhage with extension into the ventricles. 2. Unchanged 4 mm leftward midline shift. Electronically Signed   By: Deatra Robinson M.D.   On: 06/09/2020 22:18   CT SOFT TISSUE NECK WO CONTRAST  Result Date: 06/09/2020 CLINICAL DATA:  Foreign body in the neck. EXAM: CT NECK WITHOUT CONTRAST TECHNIQUE: Multidetector CT imaging of the neck was performed following the standard protocol without intravenous contrast. COMPARISON:  None. FINDINGS: PHARYNX AND LARYNX: The nasopharynx, oropharynx and larynx are normal. Visible portions of the oral cavity, tongue base and floor of mouth are normal. Normal epiglottis, vallecula and pyriform sinuses. The larynx is normal. No retropharyngeal abscess, effusion or lymphadenopathy. SALIVARY GLANDS: Normal parotid, submandibular and sublingual glands. THYROID: Normal. LYMPH NODES: No enlarged or abnormal density lymph nodes. VASCULAR: Major cervical vessels are patent. Left IJ central venous  catheter. LIMITED INTRACRANIAL: Normal. VISUALIZED ORBITS: Normal. MASTOIDS AND VISUALIZED PARANASAL SINUSES: No fluid levels or advanced mucosal thickening. No mastoid effusion. SKELETON: No bony spinal canal stenosis. No lytic or blastic lesions. UPPER CHEST: Clear. OTHER: None. IMPRESSION: Normal CT of the neck. Electronically Signed   By: Deatra Robinson M.D.   On: 06/09/2020 22:22   CT CHEST WO CONTRAST  Result Date: 06/09/2020 CLINICAL DATA:  Mediastinal widening on chest radiograph, possible foreign body EXAM: CT CHEST WITHOUT CONTRAST TECHNIQUE: Multidetector CT imaging of the chest was performed following the standard protocol without IV contrast. COMPARISON:  Radiograph 06/09/2020, 01/02/2018, contemporary neck radiograph FINDINGS: Cardiovascular: Left IJ approach central venous catheter tip terminates near the level of the left brachiocephalic-superior caval junction. Cardiac size is top normal no sizable pericardial effusion. Atherosclerotic plaque within the normal caliber aorta. No hyperdense mural thickening to suggest an intra  mural hematoma. No periaortic stranding or hemorrhage is evident on this unenhanced exam. Central pulmonary arteries are normal caliber. Luminal evaluation of the vasculature is precluded in the absence of contrast media. Mediastinum/Nodes: Some pronounced upper mediastinal widening seen on the chest radiograph is likely a combination of portable technique and abundant mediastinal fat noted on this exam. No mediastinal fluid or gas. Normal thyroid gland and thoracic inlet. No acute abnormality of the trachea or esophagus. No worrisome mediastinal or axillary adenopathy. Hilar nodal evaluation is limited in the absence of intravenous contrast media. Lungs/Pleura: Low lung volumes and atelectasis with more bandlike subsegmental atelectasis towards the lung bases. No effusion. No pneumothorax. Upper Abdomen: No acute abnormalities present in the visualized portions of the upper  abdomen. Musculoskeletal: No acute osseous abnormality or suspicious osseous lesion. Degenerative changes are present in the imaged spine and shoulders. No worrisome chest wall mass. No unexpected radiodense foreign bodies. IMPRESSION: 1. No expected radiopaque foreign body seen in the chest. 2. Left IJ approach central venous catheter tip terminates at the left brachiocephalic-superior caval junction. 3. Mediastinal widening present on comparison chest radiograph is likely a combination artifactual accentuation due to portable technique and abundant mediastinal fat noted on this exam. No worrisome aortic dilatation, or other acute aortic abnormality within the limitations of an unenhanced CT. 4. Basilar atelectatic changes. Electronically Signed   By: Kreg Shropshire M.D.   On: 06/09/2020 23:26   MR ANGIO HEAD WO CONTRAST  Result Date: 06/10/2020 CLINICAL DATA:  Cerebral hemorrhage. EXAM: MRA HEAD WITHOUT CONTRAST TECHNIQUE: Angiographic images of the Circle of Willis were acquired using MRA technique without intravenous contrast. COMPARISON:  No pertinent prior exam. FINDINGS: Motion limited exam.  Within this limitation: Anterior circulation: No large vessel occlusion or evidence of proximal hemodynamically significant stenosis. No evidence of hemodynamically significant proximal stenosis, although evaluation limited by motion. No aneurysm identified. No evidence of vascular malformation in the region of the right thalamic hemorrhage, although acute blood products limit evaluation. There is intrinsic T1 hyperintensity within the hemorrhage, which also limits evaluation. Posterior circulation: No large vessel occlusion or evidence of proximal hemodynamically significant stenosis. Bilateral posterior communicating arteries. No aneurysm. IMPRESSION: 1. No evidence of a vascular malformation in the region of the right thalamic hemorrhage, although acute blood products limit evaluation. 2. No large vessel occlusion  or evidence of proximal hemodynamically significant stenosis on this motion limited exam. Electronically Signed   By: Feliberto Harts MD   On: 06/10/2020 12:02   MR BRAIN W WO CONTRAST  Result Date: 06/10/2020 CLINICAL DATA:  Hemorrhage. EXAM: MRI HEAD WITHOUT AND WITH CONTRAST TECHNIQUE: Multiplanar, multiecho pulse sequences of the brain and surrounding structures were obtained without and with intravenous contrast. CONTRAST:  10mL GADAVIST GADOBUTROL 1 MMOL/ML IV SOLN COMPARISON:  CT Jun 09, 2020. FINDINGS: Mildly motion limited exam. Brain: When comparing across modalities, no substantial change in size/extent of an acute right thalamic hemorrhage, measuring up to approximately 3.8 by 2.4 cm. The hemorrhage demonstrates areas of intrinsic T1 hyperintensity. Similar extraventricular extension with blood products in right greater than left lateral ventricles, third ventricle, and fourth ventricle. No progressive ventriculomegaly. Surrounding edema with similar 4 mm of leftward midline shift. Outside of the hemorrhage itself, and no confluent restricted diffusion to suggest acute infarct. No visible mass lesion, although acute blood products limit evaluation. Moderate patchy T2/FLAIR hyperintensities within the white matter and pons, nonspecific but most likely related to chronic microvascular ischemic disease. Vascular: Further evaluated on concurrent MRA.  Skull and upper cervical spine: Normal marrow signal. Sinuses/Orbits: Clear sinuses.  No acute orbital finding. Other: No mastoid effusions. IMPRESSION: 1. When comparing across modalities, no substantial change in size/extent of an acute right thalamic hemorrhage with intraventricular extension, as detailed above. Similar 4 mm of leftward midline shift. No progressive ventriculomegaly. 2. No specific evidence of acute infarct or visible mass lesion, although acute blood products limits evaluation. A follow-up MRI after resolution of hemorrhage could  further evaluate if clinically indicated. 3. Moderate chronic microvascular ischemic disease. Electronically Signed   By: Feliberto Harts MD   On: 06/10/2020 11:51   DG Chest Portable 1 View  Result Date: 06/09/2020 CLINICAL DATA:  55 year old male with history of central line placement. EXAM: PORTABLE CHEST 1 VIEW COMPARISON:  Chest x-ray 01/02/2018. FINDINGS: Left-sided internal jugular central venous catheter with tip terminating near the junction of the innominate vein with the superior vena cava. Lung volumes are low. No consolidative airspace disease. No pleural effusions. No pneumothorax. No evidence of pulmonary edema. Heart size is mildly enlarged. Marked widening of the mediastinum, new compared to the prior examination. IMPRESSION: 1. Left internal jugular central venous catheter in position, as above, with profound widening of the mediastinum. If there is clinical concern for aortic dissection and/or mediastinal hematoma, further evaluation with chest CTA would be recommended. Electronically Signed   By: Trudie Reed M.D.   On: 06/09/2020 18:11   ECHOCARDIOGRAM COMPLETE  Result Date: 06/10/2020    ECHOCARDIOGRAM REPORT   Patient Name:   [name] Date of Exam: 06/10/2020 Medical Rec #:  [id number]      Height:       70.0 in Accession #:    [id number]     Weight:       251.1 lb Date of Birth:  1965/08/23      BSA:          2.299 m Patient Age:    54 years       BP:           140/71 mmHg Patient Gender: M              HR:           102 bpm. Exam Location:  Inpatient Procedure: 2D Echo, Color Doppler and Cardiac Doppler Indications:    CVA  History:        Patient has prior history of Echocardiogram examinations, most                 recent 08/12/2014. Cardiomyopathy and CHF,                 Signs/Symptoms:Shortness of Breath; Risk Factors:Hypertension,                 Dyslipidemia and Diabetes.  Sonographer:    Neomia Dear RDCS Referring Phys: [id number] ASHISH ARORA  Sonographer Comments:  Patient is morbidly obese and Technically difficult study due to poor echo windows. IMPRESSIONS  1. Left ventricular ejection fraction, by estimation, is 65 to 70%. The left ventricle has normal function. The left ventricle has no regional wall motion abnormalities. There is severe left ventricular hypertrophy. Indeterminate diastolic filling due to E-A fusion.  2. Right ventricular systolic function is normal. The right ventricular size is normal. There is normal pulmonary artery systolic pressure. The estimated right ventricular systolic pressure is 28.4 mmHg.  3. The mitral valve is normal in structure. Trivial mitral valve regurgitation. No evidence of mitral stenosis.  4.  The aortic valve is normal in structure. Aortic valve regurgitation is trivial. No aortic stenosis is present.  5. The inferior vena cava is normal in size with greater than 50% respiratory variability, suggesting right atrial pressure of 3 mmHg.  6. Increased flow velocities may be secondary to anemia, thyrotoxicosis, hyperdynamic or high flow state. Conclusion(s)/Recommendation(s): No intracardiac source of embolism detected on this transthoracic study. A transesophageal echocardiogram is recommended to exclude cardiac source of embolism if clinically indicated. FINDINGS  Left Ventricle: Left ventricular ejection fraction, by estimation, is 65 to 70%. The left ventricle has normal function. The left ventricle has no regional wall motion abnormalities. The left ventricular internal cavity size was normal in size. There is  severe left ventricular hypertrophy. Indeterminate diastolic filling due to E-A fusion. Right Ventricle: The right ventricular size is normal. No increase in right ventricular wall thickness. Right ventricular systolic function is normal. There is normal pulmonary artery systolic pressure. The tricuspid regurgitant velocity is 2.52 m/s, and  with an assumed right atrial pressure of 3 mmHg, the estimated right ventricular  systolic pressure is 28.4 mmHg. Left Atrium: Left atrial size was normal in size. Right Atrium: Right atrial size was normal in size. Pericardium: There is no evidence of pericardial effusion. Mitral Valve: The mitral valve is normal in structure. Trivial mitral valve regurgitation. No evidence of mitral valve stenosis. MV peak gradient, 13.0 mmHg. The mean mitral valve gradient is 4.0 mmHg. Tricuspid Valve: The tricuspid valve is normal in structure. Tricuspid valve regurgitation is trivial. No evidence of tricuspid stenosis. Aortic Valve: The aortic valve is normal in structure. Aortic valve regurgitation is trivial. No aortic stenosis is present. Aortic valve mean gradient measures 7.0 mmHg. Aortic valve peak gradient measures 13.8 mmHg. Aortic valve area, by VTI measures 3.77 cm. Pulmonic Valve: The pulmonic valve was not well visualized. Pulmonic valve regurgitation is trivial. No evidence of pulmonic stenosis. Aorta: The aortic root is normal in size and structure. Venous: The inferior vena cava is normal in size with greater than 50% respiratory variability, suggesting right atrial pressure of 3 mmHg. IAS/Shunts: No atrial level shunt detected by color flow Doppler.  LEFT VENTRICLE PLAX 2D LVIDd:         4.00 cm     Diastology LVIDs:         2.00 cm     LV e' medial:    4.57 cm/s LV PW:         1.70 cm     LV E/e' medial:  16.3 LV IVS:        1.60 cm     LV e' lateral:   7.83 cm/s LVOT diam:     2.30 cm     LV E/e' lateral: 9.5 LV SV:         106 LV SV Index:   46 LVOT Area:     4.15 cm  LV Volumes (MOD) LV vol d, MOD A2C: 78.9 ml LV vol d, MOD A4C: 93.0 ml LV vol s, MOD A2C: 23.9 ml LV vol s, MOD A4C: 22.9 ml LV SV MOD A2C:     55.0 ml LV SV MOD A4C:     93.0 ml LV SV MOD BP:      63.6 ml RIGHT VENTRICLE RV Basal diam:  3.70 cm RV Mid diam:    2.90 cm RV S prime:     25.20 cm/s TAPSE (M-mode): 3.7 cm LEFT ATRIUM  Index       RIGHT ATRIUM           Index LA diam:        3.20 cm 1.39 cm/m  RA  Area:     13.30 cm LA Vol (A2C):   65.6 ml 28.53 ml/m RA Volume:   31.10 ml  13.53 ml/m LA Vol (A4C):   59.8 ml 26.01 ml/m LA Biplane Vol: 63.6 ml 27.66 ml/m  AORTIC VALVE                    PULMONIC VALVE AV Area (Vmax):    3.19 cm     PV Vmax:       1.64 m/s AV Area (Vmean):   3.23 cm     PV Vmean:      113.000 cm/s AV Area (VTI):     3.77 cm     PV VTI:        0.256 m AV Vmax:           186.00 cm/s  PV Peak grad:  10.8 mmHg AV Vmean:          124.000 cm/s PV Mean grad:  6.0 mmHg AV VTI:            0.282 m AV Peak Grad:      13.8 mmHg AV Mean Grad:      7.0 mmHg LVOT Vmax:         143.00 cm/s LVOT Vmean:        96.300 cm/s LVOT VTI:          0.256 m LVOT/AV VTI ratio: 0.91  AORTA Ao Root diam: 3.20 cm Ao Asc diam:  3.20 cm Ao Arch diam: 3.2 cm MITRAL VALVE                TRICUSPID VALVE MV Area (PHT): 4.63 cm     TR Peak grad:   25.4 mmHg MV Area VTI:   3.39 cm     TR Vmax:        252.00 cm/s MV Peak grad:  13.0 mmHg MV Mean grad:  4.0 mmHg     SHUNTS MV Vmax:       1.80 m/s     Systemic VTI:  0.26 m MV Vmean:      94.4 cm/s    Systemic Diam: 2.30 cm MV Decel Time: 164 msec MV E velocity: 74.30 cm/s MV A velocity: 106.00 cm/s MV E/A ratio:  0.70 Dalton Brass MD Electronically signed by Dalton Brass MD Signature Date/Time: 06/10/2020/11:44:08 AM    Final    CT HEAD CODE STROKE WO CONTRAST  Result Date: 06/09/2020 CLINICAL DATA:  Code stroke.  Rightward gaze.  Left arm weakness. EXAM: CT HEAD WITHOUT CONTRAST TECHNIQUE: Contiguous axial images were obtained from the base of the skull through the vertex without intravenous contrast. COMPARISON:  None. FINDINGS: Brain: An acute hemorrhage centered in the right thalamus measures 3.6 x 2.8 x 2.7 cm (approximate volume of 14 mL). There is intraventricular extension with a small amount of hemorrhage in the right greater than left lateral ventricles, third ventricle, and fourth ventricle. The ventricles are not grossly dilated, however prior imaging is  not available to no the patient's baseline ventricle size. There is mild edema surrounding the right thalamic hemorrhage. There is 4 mm of leftward midline shift at the level of the thalami. Hypodensities in the cerebral white matter bilaterally are nonspecific but likely reflect mild chronic small  vessel ischemic disease given the patient's vascular risk factors. No acute cortically based infarct or extra-axial fluid collection is evident. Vascular: No hyperdense vessel. Skull: No fracture or suspicious osseous lesion. Sinuses/Orbits: Minimal mucosal thickening in the right sphenoid sinus. Clear mastoid air cells. Left cataract extraction. Rightward gaze. Other: None. ASPECTS [facility] Stroke Program Early CT Score) Not scored in the presence of acute hemorrhage. IMPRESSION: 1. 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 2. 4 mm of leftward midline shift. 3. Mild chronic small vessel ischemic disease. These results were communicated to Dr. Wilford Corner at 4:17 pm on 06/09/2020 by text page via the [facility] messaging system. Electronically Signed   By: Sebastian Ache M.D.   On: 06/09/2020 16:21   VAS US CAROTID  Result Date: 06/12/2020 Carotid Arterial Duplex Study Patient Name:  Rice Walsh [name]  Date of Exam:   06/10/2020 Medical Rec #: [id number]       Accession #:    [id number] Date of Birth: 1965/07/15       Patient Gender: M Patient Age:   54Y Exam Location:  [facility] Procedure:      VAS US CAROTID Referring Phys: [id number] Marvel Plan --------------------------------------------------------------------------------  Indications:       CVA. Risk Factors:      Hypertension, hyperlipidemia. Limitations        Today's exam was limited due to the body habitus of the                    patient, the patient's respiratory variation, patient                    movement, patient somnolence and a central line. Comparison Study:  No prior studies. Performing Technologist: Chanda Busing RVT  Examination  Guidelines: A complete evaluation includes B-mode imaging, spectral Doppler, color Doppler, and power Doppler as needed of all accessible portions of each vessel. Bilateral testing is considered an integral part of a complete examination. Limited examinations for reoccurring indications may be performed as noted.  Right Carotid Findings: +----------+--------+--------+--------+-----------------------+--------+           PSV cm/sEDV cm/sStenosisPlaque Description     Comments +----------+--------+--------+--------+-----------------------+--------+ CCA Prox  101     12                                              +----------+--------+--------+--------+-----------------------+--------+ CCA Distal82      11              smooth and heterogenous         +----------+--------+--------+--------+-----------------------+--------+ ICA Prox  58      18              smooth and heterogenous         +----------+--------+--------+--------+-----------------------+--------+ ICA Distal57      17                                     tortuous +----------+--------+--------+--------+-----------------------+--------+ ECA       136     19                                              +----------+--------+--------+--------+-----------------------+--------+ +----------+--------+-------+--------+-------------------+  PSV cm/sEDV cmsDescribeArm Pressure (mmHG) +----------+--------+-------+--------+-------------------+ Subclavian120                                        +----------+--------+-------+--------+-------------------+ +---------+--------+--+--------+--+---------+ VertebralPSV cm/s56EDV cm/s13Antegrade +---------+--------+--+--------+--+---------+  Left Carotid Findings: +----------+--------+--------+--------+-----------------------+--------+           PSV cm/sEDV cm/sStenosisPlaque Description     Comments  +----------+--------+--------+--------+-----------------------+--------+ CCA Prox  58      8               smooth and heterogenous         +----------+--------+--------+--------+-----------------------+--------+ CCA Distal69      11              smooth and heterogenous         +----------+--------+--------+--------+-----------------------+--------+ ICA Prox  69      20              smooth and heterogenoustortuous +----------+--------+--------+--------+-----------------------+--------+ ICA Distal112     34                                     tortuous +----------+--------+--------+--------+-----------------------+--------+ ECA       99      14                                              +----------+--------+--------+--------+-----------------------+--------+ +----------+--------+--------+--------+-------------------+           PSV cm/sEDV cm/sDescribeArm Pressure (mmHG) +----------+--------+--------+--------+-------------------+ [id number]                                         +----------+--------+--------+--------+-------------------+ +---------+--------+--+--------+--+---------+ VertebralPSV cm/s41EDV cm/s10Antegrade +---------+--------+--+--------+--+---------+   Summary: Right Carotid: Velocities in the right ICA are consistent with a 1-39% stenosis. Left Carotid: Velocities in the left ICA are consistent with a 1-39% stenosis. Vertebrals: Bilateral vertebral arteries demonstrate antegrade flow. *See table(s) above for measurements and observations.  Electronically signed by Delia HeadyPramod Tyrae Alcoser MD on 06/12/2020 at 2:03:49 PM.    Final    US EKG SITE RITE  Result Date: 06/09/2020 If Site Rite image not attached, placement could not be confirmed due to current cardiac rhythm.   PHYSICAL EXAM  Temp:  [98.3 F (36.8 C)-99.7 F (37.6 C)] 98.9 F (37.2 C) (05/24 0342) Pulse Rate:  [65-74] 67 (05/24 1159) Resp:  [18-24] 20 (05/24 1159) BP:  (119-160)/(64-95) 146/84 (05/24 1159) SpO2:  [94 %-99 %] 94 % (05/24 1159)  General -obese middle-aged African-American male.  In mild respiratory distress. Ophthalmologic - fundi not visualized due to noncooperation.  Cardiovascular - Regular rhythm and rate.  Neuro -awake alert and oriented to place and people, but not to age and time. No aphasia, fluent language, following all simple commands, mild   dysarthria. Able to name and repeat. No gaze palsy, tracking bilaterally, visual field full, PERRL, however with right gaze nystagmus. Left facial droop. Tongue midline. RUE and RLE 5/5, LUE 2/5 and LLE 2-3/5, left sensation loss on the face and LUE and LLE, right FTN intact, gait not tested.     ASSESSMENT/PLAN [name] is a 55 y.o. male with history of hypertension, obesity, cardiomyopathy,  hyperlipidemia, CHF admitted for right-sided gaze, right-sided weakness numbness, fall. No tPA given due to ICH.    ICH:  right thalamic ICH with IVH, likely due to uncontrolled hypertension  CT head showed right thalamic ICH with IVH with 4 mm midline shift  Repeat CT head no hematoma expansion  MRI stable right thalamic ICH with IVH and midline shift  MRA no aneurysm or AVM  CT repeat stable right thalamic hemorrhage with mild intraventricular extension.  2D Echo EF 65-70%  CUS unremarkable  LDL 78  HgbA1c 5.5  UDS negative  SCDs for VTE prophylaxis  No antithrombotic prior to admission, now on No antithrombotic due to ICH  Ongoing aggressive stroke risk factor management  Therapy recommendations: Pending  Disposition: Pending  Cerebral edema  CT and MRI showed midline shift 4mm  CT repeat stable.  On 3% saline @ 75->40 to avoid fluid overload  Na 141-144-147->142  Lasix 40 x 1  Sodium Q6  Hypertensive emergency . Home medication metoprolol 100 twice daily, HCTZ, losartan . Unstable . On Cleviprex, taper off as able . amlodipine 10, metoprolol 100 twice  daily . HCTZ 25, losartan 50 twice daily, clonidine 0.1 3 times daily  Long term BP goal normotensive  Hyperlipidemia  Home meds: Lipitor 40  LDL 78, goal < 70  Now on no statin given recent ICH  Resume statin at discharge  AKI  Cre 0.9->1.68->1.19  On IVF   BMP monitoring  Dysphagia  Speech on board  dys 3 and nectar thick -> dys 2 and thin liquid  Aspiration precaution  Low-grade fever  Tmax 101.2->100.2->100.3  Tachycardia   Blood culture no growth   UA WBC 6-10  COVID PCR neg  CT chest basilar atelectasis  Alcohol abuse  About 24 drinks per week  On CIWA protocol  FA/MVI/B1  Other Stroke Risk Factors  Obesity, Body mass index is 36.03 kg/m.   Likely obstructive sleep apnea, undiagnosed    Other Active Problems    Hospital day # 6    Continue ongoing therapies.  Appreciate medical hospitalist team for resuming his medical care  .  Continue strict blood pressure control.  Transfer to inpatient rehab in the next few days when bed becomes available.  Stroke team will sign off.  Kindly call for questions. Delia Heady, MD Stroke Neurology 06/15/2020 2:02 PM    To contact Stroke Continuity provider, please refer to [email or web address]. After hours, contact General Neurology

## 2020-06-15 NOTE — Plan of Care (Signed)
  Problem: Education: Goal: Knowledge of disease or condition will improve Outcome: Progressing Goal: Knowledge of secondary prevention will improve Outcome: Progressing Goal: Knowledge of patient specific risk factors addressed and post discharge goals established will improve Outcome: Progressing Goal: Individualized Educational Video(s) Outcome: Progressing   Problem: Coping: Goal: Will verbalize positive feelings about self Outcome: Progressing Goal: Will identify appropriate support needs Outcome: Progressing   Problem: Health Behavior/Discharge Planning: Goal: Ability to manage health-related needs will improve Outcome: Progressing   Problem: Self-Care: Goal: Ability to participate in self-care as condition permits will improve Outcome: Progressing Goal: Verbalization of feelings and concerns over difficulty with self-care will improve Outcome: Progressing Goal: Ability to communicate needs accurately will improve Outcome: Progressing   Problem: Nutrition: Goal: Risk of aspiration will decrease Outcome: Progressing Goal: Dietary intake will improve Outcome: Progressing   Problem: Intracerebral Hemorrhage Tissue Perfusion: Goal: Complications of Intracerebral Hemorrhage will be minimized Outcome: Progressing   

## 2020-06-15 NOTE — Progress Notes (Signed)
Physical Therapy Treatment Patient Details Name: [name] MRN: [id number] DOB: March 31, 1965 Today's Date: 06/15/2020    History of Present Illness Pt is a 55 y.o. male who presented 5/18 with L sided weakness, numbness, and gaze. No tPA administered due to ICH. 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 PMH: HTN, obesity, cardiomyopathy, hyperlipidemia, and CHF.    PT Comments    Pt seen with OT to maximize function and safely work on standing activity. In sitting EOB, worked on finding midline beginning with tactile and verbal cues and progressing to more self correction. Pt performed sit to stand in stedy from bed and from flaps of stedy with mod A to L knee and hip for control as well as prevention of abduction LLE. Pt able to engage quad in standing but not enough to prevent L knee buckle. Worked on midline and wt shifting in standing. Pt very motivated throughout session. Continue to recommend CIR. PT will continue to follow.    Follow Up Recommendations  CIR;Supervision/Assistance - 24 hour     Equipment Recommendations  Rolling walker with 5" wheels;3in1 (PT);Wheelchair cushion (measurements PT);Wheelchair (measurements PT)    Recommendations for Other Services Rehab consult     Precautions / Restrictions Precautions Precautions: Fall Precaution Comments: L hemi Restrictions Weight Bearing Restrictions: No    Mobility  Bed Mobility Overal bed mobility: Needs Assistance Bed Mobility: Rolling;Supine to Sit Rolling: Min assist;Min guard   Supine to sit: Max assist;+2 for physical assistance     General bed mobility comments: pt needed min A to roll to R with vc's for grasping LUE with R hand. Min guard to L, cues to protect LUE. Max A +2 for elevation of trunk into sitting and for LLE off EOB (attempted to have pt assist it with RLE)    Transfers Overall transfer level: Needs assistance Equipment used:  Ambulation equipment used Transfers: Sit to/from Stand Sit to Stand: Mod assist;+2 physical assistance   Squat pivot transfers: +2 physical assistance;Total assist     General transfer comment: mod A at L knee and hip for control, pt abducting at L hip with sit>stand. Min A to stand from flaps of stedy. Worked on wt shifting for pregait and maintaining midline with upright posture  Ambulation/Gait             General Gait Details: Unable   Stairs             Wheelchair Mobility    Modified Rankin (Stroke Patients Only) Modified Rankin (Stroke Patients Only) Pre-Morbid Rankin Score: No symptoms Modified Rankin: Severe disability     Balance Overall balance assessment: Needs assistance Sitting-balance support: Feet supported;Single extremity supported Sitting balance-Leahy Scale: Poor Sitting balance - Comments: L lateral lean. With VC, pt able to maintain midline postural control; posture affected by attentional deficits Postural control: Left lateral lean Standing balance support: Bilateral upper extremity supported Standing balance-Leahy Scale: Poor Standing balance comment: L lateral lean                            Cognition Arousal/Alertness: Awake/alert Behavior During Therapy: Impulsive (at times) Overall Cognitive Status: Impaired/Different from baseline Area of Impairment: Attention;Following commands;Safety/judgement;Awareness;Problem solving                   Current Attention Level: Selective Memory: Decreased recall of precautions Following Commands: Follows one step commands with  increased time Safety/Judgement: Decreased awareness of safety;Decreased awareness of deficits Awareness: Emergent Problem Solving: Slow processing;Requires tactile cues General Comments: good carry over of cuing to maintain R lateral weight shift and increase attention to L side. Pt able to verbalize what happened to him      Exercises Total Joint  Exercises Long Arc Quad: AROM;Left;AAROM;10 reps;Seated    General Comments General comments (skin integrity, edema, etc.): pt self correcting with minimal cueing by end of session. Pt with ability to activate L quad minimally in standing.      Pertinent Vitals/Pain Pain Assessment: No/denies pain Faces Pain Scale: No hurt    Home Living                      Prior Function            PT Goals (current goals can now be found in the care plan section) Acute Rehab PT Goals Patient Stated Goal: to get rehab and get better, pt relays frustration at L side not working PT Goal Formulation: With patient/family Time For Goal Achievement: 06/25/20 Potential to Achieve Goals: Good Progress towards PT goals: Progressing toward goals    Frequency    Min 4X/week      PT Plan Current plan remains appropriate    Co-evaluation PT/OT/SLP Co-Evaluation/Treatment: Yes Reason for Co-Treatment: Complexity of the patient's impairments (multi-system involvement);Necessary to address cognition/behavior during functional activity;For patient/therapist safety PT goals addressed during session: Mobility/safety with mobility;Balance;Proper use of DME;Strengthening/ROM OT goals addressed during session: ADL's and self-care;Strengthening/ROM      AM-PAC PT "6 Clicks" Mobility   Outcome Measure  Help needed turning from your back to your side while in a flat bed without using bedrails?: A Lot Help needed moving from lying on your back to sitting on the side of a flat bed without using bedrails?: A Lot Help needed moving to and from a bed to a chair (including a wheelchair)?: Total Help needed standing up from a chair using your arms (e.g., wheelchair or bedside chair)?: Total Help needed to walk in hospital room?: Total Help needed climbing 3-5 steps with a railing? : Total 6 Click Score: 8    End of Session Equipment Utilized During Treatment: Gait belt Activity Tolerance: Patient  tolerated treatment well Patient left: with call bell/phone within reach;in chair;with chair alarm set Nurse Communication: Mobility status PT Visit Diagnosis: Unsteadiness on feet (R26.81);Muscle weakness (generalized) (M62.81);Difficulty in walking, not elsewhere classified (R26.2);Other symptoms and signs involving the nervous system (R29.898);Hemiplegia and hemiparesis Hemiplegia - Right/Left: Left Hemiplegia - dominant/non-dominant: Non-dominant Hemiplegia - caused by: Nontraumatic intracerebral hemorrhage     Time: 1110-1153 PT Time Calculation (min) (ACUTE ONLY): 43 min  Charges:  $Therapeutic Activity: 8-22 mins                     Lyanne Co, PT  Acute Rehab Services  Pager [phone] Office [phone]    Lawana Chambers Khylee Algeo 06/15/2020, 3:13 PM

## 2020-06-15 NOTE — Progress Notes (Signed)
Inpatient Rehabilitation Admissions Coordinator  I met with patient at bedside. We have begun [employer] authorization for  possible CIR admit. I reviewed cost of care if [employer] approves CIR. I await insurance determination for  possible CIR admit.  Danne Baxter, RN, MSN Rehab Admissions Coordinator [phone] 06/15/2020 12:43 PM

## 2020-06-16 DIAGNOSIS — I10 Essential (primary) hypertension: Secondary | ICD-10-CM | POA: Diagnosis not present

## 2020-06-16 MED ORDER — ONDANSETRON HCL 4 MG/2ML IJ SOLN
4.0000 mg | Freq: Four times a day (QID) | INTRAMUSCULAR | Status: DC | PRN
  Administered 2020-06-21: 4 mg via INTRAVENOUS
  Filled 2020-06-16: qty 2

## 2020-06-16 MED ORDER — LORAZEPAM 0.5 MG PO TABS
0.5000 mg | ORAL_TABLET | Freq: Once | ORAL | Status: AC
  Administered 2020-06-16: 0.5 mg via ORAL
  Filled 2020-06-16: qty 1

## 2020-06-16 NOTE — Progress Notes (Signed)
  Inpatient Rehabilitation Admissions Coordinator  I have received approval from [facility] for Cir admit. I am hopeful for a CIR bed in next 24 to 48 hrs.  Ottie Glazier, RN, MSN Rehab Admissions Coordinator [phone] 06/16/2020 6:48 PM

## 2020-06-16 NOTE — Progress Notes (Signed)
PROGRESS NOTE    [name]  SAY:[id number] DOB: 13-Apr-1965 DOA: 06/09/2020 PCP: Pcp, No    Brief Narrative:  55 year old gentleman with history of hypertension, nonischemic dilated cardiomyopathy, hyperlipidemia, chronic diastolic heart failure, noncompliant to medication, ongoing alcohol use presented to the emergency room as code stroke.  Went to bed at 10 PM night before, his wife went to work early morning and did not notice him going to work through her doorbell camera and came home only to find that he was lying in the bathroom with right gaze preference and right-sided weakness.  Called EMS, blood pressure 200-230. In the emergency room, hypertensive.  Stat CT scan showed acute right thalamic hemorrhage with extension to ventricles and 4 mm shift.  Given aggressive antihypertensives, 3% saline and admitted to the neuro ICU. 5/23, transferred out of neuro ICU.  Recommended CIR.  Off antihypertensive drips.  Episodes of confusion and anxiety treated for alcohol withdrawal with Ativan.  Stabilizing.   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 CT head findings, 4 mm midline shift, right thalamic intracranial hemorrhage.  Repeat CT scan with no expansion of hematoma. MRI of the brain, stable right thalamic ICH. MRA of the head, no brain aneurysm. 2D echocardiogram, stable.  Echocardiogram with ejection fraction 65 to 70%. Antiplatelet therapy, contraindicated due to hemorrhage. LDL, 78.  Statins on hold due to acute intracranial hemorrhage. Hemoglobin A1c, 5.5.  No indication for treatment. DVT prophylaxis, SCD.  Back on Lovenox. Therapy recommendations, acute inpatient rehab.  Stabilizing. Patient is off 3% saline.  Sodium is stable. Patient is on  dysphagia 3 diet and tolerating.  Hypertensive emergency: At home on metoprolol 100 mg twice a day, hydrochlorothiazide and losartan.  Patient was treated with Cleviprex drip and tapered off 5/22. Amlodipine 10 mg Losartan 50 mg twice a day Hydrochlorothiazide 25 mg every other day Clonidine 0.1 mg 3 times a day, increase dose to 0.3 mg 3 times a day labetalol 200 mg 2 times a day and uptitrate as needed. Blood pressure stable, however will work further to normalize in the next few days.  Acute kidney injury: Due to hemodynamic changes.  Fairly stabilized.  Alcohol use disorder with alcohol withdrawal: Motivated to quit. Currently remains on symptom-based CIWA scale and multivitamins.  Close monitoring. Currently stable.  Continue to mobilize.  Medically stable to transfer to acute inpatient rehab when bed available.   DVT prophylaxis: enoxaparin (LOVENOX) injection 40 mg Start: 06/12/20 1200   Code Status: Full code Family Communication: None Disposition Plan: Status is: Inpatient  Remains inpatient appropriate because:Unsafe d/c plan   Dispo: The patient is from: Home              Anticipated d/c is to: CIR              Patient currently is medically stable for CIR level of care.   Difficult to place patient No         Consultants:   Neurology  Procedures:   None  Antimicrobials:   None   Subjective: Patient seen and examined.  No overnight events.  Waiting to go to rehab.  Objective: Vitals:   06/15/20 2330 06/16/20 0344 06/16/20 0744 06/16/20 1212  BP: 123/62 118/64 (!) 122/53 (!) 106/56  Pulse: 64 (!) 57 (!) 57 65  Resp: 17 18 20  18  Temp: 98.3 F (36.8 C) 98.4 F (36.9 C) 97.9 F (36.6 C) 98.7 F (37.1 C)  TempSrc: Oral Oral Oral Oral  SpO2: 96% 97% 96% 97%  Weight:      Height:        Intake/Output Summary (Last 24 hours) at 06/16/2020 1238 Last data filed at 06/16/2020 0745 Gross per 24 hour  Intake --  Output 1400 ml  Net -1400 ml    Filed Weights   06/09/20 1626 06/09/20 2000  Weight: 100.2 kg 113.9 kg    Examination:  General exam: Appears calm and comfortable , sound asleep and is snoring when approached. Respiratory system: Clear to auscultation. Respiratory effort normal.  No added sound. Cardiovascular system: S1 & S2 heard, RRR.  Gastrointestinal system: Abdomen is nondistended, soft and nontender. No organomegaly or masses felt. Normal bowel sounds heard. Central nervous system: Alert and oriented.  He has mild dysarthria. Mild Left-sided facial droop Right upper and lower extremity normal sensory and power exam. Left upper extremity weak, 1/5.  Loss of superficial sensation. left lower extremity with motor weakness 2/5.  Loss of superficial sensation.    Data Reviewed: I have personally reviewed following labs and imaging studies  CBC: Recent Labs  Lab 06/09/20 1602 06/09/20 1608 06/10/20 0724 06/11/20 0433 06/12/20 0420 06/13/20 0412 06/14/20 0516  WBC 9.3  --  8.4 10.9* 6.9 7.5 7.0  NEUTROABS 7.8*  --   --   --   --   --   --   HGB 13.8   < > 12.4* 12.2* 11.1* 11.2* 11.3*  HCT 41.1   < > 37.4* 36.6* 34.8* 33.9* 33.7*  MCV 93.4  --  96.6 97.1 97.2 94.4 93.6  PLT 260  --  270 278 216 235 239   < > = values in this interval not displayed.   Basic Metabolic Panel: Recent Labs  Lab 06/10/20 0724 06/10/20 1658 06/11/20 0433 06/11/20 1030 06/11/20 2233 06/12/20 0420 06/12/20 0953 06/13/20 0412 06/14/20 0516  NA 144   < > 142   < > 142 144 141 134* 131*  K 4.3  --  4.1  --   --  3.3*  --  3.6 4.0  CL 114*  --  113*  --   --  108  --  102 96*  CO2 24  --  23  --   --  27  --  26 26  GLUCOSE 163*  --  146*  --   --  111*  --  119* 127*  BUN 16  --  7  --   --  6  --  10 18  CREATININE 1.68*  --  1.19  --   --  1.21  --  1.13 1.18  CALCIUM 8.5*  --  8.5*  --   --  8.6*  --  8.7* 9.0   < > = values in this interval not displayed.   GFR: Estimated Creatinine Clearance: 88.4 mL/min  (by C-G formula based on SCr of 1.18 mg/dL). Liver Function Tests: Recent Labs  Lab 06/09/20 1602  AST 40  ALT 38  ALKPHOS 66  BILITOT 0.9  PROT 7.6  ALBUMIN 4.2   No results for input(s): LIPASE, AMYLASE in the last 168 hours. No results for input(s): AMMONIA in the last 168 hours. Coagulation Profile: Recent Labs  Lab 06/09/20 1602  INR 1.0   Cardiac Enzymes: No results for input(s): CKTOTAL, CKMB, CKMBINDEX, TROPONINI in the last  168 hours. BNP (last 3 results) No results for input(s): PROBNP in the last 8760 hours. HbA1C: No results for input(s): HGBA1C in the last 72 hours. CBG: Recent Labs  Lab 06/09/20 1607  GLUCAP 123*   Lipid Profile: No results for input(s): CHOL, HDL, LDLCALC, TRIG, CHOLHDL, LDLDIRECT in the last 72 hours. Thyroid Function Tests: No results for input(s): TSH, T4TOTAL, FREET4, T3FREE, THYROIDAB in the last 72 hours. Anemia Panel: No results for input(s): VITAMINB12, FOLATE, FERRITIN, TIBC, IRON, RETICCTPCT in the last 72 hours. Sepsis Labs: No results for input(s): PROCALCITON, LATICACIDVEN in the last 168 hours.  Recent Results (from the past 240 hour(s))  MRSA PCR Screening     Status: None   Collection Time: 06/09/20  8:00 PM   Specimen: Nasal Mucosa; Nasopharyngeal  Result Value Ref Range Status   MRSA by PCR NEGATIVE NEGATIVE Final    Comment:        The GeneXpert MRSA Assay (FDA approved for NASAL specimens only), is one component of a comprehensive MRSA colonization surveillance program. It is not intended to diagnose MRSA infection nor to guide or monitor treatment for MRSA infections. Performed at [facility], [address], [place], [place] [address]   SARS CORONAVIRUS 2 (TAT 6-24 HRS)     Status: None   Collection Time: 06/10/20  1:21 AM  Result Value Ref Range Status   SARS Coronavirus 2 NEGATIVE NEGATIVE Final    Comment: (NOTE) SARS-CoV-2 target nucleic acids are NOT DETECTED.  The SARS-CoV-2 RNA is  generally detectable in upper and lower respiratory specimens during the acute phase of infection. Negative results do not preclude SARS-CoV-2 infection, do not rule out co-infections with other pathogens, and should not be used as the sole basis for treatment or other patient management decisions. Negative results must be combined with clinical observations, patient history, and epidemiological information. The expected result is Negative.  Fact Sheet for Patients: HairSlick.no  Fact Sheet for Healthcare Providers: quierodirigir.com  This test is not yet approved or cleared by the [place] FDA and  has been authorized for detection and/or diagnosis of SARS-CoV-2 by FDA under an Emergency Use Authorization (EUA). This EUA will remain  in effect (meaning this test can be used) for the duration of the COVID-19 declaration under Se ction 564(b)(1) of the Act, 21 U.S.C. section 360bbb-3(b)(1), unless the authorization is terminated or revoked sooner.  Performed at [facility], [address], [place], [place] [address]   Culture, blood (Routine X 2) w Reflex to ID Panel     Status: None   Collection Time: 06/10/20  7:41 AM   Specimen: BLOOD LEFT HAND  Result Value Ref Range Status   Specimen Description BLOOD LEFT HAND  Final   Special Requests   Final    BOTTLES DRAWN AEROBIC AND ANAEROBIC Blood Culture adequate volume   Culture   Final    NO GROWTH 5 DAYS Performed at [facility], [address] [place], [place] [address]    Report Status 06/15/2020 FINAL  Final  Culture, blood (Routine X 2) w Reflex to ID Panel     Status: None   Collection Time: 06/10/20  7:52 AM   Specimen: BLOOD RIGHT HAND  Result Value Ref Range Status   Specimen Description BLOOD RIGHT HAND  Final   Special Requests   Final    BOTTLES DRAWN AEROBIC AND ANAEROBIC Blood Culture results may not be optimal due to an inadequate volume  of blood received in culture bottles   Culture   Final    NO GROWTH 5 DAYS Performed at [facility], [address], [place], [place] [address]    Report Status 06/15/2020 FINAL  Final         Radiology Studies: No results found.      Scheduled Meds: .  stroke: mapping our early stages of recovery book   Does not apply Once  . amLODipine  10 mg Oral Daily  . Chlorhexidine Gluconate Cloth  6 each Topical Daily  . cloNIDine  0.3 mg Oral TID  . enoxaparin (LOVENOX) injection  40 mg Subcutaneous Q24H  . folic acid  1 mg Oral Daily  . hydrochlorothiazide  25 mg Oral QODAY  . labetalol  200 mg Oral BID  . losartan  50 mg Oral BID  . mouth rinse  15 mL Mouth Rinse BID  . multivitamin with minerals  1 tablet Oral Daily  . pantoprazole  40 mg Oral Daily  . senna-docusate  1 tablet Oral BID  . thiamine injection  100 mg Intravenous Daily   Or  . thiamine  100 mg Oral Daily   Continuous Infusions:    LOS: 7 days    Time spent: 30 minutes    Dorcas Carrow, MD [facility] Pager [phone]

## 2020-06-16 NOTE — Progress Notes (Signed)
Patient experiencing auditory and visual hallucinations, Q6 CIWA score is 10.   New orders: see MAR.   Toniann Fail, MD paged.

## 2020-06-16 NOTE — Progress Notes (Signed)
  Speech Language Pathology Treatment: Dysphagia  Patient Details Name: [name] MRN: [id number] DOB: Jan 27, 1965 Today's Date: 06/16/2020 Time: 2119-4174 SLP Time Calculation (min) (ACUTE ONLY): 8 min  Assessment / Plan / Recommendation Clinical Impression  Pt sleeping upon SLP arrival for skilled dysphagia treatment and intermittently responded to question prompts in regards to swallow function/diet with max verbal and tactile stimulation cues with damp wash cloth to face. He awakened briefly to report that he utilizes lingual sweep and liquid wash in order to acheive complete oral clearance of solids. He was able to recall that buccal pocketing occurs on L side of his oral cavity. Thin liquids via straw attempted, however lethargy increased despite continued cueing and pt unable to remain alert for safe PO intake. No POs consumed this date. RN reports that pt tolerated dysphagia 3, thin liquid breakfast without difficulty this am. SLP to f/u for continued dysphagia and cognitive treatment.    HPI HPI: [name] is a 55 y.o. male with a PMHx of obesity, HTN, ETOH abuse ( pk/day x 10 yrs), HLD, Cs/dHF who presents after being found down CT revealed acute right thalamic hemorrhage with extension to the ventricles. 21mm shift.      SLP Plan  Continue with current plan of care       Recommendations  Diet recommendations: Dysphagia 3 (mechanical soft);Thin liquid Liquids provided via: Cup;Straw Medication Administration: Whole meds with puree Supervision: Patient able to self feed;Staff to assist with self feeding Compensations: Slow rate;Small sips/bites;Lingual sweep for clearance of pocketing Postural Changes and/or Swallow Maneuvers: Seated upright 90 degrees;Upright 30-60 min after meal                General recommendations: Rehab consult Oral Care Recommendations: Oral care BID Follow up Recommendations: Inpatient Rehab SLP Visit Diagnosis: Dysphagia, unspecified  (R13.10);Cognitive communication deficit (R41.841) Plan: Continue with current plan of care       GO               Avie Echevaria, MA, CCC-SLP Acute Rehabilitation Services Office Number: [phone]  Paulette Blanch 06/16/2020, 11:43 AM

## 2020-06-16 NOTE — Progress Notes (Signed)
Physical Therapy Treatment Patient Details Name: [name] MRN: [id number] DOB: Dec 15, 1965 Today's Date: 06/16/2020    History of Present Illness Pt is a 55 y.o. male who presented 5/18 with L sided weakness, numbness, and gaze. No tPA administered due to ICH. 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 PMH: HTN, obesity, cardiomyopathy, hyperlipidemia, and CHF.    PT Comments    Patient progressing towards physical therapy goals. Patient with improved ability to maintain sitting balance EOB and correct L lateral lean with minimal cueing. Performed sit to stand x 4 with use of STEDY, facilitation at pelvis and hips for glute activation. Worked on dynamic reaching in standing with R UE but required modA to maintain balance due to L lateral lean with no UE support. Continue to recommend comprehensive inpatient rehab (CIR) for post-acute therapy needs.     Follow Up Recommendations  CIR;Supervision/Assistance - 24 hour     Equipment Recommendations  Rolling Caroleann Casler with 5" wheels;3in1 (PT);Wheelchair cushion (measurements PT);Wheelchair (measurements PT)    Recommendations for Other Services       Precautions / Restrictions Precautions Precautions: Fall Precaution Comments: L hemi Restrictions Weight Bearing Restrictions: No    Mobility  Bed Mobility Overal bed mobility: Needs Assistance Bed Mobility: Supine to Sit     Supine to sit: Mod assist;+2 for physical assistance     General bed mobility comments: modA for bringing LEs off bed and trunk elevation. Patient with good core activation. Instructed to use R LE to bring L LE off bed    Transfers Overall transfer level: Needs assistance Equipment used: Ambulation equipment used Transfers: Sit to/from Stand Sit to Stand: Mod assist;+2 physical assistance;+2 safety/equipment         General transfer comment: modA+2 to stand from bed surface and minA+2 for  standing from [facility] flaps. Blocking of L foot to prevent eversion. Sit to stand x 4. Facilitation at hips and buttocks for glute activation for hip extension.  Ambulation/Gait                 Stairs             Wheelchair Mobility    Modified Rankin (Stroke Patients Only) Modified Rankin (Stroke Patients Only) Pre-Morbid Rankin Score: No symptoms Modified Rankin: Severe disability     Balance Overall balance assessment: Needs assistance Sitting-balance support: Feet supported;Single extremity supported Sitting balance-Leahy Scale: Poor Sitting balance - Comments: L lateral lean. With VC, pt able to maintain midline postural control; posture affected by attentional deficits Postural control: Left lateral lean Standing balance support: Bilateral upper extremity supported Standing balance-Leahy Scale: Poor Standing balance comment: L lateral lean                            Cognition Arousal/Alertness: Awake/alert Behavior During Therapy: Impulsive (at times) Overall Cognitive Status: Impaired/Different from baseline Area of Impairment: Attention;Following commands;Safety/judgement;Awareness;Problem solving                   Current Attention Level: Selective Memory: Decreased recall of precautions Following Commands: Follows one step commands with increased time Safety/Judgement: Decreased awareness of safety;Decreased awareness of deficits Awareness: Emergent Problem Solving: Slow processing;Requires tactile cues General Comments: slow processing to cues but overall following simple commands consistently. Good carryover to cueing to maintain midline      Exercises Other Exercises Other Exercises: sit to stand x 4  with STEDY Other Exercises: Standing dynamic reaching with R UE    General Comments        Pertinent Vitals/Pain Pain Assessment: 0-10 Pain Score: 4  Pain Location: head Pain Descriptors / Indicators: Headache Pain  Intervention(s): Monitored during session;Repositioned;Limited activity within patient's tolerance    Home Living                      Prior Function            PT Goals (current goals can now be found in the care plan section) Acute Rehab PT Goals Patient Stated Goal: to get rehab and get better PT Goal Formulation: With patient/family Time For Goal Achievement: 06/25/20 Potential to Achieve Goals: Good Progress towards PT goals: Progressing toward goals    Frequency    Min 4X/week      PT Plan Current plan remains appropriate    Co-evaluation              AM-PAC PT "6 Clicks" Mobility   Outcome Measure  Help needed turning from your back to your side while in a flat bed without using bedrails?: A Lot Help needed moving from lying on your back to sitting on the side of a flat bed without using bedrails?: A Lot Help needed moving to and from a bed to a chair (including a wheelchair)?: Total Help needed standing up from a chair using your arms (e.g., wheelchair or bedside chair)?: Total Help needed to walk in hospital room?: Total Help needed climbing 3-5 steps with a railing? : Total 6 Click Score: 8    End of Session Equipment Utilized During Treatment: Gait belt Activity Tolerance: Patient tolerated treatment well Patient left: with call bell/phone within reach;in chair;with chair alarm set Nurse Communication: Mobility status PT Visit Diagnosis: Unsteadiness on feet (R26.81);Muscle weakness (generalized) (M62.81);Difficulty in walking, not elsewhere classified (R26.2);Other symptoms and signs involving the nervous system (R29.898);Hemiplegia and hemiparesis Hemiplegia - Right/Left: Left Hemiplegia - dominant/non-dominant: Non-dominant Hemiplegia - caused by: Nontraumatic intracerebral hemorrhage     Time: 1431-1455 PT Time Calculation (min) (ACUTE ONLY): 24 min  Charges:  $Neuromuscular Re-education: 23-37 mins                     Christerpher Clos A.  Dan Humphreys PT, DPT Acute Rehabilitation Services Pager [phone] Office [phone]    Viviann Spare 06/16/2020, 4:25 PM

## 2020-06-17 ENCOUNTER — Inpatient Hospital Stay (HOSPITAL_COMMUNITY): Payer: 59

## 2020-06-17 DIAGNOSIS — I10 Essential (primary) hypertension: Secondary | ICD-10-CM | POA: Diagnosis not present

## 2020-06-17 MED ORDER — VALPROATE SODIUM 100 MG/ML IV SOLN
1000.0000 mg | Freq: Once | INTRAVENOUS | Status: AC
  Administered 2020-06-17: 1000 mg via INTRAVENOUS
  Filled 2020-06-17: qty 10

## 2020-06-17 MED ORDER — DIVALPROEX SODIUM 250 MG PO DR TAB
500.0000 mg | DELAYED_RELEASE_TABLET | Freq: Two times a day (BID) | ORAL | Status: DC
  Administered 2020-06-18: 500 mg via ORAL
  Filled 2020-06-17: qty 2

## 2020-06-17 MED ORDER — ACETAMINOPHEN 500 MG PO TABS
1000.0000 mg | ORAL_TABLET | Freq: Four times a day (QID) | ORAL | Status: DC | PRN
  Administered 2020-06-17 – 2020-06-22 (×4): 1000 mg via ORAL
  Filled 2020-06-17 (×4): qty 2

## 2020-06-17 MED ORDER — ALPRAZOLAM 0.5 MG PO TABS
0.5000 mg | ORAL_TABLET | Freq: Three times a day (TID) | ORAL | Status: DC | PRN
  Administered 2020-06-17 – 2020-06-18 (×2): 0.5 mg via ORAL
  Filled 2020-06-17 (×2): qty 1

## 2020-06-17 MED ORDER — HYDROMORPHONE HCL 1 MG/ML IJ SOLN
0.5000 mg | INTRAMUSCULAR | Status: DC | PRN
  Administered 2020-06-17: 0.5 mg via INTRAVENOUS
  Filled 2020-06-17: qty 0.5

## 2020-06-17 MED ORDER — LORAZEPAM 0.5 MG PO TABS
0.5000 mg | ORAL_TABLET | Freq: Once | ORAL | Status: AC
  Administered 2020-06-17: 0.5 mg via ORAL
  Filled 2020-06-17: qty 1

## 2020-06-17 NOTE — Progress Notes (Signed)
Inpatient Rehabilitation Admissions Coordinator  I have insurance approval for CIR but no bed available today. Noted CIWA issues last night and CT scan today. I have contacted his wife by phone and she is aware of CIR approval. Met with patient at bedside and he is complaining of headache. I will follow up tomorrow.  Danne Baxter, RN, MSN Rehab Admissions Coordinator [phone] 06/17/2020 11:04 AM

## 2020-06-17 NOTE — Progress Notes (Signed)
PT Cancellation Note  Patient Details Name: [name] MRN: [id number] DOB: 09/16/1965   Cancelled Treatment:    Reason Eval/Treat Not Completed: Fatigue/lethargy limiting ability to participate Patient with complaints of migraine, fatigue. Patient requested to defer therapy this date. PT will re-attempt as time allows.   Elvin Banker A. Dan Humphreys PT, DPT Acute Rehabilitation Services Pager [phone] Office [phone]    Viviann Spare 06/17/2020, 2:51 PM

## 2020-06-17 NOTE — Progress Notes (Signed)
PROGRESS NOTE    [name]  TOI:[id number] DOB: 07/26/1965 DOA: 06/09/2020 PCP: Pcp, No    Brief Narrative:  55 year old gentleman with history of hypertension, nonischemic dilated cardiomyopathy, hyperlipidemia, chronic diastolic heart failure, noncompliant to medication, ongoing alcohol use presented to the emergency room as code stroke.  Went to bed at 10 PM night before, his wife went to work early morning and did not notice him going to work through her doorbell camera and came home only to find that he was lying in the bathroom with right gaze preference and right-sided weakness.  Called EMS, blood pressure 200-230. In the emergency room, hypertensive.  Stat CT scan showed acute right thalamic hemorrhage with extension to ventricles and 4 mm shift.  Given aggressive antihypertensives, 3% saline and admitted to the neuro ICU. 5/23, transferred out of neuro ICU.  Recommended CIR.  Off antihypertensive drips.  Episodes of confusion and anxiety treated for alcohol withdrawal with Ativan.  Stabilizing. 5/26, continues to complain of a headache.  A repeat CT scan stable and improving.   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 CT head findings, 4 mm midline shift, right thalamic intracranial hemorrhage.  Repeat CT scan with no expansion of hematoma. 5/26, repeat CT scan is stable and improving. MRI of the brain, stable right thalamic ICH. MRA of the head, no brain aneurysm. 2D echocardiogram, stable.  Echocardiogram with ejection fraction 65 to 70%. Antiplatelet therapy, contraindicated due to hemorrhage. LDL, 78.  Statins on hold due to acute intracranial hemorrhage. Hemoglobin A1c, 5.5.  No indication for treatment. DVT prophylaxis, SCD.  Back on  Lovenox. Therapy recommendations, acute inpatient rehab.  Stabilizing. Patient is off 3% saline.  Sodium is stable. Patient is on dysphagia 3 diet and tolerating.  Hypertensive emergency: At home on metoprolol 100 mg twice a day, hydrochlorothiazide and losartan.  Patient was treated with Cleviprex drip and tapered off 5/22. Amlodipine 10 mg Losartan 50 mg twice a day Hydrochlorothiazide 25 mg every other day Clonidine 0.1 mg 3 times a day, increase dose to 0.3 mg 3 times a day labetalol 200 mg 2 times a day and uptitrate as needed. Blood pressure stable, however will work further to normalize in the next few days.  Acute kidney injury: Due to hemodynamic changes.  Fairly stabilized.  Alcohol use disorder with alcohol withdrawal: Motivated to quit.  Not on a scheduled benzodiazepine.  Patient had headache and improved with Xanax and Tylenol.  We will keep low-dose Xanax for the next few days.  Continue to mobilize.  Medically stable to transfer to acute inpatient rehab when bed available.   DVT prophylaxis: enoxaparin (LOVENOX) injection 40 mg Start: 06/12/20 1200   Code Status: Full code Family Communication: None Disposition Plan: Status is: Inpatient  Remains inpatient appropriate because:Unsafe d/c plan   Dispo: The patient is from: Home              Anticipated d/c is to: CIR              Patient currently is medically stable for CIR level of care.   Difficult to place patient No         Consultants:   Neurology  Procedures:   None  Antimicrobials:   None   Subjective: Patient seen and examined.  Apparently received 0.5 mg Ativan overnight  for some hallucinations.  Patient tells me that he is not getting good sleep, too much disturbance as well he is having more headaches since yesterday.  Today he complained of a headache 7/10.  Objective: Vitals:   06/16/20 2337 06/17/20 0332 06/17/20 0737 06/17/20 1106  BP: (!) 142/72 134/72 132/77 137/69  Pulse:  64 70 66 69  Resp: 19 19 20 18   Temp: 98.8 F (37.1 C) 98.6 F (37 C) 98.4 F (36.9 C) 98.2 F (36.8 C)  TempSrc: Oral Oral Oral Oral  SpO2: 95% 96% 92% 98%  Weight:      Height:        Intake/Output Summary (Last 24 hours) at 06/17/2020 1303 Last data filed at 06/17/2020 0739 Gross per 24 hour  Intake --  Output 2500 ml  Net -2500 ml   Filed Weights   06/09/20 1626 06/09/20 2000  Weight: 100.2 kg 113.9 kg    Examination:  General exam: Appears calm but mild distress due to headache.  No neurological changes.  No anxiety or tremors. Respiratory system: Clear to auscultation. Respiratory effort normal.  No added sound. Cardiovascular system: S1 & S2 heard, RRR.  Gastrointestinal system: Abdomen is nondistended, soft and nontender. No organomegaly or masses felt. Normal bowel sounds heard. Central nervous system: Alert and oriented.  He has mild dysarthria. Mild Left-sided facial droop Right upper and lower extremity normal sensory and power exam. Left upper extremity weak, 1/5.  Loss of superficial sensation. left lower extremity with motor weakness 2/5.  Loss of superficial sensation.    Data Reviewed: I have personally reviewed following labs and imaging studies  CBC: Recent Labs  Lab 06/11/20 0433 06/12/20 0420 06/13/20 0412 06/14/20 0516  WBC 10.9* 6.9 7.5 7.0  HGB 12.2* 11.1* 11.2* 11.3*  HCT 36.6* 34.8* 33.9* 33.7*  MCV 97.1 97.2 94.4 93.6  PLT 278 216 235 239   Basic Metabolic Panel: Recent Labs  Lab 06/11/20 0433 06/11/20 1030 06/11/20 2233 06/12/20 0420 06/12/20 0953 06/13/20 0412 06/14/20 0516  NA 142   < > 142 144 141 134* 131*  K 4.1  --   --  3.3*  --  3.6 4.0  CL 113*  --   --  108  --  102 96*  CO2 23  --   --  27  --  26 26  GLUCOSE 146*  --   --  111*  --  119* 127*  BUN 7  --   --  6  --  10 18  CREATININE 1.19  --   --  1.21  --  1.13 1.18  CALCIUM 8.5*  --   --  8.6*  --  8.7* 9.0   < > = values in this interval not displayed.    GFR: Estimated Creatinine Clearance: 88.4 mL/min (by C-G formula based on SCr of 1.18 mg/dL). Liver Function Tests: No results for input(s): AST, ALT, ALKPHOS, BILITOT, PROT, ALBUMIN in the last 168 hours. No results for input(s): LIPASE, AMYLASE in the last 168 hours. No results for input(s): AMMONIA in the last 168 hours. Coagulation Profile: No results for input(s): INR, PROTIME in the last 168 hours. Cardiac Enzymes: No results for input(s): CKTOTAL, CKMB, CKMBINDEX, TROPONINI in the last 168 hours. BNP (last 3 results) No results for input(s): PROBNP in the last 8760 hours. HbA1C: No results for input(s): HGBA1C in the last 72 hours. CBG: No results for input(s): GLUCAP in the last 168 hours. Lipid Profile: No results for  input(s): CHOL, HDL, LDLCALC, TRIG, CHOLHDL, LDLDIRECT in the last 72 hours. Thyroid Function Tests: No results for input(s): TSH, T4TOTAL, FREET4, T3FREE, THYROIDAB in the last 72 hours. Anemia Panel: No results for input(s): VITAMINB12, FOLATE, FERRITIN, TIBC, IRON, RETICCTPCT in the last 72 hours. Sepsis Labs: No results for input(s): PROCALCITON, LATICACIDVEN in the last 168 hours.  Recent Results (from the past 240 hour(s))  MRSA PCR Screening     Status: None   Collection Time: 06/09/20  8:00 PM   Specimen: Nasal Mucosa; Nasopharyngeal  Result Value Ref Range Status   MRSA by PCR NEGATIVE NEGATIVE Final    Comment:        The GeneXpert MRSA Assay (FDA approved for NASAL specimens only), is one component of a comprehensive MRSA colonization surveillance program. It is not intended to diagnose MRSA infection nor to guide or monitor treatment for MRSA infections. Performed at [facility], [address] [place], [place] [address]   SARS CORONAVIRUS 2 (TAT 6-24 HRS)     Status: None   Collection Time: 06/10/20  1:21 AM  Result Value Ref Range Status   SARS Coronavirus 2 NEGATIVE NEGATIVE Final    Comment: (NOTE) SARS-CoV-2 target  nucleic acids are NOT DETECTED.  The SARS-CoV-2 RNA is generally detectable in upper and lower respiratory specimens during the acute phase of infection. Negative results do not preclude SARS-CoV-2 infection, do not rule out co-infections with other pathogens, and should not be used as the sole basis for treatment or other patient management decisions. Negative results must be combined with clinical observations, patient history, and epidemiological information. The expected result is Negative.  Fact Sheet for Patients: HairSlick.no  Fact Sheet for Healthcare Providers: quierodirigir.com  This test is not yet approved or cleared by the [place] FDA and  has been authorized for detection and/or diagnosis of SARS-CoV-2 by FDA under an Emergency Use Authorization (EUA). This EUA will remain  in effect (meaning this test can be used) for the duration of the COVID-19 declaration under Se ction 564(b)(1) of the Act, 21 U.S.C. section 360bbb-3(b)(1), unless the authorization is terminated or revoked sooner.  Performed at [facility], [address], [place], [place] [address]   Culture, blood (Routine X 2) w Reflex to ID Panel     Status: None   Collection Time: 06/10/20  7:41 AM   Specimen: BLOOD LEFT HAND  Result Value Ref Range Status   Specimen Description BLOOD LEFT HAND  Final   Special Requests   Final    BOTTLES DRAWN AEROBIC AND ANAEROBIC Blood Culture adequate volume   Culture   Final    NO GROWTH 5 DAYS Performed at [facility], [address], [place], [place] [address]    Report Status 06/15/2020 FINAL  Final  Culture, blood (Routine X 2) w Reflex to ID Panel     Status: None   Collection Time: 06/10/20  7:52 AM   Specimen: BLOOD RIGHT HAND  Result Value Ref Range Status   Specimen Description BLOOD RIGHT HAND  Final   Special Requests   Final    BOTTLES DRAWN AEROBIC AND ANAEROBIC Blood  Culture results may not be optimal due to an inadequate volume of blood received in culture bottles   Culture   Final    NO GROWTH 5 DAYS Performed at [facility], [address], [place], [place] [address]    Report Status 06/15/2020 FINAL  Final  Radiology Studies: CT HEAD WO CONTRAST  Result Date: 06/17/2020 CLINICAL DATA:  Intracranial hemorrhage EXAM: CT HEAD WITHOUT CONTRAST TECHNIQUE: Contiguous axial images were obtained from the base of the skull through the vertex without intravenous contrast. COMPARISON:  CT head 06/11/2020 FINDINGS: Brain: Right thalamic hemorrhage unchanged in size. Mild intraventricular hemorrhage shows partial clearing. Mild midline shift to the left is unchanged. Mild ventricular dilatation has progressed in the interval. Patchy white matter hypodensity bilaterally most consistent with chronic microvascular ischemia. No acute ischemic infarct. Vascular: Negative for hyperdense vessel Skull: Negative Sinuses/Orbits: Mild mucosal edema paranasal sinuses. Left cataract extraction. Chronic nasal bone fracture unchanged. Other: None IMPRESSION: Right thalamic hematoma stable Partial clearing of intraventricular hemorrhage. Mild ventricular enlargement, with progression since the prior study. Electronically Signed   By: Marlan Palau M.D.   On: 06/17/2020 09:18        Scheduled Meds: .  stroke: mapping our early stages of recovery book   Does not apply Once  . amLODipine  10 mg Oral Daily  . Chlorhexidine Gluconate Cloth  6 each Topical Daily  . cloNIDine  0.3 mg Oral TID  . enoxaparin (LOVENOX) injection  40 mg Subcutaneous Q24H  . folic acid  1 mg Oral Daily  . hydrochlorothiazide  25 mg Oral QODAY  . labetalol  200 mg Oral BID  . losartan  50 mg Oral BID  . mouth rinse  15 mL Mouth Rinse BID  . multivitamin with minerals  1 tablet Oral Daily  . pantoprazole  40 mg Oral Daily  . senna-docusate  1 tablet Oral BID  . thiamine injection   100 mg Intravenous Daily   Or  . thiamine  100 mg Oral Daily   Continuous Infusions:    LOS: 8 days    Time spent: 30 minutes    Dorcas Carrow, MD [facility] Pager [phone]

## 2020-06-18 ENCOUNTER — Inpatient Hospital Stay (HOSPITAL_COMMUNITY): Payer: 59

## 2020-06-18 DIAGNOSIS — I619 Nontraumatic intracerebral hemorrhage, unspecified: Secondary | ICD-10-CM | POA: Diagnosis not present

## 2020-06-18 DIAGNOSIS — I610 Nontraumatic intracerebral hemorrhage in hemisphere, subcortical: Secondary | ICD-10-CM | POA: Diagnosis not present

## 2020-06-18 DIAGNOSIS — I10 Essential (primary) hypertension: Secondary | ICD-10-CM | POA: Diagnosis not present

## 2020-06-18 LAB — AMMONIA: Ammonia: 23 umol/L (ref 9–35)

## 2020-06-18 LAB — COMPREHENSIVE METABOLIC PANEL
ALT: 31 U/L (ref 0–44)
AST: 25 U/L (ref 15–41)
Albumin: 3.7 g/dL (ref 3.5–5.0)
Alkaline Phosphatase: 54 U/L (ref 38–126)
Anion gap: 10 (ref 5–15)
BUN: 38 mg/dL — ABNORMAL HIGH (ref 6–20)
CO2: 23 mmol/L (ref 22–32)
Calcium: 9.5 mg/dL (ref 8.9–10.3)
Chloride: 95 mmol/L — ABNORMAL LOW (ref 98–111)
Creatinine, Ser: 1.92 mg/dL — ABNORMAL HIGH (ref 0.61–1.24)
GFR, Estimated: 40 mL/min — ABNORMAL LOW (ref >60)
Glucose, Bld: 117 mg/dL — ABNORMAL HIGH (ref 70–99)
Potassium: 5.4 mmol/L — ABNORMAL HIGH (ref 3.5–5.1)
Sodium: 128 mmol/L — ABNORMAL LOW (ref 135–145)
Total Bilirubin: 1.1 mg/dL (ref 0.3–1.2)
Total Protein: 7.5 g/dL (ref 6.5–8.1)

## 2020-06-18 LAB — URINALYSIS, ROUTINE W REFLEX MICROSCOPIC
Bilirubin Urine: NEGATIVE
Glucose, UA: NEGATIVE mg/dL
Hgb urine dipstick: NEGATIVE
Ketones, ur: NEGATIVE mg/dL
Leukocytes,Ua: NEGATIVE
Nitrite: NEGATIVE
Protein, ur: NEGATIVE mg/dL
Specific Gravity, Urine: 1.014 (ref 1.005–1.030)
pH: 5 (ref 5.0–8.0)

## 2020-06-18 LAB — BLOOD GAS, ARTERIAL
Acid-base deficit: 1 mmol/L (ref 0.0–2.0)
Bicarbonate: 23.2 mmol/L (ref 20.0–28.0)
Drawn by: 548791
FIO2: 21
O2 Saturation: 91.6 %
Patient temperature: 37
pCO2 arterial: 39 mmHg (ref 32.0–48.0)
pH, Arterial: 7.392 (ref 7.350–7.450)
pO2, Arterial: 65.6 mmHg — ABNORMAL LOW (ref 83.0–108.0)

## 2020-06-18 LAB — CBC WITH DIFFERENTIAL/PLATELET
Abs Immature Granulocytes: 0.14 10*3/uL — ABNORMAL HIGH (ref 0.00–0.07)
Basophils Absolute: 0 10*3/uL (ref 0.0–0.1)
Basophils Relative: 0 %
Eosinophils Absolute: 0 10*3/uL (ref 0.0–0.5)
Eosinophils Relative: 0 %
HCT: 37.9 % — ABNORMAL LOW (ref 39.0–52.0)
Hemoglobin: 12.8 g/dL — ABNORMAL LOW (ref 13.0–17.0)
Immature Granulocytes: 2 %
Lymphocytes Relative: 10 %
Lymphs Abs: 0.9 10*3/uL (ref 0.7–4.0)
MCH: 31 pg (ref 26.0–34.0)
MCHC: 33.8 g/dL (ref 30.0–36.0)
MCV: 91.8 fL (ref 80.0–100.0)
Monocytes Absolute: 0.7 10*3/uL (ref 0.1–1.0)
Monocytes Relative: 8 %
Neutro Abs: 7.3 10*3/uL (ref 1.7–7.7)
Neutrophils Relative %: 80 %
Platelets: 488 10*3/uL — ABNORMAL HIGH (ref 150–400)
RBC: 4.13 MIL/uL — ABNORMAL LOW (ref 4.22–5.81)
RDW: 11.6 % (ref 11.5–15.5)
WBC: 9 10*3/uL (ref 4.0–10.5)
nRBC: 0 % (ref 0.0–0.2)

## 2020-06-18 LAB — GLUCOSE, CAPILLARY: Glucose-Capillary: 148 mg/dL — ABNORMAL HIGH (ref 70–99)

## 2020-06-18 LAB — VALPROIC ACID LEVEL: Valproic Acid Lvl: 45 ug/mL — ABNORMAL LOW (ref 50.0–100.0)

## 2020-06-18 LAB — MAGNESIUM: Magnesium: 2.6 mg/dL — ABNORMAL HIGH (ref 1.7–2.4)

## 2020-06-18 LAB — PHOSPHORUS: Phosphorus: 5.6 mg/dL — ABNORMAL HIGH (ref 2.5–4.6)

## 2020-06-18 MED ORDER — LABETALOL HCL 100 MG PO TABS
100.0000 mg | ORAL_TABLET | Freq: Two times a day (BID) | ORAL | Status: DC
  Administered 2020-06-19 – 2020-06-22 (×7): 100 mg via ORAL
  Filled 2020-06-18 (×7): qty 1

## 2020-06-18 MED ORDER — CLONIDINE HCL 0.2 MG PO TABS
0.2000 mg | ORAL_TABLET | Freq: Three times a day (TID) | ORAL | Status: DC
  Administered 2020-06-19: 0.2 mg via ORAL
  Filled 2020-06-18: qty 1

## 2020-06-18 MED ORDER — SODIUM CHLORIDE 0.9 % IV SOLN: INTRAVENOUS | Status: DC

## 2020-06-18 NOTE — Procedures (Signed)
Patient Name: [name]  MRN: [id number]  Epilepsy Attending: Charlsie Quest  Referring Physician/Provider: Dr Delia Heady Date: 06/18/2020 Duration: 21.18 mins  Patient history:  55yo M with acute neurological change in his level of consciousness with leftward head and eye deviation. EEG to evaluate seizure activity.  Level of alertness: Awake  AEDs during EEG study: VPA  Technical aspects: This EEG study was done with scalp electrodes positioned according to the 10-20 International system of electrode placement. Electrical activity was acquired at a sampling rate of 500Hz  and reviewed with a high frequency filter of 70Hz  and a low frequency filter of 1Hz . EEG data were recorded continuously and digitally stored.   Description: No posterior dominant rhythm was seen.  EEG showed continuous generalized and lateralized right hemisphere 3 to 6 Hz theta-delta slowing admixed with generalized 15-18Hz  beta activity. Hyperventilation and photic stimulation were not performed.     ABNORMALITY - Continuous slow, generalized  and lateralized right hemisphere  IMPRESSION: This study is  suggestive of suggestive of cortical dysfunction arising from right hemisphere likely secondary to underlying bleed as well as moderate diffuse encephalopathy, nonspecific etiology. No seizures or definite epileptiform discharges were seen throughout the recording.  Dr was notified.   Ailie Gage 

## 2020-06-18 NOTE — Progress Notes (Signed)
I have seen the patient and he continues to be severely encephalopathic, but he will awaken and follow simple commands. Stable since earlier exam, but not improved. EEG is pending and he will be transferred to the ICU with CCM consult.   Ritta Slot, MD [facility] [phone]  If 7pm- 7am, please page neurology on call as listed in AMION.

## 2020-06-18 NOTE — Progress Notes (Addendum)
Attended patient with change in mental status.  Apparently patient work with physical therapy and was helped to the bedside chair.  Speech therapy walked into find that patient was unresponsive to verbal and painful stimuli.  I was called to bedside, when I examined the patient he had normal vital sign, blood sugar was adequate.  Patient was not responding spontaneously as well as painful stimuli.  He was staring blank, had left gaze deviation, unaware of the surroundings.  Not following commands but flaccid left side as in the morning and normal tone on the right side. No obvious trauma, seizures or incontinence. Stabilized.  Moved to the bed.  Discussed with neurology.  Plan: Likely suffered from seizure episode or valproic acid toxicity. Discussed with neurology at the bedside. Electrolytes, blood gas, valproic acid level and ammonia level. Blood sugars are adequate. Blood pressure is too aggressively reduced, will decrease doses of clonidine and labetalol. Seizure precautions and fall precautions. CT scan of the head to be done now.   Neurology will follow up.   Follow up examination with Dr Amada Jupiter. Patient is still obtunded and difficult to arouse. CT head is stable , no evidence of new bleeding.  DD still remains metabolic causes , seizure episode ?  Discussed with Critical care , requesting transfer to Neuro ICU. Maintaining airway now and vitals are stable.  Transferred care to neuro ICU.

## 2020-06-18 NOTE — Progress Notes (Signed)
ABG drawn x1 attempt. Sample sent to lab. Lab notified at 1737. Pt with SpO2 89-90% on room air, pt placed on 3L nasal cannula. RN notified. RT will continue to monitor and be available as needed.

## 2020-06-18 NOTE — Progress Notes (Signed)
Stat  EEG complete - results pending.  

## 2020-06-18 NOTE — Progress Notes (Signed)
SLP Cancellation Note  Patient Details Name: [name] MRN: [id number] DOB: 1965/08/07   Cancelled treatment:        Attempted to arouse for dysphagia session. Opened eyes with tactile stimulation and gaze straight, no eye movement, no response to painful stimuli. RN summoned to room and male RN able to elicit slight reaction from pt. Noted that pt has had medication that has decreased his level of alertness today. Continue efforts   Royce Macadamia 06/18/2020, 4:30 PM

## 2020-06-18 NOTE — Progress Notes (Signed)
PROGRESS NOTE    Hobart Marte III  KDT:[id number] DOB: Jun 15, 1965 DOA: 06/09/2020 PCP: Pcp, No    Brief Narrative:  55 year old gentleman with history of hypertension, nonischemic dilated cardiomyopathy, hyperlipidemia, chronic diastolic heart failure, noncompliant to medication, ongoing alcohol use presented to the emergency room as code stroke.  Went to bed at 10 PM night before, his wife went to work early morning and did not notice him going to work through her doorbell camera and came home only to find that he was lying in the bathroom with right gaze preference and right-sided weakness.  Called EMS, blood pressure 200-230. In the emergency room, hypertensive.  Stat CT scan showed acute right thalamic hemorrhage with extension to ventricles and 4 mm shift.  Given aggressive antihypertensives, 3% saline and admitted to the neuro ICU. 5/23, transferred out of neuro ICU.  Recommended CIR.  Off antihypertensive drips.  Episodes of confusion and anxiety treated for alcohol withdrawal with Ativan.  Stabilizing. 5/26, continues to complain of a headache.  A repeat CT scan stable and improving.  Started on Depakote.   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 CT head findings, 4 mm midline shift, right thalamic intracranial hemorrhage.  Repeat CT scan with no expansion of hematoma. 5/26, repeat CT scan is stable and improving. MRI of the brain, stable right thalamic ICH. MRA of the head, no brain aneurysm. 2D echocardiogram, stable.  Echocardiogram with ejection fraction 65 to 70%. Antiplatelet therapy, contraindicated due to hemorrhage. LDL, 78.  Statins on hold due to acute intracranial hemorrhage. Hemoglobin A1c, 5.5.  No indication for treatment. DVT  prophylaxis, SCD.  Back on Lovenox. Therapy recommendations, acute inpatient rehab.  Stabilizing. Patient is off 3% saline.  Sodium is stable. Patient is on dysphagia 3 diet and tolerating.  Hypertensive emergency: At home on metoprolol 100 mg twice a day, hydrochlorothiazide and losartan.  Patient was treated with Cleviprex drip and tapered off 5/22. Amlodipine 10 mg Losartan 50 mg twice a day Hydrochlorothiazide 25 mg every other day Clonidine 0.1 mg 3 times a day, increase dose to 0.3 mg 3 times a day labetalol 200 mg 2 times a day and uptitrate as needed. Blood pressure tight control today.  Acute kidney injury: Due to hemodynamic changes.  Fairly stabilized.  We will recheck tomorrow.  Alcohol use disorder with alcohol withdrawal: Motivated to quit.  Using occasional Xanax and trazodone at night.  Too sleepy to work with therapies.  Will discontinue.  Migraine headache/posterior stroke headache: Patient continues to have severe hemicranial headache.  Repeat CT scan is stable.  Discussed with neurology.  Recommended to load with Depakote and start prophylactic Depakote.  Will continue on discharge.  Continue to mobilize.  Medically stable to transfer to acute inpatient rehab when bed available.   DVT prophylaxis: enoxaparin (LOVENOX) injection 40 mg Start: 06/12/20 1200   Code Status: Full code Family Communication: Wife on the phone. Disposition Plan: Status is: Inpatient  Remains inpatient appropriate because:Unsafe d/c plan   Dispo: The patient is from: Home              Anticipated d/c is to: CIR              Patient currently is medically stable for CIR level of care.   Difficult to place patient No  Consultants:   Neurology  Procedures:   None  Antimicrobials:   None   Subjective: Patient seen and examined.  Most of the headache gone.  He feels better today however sleepy after Ativan and trazodone at night.  No family at the  bedside.  Objective: Vitals:   06/17/20 2000 06/18/20 0000 06/18/20 0400 06/18/20 0800  BP: (!) 108/44 (!) 127/59 132/77 117/66  Pulse: 64 63 68 66  Resp: 16 16 14    Temp: 98.4 F (36.9 C) 98.6 F (37 C) 98 F (36.7 C)   TempSrc: Oral Oral Oral   SpO2: 95% 95% 96% 96%  Weight:      Height:        Intake/Output Summary (Last 24 hours) at 06/18/2020 1147 Last data filed at 06/18/2020 0500 Gross per 24 hour  Intake --  Output 2350 ml  Net -2350 ml   Filed Weights   06/09/20 1626 06/09/20 2000  Weight: 100.2 kg 113.9 kg    Examination:  General exam: Calm and comfortable.  Chronically sick looking but not in any distress.  Mostly sleepy.  Unaware that breakfast is served. Respiratory system: Clear to auscultation. Respiratory effort normal.  No added sound. Cardiovascular system: S1 & S2 heard, RRR.  Gastrointestinal system: Abdomen is nondistended, soft and nontender. No organomegaly or masses felt. Normal bowel sounds heard. Central nervous system: Alert and oriented.  He has mild dysarthria. Mild Left-sided facial droop Right upper and lower extremity normal sensory and power exam. Left upper extremity weak, 1/5.  Loss of superficial sensation. left lower extremity with motor weakness 2/5.  Loss of superficial sensation.    Data Reviewed: I have personally reviewed following labs and imaging studies  CBC: Recent Labs  Lab 06/12/20 0420 06/13/20 0412 06/14/20 0516  WBC 6.9 7.5 7.0  HGB 11.1* 11.2* 11.3*  HCT 34.8* 33.9* 33.7*  MCV 97.2 94.4 93.6  PLT 216 235 239   Basic Metabolic Panel: Recent Labs  Lab 06/11/20 2233 06/12/20 0420 06/12/20 0953 06/13/20 0412 06/14/20 0516  NA 142 144 141 134* 131*  K  --  3.3*  --  3.6 4.0  CL  --  108  --  102 96*  CO2  --  27  --  26 26  GLUCOSE  --  111*  --  119* 127*  BUN  --  6  --  10 18  CREATININE  --  1.21  --  1.13 1.18  CALCIUM  --  8.6*  --  8.7* 9.0   GFR: Estimated Creatinine Clearance: 88.4  mL/min (by C-G formula based on SCr of 1.18 mg/dL). Liver Function Tests: No results for input(s): AST, ALT, ALKPHOS, BILITOT, PROT, ALBUMIN in the last 168 hours. No results for input(s): LIPASE, AMYLASE in the last 168 hours. No results for input(s): AMMONIA in the last 168 hours. Coagulation Profile: No results for input(s): INR, PROTIME in the last 168 hours. Cardiac Enzymes: No results for input(s): CKTOTAL, CKMB, CKMBINDEX, TROPONINI in the last 168 hours. BNP (last 3 results) No results for input(s): PROBNP in the last 8760 hours. HbA1C: No results for input(s): HGBA1C in the last 72 hours. CBG: No results for input(s): GLUCAP in the last 168 hours. Lipid Profile: No results for input(s): CHOL, HDL, LDLCALC, TRIG, CHOLHDL, LDLDIRECT in the last 72 hours. Thyroid Function Tests: No results for input(s): TSH, T4TOTAL, FREET4, T3FREE, THYROIDAB in the last 72 hours. Anemia Panel: No results for input(s): VITAMINB12, FOLATE, FERRITIN, TIBC, IRON, RETICCTPCT  in the last 72 hours. Sepsis Labs: No results for input(s): PROCALCITON, LATICACIDVEN in the last 168 hours.  Recent Results (from the past 240 hour(s))  MRSA PCR Screening     Status: None   Collection Time: 06/09/20  8:00 PM   Specimen: Nasal Mucosa; Nasopharyngeal  Result Value Ref Range Status   MRSA by PCR NEGATIVE NEGATIVE Final    Comment:        The GeneXpert MRSA Assay (FDA approved for NASAL specimens only), is one component of a comprehensive MRSA colonization surveillance program. It is not intended to diagnose MRSA infection nor to guide or monitor treatment for MRSA infections. Performed at [facility], [address] [place], [place] [address]   SARS CORONAVIRUS 2 (TAT 6-24 HRS)     Status: None   Collection Time: 06/10/20  1:21 AM  Result Value Ref Range Status   SARS Coronavirus 2 NEGATIVE NEGATIVE Final    Comment: (NOTE) SARS-CoV-2 target nucleic acids are NOT DETECTED.  The SARS-CoV-2  RNA is generally detectable in upper and lower respiratory specimens during the acute phase of infection. Negative results do not preclude SARS-CoV-2 infection, do not rule out co-infections with other pathogens, and should not be used as the sole basis for treatment or other patient management decisions. Negative results must be combined with clinical observations, patient history, and epidemiological information. The expected result is Negative.  Fact Sheet for Patients: HairSlick.no  Fact Sheet for Healthcare Providers: quierodirigir.com  This test is not yet approved or cleared by the [place] FDA and  has been authorized for detection and/or diagnosis of SARS-CoV-2 by FDA under an Emergency Use Authorization (EUA). This EUA will remain  in effect (meaning this test can be used) for the duration of the COVID-19 declaration under Se ction 564(b)(1) of the Act, 21 U.S.C. section 360bbb-3(b)(1), unless the authorization is terminated or revoked sooner.  Performed at [facility], [address] [place], [place] [address]   Culture, blood (Routine X 2) w Reflex to ID Panel     Status: None   Collection Time: 06/10/20  7:41 AM   Specimen: BLOOD LEFT HAND  Result Value Ref Range Status   Specimen Description BLOOD LEFT HAND  Final   Special Requests   Final    BOTTLES DRAWN AEROBIC AND ANAEROBIC Blood Culture adequate volume   Culture   Final    NO GROWTH 5 DAYS Performed at [facility], [address] [place], [place] [address]    Report Status 06/15/2020 FINAL  Final  Culture, blood (Routine X 2) w Reflex to ID Panel     Status: None   Collection Time: 06/10/20  7:52 AM   Specimen: BLOOD RIGHT HAND  Result Value Ref Range Status   Specimen Description BLOOD RIGHT HAND  Final   Special Requests   Final    BOTTLES DRAWN AEROBIC AND ANAEROBIC Blood Culture results may not be optimal due to an inadequate  volume of blood received in culture bottles   Culture   Final    NO GROWTH 5 DAYS Performed at [facility], [address], [place], [place] [address]    Report Status 06/15/2020 FINAL  Final         Radiology Studies: CT HEAD WO CONTRAST  Result Date: 06/17/2020 CLINICAL DATA:  Intracranial hemorrhage EXAM: CT HEAD WITHOUT CONTRAST TECHNIQUE: Contiguous axial images were obtained from the base of the skull through the vertex  without intravenous contrast. COMPARISON:  CT head 06/11/2020 FINDINGS: Brain: Right thalamic hemorrhage unchanged in size. Mild intraventricular hemorrhage shows partial clearing. Mild midline shift to the left is unchanged. Mild ventricular dilatation has progressed in the interval. Patchy white matter hypodensity bilaterally most consistent with chronic microvascular ischemia. No acute ischemic infarct. Vascular: Negative for hyperdense vessel Skull: Negative Sinuses/Orbits: Mild mucosal edema paranasal sinuses. Left cataract extraction. Chronic nasal bone fracture unchanged. Other: None IMPRESSION: Right thalamic hematoma stable Partial clearing of intraventricular hemorrhage. Mild ventricular enlargement, with progression since the prior study. Electronically Signed   By: Marlan Palau M.D.   On: 06/17/2020 09:18        Scheduled Meds: .  stroke: mapping our early stages of recovery book   Does not apply Once  . amLODipine  10 mg Oral Daily  . Chlorhexidine Gluconate Cloth  6 each Topical Daily  . cloNIDine  0.3 mg Oral TID  . divalproex  500 mg Oral Q12H  . enoxaparin (LOVENOX) injection  40 mg Subcutaneous Q24H  . folic acid  1 mg Oral Daily  . hydrochlorothiazide  25 mg Oral QODAY  . labetalol  200 mg Oral BID  . losartan  50 mg Oral BID  . mouth rinse  15 mL Mouth Rinse BID  . multivitamin with minerals  1 tablet Oral Daily  . pantoprazole  40 mg Oral Daily  . senna-docusate  1 tablet Oral BID  . thiamine injection  100 mg Intravenous Daily    Or  . thiamine  100 mg Oral Daily   Continuous Infusions:    LOS: 9 days    Time spent: 30 minutes    Dorcas Carrow, MD [facility] Pager [phone]

## 2020-06-18 NOTE — Progress Notes (Signed)
Occupational Therapy Treatment Patient Details Name: [name] MRN: [id number] DOB: [birth year] Today's Date: 06/18/2020    History of present illness Pt is a 55 y.o. male who presented 5/18 with L sided weakness, numbness, and gaze. No tPA administered due to ICH. 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 PMH: HTN, obesity, cardiomyopathy, hyperlipidemia, and CHF.   OT comments  Pt progressing towards established OT goals. Upon arrival, pt sleeping heavily while supine in bed (suspect due to medication). Pt with increased arousal once at EOB and upright. Pt performing oral care at EOB with Min A for bilateral coordination and maintaining grasp at L hand. Pt performing sit<>stand with stedy and Min A +2. Pt participating in balance activity at sink involving crossing midline, weight bearing through LUE, visual scanning, and postural changes. Pt requiring Min Guard-Min A and Min-Mod cues for maintaining posture while sitting at sink on stedy. Continue to highly recommend dc to CIR for intensive OT and will continue to follow acutely as admitted.    Follow Up Recommendations  CIR    Equipment Recommendations  3 in 1 bedside commode;Wheelchair (measurements OT);Wheelchair cushion (measurements OT);Hospital bed    Recommendations for Other Services Rehab consult    Precautions / Restrictions Precautions Precautions: Fall Precaution Comments: L hemi       Mobility Bed Mobility Overal bed mobility: Needs Assistance Bed Mobility: Supine to Sit     Supine to sit: Max assist;+2 for physical assistance     General bed mobility comments: Max A to bring BLEs towards EOB and then elevate trunk.    Transfers Overall transfer level: Needs assistance Equipment used: Ambulation equipment used Transfers: Sit to/from Stand Sit to Stand: +2 physical assistance;+2 safety/equipment;Min assist         General transfer comment:  Min A +2 for power up with use of stedy    Balance Overall balance assessment: Needs assistance Sitting-balance support: Feet supported;Single extremity supported Sitting balance-Leahy Scale: Poor Sitting balance - Comments: L lateral lean. With VC, pt able to maintain midline postural control; posture affected by attentional deficits   Standing balance support: Bilateral upper extremity supported;During functional activity Standing balance-Leahy Scale: Poor                             ADL either performed or assessed with clinical judgement   ADL Overall ADL's : Needs assistance/impaired     Grooming: Minimal assistance;Sitting;Maximal assistance Grooming Details (indicate cue type and reason): Pt requiring assistance to maintain grasp on tooth paste with L hand. Also poor sitting balance when performing oral care and needing Max A for support at EOB                 Toilet Transfer: Minimal assistance;+2 for physical assistance;Total assistance (sit<>stand wit stedy and then transfer to recliner) Toilet Transfer Details (indicate cue type and reason): Min A +2 for sit<>stand with stedy adn then transfer to recliner         Functional mobility during ADLs: +2 for physical assistance;Minimal assistance (sit <> stand) General ADL Comments: Highly motivated. Decreased strength, balance, attention to L, and cognition     Vision   Vision Assessment?: Yes Eye Alignment: Impaired (comment) (dysconjugate gaze) Ocular Range of Motion: Within Functional Limits Alignment/Gaze Preference: Within Defined Limits Tracking/Visual Pursuits: Decreased smoothness of horizontal tracking;Other (comment) (difficulty performing horizontal tracking) Saccades: Additional head turns  occurred during testing;Additional eye shifts occurred during testing;Decreased speed of saccadic movement;Impaired - to be further tested in functional context Convergence: Within functional limits Visual  Fields: Impaired-to be further tested in functional context;Left visual field deficit (Pt not attending to objects in L field adn requires VC to locate items) Diplopia Assessment:  (denies diplopia) Depth Perception: Overshoots   Perception     Praxis      Cognition Arousal/Alertness: Lethargic;Suspect due to medications;Awake/alert (Initially lethargic. Once upright very motivated) Behavior During Therapy: WFL for tasks assessed/performed Overall Cognitive Status: Impaired/Different from baseline Area of Impairment: Attention;Following commands;Safety/judgement;Awareness;Problem solving                   Current Attention Level: Selective   Following Commands: Follows one step commands with increased time;Follows multi-step commands inconsistently Safety/Judgement: Decreased awareness of safety;Decreased awareness of deficits Awareness: Intellectual;Emergent Problem Solving: Slow processing;Requires tactile cues General Comments: Initially lethargic (suspect due to medication) and more awake once at EOB and upright. Once awake, pt very motivated and eager to participate in therapy. Pt with difficulty maintaining balance while performing oral care and requiring support. Needing increased time and cues.        Exercises Exercises: Other exercises Other Exercises Other Exercises: While sitting on stedy at sink, pt maintaining upright posture with use of mirror and Min-Mod cues. Facilitating weight bearing through LUE. Pt reaching to grab 3 objects in left visual field and then reaching outside of base of support to R and giving object to therapist. performed x3   Shoulder Instructions       General Comments      Pertinent Vitals/ Pain       Pain Assessment: Faces Faces Pain Scale: No hurt Pain Intervention(s): Monitored during session  Home Living                                          Prior Functioning/Environment              Frequency  Min  2X/week        Progress Toward Goals  OT Goals(current goals can now be found in the care plan section)  Progress towards OT goals: Progressing toward goals  Acute Rehab OT Goals Patient Stated Goal: to get rehab and get better OT Goal Formulation: With patient/family (spoke with wife over the phone) Time For Goal Achievement: 06/26/20 Potential to Achieve Goals: Good ADL Goals Pt Will Perform Grooming: with set-up;sitting Pt Will Transfer to Toilet: with mod assist;squat pivot transfer;bedside commode Pt/caregiver will Perform Home Exercise Program: Increased strength;Left upper extremity;With written HEP provided;With minimal assist Additional ADL Goal #1: Pt will perform higher level cognitive task with <3 errors in 3/5 trials. Additional ADL Goal #2: Pt will perform x10 mins of OOB ADL tasks with minimal cues for attending to task.  Plan Discharge plan remains appropriate    Co-evaluation    PT/OT/SLP Co-Evaluation/Treatment: Yes Reason for Co-Treatment: Complexity of the patient's impairments (multi-system involvement);For patient/therapist safety;To address functional/ADL transfers   OT goals addressed during session: ADL's and self-care      AM-PAC OT "6 Clicks" Daily Activity     Outcome Measure   Help from another person eating meals?: A Little Help from another person taking care of personal grooming?: A Little Help from another person toileting, which includes using toliet, bedpan, or urinal?: Total Help from another person bathing (  including washing, rinsing, drying)?: A Lot Help from another person to put on and taking off regular upper body clothing?: A Lot Help from another person to put on and taking off regular lower body clothing?: Total 6 Click Score: 12    End of Session Equipment Utilized During Treatment: Gait belt  OT Visit Diagnosis: Unsteadiness on feet (R26.81);Muscle weakness (generalized) (M62.81);Pain;Other symptoms and signs involving  cognitive function;Other symptoms and signs involving the nervous system (R29.898);Hemiplegia and hemiparesis Hemiplegia - Right/Left: Left Hemiplegia - dominant/non-dominant: Non-Dominant Hemiplegia - caused by: Nontraumatic intracerebral hemorrhage Pain - part of body:  (head)   Activity Tolerance Patient tolerated treatment well   Patient Left in chair;with call bell/phone within reach;with chair alarm set   Nurse Communication Mobility status;Need for lift equipment        Time: 5956-3875 OT Time Calculation (min): 32 min  Charges: OT General Charges $OT Visit: 1 Visit OT Treatments $Self Care/Home Management : 8-22 mins  Israa Caban MSOT, OTR/L Acute Rehab Pager: [phone] Office: [phone]   Theodoro Grist Raffaela Ladley 06/18/2020, 1:31 PM

## 2020-06-18 NOTE — Consult Note (Signed)
NAME:  [name], MRN:  [id number], DOB:  12/30/1965, LOS: 9 ADMISSION DATE:  06/09/2020, CONSULTATION DATE:  06/18/20 REFERRING MD:  Leonel Ramsay - neuro, CHIEF COMPLAINT:  AMS   History of Present Illness:  55 yo M PMH non-ischemic dilated cardiomyopathy, HLD,  diastolic heart failure, HTN, obesity admitted 06/09/20 as code stroke -- found lying in bathroom with R gaze preference R wkness, and numbness. SBPs 200-230 on presentation. Found to have cerebellar stroke (R thalamic bleed extending into ventricles with 102m shift){, admitted to neuro. During this course, has rcvd 3%, and hx course fairly unremarkable. On 5/27 pt had worsening encephalopathy and lethargy -- had received VPA for headache but no other sedating meds on this day. Prior days has received some ativan. CT h 5/27 similar sz R thalamic hemorrhage without significant change from prior study.  PCCM consulted for evaluation in this setting.   Notable labs at time of consultation   5/27 ABG- 7.3/39/65     BMP Na 131 K 5.4 Cl 95 CO2 23 Glu 117 BUN 38 Cr 1.92 AG 10 Phos 5.6 Cr 2.6  Pertinent  Medical History  Obesity diastolic HF NICM  HTN HLD EtOH use   Significant Hospital Events: Including procedures, antibiotic start and stop dates in addition to other pertinent events   . 5/18 admitted for R thalamic bleed. 3% initiated . 5/23 transferred out of neuro ICU and recommended for CIR. EtOH withdrawal managed with Ativan  . 5/26 started on VPA for HA  . 5/27 CCM consulted for worsening encephalopathy and concern for airway protection.   Interim History / Subjective:  Fluctuating mental status with somnolence, confusion, and lucidity   Objective   Blood pressure 133/64, pulse 68, temperature 98.4 F (36.9 C), temperature source Oral, resp. rate 18, height '5\' 10"'  (1.778 m), weight 113.9 kg, SpO2 92 %.        Intake/Output Summary (Last 24 hours) at 06/18/2020 1905 Last data filed at 06/18/2020 0500 Gross per 24 hour   Intake --  Output 1350 ml  Net -1350 ml   Filed Weights   06/09/20 1626 06/09/20 2000  Weight: 100.2 kg 113.9 kg    Examination: General: Ill appearing middle aged M reclined in bed NAD  HENT: NCAT pink mmm with thin clear oral secretions. Anicteric sclera  Lungs: Symmetrical chest expansion, even and unlabored, no rhonchi Cardiovascular: rrr s1s2 cap refill brisk  Abdomen: Soft ndnt + bowel sounds Extremities: No acute joint deformity no cyanosis or clubbing Neuro: awakens to tactile stimuli. Lethargic. Groaning, unable to say words. Following R sided commands, protecting airway. Groaning to painful stimuli BUE BLE  GU: defer   Labs/imaging that I havepersonally reviewed  (right click and "Reselect all SmartList Selections" daily)  CT h 5/27 similar sz R thalamic hemorrhage without significant change from prior study.  Notable labs at time of consultation   5/27 ABG- 7.3/39/65    BMP Na 131 K 5.4 Cl 95 CO2 23 Glu 117 BUN 38 Cr 1.92 AG 10 Phos 5.6 Cr 2.6. CBC - WBC 9 plt 488  VPA level - 45  LFTs- Alk phos 54 AST 25 ALT 31 Ammonia 23   5/23 -- BMP: Na 131 Glu 127 BUN 18 Cr 1.18 CBC: WBC 9 plt 239    Resolved Hospital Problem list   Hypertensive emergency    Assessment & Plan:   Acute encephalopathy, likely multifactorial -doesn't sound like intracranial process has yielded many of these sx to date, imaging  is stable -- think this may very well be delirium (ICU stay, critical illness, poor sleep) and medication side effects (had rcvd ativan for etoh withdrawal but should be fairly well metabolized by now, did get VPA for HA 5/27)  -protecting airway currently -VPA level not high, ammonia WNL. Mildly elevated BUN (38) Na 128 but doubt this accounts for mental status change P -transferring to ICU for close monitoring -minimize CNS depressing meds -delirium precautions -EEG pending   HA/migraine P -on VPA  Acute R thalamic ICH with IVH, 67m shift  P -per neuro   -is back on vte ppx   HTN P -amlodine, losartan, clonidine, labetalol  -discontinuing HCTZ 5/27 in setting of below   AKI Cr 1.18 on 5/23 to 1.92 on 5/27 P -trend renal indices, UOP  -IVF (NS given hypoNa below)  Hyperkalemia -AKI above  Hyponatremia -likely in setting of HCTZ  P -hctz discontinued. Trend Na  - will monitor K -- is only very mildly elevated   EtOH use disorder P -cont multivitamins    Best practice (right click and "Reselect all SmartList Selections" daily)  Diet:  NPO Pain/Anxiety/Delirium protocol (if indicated): No VAP protocol (if indicated): Not indicated DVT prophylaxis: LMWH GI prophylaxis: N/A and PPI Glucose control:  SSI No Central venous access:  N/A Arterial line:  N/A Foley:  N/A Mobility:  bed rest  PT consulted: Yes Last date of multidisciplinary goals of care discussion [per primary] Code Status:  full code Disposition: transferring to ICU   Labs   CBC: Recent Labs  Lab 06/12/20 0420 06/13/20 0412 06/14/20 0516  WBC 6.9 7.5 7.0  HGB 11.1* 11.2* 11.3*  HCT 34.8* 33.9* 33.7*  MCV 97.2 94.4 93.6  PLT 216 235 2161   Basic Metabolic Panel: Recent Labs  Lab 06/11/20 2233 06/12/20 0420 06/12/20 0953 06/13/20 0412 06/14/20 0516  NA 142 144 141 134* 131*  K  --  3.3*  --  3.6 4.0  CL  --  108  --  102 96*  CO2  --  27  --  26 26  GLUCOSE  --  111*  --  119* 127*  BUN  --  6  --  10 18  CREATININE  --  1.21  --  1.13 1.18  CALCIUM  --  8.6*  --  8.7* 9.0   GFR: Estimated Creatinine Clearance: 88.4 mL/min (by C-G formula based on SCr of 1.18 mg/dL). Recent Labs  Lab 06/12/20 0420 06/13/20 0412 06/14/20 0516  WBC 6.9 7.5 7.0    Liver Function Tests: No results for input(s): AST, ALT, ALKPHOS, BILITOT, PROT, ALBUMIN in the last 168 hours. No results for input(s): LIPASE, AMYLASE in the last 168 hours. No results for input(s): AMMONIA in the last 168 hours.  ABG    Component Value Date/Time   PHART 7.392  06/18/2020 1730   PCO2ART 39.0 06/18/2020 1730   PO2ART 65.6 (L) 06/18/2020 1730   HCO3 23.2 06/18/2020 1730   TCO2 26 06/09/2020 1608   ACIDBASEDEF 1.0 06/18/2020 1730   O2SAT 91.6 06/18/2020 1730     Coagulation Profile: No results for input(s): INR, PROTIME in the last 168 hours.  Cardiac Enzymes: No results for input(s): CKTOTAL, CKMB, CKMBINDEX, TROPONINI in the last 168 hours.  HbA1C: Hgb A1c MFr Bld  Date/Time Value Ref Range Status  06/09/2020 06:09 PM 5.5 4.8 - 5.6 % Final    Comment:    (NOTE) Pre diabetes:  5.7%-6.4%  Diabetes:              >6.4%  Glycemic control for   <7.0% adults with diabetes   06/27/2016 03:54 PM 5.4 4.6 - 6.5 % Final    Comment:    Glycemic Control Guidelines for People with Diabetes:Non Diabetic:  <6%Goal of Therapy: <7%Additional Action Suggested:  >8%     CBG: Recent Labs  Lab 06/18/20 1615  GLUCAP 148*    Review of Systems:   Unable to obtain due to encephalopathy  Past Medical History:  He,  has a past medical history of Chronic combined systolic and diastolic CHF (congestive heart failure) ([place]), Diastolic dysfunction, Dyslipidemia, Hyperlipidemia, Hypertension, Nonischemic dilated cardiomyopathy ([place]), and Shortness of breath.   Surgical History:   Past Surgical History:  Procedure Laterality Date  . KNEE ARTHROSCOPY Left 04/13/2016   Procedure: ARTHROSCOPY KNEE WITH DEBRIDEMENT;  Surgeon: Garald Balding, MD;  Location: [facility];  Service: Orthopedics;  Laterality: Left;  . KNEE ARTHROSCOPY WITH MEDIAL MENISECTOMY Right 04/13/2016   Procedure: KNEE ARTHROSCOPY WITH MEDIAL MENISECTOMY;  Surgeon: Garald Balding, MD;  Location: [facility];  Service: Orthopedics;  Laterality: Right;  . NASAL FRACTURE SURGERY       Social History:   reports that he has never smoked. He has never used smokeless tobacco. He reports current alcohol use of about 24.0 standard drinks of alcohol per  week. He reports that he does not use drugs.   Family History:  His family history includes CAD in his mother; Heart attack in his maternal grandmother and mother; Hypertension in his father and mother.   Allergies No Known Allergies   Home Medications  Prior to Admission medications   Medication Sig Start Date End Date Taking? Authorizing Provider  atorvastatin (LIPITOR) 40 MG tablet Take 1 tablet (40 mg total) by mouth daily at 6 PM. 12/22/19  Yes Lamptey, Myrene Galas, MD  hydrochlorothiazide (HYDRODIURIL) 12.5 MG tablet Take 1 tablet (12.5 mg total) by mouth every other day. 12/22/19  Yes Lamptey, Myrene Galas, MD  ibuprofen (ADVIL) 200 MG tablet Take 800 mg by mouth every 6 (six) hours as needed for moderate pain.   Yes [provider]  metoprolol tartrate (LOPRESSOR) 100 MG tablet Take 1 tablet (100 mg total) by mouth 2 (two) times daily. 12/22/19  Yes Lamptey, Myrene Galas, MD  Multiple Vitamin (MULTIVITAMIN WITH MINERALS) TABS tablet Take 1 tablet by mouth daily.   Yes [provider]  losartan (COZAAR) 50 MG tablet Take 1 tablet (50 mg total) by mouth daily. 12/22/19   Lamptey, Myrene Galas, MD  predniSONE (DELTASONE) 10 MG tablet 6 tabs po day 1, 5 tabs po day 2, 4 tabs po day 3, 3 tabs po day 4, 2 tabs po day 5, 1 tab po day 6 Patient not taking: No sig reported 05/24/20   Dene Gentry, MD     Critical care time: 48 minutes      CRITICAL CARE Performed by: Cristal Generous   Total critical care time: 48 minutes  Critical care time was exclusive of separately billable procedures and treating other patients. Critical care was necessary to treat or prevent imminent or life-threatening deterioration.  Critical care was time spent personally by me on the following activities: development of treatment plan with patient and/or surrogate as well as nursing, discussions with consultants, evaluation of patient's response to treatment, examination of patient, obtaining history  from patient or surrogate, ordering  and performing treatments and interventions, ordering and review of laboratory studies, ordering and review of radiographic studies, pulse oximetry and re-evaluation of patient's condition.  Eliseo Gum MSN, AGACNP-BC [place] for pager details  06/18/2020, 8:05 PM

## 2020-06-18 NOTE — Progress Notes (Addendum)
Called by SP to evaluate patient. Patient was unresponsive to verbal and slight response to painful stimuli and sternal rub. Dr. Jerral Ralph at bedside assessing patient.

## 2020-06-18 NOTE — Progress Notes (Signed)
Physical Therapy Treatment Patient Details Name: [name] MRN: [id number] DOB: [birth year] Today's Date: 06/18/2020    History of Present Illness Pt is a 55 y.o. male who presented 5/18 with L sided weakness, numbness, and gaze. No tPA administered due to ICH. 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 PMH: HTN, obesity, cardiomyopathy, hyperlipidemia, and CHF.    PT Comments    Patient initially lethargic (suspect due to medications) upon arrival but increased arousal once sitting EOB. Patient required min-modA to maintain sitting balance with patient attempting to self correct but unable to complete due to weakness. Patient performed sit to stand with STEDY and minA+2. Patient with improved sitting balance on Stedy, with cueing for upright posture at times. Mirror in room used to assist with finding and maintaining midline. Continue to recommend comprehensive inpatient rehab (CIR) for post-acute therapy needs.     Follow Up Recommendations  CIR;Supervision/Assistance - 24 hour     Equipment Recommendations  Rolling Sonika Levins with 5" wheels;3in1 (PT);Wheelchair cushion (measurements PT);Wheelchair (measurements PT)    Recommendations for Other Services       Precautions / Restrictions Precautions Precautions: Fall Precaution Comments: L hemi Restrictions Weight Bearing Restrictions: No    Mobility  Bed Mobility Overal bed mobility: Needs Assistance Bed Mobility: Supine to Sit     Supine to sit: Max assist;+2 for physical assistance     General bed mobility comments: Max A to bring BLEs towards EOB and then elevate trunk.    Transfers Overall transfer level: Needs assistance Equipment used: Ambulation equipment used Transfers: Sit to/from Stand Sit to Stand: Min assist;+2 physical assistance;+2 safety/equipment         General transfer comment: Min A +2 for power up with use of  stedy  Ambulation/Gait             General Gait Details: Unable   Stairs             Wheelchair Mobility    Modified Rankin (Stroke Patients Only) Modified Rankin (Stroke Patients Only) Pre-Morbid Rankin Score: No symptoms Modified Rankin: Severe disability     Balance Overall balance assessment: Needs assistance Sitting-balance support: Feet supported;Single extremity supported Sitting balance-Leahy Scale: Poor Sitting balance - Comments: L lateral lean. With VC, pt able to maintain midline postural control; posture affected by attentional deficits   Standing balance support: Bilateral upper extremity supported;During functional activity Standing balance-Leahy Scale: Poor                              Cognition Arousal/Alertness: Lethargic;Suspect due to medications;Awake/alert (initially lethargic. Once upright very motivated) Behavior During Therapy: WFL for tasks assessed/performed Overall Cognitive Status: Impaired/Different from baseline Area of Impairment: Attention;Following commands;Safety/judgement;Awareness;Problem solving                   Current Attention Level: Selective   Following Commands: Follows one step commands with increased time;Follows multi-step commands inconsistently Safety/Judgement: Decreased awareness of safety;Decreased awareness of deficits Awareness: Intellectual;Emergent Problem Solving: Slow processing;Requires tactile cues General Comments: Initially lethargic (suspect due to medication) and more awake once at EOB and upright. Once awake, pt very motivated and eager to participate in therapy. Pt with difficulty maintaining balance while performing oral care and requiring support. Needing increased time and cues.      Exercises Other Exercises Other Exercises: While sitting on stedy at sink, pt maintaining upright  posture with use of mirror and Min-Mod cues. Facilitating weight bearing through LUE. Pt  reaching to grab 3 objects in left visual field and then reaching outside of base of support to R and giving object to therapist. performed x3    General Comments        Pertinent Vitals/Pain Pain Assessment: Faces Faces Pain Scale: No hurt Pain Intervention(s): Monitored during session    Home Living                      Prior Function            PT Goals (current goals can now be found in the care plan section) Acute Rehab PT Goals Patient Stated Goal: to get rehab and get better PT Goal Formulation: With patient/family Time For Goal Achievement: 06/25/20 Potential to Achieve Goals: Good Progress towards PT goals: Progressing toward goals    Frequency    Min 4X/week      PT Plan Current plan remains appropriate    Co-evaluation PT/OT/SLP Co-Evaluation/Treatment: Yes Reason for Co-Treatment: Complexity of the patient's impairments (multi-system involvement);For patient/therapist safety;To address functional/ADL transfers PT goals addressed during session: Mobility/safety with mobility;Balance;Proper use of DME OT goals addressed during session: ADL's and self-care      AM-PAC PT "6 Clicks" Mobility   Outcome Measure  Help needed turning from your back to your side while in a flat bed without using bedrails?: A Lot Help needed moving from lying on your back to sitting on the side of a flat bed without using bedrails?: A Lot Help needed moving to and from a bed to a chair (including a wheelchair)?: Total Help needed standing up from a chair using your arms (e.g., wheelchair or bedside chair)?: Total Help needed to walk in hospital room?: Total Help needed climbing 3-5 steps with a railing? 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 Dan Humphreys PT, DPT Acute Rehabilitation Services Pager [phone] Office [phone]    Viviann Spare 06/18/2020, 2:44 PM

## 2020-06-18 NOTE — Progress Notes (Signed)
  Transfer order to 2H24 Report given to [facility] RN VSS, O2 sats stable on 2LNC Pt alert, intermittent somnolence Easily arousable to verbal stimuli Following commands Pt denies any headaches/pain UA collected and sent  NPO status, IVF infusing @50ml /hr EEG monitoring set up and initiated Aspiration/seizure precautions in place Patient belongings at bedside Pt's wife updated Pt currently resting comfortably, no distress noted Pt transferred 2H24 accompanied by RN

## 2020-06-18 NOTE — Progress Notes (Signed)
STROKE TEAM PROGRESS NOTE   SUBJECTIVE (INTERVAL HISTORY) Called by Dr. Jerral Ralph to evaluate this patient urgently due to altered mental status.  Patient apparently had just finished working with his therapist and when Dr. Jerral Ralph walked in he found patient is sitting up in bed.  But unresponsive to verbal or tactile stimuli with some neck jerking to the left with left gaze deviation.  No tonic-clonic extremity movements noted.  On arrival patient was lethargic but able to respond to sternal rub mumbles a few words iDVD alone and was purposefully moving his right arm and leg but remained plegic on the left.  He had left gaze deviation but no jerking movements.    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  Vital signs are stable.  Stat CT scan of the head and EEG as well as valproic acid level and ammonia level were ordered. CT head yesterday had shown stable appearance of right thalamic hematoma with partial clearing of the intraventricular hemorrhage with mild ventricular enlargement which had progressed since the prior study.  OBJECTIVE Temp:  [98 F (36.7 C)-98.6 F (37 C)] 98.4 F (36.9 C) (05/27 1605) Pulse Rate:  [63-70] 68 (05/27 1614) Cardiac Rhythm: Normal sinus rhythm (05/27 0912) Resp:  [14-18] 18 (05/27 1605) BP: (91-133)/(44-77) 133/64 (05/27 1614) SpO2:  [95 %-97 %] 95 % (05/27 1605)  Recent Labs  Lab 06/18/20 1615  GLUCAP 148*   Recent Labs  Lab 06/11/20 2233 06/12/20 0420 06/12/20 0953 06/13/20 0412 06/14/20 0516  NA 142 144 141 134* 131*  K  --  3.3*  --  3.6 4.0  CL  --  108  --  102 96*  CO2  --  27  --  26 26  GLUCOSE  --  111*  --  119* 127*  BUN  --  6  --  10 18  CREATININE  --  1.21  --  1.13 1.18  CALCIUM  --  8.6*  --  8.7* 9.0   No results for input(s): AST, ALT, ALKPHOS, BILITOT, PROT, ALBUMIN in the last 168 hours. Recent Labs  Lab 06/12/20 0420 06/13/20 0412 06/14/20 0516  WBC 6.9 7.5 7.0  HGB 11.1* 11.2* 11.3*  HCT 34.8*  33.9* 33.7*  MCV 97.2 94.4 93.6  PLT 216 235 239   No results for input(s): CKTOTAL, CKMB, CKMBINDEX, TROPONINI in the last 168 hours. No results for input(s): LABPROT, INR in the last 72 hours. No results for input(s): COLORURINE, LABSPEC, PHURINE, GLUCOSEU, HGBUR, BILIRUBINUR, KETONESUR, PROTEINUR, UROBILINOGEN, NITRITE, LEUKOCYTESUR in the last 72 hours.  Invalid input(s): APPERANCEUR     Component Value Date/Time   CHOL 196 06/09/2020 1809   TRIG 153 (H) 06/12/2020 0420   HDL 52 06/09/2020 1809   CHOLHDL 3.8 06/09/2020 1809   VLDL 66 (H) 06/09/2020 1809   LDLCALC 78 06/09/2020 1809   Lab Results  Component Value Date   HGBA1C 5.5 06/09/2020      Component Value Date/Time   LABOPIA NONE DETECTED 06/09/2020 2256   COCAINSCRNUR NONE DETECTED 06/09/2020 2256   LABBENZ NONE DETECTED 06/09/2020 2256   AMPHETMU NONE DETECTED 06/09/2020 2256   THCU NONE DETECTED 06/09/2020 2256   LABBARB NONE DETECTED 06/09/2020 2256    No results for input(s): ETH in the last 168 hours.  I have personally reviewed the radiological images below and agree with the radiology interpretations.  CT HEAD WO CONTRAST  Result Date: 06/17/2020 CLINICAL DATA:  Intracranial hemorrhage EXAM: CT HEAD WITHOUT  CONTRAST TECHNIQUE: Contiguous axial images were obtained from the base of the skull through the vertex without intravenous contrast. COMPARISON:  CT head 06/11/2020 FINDINGS: Brain: Right thalamic hemorrhage unchanged in size. Mild intraventricular hemorrhage shows partial clearing. Mild midline shift to the left is unchanged. Mild ventricular dilatation has progressed in the interval. Patchy white matter hypodensity bilaterally most consistent with chronic microvascular ischemia. No acute ischemic infarct. Vascular: Negative for hyperdense vessel Skull: Negative Sinuses/Orbits: Mild mucosal edema paranasal sinuses. Left cataract extraction. Chronic nasal bone fracture unchanged. Other: None IMPRESSION:  Right thalamic hematoma stable Partial clearing of intraventricular hemorrhage. Mild ventricular enlargement, with progression since the prior study. Electronically Signed   By: Marlan Palau M.D.   On: 06/17/2020 09:18   CT HEAD WO CONTRAST  Result Date: 06/11/2020 CLINICAL DATA:  Left arm weakness. Intracranial hemorrhage follow-up EXAM: CT HEAD WITHOUT CONTRAST TECHNIQUE: Contiguous axial images were obtained from the base of the skull through the vertex without intravenous contrast. COMPARISON:  Head CT 06/10/2020 FINDINGS: Brain: Unchanged intraparenchymal hemorrhage centered in the right thalamus with intraventricular extension. Size and configuration of the ventricles is unchanged. Mild leftward midline shift is also unchanged. No new site of hemorrhage. Vascular: No abnormal hyperdensity of the major intracranial arteries or dural venous sinuses. No intracranial atherosclerosis. Skull: The visualized skull base, calvarium and extracranial soft tissues are normal. Sinuses/Orbits: No fluid levels or advanced mucosal thickening of the visualized paranasal sinuses. No mastoid or middle ear effusion. The orbits are normal. IMPRESSION: 1. Unchanged size and configuration of right thalamic intraparenchymal hemorrhage with intraventricular extension. 2. Unchanged mild leftward midline shift. Electronically Signed   By: Deatra Robinson M.D.   On: 06/11/2020 18:49   CT HEAD WO CONTRAST  Result Date: 06/09/2020 CLINICAL DATA:  Intracranial hemorrhage follow EXAM: CT HEAD WITHOUT CONTRAST TECHNIQUE: Contiguous axial images were obtained from the base of the skull through the vertex without intravenous contrast. COMPARISON:  06/09/2020 at 4:10 p.m. FINDINGS: Brain: Unchanged appearance of intracranial hemorrhage in the right thalamus with extension into the ventricles. 4 mm leftward midline shift is also unchanged. Vascular: No abnormal hyperdensity of the major intracranial arteries or dural venous sinuses. No  intracranial atherosclerosis. Skull: The visualized skull base, calvarium and extracranial soft tissues are normal. Sinuses/Orbits: No fluid levels or advanced mucosal thickening of the visualized paranasal sinuses. No mastoid or middle ear effusion. The orbits are normal. IMPRESSION: 1. Unchanged appearance of right thalamic intracranial hemorrhage with extension into the ventricles. 2. Unchanged 4 mm leftward midline shift. Electronically Signed   By: Deatra Robinson M.D.   On: 06/09/2020 22:18   CT SOFT TISSUE NECK WO CONTRAST  Result Date: 06/09/2020 CLINICAL DATA:  Foreign body in the neck. EXAM: CT NECK WITHOUT CONTRAST TECHNIQUE: Multidetector CT imaging of the neck was performed following the standard protocol without intravenous contrast. COMPARISON:  None. FINDINGS: PHARYNX AND LARYNX: The nasopharynx, oropharynx and larynx are normal. Visible portions of the oral cavity, tongue base and floor of mouth are normal. Normal epiglottis, vallecula and pyriform sinuses. The larynx is normal. No retropharyngeal abscess, effusion or lymphadenopathy. SALIVARY GLANDS: Normal parotid, submandibular and sublingual glands. THYROID: Normal. LYMPH NODES: No enlarged or abnormal density lymph nodes. VASCULAR: Major cervical vessels are patent. Left IJ central venous catheter. LIMITED INTRACRANIAL: Normal. VISUALIZED ORBITS: Normal. MASTOIDS AND VISUALIZED PARANASAL SINUSES: No fluid levels or advanced mucosal thickening. No mastoid effusion. SKELETON: No bony spinal canal stenosis. No lytic or blastic lesions. UPPER CHEST: Clear. OTHER: None. IMPRESSION:  Normal CT of the neck. Electronically Signed   By: Deatra Robinson M.D.   On: 06/09/2020 22:22   CT CHEST WO CONTRAST  Result Date: 06/09/2020 CLINICAL DATA:  Mediastinal widening on chest radiograph, possible foreign body EXAM: CT CHEST WITHOUT CONTRAST TECHNIQUE: Multidetector CT imaging of the chest was performed following the standard protocol without IV contrast.  COMPARISON:  Radiograph 06/09/2020, 01/02/2018, contemporary neck radiograph FINDINGS: Cardiovascular: Left IJ approach central venous catheter tip terminates near the level of the left brachiocephalic-superior caval junction. Cardiac size is top normal no sizable pericardial effusion. Atherosclerotic plaque within the normal caliber aorta. No hyperdense mural thickening to suggest an intra mural hematoma. No periaortic stranding or hemorrhage is evident on this unenhanced exam. Central pulmonary arteries are normal caliber. Luminal evaluation of the vasculature is precluded in the absence of contrast media. Mediastinum/Nodes: Some pronounced upper mediastinal widening seen on the chest radiograph is likely a combination of portable technique and abundant mediastinal fat noted on this exam. No mediastinal fluid or gas. Normal thyroid gland and thoracic inlet. No acute abnormality of the trachea or esophagus. No worrisome mediastinal or axillary adenopathy. Hilar nodal evaluation is limited in the absence of intravenous contrast media. Lungs/Pleura: Low lung volumes and atelectasis with more bandlike subsegmental atelectasis towards the lung bases. No effusion. No pneumothorax. Upper Abdomen: No acute abnormalities present in the visualized portions of the upper abdomen. Musculoskeletal: No acute osseous abnormality or suspicious osseous lesion. Degenerative changes are present in the imaged spine and shoulders. No worrisome chest wall mass. No unexpected radiodense foreign bodies. IMPRESSION: 1. No expected radiopaque foreign body seen in the chest. 2. Left IJ approach central venous catheter tip terminates at the left brachiocephalic-superior caval junction. 3. Mediastinal widening present on comparison chest radiograph is likely a combination artifactual accentuation due to portable technique and abundant mediastinal fat noted on this exam. No worrisome aortic dilatation, or other acute aortic abnormality within  the limitations of an unenhanced CT. 4. Basilar atelectatic changes. Electronically Signed   By: Kreg Shropshire M.D.   On: 06/09/2020 23:26   MR ANGIO HEAD WO CONTRAST  Result Date: 06/10/2020 CLINICAL DATA:  Cerebral hemorrhage. EXAM: MRA HEAD WITHOUT CONTRAST TECHNIQUE: Angiographic images of the Circle of Willis were acquired using MRA technique without intravenous contrast. COMPARISON:  No pertinent prior exam. FINDINGS: Motion limited exam.  Within this limitation: Anterior circulation: No large vessel occlusion or evidence of proximal hemodynamically significant stenosis. No evidence of hemodynamically significant proximal stenosis, although evaluation limited by motion. No aneurysm identified. No evidence of vascular malformation in the region of the right thalamic hemorrhage, although acute blood products limit evaluation. There is intrinsic T1 hyperintensity within the hemorrhage, which also limits evaluation. Posterior circulation: No large vessel occlusion or evidence of proximal hemodynamically significant stenosis. Bilateral posterior communicating arteries. No aneurysm. IMPRESSION: 1. No evidence of a vascular malformation in the region of the right thalamic hemorrhage, although acute blood products limit evaluation. 2. No large vessel occlusion or evidence of proximal hemodynamically significant stenosis on this motion limited exam. Electronically Signed   By: Feliberto Harts MD   On: 06/10/2020 12:02   MR BRAIN W WO CONTRAST  Result Date: 06/10/2020 CLINICAL DATA:  Hemorrhage. EXAM: MRI HEAD WITHOUT AND WITH CONTRAST TECHNIQUE: Multiplanar, multiecho pulse sequences of the brain and surrounding structures were obtained without and with intravenous contrast. CONTRAST:  27mL GADAVIST GADOBUTROL 1 MMOL/ML IV SOLN COMPARISON:  CT Jun 09, 2020. FINDINGS: Mildly motion limited exam. Brain:  When comparing across modalities, no substantial change in size/extent of an acute right thalamic hemorrhage,  measuring up to approximately 3.8 by 2.4 cm. The hemorrhage demonstrates areas of intrinsic T1 hyperintensity. Similar extraventricular extension with blood products in right greater than left lateral ventricles, third ventricle, and fourth ventricle. No progressive ventriculomegaly. Surrounding edema with similar 4 mm of leftward midline shift. Outside of the hemorrhage itself, and no confluent restricted diffusion to suggest acute infarct. No visible mass lesion, although acute blood products limit evaluation. Moderate patchy T2/FLAIR hyperintensities within the white matter and pons, nonspecific but most likely related to chronic microvascular ischemic disease. Vascular: Further evaluated on concurrent MRA. Skull and upper cervical spine: Normal marrow signal. Sinuses/Orbits: Clear sinuses.  No acute orbital finding. Other: No mastoid effusions. IMPRESSION: 1. When comparing across modalities, no substantial change in size/extent of an acute right thalamic hemorrhage with intraventricular extension, as detailed above. Similar 4 mm of leftward midline shift. No progressive ventriculomegaly. 2. No specific evidence of acute infarct or visible mass lesion, although acute blood products limits evaluation. A follow-up MRI after resolution of hemorrhage could further evaluate if clinically indicated. 3. Moderate chronic microvascular ischemic disease. Electronically Signed   By: Feliberto Harts MD   On: 06/10/2020 11:51   DG Chest Portable 1 View  Result Date: 06/09/2020 CLINICAL DATA:  55 year old male with history of central line placement. EXAM: PORTABLE CHEST 1 VIEW COMPARISON:  Chest x-ray 01/02/2018. FINDINGS: Left-sided internal jugular central venous catheter with tip terminating near the junction of the innominate vein with the superior vena cava. Lung volumes are low. No consolidative airspace disease. No pleural effusions. No pneumothorax. No evidence of pulmonary edema. Heart size is mildly enlarged.  Marked widening of the mediastinum, new compared to the prior examination. IMPRESSION: 1. Left internal jugular central venous catheter in position, as above, with profound widening of the mediastinum. If there is clinical concern for aortic dissection and/or mediastinal hematoma, further evaluation with chest CTA would be recommended. Electronically Signed   By: Trudie Reed M.D.   On: 06/09/2020 18:11   ECHOCARDIOGRAM COMPLETE  Result Date: 06/10/2020    ECHOCARDIOGRAM REPORT   Patient Name:   Vrishank Moster III Date of Exam: 06/10/2020 Medical Rec #:  [id number]      Height:       70.0 in Accession #:    [id number]     Weight:       251.1 lb Date of Birth:  1965-09-15      BSA:          2.299 m Patient Age:    54 years       BP:           140/71 mmHg Patient Gender: M              HR:           102 bpm. Exam Location:  Inpatient Procedure: 2D Echo, Color Doppler and Cardiac Doppler Indications:    CVA  History:        Patient has prior history of Echocardiogram examinations, most                 recent 08/12/2014. Cardiomyopathy and CHF,                 Signs/Symptoms:Shortness of Breath; Risk Factors:Hypertension,                 Dyslipidemia and Diabetes.  Sonographer:    TAMARA CROWN RDCS  Referring Phys: [id number] ASHISH ARORA  Sonographer Comments: Patient is morbidly obese and Technically difficult study due to poor echo windows. IMPRESSIONS  1. Left ventricular ejection fraction, by estimation, is 65 to 70%. The left ventricle has normal function. The left ventricle has no regional wall motion abnormalities. There is severe left ventricular hypertrophy. Indeterminate diastolic filling due to E-A fusion.  2. Right ventricular systolic function is normal. The right ventricular size is normal. There is normal pulmonary artery systolic pressure. The estimated right ventricular systolic pressure is 28.4 mmHg.  3. The mitral valve is normal in structure. Trivial mitral valve regurgitation. No evidence of mitral  stenosis.  4. The aortic valve is normal in structure. Aortic valve regurgitation is trivial. No aortic stenosis is present.  5. The inferior vena cava is normal in size with greater than 50% respiratory variability, suggesting right atrial pressure of 3 mmHg.  6. Increased flow velocities may be secondary to anemia, thyrotoxicosis, hyperdynamic or high flow state. Conclusion(s)/Recommendation(s): No intracardiac source of embolism detected on this transthoracic study. A transesophageal echocardiogram is recommended to exclude cardiac source of embolism if clinically indicated. FINDINGS  Left Ventricle: Left ventricular ejection fraction, by estimation, is 65 to 70%. The left ventricle has normal function. The left ventricle has no regional wall motion abnormalities. The left ventricular internal cavity size was normal in size. There is  severe left ventricular hypertrophy. Indeterminate diastolic filling due to E-A fusion. Right Ventricle: The right ventricular size is normal. No increase in right ventricular wall thickness. Right ventricular systolic function is normal. There is normal pulmonary artery systolic pressure. The tricuspid regurgitant velocity is 2.52 m/s, and  with an assumed right atrial pressure of 3 mmHg, the estimated right ventricular systolic pressure is 28.4 mmHg. Left Atrium: Left atrial size was normal in size. Right Atrium: Right atrial size was normal in size. Pericardium: There is no evidence of pericardial effusion. Mitral Valve: The mitral valve is normal in structure. Trivial mitral valve regurgitation. No evidence of mitral valve stenosis. MV peak gradient, 13.0 mmHg. The mean mitral valve gradient is 4.0 mmHg. Tricuspid Valve: The tricuspid valve is normal in structure. Tricuspid valve regurgitation is trivial. No evidence of tricuspid stenosis. Aortic Valve: The aortic valve is normal in structure. Aortic valve regurgitation is trivial. No aortic stenosis is present. Aortic valve  mean gradient measures 7.0 mmHg. Aortic valve peak gradient measures 13.8 mmHg. Aortic valve area, by VTI measures 3.77 cm. Pulmonic Valve: The pulmonic valve was not well visualized. Pulmonic valve regurgitation is trivial. No evidence of pulmonic stenosis. Aorta: The aortic root is normal in size and structure. Venous: The inferior vena cava is normal in size with greater than 50% respiratory variability, suggesting right atrial pressure of 3 mmHg. IAS/Shunts: No atrial level shunt detected by color flow Doppler.  LEFT VENTRICLE PLAX 2D LVIDd:         4.00 cm     Diastology LVIDs:         2.00 cm     LV e' medial:    4.57 cm/s LV PW:         1.70 cm     LV E/e' medial:  16.3 LV IVS:        1.60 cm     LV e' lateral:   7.83 cm/s LVOT diam:     2.30 cm     LV E/e' lateral: 9.5 LV SV:         106 LV SV Index:  46 LVOT Area:     4.15 cm  LV Volumes (MOD) LV vol d, MOD A2C: 78.9 ml LV vol d, MOD A4C: 93.0 ml LV vol s, MOD A2C: 23.9 ml LV vol s, MOD A4C: 22.9 ml LV SV MOD A2C:     55.0 ml LV SV MOD A4C:     93.0 ml LV SV MOD BP:      63.6 ml RIGHT VENTRICLE RV Basal diam:  3.70 cm RV Mid diam:    2.90 cm RV S prime:     25.20 cm/s TAPSE (M-mode): 3.7 cm LEFT ATRIUM             Index       RIGHT ATRIUM           Index LA diam:        3.20 cm 1.39 cm/m  RA Area:     13.30 cm LA Vol (A2C):   65.6 ml 28.53 ml/m RA Volume:   31.10 ml  13.53 ml/m LA Vol (A4C):   59.8 ml 26.01 ml/m LA Biplane Vol: 63.6 ml 27.66 ml/m  AORTIC VALVE                    PULMONIC VALVE AV Area (Vmax):    3.19 cm     PV Vmax:       1.64 m/s AV Area (Vmean):   3.23 cm     PV Vmean:      113.000 cm/s AV Area (VTI):     3.77 cm     PV VTI:        0.256 m AV Vmax:           186.00 cm/s  PV Peak grad:  10.8 mmHg AV Vmean:          124.000 cm/s PV Mean grad:  6.0 mmHg AV VTI:            0.282 m AV Peak Grad:      13.8 mmHg AV Mean Grad:      7.0 mmHg LVOT Vmax:         143.00 cm/s LVOT Vmean:        96.300 cm/s LVOT VTI:          0.256 m  LVOT/AV VTI ratio: 0.91  AORTA Ao Root diam: 3.20 cm Ao Asc diam:  3.20 cm Ao Arch diam: 3.2 cm MITRAL VALVE                TRICUSPID VALVE MV Area (PHT): 4.63 cm     TR Peak grad:   25.4 mmHg MV Area VTI:   3.39 cm     TR Vmax:        252.00 cm/s MV Peak grad:  13.0 mmHg MV Mean grad:  4.0 mmHg     SHUNTS MV Vmax:       1.80 m/s     Systemic VTI:  0.26 m MV Vmean:      94.4 cm/s    Systemic Diam: 2.30 cm MV Decel Time: 164 msec MV E velocity: 74.30 cm/s MV A velocity: 106.00 cm/s MV E/A ratio:  0.70 Weston Brass MD Electronically signed by Weston Brass MD Signature Date/Time: 06/10/2020/11:44:08 AM    Final    CT HEAD CODE STROKE WO CONTRAST  Result Date: 06/09/2020 CLINICAL DATA:  Code stroke.  Rightward gaze.  Left arm weakness. EXAM: CT HEAD WITHOUT CONTRAST TECHNIQUE: Contiguous axial images were obtained from the base  of the skull through the vertex without intravenous contrast. COMPARISON:  None. FINDINGS: Brain: An acute hemorrhage centered in the right thalamus measures 3.6 x 2.8 x 2.7 cm (approximate volume of 14 mL). There is intraventricular extension with a small amount of hemorrhage in the right greater than left lateral ventricles, third ventricle, and fourth ventricle. The ventricles are not grossly dilated, however prior imaging is not available to no the patient's baseline ventricle size. There is mild edema surrounding the right thalamic hemorrhage. There is 4 mm of leftward midline shift at the level of the thalami. Hypodensities in the cerebral white matter bilaterally are nonspecific but likely reflect mild chronic small vessel ischemic disease given the patient's vascular risk factors. No acute cortically based infarct or extra-axial fluid collection is evident. Vascular: No hyperdense vessel. Skull: No fracture or suspicious osseous lesion. Sinuses/Orbits: Minimal mucosal thickening in the right sphenoid sinus. Clear mastoid air cells. Left cataract extraction. Rightward gaze.  Other: None. ASPECTS [facility]([place] Stroke Program Early CT Score) Not scored in the presence of acute hemorrhage. IMPRESSION: 1. 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 2. 4 mm of leftward midline shift. 3. Mild chronic small vessel ischemic disease. These results were communicated to Dr. Wilford CornerArora at 4:17 pm on 06/09/2020 by text page via the [facility] messaging system. Electronically Signed   By: Sebastian AcheAllen  Grady M.D.   On: 06/09/2020 16:21   VAS US CAROTID  Result Date: 06/12/2020 Carotid Arterial Duplex Study Patient Name:  Addison Naegeliarl Rammel III  Date of Exam:   06/10/2020 Medical Rec #: [id number]       Accession #:    [id number] Date of Birth: 16-Apr-1965       Patient Gender: M Patient Age:   54Y [facility] Procedure:      VAS US CAROTID Referring Phys: [id number] Marvel PlanJINDONG XU --------------------------------------------------------------------------------  Indications:       CVA. Risk Factors:      Hypertension, hyperlipidemia. Limitations        Today's exam was limited due to the body habitus of the                    patient, the patient's respiratory variation, patient                    movement, patient somnolence and a central line. Comparison Study:  No prior studies. Performing Technologist: Chanda BusingGregory Collins RVT  Examination Guidelines: A complete evaluation includes B-mode imaging, spectral Doppler, color Doppler, and power Doppler as needed of all accessible portions of each vessel. Bilateral testing is considered an integral part of a complete examination. Limited examinations for reoccurring indications may be performed as noted.  Right Carotid Findings: +----------+--------+--------+--------+-----------------------+--------+           PSV cm/sEDV cm/sStenosisPlaque Description     Comments +----------+--------+--------+--------+-----------------------+--------+ CCA Prox  101     12                                               +----------+--------+--------+--------+-----------------------+--------+ CCA Distal82      11              smooth and heterogenous         +----------+--------+--------+--------+-----------------------+--------+ ICA Prox  58      18  smooth and heterogenous         +----------+--------+--------+--------+-----------------------+--------+ ICA Distal57      17                                     tortuous +----------+--------+--------+--------+-----------------------+--------+ ECA       136     19                                              +----------+--------+--------+--------+-----------------------+--------+ +----------+--------+-------+--------+-------------------+           PSV cm/sEDV cmsDescribeArm Pressure (mmHG) +----------+--------+-------+--------+-------------------+ Subclavian120                                        +----------+--------+-------+--------+-------------------+ +---------+--------+--+--------+--+---------+ VertebralPSV cm/s56EDV cm/s13Antegrade +---------+--------+--+--------+--+---------+  Left Carotid Findings: +----------+--------+--------+--------+-----------------------+--------+           PSV cm/sEDV cm/sStenosisPlaque Description     Comments +----------+--------+--------+--------+-----------------------+--------+ CCA Prox  58      8               smooth and heterogenous         +----------+--------+--------+--------+-----------------------+--------+ CCA Distal69      11              smooth and heterogenous         +----------+--------+--------+--------+-----------------------+--------+ ICA Prox  69      20              smooth and heterogenoustortuous +----------+--------+--------+--------+-----------------------+--------+ ICA Distal112     34                                     tortuous +----------+--------+--------+--------+-----------------------+--------+ ECA       99      14                                               +----------+--------+--------+--------+-----------------------+--------+ +----------+--------+--------+--------+-------------------+           PSV cm/sEDV cm/sDescribeArm Pressure (mmHG) +----------+--------+--------+--------+-------------------+ [id number]                                         +----------+--------+--------+--------+-------------------+ +---------+--------+--+--------+--+---------+ VertebralPSV cm/s41EDV cm/s10Antegrade +---------+--------+--+--------+--+---------+   Summary: Right Carotid: Velocities in the right ICA are consistent with a 1-39% stenosis. Left Carotid: Velocities in the left ICA are consistent with a 1-39% stenosis. Vertebrals: Bilateral vertebral arteries demonstrate antegrade flow. *See table(s) above for measurements and observations.  Electronically signed by Delia Heady MD on 06/12/2020 at 2:03:49 PM.    Final    [place] EKG SITE RITE  Result Date: 06/09/2020 If Site Rite image not attached, placement could not be confirmed due to current cardiac rhythm.   PHYSICAL EXAM  Temp:  [98 F (36.7 C)-98.6 F (37 C)] 98.4 F (36.9 C) (05/27 1605) Pulse Rate:  [63-70] 68 (05/27 1614) Resp:  [14-18] 18 (05/27 1605) BP: (91-133)/(44-77) 133/64 (05/27 1614) SpO2:  [95 %-97 %]  95 % (05/27 1605)  General -obese middle-aged African-American male.  Not in distress ophthalmologic - fundi not visualized due to noncooperation.  Cardiovascular - Regular rhythm and rate.  Neuro -patient is stuporous.  He cannot barely partially open his eyes to deep sternal rub.  He mumbles a few words which can be understood with some difficulty.  He has left gaze deviation and partially look to the right.  He does follow some commands on the right., PERRL,  s. Left facial droop. Tongue midline. RUE and RLE 5/5, LUE 2/5 and LLE 2-3/5, left sensation loss on the face and LUE and LLE, right FTN intact, gait not tested.      ASSESSMENT/PLAN Mr. Yadier Bramhall III is a 55 y.o. male with history of hypertension, obesity, cardiomyopathy, hyperlipidemia, CHF admitted for right-sided gaze, right-sided weakness numbness, fall. No tPA given due to ICH.    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  2D Echo EF 65-70%  CUS unremarkable  LDL 78  HgbA1c 5.5  UDS negative  SCDs for VTE prophylaxis  No antithrombotic prior to admission, now on No antithrombotic due to ICH  Ongoing aggressive stroke risk factor management  Therapy recommendations: Pending  Disposition: Pending  Cerebral edema  CT and MRI showed midline shift 4mm  CT repeat stable.  On 3% saline @ 75->40 to avoid fluid overload  Na 141-144-147->142  Lasix 40 x 1  Sodium Q6  Hypertensive emergency . Home medication metoprolol 100 twice daily, HCTZ, losartan . Unstable . On Cleviprex, taper off as able . amlodipine 10, metoprolol 100 twice daily . HCTZ 25, losartan 50 twice daily, clonidine 0.1 3 times daily  Long term BP goal normotensive  Hyperlipidemia  Home meds: Lipitor 40  LDL 78, goal < 70  Now on no statin given recent ICH  Resume statin at discharge  AKI  Cre 0.9->1.68->1.19  On IVF   BMP monitoring  Dysphagia  Speech on board  dys 3 and nectar thick -> dys 2 and thin liquid  Aspiration precaution  Low-grade fever  Tmax 101.2->100.2->100.3  Tachycardia   Blood culture no growth   UA WBC 6-10  COVID PCR neg  CT chest basilar atelectasis  Alcohol abuse  About 24 drinks per  week  On CIWA protocol  FA/MVI/B1  Other Stroke Risk Factors  Obesity, Body mass index is 36.03 kg/m.   Likely obstructive sleep apnea, undiagnosed    Other Active Problems    Hospital day # 9    .  Patient has clearly had a neurological change in his level of consciousness with leftward head and eye deviation.  Recommend stat CT scan of the head to look for hematoma expansion versus hydrocephalus and EEG for seizure activity.  Patient is already on Depakote for headaches I would recommend checking stat valproic acid and ammonia level.  If CT is unchanged and valproic acid level is not high may consider increasing the dose.  Neuro hospitalist team covering we will follow-up.  Discussed with Dr.Ghimire.  Greater than 50% time during this 35-minute visit was spent in counseling and  coordination of care about possible seizure versus worsening hemorrhage and hydrocephalus and planning in and discussion with care team Delia Heady, MD Stroke Neurology 06/18/2020 4:43 PM    To contact Stroke Continuity provider, please refer to [email or web address]. After hours, contact General Neurology

## 2020-06-18 NOTE — Progress Notes (Signed)
eLink Physician-Brief Progress Note Patient Name: [name] DOB: [birth year] MRN: [id number]   Date of Service  06/18/2020  HPI/Events of Note  Patient transferred to the ICU over concern for a change in his neuro status, he was made NPO. He has PRN Labetalol and Hydralazine orders for blood pressure > 160 mmHg, BP currently 140-149 range.  eICU Interventions  Bedside RN instructed to hold PO medications overnight  and treat any BP elevations with PRN medications. She was also instructed to do hourly neuro checks overnight.        [name] 06/18/2020, 11:02 PM

## 2020-06-18 NOTE — Progress Notes (Addendum)
Inpatient Rehabilitation Admissions Coordinator  I met at bedside with patient . He awakes, but goes immediately back to sleep. I contacted Dr. Sloan Leiter to request review of current medication regimen of meds causing lethargy. I have CIR bed on Saturday that I can admit him to if he is more alert. Dr Naaman Plummer will see patient in the morning for final approval. [place] can call [place] charge nurse at 12 noon at [phone] to give report. I will make the arrangements to admit on Saturday pending he is more alert.  I contacted his wife by phone and she is in agreement to admission plans.  Danne Baxter, RN, MSN Rehab Admissions Coordinator [phone] 06/18/2020 10:31 AM

## 2020-06-18 NOTE — Progress Notes (Signed)
STAT EEG deferred to a later time per Dr. Jerral Ralph, due to case status.

## 2020-06-18 NOTE — Progress Notes (Signed)
LTM EEG hooked up and running - no initial skin breakdown - push button tested - neuro notified. [facility] monitoring. °[facility] monitored, Event button test confirmed by [facility]. ° °

## 2020-06-19 ENCOUNTER — Inpatient Hospital Stay (HOSPITAL_COMMUNITY): Payer: 59

## 2020-06-19 DIAGNOSIS — I610 Nontraumatic intracerebral hemorrhage in hemisphere, subcortical: Secondary | ICD-10-CM | POA: Diagnosis not present

## 2020-06-19 DIAGNOSIS — I619 Nontraumatic intracerebral hemorrhage, unspecified: Secondary | ICD-10-CM | POA: Diagnosis not present

## 2020-06-19 DIAGNOSIS — R569 Unspecified convulsions: Secondary | ICD-10-CM

## 2020-06-19 DIAGNOSIS — G936 Cerebral edema: Secondary | ICD-10-CM | POA: Diagnosis not present

## 2020-06-19 DIAGNOSIS — I615 Nontraumatic intracerebral hemorrhage, intraventricular: Secondary | ICD-10-CM | POA: Diagnosis not present

## 2020-06-19 LAB — BASIC METABOLIC PANEL
Anion gap: 10 (ref 5–15)
BUN: 38 mg/dL — ABNORMAL HIGH (ref 6–20)
CO2: 24 mmol/L (ref 22–32)
Calcium: 9.6 mg/dL (ref 8.9–10.3)
Chloride: 98 mmol/L (ref 98–111)
Creatinine, Ser: 1.41 mg/dL — ABNORMAL HIGH (ref 0.61–1.24)
GFR, Estimated: 58 mL/min — ABNORMAL LOW (ref >60)
Glucose, Bld: 84 mg/dL (ref 70–99)
Potassium: 4.4 mmol/L (ref 3.5–5.1)
Sodium: 132 mmol/L — ABNORMAL LOW (ref 135–145)

## 2020-06-19 LAB — GLUCOSE, CAPILLARY: Glucose-Capillary: 97 mg/dL (ref 70–99)

## 2020-06-19 MED ORDER — CLONIDINE HCL 0.1 MG PO TABS: 0.1000 mg | ORAL_TABLET | Freq: Three times a day (TID) | ORAL | Status: DC

## 2020-06-19 MED ORDER — LEVETIRACETAM IN NACL 500 MG/100ML IV SOLN
500.0000 mg | Freq: Two times a day (BID) | INTRAVENOUS | Status: DC
  Administered 2020-06-19 – 2020-06-21 (×5): 500 mg via INTRAVENOUS
  Filled 2020-06-19 (×5): qty 100

## 2020-06-19 NOTE — Procedures (Addendum)
Patient Name: [name]  MRN: [id number]  Epilepsy Attending: Charlsie Quest  Referring Physician/Provider: Dr Delia Heady Duration:  06/18/2020 2040 to 06/19/2020 2040  Patient history: 54yo M with acute neurological change in his level of consciousness with leftward head and eye deviation. EEG to evaluate seizure activity.  Level of alertness: Awake, asleep  AEDs during EEG study: LEV  Technical aspects: This EEG study was done with scalp electrodes positioned according to the 10-20 International system of electrode placement. Electrical activity was acquired at a sampling rate of 500Hz  and reviewed with a high frequency filter of 70Hz  and a low frequency filter of 1Hz . EEG data were recorded continuously and digitally stored.   Description: No posterior dominant rhythm was seen. Sleep was characterized by sleep spindles (12-14hz ), maximal frontocenral region. EEG showed continuous generalized and lateralized right hemisphere 3 to 6 Hz theta-delta slowing admixed with generalized 15-18Hz  beta activity. Hyperventilation and photic stimulation were not performed.     ABNORMALITY - Continuous slow, generalized  and lateralized right hemisphere  IMPRESSION: This study is  suggestive of suggestive of cortical dysfunction arising from right hemisphere likely secondary to underlying bleed as well as moderate diffuse encephalopathy, nonspecific etiology. No seizures or definite epileptiform discharges were seen throughout the recording.   Mickeal Daws 

## 2020-06-19 NOTE — Progress Notes (Signed)
STROKE TEAM PROGRESS NOTE   SUBJECTIVE (INTERVAL HISTORY) RN and wife are at the bedside. Pt drowsy sleepy but arousable and follows simple commands. Pt was transferred back to ICU after a likely seizure episode with left gaze and left neck turning with jerking followed by post ictal. CT unchanged. LTM EEG showed no further seizure. depakote level was low at 45, started on keppra today.    OBJECTIVE Temp:  [97.7 F (36.5 C)-98.6 F (37 C)] 98.3 F (36.8 C) (05/28 1100) Pulse Rate:  [65-87] 82 (05/28 1012) Cardiac Rhythm: Normal sinus rhythm (05/28 0800) Resp:  [14-25] 19 (05/28 0907) BP: (91-169)/(49-103) 162/70 (05/28 1012) SpO2:  [89 %-100 %] 94 % (05/28 0406) Weight:  [109.3 kg-109.5 kg] 109.5 kg (05/28 0606)  Recent Labs  Lab 06/18/20 1615  GLUCAP 148*   Recent Labs  Lab 06/13/20 0412 06/14/20 0516 06/18/20 1823 06/19/20 0552  NA 134* 131* 128* 132*  K 3.6 4.0 5.4* 4.4  CL 102 96* 95* 98  CO2 26 26 23 24   GLUCOSE 119* 127* 117* 84  BUN 10 18 38* 38*  CREATININE 1.13 1.18 1.92* 1.41*  CALCIUM 8.7* 9.0 9.5 9.6  MG  --   --  2.6*  --   PHOS  --   --  5.6*  --    Recent Labs  Lab 06/18/20 1823  AST 25  ALT 31  ALKPHOS 54  BILITOT 1.1  PROT 7.5  ALBUMIN 3.7   Recent Labs  Lab 06/13/20 0412 06/14/20 0516 06/18/20 1823  WBC 7.5 7.0 9.0  NEUTROABS  --   --  7.3  HGB 11.2* 11.3* 12.8*  HCT 33.9* 33.7* 37.9*  MCV 94.4 93.6 91.8  PLT 235 239 488*   No results for input(s): CKTOTAL, CKMB, CKMBINDEX, TROPONINI in the last 168 hours. No results for input(s): LABPROT, INR in the last 72 hours. Recent Labs    06/18/20 1959  COLORURINE YELLOW  LABSPEC 1.014  PHURINE 5.0  GLUCOSEU NEGATIVE  HGBUR NEGATIVE  BILIRUBINUR NEGATIVE  KETONESUR NEGATIVE  PROTEINUR NEGATIVE  NITRITE NEGATIVE  LEUKOCYTESUR NEGATIVE       Component Value Date/Time   CHOL 196 06/09/2020 1809   TRIG 153 (H) 06/12/2020 0420   HDL 52 06/09/2020 1809   CHOLHDL 3.8 06/09/2020 1809    VLDL 66 (H) 06/09/2020 1809   LDLCALC 78 06/09/2020 1809   Lab Results  Component Value Date   HGBA1C 5.5 06/09/2020      Component Value Date/Time   LABOPIA NONE DETECTED 06/09/2020 2256   COCAINSCRNUR NONE DETECTED 06/09/2020 2256   LABBENZ NONE DETECTED 06/09/2020 2256   AMPHETMU NONE DETECTED 06/09/2020 2256   THCU NONE DETECTED 06/09/2020 2256   LABBARB NONE DETECTED 06/09/2020 2256    No results for input(s): ETH in the last 168 hours.  I have personally reviewed the radiological images below and agree with the radiology interpretations.  CT Head Wo Contrast  Result Date: 06/18/2020 CLINICAL DATA:  Stroke follow-up. EXAM: CT HEAD WITHOUT CONTRAST TECHNIQUE: Contiguous axial images were obtained from the base of the skull through the vertex without intravenous contrast. COMPARISON:  Jun 18, 2019. FINDINGS: Brain: Right thalamic hemorrhage is unchanged in size. Similar intraventricular extension of hemorrhage with small volume of hemorrhage layering in the occipital horns bilaterally. Similar mild ventriculomegaly with mild rounding of the temporal horns. There is similar surrounding edema. Similar leftward midline shift with effacement of the third ventricle. No evidence of acute/interval large vascular territory infarct. Similar  patchy white matter hypoattenuation, most likely related to chronic microvascular ischemic disease. Vascular: No hyperdense vessels identified. Skull: No acute fracture. Sinuses/Orbits: Visualized sinuses are largely clear. No acute orbital abnormality. Other: No mastoid effusions. IMPRESSION: 1. Similar size of the right thalamic hemorrhage with similar intraventricular extension. Similar leftward midline shift. 2. Similar mild ventriculomegaly. Recommend continued attention on follow-up. Electronically Signed   By: Feliberto Harts MD   On: 06/18/2020 18:38   CT HEAD WO CONTRAST  Result Date: 06/17/2020 CLINICAL DATA:  Intracranial hemorrhage EXAM: CT  HEAD WITHOUT CONTRAST TECHNIQUE: Contiguous axial images were obtained from the base of the skull through the vertex without intravenous contrast. COMPARISON:  CT head 06/11/2020 FINDINGS: Brain: Right thalamic hemorrhage unchanged in size. Mild intraventricular hemorrhage shows partial clearing. Mild midline shift to the left is unchanged. Mild ventricular dilatation has progressed in the interval. Patchy white matter hypodensity bilaterally most consistent with chronic microvascular ischemia. No acute ischemic infarct. Vascular: Negative for hyperdense vessel Skull: Negative Sinuses/Orbits: Mild mucosal edema paranasal sinuses. Left cataract extraction. Chronic nasal bone fracture unchanged. Other: None IMPRESSION: Right thalamic hematoma stable Partial clearing of intraventricular hemorrhage. Mild ventricular enlargement, with progression since the prior study. Electronically Signed   By: Marlan Palau M.D.   On: 06/17/2020 09:18   CT HEAD WO CONTRAST  Result Date: 06/11/2020 CLINICAL DATA:  Left arm weakness. Intracranial hemorrhage follow-up EXAM: CT HEAD WITHOUT CONTRAST TECHNIQUE: Contiguous axial images were obtained from the base of the skull through the vertex without intravenous contrast. COMPARISON:  Head CT 06/10/2020 FINDINGS: Brain: Unchanged intraparenchymal hemorrhage centered in the right thalamus with intraventricular extension. Size and configuration of the ventricles is unchanged. Mild leftward midline shift is also unchanged. No new site of hemorrhage. Vascular: No abnormal hyperdensity of the major intracranial arteries or dural venous sinuses. No intracranial atherosclerosis. Skull: The visualized skull base, calvarium and extracranial soft tissues are normal. Sinuses/Orbits: No fluid levels or advanced mucosal thickening of the visualized paranasal sinuses. No mastoid or middle ear effusion. The orbits are normal. IMPRESSION: 1. Unchanged size and configuration of right thalamic  intraparenchymal hemorrhage with intraventricular extension. 2. Unchanged mild leftward midline shift. Electronically Signed   By: Deatra Robinson M.D.   On: 06/11/2020 18:49   CT HEAD WO CONTRAST  Result Date: 06/09/2020 CLINICAL DATA:  Intracranial hemorrhage follow EXAM: CT HEAD WITHOUT CONTRAST TECHNIQUE: Contiguous axial images were obtained from the base of the skull through the vertex without intravenous contrast. COMPARISON:  06/09/2020 at 4:10 p.m. FINDINGS: Brain: Unchanged appearance of intracranial hemorrhage in the right thalamus with extension into the ventricles. 4 mm leftward midline shift is also unchanged. Vascular: No abnormal hyperdensity of the major intracranial arteries or dural venous sinuses. No intracranial atherosclerosis. Skull: The visualized skull base, calvarium and extracranial soft tissues are normal. Sinuses/Orbits: No fluid levels or advanced mucosal thickening of the visualized paranasal sinuses. No mastoid or middle ear effusion. The orbits are normal. IMPRESSION: 1. Unchanged appearance of right thalamic intracranial hemorrhage with extension into the ventricles. 2. Unchanged 4 mm leftward midline shift. Electronically Signed   By: Deatra Robinson M.D.   On: 06/09/2020 22:18   CT SOFT TISSUE NECK WO CONTRAST  Result Date: 06/09/2020 CLINICAL DATA:  Foreign body in the neck. EXAM: CT NECK WITHOUT CONTRAST TECHNIQUE: Multidetector CT imaging of the neck was performed following the standard protocol without intravenous contrast. COMPARISON:  None. FINDINGS: PHARYNX AND LARYNX: The nasopharynx, oropharynx and larynx are normal. Visible portions of the  oral cavity, tongue base and floor of mouth are normal. Normal epiglottis, vallecula and pyriform sinuses. The larynx is normal. No retropharyngeal abscess, effusion or lymphadenopathy. SALIVARY GLANDS: Normal parotid, submandibular and sublingual glands. THYROID: Normal. LYMPH NODES: No enlarged or abnormal density lymph nodes.  VASCULAR: Major cervical vessels are patent. Left IJ central venous catheter. LIMITED INTRACRANIAL: Normal. VISUALIZED ORBITS: Normal. MASTOIDS AND VISUALIZED PARANASAL SINUSES: No fluid levels or advanced mucosal thickening. No mastoid effusion. SKELETON: No bony spinal canal stenosis. No lytic or blastic lesions. UPPER CHEST: Clear. OTHER: None. IMPRESSION: Normal CT of the neck. Electronically Signed   By: Deatra Robinson M.D.   On: 06/09/2020 22:22   CT CHEST WO CONTRAST  Result Date: 06/09/2020 CLINICAL DATA:  Mediastinal widening on chest radiograph, possible foreign body EXAM: CT CHEST WITHOUT CONTRAST TECHNIQUE: Multidetector CT imaging of the chest was performed following the standard protocol without IV contrast. COMPARISON:  Radiograph 06/09/2020, 01/02/2018, contemporary neck radiograph FINDINGS: Cardiovascular: Left IJ approach central venous catheter tip terminates near the level of the left brachiocephalic-superior caval junction. Cardiac size is top normal no sizable pericardial effusion. Atherosclerotic plaque within the normal caliber aorta. No hyperdense mural thickening to suggest an intra mural hematoma. No periaortic stranding or hemorrhage is evident on this unenhanced exam. Central pulmonary arteries are normal caliber. Luminal evaluation of the vasculature is precluded in the absence of contrast media. Mediastinum/Nodes: Some pronounced upper mediastinal widening seen on the chest radiograph is likely a combination of portable technique and abundant mediastinal fat noted on this exam. No mediastinal fluid or gas. Normal thyroid gland and thoracic inlet. No acute abnormality of the trachea or esophagus. No worrisome mediastinal or axillary adenopathy. Hilar nodal evaluation is limited in the absence of intravenous contrast media. Lungs/Pleura: Low lung volumes and atelectasis with more bandlike subsegmental atelectasis towards the lung bases. No effusion. No pneumothorax. Upper Abdomen: No  acute abnormalities present in the visualized portions of the upper abdomen. Musculoskeletal: No acute osseous abnormality or suspicious osseous lesion. Degenerative changes are present in the imaged spine and shoulders. No worrisome chest wall mass. No unexpected radiodense foreign bodies. IMPRESSION: 1. No expected radiopaque foreign body seen in the chest. 2. Left IJ approach central venous catheter tip terminates at the left brachiocephalic-superior caval junction. 3. Mediastinal widening present on comparison chest radiograph is likely a combination artifactual accentuation due to portable technique and abundant mediastinal fat noted on this exam. No worrisome aortic dilatation, or other acute aortic abnormality within the limitations of an unenhanced CT. 4. Basilar atelectatic changes. Electronically Signed   By: Kreg Shropshire M.D.   On: 06/09/2020 23:26   MR ANGIO HEAD WO CONTRAST  Result Date: 06/10/2020 CLINICAL DATA:  Cerebral hemorrhage. EXAM: MRA HEAD WITHOUT CONTRAST TECHNIQUE: Angiographic images of the Circle of Willis were acquired using MRA technique without intravenous contrast. COMPARISON:  No pertinent prior exam. FINDINGS: Motion limited exam.  Within this limitation: Anterior circulation: No large vessel occlusion or evidence of proximal hemodynamically significant stenosis. No evidence of hemodynamically significant proximal stenosis, although evaluation limited by motion. No aneurysm identified. No evidence of vascular malformation in the region of the right thalamic hemorrhage, although acute blood products limit evaluation. There is intrinsic T1 hyperintensity within the hemorrhage, which also limits evaluation. Posterior circulation: No large vessel occlusion or evidence of proximal hemodynamically significant stenosis. Bilateral posterior communicating arteries. No aneurysm. IMPRESSION: 1. No evidence of a vascular malformation in the region of the right thalamic hemorrhage, although  acute blood  products limit evaluation. 2. No large vessel occlusion or evidence of proximal hemodynamically significant stenosis on this motion limited exam. Electronically Signed   By: Feliberto Harts MD   On: 06/10/2020 12:02   MR BRAIN W WO CONTRAST  Result Date: 06/10/2020 CLINICAL DATA:  Hemorrhage. EXAM: MRI HEAD WITHOUT AND WITH CONTRAST TECHNIQUE: Multiplanar, multiecho pulse sequences of the brain and surrounding structures were obtained without and with intravenous contrast. CONTRAST:  10mL GADAVIST GADOBUTROL 1 MMOL/ML IV SOLN COMPARISON:  CT Jun 09, 2020. FINDINGS: Mildly motion limited exam. Brain: When comparing across modalities, no substantial change in size/extent of an acute right thalamic hemorrhage, measuring up to approximately 3.8 by 2.4 cm. The hemorrhage demonstrates areas of intrinsic T1 hyperintensity. Similar extraventricular extension with blood products in right greater than left lateral ventricles, third ventricle, and fourth ventricle. No progressive ventriculomegaly. Surrounding edema with similar 4 mm of leftward midline shift. Outside of the hemorrhage itself, and no confluent restricted diffusion to suggest acute infarct. No visible mass lesion, although acute blood products limit evaluation. Moderate patchy T2/FLAIR hyperintensities within the white matter and pons, nonspecific but most likely related to chronic microvascular ischemic disease. Vascular: Further evaluated on concurrent MRA. Skull and upper cervical spine: Normal marrow signal. Sinuses/Orbits: Clear sinuses.  No acute orbital finding. Other: No mastoid effusions. IMPRESSION: 1. When comparing across modalities, no substantial change in size/extent of an acute right thalamic hemorrhage with intraventricular extension, as detailed above. Similar 4 mm of leftward midline shift. No progressive ventriculomegaly. 2. No specific evidence of acute infarct or visible mass lesion, although acute blood products limits  evaluation. A follow-up MRI after resolution of hemorrhage could further evaluate if clinically indicated. 3. Moderate chronic microvascular ischemic disease. Electronically Signed   By: Feliberto Harts MD   On: 06/10/2020 11:51   DG Chest Portable 1 View  Result Date: 06/09/2020 CLINICAL DATA:  55 year old male with history of central line placement. EXAM: PORTABLE CHEST 1 VIEW COMPARISON:  Chest x-ray 01/02/2018. FINDINGS: Left-sided internal jugular central venous catheter with tip terminating near the junction of the innominate vein with the superior vena cava. Lung volumes are low. No consolidative airspace disease. No pleural effusions. No pneumothorax. No evidence of pulmonary edema. Heart size is mildly enlarged. Marked widening of the mediastinum, new compared to the prior examination. IMPRESSION: 1. Left internal jugular central venous catheter in position, as above, with profound widening of the mediastinum. If there is clinical concern for aortic dissection and/or mediastinal hematoma, further evaluation with chest CTA would be recommended. Electronically Signed   By: Trudie Reed M.D.   On: 06/09/2020 18:11   EEG adult  Result Date: 06/18/2020 Charlsie Quest, MD     06/18/2020 10:26 PM Patient Name: Isaiyah Feldhaus III MRN: [id number] Epilepsy Attending: Charlsie Quest Referring Physician/Provider: Dr Delia Heady Date: 06/18/2020 Duration: 21.18 mins Patient history:  55yo M with acute neurological change in his level of consciousness with leftward head and eye deviation. EEG to evaluate seizure activity. Level of alertness: Awake AEDs during EEG study: VPA Technical aspects: This EEG study was done with scalp electrodes positioned according to the 10-20 International system of electrode placement. Electrical activity was acquired at a sampling rate of 500Hz  and reviewed with a high frequency filter of 70Hz  and a low frequency filter of 1Hz . EEG data were recorded continuously and digitally  stored. Description: No posterior dominant rhythm was seen. EEG showed continuous generalized and lateralized right hemisphere 3 to 6 Hz theta-delta  slowing admixed with generalized 15-18Hz  beta activity. Hyperventilation and photic stimulation were not performed.   ABNORMALITY - Continuous slow, generalized  and lateralized right hemisphere IMPRESSION: This study is  suggestive of suggestive of cortical dysfunction arising from right hemisphere likely secondary to underlying bleed as well as moderate diffuse encephalopathy, nonspecific etiology. No seizures or definite epileptiform discharges were seen throughout the recording. Dr Derry Lory was notified. Charlsie Quest   ECHOCARDIOGRAM COMPLETE  Result Date: 06/10/2020    ECHOCARDIOGRAM REPORT   Patient Name:   Dontavion Noxon III Date of Exam: 06/10/2020 Medical Rec #:  [id number]      Height:       70.0 in Accession #:    [id number]     Weight:       251.1 lb Date of Birth:  06-28-1965      BSA:          2.299 m Patient Age:    54 years       BP:           140/71 mmHg Patient Gender: M              HR:           102 bpm. Exam Location:  Inpatient Procedure: 2D Echo, Color Doppler and Cardiac Doppler Indications:    CVA  History:        Patient has prior history of Echocardiogram examinations, most                 recent 08/12/2014. Cardiomyopathy and CHF,                 Signs/Symptoms:Shortness of Breath; Risk Factors:Hypertension,                 Dyslipidemia and Diabetes.  Sonographer:    Neomia Dear RDCS Referring Phys: [id number] ASHISH ARORA  Sonographer Comments: Patient is morbidly obese and Technically difficult study due to poor echo windows. IMPRESSIONS  1. Left ventricular ejection fraction, by estimation, is 65 to 70%. The left ventricle has normal function. The left ventricle has no regional wall motion abnormalities. There is severe left ventricular hypertrophy. Indeterminate diastolic filling due to E-A fusion.  2. Right ventricular systolic function  is normal. The right ventricular size is normal. There is normal pulmonary artery systolic pressure. The estimated right ventricular systolic pressure is 28.4 mmHg.  3. The mitral valve is normal in structure. Trivial mitral valve regurgitation. No evidence of mitral stenosis.  4. The aortic valve is normal in structure. Aortic valve regurgitation is trivial. No aortic stenosis is present.  5. The inferior vena cava is normal in size with greater than 50% respiratory variability, suggesting right atrial pressure of 3 mmHg.  6. Increased flow velocities may be secondary to anemia, thyrotoxicosis, hyperdynamic or high flow state. Conclusion(s)/Recommendation(s): No intracardiac source of embolism detected on this transthoracic study. A transesophageal echocardiogram is recommended to exclude cardiac source of embolism if clinically indicated. FINDINGS  Left Ventricle: Left ventricular ejection fraction, by estimation, is 65 to 70%. The left ventricle has normal function. The left ventricle has no regional wall motion abnormalities. The left ventricular internal cavity size was normal in size. There is  severe left ventricular hypertrophy. Indeterminate diastolic filling due to E-A fusion. Right Ventricle: The right ventricular size is normal. No increase in right ventricular wall thickness. Right ventricular systolic function is normal. There is normal pulmonary artery systolic pressure. The tricuspid regurgitant velocity is 2.52 m/s, and  with an assumed right atrial pressure of 3 mmHg, the estimated right ventricular systolic pressure is 28.4 mmHg. Left Atrium: Left atrial size was normal in size. Right Atrium: Right atrial size was normal in size. Pericardium: There is no evidence of pericardial effusion. Mitral Valve: The mitral valve is normal in structure. Trivial mitral valve regurgitation. No evidence of mitral valve stenosis. MV peak gradient, 13.0 mmHg. The mean mitral valve gradient is 4.0 mmHg. Tricuspid  Valve: The tricuspid valve is normal in structure. Tricuspid valve regurgitation is trivial. No evidence of tricuspid stenosis. Aortic Valve: The aortic valve is normal in structure. Aortic valve regurgitation is trivial. No aortic stenosis is present. Aortic valve mean gradient measures 7.0 mmHg. Aortic valve peak gradient measures 13.8 mmHg. Aortic valve area, by VTI measures 3.77 cm. Pulmonic Valve: The pulmonic valve was not well visualized. Pulmonic valve regurgitation is trivial. No evidence of pulmonic stenosis. Aorta: The aortic root is normal in size and structure. Venous: The inferior vena cava is normal in size with greater than 50% respiratory variability, suggesting right atrial pressure of 3 mmHg. IAS/Shunts: No atrial level shunt detected by color flow Doppler.  LEFT VENTRICLE PLAX 2D LVIDd:         4.00 cm     Diastology LVIDs:         2.00 cm     LV e' medial:    4.57 cm/s LV PW:         1.70 cm     LV E/e' medial:  16.3 LV IVS:        1.60 cm     LV e' lateral:   7.83 cm/s LVOT diam:     2.30 cm     LV E/e' lateral: 9.5 LV SV:         106 LV SV Index:   46 LVOT Area:     4.15 cm  LV Volumes (MOD) LV vol d, MOD A2C: 78.9 ml LV vol d, MOD A4C: 93.0 ml LV vol s, MOD A2C: 23.9 ml LV vol s, MOD A4C: 22.9 ml LV SV MOD A2C:     55.0 ml LV SV MOD A4C:     93.0 ml LV SV MOD BP:      63.6 ml RIGHT VENTRICLE RV Basal diam:  3.70 cm RV Mid diam:    2.90 cm RV S prime:     25.20 cm/s TAPSE (M-mode): 3.7 cm LEFT ATRIUM             Index       RIGHT ATRIUM           Index LA diam:        3.20 cm 1.39 cm/m  RA Area:     13.30 cm LA Vol (A2C):   65.6 ml 28.53 ml/m RA Volume:   31.10 ml  13.53 ml/m LA Vol (A4C):   59.8 ml 26.01 ml/m LA Biplane Vol: 63.6 ml 27.66 ml/m  AORTIC VALVE                    PULMONIC VALVE AV Area (Vmax):    3.19 cm     PV Vmax:       1.64 m/s AV Area (Vmean):   3.23 cm     PV Vmean:      113.000 cm/s AV Area (VTI):     3.77 cm     PV VTI:        0.256 m AV Vmax:  186.00  cm/s  PV Peak grad:  10.8 mmHg AV Vmean:          124.000 cm/s PV Mean grad:  6.0 mmHg AV VTI:            0.282 m AV Peak Grad:      13.8 mmHg AV Mean Grad:      7.0 mmHg LVOT Vmax:         143.00 cm/s LVOT Vmean:        96.300 cm/s LVOT VTI:          0.256 m LVOT/AV VTI ratio: 0.91  AORTA Ao Root diam: 3.20 cm Ao Asc diam:  3.20 cm Ao Arch diam: 3.2 cm MITRAL VALVE                TRICUSPID VALVE MV Area (PHT): 4.63 cm     TR Peak grad:   25.4 mmHg MV Area VTI:   3.39 cm     TR Vmax:        252.00 cm/s MV Peak grad:  13.0 mmHg MV Mean grad:  4.0 mmHg     SHUNTS MV Vmax:       1.80 m/s     Systemic VTI:  0.26 m MV Vmean:      94.4 cm/s    Systemic Diam: 2.30 cm MV Decel Time: 164 msec MV E velocity: 74.30 cm/s MV A velocity: 106.00 cm/s MV E/A ratio:  0.70 Weston Brass MD Electronically signed by Weston Brass MD Signature Date/Time: 06/10/2020/11:44:08 AM    Final    CT HEAD CODE STROKE WO CONTRAST  Result Date: 06/09/2020 CLINICAL DATA:  Code stroke.  Rightward gaze.  Left arm weakness. EXAM: CT HEAD WITHOUT CONTRAST TECHNIQUE: Contiguous axial images were obtained from the base of the skull through the vertex without intravenous contrast. COMPARISON:  None. FINDINGS: Brain: An acute hemorrhage centered in the right thalamus measures 3.6 x 2.8 x 2.7 cm (approximate volume of 14 mL). There is intraventricular extension with a small amount of hemorrhage in the right greater than left lateral ventricles, third ventricle, and fourth ventricle. The ventricles are not grossly dilated, however prior imaging is not available to no the patient's baseline ventricle size. There is mild edema surrounding the right thalamic hemorrhage. There is 4 mm of leftward midline shift at the level of the thalami. Hypodensities in the cerebral white matter bilaterally are nonspecific but likely reflect mild chronic small vessel ischemic disease given the patient's vascular risk factors. No acute cortically based infarct or  extra-axial fluid collection is evident. Vascular: No hyperdense vessel. Skull: No fracture or suspicious osseous lesion. Sinuses/Orbits: Minimal mucosal thickening in the right sphenoid sinus. Clear mastoid air cells. Left cataract extraction. Rightward gaze. Other: None. ASPECTS [facility] Stroke Program Early CT Score) Not scored in the presence of acute hemorrhage. IMPRESSION: 1. 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 2. 4 mm of leftward midline shift. 3. Mild chronic small vessel ischemic disease. These results were communicated to Dr. Wilford Corner at 4:17 pm on 06/09/2020 by text page via the [facility] messaging system. Electronically Signed   By: Sebastian Ache M.D.   On: 06/09/2020 16:21   VAS US CAROTID  Result Date: 06/12/2020 Carotid Arterial Duplex Study Patient Name:  Sophia Cubero III  Date of Exam:   06/10/2020 Medical Rec #: [id number]       Accession #:    [id number] Date of Birth: Nov 11, 1965       Patient Gender: Judie Petit  Patient Age:   68Y Exam Location:  [facility] Procedure:      VAS US CAROTID Referring Phys: [id number] Marvel Plan --------------------------------------------------------------------------------  Indications:       CVA. Risk Factors:      Hypertension, hyperlipidemia. Limitations        Today's exam was limited due to the body habitus of the                    patient, the patient's respiratory variation, patient                    movement, patient somnolence and a central line. Comparison Study:  No prior studies. Performing Technologist: Chanda Busing RVT  Examination Guidelines: A complete evaluation includes B-mode imaging, spectral Doppler, color Doppler, and power Doppler as needed of all accessible portions of each vessel. Bilateral testing is considered an integral part of a complete examination. Limited examinations for reoccurring indications may be performed as noted.  Right Carotid Findings:  +----------+--------+--------+--------+-----------------------+--------+           PSV cm/sEDV cm/sStenosisPlaque Description     Comments +----------+--------+--------+--------+-----------------------+--------+ CCA Prox  101     12                                              +----------+--------+--------+--------+-----------------------+--------+ CCA Distal82      11              smooth and heterogenous         +----------+--------+--------+--------+-----------------------+--------+ ICA Prox  58      18              smooth and heterogenous         +----------+--------+--------+--------+-----------------------+--------+ ICA Distal57      17                                     tortuous +----------+--------+--------+--------+-----------------------+--------+ ECA       136     19                                              +----------+--------+--------+--------+-----------------------+--------+ +----------+--------+-------+--------+-------------------+           PSV cm/sEDV cmsDescribeArm Pressure (mmHG) +----------+--------+-------+--------+-------------------+ Subclavian120                                        +----------+--------+-------+--------+-------------------+ +---------+--------+--+--------+--+---------+ VertebralPSV cm/s56EDV cm/s13Antegrade +---------+--------+--+--------+--+---------+  Left Carotid Findings: +----------+--------+--------+--------+-----------------------+--------+           PSV cm/sEDV cm/sStenosisPlaque Description     Comments +----------+--------+--------+--------+-----------------------+--------+ CCA Prox  58      8               smooth and heterogenous         +----------+--------+--------+--------+-----------------------+--------+ CCA Distal69      11              smooth and heterogenous         +----------+--------+--------+--------+-----------------------+--------+ ICA Prox  69      20  smooth and heterogenoustortuous +----------+--------+--------+--------+-----------------------+--------+ ICA Distal112     34                                     tortuous +----------+--------+--------+--------+-----------------------+--------+ ECA       99      14                                              +----------+--------+--------+--------+-----------------------+--------+ +----------+--------+--------+--------+-------------------+           PSV cm/sEDV cm/sDescribeArm Pressure (mmHG) +----------+--------+--------+--------+-------------------+ [id number]                                         +----------+--------+--------+--------+-------------------+ +---------+--------+--+--------+--+---------+ VertebralPSV cm/s41EDV cm/s10Antegrade +---------+--------+--+--------+--+---------+   Summary: Right Carotid: Velocities in the right ICA are consistent with a 1-39% stenosis. Left Carotid: Velocities in the left ICA are consistent with a 1-39% stenosis. Vertebrals: Bilateral vertebral arteries demonstrate antegrade flow. *See table(s) above for measurements and observations.  Electronically signed by Delia Heady MD on 06/12/2020 at 2:03:49 PM.    Final    [place] EKG SITE RITE  Result Date: 06/09/2020 If Site Rite image not attached, placement could not be confirmed due to current cardiac rhythm.   PHYSICAL EXAM  Temp:  [97.7 F (36.5 C)-98.6 F (37 C)] 98.3 F (36.8 C) (05/28 1100) Pulse Rate:  [65-87] 82 (05/28 1012) Resp:  [14-25] 19 (05/28 0907) BP: (91-169)/(49-103) 162/70 (05/28 1012) SpO2:  [89 %-100 %] 94 % (05/28 0406) Weight:  [109.3 kg-109.5 kg] 109.5 kg (05/28 0606)  General -obese middle-aged African-American male, drowsy sleepy but arousable  Ophthalmologic - fundi not visualized due to noncooperation.  Cardiovascular - Regular rhythm and rate.  Neuro - drowsy sleepy but arousable with voice stimulation, with eyes open he orientated to  place and people, but not to age and time. Perseveration, but no aphasia, paucity of speech, able to name, following all simple commands. Able. No gaze palsy, tracking bilaterally, blinking to visual threat bilaterally, PERRL. Left facial droop. Tongue midline. RUE at least 4/5 and RLE at least 3/5, LUE 1/5 and LLE 2-/5. Sensation symmetrical bilaterally, right FTN intact, gait not tested.       ASSESSMENT/PLAN Mr. Witt Plitt III is a 55 y.o. male with history of hypertension, obesity, cardiomyopathy, hyperlipidemia, CHF admitted for right-sided gaze, right-sided weakness numbness, fall. No tPA given due to ICH.    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  2D Echo EF 65-70%  CUS unremarkable  LDL 78  HgbA1c 5.5  UDS negative  SCDs for VTE prophylaxis  No antithrombotic prior to admission, now on No antithrombotic due to ICH  Ongoing aggressive stroke risk factor management  Therapy recommendations: Pending  Disposition: Pending  Seizure   Likely partial complex seizure 5/27  CT head stable  evolving ICH and IVH and mild hydrocephalus  Spot EEG no seizure recorded  On keppra now  Received depakote 5/26 evening for HA, depakote level 45 - no depakote for seizure given side effect of weight gain.  LTM EEG no seizure, continue for now  Seizure precautions  Hypertensive emergency . Home medication metoprolol 100 twice daily, HCTZ, losartan . Stable on the high end . BP goal < 160 . Off Cleviprex  . amlodipine 10, labetalol 100 Bid, losartan 50 bid, clonidine  0.2 TId  Long term BP goal normotensive  Hyperlipidemia  Home meds: Lipitor 40  LDL 78, goal < 70  Now on no statin given recent ICH  Resume statin at discharge  AKI  Cre 0.9->1.68->1.19->1.92-<1.41  Encourage po intake   BMP monitoring  Dysphagia  Speech on board  dys 3 and thin liquid  Aspiration precaution  Alcohol abuse  About 24 drinks per week  On CIWA protocol  FA/MVI/B1  Other Stroke Risk Factors  Obesity, Body mass index is 34.64 kg/m.   Likely obstructive sleep apnea, undiagnosed  Other Active Problems    This patient is critically ill due to seizure, right ICH with IVH, hypertensive emergency and at significant risk of neurological worsening, death form status epilepticus, hematoma expansion, obstructive hydrocephalus, hypertensive encephalopathy. This patient's care requires constant monitoring of vital signs, hemodynamics, respiratory and cardiac monitoring, review of multiple databases, neurological assessment, discussion with family, other specialists and medical decision making of high complexity. I spent 45 minutes of neurocritical care time in the care of this patient. I had long discussion with wife at bedside, updated pt current condition, treatment plan and potential prognosis, and answered all the questions. Wife expressed understanding and appreciation.   Marvel Plan, MD PhD Stroke Neurology 06/19/2020 12:17 PM    To contact Stroke Continuity provider, please refer to [email or web address]. After hours, contact General Neurology

## 2020-06-19 NOTE — Progress Notes (Signed)
NAME:  [name], MRN:  [id number], DOB:  [birth year], LOS: 10 ADMISSION DATE:  06/09/2020, CONSULTATION DATE:  06/18/20 REFERRING MD:  Amada Jupiter - neuro, CHIEF COMPLAINT:  AMS   History of Present Illness:  55 yo M PMH non-ischemic dilated cardiomyopathy, HLD,  diastolic heart failure, HTN, obesity admitted 06/09/20 as code stroke -- found lying in bathroom with R gaze preference R wkness, and numbness. SBPs 200-230 on presentation. Found to have cerebellar stroke (R thalamic bleed extending into ventricles with 26mm shift){, admitted to neuro. During this course, has rcvd 3%, and hx course fairly unremarkable. On 5/27 pt had worsening encephalopathy and lethargy -- had received VPA for headache but no other sedating meds on this day. Prior days has received some ativan. CT h 5/27 similar sz R thalamic hemorrhage without significant change from prior study.  PCCM consulted for evaluation in this setting.  Notable labs at time of consultation   5/27 ABG- 7.3/39/65     BMP Na 131 K 5.4 Cl 95 CO2 23 Glu 117 BUN 38 Cr 1.92 AG 10 Phos 5.6 Cr 2.6  Pertinent  Medical History  Obesity diastolic HF NICM  HTN HLD EtOH use   Significant Hospital Events: Including procedures, antibiotic start and stop dates in addition to other pertinent events   . 5/18 admitted for R thalamic bleed. 3% initiated . 5/23 transferred out of neuro ICU and recommended for CIR. EtOH withdrawal managed with Ativan  . 5/26 started on VPA for HA  . 5/27 CCM consulted for worsening encephalopathy and concern for airway protection.   Interim History / Subjective:   Still has room ongoing fluctuating mental status.  Following commands this morning.  On LTV EEG.  Objective   Blood pressure (!) 120/103, pulse 79, temperature 97.7 F (36.5 C), resp. rate 19, height 5\' 10"  (1.778 m), weight 109.5 kg, SpO2 94 %.        Intake/Output Summary (Last 24 hours) at 06/19/2020 06/21/2020 Last data filed at 06/19/2020 0600 Gross  per 24 hour  Intake 345.83 ml  Output 1450 ml  Net -1104.17 ml   Filed Weights   06/09/20 2000 06/18/20 2130 06/19/20 0606  Weight: 113.9 kg 109.3 kg 109.5 kg    Examination: General: Chronically ill-appearing gentleman lying in bed, on LTV EEG HENT: NCAT, tracking appropriately, extraocular in place Lungs: Nonlabored breathing, clear, diminished bilaterally Cardiovascular: Regular rate rhythm, S1-S2 Abdomen: Soft, nontender nondistended, bowel sounds present Extremities: No significant edema Neuro: Following commands will move extremities.  Left side weakness, left-sided facial droop, left preferred gaze GU: defer   Labs/imaging that I havepersonally reviewed  (right click and "Reselect all SmartList Selections" daily)  CT h 5/27 similar sz R thalamic hemorrhage without significant change from prior study.  Sodium 132 Serum creatinine 1.41 BUN 38  Resolved Hospital Problem list   Hypertensive emergency   Assessment & Plan:   Acute encephalopathy, likely multifactorial Possible postictal state -protecting airway currently, at risk respiratory failure -VPA level not high, ammonia WNL. Mildly elevated BUN (38) Na 128 but doubt this accounts for mental status change P Neurochecks Delirium precautions EEG continuous Appreciate neurology input Close observation in ICU for concern of intubation need  HA/migraine P On valproic acid  Acute R thalamic ICH with IVH, 64mm shift  P Back on VTE prophylaxis Stroke team  HTN P Continue amlodipine losartan clonidine labetalol If kidney function gets any worse would hold losartan.  Looks like it has leveled off.  AKI Cr  1.18 on 5/23 to 1.92 on 5/27 P Follow urine output Low threshold to stop losartan but serum creatinine coming down.  Hyperkalemia -AKI above  Hyponatremia -likely in setting of HCTZ  P Hydrochlorothiazide held due to hyponatremia Follow potassium  EtOH use disorder P Multivitamin  remains   Best practice (right click and "Reselect all SmartList Selections" daily)  Diet:  Oral, advance as tolerated per slp recs  Pain/Anxiety/Delirium protocol (if indicated): No VAP protocol (if indicated): Not indicated DVT prophylaxis: LMWH GI prophylaxis: N/A and PPI Glucose control:  SSI No Central venous access:  N/A Arterial line:  N/A Foley:  N/A Mobility:  bed rest  PT consulted: Yes Last date of multidisciplinary goals of care discussion [per primary] Code Status:  full code Disposition: transferring to ICU   Labs   CBC: Recent Labs  Lab 06/13/20 0412 06/14/20 0516 06/18/20 1823  WBC 7.5 7.0 9.0  NEUTROABS  --   --  7.3  HGB 11.2* 11.3* 12.8*  HCT 33.9* 33.7* 37.9*  MCV 94.4 93.6 91.8  PLT 235 239 488*    Basic Metabolic Panel: Recent Labs  Lab 06/12/20 0953 06/13/20 0412 06/14/20 0516 06/18/20 1823 06/19/20 0552  NA 141 134* 131* 128* 132*  K  --  3.6 4.0 5.4* 4.4  CL  --  102 96* 95* 98  CO2  --  26 26 23 24   GLUCOSE  --  119* 127* 117* 84  BUN  --  10 18 38* 38*  CREATININE  --  1.13 1.18 1.92* 1.41*  CALCIUM  --  8.7* 9.0 9.5 9.6  MG  --   --   --  2.6*  --   PHOS  --   --   --  5.6*  --    GFR: Estimated Creatinine Clearance: 72.5 mL/min (A) (by C-G formula based on SCr of 1.41 mg/dL (H)). Recent Labs  Lab 06/13/20 0412 06/14/20 0516 06/18/20 1823  WBC 7.5 7.0 9.0    Liver Function Tests: Recent Labs  Lab 06/18/20 1823  AST 25  ALT 31  ALKPHOS 54  BILITOT 1.1  PROT 7.5  ALBUMIN 3.7   No results for input(s): LIPASE, AMYLASE in the last 168 hours. Recent Labs  Lab 06/18/20 1823  AMMONIA 23    ABG    Component Value Date/Time   PHART 7.392 06/18/2020 1730   PCO2ART 39.0 06/18/2020 1730   PO2ART 65.6 (L) 06/18/2020 1730   HCO3 23.2 06/18/2020 1730   TCO2 26 06/09/2020 1608   ACIDBASEDEF 1.0 06/18/2020 1730   O2SAT 91.6 06/18/2020 1730     Coagulation Profile: No results for input(s): INR, PROTIME in the last  168 hours.  Cardiac Enzymes: No results for input(s): CKTOTAL, CKMB, CKMBINDEX, TROPONINI in the last 168 hours.  HbA1C: Hgb A1c MFr Bld  Date/Time Value Ref Range Status  06/09/2020 06:09 PM 5.5 4.8 - 5.6 % Final    Comment:    (NOTE) Pre diabetes:          5.7%-6.4%  Diabetes:              >6.4%  Glycemic control for   <7.0% adults with diabetes   06/27/2016 03:54 PM 5.4 4.6 - 6.5 % Final    Comment:    Glycemic Control Guidelines for People with Diabetes:Non Diabetic:  <6%Goal of Therapy: <7%Additional Action Suggested:  >8%     CBG: Recent Labs  Lab 06/18/20 1615  GLUCAP 148*    Review of  Systems:   Unable to obtain due to encephalopathy  Past Medical History:  He,  has a past medical history of Chronic combined systolic and diastolic CHF (congestive heart failure) (HCC), Diastolic dysfunction, Dyslipidemia, Hyperlipidemia, Hypertension, Nonischemic dilated cardiomyopathy (HCC), and Shortness of breath.   Surgical History:   Past Surgical History:  Procedure Laterality Date  . KNEE ARTHROSCOPY Left 04/13/2016   Procedure: ARTHROSCOPY KNEE WITH DEBRIDEMENT;  Surgeon: Valeria Batman, MD;  Location: [facility];  Service: Orthopedics;  Laterality: Left;  . KNEE ARTHROSCOPY WITH MEDIAL MENISECTOMY Right 04/13/2016   Procedure: KNEE ARTHROSCOPY WITH MEDIAL MENISECTOMY;  Surgeon: Valeria Batman, MD;  Location: [facility];  Service: Orthopedics;  Laterality: Right;  . NASAL FRACTURE SURGERY       Social History:   reports that he has never smoked. He has never used smokeless tobacco. He reports current alcohol use of about 24.0 standard drinks of alcohol per week. He reports that he does not use drugs.   Family History:  His family history includes CAD in his mother; Heart attack in his maternal grandmother and mother; Hypertension in his father and mother.   Allergies No Known Allergies   Home Medications  Prior to Admission  medications   Medication Sig Start Date End Date Taking? Authorizing Provider  atorvastatin (LIPITOR) 40 MG tablet Take 1 tablet (40 mg total) by mouth daily at 6 PM. 12/22/19  Yes Lamptey, Britta Mccreedy, MD  hydrochlorothiazide (HYDRODIURIL) 12.5 MG tablet Take 1 tablet (12.5 mg total) by mouth every other day. 12/22/19  Yes Lamptey, Britta Mccreedy, MD  ibuprofen (ADVIL) 200 MG tablet Take 800 mg by mouth every 6 (six) hours as needed for moderate pain.   Yes [provider]  metoprolol tartrate (LOPRESSOR) 100 MG tablet Take 1 tablet (100 mg total) by mouth 2 (two) times daily. 12/22/19  Yes Lamptey, Britta Mccreedy, MD  Multiple Vitamin (MULTIVITAMIN WITH MINERALS) TABS tablet Take 1 tablet by mouth daily.   Yes [provider]  losartan (COZAAR) 50 MG tablet Take 1 tablet (50 mg total) by mouth daily. 12/22/19   Lamptey, Britta Mccreedy, MD  predniSONE (DELTASONE) 10 MG tablet 6 tabs po day 1, 5 tabs po day 2, 4 tabs po day 3, 3 tabs po day 4, 2 tabs po day 5, 1 tab po day 6 Patient not taking: No sig reported 05/24/20   Hudnall, Azucena Fallen, MD     This patient is critically ill with multiple organ system failure; which, requires frequent high complexity decision making, assessment, support, evaluation, and titration of therapies. This was completed through the application of advanced monitoring technologies and extensive interpretation of multiple databases. During this encounter critical care time was devoted to patient care services described in this note for 32 minutes.  Josephine Igo, DO [facility] 06/19/2020 9:22 AM

## 2020-06-20 DIAGNOSIS — I615 Nontraumatic intracerebral hemorrhage, intraventricular: Secondary | ICD-10-CM | POA: Diagnosis not present

## 2020-06-20 DIAGNOSIS — I610 Nontraumatic intracerebral hemorrhage in hemisphere, subcortical: Secondary | ICD-10-CM | POA: Diagnosis not present

## 2020-06-20 DIAGNOSIS — R569 Unspecified convulsions: Secondary | ICD-10-CM | POA: Diagnosis not present

## 2020-06-20 LAB — CBC
HCT: 37.8 % — ABNORMAL LOW (ref 39.0–52.0)
Hemoglobin: 12.4 g/dL — ABNORMAL LOW (ref 13.0–17.0)
MCH: 30.8 pg (ref 26.0–34.0)
MCHC: 32.8 g/dL (ref 30.0–36.0)
MCV: 93.8 fL (ref 80.0–100.0)
Platelets: 466 10*3/uL — ABNORMAL HIGH (ref 150–400)
RBC: 4.03 MIL/uL — ABNORMAL LOW (ref 4.22–5.81)
RDW: 11.8 % (ref 11.5–15.5)
WBC: 7 10*3/uL (ref 4.0–10.5)
nRBC: 0 % (ref 0.0–0.2)

## 2020-06-20 LAB — BASIC METABOLIC PANEL
Anion gap: 8 (ref 5–15)
BUN: 32 mg/dL — ABNORMAL HIGH (ref 6–20)
CO2: 24 mmol/L (ref 22–32)
Calcium: 9.3 mg/dL (ref 8.9–10.3)
Chloride: 101 mmol/L (ref 98–111)
Creatinine, Ser: 1.38 mg/dL — ABNORMAL HIGH (ref 0.61–1.24)
GFR, Estimated: >60 mL/min (ref >60)
Glucose, Bld: 99 mg/dL (ref 70–99)
Potassium: 4.4 mmol/L (ref 3.5–5.1)
Sodium: 133 mmol/L — ABNORMAL LOW (ref 135–145)

## 2020-06-20 MED ORDER — POLYVINYL ALCOHOL 1.4 % OP SOLN
1.0000 [drp] | Freq: Four times a day (QID) | OPHTHALMIC | Status: DC | PRN
  Filled 2020-06-20: qty 15

## 2020-06-20 MED ORDER — QUETIAPINE FUMARATE 25 MG PO TABS
25.0000 mg | ORAL_TABLET | Freq: Every day | ORAL | Status: DC
  Administered 2020-06-20 – 2020-06-21 (×2): 25 mg via ORAL
  Filled 2020-06-20 (×2): qty 1

## 2020-06-20 MED ORDER — CLONIDINE HCL 0.1 MG PO TABS
0.1000 mg | ORAL_TABLET | Freq: Two times a day (BID) | ORAL | Status: DC
  Administered 2020-06-20 – 2020-06-21 (×3): 0.1 mg via ORAL
  Filled 2020-06-20 (×3): qty 1

## 2020-06-20 NOTE — Progress Notes (Signed)
PCCM:  We will sign off. No ccm needs. Case discussed with Dr. Roda Shutters Plans to transfer from ICU   Josephine Igo, DO [facility] 06/20/2020 11:29 AM

## 2020-06-20 NOTE — Progress Notes (Signed)
EEG  Discontinued , no skin break seen - results pending

## 2020-06-20 NOTE — Procedures (Signed)
Patient Name:[name] PJA:[id number] Epilepsy Attending:Jenel Gierke Annabelle Harman Referring Physician/Provider:Dr Delia Heady Duration:06/19/2020 2040 to 06/20/2020 0930  Patient history:54yo M with acuteneurological change in his level of consciousness with leftward head and eye deviation. EEG to evaluateseizure activity.  Level of alertness:Awake, asleep  AEDs during EEG study:LEV  Technical aspects: This EEG study was done with scalp electrodes positioned according to the 10-20 International system of electrode placement. Electrical activity was acquired at a sampling rate of 500Hz  and reviewed with a high frequency filter of 70Hz  and a low frequency filter of 1Hz . EEG data were recorded continuously and digitally stored.   Description:Noposterior dominant rhythm was seen. Sleep was characterized by sleep spindles (12-14hz ), maximal frontocenral region. EEG showed continuousgeneralized and lateralized right hemisphere3 to 6 Hz theta-delta slowing admixed with generalized 15-18Hz  beta activity. Hyperventilation and photic stimulation were not performed.   After 4 am on 06/20/2020, EEG had significant movement and electrode artifact.   ABNORMALITY - Continuous slow, generalizedand lateralized right hemisphere  IMPRESSION: This technically difficult study is suggestive of suggestive of cortical dysfunction arising from right hemispherelikely secondary to underlying bleed as well asmoderate diffuse encephalopathy, nonspecific etiology.No seizures ordefiniteepileptiform discharges were seen throughout the recording.   [name] 

## 2020-06-20 NOTE — Progress Notes (Signed)
Went down for Head CT.  2145-Back to room.Tolerated procedure.

## 2020-06-20 NOTE — Progress Notes (Signed)
STROKE TEAM PROGRESS NOTE   SUBJECTIVE (INTERVAL HISTORY) RN and wife and son in law are at the bedside. Pt sitting up in bed for lunch. He was sleepy but awake easily, orientated and following simple commands. BP 150s. EEG no seizure, d/c LTM EEG. Last night he had sundowning and delirium, repeat CT last evening stable.    OBJECTIVE Temp:  [97.9 F (36.6 C)-99 F (37.2 C)] 97.9 F (36.6 C) (05/29 0700) Pulse Rate:  [68-99] 89 (05/29 0916) Cardiac Rhythm: Normal sinus rhythm (05/29 0400) Resp:  [13-21] 17 (05/29 0700) BP: (108-206)/(54-137) 167/91 (05/29 0916) SpO2:  [95 %-96 %] 96 % (05/29 0630) Weight:  [106.1 kg] 106.1 kg (05/29 0500)  Recent Labs  Lab 06/18/20 1615 06/19/20 1709  GLUCAP 148* 97   Recent Labs  Lab 06/14/20 0516 06/18/20 1823 06/19/20 0552 06/20/20 0133  NA 131* 128* 132* 133*  K 4.0 5.4* 4.4 4.4  CL 96* 95* 98 101  CO2 26 23 24 24   GLUCOSE 127* 117* 84 99  BUN 18 38* 38* 32*  CREATININE 1.18 1.92* 1.41* 1.38*  CALCIUM 9.0 9.5 9.6 9.3  MG  --  2.6*  --   --   PHOS  --  5.6*  --   --    Recent Labs  Lab 06/18/20 1823  AST 25  ALT 31  ALKPHOS 54  BILITOT 1.1  PROT 7.5  ALBUMIN 3.7   Recent Labs  Lab 06/14/20 0516 06/18/20 1823 06/20/20 0133  WBC 7.0 9.0 7.0  NEUTROABS  --  7.3  --   HGB 11.3* 12.8* 12.4*  HCT 33.7* 37.9* 37.8*  MCV 93.6 91.8 93.8  PLT 239 488* 466*   No results for input(s): CKTOTAL, CKMB, CKMBINDEX, TROPONINI in the last 168 hours. No results for input(s): LABPROT, INR in the last 72 hours. Recent Labs    06/18/20 1959  COLORURINE YELLOW  LABSPEC 1.014  PHURINE 5.0  GLUCOSEU NEGATIVE  HGBUR NEGATIVE  BILIRUBINUR NEGATIVE  KETONESUR NEGATIVE  PROTEINUR NEGATIVE  NITRITE NEGATIVE  LEUKOCYTESUR NEGATIVE       Component Value Date/Time   CHOL 196 06/09/2020 1809   TRIG 153 (H) 06/12/2020 0420   HDL 52 06/09/2020 1809   CHOLHDL 3.8 06/09/2020 1809   VLDL 66 (H) 06/09/2020 1809   LDLCALC 78 06/09/2020  1809   Lab Results  Component Value Date   HGBA1C 5.5 06/09/2020      Component Value Date/Time   LABOPIA NONE DETECTED 06/09/2020 2256   COCAINSCRNUR NONE DETECTED 06/09/2020 2256   LABBENZ NONE DETECTED 06/09/2020 2256   AMPHETMU NONE DETECTED 06/09/2020 2256   THCU NONE DETECTED 06/09/2020 2256   LABBARB NONE DETECTED 06/09/2020 2256    No results for input(s): ETH in the last 168 hours.  I have personally reviewed the radiological images below and agree with the radiology interpretations.  CT HEAD WO CONTRAST  Result Date: 06/19/2020 CLINICAL DATA:  History of right thalamic hemorrhage EXAM: CT HEAD WITHOUT CONTRAST TECHNIQUE: Contiguous axial images were obtained from the base of the skull through the vertex without intravenous contrast. COMPARISON:  06/18/2020 FINDINGS: Brain: The known right thalamic hemorrhage seen on prior study is grossly unchanged on this exam. There is mild increased surrounding edema, with persistent mass effect upon the third ventricle. Stable mild ventriculomegaly with minimal blood products layering dependently within the occipital horns of the lateral ventricles. No frank hydrocephalus at this time. No acute extra-axial fluid collections. Vascular: No hyperdense vessel or unexpected  calcification. Skull: Normal. Negative for fracture or focal lesion. Sinuses/Orbits: No acute finding. Other: None. IMPRESSION: 1. Stable size of the right thalamic hemorrhage, with minimal increase in surrounding edema. 2. Stable mass effect upon the third ventricle, with stable mild dilation of the lateral ventricles without frank hydrocephalus. 3. Stable decompression of the thalamic hemorrhage into the ventricular system, with minimal blood products layering dependently within the occipital horns as before. Electronically Signed   By: Sharlet Salina M.D.   On: 06/19/2020 21:43   CT Head Wo Contrast  Result Date: 06/18/2020 CLINICAL DATA:  Stroke follow-up. EXAM: CT HEAD  WITHOUT CONTRAST TECHNIQUE: Contiguous axial images were obtained from the base of the skull through the vertex without intravenous contrast. COMPARISON:  Jun 18, 2019. FINDINGS: Brain: Right thalamic hemorrhage is unchanged in size. Similar intraventricular extension of hemorrhage with small volume of hemorrhage layering in the occipital horns bilaterally. Similar mild ventriculomegaly with mild rounding of the temporal horns. There is similar surrounding edema. Similar leftward midline shift with effacement of the third ventricle. No evidence of acute/interval large vascular territory infarct. Similar patchy white matter hypoattenuation, most likely related to chronic microvascular ischemic disease. Vascular: No hyperdense vessels identified. Skull: No acute fracture. Sinuses/Orbits: Visualized sinuses are largely clear. No acute orbital abnormality. Other: No mastoid effusions. IMPRESSION: 1. Similar size of the right thalamic hemorrhage with similar intraventricular extension. Similar leftward midline shift. 2. Similar mild ventriculomegaly. Recommend continued attention on follow-up. Electronically Signed   By: Feliberto Harts MD   On: 06/18/2020 18:38   CT HEAD WO CONTRAST  Result Date: 06/17/2020 CLINICAL DATA:  Intracranial hemorrhage EXAM: CT HEAD WITHOUT CONTRAST TECHNIQUE: Contiguous axial images were obtained from the base of the skull through the vertex without intravenous contrast. COMPARISON:  CT head 06/11/2020 FINDINGS: Brain: Right thalamic hemorrhage unchanged in size. Mild intraventricular hemorrhage shows partial clearing. Mild midline shift to the left is unchanged. Mild ventricular dilatation has progressed in the interval. Patchy white matter hypodensity bilaterally most consistent with chronic microvascular ischemia. No acute ischemic infarct. Vascular: Negative for hyperdense vessel Skull: Negative Sinuses/Orbits: Mild mucosal edema paranasal sinuses. Left cataract extraction. Chronic  nasal bone fracture unchanged. Other: None IMPRESSION: Right thalamic hematoma stable Partial clearing of intraventricular hemorrhage. Mild ventricular enlargement, with progression since the prior study. Electronically Signed   By: Marlan Palau M.D.   On: 06/17/2020 09:18   CT HEAD WO CONTRAST  Result Date: 06/11/2020 CLINICAL DATA:  Left arm weakness. Intracranial hemorrhage follow-up EXAM: CT HEAD WITHOUT CONTRAST TECHNIQUE: Contiguous axial images were obtained from the base of the skull through the vertex without intravenous contrast. COMPARISON:  Head CT 06/10/2020 FINDINGS: Brain: Unchanged intraparenchymal hemorrhage centered in the right thalamus with intraventricular extension. Size and configuration of the ventricles is unchanged. Mild leftward midline shift is also unchanged. No new site of hemorrhage. Vascular: No abnormal hyperdensity of the major intracranial arteries or dural venous sinuses. No intracranial atherosclerosis. Skull: The visualized skull base, calvarium and extracranial soft tissues are normal. Sinuses/Orbits: No fluid levels or advanced mucosal thickening of the visualized paranasal sinuses. No mastoid or middle ear effusion. The orbits are normal. IMPRESSION: 1. Unchanged size and configuration of right thalamic intraparenchymal hemorrhage with intraventricular extension. 2. Unchanged mild leftward midline shift. Electronically Signed   By: Deatra Robinson M.D.   On: 06/11/2020 18:49   CT HEAD WO CONTRAST  Result Date: 06/09/2020 CLINICAL DATA:  Intracranial hemorrhage follow EXAM: CT HEAD WITHOUT CONTRAST TECHNIQUE: Contiguous axial images were  obtained from the base of the skull through the vertex without intravenous contrast. COMPARISON:  06/09/2020 at 4:10 p.m. FINDINGS: Brain: Unchanged appearance of intracranial hemorrhage in the right thalamus with extension into the ventricles. 4 mm leftward midline shift is also unchanged. Vascular: No abnormal hyperdensity of the  major intracranial arteries or dural venous sinuses. No intracranial atherosclerosis. Skull: The visualized skull base, calvarium and extracranial soft tissues are normal. Sinuses/Orbits: No fluid levels or advanced mucosal thickening of the visualized paranasal sinuses. No mastoid or middle ear effusion. The orbits are normal. IMPRESSION: 1. Unchanged appearance of right thalamic intracranial hemorrhage with extension into the ventricles. 2. Unchanged 4 mm leftward midline shift. Electronically Signed   By: Deatra RobinsonKevin  Herman M.D.   On: 06/09/2020 22:18   CT SOFT TISSUE NECK WO CONTRAST  Result Date: 06/09/2020 CLINICAL DATA:  Foreign body in the neck. EXAM: CT NECK WITHOUT CONTRAST TECHNIQUE: Multidetector CT imaging of the neck was performed following the standard protocol without intravenous contrast. COMPARISON:  None. FINDINGS: PHARYNX AND LARYNX: The nasopharynx, oropharynx and larynx are normal. Visible portions of the oral cavity, tongue base and floor of mouth are normal. Normal epiglottis, vallecula and pyriform sinuses. The larynx is normal. No retropharyngeal abscess, effusion or lymphadenopathy. SALIVARY GLANDS: Normal parotid, submandibular and sublingual glands. THYROID: Normal. LYMPH NODES: No enlarged or abnormal density lymph nodes. VASCULAR: Major cervical vessels are patent. Left IJ central venous catheter. LIMITED INTRACRANIAL: Normal. VISUALIZED ORBITS: Normal. MASTOIDS AND VISUALIZED PARANASAL SINUSES: No fluid levels or advanced mucosal thickening. No mastoid effusion. SKELETON: No bony spinal canal stenosis. No lytic or blastic lesions. UPPER CHEST: Clear. OTHER: None. IMPRESSION: Normal CT of the neck. Electronically Signed   By: Deatra RobinsonKevin  Herman M.D.   On: 06/09/2020 22:22   CT CHEST WO CONTRAST  Result Date: 06/09/2020 CLINICAL DATA:  Mediastinal widening on chest radiograph, possible foreign body EXAM: CT CHEST WITHOUT CONTRAST TECHNIQUE: Multidetector CT imaging of the chest was  performed following the standard protocol without IV contrast. COMPARISON:  Radiograph 06/09/2020, 01/02/2018, contemporary neck radiograph FINDINGS: Cardiovascular: Left IJ approach central venous catheter tip terminates near the level of the left brachiocephalic-superior caval junction. Cardiac size is top normal no sizable pericardial effusion. Atherosclerotic plaque within the normal caliber aorta. No hyperdense mural thickening to suggest an intra mural hematoma. No periaortic stranding or hemorrhage is evident on this unenhanced exam. Central pulmonary arteries are normal caliber. Luminal evaluation of the vasculature is precluded in the absence of contrast media. Mediastinum/Nodes: Some pronounced upper mediastinal widening seen on the chest radiograph is likely a combination of portable technique and abundant mediastinal fat noted on this exam. No mediastinal fluid or gas. Normal thyroid gland and thoracic inlet. No acute abnormality of the trachea or esophagus. No worrisome mediastinal or axillary adenopathy. Hilar nodal evaluation is limited in the absence of intravenous contrast media. Lungs/Pleura: Low lung volumes and atelectasis with more bandlike subsegmental atelectasis towards the lung bases. No effusion. No pneumothorax. Upper Abdomen: No acute abnormalities present in the visualized portions of the upper abdomen. Musculoskeletal: No acute osseous abnormality or suspicious osseous lesion. Degenerative changes are present in the imaged spine and shoulders. No worrisome chest wall mass. No unexpected radiodense foreign bodies. IMPRESSION: 1. No expected radiopaque foreign body seen in the chest. 2. Left IJ approach central venous catheter tip terminates at the left brachiocephalic-superior caval junction. 3. Mediastinal widening present on comparison chest radiograph is likely a combination artifactual accentuation due to portable technique and abundant mediastinal  fat noted on this exam. No  worrisome aortic dilatation, or other acute aortic abnormality within the limitations of an unenhanced CT. 4. Basilar atelectatic changes. Electronically Signed   By: Kreg Shropshire M.D.   On: 06/09/2020 23:26   MR ANGIO HEAD WO CONTRAST  Result Date: 06/10/2020 CLINICAL DATA:  Cerebral hemorrhage. EXAM: MRA HEAD WITHOUT CONTRAST TECHNIQUE: Angiographic images of the Circle of Willis were acquired using MRA technique without intravenous contrast. COMPARISON:  No pertinent prior exam. FINDINGS: Motion limited exam.  Within this limitation: Anterior circulation: No large vessel occlusion or evidence of proximal hemodynamically significant stenosis. No evidence of hemodynamically significant proximal stenosis, although evaluation limited by motion. No aneurysm identified. No evidence of vascular malformation in the region of the right thalamic hemorrhage, although acute blood products limit evaluation. There is intrinsic T1 hyperintensity within the hemorrhage, which also limits evaluation. Posterior circulation: No large vessel occlusion or evidence of proximal hemodynamically significant stenosis. Bilateral posterior communicating arteries. No aneurysm. IMPRESSION: 1. No evidence of a vascular malformation in the region of the right thalamic hemorrhage, although acute blood products limit evaluation. 2. No large vessel occlusion or evidence of proximal hemodynamically significant stenosis on this motion limited exam. Electronically Signed   By: Feliberto Harts MD   On: 06/10/2020 12:02   MR BRAIN W WO CONTRAST  Result Date: 06/10/2020 CLINICAL DATA:  Hemorrhage. EXAM: MRI HEAD WITHOUT AND WITH CONTRAST TECHNIQUE: Multiplanar, multiecho pulse sequences of the brain and surrounding structures were obtained without and with intravenous contrast. CONTRAST:  10mL GADAVIST GADOBUTROL 1 MMOL/ML IV SOLN COMPARISON:  CT Jun 09, 2020. FINDINGS: Mildly motion limited exam. Brain: When comparing across modalities, no  substantial change in size/extent of an acute right thalamic hemorrhage, measuring up to approximately 3.8 by 2.4 cm. The hemorrhage demonstrates areas of intrinsic T1 hyperintensity. Similar extraventricular extension with blood products in right greater than left lateral ventricles, third ventricle, and fourth ventricle. No progressive ventriculomegaly. Surrounding edema with similar 4 mm of leftward midline shift. Outside of the hemorrhage itself, and no confluent restricted diffusion to suggest acute infarct. No visible mass lesion, although acute blood products limit evaluation. Moderate patchy T2/FLAIR hyperintensities within the white matter and pons, nonspecific but most likely related to chronic microvascular ischemic disease. Vascular: Further evaluated on concurrent MRA. Skull and upper cervical spine: Normal marrow signal. Sinuses/Orbits: Clear sinuses.  No acute orbital finding. Other: No mastoid effusions. IMPRESSION: 1. When comparing across modalities, no substantial change in size/extent of an acute right thalamic hemorrhage with intraventricular extension, as detailed above. Similar 4 mm of leftward midline shift. No progressive ventriculomegaly. 2. No specific evidence of acute infarct or visible mass lesion, although acute blood products limits evaluation. A follow-up MRI after resolution of hemorrhage could further evaluate if clinically indicated. 3. Moderate chronic microvascular ischemic disease. Electronically Signed   By: Feliberto Harts MD   On: 06/10/2020 11:51   DG Chest Portable 1 View  Result Date: 06/09/2020 CLINICAL DATA:  55 year old male with history of central line placement. EXAM: PORTABLE CHEST 1 VIEW COMPARISON:  Chest x-ray 01/02/2018. FINDINGS: Left-sided internal jugular central venous catheter with tip terminating near the junction of the innominate vein with the superior vena cava. Lung volumes are low. No consolidative airspace disease. No pleural effusions. No  pneumothorax. No evidence of pulmonary edema. Heart size is mildly enlarged. Marked widening of the mediastinum, new compared to the prior examination. IMPRESSION: 1. Left internal jugular central venous catheter in position, as above,  with profound widening of the mediastinum. If there is clinical concern for aortic dissection and/or mediastinal hematoma, further evaluation with chest CTA would be recommended. Electronically Signed   By: Trudie Reed M.D.   On: 06/09/2020 18:11   EEG adult  Result Date: 06/18/2020 Charlsie Quest, MD     06/18/2020 10:26 PM Patient Name: Burle Kwan [name] MRN: [id number] Epilepsy Attending: Charlsie Quest Referring Physician/Provider: Dr Delia Heady Date: 06/18/2020 Duration: 21.18 mins Patient history:  55yo M with acute neurological change in his level of consciousness with leftward head and eye deviation. EEG to evaluate seizure activity. Level of alertness: Awake AEDs during EEG study: VPA Technical aspects: This EEG study was done with scalp electrodes positioned according to the 10-20 International system of electrode placement. Electrical activity was acquired at a sampling rate of 500Hz  and reviewed with a high frequency filter of 70Hz  and a low frequency filter of 1Hz . EEG data were recorded continuously and digitally stored. Description: No posterior dominant rhythm was seen. EEG showed continuous generalized and lateralized right hemisphere 3 to 6 Hz theta-delta slowing admixed with generalized 15-18Hz  beta activity. Hyperventilation and photic stimulation were not performed.   ABNORMALITY - Continuous slow, generalized  and lateralized right hemisphere IMPRESSION: This study is  suggestive of suggestive of cortical dysfunction arising from right hemisphere likely secondary to underlying bleed as well as moderate diffuse encephalopathy, nonspecific etiology. No seizures or definite epileptiform discharges were seen throughout the recording. Dr Derry Lory was  notified. Charlsie Quest   ECHOCARDIOGRAM COMPLETE  Result Date: 06/10/2020    ECHOCARDIOGRAM REPORT   Patient Name:   Rory Xiang [name] Date of Exam: 06/10/2020 Medical Rec #:  [id number]      Height:       70.0 in Accession #:    [id number]     Weight:       251.1 lb Date of Birth:  02-Sep-1965      BSA:          2.299 m Patient Age:    54 years       BP:           140/71 mmHg Patient Gender: M              HR:           102 bpm. Exam Location:  Inpatient Procedure: 2D Echo, Color Doppler and Cardiac Doppler Indications:    CVA  History:        Patient has prior history of Echocardiogram examinations, most                 recent 08/12/2014. Cardiomyopathy and CHF,                 Signs/Symptoms:Shortness of Breath; Risk Factors:Hypertension,                 Dyslipidemia and Diabetes.  Sonographer:    Neomia Dear RDCS Referring Phys: [id number] ASHISH ARORA  Sonographer Comments: Patient is morbidly obese and Technically difficult study due to poor echo windows. IMPRESSIONS  1. Left ventricular ejection fraction, by estimation, is 65 to 70%. The left ventricle has normal function. The left ventricle has no regional wall motion abnormalities. There is severe left ventricular hypertrophy. Indeterminate diastolic filling due to E-A fusion.  2. Right ventricular systolic function is normal. The right ventricular size is normal. There is normal pulmonary artery systolic pressure. The estimated right ventricular systolic pressure is 28.4 mmHg.  3. The  mitral valve is normal in structure. Trivial mitral valve regurgitation. No evidence of mitral stenosis.  4. The aortic valve is normal in structure. Aortic valve regurgitation is trivial. No aortic stenosis is present.  5. The inferior vena cava is normal in size with greater than 50% respiratory variability, suggesting right atrial pressure of 3 mmHg.  6. Increased flow velocities may be secondary to anemia, thyrotoxicosis, hyperdynamic or high flow state.  Conclusion(s)/Recommendation(s): No intracardiac source of embolism detected on this transthoracic study. A transesophageal echocardiogram is recommended to exclude cardiac source of embolism if clinically indicated. FINDINGS  Left Ventricle: Left ventricular ejection fraction, by estimation, is 65 to 70%. The left ventricle has normal function. The left ventricle has no regional wall motion abnormalities. The left ventricular internal cavity size was normal in size. There is  severe left ventricular hypertrophy. Indeterminate diastolic filling due to E-A fusion. Right Ventricle: The right ventricular size is normal. No increase in right ventricular wall thickness. Right ventricular systolic function is normal. There is normal pulmonary artery systolic pressure. The tricuspid regurgitant velocity is 2.52 m/s, and  with an assumed right atrial pressure of 3 mmHg, the estimated right ventricular systolic pressure is 28.4 mmHg. Left Atrium: Left atrial size was normal in size. Right Atrium: Right atrial size was normal in size. Pericardium: There is no evidence of pericardial effusion. Mitral Valve: The mitral valve is normal in structure. Trivial mitral valve regurgitation. No evidence of mitral valve stenosis. MV peak gradient, 13.0 mmHg. The mean mitral valve gradient is 4.0 mmHg. Tricuspid Valve: The tricuspid valve is normal in structure. Tricuspid valve regurgitation is trivial. No evidence of tricuspid stenosis. Aortic Valve: The aortic valve is normal in structure. Aortic valve regurgitation is trivial. No aortic stenosis is present. Aortic valve mean gradient measures 7.0 mmHg. Aortic valve peak gradient measures 13.8 mmHg. Aortic valve area, by VTI measures 3.77 cm. Pulmonic Valve: The pulmonic valve was not well visualized. Pulmonic valve regurgitation is trivial. No evidence of pulmonic stenosis. Aorta: The aortic root is normal in size and structure. Venous: The inferior vena cava is normal in size with  greater than 50% respiratory variability, suggesting right atrial pressure of 3 mmHg. IAS/Shunts: No atrial level shunt detected by color flow Doppler.  LEFT VENTRICLE PLAX 2D LVIDd:         4.00 cm     Diastology LVIDs:         2.00 cm     LV e' medial:    4.57 cm/s LV PW:         1.70 cm     LV E/e' medial:  16.3 LV IVS:        1.60 cm     LV e' lateral:   7.83 cm/s LVOT diam:     2.30 cm     LV E/e' lateral: 9.5 LV SV:         106 LV SV Index:   46 LVOT Area:     4.15 cm  LV Volumes (MOD) LV vol d, MOD A2C: 78.9 ml LV vol d, MOD A4C: 93.0 ml LV vol s, MOD A2C: 23.9 ml LV vol s, MOD A4C: 22.9 ml LV SV MOD A2C:     55.0 ml LV SV MOD A4C:     93.0 ml LV SV MOD BP:      63.6 ml RIGHT VENTRICLE RV Basal diam:  3.70 cm RV Mid diam:    2.90 cm RV S prime:  25.20 cm/s TAPSE (M-mode): 3.7 cm LEFT ATRIUM             Index       RIGHT ATRIUM           Index LA diam:        3.20 cm 1.39 cm/m  RA Area:     13.30 cm LA Vol (A2C):   65.6 ml 28.53 ml/m RA Volume:   31.10 ml  13.53 ml/m LA Vol (A4C):   59.8 ml 26.01 ml/m LA Biplane Vol: 63.6 ml 27.66 ml/m  AORTIC VALVE                    PULMONIC VALVE AV Area (Vmax):    3.19 cm     PV Vmax:       1.64 m/s AV Area (Vmean):   3.23 cm     PV Vmean:      113.000 cm/s AV Area (VTI):     3.77 cm     PV VTI:        0.256 m AV Vmax:           186.00 cm/s  PV Peak grad:  10.8 mmHg AV Vmean:          124.000 cm/s PV Mean grad:  6.0 mmHg AV VTI:            0.282 m AV Peak Grad:      13.8 mmHg AV Mean Grad:      7.0 mmHg LVOT Vmax:         143.00 cm/s LVOT Vmean:        96.300 cm/s LVOT VTI:          0.256 m LVOT/AV VTI ratio: 0.91  AORTA Ao Root diam: 3.20 cm Ao Asc diam:  3.20 cm Ao Arch diam: 3.2 cm MITRAL VALVE                TRICUSPID VALVE MV Area (PHT): 4.63 cm     TR Peak grad:   25.4 mmHg MV Area VTI:   3.39 cm     TR Vmax:        252.00 cm/s MV Peak grad:  13.0 mmHg MV Mean grad:  4.0 mmHg     SHUNTS MV Vmax:       1.80 m/s     Systemic VTI:  0.26 m MV Vmean:       94.4 cm/s    Systemic Diam: 2.30 cm MV Decel Time: 164 msec MV E velocity: 74.30 cm/s MV A velocity: 106.00 cm/s MV E/A ratio:  0.70 Weston Brass MD Electronically signed by Weston Brass MD Signature Date/Time: 06/10/2020/11:44:08 AM    Final    CT HEAD CODE STROKE WO CONTRAST  Result Date: 06/09/2020 CLINICAL DATA:  Code stroke.  Rightward gaze.  Left arm weakness. EXAM: CT HEAD WITHOUT CONTRAST TECHNIQUE: Contiguous axial images were obtained from the base of the skull through the vertex without intravenous contrast. COMPARISON:  None. FINDINGS: Brain: An acute hemorrhage centered in the right thalamus measures 3.6 x 2.8 x 2.7 cm (approximate volume of 14 mL). There is intraventricular extension with a small amount of hemorrhage in the right greater than left lateral ventricles, third ventricle, and fourth ventricle. The ventricles are not grossly dilated, however prior imaging is not available to no the patient's baseline ventricle size. There is mild edema surrounding the right thalamic hemorrhage. There is 4 mm of leftward midline shift at the  level of the thalami. Hypodensities in the cerebral white matter bilaterally are nonspecific but likely reflect mild chronic small vessel ischemic disease given the patient's vascular risk factors. No acute cortically based infarct or extra-axial fluid collection is evident. Vascular: No hyperdense vessel. Skull: No fracture or suspicious osseous lesion. Sinuses/Orbits: Minimal mucosal thickening in the right sphenoid sinus. Clear mastoid air cells. Left cataract extraction. Rightward gaze. Other: None. ASPECTS [facility] Stroke Program Early CT Score) Not scored in the presence of acute hemorrhage. IMPRESSION: 1. 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 2. 4 mm of leftward midline shift. 3. Mild chronic small vessel ischemic disease. These results were communicated to Dr. Wilford Corner at 4:17 pm on 06/09/2020 by text page via the [facility] messaging  system. Electronically Signed   By: Sebastian Ache M.D.   On: 06/09/2020 16:21   VAS US CAROTID  Result Date: 06/12/2020 Carotid Arterial Duplex Study Patient Name:  Tarrence Enck [name]  Date of Exam:   06/10/2020 Medical Rec #: [id number]       Accession #:    [id number] Date of Birth: 23-Jul-1965       Patient Gender: M Patient Age:   54Y Exam Location:  [facility] Procedure:      VAS US CAROTID Referring Phys: [id number] Marvel Plan --------------------------------------------------------------------------------  Indications:       CVA. Risk Factors:      Hypertension, hyperlipidemia. Limitations        Today's exam was limited due to the body habitus of the                    patient, the patient's respiratory variation, patient                    movement, patient somnolence and a central line. Comparison Study:  No prior studies. Performing Technologist: Chanda Busing RVT  Examination Guidelines: A complete evaluation includes B-mode imaging, spectral Doppler, color Doppler, and power Doppler as needed of all accessible portions of each vessel. Bilateral testing is considered an integral part of a complete examination. Limited examinations for reoccurring indications may be performed as noted.  Right Carotid Findings: +----------+--------+--------+--------+-----------------------+--------+           PSV cm/sEDV cm/sStenosisPlaque Description     Comments +----------+--------+--------+--------+-----------------------+--------+ CCA Prox  101     12                                              +----------+--------+--------+--------+-----------------------+--------+ CCA Distal82      11              smooth and heterogenous         +----------+--------+--------+--------+-----------------------+--------+ ICA Prox  58      18              smooth and heterogenous         +----------+--------+--------+--------+-----------------------+--------+ ICA Distal57      17                                      tortuous +----------+--------+--------+--------+-----------------------+--------+ ECA       136     19                                              +----------+--------+--------+--------+-----------------------+--------+ +----------+--------+-------+--------+-------------------+  PSV cm/sEDV cmsDescribeArm Pressure (mmHG) +----------+--------+-------+--------+-------------------+ Subclavian120                                        +----------+--------+-------+--------+-------------------+ +---------+--------+--+--------+--+---------+ VertebralPSV cm/s56EDV cm/s13Antegrade +---------+--------+--+--------+--+---------+  Left Carotid Findings: +----------+--------+--------+--------+-----------------------+--------+           PSV cm/sEDV cm/sStenosisPlaque Description     Comments +----------+--------+--------+--------+-----------------------+--------+ CCA Prox  58      8               smooth and heterogenous         +----------+--------+--------+--------+-----------------------+--------+ CCA Distal69      11              smooth and heterogenous         +----------+--------+--------+--------+-----------------------+--------+ ICA Prox  69      20              smooth and heterogenoustortuous +----------+--------+--------+--------+-----------------------+--------+ ICA Distal112     34                                     tortuous +----------+--------+--------+--------+-----------------------+--------+ ECA       99      14                                              +----------+--------+--------+--------+-----------------------+--------+ +----------+--------+--------+--------+-------------------+           PSV cm/sEDV cm/sDescribeArm Pressure (mmHG) +----------+--------+--------+--------+-------------------+ [id number]                                         +----------+--------+--------+--------+-------------------+  +---------+--------+--+--------+--+---------+ VertebralPSV cm/s41EDV cm/s10Antegrade +---------+--------+--+--------+--+---------+   Summary: Right Carotid: Velocities in the right ICA are consistent with a 1-39% stenosis. Left Carotid: Velocities in the left ICA are consistent with a 1-39% stenosis. Vertebrals: Bilateral vertebral arteries demonstrate antegrade flow. *See table(s) above for measurements and observations.  Electronically signed by Delia Heady MD on 06/12/2020 at 2:03:49 PM.    Final    [place] EKG SITE RITE  Result Date: 06/09/2020 If Site Rite image not attached, placement could not be confirmed due to current cardiac rhythm.   PHYSICAL EXAM  Temp:  [97.9 F (36.6 C)-99 F (37.2 C)] 97.9 F (36.6 C) (05/29 0700) Pulse Rate:  [68-99] 89 (05/29 0916) Resp:  [13-21] 17 (05/29 0700) BP: (108-206)/(54-137) 167/91 (05/29 0916) SpO2:  [95 %-96 %] 96 % (05/29 0630) Weight:  [106.1 kg] 106.1 kg (05/29 0500)  General -obese middle-aged African-American male, drowsy but easily arousable  Ophthalmologic - fundi not visualized due to noncooperation.  Cardiovascular - Regular rhythm and rate.  Neuro - drowsy but easily arousable with voice stimulation, with eyes open he orientated to age, place and people, but not to time. No aphasia, paucity of speech, able to name, following all simple commands. No gaze palsy but seems b/l lateral gaze incomplete, tracking bilaterally, visual field full, PERRL. Left facial droop. Tongue midline. RUE at least 4/5 and RLE at least 3/5, LUE 1/5 and LLE 2/5. Sensation symmetrical bilaterally, right FTN intact, gait not tested.       ASSESSMENT/PLAN [name]  is a 55 y.o. male with history of hypertension, obesity, cardiomyopathy, hyperlipidemia, CHF admitted for right-sided gaze, right-sided weakness numbness, fall. No tPA given due to ICH.    ICH:  right thalamic ICH with IVH, likely due to uncontrolled hypertension New altered mental  status with depressed consciousness and left gaze and head deviation possibly seizures  CT head showed right thalamic ICH with IVH with 4 mm midline shift  Repeat CT head no hematoma expansion  MRI stable right thalamic ICH with IVH and midline shift  MRA no aneurysm or AVM  CT repeat x 2 stable right thalamic hemorrhage with mild intraventricular extension.  2D Echo EF 65-70%  CUS unremarkable  LDL 78  HgbA1c 5.5  UDS negative  SCDs for VTE prophylaxis  No antithrombotic prior to admission, now on No antithrombotic due to ICH  Ongoing aggressive stroke risk factor management  Therapy recommendations: CIR  Disposition: Pending  Seizure   Likely partial complex seizure 5/27  Spot EEG no seizure recorded  On keppra now  Received depakote 5/26 evening for HA, depakote level 45 - no depakote for seizure given side effect of weight gain.  LTM EEG no seizure, d/c 5/29  Seizure precautions  Hypertensive emergency . Home medication metoprolol 100 twice daily, HCTZ, losartan . Stable on the high end . BP goal < 160 . Off Cleviprex  . amlodipine 10, labetalol 100 Bid, losartan 50 bid . Clonidine 0.2 tid ->low BP episode -> 0.1 bid  Long term BP goal normotensive  Hyperlipidemia  Home meds: Lipitor 40  LDL 78, goal < 70  Now on no statin given recent ICH  Resume statin at discharge  AKI  Cre 0.9->1.68->1.19->1.92->1.41->1.38  Encourage po intake   BMP monitoring  Dysphagia  Speech on board  dys 3 and thin liquid  Aspiration precaution  Alcohol abuse  About 24 drinks per week  On CIWA protocol  FA/MVI/B1  Sundowning   Delirium and confusion at evening  Put on seroquel 25mg  Qhs  Other Stroke Risk Factors  Obesity, Body mass index is 33.56 kg/m.   Likely obstructive sleep apnea, undiagnosed  Other Active Problems    This patient is critically ill due to seizure, right ICH with IVH, hypertensive emergency, AKI, alcohol abuse  and at significant risk of neurological worsening, death form hematoma expansion, hydrocephalus, renal failure, DT, sepsis. This patient's care requires constant monitoring of vital signs, hemodynamics, respiratory and cardiac monitoring, review of multiple databases, neurological assessment, discussion with family, other specialists and medical decision making of high complexity. I spent 40 minutes of neurocritical care time in the care of this patient. I had long discussion with wife at bedside, updated pt current condition, treatment plan and potential prognosis, and answered all the questions. She expressed understanding and appreciation.    , MD PhD Stroke Neurology 06/20/2020 12:57 PM    To contact Stroke Continuity provider, please refer to 06/22/2020. After hours, contact General Neurology

## 2020-06-21 DIAGNOSIS — G40909 Epilepsy, unspecified, not intractable, without status epilepticus: Secondary | ICD-10-CM | POA: Diagnosis not present

## 2020-06-21 DIAGNOSIS — E875 Hyperkalemia: Secondary | ICD-10-CM | POA: Diagnosis not present

## 2020-06-21 DIAGNOSIS — E871 Hypo-osmolality and hyponatremia: Secondary | ICD-10-CM

## 2020-06-21 DIAGNOSIS — I615 Nontraumatic intracerebral hemorrhage, intraventricular: Secondary | ICD-10-CM | POA: Diagnosis not present

## 2020-06-21 DIAGNOSIS — I610 Nontraumatic intracerebral hemorrhage in hemisphere, subcortical: Secondary | ICD-10-CM | POA: Diagnosis not present

## 2020-06-21 DIAGNOSIS — R569 Unspecified convulsions: Secondary | ICD-10-CM | POA: Diagnosis not present

## 2020-06-21 DIAGNOSIS — I10 Essential (primary) hypertension: Secondary | ICD-10-CM | POA: Diagnosis not present

## 2020-06-21 LAB — CBC
HCT: 39.2 % (ref 39.0–52.0)
Hemoglobin: 13.2 g/dL (ref 13.0–17.0)
MCH: 31.1 pg (ref 26.0–34.0)
MCHC: 33.7 g/dL (ref 30.0–36.0)
MCV: 92.5 fL (ref 80.0–100.0)
Platelets: 446 10*3/uL — ABNORMAL HIGH (ref 150–400)
RBC: 4.24 MIL/uL (ref 4.22–5.81)
RDW: 11.8 % (ref 11.5–15.5)
WBC: 7.3 10*3/uL (ref 4.0–10.5)
nRBC: 0 % (ref 0.0–0.2)

## 2020-06-21 LAB — BASIC METABOLIC PANEL
Anion gap: 9 (ref 5–15)
BUN: 23 mg/dL — ABNORMAL HIGH (ref 6–20)
CO2: 24 mmol/L (ref 22–32)
Calcium: 9.5 mg/dL (ref 8.9–10.3)
Chloride: 101 mmol/L (ref 98–111)
Creatinine, Ser: 1.19 mg/dL (ref 0.61–1.24)
GFR, Estimated: >60 mL/min (ref >60)
Glucose, Bld: 107 mg/dL — ABNORMAL HIGH (ref 70–99)
Potassium: 5.7 mmol/L — ABNORMAL HIGH (ref 3.5–5.1)
Sodium: 134 mmol/L — ABNORMAL LOW (ref 135–145)

## 2020-06-21 MED ORDER — SODIUM POLYSTYRENE SULFONATE 15 GM/60ML PO SUSP
30.0000 g | Freq: Once | ORAL | Status: AC
  Administered 2020-06-21: 30 g via ORAL
  Filled 2020-06-21: qty 120

## 2020-06-21 MED ORDER — LEVETIRACETAM 500 MG PO TABS
500.0000 mg | ORAL_TABLET | Freq: Two times a day (BID) | ORAL | Status: DC
  Administered 2020-06-21 – 2020-06-22 (×2): 500 mg via ORAL
  Filled 2020-06-21 (×2): qty 1

## 2020-06-21 MED ORDER — CLONIDINE HCL 0.1 MG PO TABS
0.1000 mg | ORAL_TABLET | Freq: Three times a day (TID) | ORAL | Status: DC
  Administered 2020-06-21 – 2020-06-22 (×3): 0.1 mg via ORAL
  Filled 2020-06-21 (×3): qty 1

## 2020-06-21 NOTE — Progress Notes (Addendum)
Physical Therapy Treatment Patient Details Name: [name] MRN: [id number] DOB: 12-07-1965 Today's Date: 06/21/2020    History of Present Illness Pt is a 55 y.o. male who presented 5/18 with L sided weakness, numbness, and right gaze preference. No tPA administered due to ICH. 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 5/27 pt with AMs with encephalopathy with repeat EEG and CT and ICU transfer. PMH: HTN, obesity, cardiomyopathy, hyperlipidemia, and CHF.    PT Comments    Pt pleasant and willing to mobilize and get OOB. Pt fatigued after sitting EOB 4 min and able to stand total of 3 trials from EOb and stedy. Pt educated for need to attend to LUE, positioning, transfers, HEP and progression. Pt limited by fatigue but progressing well with standing and continues to need assist with balance. Will continue to follow.     Follow Up Recommendations  CIR;Supervision/Assistance - 24 hour     Equipment Recommendations  Rolling walker with 5" wheels;3in1 (PT);Wheelchair cushion (measurements PT);Wheelchair (measurements PT)    Recommendations for Other Services       Precautions / Restrictions Precautions Precautions: Fall;Other (comment) Precaution Comments: Lt hemi with right gaze preference Restrictions Weight Bearing Restrictions: No    Mobility  Bed Mobility Overal bed mobility: Needs Assistance Bed Mobility: Supine to Sit     Supine to sit: Mod assist;+2 for physical assistance     General bed mobility comments: physical assist with cues to hook RLE under LLE to pivot legs to EOB and mod +2 assist to lift trunk from surface toward right side of bed with mod cues to push into bed with right elbow    Transfers Overall transfer level: Needs assistance   Transfers: Sit to/from Stand Sit to Stand: Min assist;+2 physical assistance         General transfer comment: with left knee blocked and bil UE hooked  with PT/tech pt able to stand EOB and achieve fully upright with cues and assist for weight shifting to midine grossly 30 sec. Pt then stood with [facility] for pivot to chair  Ambulation/Gait             General Gait Details: not yet able   Stairs             Wheelchair Mobility    Modified Rankin (Stroke Patients Only) Modified Rankin (Stroke Patients Only) Pre-Morbid Rankin Score: No symptoms Modified Rankin: Severe disability     Balance Overall balance assessment: Needs assistance Sitting-balance support: Feet supported;Single extremity supported Sitting balance-Leahy Scale: Poor Sitting balance - Comments: pt with tendency for anterior left lean with cues for midline and upright posture. Pt able to correct at times but can only maintain for a couple seconds   Standing balance support: Bilateral upper extremity supported Standing balance-Leahy Scale: Poor Standing balance comment: left lateral bias                            Cognition Arousal/Alertness: Awake/alert Behavior During Therapy: WFL for tasks assessed/performed Overall Cognitive Status: Impaired/Different from baseline Area of Impairment: Attention;Following commands;Safety/judgement;Awareness;Problem solving                   Current Attention Level: Sustained Memory: Decreased short-term memory Following Commands: Follows one step commands with increased time;Follows multi-step commands inconsistently Safety/Judgement: Decreased awareness of safety;Decreased awareness of deficits   Problem Solving: Slow processing;Requires tactile cues;Requires  verbal cues General Comments: pt awake with STM deficits needing redirection and repetition of tasks. pt with poor awareness of balance and strength deficits      Exercises General Exercises - Lower Extremity Long Arc Quad: AAROM;Left;Seated;15 reps Hip Flexion/Marching: AAROM;Left;Seated;10 reps    General Comments         Pertinent Vitals/Pain Pain Assessment: No/denies pain    Home Living                      Prior Function            PT Goals (current goals can now be found in the care plan section) Acute Rehab PT Goals Time For Goal Achievement: 07/05/20 Potential to Achieve Goals: Good Progress towards PT goals: Progressing toward goals    Frequency    Min 4X/week      PT Plan Current plan remains appropriate    Co-evaluation              AM-PAC PT "6 Clicks" Mobility   Outcome Measure  Help needed turning from your back to your side while in a flat bed without using bedrails?: A Lot Help needed moving from lying on your back to sitting on the side of a flat bed without using bedrails?: Total Help needed moving to and from a bed to a chair (including a wheelchair)?: Total Help needed standing up from a chair using your arms (e.g., wheelchair or bedside chair)?: Total Help needed to walk in hospital room?: Total Help needed climbing 3-5 steps with a railing? : Total 6 Click Score: 7    End of Session Equipment Utilized During Treatment: Gait belt Activity Tolerance: Patient tolerated treatment well Patient left: with call bell/phone within reach;in chair;with chair alarm set Nurse Communication: Mobility status PT Visit Diagnosis: Unsteadiness on feet (R26.81);Muscle weakness (generalized) (M62.81);Difficulty in walking, not elsewhere classified (R26.2);Other symptoms and signs involving the nervous system (R29.898);Hemiplegia and hemiparesis Hemiplegia - dominant/non-dominant: Non-dominant Hemiplegia - caused by: Nontraumatic intracerebral hemorrhage     Time: 2542-7062 PT Time Calculation (min) (ACUTE ONLY): 22 min  Charges:  $Therapeutic Activity: 8-22 mins                     Paw Karstens P, PT Acute Rehabilitation Services Pager: [phone] Office: [phone]    Venida Tsukamoto B Franciscojavier Wronski 06/21/2020, 11:51 AM

## 2020-06-21 NOTE — Progress Notes (Signed)
  Speech Language Pathology Treatment: Dysphagia  Patient Details Name: [name] MRN: [id number] DOB: [birth year] Today's Date: 06/21/2020 Time: 0712-1975 SLP Time Calculation (min) (ACUTE ONLY): 15 min  Assessment / Plan / Recommendation Clinical Impression  Pt had just finished his lunch meal upon SLP arrival, and his wife reported just a single episode of difficulty when he appeared to get too much food at once. He was still agreeable to have a snack with SLP, so additional soft solids and thin liquids were consumed. He is very alert today and verbalizes the swallowing strategies he needs to use to manage L buccal pocketing. In implementation, he still needs Min cues. Recommend continuing with Dys 3 (mech soft) solids and thin liquids - pt/wife are in agreement. Recommend CIR level follow up.    HPI HPI: [name] is a 55 y.o. male with a PMHx of obesity, HTN, ETOH abuse ( pk/day x 10 yrs), HLD, Cs/dHF who presents after being found down CT revealed acute right thalamic hemorrhage with extension to the ventricles. 26mm shift.      SLP Plan  Continue with current plan of care       Recommendations  Diet recommendations: Dysphagia 3 (mechanical soft);Thin liquid Liquids provided via: Cup;Straw Medication Administration: Whole meds with puree Supervision: Patient able to self feed;Staff to assist with self feeding Compensations: Slow rate;Small sips/bites;Lingual sweep for clearance of pocketing Postural Changes and/or Swallow Maneuvers: Seated upright 90 degrees;Upright 30-60 min after meal                Oral Care Recommendations: Oral care BID Follow up Recommendations: Inpatient Rehab SLP Visit Diagnosis: Dysphagia, unspecified (R13.10);Cognitive communication deficit (R41.841) Plan: Continue with current plan of care       GO                Mahala Menghini., M.A. CCC-SLP Acute Rehabilitation Services Pager [phone] Office [phone]  06/21/2020, 1:05  PM

## 2020-06-21 NOTE — Progress Notes (Signed)
Inpatient Rehabilitation Admissions Coordinator  I met with patient and his wife at bedside. Patient very alert and talkative. I await CIR bed availability to admit. Hopeful in next 1 to 2 days.  Danne Baxter, RN, MSN Rehab Admissions Coordinator [phone] 06/21/2020 11:39 AM

## 2020-06-21 NOTE — H&P (Signed)
Physical Medicine and Rehabilitation Admission H&P    Chief Complaint  Patient presents with  .  Stroke with functional deficits.    HPI: [name] is a 55 year old RH male with history of obesity, HTN, and ICM, chronic combined systolic/diastolic CHF who was admitted on 06/08/2020 after wife found patient in bathroom with right gaze preference, right-sided weakness and right-sided numbness.  Blood pressure is elevated at 200-230s and patient had complaints of headache.  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  He was started on Cleviprex and had increasing lethargy with snoring requiring nonrebreather mask in ED.  He was started on hypertonic saline and follow-up MR MRA was negative for vascular malformation, occlusion or significant stenosis. MRI brain showed no substantial change in size of hemorrhage and stable 4 mm leftward shift.  Carotid Dopplers were negative for significant ICA stenosis.  2D echo done showing EF 65 to 70% with no significant wall abnormality and severe LVH.  Serial CT head done for monitoring and showed partial clearing of intraventricular hemorrhage with mild progressive ventricular enlargement without frank hydrocephalus.  He was weaned off hypertonic saline as well as Cleviprex and blood pressure control has improved with addition of multiple medications.  He has had issues with bouts of agitation and was treated with CIWA protocol.  Seroquel added to help manage delirium.  Swallow evaluation completed and patient placed on dysphagia 2 with nectar liquids initially.  He continued to have issues with significant headache and valproic acid added.  On 05/27, patient was found in bed lethargic with minimal responsiveness, had neck jerking movements to the left with left gaze deviation jerking to the left with left gaze deviation.  Repeat CT of head was stable.  LT-EEG done showing slowing generalized lateralization to right  hemisphere without definite epileptiform discharges.  Low-dose Keppra was added for likely partial complex seizure and he was admitted to ICU briefly on 05/27 for closer monitoring due to decreasing in neurological status.  He has had issues with hyponatremia as well as hyperkalemia requiring a dose of Kayexalate.  Acute renal failure is resolving.  As mentation back to baseline, he was transferred back to regular floor.  Diet has been advanced to dysphagia 3 thin liquids.  He continues to be limited by fatigue, dense left hemiplegia with right gaze preference with difficulty tracking to left lateral field.  Therapy has been ongoing and patient working on attempts at standing with left knee blocked.  CIR was recommended due to functional decline.  Was admitted  Review of Systems  Constitutional: Negative for chills and fever.  HENT: Negative for hearing loss and tinnitus.   Eyes: Positive for blurred vision.  Respiratory: Positive for shortness of breath (wtih all activity).   Cardiovascular: Positive for chest pain (with certain activity/stress exertion.).  Gastrointestinal: Negative for constipation, heartburn and nausea.  Genitourinary: Negative for dysuria and urgency.       Gets up every hour at night  Musculoskeletal: Negative for myalgias and neck pain.  Skin: Negative for rash.  Neurological: Positive for sensory change, speech change, weakness and headaches (constant).  Psychiatric/Behavioral: The patient has insomnia.      Past Medical History:  Diagnosis Date  . Chronic combined systolic and diastolic CHF (congestive heart failure) (HCC)    EF 25-30%  . Diastolic dysfunction   . Dyslipidemia   . Hyperlipidemia   . Hypertension   . Nonischemic dilated cardiomyopathy (HCC)  felt secondary to HTN with no ischemia on nuclear stress test 05/2012, EF 25-30%  . Shortness of breath     Past Surgical History:  Procedure Laterality Date  . KNEE ARTHROSCOPY Left 04/13/2016    Procedure: ARTHROSCOPY KNEE WITH DEBRIDEMENT;  Surgeon: Valeria Batman, MD;  Location: [facility];  Service: Orthopedics;  Laterality: Left;  . KNEE ARTHROSCOPY WITH MEDIAL MENISECTOMY Right 04/13/2016   Procedure: KNEE ARTHROSCOPY WITH MEDIAL MENISECTOMY;  Surgeon: Valeria Batman, MD;  Location: [facility];  Service: Orthopedics;  Laterality: Right;  . NASAL FRACTURE SURGERY      Family History  Problem Relation Age of Onset  . Hypertension Mother        Passed away when she is 58 secondary to hip surgery complications  . CAD Mother   . Heart attack Mother   . Hypertension Father   . Heart attack Maternal Grandmother     Social History:  Married. 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 He reports that he has never smoked. He has never used smokeless tobacco. He reports current alcohol use of about has 9 cans of beer day and 7 shots of gin/vodka daily. He reports that he does not use drugs.    Allergies: No Known Allergies    Medications Prior to Admission  Medication Sig Dispense Refill  . atorvastatin (LIPITOR) 40 MG tablet Take 1 tablet (40 mg total) by mouth daily at 6 PM. 90 tablet 1  . hydrochlorothiazide (HYDRODIURIL) 12.5 MG tablet Take 1 tablet (12.5 mg total) by mouth every other day. 90 tablet 1  . ibuprofen (ADVIL) 200 MG tablet Take 800 mg by mouth every 6 (six) hours as needed for moderate pain.    . metoprolol tartrate (LOPRESSOR) 100 MG tablet Take 1 tablet (100 mg total) by mouth 2 (two) times daily. 180 tablet 1  . Multiple Vitamin (MULTIVITAMIN WITH MINERALS) TABS tablet Take 1 tablet by mouth daily.    Marland Kitchen losartan (COZAAR) 50 MG tablet Take 1 tablet (50 mg total) by mouth daily. 90 tablet 1  . predniSONE (DELTASONE) 10 MG tablet 6 tabs po day 1, 5 tabs po day 2, 4 tabs po day 3, 3 tabs po day 4, 2 tabs po day 5, 1 tab po day 6 (Patient not taking: No sig reported) 21 tablet 0    Drug Regimen Review  Drug regimen was reviewed and  remains appropriate with no significant issues identified  Home: Home Living Family/patient expects to be discharged to:: Private residence Living Arrangements: Spouse/significant other Available Help at Discharge: Family,Available 24 hours/day Type of Home: House Home Access: Stairs to enter [employer] of Steps: 1 Entrance Stairs-Rails: Left Home Layout: One level Bathroom Shower/Tub: Engineer, manufacturing systems: Standard Bathroom Accessibility: Yes Home Equipment: None  Lives With: Spouse   Functional History: Prior Function Level of Independence: Independent Comments: Pt drives a forklift.  Functional Status:  Mobility: Bed Mobility Overal bed mobility: Needs Assistance Bed Mobility: Supine to Sit Rolling: Min assist,Min guard Sidelying to sit: Mod assist Supine to sit: Mod assist,+2 for physical assistance Sit to supine: Total assist,+2 for physical assistance,+2 for safety/equipment General bed mobility comments: physical assist with cues to hook RLE under LLE to pivot legs to EOB and mod +2 assist to lift trunk from surface toward right side of bed with mod cues to push into bed with right elbow Transfers Overall transfer level: Needs assistance Equipment used: Ambulation equipment used Transfer via Lift  Equipment: Stedy Transfers: Sit to/from [employer] to Stand: [employer] physical assistance Squat pivot transfers: +2 physical assistance,Total assist General transfer comment: with left knee blocked and bil UE hooked with PT/tech pt able to stand EOB and achieve fully upright with cues and assist for weight shifting to midine grossly 30 sec. Pt then stood with [facility] for pivot to chair Ambulation/Gait General Gait Details: not yet able    ADL: ADL Overall ADL's : Needs assistance/impaired Eating/Feeding: Set up,Sitting (will further assess; unsure if pt attending to full tray) Grooming: Minimal assistance,Sitting,Maximal assistance Grooming  Details (indicate cue type and reason): Pt requiring assistance to maintain grasp on tooth paste with L hand. Also poor sitting balance when performing oral care and needing Max A for support at EOB Upper Body Bathing: Moderate assistance,Bed level Lower Body Bathing: Maximal assistance,Bed level Upper Body Dressing : Moderate assistance,Bed level Lower Body Dressing: Total assistance Toilet Transfer: Minimal assistance,+2 for physical assistance,Total assistance (sit<>stand wit stedy and then transfer to recliner) Toilet Transfer Details (indicate cue type and reason): Min A +2 for sit<>stand with stedy adn then transfer to recliner Toileting- Clothing Manipulation and Hygiene: Total assistance Functional mobility during ADLs: +2 for physical assistance,Minimal assistance (sit <> stand) General ADL Comments: Highly motivated. Decreased strength, balance, attention to L, and cognition  Cognition: Cognition Overall Cognitive Status: Impaired/Different from baseline Arousal/Alertness: Awake/alert Orientation Level: Oriented X4 Attention: Sustained Sustained Attention: Impaired Sustained Attention Impairment: Verbal basic Memory: Impaired Memory Impairment: Retrieval deficit Awareness: Impaired Awareness Impairment: Emergent impairment,Anticipatory impairment Problem Solving: Impaired Problem Solving Impairment: Verbal basic Executive Function: Sequencing,Organizing Behaviors: Impulsive Safety/Judgment: Impaired Cognition Arousal/Alertness: Awake/alert Behavior During Therapy: WFL for tasks assessed/performed Overall Cognitive Status: Impaired/Different from baseline Area of Impairment: Attention,Following commands,Safety/judgement,Awareness,Problem solving Current Attention Level: Sustained Memory: Decreased short-term memory Following Commands: Follows one step commands with increased time,Follows multi-step commands inconsistently Safety/Judgement: Decreased awareness of  safety,Decreased awareness of deficits Awareness: Intellectual,Emergent Problem Solving: Slow processing,Requires tactile cues,Requires verbal cues General Comments: pt awake with STM deficits needing redirection and repetition of tasks. pt with poor awareness of balance and strength deficits  Blood pressure (!) 151/87, pulse 79, temperature 98.7 F (37.1 C), temperature source Oral, resp. rate 18, height 5\' 10"  (1.778 m), weight 106.3 kg, SpO2 98 %. Physical Exam Vitals and nursing note reviewed.  Constitutional:      General: He is not in acute distress.    Appearance: He is obese. He is not ill-appearing.     Comments: Tends to keep eyes closed and falls asleep easily.   HENT:     Head: Normocephalic and atraumatic.     Right Ear: External ear normal.     Left Ear: External ear normal.     Nose: Nose normal.     Mouth/Throat:     Mouth: Mucous membranes are moist.  Eyes:     Extraocular Movements: Extraocular movements intact.     Pupils: Pupils are equal, round, and reactive to light.  Cardiovascular:     Rate and Rhythm: Normal rate and regular rhythm.     Heart sounds: No murmur heard. No gallop.   Pulmonary:     Effort: Pulmonary effort is normal. No respiratory distress.     Breath sounds: No wheezing.  Abdominal:     General: Bowel sounds are normal. There is no distension.     Tenderness: There is no abdominal tenderness.  Musculoskeletal:        General: No tenderness.     Cervical  back: Normal range of motion and neck supple.     Comments: Mild edema LUE. Trace LLE also  Skin:    General: Skin is warm.  Neurological:     Mental Status: He is oriented to person, place, and time.     Comments: Pt is alert and oriented x 3. Left central 7 with dysarthria. Normal language. Inattentive to left, left eye lags during visual tracking (chronic). Visual fields appear to be grossly intact. LUE 0/5, LLE 1/5 HE but otherwise 0 to tr/5. RUE and RLE 5/5. Mild sensory changes LUE  and LLE to LT and pain. DTR's 1+ throughout.   Psychiatric:        Mood and Affect: Mood normal.        Behavior: Behavior normal.        Thought Content: Thought content normal.        Judgment: Judgment normal.     Results for orders placed or performed during the hospital encounter of 06/09/20 (from the past 48 hour(s))  Glucose, capillary     Status: None   Collection Time: 06/19/20  5:09 PM  Result Value Ref Range   Glucose-Capillary 97 70 - 99 mg/dL    Comment: Glucose reference range applies only to samples taken after fasting for at least 8 hours.  CBC     Status: Abnormal   Collection Time: 06/20/20  1:33 AM  Result Value Ref Range   WBC 7.0 4.0 - 10.5 K/uL   RBC 4.03 (L) 4.22 - 5.81 MIL/uL   Hemoglobin 12.4 (L) 13.0 - 17.0 g/dL   HCT 16.137.8 (L) 09.639.0 - 04.552.0 %   MCV 93.8 80.0 - 100.0 fL   MCH 30.8 26.0 - 34.0 pg   MCHC 32.8 30.0 - 36.0 g/dL   RDW 40.911.8 81.111.5 - 91.415.5 %   Platelets 466 (H) 150 - 400 K/uL   nRBC 0.0 0.0 - 0.2 %    Comment: Performed at [facility], [address] [place], [place] [address]  Basic metabolic panel     Status: Abnormal   Collection Time: 06/20/20  1:33 AM  Result Value Ref Range   Sodium 133 (L) 135 - 145 mmol/L   Potassium 4.4 3.5 - 5.1 mmol/L   Chloride 101 98 - 111 mmol/L   CO2 24 22 - 32 mmol/L   Glucose, Bld 99 70 - 99 mg/dL    Comment: Glucose reference range applies only to samples taken after fasting for at least 8 hours.   BUN 32 (H) 6 - 20 mg/dL   Creatinine, Ser 6.211.38 (H) 0.61 - 1.24 mg/dL   Calcium 9.3 8.9 - 30.810.3 mg/dL   GFR, Estimated >65>60 >78>60 mL/min    Comment: (NOTE) Calculated using the CKD-EPI Creatinine Equation (2021)    Anion gap 8 5 - 15    Comment: Performed at [facility], [address] [place], [place] [address]  CBC     Status: Abnormal   Collection Time: 06/21/20 12:50 AM  Result Value Ref Range   WBC 7.3 4.0 - 10.5 K/uL   RBC 4.24 4.22 - 5.81 MIL/uL   Hemoglobin 13.2 13.0 - 17.0 g/dL   HCT  95.239.2 84.139.0 - 32.452.0 %   MCV 92.5 80.0 - 100.0 fL   MCH 31.1 26.0 - 34.0 pg   MCHC 33.7 30.0 - 36.0 g/dL   RDW 40.111.8 02.711.5 - 25.315.5 %   Platelets 446 (H) 150 - 400 K/uL   nRBC  0.0 0.0 - 0.2 %    Comment: Performed at [facility], [address] [place], [place] [address]  Basic metabolic panel     Status: Abnormal   Collection Time: 06/21/20 12:50 AM  Result Value Ref Range   Sodium 134 (L) 135 - 145 mmol/L   Potassium 5.7 (H) 3.5 - 5.1 mmol/L    Comment: DELTA CHECK NOTED SLIGHT HEMOLYSIS    Chloride 101 98 - 111 mmol/L   CO2 24 22 - 32 mmol/L   Glucose, Bld 107 (H) 70 - 99 mg/dL    Comment: Glucose reference range applies only to samples taken after fasting for at least 8 hours.   BUN 23 (H) 6 - 20 mg/dL   Creatinine, Ser 7.51 0.61 - 1.24 mg/dL   Calcium 9.5 8.9 - 02.5 mg/dL   GFR, Estimated >85 >27 mL/min    Comment: (NOTE) Calculated using the CKD-EPI Creatinine Equation (2021)    Anion gap 9 5 - 15    Comment: Performed at [facility], [address], [place], [place] [address]   CT HEAD WO CONTRAST  Result Date: 06/19/2020 CLINICAL DATA:  History of right thalamic hemorrhage EXAM: CT HEAD WITHOUT CONTRAST TECHNIQUE: Contiguous axial images were obtained from the base of the skull through the vertex without intravenous contrast. COMPARISON:  06/18/2020 FINDINGS: Brain: The known right thalamic hemorrhage seen on prior study is grossly unchanged on this exam. There is mild increased surrounding edema, with persistent mass effect upon the third ventricle. Stable mild ventriculomegaly with minimal blood products layering dependently within the occipital horns of the lateral ventricles. No frank hydrocephalus at this time. No acute extra-axial fluid collections. Vascular: No hyperdense vessel or unexpected calcification. Skull: Normal. Negative for fracture or focal lesion. Sinuses/Orbits: No acute finding. Other: None. IMPRESSION: 1. Stable size of the right thalamic  hemorrhage, with minimal increase in surrounding edema. 2. Stable mass effect upon the third ventricle, with stable mild dilation of the lateral ventricles without frank hydrocephalus. 3. Stable decompression of the thalamic hemorrhage into the ventricular system, with minimal blood products layering dependently within the occipital horns as before. Electronically Signed   By: Sharlet Salina M.D.   On: 06/19/2020 21:43       Medical Problem List and Plan: 1.  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 Course complicated by seizure  -patient may  shower  -ELOS/Goals: 21-23 days, mod assist with PT and OT and supervision with SLP 2.  Antithrombotics: -DVT/anticoagulation:  Pharmaceutical: Lovenox  -antiplatelet therapy: N/AA due to bleed 3.  Ongoing headaches/pain Management: Continue Depakote and Keppra. 4. Mood: LCSW to follow for evaluation and support  -antipsychotic agents: N/AA 5. Neuropsych: This patient is intermittently capable of making decisions on his own behalf. 6. Skin/Wound Care: Routine pressure-relief measures.. 7. Fluids/Electrolytes/Nutrition: Monitor intake/output.  Check CMET in AM.   -appetite reasonable at present. 9.  Partial complex seizure: Continue Keppra 500 mg twice daily. 10.  HTN: Monitor blood pressures 3 times daily.   -- Continue amlodipine, labetalol, losartan and clonidine  -SBP goal of <160 given ICH 11.  Dyslipidemia: On statin. 12.  Acute renal failure: SCr improving from 1.16-->1.92-->1.32.   -- Monitor with routine checks as question worsening due to hypoperfusion with improved BP control. 13.  History of alcohol abuse/delirium: Continue Seroquel 25 mg at bedtime to help with sleep/wake cycle  -- Continue folic acid and thiamine supplementation 14.  Acute blood  loss anemia: Will monitor with serial CBC.  H&H relatively stable in 12-13 range over the last few days. 15.  Morbid  obesity/OHS/OSA?: Outpatient sleep study recommended for work-up      Jacquelynn Cree, PA-C 06/21/2020

## 2020-06-21 NOTE — Progress Notes (Signed)
STROKE TEAM PROGRESS NOTE   SUBJECTIVE (INTERVAL HISTORY) RN and wife are at the bedside. Pt sitting up in chair for lunch, he ate well. Per RN he slept well last night, no sundowning with seroquel. BP stable.     OBJECTIVE Temp:  [98.4 F (36.9 C)-98.7 F (37.1 C)] 98.7 F (37.1 C) (05/30 0830) Pulse Rate:  [68-90] 86 (05/30 0800) Cardiac Rhythm: Normal sinus rhythm (05/30 0800) Resp:  [12-26] 18 (05/30 0800) BP: (132-177)/(64-99) 161/99 (05/30 0700) SpO2:  [94 %-97 %] 94 % (05/30 0600) Weight:  [106.3 kg] 106.3 kg (05/30 0500)  Recent Labs  Lab 06/18/20 1615 06/19/20 1709  GLUCAP 148* 97   Recent Labs  Lab 06/18/20 1823 06/19/20 0552 06/20/20 0133 06/21/20 0050  NA 128* 132* 133* 134*  K 5.4* 4.4 4.4 5.7*  CL 95* 98 101 101  CO2 23 24 24 24   GLUCOSE 117* 84 99 107*  BUN 38* 38* 32* 23*  CREATININE 1.92* 1.41* 1.38* 1.19  CALCIUM 9.5 9.6 9.3 9.5  MG 2.6*  --   --   --   PHOS 5.6*  --   --   --    Recent Labs  Lab 06/18/20 1823  AST 25  ALT 31  ALKPHOS 54  BILITOT 1.1  PROT 7.5  ALBUMIN 3.7   Recent Labs  Lab 06/18/20 1823 06/20/20 0133 06/21/20 0050  WBC 9.0 7.0 7.3  NEUTROABS 7.3  --   --   HGB 12.8* 12.4* 13.2  HCT 37.9* 37.8* 39.2  MCV 91.8 93.8 92.5  PLT 488* 466* 446*   No results for input(s): CKTOTAL, CKMB, CKMBINDEX, TROPONINI in the last 168 hours. No results for input(s): LABPROT, INR in the last 72 hours. Recent Labs    06/18/20 1959  COLORURINE YELLOW  LABSPEC 1.014  PHURINE 5.0  GLUCOSEU NEGATIVE  HGBUR NEGATIVE  BILIRUBINUR NEGATIVE  KETONESUR NEGATIVE  PROTEINUR NEGATIVE  NITRITE NEGATIVE  LEUKOCYTESUR NEGATIVE       Component Value Date/Time   CHOL 196 06/09/2020 1809   TRIG 153 (H) 06/12/2020 0420   HDL 52 06/09/2020 1809   CHOLHDL 3.8 06/09/2020 1809   VLDL 66 (H) 06/09/2020 1809   LDLCALC 78 06/09/2020 1809   Lab Results  Component Value Date   HGBA1C 5.5 06/09/2020      Component Value Date/Time    LABOPIA NONE DETECTED 06/09/2020 2256   COCAINSCRNUR NONE DETECTED 06/09/2020 2256   LABBENZ NONE DETECTED 06/09/2020 2256   AMPHETMU NONE DETECTED 06/09/2020 2256   THCU NONE DETECTED 06/09/2020 2256   LABBARB NONE DETECTED 06/09/2020 2256    No results for input(s): ETH in the last 168 hours.  I have personally reviewed the radiological images below and agree with the radiology interpretations.  CT HEAD WO CONTRAST  Result Date: 06/19/2020 CLINICAL DATA:  History of right thalamic hemorrhage EXAM: CT HEAD WITHOUT CONTRAST TECHNIQUE: Contiguous axial images were obtained from the base of the skull through the vertex without intravenous contrast. COMPARISON:  06/18/2020 FINDINGS: Brain: The known right thalamic hemorrhage seen on prior study is grossly unchanged on this exam. There is mild increased surrounding edema, with persistent mass effect upon the third ventricle. Stable mild ventriculomegaly with minimal blood products layering dependently within the occipital horns of the lateral ventricles. No frank hydrocephalus at this time. No acute extra-axial fluid collections. Vascular: No hyperdense vessel or unexpected calcification. Skull: Normal. Negative for fracture or focal lesion. Sinuses/Orbits: No acute finding. Other: None. IMPRESSION: 1. Stable  size of the right thalamic hemorrhage, with minimal increase in surrounding edema. 2. Stable mass effect upon the third ventricle, with stable mild dilation of the lateral ventricles without frank hydrocephalus. 3. Stable decompression of the thalamic hemorrhage into the ventricular system, with minimal blood products layering dependently within the occipital horns as before. Electronically Signed   By: Sharlet SalinaMichael  Brown M.D.   On: 06/19/2020 21:43   CT Head Wo Contrast  Result Date: 06/18/2020 CLINICAL DATA:  Stroke follow-up. EXAM: CT HEAD WITHOUT CONTRAST TECHNIQUE: Contiguous axial images were obtained from the base of the skull through the vertex  without intravenous contrast. COMPARISON:  Jun 18, 2019. FINDINGS: Brain: Right thalamic hemorrhage is unchanged in size. Similar intraventricular extension of hemorrhage with small volume of hemorrhage layering in the occipital horns bilaterally. Similar mild ventriculomegaly with mild rounding of the temporal horns. There is similar surrounding edema. Similar leftward midline shift with effacement of the third ventricle. No evidence of acute/interval large vascular territory infarct. Similar patchy white matter hypoattenuation, most likely related to chronic microvascular ischemic disease. Vascular: No hyperdense vessels identified. Skull: No acute fracture. Sinuses/Orbits: Visualized sinuses are largely clear. No acute orbital abnormality. Other: No mastoid effusions. IMPRESSION: 1. Similar size of the right thalamic hemorrhage with similar intraventricular extension. Similar leftward midline shift. 2. Similar mild ventriculomegaly. Recommend continued attention on follow-up. Electronically Signed   By: Feliberto HartsFrederick S Jones MD   On: 06/18/2020 18:38   CT HEAD WO CONTRAST  Result Date: 06/17/2020 CLINICAL DATA:  Intracranial hemorrhage EXAM: CT HEAD WITHOUT CONTRAST TECHNIQUE: Contiguous axial images were obtained from the base of the skull through the vertex without intravenous contrast. COMPARISON:  CT head 06/11/2020 FINDINGS: Brain: Right thalamic hemorrhage unchanged in size. Mild intraventricular hemorrhage shows partial clearing. Mild midline shift to the left is unchanged. Mild ventricular dilatation has progressed in the interval. Patchy white matter hypodensity bilaterally most consistent with chronic microvascular ischemia. No acute ischemic infarct. Vascular: Negative for hyperdense vessel Skull: Negative Sinuses/Orbits: Mild mucosal edema paranasal sinuses. Left cataract extraction. Chronic nasal bone fracture unchanged. Other: None IMPRESSION: Right thalamic hematoma stable Partial clearing of  intraventricular hemorrhage. Mild ventricular enlargement, with progression since the prior study. Electronically Signed   By: Marlan Palauharles  Clark M.D.   On: 06/17/2020 09:18   CT HEAD WO CONTRAST  Result Date: 06/11/2020 CLINICAL DATA:  Left arm weakness. Intracranial hemorrhage follow-up EXAM: CT HEAD WITHOUT CONTRAST TECHNIQUE: Contiguous axial images were obtained from the base of the skull through the vertex without intravenous contrast. COMPARISON:  Head CT 06/10/2020 FINDINGS: Brain: Unchanged intraparenchymal hemorrhage centered in the right thalamus with intraventricular extension. Size and configuration of the ventricles is unchanged. Mild leftward midline shift is also unchanged. No new site of hemorrhage. Vascular: No abnormal hyperdensity of the major intracranial arteries or dural venous sinuses. No intracranial atherosclerosis. Skull: The visualized skull base, calvarium and extracranial soft tissues are normal. Sinuses/Orbits: No fluid levels or advanced mucosal thickening of the visualized paranasal sinuses. No mastoid or middle ear effusion. The orbits are normal. IMPRESSION: 1. Unchanged size and configuration of right thalamic intraparenchymal hemorrhage with intraventricular extension. 2. Unchanged mild leftward midline shift. Electronically Signed   By: Deatra RobinsonKevin  Herman M.D.   On: 06/11/2020 18:49   CT HEAD WO CONTRAST  Result Date: 06/09/2020 CLINICAL DATA:  Intracranial hemorrhage follow EXAM: CT HEAD WITHOUT CONTRAST TECHNIQUE: Contiguous axial images were obtained from the base of the skull through the vertex without intravenous contrast. COMPARISON:  06/09/2020 at 4:10  p.m. FINDINGS: Brain: Unchanged appearance of intracranial hemorrhage in the right thalamus with extension into the ventricles. 4 mm leftward midline shift is also unchanged. Vascular: No abnormal hyperdensity of the major intracranial arteries or dural venous sinuses. No intracranial atherosclerosis. Skull: The visualized  skull base, calvarium and extracranial soft tissues are normal. Sinuses/Orbits: No fluid levels or advanced mucosal thickening of the visualized paranasal sinuses. No mastoid or middle ear effusion. The orbits are normal. IMPRESSION: 1. Unchanged appearance of right thalamic intracranial hemorrhage with extension into the ventricles. 2. Unchanged 4 mm leftward midline shift. Electronically Signed   By: Deatra Robinson M.D.   On: 06/09/2020 22:18   CT SOFT TISSUE NECK WO CONTRAST  Result Date: 06/09/2020 CLINICAL DATA:  Foreign body in the neck. EXAM: CT NECK WITHOUT CONTRAST TECHNIQUE: Multidetector CT imaging of the neck was performed following the standard protocol without intravenous contrast. COMPARISON:  None. FINDINGS: PHARYNX AND LARYNX: The nasopharynx, oropharynx and larynx are normal. Visible portions of the oral cavity, tongue base and floor of mouth are normal. Normal epiglottis, vallecula and pyriform sinuses. The larynx is normal. No retropharyngeal abscess, effusion or lymphadenopathy. SALIVARY GLANDS: Normal parotid, submandibular and sublingual glands. THYROID: Normal. LYMPH NODES: No enlarged or abnormal density lymph nodes. VASCULAR: Major cervical vessels are patent. Left IJ central venous catheter. LIMITED INTRACRANIAL: Normal. VISUALIZED ORBITS: Normal. MASTOIDS AND VISUALIZED PARANASAL SINUSES: No fluid levels or advanced mucosal thickening. No mastoid effusion. SKELETON: No bony spinal canal stenosis. No lytic or blastic lesions. UPPER CHEST: Clear. OTHER: None. IMPRESSION: Normal CT of the neck. Electronically Signed   By: Deatra Robinson M.D.   On: 06/09/2020 22:22   CT CHEST WO CONTRAST  Result Date: 06/09/2020 CLINICAL DATA:  Mediastinal widening on chest radiograph, possible foreign body EXAM: CT CHEST WITHOUT CONTRAST TECHNIQUE: Multidetector CT imaging of the chest was performed following the standard protocol without IV contrast. COMPARISON:  Radiograph 06/09/2020, 01/02/2018,  contemporary neck radiograph FINDINGS: Cardiovascular: Left IJ approach central venous catheter tip terminates near the level of the left brachiocephalic-superior caval junction. Cardiac size is top normal no sizable pericardial effusion. Atherosclerotic plaque within the normal caliber aorta. No hyperdense mural thickening to suggest an intra mural hematoma. No periaortic stranding or hemorrhage is evident on this unenhanced exam. Central pulmonary arteries are normal caliber. Luminal evaluation of the vasculature is precluded in the absence of contrast media. Mediastinum/Nodes: Some pronounced upper mediastinal widening seen on the chest radiograph is likely a combination of portable technique and abundant mediastinal fat noted on this exam. No mediastinal fluid or gas. Normal thyroid gland and thoracic inlet. No acute abnormality of the trachea or esophagus. No worrisome mediastinal or axillary adenopathy. Hilar nodal evaluation is limited in the absence of intravenous contrast media. Lungs/Pleura: Low lung volumes and atelectasis with more bandlike subsegmental atelectasis towards the lung bases. No effusion. No pneumothorax. Upper Abdomen: No acute abnormalities present in the visualized portions of the upper abdomen. Musculoskeletal: No acute osseous abnormality or suspicious osseous lesion. Degenerative changes are present in the imaged spine and shoulders. No worrisome chest wall mass. No unexpected radiodense foreign bodies. IMPRESSION: 1. No expected radiopaque foreign body seen in the chest. 2. Left IJ approach central venous catheter tip terminates at the left brachiocephalic-superior caval junction. 3. Mediastinal widening present on comparison chest radiograph is likely a combination artifactual accentuation due to portable technique and abundant mediastinal fat noted on this exam. No worrisome aortic dilatation, or other acute aortic abnormality within the limitations of  an unenhanced CT. 4. Basilar  atelectatic changes. Electronically Signed   By: Kreg Shropshire M.D.   On: 06/09/2020 23:26   MR ANGIO HEAD WO CONTRAST  Result Date: 06/10/2020 CLINICAL DATA:  Cerebral hemorrhage. EXAM: MRA HEAD WITHOUT CONTRAST TECHNIQUE: Angiographic images of the Circle of Willis were acquired using MRA technique without intravenous contrast. COMPARISON:  No pertinent prior exam. FINDINGS: Motion limited exam.  Within this limitation: Anterior circulation: No large vessel occlusion or evidence of proximal hemodynamically significant stenosis. No evidence of hemodynamically significant proximal stenosis, although evaluation limited by motion. No aneurysm identified. No evidence of vascular malformation in the region of the right thalamic hemorrhage, although acute blood products limit evaluation. There is intrinsic T1 hyperintensity within the hemorrhage, which also limits evaluation. Posterior circulation: No large vessel occlusion or evidence of proximal hemodynamically significant stenosis. Bilateral posterior communicating arteries. No aneurysm. IMPRESSION: 1. No evidence of a vascular malformation in the region of the right thalamic hemorrhage, although acute blood products limit evaluation. 2. No large vessel occlusion or evidence of proximal hemodynamically significant stenosis on this motion limited exam. Electronically Signed   By: Feliberto Harts MD   On: 06/10/2020 12:02   MR BRAIN W WO CONTRAST  Result Date: 06/10/2020 CLINICAL DATA:  Hemorrhage. EXAM: MRI HEAD WITHOUT AND WITH CONTRAST TECHNIQUE: Multiplanar, multiecho pulse sequences of the brain and surrounding structures were obtained without and with intravenous contrast. CONTRAST:  97mL GADAVIST GADOBUTROL 1 MMOL/ML IV SOLN COMPARISON:  CT Jun 09, 2020. FINDINGS: Mildly motion limited exam. Brain: When comparing across modalities, no substantial change in size/extent of an acute right thalamic hemorrhage, measuring up to approximately 3.8 by 2.4 cm. The  hemorrhage demonstrates areas of intrinsic T1 hyperintensity. Similar extraventricular extension with blood products in right greater than left lateral ventricles, third ventricle, and fourth ventricle. No progressive ventriculomegaly. Surrounding edema with similar 4 mm of leftward midline shift. Outside of the hemorrhage itself, and no confluent restricted diffusion to suggest acute infarct. No visible mass lesion, although acute blood products limit evaluation. Moderate patchy T2/FLAIR hyperintensities within the white matter and pons, nonspecific but most likely related to chronic microvascular ischemic disease. Vascular: Further evaluated on concurrent MRA. Skull and upper cervical spine: Normal marrow signal. Sinuses/Orbits: Clear sinuses.  No acute orbital finding. Other: No mastoid effusions. IMPRESSION: 1. When comparing across modalities, no substantial change in size/extent of an acute right thalamic hemorrhage with intraventricular extension, as detailed above. Similar 4 mm of leftward midline shift. No progressive ventriculomegaly. 2. No specific evidence of acute infarct or visible mass lesion, although acute blood products limits evaluation. A follow-up MRI after resolution of hemorrhage could further evaluate if clinically indicated. 3. Moderate chronic microvascular ischemic disease. Electronically Signed   By: Feliberto Harts MD   On: 06/10/2020 11:51   DG Chest Portable 1 View  Result Date: 06/09/2020 CLINICAL DATA:  55 year old male with history of central line placement. EXAM: PORTABLE CHEST 1 VIEW COMPARISON:  Chest x-ray 01/02/2018. FINDINGS: Left-sided internal jugular central venous catheter with tip terminating near the junction of the innominate vein with the superior vena cava. Lung volumes are low. No consolidative airspace disease. No pleural effusions. No pneumothorax. No evidence of pulmonary edema. Heart size is mildly enlarged. Marked widening of the mediastinum, new compared  to the prior examination. IMPRESSION: 1. Left internal jugular central venous catheter in position, as above, with profound widening of the mediastinum. If there is clinical concern for aortic dissection and/or mediastinal hematoma, further  evaluation with chest CTA would be recommended. Electronically Signed   By: Trudie Reed M.D.   On: 06/09/2020 18:11   EEG adult  Result Date: 06/18/2020 Charlsie Quest, MD     06/18/2020 10:26 PM Patient Name: [name] MRN: [id number] Epilepsy Attending: Charlsie Quest Referring Physician/Provider: Dr Delia Heady Date: 06/18/2020 Duration: 21.18 mins Patient history:  55yo M with acute neurological change in his level of consciousness with leftward head and eye deviation. EEG to evaluate seizure activity. Level of alertness: Awake AEDs during EEG study: VPA Technical aspects: This EEG study was done with scalp electrodes positioned according to the 10-20 International system of electrode placement. Electrical activity was acquired at a sampling rate of 500Hz  and reviewed with a high frequency filter of 70Hz  and a low frequency filter of 1Hz . EEG data were recorded continuously and digitally stored. Description: No posterior dominant rhythm was seen. EEG showed continuous generalized and lateralized right hemisphere 3 to 6 Hz theta-delta slowing admixed with generalized 15-18Hz  beta activity. Hyperventilation and photic stimulation were not performed.   ABNORMALITY - Continuous slow, generalized  and lateralized right hemisphere IMPRESSION: This study is  suggestive of suggestive of cortical dysfunction arising from right hemisphere likely secondary to underlying bleed as well as moderate diffuse encephalopathy, nonspecific etiology. No seizures or definite epileptiform discharges were seen throughout the recording. Dr Derry Lory was notified. Charlsie Quest   ECHOCARDIOGRAM COMPLETE  Result Date: 06/10/2020    ECHOCARDIOGRAM REPORT   Patient Name:   [name] Date of Exam: 06/10/2020 Medical Rec #:  [id number]      Height:       70.0 in Accession #:    [id number]     Weight:       251.1 lb Date of Birth:  [birth year]      BSA:          2.299 m Patient Age:    54 years       BP:           140/71 mmHg Patient Gender: M              HR:           102 bpm. Exam Location:  Inpatient Procedure: 2D Echo, Color Doppler and Cardiac Doppler Indications:    CVA  History:        Patient has prior history of Echocardiogram examinations, most                 recent 08/12/2014. Cardiomyopathy and CHF,                 Signs/Symptoms:Shortness of Breath; Risk Factors:Hypertension,                 Dyslipidemia and Diabetes.  Sonographer:    Neomia Dear RDCS Referring Phys: [id number] ASHISH ARORA  Sonographer Comments: Patient is morbidly obese and Technically difficult study due to poor echo windows. IMPRESSIONS  1. Left ventricular ejection fraction, by estimation, is 65 to 70%. The left ventricle has normal function. The left ventricle has no regional wall motion abnormalities. There is severe left ventricular hypertrophy. Indeterminate diastolic filling due to E-A fusion.  2. Right ventricular systolic function is normal. The right ventricular size is normal. There is normal pulmonary artery systolic pressure. The estimated right ventricular systolic pressure is 28.4 mmHg.  3. The mitral valve is normal in structure. Trivial mitral valve regurgitation. No evidence of mitral stenosis.  4. The  aortic valve is normal in structure. Aortic valve regurgitation is trivial. No aortic stenosis is present.  5. The inferior vena cava is normal in size with greater than 50% respiratory variability, suggesting right atrial pressure of 3 mmHg.  6. Increased flow velocities may be secondary to anemia, thyrotoxicosis, hyperdynamic or high flow state. Conclusion(s)/Recommendation(s): No intracardiac source of embolism detected on this transthoracic study. A transesophageal echocardiogram is  recommended to exclude cardiac source of embolism if clinically indicated. FINDINGS  Left Ventricle: Left ventricular ejection fraction, by estimation, is 65 to 70%. The left ventricle has normal function. The left ventricle has no regional wall motion abnormalities. The left ventricular internal cavity size was normal in size. There is  severe left ventricular hypertrophy. Indeterminate diastolic filling due to E-A fusion. Right Ventricle: The right ventricular size is normal. No increase in right ventricular wall thickness. Right ventricular systolic function is normal. There is normal pulmonary artery systolic pressure. The tricuspid regurgitant velocity is 2.52 m/s, and  with an assumed right atrial pressure of 3 mmHg, the estimated right ventricular systolic pressure is 28.4 mmHg. Left Atrium: Left atrial size was normal in size. Right Atrium: Right atrial size was normal in size. Pericardium: There is no evidence of pericardial effusion. Mitral Valve: The mitral valve is normal in structure. Trivial mitral valve regurgitation. No evidence of mitral valve stenosis. MV peak gradient, 13.0 mmHg. The mean mitral valve gradient is 4.0 mmHg. Tricuspid Valve: The tricuspid valve is normal in structure. Tricuspid valve regurgitation is trivial. No evidence of tricuspid stenosis. Aortic Valve: The aortic valve is normal in structure. Aortic valve regurgitation is trivial. No aortic stenosis is present. Aortic valve mean gradient measures 7.0 mmHg. Aortic valve peak gradient measures 13.8 mmHg. Aortic valve area, by VTI measures 3.77 cm. Pulmonic Valve: The pulmonic valve was not well visualized. Pulmonic valve regurgitation is trivial. No evidence of pulmonic stenosis. Aorta: The aortic root is normal in size and structure. Venous: The inferior vena cava is normal in size with greater than 50% respiratory variability, suggesting right atrial pressure of 3 mmHg. IAS/Shunts: No atrial level shunt detected by color flow  Doppler.  LEFT VENTRICLE PLAX 2D LVIDd:         4.00 cm     Diastology LVIDs:         2.00 cm     LV e' medial:    4.57 cm/s LV PW:         1.70 cm     LV E/e' medial:  16.3 LV IVS:        1.60 cm     LV e' lateral:   7.83 cm/s LVOT diam:     2.30 cm     LV E/e' lateral: 9.5 LV SV:         106 LV SV Index:   46 LVOT Area:     4.15 cm  LV Volumes (MOD) LV vol d, MOD A2C: 78.9 ml LV vol d, MOD A4C: 93.0 ml LV vol s, MOD A2C: 23.9 ml LV vol s, MOD A4C: 22.9 ml LV SV MOD A2C:     55.0 ml LV SV MOD A4C:     93.0 ml LV SV MOD BP:      63.6 ml RIGHT VENTRICLE RV Basal diam:  3.70 cm RV Mid diam:    2.90 cm RV S prime:     25.20 cm/s TAPSE (M-mode): 3.7 cm LEFT ATRIUM  Index       RIGHT ATRIUM           Index LA diam:        3.20 cm 1.39 cm/m  RA Area:     13.30 cm LA Vol (A2C):   65.6 ml 28.53 ml/m RA Volume:   31.10 ml  13.53 ml/m LA Vol (A4C):   59.8 ml 26.01 ml/m LA Biplane Vol: 63.6 ml 27.66 ml/m  AORTIC VALVE                    PULMONIC VALVE AV Area (Vmax):    3.19 cm     PV Vmax:       1.64 m/s AV Area (Vmean):   3.23 cm     PV Vmean:      113.000 cm/s AV Area (VTI):     3.77 cm     PV VTI:        0.256 m AV Vmax:           186.00 cm/s  PV Peak grad:  10.8 mmHg AV Vmean:          124.000 cm/s PV Mean grad:  6.0 mmHg AV VTI:            0.282 m AV Peak Grad:      13.8 mmHg AV Mean Grad:      7.0 mmHg LVOT Vmax:         143.00 cm/s LVOT Vmean:        96.300 cm/s LVOT VTI:          0.256 m LVOT/AV VTI ratio: 0.91  AORTA Ao Root diam: 3.20 cm Ao Asc diam:  3.20 cm Ao Arch diam: 3.2 cm MITRAL VALVE                TRICUSPID VALVE MV Area (PHT): 4.63 cm     TR Peak grad:   25.4 mmHg MV Area VTI:   3.39 cm     TR Vmax:        252.00 cm/s MV Peak grad:  13.0 mmHg MV Mean grad:  4.0 mmHg     SHUNTS MV Vmax:       1.80 m/s     Systemic VTI:  0.26 m MV Vmean:      94.4 cm/s    Systemic Diam: 2.30 cm MV Decel Time: 164 msec MV E velocity: 74.30 cm/s MV A velocity: 106.00 cm/s MV E/A ratio:  0.70 Weston Brass MD Electronically signed by Weston Brass MD Signature Date/Time: 06/10/2020/11:44:08 AM    Final    CT HEAD CODE STROKE WO CONTRAST  Result Date: 06/09/2020 CLINICAL DATA:  Code stroke.  Rightward gaze.  Left arm weakness. EXAM: CT HEAD WITHOUT CONTRAST TECHNIQUE: Contiguous axial images were obtained from the base of the skull through the vertex without intravenous contrast. COMPARISON:  None. FINDINGS: Brain: An acute hemorrhage centered in the right thalamus measures 3.6 x 2.8 x 2.7 cm (approximate volume of 14 mL). There is intraventricular extension with a small amount of hemorrhage in the right greater than left lateral ventricles, third ventricle, and fourth ventricle. The ventricles are not grossly dilated, however prior imaging is not available to no the patient's baseline ventricle size. There is mild edema surrounding the right thalamic hemorrhage. There is 4 mm of leftward midline shift at the level of the thalami. Hypodensities in the cerebral white matter bilaterally are nonspecific but likely reflect mild chronic small vessel  ischemic disease given the patient's vascular risk factors. No acute cortically based infarct or extra-axial fluid collection is evident. Vascular: No hyperdense vessel. Skull: No fracture or suspicious osseous lesion. Sinuses/Orbits: Minimal mucosal thickening in the right sphenoid sinus. Clear mastoid air cells. Left cataract extraction. Rightward gaze. Other: None. ASPECTS [facility] Stroke Program Early CT Score) Not scored in the presence of acute hemorrhage. IMPRESSION: 1. 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 2. 4 mm of leftward midline shift. 3. Mild chronic small vessel ischemic disease. These results were communicated to Dr. Wilford Corner at 4:17 pm on 06/09/2020 by text page via the [facility] messaging system. Electronically Signed   By: Sebastian Ache M.D.   On: 06/09/2020 16:21   VAS US CAROTID  Result Date: 06/12/2020 Carotid Arterial  Duplex Study Patient Name:  [name]  Date of Exam:   06/10/2020 Medical Rec #: [id number]       Accession #:    [id number] Date of Birth: 03-Jul-1965       Patient Gender: M Patient Age:   54Y Exam Location:  [facility] Procedure:      VAS US CAROTID Referring Phys: [id number] Marvel Plan --------------------------------------------------------------------------------  Indications:       CVA. Risk Factors:      Hypertension, hyperlipidemia. Limitations        Today's exam was limited due to the body habitus of the                    patient, the patient's respiratory variation, patient                    movement, patient somnolence and a central line. Comparison Study:  No prior studies. Performing Technologist: Chanda Busing RVT  Examination Guidelines: A complete evaluation includes B-mode imaging, spectral Doppler, color Doppler, and power Doppler as needed of all accessible portions of each vessel. Bilateral testing is considered an integral part of a complete examination. Limited examinations for reoccurring indications may be performed as noted.  Right Carotid Findings: +----------+--------+--------+--------+-----------------------+--------+           PSV cm/sEDV cm/sStenosisPlaque Description     Comments +----------+--------+--------+--------+-----------------------+--------+ CCA Prox  101     12                                              +----------+--------+--------+--------+-----------------------+--------+ CCA Distal82      11              smooth and heterogenous         +----------+--------+--------+--------+-----------------------+--------+ ICA Prox  58      18              smooth and heterogenous         +----------+--------+--------+--------+-----------------------+--------+ ICA Distal57      17                                     tortuous +----------+--------+--------+--------+-----------------------+--------+ ECA       136     19                                               +----------+--------+--------+--------+-----------------------+--------+ +----------+--------+-------+--------+-------------------+  PSV cm/sEDV cmsDescribeArm Pressure (mmHG) +----------+--------+-------+--------+-------------------+ Subclavian120                                        +----------+--------+-------+--------+-------------------+ +---------+--------+--+--------+--+---------+ VertebralPSV cm/s56EDV cm/s13Antegrade +---------+--------+--+--------+--+---------+  Left Carotid Findings: +----------+--------+--------+--------+-----------------------+--------+           PSV cm/sEDV cm/sStenosisPlaque Description     Comments +----------+--------+--------+--------+-----------------------+--------+ CCA Prox  58      8               smooth and heterogenous         +----------+--------+--------+--------+-----------------------+--------+ CCA Distal69      11              smooth and heterogenous         +----------+--------+--------+--------+-----------------------+--------+ ICA Prox  69      20              smooth and heterogenoustortuous +----------+--------+--------+--------+-----------------------+--------+ ICA Distal112     34                                     tortuous +----------+--------+--------+--------+-----------------------+--------+ ECA       99      14                                              +----------+--------+--------+--------+-----------------------+--------+ +----------+--------+--------+--------+-------------------+           PSV cm/sEDV cm/sDescribeArm Pressure (mmHG) +----------+--------+--------+--------+-------------------+ [id number]                                         +----------+--------+--------+--------+-------------------+ +---------+--------+--+--------+--+---------+ VertebralPSV cm/s41EDV cm/s10Antegrade +---------+--------+--+--------+--+---------+    Summary: Right Carotid: Velocities in the right ICA are consistent with a 1-39% stenosis. Left Carotid: Velocities in the left ICA are consistent with a 1-39% stenosis. Vertebrals: Bilateral vertebral arteries demonstrate antegrade flow. *See table(s) above for measurements and observations.  Electronically signed by Delia Heady MD on 06/12/2020 at 2:03:49 PM.    Final    [place] EKG SITE RITE  Result Date: 06/09/2020 If Site Rite image not attached, placement could not be confirmed due to current cardiac rhythm.   PHYSICAL EXAM  Temp:  [98.4 F (36.9 C)-98.7 F (37.1 C)] 98.7 F (37.1 C) (05/30 0830) Pulse Rate:  [68-90] 86 (05/30 0800) Resp:  [12-26] 18 (05/30 0800) BP: (132-177)/(64-99) 161/99 (05/30 0700) SpO2:  [94 %-97 %] 94 % (05/30 0600) Weight:  [106.3 kg] 106.3 kg (05/30 0500)  General -obese middle-aged African-American male, not in acute distress  Ophthalmologic - fundi not visualized due to noncooperation.  Cardiovascular - Regular rhythm and rate.  Neuro - awake alert with eyes open, orientated to age, time, place and people. No aphasia, fluent language, able to name and repeat, following all simple commands. No gaze palsy but seems b/l lateral gaze incomplete, tracking bilaterally, visual field full, PERRL. Left facial droop. Tongue midline. RUE at least 4/5 and RLE at least 3/5, LUE 1/5 and LLE 2/5. Sensation symmetrical bilaterally, right FTN intact, gait not tested.     ASSESSMENT/PLAN [name] is a 55 y.o. male with history of hypertension, obesity,  cardiomyopathy, hyperlipidemia, CHF admitted for right-sided gaze, right-sided weakness numbness, fall. No tPA given due to ICH.    ICH:  right thalamic ICH with IVH, likely due to uncontrolled hypertension New altered mental status with depressed consciousness and left gaze and head deviation possibly seizures  CT head showed right thalamic ICH with IVH with 4 mm midline shift  Repeat CT head no hematoma  expansion  MRI stable right thalamic ICH with IVH and midline shift  MRA no aneurysm or AVM  CT repeat x 2 stable right thalamic hemorrhage with mild intraventricular extension.  2D Echo EF 65-70%  CUS unremarkable  LDL 78  HgbA1c 5.5  UDS negative  SCDs for VTE prophylaxis  No antithrombotic prior to admission, now on No antithrombotic due to ICH  Ongoing aggressive stroke risk factor management  Therapy recommendations: CIR  Disposition: Pending  Seizure   Likely partial complex seizure 5/27  Spot EEG no seizure recorded  On keppra 500 bid now, changed to po  Received depakote 5/26 evening for HA, depakote level 45 - no depakote for seizure given side effect of weight gain.  LTM EEG no seizure, d/c 5/29  Seizure precautions  Hypertensive emergency . Home medication metoprolol 100 twice daily, HCTZ, losartan . Stable on the high end . BP goal < 160 . Off Cleviprex  . amlodipine 10, labetalol 100 Bid, losartan 50 bid . Clonidine 0.2 tid ->low BP episode -> 0.1 bid->0.1 tid  Long term BP goal normotensive  Hyperlipidemia  Home meds: Lipitor 40  LDL 78, goal < 70  Now on no statin given recent ICH  Resume statin at discharge to CIR  AKI  Cre 0.9->1.68->1.19->1.92->1.41->1.38->1.19  Encourage po intake   BMP monitoring  Dysphagia  Speech on board  dys 3 and thin liquid  Aspiration precaution  Alcohol abuse  About 24 drinks per week  On CIWA protocol  FA/MVI/B1  Sundowning   Delirium and confusion at evening 5/28  Put on seroquel 25mg  Qhs  Tolerating well  Other Stroke Risk Factors  Obesity, Body mass index is 33.63 kg/m.   Likely obstructive sleep apnea, undiagnosed  Other Active Problems    Marvel Plan, MD PhD Stroke Neurology 06/21/2020 11:49 AM    To contact Stroke Continuity provider, please refer to [email or web address]. After hours, contact General Neurology

## 2020-06-21 NOTE — Progress Notes (Signed)
PROGRESS NOTE    [name]  NID:[id number] DOB: [birth year] DOA: 06/09/2020 PCP: Pcp, No   Brief Narrative:  HPI on 06/09/2020 by Dr. Milon Dikes (Neurology) [name] is a 55 y.o. male with a PMHx of obesity, HTN, non ischemic dilated cardiomyopathy, HLD, Cs/dHF who presents to ED via EMS as a code stroke. LKW 2200 hours 06/08/20. Per patient, he woke up around 0700 hours, but was weak. His wife noted via Ring doorbell that patient did not go to work. She left work early and came home at 1500 hours to find patient lying in the bathroom. He had a right gaze preference, right sided weakness, and right sided numbness. 911 was called. BP 200s in the field, and higher up to 230s here. Bleed was suspected due to patient c/o HA with high BP.   No personal of FMHx of stroke.   After brief exam on ED bridge with airway clearance, patient was taken emergently to CT. 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 39mm shift.   Patient was taken back to ED and after placement of [place] IV, Labetalol 10mg  IV push was given and Clexiprex was started. Target BP 120-140. Patient became more sleepy in ED room and was snoring at times. ED MD informed that patient may need intubation. His O2 sats stayed 100% with 15L NRB.   A bolus of 3% saline was ordered but additional IV being started.   Wife came and Dr. spoke with her. She states he takes his medicines routinely, but patient stated he did not. He is not on any anticoagulation or antiplatelet medications.   In review of chart, it appears he was first started on anti hypertensives by [facility] in 2020. It also appears he was at [facility] multiple times for for medication refills. He was referred to PCP, but unknown if he went. NP does not see OV note for a PCP, but he may be seeing someone without Epic EMR.   Interim history  Patient presented with ICH and was initially admitted by neurology service.  He was admitted  to the ICU however has now been turned over to [facility] for continued care.  Patient's hospital course was also complicated by seizure disorder.  Currently he is on AEDs.  Pending CIR placement. Assessment & Plan   ICH -Patient was admitted with right-sided gaze, right-sided weakness and numbness with fall -CT head showed right thalamic ICH with IVH with 4 mm midline shift -MRI showed right thalamic ICH with IVH and midline shift -MRA showed no aneurysm or AVM -Repeat CT x2 showed stable right thalamic hemorrhage with mild intraventricular extension -Echocardiogram showed an EF of 65 to 70%, no regional motion abnormalities, severe LVH, indeterminate diastolic filling, trivial mitral and aortic valve regurg -Carotid ultrasound was unremarkable -LDL 78, hemoglobin A1c 5.5 -UDS was unremarkable -Patient has not been on any type of antithrombotic given ICH -PT and OT recommending inpatient rehab  Seizure activity -Became altered and noted to have depressed consciousness with left gaze and head deviation on 5/27 -LTM EEG showed no seizure activity -Currently on Keppra -Continue seizure precautions  Hypertensive emergency/uncontrolled hypertension -Home medications included metoprolol 100 mg twice daily, HCTZ, losartan -Goal SBP less than 160 -Patient did require Cleviprex -Currently on amlodipine, labetalol, losartan, clonidine -Continue to monitor closely  Hyperlipidemia -Lipid panel: Total cholesterol 96, HDL 52, LDL 78, triglycerides initially 332 however improved to 153 on 06/12/2020 -Patient normally on statin at home -  Currently statin held due to possibility of worsening ICH. -Discussed with neurology, Dr. Roda Shutters, patient can be restarted on statin at discharge  Acute kidney injury -Creatinine peaked to 1.92 -Creatinine improved down to 1.19, continue to monitor BMP  Dysphagia -Speech therapy consulted, currently on dysphagia 3 diet with thin liquids -Continue aspiration  precautions  Alcohol abuse -Patient drinks about 24 alcoholic drinks per week -Continue CIWA protocol  -Currently on folic acid, multivitamin and thiamine  Sundowning -Patient with delirium and confusion in the evening and was started on Seroquel -Continue to monitor closely  Obesity -BMI 33.63 -Patient to follow-up with PCP on discharge for lifestyle modifications  Possible obstructive sleep apnea -Patient will need sleep study as an outpatient  Hyperkalemia -Mild, potassium 5.7 -Will give dose of Kayexalate and monitor BMP  Hyponatremia -Appears to be improving, sodium 134 today, continue to monitor BMP  DVT Prophylaxis  SCDs  Code Status: Full  Family Communication: None at bedside  Disposition Plan:  Status is: Inpatient  Remains inpatient appropriate because:Inpatient level of care appropriate due to severity of illness   Dispo: The patient is from: Home              Anticipated d/c is to: CIR              Patient currently is not medically stable to d/c.   Difficult to place patient No  Consultants Neurology PCCM Inpatient rehab  Procedures  Echocardiogram Carotid Doppler EEG  Antibiotics   Anti-infectives (From admission, onward)   None      Subjective:   [name] seen and examined today.  Patient with no complaints this morning.  Tells me "do not tell my business".  Denies chest pain or shortness of breath, abdominal pain, dizziness or headache at this time.      Objective:   Vitals:   06/21/20 0600 06/21/20 0700 06/21/20 0800 06/21/20 0830  BP: (!) 177/79 (!) 161/99    Pulse: 81 76 86   Resp: 15 16 18    Temp:    98.7 F (37.1 C)  TempSrc:    Oral  SpO2: 94%     Weight:      Height:        Intake/Output Summary (Last 24 hours) at 06/21/2020 0959 Last data filed at 06/21/2020 0500 Gross per 24 hour  Intake 35.59 ml  Output 1251 ml  Net -1215.41 ml   Filed Weights   06/19/20 0606 06/20/20 0500 06/21/20 0500  Weight: 109.5 kg  106.1 kg 106.3 kg    Exam  General: Well developed, chronically ill-appearing, NAD  HEENT: NCAT, mucous membranes moist.   Cardiovascular: S1 S2 auscultated, RRR.  Respiratory: Clear to auscultation bilaterally with equal chest rise  Abdomen: Soft, obese, nontender, nondistended, + bowel sounds  Extremities: warm dry without cyanosis clubbing or edema  Neuro: AAOx3, RUE strength 4/5, RLE strength 3/5, LUE 1/5, LLE 2/5.  Left facial droop, issues with bilateral gaze and tracking   Data Reviewed: I have personally reviewed following labs and imaging studies  CBC: Recent Labs  Lab 06/18/20 1823 06/20/20 0133 06/21/20 0050  WBC 9.0 7.0 7.3  NEUTROABS 7.3  --   --   HGB 12.8* 12.4* 13.2  HCT 37.9* 37.8* 39.2  MCV 91.8 93.8 92.5  PLT 488* 466* 446*   Basic Metabolic Panel: Recent Labs  Lab 06/18/20 1823 06/19/20 0552 06/20/20 0133 06/21/20 0050  NA 128* 132* 133* 134*  K 5.4* 4.4 4.4 5.7*  CL  95* 98 101 101  CO2 23 24 24 24   GLUCOSE 117* 84 99 107*  BUN 38* 38* 32* 23*  CREATININE 1.92* 1.41* 1.38* 1.19  CALCIUM 9.5 9.6 9.3 9.5  MG 2.6*  --   --   --   PHOS 5.6*  --   --   --    GFR: Estimated Creatinine Clearance: 84.6 mL/min (by C-G formula based on SCr of 1.19 mg/dL). Liver Function Tests: Recent Labs  Lab 06/18/20 1823  AST 25  ALT 31  ALKPHOS 54  BILITOT 1.1  PROT 7.5  ALBUMIN 3.7   No results for input(s): LIPASE, AMYLASE in the last 168 hours. Recent Labs  Lab 06/18/20 1823  AMMONIA 23   Coagulation Profile: No results for input(s): INR, PROTIME in the last 168 hours. Cardiac Enzymes: No results for input(s): CKTOTAL, CKMB, CKMBINDEX, TROPONINI in the last 168 hours. BNP (last 3 results) No results for input(s): PROBNP in the last 8760 hours. HbA1C: No results for input(s): HGBA1C in the last 72 hours. CBG: Recent Labs  Lab 06/18/20 1615 06/19/20 1709  GLUCAP 148* 97   Lipid Profile: No results for input(s): CHOL, HDL, LDLCALC,  TRIG, CHOLHDL, LDLDIRECT in the last 72 hours. Thyroid Function Tests: No results for input(s): TSH, T4TOTAL, FREET4, T3FREE, THYROIDAB in the last 72 hours. Anemia Panel: No results for input(s): VITAMINB12, FOLATE, FERRITIN, TIBC, IRON, RETICCTPCT in the last 72 hours. Urine analysis:    Component Value Date/Time   COLORURINE YELLOW 06/18/2020 1959   APPEARANCEUR CLOUDY (A) 06/18/2020 1959   LABSPEC 1.014 06/18/2020 1959   PHURINE 5.0 06/18/2020 1959   GLUCOSEU NEGATIVE 06/18/2020 1959   HGBUR NEGATIVE 06/18/2020 1959   BILIRUBINUR NEGATIVE 06/18/2020 1959   KETONESUR NEGATIVE 06/18/2020 1959   PROTEINUR NEGATIVE 06/18/2020 1959   NITRITE NEGATIVE 06/18/2020 1959   LEUKOCYTESUR NEGATIVE 06/18/2020 1959   Sepsis Labs: @LABRCNTIP (procalcitonin:4,lacticidven:4)  )No results found for this or any previous visit (from the past 240 hour(s)).    Radiology Studies: CT HEAD WO CONTRAST  Result Date: 06/19/2020 CLINICAL DATA:  History of right thalamic hemorrhage EXAM: CT HEAD WITHOUT CONTRAST TECHNIQUE: Contiguous axial images were obtained from the base of the skull through the vertex without intravenous contrast. COMPARISON:  06/18/2020 FINDINGS: Brain: The known right thalamic hemorrhage seen on prior study is grossly unchanged on this exam. There is mild increased surrounding edema, with persistent mass effect upon the third ventricle. Stable mild ventriculomegaly with minimal blood products layering dependently within the occipital horns of the lateral ventricles. No frank hydrocephalus at this time. No acute extra-axial fluid collections. Vascular: No hyperdense vessel or unexpected calcification. Skull: Normal. Negative for fracture or focal lesion. Sinuses/Orbits: No acute finding. Other: None. IMPRESSION: 1. Stable size of the right thalamic hemorrhage, with minimal increase in surrounding edema. 2. Stable mass effect upon the third ventricle, with stable mild dilation of the lateral  ventricles without frank hydrocephalus. 3. Stable decompression of the thalamic hemorrhage into the ventricular system, with minimal blood products layering dependently within the occipital horns as before. Electronically Signed   By: 06/21/2020 M.D.   On: 06/19/2020 21:43     Scheduled Meds: .  stroke: mapping our early stages of recovery book   Does not apply Once  . amLODipine  10 mg Oral Daily  . Chlorhexidine Gluconate Cloth  6 each Topical Daily  . cloNIDine  0.1 mg Oral BID  . enoxaparin (LOVENOX) injection  40 mg Subcutaneous Q24H  .  folic acid  1 mg Oral Daily  . labetalol  100 mg Oral BID  . losartan  50 mg Oral BID  . mouth rinse  15 mL Mouth Rinse BID  . multivitamin with minerals  1 tablet Oral Daily  . pantoprazole  40 mg Oral Daily  . QUEtiapine  25 mg Oral QHS  . senna-docusate  1 tablet Oral BID  . thiamine injection  100 mg Intravenous Daily   Or  . thiamine  100 mg Oral Daily   Continuous Infusions: . levETIRAcetam 500 mg (06/21/20 0659)     LOS: 12 days   Time Spent in minutes   45 minutes  Alejandra Barna D.O. on 06/21/2020 at 9:59 AM  Between 7am to 7pm - Please see pager noted on [email or web address]  After 7pm go to [email or web address]  And look for the night coverage person covering for me after hours  [facility] Office  [phone]

## 2020-06-21 NOTE — Plan of Care (Signed)
  Problem: Education: Goal: Knowledge of disease or condition will improve Outcome: Progressing Goal: Knowledge of secondary prevention will improve Outcome: Progressing Goal: Knowledge of patient specific risk factors addressed and post discharge goals established will improve Outcome: Progressing Goal: Individualized Educational Video(s) Outcome: Progressing   Problem: Coping: Goal: Will verbalize positive feelings about self Outcome: Progressing Goal: Will identify appropriate support needs Outcome: Progressing   Problem: Health Behavior/Discharge Planning: Goal: Ability to manage health-related needs will improve Outcome: Progressing   Problem: Self-Care: Goal: Ability to participate in self-care as condition permits will improve Outcome: Progressing Goal: Verbalization of feelings and concerns over difficulty with self-care will improve Outcome: Progressing Goal: Ability to communicate needs accurately will improve Outcome: Progressing   Problem: Nutrition: Goal: Risk of aspiration will decrease Outcome: Progressing Goal: Dietary intake will improve Outcome: Progressing   Problem: Intracerebral Hemorrhage Tissue Perfusion: Goal: Complications of Intracerebral Hemorrhage will be minimized Outcome: Progressing   

## 2020-06-22 ENCOUNTER — Inpatient Hospital Stay (HOSPITAL_COMMUNITY)
Admission: RE | Admit: 2020-06-22 | Discharge: 2020-07-21 | DRG: 057 | Disposition: A | Discharge status: Home Health | Payer: 59 | Source: Intra-hospital | Attending: Physical Medicine & Rehabilitation | Admitting: Physical Medicine & Rehabilitation

## 2020-06-22 ENCOUNTER — Encounter (HOSPITAL_COMMUNITY): Payer: Self-pay | Admitting: Physical Medicine & Rehabilitation

## 2020-06-22 ENCOUNTER — Other Ambulatory Visit: Payer: Self-pay

## 2020-06-22 DIAGNOSIS — I610 Nontraumatic intracerebral hemorrhage in hemisphere, subcortical: Secondary | ICD-10-CM | POA: Diagnosis present

## 2020-06-22 DIAGNOSIS — E669 Obesity, unspecified: Secondary | ICD-10-CM | POA: Diagnosis present

## 2020-06-22 DIAGNOSIS — X58XXXA Exposure to other specified factors, initial encounter: Secondary | ICD-10-CM | POA: Diagnosis present

## 2020-06-22 DIAGNOSIS — M160 Bilateral primary osteoarthritis of hip: Secondary | ICD-10-CM | POA: Diagnosis present

## 2020-06-22 DIAGNOSIS — E785 Hyperlipidemia, unspecified: Secondary | ICD-10-CM | POA: Diagnosis present

## 2020-06-22 DIAGNOSIS — G40209 Localization-related (focal) (partial) symptomatic epilepsy and epileptic syndromes with complex partial seizures, not intractable, without status epilepticus: Secondary | ICD-10-CM

## 2020-06-22 DIAGNOSIS — I69154 Hemiplegia and hemiparesis following nontraumatic intracerebral hemorrhage affecting left non-dominant side: Secondary | ICD-10-CM | POA: Diagnosis not present

## 2020-06-22 DIAGNOSIS — R569 Unspecified convulsions: Secondary | ICD-10-CM

## 2020-06-22 DIAGNOSIS — N179 Acute kidney failure, unspecified: Secondary | ICD-10-CM

## 2020-06-22 DIAGNOSIS — R1032 Left lower quadrant pain: Secondary | ICD-10-CM | POA: Diagnosis present

## 2020-06-22 DIAGNOSIS — G441 Vascular headache, not elsewhere classified: Secondary | ICD-10-CM

## 2020-06-22 DIAGNOSIS — D62 Acute posthemorrhagic anemia: Secondary | ICD-10-CM

## 2020-06-22 DIAGNOSIS — Z6833 Body mass index (BMI) 33.0-33.9, adult: Secondary | ICD-10-CM | POA: Diagnosis not present

## 2020-06-22 DIAGNOSIS — S93401A Sprain of unspecified ligament of right ankle, initial encounter: Secondary | ICD-10-CM | POA: Diagnosis present

## 2020-06-22 DIAGNOSIS — I69319 Unspecified symptoms and signs involving cognitive functions following cerebral infarction: Secondary | ICD-10-CM | POA: Diagnosis not present

## 2020-06-22 DIAGNOSIS — M25512 Pain in left shoulder: Secondary | ICD-10-CM | POA: Diagnosis present

## 2020-06-22 DIAGNOSIS — R52 Pain, unspecified: Secondary | ICD-10-CM

## 2020-06-22 DIAGNOSIS — I5042 Chronic combined systolic (congestive) and diastolic (congestive) heart failure: Secondary | ICD-10-CM | POA: Diagnosis present

## 2020-06-22 DIAGNOSIS — I69398 Other sequelae of cerebral infarction: Secondary | ICD-10-CM

## 2020-06-22 DIAGNOSIS — M545 Low back pain, unspecified: Secondary | ICD-10-CM | POA: Diagnosis not present

## 2020-06-22 DIAGNOSIS — L853 Xerosis cutis: Secondary | ICD-10-CM | POA: Diagnosis present

## 2020-06-22 DIAGNOSIS — Z8249 Family history of ischemic heart disease and other diseases of the circulatory system: Secondary | ICD-10-CM | POA: Diagnosis not present

## 2020-06-22 DIAGNOSIS — I43 Cardiomyopathy in diseases classified elsewhere: Secondary | ICD-10-CM | POA: Diagnosis present

## 2020-06-22 DIAGNOSIS — M25571 Pain in right ankle and joints of right foot: Secondary | ICD-10-CM

## 2020-06-22 DIAGNOSIS — I69318 Other symptoms and signs involving cognitive functions following cerebral infarction: Secondary | ICD-10-CM

## 2020-06-22 DIAGNOSIS — G629 Polyneuropathy, unspecified: Secondary | ICD-10-CM | POA: Diagnosis present

## 2020-06-22 DIAGNOSIS — I69354 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-dominant side: Secondary | ICD-10-CM | POA: Diagnosis present

## 2020-06-22 DIAGNOSIS — M7918 Myalgia, other site: Secondary | ICD-10-CM | POA: Diagnosis present

## 2020-06-22 DIAGNOSIS — G8194 Hemiplegia, unspecified affecting left nondominant side: Secondary | ICD-10-CM

## 2020-06-22 DIAGNOSIS — I69391 Dysphagia following cerebral infarction: Secondary | ICD-10-CM

## 2020-06-22 DIAGNOSIS — I10 Essential (primary) hypertension: Secondary | ICD-10-CM

## 2020-06-22 DIAGNOSIS — Z515 Encounter for palliative care: Secondary | ICD-10-CM | POA: Diagnosis not present

## 2020-06-22 DIAGNOSIS — Z7189 Other specified counseling: Secondary | ICD-10-CM | POA: Diagnosis not present

## 2020-06-22 DIAGNOSIS — I110 Hypertensive heart disease with heart failure: Secondary | ICD-10-CM | POA: Diagnosis present

## 2020-06-22 DIAGNOSIS — Z79899 Other long term (current) drug therapy: Secondary | ICD-10-CM | POA: Diagnosis not present

## 2020-06-22 DIAGNOSIS — I69320 Aphasia following cerebral infarction: Secondary | ICD-10-CM

## 2020-06-22 DIAGNOSIS — F1010 Alcohol abuse, uncomplicated: Secondary | ICD-10-CM

## 2020-06-22 LAB — BASIC METABOLIC PANEL
Anion gap: 9 (ref 5–15)
BUN: 18 mg/dL (ref 6–20)
CO2: 27 mmol/L (ref 22–32)
Calcium: 9.4 mg/dL (ref 8.9–10.3)
Chloride: 100 mmol/L (ref 98–111)
Creatinine, Ser: 1.32 mg/dL — ABNORMAL HIGH (ref 0.61–1.24)
GFR, Estimated: >60 mL/min (ref >60)
Glucose, Bld: 103 mg/dL — ABNORMAL HIGH (ref 70–99)
Potassium: 3.6 mmol/L (ref 3.5–5.1)
Sodium: 136 mmol/L (ref 135–145)

## 2020-06-22 LAB — CBC
HCT: 36.9 % — ABNORMAL LOW (ref 39.0–52.0)
Hemoglobin: 12.2 g/dL — ABNORMAL LOW (ref 13.0–17.0)
MCH: 31 pg (ref 26.0–34.0)
MCHC: 33.1 g/dL (ref 30.0–36.0)
MCV: 93.9 fL (ref 80.0–100.0)
Platelets: 509 10*3/uL — ABNORMAL HIGH (ref 150–400)
RBC: 3.93 MIL/uL — ABNORMAL LOW (ref 4.22–5.81)
RDW: 11.6 % (ref 11.5–15.5)
WBC: 6.8 10*3/uL (ref 4.0–10.5)
nRBC: 0 % (ref 0.0–0.2)

## 2020-06-22 MED ORDER — CLONIDINE HCL 0.1 MG PO TABS
0.1000 mg | ORAL_TABLET | Freq: Three times a day (TID) | ORAL | Status: DC
  Administered 2020-06-22 – 2020-06-24 (×7): 0.1 mg via ORAL
  Filled 2020-06-22 (×8): qty 1

## 2020-06-22 MED ORDER — POLYVINYL ALCOHOL 1.4 % OP SOLN: 1.0000 [drp] | Freq: Four times a day (QID) | OPHTHALMIC | Status: DC | PRN

## 2020-06-22 MED ORDER — FOLIC ACID 1 MG PO TABS
1.0000 mg | ORAL_TABLET | Freq: Every day | ORAL | Status: DC
  Administered 2020-06-23 – 2020-07-21 (×29): 1 mg via ORAL
  Filled 2020-06-22 (×29): qty 1

## 2020-06-22 MED ORDER — THIAMINE HCL 100 MG/ML IJ SOLN
100.0000 mg | Freq: Every day | INTRAMUSCULAR | Status: DC
  Filled 2020-06-22 (×29): qty 1

## 2020-06-22 MED ORDER — AMLODIPINE BESYLATE 10 MG PO TABS
10.0000 mg | ORAL_TABLET | Freq: Every day | ORAL | Status: DC
  Administered 2020-06-23 – 2020-07-21 (×29): 10 mg via ORAL
  Filled 2020-06-22 (×29): qty 1

## 2020-06-22 MED ORDER — FOLIC ACID 1 MG PO TABS
1.0000 mg | ORAL_TABLET | Freq: Every day | ORAL | Status: DC
Start: 2020-06-22 — End: 2020-07-20

## 2020-06-22 MED ORDER — LOSARTAN POTASSIUM 50 MG PO TABS
50.0000 mg | ORAL_TABLET | Freq: Two times a day (BID) | ORAL | Status: DC
  Administered 2020-06-22 – 2020-06-24 (×4): 50 mg via ORAL
  Filled 2020-06-22 (×5): qty 1

## 2020-06-22 MED ORDER — TRAZODONE HCL 50 MG PO TABS
50.0000 mg | ORAL_TABLET | Freq: Every evening | ORAL | Status: DC | PRN
  Administered 2020-06-28 – 2020-07-10 (×4): 50 mg via ORAL
  Filled 2020-06-22 (×9): qty 1

## 2020-06-22 MED ORDER — ADULT MULTIVITAMIN W/MINERALS CH
1.0000 | ORAL_TABLET | Freq: Every day | ORAL | Status: DC
  Administered 2020-06-23 – 2020-07-21 (×29): 1 via ORAL
  Filled 2020-06-22 (×29): qty 1

## 2020-06-22 MED ORDER — CLONIDINE HCL 0.1 MG PO TABS: 0.1000 mg | ORAL_TABLET | Freq: Three times a day (TID) | ORAL | Status: DC

## 2020-06-22 MED ORDER — EXERCISE FOR HEART AND HEALTH BOOK
Freq: Once | Status: AC
  Filled 2020-06-22: qty 1

## 2020-06-22 MED ORDER — FLEET ENEMA 7-19 GM/118ML RE ENEM: 1.0000 | ENEMA | Freq: Every day | RECTAL | Status: DC | PRN

## 2020-06-22 MED ORDER — ALUM & MAG HYDROXIDE-SIMETH 200-200-20 MG/5ML PO SUSP: 30.0000 mL | ORAL | Status: DC | PRN

## 2020-06-22 MED ORDER — LEVETIRACETAM 500 MG PO TABS: 500.0000 mg | ORAL_TABLET | Freq: Two times a day (BID) | ORAL | Status: DC

## 2020-06-22 MED ORDER — ACETAMINOPHEN 325 MG PO TABS
325.0000 mg | ORAL_TABLET | ORAL | Status: DC | PRN
  Administered 2020-06-22 – 2020-07-13 (×43): 650 mg via ORAL
  Administered 2020-07-15: 325 mg via ORAL
  Administered 2020-07-16 – 2020-07-20 (×6): 650 mg via ORAL
  Filled 2020-06-22 (×19): qty 2
  Filled 2020-06-22: qty 1
  Filled 2020-06-22 (×33): qty 2

## 2020-06-22 MED ORDER — LIDOCAINE HCL URETHRAL/MUCOSAL 2 % EX GEL: CUTANEOUS | Status: DC | PRN

## 2020-06-22 MED ORDER — ENOXAPARIN SODIUM 40 MG/0.4ML IJ SOSY
40.0000 mg | PREFILLED_SYRINGE | INTRAMUSCULAR | Status: DC
  Administered 2020-06-23 – 2020-07-21 (×29): 40 mg via SUBCUTANEOUS
  Filled 2020-06-22 (×29): qty 0.4

## 2020-06-22 MED ORDER — THIAMINE HCL 100 MG PO TABS: 100.0000 mg | ORAL_TABLET | Freq: Every day | ORAL | Status: DC

## 2020-06-22 MED ORDER — THIAMINE HCL 100 MG PO TABS
100.0000 mg | ORAL_TABLET | Freq: Every day | ORAL | Status: DC
  Administered 2020-06-23 – 2020-07-21 (×29): 100 mg via ORAL
  Filled 2020-06-22 (×29): qty 1

## 2020-06-22 MED ORDER — BLOOD PRESSURE CONTROL BOOK
Freq: Once | Status: AC
  Filled 2020-06-22: qty 1

## 2020-06-22 MED ORDER — AMLODIPINE BESYLATE 10 MG PO TABS: 10.0000 mg | ORAL_TABLET | Freq: Every day | ORAL | Status: DC

## 2020-06-22 MED ORDER — QUETIAPINE FUMARATE 25 MG PO TABS: 25.0000 mg | ORAL_TABLET | Freq: Every day | ORAL | Status: DC

## 2020-06-22 MED ORDER — POLYVINYL ALCOHOL 1.4 % OP SOLN: 1.0000 [drp] | Freq: Four times a day (QID) | OPHTHALMIC | 0 refills | Status: DC | PRN

## 2020-06-22 MED ORDER — LABETALOL HCL 100 MG PO TABS: 100.0000 mg | ORAL_TABLET | Freq: Two times a day (BID) | ORAL | Status: DC

## 2020-06-22 MED ORDER — LABETALOL HCL 100 MG PO TABS
100.0000 mg | ORAL_TABLET | Freq: Two times a day (BID) | ORAL | Status: DC
  Administered 2020-06-22 – 2020-07-21 (×58): 100 mg via ORAL
  Filled 2020-06-22 (×59): qty 1

## 2020-06-22 MED ORDER — LOSARTAN POTASSIUM 50 MG PO TABS: 50.0000 mg | ORAL_TABLET | Freq: Two times a day (BID) | ORAL | Status: DC

## 2020-06-22 MED ORDER — SENNOSIDES-DOCUSATE SODIUM 8.6-50 MG PO TABS
2.0000 | ORAL_TABLET | Freq: Every day | ORAL | Status: DC
  Administered 2020-06-23 – 2020-07-20 (×27): 2 via ORAL
  Filled 2020-06-22 (×27): qty 2

## 2020-06-22 MED ORDER — QUETIAPINE FUMARATE 25 MG PO TABS
25.0000 mg | ORAL_TABLET | Freq: Every day | ORAL | Status: DC
  Administered 2020-06-22 – 2020-06-23 (×2): 25 mg via ORAL
  Filled 2020-06-22 (×3): qty 1

## 2020-06-22 MED ORDER — LEVETIRACETAM 500 MG PO TABS
500.0000 mg | ORAL_TABLET | Freq: Two times a day (BID) | ORAL | Status: DC
  Administered 2020-06-22 – 2020-07-21 (×58): 500 mg via ORAL
  Filled 2020-06-22 (×59): qty 1

## 2020-06-22 MED ORDER — PANTOPRAZOLE SODIUM 40 MG PO TBEC: 40.0000 mg | DELAYED_RELEASE_TABLET | Freq: Every day | ORAL | Status: DC

## 2020-06-22 MED ORDER — PANTOPRAZOLE SODIUM 40 MG PO TBEC
40.0000 mg | DELAYED_RELEASE_TABLET | Freq: Every day | ORAL | Status: DC
  Administered 2020-06-23 – 2020-07-21 (×29): 40 mg via ORAL
  Filled 2020-06-22 (×29): qty 1

## 2020-06-22 MED ORDER — ENOXAPARIN SODIUM 40 MG/0.4ML IJ SOSY: 40.0000 mg | PREFILLED_SYRINGE | INTRAMUSCULAR | Status: DC

## 2020-06-22 NOTE — Progress Notes (Signed)
Occupational Therapy Treatment Patient Details Name: [name] MRN: [id number] DOB: 05-05-1965 Today's Date: 06/22/2020    History of present illness 55 y.o. male who presented 5/18 with L sided weakness, numbness, and right gaze preference. No tPA administered due to ICH. 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 5/27 pt with AMS with encephalopathy with repeat EEG and CT and ICU transfer. PMH: HTN, obesity, cardiomyopathy, hyperlipidemia, and CHF.   OT comments  Pt progressing towards established OT goals and is very motivated to participate in therapy. Pt performing sit<>stand with Min A +2 and L knee blocked; use of back of recliner for UE support. Facilitating weight bearing through L elbow. Pt performing posterior peri care with Mod A for standing balance due to L lateral lean. Pt continues to present with decreased balance, strength, and cognition impacting his performance of ADLs. Continue to recommend dc to CIR and will continue to follow acutely as admitted.   Follow Up Recommendations  CIR    Equipment Recommendations  3 in 1 bedside commode;Wheelchair (measurements OT);Wheelchair cushion (measurements OT);Hospital bed    Recommendations for Other Services Rehab consult    Precautions / Restrictions Precautions Precautions: Fall;Other (comment) Precaution Comments: Lt hemi with right gaze preference       Mobility Bed Mobility Overal bed mobility: Needs Assistance Bed Mobility: Sit to Sidelying;Rolling;Supine to Sit Rolling: Min guard   Supine to sit: Mod assist;+2 for physical assistance   Sit to sidelying: Max assist;+2 for physical assistance General bed mobility comments: Mod A to faciltiate BLEs over EOB and then elevate trunk. Max A +2 for reutnr ot sidelying and then supine    Transfers Overall transfer level: Needs assistance Equipment used: Ambulation equipment used Transfers: Sit to/from  Stand Sit to Stand: Min assist;+2 physical assistance         General transfer comment: L knee blocked. Min A +2 for power up into standing    Balance Overall balance assessment: Needs assistance Sitting-balance support: Feet supported;Single extremity supported Sitting balance-Leahy Scale: Poor Sitting balance - Comments: Slight posterior lean and left lateral lean at EOB. increased control as session progressed Postural control: Left lateral lean;Posterior lean Standing balance support: Bilateral upper extremity supported;During functional activity Standing balance-Leahy Scale: Poor Standing balance comment: left lateral bias                           ADL either performed or assessed with clinical judgement   ADL Overall ADL's : Needs assistance/impaired Eating/Feeding: Supervision/ safety;Cueing for sequencing;Bed level Eating/Feeding Details (indicate cue type and reason): Upright posture in bed. Pt with decreased attention to L side of plate and requiring cues to locate food on that side. Education on anchor technique (using knife on left side of plate) for visual deficits. Pt verablized understanding. Will need further practice.                     Toilet Transfer: +2 for physical assistance;Ambulation;Minimal assistance;Maximal assistance (simulated) Toilet Transfer Details (indicate cue type and reason): Max A +2 for taking a few steps forward and then to the side. Min A +2 for power up into standing Toileting- Clothing Manipulation and Hygiene: Moderate assistance;Sit to/from stand Toileting - Clothing Manipulation Details (indicate cue type and reason): Mod A for left lateral lean and Max cues for posterial controle     Functional mobility during ADLs: Maximal assistance;+2  for physical assistance (use of back of recliner for UE support) General ADL Comments: Pt performing sit<>stand and then forward and side steps. Completing peri care in standing with  Mod A for stanidng balance. Performing self feeding in bed     Vision   Vision Assessment?: Yes Eye Alignment: Impaired (comment) (dysconjugate gaze) Ocular Range of Motion: Within Functional Limits Alignment/Gaze Preference: Within Defined Limits Tracking/Visual Pursuits: Decreased smoothness of horizontal tracking;Other (comment) (difficulty performing horizontal tracking) Saccades: Additional head turns occurred during testing;Additional eye shifts occurred during testing;Decreased speed of saccadic movement;Impaired - to be further tested in functional context Convergence: Within functional limits Visual Fields: Impaired-to be further tested in functional context;Left visual field deficit (Pt not attending to objects in L field adn requires VC to locate items) Diplopia Assessment:  (denies diplopia) Depth Perception: Overshoots   Perception     Praxis      Cognition Arousal/Alertness: Awake/alert Behavior During Therapy: WFL for tasks assessed/performed Overall Cognitive Status: Impaired/Different from baseline Area of Impairment: Attention;Following commands;Safety/judgement;Awareness;Problem solving;Memory                   Current Attention Level: Sustained Memory: Decreased short-term memory Following Commands: Follows one step commands with increased time;Follows multi-step commands inconsistently Safety/Judgement: Decreased awareness of safety;Decreased awareness of deficits Awareness: Intellectual;Emergent Problem Solving: Slow processing;Requires tactile cues;Requires verbal cues General Comments: Upon arrival, pt stating "I dont feel well" and then described feeling down and with a HA. Pt continues to present with decreased sequencing, attention, following commands, and problem solving. Requiring increased time and cues. Highly motivated.        Exercises     Shoulder Instructions       General Comments      Pertinent Vitals/ Pain       Pain  Assessment: Faces Faces Pain Scale: Hurts little more Pain Location: head Pain Descriptors / Indicators: Headache Pain Intervention(s): Monitored during session;Repositioned  Home Living                                          Prior Functioning/Environment              Frequency  Min 2X/week        Progress Toward Goals  OT Goals(current goals can now be found in the care plan section)  Progress towards OT goals: Progressing toward goals  Acute Rehab OT Goals Patient Stated Goal: to get rehab and get better OT Goal Formulation: With patient/family (spoke with wife over the phone) Time For Goal Achievement: 06/26/20 Potential to Achieve Goals: Good ADL Goals Pt Will Perform Grooming: with set-up;sitting Pt Will Transfer to Toilet: with mod assist;squat pivot transfer;bedside commode Pt/caregiver will Perform Home Exercise Program: Increased strength;Left upper extremity;With written HEP provided;With minimal assist Additional ADL Goal #1: Pt will perform higher level cognitive task with <3 errors in 3/5 trials. Additional ADL Goal #2: Pt will perform x10 mins of OOB ADL tasks with minimal cues for attending to task.  Plan Discharge plan remains appropriate    Co-evaluation    PT/OT/SLP Co-Evaluation/Treatment: Yes Reason for Co-Treatment: For patient/therapist safety;To address functional/ADL transfers   OT goals addressed during session: ADL's and self-care      AM-PAC OT "6 Clicks" Daily Activity     Outcome Measure   Help from another person eating meals?: A Little Help from another person taking care of  personal grooming?: A Little Help from another person toileting, which includes using toliet, bedpan, or urinal?: Total Help from another person bathing (including washing, rinsing, drying)?: A Lot Help from another person to put on and taking off regular upper body clothing?: A Lot Help from another person to put on and taking off  regular lower body clothing?: Total 6 Click Score: 12    End of Session Equipment Utilized During Treatment: Gait belt  OT Visit Diagnosis: Unsteadiness on feet (R26.81);Muscle weakness (generalized) (M62.81);Pain;Other symptoms and signs involving cognitive function;Other symptoms and signs involving the nervous system (R29.898);Hemiplegia and hemiparesis Hemiplegia - Right/Left: Left Hemiplegia - dominant/non-dominant: Non-Dominant Hemiplegia - caused by: Nontraumatic intracerebral hemorrhage Pain - part of body:  (head)   Activity Tolerance Patient tolerated treatment well   Patient Left with call bell/phone within reach;with bed alarm set;in bed   Nurse Communication Mobility status        Time: 0086-7619 OT Time Calculation (min): 29 min  Charges: OT General Charges $OT Visit: 1 Visit OT Treatments $Self Care/Home Management : 8-22 mins  Taraoluwa Thakur MSOT, OTR/L Acute Rehab Pager: [phone] Office: [phone]   Theodoro Grist Lurena Naeve 06/22/2020, 2:04 PM

## 2020-06-22 NOTE — Progress Notes (Signed)
Erick Colace, MD  Physician  Physical Medicine and Rehabilitation  Consult Note      Addendum  Date of Service:  06/14/2020  5:05 AM      Related encounter: ED to Hosp-Admission (Current) from 06/09/2020 in [facility]       Expand All Collapse All     Show:Clear all [x] Manual[x] Template[] Copied  Added by: [x] Angiulli, , PA-C[x] Kirsteins, , MD   [] Hover for details           Physical Medicine and Rehabilitation Consult Reason for Consult: Left side weakness Referring Physician: Dr. Mcarthur Rossetti     HPI: [name] is a 55 y.o. right-handed male with past medical history of hypertension, obesity with BMI 36.03, cardiomyopathy, hyperlipidemia, diastolic congestive heart failure..  Per chart review lives with spouse.  1 level home with 3 steps to entry.  Independent prior to admission working as a [employer].  Presented 06/09/2020 with acute onset of left-sided weakness and right gaze.  Noted BP systolic in the 200s-230s.  Cranial CT scan showed acute right thalamic hemorrhage with intraventricular extension 4 mm leftward midline shift.  Echocardiogram with ejection fraction of 65 to 70% no wall motion abnormalities.  Admission chemistries unremarkable except glucose 133, TSH 0.483, hemoglobin A1c 5.5, urine drug screen negative.  Follow-up MRI showed no substantial change in size extent of acute thalamic hemorrhage with intraventricular extension midline shift unchanged.  No progressive ventriculomegaly.  MRA with no evidence of vascular malformation.  No large vessel occlusion.  Neurology follow-up conservative care monitoring of blood pressure initially maintained on Cleviprex and also initially maintained as well as hypertonic saline.59  He was cleared to begin Lovenox for DVT prophylaxis 06/12/2020.  Dysphagia #2 thin liquid diet.  Due to patient's left-sided weakness decreased functional mobility recommendations of physical medicine  rehab consult. Patient is awake but drifts off to sleep.       Review of Systems  Constitutional: Negative for chills and fever.  HENT: Negative for hearing loss.   Eyes: Negative for blurred vision and double vision.  Respiratory: Negative for cough.        Occasional shortness of breath with heavy exertion.  Cardiovascular: Positive for leg swelling. Negative for chest pain and palpitations.  Gastrointestinal: Positive for constipation. Negative for heartburn, nausea and vomiting.  Genitourinary: Negative for dysuria, flank pain and hematuria.  Skin: Negative for rash.  Neurological: Positive for weakness.  All other systems reviewed and are negative.       Past Medical History:  Diagnosis Date  . Chronic combined systolic and diastolic CHF (congestive heart failure) (HCC)      EF 25-30%  . Diastolic dysfunction    . Dyslipidemia    . Hyperlipidemia    . Hypertension    . Nonischemic dilated cardiomyopathy (HCC)      felt secondary to HTN with no ischemia on nuclear stress test 05/2012, EF 25-30%  . Shortness of breath           Past Surgical History:  Procedure Laterality Date  . KNEE ARTHROSCOPY Left 04/13/2016    Procedure: ARTHROSCOPY KNEE WITH DEBRIDEMENT;  Surgeon: 06/14/2020, MD;  Location: [facility];  Service: Orthopedics;  Laterality: Left;  . KNEE ARTHROSCOPY WITH MEDIAL MENISECTOMY Right 04/13/2016    Procedure: KNEE ARTHROSCOPY WITH MEDIAL MENISECTOMY;  Surgeon: 04/15/2016, MD;  Location: [facility];  Service: Orthopedics;  Laterality: Right;  . NASAL FRACTURE SURGERY  Family History  Problem Relation Age of Onset  . Hypertension Mother          Passed away when she is 67 secondary to hip surgery complications  . CAD Mother    . Heart attack Mother    . Hypertension Father    . Heart attack Maternal Grandmother      Social History:  reports that he has never smoked. He has never used smokeless  tobacco. He reports current alcohol use of about 24.0 standard drinks of alcohol per week. He reports that he does not use drugs. Allergies: No Known Allergies       Medications Prior to Admission  Medication Sig Dispense Refill  . atorvastatin (LIPITOR) 40 MG tablet Take 1 tablet (40 mg total) by mouth daily at 6 PM. 90 tablet 1  . hydrochlorothiazide (HYDRODIURIL) 12.5 MG tablet Take 1 tablet (12.5 mg total) by mouth every other day. 90 tablet 1  . ibuprofen (ADVIL) 200 MG tablet Take 800 mg by mouth every 6 (six) hours as needed for moderate pain.      . metoprolol tartrate (LOPRESSOR) 100 MG tablet Take 1 tablet (100 mg total) by mouth 2 (two) times daily. 180 tablet 1  . Multiple Vitamin (MULTIVITAMIN WITH MINERALS) TABS tablet Take 1 tablet by mouth daily.      Marland Kitchen losartan (COZAAR) 50 MG tablet Take 1 tablet (50 mg total) by mouth daily. 90 tablet 1  . predniSONE (DELTASONE) 10 MG tablet 6 tabs po day 1, 5 tabs po day 2, 4 tabs po day 3, 3 tabs po day 4, 2 tabs po day 5, 1 tab po day 6 (Patient not taking: No sig reported) 21 tablet 0      Home: Home Living Family/patient expects to be discharged to:: Private residence Living Arrangements: Spouse/significant other Available Help at Discharge: Family,Available 24 hours/day (wife works but daughter can assist also) Type of Home: House Home Access: Stairs to enter Secretary/administrator of Steps: 3 Entrance Stairs-Rails: Left (ascending) Home Layout: One level Bathroom Shower/Tub: Engineer, manufacturing systems: Standard Home Equipment: None  Lives With: Spouse  Functional History: Prior Function Level of Independence: Independent Comments: Pt drives a forklift. Functional Status:  Mobility: Bed Mobility Overal bed mobility: Needs Assistance Bed Mobility: Supine to Sit,Sit to Supine Supine to sit: Max assist,+2 for physical assistance,+2 for safety/equipment,HOB elevated Sit to supine: Total assist,+2 for physical  assistance,+2 for safety/equipment General bed mobility comments: Pt very lethargic; started to get very lethargic after 5 mins at EOB Transfers Overall transfer level: Needs assistance Equipment used: 2 person hand held assist Transfers: Sit to/from Stand Sit to Stand: Max assist,+2 physical assistance General transfer comment: no +2 available, DNT Ambulation/Gait General Gait Details: Unable   ADL: ADL Overall ADL's : Needs assistance/impaired Eating/Feeding: NPO Grooming: Minimal assistance Upper Body Bathing: Moderate assistance Lower Body Bathing: Total assistance Upper Body Dressing : Moderate assistance Lower Body Dressing: Total assistance Toilet Transfer: Total assistance,+2 for physical assistance,+2 for safety/equipment Toilet Transfer Details (indicate cue type and reason): unable to simulate without +2 Toileting- Clothing Manipulation and Hygiene: Total assistance,+2 for physical assistance,+2 for safety/equipment,Bed level Functional mobility during ADLs: Maximal assistance,+2 for physical assistance,+2 for safety/equipment,Cueing for safety General ADL Comments: Pt limited by decreased strength, poor ability to care for self, decreased cognition and decreased endurance.   Cognition: Cognition Overall Cognitive Status: Impaired/Different from baseline Arousal/Alertness: Awake/alert Orientation Level: Oriented X4 Attention: Sustained Sustained Attention: Impaired Sustained Attention Impairment: Verbal basic Memory:  Impaired Memory Impairment: Retrieval deficit Awareness: Impaired Awareness Impairment: Emergent impairment,Anticipatory impairment Problem Solving: Impaired Problem Solving Impairment: Verbal basic Executive Function: Sequencing,Organizing Behaviors: Impulsive Safety/Judgment: Impaired Cognition Arousal/Alertness: Lethargic Behavior During Therapy: Impulsive Overall Cognitive Status: Impaired/Different from baseline Area of Impairment:  Attention,Memory,Following commands,Safety/judgement,Awareness,Problem solving Current Attention Level: Alternating Memory: Decreased short-term memory Following Commands: Follows one step commands inconsistently,Follows one step commands with increased time Safety/Judgement: Decreased awareness of safety,Decreased awareness of deficits Awareness: Intellectual Problem Solving: Slow processing,Decreased initiation,Difficulty sequencing,Requires verbal cues,Requires tactile cues General Comments: Pt continues to be lethargic, following commands with increased effort and requiring cues for safety awareness and deficits. pt A/O x4.   Blood pressure 139/84, pulse 71, temperature 97.6 F (36.4 C), temperature source Oral, resp. rate (!) 23, height 5\' 10"  (1.778 m), weight 113.9 kg, SpO2 96 %. Physical Exam Vitals and nursing note reviewed.  Constitutional:      Appearance: He is obese.  HENT:     Head: Normocephalic and atraumatic.  Eyes:     Extraocular Movements: Extraocular movements intact.     Conjunctiva/sclera: Conjunctivae normal.     Pupils: Pupils are equal, round, and reactive to light.  Cardiovascular:     Rate and Rhythm: Normal rate and regular rhythm.     Heart sounds: Normal heart sounds. No murmur heard.    Pulmonary:     Effort: Pulmonary effort is normal.     Breath sounds: Normal breath sounds.  Abdominal:     General: Abdomen is flat. Bowel sounds are normal. There is no distension.     Palpations: Abdomen is soft.  Musculoskeletal:     Cervical back: No tenderness.     Right lower leg: Edema present.     Left lower leg: Edema present.     Comments: No pain with upper extremity or lower extremity range of motion  Skin:    General: Skin is warm and dry.  Neurological:     General: No focal deficit present.     Motor: Weakness present. No tremor.     Gait: Gait abnormal.     Comments: Patient is alert in no acute distress.  Makes eye contact with examiner.   Oriented to person and place but not age and time needing some cues.  Follows simple commands.  Motor strength is 5/5 in the right deltoid, bicep, tricep, grip, hip flexor, knee extensor, ankle dorsiflexor and plantar flexor 0/5 in the left deltoid, bicep, tricep, grip, hip flexor, knee extensor, ankle dorsiflexion plantarflexion There is decreased sensation to light touch pinch and proprioception in the left upper limb left lower limb  Psychiatric:        Mood and Affect: Mood normal.        Lab Results Last 24 Hours       Results for orders placed or performed during the hospital encounter of 06/09/20 (from the past 24 hour(s))  Basic metabolic panel     Status: Abnormal    Collection Time: 06/13/20  4:12 AM  Result Value Ref Range    Sodium 134 (L) 135 - 145 mmol/L    Potassium 3.6 3.5 - 5.1 mmol/L    Chloride 102 98 - 111 mmol/L    CO2 26 22 - 32 mmol/L    Glucose, Bld 119 (H) 70 - 99 mg/dL    BUN 10 6 - 20 mg/dL    Creatinine, Ser 1.611.13 0.61 - 1.24 mg/dL    Calcium 8.7 (L) 8.9 - 10.3 mg/dL  GFR, Estimated >60 >60 mL/min    Anion gap 6 5 - 15  CBC     Status: Abnormal    Collection Time: 06/13/20  4:12 AM  Result Value Ref Range    WBC 7.5 4.0 - 10.5 K/uL    RBC 3.59 (L) 4.22 - 5.81 MIL/uL    Hemoglobin 11.2 (L) 13.0 - 17.0 g/dL    HCT 57.9 (L) 72.8 - 52.0 %    MCV 94.4 80.0 - 100.0 fL    MCH 31.2 26.0 - 34.0 pg    MCHC 33.0 30.0 - 36.0 g/dL    RDW 20.6 01.5 - 61.5 %    Platelets 235 150 - 400 K/uL    nRBC 0.0 0.0 - 0.2 %       Imaging Results (Last 48 hours)  CT HEAD WO CONTRAST   Result Date: 06/11/2020 CLINICAL DATA:  Left arm weakness. Intracranial hemorrhage follow-up EXAM: CT HEAD WITHOUT CONTRAST TECHNIQUE: Contiguous axial images were obtained from the base of the skull through the vertex without intravenous contrast. COMPARISON:  Head CT 06/10/2020 FINDINGS: Brain: Unchanged intraparenchymal hemorrhage centered in the right thalamus with intraventricular  extension. Size and configuration of the ventricles is unchanged. Mild leftward midline shift is also unchanged. No new site of hemorrhage. Vascular: No abnormal hyperdensity of the major intracranial arteries or dural venous sinuses. No intracranial atherosclerosis. Skull: The visualized skull base, calvarium and extracranial soft tissues are normal. Sinuses/Orbits: No fluid levels or advanced mucosal thickening of the visualized paranasal sinuses. No mastoid or middle ear effusion. The orbits are normal. IMPRESSION: 1. Unchanged size and configuration of right thalamic intraparenchymal hemorrhage with intraventricular extension. 2. Unchanged mild leftward midline shift. Electronically Signed   By: Deatra Robinson M.D.   On: 06/11/2020 18:49         Assessment/Plan: Diagnosis: Right thalamic hemorrhage 1. Does the need for close, 24 hr/day medical supervision in concert with the patient's rehab needs make it unreasonable for this patient to be served in a less intensive setting? Yes 2. Co-Morbidities requiring supervision/potential complications: Morbid obesity, hypertension, dilated cardiomyopathy 3. Due to bladder management, bowel management, safety, skin/wound care, disease management, medication administration, pain management and patient education, does the patient require 24 hr/day rehab nursing? Yes 4. Does the patient require coordinated care of a physician, rehab nurse, therapy disciplines of PT, OT, speech to address physical and functional deficits in the context of the above medical diagnosis(es)? Yes Addressing deficits in the following areas: balance, endurance, locomotion, strength, transferring, bowel/bladder control, bathing, dressing, feeding, grooming, toileting, cognition and psychosocial support 5. Can the patient actively participate in an intensive therapy program of at least 3 hrs of therapy per day at least 5 days per week? Yes 6. The potential for patient to make measurable  gains while on inpatient rehab is good 7. Anticipated functional outcomes upon discharge from inpatient rehab are mod assist  with PT, mod assist with OT, supervision with SLP. 8. Estimated rehab length of stay to reach the above functional goals is: 21 to 23 days 9. Anticipated discharge destination: Home 10. Overall Rehab/Functional Prognosis: good   RECOMMENDATIONS: This patient's condition is appropriate for continued rehabilitative care in the following setting: CIR Patient has agreed to participate in recommended program. Potentially Note that insurance prior authorization may be required for reimbursement for recommended care.   Comment: Need to confirm 24/7 caregiver post discharge     Charlton Amor, PA-C 06/13/2020    "I have personally  performed a face to face diagnostic evaluation of this patient.  Additionally, I have reviewed and concur with the physician assistant's documentation above." Erick Colace M.D. [facility] Fellow Am Acad of Phys Med and Rehab Diplomate Am Board of Electrodiagnostic Med Fellow Am Board of Interventional Pain          Revision History                             Routing History                   Note Details  Author Erick Colace, MD File Time 06/14/2020  1:09 PM  Author Type Physician Status Addendum  Last Editor Erick Colace, MD Service Physical Medicine and Rehabilitation

## 2020-06-22 NOTE — Progress Notes (Signed)
Standley Brooking, RN  Rehab Admission Coordinator  Physical Medicine and Rehabilitation  PMR Pre-admission      Signed  Date of Service:  06/14/2020  6:37 PM      Related encounter: ED to Hosp-Admission (Current) from 06/09/2020 in [facility]       Signed          Show:Clear all [x] Manual[x] Template[x] Copied  Added by: [x] , , RN[x] Beckie Salts, Lauren Tye [place], CCC-SLP   [] Hover for details  PMR Admission Coordinator Pre-Admission Assessment   Patient: [name] is an 55 y.o., male MRN: DOB: [id number] Height: 5\' 10"  (177.8 cm) Weight: 106.3 kg                                                                                                                                                  Insurance Information   PRIMARY: [employer]      Policy#: [id number]      Subscriber: patient CM Name: [id number]    Phone#: [phone]     Fax#: Pre-Cert#: [id number] approved for 7 days f/u with Alexa need updates on 6/1  Phone [phone]    Employer:  Benefits:  Phone #: [phone]     Name: 5/26 Eff. Date: 05/23/2020     Deduct: $1000      Out of Pocket Max: $4000      Life Max: none  CIR: 80%      SNF: 80% Outpatient: 80%     Co-Pay: 20% Home Health: 80%      Co-Pay: 20% DME: 80%     Co-Pay: 205 Providers: in-network  SECONDARY:   none    Financial Counselor:       Phone#:    The [phone]" for patients in Inpatient Rehabilitation Facilities with attached "Privacy Act Statement-Health Care Records" was provided and verbally reviewed with: N/A   Emergency Contact Information         Contact Information     Name Relation Home Work Mobile    williams,Cheryl Spouse [phone]   [phone]       Current Medical History  Patient Admitting Diagnosis: Right thalamic ICH   History of Present Illness:  [name] is a 55 year old male with past medical history of hypertension, obesity  with BMI 36.03, cardiomyopathy, hyperlipidemia, diastolic congestive heart failure..  Per chart review lives with spouse.  1 level home with 3 steps to entry.  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  Presented 06/09/2020 with acute onset of left-sided weakness and right gaze.  Noted BP systolic in the 200's-230's.  Cranial CT scan showed acute right thalamic hemorrhage with intraventricular extension 4 mm leftward midline shift.  Echocardiogram with ejection fraction of 65 to 70% no wall motion abnormalities.  Admission chemistries unremarkable except glucose 133, TSH  0.483, hemoglobin A1c 5.5, urine drug screen negative.  Follow-up MRI showed no substantial change in size extent of acute thalamic hemorrhage with intraventricular extension midline shift unchanged.  No progressive ventriculomegaly.  MRA with no evidence of vascular malformation.  No large vessel occlusion.  Neurology follow-up conservative care monitoring of blood pressure initially maintained on Cleviprex and also initially maintained as well as hypertonic saline.Marland Kitchen  He was cleared to begin Lovenox for DVT prophylaxis 06/12/2020.  Dysphagia 3/ thin liquid diet.    Episodes of confusion and anxiety treated for alcohol withdrawal with Ativan. ON 5/25 overnight with visual and auditory hallucinations. A repeat CT scan felt stable and improving. Not on scheduled benzodiazepine. Complains headaches which improved with Xanax and Tylenol. Plans to keep low dose Xanax for a few days.Placed on Depakote for intermittent bouts of headaches.    On 5/28 transferred to ICU after a seizure episode with left gaze ad left neck turning with jerking followed by postictal. LTM EEG showed no seizure activity. Depakote level was low at 45, Keppra began. Lethargy now resolved.   Complete NIHSS TOTAL: 10 Glasgow Coma Scale Score: (!) 20   Past Medical History      Past Medical History:  Diagnosis Date  . Chronic combined systolic and  diastolic CHF (congestive heart failure) (HCC)      EF 25-30%  . Diastolic dysfunction    . Dyslipidemia    . Hyperlipidemia    . Hypertension    . Nonischemic dilated cardiomyopathy (HCC)      felt secondary to HTN with no ischemia on nuclear stress test 05/2012, EF 25-30%  . Shortness of breath        Family History  family history includes CAD in his mother; Heart attack in his maternal grandmother and mother; Hypertension in his father and mother.   Prior Rehab/Hospitalizations:  Has the patient had prior rehab or hospitalizations prior to admission? yes   Has the patient had major surgery during 100 days prior to admission? No   Current Medications    Current Facility-Administered Medications:  .   stroke: mapping our early stages of recovery book, , Does not apply, Once, Milon Dikes, MD .  acetaminophen (TYLENOL) tablet 1,000 mg, 1,000 mg, Oral, Q6H PRN, Dorcas Carrow, MD, 1,000 mg at 06/22/20 0912 .  amLODipine (NORVASC) tablet 10 mg, 10 mg, Oral, Daily, Marvel Plan, MD, 10 mg at 06/21/20 0914 .  Chlorhexidine Gluconate Cloth 2 % PADS 6 each, 6 each, Topical, Daily, Milon Dikes, MD, 6 each at 06/21/20 [phone] .  cloNIDine (CATAPRES) tablet 0.1 mg, 0.1 mg, Oral, TID, Marvel Plan, MD, 0.1 mg at 06/21/20 2159 .  enoxaparin (LOVENOX) injection 40 mg, 40 mg, Subcutaneous, Q24H, Micki Riley, MD, 40 mg at 06/21/20 1217 .  folic acid (FOLVITE) tablet 1 mg, 1 mg, Oral, Daily, Marvel Plan, MD, 1 mg at 06/21/20 0914 .  hydrALAZINE (APRESOLINE) injection 20 mg, 20 mg, Intravenous, Q4H PRN, Bailey-Modzik, Delila A, NP, 20 mg at 06/20/20 0515 .  labetalol (NORMODYNE) injection 40 mg, 40 mg, Intravenous, Q2H PRN, Micki Riley, MD, 40 mg at 06/20/20 1641 .  labetalol (NORMODYNE) tablet 100 mg, 100 mg, Oral, BID, Dorcas Carrow, MD, 100 mg at 06/21/20 2200 .  levETIRAcetam (KEPPRA) tablet 500 mg, 500 mg, Oral, BID, Marvel Plan, MD, 500 mg at 06/21/20 2200 .  losartan (COZAAR) tablet 50  mg, 50 mg, Oral, BID, Dorcas Carrow, MD, 50 mg at 06/21/20 2200 .  MEDLINE mouth  rinse, 15 mL, Mouth Rinse, BID, Milon Dikes, MD, 15 mL at 06/21/20 2202 .  multivitamin with minerals tablet 1 tablet, 1 tablet, Oral, Daily, Marvel Plan, MD, 1 tablet at 06/21/20 0914 .  ondansetron (ZOFRAN) injection 4 mg, 4 mg, Intravenous, Q6H PRN, Dorcas Carrow, MD, 4 mg at 06/21/20 1617 .  pantoprazole (PROTONIX) EC tablet 40 mg, 40 mg, Oral, Daily, Marvel Plan, MD, 40 mg at 06/21/20 0914 .  polyvinyl alcohol (LIQUIFILM TEARS) 1.4 % ophthalmic solution 1 drop, 1 drop, Both Eyes, Q6H PRN, Marvel Plan, MD .  QUEtiapine (SEROQUEL) tablet 25 mg, 25 mg, Oral, QHS, Marvel Plan, MD, 25 mg at 06/21/20 1831 .  senna-docusate (Senokot-S) tablet 1 tablet, 1 tablet, Oral, BID, Milon Dikes, MD, 1 tablet at 06/21/20 2200 .  thiamine (B-1) injection 100 mg, 100 mg, Intravenous, Daily, 100 mg at 06/19/20 0914 **OR** thiamine tablet 100 mg, 100 mg, Oral, Daily, Kirby-Graham, Beather Arbour, NP, 100 mg at 06/21/20 0867   Patients Current Diet:     Diet Order                      DIET DYS 3 Room service appropriate? Yes; Fluid consistency: Thin  Diet effective now                      Precautions / Restrictions Precautions Precautions: Fall,Other (comment) Precaution Comments: Lt hemi with right gaze preference Restrictions Weight Bearing Restrictions: No    Has the patient had 2 or more falls or a fall with injury in the past year?No   Prior Activity Level Community (5-7x/wk): drives, works; gets out of house daily works as a Interior and spatial designer Level Prior Function Level of Independence: Independent Comments: Pt drives a forklift.   Self Care: Did the patient need help bathing, dressing, using the toilet or eating?  Independent   Indoor Mobility: Did the patient need assistance with walking from room to room (with or without device)? Independent   Stairs: Did the patient need assistance  with internal or external stairs (with or without device)? Independent   Functional Cognition: Did the patient need help planning regular tasks such as shopping or remembering to take medications? Independent   Home Assistive Devices / Equipment Home Assistive Devices/Equipment: None Home Equipment: None   Prior Device Use: Indicate devices/aids used by the patient prior to current illness, exacerbation or injury? cane   Current Functional Level Cognition   Arousal/Alertness: Awake/alert Overall Cognitive Status: Impaired/Different from baseline Current Attention Level: Sustained Orientation Level: Oriented to person,Oriented to place,Oriented to time,Disoriented to situation Following Commands: Follows one step commands with increased time,Follows multi-step commands inconsistently Safety/Judgement: Decreased awareness of safety,Decreased awareness of deficits General Comments: pt awake with STM deficits needing redirection and repetition of tasks. pt with poor awareness of balance and strength deficits Attention: Sustained Sustained Attention: Impaired Sustained Attention Impairment: Verbal basic Memory: Impaired Memory Impairment: Retrieval deficit Awareness: Impaired Awareness Impairment: Emergent impairment,Anticipatory impairment Problem Solving: Impaired Problem Solving Impairment: Verbal basic Executive Function: Sequencing,Organizing Behaviors: Impulsive Safety/Judgment: Impaired    Extremity Assessment (includes Sensation/Coordination)   Upper Extremity Assessment: LUE deficits/detail LUE Deficits / Details: Decreased acitve movement. Poor attention to LUE and left side LUE Sensation: decreased light touch,decreased proprioception LUE Coordination: decreased fine motor,decreased gross motor  Lower Extremity Assessment: Defer to PT evaluation LLE Deficits / Details: adducting LLE LLE Sensation: decreased light touch,decreased proprioception LLE Coordination:  decreased fine motor,decreased gross motor  ADLs   Overall ADL's : Needs assistance/impaired Eating/Feeding: Set up,Sitting (will further assess; unsure if pt attending to full tray) Grooming: Minimal assistance,Sitting,Maximal assistance Grooming Details (indicate cue type and reason): Pt requiring assistance to maintain grasp on tooth paste with L hand. Also poor sitting balance when performing oral care and needing Max A for support at EOB Upper Body Bathing: Moderate assistance,Bed level Lower Body Bathing: Maximal assistance,Bed level Upper Body Dressing : Moderate assistance,Bed level Lower Body Dressing: Total assistance Toilet Transfer: Minimal assistance,+2 for physical assistance,Total assistance (sit<>stand wit stedy and then transfer to recliner) Toilet Transfer Details (indicate cue type and reason): Min A +2 for sit<>stand with stedy adn then transfer to recliner Toileting- Clothing Manipulation and Hygiene: Total assistance Functional mobility during ADLs: +2 for physical assistance,Minimal assistance (sit <> stand) General ADL Comments: Highly motivated. Decreased strength, balance, attention to L, and cognition     Mobility   Overal bed mobility: Needs Assistance Bed Mobility: Supine to Sit Rolling: Min assist,Min guard Sidelying to sit: Mod assist Supine to sit: Mod assist,+2 for physical assistance Sit to supine: Total assist,+2 for physical assistance,+2 for safety/equipment General bed mobility comments: physical assist with cues to hook RLE under LLE to pivot legs to EOB and mod +2 assist to lift trunk from surface toward right side of bed with mod cues to push into bed with right elbow     Transfers   Overall transfer level: Needs assistance Equipment used: Ambulation equipment used Transfer via Lift Equipment: Stedy Transfers: Sit to/from Stand Sit to Stand: [employer] physical assistance Squat pivot transfers: +2 physical assistance,Total assist General  transfer comment: with left knee blocked and bil UE hooked with PT/tech pt able to stand EOB and achieve fully upright with cues and assist for weight shifting to midine grossly 30 sec. Pt then stood with [facility] for pivot to chair     Ambulation / Gait / Stairs / Wheelchair Mobility   Ambulation/Gait General Gait Details: not yet able     Posture / Balance Dynamic Sitting Balance Sitting balance - Comments: pt with tendency for anterior left lean with cues for midline and upright posture. Pt able to correct at times but can only maintain for a couple seconds Balance Overall balance assessment: Needs assistance Sitting-balance support: Feet supported,Single extremity supported Sitting balance-Leahy Scale: Poor Sitting balance - Comments: pt with tendency for anterior left lean with cues for midline and upright posture. Pt able to correct at times but can only maintain for a couple seconds Postural control: Left lateral lean Standing balance support: Bilateral upper extremity supported Standing balance-Leahy Scale: Poor Standing balance comment: left lateral bias     Special needs/care consideration CIWA for alcohol withdrawal    Previous Home Environment  Living Arrangements: Spouse/significant other  Lives With: Spouse Available Help at Discharge: Family,Available 24 hours/day Type of Home: House Home Layout: One level Home Access: Stairs to enter Entrance Stairs-Rails: Left Entrance Stairs-Number of Steps: 1 Bathroom Shower/Tub: Armed forces operational officer Accessibility: Yes How Accessible: Accessible via walker Home Care Services: No   Discharge Living Setting Plans for Discharge Living Setting: Patient's home Type of Home at Discharge: House Discharge Home Layout: One level Discharge Home Access: Stairs to enter Entrance Stairs-Rails: Left Entrance Stairs-Number of Steps: 1 Discharge Bathroom Shower/Tub: Tub/shower unit Discharge Bathroom Toilet:  Standard Discharge Bathroom Accessibility: Yes How Accessible: Accessible via walker Does the patient have any problems obtaining your medications?: No   Social/Family/Support Systems Anticipated Caregiver:  Albertina ParrCheryl Williams, wife Anticipated Caregiver's Contact Information: [phone] Caregiver Availability: 24/7 (wife works but plans on having someone help pt when she is at work) Discharge Plan Discussed with Primary Caregiver: Yes Is Caregiver In Agreement with Plan?: Yes Does Caregiver/Family have Issues with Lodging/Transportation while Pt is in Rehab?: No   Goals Patient/Family Goal for Rehab: Mod A:PT/OT, Supervision: ST Expected length of stay: 21-23 days Pt/Family Agrees to Admission and willing to participate: Yes Program Orientation Provided & Reviewed with Pt/Caregiver Including Roles  & Responsibilities: Yes   Decrease burden of Care through IP rehab admission: NA   Possible need for SNF placement upon discharge:Not anticipated   Patient Condition: This patient's medical and functional status has changed since the consult dated: 06/14/2020 in which the Rehabilitation Physician determined and documented that the patient's condition is appropriate for intensive rehabilitative care in an inpatient rehabilitation facility. See "History of Present Illness" (above) for medical update. Functional changes are: mod assist overall. Patient's medical and functional status update has been discussed with the Rehabilitation physician and patient remains appropriate for inpatient rehabilitation. Will admit to inpatient rehab 06/22/2020   Preadmission Screen Completed By:  Clois DupesBoyette, Terryl Niziolek Godwin, RN, 06/22/2020 10:07 AM ______________________________________________________________________   Discussed status with Dr. Riley KillSwartz on  06/22/2020 at 1008 and received approval for admission **   Admission Coordinator:  Clois DupesBoyette, Kalee Broxton Godwin, time [id number] Date 06/22/2020             Cosigned by:  Ranelle OysterSwartz, Zachary T, MD at 06/22/2020 10:10 AM    Revision History                                                                     Note Details  Author Standley BrookingBoyette, Sharry Beining G, RN File Time 06/22/2020 10:08 AM  Author Type Rehab Admission Coordinator Status Signed  Last Editor Standley BrookingBoyette, Annebelle Bostic G, RN Service Physical Medicine and Rehabilitation

## 2020-06-22 NOTE — Progress Notes (Signed)
Inpatient Rehabilitation Admissions Coordinator  I have CIR bed and can admit patient today. I met at bedside with patient and I will call his wife to make her aware. Acute team and TOC made aware. I will make the arrangements to admit today.  Danne Baxter, RN, MSN Rehab Admissions Coordinator [phone] 06/22/2020 10:04 AM

## 2020-06-22 NOTE — Progress Notes (Signed)
Physical Therapy Treatment Patient Details Name: [name] MRN: [id number] DOB: 1965/10/01 Today's Date: 06/22/2020    History of Present Illness 55 y.o. male who presented 5/18 with L sided weakness, numbness, and right gaze preference. No tPA administered due to ICH. 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 5/27 pt with AMS with encephalopathy with repeat EEG and CT and ICU transfer. PMH: HTN, obesity, cardiomyopathy, hyperlipidemia, and CHF.    PT Comments    Pt making gradual progress.  Very motivated and follows commands well.  Very light min A of 2 for safety to stand.  Max x 2 for taking a few steps with L knee blocked and arm supported.  Did well balancing using back of recliner as it was able to support L arm.  Continue to advance as able.     Follow Up Recommendations  CIR;Supervision/Assistance - 24 hour     Equipment Recommendations  3in1 (PT);Wheelchair cushion (measurements PT);Wheelchair (measurements PT)    Recommendations for Other Services Rehab consult     Precautions / Restrictions Precautions Precautions: Fall;Other (comment) Precaution Comments: Lt hemi with right gaze preference    Mobility  Bed Mobility Overal bed mobility: Needs Assistance Bed Mobility: Sit to Sidelying;Rolling;Supine to Sit Rolling: Min guard   Supine to sit: Mod assist;+2 for physical assistance   Sit to sidelying: Max assist;+2 for physical assistance General bed mobility comments: Mod A to faciltiate BLEs over EOB and then elevate trunk. Max A +2 for reutnr ot sidelying and then supine    Transfers Overall transfer level: Needs assistance Equipment used:  (back of recliner) Transfers: Sit to/from Stand Sit to Stand: Min assist;+2 physical assistance         General transfer comment: L knee blocked. Min A +2 for power up into standing - performed x 6 during session.  Pt cued to place L arm on recliner ,  stabilized by therapist then once up able to maintain elbow on back of recliner.  Ambulation/Gait Ambulation/Gait assistance: Max assist;+2 physical assistance Gait Distance (Feet): 3 Feet (3' forward, 2'x2 sidestep to L) Assistive device:  (back of recliner) Gait Pattern/deviations: Step-to pattern;Decreased stride length Gait velocity: decrease   General Gait Details: Had OT student stabilize recliner when unlocked.  PT and OT assisting pt.  Requiring assist to move L leg and block knee during stance.  Cues for weight shifting and sequencing.  Pt following commands very well and waiting for instructions on each step to be safe. Max A x 2 to support particularly in L stance.   Stairs             Wheelchair Mobility    Modified Rankin (Stroke Patients Only) Modified Rankin (Stroke Patients Only) Pre-Morbid Rankin Score: No symptoms Modified Rankin: Severe disability     Balance Overall balance assessment: Needs assistance Sitting-balance support: Feet supported;Single extremity supported Sitting balance-Leahy Scale: Poor Sitting balance - Comments: Slight posterior lean and left lateral lean at EOB. increased control as session progressed Postural control: Left lateral lean;Posterior lean Standing balance support: Bilateral upper extremity supported;During functional activity Standing balance-Leahy Scale: Poor Standing balance comment: left lateral bias; stood multiple times holding back of recliner; cues for tucking buttock                            Cognition Arousal/Alertness: Awake/alert Behavior During Therapy: WFL for tasks assessed/performed Overall Cognitive  Status: Impaired/Different from baseline Area of Impairment: Attention;Following commands;Safety/judgement;Awareness;Problem solving;Memory                   Current Attention Level: Sustained Memory: Decreased short-term memory Following Commands: Follows one step commands with increased  time;Follows multi-step commands inconsistently Safety/Judgement: Decreased awareness of safety;Decreased awareness of deficits Awareness: Intellectual;Emergent Problem Solving: Slow processing;Requires tactile cues;Requires verbal cues General Comments: Upon arrival, pt stating "I dont feel well."  Initially have difficulty expressing what feels bad but with repeated questions was able to described feeling down and with a HA. Pt continues to present with decreased sequencing, attention, following commands, and problem solving. Requiring increased time and cues. Highly motivated.      Exercises      General Comments        Pertinent Vitals/Pain Pain Assessment: Faces Faces Pain Scale: Hurts little more Pain Location: head Pain Descriptors / Indicators: Headache Pain Intervention(s): Monitored during session;Limited activity within patient's tolerance    Home Living                      Prior Function            PT Goals (current goals can now be found in the care plan section) Acute Rehab PT Goals Patient Stated Goal: to get rehab and get better PT Goal Formulation: With patient/family Time For Goal Achievement: 07/05/20 Potential to Achieve Goals: Good Progress towards PT goals: Progressing toward goals    Frequency    Min 4X/week      PT Plan Current plan remains appropriate    Co-evaluation PT/OT/SLP Co-Evaluation/Treatment: Yes Reason for Co-Treatment: Complexity of the patient's impairments (multi-system involvement);For patient/therapist safety PT goals addressed during session: Mobility/safety with mobility;Balance;Strengthening/ROM;Proper use of DME OT goals addressed during session: ADL's and self-care      AM-PAC PT "6 Clicks" Mobility   Outcome Measure  Help needed turning from your back to your side while in a flat bed without using bedrails?: A Little Help needed moving from lying on your back to sitting on the side of a flat bed without  using bedrails?: Total Help needed moving to and from a bed to a chair (including a wheelchair)?: Total Help needed standing up from a chair using your arms (e.g., wheelchair or bedside chair)?: A Little Help needed to walk in hospital room?: Total Help needed climbing 3-5 steps with a railing? : Total 6 Click Score: 10    End of Session Equipment Utilized During Treatment: Gait belt Activity Tolerance: Patient tolerated treatment well Patient left: with call bell/phone within reach;in bed;with bed alarm set Nurse Communication: Mobility status PT Visit Diagnosis: Unsteadiness on feet (R26.81);Muscle weakness (generalized) (M62.81);Difficulty in walking, not elsewhere classified (R26.2);Other symptoms and signs involving the nervous system (R29.898);Hemiplegia and hemiparesis Hemiplegia - Right/Left: Left Hemiplegia - dominant/non-dominant: Non-dominant Hemiplegia - caused by: Nontraumatic intracerebral hemorrhage     Time: 1237-1306 PT Time Calculation (min) (ACUTE ONLY): 29 min  Charges:  $Gait Training: 8-22 mins                     Anise Salvo, PT Acute Rehab Services Pager [phone] [facility] [phone]     Rayetta Humphrey 06/22/2020, 2:26 PM

## 2020-06-22 NOTE — Discharge Summary (Signed)
Physician Discharge Summary  [name] HKV:[id number] DOB: 07/17/1965 DOA: 06/09/2020  PCP: Pcp, No  Admit date: 06/09/2020 Discharge date: 06/22/2020  Time spent: 45 minutes  Recommendations for Outpatient Follow-up:  Patient will be discharged to [facility].  Patient will need to follow up with primary care provider within one week of discharge.  Patient will need to follow up with neurology after discharge from rehab. Patient should continue medications as prescribed.  Patient should follow a dysphagia 3 diet.   Discharge Diagnoses:  ICH Seizure activity Hypertensive emergency/uncontrolled hypertension Hyperlipidemia Acute kidney injury Dysphagia Alcohol abuse Sundowning Obesity Possible obstructive sleep apnea Hyperkalemia Hyponatremia   Discharge Condition: Stable  Diet recommendation: dysphagia 3  Filed Weights   06/19/20 0606 06/20/20 0500 06/21/20 0500  Weight: 109.5 kg 106.1 kg 106.3 kg    History of present illness:  on 06/09/2020 by Dr. Milon Dikes (Neurology) Dalton [name] IIIis a 55 y.o.malewith a PMHx of obesity, HTN, non ischemic dilated cardiomyopathy, HLD, Cs/dHF who presents to ED via EMS as a code stroke. LKW 2200 hours 06/08/20. Per patient, he woke up around 0700 hours, but was weak. His wife noted via Ring doorbell that patient did not go to work. She left work early and came home at 1500 hours to find patient lying in the bathroom. He had a right gaze preference, right sided weakness, and right sided numbness. 911 was called. BP 200s in the field, and higher up to 230s here. Bleed was suspected due to patient c/o HA with high BP.   No personal of FMHx of stroke.   After brief exam on ED bridge with airway clearance, patient was taken emergently to CT. 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 54mm shift.   Patient was taken back to ED and after placement of [place] IV, Labetalol 10mg  IV push was  given and Clexiprex was started. Target BP 120-140. Patient became more sleepy in ED room and was snoring at times. ED MD informed that patient may need intubation. His O2 sats stayed 100% with 15L NRB.   A bolus of 3% saline was ordered but additional IV being started.   Wife came and Dr. spoke with her. She states he takes his medicines routinely, but patient stated he did not. He is not on any anticoagulation or antiplatelet medications.   In review of chart, it appears he was first started on anti hypertensives by [facility] in 2020. It also appears he was at [facility] multiple times for for medication refills. He was referred to PCP, but unknown if he went. NP does not see OV note for a PCP, but he may be seeing someone without Epic EMR.  Hospital Course:  ICH -Patient was admitted with right-sided gaze, right-sided weakness and numbness with fall -CT head showed right thalamic ICH with IVH with 4 mm midline shift -MRI showed right thalamic ICH with IVH and midline shift -MRA showed no aneurysm or AVM -Repeat CT x2 showed stable right thalamic hemorrhage with mild intraventricular extension -Echocardiogram showed an EF of 65 to 70%, no regional motion abnormalities, severe LVH, indeterminate diastolic filling, trivial mitral and aortic valve regurg -Carotid ultrasound was unremarkable -LDL 78, hemoglobin A1c 5.5 -UDS was unremarkable -Patient has not been on any type of antithrombotic given ICH -PT and OT recommending inpatient rehab  Seizure activity -Became altered and noted to have depressed consciousness with left gaze and head deviation on 5/27 -LTM EEG showed no  seizure activity -Currently on Keppra -Continue seizure precautions  Hypertensive emergency/uncontrolled hypertension -Home medications included metoprolol 100 mg twice daily, HCTZ, losartan -Goal SBP less than 160 -Patient did require Cleviprex -Currently on amlodipine, labetalol, losartan, clonidine -Continue to  monitor closely  Hyperlipidemia -Lipid panel: Total cholesterol 96, HDL 52, LDL 78, triglycerides initially 332 however improved to 153 on 06/12/2020 -Patient normally on statin at home -Currently statin held due to possibility of worsening ICH. -Discussed with neurology, Dr. Roda Shutters, patient can be restarted on statin at discharge  Acute kidney injury -Improved -Creatinine peaked to 1.92 -Creatinine improved down to 1.32  Dysphagia -Speech therapy consulted, currently on dysphagia 3 diet with thin liquids -Continue aspiration precautions  Alcohol abuse -Patient drinks about 24 alcoholic drinks per week -Continue CIWA protocol  -Currently on folic acid, multivitamin and thiamine  Sundowning -Patient with delirium and confusion in the evening and was started on Seroquel -Continue to monitor closely  Obesity -BMI 33.63 -Patient to follow-up with PCP on discharge for lifestyle modifications  Possible obstructive sleep apnea -Patient will need sleep study as an outpatient  Hyperkalemia -Mild, potassium 5.7 -Will give dose of Kayexalate and monitor BMP  Hyponatremia -Appears to be improving, sodium 134 today, continue to monitor BMP  Consultants Neurology PCCM Inpatient rehab  Procedures  Echocardiogram Carotid Doppler EEG  Discharge Exam: Vitals:   06/22/20 0403 06/22/20 0837  BP: (!) 124/98 (!) 141/77  Pulse: 85 (!) 102  Resp: 20 18  Temp: 98.3 F (36.8 C) 98.1 F (36.7 C)  SpO2: 97% 92%    Exam  General: Well developed, chronically ill-appearing, NAD  HEENT: NCAT, mucous membranes moist.   Cardiovascular: S1 S2 auscultated, RRR.  Respiratory: Clear to auscultation bilaterally with equal chest rise  Abdomen: Soft, obese, nontender, nondistended, + bowel sounds  Extremities: warm dry without cyanosis clubbing or edema  Neuro: AAOx3, RUE strength 4/5, RLE strength 3/5, LUE 1/5, LLE 2/5.  Left facial droop, issues with bilateral gaze and  tracking   Discharge Instructions Discharge Instructions    Ambulatory referral to Neurology   Complete by: As directed    An appointment is requested in approximately: 4-6weeks   Discharge instructions   Complete by: As directed    Patient will be discharged to [facility].  Patient will need to follow up with primary care provider within one week of discharge.  Patient will need to follow up with neurology after discharge from rehab. Patient should continue medications as prescribed.  Patient should follow a dysphagia 3 diet.     Allergies as of 06/22/2020   No Known Allergies     Medication List    STOP taking these medications   hydrochlorothiazide 12.5 MG tablet Commonly known as: HYDRODIURIL   ibuprofen 200 MG tablet Commonly known as: ADVIL   metoprolol tartrate 100 MG tablet Commonly known as: LOPRESSOR   predniSONE 10 MG tablet Commonly known as: DELTASONE     TAKE these medications   amLODipine 10 MG tablet Commonly known as: NORVASC Take 1 tablet (10 mg total) by mouth daily.   atorvastatin 40 MG tablet Commonly known as: LIPITOR Take 1 tablet (40 mg total) by mouth daily at 6 PM.   cloNIDine 0.1 MG tablet Commonly known as: CATAPRES Take 1 tablet (0.1 mg total) by mouth 3 (three) times daily.   folic acid 1 MG tablet Commonly known as: FOLVITE Take 1 tablet (1 mg total) by mouth daily.   labetalol 100 MG tablet Commonly known  as: NORMODYNE Take 1 tablet (100 mg total) by mouth 2 (two) times daily.   levETIRAcetam 500 MG tablet Commonly known as: KEPPRA Take 1 tablet (500 mg total) by mouth 2 (two) times daily.   losartan 50 MG tablet Commonly known as: COZAAR Take 1 tablet (50 mg total) by mouth 2 (two) times daily. What changed: when to take this   multivitamin with minerals Tabs tablet Take 1 tablet by mouth daily.   pantoprazole 40 MG tablet Commonly known as: PROTONIX Take 1 tablet (40 mg total) by mouth daily.   polyvinyl  alcohol 1.4 % ophthalmic solution Commonly known as: LIQUIFILM TEARS Place 1 drop into both eyes every 6 (six) hours as needed for dry eyes.   QUEtiapine 25 MG tablet Commonly known as: SEROQUEL Take 1 tablet (25 mg total) by mouth at bedtime.   thiamine 100 MG tablet Take 1 tablet (100 mg total) by mouth daily.      No Known Allergies    The results of significant diagnostics from this hospitalization (including imaging, microbiology, ancillary and laboratory) are listed below for reference.    Significant Diagnostic Studies: CT HEAD WO CONTRAST  Result Date: 06/19/2020 CLINICAL DATA:  History of right thalamic hemorrhage EXAM: CT HEAD WITHOUT CONTRAST TECHNIQUE: Contiguous axial images were obtained from the base of the skull through the vertex without intravenous contrast. COMPARISON:  06/18/2020 FINDINGS: Brain: The known right thalamic hemorrhage seen on prior study is grossly unchanged on this exam. There is mild increased surrounding edema, with persistent mass effect upon the third ventricle. Stable mild ventriculomegaly with minimal blood products layering dependently within the occipital horns of the lateral ventricles. No frank hydrocephalus at this time. No acute extra-axial fluid collections. Vascular: No hyperdense vessel or unexpected calcification. Skull: Normal. Negative for fracture or focal lesion. Sinuses/Orbits: No acute finding. Other: None. IMPRESSION: 1. Stable size of the right thalamic hemorrhage, with minimal increase in surrounding edema. 2. Stable mass effect upon the third ventricle, with stable mild dilation of the lateral ventricles without frank hydrocephalus. 3. Stable decompression of the thalamic hemorrhage into the ventricular system, with minimal blood products layering dependently within the occipital horns as before. Electronically Signed   By: Sharlet Salina M.D.   On: 06/19/2020 21:43   CT Head Wo Contrast  Result Date: 06/18/2020 CLINICAL DATA:   Stroke follow-up. EXAM: CT HEAD WITHOUT CONTRAST TECHNIQUE: Contiguous axial images were obtained from the base of the skull through the vertex without intravenous contrast. COMPARISON:  Jun 18, 2019. FINDINGS: Brain: Right thalamic hemorrhage is unchanged in size. Similar intraventricular extension of hemorrhage with small volume of hemorrhage layering in the occipital horns bilaterally. Similar mild ventriculomegaly with mild rounding of the temporal horns. There is similar surrounding edema. Similar leftward midline shift with effacement of the third ventricle. No evidence of acute/interval large vascular territory infarct. Similar patchy white matter hypoattenuation, most likely related to chronic microvascular ischemic disease. Vascular: No hyperdense vessels identified. Skull: No acute fracture. Sinuses/Orbits: Visualized sinuses are largely clear. No acute orbital abnormality. Other: No mastoid effusions. IMPRESSION: 1. Similar size of the right thalamic hemorrhage with similar intraventricular extension. Similar leftward midline shift. 2. Similar mild ventriculomegaly. Recommend continued attention on follow-up. Electronically Signed   By: Feliberto Harts MD   On: 06/18/2020 18:38   CT HEAD WO CONTRAST  Result Date: 06/17/2020 CLINICAL DATA:  Intracranial hemorrhage EXAM: CT HEAD WITHOUT CONTRAST TECHNIQUE: Contiguous axial images were obtained from the base of the skull through  the vertex without intravenous contrast. COMPARISON:  CT head 06/11/2020 FINDINGS: Brain: Right thalamic hemorrhage unchanged in size. Mild intraventricular hemorrhage shows partial clearing. Mild midline shift to the left is unchanged. Mild ventricular dilatation has progressed in the interval. Patchy white matter hypodensity bilaterally most consistent with chronic microvascular ischemia. No acute ischemic infarct. Vascular: Negative for hyperdense vessel Skull: Negative Sinuses/Orbits: Mild mucosal edema paranasal sinuses.  Left cataract extraction. Chronic nasal bone fracture unchanged. Other: None IMPRESSION: Right thalamic hematoma stable Partial clearing of intraventricular hemorrhage. Mild ventricular enlargement, with progression since the prior study. Electronically Signed   By: Marlan Palau M.D.   On: 06/17/2020 09:18   CT HEAD WO CONTRAST  Result Date: 06/11/2020 CLINICAL DATA:  Left arm weakness. Intracranial hemorrhage follow-up EXAM: CT HEAD WITHOUT CONTRAST TECHNIQUE: Contiguous axial images were obtained from the base of the skull through the vertex without intravenous contrast. COMPARISON:  Head CT 06/10/2020 FINDINGS: Brain: Unchanged intraparenchymal hemorrhage centered in the right thalamus with intraventricular extension. Size and configuration of the ventricles is unchanged. Mild leftward midline shift is also unchanged. No new site of hemorrhage. Vascular: No abnormal hyperdensity of the major intracranial arteries or dural venous sinuses. No intracranial atherosclerosis. Skull: The visualized skull base, calvarium and extracranial soft tissues are normal. Sinuses/Orbits: No fluid levels or advanced mucosal thickening of the visualized paranasal sinuses. No mastoid or middle ear effusion. The orbits are normal. IMPRESSION: 1. Unchanged size and configuration of right thalamic intraparenchymal hemorrhage with intraventricular extension. 2. Unchanged mild leftward midline shift. Electronically Signed   By: Deatra Robinson M.D.   On: 06/11/2020 18:49   CT HEAD WO CONTRAST  Result Date: 06/09/2020 CLINICAL DATA:  Intracranial hemorrhage follow EXAM: CT HEAD WITHOUT CONTRAST TECHNIQUE: Contiguous axial images were obtained from the base of the skull through the vertex without intravenous contrast. COMPARISON:  06/09/2020 at 4:10 p.m. FINDINGS: Brain: Unchanged appearance of intracranial hemorrhage in the right thalamus with extension into the ventricles. 4 mm leftward midline shift is also unchanged. Vascular:  No abnormal hyperdensity of the major intracranial arteries or dural venous sinuses. No intracranial atherosclerosis. Skull: The visualized skull base, calvarium and extracranial soft tissues are normal. Sinuses/Orbits: No fluid levels or advanced mucosal thickening of the visualized paranasal sinuses. No mastoid or middle ear effusion. The orbits are normal. IMPRESSION: 1. Unchanged appearance of right thalamic intracranial hemorrhage with extension into the ventricles. 2. Unchanged 4 mm leftward midline shift. Electronically Signed   By: Deatra Robinson M.D.   On: 06/09/2020 22:18   CT SOFT TISSUE NECK WO CONTRAST  Result Date: 06/09/2020 CLINICAL DATA:  Foreign body in the neck. EXAM: CT NECK WITHOUT CONTRAST TECHNIQUE: Multidetector CT imaging of the neck was performed following the standard protocol without intravenous contrast. COMPARISON:  None. FINDINGS: PHARYNX AND LARYNX: The nasopharynx, oropharynx and larynx are normal. Visible portions of the oral cavity, tongue base and floor of mouth are normal. Normal epiglottis, vallecula and pyriform sinuses. The larynx is normal. No retropharyngeal abscess, effusion or lymphadenopathy. SALIVARY GLANDS: Normal parotid, submandibular and sublingual glands. THYROID: Normal. LYMPH NODES: No enlarged or abnormal density lymph nodes. VASCULAR: Major cervical vessels are patent. Left IJ central venous catheter. LIMITED INTRACRANIAL: Normal. VISUALIZED ORBITS: Normal. MASTOIDS AND VISUALIZED PARANASAL SINUSES: No fluid levels or advanced mucosal thickening. No mastoid effusion. SKELETON: No bony spinal canal stenosis. No lytic or blastic lesions. UPPER CHEST: Clear. OTHER: None. IMPRESSION: Normal CT of the neck. Electronically Signed   By: Chrisandra Netters.D.  On: 06/09/2020 22:22   CT CHEST WO CONTRAST  Result Date: 06/09/2020 CLINICAL DATA:  Mediastinal widening on chest radiograph, possible foreign body EXAM: CT CHEST WITHOUT CONTRAST TECHNIQUE: Multidetector CT  imaging of the chest was performed following the standard protocol without IV contrast. COMPARISON:  Radiograph 06/09/2020, 01/02/2018, contemporary neck radiograph FINDINGS: Cardiovascular: Left IJ approach central venous catheter tip terminates near the level of the left brachiocephalic-superior caval junction. Cardiac size is top normal no sizable pericardial effusion. Atherosclerotic plaque within the normal caliber aorta. No hyperdense mural thickening to suggest an intra mural hematoma. No periaortic stranding or hemorrhage is evident on this unenhanced exam. Central pulmonary arteries are normal caliber. Luminal evaluation of the vasculature is precluded in the absence of contrast media. Mediastinum/Nodes: Some pronounced upper mediastinal widening seen on the chest radiograph is likely a combination of portable technique and abundant mediastinal fat noted on this exam. No mediastinal fluid or gas. Normal thyroid gland and thoracic inlet. No acute abnormality of the trachea or esophagus. No worrisome mediastinal or axillary adenopathy. Hilar nodal evaluation is limited in the absence of intravenous contrast media. Lungs/Pleura: Low lung volumes and atelectasis with more bandlike subsegmental atelectasis towards the lung bases. No effusion. No pneumothorax. Upper Abdomen: No acute abnormalities present in the visualized portions of the upper abdomen. Musculoskeletal: No acute osseous abnormality or suspicious osseous lesion. Degenerative changes are present in the imaged spine and shoulders. No worrisome chest wall mass. No unexpected radiodense foreign bodies. IMPRESSION: 1. No expected radiopaque foreign body seen in the chest. 2. Left IJ approach central venous catheter tip terminates at the left brachiocephalic-superior caval junction. 3. Mediastinal widening present on comparison chest radiograph is likely a combination artifactual accentuation due to portable technique and abundant mediastinal fat noted  on this exam. No worrisome aortic dilatation, or other acute aortic abnormality within the limitations of an unenhanced CT. 4. Basilar atelectatic changes. Electronically Signed   By: Kreg Shropshire M.D.   On: 06/09/2020 23:26   MR ANGIO HEAD WO CONTRAST  Result Date: 06/10/2020 CLINICAL DATA:  Cerebral hemorrhage. EXAM: MRA HEAD WITHOUT CONTRAST TECHNIQUE: Angiographic images of the Circle of Willis were acquired using MRA technique without intravenous contrast. COMPARISON:  No pertinent prior exam. FINDINGS: Motion limited exam.  Within this limitation: Anterior circulation: No large vessel occlusion or evidence of proximal hemodynamically significant stenosis. No evidence of hemodynamically significant proximal stenosis, although evaluation limited by motion. No aneurysm identified. No evidence of vascular malformation in the region of the right thalamic hemorrhage, although acute blood products limit evaluation. There is intrinsic T1 hyperintensity within the hemorrhage, which also limits evaluation. Posterior circulation: No large vessel occlusion or evidence of proximal hemodynamically significant stenosis. Bilateral posterior communicating arteries. No aneurysm. IMPRESSION: 1. No evidence of a vascular malformation in the region of the right thalamic hemorrhage, although acute blood products limit evaluation. 2. No large vessel occlusion or evidence of proximal hemodynamically significant stenosis on this motion limited exam. Electronically Signed   By: Feliberto Harts MD   On: 06/10/2020 12:02   MR BRAIN W WO CONTRAST  Result Date: 06/10/2020 CLINICAL DATA:  Hemorrhage. EXAM: MRI HEAD WITHOUT AND WITH CONTRAST TECHNIQUE: Multiplanar, multiecho pulse sequences of the brain and surrounding structures were obtained without and with intravenous contrast. CONTRAST:  10mL GADAVIST GADOBUTROL 1 MMOL/ML IV SOLN COMPARISON:  CT Jun 09, 2020. FINDINGS: Mildly motion limited exam. Brain: When comparing across  modalities, no substantial change in size/extent of an acute right thalamic hemorrhage,  measuring up to approximately 3.8 by 2.4 cm. The hemorrhage demonstrates areas of intrinsic T1 hyperintensity. Similar extraventricular extension with blood products in right greater than left lateral ventricles, third ventricle, and fourth ventricle. No progressive ventriculomegaly. Surrounding edema with similar 4 mm of leftward midline shift. Outside of the hemorrhage itself, and no confluent restricted diffusion to suggest acute infarct. No visible mass lesion, although acute blood products limit evaluation. Moderate patchy T2/FLAIR hyperintensities within the white matter and pons, nonspecific but most likely related to chronic microvascular ischemic disease. Vascular: Further evaluated on concurrent MRA. Skull and upper cervical spine: Normal marrow signal. Sinuses/Orbits: Clear sinuses.  No acute orbital finding. Other: No mastoid effusions. IMPRESSION: 1. When comparing across modalities, no substantial change in size/extent of an acute right thalamic hemorrhage with intraventricular extension, as detailed above. Similar 4 mm of leftward midline shift. No progressive ventriculomegaly. 2. No specific evidence of acute infarct or visible mass lesion, although acute blood products limits evaluation. A follow-up MRI after resolution of hemorrhage could further evaluate if clinically indicated. 3. Moderate chronic microvascular ischemic disease. Electronically Signed   By: Feliberto Harts MD   On: 06/10/2020 11:51   DG Chest Portable 1 View  Result Date: 06/09/2020 CLINICAL DATA:  55 year old male with history of central line placement. EXAM: PORTABLE CHEST 1 VIEW COMPARISON:  Chest x-ray 01/02/2018. FINDINGS: Left-sided internal jugular central venous catheter with tip terminating near the junction of the innominate vein with the superior vena cava. Lung volumes are low. No consolidative airspace disease. No pleural  effusions. No pneumothorax. No evidence of pulmonary edema. Heart size is mildly enlarged. Marked widening of the mediastinum, new compared to the prior examination. IMPRESSION: 1. Left internal jugular central venous catheter in position, as above, with profound widening of the mediastinum. If there is clinical concern for aortic dissection and/or mediastinal hematoma, further evaluation with chest CTA would be recommended. Electronically Signed   By: Trudie Reed M.D.   On: 06/09/2020 18:11   EEG adult  Result Date: 06/18/2020 Charlsie Quest, MD     06/18/2020 10:26 PM Patient Name: [name] MRN: [id number] Epilepsy Attending: Charlsie Quest Referring Physician/Provider: Dr Delia Heady Date: 06/18/2020 Duration: 21.18 mins Patient history:  55yo M with acute neurological change in his level of consciousness with leftward head and eye deviation. EEG to evaluate seizure activity. Level of alertness: Awake AEDs during EEG study: VPA Technical aspects: This EEG study was done with scalp electrodes positioned according to the 10-20 International system of electrode placement. Electrical activity was acquired at a sampling rate of 500Hz  and reviewed with a high frequency filter of 70Hz  and a low frequency filter of 1Hz . EEG data were recorded continuously and digitally stored. Description: No posterior dominant rhythm was seen. EEG showed continuous generalized and lateralized right hemisphere 3 to 6 Hz theta-delta slowing admixed with generalized 15-18Hz  beta activity. Hyperventilation and photic stimulation were not performed.   ABNORMALITY - Continuous slow, generalized  and lateralized right hemisphere IMPRESSION: This study is  suggestive of suggestive of cortical dysfunction arising from right hemisphere likely secondary to underlying bleed as well as moderate diffuse encephalopathy, nonspecific etiology. No seizures or definite epileptiform discharges were seen throughout the recording. Dr  Derry Lory was notified. Charlsie Quest   ECHOCARDIOGRAM COMPLETE  Result Date: 06/10/2020    ECHOCARDIOGRAM REPORT   Patient Name:   [name] Date of Exam: 06/10/2020 Medical Rec #:  [id number]      Height:  70.0 in Accession #:    [id number]     Weight:       251.1 lb Date of Birth:  15-Apr-1965      BSA:          2.299 m Patient Age:    54 years       BP:           140/71 mmHg Patient Gender: M              HR:           102 bpm. Exam Location:  Inpatient Procedure: 2D Echo, Color Doppler and Cardiac Doppler Indications:    CVA  History:        Patient has prior history of Echocardiogram examinations, most                 recent 08/12/2014. Cardiomyopathy and CHF,                 Signs/Symptoms:Shortness of Breath; Risk Factors:Hypertension,                 Dyslipidemia and Diabetes.  Sonographer:    Neomia Dear RDCS Referring Phys: [id number] ASHISH ARORA  Sonographer Comments: Patient is morbidly obese and Technically difficult study due to poor echo windows. IMPRESSIONS  1. Left ventricular ejection fraction, by estimation, is 65 to 70%. The left ventricle has normal function. The left ventricle has no regional wall motion abnormalities. There is severe left ventricular hypertrophy. Indeterminate diastolic filling due to E-A fusion.  2. Right ventricular systolic function is normal. The right ventricular size is normal. There is normal pulmonary artery systolic pressure. The estimated right ventricular systolic pressure is 28.4 mmHg.  3. The mitral valve is normal in structure. Trivial mitral valve regurgitation. No evidence of mitral stenosis.  4. The aortic valve is normal in structure. Aortic valve regurgitation is trivial. No aortic stenosis is present.  5. The inferior vena cava is normal in size with greater than 50% respiratory variability, suggesting right atrial pressure of 3 mmHg.  6. Increased flow velocities may be secondary to anemia, thyrotoxicosis, hyperdynamic or high flow state.  Conclusion(s)/Recommendation(s): No intracardiac source of embolism detected on this transthoracic study. A transesophageal echocardiogram is recommended to exclude cardiac source of embolism if clinically indicated. FINDINGS  Left Ventricle: Left ventricular ejection fraction, by estimation, is 65 to 70%. The left ventricle has normal function. The left ventricle has no regional wall motion abnormalities. The left ventricular internal cavity size was normal in size. There is  severe left ventricular hypertrophy. Indeterminate diastolic filling due to E-A fusion. Right Ventricle: The right ventricular size is normal. No increase in right ventricular wall thickness. Right ventricular systolic function is normal. There is normal pulmonary artery systolic pressure. The tricuspid regurgitant velocity is 2.52 m/s, and  with an assumed right atrial pressure of 3 mmHg, the estimated right ventricular systolic pressure is 28.4 mmHg. Left Atrium: Left atrial size was normal in size. Right Atrium: Right atrial size was normal in size. Pericardium: There is no evidence of pericardial effusion. Mitral Valve: The mitral valve is normal in structure. Trivial mitral valve regurgitation. No evidence of mitral valve stenosis. MV peak gradient, 13.0 mmHg. The mean mitral valve gradient is 4.0 mmHg. Tricuspid Valve: The tricuspid valve is normal in structure. Tricuspid valve regurgitation is trivial. No evidence of tricuspid stenosis. Aortic Valve: The aortic valve is normal in structure. Aortic valve regurgitation is trivial. No aortic stenosis is present. Aortic  valve mean gradient measures 7.0 mmHg. Aortic valve peak gradient measures 13.8 mmHg. Aortic valve area, by VTI measures 3.77 cm. Pulmonic Valve: The pulmonic valve was not well visualized. Pulmonic valve regurgitation is trivial. No evidence of pulmonic stenosis. Aorta: The aortic root is normal in size and structure. Venous: The inferior vena cava is normal in size with  greater than 50% respiratory variability, suggesting right atrial pressure of 3 mmHg. IAS/Shunts: No atrial level shunt detected by color flow Doppler.  LEFT VENTRICLE PLAX 2D LVIDd:         4.00 cm     Diastology LVIDs:         2.00 cm     LV e' medial:    4.57 cm/s LV PW:         1.70 cm     LV E/e' medial:  16.3 LV IVS:        1.60 cm     LV e' lateral:   7.83 cm/s LVOT diam:     2.30 cm     LV E/e' lateral: 9.5 LV SV:         106 LV SV Index:   46 LVOT Area:     4.15 cm  LV Volumes (MOD) LV vol d, MOD A2C: 78.9 ml LV vol d, MOD A4C: 93.0 ml LV vol s, MOD A2C: 23.9 ml LV vol s, MOD A4C: 22.9 ml LV SV MOD A2C:     55.0 ml LV SV MOD A4C:     93.0 ml LV SV MOD BP:      63.6 ml RIGHT VENTRICLE RV Basal diam:  3.70 cm RV Mid diam:    2.90 cm RV S prime:     25.20 cm/s TAPSE (M-mode): 3.7 cm LEFT ATRIUM             Index       RIGHT ATRIUM           Index LA diam:        3.20 cm 1.39 cm/m  RA Area:     13.30 cm LA Vol (A2C):   65.6 ml 28.53 ml/m RA Volume:   31.10 ml  13.53 ml/m LA Vol (A4C):   59.8 ml 26.01 ml/m LA Biplane Vol: 63.6 ml 27.66 ml/m  AORTIC VALVE                    PULMONIC VALVE AV Area (Vmax):    3.19 cm     PV Vmax:       1.64 m/s AV Area (Vmean):   3.23 cm     PV Vmean:      113.000 cm/s AV Area (VTI):     3.77 cm     PV VTI:        0.256 m AV Vmax:           186.00 cm/s  PV Peak grad:  10.8 mmHg AV Vmean:          124.000 cm/s PV Mean grad:  6.0 mmHg AV VTI:            0.282 m AV Peak Grad:      13.8 mmHg AV Mean Grad:      7.0 mmHg LVOT Vmax:         143.00 cm/s LVOT Vmean:        96.300 cm/s LVOT VTI:          0.256 m LVOT/AV VTI ratio: 0.91  AORTA  Ao Root diam: 3.20 cm Ao Asc diam:  3.20 cm Ao Arch diam: 3.2 cm MITRAL VALVE                TRICUSPID VALVE MV Area (PHT): 4.63 cm     TR Peak grad:   25.4 mmHg MV Area VTI:   3.39 cm     TR Vmax:        252.00 cm/s MV Peak grad:  13.0 mmHg MV Mean grad:  4.0 mmHg     SHUNTS MV Vmax:       1.80 m/s     Systemic VTI:  0.26 m MV Vmean:       94.4 cm/s    Systemic Diam: 2.30 cm MV Decel Time: 164 msec MV E velocity: 74.30 cm/s MV A velocity: 106.00 cm/s MV E/A ratio:  0.70 Weston Brass MD Electronically signed by Weston Brass MD Signature Date/Time: 06/10/2020/11:44:08 AM    Final    CT HEAD CODE STROKE WO CONTRAST  Result Date: 06/09/2020 CLINICAL DATA:  Code stroke.  Rightward gaze.  Left arm weakness. EXAM: CT HEAD WITHOUT CONTRAST TECHNIQUE: Contiguous axial images were obtained from the base of the skull through the vertex without intravenous contrast. COMPARISON:  None. FINDINGS: Brain: An acute hemorrhage centered in the right thalamus measures 3.6 x 2.8 x 2.7 cm (approximate volume of 14 mL). There is intraventricular extension with a small amount of hemorrhage in the right greater than left lateral ventricles, third ventricle, and fourth ventricle. The ventricles are not grossly dilated, however prior imaging is not available to no the patient's baseline ventricle size. There is mild edema surrounding the right thalamic hemorrhage. There is 4 mm of leftward midline shift at the level of the thalami. Hypodensities in the cerebral white matter bilaterally are nonspecific but likely reflect mild chronic small vessel ischemic disease given the patient's vascular risk factors. No acute cortically based infarct or extra-axial fluid collection is evident. Vascular: No hyperdense vessel. Skull: No fracture or suspicious osseous lesion. Sinuses/Orbits: Minimal mucosal thickening in the right sphenoid sinus. Clear mastoid air cells. Left cataract extraction. Rightward gaze. Other: None. ASPECTS [facility] Stroke Program Early CT Score) Not scored in the presence of acute hemorrhage. IMPRESSION: 1. 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 2. 4 mm of leftward midline shift. 3. Mild chronic small vessel ischemic disease. These results were communicated to Dr. Wilford Corner at 4:17 pm on 06/09/2020 by text page via the [facility] messaging  system. Electronically Signed   By: Sebastian Ache M.D.   On: 06/09/2020 16:21   VAS US CAROTID  Result Date: 06/12/2020 Carotid Arterial Duplex Study Patient Name:  [name]  Date of Exam:   06/10/2020 Medical Rec #: [id number]       Accession #:    [id number] Date of Birth: [birth year]       Patient Gender: M Patient Age:   54Y Exam Location:  [facility] Procedure:      VAS US CAROTID Referring Phys: [id number] Marvel Plan --------------------------------------------------------------------------------  Indications:       CVA. Risk Factors:      Hypertension, hyperlipidemia. Limitations        Today's exam was limited due to the body habitus of the                    patient, the patient's respiratory variation, patient  movement, patient somnolence and a central line. Comparison Study:  No prior studies. Performing Technologist: Chanda Busing RVT  Examination Guidelines: A complete evaluation includes B-mode imaging, spectral Doppler, color Doppler, and power Doppler as needed of all accessible portions of each vessel. Bilateral testing is considered an integral part of a complete examination. Limited examinations for reoccurring indications may be performed as noted.  Right Carotid Findings: +----------+--------+--------+--------+-----------------------+--------+           PSV cm/sEDV cm/sStenosisPlaque Description     Comments +----------+--------+--------+--------+-----------------------+--------+ CCA Prox  101     12                                              +----------+--------+--------+--------+-----------------------+--------+ CCA Distal82      11              smooth and heterogenous         +----------+--------+--------+--------+-----------------------+--------+ ICA Prox  58      18              smooth and heterogenous         +----------+--------+--------+--------+-----------------------+--------+ ICA Distal57      17                                      tortuous +----------+--------+--------+--------+-----------------------+--------+ ECA       136     19                                              +----------+--------+--------+--------+-----------------------+--------+ +----------+--------+-------+--------+-------------------+           PSV cm/sEDV cmsDescribeArm Pressure (mmHG) +----------+--------+-------+--------+-------------------+ Subclavian120                                        +----------+--------+-------+--------+-------------------+ +---------+--------+--+--------+--+---------+ VertebralPSV cm/s56EDV cm/s13Antegrade +---------+--------+--+--------+--+---------+  Left Carotid Findings: +----------+--------+--------+--------+-----------------------+--------+           PSV cm/sEDV cm/sStenosisPlaque Description     Comments +----------+--------+--------+--------+-----------------------+--------+ CCA Prox  58      8               smooth and heterogenous         +----------+--------+--------+--------+-----------------------+--------+ CCA Distal69      11              smooth and heterogenous         +----------+--------+--------+--------+-----------------------+--------+ ICA Prox  69      20              smooth and heterogenoustortuous +----------+--------+--------+--------+-----------------------+--------+ ICA Distal112     34                                     tortuous +----------+--------+--------+--------+-----------------------+--------+ ECA       99      14                                              +----------+--------+--------+--------+-----------------------+--------+ +----------+--------+--------+--------+-------------------+  PSV cm/sEDV cm/sDescribeArm Pressure (mmHG) +----------+--------+--------+--------+-------------------+ [id number]                                         +----------+--------+--------+--------+-------------------+  +---------+--------+--+--------+--+---------+ VertebralPSV cm/s41EDV cm/s10Antegrade +---------+--------+--+--------+--+---------+   Summary: Right Carotid: Velocities in the right ICA are consistent with a 1-39% stenosis. Left Carotid: Velocities in the left ICA are consistent with a 1-39% stenosis. Vertebrals: Bilateral vertebral arteries demonstrate antegrade flow. *See table(s) above for measurements and observations.  Electronically signed by Delia Heady MD on 06/12/2020 at 2:03:49 PM.    Final    [place] EKG SITE RITE  Result Date: 06/09/2020 If Site Rite image not attached, placement could not be confirmed due to current cardiac rhythm.   Microbiology: No results found for this or any previous visit (from the past 240 hour(s)).   Labs: Basic Metabolic Panel: Recent Labs  Lab 06/18/20 1823 06/19/20 0552 06/20/20 0133 06/21/20 0050 06/22/20 0311  NA 128* 132* 133* 134* 136  K 5.4* 4.4 4.4 5.7* 3.6  CL 95* 98 101 101 100  CO2 23 24 24 24 27   GLUCOSE 117* 84 99 107* 103*  BUN 38* 38* 32* 23* 18  CREATININE 1.92* 1.41* 1.38* 1.19 1.32*  CALCIUM 9.5 9.6 9.3 9.5 9.4  MG 2.6*  --   --   --   --   PHOS 5.6*  --   --   --   --    Liver Function Tests: Recent Labs  Lab 06/18/20 1823  AST 25  ALT 31  ALKPHOS 54  BILITOT 1.1  PROT 7.5  ALBUMIN 3.7   No results for input(s): LIPASE, AMYLASE in the last 168 hours. Recent Labs  Lab 06/18/20 1823  AMMONIA 23   CBC: Recent Labs  Lab 06/18/20 1823 06/20/20 0133 06/21/20 0050 06/22/20 0311  WBC 9.0 7.0 7.3 6.8  NEUTROABS 7.3  --   --   --   HGB 12.8* 12.4* 13.2 12.2*  HCT 37.9* 37.8* 39.2 36.9*  MCV 91.8 93.8 92.5 93.9  PLT 488* 466* 446* 509*   Cardiac Enzymes: No results for input(s): CKTOTAL, CKMB, CKMBINDEX, TROPONINI in the last 168 hours. BNP: BNP (last 3 results) No results for input(s): BNP in the last 8760 hours.  ProBNP (last 3 results) No results for input(s): PROBNP in the last 8760  hours.  CBG: Recent Labs  Lab 06/18/20 1615 06/19/20 1709  GLUCAP 148* 97       Signed:  Brahim Dolman  [facility] 06/22/2020, 10:25 AM

## 2020-06-22 NOTE — H&P (Signed)
Physical Medicine and Rehabilitation Admission H&P        Chief Complaint  Patient presents with  .  Stroke with functional deficits.      HPI: [name] is a 55 year old RH male with history of obesity, HTN, and ICM, chronic combined systolic/diastolic CHF who was admitted on 06/08/2020 after wife found patient in bathroom with right gaze preference, right-sided weakness and right-sided numbness.  Blood pressure is elevated at 200-230s and patient had complaints of headache.  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  He was started on Cleviprex and had increasing lethargy with snoring requiring nonrebreather mask in ED.  He was started on hypertonic saline and follow-up MR MRA was negative for vascular malformation, occlusion or significant stenosis. MRI brain showed no substantial change in size of hemorrhage and stable 4 mm leftward shift.  Carotid Dopplers were negative for significant ICA stenosis.  2D echo done showing EF 65 to 70% with no significant wall abnormality and severe LVH.  Serial CT head done for monitoring and showed partial clearing of intraventricular hemorrhage with mild progressive ventricular enlargement without frank hydrocephalus.  He was weaned off hypertonic saline as well as Cleviprex and blood pressure control has improved with addition of multiple medications.   He has had issues with bouts of agitation and was treated with CIWA protocol.  Seroquel added to help manage delirium.  Swallow evaluation completed and patient placed on dysphagia 2 with nectar liquids initially.  He continued to have issues with significant headache and valproic acid added.  On 05/27, patient was found in bed lethargic with minimal responsiveness, had neck jerking movements to the left with left gaze deviation jerking to the left with left gaze deviation.  Repeat CT of head was stable.  LT-EEG done showing slowing generalized lateralization to  right hemisphere without definite epileptiform discharges.  Low-dose Keppra was added for likely partial complex seizure and he was admitted to ICU briefly on 05/27 for closer monitoring due to decreasing in neurological status.  He has had issues with hyponatremia as well as hyperkalemia requiring a dose of Kayexalate.  Acute renal failure is resolving.  As mentation back to baseline, he was transferred back to regular floor.  Diet has been advanced to dysphagia 3 thin liquids.  He continues to be limited by fatigue, dense left hemiplegia with right gaze preference with difficulty tracking to left lateral field.  Therapy has been ongoing and patient working on attempts at standing with left knee blocked.  CIR was recommended due to functional decline.  Was admitted   Review of Systems  Constitutional: Negative for chills and fever.  HENT: Negative for hearing loss and tinnitus.   Eyes: Positive for blurred vision.  Respiratory: Positive for shortness of breath (wtih all activity).   Cardiovascular: Positive for chest pain (with certain activity/stress exertion.).  Gastrointestinal: Negative for constipation, heartburn and nausea.  Genitourinary: Negative for dysuria and urgency.       Gets up every hour at night  Musculoskeletal: Negative for myalgias and neck pain.  Skin: Negative for rash.  Neurological: Positive for sensory change, speech change, weakness and headaches (constant).  Psychiatric/Behavioral: The patient has insomnia.           Past Medical History:  Diagnosis Date  . Chronic combined systolic and diastolic CHF (congestive heart failure) (HCC)      EF 25-30%  . Diastolic dysfunction    . Dyslipidemia    .  Hyperlipidemia    . Hypertension    . Nonischemic dilated cardiomyopathy (HCC)      felt secondary to HTN with no ischemia on nuclear stress test 05/2012, EF 25-30%  . Shortness of breath             Past Surgical History:  Procedure Laterality Date  . KNEE  ARTHROSCOPY Left 04/13/2016    Procedure: ARTHROSCOPY KNEE WITH DEBRIDEMENT;  Surgeon: Valeria Batman, MD;  Location: [facility];  Service: Orthopedics;  Laterality: Left;  . KNEE ARTHROSCOPY WITH MEDIAL MENISECTOMY Right 04/13/2016    Procedure: KNEE ARTHROSCOPY WITH MEDIAL MENISECTOMY;  Surgeon: Valeria Batman, MD;  Location: [facility];  Service: Orthopedics;  Laterality: Right;  . NASAL FRACTURE SURGERY               Family History  Problem Relation Age of Onset  . Hypertension Mother          Passed away when she is 59 secondary to hip surgery complications  . CAD Mother    . Heart attack Mother    . Hypertension Father    . Heart attack Maternal Grandmother        Social History:  Married. 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 He reports that he has never smoked. He has never used smokeless tobacco. He reports current alcohol use of about has 9 cans of beer day and 7 shots of gin/vodka daily. He reports that he does not use drugs.      Allergies: No Known Allergies            Medications Prior to Admission  Medication Sig Dispense Refill  . atorvastatin (LIPITOR) 40 MG tablet Take 1 tablet (40 mg total) by mouth daily at 6 PM. 90 tablet 1  . hydrochlorothiazide (HYDRODIURIL) 12.5 MG tablet Take 1 tablet (12.5 mg total) by mouth every other day. 90 tablet 1  . ibuprofen (ADVIL) 200 MG tablet Take 800 mg by mouth every 6 (six) hours as needed for moderate pain.      . metoprolol tartrate (LOPRESSOR) 100 MG tablet Take 1 tablet (100 mg total) by mouth 2 (two) times daily. 180 tablet 1  . Multiple Vitamin (MULTIVITAMIN WITH MINERALS) TABS tablet Take 1 tablet by mouth daily.      Marland Kitchen losartan (COZAAR) 50 MG tablet Take 1 tablet (50 mg total) by mouth daily. 90 tablet 1  . predniSONE (DELTASONE) 10 MG tablet 6 tabs po day 1, 5 tabs po day 2, 4 tabs po day 3, 3 tabs po day 4, 2 tabs po day 5, 1 tab po day 6 (Patient not taking: No sig reported) 21  tablet 0      Drug Regimen Review  Drug regimen was reviewed and remains appropriate with no significant issues identified   Home: Home Living Family/patient expects to be discharged to:: Private residence Living Arrangements: Spouse/significant other Available Help at Discharge: Family,Available 24 hours/day Type of Home: House Home Access: Stairs to enter [employer] of Steps: 1 Entrance Stairs-Rails: Left Home Layout: One level Bathroom Shower/Tub: Engineer, manufacturing systems: Standard Bathroom Accessibility: Yes Home Equipment: None  Lives With: Spouse   Functional History: Prior Function Level of Independence: Independent Comments: Pt drives a forklift.   Functional Status:  Mobility: Bed Mobility Overal bed mobility: Needs Assistance Bed Mobility: Supine to Sit Rolling: Min assist,Min guard Sidelying to sit: Mod assist Supine to sit: Mod assist,+2 for physical assistance Sit to  supine: Total assist,+2 for physical assistance,+2 for safety/equipment General bed mobility comments: physical assist with cues to hook RLE under LLE to pivot legs to EOB and mod +2 assist to lift trunk from surface toward right side of bed with mod cues to push into bed with right elbow Transfers Overall transfer level: Needs assistance Equipment used: Ambulation equipment used Transfer via Lift Equipment: Stedy Transfers: Sit to/from Stand Sit to Stand: Colgate-PalmoliveMin assist,+2 physical assistance Squat pivot transfers: +2 physical assistance,Total assist General transfer comment: with left knee blocked and bil UE hooked with PT/tech pt able to stand EOB and achieve fully upright with cues and assist for weight shifting to midine grossly 30 sec. [facility] chair Ambulation/Gait General Gait Details: not yet able   ADL: ADL Overall ADL's : Needs assistance/impaired Eating/Feeding: Set up,Sitting (will further assess; unsure if pt attending to full  tray) Grooming: Minimal assistance,Sitting,Maximal assistance Grooming Details (indicate cue type and reason): Pt requiring assistance to maintain grasp on tooth paste with L hand. Also poor sitting balance when performing oral care and needing Max A for support at EOB Upper Body Bathing: Moderate assistance,Bed level Lower Body Bathing: Maximal assistance,Bed level Upper Body Dressing : Moderate assistance,Bed level Lower Body Dressing: Total assistance Toilet Transfer: Minimal assistance,+2 for physical assistance,Total assistance (sit<>stand wit stedy and then transfer to recliner) Toilet Transfer Details (indicate cue type and reason): Min A +2 for sit<>stand with stedy adn then transfer to recliner Toileting- Clothing Manipulation and Hygiene: Total assistance Functional mobility during ADLs: +2 for physical assistance,Minimal assistance (sit <> stand) General ADL Comments: Highly motivated. Decreased strength, balance, attention to L, and cognition   Cognition: Cognition Overall Cognitive Status: Impaired/Different from baseline Arousal/Alertness: Awake/alert Orientation Level: Oriented X4 Attention: Sustained Sustained Attention: Impaired Sustained Attention Impairment: Verbal basic Memory: Impaired Memory Impairment: Retrieval deficit Awareness: Impaired Awareness Impairment: Emergent impairment,Anticipatory impairment Problem Solving: Impaired Problem Solving Impairment: Verbal basic Executive Function: Sequencing,Organizing Behaviors: Impulsive Safety/Judgment: Impaired Cognition Arousal/Alertness: Awake/alert Behavior During Therapy: WFL for tasks assessed/performed Overall Cognitive Status: Impaired/Different from baseline Area of Impairment: Attention,Following commands,Safety/judgement,Awareness,Problem solving Current Attention Level: Sustained Memory: Decreased short-term memory Following Commands: Follows one step commands with increased time,Follows multi-step  commands inconsistently Safety/Judgement: Decreased awareness of safety,Decreased awareness of deficits Awareness: Intellectual,Emergent Problem Solving: Slow processing,Requires tactile cues,Requires verbal cues General Comments: pt awake with STM deficits needing redirection and repetition of tasks. pt with poor awareness of balance and strength deficits   Blood pressure (!) 151/87, pulse 79, temperature 98.7 F (37.1 C), temperature source Oral, resp. rate 18, height 5\' 10"  (1.778 m), weight 106.3 kg, SpO2 98 %. Physical Exam Vitals and nursing note reviewed.  Constitutional:      General: He is not in acute distress.    Appearance: He is obese. He is not ill-appearing.     Comments: Tends to keep eyes closed and falls asleep easily.   HENT:     Head: Normocephalic and atraumatic.     Right Ear: External ear normal.     Left Ear: External ear normal.     Nose: Nose normal.     Mouth/Throat:     Mouth: Mucous membranes are moist.  Eyes:     Extraocular Movements: Extraocular movements intact.     Pupils: Pupils are equal, round, and reactive to light.  Cardiovascular:     Rate and Rhythm: Normal rate and regular rhythm.     Heart sounds: No murmur heard. No gallop.  Pulmonary:     Effort: Pulmonary effort is normal. No respiratory distress.     Breath sounds: No wheezing.  Abdominal:     General: Bowel sounds are normal. There is no distension.     Tenderness: There is no abdominal tenderness.  Musculoskeletal:        General: No tenderness.     Cervical back: Normal range of motion and neck supple.     Comments: Mild edema LUE. Trace LLE also  Skin:    General: Skin is warm.  Neurological:     Mental Status: He is oriented to person, place, and time.     Comments: Pt is alert and oriented x 3. Left central 7 with dysarthria. Normal language. Inattentive to left, left eye lags during visual tracking (chronic). Visual fields appear to be grossly intact. LUE 0/5, LLE 1/5 HE  but otherwise 0 to tr/5. RUE and RLE 5/5. Mild sensory changes LUE and LLE to LT and pain. DTR's 1+ throughout.   Psychiatric:        Mood and Affect: Mood normal.        Behavior: Behavior normal.        Thought Content: Thought content normal.        Judgment: Judgment normal.        Lab Results Last 48 Hours        Results for orders placed or performed during the hospital encounter of 06/09/20 (from the past 48 hour(s))  Glucose, capillary     Status: None    Collection Time: 06/19/20  5:09 PM  Result Value Ref Range    Glucose-Capillary 97 70 - 99 mg/dL      Comment: Glucose reference range applies only to samples taken after fasting for at least 8 hours.  CBC     Status: Abnormal    Collection Time: 06/20/20  1:33 AM  Result Value Ref Range    WBC 7.0 4.0 - 10.5 K/uL    RBC 4.03 (L) 4.22 - 5.81 MIL/uL    Hemoglobin 12.4 (L) 13.0 - 17.0 g/dL    HCT 10.9 (L) 32.3 - 52.0 %    MCV 93.8 80.0 - 100.0 fL    MCH 30.8 26.0 - 34.0 pg    MCHC 32.8 30.0 - 36.0 g/dL    RDW 55.7 32.2 - 02.5 %    Platelets 466 (H) 150 - 400 K/uL    nRBC 0.0 0.0 - 0.2 %      Comment: Performed at [facility], [address] [place], [place] [address]  Basic metabolic panel     Status: Abnormal    Collection Time: 06/20/20  1:33 AM  Result Value Ref Range    Sodium 133 (L) 135 - 145 mmol/L    Potassium 4.4 3.5 - 5.1 mmol/L    Chloride 101 98 - 111 mmol/L    CO2 24 22 - 32 mmol/L    Glucose, Bld 99 70 - 99 mg/dL      Comment: Glucose reference range applies only to samples taken after fasting for at least 8 hours.    BUN 32 (H) 6 - 20 mg/dL    Creatinine, Ser 2.37 (H) 0.61 - 1.24 mg/dL    Calcium 9.3 8.9 - 62.8 mg/dL    GFR, Estimated >31 >51 mL/min      Comment: (NOTE) Calculated using the CKD-EPI Creatinine Equation (2021)      Anion gap 8 5 - 15  Comment: Performed at [facility], [address], [place], [place] [address]  CBC     Status: Abnormal    Collection Time:  06/21/20 12:50 AM  Result Value Ref Range    WBC 7.3 4.0 - 10.5 K/uL    RBC 4.24 4.22 - 5.81 MIL/uL    Hemoglobin 13.2 13.0 - 17.0 g/dL    HCT 58.5 27.7 - 82.4 %    MCV 92.5 80.0 - 100.0 fL    MCH 31.1 26.0 - 34.0 pg    MCHC 33.7 30.0 - 36.0 g/dL    RDW 23.5 36.1 - 44.3 %    Platelets 446 (H) 150 - 400 K/uL    nRBC 0.0 0.0 - 0.2 %      Comment: Performed at [facility], [address], [place], [place] [address]  Basic metabolic panel     Status: Abnormal    Collection Time: 06/21/20 12:50 AM  Result Value Ref Range    Sodium 134 (L) 135 - 145 mmol/L    Potassium 5.7 (H) 3.5 - 5.1 mmol/L      Comment: DELTA CHECK NOTED SLIGHT HEMOLYSIS      Chloride 101 98 - 111 mmol/L    CO2 24 22 - 32 mmol/L    Glucose, Bld 107 (H) 70 - 99 mg/dL      Comment: Glucose reference range applies only to samples taken after fasting for at least 8 hours.    BUN 23 (H) 6 - 20 mg/dL    Creatinine, Ser 8.67 0.61 - 1.24 mg/dL    Calcium 9.5 8.9 - 61.9 mg/dL    GFR, Estimated >50 >93 mL/min      Comment: (NOTE) Calculated using the CKD-EPI Creatinine Equation (2021)      Anion gap 9 5 - 15      Comment: Performed at [facility], [address], [place], [place] [address]       Imaging Results (Last 48 hours)  CT HEAD WO CONTRAST   Result Date: 06/19/2020 CLINICAL DATA:  History of right thalamic hemorrhage EXAM: CT HEAD WITHOUT CONTRAST TECHNIQUE: Contiguous axial images were obtained from the base of the skull through the vertex without intravenous contrast. COMPARISON:  06/18/2020 FINDINGS: Brain: The known right thalamic hemorrhage seen on prior study is grossly unchanged on this exam. There is mild increased surrounding edema, with persistent mass effect upon the third ventricle. Stable mild ventriculomegaly with minimal blood products layering dependently within the occipital horns of the lateral ventricles. No frank hydrocephalus at this time. No acute extra-axial fluid collections.  Vascular: No hyperdense vessel or unexpected calcification. Skull: Normal. Negative for fracture or focal lesion. Sinuses/Orbits: No acute finding. Other: None. IMPRESSION: 1. Stable size of the right thalamic hemorrhage, with minimal increase in surrounding edema. 2. Stable mass effect upon the third ventricle, with stable mild dilation of the lateral ventricles without frank hydrocephalus. 3. Stable decompression of the thalamic hemorrhage into the ventricular system, with minimal blood products layering dependently within the occipital horns as before. Electronically Signed   By: Sharlet Salina M.D.   On: 06/19/2020 21:43             Medical Problem List and Plan: 1.  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 Course complicated by seizure             -patient may  shower             -  ELOS/Goals: 21-23 days, mod assist with PT and OT and supervision with SLP 2.  Antithrombotics: -DVT/anticoagulation:  Pharmaceutical: Lovenox             -antiplatelet therapy: N/AA due to bleed 3.  Ongoing headaches/pain Management: Continue Depakote and Keppra. 4. Mood: LCSW to follow for evaluation and support             -antipsychotic agents: N/AA 5. Neuropsych: This patient is intermittently capable of making decisions on his own behalf. 6. Skin/Wound Care: Routine pressure-relief measures.. 7. Fluids/Electrolytes/Nutrition: Monitor intake/output.  Check CMET in AM.             -appetite reasonable at present. 9.  Partial complex seizure: Continue Keppra 500 mg twice daily. 10.  HTN: Monitor blood pressures 3 times daily.              -- Continue amlodipine, labetalol, losartan and clonidine             -SBP goal of <160 given ICH 11.  Dyslipidemia: On statin. 12.  Acute renal failure: SCr improving from 1.16-->1.92-->1.32.              -- Monitor with routine checks as question worsening due to hypoperfusion with improved BP  control. 13.  History of alcohol abuse/delirium: Continue Seroquel 25 mg at bedtime to help with sleep/wake cycle             -- Continue folic acid and thiamine supplementation 14.  Acute blood loss anemia: Will monitor with serial CBC.  H&H relatively stable in 12-13 range over the last few days. 15.  Morbid obesity/OHS/OSA?: Outpatient sleep study recommended for work-up         Jacquelynn Cree, PA-C 06/21/2020  I have personally performed a face to face diagnostic evaluation of this patient and formulated the key components of the plan.  Additionally, I have personally reviewed laboratory data, imaging studies, as well as relevant notes and concur with the physician assistant's documentation above.  The patient's status has not changed from the original H&P.  Any changes in documentation from the acute care chart have been noted above.  Ranelle Oyster, MD, [place]

## 2020-06-22 NOTE — Progress Notes (Signed)
Patient arrived on unit, oriented to unit. Reviewed medications, therapy schedule, rehab routine and plan of care. States an understanding of information reviewed. No complications noted at this time. Patient reports no pain and is AX3 [name] L Bita Cartwright  

## 2020-06-22 NOTE — Progress Notes (Signed)
Inpatient Rehabilitation Medication Review by a Pharmacist  A complete drug regimen review was completed for this patient to identify any potential clinically significant medication issues.  Clinically significant medication issues were identified:  no  Check AMION for pharmacist assigned to patient if future medication questions/issues arise during this admission.  Pharmacist comments:   Time spent performing this drug regimen review (minutes):  5-61min   Juliah Scadden S. Merilynn Finland, PharmD, BCPS Clinical Staff Pharmacist [email or web address] Pasty Spillers 06/22/2020 3:06 PM

## 2020-06-22 NOTE — Progress Notes (Signed)
Chaplain was on Consult list for AD. Chaplain carried AD to patient's room and wife was bedside. He told her that he requested it. Chaplain sat down with patient and wife and went over the forms. Wife said that she would go over the living will tonight with patient and help him fill it out and also the medical power of attorney. Chaplain gave them the number for the Chaplains and asked them to call whenever patient is ready to sign. Times for the Notary are 2-5PM M-F. Patient put phone number into his phone. Chaplain is available when needed.    06/22/20 1800  Clinical Encounter Type  Visited With Patient and family together  Visit Type Initial  Referral From Nurse  Consult/Referral To Chaplain

## 2020-06-22 NOTE — Progress Notes (Signed)
Orthopedic Tech Progress Note Patient Details:  [name] 1965/10/26 [id number] Called in order to [facility] for a REHAB COMBO Patient ID: [name], male   DOB: 01-Feb-1965, 55 y.o.   MRN: [id number]   Donald Pore 06/22/2020, 3:08 PM

## 2020-06-23 DIAGNOSIS — D62 Acute posthemorrhagic anemia: Secondary | ICD-10-CM

## 2020-06-23 DIAGNOSIS — I10 Essential (primary) hypertension: Secondary | ICD-10-CM

## 2020-06-23 DIAGNOSIS — N179 Acute kidney failure, unspecified: Secondary | ICD-10-CM

## 2020-06-23 DIAGNOSIS — R569 Unspecified convulsions: Secondary | ICD-10-CM

## 2020-06-23 LAB — CBC WITH DIFFERENTIAL/PLATELET
Abs Immature Granulocytes: 0.05 10*3/uL (ref 0.00–0.07)
Basophils Absolute: 0 10*3/uL (ref 0.0–0.1)
Basophils Relative: 1 %
Eosinophils Absolute: 0.2 10*3/uL (ref 0.0–0.5)
Eosinophils Relative: 2 %
HCT: 36.2 % — ABNORMAL LOW (ref 39.0–52.0)
Hemoglobin: 11.8 g/dL — ABNORMAL LOW (ref 13.0–17.0)
Immature Granulocytes: 1 %
Lymphocytes Relative: 29 %
Lymphs Abs: 1.9 10*3/uL (ref 0.7–4.0)
MCH: 31 pg (ref 26.0–34.0)
MCHC: 32.6 g/dL (ref 30.0–36.0)
MCV: 95 fL (ref 80.0–100.0)
Monocytes Absolute: 0.9 10*3/uL (ref 0.1–1.0)
Monocytes Relative: 14 %
Neutro Abs: 3.5 10*3/uL (ref 1.7–7.7)
Neutrophils Relative %: 53 %
Platelets: 516 10*3/uL — ABNORMAL HIGH (ref 150–400)
RBC: 3.81 MIL/uL — ABNORMAL LOW (ref 4.22–5.81)
RDW: 11.7 % (ref 11.5–15.5)
WBC: 6.5 10*3/uL (ref 4.0–10.5)
nRBC: 0 % (ref 0.0–0.2)

## 2020-06-23 LAB — COMPREHENSIVE METABOLIC PANEL
ALT: 36 U/L (ref 0–44)
AST: 28 U/L (ref 15–41)
Albumin: 3.4 g/dL — ABNORMAL LOW (ref 3.5–5.0)
Alkaline Phosphatase: 57 U/L (ref 38–126)
Anion gap: 9 (ref 5–15)
BUN: 14 mg/dL (ref 6–20)
CO2: 29 mmol/L (ref 22–32)
Calcium: 9.7 mg/dL (ref 8.9–10.3)
Chloride: 99 mmol/L (ref 98–111)
Creatinine, Ser: 1.26 mg/dL — ABNORMAL HIGH (ref 0.61–1.24)
GFR, Estimated: >60 mL/min (ref >60)
Glucose, Bld: 93 mg/dL (ref 70–99)
Potassium: 3.8 mmol/L (ref 3.5–5.1)
Sodium: 137 mmol/L (ref 135–145)
Total Bilirubin: 0.8 mg/dL (ref 0.3–1.2)
Total Protein: 6.8 g/dL (ref 6.5–8.1)

## 2020-06-23 MED ORDER — ONDANSETRON HCL 4 MG PO TABS
2.0000 mg | ORAL_TABLET | Freq: Three times a day (TID) | ORAL | Status: DC | PRN
  Administered 2020-06-23: 2 mg via ORAL
  Filled 2020-06-23: qty 1

## 2020-06-23 NOTE — Evaluation (Signed)
Speech Language Pathology Assessment and Plan  Patient Details  Name: [name] MRN: [id number] Date of Birth: 01-16-1966  SLP Diagnosis: Cognitive Impairments;Dysphagia  Rehab Potential: Excellent ELOS: 25-30 days    Today's Date: 06/23/2020 SLP Individual Time:0900-1000 Time Calculation: 73 min   Hospital Problem: Principal Problem:   Thalamic hemorrhage ([place]) Active Problems:   Acute blood loss anemia   AKI (acute kidney injury) ([place])   Essential hypertension   Seizures ([place])  Past Medical History:  Past Medical History:  Diagnosis Date  . Chronic combined systolic and diastolic CHF (congestive heart failure) (HCC)    EF 25-30%  . Diastolic dysfunction   . Dyslipidemia   . Hyperlipidemia   . Hypertension   . Nonischemic dilated cardiomyopathy ([place])    felt secondary to HTN with no ischemia on nuclear stress test 05/2012, EF 25-30%  . Shortness of breath    Past Surgical History:  Past Surgical History:  Procedure Laterality Date  . KNEE ARTHROSCOPY Left 04/13/2016   Procedure: ARTHROSCOPY KNEE WITH DEBRIDEMENT;  Surgeon: Garald Balding, MD;  Location: [facility];  Service: Orthopedics;  Laterality: Left;  . KNEE ARTHROSCOPY WITH MEDIAL MENISECTOMY Right 04/13/2016   Procedure: KNEE ARTHROSCOPY WITH MEDIAL MENISECTOMY;  Surgeon: Garald Balding, MD;  Location: [facility];  Service: Orthopedics;  Laterality: Right;  . NASAL FRACTURE SURGERY      Assessment / Plan / Recommendation Clinical Impression Patient is a 28 year oldmalewith past medical history of hypertension, obesity, cardiomyopathy, hyperlipidemia, diastolic congestive heart failure.Presented 06/09/2020 with acute onset of left-sided weakness and right gaze. Cranial CT scan showed acute right thalamic hemorrhage with intraventricular extension 4 mm leftward midline shift. Echocardiogram with ejection fraction of 65 to 70% no wall motion abnormalities.  Episodes of  confusion and anxiety treated for alcohol withdrawal with Ativan. ON 5/25 overnight with visual and auditory hallucinations. A repeat CT scan felt stable and improving. On 5/28 transferred to ICU after a seizure episode with left gaze ad left neck turning with jerking followed by postictal. Therapy evaluations completed with recommendations for CIR. Patient admitted 06/22/20.  Patient demonstrates moderate cognitive impairmenst impacting sustained attention, visual scanning/attention to left field of environment, intellectual awareness, functional problem solving and recall. Patient was administered the Cognistat and scored mild impairments in orientation and visuospatial tasks and severe impairments in short-term recall. SLP also administered a BSE with mild left anterior spillage noted with both solid and liquid textures with mild left buccal pocketing with solid textures that cleared with a liquid wash. No overt s/s of aspiration noted. Recommend patient continue current diet with full supervision to maximize use of swallowing compensatory strategies. Patient would benefit from skilled SLP intervention to maximize his swallowing and cognitive functioning prior to discharge.     Skilled Therapeutic Interventions          Administered a cognitive-linguistic evaluation and BSE, please see above for details.   SLP Assessment  Patient will need skilled Speech Lanaguage Pathology Services during CIR admission    Recommendations  SLP Diet Recommendations: Dysphagia 3 (Mech soft);Thin Liquid Administration via: Cup;Straw Medication Administration: Whole meds with liquid Supervision: Patient able to self feed, Full supervision with meals Compensations: Slow rate;Small sips/bites;Lingual sweep for clearance of pocketing;Minimize environmental distractions Postural Changes and/or Swallow Maneuvers: Seated upright 90 degrees Oral Care Recommendations: Oral care BID Recommendations for Other Services:  Neuropsych consult Patient destination: Home Follow up Recommendations: 24 hour supervision/assistance;Outpatient SLP;Home Health SLP Equipment Recommended: None  recommended by SLP    SLP Frequency 3 to 5 out of 7 days   SLP Duration  SLP Intensity  SLP Treatment/Interventions 25-30 days  Minumum of 1-2 x/day, 30 to 90 minutes  Cognitive remediation/compensation;Internal/external aids;Dysphagia/aspiration precaution training;Therapeutic Activities;Environmental controls;Cueing hierarchy;Functional tasks;Patient/family education    Pain Pain Assessment Pain Score: 0-No pain  Prior Functioning Type of Home: House  Lives With: Spouse Available Help at Discharge: Family;Available PRN/intermittently Vocation: Full time employment  Care Tool Care Tool Cognition Expression of Ideas and Wants Expression of Ideas and Wants: Some difficulty - exhibits some difficulty with expressing needs and ideas (e.g, some words or finishing thoughts) or speech is not clear   Understanding Verbal and Non-Verbal Content Understanding Verbal and Non-Verbal Content: Usually understands - understands most conversations, but misses some part/intent of message. Requires cues at times to understand   Memory/Recall Ability *first 3 days only Memory/Recall Ability *first 3 days only: That he or she is in a hospital/hospital unit;Current season     Short Term Goals: Week 1: SLP Short Term Goal 1 (Week 1): Patient will consume current diet with minimal overt s/s of aspiration with Supervision verbal cues for use of swallowing compensatory strategies. SLP Short Term Goal 2 (Week 1): Patient will demonstrate efficient mastication with minimal oral residue without overt s/s of aspiration with supervision level verbal cues over 2 sessions prior to upgrade. SLP Short Term Goal 3 (Week 1): Patient will recall new, daily information with Mod verbal and visual cues. SLP Short Term Goal 4 (Week 1): Patient will  demonstrate sustained attention to functional tasks for 15 minutes with Min verbal cues for redirection. SLP Short Term Goal 5 (Week 1): Patient will demonstrate functional problem solving for basic and familiar tasks with Min verbal cues. SLP Short Term Goal 6 (Week 1): Patient will identify 2 physical and 2 cognitive changes with Min verbal cues.  Refer to Care Plan for Long Term Goals  Recommendations for other services: Neuropsych  Discharge Criteria: Patient will be discharged from SLP if patient refuses treatment 3 consecutive times without medical reason, if treatment goals not met, if there is a change in medical status, if patient makes no progress towards goals or if patient is discharged from hospital.  The above assessment, treatment plan, treatment alternatives and goals were discussed and mutually agreed upon: by patient  Adore Kithcart 06/23/2020, 4:06 PM

## 2020-06-23 NOTE — Evaluation (Signed)
Occupational Therapy Assessment and Plan  Patient Details  Name: [name] MRN: [id number] Date of Birth: [birth year]  OT Diagnosis: cognitive deficits, disturbance of vision and hemiplegia affecting non-dominant side Rehab Potential: Rehab Potential (ACUTE ONLY): Good ELOS: 25 - 30 days   Today's Date: 06/23/2020 OT Individual Time: 1000-1100 OT Individual Time Calculation (min): 60 min     Hospital Problem: Principal Problem:   Thalamic hemorrhage ([place]) Active Problems:   Acute blood loss anemia   AKI (acute kidney injury) ([place])   Essential hypertension   Seizures ([place])   Past Medical History:  Past Medical History:  Diagnosis Date  . Chronic combined systolic and diastolic CHF (congestive heart failure) (HCC)    EF 25-30%  . Diastolic dysfunction   . Dyslipidemia   . Hyperlipidemia   . Hypertension   . Nonischemic dilated cardiomyopathy ([place])    felt secondary to HTN with no ischemia on nuclear stress test 05/2012, EF 25-30%  . Shortness of breath    Past Surgical History:  Past Surgical History:  Procedure Laterality Date  . KNEE ARTHROSCOPY Left 04/13/2016   Procedure: ARTHROSCOPY KNEE WITH DEBRIDEMENT;  Surgeon: Garald Balding, MD;  Location: [facility];  Service: Orthopedics;  Laterality: Left;  . KNEE ARTHROSCOPY WITH MEDIAL MENISECTOMY Right 04/13/2016   Procedure: KNEE ARTHROSCOPY WITH MEDIAL MENISECTOMY;  Surgeon: Garald Balding, MD;  Location: [facility];  Service: Orthopedics;  Laterality: Right;  . NASAL FRACTURE SURGERY      Assessment & Plan Clinical Impression:  [name] is a 55 year old RH male with history of obesity, HTN, and ICM, chronic combined systolic/diastolic CHF who was admitted on 06/08/2020 after wife found patient in bathroom with right gaze preference, right-sided weakness and right-sided numbness.  Blood pressure is elevated at 200-230s and patient had complaints of headache.  CT head showed acute  right thalamic hemorrhage with extension into ventricles.  He was started on Cleviprex and had increasing lethargy with snoring requiring nonrebreather mask in ED.  He was started on hypertonic saline and follow-up MR MRA was negative for vascular malformation, occlusion or significant stenosis. MRI brain showed no substantial change in size of hemorrhage and stable 4 mm leftward shift.  Carotid Dopplers were negative for significant ICA stenosis.  2D echo done showing EF 65 to 70% with no significant wall abnormality and severe LVH.  Serial CT head done for monitoring and showed partial clearing of intraventricular hemorrhage with mild progressive ventricular enlargement without frank hydrocephalus.  He was weaned off hypertonic saline as well as Cleviprex and blood pressure control has improved with addition of multiple medications.   He has had issues with bouts of agitation and was treated with CIWA protocol.  Seroquel added to help manage delirium.  Swallow evaluation completed and patient placed on dysphagia 2 with nectar liquids initially.  He continued to have issues with significant headache and valproic acid added.  On 05/27, patient was found in bed lethargic with minimal responsiveness, had neck jerking movements to the left with left gaze deviation jerking to the left with left gaze deviation.  Repeat CT of head was stable.  LT-EEG done showing slowing generalized lateralization to right hemisphere without definite epileptiform discharges.  Low-dose Keppra was added for likely partial complex seizure and he was admitted to ICU briefly on 05/27 for closer monitoring due to decreasing in neurological status.  He has had issues with hyponatremia as well as hyperkalemia requiring a dose  of Kayexalate.  Acute renal failure is resolving.  As mentation back to baseline, he was transferred back to regular floor.  Diet has been advanced to dysphagia 3 thin liquids.  He continues to be limited by fatigue, dense  left hemiplegia with right gaze preference with difficulty tracking to left lateral field.  Therapy has been ongoing and patient working on attempts at standing with left knee blocked.  CIR was recommended due to functional decline.    Patient transferred to CIR on 06/22/2020 .    Patient currently requires max with basic self-care skills secondary to abnormal tone and unbalanced muscle activation, decreased visual perceptual skills, decreased visual motor skills and field cut, decreased attention to left, decreased attention, decreased problem solving, decreased safety awareness and decreased memory and decreased sitting balance, decreased standing balance, decreased postural control, hemiplegia and decreased balance strategies.  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  Patient will benefit from skilled intervention to increase independence with basic self-care skills prior to discharge home with care partner.  Anticipate patient will require minimal physical assistance and follow up home health.  OT - End of Session Activity Tolerance: Tolerates 10 - 20 min activity with multiple rests Endurance Deficit: Yes OT Assessment Rehab Potential (ACUTE ONLY): Good OT Patient demonstrates impairments in the following area(s): Balance;Cognition;Endurance;Motor;Perception;Safety;Sensory;Vision OT Basic ADL's Functional Problem(s): Eating;Grooming;Bathing;Dressing;Toileting OT Transfers Functional Problem(s): Toilet;Tub/Shower OT Additional Impairment(s): Fuctional Use of Upper Extremity OT Plan OT Intensity: Minimum of 1-2 x/day, 45 to 90 minutes OT Frequency: 5 out of 7 days OT Duration/Estimated Length of Stay: 25 - 30 days OT Treatment/Interventions: Balance/vestibular training;Cognitive remediation/compensation;Discharge planning;Functional mobility training;DME/adaptive equipment instruction;Functional electrical stimulation;Neuromuscular  re-education;Patient/family education;Psychosocial support;Therapeutic Activities;Self Care/advanced ADL retraining;Therapeutic Exercise;UE/LE Strength taining/ROM;UE/LE Coordination activities;Visual/perceptual remediation/compensation;Splinting/orthotics OT Self Feeding Anticipated Outcome(s): supervision OT Basic Self-Care Anticipated Outcome(s): min A OT Toileting Anticipated Outcome(s): min A OT Bathroom Transfers Anticipated Outcome(s): min A OT Recommendation Patient destination: Home Follow Up Recommendations: Home health OT Equipment Recommended: 3 in 1 bedside comode;Tub/shower bench   OT Evaluation Precautions/Restrictions  Precautions Precautions: Fall Precaution Comments: Lt hemi with right gaze preference Restrictions Weight Bearing Restrictions: No    Pain Pain Assessment Pain Score: 0-No pain Home Living/Prior Functioning Home Living Family/patient expects to be discharged to:: Private residence Living Arrangements: Spouse/significant other Available Help at Discharge: Family,Available PRN/intermittently Type of Home: House Home Access: Stairs to enter [employer] of Steps: 5 Entrance Stairs-Rails: Left,Right,Can reach both Home Layout: Two level,Able to live on main level with bedroom/bathroom Bathroom Shower/Tub: Chiropodist: Standard  Lives With: Spouse Prior Function Level of Independence: Independent with gait,Independent with homemaking with ambulation,Independent with transfers,Independent with basic ADLs  Able to Take Stairs?: Yes Driving: Yes Vocation: Full time employment Comments: Pt drives a forklift at work Vision Baseline Vision/History: No visual deficits Patient Visual Report: Other (comment) Eye Alignment: Impaired (comment) Ocular Range of Motion: Within Functional Limits Alignment/Gaze Preference: Within Defined Limits Tracking/Visual Pursuits: Decreased smoothness of horizontal tracking;Other (comment)  (increased eye bounce in B eyes following target) Convergence: Impaired (comment) (decreased R eye convergence) Visual Fields: Left visual field deficit (minimal L visual field cut) Additional Comments: able to read clock on wall, signs on wall in B visual fields Perception  Perception: Impaired Inattention/Neglect: Does not attend to left side of body;Does not attend to left visual field Praxis Praxis: Intact Cognition Overall Cognitive Status: Impaired/Different from baseline Arousal/Alertness: Awake/alert Orientation Level: Person;Place;Situation Person: Oriented Place: Oriented Situation: Oriented Year: 2022  Month: June Day of Week: Incorrect (Thursday) Memory: Impaired Memory Impairment: Decreased short term memory;Storage deficit Decreased Short Term Memory: Verbal basic;Functional basic Immediate Memory Recall: Sock;Blue;Bed Memory Recall Sock: With Cue Memory Recall Blue: Without Cue Memory Recall Bed: With Cue Attention: Sustained Focused Attention: Appears intact Sustained Attention: Impaired Sustained Attention Impairment: Verbal basic;Functional basic Awareness: Impaired Awareness Impairment: Intellectual impairment Problem Solving: Impaired Problem Solving Impairment: Verbal basic;Functional basic Executive Function: Reasoning Reasoning: Impaired Reasoning Impairment: Functional basic Safety/Judgment: Impaired Sensation Sensation Light Touch: Impaired Detail Light Touch Impaired Details: Absent LLE;Impaired LUE Hot/Cold: Impaired by gross assessment Proprioception: Impaired Detail Proprioception Impaired Details: Impaired LLE;Impaired LUE Stereognosis: Impaired by gross assessment Coordination Gross Motor Movements are Fluid and Coordinated: No Fine Motor Movements are Fluid and Coordinated: No Coordination and Movement Description: impaired due to dense L hemiplegia and poor trunk control Finger Nose Finger Test: unable to achieve with LUE Motor   Motor Motor: Hemiplegia  Trunk/Postural Assessment  Cervical Assessment Cervical Assessment: Within Functional Limits Thoracic Assessment Thoracic Assessment: Within Functional Limits Lumbar Assessment Lumbar Assessment: Within Functional Limits Postural Control Postural Control: Deficits on evaluation Trunk Control: L lean when not attending to L side Protective Responses: delayed when Leaning to the L  Balance Static Sitting Balance Static Sitting - Level of Assistance: 4: Min assist Dynamic Sitting Balance Dynamic Sitting - Level of Assistance: 2: Max assist Static Standing Balance Static Standing - Level of Assistance: Not tested (comment) (pt stood in stedy, but has very limited LLE function to stand without) Extremity/Trunk Assessment RUE Assessment RUE Assessment: Within Functional Limits LUE Assessment LUE Assessment: Exceptions to [facility] Passive Range of Motion (PROM) Comments: no subluxation felt, but at risk due to heavy arm Active Range of Motion (AROM) Comments: pt has slight finger flexion in first 2 digits LUE Body System: Neuro Brunstrum levels for arm and hand: Arm;Hand Brunstrum level for arm: Stage I Presynergy Brunstrum level for hand: Stage I Flaccidity  Care Tool Care Tool Self Care Eating     min A    Oral Care    Oral Care Assist Level: Minimal Assistance - Patient > 75%    Bathing   Body parts bathed by patient: Chest;Abdomen;Left arm;Face Body parts bathed by helper: Right arm;Front perineal area;Buttocks;Right upper leg;Left upper leg;Right lower leg;Left lower leg   Assist Level: Maximal Assistance - Patient 24 - 49%    Upper Body Dressing(including orthotics)   What is the patient wearing?: Pull over shirt   Assist Level: Maximal Assistance - Patient 25 - 49%    Lower Body Dressing (excluding footwear)   What is the patient wearing?: Incontinence brief;Pants Assist for lower body dressing: Dependent - Patient 0%    Putting on/Taking  off footwear   What is the patient wearing?: Non-skid slipper socks Assist for footwear: Dependent - Patient 0%       Care Tool Toileting Toileting activity   Assist for toileting: 2 Helpers       Care Tool Transfers Sit to stand transfer   Sit to stand assist level: 2 Helpers (used stedy lift)    Chair/bed transfer   Chair/bed transfer assist level: Dependent - mechanical lift     Toilet transfer   Assist Level: 2 Helpers (used stedy lift to [facility] over toilet)     Care Tool Cognition Expression of Ideas and Wants Expression of Ideas and Wants: Some difficulty - exhibits some difficulty with expressing needs and ideas (e.g, some words or finishing thoughts) or speech is  not clear   Understanding Verbal and Non-Verbal Content Understanding Verbal and Non-Verbal Content: Usually understands - understands most conversations, but misses some part/intent of message. Requires cues at times to understand   Memory/Recall Ability *first 3 days only Memory/Recall Ability *first 3 days only: That he or she is in a hospital/hospital unit;Current season    Refer to Care Plan for Long Term Goals  SHORT TERM GOAL WEEK 1 OT Short Term Goal 1 (Week 1): Pt will be able to complete stedy transfer to toilet with min A of 1. OT Short Term Goal 2 (Week 1): Pt will be able to stand in stedy with min A while using R hand to adjust clothing over hips with min A. OT Short Term Goal 3 (Week 1): Pt will don shirt with min A. OT Short Term Goal 4 (Week 1): Pt will be able to self mobilize LUE with min A. OT Short Term Goal 5 (Week 1): Pt will be able to self feed with supervision.  Recommendations for other services: None    Skilled Therapeutic Intervention ADL ADL Eating: Minimal assistance Grooming: Minimal assistance Where Assessed-Grooming: Sitting at sink Upper Body Bathing: Maximal assistance Where Assessed-Upper Body Bathing: Sitting at sink Lower Body Bathing: Dependent Where  Assessed-Lower Body Bathing: Sitting at sink Upper Body Dressing: Maximal assistance Where Assessed-Upper Body Dressing: Sitting at sink Lower Body Dressing: Dependent Where Assessed-Lower Body Dressing: Sitting at sink Toileting: Dependent Where Assessed-Toileting: Bedside Commode;Glass blower/designer: Moderate assistance Toilet Transfer Method: Other (comment) (stedy +2) Toilet Transfer Equipment: Bedside commode;Raised toilet seat    Pt seen for initial evaluation and ADL training with a focus on mobility using the stedy to practice toilet transfers, sit to stands for LB self care. Pt did very well pulling up to stand but he has significant external rotation and his foot needs to be placed carefully along with thigh strap to keep knees in neutral.  Pt did complete self care with max A at sink in wc with +2 A from nurse tech.  Discussed role of OT, discussed pt's goals.  Pt is highly motivated and follows directions well. Pt demonstrated good potential. Pt opted to stay in wc.  Resting in TIS wc with belt alarm on and all needs met.   Discharge Criteria: Patient will be discharged from OT if patient refuses treatment 3 consecutive times without medical reason, if treatment goals not met, if there is a change in medical status, if patient makes no progress towards goals or if patient is discharged from hospital.  The above assessment, treatment plan, treatment alternatives and goals were discussed and mutually agreed upon: by patient  [facility] 06/23/2020, 12:50 PM

## 2020-06-23 NOTE — Evaluation (Signed)
Physical Therapy Assessment and Plan  Patient Details  Name: [name] MRN: [id number] Date of Birth: Dec 20, 1965  PT Diagnosis: Abnormal posture, Abnormality of gait, Hemiparesis non-dominant, Impaired cognition, Impaired sensation and Muscle weakness Rehab Potential: Good ELOS: 3.5-4 weeks   Today's Date: 06/23/2020 PT Individual Time: 0804-0900 PT Individual Time Calculation (min): 56 min    Hospital Problem: Principal Problem:   Thalamic hemorrhage ([place])   Past Medical History:  Past Medical History:  Diagnosis Date  . Chronic combined systolic and diastolic CHF (congestive heart failure) (HCC)    EF 25-30%  . Diastolic dysfunction   . Dyslipidemia   . Hyperlipidemia   . Hypertension   . Nonischemic dilated cardiomyopathy ([place])    felt secondary to HTN with no ischemia on nuclear stress test 05/2012, EF 25-30%  . Shortness of breath    Past Surgical History:  Past Surgical History:  Procedure Laterality Date  . KNEE ARTHROSCOPY Left 04/13/2016   Procedure: ARTHROSCOPY KNEE WITH DEBRIDEMENT;  Surgeon: Garald Balding, MD;  Location: [facility];  Service: Orthopedics;  Laterality: Left;  . KNEE ARTHROSCOPY WITH MEDIAL MENISECTOMY Right 04/13/2016   Procedure: KNEE ARTHROSCOPY WITH MEDIAL MENISECTOMY;  Surgeon: Garald Balding, MD;  Location: [facility];  Service: Orthopedics;  Laterality: Right;  . NASAL FRACTURE SURGERY      Assessment & Plan Clinical Impression: Patient is a 55 y.o. RH male with history ofobesity, HTN, and ICM, chronic combined systolic/diastolic CHF who was admitted on 06/08/2020 after wife found patient in bathroom with right gaze preference, right-sided weakness and right-sided numbness. Blood pressure is elevated at 200-230s and patient had complaints of headache. 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 He was started on Cleviprex and had increasing lethargy with snoring  requiring nonrebreather mask in ED. He was started on hypertonic saline and follow-up MR MRA was negative for vascular malformation,occlusion or significant stenosis. MRI brain showed no substantial change in size of hemorrhage and stable 4 mm leftward shift. Carotid Dopplers were negative for significant ICA stenosis. 2D echo done showing EF 65 to 70% with no significant wall abnormality and severe LVH. Serial CT head done for monitoring and showed partial clearing of intraventricular hemorrhage with mild progressive ventricular enlargement without frank hydrocephalus. He was weaned off hypertonic saline as well as Cleviprex and blood pressure control has improved with addition of multiple medications.  He has had issues with bouts of agitation and was treated with CIWA protocol. Seroquel added to help manage delirium. Swallow evaluation completed and patient placed on dysphagia 2 with nectar liquids initially. He continued to have issues with significant headache and valproic acid added. On 05/27,patient was found in bed lethargic with minimal responsiveness,had neck jerking movements to the left with left gaze deviation jerking to the left with left gaze deviation. Repeat CT of head was stable. LT-EEG done showing slowing generalized lateralization to right hemisphere without definite epileptiform discharges. Low-dose Keppra was added for likely partial complex seizure and he was admitted to ICU briefly on 05/27 for closer monitoring due to decreasing in neurological status. He has had issues with hyponatremia as well as hyperkalemia requiring a dose of Kayexalate. Acute renal failure is resolving. As mentation back to baseline, he was transferred back to regular floor.Diet has been advanced to dysphagia 3 thin liquids. He continues to be limited by fatigue, dense left hemiplegia with right gaze preference with difficulty tracking to left lateral field. Therapy has been  ongoing and  patient working on attempts at standing with left knee blocked. CIR was recommended due to functional decline. Patient transferred to CIR on 06/22/2020 .   Patient currently requires +2 max assist for limited mobility secondary to muscle weakness, muscle joint tightness and muscle paralysis, decreased cardiorespiratoy endurance, impaired timing and sequencing, abnormal tone, unbalanced muscle activation and decreased motor planning, decreased midline orientation and decreased attention to left, decreased attention, decreased awareness, decreased problem solving, decreased safety awareness, decreased memory and delayed processing and decreased sitting balance, decreased standing balance, decreased postural control, hemiplegia and decreased balance strategies.  Prior to hospitalization, patient was independent  with mobility and lived with Spouse in a House home.  Home access is 5Stairs to enter.  Patient will benefit from skilled PT intervention to maximize safe functional mobility, minimize fall risk and decrease caregiver burden for planned discharge home with 24 hour assist.  Anticipate patient will benefit from follow up [facility] at discharge.  PT - End of Session Activity Tolerance: Tolerates 30+ min activity with multiple rests Endurance Deficit: Yes PT Assessment Rehab Potential (ACUTE/IP ONLY): Good PT Barriers to Discharge: Inaccessible home environment;Decreased caregiver support;Home environment access/layout PT Patient demonstrates impairments in the following area(s): Balance;Perception;Behavior;Safety;Edema;Sensory;Endurance;Skin Integrity;Motor;Nutrition;Pain PT Transfers Functional Problem(s): Bed Mobility;Bed to Chair;Car;Furniture PT Locomotion Functional Problem(s): Ambulation;Wheelchair Mobility;Stairs PT Plan PT Intensity: Minimum of 1-2 x/day ,45 to 90 minutes PT Frequency: 5 out of 7 days PT Duration Estimated Length of Stay: 3.5-4 weeks PT Treatment/Interventions: Ambulation/gait  training;Community reintegration;DME/adaptive equipment instruction;Neuromuscular re-education;Psychosocial support;Stair training;UE/LE Strength taining/ROM;Wheelchair propulsion/positioning;Balance/vestibular training;Discharge planning;Functional electrical stimulation;Pain management;Skin care/wound management;Therapeutic Activities;UE/LE Coordination activities;Cognitive remediation/compensation;Disease management/prevention;Functional mobility training;Patient/family education;Splinting/orthotics;Therapeutic Exercise;Visual/perceptual remediation/compensation PT Transfers Anticipated Outcome(s): min assist using LRAD PT Locomotion Anticipated Outcome(s): min assist using LRAD PT Recommendation Follow Up Recommendations: Home health PT;24 hour supervision/assistance Patient destination: Home Equipment Recommended: To be determined   PT Evaluation Precautions/Restrictions  Precautions Precautions: Fall Precaution Comments: Lt hemi with right gaze preference Restrictions Weight Bearing Restrictions: No Pain Pain Assessment Pain Scale: 0-10 Pain Score: 0-No pain Pain Location: Head Home Living/Prior Functioning Home Living Available Help at Discharge: Family;Available PRN/intermittently (wife works full time at [employer]) Type of Home: House Home Access: Stairs to enter [employer] of Steps: 5 Entrance Stairs-Rails: Left;Right;Can reach both [place]: Two level;Able to live on main level with bedroom/bathroom  Lives With: Spouse [name]) Prior Function Level of Independence: Independent with gait;Independent with homemaking with ambulation;Independent with transfers  Able to Take Stairs?: Yes Driving: Yes Vocation: Full time employment Comments: 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, government Perception: Impaired Inattention/Neglect: Does not attend to left side of body;Does not attend to left visual field Praxis Praxis: Impaired Praxis Impairment Details:  Motor planning  Cognition Overall Cognitive Status: Impaired/Different from baseline Arousal/Alertness: Awake/alert Orientation Level: Oriented to place;Oriented to person;Oriented to situation;Disoriented to time (originally states he is "on the bus" but with cuing to visually scan surroundings able to correct himself saying "hospital") Attention: Focused;Sustained Focused Attention: Impaired Sustained Attention: Impaired Memory: Impaired Awareness: Impaired Safety/Judgment: Impaired Sensation Sensation Light Touch: Impaired Detail Light Touch Impaired Details: Absent LLE Hot/Cold: Not tested Proprioception: Impaired Detail Proprioception Impaired Details: Impaired LLE;Impaired LUE Stereognosis: Not tested Coordination Gross Motor Movements are Fluid and Coordinated: No Coordination and Movement Description: impaired due to dense L hemiplegia and poor trunk control Heel Shin Test: unable with L LE due to paresis Motor  Motor Motor: Hemiplegia;Abnormal tone;Abnormal postural alignment and control Motor - Skilled Clinical Observations: L hemiparesis (UE>LE),  poor trunk control   Trunk/Postural Assessment  Cervical Assessment Cervical Assessment: Exceptions to [facility] (forward head) Thoracic Assessment Thoracic Assessment: Exceptions to [facility] (rounded shoulders) Lumbar Assessment Lumbar Assessment: Exceptions to [facility] (posterior pelvic tilt in sitting) Postural Control Postural Control: Deficits on evaluation Trunk Control: L lean when not attending to L side Protective Responses: delayed when Leaning to the L  Balance Balance Balance Assessed: Yes Static Sitting Balance Static Sitting - Level of Assistance: 4: Min assist;3: Mod assist Static Sitting - Comment/# of Minutes: requires mod assist initially but progressed to min assist Dynamic Sitting Balance Dynamic Sitting - Level of Assistance: 2: Max assist Sitting balance - Comments: Slight posterior lean and left lateral lean  at EOB. increased control as session progressed Static Standing Balance Static Standing - Balance Support: Right upper extremity supported Static Standing - Level of Assistance: Other (comment) (standing in stedy) Dynamic Standing Balance Dynamic Standing - Level of Assistance: Other (comment) (no +2 assist to asses this safely) Extremity Assessment      RLE Assessment RLE Assessment: Within Functional Limits Active Range of Motion (AROM) Comments: WFL General Strength Comments: Grossly 4+/5 to 5/5 assessed in supine LLE Assessment LLE Assessment: Exceptions to [facility] Passive Range of Motion (PROM) Comments: noticed tightness into hip external rotation (likely due to prolonged positioning in the bed) causing limited hip internal rotation ROM LLE Strength Left Hip Flexion: 2-/5 Left Hip ABduction: 1/5 Left Hip ADduction: 2-/5 Left Knee Flexion: 0/5 Left Knee Extension: 2-/5 Left Ankle Dorsiflexion: 1/5 Left Ankle Plantar Flexion: 1/5  Care Tool Care Tool Bed Mobility Roll left and right activity   Roll left and right assist level: Maximal Assistance - Patient 25 - 49%    Sit to lying activity   Sit to lying assist level: Total Assistance - Patient < 25%    Lying to sitting edge of bed activity   Lying to sitting edge of bed assist level: Maximal Assistance - Patient 25 - 49%     Care Tool Transfers Sit to stand transfer   Sit to stand assist level: 2 Helpers (requires use of stedy to complete safely with only 1person otherwise requires +2 assist)    Chair/bed transfer   Chair/bed transfer assist level: 2 Helpers (stedy)     Psychologist, counselling transfer activity did not occur: Safety/medical concerns        Care Tool Locomotion Ambulation Ambulation activity did not occur: Safety/medical concerns        Walk 10 feet activity Walk 10 feet activity did not occur: Safety/medical concerns       Walk 50 feet with 2 turns activity Walk 50 feet with  2 turns activity did not occur: Safety/medical concerns      Walk 150 feet activity Walk 150 feet activity did not occur: Safety/medical concerns      Walk 10 feet on uneven surfaces activity Walk 10 feet on uneven surfaces activity did not occur: Safety/medical concerns      Stairs Stair activity did not occur: Safety/medical concerns        Walk up/down 1 step activity Walk up/down 1 step or curb (drop down) activity did not occur: Safety/medical concerns     Walk up/down 4 steps activity did not occuR: Safety/medical concerns  Walk up/down 4 steps activity      Walk up/down 12 steps activity Walk up/down 12 steps activity did not occur: Safety/medical concerns  Pick up small objects from floor Pick up small object from the floor (from standing position) activity did not occur: Safety/medical concerns      Wheelchair Will patient use wheelchair at discharge?:  (TBD)          Wheel 50 feet with 2 turns activity      Wheel 150 feet activity        Refer to Care Plan for Long Term Goals  SHORT TERM GOAL WEEK 1 PT Short Term Goal 1 (Week 1): Pt will perform rolling R/L in bed with min assist PT Short Term Goal 2 (Week 1): Pt will perform supine<>sit with mod assist PT Short Term Goal 3 (Week 1): Pt wil perform sit<>stands using LRAD (but not mechanical lift) with mod assist PT Short Term Goal 4 (Week 1): Pt will initiate gait training  Recommendations for other services: None   Skilled Therapeutic Intervention Pt received supine in bed eating breakfast and agreeable to therapy session. Evaluation completed (see details above) with patient education regarding purpose of PT evaluation, PT POC and goals, therapy schedule, weekly team meetings, and other CIR information including safety plan and fall risk safety. Pt noted to be incontinent of bladder - requires total assist for LB clothing management and peri-care. Pt completed the below functional mobility tasks with the  specified levels of assistance. Requires use of stedy to perform sit<>stands safely at this time as no +2 assist available. Demos slight quad activation during stance but requires knee block to maintain upright safely. Moved stedy in front of mirror for biofeedback to improve upright, midline orientation in standing. Therapist provided pt with TIS W/C and Roho cushion to promote increased upright OOB activity tolerance while allowing proper pressure relief. At end of session pt left sitting in TIS w/c as hand-off to [place], [place].   Mobility Bed Mobility Bed Mobility: Rolling Right;Rolling Left;Supine to Sit;Sit to Supine Rolling Right: Maximal Assistance - Patient 25-49% Rolling Left: Moderate Assistance - Patient 50-74% Supine to Sit: Maximal Assistance - Patient - Patient 25-49% Sit to Supine: Total Assistance - Patient < 25% Transfers Transfers: Sit to Stand;Stand to Sit;Transfer via [place] to Stand: Maximal Assistance - Patient 25-49% (in stedy) Stand to Sit: Maximal Assistance - Patient 25-49% (in stedy) Transfer (Assistive device): Other (Comment) (stedy) Transfer via Lift Equipment: Animal nutritionist: No Gait Gait: No Stairs / Additional Locomotion Stairs: No Product manager Mobility: No (requires TIS w/c for trunk control safety at this time)   Discharge Criteria: Patient will be discharged from PT if patient refuses treatment 3 consecutive times without medical reason, if treatment goals not met, if there is a change in medical status, if patient makes no progress towards goals or if patient is discharged from hospital.  The above assessment, treatment plan, treatment alternatives and goals were discussed and mutually agreed upon: by patient  Tawana Scale , PT, DPT, CSRS 06/23/2020, 7:44 AM

## 2020-06-23 NOTE — Progress Notes (Signed)
Occupational Therapy Session Note  Patient Details  Name: [name] MRN: [id number] Date of Birth: [birth year]  Today's Date: 06/23/2020 OT Individual Time: 6295-2841 OT Individual Time Calculation (min): 30 min    Short Term Goals: Week 1:  OT Short Term Goal 1 (Week 1): Pt will be able to complete stedy transfer to toilet with min A of 1. OT Short Term Goal 2 (Week 1): Pt will be able to stand in stedy with min A while using R hand to adjust clothing over hips with min A. OT Short Term Goal 3 (Week 1): Pt will don shirt with min A. OT Short Term Goal 4 (Week 1): Pt will be able to self mobilize LUE with min A. OT Short Term Goal 5 (Week 1): Pt will be able to self feed with supervision.  Skilled Therapeutic Interventions/Progress Updates:  Pt in bathroom with nurse tech upon OT arrival.  Pt unable to have continent episode despite attempt. Pt did have soiled brief of urine. Pt completed sit<>stand at stedy with min assist +2 with manual assist needed to position LUE safely. Total dependence to donn clean brief and pull pants over hips.  Pt transported to TIS w/c.  Assessed LUE MMT in detail:  Shoulder: elevation: 1/5  Retraction 1/5                    horizontal adduction: 2-/5  Horizontal abduction: 1+/5                    Forward flexion 1/5 abduction 1/5                    External rotation 1+/5  Internal rotation 2/5 Elbow: flexion: 2/5  Extension 1+/5 Forearm: pronation 2-/5 supination 0/5 Wrist: flexion 1/5  Extension 0/5 Digits: Index-small flexion 3-/5 ; extension 0/5 ; thumb flexion 2-/5 extension 1+/5 Palmar abduction 1/5   Pt positioned with LUE in neutral resting position using pillows for support, call bell in reach, seat belt alarm on.      Therapy Documentation Precautions:  Precautions Precautions: Fall Precaution Comments: Lt hemi with right gaze preference Restrictions Weight Bearing Restrictions: No   Therapy/Group: Individual Therapy  Amie Critchley 06/23/2020, 3:23 PM

## 2020-06-23 NOTE — Progress Notes (Signed)
Pt very difficult to arouse for night time medication. Once pt awakens he is irate at being awakened. He is cussing at staff and unwilling to take vital BP medications. Will allow pt to calm down and retry giving medication.

## 2020-06-23 NOTE — Progress Notes (Signed)
20 g d/c'ed from R arm. Tip intact, pt tolerated well

## 2020-06-23 NOTE — Patient Care Conference (Signed)
Inpatient RehabilitationTeam Conference and Plan of Care Update Date: 06/23/2020   Time: 11:02 AM    Patient Name: [name]      Medical Record Number: [id number]  Date of Birth: 09-10-1965 Sex: Male         Room/Bed: 4W18C/4W18C-01 Payor Info: Payor: [employer]ET[employer] / Plan: [employer] NAP / Product Type: *No Product type* /    Admit Date/Time:  06/22/2020  2:56 PM  Primary Diagnosis:  Thalamic hemorrhage [facility])  Hospital Problems: Principal Problem:   Thalamic hemorrhage (HCC) Active Problems:   Acute blood loss anemia   AKI (acute kidney injury) (HCC)   Essential hypertension   Seizures (HCC)    Expected Discharge Date: Expected Discharge Date:  (Evals pending; ELOS 3-4 wks)  Team Members Present: Physician leading conference: Dr. Maryla Morrow Care Coodinator Present: Chana Bode, RN, BSN, CRRN;Becky Dupree, LCSW Nurse Present: Chana Bode, RN PT Present: Casimiro Needle, PT SLP Present: Colin Benton, SLP PPS Coordinator present : Fae Pippin, SLP     Current Status/Progress Goal Weekly Team Focus  Bowel/Bladder   Incontinent of B/B. LBM 06/22/2020  Pt to gain continence  Assess B/B every shift and PRN. Frequent toileting   Swallow/Nutrition/ Hydration   eval pending         ADL's             Mobility   eval pending         Communication   eval pending         Safety/Cognition/ Behavioral Observations  eval pending         Pain   Pt denies pain  Pain scale of <3/10  Assess pain every shift and PRN   Skin   SKin intact  no skin breakdown  Assess skin every shift and PRN     Discharge Planning:  new evaluation will assess today. Lives with wife who works during the day   Team Discussion: Thalamic hemorrhage with left inattention and hemiparesis.  History of seizures. MD monitoring BP and noted AKI improved.  Patient on target to meet rehab goals: Currently min assist to EOB and motor planning deficits limit to stedy transfers.  *See Care Plan and progress  notes for long and short-term goals.   Revisions to Treatment Plan:   Teaching Needs: Transfers, toileting, medications, secondary stroke risk management, etc.   Current Barriers to Discharge: Decreased caregiver support, Home enviroment access/layout and Incontinence  Possible Resolutions to Barriers: Family education     Medical Summary Current Status: Left hemiparesis and functional deficits secondary to right thalamic hemorrhage with intraventricular extension. Course complicated by seizure  Barriers to Discharge: Medical stability;Weight;Decreased family/caregiver support   Possible Resolutions to [employer] Focus: Therapies, follow labs - Hb, Cr, monitor for seizures   Continued Need for Acute Rehabilitation Level of Care: The patient requires daily medical management by a physician with specialized training in physical medicine and rehabilitation for the following reasons: Direction of a multidisciplinary physical rehabilitation program to maximize functional independence : Yes Medical management of patient stability for increased activity during participation in an intensive rehabilitation regime.: Yes Analysis of laboratory values and/or radiology reports with any subsequent need for medication adjustment and/or medical intervention. : Yes   I attest that I was present, lead the team conference, and concur with the assessment and plan of the team.   Chana Bode B 06/23/2020, 3:17 PM

## 2020-06-23 NOTE — Progress Notes (Signed)
Attempted again to give pt his BP medications. Pt, again, spit medications out. Called pt's wife [name] who was able to convince pt to take all of his meds. Pt's wife [name] did note that pt sounded sleepy and thought he may benefit from taking his seroquel later than the scheduled dose of 1800.

## 2020-06-23 NOTE — Progress Notes (Signed)
Inpatient Rehabilitation Center Individual Statement of Services  Patient Name:  [name]  Date:  06/23/2020  Welcome to the Inpatient Rehabilitation Center.  Our goal is to provide you with an individualized program based on your diagnosis and situation, designed to meet your specific needs.  With this comprehensive rehabilitation program, you will be expected to participate in at least 3 hours of rehabilitation therapies Monday-Friday, with modified therapy programming on the weekends.  Your rehabilitation program will include the following services:  Physical Therapy (PT), Occupational Therapy (OT), Speech Therapy (ST), 24 hour per day rehabilitation nursing, Neuropsychology, Care Coordinator, Rehabilitation Medicine, Nutrition Services and Pharmacy Services  Weekly team conferences will be held on Wednesday to discuss your progress.  Your Inpatient Rehabilitation Care Coordinator will talk with you frequently to get your input and to update you on team discussions.  Team conferences with you and your family in attendance may also be held.  Expected length of stay: 25-30 Days  Overall anticipated outcome: min level-overall  Depending on your progress and recovery, your program may change. Your Inpatient Rehabilitation Care Coordinator will coordinate services and will keep you informed of any changes. Your Inpatient Rehabilitation Care Coordinator's name and contact numbers are listed  below.  The following services may also be recommended but are not provided by the Inpatient Rehabilitation Center:   Driving Evaluations  Home Health Rehabiltiation Services  Outpatient Rehabilitation Services  Vocational Rehabilitation   Arrangements will be made to provide these services after discharge if needed.  Arrangements include referral to agencies that provide these services.  Your insurance has been verified to be:  Community education officer Your primary doctor is:  None  Pertinent information will be  shared with your doctor and your insurance company.  Inpatient Rehabilitation Care Coordinator:  Dossie Der, [place] [phone] or Luna [place]  Information discussed with and copy given to patient by: Lucy Chris, 06/23/2020, 1:59 PM

## 2020-06-23 NOTE — Progress Notes (Signed)
Inpatient Rehabilitation  Patient information reviewed and entered into eRehab system by Elroy Schembri Ausar Georgiou, OTR/L.   Information including medical coding, functional ability and quality indicators will be reviewed and updated through discharge.    

## 2020-06-23 NOTE — Progress Notes (Signed)
Mr. Deem slept well through the night only awakening for a headache.

## 2020-06-23 NOTE — Progress Notes (Addendum)
[name] PHYSICAL MEDICINE & REHABILITATION PROGRESS NOTE  Subjective/Complaints: Patient seen sitting up in bed this morning.  He states he slept well overnight.  No reported issues overnight.  ROS: Denies CP, SOB, N/V/D  Objective: Vital Signs: Blood pressure (!) 143/83, pulse 84, temperature 98.5 F (36.9 C), temperature source Oral, resp. rate 18, height 5\' 10"  (1.778 m), weight 105.4 kg, SpO2 98 %. No results found. Recent Labs    06/22/20 0311 06/23/20 0515  WBC 6.8 6.5  HGB 12.2* 11.8*  HCT 36.9* 36.2*  PLT 509* 516*   Recent Labs    06/22/20 0311 06/23/20 0515  NA 136 137  K 3.6 3.8  CL 100 99  CO2 27 29  GLUCOSE 103* 93  BUN 18 14  CREATININE 1.32* 1.26*  CALCIUM 9.4 9.7    Intake/Output Summary (Last 24 hours) at 06/23/2020 1030 Last data filed at 06/23/2020 0700 Gross per 24 hour  Intake 300 ml  Output 1800 ml  Net -1500 ml        Physical Exam: BP (!) 143/83 (BP Location: Left Arm)   Pulse 84   Temp 98.5 F (36.9 C) (Oral)   Resp 18   Ht 5\' 10"  (1.778 m)   Wt 105.4 kg   SpO2 98%   BMI 33.34 kg/m  Constitutional: No distress . Vital signs reviewed.  Obese. HENT: Normocephalic.  Atraumatic. Eyes: EOMI. No discharge. Cardiovascular: No JVD.  RRR. Respiratory: Normal effort.  No stridor.  Bilateral clear to auscultation. GI: Non-distended.  BS +. Skin: Warm and dry.  Intact. Psych: Normal mood.  Normal behavior. Musc: Mild edema LUE/LE.  No tenderness in extremities. Neuro: Alert and oriented x3 Dysarthria Motor: LUE/LLE: 0/5 proximal distal Left inattention RUE/RLE: 5/5 proximal distal Sensation intact light touch Left eye lags during visual tracking (chronic). Visual fields appear to be grossly intact. LUE 0/5, LLE 1/5 HE but otherwise 0 to tr/5. RUE and RLE 5/5. Mild sensory changes LUE and LLE to LT and pain. DTR's 1+ throughout.  Assessment/Plan: 1. Functional deficits which require 3+ hours per day of interdisciplinary therapy  in a comprehensive inpatient rehab setting.  Physiatrist is providing close team supervision and 24 hour management of active medical problems listed below.  Physiatrist and rehab team continue to assess barriers to discharge/monitor patient progress toward functional and medical goals   Care Tool:  Bathing              Bathing assist       Upper Body Dressing/Undressing Upper body dressing   What is the patient wearing?: Hospital gown only    Upper body assist Assist Level: Moderate Assistance - Patient 50 - 74%    Lower Body Dressing/Undressing Lower body dressing            Lower body assist Assist for lower body dressing: Maximal Assistance - Patient 25 - 49%     Toileting Toileting    Toileting assist Assist for toileting: Total Assistance - Patient < 25%     Transfers Chair/bed transfer  Transfers assist           Locomotion Ambulation   Ambulation assist              Walk 10 feet activity   Assist           Walk 50 feet activity   Assist           Walk 150 feet activity   Assist  Walk 10 feet on uneven surface  activity   Assist           Wheelchair     Assist               Wheelchair 50 feet with 2 turns activity    Assist            Wheelchair 150 feet activity     Assist           Medical Problem List and Plan: 1.Left hemiparesis and functional deficitssecondary to right thalamic hemorrhage with intraventricular extension. Course complicated by seizure  Begin CIR evaluations   Team conference today to discuss current and goals and coordination of care, home and environmental barriers, and discharge planning with nursing, case manager, and therapies. Please see conference note from today as well.  2. Antithrombotics: -DVT/anticoagulation:Pharmaceutical:Lovenox -antiplatelet therapy: N/AA due to bleed 3.Ongoing headaches/pain  Management:Continue Depakote and Keppra. 4. Mood:LCSW to follow for evaluation and support -antipsychotic agents: N/AA 5. Neuropsych: This patientis intermittentlycapable of making decisions onhisown behalf. 6. Skin/Wound Care:Routine pressure-relief measures. 7. Fluids/Electrolytes/Nutrition:Monitor intake/output.  9.Partial complex seizure: Continue Keppra 500mg  twice daily. 10.HTN: Monitor blood pressures 3 times daily.  --Continue amlodipine, labetalol, losartan and clonidine -SBP goal of <160 given ICH  Monitor with increased exertion 11.Dyslipidemia: On statin. 12.Acute kidney injury:   Creatinine 1.26 on 6/1  Continue to monitor 13.History of alcohol abuse/delirium:Continue Seroquel 25 mg at bedtime to help with sleep/wake cycle --Continue folic acid and thiamine supplementation 14.Acute blood loss anemia:   Hemoglobin 11.8 on 6/1  Continue to monitor 15.Morbid obesity/OHS/OSA?: Outpatient sleep study recommended for work-up  LOS: 1 days A FACE TO FACE EVALUATION WAS PERFORMED  [name] 8/1 06/23/2020, 10:30 AM

## 2020-06-24 DIAGNOSIS — G441 Vascular headache, not elsewhere classified: Secondary | ICD-10-CM

## 2020-06-24 MED ORDER — QUETIAPINE FUMARATE 25 MG PO TABS
25.0000 mg | ORAL_TABLET | Freq: Every day | ORAL | Status: DC
  Administered 2020-06-24 – 2020-07-20 (×27): 25 mg via ORAL
  Filled 2020-06-24 (×27): qty 1

## 2020-06-24 MED ORDER — CLONIDINE HCL 0.1 MG PO TABS
0.1000 mg | ORAL_TABLET | Freq: Three times a day (TID) | ORAL | Status: DC
  Administered 2020-06-24 – 2020-07-21 (×81): 0.1 mg via ORAL
  Filled 2020-06-24 (×82): qty 1

## 2020-06-24 MED ORDER — TOPIRAMATE 25 MG PO TABS
25.0000 mg | ORAL_TABLET | Freq: Every day | ORAL | Status: DC
  Administered 2020-06-24 – 2020-07-06 (×13): 25 mg via ORAL
  Filled 2020-06-24 (×13): qty 1

## 2020-06-24 MED ORDER — LOSARTAN POTASSIUM 50 MG PO TABS
50.0000 mg | ORAL_TABLET | Freq: Two times a day (BID) | ORAL | Status: DC
  Administered 2020-06-24 – 2020-07-21 (×54): 50 mg via ORAL
  Filled 2020-06-24 (×56): qty 1

## 2020-06-24 NOTE — Progress Notes (Signed)
Wife requesting seroquel to be given at 8pm instead of dinner time. She said patient will get sleepy and will not take the rest of night time medications. Pharmacy notified.

## 2020-06-24 NOTE — Progress Notes (Signed)
Speech Language Pathology Daily Session Note  Patient Details  Name: [name] MRN: [id number] Date of Birth: 11/15/1965  Today's Date: 06/24/2020 SLP Individual Time: 0800-0900 SLP Individual Time Calculation (min): 60 min  Short Term Goals: Week 1: SLP Short Term Goal 1 (Week 1): Patient will consume current diet with minimal overt s/s of aspiration with Supervision verbal cues for use of swallowing compensatory strategies. SLP Short Term Goal 2 (Week 1): Patient will demonstrate efficient mastication with minimal oral residue without overt s/s of aspiration with supervision level verbal cues over 2 sessions prior to upgrade. SLP Short Term Goal 3 (Week 1): Patient will recall new, daily information with Mod verbal and visual cues. SLP Short Term Goal 4 (Week 1): Patient will demonstrate sustained attention to functional tasks for 15 minutes with Min verbal cues for redirection. SLP Short Term Goal 5 (Week 1): Patient will demonstrate functional problem solving for basic and familiar tasks with Min verbal cues. SLP Short Term Goal 6 (Week 1): Patient will identify 2 physical and 2 cognitive changes with Min verbal cues.  Skilled Therapeutic Interventions:   Patient seen for skilled ST session focusing on dysphagia and cognitive goals. Patient able to feed self after SLP setup assistance and intermittent cues to attend to left side of meal tray and to clear left side anterior spillage. Oral cavity completely clear at end of meal and no overt s/s aspiration or penetration observed. SLP discussed with NT regarding keeping him on full supervision versus changing to intermittent and decision was made to change to setup assistance and intermittent supervision with meal trays. Patient is very perceptive of things happening, such as asking SLP to tilt his WC down and that "I think its a red button". He is impulsive but also requiring cues to initiate and maintain attention during tasks. Patient  continues to benefit from skilled SLP intervention to maximize cognitive-linguistic and swallow function goals prior to discharge.  Pain Pain Assessment Pain Scale: 0-10 Pain Score: 0-No pain Faces Pain Scale: No hurt  Therapy/Group: Individual Therapy  Angela Nevin, MA, CCC-SLP Speech Therapy

## 2020-06-24 NOTE — Progress Notes (Signed)
Physical Therapy Session Note  Patient Details  Name: [name] MRN: [id number] Date of Birth: 15-May-1965  Today's Date: 06/24/2020 PT Individual Time: 7615-1834 PT Individual Time Calculation (min): 72 min   Short Term Goals: Week 1:  PT Short Term Goal 1 (Week 1): Pt will perform rolling R/L in bed with min assist PT Short Term Goal 2 (Week 1): Pt will perform supine<>sit with mod assist PT Short Term Goal 3 (Week 1): Pt wil perform sit<>stands using LRAD (but not mechanical lift) with mod assist PT Short Term Goal 4 (Week 1): Pt will initiate gait training  Skilled Therapeutic Interventions/Progress Updates:    Pt greeted reclined in TIS w/c, awake and agreeable to PT tx. Reports buttock pain from sitting in w/c for >4 hours. Education provided regarding repositioning and asking for nursing staff to assist with either reclining TIS or returning back to bed. Pt with ROHO cushion in TIS w/c that is inflated appropriately. Per conversation with nursing, concerns with transitioning to regular manual w/c due sliding out or falling out. Transported in [place] w/c to main rehab gym and completed sit<>stand in [place] with modA with heavily reliance on RUE for pulling up to standing. Able to sit in the perched position with CGA with RUE support to bar. Stedy transfer to high/low mat table. Completed 1x5 sit<>stands in [place] with modA for powering to rise and assist for stabilizing L knee in neutral alignment as tends to "flop out" into abduction. With each stand in the [place], worked on standing tolerance: Trial 1) 33 seconds Trial 2) 26 seconds Trial 3) 30 seconds Trial 4) 65 seconds Trial 5) 60 seconds  Progressed to standing trials without the Stedy to improve functional indep and mobility. Completed several (>15) repetitions of this with modA and therapist sitting on stool supporting/stabilizing L knee. With each repetition of standing - worked on visual tracking to the L with task overlay for  matching playing cards to back of mirror. Completed Stedy transfer back to his TIS w/c (same manner as above) and returned to room. Due to having no LLE leg rest for TIS w/c, offered recliner for better positioning and comfort. Stedy transfer with +2 assist for safety to recliner and positioned with pillows supporting LUE for shldr approximation.   Ended in recliner with BLE elevated, needs in reach, LUE supported with pillows, all needs met with safety belt alarm on.   Therapy Documentation Precautions:  Precautions Precautions: Fall Precaution Comments: Lt hemi with right gaze preference Restrictions Weight Bearing Restrictions: No General:    Therapy/Group: Individual Therapy  [name] 06/24/2020, 7:40 AM

## 2020-06-24 NOTE — Progress Notes (Signed)
Occupational Therapy Session Note  Patient Details  Name: [name] MRN: [id number] Date of Birth: [birth year]  Today's Date: 06/24/2020 OT Individual Time: 1008-1105 OT Individual Time Calculation (min): 57 min    Short Term Goals: Week 1:  OT Short Term Goal 1 (Week 1): Pt will be able to complete stedy transfer to toilet with min A of 1. OT Short Term Goal 2 (Week 1): Pt will be able to stand in stedy with min A while using R hand to adjust clothing over hips with min A. OT Short Term Goal 3 (Week 1): Pt will don shirt with min A. OT Short Term Goal 4 (Week 1): Pt will be able to self mobilize LUE with min A. OT Short Term Goal 5 (Week 1): Pt will be able to self feed with supervision.  Skilled Therapeutic Interventions/Progress Updates:    Pt sitting up in TIS w/c just having finished using bathroom with nurse techs.  Pt requesting to wash up and change clothes.  Pt wanting to call wife to see if she would bring some shorts in.  He required step by step VCs for problem solving and to facilitate visual scanning to operate smart phone and dial wife's number.    Pt doffed gown with min assist and bathed UB needing max assist with neuro re-ed component including tactile cues and manual positioning to facilitate increased proprioceptive input/weightbearing through LUE as well as initiating functional movements of the left arm to wash right side of body.  Multimodal cueing needed frequently for pt to correct trunk to midline due to moderate lean to left noted when pt focusing on functional tasks.  Pt was able to correct to midline without physical assist though.  Pt needed step by step multimodal cues and mod assist to donn tank shirt over head using hemitechniques.    He then stood at stedy with min assist initially however it increased to mod assist when pt divided his attention due to left sided lean tendency.  When cue'd pt was able to correct to midline, but drifted to the left again when  resuming task of bathing buttocks in standing. Total assist required to donn clean brief in standing.  Pt completed sit<>stand at stedy to w/c with min assist and donned pants with max assist including education on hemitechnique.  Pt seated in TIS, BLE supported, LUE supported with pillow, call bell in reach, seat belt alarm on.   When pt is able to focus on task of sitting/standing upright, he is able to maintain midline posture, however pt struggles to maintain balance when attention is divided.   Therapy Documentation Precautions:  Precautions Precautions: Fall Precaution Comments: Lt hemi with right gaze preference Restrictions Weight Bearing Restrictions: No   Therapy/Group: Individual Therapy  Amie Critchley 06/24/2020, 12:42 PM

## 2020-06-24 NOTE — Progress Notes (Signed)
Inpatient Rehabilitation Care Coordinator Assessment and Plan Patient Details  Name: [name] MRN: [id number] Date of Birth: 10-12-1965  Today's Date: 06/24/2020  Hospital Problems: Principal Problem:   Thalamic hemorrhage (HCC) Active Problems:   Acute blood loss anemia   AKI (acute kidney injury) (HCC)   Essential hypertension   Seizures (HCC)  Past Medical History:  Past Medical History:  Diagnosis Date  . Chronic combined systolic and diastolic CHF (congestive heart failure) (HCC)    EF 25-30%  . Diastolic dysfunction   . Dyslipidemia   . Hyperlipidemia   . Hypertension   . Nonischemic dilated cardiomyopathy (HCC)    felt secondary to HTN with no ischemia on nuclear stress test 05/2012, EF 25-30%  . Shortness of breath    Past Surgical History:  Past Surgical History:  Procedure Laterality Date  . KNEE ARTHROSCOPY Left 04/13/2016   Procedure: ARTHROSCOPY KNEE WITH DEBRIDEMENT;  Surgeon: Valeria Batman, MD;  Location: [facility];  Service: Orthopedics;  Laterality: Left;  . KNEE ARTHROSCOPY WITH MEDIAL MENISECTOMY Right 04/13/2016   Procedure: KNEE ARTHROSCOPY WITH MEDIAL MENISECTOMY;  Surgeon: Valeria Batman, MD;  Location: [facility];  Service: Orthopedics;  Laterality: Right;  . NASAL FRACTURE SURGERY     Social History:  reports that he has never smoked. He has never used smokeless tobacco. He reports current alcohol use of about 24.0 standard drinks of alcohol per week. He reports that he does not use drugs.  Family / Support Systems Marital Status: Married Patient Roles: Spouse,Parent,Other (Comment) (employee) Spouse/Significant Other: [name] [phone] Children: Daughter local who works and has children Other Supports: Friends and extended family Anticipated Caregiver: Freight forwarder and sister in-law Ability/Limitations of Caregiver: [name] works first shift Medical laboratory scientific officer: 24/7 Family Dynamics: Close  knit with family and feel they will provide the care he needs, he would for them if they needed it  Social History Preferred language: English Religion: Baptist Cultural Background: No issues Education: HS Read: Yes Write: Yes Employment Status: Employed 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 will look toward wife to make any decision at this time until he progreses enough for MD to change decision   Abuse/Neglect Abuse/Neglect Assessment Can Be Completed: Yes Physical Abuse: Denies Verbal Abuse: Denies Sexual Abuse: Denies Exploitation of patient/patient's resources: Denies Self-Neglect: Denies  Emotional Status Pt's affect, behavior and adjustment status: Pt is motivated to improve and feels he has already since admission. He has always been independent and taken care of himself and wants to get back to this level. Recent Psychosocial Issues: other health issues needs a PCP to follow them Psychiatric History: No history deferred depresson screen due to adjusting to rehab, do feel would benefit from seeing neuro-psych while here and address coping and substance abuse issues Substance Abuse History: ETOH plans to quit now, he did several years ago but started up again. He realizes now he can not social drink and the health issues is causes  Patient / Family Perceptions, Expectations & Goals Pt/Family understanding of illness & functional limitations: Pt and wife can explain his stroke and deficits, both talk with the MD and feel they have a basic understanding of his treatment plan going forward. Both are very hopeful he will improve and  make a lot of progress while here Premorbid pt/family roles/activities: Husband, father,  grandfather, employee, son, friend, co-worker Anticipated changes in roles/activities/participation: resume Pt/family expectations/goals: Pt states: " I want to do well here."  Wife states: " I hope he gets better and can move around on his own when he leaves here."  [employer]: None Premorbid Home Care/DME Agencies: None Transportation available at discharge: Wife drives, pt was driving PTA Resource referrals recommended: Neuropsychology  Discharge Planning Living Arrangements: Spouse/significant other Support Systems: Spouse/significant other,Children,Parent,Other relatives,Friends/neighbors Type of Residence: Private residence Civil engineer, contracting: Media planner (specify) Administrator) Financial Resources: Science writer Screen Referred: No Living Expenses: Lives with family Money Management: Patient,Spouse Does the patient have any problems obtaining your medications?: No (Getting refills rom Urgent Care unsure of compliance) Home Management: Wife Patient/Family Preliminary Plans: Return home with wife who does work first shift, she plans to take off a week when he is discharged, but made aware will need more care than one week. She voiced her sister can assist also. Discussed pt will need 24/7 care at discharge from rehab. Care Coordinator Barriers to Discharge: Decreased caregiver support,Insurance for SNF coverage,Medication compliance Care Coordinator Barriers to Discharge Comments: wife works Care Coordinator Anticipated Follow Up Needs: HH/OP,Support Group  Clinical Impression Pleasant motivated gentleman who wants to regain his independence prior to discharge, he has had an extensive CVA and will require 24/7 physical care, wife made aware of she thought her one week off would be enough. Will work on discharge needs and ask neuro-psych to see to address coping and substance abuse issues. He plans to quit drinking now.  Lucy Chris 06/24/2020, 9:46 AM

## 2020-06-24 NOTE — Progress Notes (Signed)
[place] PHYSICAL MEDICINE & REHABILITATION PROGRESS NOTE  Subjective/Complaints: Patient seen sitting up in his chair this AM, eating breakfast.  He states he slept fairly overnight, confirmed with sleep chart.  He is working with therapies.  Discussed cognition with therapies.  Encouraged Orthosis qhs.  He states he had a good first day of therapies yesterday.   ROS: Denies CP, SOB, N/V/D  Objective: Vital Signs: Blood pressure 136/84, pulse 79, temperature 98.4 F (36.9 C), temperature source Oral, resp. rate 19, height 5\' 10"  (1.778 m), weight 105.4 kg, SpO2 97 %. No results found. Recent Labs    06/22/20 0311 06/23/20 0515  WBC 6.8 6.5  HGB 12.2* 11.8*  HCT 36.9* 36.2*  PLT 509* 516*   Recent Labs    06/22/20 0311 06/23/20 0515  NA 136 137  K 3.6 3.8  CL 100 99  CO2 27 29  GLUCOSE 103* 93  BUN 18 14  CREATININE 1.32* 1.26*  CALCIUM 9.4 9.7    Intake/Output Summary (Last 24 hours) at 06/24/2020 0958 Last data filed at 06/24/2020 0900 Gross per 24 hour  Intake 420 ml  Output --  Net 420 ml        Physical Exam: BP 136/84 (BP Location: Left Arm)   Pulse 79   Temp 98.4 F (36.9 C) (Oral)   Resp 19   Ht 5\' 10"  (1.778 m)   Wt 105.4 kg   SpO2 97%   BMI 33.34 kg/m   Constitutional: No distress . Vital signs reviewed. Obese.  HENT: Normocephalic.  Atraumatic. Eyes: EOMI. No discharge. Cardiovascular: No JVD.  RRR. Respiratory: Normal effort.  No stridor.  Bilateral clear to auscultation. GI: Non-distended.  BS +. Skin: Warm and dry.  Intact. Psych: Normal mood.  Normal behavior. Musc: Mild edema LUE/LLE. No tenderness in extremities. Neuro: Alert and oriented x3 Dysarthria, unchanged Motor: LUE: 0/5 proximal to distal LLE: HF, KE 2-/5, ADF 1/5 Left inattention RUE/RLE: 5/5 proximal distal Sensation intact light touch Left eye lags during visual tracking (chronic).   Assessment/Plan: 1. Functional deficits which require 3+ hours per day of  interdisciplinary therapy in a comprehensive inpatient rehab setting.  Physiatrist is providing close team supervision and 24 hour management of active medical problems listed below.  Physiatrist and rehab team continue to assess barriers to discharge/monitor patient progress toward functional and medical goals   Care Tool:  Bathing    Body parts bathed by patient: Chest,Abdomen,Left arm,Face   Body parts bathed by helper: Right arm,Front perineal area,Buttocks,Right upper leg,Left upper leg,Right lower leg,Left lower leg     Bathing assist Assist Level: Maximal Assistance - Patient 24 - 49%     Upper Body Dressing/Undressing Upper body dressing   What is the patient wearing?: Pull over shirt    Upper body assist Assist Level: Maximal Assistance - Patient 25 - 49%    Lower Body Dressing/Undressing Lower body dressing      What is the patient wearing?: Incontinence brief     Lower body assist Assist for lower body dressing: Dependent - Patient 0%     Toileting Toileting    Toileting assist Assist for toileting: 2 Helpers     Transfers Chair/bed transfer  Transfers assist     Chair/bed transfer assist level: 2 Helpers     Locomotion Ambulation   Ambulation assist   Ambulation activity did not occur: Safety/medical concerns          Walk 10 feet activity   Assist  Walk  10 feet activity did not occur: Safety/medical concerns        Walk 50 feet activity   Assist Walk 50 feet with 2 turns activity did not occur: Safety/medical concerns         Walk 150 feet activity   Assist Walk 150 feet activity did not occur: Safety/medical concerns         Walk 10 feet on uneven surface  activity   Assist Walk 10 feet on uneven surfaces activity did not occur: Safety/medical concerns         Wheelchair     Assist Will patient use wheelchair at discharge?:  (TBD)             Wheelchair 50 feet with 2 turns  activity    Assist            Wheelchair 150 feet activity     Assist           Medical Problem List and Plan: 1.Left hemiparesis and functional deficitssecondary to right thalamic hemorrhage with intraventricular extension. Course complicated by seizure  Continue CIR   WHO/PRAFO ordered 2. Antithrombotics: -DVT/anticoagulation:Pharmaceutical:Lovenox -antiplatelet therapy: N/A due to bleed 3.Ongoing headaches/pain Management:Continue Depakote and Keppra.  Topamax 25 daily started on  4. Mood:LCSW to follow for evaluation and support -antipsychotic agents: N/AA 5. Neuropsych: This patientis intermittentlycapable of making decisions onhisown behalf. 6. Skin/Wound Care:Routine pressure-relief measures. 7. Fluids/Electrolytes/Nutrition:Monitor intake/output.  9.Partial complex seizure: Continue Keppra 500mg  twice daily. 10.HTN: Monitor blood pressures 3 times daily.  --Continue amlodipine, labetalol, losartan and clonidine -SBP goal of <160 given ICH  Labile on 6/2, monitor for trend  Monitor with increased exertion 11.Dyslipidemia: On statin. 12.Acute kidney injury:   Creatinine 1.26 on 6/1  Continue to monitor 13.History of alcohol abuse/delirium:Continue Seroquel 25 mg at bedtime to help with sleep/wake cycle --Continue folic acid and thiamine supplementation 14.Acute blood loss anemia:   Hemoglobin 11.8 on 6/1  Continue to monitor 15.Morbid obesity/OHS/OSA?: Outpatient sleep study recommended for work-up  LOS: 2 days A FACE TO FACE EVALUATION WAS PERFORMED  Jayshun Galentine 8/1 06/24/2020, 9:58 AM

## 2020-06-25 DIAGNOSIS — I69391 Dysphagia following cerebral infarction: Secondary | ICD-10-CM

## 2020-06-25 NOTE — IPOC Note (Signed)
Individualized overall Plan of Care (IPOC) Patient Details Name: Arieon Corcoran III MRN: [id number] DOB: 1965-05-16  Admitting Diagnosis: Thalamic hemorrhage [facility])  Hospital Problems: Principal Problem:   Thalamic hemorrhage (HCC) Active Problems:   Acute blood loss anemia   AKI (acute kidney injury) (HCC)   Essential hypertension   Seizures (HCC)   Vascular headache   Dysphagia, post-stroke     Functional Problem List: Nursing Bladder,Bowel,Safety,Medication Management,Endurance,Pain  PT Balance,Perception,Behavior,Safety,Edema,Sensory,Endurance,Skin Integrity,Motor,Nutrition,Pain  OT Balance,Cognition,Endurance,Motor,Perception,Safety,Sensory,Vision  SLP Cognition,Nutrition  TR         Basic ADL's: OT Eating,Grooming,Bathing,Dressing,Toileting     Advanced  ADL's: OT       Transfers: PT Bed Mobility,Bed to Chair,Car,Furniture  OT Toilet,Tub/Shower     Locomotion: PT Ambulation,Wheelchair Mobility,Stairs     Additional Impairments: OT Fuctional Use of Upper Extremity  SLP Swallowing,Social Cognition   Problem Solving,Memory,Attention,Awareness  TR      Anticipated Outcomes Item Anticipated Outcome  Self Feeding supervision  Swallowing  Mod I   Basic self-care  min A  Toileting  min A   Bathroom Transfers min A  Bowel/Bladder  Manage bowel with mod I assist and bladder wtih mod I assist  Transfers  min assist using LRAD  Locomotion  min assist using LRAD  Communication     Cognition  Supervision  Pain  at or below level 4  Safety/Judgment  maintain safety with cues/reminders   Therapy Plan: PT Intensity: Minimum of 1-2 x/day ,45 to 90 minutes PT Frequency: 5 out of 7 days PT Duration Estimated Length of Stay: 3.5-4 weeks OT Intensity: Minimum of 1-2 x/day, 45 to 90 minutes OT Frequency: 5 out of 7 days OT Duration/Estimated Length of Stay: 25 - 30 days SLP Intensity: Minumum of 1-2 x/day, 30 to 90 minutes SLP Frequency: 3 to 5 out of 7  days SLP Duration/Estimated Length of Stay: 25-30 days    Team Interventions: Nursing Interventions Bladder Management,Disease Management/Prevention,Medication Management,Discharge Planning,Pain Management,Bowel Management,Patient/Family Education  PT interventions Ambulation/gait training,Community reintegration,DME/adaptive equipment instruction,Neuromuscular re-education,Psychosocial support,Stair training,UE/LE Strength taining/ROM,Wheelchair propulsion/positioning,Balance/vestibular training,Discharge planning,Functional electrical stimulation,Pain management,Skin care/wound management,Therapeutic Activities,UE/LE Coordination activities,Cognitive remediation/compensation,Disease management/prevention,Functional mobility training,Patient/family education,Splinting/orthotics,Therapeutic Exercise,Visual/perceptual remediation/compensation  OT Interventions Balance/vestibular training,Cognitive remediation/compensation,Discharge planning,Functional mobility training,DME/adaptive equipment instruction,Functional electrical stimulation,Neuromuscular re-education,Patient/family education,Psychosocial support,Therapeutic Activities,Self Care/advanced ADL retraining,Therapeutic Exercise,UE/LE Strength taining/ROM,UE/LE Coordination activities,Visual/perceptual remediation/compensation,Splinting/orthotics  SLP Interventions Cognitive remediation/compensation,Internal/external aids,Dysphagia/aspiration precaution training,Therapeutic Activities,Environmental controls,Cueing hierarchy,Functional tasks,Patient/family education  TR Interventions    SW/CM Interventions Discharge Planning,Psychosocial Support,Patient/Family Education   Barriers to Discharge MD  Medical stability and Weight  Nursing Decreased caregiver support,Home environment access/layout 1 level 3 ste, left rail with wife  PT Inaccessible home environment,Decreased caregiver support,Home environment Best boy    OT      SLP      SW  Decreased caregiver support,Insurance for SNF coverage,Medication compliance wife works   Occupational psychologist: Destination: PT-Home ,OT- Home , SLP-Home Projected Follow-up: PT-Home health PT,24 hour supervision/assistance, OT-  Home health OT, SLP-24 hour supervision/assistance,Outpatient SLP,Home Health SLP Projected Equipment Needs: PT-To be determined, OT- 3 in 1 bedside comode,Tub/shower bench, SLP-None recommended by SLP Equipment Details: PT- , OT-  Patient/family involved in discharge planning: PT- Patient,  OT-Patient, SLP-Patient  MD ELOS: 22-25 days. Medical Rehab Prognosis:  Good Assessment: 55 year old RH male with history ofobesity, HTN, and ICM, chronic combined systolic/diastolic CHF who was admitted on 06/08/2020 after wife found patient in bathroom with right gaze preference, right-sided weakness and right-sided numbness. Blood pressure is elevated at 200-230s and patient had complaints of headache.  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 He was started on Cleviprex and had increasing lethargy with snoring requiring nonrebreather mask in ED. He was started on hypertonic saline and follow-up MR MRA was negative for vascular malformation,occlusion or significant stenosis. MRI brain showed no substantial change in size of hemorrhage and stable 4 mm leftward shift. Carotid Dopplers were negative for significant ICA stenosis. 2D echo done showing EF 65 to 70% with no significant wall abnormality and severe LVH. Serial CT head done for monitoring and showed partial clearing of intraventricular hemorrhage with mild progressive ventricular enlargement without frank hydrocephalus. He was weaned off hypertonic saline as well as Cleviprex and blood pressure control has improved with addition of multiple medications. He has had issues with bouts of agitation and was treated with CIWA protocol. Seroquel added to help manage delirium. Swallow  evaluation completed and patient placed on dysphagia 2 with nectar liquids initially. He continued to have issues with significant headache and valproic acid added. On 05/27,patient was found in bed lethargic with minimal responsiveness,had neck jerking movements to the left with left gaze deviation jerking to the left with left gaze deviation. Repeat CT of head was stable. LT-EEG done showing slowing generalized lateralization to right hemisphere without definite epileptiform discharges. Low-dose Keppra was added for likely partial complex seizure and he was admitted to ICU briefly on 05/27 for closer monitoring due to decreasing in neurological status. He has had issues with hyponatremia as well as hyperkalemia requiring a dose of Kayexalate.  As mentation back to baseline, he was transferred back to regular floor.Diet has been advanced to dysphagia 3 thin liquids. He continues to be limited by fatigue, dense left hemiparesis with right gaze preference with difficulty tracking to left lateral field.  We will set goals for min a with PT OT and supervision with SLP.  Due to the current state of emergency, patients may not be receiving their 3-hours of Medicare-mandated therapy.  See Team Conference Notes for weekly updates to the plan of care

## 2020-06-25 NOTE — Progress Notes (Signed)
06/25/20 1559  What Happened  Was fall witnessed? No  Was patient injured? No  Patient found on floor  Found by Staff-comment Landscape architect)  Stated prior activity other (comment) (Patient Sittng in recliner in room)  Follow Up  MD notified Delle Reining  Time MD notified [phone]  Family notified Yes - comment (Patient wife was called twice and no one answered)  Time family notified 1625  Simple treatment Other (comment) (Patient was assessed ; vitals taken)  Progress note created (see row info) Yes  Adult Fall Risk Assessment  Risk Factor Category (scoring not indicated) Fall has occurred during this admission (document High fall risk)  Age 55  Fall History: Fall within 6 months prior to admission 0  Elimination; Bowel and/or Urine Incontinence 2  Elimination; Bowel and/or Urine Urgency/Frequency 2  Medications: includes PCA/Opiates, Anti-convulsants, Anti-hypertensives, Diuretics, Hypnotics, Laxatives, Sedatives, and Psychotropics 5  Patient Care Equipment 1  Mobility-Assistance 2  Mobility-Gait 2  Mobility-Sensory Deficit 2  Altered awareness of immediate physical environment 0  Impulsiveness 0  Lack of understanding of one's physical/cognitive limitations 0  Total Score 16  Patient Fall Risk Level High fall risk  Adult Fall Risk Interventions  Required Bundle Interventions *See Row Information* High fall risk - low, moderate, and high requirements implemented  Additional Interventions Use of appropriate toileting equipment (bedpan, BSC, etc.)  Screening for Fall Injury Risk (To be completed on HIGH fall risk patients) - Assessing Need for Floor Mats  Risk For Fall Injury- Criteria for Floor Mats None identified - No additional interventions needed  Vitals  Temp 98.5 F (36.9 C)  BP 128/81  MAP (mmHg) 94  BP Location Right Arm  BP Method Automatic  Patient Position (if appropriate) Sitting  Pulse Rate 82  Pulse Rate Source Monitor  Resp 16  Oxygen Therapy  SpO2 100 %   Pain Assessment  Pain Scale 0-10  Pain Score 0  PAINAD (Pain Assessment in Advanced Dementia)  Breathing 0  Negative Vocalization 0  Facial Expression 0  Body Language 0  Consolability 0  PAINAD Score 0  Critical Care Pain Observation Tool (CPOT)  Facial Expression 0  Body Movements 0  Muscle Tension 0  Compliance with ventilator (intubated pts.) 0  Vocalization (extubated pts.) 0  CPOT Total 0  PCA/Epidural/Spinal Assessment  Respiratory Pattern Regular;Unlabored  Neurological  Neuro (WDL) WDL  Level of Consciousness Alert  Orientation Level Oriented X4  Speech Clear  R Hand Grip Strong  L Hand Grip Absent   Right Pronator Drift Present  Left Pronator Drift Present  R Foot Dorsiflexion Strong  L Foot Dorsiflexion Absent  R Foot Plantar Flexion Strong  L Foot Plantar Flexion Absent  RUE Motor Response Purposeful movement  RUE Sensation Full sensation  RUE Motor Strength 4  LUE Motor Response Non-purposeful movement  LUE Sensation Decreased  LUE Motor Strength 0  RLE Motor Response Purposeful movement  RLE Sensation Full sensation  RLE Motor Strength 4  LLE Motor Response No movement to painful stimulus  LLE Sensation Decreased  LLE Motor Strength 0  Seizure Activity  Psychomotor Symptoms None  Musculoskeletal  Musculoskeletal (WDL) X  Assistive Device Stedy  Generalized Weakness Yes  Weight Bearing Restrictions No  Musculoskeletal Details  LUE Limited movement  LLE Weakness  Integumentary  Integumentary (WDL) WDL  Pain Assessment  Date Pain First Started 06/25/20  Result of Injury No  Pain Assessment  Work-Related Injury No  Pain Screening  Response to Interventions  (  Patient assessed; vitals are taken)  Clinical Progression Not changed  Doree Fudge, LPN

## 2020-06-25 NOTE — Progress Notes (Signed)
Occupational Therapy Session Note  Patient Details  Name: [name] MRN: [id number] Date of Birth: 05-17-1965  Today's Date: 06/25/2020 OT Individual Time: 2725-3664 OT Individual Time Calculation (min): 28 min    Short Term Goals: Week 1:  OT Short Term Goal 1 (Week 1): Pt will be able to complete stedy transfer to toilet with min A of 1. OT Short Term Goal 2 (Week 1): Pt will be able to stand in stedy with min A while using R hand to adjust clothing over hips with min A. OT Short Term Goal 3 (Week 1): Pt will don shirt with min A. OT Short Term Goal 4 (Week 1): Pt will be able to self mobilize LUE with min A. OT Short Term Goal 5 (Week 1): Pt will be able to self feed with supervision.  Skilled Therapeutic Interventions/Progress Updates:    Pt greeted in the recliner with no c/o pain, agreeable to session and ADL needs met. Set pt up with a full length mirror to improve midline awareness while participating in tx with positive results. While sitting unsupported in the recliner, pt engaged in UE ranger exercises including shoulder flexion/extension, circumductive circles, and horizontal abduction/adduction given min facilitation. Pt with some proximal activation in the shoulder! He was very pleased to see this. At end of session pt remained in the recliner, safety belt fastened, and hemiplegic side protected. We discussed importance of always keeping his Lt arm elevated due to increased hand swelling.   Therapy Documentation Precautions:  Precautions Precautions: Fall Precaution Comments: Lt hemi with right gaze preference Restrictions Weight Bearing Restrictions: No Vital Signs: Therapy Vitals Temp: 98.4 F (36.9 C) Pulse Rate: 71 Resp: 18 BP: (!) 146/100 Patient Position (if appropriate): Sitting Oxygen Therapy SpO2: 100 % O2 Device: Room Air ADL: ADL Eating: Minimal assistance Grooming: Minimal assistance Where Assessed-Grooming: Sitting at sink Upper Body Bathing:  Maximal assistance Where Assessed-Upper Body Bathing: Sitting at sink Lower Body Bathing: Dependent Where Assessed-Lower Body Bathing: Sitting at sink Upper Body Dressing: Maximal assistance Where Assessed-Upper Body Dressing: Sitting at sink Lower Body Dressing: Dependent Where Assessed-Lower Body Dressing: Sitting at sink Toileting: Dependent Where Assessed-Toileting: Bedside Commode,Toilet Toilet Transfer: Moderate assistance Toilet Transfer Method: Other (comment) (stedy +2) Toilet Transfer Equipment: Bedside commode,Raised toilet seat     Therapy/Group: Individual Therapy  Khallid Pasillas A Kedrick Mcnamee 06/25/2020, 3:48 PM

## 2020-06-25 NOTE — Progress Notes (Signed)
Occupational Therapy Session Note  Patient Details  Name: [name] MRN: [id number] Date of Birth: [birth year]  Today's Date: 06/25/2020 OT Individual Time: 1130-1200 OT Individual Time Calculation (min): 30 min    Short Term Goals: Week 1:  OT Short Term Goal 1 (Week 1): Pt will be able to complete stedy transfer to toilet with min A of 1. OT Short Term Goal 2 (Week 1): Pt will be able to stand in stedy with min A while using R hand to adjust clothing over hips with min A. OT Short Term Goal 3 (Week 1): Pt will don shirt with min A. OT Short Term Goal 4 (Week 1): Pt will be able to self mobilize LUE with min A. OT Short Term Goal 5 (Week 1): Pt will be able to self feed with supervision.  Skilled Therapeutic Interventions/Progress Updates:    Pt sitting up in TIS w/c, eager to work on strengthening left arm, no c/o pain.  Neuro re-ed to LUE with mirror placed in front of pt to facilitate increased motor learning and visual feedback while completing AAROM.  Provided pt with VCs to facilitate visualization of functional tasks and external targets with each movement for improved motor control.  Pt also required manual support of LUE to recruit desired muscle isolation.  Pt completed 2 x 8 reps of the following (gravity eliminated planes): shoulder horizontal abd/adduction, shoulder ER/IR, elbow flexion/extension, forearm pronation, digital flexion/extension, thumb flexion and abduction.  Pt in TIS w/c, LUE supported with pillows in neutral resting position, call bell in reach, seat belt alarm on.  Therapy Documentation Precautions:  Precautions Precautions: Fall Precaution Comments: Lt hemi with right gaze preference Restrictions Weight Bearing Restrictions: No   Therapy/Group: Individual Therapy  Amie Critchley 06/25/2020, 12:59 PM

## 2020-06-25 NOTE — Progress Notes (Signed)
Speech Language Pathology Daily Session Note  Patient Details  Name: [name] MRN: [id number] Date of Birth: 1965-06-01  Today's Date: 06/25/2020 SLP Individual Time: 0820-0900 SLP Individual Time Calculation (min): 40 min  Short Term Goals: Week 1: SLP Short Term Goal 1 (Week 1): Patient will consume current diet with minimal overt s/s of aspiration with Supervision verbal cues for use of swallowing compensatory strategies. SLP Short Term Goal 2 (Week 1): Patient will demonstrate efficient mastication with minimal oral residue without overt s/s of aspiration with supervision level verbal cues over 2 sessions prior to upgrade. SLP Short Term Goal 3 (Week 1): Patient will recall new, daily information with Mod verbal and visual cues. SLP Short Term Goal 4 (Week 1): Patient will demonstrate sustained attention to functional tasks for 15 minutes with Min verbal cues for redirection. SLP Short Term Goal 5 (Week 1): Patient will demonstrate functional problem solving for basic and familiar tasks with Min verbal cues. SLP Short Term Goal 6 (Week 1): Patient will identify 2 physical and 2 cognitive changes with Min verbal cues.  Skilled Therapeutic Interventions:  Skilled treatment performed with focus on dysphagia and cognitive goals. Patient was consuming breakfast on arrival. Patient unaware of mild pocketing in left buccal cavity requiring verbal cues to clear. Patient reported difficulty with the orientation of his meal tray and food on his plate due to visual deficits and left inattention which resulted in frustration. SLP reviewed strategies to enhance orientation to plate including visual scanning to left visual field, and requesting for help. SLP assessed tolerance of dys 3 diet (grits, bite sized sausage) ad thin liquids. Mild L buccal pocketing and anterior labial spillage on L requiring initial cues to clear (increased pocketing with bite sized meat). Patient was able to effectively clear  pocketing with lingual and/or finger sweep, and liquid rinse with intermittent verbal reminders. No overt signs or symptoms of aspiration among all consistencies. Continue Dys 3 diet, thin liquids. Patient was able to successfully recall safe swallow strategies by the end of session after multiple repetitions and reminders (1. Eat slow, 2. Take small bites/sips, 3. Assess left cheek for pocketing). Patient performed oral care at supervision level following set-up from SLP. Patient was educated on memory strategies including taking notes (strategy used at baseline per Pt report), and utilizing a calendar. SLP facilitated short-term recall task including recall of newly learned information with min-to-mod verbal cues for success. Patient was left in bed with alarm activated and all needs within reach. Continue with current plan of care.   Pain Pain Assessment Pain Scale: 0-10 Pain Score: 0-No pain Pain Descriptors / Indicators: Headache Pain Intervention(s): Medication (See eMAR)  Therapy/Group: Individual Therapy  Tamala Ser 06/25/2020, 2:24 PM

## 2020-06-25 NOTE — Progress Notes (Addendum)
[facility] PHYSICAL MEDICINE & REHABILITATION PROGRESS NOTE  Subjective/Complaints: Patient seen sitting up in bed this morning, working with therapies.  He states he slept fairly well overnight.  Sleep chart not updated.  He notes improvement in headaches with medication changes.  ROS: Denies CP, SOB, N/V/D  Objective: Vital Signs: Blood pressure (!) 160/114, pulse 63, temperature 98.4 F (36.9 C), resp. rate 18, height 5\' 10"  (1.778 m), weight 105.4 kg, SpO2 100 %. No results found. Recent Labs    06/23/20 0515  WBC 6.5  HGB 11.8*  HCT 36.2*  PLT 516*   Recent Labs    06/23/20 0515  NA 137  K 3.8  CL 99  CO2 29  GLUCOSE 93  BUN 14  CREATININE 1.26*  CALCIUM 9.7    Intake/Output Summary (Last 24 hours) at 06/25/2020 1029 Last data filed at 06/24/2020 1920 Gross per 24 hour  Intake 400 ml  Output 125 ml  Net 275 ml        Physical Exam: BP (!) 160/114 (BP Location: Right Arm)   Pulse 63   Temp 98.4 F (36.9 C)   Resp 18   Ht 5\' 10"  (1.778 m)   Wt 105.4 kg   SpO2 100%   BMI 33.34 kg/m   Constitutional: No distress . Vital signs reviewed.  Obese. HENT: Normocephalic.  Atraumatic. Eyes: Left eye lags during visual tracking (chronic). No discharge. Cardiovascular: No JVD.  RRR. Respiratory: Normal effort.  No stridor.  Bilateral clear to auscultation. GI: Non-distended.  BS +. Skin: Warm and dry.  Intact. Psych: Normal mood.  Normal behavior. Musc: Mild edema LUE/LLE.  No tenderness in extremities. Neuro: Alert and oriented x3 Dysarthria, stable Motor: LUE: 0/5 proximal to distal LLE: HF, KE 2-/5, ADF 1/5, unchanged Left inattention RUE/RLE: 5/5 proximal distal Sensation intact light touch  Assessment/Plan: 1. Functional deficits which require 3+ hours per day of interdisciplinary therapy in a comprehensive inpatient rehab setting.  Physiatrist is providing close team supervision and 24 hour management of active medical problems listed  below.  Physiatrist and rehab team continue to assess barriers to discharge/monitor patient progress toward functional and medical goals   Care Tool:  Bathing    Body parts bathed by patient: Chest,Abdomen,Left arm,Face   Body parts bathed by helper: Right arm,Front perineal area,Buttocks,Right upper leg,Left upper leg,Right lower leg,Left lower leg     Bathing assist Assist Level: Maximal Assistance - Patient 24 - 49%     Upper Body Dressing/Undressing Upper body dressing   What is the patient wearing?: Pull over shirt    Upper body assist Assist Level: Maximal Assistance - Patient 25 - 49%    Lower Body Dressing/Undressing Lower body dressing      What is the patient wearing?: Incontinence brief     Lower body assist Assist for lower body dressing: Dependent - Patient 0%     Toileting Toileting    Toileting assist Assist for toileting: 2 Helpers     Transfers Chair/bed transfer  Transfers assist     Chair/bed transfer assist level: 2 Helpers     Locomotion Ambulation   Ambulation assist   Ambulation activity did not occur: Safety/medical concerns          Walk 10 feet activity   Assist  Walk 10 feet activity did not occur: Safety/medical concerns        Walk 50 feet activity   Assist Walk 50 feet with 2 turns activity did not occur: Safety/medical concerns  Walk 150 feet activity   Assist Walk 150 feet activity did not occur: Safety/medical concerns         Walk 10 feet on uneven surface  activity   Assist Walk 10 feet on uneven surfaces activity did not occur: Safety/medical concerns         Wheelchair     Assist Will patient use wheelchair at discharge?:  (TBD)             Wheelchair 50 feet with 2 turns activity    Assist            Wheelchair 150 feet activity     Assist           Medical Problem List and Plan: 1.Left hemiparesis and functional deficitssecondary to  right thalamic hemorrhage with intraventricular extension. Course complicated by seizure  Continue CIR   WHO/PRAFO nightly 2. Antithrombotics: -DVT/anticoagulation:Pharmaceutical:Lovenox -antiplatelet therapy: N/A due to bleed 3.Ongoing headaches/pain Management:Continue Depakote and Keppra.  Topamax 25 daily started on 6/2 with benefit 4. Mood:LCSW to follow for evaluation and support -antipsychotic agents: N/AA 5. Neuropsych: This patientis intermittentlycapable of making decisions onhisown behalf. 6. Skin/Wound Care:Routine pressure-relief measures. 7. Fluids/Electrolytes/Nutrition:Monitor intake/output.  9.Partial complex seizure: Continue Keppra 500mg  twice daily. 10.HTN: Monitor blood pressures 3 times daily.  --Continue amlodipine, labetalol, losartan and clonidine -SBP goal of <160 given ICH  ?  Trending up, will consider medication adjustments if persistent  Monitor with increased exertion 11.Dyslipidemia: On statin. 12.Acute kidney injury:   Creatinine 1.26 on 6/1  Encourage fluids  Continue to monitor 13.History of alcohol abuse/delirium:Continue Seroquel 25 mg at bedtime to help with sleep/wake cycle --Continue folic acid and thiamine supplementation 14.Acute blood loss anemia:   Hemoglobin 11.8 on 6/1, labs ordered for Monday  Continue to monitor 15.Morbid obesity/OHS/OSA?: Outpatient sleep study recommended for work-up 16.  Post stroke dysphagia  D3 thins, advance diet as tolerated  LOS: 3 days A FACE TO FACE EVALUATION WAS PERFORMED  Nareg Breighner Sunday 06/25/2020, 10:29 AM

## 2020-06-25 NOTE — Progress Notes (Signed)
Physical Therapy Session Note  Patient Details  Name: [name] MRN: [id number] Date of Birth: [birth year]  Today's Date: 06/25/2020 PT Individual Time: 1000-1100 PT Individual Time Calculation (min): 60 min   Short Term Goals: Week 1:  PT Short Term Goal 1 (Week 1): Pt will perform rolling R/L in bed with min assist PT Short Term Goal 2 (Week 1): Pt will perform supine<>sit with mod assist PT Short Term Goal 3 (Week 1): Pt wil perform sit<>stands using LRAD (but not mechanical lift) with mod assist PT Short Term Goal 4 (Week 1): Pt will initiate gait training  Skilled Therapeutic Interventions/Progress Updates:    Pt greeted supine in bed at start of session. Agreeable to PT tx. Reports he needs help getting cleaned up due to bowel incontinence. Rolled in bed towards his L side with modA and provided totalA for posterior pericare. Donned athletic shorts with maxA while supine in bed, able to raise his paretic L leg to thread into shorts and then able to use R arm to assist pulling over hips. Supine<>sit with modA with use of bed features and requires minA for sitting balance at EOB. While therapist adjusted hospital socks, pt with large LOB posteriorly, requiring maxA for correction. Provided Stedy and pt performed Stedy transfer (minA for standing in [place]) to TIS w/c. Removed hospital socks and donned his regular socks and tennis shoes with totalA for time management. W/c transport in [place] chair to main rehab gym and wheeled in // bars. With L knee block, completed repeated sit<>stands in // bar with RUE assisting to pull up to stand and min/modA for powering to rise. Able to stand in // bars with minA with L knee block and RUE to bar. Worked on static standing in // bar with postural awareness as well as lateral weight shifts L<>R. With weight shifting, pt with large buckling or LLE and limited ability to weight shift over L hip. Completed cone stacking activity while standing in // bar with L  knee block and modA for balance as he reached across his body to stack cones L<>R with his stronger RUE - emphasizing LLE weight shift and L visual gaze to improve L inattention. Pt wheeled back to his room at end of session and agreeable to remain seated in TIS chair - safety belt alarm applied and arm chair provided for LLE (no LLE leg rest available) to rest on. All needs met.  Therapy Documentation Precautions:  Precautions Precautions: Fall Precaution Comments: Lt hemi with right gaze preference Restrictions Weight Bearing Restrictions: No General:    Therapy/Group: Individual Therapy  [name] 06/25/2020, 7:33 AM

## 2020-06-25 NOTE — Progress Notes (Signed)
Occupational Therapy Session Note  Patient Details  Name: [name] MRN: [id number] Date of Birth: 05/14/1965  Today's Date: 06/25/2020 OT Individual Time: 1440-1510 OT Individual Time Calculation (min): 30 min    Short Term Goals: Week 1:  OT Short Term Goal 1 (Week 1): Pt will be able to complete stedy transfer to toilet with min A of 1. OT Short Term Goal 2 (Week 1): Pt will be able to stand in stedy with min A while using R hand to adjust clothing over hips with min A. OT Short Term Goal 3 (Week 1): Pt will don shirt with min A. OT Short Term Goal 4 (Week 1): Pt will be able to self mobilize LUE with min A. OT Short Term Goal 5 (Week 1): Pt will be able to self feed with supervision.  Skilled Therapeutic Interventions/Progress Updates:    Pt greeted sitting in recliner and agreeable to OT treatment session. Pt requesting if he can go to the gym himself and do some squats. OT had pt reason through why this ws not a good idea. Pt often forgetting he has hemiplegia on L side. OT provided pt with blue theraband and educated on bicep culrs he could complete while seated in recliner. Pt stated "so now I can do this with the L hand?" Ot had pt try, then he realized that he is unable to lift L side. Reviewed hemiplegia and weakness in relation to his stroke. Sit<>stand in [place] with min A. UB self-ROM seated in Stedy 3 sets of 10 shoulder FF, and elbow flex/ext. 3 ests of 10 mini squats from perched seat in [place]. Pt returned to recliner at end of session and left with alarm belt on, call bell in reach, and needs met.   Therapy Documentation Precautions:  Precautions Precautions: Fall Precaution Comments: Lt hemi with right gaze preference Restrictions Weight Bearing Restrictions: No Pain: Pain Assessment Pain Scale: 0-10 Pain Score: 4  Pain Type: Acute pain Pain Descriptors / Indicators: Headache Pain Intervention(s): Repositioned   Therapy/Group: Individual Therapy  Valma Cava 06/25/2020, 3:27 PM

## 2020-06-26 ENCOUNTER — Inpatient Hospital Stay (HOSPITAL_COMMUNITY): Payer: 59

## 2020-06-26 LAB — VITAMIN B1: Vitamin B1 (Thiamine): 712 nmol/L — ABNORMAL HIGH (ref 66.5–200.0)

## 2020-06-26 MED ORDER — DICLOFENAC SODIUM 1 % EX GEL
2.0000 g | Freq: Four times a day (QID) | CUTANEOUS | Status: DC
  Administered 2020-06-26 – 2020-07-21 (×90): 2 g via TOPICAL
  Filled 2020-06-26 (×3): qty 100

## 2020-06-26 NOTE — Progress Notes (Signed)
Orthopedic Tech Progress Note Patient Details:  [name] 1965/12/01 [id number]  Ortho Devices Type of Ortho Device: Ankle splint Ortho Device/Splint Location: RLE Ortho Device/Splint Interventions: Ordered,Application,Adjustment   Post Interventions Patient Tolerated: Well Instructions Provided: Adjustment of device,Care of device,Poper ambulation with device   Elsworth Ledin 06/26/2020, 4:55 PM

## 2020-06-26 NOTE — Progress Notes (Signed)
Occupational Therapy Session Note  Patient Details  Name: [name] MRN: [id number] Date of Birth: 18-Jun-1965  Today's Date: 06/26/2020 OT Individual Time: 0732-0900 OT Individual Time Calculation (min): 88 min   Short Term Goals: Week 1:  OT Short Term Goal 1 (Week 1): Pt will be able to complete stedy transfer to toilet with min A of 1. OT Short Term Goal 2 (Week 1): Pt will be able to stand in stedy with min A while using R hand to adjust clothing over hips with min A. OT Short Term Goal 3 (Week 1): Pt will don shirt with min A. OT Short Term Goal 4 (Week 1): Pt will be able to self mobilize LUE with min A. OT Short Term Goal 5 (Week 1): Pt will be able to self feed with supervision.  Skilled Therapeutic Interventions/Progress Updates:    Pt greeted in bed, motivated to shower today. Supine<sit completed with Mod A and he needed several attempts at sit<stand using Stedy due to Rt ankle pain. Pt in time able to come into standing with Min A. He then transferred to the 3:1 in shower for increased balance support. With pts permission, used the full length mirror to improve midline awareness while bathing. Pt very motivated to use mirror to improve sitting balance, mod cues to increase attention to balance/mirror. Cues and assistance for Lt arm positioning as at times he would allow affected limb to dangle off of seat. He used the LH sponge to wash his feet for safety. Hand over hand to use the Lt arm with pt exhibiting active bicipital contraction when assisting therapist wash his Rt forearm. +2 assist present for safety throughout shower due to pts moderate impulsivity with movement. +2 Mod A for transfer out of the shower due to ankle pain. Worked on Lt attention, visual scanning, and supported standing balance while completing oral care at the sink after, semi perched in [place]. Pt actively using the mirror to "center himself." Dressing completed at sit<stand level from EOB, using Stedy for  sit<stand. The mirror was once again placed in front of him with positive results and pt actively trying to find his midline. Min cues + assistance for carryover of hemi strategies during dressing. At end of session +2 for returning to bed, pt left with all needs within reach and hemiplegic side protected, PRAFO for the Lt LE and resting hand splint for the Lt UE. Bed alarm set. Tx focus placed on NMR, sitting balance, and adaptive self care skills.   Therapy Documentation Precautions:  Precautions Precautions: Fall Precaution Comments: Lt hemi with right gaze preference Restrictions Weight Bearing Restrictions: No Pain: RN in at start of session to provide pain medicine, we also used hot water in shower therapeutically for pain relief. Education initiated regarding diaphragmatic breathing strategies as well. Pain Assessment Pain Score: 4  ADL: ADL Eating: Minimal assistance Grooming: Minimal assistance Where Assessed-Grooming: Sitting at sink Upper Body Bathing: Maximal assistance Where Assessed-Upper Body Bathing: Sitting at sink Lower Body Bathing: Dependent Where Assessed-Lower Body Bathing: Sitting at sink Upper Body Dressing: Maximal assistance Where Assessed-Upper Body Dressing: Sitting at sink Lower Body Dressing: Dependent Where Assessed-Lower Body Dressing: Sitting at sink Toileting: Dependent Where Assessed-Toileting: Bedside Commode,Toilet Toilet Transfer: Moderate assistance Toilet Transfer Method: Other (comment) (stedy +2) Toilet Transfer Equipment: Bedside commode,Raised toilet seat     Therapy/Group: Individual Therapy  Koda Defrank A Lacorey Brusca 06/26/2020, 12:41 PM

## 2020-06-26 NOTE — Progress Notes (Addendum)
[place] PHYSICAL MEDICINE & REHABILITATION PROGRESS NOTE  Subjective/Complaints: Working with therapy Complains of right ankle pain. Says it has been present for 2 months and that he has had XR and MRI at [facility] but I only see left ankle XR in the system  ROS: Denies CP, SOB, N/V/D, +right ankle pain  Objective: Vital Signs: Blood pressure (!) 142/83, pulse 82, temperature 98.1 F (36.7 C), resp. rate 16, height 5\' 10"  (1.778 m), weight 105.4 kg, SpO2 100 %. No results found. No results for input(s): WBC, HGB, HCT, PLT in the last 72 hours. No results for input(s): NA, K, CL, CO2, GLUCOSE, BUN, CREATININE, CALCIUM in the last 72 hours.  Intake/Output Summary (Last 24 hours) at 06/26/2020 1415 Last data filed at 06/26/2020 0834 Gross per 24 hour  Intake 240 ml  Output --  Net 240 ml        Physical Exam: BP (!) 142/83 (BP Location: Left Arm)   Pulse 82   Temp 98.1 F (36.7 C)   Resp 16   Ht 5\' 10"  (1.778 m)   Wt 105.4 kg   SpO2 100%   BMI 33.34 kg/m   Constitutional: No distress . Vital signs reviewed.  Obese. HENT: Normocephalic.  Atraumatic. Eyes: Left eye lags during visual tracking (chronic). No discharge. Cardiovascular: No JVD.  RRR. Respiratory: Normal effort.  No stridor.  Bilateral clear to auscultation. GI: Non-distended.  BS +. Skin: Warm and dry.  Intact. Psych: Normal mood.  Normal behavior. Musc: Mild edema LUE/LLE.  No tenderness in extremities. Tenderness to palpation over right ATFL. Nontender over malleolus or navicular. Pain with bearing weight Neuro: Alert and oriented x3 Dysarthria, stable Motor: LUE: 0/5 proximal to distal LLE: HF, KE 2-/5, ADF 1/5, unchanged Left inattention RUE/RLE: 5/5 proximal distal Sensation intact light touch  Assessment/Plan: 1. Functional deficits which require 3+ hours per day of interdisciplinary therapy in a comprehensive inpatient rehab setting.  Physiatrist is providing close team supervision and 24 hour  management of active medical problems listed below.  Physiatrist and rehab team continue to assess barriers to discharge/monitor patient progress toward functional and medical goals   Care Tool:  Bathing    Body parts bathed by patient: Chest,Abdomen,Left arm,Face,Front perineal area,Right lower leg,Left lower leg   Body parts bathed by helper: Right arm,Buttocks     Bathing assist Assist Level: Moderate Assistance - Patient 50 - 74% (using LH sponge)     Upper Body Dressing/Undressing Upper body dressing   What is the patient wearing?: Pull over shirt    Upper body assist Assist Level: Maximal Assistance - Patient 25 - 49%    Lower Body Dressing/Undressing Lower body dressing      What is the patient wearing?: Incontinence brief,Pants     Lower body assist Assist for lower body dressing: 2 Helpers (using Stedy for sit<stand)     08/26/2020 assist Assist for toileting: 2 Helpers     Transfers Chair/bed transfer  Transfers assist     Chair/bed transfer assist level: 2 Helpers     Locomotion Ambulation   Ambulation assist   Ambulation activity did not occur: Safety/medical concerns          Walk 10 feet activity   Assist  Walk 10 feet activity did not occur: Safety/medical concerns        Walk 50 feet activity   Assist Walk 50 feet with 2 turns activity did not occur: Safety/medical concerns  Walk 150 feet activity   Assist Walk 150 feet activity did not occur: Safety/medical concerns         Walk 10 feet on uneven surface  activity   Assist Walk 10 feet on uneven surfaces activity did not occur: Safety/medical concerns         Wheelchair     Assist Will patient use wheelchair at discharge?:  (TBD)             Wheelchair 50 feet with 2 turns activity    Assist            Wheelchair 150 feet activity     Assist           Medical Problem List and  Plan: 1.Left hemiparesis and functional deficitssecondary to right thalamic hemorrhage with intraventricular extension. Course complicated by seizure  Continue CIR   WHO/PRAFO nightly 2. Antithrombotics: -DVT/anticoagulation:Pharmaceutical:Lovenox -antiplatelet therapy: N/A due to bleed 3.Ongoing headaches/pain Management:Continue Depakote and Keppra.  Topamax 25 daily started on 6/2 with benefit 4. Mood:LCSW to follow for evaluation and support -antipsychotic agents: N/AA 5. Neuropsych: This patientis intermittentlycapable of making decisions onhisown behalf. 6. Skin/Wound Care:Routine pressure-relief measures. 7. Fluids/Electrolytes/Nutrition:Monitor intake/output.  9.Partial complex seizure: Continue Keppra 500mg  twice daily. 10.HTN: Monitor blood pressures 3 times daily.  --Continue amlodipine, labetalol, losartan and clonidine -SBP goal of <160 given ICH  ?  Trending up, will consider medication adjustments if persistent  Monitor with increased exertion 11.Dyslipidemia: On statin. 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 Right ankle pain: suspect sprain given tenderness over ATFL. Ice 15mg  TID. Given chronicity of pain and pain with weightbearing, ordered right ankle XR- patient agreeable. Voltaren gel ordered. Requested RN soak foot in epsom salt if available. Ankle support brace ordered.   LOS: 4 days A FACE TO FACE EVALUATION WAS  PERFORMED  Wednesday P Emelia Sandoval 06/26/2020, 2:15 PM

## 2020-06-26 NOTE — Progress Notes (Signed)
Speech Language Pathology Daily Session Note  Patient Details  Name: [name] MRN: [id number] Date of Birth: 10/31/1965  Today's Date: 06/26/2020 SLP Individual Time: 1030-1100 SLP Individual Time Calculation (min): 30 min  Short Term Goals: Week 1: SLP Short Term Goal 1 (Week 1): Patient will consume current diet with minimal overt s/s of aspiration with Supervision verbal cues for use of swallowing compensatory strategies. SLP Short Term Goal 2 (Week 1): Patient will demonstrate efficient mastication with minimal oral residue without overt s/s of aspiration with supervision level verbal cues over 2 sessions prior to upgrade. SLP Short Term Goal 3 (Week 1): Patient will recall new, daily information with Mod verbal and visual cues. SLP Short Term Goal 4 (Week 1): Patient will demonstrate sustained attention to functional tasks for 15 minutes with Min verbal cues for redirection. SLP Short Term Goal 5 (Week 1): Patient will demonstrate functional problem solving for basic and familiar tasks with Min verbal cues. SLP Short Term Goal 6 (Week 1): Patient will identify 2 physical and 2 cognitive changes with Min verbal cues.  Skilled Therapeutic Interventions:   Patient seen for skilled ST session focusing on cognitive-linguistic goals. When SLP returned to room (patient getting NT care; clean up in bed) patient was in bed with head turned to right and was talking as if to someone in room. When SLP asked who he was talking to, he said, "I thought I was talking to you". SLP suspects that patient was unaware when NT left room. He was agreeable to session and after SLP raised HOB and placed tray table in front of him, he participated in task of placing color pegs on pegboard to match photo. Initially, patient had no awareness to missing entire left side of peg design and required mod-maxA. During second trial, patient improved to requiring min-modA. Patient asked about exercises and SLP told him about  continuing to think about looking to left and that his room was setup with door on left side for a reason. Patient then stated, "Maybe I'll have my wife stand on my left and put up some fingers". Patient continues to benefit from skilled SLP intervention to maximize cognitive-linguistic and swallow function prior to discharge.   Pain Pain Assessment Pain Scale: 0-10 Faces Pain Scale: No hurt  Therapy/Group: Individual Therapy  Angela Nevin, MA, CCC-SLP Speech Therapy

## 2020-06-27 MED ORDER — NAPHAZOLINE-PHENIRAMINE 0.025-0.3 % OP SOLN
1.0000 [drp] | Freq: Four times a day (QID) | OPHTHALMIC | Status: DC | PRN
  Administered 2020-06-28 – 2020-07-10 (×8): 1 [drp] via OPHTHALMIC
  Filled 2020-06-27 (×2): qty 15

## 2020-06-27 MED ORDER — CAPSAICIN 0.025 % EX CREA
TOPICAL_CREAM | Freq: Two times a day (BID) | CUTANEOUS | Status: DC
  Filled 2020-06-27: qty 60

## 2020-06-27 MED ORDER — HYDROCERIN EX CREA
TOPICAL_CREAM | Freq: Two times a day (BID) | CUTANEOUS | Status: DC
  Administered 2020-07-02 – 2020-07-03 (×2): 1 via TOPICAL
  Filled 2020-06-27: qty 113

## 2020-06-27 MED ORDER — CAPSAICIN 0.075 % EX CREA
TOPICAL_CREAM | Freq: Two times a day (BID) | CUTANEOUS | Status: DC
  Filled 2020-06-27: qty 60

## 2020-06-27 NOTE — Progress Notes (Signed)
Physical Therapy Session Note  Patient Details  Name: [name] MRN: [id number] Date of Birth: 1965/12/21  Today's Date: 06/26/2020 PT Individual Time:  1304-1403  PT Individual Time Calculation: 59 min   Short Term Goals: Week 1:  PT Short Term Goal 1 (Week 1): Pt will perform rolling R/L in bed with min assist PT Short Term Goal 2 (Week 1): Pt will perform supine<>sit with mod assist PT Short Term Goal 3 (Week 1): Pt wil perform sit<>stands using LRAD (but not mechanical lift) with mod assist PT Short Term Goal 4 (Week 1): Pt will initiate gait training  Skilled Therapeutic Interventions/Progress Updates:  Patient supine in bed on entrance to room. Patient alert and agreeable to PT session. Patient denied pain during session.  Therapeutic Activity: Bed Mobility: Patient performed supine --> sit with heavy vc/ tc required for technique and completes with Mod A mainly for pushing UB to seated position on EOB. At end of session, pt fatigued but is able to return to supine form seated position on EOB with Min A and heavy vc for lifting LLE to bed surface. Mod A +2 with assist from pt to complete positioning toward HOB. Transfers: Patient performed STS transfer in STEDY in order to complete toileting at toilet. Is able to rise to stand and control descent to sit using STEDY with Min A +2. Provided verbal cues for initiation and sequence/ technique. Once seated on toilet, assisted with placement and proper hold of urinal Min A. Pericare Max A.   Neuromuscular Re-ed: NMR facilitated during session with focus on sitting and standing balance. Challenged in sitting balance with handheld pressures into AP and lateral directions. Pt able to with stand min to mod pressures. Pt guided in lateral flexion and return to midline with no physical assist. Standing balance and motor control challenged with STS to RW from bedside. Performed x3 with L knee block and heavy Mod A +2 to complete with push-to-stand  technique. Pt nervous with different technique in hand placement, but able to complete with +2 assist in maintaining stance. Glute and QL activation noted with full upright standing.  NMR performed for improvements in motor control and coordination, balance, sequencing, judgement, and self confidence/ efficacy in performing all aspects of mobility at highest level of independence.   Patient supine in bed at end of session with brakes locked, bed alarm set, and all needs within reach.  Therapy Documentation Precautions:  Precautions Precautions: Fall Precaution Comments: Lt hemi with right gaze preference Restrictions Weight Bearing Restrictions: No  Therapy/Group: Individual Therapy  Loel Dubonnet PT, DPT 06/26/2020, 7:49 PM

## 2020-06-27 NOTE — Progress Notes (Signed)
[facility] PHYSICAL MEDICINE & REHABILITATION PROGRESS NOTE  Subjective/Complaints: Continues to complain of right ankle pain. Discussed XR results with patient and wife: no evidence of fracture or athropathy. Tender to palpation to ATFL, discussed likely sprain, requested RN to apply ice, explained rationale for brace  ROS: Denies CP, SOB, N/V/D, +right ankle pain  Objective: Vital Signs: Blood pressure 119/74, pulse 78, temperature 98.3 F (36.8 C), temperature source Oral, resp. rate 14, height 5\' 10"  (1.778 m), weight 105.4 kg, SpO2 97 %. DG Ankle 2 Views Right  Result Date: 06/26/2020 CLINICAL DATA:  Pain EXAM: RIGHT ANKLE - 2 VIEW COMPARISON:  None. FINDINGS: Frontal and lateral views were obtained. No fracture or joint effusion. No appreciable joint space narrowing or erosion. Ankle mortise appears intact. IMPRESSION: No fracture or appreciable arthropathy. Ankle mortise appears intact. Electronically Signed   By: 08/26/2020 III M.D.   On: 06/26/2020 14:31   No results for input(s): WBC, HGB, HCT, PLT in the last 72 hours. No results for input(s): NA, K, CL, CO2, GLUCOSE, BUN, CREATININE, CALCIUM in the last 72 hours.  Intake/Output Summary (Last 24 hours) at 06/27/2020 1125 Last data filed at 06/26/2020 1700 Gross per 24 hour  Intake 236 ml  Output --  Net 236 ml        Physical Exam: BP 119/74 (BP Location: Right Arm)   Pulse 78   Temp 98.3 F (36.8 C) (Oral)   Resp 14   Ht 5\' 10"  (1.778 m)   Wt 105.4 kg   SpO2 97%   BMI 33.34 kg/m   Gen: no distress, normal appearing, Obese. HEENT: oral mucosa pink and moist, NCAT, Left eye lags during visual tracking (chronic). Cardio: Reg rate Chest: normal effort, normal rate of breathing Abd: soft, non-distended Ext: no edema Psych: pleasant, normal affect Musc: Mild edema LUE/LLE.  No tenderness in extremities. Tenderness to palpation over right ATFL. Nontender over malleolus or navicular. Pain with bearing  weight Neuro: Alert and oriented x3 Dysarthria, stable Motor: LUE: 0/5 proximal to distal LLE: HF, KE 2-/5, ADF 1/5, unchanged Left inattention RUE/RLE: 5/5 proximal distal Sensation intact light touch   Assessment/Plan: 1. Functional deficits which require 3+ hours per day of interdisciplinary therapy in a comprehensive inpatient rehab setting.  Physiatrist is providing close team supervision and 24 hour management of active medical problems listed below.  Physiatrist and rehab team continue to assess barriers to discharge/monitor patient progress toward functional and medical goals   Care Tool:  Bathing    Body parts bathed by patient: Chest,Abdomen,Left arm,Face,Front perineal area,Right lower leg,Left lower leg   Body parts bathed by helper: Right arm,Buttocks     Bathing assist Assist Level: Moderate Assistance - Patient 50 - 74% (using LH sponge)     Upper Body Dressing/Undressing Upper body dressing   What is the patient wearing?: Pull over shirt    Upper body assist Assist Level: Maximal Assistance - Patient 25 - 49%    Lower Body Dressing/Undressing Lower body dressing      What is the patient wearing?: Incontinence brief,Pants     Lower body assist Assist for lower body dressing: 2 Helpers (using Stedy for sit<stand)     08/26/2020 assist Assist for toileting: 2 Helpers     Transfers Chair/bed transfer  Transfers assist     Chair/bed transfer assist level: 2 Helpers     Locomotion Ambulation   Ambulation assist   Ambulation activity did not occur:  Safety/medical concerns          Walk 10 feet activity   Assist  Walk 10 feet activity did not occur: Safety/medical concerns        Walk 50 feet activity   Assist Walk 50 feet with 2 turns activity did not occur: Safety/medical concerns         Walk 150 feet activity   Assist Walk 150 feet activity did not occur: Safety/medical concerns          Walk 10 feet on uneven surface  activity   Assist Walk 10 feet on uneven surfaces activity did not occur: Safety/medical concerns         Wheelchair     Assist Will patient use wheelchair at discharge?:  (TBD)             Wheelchair 50 feet with 2 turns activity    Assist            Wheelchair 150 feet activity     Assist           Medical Problem List and Plan: 1.Left hemiparesis and functional deficitssecondary to right thalamic hemorrhage with intraventricular extension. Course complicated by seizure  Continue CIR   WHO/PRAFO nightly 2. Antithrombotics: -DVT/anticoagulation:Pharmaceutical:Lovenox -antiplatelet therapy: N/A due to bleed 3.Ongoing headaches/pain Management:Continue Depakote and Keppra.  Topamax 25 daily started on 6/2 with benefit 4. Mood:LCSW to follow for evaluation and support -antipsychotic agents: N/AA 5. Neuropsych: This patientis intermittentlycapable of making decisions onhisown behalf. 6. Skin/Wound Care:Routine pressure-relief measures. Eucerin ordered for dry skin. Recommended wife bring coconut oil from home.  7. Fluids/Electrolytes/Nutrition:Monitor intake/output. Discussed anti-inflammatory diet, replacing ginger ale with water during the day and tart cherry juice at night, cranberry juice if he really wants juice during the day. 9.Partial complex seizure: Continue Keppra 500mg  twice daily. 10.HTN: Monitor blood pressures 3 times daily.  --Continue amlodipine, labetalol, losartan and clonidine -SBP goal of <160 given ICH  ?  Trending up, will consider medication adjustments if persistent  Monitor with increased exertion 11.Dyslipidemia: On statin. 12.Acute kidney injury:   Creatinine 1.26 on 6/1, repeat Monday  Encourage fluids  Continue to monitor 13.History of alcohol abuse/delirium:Continue Seroquel 25 mg at bedtime to help with  sleep/wake cycle. 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 Right ankle pain: suspect sprain given tenderness over ATFL. Ice 15mg  TID. Given chronicity of pain and pain with weightbearing, ordered right ankle XR- discussed with wife that there is no evidence of fracture or athropathy. Voltaren gel ordered with minimal benefit; capsaicin also ordered to see if this provides some relief. Requested RN soak foot in epsom salt if available. Ankle support brace ordered and patient wearing. Advised to elevate when resting, minimize weightbearing while with therapy to allow ligament to heal. Provided list of anti-inflammatory foods for pain. Discussed risks and benefits of various medications such as tylenol, NSAIDs, and prednisone and patient and wife decided on above conservative treatment and tylenol for now.    >35 minutes spent in discussion of ankle injury, review of XR with family, and discussion of management, discussion of home environment and requesting wife take dimensions of tub and doorway, discussion of anti-inflammatory diet, AKI, and replacing ginger ale with water/more nutritious juices  if needed, answering patient and family's questions, all time spent in direct patient care  LOS: 5 days A FACE TO FACE EVALUATION WAS PERFORMED  Clint Bolder P Jenilyn Magana 06/27/2020, 11:25 AM

## 2020-06-28 LAB — CBC WITH DIFFERENTIAL/PLATELET
Abs Immature Granulocytes: 0.04 10*3/uL (ref 0.00–0.07)
Basophils Absolute: 0 10*3/uL (ref 0.0–0.1)
Basophils Relative: 0 %
Eosinophils Absolute: 0.1 10*3/uL (ref 0.0–0.5)
Eosinophils Relative: 2 %
HCT: 36.6 % — ABNORMAL LOW (ref 39.0–52.0)
Hemoglobin: 11.8 g/dL — ABNORMAL LOW (ref 13.0–17.0)
Immature Granulocytes: 1 %
Lymphocytes Relative: 27 %
Lymphs Abs: 1.8 10*3/uL (ref 0.7–4.0)
MCH: 30.6 pg (ref 26.0–34.0)
MCHC: 32.2 g/dL (ref 30.0–36.0)
MCV: 94.8 fL (ref 80.0–100.0)
Monocytes Absolute: 0.8 10*3/uL (ref 0.1–1.0)
Monocytes Relative: 12 %
Neutro Abs: 3.9 10*3/uL (ref 1.7–7.7)
Neutrophils Relative %: 58 %
Platelets: 458 10*3/uL — ABNORMAL HIGH (ref 150–400)
RBC: 3.86 MIL/uL — ABNORMAL LOW (ref 4.22–5.81)
RDW: 11.8 % (ref 11.5–15.5)
WBC: 6.7 10*3/uL (ref 4.0–10.5)
nRBC: 0 % (ref 0.0–0.2)

## 2020-06-28 LAB — CBC
HCT: 34.6 % — ABNORMAL LOW (ref 39.0–52.0)
Hemoglobin: 11.4 g/dL — ABNORMAL LOW (ref 13.0–17.0)
MCH: 31.4 pg (ref 26.0–34.0)
MCHC: 32.9 g/dL (ref 30.0–36.0)
MCV: 95.3 fL (ref 80.0–100.0)
Platelets: 412 10*3/uL — ABNORMAL HIGH (ref 150–400)
RBC: 3.63 MIL/uL — ABNORMAL LOW (ref 4.22–5.81)
RDW: 11.6 % (ref 11.5–15.5)
WBC: 6 10*3/uL (ref 4.0–10.5)
nRBC: 0 % (ref 0.0–0.2)

## 2020-06-28 LAB — BASIC METABOLIC PANEL
Anion gap: 9 (ref 5–15)
BUN: 14 mg/dL (ref 6–20)
CO2: 24 mmol/L (ref 22–32)
Calcium: 9.6 mg/dL (ref 8.9–10.3)
Chloride: 105 mmol/L (ref 98–111)
Creatinine, Ser: 1.18 mg/dL (ref 0.61–1.24)
GFR, Estimated: >60 mL/min (ref >60)
Glucose, Bld: 96 mg/dL (ref 70–99)
Potassium: 4.1 mmol/L (ref 3.5–5.1)
Sodium: 138 mmol/L (ref 135–145)

## 2020-06-28 NOTE — Progress Notes (Signed)
[facility] PHYSICAL MEDICINE & REHABILITATION PROGRESS NOTE  Subjective/Complaints:  RIgh tankle doing better  Has Left hand swelling but no hand pain, has some shoulder pain when he lays on that side   ROS: Denies CP, SOB, N/V/D, +right ankle pain  Objective: Vital Signs: Blood pressure (!) 142/79, pulse 68, temperature 97.9 F (36.6 C), temperature source Oral, resp. rate 14, height 5\' 10"  (1.778 m), weight 105.4 kg, SpO2 99 %. DG Ankle 2 Views Right  Result Date: 06/26/2020 CLINICAL DATA:  Pain EXAM: RIGHT ANKLE - 2 VIEW COMPARISON:  None. FINDINGS: Frontal and lateral views were obtained. No fracture or joint effusion. No appreciable joint space narrowing or erosion. Ankle mortise appears intact. IMPRESSION: No fracture or appreciable arthropathy. Ankle mortise appears intact. Electronically Signed   By: 08/26/2020 III M.D.   On: 06/26/2020 14:31   Recent Labs    06/28/20 0700  WBC 6.0  HGB 11.4*  HCT 34.6*  PLT 412*   Recent Labs    06/28/20 0700  NA 138  K 4.1  CL 105  CO2 24  GLUCOSE 96  BUN 14  CREATININE 1.18  CALCIUM 9.6    Intake/Output Summary (Last 24 hours) at 06/28/2020 0946 Last data filed at 06/28/2020 0905 Gross per 24 hour  Intake 118 ml  Output 150 ml  Net -32 ml        Physical Exam: BP (!) 142/79 (BP Location: Left Arm)   Pulse 68   Temp 97.9 F (36.6 C) (Oral)   Resp 14   Ht 5\' 10"  (1.778 m)   Wt 105.4 kg   SpO2 99%   BMI 33.34 kg/m    General: No acute distress Mood and affect are appropriate Heart: Regular rate and rhythm no rubs murmurs or extra sounds Lungs: Clear to auscultation, breathing unlabored, no rales or wheezes Abdomen: Positive bowel sounds, soft nontender to palpation, nondistended Extremities: No clubbing, cyanosis, or edema Skin: No evidence of breakdown, no evidence of rash  Sensory exam normal sensationabsent  light touch  In Left  upper and lower extremities Cerebellar exam normal finger to nose to  finger as well as heel to shin in bilateral upper and lower extremities Musculoskeletal: Full range of motion in all 4 extremities. No joint swelling  Neuro: Alert and oriented x3 Dysarthria, stable Motor: LUE: 0/5 proximal to distal LLE: HF, KE 2-/5, ADF 1/5, unchanged Left inattention RUE/RLE: 5/5 proximal distal Sensation intact light touch   Assessment/Plan: 1. Functional deficits which require 3+ hours per day of interdisciplinary therapy in a comprehensive inpatient rehab setting.  Physiatrist is providing close team supervision and 24 hour management of active medical problems listed below.  Physiatrist and rehab team continue to assess barriers to discharge/monitor patient progress toward functional and medical goals   Care Tool:  Bathing    Body parts bathed by patient: Chest,Abdomen,Left arm,Face,Front perineal area,Right lower leg,Left lower leg   Body parts bathed by helper: Right arm,Buttocks     Bathing assist Assist Level: Moderate Assistance - Patient 50 - 74% (using LH sponge)     Upper Body Dressing/Undressing Upper body dressing   What is the patient wearing?: Pull over shirt    Upper body assist Assist Level: Maximal Assistance - Patient 25 - 49%    Lower Body Dressing/Undressing Lower body dressing      What is the patient wearing?: Incontinence brief,Pants     Lower body assist Assist for lower body dressing: 2 Helpers (using 08/28/2020  for sit<stand)     Toileting Toileting    Toileting assist Assist for toileting: 2 Helpers     Transfers Chair/bed transfer  Transfers assist     Chair/bed transfer assist level: 2 Helpers     Locomotion Ambulation   Ambulation assist   Ambulation activity did not occur: Safety/medical concerns          Walk 10 feet activity   Assist  Walk 10 feet activity did not occur: Safety/medical concerns        Walk 50 feet activity   Assist Walk 50 feet with 2 turns activity did not occur:  Safety/medical concerns         Walk 150 feet activity   Assist Walk 150 feet activity did not occur: Safety/medical concerns         Walk 10 feet on uneven surface  activity   Assist Walk 10 feet on uneven surfaces activity did not occur: Safety/medical concerns         Wheelchair     Assist Will patient use wheelchair at discharge?:  (TBD)             Wheelchair 50 feet with 2 turns activity    Assist            Wheelchair 150 feet activity     Assist           Medical Problem List and Plan: 1.Left hemiparesis and functional deficitssecondary to right thalamic hemorrhage with intraventricular extension. Course complicated by seizure  Continue CIR PT, OT, SLP  WHO/PRAFO nightly 2. Antithrombotics: -DVT/anticoagulation:Pharmaceutical:Lovenox -antiplatelet therapy: N/A due to bleed 3.Ongoing headaches/pain Management:Continue Depakote and Keppra.  Topamax 25 daily started on 6/2 with benefit 4. Mood:LCSW to follow for evaluation and support -antipsychotic agents: N/AA 5. Neuropsych: This patientis intermittentlycapable of making decisions onhisown behalf. 6. Skin/Wound Care:Routine pressure-relief measures. Eucerin ordered for dry skin. Recommended wife bring coconut oil from home.  7. Fluids/Electrolytes/Nutrition:Monitor intake/output. Discussed anti-inflammatory diet, replacing ginger ale with water during the day and tart cherry juice at night, cranberry juice if he really wants juice during the day. 9.Partial complex seizure: Continue Keppra 500mg  twice daily. 10.HTN: Monitor blood pressures 3 times daily.  --Continue amlodipine, labetalol, losartan and clonidine -SBP goal of <160 given ICH  Still in range Vitals:   06/27/20 1913 06/28/20 0533  BP: 134/84 (!) 142/79  Pulse: 73 68  Resp: 14 14  Temp: 98 F (36.7 C) 97.9 F (36.6 C)  SpO2: 100% 99%    11.Dyslipidemia: On statin. 12.Acute kidney injury:   Resolved  13.History of alcohol abuse/delirium:Continue Seroquel 25 mg at bedtime to help with sleep/wake cycle. 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 Right ankle pain: suspect sprain given tenderness over ATFL. Ice 15mg  TID. Given chronicity of pain and pain with weightbearing, ordered right ankle XR- discussed with wife that there is no evidence of fracture or athropathy. Voltaren gel ordered with minimal benefit; capsaicin also ordered to see if this provides some relief. Requested RN soak foot in epsom salt if available. Ankle support brace ordered and patient wearing. Advised to elevate when resting, minimize weightbearing while with therapy to allow ligament to heal. Provided list of anti-inflammatory foods for pain. Discussed risks and  benefits of various medications such as tylenol, NSAIDs, and prednisone and patient and wife decided on above conservative treatment and tylenol for now.   18.  Left shoulder pain exam is neg has hLeft hand swelling but no pain - at this time does not appear to be shoulder hand syndrome , avoid sleeping on left , Retyrograde massage to Left hand , may consiser removing wrist splint during the day   LOS: 6 days A FACE TO FACE EVALUATION WAS PERFORMED  Erick Colace 06/28/2020, 9:46 AM

## 2020-06-28 NOTE — Progress Notes (Signed)
Speech Language Pathology Daily Session Note  Patient Details  Name: [name] MRN: [id number] Date of Birth: [birth year]  Today's Date: 06/28/2020 SLP Individual Time: 1300-1400 SLP Individual Time Calculation (min): 60 min  Short Term Goals: Week 1: SLP Short Term Goal 1 (Week 1): Patient will consume current diet with minimal overt s/s of aspiration with Supervision verbal cues for use of swallowing compensatory strategies. SLP Short Term Goal 2 (Week 1): Patient will demonstrate efficient mastication with minimal oral residue without overt s/s of aspiration with supervision level verbal cues over 2 sessions prior to upgrade. SLP Short Term Goal 3 (Week 1): Patient will recall new, daily information with Mod verbal and visual cues. SLP Short Term Goal 4 (Week 1): Patient will demonstrate sustained attention to functional tasks for 15 minutes with Min verbal cues for redirection. SLP Short Term Goal 5 (Week 1): Patient will demonstrate functional problem solving for basic and familiar tasks with Min verbal cues. SLP Short Term Goal 6 (Week 1): Patient will identify 2 physical and 2 cognitive changes with Min verbal cues.  Skilled Therapeutic Interventions: Pt seen for skilled ST with focus on cognitive and dysphagia goals. Pt had just finished lunch meal and had residual food on L chin. SLP and pt reviewing swallow precautions as trained including checking for anterior loss and lingual/finger sweep in L buccal area. Pt agreeable to regular trials, mild prolonged mastication and decreased bolus prep, no overt s/s aspiration or significant pocketing. Pt was able to implement safe swallow strategies with Supervision level cues. Pt continues to endorse memory changes, spoke with wife on phone who notes patient more confused in the AM when he wakes and pt reports difficulty with memory in the evening due to lethargy. SLP facilitating moderately complex immediate memory task by providing min A verbal  cues for 75% accuracy. Pt demonstrates improving awareness of current cognitive and physical impairments, will benefit from ongoing training and education. Pt states this is a good opportunity "to make lifestyle changes". Pt left in wheelchair with alarm belt on and all needs within reach. Cont ST POC.   Pain Pain Assessment Pain Scale: 0-10 Pain Score: 0-No pain Pain Type: Acute pain Pain Location: Ankle Pain Orientation: Right Pain Descriptors / Indicators: Aching Pain Intervention(s): Medication (See eMAR)  Therapy/Group: Individual Therapy  Tacey Ruiz 06/28/2020, 3:08 PM

## 2020-06-28 NOTE — Progress Notes (Signed)
Physical Therapy Session Note  Patient Details  Name: [name] MRN: [id number] Date of Birth: [birth year]  Today's Date: 06/28/2020 PT Individual Time: 1000-1100 PT Individual Time Calculation (min): 60 min   Short Term Goals: Week 1:  PT Short Term Goal 1 (Week 1): Pt will perform rolling R/L in bed with min assist PT Short Term Goal 2 (Week 1): Pt will perform supine<>sit with mod assist PT Short Term Goal 3 (Week 1): Pt wil perform sit<>stands using LRAD (but not mechanical lift) with mod assist PT Short Term Goal 4 (Week 1): Pt will initiate gait training  Skilled Therapeutic Interventions/Progress Updates:    Pt received supine in bed, agreeable to PT tx. Per chart review, pt with R ankle sprain being treated conservatively with bracing, ice, and elevation, WBAT. Pt with R ankle support brace on throughout session. He reports no resting R ankle pain but does report pain when weightbearing, rated as 4/10. Donned athletic shorts in bed with modA with assist for threading LE's and pulling over hips - pt able to assist with this via single leg bridge technique with R foot. Donned tennis shoes with totalA for time management. Supine<>sit requiring maxA for trunk and BLE management and requires minA for sitting balance due to posterior bias. Provided him Stedy where he completed sit<>stand in stedy with minA but needing assist for stabilizing L knee to reduce "frog leg" positioning. Able to sit in perched position with CGA on Stedy and transferred to TIS w/c. Wheeled in TIS w/c to main rehab gym. Completed additional Stedy transfer in similar manner to mat table. While sitting on mat table, working on dynamic sitting balance with cone reaching with LUE over midline to his R side, promoting L attention and LLE weight bearing. Then completed additional Stedy transfer to w/c with +2 minA to his TIS w/c to focus remainder of session on pre-gait training in // bars. Sit<>stand in // bar with L knee  block and modA. Worked on static standing in // bars with min/modA for balance and L knee block - completed lateral weight shifting L<>R as well as forward/backward stepping with his stronger R foot to visual target while maintaining L knee block. Pt did not c/o worsening R ankle during weight bearing activities. Pt returned to room at end of session and remained reclined in TIS w/c with safety belt alarm on and needs in reach - propped LLE on armrest chair for positioning (no LLE leg rest available) and pt made comfortable for upcoming OT session.  Therapy Documentation Precautions:  Precautions Precautions: Fall Precaution Comments: Lt hemi with right gaze preference Restrictions Weight Bearing Restrictions: No General:    Therapy/Group: Individual Therapy  Maleko Greulich P Eleanna Theilen PT 06/28/2020, 7:41 AM

## 2020-06-28 NOTE — Progress Notes (Addendum)
Occupational Therapy Session Note  Patient Details  Name: [name] MRN: [id number] Date of Birth: 09-21-1965  Today's Date: 06/28/2020 OT Individual Time: 1100-1200; and 1400-1430 OT Individual Time Calculation (min): 60 min ; and 30 min   Short Term Goals: Week 1:  OT Short Term Goal 1 (Week 1): Pt will be able to complete stedy transfer to toilet with min A of 1. OT Short Term Goal 2 (Week 1): Pt will be able to stand in stedy with min A while using R hand to adjust clothing over hips with min A. OT Short Term Goal 3 (Week 1): Pt will don shirt with min A. OT Short Term Goal 4 (Week 1): Pt will be able to self mobilize LUE with min A. OT Short Term Goal 5 (Week 1): Pt will be able to self feed with supervision.  Skilled Therapeutic Interventions/Progress Updates:    Pt reports 5/10 pain in right ankle and requesting ice application and to take a break from standing.  OT provided cold pack and applied to affected area.  Nurse made aware of pts pain level and location.  Skin intact prior and after cold pack application.  Session focused on seated neuro re-ed of LUE to allow for resting and elevating right ankle.  Assessed sensation of LUE and noted no feeling shoulder to finger tip volarly and dorsally, therefore NMES contraindicated at this time.  Provided tactile facilitatory cues to desired muscle belly to recruit during each movement.  Pt completed shoulder ER/IR and horizontal add/abduction, digital flexion/extension in gravity eliminated plane through manual positioning by OT. Also provided functional component to reach/grasp/bring cup to mouth.  Towel glides on table top with active assist movements and external target provided to promote shoulder forward flexion/extension and elbow flexion/extension with shoulder IR/ER.  Pt needed max assist for all table top movements today, exhibiting reduced motor control and increased inattention to left arm.    Pt requesting to brush his teeth.   Facilitated LUE weightbearing through elbow and shoulder on arm rest during forward lean while completing oral hygiene to increase proprioceptive input to extremtiy.  Also provided hand over hand to complete active assist left hand pinch and grasp of toothpaste to squeeze onto toothbrush with VCs provided to promote left sided attention.  Pt tilted posteriorly, call bell in reach, seat belt alarm donned at end of session.  Second session: Pt reports his right ankle still hurting him a lot, 5/10 pain and requesting ice application again and to refrain from standing. Pt also reports has not been wearing his left resting hand splint at night and is unable to donn himself. OT repositioned pts BLE for increased comfort and to decrease pain using bilateral foot rests at TIS w/c.  Nurse notified of pts pain in right ankle persisting.  Total dependent to doff right ankle brace and OT assessed appearance noting mild to moderate swelling right ankle, however skin intact with normal coloring. OT educated nursing by placing visual instructions/reminder handout above pts bed for donning of left RHS and safe left hemi positioning at chair and bed level.  Pt with nurse to further address right ankle pain at end of session.  Therapy Documentation Precautions:  Precautions Precautions: Fall Precaution Comments: Lt hemi with right gaze preference Restrictions Weight Bearing Restrictions: No   Therapy/Group: Individual Therapy  Amie Critchley 06/28/2020, 12:25 PM

## 2020-06-28 NOTE — Progress Notes (Signed)
Patient ID: [name], male   DOB: 02/14/1965, 55 y.o.   MRN: [id number]  Spoke with wife via telephone who reports has FMLA forms that need to be completed. She will drop off with Angel-RN who will get to this worker to get completed.

## 2020-06-29 NOTE — Progress Notes (Signed)
Occupational Therapy Session Note  Patient Details  Name: [name] MRN: [id number] Date of Birth: 1965-02-17  Today's Date: 06/29/2020 OT Individual Time: 2248-2500 OT Individual Time Calculation (min): 55 min    Short Term Goals: Week 1:  OT Short Term Goal 1 (Week 1): Pt will be able to complete stedy transfer to toilet with min A of 1. OT Short Term Goal 2 (Week 1): Pt will be able to stand in stedy with min A while using R hand to adjust clothing over hips with min A. OT Short Term Goal 3 (Week 1): Pt will don shirt with min A. OT Short Term Goal 4 (Week 1): Pt will be able to self mobilize LUE with min A. OT Short Term Goal 5 (Week 1): Pt will be able to self feed with supervision.  Skilled Therapeutic Interventions/Progress Updates:    Pt in bed reporting 4/10 pain in right ankle and stating "the doctor said the only way my ankle will heal is if I stay off of it".  Pt refusing OOB at this time, therefore clarified weight bearing status with PA, Pam.  Per PA instruction, pt allowed to weight bear and complete ROM/therex in right ankle and foot.  After encouragement provided by PA and OT, pt agreeable to participating during session.  Max assist right roll and right sidelying to sit EOB.  Pt required total assist to donn shoes with second person guarding on left side to prevent unsafe left lean.  Pt completed sit<>stand at stedy with min assist and mod VCs to facilitate righting to midline and upright posture.  Transported to TIS w/c using stedy.   Pt educated on weight shifting techniques forward and laterally to reposition pelvis in neutral requiring max multimodal cues and mod assist to successfully reposition.  Pt requesting to brush teeth stating "my breath smells so bad".  Neuro re-ed provided during oral hygiene sitting sinkside providing manual active assist and tactile/verbal cues to facilitate desired functional movements at LUE to squeeze toothpaste onto toothbrush and bring cup  to mouth when rinsing.  Also provided weightbearing manual support to LUE through elbow and shoulder when brushing teeth with RUE.   OT noting moderate swelling in left wrist and hand therefore applied kinesiotaping for edema reduction purposes.  Positioned LUE on pillows in elevated position as well. Applied cold pack to right ankle in elevated position to decrease pain and swelling at end of session.  Call bell in reach, seat belt alarm on.  Therapy Documentation Precautions:  Precautions Precautions: Fall Precaution Comments: Lt hemi with right gaze preference Restrictions Weight Bearing Restrictions: No   Therapy/Group: Individual Therapy  Amie Critchley 06/29/2020, 3:39 PM

## 2020-06-29 NOTE — Patient Care Conference (Signed)
Inpatient RehabilitationTeam Conference and Plan of Care Update Date: 06/29/2020   Time: 13:36 PM    Patient Name: [name]      Medical Record Number: [id number]  Date of Birth: [birth year] Sex: Male         Room/Bed: 4W18C/4W18C-01 Payor Info: Payor: [employer] / Plan: [employer] NAP / Product Type: *No Product type* /    Admit Date/Time:  06/22/2020  2:56 PM  Primary Diagnosis:  Thalamic hemorrhage [facility])  Hospital Problems: Principal Problem:   Thalamic hemorrhage (HCC) Active Problems:   Acute blood loss anemia   AKI (acute kidney injury) (HCC)   Essential hypertension   Seizures (HCC)   Vascular headache   Dysphagia, post-stroke    Expected Discharge Date: Expected Discharge Date: 07/14/20  Team Members Present: Physician leading conference: Dr. Sula Soda Care Coodinator Present: Chana Bode, RN, BSN, CRRN;Becky Dupree, LCSW Nurse Present: Chana Bode, RN PT Present: Midge Minium, PT;Christian Manhard, PT OT Present: Roney Mans, OT SLP Present: Colin Benton, SLP PPS Coordinator present : Edson Snowball, PT     Current Status/Progress Goal Weekly Team Focus  Bowel/Bladder   incont. B/B, occasionaly cont of bladder. last BM-6/5  Pt to gain continence  assess B/B q shift and PRN.   Swallow/Nutrition/ Hydration   dys 3 texures and thin liquids, intermittent supervision  Mod I  swallow strategies and regular texture trials   ADL's   max assist bed mobility, min assist sit<>stand Stedy, mod-max assist ADLs; pain in right anke limiting pts tolerance to standing past two days.  min assist  neuro re-ed of LUE, self care training, balance and self righting to midline training, functional transfer training, kinesiotape applied to left hand and wrist for edema reduction.   Mobility   maxA bed mobility, minA sit<>stand in [place], maxA sit<>stand with no AD - needs L knee block. in TIS w/c - requires totalA for w/c mobility  minA  bed mobility training, sitting and standing  balance training, functional transfers, pre-gait training in // bars, safety awareness.   Communication             Safety/Cognition/ Behavioral Observations  Mod A  Supervision A  sustained attention, basic problem solving, intellectual/emergent awareness, and recall   Pain   Pt denies pain.  Pain scale of <3/10  assess pain q shift and PRN   Skin   skin intact  no skin breakdown  assess skin q shift and PRN     Discharge Planning:  Home with wife who plans to take off one week at [place], made aware he will require care longer than one week and to discuss with other family members who can assist   Team Discussion: Right ankle pain addressed. Retrograde massage for left hand swelling.   Patient on target to meet rehab goals: Currently mod - max assist for transfers using a stedy. Max assist for BM and mox assist for sit- stand without a device. Mod assist - supervision goals for SLP and Discharge goals set for min assist overall.   *See Care Plan and progress notes for long and short-term goals.   Revisions to Treatment Plan:  Working on reg trials emergent awareness and recall Teaching Needs: Transfers, toileting, medications, etc.   Current Barriers to Discharge: Decreased caregiver support and Home enviroment access/layout  Possible Resolutions to Barriers: Family education    Medical Summary Current Status: R ankle pain, red and watery eye, acute blood loss anemia, left hand swelling, AKI resolved  Barriers to Discharge: Medical stability  Barriers to Discharge Comments: R ankle pain, red and watery eye, acute blood loss anemia, left hand swelling Possible Resolutions to [employer] Focus: continue right ankle bracing, continue eye drops, continue to monitor hemoglobin and creatinine, retrograde massage for left hand   Continued Need for Acute Rehabilitation Level of Care: The patient requires daily medical management by a physician with specialized training in physical  medicine and rehabilitation for the following reasons: Direction of a multidisciplinary physical rehabilitation program to maximize functional independence : Yes Medical management of patient stability for increased activity during participation in an intensive rehabilitation regime.: Yes Analysis of laboratory values and/or radiology reports with any subsequent need for medication adjustment and/or medical intervention. : Yes   I attest that I was present, lead the team conference, and concur with the assessment and plan of the team.   Chana Bode B 06/29/2020, 3:44 PM

## 2020-06-29 NOTE — Progress Notes (Signed)
Patient ID: [name], male   DOB: 1965/04/16, 55 y.o.   MRN: [id number]  Met wiht pt and spoke with wife via telephone pt discuss team conference goal of min assist level and discharge of 6/22. She asked if we thought he would be able to walk at discharge. Discussed to without physical assist. He will need to come in and see him in therapies. Gave her MD's number to call per MD request to answer her medical questions. She will leave FMLA forms in pt's drawer. Wife needs to see pt in therapies to see his current level and then progress closer to discharge. Will continue to work on discharge needs. Wife to call MD today

## 2020-06-29 NOTE — Progress Notes (Signed)
Speech Language Pathology Daily Session Note  Patient Details  Name: [name] MRN: [id number] Date of Birth: [birth year]  Today's Date: 06/29/2020 SLP Individual Time: 1032-1100 SLP Individual Time Calculation (min): 28 min  Short Term Goals: Week 1: SLP Short Term Goal 1 (Week 1): Patient will consume current diet with minimal overt s/s of aspiration with Supervision verbal cues for use of swallowing compensatory strategies. SLP Short Term Goal 2 (Week 1): Patient will demonstrate efficient mastication with minimal oral residue without overt s/s of aspiration with supervision level verbal cues over 2 sessions prior to upgrade. SLP Short Term Goal 3 (Week 1): Patient will recall new, daily information with Mod verbal and visual cues. SLP Short Term Goal 4 (Week 1): Patient will demonstrate sustained attention to functional tasks for 15 minutes with Min verbal cues for redirection. SLP Short Term Goal 5 (Week 1): Patient will demonstrate functional problem solving for basic and familiar tasks with Min verbal cues. SLP Short Term Goal 6 (Week 1): Patient will identify 2 physical and 2 cognitive changes with Min verbal cues.  Skilled Therapeutic Interventions:Skilled ST services focused on cognitive skills. SLP facilitated basic problem solving skills in 3 step picture sequencing task, pt required mod A fading to min A verbal cues. Pt demonstrated sustained attention in 10 minute intervals. Pt was left in room with call bell within reach and bed alarm set. SLP recommends to continue skilled services.     Pain Pain Assessment Pain Scale: 0-10 Pain Score: 0-No pain  Therapy/Group: Individual Therapy  Jordain Radin  [facility] 06/29/2020, 12:23 PM

## 2020-06-29 NOTE — Progress Notes (Signed)
Patient sustained fall this shift, no injury noted. Colonel Bald, PA on call provider and wife [name] notified. Patient educated on safety and calling for assistance. Patient voices understanding. Call bell within reach and bed in lowest position and patient voices understanding. Will continue to monitor.

## 2020-06-29 NOTE — Progress Notes (Signed)
Physical Therapy Session Note  Patient Details  Name: [name] MRN: [id number] Date of Birth: [birth year]  Today's Date: 06/29/2020 PT Individual Time: 1300-1410 PT Individual Time Calculation (min): 70 min   Short Term Goals: Week 1:  PT Short Term Goal 1 (Week 1): Pt will perform rolling R/L in bed with min assist PT Short Term Goal 2 (Week 1): Pt will perform supine<>sit with mod assist PT Short Term Goal 3 (Week 1): Pt wil perform sit<>stands using LRAD (but not mechanical lift) with mod assist PT Short Term Goal 4 (Week 1): Pt will initiate gait training  Skilled Therapeutic Interventions/Progress Updates:    Pt greeted seated in TIS w/c = family (cousins and aunt from out of town - [place]), at bedside. Pt agreeable to PT tx. Reports 4/10 R ankle pain but appears to be in no distress or discomfort during session, even with weight bearing. Wheeled in TIS dependently to main rehab gym. Once arrived, pt reports need to void. Using privacy screen in rehab gym, pt able to use urinal (with therapist holding it) to be continent of bladder while seated in w/c. Next, performed Stedy transfer ( able to stand in [place] from w/c level with minA) to mat table - requires cues for awareness of L knee alignment due to flopping out in a "frog leg" position. From the mat table, worked on repeated sit<>stands, 1x10, to [place] with CGA - provided pillow for L knee alignment to reduce abduction moment. Next, worked on standing in the [place] with minA for balance as he completed cone stacking from L<>R while using stronger RUE to improve functional reach in standing and L inattention. Then, he completed ball toss/taps while standing in the [place] with minA for balance - cues for keeping neutral trunk alignment to reduce forward flexion and working on core facilitation. Pt then returned to room in TIS w/c and completed Stedy transfer (as above) to recliner. BLE elevated with ice applied to R ankle, LUE supported with  pillow for shoulder approximation. All needs in reach and safety belt alarm activated.   Therapy Documentation Precautions:  Precautions Precautions: Fall Precaution Comments: Lt hemi with right gaze preference Restrictions Weight Bearing Restrictions: No General:    Therapy/Group: Individual Therapy  Orrin Brigham 06/29/2020, 7:42 AM

## 2020-06-29 NOTE — Progress Notes (Signed)
[place] PHYSICAL MEDICINE & REHABILITATION PROGRESS NOTE  Subjective/Complaints: Right ankle pain is much improved Team conference today Provided wife with my phone number to call with questions Vitals stable  ROS: Denies CP, SOB, N/V/D, +right ankle pain  Objective: Vital Signs: Blood pressure 118/72, pulse 81, temperature 98.3 F (36.8 C), temperature source Oral, resp. rate 18, height 5\' 10"  (1.778 m), weight 105.4 kg, SpO2 98 %. No results found. Recent Labs    06/28/20 0700 06/28/20 1103  WBC 6.0 6.7  HGB 11.4* 11.8*  HCT 34.6* 36.6*  PLT 412* 458*   Recent Labs    06/28/20 0700  NA 138  K 4.1  CL 105  CO2 24  GLUCOSE 96  BUN 14  CREATININE 1.18  CALCIUM 9.6    Intake/Output Summary (Last 24 hours) at 06/29/2020 1231 Last data filed at 06/29/2020 0936 Gross per 24 hour  Intake 598 ml  Output 550 ml  Net 48 ml        Physical Exam: BP 118/72 (BP Location: Left Arm)   Pulse 81   Temp 98.3 F (36.8 C) (Oral)   Resp 18   Ht 5\' 10"  (1.778 m)   Wt 105.4 kg   SpO2 98%   BMI 33.34 kg/m   Gen: no distress, normal appearing HEENT: oral mucosa pink and moist, NCAT Cardio: Reg rate Chest: normal effort, normal rate of breathing Abd: soft, non-distended Ext: no edema Psych: pleasant, normal affect Skin: intact Neuro:  Sensory exam normal sensationabsent  light touch  In Left  upper and lower extremities Cerebellar exam normal finger to nose to finger as well as heel to shin in bilateral upper and lower extremities Musculoskeletal: Full range of motion in all 4 extremities. No joint swelling  Neuro: Alert and oriented x3 Dysarthria, stable Motor: LUE: 0/5 proximal to distal LLE: HF, KE 2-/5, ADF 1/5, unchanged Left inattention RUE/RLE: 5/5 proximal distal Sensation intact light touch   Assessment/Plan: 1. Functional deficits which require 3+ hours per day of interdisciplinary therapy in a comprehensive inpatient rehab setting.  Physiatrist is  providing close team supervision and 24 hour management of active medical problems listed below.  Physiatrist and rehab team continue to assess barriers to discharge/monitor patient progress toward functional and medical goals   Care Tool:  Bathing    Body parts bathed by patient: Chest,Abdomen,Left arm,Face,Front perineal area,Right lower leg,Left lower leg   Body parts bathed by helper: Right arm,Buttocks     Bathing assist Assist Level: Moderate Assistance - Patient 50 - 74% (using LH sponge)     Upper Body Dressing/Undressing Upper body dressing   What is the patient wearing?: Pull over shirt    Upper body assist Assist Level: Maximal Assistance - Patient 25 - 49%    Lower Body Dressing/Undressing Lower body dressing      What is the patient wearing?: Incontinence brief,Pants     Lower body assist Assist for lower body dressing: 2 Helpers (using Stedy for sit<stand)     08/29/2020 assist Assist for toileting: 2 Helpers     Transfers Chair/bed transfer  Transfers assist     Chair/bed transfer assist level: 2 Helpers     Locomotion Ambulation   Ambulation assist   Ambulation activity did not occur: Safety/medical concerns          Walk 10 feet activity   Assist  Walk 10 feet activity did not occur: Safety/medical concerns  Walk 50 feet activity   Assist Walk 50 feet with 2 turns activity did not occur: Safety/medical concerns         Walk 150 feet activity   Assist Walk 150 feet activity did not occur: Safety/medical concerns         Walk 10 feet on uneven surface  activity   Assist Walk 10 feet on uneven surfaces activity did not occur: Safety/medical concerns         Wheelchair     Assist Will patient use wheelchair at discharge?:  (TBD)             Wheelchair 50 feet with 2 turns activity    Assist            Wheelchair 150 feet activity     Assist            Medical Problem List and Plan: 1.Left hemiparesis and functional deficitssecondary to right thalamic hemorrhage with intraventricular extension. Course complicated by seizure  Continue CIR PT, OT, SLP  WHO/PRAFO nightly 2. Antithrombotics: -DVT/anticoagulation:Pharmaceutical:Lovenox -antiplatelet therapy: N/A due to bleed 3.Ongoing headaches/pain Management:Continue Depakote and Keppra.  Topamax 25 daily started on 6/2 with benefit 4. Mood:LCSW to follow for evaluation and support -antipsychotic agents: N/AA 5. Neuropsych: This patientis intermittentlycapable of making decisions onhisown behalf. 6. Skin/Wound Care:Routine pressure-relief measures. Eucerin ordered for dry skin. Recommended wife bring coconut oil from home.  7. Fluids/Electrolytes/Nutrition:Monitor intake/output. Discussed anti-inflammatory diet, replacing ginger ale with water during the day and tart cherry juice at night, cranberry juice if he really wants juice during the day. 9.Partial complex seizure: Continue Keppra 500mg  twice daily. 10.HTN: Monitor blood pressures 3 times daily.  --Labile, continue amlodipine, labetalol, losartan and clonidine -SBP goal of <160 given ICH  Still in range Vitals:   06/28/20 1931 06/29/20 0438  BP: (!) 162/88 118/72  Pulse: 75 81  Resp: 16 18  Temp: 98.5 F (36.9 C) 98.3 F (36.8 C)  SpO2: 91% 98%   11.Dyslipidemia: On statin. 12.Acute kidney injury:   Resolved  13.History of alcohol abuse/delirium:Continue Seroquel 25 mg at bedtime to help with sleep/wake cycle. Discussed this would not be long term --Continue folic acid and thiamine supplementation 14.Acute blood loss anemia:   Hemoglobin 11.8 on 6/1, stable at 11.4  Continue to monitor 15.Morbid obesity/OHS/OSA?: Outpatient sleep study recommended for work-up 16.  Post stroke dysphagia  D3 thins, advance diet as  tolerated 17. Right ankle pain: suspect sprain given tenderness over ATFL. Ice 15mg  TID. Given chronicity of pain and pain with weightbearing, ordered right ankle XR- discussed with wife that there is no evidence of fracture or athropathy. Voltaren gel ordered with minimal benefit; capsaicin also ordered to see if this provides some relief. Requested RN soak foot in epsom salt if available. Ankle support brace ordered and patient wearing. Advised to elevate when resting, minimize weightbearing while with therapy to allow ligament to heal. Provided list of anti-inflammatory foods for pain. Discussed risks and benefits of various medications such as tylenol, NSAIDs, and prednisone and patient and wife decided on above conservative treatment and tylenol for now.   18.  Left shoulder pain exam is neg has hLeft hand swelling but no pain - at this time does not appear to be shoulder hand syndrome , avoid sleeping on left , Retyrograde massage to Left hand , may consiser removing wrist splint during the day, discussed with team  LOS: 7 days A FACE TO FACE EVALUATION WAS PERFORMED  8/1 P  Nehal Witting 06/29/2020, 12:31 PM

## 2020-06-30 MED ORDER — TAMSULOSIN HCL 0.4 MG PO CAPS
0.4000 mg | ORAL_CAPSULE | Freq: Every day | ORAL | Status: DC
  Administered 2020-06-30 – 2020-07-20 (×20): 0.4 mg via ORAL
  Filled 2020-06-30 (×21): qty 1

## 2020-06-30 NOTE — Progress Notes (Signed)
Speech Language Pathology Weekly Progress and Session Note  Patient Details  Name: [name] MRN: [id number] Date of Birth: 08/10/1965  Beginning of progress report period: June 24, 2020 End of progress report period: June 30, 2020  Today's Date: 06/30/2020 SLP Individual Time: 6761-9509 SLP Individual Time Calculation (min): 57 min  Short Term Goals: Week 1: SLP Short Term Goal 1 (Week 1): Patient will consume current diet with minimal overt s/s of aspiration with Supervision verbal cues for use of swallowing compensatory strategies. SLP Short Term Goal 1 - Progress (Week 1): Met SLP Short Term Goal 2 (Week 1): Patient will demonstrate efficient mastication with minimal oral residue without overt s/s of aspiration with supervision level verbal cues over 2 sessions prior to upgrade. SLP Short Term Goal 2 - Progress (Week 1): Not met SLP Short Term Goal 3 (Week 1): Patient will recall new, daily information with Mod verbal and visual cues. SLP Short Term Goal 3 - Progress (Week 1): Met SLP Short Term Goal 4 (Week 1): Patient will demonstrate sustained attention to functional tasks for 15 minutes with Min verbal cues for redirection. SLP Short Term Goal 4 - Progress (Week 1): Met SLP Short Term Goal 5 (Week 1): Patient will demonstrate functional problem solving for basic and familiar tasks with Min verbal cues. SLP Short Term Goal 5 - Progress (Week 1): Not met SLP Short Term Goal 6 (Week 1): Patient will identify 2 physical and 2 cognitive changes with Min verbal cues. SLP Short Term Goal 6 - Progress (Week 1): Met    New Short Term Goals: Week 2: SLP Short Term Goal 1 (Week 2): Patient will consume current diet with minimal overt s/s of aspiration with mod I swallowing compensatory strategies. SLP Short Term Goal 2 (Week 2): Patient will demonstrate efficient mastication with minimal oral residue without overt s/s of aspiration with supervision level verbal cues over 2 sessions prior  to upgrade. SLP Short Term Goal 3 (Week 2): Patient will recall new, daily information with Min verbal and visual cues SLP Short Term Goal 4 (Week 2): Patient will demonstrate sustained attention to functional tasks for 15-20 minutes wit supervision verbal cues for redirection. SLP Short Term Goal 5 (Week 2): Patient will demonstrate functional problem solving for basic and familiar tasks with Min verbal cues. SLP Short Term Goal 6 (Week 2): Pt will demonstrate self-monitoring and self-correction of functional errors in problem solving tasks with mod A verbal cues.  Weekly Progress Updates: Pt made good progress meeting 4 out 6 goals this reporting period. Pt is consuming and tolerating dys 3 textures and thin liquid diet with supervision A verbal cues for swallow strategies. Regular trials were not targeted during this reporting period due to focus on cognitive skills. Pt demonstrates improvements in intellectual awareness, recall and sustained attention. Pt participated in function problem solving tasks and left scanning skills. Pt would continue to benefit from skilled ST services in order to maximize functional independence and reduce burden of care, likely requiring supervision at discharge with continued skilled ST services.      Intensity: Minumum of 1-2 x/day, 30 to 90 minutes Frequency: 3 to 5 out of 7 days Duration/Length of Stay: 6/22 Treatment/Interventions: Cognitive remediation/compensation;Internal/external aids;Dysphagia/aspiration precaution training;Therapeutic Activities;Environmental controls;Cueing hierarchy;Functional tasks;Patient/family education   Daily Session  Skilled Therapeutic Interventions: Skilled ST services focused on cognitive skills. SLP facilitated basic problem solving, sustained attention, recall within task and left scanning in ALFA money management task. Pt was able to sort  coins with min A verbal cues for left scanning and extra time. Pt was able to  display requested amount and count change with max A fade to mod A verbal cues. Pt required max A verbal cues for awareness of errors but once aware pt was able to correct with mod A verbal cues and extra time. Pt expressed changes in vision and increased difficulty seeing out of right eye since acute CVA. Pt was able to name 3 physical and 2 cognitive acute deficits with supervision A verbal cues. Pt was left in room with call bell within reach and bed alarm set. SLP recommends to continue skilled services.   General    Pain Pain Assessment Pain Scale: 0-10 Pain Score: 5  Faces Pain Scale: No hurt Pain Type: Acute pain Pain Location: Head Pain Orientation: Right Pain Descriptors / Indicators: Headache Pain Intervention(s): Medication (See eMAR)  Therapy/Group: Individual Therapy  Giavonni Cizek  [facility] 06/30/2020, 11:08 AM

## 2020-06-30 NOTE — Progress Notes (Signed)
[facility] PHYSICAL MEDICINE & REHABILITATION PROGRESS NOTE  Subjective/Complaints:  Feels the urge to urinate but then unable to go   ROS: Denies CP, SOB, N/V/D, +right ankle pain  Objective: Vital Signs: Blood pressure 136/88, pulse 83, temperature 97.8 F (36.6 C), resp. rate 20, height 5\' 10"  (1.778 m), weight 105.4 kg, SpO2 100 %. No results found. Recent Labs    06/28/20 0700 06/28/20 1103  WBC 6.0 6.7  HGB 11.4* 11.8*  HCT 34.6* 36.6*  PLT 412* 458*   Recent Labs    06/28/20 0700  NA 138  K 4.1  CL 105  CO2 24  GLUCOSE 96  BUN 14  CREATININE 1.18  CALCIUM 9.6    Intake/Output Summary (Last 24 hours) at 06/30/2020 1224 Last data filed at 06/29/2020 1751 Gross per 24 hour  Intake 720 ml  Output 200 ml  Net 520 ml        Physical Exam: BP 136/88 (BP Location: Right Arm)   Pulse 83   Temp 97.8 F (36.6 C)   Resp 20   Ht 5\' 10"  (1.778 m)   Wt 105.4 kg   SpO2 100%   BMI 33.34 kg/m    General: No acute distress Mood and affect are appropriate Heart: Regular rate and rhythm no rubs murmurs or extra sounds Lungs: Clear to auscultation, breathing unlabored, no rales or wheezes Abdomen: Positive bowel sounds, soft nontender to palpation, nondistended Extremities: No clubbing, cyanosis, or edema Skin: No evidence of breakdown, no evidence of rash   Neuro:   Cerebellar exam normal finger to nose to finger as well as heel to shin in bilateral upper and lower extremities Musculoskeletal: Full range of motion in all 4 extremities. No joint swelling  Neuro: Alert and oriented x3 Dysarthria, stable Motor: LUE: 0/5 proximal to distal LLE: HF, KE 2-/5, ADF 1/5, unchanged Left inattention RUE/RLE: 5/5 proximal distal Sensation intact LT on RIght , absent on left    Assessment/Plan: 1. Functional deficits which require 3+ hours per day of interdisciplinary therapy in a comprehensive inpatient rehab setting.  Physiatrist is providing close team  supervision and 24 hour management of active medical problems listed below.  Physiatrist and rehab team continue to assess barriers to discharge/monitor patient progress toward functional and medical goals   Care Tool:  Bathing    Body parts bathed by patient: Chest,Abdomen,Left arm,Face,Front perineal area,Right lower leg,Left lower leg   Body parts bathed by helper: Right arm,Buttocks     Bathing assist Assist Level: Moderate Assistance - Patient 50 - 74% (using LH sponge)     Upper Body Dressing/Undressing Upper body dressing   What is the patient wearing?: Pull over shirt    Upper body assist Assist Level: Maximal Assistance - Patient 25 - 49%    Lower Body Dressing/Undressing Lower body dressing      What is the patient wearing?: Incontinence brief,Pants     Lower body assist Assist for lower body dressing: 2 Helpers (using Stedy for sit<stand)     08/29/2020 assist Assist for toileting: 2 Helpers     Transfers Chair/bed transfer  Transfers assist     Chair/bed transfer assist level: 2 Helpers     Locomotion Ambulation   Ambulation assist   Ambulation activity did not occur: Safety/medical concerns          Walk 10 feet activity   Assist  Walk 10 feet activity did not occur: Safety/medical concerns  Walk 50 feet activity   Assist Walk 50 feet with 2 turns activity did not occur: Safety/medical concerns         Walk 150 feet activity   Assist Walk 150 feet activity did not occur: Safety/medical concerns         Walk 10 feet on uneven surface  activity   Assist Walk 10 feet on uneven surfaces activity did not occur: Safety/medical concerns         Wheelchair     Assist Will patient use wheelchair at discharge?:  (TBD)             Wheelchair 50 feet with 2 turns activity    Assist            Wheelchair 150 feet activity     Assist           Medical Problem  List and Plan: 1.Left hemiparesis and functional deficitssecondary to right thalamic hemorrhage with intraventricular extension. Course complicated by seizure  Continue CIR PT, OT, SLP  WHO/PRAFO nightly 2. Antithrombotics: -DVT/anticoagulation:Pharmaceutical:Lovenox -antiplatelet therapy: N/A due to bleed 3.Ongoing headaches/pain Management:Continue Depakote and Keppra.  Topamax 25 daily started on 6/2 with benefit 4. Mood:LCSW to follow for evaluation and support -antipsychotic agents: N/AA 5. Neuropsych: This patientis intermittentlycapable of making decisions onhisown behalf. 6. Skin/Wound Care:Routine pressure-relief measures. Eucerin ordered for dry skin. Recommended wife bring coconut oil from home.  7. Fluids/Electrolytes/Nutrition:Monitor intake/output. Discussed anti-inflammatory diet, replacing ginger ale with water during the day and tart cherry juice at night, cranberry juice if he really wants juice during the day. 9.Partial complex seizure: Continue Keppra 500mg  twice daily. 10.HTN: Monitor blood pressures 3 times daily.  --Labile, continue amlodipine, labetalol, losartan and clonidine -SBP goal of <160 given ICH  Still in range Vitals:   06/29/20 1942 06/30/20 0616  BP: (!) 152/96 136/88  Pulse: 71 83  Resp: 18 20  Temp: 98.1 F (36.7 C) 97.8 F (36.6 C)  SpO2: 100% 100%   11.Dyslipidemia: On statin. 12.Acute kidney injury:   Resolved  13.History of alcohol abuse/delirium:Continue Seroquel 25 mg at bedtime to help with sleep/wake cycle. 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 Right ankle  pain: suspect sprain given tenderness over ATFL. Ice 15mg  TID. Given chronicity of pain and pain with weightbearing, ordered right ankle XR- discussed with wife that there is no evidence of fracture or athropathy. Voltaren gel ordered with minimal benefit; capsaicin also ordered to see if this provides some relief. Requested RN soak foot in epsom salt if available. Ankle support brace ordered and patient wearing. Advised to elevate when resting, minimize weightbearing while with therapy to allow ligament to heal. Provided list of anti-inflammatory foods for pain. Discussed risks and benefits of various medications such as tylenol, NSAIDs, and prednisone and patient and wife decided on above conservative treatment and tylenol for now.   18.  Left shoulder pain exam is neg has hLeft hand swelling but no pain - at this time does not appear to be shoulder hand syndrome , avoid sleeping on left , Retyrograde massage to Left hand , may consider removing wrist splint during the day, discussed with team 19.  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

## 2020-06-30 NOTE — Progress Notes (Signed)
Physical Therapy Weekly Progress Note  Patient Details  Name: [name] MRN: [id number] Date of Birth: 1965/03/16  Beginning of progress report period: June 23, 2020 End of progress report period: June 30, 2020  Today's Date: 06/30/2020 PT Individual Time: 1300-1415 PT Individual Time Calculation (min): 75 min   Patient has met 2 of 4 short term goals.  Pt has made some progress over the past week. He is able to roll in bed with minA with use of bed rails however still requires maxA for supine<>sit for L hemibody and truncal management. He's able to sit unsupported statically with CGA but dynamic sitting balance can require up to modA. He can perform sit<>stands with use of the Stedy with minA from a low surface and CGA from the perched position. He has began pre-gait training in // bars including lateral weight shifts and forward/backward stepping with his stronger R foot. He continues to be primarily limited by L hemibody weakness, absent sensation/proprioception in L hemibody, global deconditioning, delayed righting responses, and cognitive deficits (L inattention, poor safety awareness, mild impulsivity, poor sustained/divided attention). Continue skilled PT services with current POC.  Patient continues to demonstrate the following deficits muscle weakness, decreased cardiorespiratoy endurance, unbalanced muscle activation, motor apraxia and decreased coordination, decreased visual perceptual skills and decreased visual motor skills, decreased attention to left and decreased motor planning, decreased initiation, decreased attention, decreased awareness, decreased problem solving, decreased safety awareness and decreased memory and decreased sitting balance, decreased standing balance, decreased postural control, hemiplegia and decreased balance strategies and therefore will continue to benefit from skilled PT intervention to increase functional independence with mobility.  Patient progressing  toward long term goals..  Continue plan of care.  PT Short Term Goals Week 1:  PT Short Term Goal 1 (Week 1): Pt will perform rolling R/L in bed with min assist PT Short Term Goal 2 (Week 1): Pt will perform supine<>sit with mod assist PT Short Term Goal 3 (Week 1): Pt wil perform sit<>stands using LRAD (but not mechanical lift) with mod assist PT Short Term Goal 4 (Week 1): Pt will initiate gait training  Week 2:  PT Short Term Goal 1 (Week 2): Pt will complete bed mobility with modA consistently PT Short Term Goal 2 (Week 2): Pt will complete sit<>stand transfers with modA without the use of Stedy PT Short Term Goal 3 (Week 2): Pt will complete bed<>chair transfers with maxA and LRAD PT Short Term Goal 4 (Week 2): Pt will ambulate 44f with modA +2 with LRAD   Skilled Therapeutic Interventions/Progress Updates:    Pt greeted seated in TIS w/c with a friend at the bedside - pt agreeable to PT tx and reports no pain. Wheeled with totalA in TRutledgew/c throughout session. In day room gym, completed Stedy transfer with minA to stand and able to sit with CGA in perched position - transferred to mat table. Within the [place] used yoga block on outside of R knee to prevent abduction and promote neutral alignment - completed 1x10 sit<>Stands with CGA from mat table. Next, completed sitting balance tasks including ball toss (unweighted ball) with targets to L side to promote L inattention - pt with x1 large LOB posteriorly while completing this. Next, worked on sitting balance with cone stacking from chair in front of him towards his L side, again promoting L attention and forward weight shifts. Attempted to perform lateral elbow leans on L elbow pt pt c/o L hip flexor pain. Therefore, completed repeated sit<>Stands  with no Stedy and modA for L knee block and trunk extension - encouraged equal weight shift to LLE to promote quad facilitation. Next, focused on blocked practice squat<>pivot transfers where he  required maxA from TIS w/c to mat table and modA from mat table to TIS w/c, all towards stronger R side. Pt returned to room where he remained seated in TIS w/c and made comfortable with safety belt alarm on and all needs within reach.   Therapy Documentation Precautions:  Precautions Precautions: Fall Precaution Comments: Lt hemi with right gaze preference Restrictions Weight Bearing Restrictions: No General:    Therapy/Group: Individual Therapy  Aslan Himes P Tameyah Koch  PT 06/30/2020, 7:48 AM

## 2020-06-30 NOTE — Progress Notes (Signed)
Occupational Therapy Session Note  Patient Details  Name: [name] MRN: [id number] Date of Birth: Jun 05, 1965  Today's Date: 06/30/2020 OT Individual Time: 1000-1100 OT Individual Time Calculation (min): 60 min    Short Term Goals: Week 1:  OT Short Term Goal 1 (Week 1): Pt will be able to complete stedy transfer to toilet with min A of 1. OT Short Term Goal 2 (Week 1): Pt will be able to stand in stedy with min A while using R hand to adjust clothing over hips with min A. OT Short Term Goal 3 (Week 1): Pt will don shirt with min A. OT Short Term Goal 4 (Week 1): Pt will be able to self mobilize LUE with min A. OT Short Term Goal 5 (Week 1): Pt will be able to self feed with supervision.  Skilled Therapeutic Interventions/Progress Updates:    Pt supine in bed semi upright, reports 4/10 pain in right ankle at rest.  Pt motivated to get OOB today and change clothes during OT session.  Pt completed right roll to sidelying and to sit EOB with max assist and step by step VCs for body mechanics.  Pt dressed UB and LB sitting EOB requiring max VCs and intermittent visual cues to orient position of clothing and sequence hemitechniques.  Pt also needing CGA to min assist and VCs to maintain midline trunk position due to when pt dividing attention he leans posteriorly.  Pt needed mod assist to donn shorts at sit to stand and total assist to doff/donn brief in standing using stedy.  Pt brushed teeth seated in TIS w/c with neuro re-ed of LUE to facilitate functional movements including shoulder FF, forearm rotation, elbow flexion, and pinch/grip (needing max manual support and noting trace desired muscle recruitment throughout)  Pt then participated in standing functional reaching task using RUE to facilitate multitasking/divided attention of maintaining standing balance while attending to additional tasks.  Pt stood at stedy for task with CGA and min intermittent LOB noted.  Stand to sit at [facility] w/c with  CGA.  Pt completed forward and lateral weight shifting to reposition pelvis posteriorly with multimodal cues and CGA needed.  Call bell in reach, seat belt alarm on LUE supported with pillows.  Pt exhibited improved standing tolerance at right ankle today as well as increased independence with repositioning self in w/c.    Therapy Documentation Precautions:  Precautions Precautions: Fall Precaution Comments: Lt hemi with right gaze preference Restrictions Weight Bearing Restrictions: No   Therapy/Group: Individual Therapy  Amie Critchley 06/30/2020, 3:15 PM

## 2020-07-01 MED ORDER — GABAPENTIN 100 MG PO CAPS
100.0000 mg | ORAL_CAPSULE | Freq: Three times a day (TID) | ORAL | Status: DC
  Administered 2020-07-01 – 2020-07-05 (×12): 100 mg via ORAL
  Filled 2020-07-01 (×12): qty 1

## 2020-07-01 MED ORDER — LIDOCAINE 5 % EX PTCH
1.0000 | MEDICATED_PATCH | CUTANEOUS | Status: DC
  Administered 2020-07-01 – 2020-07-03 (×3): 1 via TRANSDERMAL
  Filled 2020-07-01 (×3): qty 1

## 2020-07-01 NOTE — Progress Notes (Signed)
Speech Language Pathology Daily Session Note  Patient Details  Name: [name] MRN: [id number] Date of Birth: 08-Jan-1966  Today's Date: 07/01/2020 SLP Individual Time: 1115-1200 SLP Individual Time Calculation (min): 45 min  Short Term Goals: Week 2: SLP Short Term Goal 1 (Week 2): Patient will consume current diet with minimal overt s/s of aspiration with mod I swallowing compensatory strategies. SLP Short Term Goal 2 (Week 2): Patient will demonstrate efficient mastication with minimal oral residue without overt s/s of aspiration with supervision level verbal cues over 2 sessions prior to upgrade. SLP Short Term Goal 3 (Week 2): Patient will recall new, daily information with Min verbal and visual cues SLP Short Term Goal 4 (Week 2): Patient will demonstrate sustained attention to functional tasks for 15-20 minutes wit supervision verbal cues for redirection. SLP Short Term Goal 5 (Week 2): Patient will demonstrate functional problem solving for basic and familiar tasks with Min verbal cues. SLP Short Term Goal 6 (Week 2): Pt will demonstrate self-monitoring and self-correction of functional errors in problem solving tasks with mod A verbal cues.  Skilled Therapeutic Interventions:   Patient seen for skilled ST session focusing on cognitive-linguistic goals. Patient sitting up in [facility] when SLP arrived and had been napping but awakened without difficulty. He reported that his left shoulder was sore and that although OT tried an arm sling but he did not like it. Patient able to sequence three photo cards with 100% accuracy and supervision A. He told SLP that he felt he would be able to walk around in room if he was able to use a walker but that therapists did not want him doing that. He was not able to give a clear answer when SLP asked him why he thought this was recommended. SLP then talked about his left leg movement and he was aware that he was not able to move it much. He then appeared to  understand somewhat, saying, "Oh that's why". Patient able to scan left to right to locate symbols in group of 80 when given cue of which row to scan, requiring initially modA cues but improving to minA cues overall. Patient did not appear as impulsive as he has in previous sessions, but continues with impaired awareness. He continues to benefit from skilled SLP intervention to maximize cognitive-linguistic abilities prior to discharge.  Pain Pain Assessment Pain Scale: 0-10 Pain Score: 4  Pain Type: Acute pain Pain Location: Shoulder Pain Orientation: Left Pain Descriptors / Indicators: Discomfort;Sore (RN already aware) Pain Onset: On-going Pain Intervention(s): Other (Comment) (RN already aware) Multiple Pain Sites: No  Therapy/Group: Individual Therapy  Angela Nevin, MA, CCC-SLP Speech Therapy

## 2020-07-01 NOTE — Progress Notes (Signed)
Physical Therapy Session Note  Patient Details  Name: [name] MRN: [id number] Date of Birth: [birth year]  Today's Date: 07/01/2020 PT Individual Time: 1415-1530 PT Individual Time Calculation (min): 75 min   Short Term Goals: Week 2:  PT Short Term Goal 1 (Week 2): Pt will complete bed mobility with modA consistently PT Short Term Goal 2 (Week 2): Pt will complete sit<>stand transfers with modA without the use of Stedy PT Short Term Goal 3 (Week 2): Pt will complete bed<>chair transfers with maxA and LRAD PT Short Term Goal 4 (Week 2): Pt will ambulate 64ft with modA +2 with LRAD  Skilled Therapeutic Interventions/Progress Updates:    Handoff of care from OT in rehab gym to start session with pt sitting in perched position on Stedy with CGA for balance. Pt agreeable to PT tx without reports of pain. Wheeled in TIS w/c inside // bars to focus on gait training. Donned Giv-mohr sling to paretic LUE to allow shoulder approximation during gait training. With L knee blocked, pt able to stand in // bars with minA and requires min/modA for standing balance while maintaining L knee block in // bars. In total, pt ambulated 3x65ft in // bars with +2 assist for w/c follow and modA for managing trunk, L knee block, and assisting facilitating L swing/foot placement. Pt needing heavy cueing on gait mechanics and sequencing, especially for lateral weights shifts during gait. Next, wheeled to [place] system where he worked on trail making #1-#25 and geoboard replication with memory component. Pt able to complete trail making with 95% accuracy but needed cues for visual scanning to the L to identify/locate #'s. With goboard task, he was able to complete very simple puzzles but struggled with mild complexity with decreased STM/recall. Next, worked on squat<>pivot transfers from [employer] w/c to mat table where he required maxA +2 to complete with mild impulsivity while doing this. While seated EOM with CGA, worked on  functional reaching with RUE in multiple planes - LOB's occur when reaching across midline to the L. Pt wheeled back to his room where he requested to return to bed. Used Stedy with minA +2 for standing and steadying for transfer. Required +2 modA for sit>supine with trunk and LE support. Able to boost himeslf up in bed without assist. LUE supported with pillow at end of session with Giv-mohr sling removed. Bed alarm on and all needs within reach at end of session.  Pt continues to show poor safety awareness, mild impulsivity, decreased attention, and impaired motor planning which impacts functional mobility.    Therapy Documentation Precautions:  Precautions Precautions: Fall Precaution Comments: Lt hemi with right gaze preference Restrictions Weight Bearing Restrictions: No General:    Therapy/Group: Individual Therapy  Orrin Brigham 07/01/2020, 7:36 AM

## 2020-07-01 NOTE — Progress Notes (Signed)
[place] PHYSICAL MEDICINE & REHABILITATION PROGRESS NOTE  Subjective/Complaints: C/o left shoulder and groin pain  ROS: Denies CP, SOB, N/V/D, +left shoulder and groin pain  Objective: Vital Signs: Blood pressure 117/85, pulse 82, temperature 97.8 F (36.6 C), temperature source Oral, resp. rate 19, height 5\' 10"  (1.778 m), weight 105.4 kg, SpO2 100 %. No results found. No results for input(s): WBC, HGB, HCT, PLT in the last 72 hours.  No results for input(s): NA, K, CL, CO2, GLUCOSE, BUN, CREATININE, CALCIUM in the last 72 hours.   Intake/Output Summary (Last 24 hours) at 07/01/2020 1426 Last data filed at 07/01/2020 1343 Gross per 24 hour  Intake 716 ml  Output 700 ml  Net 16 ml        Physical Exam: BP 117/85 (BP Location: Right Arm)   Pulse 82   Temp 97.8 F (36.6 C) (Oral)   Resp 19   Ht 5\' 10"  (1.778 m)   Wt 105.4 kg   SpO2 100%   BMI 33.34 kg/m    Gen: no distress, normal appearing HEENT: oral mucosa pink and moist, NCAT Cardio: Reg rate Chest: normal effort, normal rate of breathing Abd: soft, non-distended Ext: no edema Psych: pleasant, normal affect Skin: intact   Neuro:   Cerebellar exam normal finger to nose to finger as well as heel to shin in bilateral upper and lower extremities Musculoskeletal: Full range of motion in all 4 extremities. No joint swelling  Neuro: Alert and oriented x3 Dysarthria, stable Motor: LUE: 0/5 proximal to distal LLE: HF, KE 2-/5, ADF 1/5, unchanged Left inattention RUE/RLE: 5/5 proximal distal Sensation intact LT on RIght , absent on left    Assessment/Plan: 1. Functional deficits which require 3+ hours per day of interdisciplinary therapy in a comprehensive inpatient rehab setting. Physiatrist is providing close team supervision and 24 hour management of active medical problems listed below. Physiatrist and rehab team continue to assess barriers to discharge/monitor patient progress toward functional and  medical goals   Care Tool:  Bathing    Body parts bathed by patient: Chest, Abdomen, Left arm, Face, Front perineal area, Right lower leg, Right upper leg, Left upper leg   Body parts bathed by helper: Right arm, Buttocks Body parts n/a: Right arm, Buttocks, Left lower leg   Bathing assist Assist Level: Moderate Assistance - Patient 50 - 74%     Upper Body Dressing/Undressing Upper body dressing   What is the patient wearing?: Pull over shirt    Upper body assist Assist Level: Minimal Assistance - Patient > 75%    Lower Body Dressing/Undressing Lower body dressing      What is the patient wearing?: Incontinence brief, Pants     Lower body assist Assist for lower body dressing: Moderate Assistance - Patient 50 - 74% (with use of stedy)     Toileting Toileting    Toileting assist Assist for toileting: Moderate Assistance - Patient 50 - 74% (with use of stedy)     Transfers Chair/bed transfer  Transfers assist     Chair/bed transfer assist level: 2 Helpers     Locomotion Ambulation   Ambulation assist   Ambulation activity did not occur: Safety/medical concerns          Walk 10 feet activity   Assist  Walk 10 feet activity did not occur: Safety/medical concerns        Walk 50 feet activity   Assist Walk 50 feet with 2 turns activity did not occur: Safety/medical concerns  Walk 150 feet activity   Assist Walk 150 feet activity did not occur: Safety/medical concerns         Walk 10 feet on uneven surface  activity   Assist Walk 10 feet on uneven surfaces activity did not occur: Safety/medical concerns         Wheelchair     Assist Will patient use wheelchair at discharge?:  (TBD)             Wheelchair 50 feet with 2 turns activity    Assist            Wheelchair 150 feet activity     Assist           Medical Problem List and Plan: 1.  Left hemiparesis and functional deficits  secondary to right thalamic hemorrhage with intraventricular extension. Course complicated by seizure  Continue CIR PT, OT, SLP  WHO/PRAFO nightly 2.  Antithrombotics: -DVT/anticoagulation:  Pharmaceutical: Lovenox             -antiplatelet therapy: N/A due to bleed 3.  Ongoing headaches/pain Management: Continue Depakote and Keppra.  Topamax 25 daily started on 6/2 with benefit 4. Mood: LCSW to follow for evaluation and support             -antipsychotic agents: N/AA 5. Neuropsych: This patient is intermittently capable of making decisions on his own behalf. 6. Skin/Wound Care: Routine pressure-relief measures. Eucerin ordered for dry skin. Recommended wife bring coconut oil from home.  7. Fluids/Electrolytes/Nutrition: Monitor intake/output. Discussed anti-inflammatory diet, replacing ginger ale with water during the day and tart cherry juice at night, cranberry juice if he really wants juice during the day. 9.  Partial complex seizure: Continue Keppra 500 mg twice daily. 10.  HTN: Monitor blood pressures 3 times daily.              -- Labile, continue amlodipine, labetalol, losartan and clonidine             -SBP goal of <160 given ICH  Still in range Vitals:   07/01/20 0520 07/01/20 1301  BP: 131/81 117/85  Pulse: 81 82  Resp: 17 19  Temp: 98.2 F (36.8 C) 97.8 F (36.6 C)  SpO2: 96% 100%   11.  Dyslipidemia: On statin. 12.  Acute kidney injury:   Resolved  13.  History of alcohol abuse/delirium: Continue Seroquel 25 mg at bedtime to help with sleep/wake cycle. Discussed this would not be long term             -- Continue folic acid and thiamine supplementation 14.  Acute blood loss anemia:   Hemoglobin 11.8 on 6/1, stable at 11.4  Continue to monitor 15.  Morbid obesity/OHS/OSA?: Outpatient sleep study recommended for work-up 16.  Post stroke dysphagia  D3 thins, advance diet as tolerated 17. Right ankle pain: suspect sprain given tenderness over ATFL. Ice 15mg  TID. Given  chronicity of pain and pain with weightbearing, ordered right ankle XR- discussed with wife that there is no evidence of fracture or athropathy. Voltaren gel ordered with minimal benefit; capsaicin also ordered to see if this provides some relief. Requested RN soak foot in epsom salt if available. Ankle support brace ordered and patient wearing. Advised to elevate when resting, minimize weightbearing while with therapy to allow ligament to heal. Provided list of anti-inflammatory foods for pain. Discussed risks and benefits of various medications such as tylenol, NSAIDs, and prednisone and patient and wife decided on above conservative treatment and tylenol for  now.   18.  Left shoulder pain exam is neg has hLeft hand swelling but no pain - at this time does not appear to be shoulder hand syndrome , avoid sleeping on left , Retyrograde massage to Left hand , may consider removing wrist splint during the day, discussed with team 19.  Urinary urgency but unable to void, suspect spastic bladder with BPH , start flomax, BP should support this med , as discussed with RN would check bladder scan as well  20. Left shoulder pain: lidocaine patch ordered 21, Left groin pain: ordered gabapentin 100mg  TID  LOS: 9 days A FACE TO FACE EVALUATION WAS PERFORMED  [name] 07/01/2020, 2:26 PM

## 2020-07-01 NOTE — Progress Notes (Signed)
Occupational Therapy Weekly Progress Note  Patient Details  Name: [name] MRN: [id number] Date of Birth: Dec 21, 1965  Beginning of progress report period: June 23, 2020 End of progress report period: July 01, 2020  Today's Date: 07/01/2020 OT Individual Time: 0930-1030 OT Individual Time Calculation (min): 60 min    Patient has met 5 of 5 short term goals.  Pt has been making progress with his mobility and self care to be able to do more but with use of the stedy lift.  Pt will need to start working on safe transfers without the use of the stedy.Marland Kitchen  He is developing L finger active flexion and some active sh abd but overall his arm is very "heavy" with subluxation and has recently developed pain.   Patient continues to demonstrate the following deficits: abnormal tone and unbalanced muscle activation, decreased visual perceptual skills, decreased visual motor skills, and hemianopsia, decreased attention to left, decreased attention, decreased awareness, decreased problem solving, decreased safety awareness, and decreased memory, and decreased sitting balance, decreased standing balance, decreased postural control, hemiplegia, and decreased balance strategies and therefore will continue to benefit from skilled OT intervention to enhance overall performance with BADL and Reduce care partner burden.  Patient progressing toward long term goals..  Continue plan of care.  OT Short Term Goals Week 1:  OT Short Term Goal 1 (Week 1): Pt will be able to complete stedy transfer to toilet with min A of 1. OT Short Term Goal 1 - Progress (Week 1): Met OT Short Term Goal 2 (Week 1): Pt will be able to stand in stedy with min A while using R hand to adjust clothing over hips with min A. OT Short Term Goal 2 - Progress (Week 1): Met OT Short Term Goal 3 (Week 1): Pt will don shirt with min A. OT Short Term Goal 3 - Progress (Week 1): Met OT Short Term Goal 4 (Week 1): Pt will be able to self mobilize LUE  with min A. OT Short Term Goal 4 - Progress (Week 1): Met OT Short Term Goal 5 (Week 1): Pt will be able to self feed with supervision. OT Short Term Goal 5 - Progress (Week 1): Met Week 2:  OT Short Term Goal 1 (Week 2): Pt will complete transfers to toilet with squat pivot of mod A. OT Short Term Goal 2 (Week 2): Pt will be able rise to stand with mod A of 1. OT Short Term Goal 3 (Week 2): Pt will be able to don pants over his feet with min A.  Skilled Therapeutic Interventions/Progress Updates:    Pt received in bed ready for therapy. Pt c/o L shoulder pain.  Discussed options of kinesiotape for support (will be applied in next session) and use of Give Mohr sling during standing activities. Adjusted large sling on pt and provided it to his PT to use in a later session.  +2 A from rehab tech for support.   Pt rolled to L with min and then sat to EOB with mod A and then was able to hold static balance EOB with S. Used stedy lift today to move pt from bed to [facility] over toilet where he toileted, bathed and dressed. Focused on pt actively rising to stand and holding standing balance while engaging in self care. He continues to have limited LLE activity and his leg externally rotates easily.  Used gait belt around thighs to maintain LLE in neutral.   Pt engaged well and  attended to L side with only min cues.  Cues for LUE management to avoid it falling off of his lap.   Pt returned to wc.  Adjusted in wc with all needs met and belt alarm on.  Pillow under L arm for support.   Therapy Documentation Precautions:  Precautions Precautions: Fall Precaution Comments: Lt hemi with right gaze preference Restrictions Weight Bearing Restrictions: No   Pain: Pain Assessment Pain Scale: 0-10 Pain Score: 5  Pain Type: Acute pain Pain Location: Head Pain Orientation: Right Pain Descriptors / Indicators: Headache Pain Intervention(s): Medication (See eMAR) ADL: ADL Eating: Set up Grooming:  Setup Where Assessed-Grooming: Sitting at sink Upper Body Bathing: Minimal assistance (sitting on BSC) Where Assessed-Upper Body Bathing: Sitting at sink Lower Body Bathing: Moderate assistance (sitting on BSC) Where Assessed-Lower Body Bathing: Sitting at sink Upper Body Dressing: Minimal assistance Where Assessed-Upper Body Dressing: Sitting at sink Lower Body Dressing: Moderate assistance Where Assessed-Lower Body Dressing:  (sitting on BSC with use of stedy) Toileting: Moderate assistance (with use of stedy) Where Assessed-Toileting: Bedside Commode, Toilet Toilet Transfer: Minimal assistance Toilet Transfer Method: Other (comment) (with use of the stedy) Toilet Transfer Equipment: Bedside commode, Raised toilet seat   Therapy/Group: Individual Therapy  [place] 07/01/2020, 12:37 PM

## 2020-07-01 NOTE — Progress Notes (Signed)
Occupational Therapy Session Note  Patient Details  Name: [name] MRN: [id number] Date of Birth: 1965/05/28  Today's Date: 07/01/2020 OT Individual Time: 1330-1415 OT Individual Time Calculation (min): 45 min    Short Term Goals: Week 1:  OT Short Term Goal 1 (Week 1): Pt will be able to complete stedy transfer to toilet with min A of 1. OT Short Term Goal 1 - Progress (Week 1): Met OT Short Term Goal 2 (Week 1): Pt will be able to stand in stedy with min A while using R hand to adjust clothing over hips with min A. OT Short Term Goal 2 - Progress (Week 1): Met OT Short Term Goal 3 (Week 1): Pt will don shirt with min A. OT Short Term Goal 3 - Progress (Week 1): Met OT Short Term Goal 4 (Week 1): Pt will be able to self mobilize LUE with min A. OT Short Term Goal 4 - Progress (Week 1): Met OT Short Term Goal 5 (Week 1): Pt will be able to self feed with supervision. OT Short Term Goal 5 - Progress (Week 1): Met Week 2:  OT Short Term Goal 1 (Week 2): Pt will complete transfers to toilet with squat pivot of mod A. OT Short Term Goal 2 (Week 2): Pt will be able rise to stand with mod A of 1. OT Short Term Goal 3 (Week 2): Pt will be able to don pants over his feet with min A.  Skilled Therapeutic Interventions/Progress Updates:    Pt received in TIS w/c and consented to OT tx. Therapist applied Kinesiotape to L shoulder for increased shoulder support to decrease risk of subluxation. Pt refused Giv-Mor sling during today's session, but therapist supported LUE throughout standing activity. Pt instructed in STS on Stedy with CGA, cuing for upright posture with therapist held L hand on Stedy for LUE support and to facilitate gross grasp. Pt instructed in functional reaching outside of BOS while in Stedy to reach for cards, visually scan and match on board in front of him all while standing. 2nd person utilized to stand on other side of pt during balance tasks for increased safety. Pt  required multiple seated rest breaks due to fatigue. Utilized Geologist, engineering for visual feedback to increase body awareness as pt reports he feels like his feet are "all over the place." Once pt saw himself in the mirror he was much more confident. Instructed to reach across midline for cards, required min cuing for cards that were placed in upper L quadrant of visual field. After tx, pt handed off to PT.  Therapy Documentation Precautions:  Precautions Precautions: Fall Precaution Comments: Lt hemi with right gaze preference Restrictions Weight Bearing Restrictions: No  Vital Signs: Therapy Vitals Temp: 97.8 F (36.6 C) Temp Source: Oral Pulse Rate: 82 Resp: 19 BP: 117/85 Patient Position (if appropriate): Sitting Oxygen Therapy SpO2: 100 % O2 Device: Room Air Pain: Pain Assessment Pain Scale: 0-10 Pain Score: 4  Pain Type: Acute pain Pain Location: Head Pain Orientation: Right Pain Descriptors / Indicators: Headache Pain Intervention(s): Medication (See eMAR)   Therapy/Group: Individual Therapy  Xavious Sharrar 07/01/2020, 3:27 PM

## 2020-07-01 NOTE — Progress Notes (Signed)
Patient ID: [name], male   DOB: 1965-05-11, 55 y.o.   MRN: [id number] Left wife SSD information and records to take when she applies for disability for husband. Pam-PA has FMLA forms and will return once completed. Made aware aware pt will require 24/7 care at discharge.

## 2020-07-02 DIAGNOSIS — I69320 Aphasia following cerebral infarction: Secondary | ICD-10-CM

## 2020-07-02 DIAGNOSIS — I69319 Unspecified symptoms and signs involving cognitive functions following cerebral infarction: Secondary | ICD-10-CM

## 2020-07-02 DIAGNOSIS — G8194 Hemiplegia, unspecified affecting left nondominant side: Secondary | ICD-10-CM

## 2020-07-02 NOTE — Progress Notes (Signed)
Speech Language Pathology Daily Session Note  Patient Details  Name: [name] MRN: [id number] Date of Birth: April 04, 1965  Today's Date: 07/02/2020 SLP Individual Time: 1440-1510 SLP Individual Time Calculation (min): 30 min  Short Term Goals: Week 2: SLP Short Term Goal 1 (Week 2): Patient will consume current diet with minimal overt s/s of aspiration with mod I swallowing compensatory strategies. SLP Short Term Goal 2 (Week 2): Patient will demonstrate efficient mastication with minimal oral residue without overt s/s of aspiration with supervision level verbal cues over 2 sessions prior to upgrade. SLP Short Term Goal 3 (Week 2): Patient will recall new, daily information with Min verbal and visual cues SLP Short Term Goal 4 (Week 2): Patient will demonstrate sustained attention to functional tasks for 15-20 minutes wit supervision verbal cues for redirection. SLP Short Term Goal 5 (Week 2): Patient will demonstrate functional problem solving for basic and familiar tasks with Min verbal cues. SLP Short Term Goal 6 (Week 2): Pt will demonstrate self-monitoring and self-correction of functional errors in problem solving tasks with mod A verbal cues.  Skilled Therapeutic Interventions: Skilled treatment session focused on cognitive goals. SLP facilitated session by providing Min A verbal cues for complex problem solving during a medication management task in which patient had to identify errors. This task also focused on working memory in which he required [employer] cues. Patient was overall Mod I for sustained attention and left visual scanning throughout session. Patient left upright in wheelchair with alarm on and all needs within reach. Continue with current plan of care.      Pain Pain Assessment Pain Scale: 0-10 Pain Score: 2  Pain Type: Acute pain Pain Location: Groin Pain Orientation: Left Pain Descriptors / Indicators: Discomfort;Sore Pain Frequency: Intermittent Pain Onset:  On-going Pain Intervention(s): Medication (See eMAR);Repositioned Multiple Pain Sites: No  Therapy/Group: Individual Therapy  Hernan Turnage 07/02/2020, 3:32 PM

## 2020-07-02 NOTE — Progress Notes (Signed)
Occupational Therapy Session Note  Patient Details  Name: [name] MRN: [id number] Date of Birth: 10/03/1965  Today's Date: 07/02/2020 OT Individual Time: 1030-1135 OT Individual Time Calculation (min): 65 min    Short Term Goals: Week 2:  OT Short Term Goal 1 (Week 2): Pt will complete transfers to toilet with squat pivot of mod A. OT Short Term Goal 2 (Week 2): Pt will be able rise to stand with mod A of 1. OT Short Term Goal 3 (Week 2): Pt will be able to don pants over his feet with min A.  Skilled Therapeutic Interventions/Progress Updates:    Pt received in w/c and agreeable to therapy and getting a shower. +2 A for session for safety.  Pt used stedy lift to move from wc to [facility] in walk in shower.  With occasional cues for shifting wt to his R, pt able to sit with min A when he was moving dynamically to wash his bottom or reach his feet with long sponge. Used stedy to move back to chair for dressing.   From wc pt attempted to don pants over feet but difficult as he was sitting too high and did not have good feet support.  Had pt stand with his R hand on sink and support though L arm and leg. Pt able to rise to stand with mod A and hold static balance with mod A. Pt worked on pulling up his own pants but began to lean to L too much resulting in pt needing max A.   Completed oral care and then kinesio tape reapplied to his L sh for support. Nursing had removed it last night to apply voltaren gel.   Pt adjusted in chair with pillow for support, belt alarm on and all needs met.   Therapy Documentation Precautions:  Precautions Precautions: Fall Precaution Comments: Lt hemi with right gaze preference Restrictions Weight Bearing Restrictions: No   Pain: general c/o L shoulder pain, nursing aware and applies voltaren gel.    ADL: ADL Eating: Set up Grooming: Setup Where Assessed-Grooming: Sitting at sink Upper Body Bathing: Minimal assistance (sitting on BSC) Where  Assessed-Upper Body Bathing: Sitting at sink Lower Body Bathing: Moderate assistance (sitting on BSC) Where Assessed-Lower Body Bathing: Sitting at sink Upper Body Dressing: Minimal assistance Where Assessed-Upper Body Dressing: Sitting at sink Lower Body Dressing: Moderate assistance Where Assessed-Lower Body Dressing:  (sitting on BSC with use of stedy) Toileting: Moderate assistance (with use of stedy) Where Assessed-Toileting: Bedside Commode, Toilet Toilet Transfer: Minimal assistance Toilet Transfer Method: Other (comment) (with use of the stedy) Toilet Transfer Equipment: Bedside commode, Raised toilet seat   Therapy/Group: Individual Therapy  [place] 07/02/2020, 12:56 PM

## 2020-07-02 NOTE — Progress Notes (Signed)
Physical Therapy Session Note  Patient Details  Name: [name] MRN: [id number] Date of Birth: [birth year]  Today's Date: 07/02/2020 PT Individual Time: 8295-6213 PT Individual Time Calculation (min): 44 min   Short Term Goals:  Week 2:  PT Short Term Goal 1 (Week 2): Pt will complete bed mobility with modA consistently PT Short Term Goal 2 (Week 2): Pt will complete sit<>stand transfers with modA without the use of Stedy PT Short Term Goal 3 (Week 2): Pt will complete bed<>chair transfers with maxA and LRAD PT Short Term Goal 4 (Week 2): Pt will ambulate 28f with modA +2 with LRAD Week 3:     Skilled Therapeutic Interventions/Progress Updates:   Pt received sitting in WC and agreeable to PT. Pt transported to rail in hall outside rehab gym. Sit<>stand at rail in hall with mod assist to block to the LLE.  Pregait stepping R and L 2 x 5 BLE. With max assist to advance the LLE and block in stance to move the RLE. Cues for improved use of mirror for visual feedback to sustain trunk and hip extension  Gait training at rail in hall with RUE support, max Assist +2 for factiliation of normalized gait pattern and WC follow [id number]; max cues for gait pattern including weight shift to the R with LLE advancement, improved erect trunk posture, decreased step length on the L, activation of quads in stance. . Gertie Exoneciprocal movement training 2 x 3 min with cues for use of RUE to stabilize trunk in sitting as well as attention to the LLE to improve ROM and symmetry of RLE and LLE intermittently. Assist from PT therapy to maintain neutral ER/IR on the LLE throughout.   Patient returned to room and [facility] in reach and all needs met.         Therapy Documentation Precautions:  Precautions Precautions: Fall Precaution Comments: Lt hemi with right gaze preference Restrictions Weight Bearing Restrictions: No  Vital Signs: Therapy Vitals Temp: 98 F (36.7  C) Temp Source: Oral Pulse Rate: 84 Resp: 19 BP: (!) 144/81 Patient Position (if appropriate): Sitting Oxygen Therapy SpO2: 100 % O2 Device: Room Air Pain: Pain Assessment Pain Scale: 0-10 Pain Score: 2  Pain Type: Acute pain Pain Location: Groin Pain Orientation: Left Pain Descriptors / Indicators: Discomfort;Sore Pain Frequency: Intermittent Pain Onset: On-going Pain Intervention(s): Medication (See eMAR);Repositioned Multiple Pain Sites: No    Therapy/Group: Individual Therapy  AuLorie Phenix/10/2020, 4:14 PM

## 2020-07-02 NOTE — Progress Notes (Signed)
[place] PHYSICAL MEDICINE & REHABILITATION PROGRESS NOTE  Subjective/Complaints: C/o left shoulder and groin pain  ROS: Denies CP, SOB, N/V/D, +left shoulder and groin pain  Objective: Vital Signs: Blood pressure 133/88, pulse 89, temperature 98 F (36.7 C), temperature source Oral, resp. rate 18, height 5\' 10"  (1.778 m), weight 105.4 kg, SpO2 92 %. No results found. No results for input(s): WBC, HGB, HCT, PLT in the last 72 hours.  No results for input(s): NA, K, CL, CO2, GLUCOSE, BUN, CREATININE, CALCIUM in the last 72 hours.   Intake/Output Summary (Last 24 hours) at 07/02/2020 09/01/2020 Last data filed at 07/02/2020 0600 Gross per 24 hour  Intake 480 ml  Output 475 ml  Net 5 ml         Physical Exam: BP 133/88 (BP Location: Right Arm)   Pulse 89   Temp 98 F (36.7 C) (Oral)   Resp 18   Ht 5\' 10"  (1.778 m)   Wt 105.4 kg   SpO2 92%   BMI 33.34 kg/m    General: No acute distress Mood and affect are appropriate Heart: Regular rate and rhythm no rubs murmurs or extra sounds Lungs: Clear to auscultation, breathing unlabored, no rales or wheezes Abdomen: Positive bowel sounds, soft nontender to palpation, nondistended Extremities: No clubbing, cyanosis, or edema Skin: No evidence of breakdown, no evidence of rash  Cerebellar exam normal finger to nose to finger as well as heel to shin in bilateral upper and lower extremities Musculoskeletal: Full range of motion in all 4 extremities. No joint swelling  Neuro: Alert and oriented x3 Dysarthria, stable Motor: LUE: 0/5 proximal to distal LLE: HF, KE 2-/5, ADF 1/5, unchanged Left inattention RUE/RLE: 5/5 proximal distal Sensation intact LT on RIght , absent on left    Assessment/Plan: 1. Functional deficits which require 3+ hours per day of interdisciplinary therapy in a comprehensive inpatient rehab setting. Physiatrist is providing close team supervision and 24 hour management of active medical problems listed  below. Physiatrist and rehab team continue to assess barriers to discharge/monitor patient progress toward functional and medical goals   Care Tool:  Bathing    Body parts bathed by patient: Chest, Abdomen, Left arm, Face, Front perineal area, Right lower leg, Right upper leg, Left upper leg   Body parts bathed by helper: Right arm, Buttocks Body parts n/a: Right arm, Buttocks, Left lower leg   Bathing assist Assist Level: Moderate Assistance - Patient 50 - 74%     Upper Body Dressing/Undressing Upper body dressing   What is the patient wearing?: Pull over shirt    Upper body assist Assist Level: Minimal Assistance - Patient > 75%    Lower Body Dressing/Undressing Lower body dressing      What is the patient wearing?: Incontinence brief, Pants     Lower body assist Assist for lower body dressing: Moderate Assistance - Patient 50 - 74% (with use of stedy)     Toileting Toileting    Toileting assist Assist for toileting: Moderate Assistance - Patient 50 - 74% (with use of stedy)     Transfers Chair/bed transfer  Transfers assist     Chair/bed transfer assist level: 2 Helpers     Locomotion Ambulation   Ambulation assist   Ambulation activity did not occur: Safety/medical concerns          Walk 10 feet activity   Assist  Walk 10 feet activity did not occur: Safety/medical concerns        Walk 50  feet activity   Assist Walk 50 feet with 2 turns activity did not occur: Safety/medical concerns         Walk 150 feet activity   Assist Walk 150 feet activity did not occur: Safety/medical concerns         Walk 10 feet on uneven surface  activity   Assist Walk 10 feet on uneven surfaces activity did not occur: Safety/medical concerns         Wheelchair     Assist Will patient use wheelchair at discharge?:  (TBD)             Wheelchair 50 feet with 2 turns activity    Assist            Wheelchair 150 feet  activity     Assist           Medical Problem List and Plan: 1.  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 Course complicated by seizure  Continue CIR PT, OT, SLP  WHO/PRAFO nightly 2.  Antithrombotics: -DVT/anticoagulation:  Pharmaceutical: Lovenox             -antiplatelet therapy: N/A due to bleed 3.  Ongoing headaches/pain Management: Continue Depakote and Keppra.  Topamax 25 daily started on 6/2 with benefit 4. Mood: LCSW to follow for evaluation and support             -antipsychotic agents: N/AA 5. Neuropsych: This patient is intermittently capable of making decisions on his own behalf. 6. Skin/Wound Care: Routine pressure-relief measures. Eucerin ordered for dry skin. Recommended wife bring coconut oil from home.  7. Fluids/Electrolytes/Nutrition: Monitor intake/output. Discussed anti-inflammatory diet, replacing ginger ale with water during the day and tart cherry juice at night, cranberry juice if he really wants juice during the day. 9.  Partial complex seizure: Continue Keppra 500 mg twice daily. 10.  HTN: Monitor blood pressures 3 times daily.              -- Labile, continue amlodipine, labetalol, losartan and clonidine             -SBP goal of <160 given ICH  Still in range Vitals:   07/01/20 1937 07/02/20 0455  BP: 137/75 133/88  Pulse: 76 89  Resp: 18 18  Temp: 99.5 F (37.5 C) 98 F (36.7 C)  SpO2: 100% 92%   11.  Dyslipidemia: On statin. 12.  Acute kidney injury:   Resolved  13.  History of alcohol abuse/delirium: Continue Seroquel 25 mg at bedtime to help with sleep/wake cycle. Discussed this would not be long term             -- Continue folic acid and thiamine supplementation 14.  Acute blood loss anemia:   Hemoglobin 11.8 on 6/1, stable at 11.4  Continue to monitor 15.  Morbid obesity/OHS/OSA?: Outpatient sleep study recommended for work-up 16.  Post stroke dysphagia  D3  thins, advance diet as tolerated 17. Right ankle pain: suspect sprain given tenderness over ATFL. Ice 15mg  TID. Given chronicity of pain and pain with weightbearing, ordered right ankle XR- discussed with wife that there is no evidence of fracture or athropathy. Voltaren gel ordered with minimal benefit; capsaicin also ordered to see if this provides some relief. Requested RN soak foot in epsom salt if available. Ankle support brace ordered and patient wearing. Advised to elevate when resting, minimize weightbearing while with therapy to allow ligament to heal. Provided list of anti-inflammatory foods for pain.  Discussed risks and benefits of various medications such as tylenol, NSAIDs, and prednisone and patient and wife decided on above conservative treatment and tylenol for now.   18.  Left shoulder pain exam is neg has hLeft hand swelling but no pain - at this time does not appear to be shoulder hand syndrome , avoid sleeping on left , Retyrograde massage to Left hand , may consider removing wrist splint during the day, discussed with team 19.  Urinary urgency but unable to void, suspect spastic bladder with BPH , start flomax, BP should support this med , as discussed with RN would check bladder scan as well  20. Left shoulder pain: lidocaine patch ordered, also using diclofenac gel  21, Left groin pain: ordered gabapentin 100mg  TID  LOS: 10 days A FACE TO FACE EVALUATION WAS PERFORMED  07/02/2020, 7:22 AM

## 2020-07-02 NOTE — Progress Notes (Signed)
Physical Therapy Session Note  Patient Details  Name: [name] MRN: [id number] Date of Birth: February 24, 1965  Today's Date: 07/02/2020 PT Individual Time: 0900-1000 PT Individual Time Calculation (min): 60 min   Short Term Goals: Week 2:  PT Short Term Goal 1 (Week 2): Pt will complete bed mobility with modA consistently PT Short Term Goal 2 (Week 2): Pt will complete sit<>stand transfers with modA without the use of Stedy PT Short Term Goal 3 (Week 2): Pt will complete bed<>chair transfers with maxA and LRAD PT Short Term Goal 4 (Week 2): Pt will ambulate 26ft with modA +2 with LRAD  Skilled Therapeutic Interventions/Progress Updates:    Pt greeted supine in bed to start PT tx - agreeable to PT. No reports of pain. Pt noted to be incontinent of bladder with heavily saturated brief and saturated chuck pads. Required totalA for brief change and posterior pericare but pt was able to complete frontal pericare with setupA. Required totalA for donning new shorts. Pt reports he hasn't been wearing his PRAFO boot on L foot at night despite sign above bed. RN present for morning medications and asked RN to pass along at shift change to ensure pt is wearing appropriate bracing at night. Supine<>sit completed with modA +2 with use of bed features. Requires CGA for sitting EOB due to posterior lean and safety awareness due to mild impulsivity. Completed sit<>stand in stedy with minA +1, needing assist for L foot placement to avoid ankle inversion. Stedy transfer to TIS w/c - pt able to stand from perched position with CGA. Wheeled with totalA to main rehab gym for time management. In // bars, able to stand with minA with L knee block with pt pulling up to stand with RUE on // bar. In standing, working on static standing balance with minA and L knee block - also worked lateral weight shifts L<>R with L knee block and modA for balance - L knee buckling with pt unable to correct. Next, worked on [employer]  with forward/backward stepping with LLE and then RLE. While stepping with RLE - pt had x1 large instance of L knee buckling required maxA for recovery - cues provided for taking smaller steps with RLE which improved pt's ability to control L knee in stance. While stepping with LLE, he requires assistance for placement and guiding during swing. Pt returned to room at end of session and remained seated in TIS w/c with safety belt alarm on. All needs within reach.  Therapy Documentation Precautions:  Precautions Precautions: Fall Precaution Comments: Lt hemi with right gaze preference Restrictions Weight Bearing Restrictions: No General:    Therapy/Group: Individual Therapy  Jaspal Pultz P Amar Sippel PT 07/02/2020, 7:43 AM

## 2020-07-03 NOTE — Progress Notes (Signed)
Speech Language Pathology Daily Session Note  Patient Details  Name: Colum Colt MRN: [id number] Date of Birth: November 14, 1965  Today's Date: 07/03/2020 SLP Individual Time: 0962-8366 SLP Individual Time Calculation (min): 45 min  Short Term Goals: Week 2: SLP Short Term Goal 1 (Week 2): Patient will consume current diet with minimal overt s/s of aspiration with mod I swallowing compensatory strategies. SLP Short Term Goal 2 (Week 2): Patient will demonstrate efficient mastication with minimal oral residue without overt s/s of aspiration with supervision level verbal cues over 2 sessions prior to upgrade. SLP Short Term Goal 3 (Week 2): Patient will recall new, daily information with Min verbal and visual cues SLP Short Term Goal 4 (Week 2): Patient will demonstrate sustained attention to functional tasks for 15-20 minutes wit supervision verbal cues for redirection. SLP Short Term Goal 5 (Week 2): Patient will demonstrate functional problem solving for basic and familiar tasks with Min verbal cues. SLP Short Term Goal 6 (Week 2): Pt will demonstrate self-monitoring and self-correction of functional errors in problem solving tasks with mod A verbal cues.  Skilled Therapeutic Interventions: Skilled treatment session focused on cognitive goals. Upon arrival, patient was eating breakfast and reported difficulty locating his spoon requiring Min verbal cues for left visual scanning. Patient was self-feeding his cup of fruit with limited problem solving/awareness of cup tipping over. Patient attempted to self-correct problem by yelling, "come on arm, work" at his LUE despite it being flaccid. SLP provided education. SLP also facilitated session by providing supervision level verbal cues for problem solving during a basic medication management task from the ALFA. Patient requested a list of current medications and SLP provided an organized list of his current medications and their functions. Patient left  upright in bed with alarm on and all needs within reach. Continue with current plan of care.      Pain Pain Assessment Pain Scale: 0-10 Pain Score: 3  Pain Type: Acute pain Pain Location: Head Pain Orientation: Left Pain Descriptors / Indicators: Aching;Headache Pain Frequency: Intermittent Pain Onset: On-going Patients Stated Pain Goal: 0 Pain Intervention(s): Medication (See eMAR) Multiple Pain Sites: No  Therapy/Group: Individual Therapy  Goldy Calandra 07/03/2020, 12:03 PM

## 2020-07-03 NOTE — Progress Notes (Signed)
Occupational Therapy Session Note  Patient Details  Name: [name] MRN: [id number] Date of Birth: 04-02-1965  Today's Date: 07/03/2020 OT Individual Time: 6270-3500 OT Individual Time Calculation (min): 30 min    Short Term Goals: Week 2:  OT Short Term Goal 1 (Week 2): Pt will complete transfers to toilet with squat pivot of mod A. OT Short Term Goal 2 (Week 2): Pt will be able rise to stand with mod A of 1. OT Short Term Goal 3 (Week 2): Pt will be able to don pants over his feet with min A.  Skilled Therapeutic Interventions/Progress Updates:    OT session focused on standing balance and L-NMR. Pt received supine in bed agreeable to therapy. Transitioned supine>sit with mod A and pt reporting dizziness. Educated on importance of breathing through transitional movements. Completed sit<>stand with RW at bed (slightly elevated) 2x appr. 30-45 sec increments. OT provided verbal/tactile cues for upright posture and shifting weight into LLE. Pt verbalized some low back pain with OT educating on core strengthening exercises. Transferred to w/c with use of Stedy and pt left in reclined position with safety belt and all needs in reach.   Therapy Documentation Precautions:  Precautions Precautions: Fall Precaution Comments: Lt hemi with right gaze preference Restrictions Weight Bearing Restrictions: No General:   Vital Signs:   Pain: Pain Assessment Pain Scale: 0-10 Pain Score: 3  Pain Type: Acute pain Pain Location: Head Pain Orientation: Left Pain Descriptors / Indicators: Aching;Headache Pain Frequency: Intermittent Pain Onset: On-going Patients Stated Pain Goal: 0 Pain Intervention(s): Medication (See eMAR) Multiple Pain Sites: No ADL: ADL Eating: Set up Grooming: Setup Where Assessed-Grooming: Sitting at sink Upper Body Bathing: Minimal assistance (sitting on BSC) Where Assessed-Upper Body Bathing: Sitting at sink Lower Body Bathing: Moderate assistance (sitting on  BSC) Where Assessed-Lower Body Bathing: Sitting at sink Upper Body Dressing: Minimal assistance Where Assessed-Upper Body Dressing: Sitting at sink Lower Body Dressing: Moderate assistance Where Assessed-Lower Body Dressing:  (sitting on BSC with use of stedy) Toileting: Moderate assistance (with use of stedy) Where Assessed-Toileting: Bedside Commode, Toilet Toilet Transfer: Minimal assistance Toilet Transfer Method: Other (comment) (with use of the stedy) Toilet Transfer Equipment: Bedside commode, Raised toilet seat Vision   Perception    Praxis   Exercises:   Other Treatments:     Therapy/Group: Individual Therapy  Daneil Dan 07/03/2020, 12:23 PM

## 2020-07-03 NOTE — Progress Notes (Signed)
Occupational Therapy Session Note  Patient Details  Name: [name] MRN: [id number] Date of Birth: January 03, 1966  Today's Date: 07/03/2020 OT Individual Time: 1345-1430 OT Individual Time Calculation (min): 45 min    Short Term Goals: Week 2:  OT Short Term Goal 1 (Week 2): Pt will complete transfers to toilet with squat pivot of mod A. OT Short Term Goal 2 (Week 2): Pt will be able rise to stand with mod A of 1. OT Short Term Goal 3 (Week 2): Pt will be able to don pants over his feet with min A.  Skilled Therapeutic Interventions/Progress Updates:    OT session focused on standing balance and activity tolerance. Pt completed sit<>stand in Stedy 7x with min A then completed 15-25 lateral weight shifts each time. OT provided manual techniques for upright posture and positioning of LLE to activate quads and glutes. Pt noted to hyperextend initially in standing with OT cueing for engagement of core. At end of session, pt left in tilt in space w/c with safety belt and all needs in reach.   Therapy Documentation Precautions:  Precautions Precautions: Fall Precaution Comments: Lt hemi with right gaze preference Restrictions Weight Bearing Restrictions: No General:   Vital Signs: Therapy Vitals Temp: 98.3 F (36.8 C) Temp Source: Oral Pulse Rate: 78 Resp: 17 BP: 126/84 Patient Position (if appropriate): Sitting Oxygen Therapy SpO2: 100 % O2 Device: Room Air Pain: Pain Assessment Pain Scale: 0-10 Pain Score: 3  Pain Type: Acute pain Pain Location: Head Pain Orientation: Left Pain Descriptors / Indicators: Aching;Headache Pain Frequency: Intermittent Pain Onset: On-going Patients Stated Pain Goal: 0 Pain Intervention(s): Medication (See eMAR) Multiple Pain Sites: No ADL: ADL Eating: Set up Grooming: Setup Where Assessed-Grooming: Sitting at sink Upper Body Bathing: Minimal assistance (sitting on BSC) Where Assessed-Upper Body Bathing: Sitting at sink Lower Body  Bathing: Moderate assistance (sitting on BSC) Where Assessed-Lower Body Bathing: Sitting at sink Upper Body Dressing: Minimal assistance Where Assessed-Upper Body Dressing: Sitting at sink Lower Body Dressing: Moderate assistance Where Assessed-Lower Body Dressing:  (sitting on BSC with use of stedy) Toileting: Moderate assistance (with use of stedy) Where Assessed-Toileting: Bedside Commode, Toilet Toilet Transfer: Minimal assistance Toilet Transfer Method: Other (comment) (with use of the stedy) Toilet Transfer Equipment: Bedside commode, Raised toilet seat Vision   Perception    Praxis   Exercises:   Other Treatments:     Therapy/Group: Individual Therapy  Daneil Dan 07/03/2020, 2:37 PM

## 2020-07-03 NOTE — Progress Notes (Signed)
[facility] PHYSICAL MEDICINE & REHABILITATION PROGRESS NOTE  Subjective/Complaints:  Pt still c/o shoulder and groin pain on Left.  Doing all right otherwise.  L hand pain "not bad".  Pain meds help pain.   Getting Ktape of L shoulder-   ROS:  Pt denies SOB, abd pain, CP, N/V/C/D, and vision changes   Objective: Vital Signs: Blood pressure 138/81, pulse 85, temperature 97.9 F (36.6 C), temperature source Oral, resp. rate 19, height 5\' 10"  (1.778 m), weight 105.4 kg, SpO2 100 %. No results found. No results for input(s): WBC, HGB, HCT, PLT in the last 72 hours.  No results for input(s): NA, K, CL, CO2, GLUCOSE, BUN, CREATININE, CALCIUM in the last 72 hours.   Intake/Output Summary (Last 24 hours) at 07/03/2020 1212 Last data filed at 07/03/2020 1100 Gross per 24 hour  Intake 720 ml  Output 725 ml  Net -5 ml        Physical Exam: BP 138/81 (BP Location: Right Arm)   Pulse 85   Temp 97.9 F (36.6 C) (Oral)   Resp 19   Ht 5\' 10"  (1.778 m)   Wt 105.4 kg   SpO2 100%   BMI 33.34 kg/m     General: awake, alert, appropriate, sitting up in bed; NAD HENT: conjugate gaze; oropharynx moist CV: regular rate; no JVD Pulmonary: CTA B/L; no W/R/R- good air movement GI: soft, NT, ND, (+)BS Psychiatric: appropriate- slightly flat, but interactive;  Neurological: alert Cerebellar exam normal finger to nose to finger as well as heel to shin in bilateral upper and lower extremities Musculoskeletal: Full range of motion in all 4 extremities. No joint swelling- L shoulder and L hand have Ktaping on them-   Neuro: Alert and oriented x3 Dysarthria, stable Motor: LUE: 0/5 proximal to distal LLE: HF, KE 2-/5, ADF 1/5, unchanged Left inattention RUE/RLE: 5/5 proximal distal Sensation intact LT on RIght , absent on left    Assessment/Plan: 1. Functional deficits which require 3+ hours per day of interdisciplinary therapy in a comprehensive inpatient rehab setting. Physiatrist  is providing close team supervision and 24 hour management of active medical problems listed below. Physiatrist and rehab team continue to assess barriers to discharge/monitor patient progress toward functional and medical goals   Care Tool:  Bathing    Body parts bathed by patient: Chest, Abdomen, Left arm, Face, Front perineal area, Right lower leg, Right upper leg, Left upper leg, Left lower leg (used long sponge)   Body parts bathed by helper: Right arm, Buttocks Body parts n/a: Right arm, Buttocks, Left lower leg   Bathing assist Assist Level: Minimal Assistance - Patient > 75%     Upper Body Dressing/Undressing Upper body dressing   What is the patient wearing?: Pull over shirt    Upper body assist Assist Level: Minimal Assistance - Patient > 75%    Lower Body Dressing/Undressing Lower body dressing      What is the patient wearing?: Incontinence brief, Pants     Lower body assist Assist for lower body dressing: Moderate Assistance - Patient 50 - 74%     Toileting Toileting    Toileting assist Assist for toileting: Moderate Assistance - Patient 50 - 74% (with use of stedy)     Transfers Chair/bed transfer  Transfers assist     Chair/bed transfer assist level: 2 Helpers     Locomotion Ambulation   Ambulation assist   Ambulation activity did not occur: Safety/medical concerns  Walk 10 feet activity   Assist  Walk 10 feet activity did not occur: Safety/medical concerns        Walk 50 feet activity   Assist Walk 50 feet with 2 turns activity did not occur: Safety/medical concerns         Walk 150 feet activity   Assist Walk 150 feet activity did not occur: Safety/medical concerns         Walk 10 feet on uneven surface  activity   Assist Walk 10 feet on uneven surfaces activity did not occur: Safety/medical concerns         Wheelchair     Assist Will patient use wheelchair at discharge?:  (TBD)              Wheelchair 50 feet with 2 turns activity    Assist            Wheelchair 150 feet activity     Assist           Medical Problem List and Plan: 1.  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 Course complicated by seizure  Con't PT, OT and SLP  WHO/PRAFO nightly 2.  Antithrombotics: -DVT/anticoagulation:  Pharmaceutical: Lovenox             -antiplatelet therapy: N/A due to bleed 3.  Ongoing headaches/pain Management: Continue Depakote and Keppra.  Topamax 25 daily started on 6/2 with benefit  6/11- not c/o HA"s, just shoulder/groin pain- con't regimen  4. Mood: LCSW to follow for evaluation and support             -antipsychotic agents: N/AA 5. Neuropsych: This patient is intermittently capable of making decisions on his own behalf. 6. Skin/Wound Care: Routine pressure-relief measures. Eucerin ordered for dry skin. Recommended wife bring coconut oil from home.  7. Fluids/Electrolytes/Nutrition: Monitor intake/output. Discussed anti-inflammatory diet, replacing ginger ale with water during the day and tart cherry juice at night, cranberry juice if he really wants juice during the day. 9.  Partial complex seizure: Continue Keppra 500 mg twice daily. 10.  HTN: Monitor blood pressures 3 times daily.              -- Labile, continue amlodipine, labetalol, losartan and clonidine             -SBP goal of <160 given ICH  6/11- BP controlled- con't regimen Vitals:   07/02/20 1922 07/03/20 0522  BP: 110/72 138/81  Pulse: 82 85  Resp: 18 19  Temp: 98.6 F (37 C) 97.9 F (36.6 C)  SpO2: 98% 100%   11.  Dyslipidemia: On statin. 12.  Acute kidney injury:   Resolved  13.  History of alcohol abuse/delirium: Continue Seroquel 25 mg at bedtime to help with sleep/wake cycle. Discussed this would not be long term             -- Continue folic acid and thiamine supplementation 14.  Acute blood loss anemia:    Hemoglobin 11.8 on 6/1, stable at 11.4  Continue to monitor 15.  Morbid obesity/OHS/OSA?: Outpatient sleep study recommended for work-up 16.  Post stroke dysphagia  D3 thins, advance diet as tolerated  6/11- tolerating diet- no issues- con't regimen 17. Right ankle pain: suspect sprain given tenderness over ATFL. Ice 15mg  TID. Given chronicity of pain and pain with weightbearing, ordered right ankle XR- discussed with wife that there is no evidence of fracture or athropathy. Voltaren gel ordered with minimal benefit; capsaicin  also ordered to see if this provides some relief. Requested RN soak foot in epsom salt if available. Ankle support brace ordered and patient wearing. Advised to elevate when resting, minimize weightbearing while with therapy to allow ligament to heal. Provided list of anti-inflammatory foods for pain. Discussed risks and benefits of various medications such as tylenol, NSAIDs, and prednisone and patient and wife decided on above conservative treatment and tylenol for now.   18.  Left shoulder pain exam is neg has hLeft hand swelling but no pain - at this time does not appear to be shoulder hand syndrome , avoid sleeping on left , Retyrograde massage to Left hand , may consider removing wrist splint during the day, discussed with team 19.  Urinary urgency but unable to void, suspect spastic bladder with BPH , start flomax, BP should support this med , as discussed with RN would check bladder scan as well  20. Left shoulder pain: lidocaine patch ordered, also using diclofenac gel   6/11- said pain meds working most of time- con't regimen 21, Left groin pain: ordered gabapentin 100mg  TID  6/11- said pain meds help -con't regimen  LOS: 11 days A FACE TO FACE EVALUATION WAS PERFORMED  Nyima Vanacker 07/03/2020, 12:12 PM

## 2020-07-04 MED ORDER — LIDOCAINE 5 % EX PTCH
2.0000 | MEDICATED_PATCH | CUTANEOUS | Status: DC
  Administered 2020-07-04 – 2020-07-20 (×18): 2 via TRANSDERMAL
  Filled 2020-07-04 (×18): qty 2

## 2020-07-04 NOTE — Progress Notes (Signed)
[facility] PHYSICAL MEDICINE & REHABILITATION PROGRESS NOTE  Subjective/Complaints:  Pt reports main issue is L shoulder pain and also L groin pain.   Also notes fell on L shoulder when had stroke and on L side for 2.5 hours Pain ~ 5/10- gets up to 7-8/10 at night-  Wants injections- explained might be able to get from Dr Carlis Abbott this week.   ROS:   Pt denies SOB, abd pain, CP, N/V/C/D, and vision changes   Objective: Vital Signs: Blood pressure 121/78, pulse 86, temperature (!) 97.5 F (36.4 C), resp. rate 20, height 5\' 10"  (1.778 m), weight 105.4 kg, SpO2 97 %. No results found. No results for input(s): WBC, HGB, HCT, PLT in the last 72 hours.  No results for input(s): NA, K, CL, CO2, GLUCOSE, BUN, CREATININE, CALCIUM in the last 72 hours.   Intake/Output Summary (Last 24 hours) at 07/04/2020 1238 Last data filed at 07/04/2020 0800 Gross per 24 hour  Intake 840 ml  Output 750 ml  Net 90 ml        Physical Exam: BP 121/78 (BP Location: Right Arm)   Pulse 86   Temp (!) 97.5 F (36.4 C)   Resp 20   Ht 5\' 10"  (1.778 m)   Wt 105.4 kg   SpO2 97%   BMI 33.34 kg/m      General: awake, alert, appropriate, sitting up in bedside chair, NAD HENT: conjugate gaze; oropharynx moist CV: regular rate; no JVD Pulmonary: CTA B/L; no W/R/R- good air movement GI: soft, NT, ND, (+)BS Psychiatric: appropriate; slightly flat Neurological: alert Cerebellar exam normal finger to nose to finger as well as heel to shin in bilateral upper and lower extremities Musculoskeletal: Full range of motion in all 4 extremities. No joint swelling- L shoulder and L hand have Ktaping on them- Main TTP anterior shoulder c/w biceps tendon pain  Neuro:  Dysarthria, stable Motor: LUE: 0/5 proximal to distal LLE: HF, KE 2-/5, ADF 1/5, unchanged Left inattention RUE/RLE: 5/5 proximal distal Sensation intact LT on RIght , absent on left    Assessment/Plan: 1. Functional deficits which require  3+ hours per day of interdisciplinary therapy in a comprehensive inpatient rehab setting. Physiatrist is providing close team supervision and 24 hour management of active medical problems listed below. Physiatrist and rehab team continue to assess barriers to discharge/monitor patient progress toward functional and medical goals   Care Tool:  Bathing    Body parts bathed by patient: Chest, Abdomen, Left arm, Face, Front perineal area, Right lower leg, Right upper leg, Left upper leg, Left lower leg (used long sponge)   Body parts bathed by helper: Right arm, Buttocks Body parts n/a: Right arm, Buttocks, Left lower leg   Bathing assist Assist Level: Minimal Assistance - Patient > 75%     Upper Body Dressing/Undressing Upper body dressing   What is the patient wearing?: Pull over shirt    Upper body assist Assist Level: Minimal Assistance - Patient > 75%    Lower Body Dressing/Undressing Lower body dressing      What is the patient wearing?: Incontinence brief, Pants     Lower body assist Assist for lower body dressing: Moderate Assistance - Patient 50 - 74%     Toileting Toileting    Toileting assist Assist for toileting: Moderate Assistance - Patient 50 - 74% (with use of stedy)     Transfers Chair/bed transfer  Transfers assist     Chair/bed transfer assist level: 2 Helpers  Locomotion Ambulation   Ambulation assist   Ambulation activity did not occur: Safety/medical concerns          Walk 10 feet activity   Assist  Walk 10 feet activity did not occur: Safety/medical concerns        Walk 50 feet activity   Assist Walk 50 feet with 2 turns activity did not occur: Safety/medical concerns         Walk 150 feet activity   Assist Walk 150 feet activity did not occur: Safety/medical concerns         Walk 10 feet on uneven surface  activity   Assist Walk 10 feet on uneven surfaces activity did not occur: Safety/medical  concerns         Wheelchair     Assist Will patient use wheelchair at discharge?:  (TBD)             Wheelchair 50 feet with 2 turns activity    Assist            Wheelchair 150 feet activity     Assist           Medical Problem List and Plan: 1.  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 Course complicated by seizure  Con't PT, OT and SLP  WHO/PRAFO nightly 2.  Antithrombotics: -DVT/anticoagulation:  Pharmaceutical: Lovenox             -antiplatelet therapy: N/A due to bleed 3.  Ongoing headaches/pain Management: Continue Depakote and Keppra.  Topamax 25 daily started on 6/2 with benefit  6/11- not c/o HA"s, just shoulder/groin pain- con't regimen   6/12- will add lidoderm patches to L shoulder 6pm to 6am- hurts most at night- think has at least bicipital tendinitis- suggest steroid injection- might also need of RTC/GH joint-  4. Mood: LCSW to follow for evaluation and support             -antipsychotic agents: N/AA 5. Neuropsych: This patient is intermittently capable of making decisions on his own behalf. 6. Skin/Wound Care: Routine pressure-relief measures. Eucerin ordered for dry skin. Recommended wife bring coconut oil from home.  7. Fluids/Electrolytes/Nutrition: Monitor intake/output. Discussed anti-inflammatory diet, replacing ginger ale with water during the day and tart cherry juice at night, cranberry juice if he really wants juice during the day. 9.  Partial complex seizure: Continue Keppra 500 mg twice daily. 10.  HTN: Monitor blood pressures 3 times daily.              -- Labile, continue amlodipine, labetalol, losartan and clonidine             -SBP goal of <160 given ICH  6/11-6/12- BP controlled- con't regimen Vitals:   07/03/20 1914 07/04/20 0447  BP: 127/84 121/78  Pulse: 79 86  Resp: 20 20  Temp: 97.7 F (36.5 C) (!) 97.5 F (36.4 C)  SpO2: 100% 97%    11.  Dyslipidemia: On statin. 12.  Acute kidney injury:   Resolved  13.  History of alcohol abuse/delirium: Continue Seroquel 25 mg at bedtime to help with sleep/wake cycle. Discussed this would not be long term             -- Continue folic acid and thiamine supplementation 14.  Acute blood loss anemia:   Hemoglobin 11.8 on 6/1, stable at 11.4  Continue to monitor 15.  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 Right ankle pain: suspect sprain given tenderness over ATFL. Ice 15mg  TID. Given chronicity of pain and pain with weightbearing, ordered right ankle XR- discussed with wife that there is no evidence of fracture or athropathy. Voltaren gel ordered with minimal benefit; capsaicin also ordered to see if this provides some relief. Requested RN soak foot in epsom salt if available. Ankle support brace ordered and patient wearing. Advised to elevate when resting, minimize weightbearing while with therapy to allow ligament to heal. Provided list of anti-inflammatory foods for pain. Discussed risks and benefits of various medications such as tylenol, NSAIDs, and prednisone and patient and wife decided on above conservative treatment and tylenol for now.   18.  Left shoulder pain exam is neg has hLeft hand swelling but no pain - at this time does not appear to be shoulder hand syndrome , avoid sleeping on left , Retyrograde massage to Left hand , may consider removing wrist splint during the day, discussed with team 19.  Urinary urgency but unable to void, suspect spastic bladder with BPH , start flomax, BP should support this med , as discussed with RN would check bladder scan as well  20. Left shoulder pain: lidocaine patch ordered, also using diclofenac gel   6/11- said pain meds working most of time- con't regimen  6/12- adding lidoderm- suggest  biceps tendonitis- suggest steroid injection?-  21, Left groin pain: ordered gabapentin 100mg  TID  6/11- said pain meds help -con't regimen  LOS: 12 days A FACE TO FACE EVALUATION WAS PERFORMED  [name] 07/04/2020, 12:38 PM

## 2020-07-05 ENCOUNTER — Inpatient Hospital Stay (HOSPITAL_COMMUNITY): Payer: 59

## 2020-07-05 LAB — CBC
HCT: 34.3 % — ABNORMAL LOW (ref 39.0–52.0)
Hemoglobin: 11.4 g/dL — ABNORMAL LOW (ref 13.0–17.0)
MCH: 31.2 pg (ref 26.0–34.0)
MCHC: 33.2 g/dL (ref 30.0–36.0)
MCV: 94 fL (ref 80.0–100.0)
Platelets: 329 10*3/uL (ref 150–400)
RBC: 3.65 MIL/uL — ABNORMAL LOW (ref 4.22–5.81)
RDW: 11.9 % (ref 11.5–15.5)
WBC: 5.2 10*3/uL (ref 4.0–10.5)
nRBC: 0 % (ref 0.0–0.2)

## 2020-07-05 LAB — BASIC METABOLIC PANEL
Anion gap: 10 (ref 5–15)
BUN: 11 mg/dL (ref 6–20)
CO2: 24 mmol/L (ref 22–32)
Calcium: 9.6 mg/dL (ref 8.9–10.3)
Chloride: 106 mmol/L (ref 98–111)
Creatinine, Ser: 1.15 mg/dL (ref 0.61–1.24)
GFR, Estimated: >60 mL/min (ref >60)
Glucose, Bld: 94 mg/dL (ref 70–99)
Potassium: 3.9 mmol/L (ref 3.5–5.1)
Sodium: 140 mmol/L (ref 135–145)

## 2020-07-05 MED ORDER — LIDOCAINE HCL 1 % IJ SOLN
5.0000 mL | Freq: Once | INTRAMUSCULAR | Status: DC
  Filled 2020-07-05: qty 5

## 2020-07-05 MED ORDER — TRIAMCINOLONE ACETONIDE 40 MG/ML IJ SUSP
40.0000 mg | Freq: Once | INTRAMUSCULAR | Status: AC
  Administered 2020-07-05: 40 mg via INTRA_ARTICULAR
  Filled 2020-07-05: qty 1

## 2020-07-05 MED ORDER — LIDOCAINE HCL (PF) 1 % IJ SOLN
5.0000 mL | Freq: Once | INTRAMUSCULAR | Status: AC
  Administered 2020-07-05: 5 mL via INTRADERMAL
  Filled 2020-07-05: qty 5

## 2020-07-05 NOTE — Progress Notes (Signed)
Occupational Therapy Session Note  Patient Details  Name: [name] MRN: [id number] Date of Birth: 1965-03-01  Today's Date: 07/05/2020 OT Individual Time: 1000-1100 OT Individual Time Calculation (min): 60 min    Short Term Goals: Week 2:  OT Short Term Goal 1 (Week 2): Pt will complete transfers to toilet with squat pivot of mod A. OT Short Term Goal 2 (Week 2): Pt will be able rise to stand with mod A of 1. OT Short Term Goal 3 (Week 2): Pt will be able to don pants over his feet with min A.  Skilled Therapeutic Interventions/Progress Updates:    Pt received in bed and discussed his ongoing L groin and sh pain but that he was also eager to do therapy.  Pt worked on rolling onto R side with CGA and then pushing up to sit with mod A.  He did extremely well pushing up to stand with min A with support to hold L arm and support at L knee to avoid buckling. In standing, managed pants down/up hips with min A.  From sitting EOB, worked on donning pants over feet with mod A.  Practiced lateral squat pivots to scoot down bed 3x to L and 3x to R 2x prior to working on squat pivot to wc then wc >< toilet with grab bar. Pt did very well following directions and using good technique of forward lean and keeping his gaze forward.  With this he actually completed all scoots and squat pivots with only MIN A! He lifted his body wt, and therapist provided support through L side.   Discussed that he can practice this transfer method with therapy for several days and then integrate nursing using this vs the stedy.   Pt is developing more finger flex/ext, elbow flexion and sh activity to push arm forward and back with gravity eliminated. Set table up at side of his wc and placed L arm on table with towel under for pt to work on self a/arom exercises.  Cued pt to do a few reps then rest. Pt in wc with all needs met, alarm set, call light and phone within reach on R side.   Therapy Documentation Precautions:   Precautions Precautions: Fall Precaution Comments: Lt hemi with right gaze preference Restrictions Weight Bearing Restrictions: No    Vital Signs: Therapy Vitals Temp: 98.8 F (37.1 C) Temp Source: Oral Pulse Rate: 75 Resp: 16 BP: 137/84 Patient Position (if appropriate): Lying Oxygen Therapy SpO2: 95 % O2 Device: Room Air Pain: Pain Assessment Pain Scale: 0-10 Pain Score: 5  Pain Type: Acute pain Pain Location: Arm Pain Orientation: Left Pain Descriptors / Indicators: Aching Pain Frequency: Constant Pain Onset: On-going Patients Stated Pain Goal: 3 Pain Intervention(s): Medication (See eMAR)    Therapy/Group: Individual Therapy  [place] 07/05/2020, 8:43 AM

## 2020-07-05 NOTE — Progress Notes (Signed)
**  Late Entry**  06/29/20 1734  What Happened  Was fall witnessed? No  Was patient injured? No  Patient found on floor  Found by Staff-comment  Stated prior activity other (comment) (Patient sitting in recliner, reaching for object)  Follow Up  MD notified Deatra Ina, PA  Time MD notified [phone]  Family notified Yes - comment Albertina Parr)  Time family notified [phone]  Additional tests No  Progress note created (see row info) Yes  Adult Fall Risk Assessment  Risk Factor Category (scoring not indicated) Fall has occurred during this admission (document High fall risk)  Patient Fall Risk Level High fall risk  Adult Fall Risk Interventions  Required Bundle Interventions *See Row Information* High fall risk - low, moderate, and high requirements implemented  Additional Interventions HeadStart chair sensor (education/return demonstration);Lap belt while in chair/wheelchair (Rehab only);Use of appropriate toileting equipment (bedpan, BSC, etc.)  Screening for Fall Injury Risk (To be completed on HIGH fall risk patients) - Assessing Need for Floor Mats  Risk For Fall Injury- Criteria for Floor Mats Previous fall this admission  Vitals  Temp 98.3 F (36.8 C)  Temp Source Oral  BP 140/83  MAP (mmHg) 98  BP Location Right Arm  BP Method Automatic  Patient Position (if appropriate) Lying  Pulse Rate 73  Pulse Rate Source Dinamap  Resp 16  Oxygen Therapy  SpO2 99 %  O2 Device Room Air

## 2020-07-05 NOTE — Progress Notes (Signed)
Physical Therapy Session Note  Patient Details  Name: [name] MRN: [id number] Date of Birth: [birth year]  Today's Date: 07/05/2020 PT Individual Time: 1300-1414 PT Individual Time Calculation (min): 74 min   Short Term Goals: Week 2:  PT Short Term Goal 1 (Week 2): Pt will complete bed mobility with modA consistently PT Short Term Goal 2 (Week 2): Pt will complete sit<>stand transfers with modA without the use of Stedy PT Short Term Goal 3 (Week 2): Pt will complete bed<>chair transfers with maxA and LRAD PT Short Term Goal 4 (Week 2): Pt will ambulate 72ft with modA +2 with LRAD  Skilled Therapeutic Interventions/Progress Updates:     Notified by other PT that wife requested to speak with this PT (primary therapist). Called wife at start of session - reviewed pt's PT Goals, current mobility status, level of care needed for ADLs and functional mobility, DC planning for home, role of f/u therapies, caregiver support, etc. All questions and concerns addressed at time of call - scheduled family ed for Wednesday beginning at 2pm - blocked off on scheduling. Pt greeted seated in TIS w/c, agreeable to PT tx. No reports of pain. Wheeled to main rehab gym where he completed squat<>pivot transfer with modA from TIS w/c to mat table - poor buttock clearance but improved ability to initiate lateal hip facilitation - used over the back technique for squat<>pivot transfer. Next, completed 2x10 sit<>stands from lowered mat table height with minA and no AD - minA needed for guarding trunk and L knee block. Next, worked on lateral scoots along mat table with CGA - working on lifting hips and pivoting hips along mat table to assist with carryover into functional transfers - he needed assist for L foot placement during this. Ended session working on [employer] and Investment banker, operational. With Fara Boros, worked on static standing marching L & R, 3x5 reps each leg, requiring min/modA guard for balance with +2 assist for  stabilizing Fara Boros. Progressed to gait training where he ambulated 35ft with +2 assist for gait mechanics and +3 assist for stabilizing Fara Boros. With gait, needed assistance for facilitating LLE swing and LLE foot placement as well as lateral weight shifting to offload to advance limbs.  Pt returned to room at end of session and remained seated in TIS w/c at end of session with safety belt alarm on and his needs within reach.   Therapy Documentation Precautions:  Precautions Precautions: Fall Precaution Comments: Lt hemi with right gaze preference Restrictions Weight Bearing Restrictions: No General:    Therapy/Group: Individual Therapy  Julus Kelley P Delorus Langwell PT  07/05/2020, 7:34 AM

## 2020-07-05 NOTE — Progress Notes (Signed)
[place] PHYSICAL MEDICINE & REHABILITATION PROGRESS NOTE  Subjective/Complaints: Updated wife today She asks about disability forms and explained that we would help her with this. She asks about left shoulder injection for patient- we did not have appropriate needle in hospital so I will bring from clinic and perform tomorrow.   ROS:   Pt denies SOB, abd pain, CP, N/V/C/D, and vision changes, +left shoulder pain   Objective: Vital Signs: Blood pressure 137/84, pulse 75, temperature 98.8 F (37.1 C), temperature source Oral, resp. rate 16, height 5\' 10"  (1.778 m), weight 105.4 kg, SpO2 95 %. No results found. Recent Labs    07/05/20 0535  WBC 5.2  HGB 11.4*  HCT 34.3*  PLT 329    Recent Labs    07/05/20 0535  NA 140  K 3.9  CL 106  CO2 24  GLUCOSE 94  BUN 11  CREATININE 1.15  CALCIUM 9.6     Intake/Output Summary (Last 24 hours) at 07/05/2020 1010 Last data filed at 07/05/2020 0900 Gross per 24 hour  Intake 680 ml  Output 2400 ml  Net -1720 ml        Physical Exam: BP 137/84 (BP Location: Right Arm)   Pulse 75   Temp 98.8 F (37.1 C) (Oral)   Resp 16   Ht 5\' 10"  (1.778 m)   Wt 105.4 kg   SpO2 95%   BMI 33.34 kg/m   Gen: no distress, normal appearing HEENT: oral mucosa pink and moist, NCAT Cardio: Reg rate Chest: normal effort, normal rate of breathing Abd: soft, non-distended Ext: no edema Psych: pleasant, normal affect Skin: intact Neuro: Cerebellar exam normal finger to nose to finger as well as heel to shin in bilateral upper and lower extremities Musculoskeletal: Full range of motion in all 4 extremities. No joint swelling- L shoulder and L hand have Ktaping on them- Main TTP anterior shoulder c/w biceps tendon pain  Neuro:  Dysarthria, stable Motor: LUE: 0/5 proximal to distal LLE: HF, KE 2-/5, ADF 1/5, unchanged Left inattention RUE/RLE: 5/5 proximal distal Sensation intact LT on RIght , absent on left    Assessment/Plan: 1.  Functional deficits which require 3+ hours per day of interdisciplinary therapy in a comprehensive inpatient rehab setting. Physiatrist is providing close team supervision and 24 hour management of active medical problems listed below. Physiatrist and rehab team continue to assess barriers to discharge/monitor patient progress toward functional and medical goals   Care Tool:  Bathing    Body parts bathed by patient: Chest, Abdomen, Left arm, Face, Front perineal area, Right lower leg, Right upper leg, Left upper leg, Left lower leg (used long sponge)   Body parts bathed by helper: Right arm, Buttocks Body parts n/a: Right arm, Buttocks, Left lower leg   Bathing assist Assist Level: Minimal Assistance - Patient > 75%     Upper Body Dressing/Undressing Upper body dressing   What is the patient wearing?: Pull over shirt    Upper body assist Assist Level: Minimal Assistance - Patient > 75%    Lower Body Dressing/Undressing Lower body dressing      What is the patient wearing?: Incontinence brief, Pants     Lower body assist Assist for lower body dressing: Moderate Assistance - Patient 50 - 74%     Toileting Toileting    Toileting assist Assist for toileting: Moderate Assistance - Patient 50 - 74% (with use of stedy)     Transfers Chair/bed transfer  Transfers assist  Chair/bed transfer assist level: 2 Helpers     Locomotion Ambulation   Ambulation assist   Ambulation activity did not occur: Safety/medical concerns          Walk 10 feet activity   Assist  Walk 10 feet activity did not occur: Safety/medical concerns        Walk 50 feet activity   Assist Walk 50 feet with 2 turns activity did not occur: Safety/medical concerns         Walk 150 feet activity   Assist Walk 150 feet activity did not occur: Safety/medical concerns         Walk 10 feet on uneven surface  activity   Assist Walk 10 feet on uneven surfaces activity did  not occur: Safety/medical concerns         Wheelchair     Assist Will patient use wheelchair at discharge?:  (TBD)             Wheelchair 50 feet with 2 turns activity    Assist            Wheelchair 150 feet activity     Assist           Medical Problem List and Plan: 1.  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 Course complicated by seizure  Continue PT, OT and SLP  WHO/PRAFO nightly 2.  Impaired mobility -DVT/anticoagulation:  Pharmaceutical: Continue Lovenox             -antiplatelet therapy: N/A due to bleed 3.  Ongoing headaches/pain Management: Continue Depakote and Keppra.  Topamax 25 daily started on 6/2 with benefit  6/11- not c/o HA"s, just shoulder/groin pain- con't regimen   6/12- will add lidoderm patches to L shoulder 6pm to 6am- hurts most at night- think has at least bicipital tendinitis- suggest steroid injection- might also need of RTC/GH joint-   6/13: left shoulder XR ordered.  4. Mood: LCSW to follow for evaluation and support             -antipsychotic agents: N/AA 5. Neuropsych: This patient is intermittently capable of making decisions on his own behalf. 6. Skin/Wound Care: Routine pressure-relief measures. Eucerin ordered for dry skin. Recommended wife bring coconut oil from home.  7. Fluids/Electrolytes/Nutrition: Monitor intake/output. Discussed anti-inflammatory diet, replacing ginger ale with water during the day and tart cherry juice at night, cranberry juice if he really wants juice during the day. 9.  Partial complex seizure: Continue Keppra 500 mg twice daily. 10.  HTN: Monitor blood pressures 3 times daily.              -- Labile, continue amlodipine, labetalol, losartan and clonidine             -SBP goal of <160 given ICH  6/11-6/13- BP controlled- continue regimen Vitals:   07/05/20 0500 07/05/20 0526  BP: 137/84 137/84  Pulse: 75 75  Resp:  16 16  Temp: 98.8 F (37.1 C) 98.8 F (37.1 C)  SpO2: 95% 95%   11.  Dyslipidemia: On statin. 12.  Acute kidney injury:   Resolved  13.  History of alcohol abuse/delirium: Continue Seroquel 25 mg at bedtime to help with sleep/wake cycle. Discussed this would not be long term             -- Continue folic acid and thiamine supplementation 14.  Acute blood loss anemia:   Hemoglobin 11.8 on 6/1, stable at 11.4  Continue to  monitor 15.  Morbid obesity/OHS/OSA?: Outpatient sleep study recommended for work-up 16.  Post stroke dysphagia  D3 thins, advance diet as tolerated  6/11- tolerating diet- no issues- con't regimen 17. Right ankle pain: suspect sprain given tenderness over ATFL. Ice 15mg  TID. Given chronicity of pain and pain with weightbearing, ordered right ankle XR- discussed with wife that there is no evidence of fracture or athropathy. Voltaren gel ordered with minimal benefit; capsaicin also ordered to see if this provides some relief. Requested RN soak foot in epsom salt if available. Ankle support brace ordered and patient wearing. Advised to elevate when resting, minimize weightbearing while with therapy to allow ligament to heal. Provided list of anti-inflammatory foods for pain. Discussed risks and benefits of various medications such as tylenol, NSAIDs, and prednisone and patient and wife decided on above conservative treatment and tylenol for now.   18.  Left shoulder pain exam is neg has hLeft hand swelling but no pain - at this time does not appear to be shoulder hand syndrome , avoid sleeping on left , Retyrograde massage to Left hand , may consider removing wrist splint during the day, discussed with team 19.  Urinary urgency but unable to void, suspect spastic bladder with BPH , start flomax, BP should support this med , as discussed with RN would check bladder scan as well  20. Left shoulder pain: lidocaine patch ordered, also using diclofenac gel   6/11- said pain meds  working most of time- con't regimen  6/12- adding lidoderm- suggest biceps tendonitis- suggest steroid injection?-  21. Confusion in morning as per wife: d/c Gabapentin.   LOS: 13 days A FACE TO FACE EVALUATION WAS PERFORMED  [name] 07/05/2020, 10:10 AM

## 2020-07-05 NOTE — Progress Notes (Signed)
Speech Language Pathology Daily Session Note  Patient Details  Name: [name] MRN: [id number] Date of Birth: [birth year]  Today's Date: 07/05/2020 SLP Individual Time: 8921-1941 SLP Individual Time Calculation (min): 57 min  Short Term Goals: Week 2: SLP Short Term Goal 1 (Week 2): Patient will consume current diet with minimal overt s/s of aspiration with mod I swallowing compensatory strategies. SLP Short Term Goal 2 (Week 2): Patient will demonstrate efficient mastication with minimal oral residue without overt s/s of aspiration with supervision level verbal cues over 2 sessions prior to upgrade. SLP Short Term Goal 3 (Week 2): Patient will recall new, daily information with Min verbal and visual cues SLP Short Term Goal 4 (Week 2): Patient will demonstrate sustained attention to functional tasks for 15-20 minutes wit supervision verbal cues for redirection. SLP Short Term Goal 5 (Week 2): Patient will demonstrate functional problem solving for basic and familiar tasks with Min verbal cues. SLP Short Term Goal 6 (Week 2): Pt will demonstrate self-monitoring and self-correction of functional errors in problem solving tasks with mod A verbal cues.  Skilled Therapeutic Interventions:Skilled ST services focused on cognitive skills. SLP facilitated PO consumption of regular textured snack, pt required min A verbal cues to clear left buccal pocketing residue and utilize finger sweep to remove very mild residue that was unable to be removed with mod I use of liquid wash and lingual sweeps. SLP recommends regular texture trial try in upcoming sessions. SLP also facilitated mildly complex problem solving, error awareness and emerging alternating attention in familiar PEG design task. Pt required min A fading to supervision A verbal cues and increase of errors noted when attempting to alternate attention however most errors were self corrected. Pt was left in room with X-Ray staff, call bell within  reach and chair alarm set. SLP recommends to continue skilled services.     Pain Pain Assessment Pain Score: 0-No pain  Therapy/Group: Individual Therapy  Ranulfo Kall  [facility] 07/05/2020, 3:39 PM

## 2020-07-06 MED ORDER — TOPIRAMATE 25 MG PO TABS
25.0000 mg | ORAL_TABLET | Freq: Once | ORAL | Status: AC
  Administered 2020-07-06: 25 mg via ORAL
  Filled 2020-07-06: qty 1

## 2020-07-06 MED ORDER — TOPIRAMATE 25 MG PO TABS
50.0000 mg | ORAL_TABLET | Freq: Every day | ORAL | Status: DC
  Administered 2020-07-07 – 2020-07-21 (×15): 50 mg via ORAL
  Filled 2020-07-06 (×15): qty 2

## 2020-07-06 NOTE — Progress Notes (Signed)
Occupational Therapy Session Note  Patient Details  Name: [name] MRN: [id number] Date of Birth: 03-Mar-1965  Today's Date: 07/06/2020 OT Individual Time: 1335-1445 OT Individual Time Calculation (min): 70 min    Short Term Goals: Week 2:  OT Short Term Goal 1 (Week 2): Pt will complete transfers to toilet with squat pivot of mod A. OT Short Term Goal 2 (Week 2): Pt will be able rise to stand with mod A of 1. OT Short Term Goal 3 (Week 2): Pt will be able to don pants over his feet with min A.   Skilled Therapeutic Interventions/Progress Updates:    Pt sitting up in TIS w/c c/o moderate low back pain.Wife present stating "I need to learn how to get him in and out of bed". OT treatment focused on caregiver education on functional mobility w/c<>EOB and sit<>supine at bed level.  OT provided visual and verbal instruction on proper body mechanics of pt and caregiver and completed squat pivot w/c to EOB with pt requiring min assist and mod Vcs.  Mod assist sit to supine and step by step cues for body mechanics.  Pt and caregiver instructed on bridging technique to reposition towards [facility] and proper positioning of LLE due to weak adductors.  Pt completed supine to sit with mod assist from therapist with wife observing technique.  Pts wife attempted to assist pt with squat pivot transfer EOB to w/c with max multimodal cues provided by therapist and therapist positioned posteriorly to assist physically as needed for safety.  Pts wife having difficulty with technique and lifting pt upwards instead of as instructed laterally needing max assist from OT to successfully complete transfer and reposition pt in w/c.  Kinesiotape applied to left shoulder to reduce pain and facilitate rotator cuff/deltoid activation.  Pt requesting to use urinal at end of session and required max assist to position and clothing mgt.  Call bell in reach, left half lap tray applied with LUE positioned in neutral, and seat belt  alarm on.  CP applied to lower back at end of session and instructed nurse tech to remove and assess skin after 20 minutes of use.  Therapy Documentation Precautions:  Precautions Precautions: Fall Precaution Comments: Lt hemi with right gaze preference Restrictions Weight Bearing Restrictions: No   Therapy/Group: Individual Therapy  Amie Critchley 07/06/2020, 3:19 PM

## 2020-07-06 NOTE — Progress Notes (Signed)
Occupational Therapy Session Note  Patient Details  Name: [name] MRN: [id number] Date of Birth: Sep 09, 1965  Today's Date: 07/06/2020 OT Individual Time: 0900-1000 OT Individual Time Calculation (min): 60 min    Short Term Goals: Week 2:  OT Short Term Goal 1 (Week 2): Pt will complete transfers to toilet with squat pivot of mod A. OT Short Term Goal 2 (Week 2): Pt will be able rise to stand with mod A of 1. OT Short Term Goal 3 (Week 2): Pt will be able to don pants over his feet with min A.  Skilled Therapeutic Interventions/Progress Updates:    Pt seen this session for ADL training with +2 A from another OT. Pt is making excellent physical progress, but continues to need +2 A as pt tends to move quickly and suddenly and due to hemiparetic side and TIS wc, difficult to do ADLS with 1 person at this time.   See ADL documentation for details. Overall he used squat pivot transfers from bed to chair and chair >< tub bench with min A overall as he needs A to support L leg into neutral and weight of L arm. Pt actually lifts his own body weight and is able to swivel his hips.   Pt continues to have slight edema in hand but now has active wrist extension to 10 degrees so encouraged pt to not use L wrist splint as much during the day to encourage movement.  Pt overall making good progress but needs more therapy time to progress to a level that his wife can care for him at home.   Pt resting in wc and hand off to next therapist.   Therapy Documentation Precautions:  Precautions Precautions: Fall Precaution Comments: Lt hemi with right gaze preference Restrictions Weight Bearing Restrictions: No    Vital Signs: Therapy Vitals Temp: 99 F (37.2 C) Temp Source: Oral Pulse Rate: 79 Resp: 17 BP: (!) 156/90 Patient Position (if appropriate): Sitting Oxygen Therapy SpO2: 100 % O2 Device: Room Air Pain: Pain Assessment Pain Scale: 0-10 Pain Score: 0-No pain ADL: ADL Eating: Set  up Grooming: Setup Where Assessed-Grooming: Sitting at sink Upper Body Bathing: Supervision/safety Where Assessed-Upper Body Bathing: Shower Lower Body Bathing: Minimal assistance Where Assessed-Lower Body Bathing: Shower Upper Body Dressing: Minimal assistance Where Assessed-Upper Body Dressing: Sitting at sink Lower Body Dressing: Moderate assistance Where Assessed-Lower Body Dressing: Wheelchair Toileting: Moderate assistance Where Assessed-Toileting: Bedside Commode, Toilet Toilet Transfer: Minimal assistance Toilet Transfer Method: Squat pivot Toilet Transfer Equipment: Grab bars, Raised toilet seat Film/video editor: Moderate assistance Film/video editor Method: Administrator: Emergency planning/management officer, Grab bars   Therapy/Group: Individual Therapy  Bayli Quesinberry 07/06/2020, 1:05 PM

## 2020-07-06 NOTE — Progress Notes (Signed)
Patient ID: [name], male   DOB: April 14, 1965, 55 y.o.   MRN: [id number]  Met with pt and wife in the room to discuss team conference progress this week. He and wife are pleased and made aware extended for one week and new discharge date is 6/29. Both relieved with new date felt too soon and too much care to go home next week. Wife has been here today and observed in therapies, to see level he is at currently. Aware of grounds pass for this weekend and much appreciative, can see his grandchildren on Sunday. Continue to work on discharge needs and family education.

## 2020-07-06 NOTE — Progress Notes (Signed)
[facility] PHYSICAL MEDICINE & REHABILITATION PROGRESS NOTE  Subjective/Complaints: Wife at bedside- explained to her and patient shoulder XR results showing no arthritis, mild subluxation- continue lidocaine patches for now and steroid not indicated Add kpad for lower back pain  ROS:   Pt denies SOB, abd pain, CP, N/V/C/D, and vision changes, +left shoulder pain, right ankle pain has resolved   Objective: Vital Signs: Blood pressure 123/78, pulse 79, temperature 98.2 F (36.8 C), temperature source Oral, resp. rate 18, height 5\' 10"  (1.778 m), weight 105.4 kg, SpO2 98 %. DG Shoulder 1V Left  Result Date: 07/05/2020 CLINICAL DATA:  Left shoulder pain EXAM: LEFT SHOULDER COMPARISON:  None. FINDINGS: Degenerative changes of the acromioclavicular joint are noted. The humeral head is slightly subluxed inferiorly although not truly dislocated. Soft tissues fall. Left chest wall is unremarkable as well. IMPRESSION: No acute bony abnormality is noted. Mild subluxation of the humeral head is seen. Electronically Signed   By: 07/07/2020 M.D.   On: 07/05/2020 16:02   Recent Labs    07/05/20 0535  WBC 5.2  HGB 11.4*  HCT 34.3*  PLT 329    Recent Labs    07/05/20 0535  NA 140  K 3.9  CL 106  CO2 24  GLUCOSE 94  BUN 11  CREATININE 1.15  CALCIUM 9.6     Intake/Output Summary (Last 24 hours) at 07/06/2020 1141 Last data filed at 07/06/2020 07/08/2020 Gross per 24 hour  Intake 600 ml  Output 1325 ml  Net -725 ml        Physical Exam: BP 123/78 (BP Location: Right Arm)   Pulse 79   Temp 98.2 F (36.8 C) (Oral)   Resp 18   Ht 5\' 10"  (1.778 m)   Wt 105.4 kg   SpO2 98%   BMI 33.34 kg/m   Gen: no distress, normal appearing HEENT: oral mucosa pink and moist, NCAT Cardio: Reg rate Chest: normal effort, normal rate of breathing Abd: soft, non-distended Ext: no edema Psych: pleasant, normal affect Neuro: Cerebellar exam normal finger to nose to finger as well as heel to shin  in bilateral upper and lower extremities Musculoskeletal: Full range of motion in all 4 extremities. No joint swelling- L shoulder and L hand have Ktaping on them- Main TTP anterior shoulder c/w biceps tendon pain. Left shoulder with mild subluxation  Neuro:  Dysarthria, stable Motor: LUE: 0/5 proximal to distal LLE: HF, KE 2-/5, ADF 1/5, unchanged Left inattention RUE/RLE: 5/5 proximal distal Sensation intact LT on RIght , absent on left    Assessment/Plan: 1. Functional deficits which require 3+ hours per day of interdisciplinary therapy in a comprehensive inpatient rehab setting. Physiatrist is providing close team supervision and 24 hour management of active medical problems listed below. Physiatrist and rehab team continue to assess barriers to discharge/monitor patient progress toward functional and medical goals   Care Tool:  Bathing    Body parts bathed by patient: Chest, Abdomen, Left arm, Face, Front perineal area, Right lower leg, Right upper leg, Left upper leg, Left lower leg (used long sponge)   Body parts bathed by helper: Right arm, Buttocks Body parts n/a: Right arm, Buttocks, Left lower leg   Bathing assist Assist Level: Minimal Assistance - Patient > 75%     Upper Body Dressing/Undressing Upper body dressing   What is the patient wearing?: Pull over shirt    Upper body assist Assist Level: Minimal Assistance - Patient > 75%    Lower Body  Dressing/Undressing Lower body dressing      What is the patient wearing?: Incontinence brief, Pants     Lower body assist Assist for lower body dressing: Moderate Assistance - Patient 50 - 74%     Toileting Toileting    Toileting assist Assist for toileting: Moderate Assistance - Patient 50 - 74% (with use of stedy)     Transfers Chair/bed transfer  Transfers assist     Chair/bed transfer assist level: Minimal Assistance - Patient > 75% (squat pivot)     Locomotion Ambulation   Ambulation assist    Ambulation activity did not occur: Safety/medical concerns          Walk 10 feet activity   Assist  Walk 10 feet activity did not occur: Safety/medical concerns        Walk 50 feet activity   Assist Walk 50 feet with 2 turns activity did not occur: Safety/medical concerns         Walk 150 feet activity   Assist Walk 150 feet activity did not occur: Safety/medical concerns         Walk 10 feet on uneven surface  activity   Assist Walk 10 feet on uneven surfaces activity did not occur: Safety/medical concerns         Wheelchair     Assist Will patient use wheelchair at discharge?:  (TBD)             Wheelchair 50 feet with 2 turns activity    Assist            Wheelchair 150 feet activity     Assist           Medical Problem List and Plan: 1.  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 Course complicated by seizure  Continue PT, OT and SLP  WHO/PRAFO nightly  -Interdisciplinary Team Conference today  2.  Impaired mobility -DVT/anticoagulation:  Pharmaceutical: Continue Lovenox             -antiplatelet therapy: N/A due to bleed 3.  Ongoing headaches/pain Management: Continue Depakote and Keppra.  Increase Topamax to 50mg  4. Mood: LCSW to follow for evaluation and support             -antipsychotic agents: N/AA 5. Neuropsych: This patient is intermittently capable of making decisions on his own behalf. 6. Skin/Wound Care: Routine pressure-relief measures. Eucerin ordered for dry skin. Recommended wife bring coconut oil from home.  7. Fluids/Electrolytes/Nutrition: Monitor intake/output. Discussed anti-inflammatory diet, replacing ginger ale with water during the day and tart cherry juice at night, cranberry juice if he really wants juice during the day. 9.  Partial complex seizure: Continue Keppra 500 mg twice daily. 10.  HTN: Monitor blood pressures 3  times daily.              -- Labile, continue amlodipine, labetalol, losartan and clonidine             -SBP goal of <160 given ICH  6/11-6/13- BP controlled- continue regimen Vitals:   07/05/20 2000 07/06/20 0516  BP: 137/73 123/78  Pulse: 83 79  Resp: 20 18  Temp: 98.4 F (36.9 C) 98.2 F (36.8 C)  SpO2: 100% 98%   11.  Dyslipidemia: On statin. 12.  Acute kidney injury:   Resolved  13.  History of alcohol abuse/delirium: Continue Seroquel 25 mg at bedtime to help with sleep/wake cycle. Discussed this would not be long term             --  Continue folic acid and thiamine supplementation 14.  Acute blood loss anemia:   Hemoglobin 11.8 on 6/1, stable at 11.4  Continue to monitor 15.  Morbid obesity/OHS/OSA?: Outpatient sleep study recommended for work-up 16.  Post stroke dysphagia  D3 thins, advance diet as tolerated  6/11- tolerating diet- no issues- con't regimen 17. Right ankle pain: suspect sprain given tenderness over ATFL. Ice 15mg  TID. Given chronicity of pain and pain with weightbearing, ordered right ankle XR- discussed with wife that there is no evidence of fracture or athropathy. Continue voltaren gel, d/c capsaicin. Requested RN soak foot in epsom salt if available. Ankle support brace ordered and patient wearing. Advised to elevate when resting, minimize weightbearing while with therapy to allow ligament to heal. Provided list of anti-inflammatory foods for pain. Discussed risks and benefits of various medications such as tylenol, NSAIDs, and prednisone and patient and wife decided on above conservative treatment and tylenol for now.   18.  Left shoulder pain exam is neg has hLeft hand swelling but no pain - at this time does not appear to be shoulder hand syndrome , avoid sleeping on left , Retyrograde massage to Left hand , may consider removing wrist splint during the day, discussed with team 19.  Urinary urgency but unable to void, suspect spastic bladder with BPH , start  flomax, BP should support this med , as discussed with RN would check bladder scan as well  20. Left shoulder pain: continue lidocaine and voltaren gel. Advised wife to bring in blue emu oil at home. Discussed XR results: mild subluxation  21. Confusion in morning as per wife: d/c Gabapentin.  22. Disposition: HFU scheduled with me on 09/17/20 1:00PM.  36 minutes spent in discussion of left shoulder pain, XR results, management; FMLA and short term disability paperwork, finding work that involves cognition> physical strength in the interim; resolution of right ankle pain, d/c of capsaicin, >50% time spent in direct patient care  LOS: 14 days A FACE TO FACE EVALUATION WAS PERFORMED  09/19/20 P Edyn Popoca 07/06/2020, 11:41 AM

## 2020-07-06 NOTE — Progress Notes (Signed)
Physical Therapy Session Note  Patient Details  Name: [name] MRN: [id number] Date of Birth: 03/27/1965  Today's Date: 07/06/2020 PT Individual Time: 1000-1115 PT Individual Time Calculation (min): 75 min   Short Term Goals: Week 2:  PT Short Term Goal 1 (Week 2): Pt will complete bed mobility with modA consistently PT Short Term Goal 2 (Week 2): Pt will complete sit<>stand transfers with modA without the use of Stedy PT Short Term Goal 3 (Week 2): Pt will complete bed<>chair transfers with maxA and LRAD PT Short Term Goal 4 (Week 2): Pt will ambulate 66ft with modA +2 with LRAD  Skilled Therapeutic Interventions/Progress Updates:    Pt greeted seated in TIS w/c at start of session - handoff from OT who finished assisting pt with bathing. Pt required totalA for donning shoes and ankle brace to start session. He was then wheeled with totalA in TIS w/c to main rehab gym inside // bars. With L knee block, able to perform sit<>stands in // bars with minA with cues needed for upright posture in standing. Next, worked on [employer] with forward/backwards stepping with L foot and then R foot - required modA +2 with hand-over-hand for LUE grip to // bar and L knee block. Pt with great difficulty producing stepping motion with LLE and decreased ability for accuracy with foot placement. Next, worked on gait training in // bars where he required a heavy +2 modA for ambulating length of // bars twice (2x72ft). With gait, he required assistance for lateral weight shifting and increased assist for L stepping > R stepping - maintained L knee block throughout terminal stance of gait cycle. Cues throughout for core/glut engagement and postural awareness.   Wife arriving mid-way through session who was present to observe gait session in // bars. With wife present, focused remainder of session on family/pt education and answering questions and concerns for upcoming DC. Also provided patient with standard  20x18 w/c with 1/2 lap tray for LUE. Pt completed squat<>pivot with modA from TIS w/c to mat table and the modA again from mat table to standard w/c. Squat<>pivot transfer completed via over-the-back technique. Educated wife on w/c parts and w/c management. Pt then worked on self propelling in w/c where he was able to propel himself ~72ft with modA for maintaining straight path for power as pt used hemi-technique with R hemibody.   Measurements of 20x18 w/c: -From rim-to-rim 28 inches -From 1 rim to wheel 26 inches -From wheel to wheel 25 inches *provided to wife  Pt returned to room at end of session where he remained in standard w/c with safety belt alarm on and wife at bedside. All needs in reach.   Therapy Documentation Precautions:  Precautions Precautions: Fall Precaution Comments: Lt hemi with right gaze preference Restrictions Weight Bearing Restrictions: No General:     Therapy/Group: Individual Therapy  Orrin Brigham 07/06/2020, 7:36 AM

## 2020-07-06 NOTE — Patient Care Conference (Signed)
Inpatient RehabilitationTeam Conference and Plan of Care Update Date: 07/06/2020   Time: 13:12 PM    Patient Name: [name]      Medical Record Number: [id number]  Date of Birth: 1966-01-12 Sex: Male         Room/Bed: 4W18C/4W18C-01 Payor Info: Payor: [employer] / Plan: [employer] NAP / Product Type: *No Product type* /    Admit Date/Time:  06/22/2020  2:56 PM  Primary Diagnosis:  Thalamic hemorrhage [facility])  Hospital Problems: Principal Problem:   Thalamic hemorrhage (HCC) Active Problems:   Acute blood loss anemia   AKI (acute kidney injury) (HCC)   Essential hypertension   Seizures (HCC)   Vascular headache   Dysphagia, post-stroke   Left hemiplegia (HCC)   Cognitive deficit due to recent stroke    Expected Discharge Date: Expected Discharge Date: 07/21/20  Team Members Present: Physician leading conference: Dr. Sula Soda Care Coodinator Present: Dossie Der, LCSW;Kenslie Abbruzzese Lambert Mody, RN, BSN, CRRN Nurse Present: Chana Bode, RN PT Present: Wynelle Link, PT OT Present: Dolphus Jenny, OT SLP Present: Colin Benton, SLP PPS Coordinator present : Edson Snowball, PT     Current Status/Progress Goal Weekly Team Focus  Bowel/Bladder   Patient uses urinal with occasional spills. incontinent of bowels, LBM 6/12  Patient will gain continence of both B&B  Scheduled toileting   Swallow/Nutrition/ Hydration   dys 3 textures and thin liquids,  Mod I  regular texture trial tray on 6/15   ADL's   Pt has made some excellent progress with mobility, now sit to stand and squat pivots min A with MOD cues.  UB self care min, LB self care mod A.  ankle pain has not been an issue recently. developing LUE motor movement.  min assist  LUE NMR, functional mobility, balance, L side awareness, ADL training, pt/family education   Mobility   modA bed mobility, minA sit<>stand in [place]. ModA sit<>stand wtih no AD - needs L knee block. Has ambulated 74ft with +2 maxA using R hand rail. Continues  to be in TIS w/c  minA  bed mobility training, standing balance, functional transfers, pre-gait and functional gait training in // bars and using Hand rail. Safety awareness. Pt education. Family education.   Communication             Safety/Cognition/ Behavioral Observations  Min A  Supervision A  selective/alternating attention, mildly complex problem solving, error awareness and recall   Pain   Patient complains of pain on left shoulder and left groin, right ankle soreness still evident  Pain relief  Frequent assessment of pain and treatment prn, maintain brace and splints as ordered   Skin   no skin issues  Maintain skin integrity  keep skin clean and dry from incontinence, frequent turning     Discharge Planning:  Wife needs to come in for education due to somewhat unrealistic-regarding pt's care needs for discharge. FMLA papers completed for her and given back   Team Discussion: Left shoulder pain addressed. Right ankle brace and icing helps. MD adjust topamax for HA. Overall doing well medically per MD. MD added lidocaine patch for tendonitis.  Patient on target to meet rehab goals: yes, currently min assist for squat pivot transfers with mod cues. Dressing with min assist upper body and mod assist for lower body care. Trace movement return in Left UE/shoulder using kinesio tape for subluxation and recommend a sling for mobility.  Mod assist for bed mobility and sit - stand due to knee buckling.  Min - mod assist for squat pivot transfers.   *See Care Plan and progress notes for long and short-term goals.   Revisions to Treatment Plan:  Working on hand rail transfers, use of parallel bars and eva walker   Teaching Needs: Transfers, toileting, medication, secondary stroke risk management, etc.   Current Barriers to Discharge: Decreased caregiver support and Home enviroment access/layout  Possible Resolutions to Barriers: Family education     Medical Summary Current Status:  low back pain, significant left shoulder pain secondary to subluxation and bicipital teninosis, AKI has resolved, continued headaches  Barriers to Discharge: Medical stability  Barriers to Discharge Comments: continued headaches, obesity, left shoulder pain, right ankle pain Possible Resolutions to [employer] Focus: increase topamax to 50mg , XR left shoulder obtained and shows mild subluxation, continue bracing and icing for right shoulder pain, discontinue capsaicin   Continued Need for Acute Rehabilitation Level of Care: The patient requires daily medical management by a physician with specialized training in physical medicine and rehabilitation for the following reasons: Direction of a multidisciplinary physical rehabilitation program to maximize functional independence : Yes Medical management of patient stability for increased activity during participation in an intensive rehabilitation regime.: Yes Analysis of laboratory values and/or radiology reports with any subsequent need for medication adjustment and/or medical intervention. : Yes   I attest that I was present, lead the team conference, and concur with the assessment and plan of the team.   B 07/06/2020, 3:09 PM

## 2020-07-07 NOTE — Plan of Care (Signed)
  Problem: RH Problem Solving Goal: LTG Patient will demonstrate problem solving for (SLP) Description: LTG:  Patient will demonstrate problem solving for basic/complex daily situations with cues  (SLP) Flowsheets Taken 07/07/2020 1555 by Colin Benton, CCC-SLP LTG: Patient will demonstrate problem solving for (SLP): (mildly complex) Other (comment) Taken 06/23/2020 1612 by Huston Foley, CCC-SLP LTG Patient will demonstrate problem solving for: Supervision   Problem: RH Memory Goal: LTG Patient will use memory compensatory aids to (SLP) Description: LTG:  Patient will use memory compensatory aids to recall biographical/new, daily complex information with cues (SLP) Flowsheets (Taken 07/07/2020 1555) LTG: Patient will use memory compensatory aids to (SLP): Supervision

## 2020-07-07 NOTE — Progress Notes (Signed)
Occupational Therapy Session Note  Patient Details  Name: [name] MRN: [id number] Date of Birth: 1965-01-31  Today's Date: 07/07/2020 OT Individual Time: 1400-1430 OT Individual Time Calculation (min): 30 min    Short Term Goals: Week 2:  OT Short Term Goal 1 (Week 2): Pt will complete transfers to toilet with squat pivot of mod A. OT Short Term Goal 2 (Week 2): Pt will be able rise to stand with mod A of 1. OT Short Term Goal 3 (Week 2): Pt will be able to don pants over his feet with min A.  Skilled Therapeutic Interventions/Progress Updates:    Pt sitting up in w/c with visibly wet floor underneath.  Pt reports he pressed the call bell 3 times but no one came to assist when requesting to use urinal and so he had incontinence episode of urine.  Total dependence to clean floor and ensure area dry.  Pt complete sit<>stand with CGA and mod manual support at LUE to reach for bar at stedy and then transported to bathroom to complete skin hygiene/pericare and clothing mgt.  Pt required max assist to doff soiled shorts and total assist to doff brief.  Pt did wash periarea with prepared wet soapy washcloth in standing at stedy with min assist.  Clean brief and shorts donned with max assist.  Pt requested back to bed at end of session.  Stand to sit from stedy to EOB with CGA.  Min assist sit to supine using RLE to lift LLE compensatory strategy implemented.  Call bell in reach, LUE supported with pillow under elbow/forearm, bed alarm on at end of session.    Therapy Documentation Precautions:  Precautions Precautions: Fall Precaution Comments: Lt hemi with right gaze preference Restrictions Weight Bearing Restrictions: No    Therapy/Group: Individual Therapy  Amie Critchley 07/07/2020, 3:24 PM

## 2020-07-07 NOTE — Progress Notes (Signed)
[place] PHYSICAL MEDICINE & REHABILITATION PROGRESS NOTE  Subjective/Complaints: Patient's chart reviewed- No issues reported overnight Vitals signs stable  Sleepy this morning.  Extended stay 1 week given his progress  ROS:   Pt denies SOB, abd pain, CP, N/V/C/D, and vision changes, +left shoulder pain, right ankle pain has resolved   Objective: Vital Signs: Blood pressure 129/81, pulse 77, temperature 98.2 F (36.8 C), temperature source Oral, resp. rate 20, height 5\' 10"  (1.778 m), weight 105.4 kg, SpO2 99 %. DG Shoulder 1V Left  Result Date: 07/05/2020 CLINICAL DATA:  Left shoulder pain EXAM: LEFT SHOULDER COMPARISON:  None. FINDINGS: Degenerative changes of the acromioclavicular joint are noted. The humeral head is slightly subluxed inferiorly although not truly dislocated. Soft tissues fall. Left chest wall is unremarkable as well. IMPRESSION: No acute bony abnormality is noted. Mild subluxation of the humeral head is seen. Electronically Signed   By: 07/07/2020 M.D.   On: 07/05/2020 16:02   Recent Labs    07/05/20 0535  WBC 5.2  HGB 11.4*  HCT 34.3*  PLT 329    Recent Labs    07/05/20 0535  NA 140  K 3.9  CL 106  CO2 24  GLUCOSE 94  BUN 11  CREATININE 1.15  CALCIUM 9.6     Intake/Output Summary (Last 24 hours) at 07/07/2020 1133 Last data filed at 07/07/2020 0745 Gross per 24 hour  Intake 678 ml  Output 850 ml  Net -172 ml        Physical Exam: BP 129/81 (BP Location: Right Arm)   Pulse 77   Temp 98.2 F (36.8 C) (Oral)   Resp 20   Ht 5\' 10"  (1.778 m)   Wt 105.4 kg   SpO2 99%   BMI 33.34 kg/m   Gen: no distress, normal appearing HEENT: oral mucosa pink and moist, NCAT Cardio: Reg rate Chest: normal effort, normal rate of breathing Abd: soft, non-distended Ext: no edema Psych: pleasant, normal affect Skin: intact Neuro: Cerebellar exam normal finger to nose to finger as well as heel to shin in bilateral upper and lower  extremities Musculoskeletal: Full range of motion in all 4 extremities. No joint swelling- L shoulder and L hand have Ktaping on them- Main TTP anterior shoulder c/w biceps tendon pain. Left shoulder with mild subluxation  Neuro:  Dysarthria, stable Motor: LUE: 0/5 proximal to distal LLE: HF, KE 2-/5, ADF 1/5, unchanged Left inattention RUE/RLE: 5/5 proximal distal Sensation intact LT on RIght , absent on left    Assessment/Plan: 1. Functional deficits which require 3+ hours per day of interdisciplinary therapy in a comprehensive inpatient rehab setting. Physiatrist is providing close team supervision and 24 hour management of active medical problems listed below. Physiatrist and rehab team continue to assess barriers to discharge/monitor patient progress toward functional and medical goals   Care Tool:  Bathing    Body parts bathed by patient: Chest, Abdomen, Left arm, Face, Front perineal area, Right lower leg, Right upper leg, Left upper leg, Left lower leg, Right arm (used long sponge)   Body parts bathed by helper: Right arm, Buttocks Body parts n/a: Right arm, Buttocks, Left lower leg   Bathing assist Assist Level: Minimal Assistance - Patient > 75%     Upper Body Dressing/Undressing Upper body dressing   What is the patient wearing?: Pull over shirt    Upper body assist Assist Level: Minimal Assistance - Patient > 75%    Lower Body Dressing/Undressing Lower body dressing  What is the patient wearing?: Incontinence brief, Pants     Lower body assist Assist for lower body dressing: Moderate Assistance - Patient 50 - 74%     Toileting Toileting    Toileting assist Assist for toileting: Moderate Assistance - Patient 50 - 74% (with use of stedy)     Transfers Chair/bed transfer  Transfers assist     Chair/bed transfer assist level: Minimal Assistance - Patient > 75%     Locomotion Ambulation   Ambulation assist   Ambulation activity did not  occur: Safety/medical concerns          Walk 10 feet activity   Assist  Walk 10 feet activity did not occur: Safety/medical concerns        Walk 50 feet activity   Assist Walk 50 feet with 2 turns activity did not occur: Safety/medical concerns         Walk 150 feet activity   Assist Walk 150 feet activity did not occur: Safety/medical concerns         Walk 10 feet on uneven surface  activity   Assist Walk 10 feet on uneven surfaces activity did not occur: Safety/medical concerns         Wheelchair     Assist Will patient use wheelchair at discharge?:  (TBD)             Wheelchair 50 feet with 2 turns activity    Assist            Wheelchair 150 feet activity     Assist           Medical Problem List and Plan: 1.  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 Course complicated by seizure  Continue PT, OT and SLP  WHO/PRAFO nightly 2.  Impaired mobility -DVT/anticoagulation:  Pharmaceutical: Continue Lovenox             -antiplatelet therapy: N/A due to bleed 3.  Ongoing headaches/pain Management: Continue Depakote and Keppra.  Increase Topamax to 50mg  4. Mood: LCSW to follow for evaluation and support             -antipsychotic agents: N/AA 5. Neuropsych: This patient is intermittently capable of making decisions on his own behalf. 6. Skin/Wound Care: Routine pressure-relief measures. Eucerin ordered for dry skin. Recommended wife bring coconut oil from home.  7. Fluids/Electrolytes/Nutrition: Monitor intake/output. Discussed anti-inflammatory diet, replacing ginger ale with water during the day and tart cherry juice at night, cranberry juice if he really wants juice during the day. 9.  Partial complex seizure: Continue Keppra 500 mg twice daily. 10.  HTN: Monitor blood pressures 3 times daily.              -- Labile, continue amlodipine, labetalol,  losartan and clonidine             -SBP goal of <160 given ICH  6/11-6/14- BP controlled- continue regimen Vitals:   07/06/20 2034 07/07/20 0442  BP: 132/83 129/81  Pulse: 82 77  Resp: 18 20  Temp: 98.5 F (36.9 C) 98.2 F (36.8 C)  SpO2: 98% 99%   11.  Dyslipidemia: On statin. 12.  Acute kidney injury:   Resolved  13.  History of alcohol abuse/delirium: Continue Seroquel 25 mg at bedtime to help with sleep/wake cycle. Discussed this would not be long term             -- Continue folic acid and thiamine supplementation 14.  Acute blood loss anemia:   Hemoglobin 11.8 on 6/1, stable at 11.4  Continue to monitor 15.  Morbid obesity/OHS/OSA?: Outpatient sleep study recommended for work-up 16.  Post stroke dysphagia  D3 thins, advance diet as tolerated  6/11- tolerating diet- no issues- con't regimen 17. Right ankle pain: suspect sprain given tenderness over ATFL. Ice 15mg  TID. Given chronicity of pain and pain with weightbearing, ordered right ankle XR- discussed with wife that there is no evidence of fracture or athropathy. Continue voltaren gel, d/c capsaicin. Requested RN soak foot in epsom salt if available. Ankle support brace ordered and patient wearing. Advised to elevate when resting, minimize weightbearing while with therapy to allow ligament to heal. Provided list of anti-inflammatory foods for pain. Discussed risks and benefits of various medications such as tylenol, NSAIDs, and prednisone and patient and wife decided on above conservative treatment and tylenol for now.   18.  Left shoulder pain exam is neg has hLeft hand swelling but no pain - at this time does not appear to be shoulder hand syndrome , avoid sleeping on left , Retyrograde massage to Left hand , may consider removing wrist splint during the day, discussed with team 19.  Urinary urgency but unable to void, suspect spastic bladder with BPH , start flomax, BP should support this med , as discussed with RN would check  bladder scan as well  20. Left shoulder pain: continue lidocaine and voltaren gel. Advised wife to bring in blue emu oil at home. Discussed XR results: mild subluxation  21. Confusion in morning as per wife: d/c Gabapentin.  22. Disposition: HFU scheduled with me on 09/17/20 1:00PM.   LOS: 15 days A FACE TO FACE EVALUATION WAS PERFORMED  09/19/20 P Moranda Billiot 07/07/2020, 11:33 AM

## 2020-07-07 NOTE — Progress Notes (Signed)
Occupational Therapy Session Note  Patient Details  Name: [name] MRN: [id number] Date of Birth: 11-26-1965  Today's Date: 07/07/2020 OT Individual Time: 0930-1030 OT Individual Time Calculation (min): 60 min    Short Term Goals: Week 1:  OT Short Term Goal 1 (Week 1): Pt will be able to complete stedy transfer to toilet with min A of 1. OT Short Term Goal 1 - Progress (Week 1): Met OT Short Term Goal 2 (Week 1): Pt will be able to stand in stedy with min A while using R hand to adjust clothing over hips with min A. OT Short Term Goal 2 - Progress (Week 1): Met OT Short Term Goal 3 (Week 1): Pt will don shirt with min A. OT Short Term Goal 3 - Progress (Week 1): Met OT Short Term Goal 4 (Week 1): Pt will be able to self mobilize LUE with min A. OT Short Term Goal 4 - Progress (Week 1): Met OT Short Term Goal 5 (Week 1): Pt will be able to self feed with supervision. OT Short Term Goal 5 - Progress (Week 1): Met Week 2:  OT Short Term Goal 1 (Week 2): Pt will complete transfers to toilet with squat pivot of mod A. OT Short Term Goal 2 (Week 2): Pt will be able rise to stand with mod A of 1. OT Short Term Goal 3 (Week 2): Pt will be able to don pants over his feet with min A.     Skilled Therapeutic Interventions/Progress Updates:    Pt received in bed and agreeable to therapy.  Had pt "freshen up" his perineal area and then assisted pt with changing briefs and donning shorts using bridging to facilitate LLE. C/o back pain, worked on gentle bent knee sways to loosen his back and gentle LUE PROM for his shoulder. Positioned pt in "sunbather stretch" for ext rotation and pt stated this felt good to him.   Worked on bed mobility of rolling to R with improved active rolling by actively bringing L hand onto torso with mod A.  Rolled to side with min and then pushed up to sit with min.  Sat for a minute to let dizziness pass.  Squat pivot in 2x to move to standard w/c with only min A to  support arm and leg into neutral. Had pt practice directing his own care by verbally instructing the therapist in how to support him. Pt had excellent recall in technique. Taken to day room to practice transfer to his R to mat and than back to L to wc with min A.  (Support of L arm and L knee in neutral).   On mat, worked on [employer] with A/arom of arm with towel slides on table with pt demonstrating increased elbow flexion and wrist ext to 20 degrees.  Only has 3 seconds of isometric hold strength with wrist ext.  Used dowel bar with B hands to facilitate sh flex/ext with A/arom.  Pt with improving grasp.  Pt always demonstrates excellent motivation/ participation and is developing a much better sense of his safety limitations with his balance. Pt returned to room in wc, arm on lap tray, belt alarm on and all needs met.    Therapy Documentation Precautions:  Precautions Precautions: Fall Precaution Comments: Lt hemi with right gaze preference Restrictions Weight Bearing Restrictions: No Pain:  C/o L shoulder pain, RN aware ADL: ADL Eating: Set up Grooming: Setup Where Assessed-Grooming: Sitting at sink Upper Body Bathing: Supervision/safety Where  Assessed-Upper Body Bathing: Shower Lower Body Bathing: Minimal assistance Where Assessed-Lower Body Bathing: Shower Upper Body Dressing: Minimal assistance Where Assessed-Upper Body Dressing: Sitting at sink Lower Body Dressing: Moderate assistance Where Assessed-Lower Body Dressing: Wheelchair Toileting: Moderate assistance Where Assessed-Toileting: Bedside Commode, Toilet Toilet Transfer: Minimal assistance Toilet Transfer Method: Squat pivot Toilet Transfer Equipment: Grab bars, Raised toilet seat Social research officer, government: Moderate assistance Social research officer, government Method: Education officer, environmental: Radio broadcast assistant, Grab bars   Therapy/Group: Individual Therapy  [place] 07/07/2020, 12:46 PM

## 2020-07-07 NOTE — Progress Notes (Signed)
Speech Language Pathology Weekly Progress and Session Note  Patient Details  Name: [name] MRN: [id number] Date of Birth: Apr 17, 1965  Beginning of progress report period: July 01, 2020 End of progress report period: July 07, 2020  Today's Date: 07/07/2020 SLP Individual Time: 1230-1300 SLP Individual Time Calculation (min): 30 min  Short Term Goals: Week 2: SLP Short Term Goal 1 (Week 2): Patient will consume current diet with minimal overt s/s of aspiration with mod I swallowing compensatory strategies. SLP Short Term Goal 1 - Progress (Week 2): Met SLP Short Term Goal 2 (Week 2): Patient will demonstrate efficient mastication with minimal oral residue without overt s/s of aspiration with supervision level verbal cues over 2 sessions prior to upgrade. SLP Short Term Goal 2 - Progress (Week 2): Met SLP Short Term Goal 3 (Week 2): Patient will recall new, daily information with Min verbal and visual cues SLP Short Term Goal 3 - Progress (Week 2): Met SLP Short Term Goal 4 (Week 2): Patient will demonstrate sustained attention to functional tasks for 15-20 minutes wit supervision verbal cues for redirection. SLP Short Term Goal 4 - Progress (Week 2): Met SLP Short Term Goal 5 (Week 2): Patient will demonstrate functional problem solving for basic and familiar tasks with Min verbal cues. SLP Short Term Goal 5 - Progress (Week 2): Met SLP Short Term Goal 6 (Week 2): Pt will demonstrate self-monitoring and self-correction of functional errors in problem solving tasks with mod A verbal cues. SLP Short Term Goal 6 - Progress (Week 2): Met    New Short Term Goals: Week 3: SLP Short Term Goal 1 (Week 3): Patient will consume current diet with minimal overt s/s of aspiration with mod I swallowing compensatory strategies. SLP Short Term Goal 2 (Week 3): Patient will recall new, daily information with supervision verbal and visual cues SLP Short Term Goal 3 (Week 3): Pt will demonstrate  selective attention for 30-45 minutes in mildly distracting environments with supervision A verbal cues. SLP Short Term Goal 4 (Week 3): Pt will demonstrate mildly complex problem solving skills in functional tasks with min A verbal cues. SLP Short Term Goal 5 (Week 3): Pt will demonstrate self-monitoring and self-correction of errors in problem solving tasks with min A verbal cues.  Weekly Progress Updates: Pt made great progress meeting 6 out 6 goals. Pt demonstrated improvement in oral function and carryover of swallow strategies allowing diet upgrade of regular textures. Pt is able to complete basic problem solving tasks and some mildly complex problem solving task with increase awareness in errors and abliity to scan left in table top tasks. Pt demonstrated sustained attention and recall of recent events/strategies. LTG were upgrade due to pt's progress. Pt would continue to benefit from skilled ST services in order to maximize functional independence and reduce burden of care, likely requiring supervision at discharge with continued skilled ST services.      Intensity: Minumum of 1-2 x/day, 30 to 90 minutes Frequency: 3 to 5 out of 7 days Duration/Length of Stay: 6/29 Treatment/Interventions: Cognitive remediation/compensation;Internal/external aids;Dysphagia/aspiration precaution training;Therapeutic Activities;Environmental controls;Cueing hierarchy;Functional tasks;Patient/family education   Daily Session  Skilled Therapeutic Interventions: Skilled ST services focused on swallow skills. SLP facilitated PO consumption of regular textures trial lunch tray. Pt demonstrated appropriate mastication, oral clearance with occasional supervision A verbal cues to clear mild left buccal residue, swallow appeared timely and no overt s/s aspiration. SLP recommends upgrading diet to regular textures and thin liquids with intermittent supervision, with x1 follow  up for tolerance of diet piror to dysphagia  goal discharge. Pt was left in room with call bell within reach and chair alarm set. SLP recommends to continue skilled services.   General    Pain Pain Assessment Pain Score: 0-No pain  Therapy/Group: Individual Therapy  Grady Lucci  [facility] 07/07/2020, 3:55 PM

## 2020-07-07 NOTE — Progress Notes (Signed)
Physical Therapy Weekly Progress Note  Patient Details  Name: [name] MRN: [id number] Date of Birth: 30-Jul-1965  Beginning of progress report period: June 30, 2020 End of progress report period: July 07, 2020  Today's Date: 07/07/2020 PT Individual Time: 8546-2703 PT Individual Time Calculation (min): 55 min   Patient has met 3 of 4 short term goals.  Pt is making appropriate progress towards goals. He is able to complete bed mobility with modA, bed<>chair transfers with modA, and can ambulate ~15ft with +3 maxA. He has transitioned from TIS w/c to standard 20x18 w/c and will begin instruction on hemi technique for w/c propulsion. We have also began family education/training with his wife and will continue to do so.  Patient continues to demonstrate the following deficits muscle weakness, decreased cardiorespiratoy endurance, impaired timing and sequencing, unbalanced muscle activation, motor apraxia, decreased coordination, and decreased motor planning, decreased midline orientation, decreased attention to left, and decreased motor planning, decreased attention, decreased awareness, decreased problem solving, decreased safety awareness, and delayed processing, and decreased sitting balance, decreased standing balance, decreased postural control, hemiplegia, and decreased balance strategies and therefore will continue to benefit from skilled PT intervention to increase functional independence with mobility.  Patient progressing toward long term goals..  Continue plan of care.  PT Short Term Goals Week 2:  PT Short Term Goal 1 (Week 2): Pt will complete bed mobility with modA consistently PT Short Term Goal 1 - Progress (Week 2): Met PT Short Term Goal 2 (Week 2): Pt will complete sit<>stand transfers with modA without the use of Stedy PT Short Term Goal 2 - Progress (Week 2): Met PT Short Term Goal 3 (Week 2): Pt will complete bed<>chair transfers with maxA and LRAD PT Short Term Goal 3  - Progress (Week 2): Met PT Short Term Goal 4 (Week 2): Pt will ambulate 34ft with modA +2 with LRAD PT Short Term Goal 4 - Progress (Week 2): Not met Week 3:  PT Short Term Goal 1 (Week 3): Pt will complete bed mobility with no more than minA consistently PT Short Term Goal 2 (Week 3): Pt will complete sit<>stand transfers with no more than minA consistently with LRAD PT Short Term Goal 3 (Week 3): Pt will complete bed<>chair transfers with minA and LRAD PT Short Term Goal 4 (Week 3): Pt will ambulate 47ft with +2 modA PT Short Term Goal 5 (Week 3): Pt will propel himself 59ft with modA on level surfaces via hemi-technique  Skilled Therapeutic Interventions/Progress Updates:    Pt greeted supine in bed to start PT tx - agreeable to therapy. No reports of pain but reports urinary urgency incontinence earlier before PT arrival for which OT assisted with cleaning up. His standard w/c was missing in his room and he reports that OT took his w/c for cleaning. PT retrieved a new 20x18 w/c with 1/2 lap tray for LUE. Completed supine<>sit requiring modA for trunk elevation. Completed Stedy transfer with minA to stand using RUE to pull up to standing. In w/c, worked on hemi technique for w/c propulsion with R hemibody. Pt needing assistance to forward reciprocal scoot in w/c to allow RLE to reach the floor. He required modA for propelling himself ~12ft in hallways. Next, worked on [employer] while seated in w/c with BITS system - completed trail making, maze test, and geoboard duplication tasks. He was able to complete trail making with 100% accuracy, maze test with 100% accuracy, and mild complexity puzzles with 75% accuracy. Pt returned  to room where he remained seated in w/c with safety belt alarm on and all needs in reach, 1/2 lap tray supporting LUE.    Therapy Documentation Precautions:  Precautions Precautions: Fall Precaution Comments: Lt hemi with right gaze preference Restrictions Weight Bearing  Restrictions: No General:    Therapy/Group: Individual Therapy  Mckennon Zwart P Jordon Bourquin 07/07/2020, 7:45 AM

## 2020-07-08 MED ORDER — LIDOCAINE 5 % EX PTCH
1.0000 | MEDICATED_PATCH | CUTANEOUS | Status: DC
  Administered 2020-07-08 – 2020-07-21 (×15): 1 via TRANSDERMAL
  Filled 2020-07-08 (×14): qty 1

## 2020-07-08 NOTE — Progress Notes (Signed)
Speech Language Pathology Daily Session Note  Patient Details  Name: Latham Kinzler MRN: [id number] Date of Birth: 26-Feb-1965  Today's Date: 07/08/2020 SLP Individual Time: 1300-1345 SLP Individual Time Calculation (min): 45 min  Short Term Goals: Week 3: SLP Short Term Goal 1 (Week 3): Patient will consume current diet with minimal overt s/s of aspiration with mod I swallowing compensatory strategies. SLP Short Term Goal 2 (Week 3): Patient will recall new, daily information with supervision verbal and visual cues SLP Short Term Goal 3 (Week 3): Pt will demonstrate selective attention for 30-45 minutes in mildly distracting environments with supervision A verbal cues. SLP Short Term Goal 4 (Week 3): Pt will demonstrate mildly complex problem solving skills in functional tasks with min A verbal cues. SLP Short Term Goal 5 (Week 3): Pt will demonstrate self-monitoring and self-correction of errors in problem solving tasks with min A verbal cues.  Skilled Therapeutic Interventions: Skilled SLP intervention focused on cognition. Pt completed pill sorting task with TID pill organizer. He was able to recall medications and purpose with min A verbal cues. He demonstrated understanding of placement for pills with supervision A. Pt completed time calculations with rehab schedule with supervision A visual cues. Cont with therapy per plan of care.      Pain Pain Assessment Pain Scale: Faces Faces Pain Scale: No hurt  Therapy/Group: Individual Therapy  Carlean Jews Krishawna Stiefel 07/08/2020, 1:45 PM

## 2020-07-08 NOTE — [place] FL2 (Addendum)
[place] MEDICAID FL2 LEVEL OF CARE SCREENING TOOL     IDENTIFICATION  Patient Name: [name] Birthdate: [birth year] Sex: male Admission Date (Current Location): 06/22/2020  [place] and [place] Number:  Haynes Bast  [id number] Facility and Address:  [facility], [address] [place], [place] [address]      Provider Number: [id number]  Attending Physician Name and Address:  Horton Chin, MD  Relative Name and Phone Number:  Cheryl-wife [phone]    Current Level of Care: Other (Comment) (Rehab) Recommended Level of Care: Skilled Nursing Facility Prior Approval Number:    Date Approved/Denied:   PASRR Number:    Discharge Plan: Home    Current Diagnoses: Patient Active Problem List   Diagnosis Date Noted   Left hemiplegia (HCC) 07/02/2020   Cognitive deficit due to recent stroke 07/02/2020   Dysphagia, post-stroke    Vascular headache    Acute blood loss anemia    AKI (acute kidney injury) (HCC)    Essential hypertension    Seizures (HCC)    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   ICH (intracerebral hemorrhage) (HCC) 06/09/2020   Sprain of anterior talofibular ligament of left ankle 01/15/2020   Hyperlipidemia, on Lipitor 04/01/2017   Alcohol use disorder, mild, abuse 06/30/2016   Morbid obesity (HCC) 06/30/2016   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   Chronic combined systolic and diastolic CHF (congestive heart failure) (HCC) 11/08/2012   HTN (hypertension) 05/25/2012    Orientation RESPIRATION BLADDER Height & Weight     Self, Time, Situation, Place  Normal Continent Weight: 232 lb 5.8 oz (105.4 kg) Height:  5\' 10"  (177.8 cm)  BEHAVIORAL SYMPTOMS/MOOD NEUROLOGICAL BOWEL NUTRITION STATUS      Incontinent Diet (Dys 3 thin liquids)  AMBULATORY STATUS COMMUNICATION OF NEEDS Skin   Extensive Assist Verbally Normal                       Personal Care Assistance Level of Assistance  Bathing,  Dressing Bathing Assistance: Limited assistance   Dressing Assistance: Limited assistance     Functional Limitations Info             SPECIAL CARE FACTORS FREQUENCY  PT (By licensed PT), OT (By licensed OT), Speech therapy, Bowel and bladder program     PT Frequency: 5x week OT Frequency: 5x week Bowel and Bladder Program Frequency: Timed tolieting and program   Speech Therapy Frequency: 5x week      Contractures Contractures Info: Present    Additional Factors Info  Code Status, Allergies Code Status Info: Full Code Allergies Info: NKDA           Current Medications (07/08/2020):  This is the current hospital active medication list Current Facility-Administered Medications  Medication Dose Route Frequency Provider Last Rate Last Admin   acetaminophen (TYLENOL) tablet 325-650 mg  325-650 mg Oral Q4H PRN 07/10/2020, PA-C   650 mg at 07/07/20 1757   alum & mag hydroxide-simeth (MAALOX/MYLANTA) 200-200-20 MG/5ML suspension 30 mL  30 mL Oral Q4H PRN Love, Pamela S, PA-C       amLODipine (NORVASC) tablet 10 mg  10 mg Oral Daily 10-21-1985, PA-C   10 mg at 07/08/20 0900   cloNIDine (CATAPRES) tablet 0.1 mg  0.1 mg Oral TID Love, Pamela S, PA-C   0.1 mg at 07/08/20 0544   diclofenac Sodium (VOLTAREN) 1 % topical gel 2 g  2 g Topical QID Raulkar, Krutika P,  MD   2 g at 07/08/20 0900   enoxaparin (LOVENOX) injection 40 mg  40 mg Subcutaneous Q24H Charlton Amor, PA-C   40 mg at 07/07/20 1520   folic acid (FOLVITE) tablet 1 mg  1 mg Oral Daily Charlton Amor, PA-C   1 mg at 07/08/20 6222   hydrocerin (EUCERIN) cream   Topical BID Horton Chin, MD   Given at 07/08/20 0900   labetalol (NORMODYNE) tablet 100 mg  100 mg Oral BID Jacquelynn Cree, PA-C   100 mg at 07/08/20 0859   levETIRAcetam (KEPPRA) tablet 500 mg  500 mg Oral BID Jacquelynn Cree, PA-C   500 mg at 07/08/20 0859   lidocaine (LIDODERM) 5 % 1 patch  1 patch Transdermal Q24H Raulkar, Drema Pry,  MD   1 patch at 07/08/20 0903   lidocaine (LIDODERM) 5 % 2 patch  2 patch Transdermal Q24H Lovorn, Megan, MD   2 patch at 07/07/20 1757   lidocaine (XYLOCAINE) 2 % jelly   Topical PRN Love, Pamela S, PA-C       losartan (COZAAR) tablet 50 mg  50 mg Oral BID Love, Pamela S, PA-C   50 mg at 07/08/20 9798   multivitamin with minerals tablet 1 tablet  1 tablet Oral Daily Charlton Amor, PA-C   1 tablet at 07/08/20 0859   naphazoline-pheniramine (NAPHCON-A) 0.025-0.3 % ophthalmic solution 1 drop  1 drop Right Eye QID PRN Horton Chin, MD   1 drop at 07/06/20 1157   ondansetron (ZOFRAN) tablet 2 mg  2 mg Oral Q8H PRN Marcello Fennel, MD   2 mg at 06/23/20 1036   pantoprazole (PROTONIX) EC tablet 40 mg  40 mg Oral Daily Charlton Amor, PA-C   40 mg at 07/08/20 0859   polyvinyl alcohol (LIQUIFILM TEARS) 1.4 % ophthalmic solution 1 drop  1 drop Both Eyes Q6H PRN Love, Pamela S, PA-C       QUEtiapine (SEROQUEL) tablet 25 mg  25 mg Oral QPC supper Love, Pamela S, PA-C   25 mg at 07/07/20 2027   senna-docusate (Senokot-S) tablet 2 tablet  2 tablet Oral Q supper Jacquelynn Cree, PA-C   2 tablet at 07/07/20 1757   sodium phosphate (FLEET) 7-19 GM/118ML enema 1 enema  1 enema Rectal Daily PRN Love, Pamela S, PA-C       tamsulosin (FLOMAX) capsule 0.4 mg  0.4 mg Oral QPC supper Erick Colace, MD   0.4 mg at 07/07/20 1757   thiamine (B-1) injection 100 mg  100 mg Intravenous Daily Angiulli, Mcarthur Rossetti, PA-C       Or   thiamine tablet 100 mg  100 mg Oral Daily Charlton Amor, PA-C   100 mg at 07/08/20 0859   topiramate (TOPAMAX) tablet 50 mg  50 mg Oral Daily Raulkar, Drema Pry, MD   50 mg at 07/08/20 0900   traZODone (DESYREL) tablet 50 mg  50 mg Oral QHS PRN Jacquelynn Cree, PA-C   50 mg at 06/30/20 2107     Discharge Medications: Please see discharge summary for a list of discharge medications.  Relevant Imaging Results:  Relevant Lab Results:   Additional Information SSN:  [id number] COVID vaccinated  Lucy Chris, LCSW

## 2020-07-08 NOTE — Progress Notes (Signed)
Occupational Therapy Session Note  Patient Details  Name: [name] MRN: [id number] Date of Birth: 06-23-1965  Today's Date: 07/08/2020 OT Individual Time: 1638-4665 OT Individual Time Calculation (min): 60 min    Short Term Goals: Week 2:  OT Short Term Goal 1 (Week 2): Pt will complete transfers to toilet with squat pivot of mod A. OT Short Term Goal 2 (Week 2): Pt will be able rise to stand with mod A of 1. OT Short Term Goal 3 (Week 2): Pt will be able to don pants over his feet with min A.    Skilled Therapeutic Interventions/Progress Updates:    Pt greeted at time of session sitting up in wheelchair with wife present for family education session. Mild low back pain later in session with mobility and STM for R low back with improved symptoms. Wife transporting pt to/from room <> gym via wheelchair for practice maneuvering wheelchair. Focus of session on squat pivot transfers with therapist performing initially. First squat pivot wheelchair > mat pt attempting to stand instead of squat pivot as discussed w/ LOB and Mod A for recover and repositioning in chair. Remainder of squat pivot transfers to R side (strong side) with Min/Mod A and back to chair toward L side (hemi side) with Mod A consistently. Pt's wife performing 4-5 transfers during this time both trialing standard squat pivot with hands on hips with gait belt and bobath method over back, liking standard method best. Cues for pt for anterior weight shift, foot placement, pacing, sequencing, and safety throughout. Cues and education for wife on body mechanics for deeper squat position, posturing B knees around pt's L knee for support, and how to cue pt. Pt up in wheelchair in room with alarm on call bell in reach for next session.   Therapy Documentation Precautions:  Precautions Precautions: Fall Precaution Comments: Lt hemi with right gaze preference Restrictions Weight Bearing Restrictions: No     Therapy/Group:  Individual Therapy  Erasmo Score 07/08/2020, 4:43 PM

## 2020-07-08 NOTE — Progress Notes (Signed)
[place] PHYSICAL MEDICINE & REHABILITATION PROGRESS NOTE  Subjective/Complaints: C/o low back pain this morning. He has tried heating pad with some relief. Asked if he would like to try lidocaine patch here as well and he is agreeable, ordered His left shoulder pain is improved.   ROS:   Pt denies SOB, abd pain, CP, N/V/C/D, and vision changes, +left shoulder pain, right ankle pain has resolved, +right sided lower back pain   Objective: Vital Signs: Blood pressure 139/75, pulse 80, temperature 98.1 F (36.7 C), temperature source Oral, resp. rate 18, height 5\' 10"  (1.778 m), weight 105.4 kg, SpO2 98 %. No results found. No results for input(s): WBC, HGB, HCT, PLT in the last 72 hours.   No results for input(s): NA, K, CL, CO2, GLUCOSE, BUN, CREATININE, CALCIUM in the last 72 hours.    Intake/Output Summary (Last 24 hours) at 07/08/2020 1251 Last data filed at 07/08/2020 0824 Gross per 24 hour  Intake 718 ml  Output 300 ml  Net 418 ml        Physical Exam: BP 139/75 (BP Location: Right Arm)   Pulse 80   Temp 98.1 F (36.7 C) (Oral)   Resp 18   Ht 5\' 10"  (1.778 m)   Wt 105.4 kg   SpO2 98%   BMI 33.34 kg/m   Gen: no distress, normal appearing HEENT: oral mucosa pink and moist, NCAT Cardio: Reg rate Chest: normal effort, normal rate of breathing Abd: soft, non-distended Ext: no edema Psych: pleasant, normal affect Skin: intact Neuro: Cerebellar exam normal finger to nose to finger as well as heel to shin in bilateral upper and lower extremities Musculoskeletal: Full range of motion in all 4 extremities. No joint swelling- L shoulder and L hand have Ktaping on them- Main TTP anterior shoulder c/w biceps tendon pain. Left shoulder with mild subluxation Dysarthria, stable Motor: LUE: 0/5 proximal to distal LLE: HF, KE 2-/5, ADF 1/5, unchanged Left inattention RUE/RLE: 5/5 proximal distal Sensation intact LT on RIght , absent on left    Assessment/Plan: 1.  Functional deficits which require 3+ hours per day of interdisciplinary therapy in a comprehensive inpatient rehab setting. Physiatrist is providing close team supervision and 24 hour management of active medical problems listed below. Physiatrist and rehab team continue to assess barriers to discharge/monitor patient progress toward functional and medical goals   Care Tool:  Bathing    Body parts bathed by patient: Chest, Abdomen, Left arm, Face, Front perineal area, Right lower leg, Right upper leg, Left upper leg, Left lower leg, Right arm (used long sponge)   Body parts bathed by helper: Right arm, Buttocks Body parts n/a: Right arm, Buttocks, Left lower leg   Bathing assist Assist Level: Minimal Assistance - Patient > 75%     Upper Body Dressing/Undressing Upper body dressing   What is the patient wearing?: Pull over shirt    Upper body assist Assist Level: Minimal Assistance - Patient > 75%    Lower Body Dressing/Undressing Lower body dressing      What is the patient wearing?: Incontinence brief, Pants     Lower body assist Assist for lower body dressing: Moderate Assistance - Patient 50 - 74%     Toileting Toileting    Toileting assist Assist for toileting: Moderate Assistance - Patient 50 - 74% (with use of stedy)     Transfers Chair/bed transfer  Transfers assist     Chair/bed transfer assist level: Minimal Assistance - Patient > 75%  Locomotion Ambulation   Ambulation assist   Ambulation activity did not occur: Safety/medical concerns          Walk 10 feet activity   Assist  Walk 10 feet activity did not occur: Safety/medical concerns        Walk 50 feet activity   Assist Walk 50 feet with 2 turns activity did not occur: Safety/medical concerns         Walk 150 feet activity   Assist Walk 150 feet activity did not occur: Safety/medical concerns         Walk 10 feet on uneven surface  activity   Assist Walk 10  feet on uneven surfaces activity did not occur: Safety/medical concerns         Wheelchair     Assist Will patient use wheelchair at discharge?:  (TBD)             Wheelchair 50 feet with 2 turns activity    Assist            Wheelchair 150 feet activity     Assist           Medical Problem List and Plan: 1.  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 Course complicated by seizure  Continue PT, OT and SLP  WHO/PRAFO nightly 2.  Impaired mobility -DVT/anticoagulation:  Pharmaceutical: Continue Lovenox             -antiplatelet therapy: N/A due to bleed 3.  Ongoing headaches/pain Management: Continue Depakote and Keppra.  Continue Topamax to 50mg  4. Mood: LCSW to follow for evaluation and support             -antipsychotic agents: N/AA 5. Neuropsych: This patient is intermittently capable of making decisions on his own behalf. 6. Skin/Wound Care: Routine pressure-relief measures. Eucerin ordered for dry skin. Recommended wife bring coconut oil from home.  7. Fluids/Electrolytes/Nutrition: Monitor intake/output. Discussed anti-inflammatory diet, replacing ginger ale with water during the day and tart cherry juice at night, cranberry juice if he really wants juice during the day. 9.  Partial complex seizure: Continue Keppra 500 mg twice daily. 10.  HTN: Monitor blood pressures 3 times daily.              -- Labile, continue amlodipine, labetalol, losartan and clonidine             -SBP goal of <160 given ICH  6/11-6/14- BP controlled- continue regimen Vitals:   07/07/20 1927 07/08/20 0322  BP: (!) 144/76 139/75  Pulse: 81 80  Resp: 18 18  Temp: 97.8 F (36.6 C) 98.1 F (36.7 C)  SpO2: 98% 98%   11.  Dyslipidemia: Continue statin. 12.  Acute kidney injury:   Resolved  13.  History of alcohol abuse/delirium: Continue Seroquel 25 mg at bedtime to help with sleep/wake cycle.  Discussed this would not be long term             -- Continue folic acid and thiamine supplementation 14.  Acute blood loss anemia:   Hemoglobin 11.8 on 6/1, stable at 11.4  Continue to monitor 15.  Morbid obesity/OHS/OSA?: Outpatient sleep study recommended for work-up 16.  Post stroke dysphagia  D3 thins, advance diet as tolerated  6/11- tolerating diet- no issues- con't regimen 17. Right ankle pain: suspect sprain given tenderness over ATFL. Ice 15mg  TID. Given chronicity of pain and pain with weightbearing, ordered right ankle XR- discussed with wife that there is no  evidence of fracture or athropathy. Continue voltaren gel, d/c capsaicin. Requested RN soak foot in epsom salt if available. Ankle support brace ordered and patient wearing. Advised to elevate when resting, minimize weightbearing while with therapy to allow ligament to heal. Provided list of anti-inflammatory foods for pain. Discussed risks and benefits of various medications such as tylenol, NSAIDs, and prednisone and patient and wife decided on above conservative treatment and tylenol for now.    -improved.  18.  Urinary urgency but unable to void, suspect spastic bladder with BPH , start flomax, BP should support this med , as discussed with RN would check bladder scan as well  19. Left shoulder pain: continue lidocaine and voltaren gel. Advised wife to bring in blue emu oil at home. Discussed XR results: mild subluxation  20. Confusion in morning as per wife: d/c Gabapentin. 21. Low back pain, right side: continue heating pad and add lidocaine patch.   22. Disposition: HFU scheduled with me on 09/17/20 1:00PM. FL2 form completed, wife has requested copy as well.    LOS: 16 days A FACE TO FACE EVALUATION WAS PERFORMED  Drema Pry Terre Hanneman 07/08/2020, 12:51 PM

## 2020-07-08 NOTE — Progress Notes (Signed)
Physical Therapy Session Note  Patient Details  Name: [name] MRN: [id number] Date of Birth: [birth year]  Today's Date: 07/08/2020 PT Individual Time: 0263-7858 PT Individual Time Calculation (min): 73 min   Short Term Goals: Week 3:  PT Short Term Goal 1 (Week 3): Pt will complete bed mobility with no more than minA consistently PT Short Term Goal 2 (Week 3): Pt will complete sit<>stand transfers with no more than minA consistently with LRAD PT Short Term Goal 3 (Week 3): Pt will complete bed<>chair transfers with minA and LRAD PT Short Term Goal 4 (Week 3): Pt will ambulate 76ft with +2 modA PT Short Term Goal 5 (Week 3): Pt will propel himself 29ft with modA on level surfaces via hemi-technique  Skilled Therapeutic Interventions/Progress Updates:    Pt received sitting in w/c with his wife, [name], present for family education with hands-on training. Pt agreeable to therapy session. Pt requesting medication administration due to pain in groin region and L shoulder - RN present to administer and no additional reports of pain throughout session. Pt's wife reports 1 STE home and inquires about how to safely assist pt up/down the step in w/c - therapist recommended that she look into having a ramp built or purchase a temporary ramp but educated her on alternate option of bumping pt up/down the step in w/c. Therapist demonstrated how to adjust w/c anti-tippers and demos tilting patient back in order to reinforce the recommendation of a ramp. Pt's wife planning to look into a ramp but may end up having to do w/c bump. Pt's wife inquires about how to perform toilet tranfers - therapist educated that closer to D/C date, once pt's has reached max independence with functional mobility, then the OT will educate her on safest technique.    Transported to/from gym in w/c for time management and energy conservation. Therapist educated pt/wife on the following while providing verbal education and  demonstration:  - how to manage w/c leg rests, brakes, and flipping back armrest - proper positioning and set-up of wheelchair for squat pivot transfers (had pt perform transfers to L towards mat and transfers R back to w/c for safety) - educated pt's wife that pt requires decreased assistance with transfers towards R compared to L - educated pt's wife on proper use of gait belt  - educated on supporting L UE during transfers (requires repeated cuing for this as pt's wife still determining best hand placement to assist pt with transfers) - positioning of her legs to help facilitate pt's L LE into increased internal rotation  Pt performed block practice squat pivot transfers w/c<>EOM (L towards mat, R towards w/c) with therapist providing min assist to R and light mod assist to L on 1st trial and then pt performed 3x with pt's wife providing assistance: - pt's wife initially attempting over-the-back technique but with practice determined best not to assist pt that way as it causes her to have L LOB  - by 3rd set of transfers w/c<>EOM pt's wife able to provide assistance to pt with only close supervision from therapist (and stabilizing w/c)  Pt's wife reports feeling more comfortable/confident but will benefit from continued hands-on training/practice in varying environments (random practice) to ensure understanding.   Pt's wife mentions their bed is ~25inches high, which is too high for pt to complete squat pivot transfers from current w/c height (pt requires lower w/c height in order to allow hemi-propulsion) - therapist discussed option of purchasing alternative mattress support rather than  using tall box spring - pt's wife planning to look into this. Pt also talks about wanting to sit on a couch at home - therapist educated pt/wife that it would be best to practice that with therapist prior to attempting and again to consider height of couch vs height of w/c.  Discussed recommendation for  wheelchair with options of standard off-the shelf chair vs custom light-weight wheelchair pending pt's long term needs and anticipated functional mobility level. Will defer this decision to primary PT; however, of note pt reports buttocks pain from prolonged sitting on current contoured foam cushion. Also, pt's bathroom doorways are ~20-21inches and not w/c accessible.   Pt's wife concerned about assisting pt with standing and toileting - reinforced earlier education on plan for OT to educate/train on this closer to D/C - mentioned that some individuals purchase a stedy but that depending on pt's progress that will not be necessary.   At end of session pt left sitting in w/c with needs in reach, gait belt around LEs to provide low load long duration L hip stretch into IR (educated pt/wife to remove after meal), seat belt alarm on, meal tray set-up, and pt's wife present.     Therapy Documentation Precautions:  Precautions Precautions: Fall Precaution Comments: Lt hemi with right gaze preference Restrictions Weight Bearing Restrictions: No   Pain: Ground and L shoulder pain - details above.   Therapy/Group: Individual Therapy  Ginny Forth , PT, DPT, CSRS 07/08/2020, 12:45 PM

## 2020-07-08 NOTE — Progress Notes (Signed)
Occupational Therapy Session Note  Patient Details  Name: [name] MRN: [id number] Date of Birth: 1965-06-02  Today's Date: 07/08/2020 OT Individual Time: 0930-1005 OT Individual Time Calculation (min): 35 min    Short Term Goals: Week 2:  OT Short Term Goal 1 (Week 2): Pt will complete transfers to toilet with squat pivot of mod A. OT Short Term Goal 2 (Week 2): Pt will be able rise to stand with mod A of 1. OT Short Term Goal 3 (Week 2): Pt will be able to don pants over his feet with min A.  Skilled Therapeutic Interventions/Progress Updates:    Pt received in bed ready for therapy. Worked on squat pivot to his R side from bed to wc with min A, then again to his R side to therapy mat.   From mat worked on sit to stand with min A to support L leg into neutral at knee and LUE and slow eccentric stand to sit in 3 counts with min A 10 x 2 sets.   LUE NMR with A/arom of reaching hand to R shoulder then down to L knee with good improvement in tricep strength.  Increased R shoulder activity.   Squat pivot back to wc with min A to his L but did need cues to keep his wt shift forward as on the last squat he leaned back and landed in w/c too quickly.  Pt participated well. Returned to room and set up in wc with lap tray.  Provided with soft gym ball for pt to practice hip adduction squeezes.  Belt alarm on and all needs met.  Therapy Documentation Precautions:  Precautions Precautions: Fall Precaution Comments: Lt hemi with right gaze preference Restrictions Weight Bearing Restrictions: No   Pain: Pain Assessment Pain Scale: 0-10 Pain Score: 0-No pain ADL: ADL Eating: Set up Grooming: Setup Where Assessed-Grooming: Sitting at sink Upper Body Bathing: Supervision/safety Where Assessed-Upper Body Bathing: Shower Lower Body Bathing: Minimal assistance Where Assessed-Lower Body Bathing: Shower Upper Body Dressing: Minimal assistance Where Assessed-Upper Body Dressing: Sitting  at sink Lower Body Dressing: Moderate assistance Where Assessed-Lower Body Dressing: Wheelchair Toileting: Moderate assistance Where Assessed-Toileting: Bedside Commode, Toilet Toilet Transfer: Minimal assistance Toilet Transfer Method: Squat pivot Toilet Transfer Equipment: Grab bars, Raised toilet seat Social research officer, government: Moderate assistance Social research officer, government Method: Education officer, environmental: Radio broadcast assistant, Grab bars   Therapy/Group: Individual Therapy  [place] 07/08/2020, 12:24 PM

## 2020-07-09 MED ORDER — GABAPENTIN 100 MG PO CAPS
100.0000 mg | ORAL_CAPSULE | Freq: Three times a day (TID) | ORAL | Status: DC
  Administered 2020-07-09 – 2020-07-11 (×9): 100 mg via ORAL
  Filled 2020-07-09 (×10): qty 1

## 2020-07-09 NOTE — Progress Notes (Signed)
Occupational Therapy Weekly Progress Note  Patient Details  Name: [name] MRN: [id number] Date of Birth: 30-Jun-1965  Beginning of progress report period: July 01, 2020 End of progress report period: July 09, 2020  Today's Date: 07/09/2020 OT Individual Time: 1000-1115 OT Individual Time Calculation (min): 75 min    Patient has met 3 of 4 short term goals.  He is very motivated to work during [employer] and progress towards his long term goals.  Pt is exhibiting slow but steady gains in motor recovery and strength of LUE and LLE however still struggles to use left extremities in functional context due to remaining impairment.  Pt has improved his independence with squat pivot transfers and now only requires min to mod assist to complete at various surfaces.  Caregiver education initiated with pts wife who is also very motivated to learn and assist, but due to extent of pt's deficits, wife will benefit from further extensive training.  Pt is also working towards carryover of compensatory and hemi strategies throughout self care.  He requires repetitious training secondary to moderate impairments in attention, short term memory, and safety awareness (although cognitive skills are also steadily improving).  Pt would benefit from further skilled OT to maximize gains and further independence and safety during functional mobility and ADLs to ensure safe discharge home with wife.  Patient continues to demonstrate the following deficits: muscle weakness and muscle paralysis, decreased cardiorespiratoy endurance, unbalanced muscle activation, motor apraxia, decreased coordination, and decreased motor planning, decreased visual acuity, decreased visual perceptual skills, and decreased visual motor skills, decreased midline orientation and decreased attention to left, decreased attention, decreased awareness, decreased problem solving, decreased safety awareness, decreased memory, and delayed processing,  and decreased sitting balance, decreased standing balance, decreased postural control, hemiplegia, and decreased balance strategies and therefore will continue to benefit from skilled OT intervention to enhance overall performance with BADL.  Patient progressing toward long term goals..  Continue plan of care.  OT Short Term Goals Week 2:  OT Short Term Goal 1 (Week 2): Pt will complete transfers to toilet with squat pivot of mod A. OT Short Term Goal 1 - Progress (Week 2): Met OT Short Term Goal 2 (Week 2): Pt will be able rise to stand with mod A of 1. OT Short Term Goal 2 - Progress (Week 2): Met OT Short Term Goal 3 (Week 2): Pt will be able to don pants over his feet with min A. OT Short Term Goal 3 - Progress (Week 2): Progressing toward goal Week 3:  OT Short Term Goal 1 (Week 3): Pt will be able to don pants over his feet with min A. OT Short Term Goal 2 (Week 3): Pt will be able to complete pericare and clothing mgt during toileting with min A. OT Short Term Goal 3 (Week 3): Caregiver will assist pt with squat pivot functional transfers needing only min multimodal cues. OT Short Term Goal 4 (Week 3): Pt will be able to actively raise LUE to wash under arm with VCs only.  Therapy Documentation Precautions:  Precautions Precautions: Fall Precaution Comments: Lt hemi with right gaze preference Restrictions Weight Bearing Restrictions: No Pain: Pain Assessment Pain Scale: 0-10 Pain Score:  (unrated) Pain Type: Acute pain Pain Location: Groin Pain Orientation: Left Pain Descriptors / Indicators: Aching Pain Onset: On-going Pain Intervention(s): Repositioned;Rest;Distraction Multiple Pain Sites: No ADL: ADL Eating: Set up Grooming: Setup Where Assessed-Grooming: Sitting at sink Upper Body Bathing: Supervision/safety Where Assessed-Upper Body Bathing: Shower  Lower Body Bathing: Minimal assistance Where Assessed-Lower Body Bathing: Shower Upper Body Dressing: Minimal  assistance Where Assessed-Upper Body Dressing: Sitting at sink Lower Body Dressing: Moderate assistance Where Assessed-Lower Body Dressing: Wheelchair Toileting: Moderate assistance Where Assessed-Toileting: Bedside Commode, Toilet Toilet Transfer: Minimal assistance Toilet Transfer Method: Squat pivot Toilet Transfer Equipment: Grab bars, Raised toilet seat Social research officer, government: Moderate assistance Social research officer, government Method: Education officer, environmental: Radio broadcast assistant, Grab bars    Therapy/Group: Individual Therapy  Ezekiel Slocumb 07/09/2020, 3:16 PM

## 2020-07-09 NOTE — Progress Notes (Signed)
House PHYSICAL MEDICINE & REHABILITATION PROGRESS NOTE  Subjective/Complaints: Patient and wife report that diffuse pains- right lower back, left groin, and left shoulder were worse yesterday and today. Discussed restarting 100mg  Gabapentin and they are agreeable.   ROS:   Pt denies SOB, abd pain, CP, N/V/C/D, and vision changes, +left shoulder pain, right ankle pain has resolved, +right sided lower back pain, +left sided weakness.    Objective: Vital Signs: Blood pressure 126/84, pulse 85, temperature 98 F (36.7 C), temperature source Oral, resp. rate 18, height 5\' 10"  (1.778 m), weight 105.4 kg, SpO2 100 %. No results found. No results for input(s): WBC, HGB, HCT, PLT in the last 72 hours.   No results for input(s): NA, K, CL, CO2, GLUCOSE, BUN, CREATININE, CALCIUM in the last 72 hours.    Intake/Output Summary (Last 24 hours) at 07/09/2020 0930 Last data filed at 07/09/2020 0300 Gross per 24 hour  Intake 472 ml  Output 425 ml  Net 47 ml        Physical Exam: BP 126/84 (BP Location: Right Arm)   Pulse 85   Temp 98 F (36.7 C) (Oral)   Resp 18   Ht 5\' 10"  (1.778 m)   Wt 105.4 kg   SpO2 100%   BMI 33.34 kg/m   Gen: no distress, normal appearing HEENT: oral mucosa pink and moist, NCAT Cardio: Reg rate Chest: normal effort, normal rate of breathing Abd: soft, non-distended Ext: no edema Psych: pleasant, normal affect Skin: intact Neuro: Cerebellar exam normal finger to nose to finger as well as heel to shin in bilateral upper and lower extremities Musculoskeletal: Full range of motion in all 4 extremities. No joint swelling- L shoulder and L hand have Ktaping on them- Main TTP anterior shoulder c/w biceps tendon pain. Left shoulder with mild subluxation Dysarthria, stable Motor: LUE: 0/5 proximal to distal LLE: HF, KE 2-/5, ADF 1/5, unchanged Left inattention RUE/RLE: 5/5 proximal distal Sensation intact LT on RIght , absent on left     Assessment/Plan: 1. Functional deficits which require 3+ hours per day of interdisciplinary therapy in a comprehensive inpatient rehab setting. Physiatrist is providing close team supervision and 24 hour management of active medical problems listed below. Physiatrist and rehab team continue to assess barriers to discharge/monitor patient progress toward functional and medical goals   Care Tool:  Bathing    Body parts bathed by patient: Chest, Abdomen, Left arm, Face, Front perineal area, Right lower leg, Right upper leg, Left upper leg, Left lower leg, Right arm (used long sponge)   Body parts bathed by helper: Right arm, Buttocks Body parts n/a: Right arm, Buttocks, Left lower leg   Bathing assist Assist Level: Minimal Assistance - Patient > 75%     Upper Body Dressing/Undressing Upper body dressing   What is the patient wearing?: Pull over shirt    Upper body assist Assist Level: Minimal Assistance - Patient > 75%    Lower Body Dressing/Undressing Lower body dressing      What is the patient wearing?: Incontinence brief, Pants     Lower body assist Assist for lower body dressing: Moderate Assistance - Patient 50 - 74%     Toileting Toileting    Toileting assist Assist for toileting: Moderate Assistance - Patient 50 - 74% (with use of stedy)     Transfers Chair/bed transfer  Transfers assist     Chair/bed transfer assist level: Minimal Assistance - Patient > 75% (squat pivot)     Locomotion  Ambulation   Ambulation assist   Ambulation activity did not occur: Safety/medical concerns          Walk 10 feet activity   Assist  Walk 10 feet activity did not occur: Safety/medical concerns        Walk 50 feet activity   Assist Walk 50 feet with 2 turns activity did not occur: Safety/medical concerns         Walk 150 feet activity   Assist Walk 150 feet activity did not occur: Safety/medical concerns         Walk 10 feet on uneven  surface  activity   Assist Walk 10 feet on uneven surfaces activity did not occur: Safety/medical concerns         Wheelchair     Assist Will patient use wheelchair at discharge?:  (TBD)             Wheelchair 50 feet with 2 turns activity    Assist            Wheelchair 150 feet activity     Assist           Medical Problem List and Plan: 1.  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 Course complicated by seizure  Continue PT, OT and SLP  WHO/PRAFO nightly 2.  Impaired mobility -DVT/anticoagulation:  Pharmaceutical: Continue Lovenox             -antiplatelet therapy: N/A due to bleed 3.  Ongoing headaches/pain Management: Continue Depakote and Keppra.  Continue Topamax to 50mg  4. Mood: LCSW to follow for evaluation and support             -antipsychotic agents: N/AA 5. Neuropsych: This patient is intermittently capable of making decisions on his own behalf. 6. Skin/Wound Care: Routine pressure-relief measures. Eucerin ordered for dry skin. Recommended wife bring coconut oil from home.  7. Fluids/Electrolytes/Nutrition: Monitor intake/output. Discussed anti-inflammatory diet, replacing ginger ale with water during the day and tart cherry juice at night, cranberry juice if he really wants juice during the day. 9.  Partial complex seizure: Continue Keppra 500 mg twice daily. 10.  HTN: Monitor blood pressures 3 times daily.              -- Labile, continue amlodipine, labetalol, losartan and clonidine             -SBP goal of <160 given ICH  6/11-6/14- BP controlled- continue regimen Vitals:   07/08/20 1939 07/09/20 0315  BP: (!) 162/88 126/84  Pulse: 79 85  Resp: 18 18  Temp: 97.8 F (36.6 C) 98 F (36.7 C)  SpO2: 98% 100%   11.  Dyslipidemia: Continue statin. 12.  Acute kidney injury:   Resolved  13.  History of alcohol abuse/delirium: Continue Seroquel 25 mg at bedtime  to help with sleep/wake cycle. Discussed this would not be long term             -- Continue folic acid and thiamine supplementation 14.  Acute blood loss anemia:   Hemoglobin 11.8 on 6/1, stable at 11.4  Continue to monitor 15.  Morbid obesity/OHS/OSA?: Outpatient sleep study recommended for work-up 16.  Post stroke dysphagia  D3 thins, advance diet as tolerated  6/11- tolerating diet- no issues- con't regimen 17. Right ankle pain: suspect sprain given tenderness over ATFL. Ice 15mg  TID. Given chronicity of pain and pain with weightbearing, ordered right ankle XR- discussed with wife that there is no evidence  of fracture or athropathy. Continue voltaren gel, d/c capsaicin. Requested RN soak foot in epsom salt if available. Ankle support brace ordered and patient wearing. Advised to elevate when resting, minimize weightbearing while with therapy to allow ligament to heal. Provided list of anti-inflammatory foods for pain. Discussed risks and benefits of various medications such as tylenol, NSAIDs, and prednisone and patient and wife decided on above conservative treatment and tylenol for now.    -improved.  18.  Urinary urgency but unable to void, suspect spastic bladder with BPH , start flomax, BP should support this med , as discussed with RN would check bladder scan as well  19. Left shoulder pain: continue lidocaine and voltaren gel. Advised wife to bring in blue emu oil at home. Discussed XR results: mild subluxation  20. Confusion in morning as per wife: monitor for worsening with restart of Gabapentin. 21. Low back pain, right side: continue heating pad and add lidocaine patch.   22. Left groin pain: restart Gabapentin 100mg  TID 22. Disposition: HFU scheduled with me on 09/17/20 1:00PM. FL2 form completed, wife has requested copy as well.    LOS: 17 days A FACE TO FACE EVALUATION WAS PERFORMED  [name] P [name] 07/09/2020, 9:30 AM

## 2020-07-09 NOTE — Plan of Care (Signed)
  Problem: RH Tub/Shower Transfers Goal: LTG Patient will perform tub/shower transfers w/assist (OT) Description: LTG: Patient will perform tub/shower transfers with assist, with/without cues using equipment (OT) Outcome: Not Applicable Note: D/c this goal due to pt bathroom at home is not accessible using w/c, therefore goal is no longer indicated at this time.

## 2020-07-09 NOTE — Progress Notes (Signed)
Occupational Therapy Session Note  Patient Details  Name: Jeb Schloemer MRN: [id number] Date of Birth: 03/11/1965  Today's Date: 07/09/2020 OT Individual Time: 1000-1115 OT Individual Time Calculation (min): 75 min    Short Term Goals: Week 2:  OT Short Term Goal 1 (Week 2): Pt will complete transfers to toilet with squat pivot of mod A. OT Short Term Goal 2 (Week 2): Pt will be able rise to stand with mod A of 1. OT Short Term Goal 3 (Week 2): Pt will be able to don pants over his feet with min A.   Skilled Therapeutic Interventions/Progress Updates:    Pt sitting in recliner, reporing his right hip and bilateral groin area is hurting about a 5/10 at rest and a 7/10 when he completes squat pivot transfers.  Pt requesting to refrain from squat pivot this session to address pain.  Pt completed sit<>stand at stedy needing CGA and hand over hand to left digits for grasp of stedy bar and with support strap applied to to BLE to reduce ER to improve body mechanics.  Pt completed stand to sit from stedy to w/c with CGA.  Transported to ortho gym for neuro re-ed of LUE and periscapular musculature.  Neuro re-ed strategies included mirror placed in front of pt for increased visual feedback, facilitatory tactile cues provided to muscle belly, as well as external visual target cues to promote motor learning and desired movement outcomes.  Pt completed 15 reps of shoulder shrugs, scapular retraction, shoulder extension, and ER/IR with humerus in scapular plane.  Pt then attempting to combine isolated movements to reach and grasp weightless dowel and bring towards body.  Pt needing assist to fully extend digits in order to grasp dowel.   Pt transported back to room and completed sit<>stand at stedy with medium sized ball between BLE with cues provided to squeeze ball while completing transfer.  Pt exhibited ability to maintain ball between BLE and actively improved body mechanics/LLE alignment. Stand to sit at  recliner in same manner with CGA.  Call bell in reach, seat alarm on, needs in reach, BLE supported on foot rest.    Therapy Documentation Precautions:  Precautions Precautions: Fall Precaution Comments: Lt hemi with right gaze preference Restrictions Weight Bearing Restrictions: No   Therapy/Group: Individual Therapy  Amie Critchley 07/09/2020, 12:46 PM

## 2020-07-09 NOTE — Progress Notes (Addendum)
Occupational Therapy Session Note  Patient Details  Name: [name] MRN: [id number] Date of Birth: 12/08/1965  Today's Date: 07/09/2020 OT Individual Time: 0800-0905 OT Individual Time Calculation (min): 65 min    Short Term Goals: Week 2:  OT Short Term Goal 1 (Week 2): Pt will complete transfers to toilet with squat pivot of mod A. OT Short Term Goal 2 (Week 2): Pt will be able rise to stand with mod A of 1. OT Short Term Goal 3 (Week 2): Pt will be able to don pants over his feet with min A.  Skilled Therapeutic Interventions/Progress Updates:    Pt received supine in bed, agreeable to OT session, reporting slight/unrated pain in L side of groin (ongoing). Dependent to don RLE ankle brace and LUE splint; pt noted with slight swelling in LUE/hand. Sup<>sit to L side mod A for LLE management and supporting LUE, use of bed rails. Pt donned shoes sitting EOB with max A. Squat pivot transfer mod A bed<>w/c with vc's for technique, foot placement, and support of LUE and LLE blocking. Min A to scoot to back of w/c. Pt found to have wet brief, so transferred back to bed to complete toileting and brief change. Pt education on hooking RLE under LLE to bring into bed with good carryover and min A for placement. Pt needing mod A to bend LLE on bed to complete bridging hips while removing and placing brief. Pt completed perihygiene set up A. Mod A to roll to R side due to L hemi. Mod-min A to roll to L side using bed rails. Pt engaged in NMR activities targeting muscle activation in LUE sitting EOB. Pt education and trials of isometric exercises targeting scapular and biceps/triceps ROM and activation of musculature with vc's for technique. Pt noted with trace activation of triceps, deltoid, and scapular musculature (unable to complete scapular elevation/depression); noted activation of biceps during elbow FLX against slight resistance. Pt education and trials in towel pulls/pushes on tabletop as well as  using hand on ball to attempt activation of musculature. Pt demos good understanding and motivation of HEP and d/c plan for OPOT. Squat pivot transfer bed>recliner, positioned LUE with pillow, seat alarm active, and all needs met.  Therapy Documentation Precautions:  Precautions Precautions: Fall Precaution Comments: Lt hemi with right gaze preference Restrictions Weight Bearing Restrictions: No  Pain: Pain Assessment Pain Scale: 0-10 Pain Score:  (unrated) Pain Type: Acute pain Pain Location: Groin Pain Orientation: Left Pain Descriptors / Indicators: Aching Pain Onset: On-going Pain Intervention(s): Repositioned;Rest;Distraction Multiple Pain Sites: No   Therapy/Group: Individual Therapy   N  07/09/2020, 12:23 PM 

## 2020-07-09 NOTE — Progress Notes (Signed)
Physical Therapy Session Note  Patient Details  Name: [name] MRN: [id number] Date of Birth: [birth year]  Today's Date: 07/09/2020 PT Individual Time: 1415-1526 PT Individual Time Calculation (min): 71 min   Short Term Goals: Week 3:  PT Short Term Goal 1 (Week 3): Pt will complete bed mobility with no more than minA consistently PT Short Term Goal 2 (Week 3): Pt will complete sit<>stand transfers with no more than minA consistently with LRAD PT Short Term Goal 3 (Week 3): Pt will complete bed<>chair transfers with minA and LRAD PT Short Term Goal 4 (Week 3): Pt will ambulate 65ft with +2 modA PT Short Term Goal 5 (Week 3): Pt will propel himself 64ft with modA on level surfaces via hemi-technique  Skilled Therapeutic Interventions/Progress Updates:    Pt received sitting in recliner and agreeable to PT tx - reports generalized low back pain but does not request pain rx. Rest breaks and mobility provided for pain management. Pt able to reposition himself in w/c with supervision and cues, extra time needed. Completed sit<>stand transfer in [place] with minA from recliner height - placed ball in between legs to promote adduction in LLE during transition. Stedy transfer to standard w/c and then wheeled to main rehab gym. Focused majority of session on blocked practice squat pivot transfers and lateral scoots along mat table. Initially worked on lateral scoots - breaking this down into 2 parts: lifting and scooting. He demonstrated improved ability to clear buttock during lateral scoots after several repetitions - he did need assistance for L foot placement during these. He completed x5 reps of squat<>pivot transfers from mat table to w/c in both L and R directions. Next, worked on hemi-technique for w/c propulsion - removed w/c cushion to allow lower height to ground to improve R foot contact. Pt was able to propel himself 2x130ft (!!!) with supervision (!!) on level surfaces with RUE and RLE. Next,  worked on [employer] with gait training in // bars - required +2 maxA for ambulating ~70ft - needing assist for advancing/placing LLE as well as lateral weight shifts to offload hips to advance LE's. Next, retrieved a ROHO cushion to allow improved skin integrity in sitting and sitting tolerance in w/c. He was returned to his room where he remained seated in w/c with 1/2 lap tray for LUE and safety belt alarm on, all needs within reach.  Therapy Documentation Precautions:  Precautions Precautions: Fall Precaution Comments: Lt hemi with right gaze preference Restrictions Weight Bearing Restrictions: No General:     Therapy/Group: Individual Therapy  Orrin Brigham 07/09/2020, 7:34 AM

## 2020-07-10 DIAGNOSIS — I69319 Unspecified symptoms and signs involving cognitive functions following cerebral infarction: Secondary | ICD-10-CM

## 2020-07-10 DIAGNOSIS — Z515 Encounter for palliative care: Secondary | ICD-10-CM

## 2020-07-10 DIAGNOSIS — Z7189 Other specified counseling: Secondary | ICD-10-CM

## 2020-07-10 MED ORDER — ACETAMINOPHEN 325 MG PO TABS
650.0000 mg | ORAL_TABLET | Freq: Two times a day (BID) | ORAL | Status: AC
  Administered 2020-07-10 – 2020-07-15 (×10): 650 mg via ORAL
  Filled 2020-07-10 (×10): qty 2

## 2020-07-10 MED ORDER — TRAMADOL HCL 50 MG PO TABS
50.0000 mg | ORAL_TABLET | Freq: Four times a day (QID) | ORAL | Status: DC | PRN
  Administered 2020-07-10 – 2020-07-15 (×8): 50 mg via ORAL
  Filled 2020-07-10 (×8): qty 1

## 2020-07-10 NOTE — Consult Note (Addendum)
Consultation Note Date: 07/10/2020   Patient Name: [name]  DOB: [birth year]  MRN: [id number]  Age / Sex: 55 y.o., male  PCP: Pcp, No Referring Physician: Izora Ribas, MD  Reason for Consultation: Hospice Evaluation and pain management  HPI/Patient Profile: 55 y.o. male  with past medical history of mixed systolic and diastolic HF, hypertensive cardiomyopathy, and obesity, who was admitted on 06/22/2020 after treatment in [facility] of hemorrhagic CVA.  On the initial admission to [facility] (06/08/20) his SBP was in the 200 - 230 range.  He had hemiplegia and right sided gaze.  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  He has had difficulty with agitation and dysphagia, but has improved.  He is now on a regular diet.  Seroquel and Depakote was started for agitation. Shortly prior to DC to [facility] he had seizure symptoms.  Clinical Assessment and Goals of Care: I have reviewed medical records including EPIC notes, labs and imaging, received report from RN, assessed the patient and then met at the bedside along with his wife [name] to discuss diagnosis prognosis, GOC, EOL wishes, disposition and options.  After meeting and chatting with Mr. Taglieri for a moment, Durant Scibilia asked if we could speak outside of the room.  [name] is struggling mightly with anxiety over how to care for her husband once he is discharged.  Because of his age he does not qualify for medicare.  She has applied for Medicaid for him but does not know if he will be approved.   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  Mr. Gentzler has no children and no other family.   [name] has to continue to work to support the two of them.  Mr. Bucklew is unable to be at home alone.  [name] and I discussed not only the current hemorrhagic stroke but the possibility of the beginnings of dementia as well.  This  will likely progress faster now after his stroke.  [name] is quite distressed as her husband wants to go home and have things be normal "go out and cook on the BBQ" and he doesn't seem to understand that he can not do that any longer.  He needs 24 hour care in the home.  We discussed what Hospice and Palliative Care services are and what conditions make patient's eligible for these services.  At this point Mr. Drewes is eating well, he can walk with assistance and he carries a strong will to live.  His is certainly a good candidate for outpatient Palliative services, but he is not eligible for Hospice services.     We talked briefly about [facility] and I suggested that [name] look into it on line as they accept patient's 21 years and older.  I returned later in the day and spoke with Mr. Wyka about his pain.  He reports left groin pain, side pain and left shoulder pain.  The shoulder and groin  pain started with his fall.  The pain starts in his groin and shoots into his shoulder.  Sometimes it wakes him from sleep.  The tylenol and diclofenac gel is helpful in relieving his pain.  The side pain started about 5 days ago.  Mr. Fawbush attributes it to doing physical therapy and using muscles that are not used to being used.  We discussed utilizing tylenol on a scheduled basis and adding tramadol for break thru pain.  Questions and concerns were addressed.  The family was encouraged to call with questions or concerns.  PMT is happy to see the family again if patient's condition changes or if we can be of assistance in any way.    Primary Decision Maker:  NEXT OF KIN  Wife [name]    SUMMARY OF RECOMMENDATIONS    Continue current care. Scheduled tylenol 6 am and 6 pm.  Continue PRN as well for moderate pain.  Added Tramadol for pain not helped by tylenol - 50 mg q 6 hours PRN. I suggested to [place] that she visit her PCP for support with her anxiety and help sleeping I suggested to [place] that  she look into and consider self referring to [facility] (gave [name] their number). I'm concerned that his Medicaid will be denied as he currently has insurance and I encouraged [name] to ask "why" if he is denied and then to re-apply. Mr. Parlato is eating well, he can walk with assistance and he carries a strong will to live.  His is certainly a good candidate for outpatient Palliative services, but he is not eligible for Hospice services.    I committed to [place] that I will run her situation by my team for other suggestions. I attempted to talk with Rehab CSW but unfortunately they are not on service on the weekends.    Code Status/Advance Care Planning: Full   Symptom Management:  Per primary team.  Additional Recommendations (Limitations, Scope, Preferences): Full Scope Treatment   Psycho-social/Spiritual:  Desire for further Chaplaincy support:  not discussed.  Prognosis: unable to determine.   Discharge Planning: To Be Determined      Primary Diagnoses: Present on Admission:  Thalamic hemorrhage ([place])   I have reviewed the medical record, interviewed the patient and family, and examined the patient. The following aspects are pertinent.  Past Medical History:  Diagnosis Date   Chronic combined systolic and diastolic CHF (congestive heart failure) (HCC)    EF 44-03%   Diastolic dysfunction    Dyslipidemia    Hyperlipidemia    Hypertension    Nonischemic dilated cardiomyopathy ([place])    felt secondary to HTN with no ischemia on nuclear stress test 05/2012, EF 25-30%   Shortness of breath    Social History   Socioeconomic History   Marital status: Married    Spouse name: Not on file   Number of children: Not on file   Years of education: Not on file   Highest education level: Not on file  Occupational History   Not on file  Tobacco Use   Smoking status: Never   Smokeless tobacco: Never  Vaping Use   Vaping Use: Never used  Substance and Sexual  Activity   Alcohol use: Yes    Alcohol/week: 24.0 standard drinks    Types: 24 Cans of beer per week    Comment: 2 beers per day   Drug use: No   Sexual activity: Yes  Other Topics Concern   Not on  file  Social History Narrative   Not on file   Social Determinants of Health   Financial Resource Strain: Not on file  Food Insecurity: Not on file  Transportation Needs: Not on file  Physical Activity: Not on file  Stress: Not on file  Social Connections: Not on file   Family History  Problem Relation Age of Onset   Hypertension Mother        Passed away when she is 37 secondary to hip surgery complications   CAD Mother    Heart attack Mother    Hypertension Father    Heart attack Maternal Grandmother     No Known Allergies   Vital Signs: BP 134/85 (BP Location: Right Arm)   Pulse 86   Temp 99.7 F (37.6 C) (Oral)   Resp 19   Ht _0  (1.778 m)   Wt 105.4 kg   SpO2 97%   BMI 33.34 kg/m  Pain Scale: 0-10 POSS *See Group Information*: 1-Acceptable,Awake and alert Pain Score: Asleep   SpO2: SpO2: 97 % O2 Device:SpO2: 97 % O2 Flow Rate: .     Palliative Assessment/Data:  50%     Time In: 2:00 Time Out: 2:50 Time Total: 50 min. Visit consisted of counseling and education dealing with the complex and emotionally intense issues surrounding the need for palliative care and symptom management in the setting of serious and potentially life-threatening illness. Greater than 50%  of this time was spent counseling and coordinating care related to the above assessment and plan.  Signed by: Florentina Jenny, PA-C Palliative Medicine  Please contact Palliative Medicine Team phone at [phone] for questions and concerns.  For individual provider: See Shea Evans

## 2020-07-10 NOTE — Plan of Care (Signed)
  Problem: Consults Goal: RH STROKE PATIENT EDUCATION Description: See Patient Education module for education specifics  Outcome: Progressing   Problem: RH BOWEL ELIMINATION Goal: RH STG MANAGE BOWEL WITH ASSISTANCE Description: STG Manage Bowel with  mod I Assistance. Outcome: Progressing Goal: RH STG MANAGE BOWEL W/MEDICATION W/ASSISTANCE Description: STG Manage Bowel with Medication with  mod I Assistance. Outcome: Progressing   Problem: RH BLADDER ELIMINATION Goal: RH STG MANAGE BLADDER WITH ASSISTANCE Description: STG Manage Bladder With  min Assistance Outcome: Progressing   Problem: RH SAFETY Goal: RH STG ADHERE TO SAFETY PRECAUTIONS W/ASSISTANCE/DEVICE Description: STG Adhere to Safety Precautions With cues/reminders Assistance/Device. Outcome: Progressing   Problem: RH PAIN MANAGEMENT Goal: RH STG PAIN MANAGED AT OR BELOW PT'S PAIN GOAL Description: At or below level 4 Outcome: Progressing   Problem: RH KNOWLEDGE DEFICIT Goal: RH STG INCREASE KNOWLEDGE OF HYPERTENSION Description: Patient and wife will be able to manage HTN with medications and dietary modifications using handouts and educational resources independently Outcome: Progressing Goal: RH STG INCREASE KNOWLEDGE OF DYSPHAGIA/FLUID INTAKE Description: Patient and wife will be able to manage dysphagia,  medications and dietary modifications using handouts and educational resources independently Outcome: Progressing Goal: RH STG INCREASE KNOWLEGDE OF HYPERLIPIDEMIA Description: Patient and wife will be able to manage HLD with medications and dietary modifications using handouts and educational resources independently Outcome: Progressing Goal: RH STG INCREASE KNOWLEDGE OF STROKE PROPHYLAXIS Description: Patient and wife will be able to manage secondary stroke prophylaxis with medications and dietary modifications using handouts and educational resources independently Outcome: Progressing   Problem:  Education: Goal: Individualized Educational Video(s) Description: Patient and wife will be able to manage HF with medications and dietary modifications using handouts and educational resources independently Outcome: Progressing   Problem: Consults Goal: RH STROKE PATIENT EDUCATION Description: See Patient Education module for education specifics  Outcome: Progressing

## 2020-07-10 NOTE — Progress Notes (Signed)
Speech Language Pathology Daily Session Note  Patient Details  Name: [name] MRN: [id number] Date of Birth: [birth year]  Today's Date: 07/10/2020 SLP Individual Time: 1418-1430 SLP Individual Time Calculation (min): 12 min and Today's Date: 07/10/2020 SLP Missed Time: 18 Minutes Missed Time Reason: Nursing care  Short Term Goals: Week 3: SLP Short Term Goal 1 (Week 3): Patient will consume current diet with minimal overt s/s of aspiration with mod I swallowing compensatory strategies. SLP Short Term Goal 2 (Week 3): Patient will recall new, daily information with supervision verbal and visual cues SLP Short Term Goal 3 (Week 3): Pt will demonstrate selective attention for 30-45 minutes in mildly distracting environments with supervision A verbal cues. SLP Short Term Goal 4 (Week 3): Pt will demonstrate mildly complex problem solving skills in functional tasks with min A verbal cues. SLP Short Term Goal 5 (Week 3): Pt will demonstrate self-monitoring and self-correction of errors in problem solving tasks with min A verbal cues.  Skilled Therapeutic Interventions: Pt seen for skilled ST with focus on cognitive goals, missing first 18 minutes of tx session d/t nursing care. Palliative PA present at beginning of tx, speaking with wife in room then outside for privacy. Pt participating in mildly complex scheduling task with 90% accuracy provided min cues for visual scanning and decreasing response time/impulsiveness. Pt left in bed with alarm set and all needs within reach. Spoke with Palliative PA and wife about tx session, progress toward goals and remaining deficits. Cont ST POC.   Pain Pain Assessment Pain Scale: 0-10 Pain Score: 0-No pain  Therapy/Group: Individual Therapy  Tacey Ruiz 07/10/2020, 3:33 PM

## 2020-07-10 NOTE — Progress Notes (Signed)
[facility] PHYSICAL MEDICINE & REHABILITATION PROGRESS NOTE  Subjective/Complaints:  No issues overnite , still with groin pain, medial , no prior hx, has had for >1wk  ROS:   Pt denies SOB, abd pain, CP, N/V/C/D, and vision changes, +left shoulder pain, right ankle pain has resolved, +right sided lower back pain, +left sided weakness.    Objective: Vital Signs: Blood pressure 115/69, pulse 84, temperature 98.3 F (36.8 C), resp. rate 20, height 5\' 10"  (1.778 m), weight 105.4 kg, SpO2 100 %. No results found. No results for input(s): WBC, HGB, HCT, PLT in the last 72 hours.   No results for input(s): NA, K, CL, CO2, GLUCOSE, BUN, CREATININE, CALCIUM in the last 72 hours.    Intake/Output Summary (Last 24 hours) at 07/10/2020 1134 Last data filed at 07/10/2020 0719 Gross per 24 hour  Intake 440 ml  Output 800 ml  Net -360 ml         Physical Exam: BP 115/69 (BP Location: Right Arm)   Pulse 84   Temp 98.3 F (36.8 C)   Resp 20   Ht 5\' 10"  (1.778 m)   Wt 105.4 kg   SpO2 100%   BMI 33.34 kg/m    General: No acute distress Mood and affect are appropriate Heart: Regular rate and rhythm no rubs murmurs or extra sounds Lungs: Clear to auscultation, breathing unlabored, no rales or wheezes Abdomen: Positive bowel sounds, soft nontender to palpation, nondistended Extremities: No clubbing, cyanosis, or edema Skin: No evidence of breakdown, no evidence of rash   Neuro: Cerebellar exam normal finger to nose to finger as well as heel to shin in bilateral upper and lower extremities Musculoskeletal: Full range of motion in all 4 extremities. No joint swelling- L shoulder and L hand have Ktaping on them- tenderness at left hip add origin Dysarthria, stable Motor: LUE: 0/5 proximal to distal LLE: HF, KE 2-/5, ADF 1/5, unchanged Left inattention RUE/RLE: 5/5 proximal distal Sensation intact LT on RIght , absent on left    Assessment/Plan: 1. Functional deficits which  require 3+ hours per day of interdisciplinary therapy in a comprehensive inpatient rehab setting. Physiatrist is providing close team supervision and 24 hour management of active medical problems listed below. Physiatrist and rehab team continue to assess barriers to discharge/monitor patient progress toward functional and medical goals   Care Tool:  Bathing    Body parts bathed by patient: Chest, Abdomen, Left arm, Face, Front perineal area, Right lower leg, Right upper leg, Left upper leg, Left lower leg, Right arm (used long sponge)   Body parts bathed by helper: Right arm, Buttocks Body parts n/a: Right arm, Buttocks, Left lower leg   Bathing assist Assist Level: Minimal Assistance - Patient > 75%     Upper Body Dressing/Undressing Upper body dressing   What is the patient wearing?: Pull over shirt    Upper body assist Assist Level: Minimal Assistance - Patient > 75%    Lower Body Dressing/Undressing Lower body dressing      What is the patient wearing?: Incontinence brief, Pants     Lower body assist Assist for lower body dressing: Moderate Assistance - Patient 50 - 74%     Toileting Toileting    Toileting assist Assist for toileting: Maximal Assistance - Patient 25 - 49%     Transfers Chair/bed transfer  Transfers assist     Chair/bed transfer assist level: Minimal Assistance - Patient > 75% (squat pivot)     Locomotion Ambulation  Ambulation assist   Ambulation activity did not occur: Safety/medical concerns          Walk 10 feet activity   Assist  Walk 10 feet activity did not occur: Safety/medical concerns        Walk 50 feet activity   Assist Walk 50 feet with 2 turns activity did not occur: Safety/medical concerns         Walk 150 feet activity   Assist Walk 150 feet activity did not occur: Safety/medical concerns         Walk 10 feet on uneven surface  activity   Assist Walk 10 feet on uneven surfaces activity did  not occur: Safety/medical concerns         Wheelchair     Assist Will patient use wheelchair at discharge?:  (TBD)             Wheelchair 50 feet with 2 turns activity    Assist            Wheelchair 150 feet activity     Assist           Medical Problem List and Plan: 1.  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 Course complicated by seizure  Continue PT, OT and SLP  WHO/PRAFO nightly 2.  Impaired mobility -DVT/anticoagulation:  Pharmaceutical: Continue Lovenox             -antiplatelet therapy: N/A due to bleed 3.  Ongoing headaches/pain Management: Continue Depakote and Keppra.  Continue Topamax to 50mg  4. Mood: LCSW to follow for evaluation and support             -antipsychotic agents: N/AA 5. Neuropsych: This patient is intermittently capable of making decisions on his own behalf. 6. Skin/Wound Care: Routine pressure-relief measures. Eucerin ordered for dry skin. Recommended wife bring coconut oil from home.  7. Fluids/Electrolytes/Nutrition: Monitor intake/output. Discussed anti-inflammatory diet, replacing ginger ale with water during the day and tart cherry juice at night, cranberry juice if he really wants juice during the day. 9.  Partial complex seizure: Continue Keppra 500 mg twice daily. 10.  HTN: Monitor blood pressures 3 times daily.              -- Labile, continue amlodipine, labetalol, losartan and clonidine             -SBP goal of <160 given ICH  6/11-6/14- BP controlled- continue regimen Vitals:   07/09/20 1923 07/10/20 0425  BP: (!) 141/89 115/69  Pulse: 89 84  Resp: 20 20  Temp: (!) 97.4 F (36.3 C) 98.3 F (36.8 C)  SpO2: 100% 100%   11.  Dyslipidemia: Continue statin. 12.  Acute kidney injury:   Resolved  13.  History of alcohol abuse/delirium: Continue Seroquel 25 mg at bedtime to help with sleep/wake cycle. Discussed this would not be long  term             -- Continue folic acid and thiamine supplementation 14.  Acute blood loss anemia:   Hemoglobin 11.8 on 6/1, stable at 11.4  Continue to monitor 15.  Morbid obesity/OHS/OSA?: Outpatient sleep study recommended for work-up 16.  Post stroke dysphagia  D3 thins, advance diet as tolerated  6/11- tolerating diet- no issues- con't regimen 17. Right ankle pain: suspect sprain given tenderness over ATFL. Ice 15mg  TID. Given chronicity of pain and pain with weightbearing, ordered right ankle XR- discussed with wife that there is no evidence of fracture  or athropathy. Continue voltaren gel, d/c capsaicin. Requested RN soak foot in epsom salt if available. Ankle support brace ordered and patient wearing. Advised to elevate when resting, minimize weightbearing while with therapy to allow ligament to heal. Provided list of anti-inflammatory foods for pain. Discussed risks and benefits of various medications such as tylenol, NSAIDs, and prednisone and patient and wife decided on above conservative treatment and tylenol for now.    -improved.  18.  Urinary urgency but unable to void, suspect spastic bladder with BPH , start flomax, BP should support this med , as discussed with RN would check bladder scan as well  19. Left shoulder pain: continue lidocaine and voltaren gel. Advised wife to bring in blue emu oil at home. Discussed XR results: mild subluxation  20. Confusion in morning as per wife: monitor for worsening with restart of Gabapentin. 21. Low back pain, right side: continue heating pad and add lidocaine patch.   22. Left groin pain: restart Gabapentin 100mg  TID- may be more muscular, stays in ext rotation at hip due to LLE weakness 22. Disposition: HFU scheduled with me on 09/17/20 1:00PM. FL2 form completed, wife has requested copy as well.    LOS: 18 days A FACE TO FACE EVALUATION WAS PERFORMED  09/19/20 07/10/2020, 11:34 AM

## 2020-07-11 NOTE — Progress Notes (Addendum)
Occupational Therapy Session Note  Patient Details  Name: [name] MRN: [id number] Date of Birth: Jun 20, 1965  Today's Date: 07/11/2020 OT Group Time:  - 60 minutes missed     Skilled Therapeutic Interventions/Progress Updates:    Pt declined participation today due to family visiting and wanting to sit outdoors with family on Father's Day. 60 minutes missed.   Therapy Documentation Precautions:  Precautions Precautions: Fall Precaution Comments: Lt hemi with right gaze preference Restrictions Weight Bearing Restrictions: No  Vital Signs: Therapy Vitals Temp: 97.8 F (36.6 C) Temp Source: Oral Pulse Rate: 76 Resp: 20 BP: 118/71 Patient Position (if appropriate): Lying Oxygen Therapy SpO2: 96 % O2 Device: Room Air Pain: Pain Assessment Pain Scale: 0-10 Pain Score: 6  Pain Type: Acute pain Pain Location: Shoulder Pain Orientation: Left Pain Descriptors / Indicators: Sharp Pain Frequency: Intermittent Pain Onset: On-going Pain Intervention(s): Medication (See eMAR) ADL: ADL Eating: Set up Grooming: Setup Where Assessed-Grooming: Sitting at sink Upper Body Bathing: Supervision/safety Where Assessed-Upper Body Bathing: Shower Lower Body Bathing: Minimal assistance Where Assessed-Lower Body Bathing: Shower Upper Body Dressing: Minimal assistance Where Assessed-Upper Body Dressing: Sitting at sink Lower Body Dressing: Moderate assistance Where Assessed-Lower Body Dressing: Wheelchair Toileting: Moderate assistance Where Assessed-Toileting: Bedside Commode, Toilet Toilet Transfer: Minimal assistance Toilet Transfer Method: Squat pivot Toilet Transfer Equipment: Grab bars, Raised toilet seat Film/video editor: Moderate assistance Film/video editor Method: Administrator: Emergency planning/management officer, Grab bars     Therapy/Group: Group Therapy  [employer] 07/11/2020, 7:19 AM

## 2020-07-11 NOTE — Plan of Care (Signed)
  Problem: Consults Goal: RH STROKE PATIENT EDUCATION Description: See Patient Education module for education specifics  Outcome: Progressing   Problem: RH BOWEL ELIMINATION Goal: RH STG MANAGE BOWEL WITH ASSISTANCE Description: STG Manage Bowel with  mod I Assistance. Outcome: Progressing Goal: RH STG MANAGE BOWEL W/MEDICATION W/ASSISTANCE Description: STG Manage Bowel with Medication with  mod I Assistance. Outcome: Progressing   Problem: RH BLADDER ELIMINATION Goal: RH STG MANAGE BLADDER WITH ASSISTANCE Description: STG Manage Bladder With  min Assistance Outcome: Progressing   Problem: RH SAFETY Goal: RH STG ADHERE TO SAFETY PRECAUTIONS W/ASSISTANCE/DEVICE Description: STG Adhere to Safety Precautions With cues/reminders Assistance/Device. Outcome: Progressing   Problem: RH PAIN MANAGEMENT Goal: RH STG PAIN MANAGED AT OR BELOW PT'S PAIN GOAL Description: At or below level 4 Outcome: Progressing   Problem: RH KNOWLEDGE DEFICIT Goal: RH STG INCREASE KNOWLEDGE OF HYPERTENSION Description: Patient and wife will be able to manage HTN with medications and dietary modifications using handouts and educational resources independently Outcome: Progressing Goal: RH STG INCREASE KNOWLEDGE OF DYSPHAGIA/FLUID INTAKE Description: Patient and wife will be able to manage dysphagia,  medications and dietary modifications using handouts and educational resources independently Outcome: Progressing Goal: RH STG INCREASE KNOWLEGDE OF HYPERLIPIDEMIA Description: Patient and wife will be able to manage HLD with medications and dietary modifications using handouts and educational resources independently Outcome: Progressing Goal: RH STG INCREASE KNOWLEDGE OF STROKE PROPHYLAXIS Description: Patient and wife will be able to manage secondary stroke prophylaxis with medications and dietary modifications using handouts and educational resources independently Outcome: Progressing   Problem:  Education: Goal: Individualized Educational Video(s) Description: Patient and wife will be able to manage HF with medications and dietary modifications using handouts and educational resources independently Outcome: Progressing   Problem: Consults Goal: RH STROKE PATIENT EDUCATION Description: See Patient Education module for education specifics  Outcome: Progressing   

## 2020-07-12 LAB — BASIC METABOLIC PANEL
Anion gap: 9 (ref 5–15)
BUN: 16 mg/dL (ref 6–20)
CO2: 26 mmol/L (ref 22–32)
Calcium: 9.7 mg/dL (ref 8.9–10.3)
Chloride: 105 mmol/L (ref 98–111)
Creatinine, Ser: 1.24 mg/dL (ref 0.61–1.24)
GFR, Estimated: >60 mL/min (ref >60)
Glucose, Bld: 97 mg/dL (ref 70–99)
Potassium: 3.9 mmol/L (ref 3.5–5.1)
Sodium: 140 mmol/L (ref 135–145)

## 2020-07-12 LAB — CBC
HCT: 35.6 % — ABNORMAL LOW (ref 39.0–52.0)
Hemoglobin: 11.2 g/dL — ABNORMAL LOW (ref 13.0–17.0)
MCH: 30.1 pg (ref 26.0–34.0)
MCHC: 31.5 g/dL (ref 30.0–36.0)
MCV: 95.7 fL (ref 80.0–100.0)
Platelets: 267 10*3/uL (ref 150–400)
RBC: 3.72 MIL/uL — ABNORMAL LOW (ref 4.22–5.81)
RDW: 12.2 % (ref 11.5–15.5)
WBC: 5.2 10*3/uL (ref 4.0–10.5)
nRBC: 0 % (ref 0.0–0.2)

## 2020-07-12 MED ORDER — GABAPENTIN 100 MG PO CAPS
200.0000 mg | ORAL_CAPSULE | Freq: Three times a day (TID) | ORAL | Status: DC
  Administered 2020-07-12 – 2020-07-21 (×28): 200 mg via ORAL
  Filled 2020-07-12 (×28): qty 2

## 2020-07-12 NOTE — Progress Notes (Signed)
[facility] PHYSICAL MEDICINE & REHABILITATION PROGRESS NOTE  Subjective/Complaints: Continues to have left sided groin and pectoralis pain and right sided lower back pain. Lidocaine injected to left pec today. Discussed Botox as option. OT to try sling today. Increasing Gabapentin to 200mg  TID.   ROS:   Pt denies SOB, abd pain, CP, N/V/C/D, and vision changes, +left pec pain, right ankle pain has resolved, +right sided lower back pain, +left sided weakness.    Objective: Vital Signs: Blood pressure 132/76, pulse 80, temperature 97.8 F (36.6 C), resp. rate 18, height 5\' 10"  (1.778 m), weight 105.4 kg, SpO2 97 %. No results found. Recent Labs    07/12/20 0616  WBC 5.2  HGB 11.2*  HCT 35.6*  PLT 267     Recent Labs    07/12/20 0616  NA 140  K 3.9  CL 105  CO2 26  GLUCOSE 97  BUN 16  CREATININE 1.24  CALCIUM 9.7      Intake/Output Summary (Last 24 hours) at 07/12/2020 1021 Last data filed at 07/12/2020 0911 Gross per 24 hour  Intake 1080 ml  Output 600 ml  Net 480 ml        Physical Exam: BP 132/76 (BP Location: Right Wrist)   Pulse 80   Temp 97.8 F (36.6 C)   Resp 18   Ht 5\' 10"  (1.778 m)   Wt 105.4 kg   SpO2 97%   BMI 33.34 kg/m   Gen: no distress, normal appearing HEENT: oral mucosa pink and moist, NCAT Cardio: Reg rate Chest: normal effort, normal rate of breathing Abd: soft, non-distended Ext: no edema Psych: pleasant, normal affect Skin: intact Neuro: Cerebellar exam normal finger to nose to finger as well as heel to shin in bilateral upper and lower extremities Musculoskeletal: Full range of motion in all 4 extremities. No joint swelling- L shoulder and L hand have Ktaping on them- tenderness at left hip add origin Left pec tender to palpation Dysarthria, stable Motor: LUE: 0/5 proximal to distal LLE: HF, KE 2-/5, ADF 1/5, unchanged Left inattention RUE/RLE: 5/5 proximal distal Sensation intact LT on RIght , absent on left     Assessment/Plan: 1. Functional deficits which require 3+ hours per day of interdisciplinary therapy in a comprehensive inpatient rehab setting. Physiatrist is providing close team supervision and 24 hour management of active medical problems listed below. Physiatrist and rehab team continue to assess barriers to discharge/monitor patient progress toward functional and medical goals   Care Tool:  Bathing    Body parts bathed by patient: Chest, Abdomen, Left arm, Face, Front perineal area, Right lower leg, Right upper leg, Left upper leg, Left lower leg, Right arm (used long sponge)   Body parts bathed by helper: Right arm, Buttocks Body parts n/a: Right arm, Buttocks, Left lower leg   Bathing assist Assist Level: Minimal Assistance - Patient > 75%     Upper Body Dressing/Undressing Upper body dressing   What is the patient wearing?: Pull over shirt    Upper body assist Assist Level: Minimal Assistance - Patient > 75%    Lower Body Dressing/Undressing Lower body dressing      What is the patient wearing?: Incontinence brief, Pants     Lower body assist Assist for lower body dressing: Moderate Assistance - Patient 50 - 74%     Toileting Toileting    Toileting assist Assist for toileting: Maximal Assistance - Patient 25 - 49%     Transfers Chair/bed transfer  Transfers assist  Chair/bed transfer assist level: Minimal Assistance - Patient > 75% (squat pivot)     Locomotion Ambulation   Ambulation assist   Ambulation activity did not occur: Safety/medical concerns          Walk 10 feet activity   Assist  Walk 10 feet activity did not occur: Safety/medical concerns        Walk 50 feet activity   Assist Walk 50 feet with 2 turns activity did not occur: Safety/medical concerns         Walk 150 feet activity   Assist Walk 150 feet activity did not occur: Safety/medical concerns         Walk 10 feet on uneven surface   activity   Assist Walk 10 feet on uneven surfaces activity did not occur: Safety/medical concerns         Wheelchair     Assist Will patient use wheelchair at discharge?:  (TBD)             Wheelchair 50 feet with 2 turns activity    Assist            Wheelchair 150 feet activity     Assist           Medical Problem List and Plan: 1.  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 Course complicated by seizure  Continue PT, OT and SLP  WHO/PRAFO nightly 2.  Impaired mobility -DVT/anticoagulation:  Pharmaceutical: Continue Lovenox             -antiplatelet therapy: N/A due to bleed 3.  Ongoing headaches/pain Management: Continue Depakote and Keppra.  Continue Topamax to 50mg  4. Mood: LCSW to follow for evaluation and support             -antipsychotic agents: N/AA 5. Neuropsych: This patient is intermittently capable of making decisions on his own behalf. 6. Skin/Wound Care: Routine pressure-relief measures. Eucerin ordered for dry skin. Recommended wife bring coconut oil from home.  7. Fluids/Electrolytes/Nutrition: Monitor intake/output. Discussed anti-inflammatory diet, replacing ginger ale with water during the day and tart cherry juice at night, cranberry juice if he really wants juice during the day. 9.  Partial complex seizure: Continue Keppra 500 mg twice daily. 10.  HTN: Monitor blood pressures 3 times daily.              -- Labile, continue amlodipine, labetalol, losartan and clonidine             -SBP goal of <160 given ICH  6/11-6/20- BP controlled- continue regimen Vitals:   07/11/20 1930 07/12/20 0533  BP: 135/80 132/76  Pulse: 84 80  Resp: 18 18  Temp: 98.1 F (36.7 C) 97.8 F (36.6 C)  SpO2: 96% 97%   11.  Dyslipidemia: Continue statin. 12.  Acute kidney injury:   Resolved  13.  History of alcohol abuse/delirium: Continue Seroquel 25 mg at bedtime to help  with sleep/wake cycle. Discussed this would not be long term             -- Continue folic acid and thiamine supplementation 14.  Acute blood loss anemia:   Hemoglobin 11.8 on 6/1, stable at 11.4  Continue to monitor 15.  Morbid obesity/OHS/OSA?: Outpatient sleep study recommended for work-up 16.  Post stroke dysphagia  D3 thins, advance diet as tolerated  6/11- tolerating diet- no issues- con't regimen 17. Right ankle pain: suspect sprain given tenderness over ATFL. Ice 15mg  TID. Given chronicity of pain  and pain with weightbearing, ordered right ankle XR- discussed with wife that there is no evidence of fracture or athropathy. Continue voltaren gel, d/c capsaicin. Requested RN soak foot in epsom salt if available. Ankle support brace ordered and patient wearing. Advised to elevate when resting, minimize weightbearing while with therapy to allow ligament to heal. Provided list of anti-inflammatory foods for pain. Discussed risks and benefits of various medications such as tylenol, NSAIDs, and prednisone and patient and wife decided on above conservative treatment and tylenol for now.    -improved.  18.  Urinary urgency but unable to void, suspect spastic bladder with BPH , start flomax, BP should support this med , as discussed with RN would check bladder scan as well  19. Left shoulder pain: continue lidocaine and voltaren gel. Advised wife to bring in blue emu oil at home. Discussed XR results: mild subluxation  20. Confusion in morning as per wife: monitor for worsening with restart of Gabapentin. 21. Low back pain, right side: continue heating pad and add lidocaine patch.   22. Left groin pain: restart Gabapentin 100mg  TID- may be more muscular, stays in ext rotation at hip due to LLE weakness 23. Left pectoralis pain: lidocaine injected 6/20. Discussed benefits of Botox injection.  24. Disposition: HFU scheduled with me on 09/17/20 1:00PM. FL2 form completed, wife has requested copy as well.     LOS: 20 days A FACE TO FACE EVALUATION WAS PERFORMED  09/19/20 Manette Doto 07/12/2020, 10:21 AM

## 2020-07-12 NOTE — Progress Notes (Addendum)
Physical Therapy Session Note  Patient Details  Name: [name] MRN: [id number] Date of Birth: 04/08/1965  Today's Date: 07/12/2020 PT Individual Time: 1300-1412 PT Individual Time Calculation (min): 72 min   Short Term Goals: Week 3:  PT Short Term Goal 1 (Week 3): Pt will complete bed mobility with no more than minA consistently PT Short Term Goal 2 (Week 3): Pt will complete sit<>stand transfers with no more than minA consistently with LRAD PT Short Term Goal 3 (Week 3): Pt will complete bed<>chair transfers with minA and LRAD PT Short Term Goal 4 (Week 3): Pt will ambulate 10ft with +2 modA PT Short Term Goal 5 (Week 3): Pt will propel himself 25ft with modA on level surfaces via hemi-technique  Skilled Therapeutic Interventions/Progress Updates:    Pt greeted seated in w/c to start PT tx - no reports of pain and ready to begin therapy. Wife called pt's cell phone and asked therapist to see if pt would be able to use a RW while standing in order to assist with dressing tasks. Wheeled with totalA to main rehab gym for time management.   Completed squat<>pivot transfer with minA (mod to max cues) towards stronger R side to mat table. Retrieved RW splint for LUE to assist with maintaining grasp to walker. Pt able to stand to RW with minA and L knee block - able to stand with minA but as soon as he shifts weight to his L he had x1 large LOB that was unable to be corrected resulting in abrupt sitting to mat table - concern for this happening at home with wife unable to provide physical A to correct LOB's, relayed concern to pt as we want to minimize his falls risk and pt is full agreement and understanding. Pt requesting something similar to the Sara Stedy for home use, informed of expensive price and off-brand options that are still expensive. Next, worked on standing balance with modA guard while rehab tech tossed ball for pt to tap back - pt needing modA for LLE knee stability and balance to  reduce L lateral lean. Pt with frequent minor LOB's, especially with ball tossed to his L side. Cues throughout for quad activation to reduce L knee buckling.   Next, focused on car transfers with car height set to simulate their Toyota Camry. Pt required modA for completing squat<>pivot transfer into the car and minA for completing squat<>pivot out of the car. Once in the car, he required modA for repositioning due to sacral sitting and leaning L. He requires maxA for managing his LLE in/out of the car.   Pt also worked on w/c propulsion (w/c cushion removed) where he propelled himself ~135ft with supervision on level ground, distance incorporated x1 turn to the R and x1 turn to the L through narrow doorway. With doorway, he required mod cues for problem solving how to take a wide turn to fit through the doorway (pt has narrow doorways at home so this is good practice for him).   He ended session seated in w/c with L 1/2 lap tray supporting LUE and safety belt alarm on, all needs met.  Therapy Documentation Precautions:  Precautions Precautions: Fall Precaution Comments: Lt hemi with right gaze preference Restrictions Weight Bearing Restrictions: No General:    Therapy/Group: Individual Therapy   P  07/12/2020, 7:43 AM  

## 2020-07-12 NOTE — Progress Notes (Signed)
Occupational Therapy Session Note  Patient Details  Name: [name] MRN: [id number] Date of Birth: 1965/10/26  Today's Date: 07/12/2020 OT Individual Time: 0930-1030 and 1130-1215 OT Individual Time Calculation (min): 60 min and 45 min   Short Term Goals: Week 3:  OT Short Term Goal 1 (Week 3): Pt will be able to don pants over his feet with min A. OT Short Term Goal 2 (Week 3): Pt will be able to complete pericare and clothing mgt during toileting with min A. OT Short Term Goal 3 (Week 3): Caregiver will assist pt with squat pivot functional transfers needing only min multimodal cues. OT Short Term Goal 4 (Week 3): Pt will be able to actively raise LUE to wash under arm with VCs only.  Skilled Therapeutic Interventions/Progress Updates:    Visit 1:  Pain: c/o L sh and groin paing, MD aware  pt received in bed with sheets wet as pt had tried to use urinal himself.  Reminded pt to call for help with managing urinal.  From bed, using rolls and bridges, removed soiled clothing and had pt self cleanse front area.  Pt then rolled to L and sat up with min A.  +2 with standing for safety to have pt's bottom cleansed well and to pull clothing over hips.   Pt then completed squat pivot to R with support through L arm and leg only.  In wc worked on oral care and UB self care.  Reapplied kinesio tape to L shoulder to support shoulder.   Pt resting in wc with all needs met and belt alarm on.     Visit 2: pain: pt reported his shoulder was feeling better after saline injection  Pt taken to gym and completed squat pivot to R with CGA of 1 person only. Sat on mat and worked on [employer] with a/arom using UE ranger, bilateral holds on dowel bar and wt bearing all with a focus on scapular retraction, elbow extension.  Pt has improved activation of his entire arm and is starting to lift against gravity.  Slight edema in hand, used coban for compression support. Also during transfers, used theraband  band around thighs to keep legs in neutral. Squat pivot back to L side to wc with min A but pt did lean back too quickly and landed too fast.  He was safe, but it was not as smooth as a transfer.  Did better on 2nd squat to move to left in wc more.  Pt with lap tray on for L arm support, taken back to room. Pt resting in wc with all needs met and belt alarm on.    Therapy Documentation Precautions:  Precautions Precautions: Fall Precaution Comments: Lt hemi with right gaze preference Restrictions Weight Bearing Restrictions: No    Vital Signs: Therapy Vitals Temp: 97.8 F (36.6 C) Pulse Rate: 80 Resp: 18 BP: 132/76 Patient Position (if appropriate): Sitting Oxygen Therapy SpO2: 97 % O2 Device: Room Air    ADL: ADL Eating: Set up Grooming: Setup Where Assessed-Grooming: Sitting at sink Upper Body Bathing: Supervision/safety Where Assessed-Upper Body Bathing: Shower Lower Body Bathing: Minimal assistance Where Assessed-Lower Body Bathing: Shower Upper Body Dressing: Minimal assistance Where Assessed-Upper Body Dressing: Sitting at sink Lower Body Dressing: Moderate assistance Where Assessed-Lower Body Dressing: Wheelchair Toileting: Moderate assistance Where Assessed-Toileting: Bedside Commode, Toilet Toilet Transfer: Minimal assistance Toilet Transfer Method: Squat pivot Toilet Transfer Equipment: Grab bars, Raised toilet seat Social research officer, government: Moderate assistance Social research officer, government Method:  Squat pivot Youth worker: Radio broadcast assistant, Grab bars   Therapy/Group: Individual Therapy  [place] 07/12/2020, 8:50 AM

## 2020-07-12 NOTE — Progress Notes (Signed)
Speech Language Pathology Daily Session Note  Patient Details  Name: [name] MRN: [id number] Date of Birth: 01-16-1966  Today's Date: 07/12/2020 SLP Individual Time: 1415-1500 SLP Individual Time Calculation (min): 45 min  Short Term Goals: Week 3: SLP Short Term Goal 1 (Week 3): Patient will consume current diet with minimal overt s/s of aspiration with mod I swallowing compensatory strategies. SLP Short Term Goal 2 (Week 3): Patient will recall new, daily information with supervision verbal and visual cues SLP Short Term Goal 3 (Week 3): Pt will demonstrate selective attention for 30-45 minutes in mildly distracting environments with supervision A verbal cues. SLP Short Term Goal 4 (Week 3): Pt will demonstrate mildly complex problem solving skills in functional tasks with min A verbal cues. SLP Short Term Goal 5 (Week 3): Pt will demonstrate self-monitoring and self-correction of errors in problem solving tasks with min A verbal cues.  Skilled Therapeutic Interventions: Skilled ST treatment performed with focus on cognitive goals. SLP facilitated functional short-term recall task with min A verbal cues for accuracy. Patient was able to recall 6/10 newly learned items after 6 minute delay, and 9/10 with semantic or category cues. Patient utilized categorization, and visualization to assist with recall with support from therapist. Patient demonstrates improving emergent and anticipatory awareness, as he discussed the challenges he will likely experience when he discharges, however he stated he only acknowledged these potential problems after a therapist directly told him. Patient was left in wheelchair with alarm activated and all needs within reach. Continue with current plan of care.     Pain Pain Assessment Pain Scale: 0-10 Pain Score: 0-No pain  Therapy/Group: Individual Therapy  Tamala Ser 07/12/2020, 4:02 PM

## 2020-07-13 ENCOUNTER — Inpatient Hospital Stay (HOSPITAL_COMMUNITY): Payer: 59

## 2020-07-13 NOTE — Patient Care Conference (Signed)
Inpatient RehabilitationTeam Conference and Plan of Care Update Date: 07/13/2020   Time: 13:06 PM    Patient Name: [name]      Medical Record Number: [id number]  Date of Birth: Jun 16, 1965 Sex: Male         Room/Bed: 4W18C/4W18C-01 Payor Info: Payor: [employer] / Plan: [employer] NAP / Product Type: *No Product type* /    Admit Date/Time:  06/22/2020  2:56 PM  Primary Diagnosis:  Thalamic hemorrhage [facility])  Hospital Problems: Principal Problem:   Thalamic hemorrhage (HCC) Active Problems:   Acute blood loss anemia   AKI (acute kidney injury) (HCC)   Essential hypertension   Seizures (HCC)   Vascular headache   Dysphagia, post-stroke   Left hemiplegia (HCC)   Cognitive deficit due to recent stroke    Expected Discharge Date: Expected Discharge Date: 07/21/20  Team Members Present: Physician leading conference: Dr. Sula Soda Care Coodinator Present: Chana Bode, RN, BSN, CRRN;Becky Dupree, LCSW Nurse Present: Chana Bode, RN PT Present: Wynelle Link, PT OT Present: Towanda Malkin, OT SLP Present: Colin Benton, SLP PPS Coordinator present : Edson Snowball, PT     Current Status/Progress Goal Weekly Team Focus  Bowel/Bladder             Swallow/Nutrition/ Hydration   tolerating regular diet and thin  mod I with reg diet and thin liquids  dc'd dysphagia goals due to meeting them   ADL's   CGA to min squat pivots, min UB self care, mod LB self care and toileting.  LUE motor movement developing, can lift arm slightly.  min assist  LUE NMR, balance, ADL training, pt/fam ed   Mobility   modA bed mobility, minA squat<>pivot transfers, minA w/c propulsion with hemi-technique  minA  pt education, w/c management and propulsion, LLE NMR, family education, safety awareness, functional transfers   Communication             Safety/Cognition/ Behavioral Observations  min -supervision A  Supervision A  selective and alternating attention, mildly complex prob solving and  error awareness   Pain             Skin               Discharge Planning:  Wife has been here and aware of his 24/7 care needs at [place]. Plans to take off one monthe from work and will go from there.   Team Discussion: Left peck pain addressed with Botox injection. Note left groin pain and back pain. MD added lidocaine patch for back. MD discussed outpatient botox injections in future as needed. Active movement in left side; appears injection was helpful. Discussion of wife's concern about amount of care required and FL2 completed although SNF not covered and medicaid application is pending. Staff note patient will not be able to access the bathroom at his home. Recommend neuropsych consult for coping.  Patient on target to meet rehab goals: Currently min assist for w/c propulsion with cues for navigating environmental obstacles due to visual deficits  *See Care Plan and progress notes for long and short-term goals.   Revisions to Treatment Plan:  Working on functional transfers and lowe extremity strengthening  Teaching Needs: Transfers, toileting, medications, secondary stroke risk management, etc.  Current Barriers to Discharge: Decreased caregiver support, Home enviroment access/layout, and Insurance for SNF coverage  Possible Resolutions to Barriers: Family education     Medical Summary Current Status: vitals stable, right sided low back pain, left pec tightness, left groin pain,  right ankle pain  Barriers to Discharge: Medical stability  Barriers to Discharge Comments: right sided low back pain, left pec tightness, left groin pain, right ankle pain Possible Resolutions to Barriers/Weekly Focus: increased gabapentin, lidocaine injected to left pec, continue ice, continue bracing to left ankle, sheduled for botox to left pec outpatient   Continued Need for Acute Rehabilitation Level of Care: The patient requires daily medical management by a physician with specialized training in  physical medicine and rehabilitation for the following reasons: Direction of a multidisciplinary physical rehabilitation program to maximize functional independence : Yes Medical management of patient stability for increased activity during participation in an intensive rehabilitation regime.: Yes Analysis of laboratory values and/or radiology reports with any subsequent need for medication adjustment and/or medical intervention. : Yes   I attest that I was present, lead the team conference, and concur with the assessment and plan of the team.   Chana Bode B 07/13/2020, 5:30 PM

## 2020-07-13 NOTE — Progress Notes (Signed)
Speech Language Pathology Weekly Progress and Session Note  Patient Details  Name: [name] MRN: [id number] Date of Birth: 1965-09-11  Beginning of progress report period: July 06, 2020 End of progress report period: July 13, 2020  Today's Date: 07/13/2020 SLP Individual Time: 3016-0109 SLP Individual Time Calculation (min): 40 min  Short Term Goals: Week 3: SLP Short Term Goal 1 (Week 3): Patient will consume current diet with minimal overt s/s of aspiration with mod I swallowing compensatory strategies. SLP Short Term Goal 1 - Progress (Week 3): Met SLP Short Term Goal 2 (Week 3): Patient will recall new, daily information with supervision verbal and visual cues SLP Short Term Goal 2 - Progress (Week 3): Progressing toward goal SLP Short Term Goal 3 (Week 3): Pt will demonstrate selective attention for 30-45 minutes in mildly distracting environments with supervision A verbal cues. SLP Short Term Goal 3 - Progress (Week 3): Progressing toward goal SLP Short Term Goal 4 (Week 3): Pt will demonstrate mildly complex problem solving skills in functional tasks with min A verbal cues. SLP Short Term Goal 4 - Progress (Week 3): Updated due to goal met SLP Short Term Goal 5 (Week 3): Pt will demonstrate self-monitoring and self-correction of errors in problem solving tasks with min A verbal cues. SLP Short Term Goal 5 - Progress (Week 3): Met    New Short Term Goals: Week 4: SLP Short Term Goal 1 (Week 4): Pt will demonstrate selective attention for 30-45 minutes in mildly distracting environments with supervision A verbal cues. SLP Short Term Goal 2 (Week 4): Pt will demonstrate mildly complex problem solving skills in functional tasks with supervision A verbal cues. SLP Short Term Goal 3 (Week 4): Pt will demonstrate self-monitoring and self-correction of errors in problem solving tasks with Supervision A verbal cues. SLP Short Term Goal 4 (Week 4): Patient will recall new, daily  information with supervision verbal and visual cues  Weekly Progress Updates: Pt has met 2/5 STG's this reporting period due to improved tolerance with regular solids, improved problem solving, and improved self correction of errors with less cueing required. He continues to require supervision A for attention to details and error awareness with problem solving tasks. He demonstrates improved recall of medications and changes in medications. Pt education is ongoing he will benefit from continued skilled SLP intervention.      Intensity: Minumum of 1-2 x/day, 30 to 90 minutes Frequency: 3 to 5 out of 7 days Duration/Length of Stay: 6/29 Treatment/Interventions: Cognitive remediation/compensation;Internal/external aids;Dysphagia/aspiration precaution training;Therapeutic Activities;Environmental controls;Cueing hierarchy;Functional tasks;Patient/family education   Daily Session  Skilled Therapeutic Interventions: Pt completed immediate recall task with 5-6 sentence paragraphs with min A verbal cues. Higher level deductive reasoning puzzle completed with min A verbal and visual cues for process of elimination with the available information. Pt left seated upright in bed with bed alarm set and call button within reach. Cont with therapy per plan of care.    General    Pain Pain Assessment Pain Scale: Faces Faces Pain Scale: No hurt  Therapy/Group: Individual Therapy  Darrol Poke Mackinsey Pelland 07/13/2020, 11:41 AM

## 2020-07-13 NOTE — Progress Notes (Signed)
Wife called for update and provided information

## 2020-07-13 NOTE — Progress Notes (Signed)
Physical Therapy Session Note  Patient Details  Name: [name] MRN: [id number] Date of Birth: 1965-03-17  Today's Date: 07/13/2020 PT Individual Time: 2671-2458 PT Individual Time Calculation (min): 30 min   Short Term Goals: Week 3:  PT Short Term Goal 1 (Week 3): Pt will complete bed mobility with no more than minA consistently PT Short Term Goal 2 (Week 3): Pt will complete sit<>stand transfers with no more than minA consistently with LRAD PT Short Term Goal 3 (Week 3): Pt will complete bed<>chair transfers with minA and LRAD PT Short Term Goal 4 (Week 3): Pt will ambulate 80ft with +2 modA PT Short Term Goal 5 (Week 3): Pt will propel himself 56ft with modA on level surfaces via hemi-technique  Skilled Therapeutic Interventions/Progress Updates: Pt presents sitting in recliner and agreeable to therapy, but states pain/soreness from earlier therapy session.  Pt performed seated reaching forward and outside of BOS, crossing midline RUE only to improve stretching of LB.  Pt performed hemi pattern cross-body chop/flexion as well as overhead and then throwing ball overhead w/ RUE only.  All performed w/o trunk support.  Pt states some discomfort to R paraspinals, but tolerate.  Pt performed multiple transitions seated in recliner to front of recliner and then back, w/ verbal cues for attention to placement of LLE and for clearing of buttocks from seat.  Pt transferred sit to stand w/ min A and min blocking of L knee/LE (tends to ER).  Pt stood x 1' but c/o soreness LB.  Ice pack applied to LB and remained in recliner per pt preference.  Seat alarm on and all needs in reach.  RN notified of ice pack use.     Therapy Documentation Precautions:  Precautions Precautions: Fall Precaution Comments: Lt hemi with right gaze preference Restrictions Weight Bearing Restrictions: No General:   Vital Signs:   Pain:pt c/o pain to R low back but not quantifying. Pain Assessment Pain Scale:  Faces Faces Pain Scale: No hurt Mobility:       Therapy/Group: Individual Therapy  [name] 07/13/2020, 2:49 PM

## 2020-07-13 NOTE — Progress Notes (Signed)
Occupational Therapy Session Note  Patient Details  Name: [name] MRN: [id number] Date of Birth: 1965-07-28  Today's Date: 07/13/2020 OT Individual Time: 1105-1200 OT Individual Time Calculation (min): 55 min    Short Term Goals: Week 3:  OT Short Term Goal 1 (Week 3): Pt will be able to don pants over his feet with min A. OT Short Term Goal 2 (Week 3): Pt will be able to complete pericare and clothing mgt during toileting with min A. OT Short Term Goal 3 (Week 3): Caregiver will assist pt with squat pivot functional transfers needing only min multimodal cues. OT Short Term Goal 4 (Week 3): Pt will be able to actively raise LUE to wash under arm with VCs only.  Skilled Therapeutic Interventions/Progress Updates:    Pt received in recliner reporting new back pain.  RN aware and pt with ice on his back. Pt not able to move out of recliner at this time so initiated estim to facilitate LUE.  Used 330 pulse width at 35 Hz pulse rate with symmetrical biphase wave form for 12 minutes on forearm for wrist and finger extension with therapist assisting with increased ROM.  Increased hand edema today, worked on retrograde massage and reviewed positioning with pt. Applied wrist splint after estim.  Applied same parameters of estim to L tricep to work on active reach for 12 min.    Placed tray table on L side of recliner and had pt work on a/arom of sh flex and ext, elbow flex and ext and finger flexion with towel slides.    Pt participated well despite having pain in low back.  Resting in recliner with all needs met.    Therapy Documentation Precautions:  Precautions Precautions: Fall Precaution Comments: Lt hemi with right gaze preference Restrictions Weight Bearing Restrictions: No    Vital Signs: Therapy Vitals Temp: 97.9 F (36.6 C) Temp Source: Oral Pulse Rate: 82 Resp: 18 BP: 129/77 Patient Position (if appropriate): Lying Oxygen Therapy SpO2: 100 % O2 Device: Room  Air Pain: Pain Assessment Pain Scale: 0-10 Pain Score: 0-No pain ADL: ADL Eating: Set up Grooming: Setup Where Assessed-Grooming: Sitting at sink Upper Body Bathing: Supervision/safety Where Assessed-Upper Body Bathing: Shower Lower Body Bathing: Minimal assistance Where Assessed-Lower Body Bathing: Shower Upper Body Dressing: Minimal assistance Where Assessed-Upper Body Dressing: Sitting at sink Lower Body Dressing: Moderate assistance Where Assessed-Lower Body Dressing: Wheelchair Toileting: Moderate assistance Where Assessed-Toileting: Bedside Commode, Toilet Toilet Transfer: Minimal assistance Toilet Transfer Method: Squat pivot Toilet Transfer Equipment: Grab bars, Raised toilet seat Social research officer, government: Moderate assistance Social research officer, government Method: Education officer, environmental: Radio broadcast assistant, Grab bars  Therapy/Group: Individual Therapy  Mechele Kittleson 07/13/2020, 8:27 AM

## 2020-07-13 NOTE — Plan of Care (Signed)
  Problem: RH Ambulation Goal: LTG Patient will ambulate in controlled environment (PT) Description: LTG: Patient will ambulate in a controlled environment, # of feet with assistance (PT). Outcome: Not Applicable Note: Pt will not be a safe functional ambulator at time of DC. Added w/c goals Goal: LTG Patient will ambulate in home environment (PT) Description: LTG: Patient will ambulate in home environment, # of feet with assistance (PT). Outcome: Not Applicable   Problem: RH Wheelchair Mobility Goal: LTG Patient will propel w/c in controlled environment (PT) Description: LTG: Patient will propel wheelchair in controlled environment, # of feet with assist (PT) Flowsheets (Taken 07/13/2020 1252) LTG: Pt will propel w/c in controlled environ  assist needed:: Supervision/Verbal cueing LTG: Propel w/c distance in controlled environment: 128ft

## 2020-07-13 NOTE — Progress Notes (Signed)
Physical Therapy Session Note  Patient Details  Name: [name] MRN: [id number] Date of Birth: 07/07/1965  Today's Date: 07/13/2020 PT Individual Time: 0900-0958 PT Individual Time Calculation (min): 58 min   Short Term Goals: Week 3:  PT Short Term Goal 1 (Week 3): Pt will complete bed mobility with no more than minA consistently PT Short Term Goal 2 (Week 3): Pt will complete sit<>stand transfers with no more than minA consistently with LRAD PT Short Term Goal 3 (Week 3): Pt will complete bed<>chair transfers with minA and LRAD PT Short Term Goal 4 (Week 3): Pt will ambulate 48f with +2 modA PT Short Term Goal 5 (Week 3): Pt will propel himself 265fwith modA on level surfaces via hemi-technique  Skilled Therapeutic Interventions/Progress Updates:    Pt greeted supine in bed to start session - agreeable to PT tx. No reports of pain. Removed PRAFO to L foot with totalA although foot noted to be in external rotation "frog leg" position - encouraged him to monitor leg position in the bed and reach out to nursing to assist if needed. Pt completed supine<>sit with minA with use of hospital bed features. Donned tennis shoes with totalA for time management. He reported urge to urinate - due to urgency, used SaDenna Haggardo transfer him to the [facility] was in bathroom over the toilet, stedy transfer with CGA but cues for L foot/knee awareness. Pt continent of bladder, with time, on toilet. He required minA for standing balance while managing lower body dressing in standing in St[place]Pt then wheeled in StFederal Wayith him sitting in perched position to the sink where he washed hands with CGA for balance and maxA for LUE management. Removed w/c cushion from w/c (to allow self propulsion) and pt transferred to w/c with minA with use of Stedy. Pt propelled himself ~12521from his room to main rehab gym, using hemi technique with R hemibody in w/c, cues for keeping straight path and wide turns through  doorways. Focused remainder of session on blocked practice transfer training - Pt completed several bouts of squat<>pivot transfers from w/c<>mat table, assist level initially requiring modA, progressing to minA. Max cues needed for setup and forward weight shift. Wife reached out to this therapist and requested to attempt sliding board transfers - introduced slide board to pt and instructed pt on use/purpose. He required modA for sliding board transfers to both directions, despite repetitions and cues. At this point, pt began to c/o L hip flexor pain, rated 5/10. He was returned to his room and remained seated in w/c at end of session with 1/2 lap tray supporting LUE and safety belt alarm on - all needs met.   Therapy Documentation Precautions:  Precautions Precautions: Fall Precaution Comments: Lt hemi with right gaze preference Restrictions Weight Bearing Restrictions: No General:    Therapy/Group: Individual Therapy  [name] [name] PT 07/13/2020, 7:36 AM

## 2020-07-13 NOTE — Progress Notes (Signed)
[place] PHYSICAL MEDICINE & REHABILITATION PROGRESS NOTE  Subjective/Complaints: C/o right sided low back pain this morning and requests lidocaine patch- was not applied- asked RN to apply during therapy session Left pec feeling better Vitals stable  ROS:   Pt denies SOB, abd pain, CP, N/V/C/D, and vision changes, +left pec pain, right ankle pain has resolved, +right sided lower back pain, +left sided weakness.    Objective: Vital Signs: Blood pressure 129/77, pulse 82, temperature 97.9 F (36.6 C), temperature source Oral, resp. rate 18, height 5\' 10"  (1.778 m), weight 105.4 kg, SpO2 100 %. No results found. Recent Labs    07/12/20 0616  WBC 5.2  HGB 11.2*  HCT 35.6*  PLT 267     Recent Labs    07/12/20 0616  NA 140  K 3.9  CL 105  CO2 26  GLUCOSE 97  BUN 16  CREATININE 1.24  CALCIUM 9.7      Intake/Output Summary (Last 24 hours) at 07/13/2020 1240 Last data filed at 07/13/2020 07/15/2020 Gross per 24 hour  Intake 798 ml  Output 1000 ml  Net -202 ml        Physical Exam: BP 129/77 (BP Location: Right Arm)   Pulse 82   Temp 97.9 F (36.6 C) (Oral)   Resp 18   Ht 5\' 10"  (1.778 m)   Wt 105.4 kg   SpO2 100%   BMI 33.34 kg/m   Gen: no distress, normal appearing HEENT: oral mucosa pink and moist, NCAT Cardio: Reg rate Chest: normal effort, normal rate of breathing Abd: soft, non-distended Ext: no edema Psych: pleasant, normal affect Skin: intact Neuro: Cerebellar exam normal finger to nose to finger as well as heel to shin in bilateral upper and lower extremities Musculoskeletal: Full range of motion in all 4 extremities. No joint swelling- L shoulder and L hand have Ktaping on them- tenderness at left hip add origin Left pec tender to palpation Dysarthria, stable Motor: LUE: 0/5 proximal to distal LLE: HF, KE 2-/5, ADF 1/5, unchanged Left inattention RUE/RLE: 5/5 proximal distal Sensation intact LT on RIght , absent on left     Assessment/Plan: 1. Functional deficits which require 3+ hours per day of interdisciplinary therapy in a comprehensive inpatient rehab setting. Physiatrist is providing close team supervision and 24 hour management of active medical problems listed below. Physiatrist and rehab team continue to assess barriers to discharge/monitor patient progress toward functional and medical goals   Care Tool:  Bathing    Body parts bathed by patient: Chest, Abdomen, Left arm, Face, Front perineal area, Right lower leg, Right upper leg, Left upper leg, Left lower leg, Right arm (used long sponge)   Body parts bathed by helper: Right arm, Buttocks Body parts n/a: Right arm, Buttocks, Left lower leg   Bathing assist Assist Level: Minimal Assistance - Patient > 75%     Upper Body Dressing/Undressing Upper body dressing   What is the patient wearing?: Pull over shirt    Upper body assist Assist Level: Minimal Assistance - Patient > 75%    Lower Body Dressing/Undressing Lower body dressing      What is the patient wearing?: Incontinence brief, Pants     Lower body assist Assist for lower body dressing: Moderate Assistance - Patient 50 - 74%     Toileting Toileting    Toileting assist Assist for toileting: Maximal Assistance - Patient 25 - 49%     Transfers Chair/bed transfer  Transfers assist  Chair/bed transfer assist level: Minimal Assistance - Patient > 75% (squat pivot)     Locomotion Ambulation   Ambulation assist   Ambulation activity did not occur: Safety/medical concerns          Walk 10 feet activity   Assist  Walk 10 feet activity did not occur: Safety/medical concerns        Walk 50 feet activity   Assist Walk 50 feet with 2 turns activity did not occur: Safety/medical concerns         Walk 150 feet activity   Assist Walk 150 feet activity did not occur: Safety/medical concerns         Walk 10 feet on uneven surface   activity   Assist Walk 10 feet on uneven surfaces activity did not occur: Safety/medical concerns         Wheelchair     Assist Will patient use wheelchair at discharge?:  (TBD)             Wheelchair 50 feet with 2 turns activity    Assist            Wheelchair 150 feet activity     Assist           Medical Problem List and Plan: 1.  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 Course complicated by seizure  Continue PT, OT and SLP  WHO/PRAFO nightly  -Interdisciplinary Team Conference today   2.  Impaired mobility -DVT/anticoagulation:  Pharmaceutical: Continue Lovenox             -antiplatelet therapy: N/A due to bleed 3.  Ongoing headaches/pain Management: Continue Depakote and Keppra.  Continue Topamax to 50mg  4. Mood: LCSW to follow for evaluation and support             -antipsychotic agents: N/AA 5. Neuropsych: This patient is intermittently capable of making decisions on his own behalf. 6. Skin/Wound Care: Routine pressure-relief measures. Eucerin ordered for dry skin. Recommended wife bring coconut oil from home.  7. Fluids/Electrolytes/Nutrition: Monitor intake/output. Discussed anti-inflammatory diet, replacing ginger ale with water during the day and tart cherry juice at night, cranberry juice if he really wants juice during the day. 9.  Partial complex seizure: Continue Keppra 500 mg twice daily. 10.  HTN: Monitor blood pressures 3 times daily.              -- Labile, continue amlodipine, labetalol, losartan and clonidine             -SBP goal of <160 given ICH  6/11-6/20- BP controlled- continue regimen Vitals:   07/12/20 1934 07/13/20 0430  BP: 135/86 129/77  Pulse: 84 82  Resp: 18 18  Temp: 98.2 F (36.8 C) 97.9 F (36.6 C)  SpO2: 100% 100%   11.  Dyslipidemia: Continue statin. 12.  Acute kidney injury:   Resolved  13.  History of alcohol abuse/delirium:  Continue Seroquel 25 mg at bedtime to help with sleep/wake cycle. Discussed this would not be long term             -- Continue folic acid and thiamine supplementation 14.  Acute blood loss anemia:   Hemoglobin 11.8 on 6/1, stable at 11.4  Continue to monitor 15.  Morbid obesity/OHS/OSA?: Outpatient sleep study recommended for work-up 16.  Post stroke dysphagia  D3 thins, advance diet as tolerated  6/11- tolerating diet- no issues- con't regimen 17. Right ankle pain: suspect sprain given tenderness over ATFL.  Ice 15mg  TID. Given chronicity of pain and pain with weightbearing, ordered right ankle XR- discussed with wife that there is no evidence of fracture or athropathy. Continue voltaren gel, d/c capsaicin. Requested RN soak foot in epsom salt if available. Ankle support brace ordered and patient wearing. Advised to elevate when resting, minimize weightbearing while with therapy to allow ligament to heal. Provided list of anti-inflammatory foods for pain. Discussed risks and benefits of various medications such as tylenol, NSAIDs, and prednisone and patient and wife decided on above conservative treatment and tylenol for now.    -improved.  18.  Urinary urgency but unable to void, suspect spastic bladder with BPH , start flomax, BP should support this med , as discussed with RN would check bladder scan as well  19. Left shoulder pain: continue lidocaine and voltaren gel. Advised wife to bring in blue emu oil at home. Discussed XR results: mild subluxation  20. Confusion in morning as per wife: monitor for worsening with restart of Gabapentin. 21. Low back pain, right side: continue heating pad and add lidocaine patch.   22. Left groin pain: restart Gabapentin 100mg  TID- may be more muscular, stays in ext rotation at hip due to LLE weakness 23. Left pectoralis pain: lidocaine injected 6/20. Discussed benefits of Botox injection. Will discuss with wife for outpatient appointment in August. 24.  Disposition: HFU scheduled with me on 09/17/20 1:00PM. FL2 form completed, wife has requested copy as well.    LOS: 21 days A FACE TO FACE EVALUATION WAS PERFORMED  01[birth year]03-1998 [name] 07/13/2020, 12:40 PM

## 2020-07-13 NOTE — Progress Notes (Signed)
Patient ID: [name], male   DOB: May 27, 1965, 55 y.o.   MRN: [id number]  Spoke with wife to discuss team conference progress this week in therapies and the need to schedule family education next week prior to discharge home. She will let me know Friday.

## 2020-07-14 ENCOUNTER — Inpatient Hospital Stay (HOSPITAL_COMMUNITY): Payer: 59

## 2020-07-14 NOTE — Progress Notes (Signed)
[place] PHYSICAL MEDICINE & REHABILITATION PROGRESS NOTE  Subjective/Complaints: Pain is limiting her therapy. Discussed using tramadol, ordered, placed nursing order to administer one dose now Systolic BP is slightly elevated. Discussed with him getting image of lower back and left hip as well today  ROS:   Pt denies SOB, abd pain, CP, N/V/C/D, and vision changes, +left pec pain, right ankle pain has resolved, +right sided lower back pain- this is the worst pain today, +left sided weakness.    Objective: Vital Signs: Blood pressure (!) 142/73, pulse 73, temperature 99.4 F (37.4 C), temperature source Oral, resp. rate 18, height 5\' 10"  (1.778 m), weight 105.4 kg, SpO2 100 %. DG HIP UNILAT WITH PELVIS 2-3 VIEWS RIGHT  Result Date: 07/13/2020 CLINICAL DATA:  Right hip pain EXAM: DG HIP (WITH OR WITHOUT PELVIS) 3V RIGHT COMPARISON:  None. FINDINGS: Pelvic ring is intact. Degenerative changes of the hip joints are noted. No acute fracture or dislocation is noted. No soft tissue abnormality is seen. IMPRESSION: Degenerative change without acute abnormality. Electronically Signed   By: 07/15/2020 M.D.   On: 07/13/2020 17:44   Recent Labs    07/12/20 0616  WBC 5.2  HGB 11.2*  HCT 35.6*  PLT 267     Recent Labs    07/12/20 0616  NA 140  K 3.9  CL 105  CO2 26  GLUCOSE 97  BUN 16  CREATININE 1.24  CALCIUM 9.7      Intake/Output Summary (Last 24 hours) at 07/14/2020 1028 Last data filed at 07/14/2020 0700 Gross per 24 hour  Intake 320 ml  Output 450 ml  Net -130 ml        Physical Exam: BP (!) 142/73 (BP Location: Right Arm)   Pulse 73   Temp 99.4 F (37.4 C) (Oral)   Resp 18   Ht 5\' 10"  (1.778 m)   Wt 105.4 kg   SpO2 100%   BMI 33.34 kg/m   Gen: no distress, normal appearing HEENT: oral mucosa pink and moist, NCAT Cardio: Reg rate Chest: normal effort, normal rate of breathing Abd: soft, non-distended Ext: no edema Psych: pleasant, normal  affect Neuro: Cerebellar exam normal finger to nose to finger as well as heel to shin in bilateral upper and lower extremities Musculoskeletal: Full range of motion in all 4 extremities. No joint swelling- L shoulder and L hand have Ktaping on them- tenderness at left hip add origin Left pec tender to palpation Dysarthria, stable Motor: LUE: 0/5 proximal to distal LLE: HF, KE 2-/5, ADF 1/5, unchanged Left inattention RUE/RLE: 5/5 proximal distal Sensation intact LT on RIght , absent on left    Assessment/Plan: 1. Functional deficits which require 3+ hours per day of interdisciplinary therapy in a comprehensive inpatient rehab setting. Physiatrist is providing close team supervision and 24 hour management of active medical problems listed below. Physiatrist and rehab team continue to assess barriers to discharge/monitor patient progress toward functional and medical goals   Care Tool:  Bathing    Body parts bathed by patient: Chest, Abdomen, Left arm, Face, Front perineal area, Right lower leg, Right upper leg, Left upper leg, Left lower leg, Right arm (used long sponge)   Body parts bathed by helper: Right arm, Buttocks Body parts n/a: Right arm, Buttocks, Left lower leg   Bathing assist Assist Level: Minimal Assistance - Patient > 75%     Upper Body Dressing/Undressing Upper body dressing   What is the patient wearing?: Pull over shirt  Upper body assist Assist Level: Minimal Assistance - Patient > 75%    Lower Body Dressing/Undressing Lower body dressing      What is the patient wearing?: Incontinence brief, Pants     Lower body assist Assist for lower body dressing: Moderate Assistance - Patient 50 - 74%     Toileting Toileting    Toileting assist Assist for toileting: Maximal Assistance - Patient 25 - 49%     Transfers Chair/bed transfer  Transfers assist     Chair/bed transfer assist level: Minimal Assistance - Patient > 75% (squat pivot)      Locomotion Ambulation   Ambulation assist   Ambulation activity did not occur: Safety/medical concerns          Walk 10 feet activity   Assist  Walk 10 feet activity did not occur: Safety/medical concerns        Walk 50 feet activity   Assist Walk 50 feet with 2 turns activity did not occur: Safety/medical concerns         Walk 150 feet activity   Assist Walk 150 feet activity did not occur: Safety/medical concerns         Walk 10 feet on uneven surface  activity   Assist Walk 10 feet on uneven surfaces activity did not occur: Safety/medical concerns         Wheelchair     Assist Will patient use wheelchair at discharge?: Yes Type of Wheelchair: Manual    Wheelchair assist level: Minimal Assistance - Patient > 75% Max wheelchair distance: 152ft    Wheelchair 50 feet with 2 turns activity    Assist        Assist Level: Minimal Assistance - Patient > 75%   Wheelchair 150 feet activity     Assist      Assist Level: Minimal Assistance - Patient > 75%    Medical Problem List and Plan: 1.  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 Course complicated by seizure  Continue PT, OT and SLP  WHO/PRAFO nightly 2.  Impaired mobility -DVT/anticoagulation:  Pharmaceutical: Continue Lovenox             -antiplatelet therapy: N/A due to bleed 3.  Ongoing headaches/pain Management: Continue Depakote and Keppra.  Continue Topamax to 50mg  4. Mood: LCSW to follow for evaluation and support             -antipsychotic agents: N/AA 5. Neuropsych: This patient is intermittently capable of making decisions on his own behalf. 6. Skin/Wound Care: Routine pressure-relief measures. Eucerin ordered for dry skin. Recommended wife bring coconut oil from home.  7. Fluids/Electrolytes/Nutrition: Monitor intake/output. Discussed anti-inflammatory diet, replacing ginger ale with water  during the day and tart cherry juice at night, cranberry juice if he really wants juice during the day. 9.  Partial complex seizure: Continue Keppra 500 mg twice daily. 10.  HTN: Monitor blood pressures 3 times daily.              -- Labile, continue amlodipine, labetalol, losartan and clonidine             -SBP goal of <160 given ICH  6/11-6/20- BP controlled- continue regimen Vitals:   07/13/20 1947 07/14/20 0503  BP: (!) 145/80 (!) 142/73  Pulse: 79 73  Resp: 16 18  Temp: 98 F (36.7 C) 99.4 F (37.4 C)  SpO2: 100% 100%   11.  Dyslipidemia: Continue statin. 12.  Acute kidney injury:  Resolved  13.  History of alcohol abuse/delirium: Continue Seroquel 25 mg at bedtime to help with sleep/wake cycle. Discussed this would not be long term             -- Continue folic acid and thiamine supplementation 14.  Acute blood loss anemia:   Hemoglobin 11.8 on 6/1, stable at 11.4  Continue to monitor 15.  Morbid obesity/OHS/OSA?: Outpatient sleep study recommended for work-up 16.  Post stroke dysphagia  D3 thins, advance diet as tolerated  6/22- tolerating diet- no issues- continue regimen 17. Right ankle pain: suspect sprain given tenderness over ATFL. Ice 15mg  TID. Given chronicity of pain and pain with weightbearing, ordered right ankle XR- discussed with wife that there is no evidence of fracture or athropathy. Continue voltaren gel, d/c capsaicin. Requested RN soak foot in epsom salt if available. Ankle support brace ordered and patient wearing. Advised to elevate when resting, minimize weightbearing while with therapy to allow ligament to heal. Provided list of anti-inflammatory foods for pain. Discussed risks and benefits of various medications such as tylenol, NSAIDs, and prednisone and patient and wife decided on above conservative treatment and tylenol for now.    -improved.  18.  Urinary urgency but unable to void, suspect spastic bladder with BPH , start flomax, BP should support  this med , as discussed with RN would check bladder scan as well  19. Left shoulder pain: continue lidocaine and voltaren gel. Advised wife to bring in blue emu oil at home. Discussed XR results: mild subluxation  20. Confusion in morning as per wife: monitor for worsening with restart of Gabapentin and tramadol.  21. Low back pain, right side: continue heating pad and add lidocaine patch. XR ordered. Tramadol ordered. Continue lidocaine patch.  22. Left groin pain: restart Gabapentin 100mg  TID- may be more muscular, stays in ext rotation at hip due to LLE weakness. XR or hip ordered.  23. Left pectoralis pain: lidocaine injected 6/20. Discussed benefits of Botox injection. Will discuss with wife for outpatient appointment in August. Improved.  24. Disposition: HFU scheduled with me on 09/17/20 1:00PM. FL2 form completed, wife has requested copy as well.    LOS: 22 days A FACE TO FACE EVALUATION WAS PERFORMED  September Brayton Baumgartner 07/14/2020, 10:28 AM

## 2020-07-14 NOTE — Progress Notes (Signed)
Patient ID: [name], male   DOB: [birth year], 55 y.o.   MRN: [id number]  Wife plans to come on Monday form 9-12 for education have also scheduled for Tuesday in case more education is needed prior to discharge Wed 6/29.

## 2020-07-14 NOTE — Progress Notes (Signed)
Physical Therapy Session Note  Patient Details  Name: [name] MRN: [id number] Date of Birth: April 27, 1965  Today's Date: 07/14/2020 PT Individual Time: 1300-1345 PT Individual Time Calculation (min): 45 min   Short Term Goals: Week 3:  PT Short Term Goal 1 (Week 4): STG = LTG due to ELOS  Skilled Therapeutic Interventions/Progress Updates:    Patient in supine and just finished lunch.  Did not realize he was wet but did state he had recently used the bathroom.  Patient mod A for changing brief and shorts in bed and min A for perineal hygiene.  Completed therex in supine consisting of lateral trunk rotation x 10 for loosening lumbar spine, L LE stretching to hamstring, piriformis and hip extensors and ankle plantarflexors with 20-30 sec holds x 2 reps each.  Patient supine to sit with min A and increased time through L sidelying.  Donned shoes with max A seated EOB for time management  Transfer to w/c via squat pivot to L with min A. Patient attempted w/c propulsion, but difficult due to cushion still in chair.  Patient sit to stand at rail in hallway to remove cushion, then able to propel with hemi-technique with S x 130' to general gym.  Patient sit to stand to RW at hi-lo table to play checkers reaching with R hand to move pieces with assist for L UE support on table.  PAtient quickly reported increased pain from 5-7/10 on R side of lower back.  Seated in w/c for stretch to R hip hamstrings, piriformis and SKTC stretch with A x 20-30 sec.  Sit to stand to RW again and stood about 20 seconds piror to needing to sit due to pain.  Propelled back to room in w/c with S.  Transporter arrived to take pt to imaging via w/c.     Therapy Documentation Precautions:  Precautions Precautions: Fall Precaution Comments: Lt hemi with right gaze preference Restrictions Weight Bearing Restrictions: No  Pain: Pain Assessment Pain Score: 7  Pain Type: Acute pain Pain Location: Back Pain Orientation:  Right;Lower Pain Descriptors / Indicators: Sharp;Aching Pain Onset: With Activity Pain Intervention(s): Repositioned;Rest;Other (Comment) (stretching)    Therapy/Group: Individual Therapy  [name], [place] 07/14/2020, 5:42 PM

## 2020-07-14 NOTE — Progress Notes (Signed)
Occupational Therapy Session Note  Patient Details  Name: [name] MRN: [id number] Date of Birth: 08-02-1965  Today's Date: 07/14/2020 OT Individual Time: 3976-7341 OT Individual Time Calculation (min): 88 min    Short Term Goals: Week 3:  OT Short Term Goal 1 (Week 3): Pt will be able to don pants over his feet with min A. OT Short Term Goal 2 (Week 3): Pt will be able to complete pericare and clothing mgt during toileting with min A. OT Short Term Goal 3 (Week 3): Caregiver will assist pt with squat pivot functional transfers needing only min multimodal cues. OT Short Term Goal 4 (Week 3): Pt will be able to actively raise LUE to wash under arm with VCs only.  Skilled Therapeutic Interventions/Progress Updates:    Pt received in bed c/o significant pain in back and groin. Offered pt a shower, but he said he was hurting too much and even getting out of bed would be too difficult. Pt agreeable to ROM, stretching, and deep trigger point massage. From supine: -L hip external rotation stretching  -bent L knee spinal twist to R (very limited range to back and L hip) -bent R knee spinal twist to L - double knee rotation side to side slowly gradual increase in ROM -L arm external rotation ROM -deep pressure to trigger points on R low back with pt in sidelying  LUE NMR: -a/arom of LUE with tricep extensions with arm against body, out to side at 90 degrees abduction, and overhead -a/arom of ext rot with arm in neutral at side -a/arom sh abduction from neutral to 90 degrees -elbow flexion  Finger flexion and ext  Pt continues to have edema in hand, used lotion for retrograde massage but unable to reduce edema significantly.  PROM to hand.  Told pt I would grab some washcloths to wash lotion off his hand to apply kinesiotape for edema reduction.  When I was grabbing washcloths from bathroom, pt grabbed the bed rail and started to pull himself up to sitting to EOB.  Stopped pt  halfway through.  Asked pt what he was doing. "I thought you wanted me to wash my hands at the sink so I just started to get up"  Reminded pt that he is not safe to move by himself and due to his L hemiplegia is at great risk of falling to his L.  Pt seemed to have poor awareness of his limitations.    Kinesiotape applied to hand and arm positioned on pillows to have arm elevated.    Bed alarm set, all needs met.  Pt reported he was much more relaxed and feeling better.   Therapy Documentation Precautions:  Precautions Precautions: Fall Precaution Comments: Lt hemi with right gaze preference Restrictions Weight Bearing Restrictions: No    Vital Signs: Therapy Vitals Temp: 99.4 F (37.4 C) Temp Source: Oral Pulse Rate: 73 Resp: 18 BP: (!) 142/73 Patient Position (if appropriate): Lying Oxygen Therapy SpO2: 100 % O2 Device: Room Air Pain: Pain Assessment Pain Scale: 0-10 Pain Score: 9  Faces Pain Scale: Hurts worst Pain Type: Chronic pain Pain Location: Back Pain Orientation: Right;Lower Pain Descriptors / Indicators: Aching;Throbbing ADL: ADL Eating: Set up Grooming: Setup Where Assessed-Grooming: Sitting at sink Upper Body Bathing: Supervision/safety Where Assessed-Upper Body Bathing: Shower Lower Body Bathing: Minimal assistance Where Assessed-Lower Body Bathing: Shower Upper Body Dressing: Minimal assistance Where Assessed-Upper Body Dressing: Sitting at sink Lower Body Dressing: Moderate assistance Where Assessed-Lower Body Dressing: Wheelchair Toileting: Moderate  assistance Where Assessed-Toileting: Bedside Commode, Toilet Toilet Transfer: Minimal assistance Toilet Transfer Method: Squat pivot Toilet Transfer Equipment: Grab bars, Raised toilet seat Social research officer, government: Moderate assistance Social research officer, government Method: Education officer, environmental: Radio broadcast assistant, Grab bars  Therapy/Group: Individual  Therapy  [place] 07/14/2020, 8:30 AM

## 2020-07-14 NOTE — Progress Notes (Signed)
Physical Therapy Weekly Progress Note  Patient Details  Name: [name] MRN: [id number] Date of Birth: [birth year]  Beginning of progress report period: July 07, 2020 End of progress report period: July 14, 2020  Today's Date: 07/14/2020 PT Individual Time: 0800-0900 + 1300-1330 PT Individual Time Calculation (min): 60 min + 30 min  Patient has met 2 of 5 short term goals.  Pt making slow progress towards goals. He is able to complete bed mobility with minA but sometimes requires modA depending on fatigue and hip/back pain. He is able to complete bed<>chair transfers via squat<>pivot with minA. We have discontinued his ambulation goal as he will not be a functional ambulator at time of discharge, added w/c propulsion goal. He is able to propel himself ~100-110f with supervision using hemi-technique with R hemibody - he requires maxA for w/c parts management.   Patient continues to demonstrate the following deficits muscle weakness, decreased cardiorespiratoy endurance, impaired timing and sequencing, unbalanced muscle activation, and decreased motor planning, decreased attention to left and decreased motor planning, decreased attention, decreased awareness, decreased problem solving, and decreased safety awareness, and decreased sitting balance, decreased standing balance, decreased postural control, hemiplegia, and decreased balance strategies and therefore will continue to benefit from skilled PT intervention to increase functional independence with mobility.  Patient progressing toward long term goals..  Plan of care revisions: Removed ambulation goal and added w/c propulsion goal.  PT Short Term Goals Week 3:  PT Short Term Goal 1 (Week 3): Pt will complete bed mobility with no more than minA consistently PT Short Term Goal 1 - Progress (Week 3): Partly met PT Short Term Goal 2 (Week 3): Pt will complete sit<>stand transfers with no more than minA consistently with LRAD PT Short Term  Goal 2 - Progress (Week 3): Met PT Short Term Goal 3 (Week 3): Pt will complete bed<>chair transfers with minA and LRAD PT Short Term Goal 3 - Progress (Week 3): Progressing toward goal PT Short Term Goal 4 (Week 3): Pt will ambulate 134fwith +2 modA PT Short Term Goal 4 - Progress (Week 3): Not met PT Short Term Goal 5 (Week 3): Pt will propel himself 2596fith modA on level surfaces via hemi-technique PT Short Term Goal 5 - Progress (Week 3): Met Week 4:  PT Short Term Goal 1 (Week 4): STG = LTG due to ELOS  Skilled Therapeutic Interventions/Progress Updates:     1st session: Pt received supine in bed to start session - agreeable to PT tx but reports 8/10 R lower back pain and 5/10 L groin pain. Pt reports he has been requesting pain medications but hasn't yet received them. Reached out to RN who arrived during session to provide rx. Removed boxer briefs with maxA while supine in bed and donned shorts over briefs with maxA via bridging technique. Supine<>sit with HOB flat and no use of bed rails, requiring modA for trunk management. Sit's unsupported EOB with SBA. Pt reports urgency to use toilet - due to urgency, used Stedy to [facility] - able to stand in stedy with minA. Pt continent of bladder and needs assist for managing lower body dressing in standing. Transferred to w/c in SteRosedaled removed w/c cushion to allow pt to propel himself. He propelled himself ~150f29fth supervision (!) with hemi technique on level surfaces and did a better job at naviSLM Corporationh less cues. Pt able to set himself up and position appropriately for squat<>pivot transfer, needing  only min cues - he does require totalA for LLE leg rest management. Completed squat<>pivot transfer with minA from w/c to mat table, towards stronger R side - demonstrated good ability to lift hips and clear over w/c wheel. Next, completed lateral scoots along mat table which he did with CGA and needing assist for  LLE placement. Completed sit>supine with modA to flat mat table with assist for BLE and trunk management - low back pain limiting ability. Completed torso twists in supine in hooklying position, piriformis stretching, stretching external rotator of BLE's, bilateal and unilateral knee-to-chest back stretches and rolling to the R with minA. Provided ice pack throughout supine there-ex for low back pain. Required modA for sit>Supine on mat table and minA for squat<>pivot transfer to w/c. Pt returned to his room and remained seated in w/c with 1/2 lap tray for LUE, kpad (on cooling setting) to low back, and safety belt alarm on.  2nd session: Pt greeted supine in bed to start session - agreeable to PT tx. He continues to report moderate R low back, reports he received recent pain medication - denies kPad (on cooling setting) after session. Repositioning and mobility provided for management. Spoke with pt regarding DME rec's, including hospital bed. Pt called wife, [name], to update - spoke with Cherly on the phone regarding purpose of hospital bed including positioning, skin integrity, meals, and caregiver burden. Wife concerned about finding room in the house and need to consider options. Wife appearing overwhelmed with DME and level of care - encouraged her to reach out to [facility] for guidance on DME and other community services for aid. Pt able to complete supine<>sit with minA with use of bed features, low back pain limiting. Able to perform lateral scoots along EOB with CGA with totalA needed for LLE management. Completed sit>supine with minA with HOB elevated and pt repositioned in bed. Remained semi-reclined with needs in reach and bed alarm on.  Therapy Documentation Precautions:  Precautions Precautions: Fall Precaution Comments: Lt hemi with right gaze preference Restrictions Weight Bearing Restrictions: No General:    Therapy/Group: Individual Therapy  Vanesha [place] P Cecia Egge PT 07/14/2020,  7:40 AM

## 2020-07-15 LAB — URINALYSIS, ROUTINE W REFLEX MICROSCOPIC
Bilirubin Urine: NEGATIVE
Glucose, UA: NEGATIVE mg/dL
Hgb urine dipstick: NEGATIVE
Ketones, ur: NEGATIVE mg/dL
Nitrite: NEGATIVE
Protein, ur: NEGATIVE mg/dL
Specific Gravity, Urine: 1.015 (ref 1.005–1.030)
pH: 5 (ref 5.0–8.0)

## 2020-07-15 MED ORDER — HYDROCODONE-ACETAMINOPHEN 5-325 MG PO TABS
1.0000 | ORAL_TABLET | Freq: Four times a day (QID) | ORAL | Status: DC | PRN
  Administered 2020-07-15 – 2020-07-21 (×13): 1 via ORAL
  Filled 2020-07-15 (×14): qty 1

## 2020-07-15 NOTE — Progress Notes (Signed)
PHYSICAL MEDICINE & REHABILITATION PROGRESS NOTE  Subjective/Complaints: Continues to have pain most severe in right lower back. Reviewed XR findings with him. Added Norco since Tramadol was not helping much.  He has no other complaints  ROS:   Pt denies SOB, abd pain, CP, N/V/C/D, and vision changes, +left pec pain, right ankle pain has resolved, +right sided lower back pain- this is the worst pain today, +left sided weakness.    Objective: Vital Signs: Blood pressure 140/72, pulse 86, temperature 97.8 F (36.6 C), resp. rate 15, height 5\' 10"  (1.778 m), weight 105.4 kg, SpO2 100 %. DG Lumbar Spine 2-3 Views  Result Date: 07/14/2020 CLINICAL DATA:  Back pain EXAM: LUMBAR SPINE - 2-3 VIEW COMPARISON:  07/31/2017 FINDINGS: Hypoplastic ribs at T12. Stable lumbar alignment. Vertebral body heights are maintained. Mild disc space narrowing at L4-L5 and L5-S1 with small degenerative osteophytes at L2-L3 and L3-L4. IMPRESSION: Mild degenerative changes Electronically Signed   By: 10/01/2017 M.D.   On: 07/14/2020 15:58   DG HIP UNILAT WITH PELVIS 2-3 VIEWS LEFT  Result Date: 07/14/2020 CLINICAL DATA:  Left-sided hip pain EXAM: DG HIP (WITH OR WITHOUT PELVIS) 2-3V LEFT COMPARISON:  07/13/2020 FINDINGS: Pubic symphysis and rami appear intact. No fracture or malalignment. Mild to moderate degenerative change of the left hip. IMPRESSION: Degenerative changes.  No acute osseous abnormality Electronically Signed   By: 07/15/2020 M.D.   On: 07/14/2020 15:57   DG HIP UNILAT WITH PELVIS 2-3 VIEWS RIGHT  Result Date: 07/13/2020 CLINICAL DATA:  Right hip pain EXAM: DG HIP (WITH OR WITHOUT PELVIS) 3V RIGHT COMPARISON:  None. FINDINGS: Pelvic ring is intact. Degenerative changes of the hip joints are noted. No acute fracture or dislocation is noted. No soft tissue abnormality is seen. IMPRESSION: Degenerative change without acute abnormality. Electronically Signed   By: 07/15/2020 M.D.   On:  07/13/2020 17:44   No results for input(s): WBC, HGB, HCT, PLT in the last 72 hours.    No results for input(s): NA, K, CL, CO2, GLUCOSE, BUN, CREATININE, CALCIUM in the last 72 hours.     Intake/Output Summary (Last 24 hours) at 07/15/2020 1240 Last data filed at 07/15/2020 0700 Gross per 24 hour  Intake 500 ml  Output 1150 ml  Net -650 ml        Physical Exam: BP 140/72 (BP Location: Right Arm)   Pulse 86   Temp 97.8 F (36.6 C)   Resp 15   Ht 5\' 10"  (1.778 m)   Wt 105.4 kg   SpO2 100%   BMI 33.34 kg/m   Gen: no distress, normal appearing HEENT: oral mucosa pink and moist, NCAT Cardio: Reg rate Chest: normal effort, normal rate of breathing Abd: soft, non-distended Ext: no edema Psych: pleasant, normal affect Skin: intact Neuro: Cerebellar exam normal finger to nose to finger as well as heel to shin in bilateral upper and lower extremities Musculoskeletal: Full range of motion in all 4 extremities. No joint swelling- L shoulder and L hand have Ktaping on them- tenderness at left hip add origin Left pec tender to palpation Dysarthria, stable Motor: LUE: 0/5 proximal to distal LLE: HF, KE 2-/5, ADF 1/5, unchanged Left inattention RUE/RLE: 5/5 proximal distal Sensation intact LT on RIght , absent on left    Assessment/Plan: 1. Functional deficits which require 3+ hours per day of interdisciplinary therapy in a comprehensive inpatient rehab setting. Physiatrist is providing close team supervision and 24 hour management of  active medical problems listed below. Physiatrist and rehab team continue to assess barriers to discharge/monitor patient progress toward functional and medical goals   Care Tool:  Bathing    Body parts bathed by patient: Chest, Abdomen, Left arm, Face, Front perineal area, Right lower leg, Right upper leg, Left upper leg, Left lower leg, Right arm (used long sponge)   Body parts bathed by helper: Right arm, Buttocks Body parts n/a: Right  arm, Buttocks, Left lower leg   Bathing assist Assist Level: Minimal Assistance - Patient > 75%     Upper Body Dressing/Undressing Upper body dressing   What is the patient wearing?: Pull over shirt    Upper body assist Assist Level: Minimal Assistance - Patient > 75%    Lower Body Dressing/Undressing Lower body dressing      What is the patient wearing?: Incontinence brief, Pants     Lower body assist Assist for lower body dressing: Moderate Assistance - Patient 50 - 74%     Toileting Toileting    Toileting assist Assist for toileting: Maximal Assistance - Patient 25 - 49%     Transfers Chair/bed transfer  Transfers assist     Chair/bed transfer assist level: Minimal Assistance - Patient > 75% (squat pivot)     Locomotion Ambulation   Ambulation assist   Ambulation activity did not occur: Safety/medical concerns          Walk 10 feet activity   Assist  Walk 10 feet activity did not occur: Safety/medical concerns        Walk 50 feet activity   Assist Walk 50 feet with 2 turns activity did not occur: Safety/medical concerns         Walk 150 feet activity   Assist Walk 150 feet activity did not occur: Safety/medical concerns         Walk 10 feet on uneven surface  activity   Assist Walk 10 feet on uneven surfaces activity did not occur: Safety/medical concerns         Wheelchair     Assist Will patient use wheelchair at discharge?: Yes Type of Wheelchair: Manual    Wheelchair assist level: Minimal Assistance - Patient > 75% Max wheelchair distance: 189ft    Wheelchair 50 feet with 2 turns activity    Assist        Assist Level: Minimal Assistance - Patient > 75%   Wheelchair 150 feet activity     Assist      Assist Level: Minimal Assistance - Patient > 75%    Medical Problem List and Plan: 1.  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 Course complicated by seizure  Continue PT, OT and SLP  WHO/PRAFO nightly 2.  Impaired mobility -DVT/anticoagulation:  Pharmaceutical: Continue Lovenox             -antiplatelet therapy: N/A due to bleed 3.  Ongoing headaches/pain Management: Continue Depakote and Keppra.  Continue Topamax to 50mg  4. Mood: LCSW to follow for evaluation and support             -antipsychotic agents: N/AA 5. Neuropsych: This patient is intermittently capable of making decisions on his own behalf. 6. Skin/Wound Care: Routine pressure-relief measures. Eucerin ordered for dry skin. Recommended wife bring coconut oil from home.  7. Fluids/Electrolytes/Nutrition: Monitor intake/output. Discussed anti-inflammatory diet, replacing ginger ale with water during the day and tart cherry juice at night, cranberry juice if he really wants juice during the  day. 9.  Partial complex seizure: Continue Keppra 500 mg twice daily. 10.  HTN: Monitor blood pressures 3 times daily.              -- Labile, continue amlodipine, labetalol, losartan and clonidine             -SBP goal of <160 given ICH  6/11-6/20- BP controlled- continue regimen Vitals:   07/15/20 0438 07/15/20 0833  BP: 124/74 140/72  Pulse: 77 86  Resp: 18 15  Temp: 98.2 F (36.8 C) 97.8 F (36.6 C)  SpO2: 96% 100%   11.  Dyslipidemia: Continue statin. 12.  Acute kidney injury:   Resolved  13.  History of alcohol abuse/delirium: Continue Seroquel 25 mg at bedtime to help with sleep/wake cycle. Discussed this would not be long term             -- Continue folic acid and thiamine supplementation 14.  Acute blood loss anemia:   Hemoglobin 11.8 on 6/1, stable at 11.4  Continue to monitor 15.  Morbid obesity/OHS/OSA?: Outpatient sleep study recommended for work-up 16.  Post stroke dysphagia  D3 thins, advance diet as tolerated  6/22- tolerating diet- no issues- continue regimen 17. Right ankle pain: suspect sprain given tenderness over ATFL. Ice  15mg  TID. Given chronicity of pain and pain with weightbearing, ordered right ankle XR- discussed with wife that there is no evidence of fracture or athropathy. Continue voltaren gel, d/c capsaicin. Requested RN soak foot in epsom salt if available. Ankle support brace ordered and patient wearing. Advised to elevate when resting, minimize weightbearing while with therapy to allow ligament to heal. Provided list of anti-inflammatory foods for pain. Discussed risks and benefits of various medications such as tylenol, NSAIDs, and prednisone and patient and wife decided on above conservative treatment and tylenol for now.    -improved.  18.  Urinary urgency but unable to void, suspect spastic bladder with BPH , start flomax, BP should support this med , as discussed with RN would check bladder scan as well  19. Left shoulder pain: continue lidocaine and voltaren gel. Advised wife to bring in blue emu oil at home. Discussed XR results: mild subluxation  20. Confusion in morning as per wife: monitor for worsening with restart of Gabapentin and tramadol.  21. Low back pain, right side: continue heating pad and add lidocaine patch. Discussed XR findings with patient- OA w/ osteophytes. Norco ordered as tramadol ineffective. Continue lidocaine patch.  22. Left groin pain: restart Gabapentin 100mg  TID- may be more muscular, stays in ext rotation at hip due to LLE weakness. XR of hip shows degeneration: discussed with patient.   23. Left pectoralis pain: lidocaine injected 6/20. Discussed benefits of Botox injection. Will discuss with wife for outpatient appointment in August. Improved.  24. Disposition: HFU scheduled with me on 09/17/20 1:00PM. FL2 form completed, wife has requested copy as well.    LOS: 23 days A FACE TO FACE EVALUATION WAS PERFORMED  September [name] 07/15/2020, 12:40 PM

## 2020-07-15 NOTE — Progress Notes (Signed)
Physical Therapy Session Note  Patient Details  Name: [name] MRN: [id number] Date of Birth: 21-Dec-1965  Today's Date: 07/15/2020 PT Individual Time: 1000-1053 PT Individual Time Calculation (min): 53 min   Short Term Goals: Week 4:  PT Short Term Goal 1 (Week 4): STG = LTG due to ELOS  Skilled Therapeutic Interventions/Progress Updates:    Pt received seated in w/c to start session, agreeable to PT treatment. Reports need to void, required totalA for holding urinal while he voided. Pt reported continued low back pain - XR yesterday negative for any fractures and show just mild degenerative changes. MD arriving for morning rounds who discussed pain management techniques and other concerns. Without w/c cushion in chair, he propelled himself from his room to ortho gym, >264f, with supervision via hemi technique - cues for wide turns for doorways. Focused remainder of session on functional car transfers. He completed a total of 3 car transfers, [place] He needs assist for setup and maxA for LLE management in/out of car. Car transfer completed via squat pivot with car height set to simulate [employer] He was instructed to always hold on to the grab bar of car frame to allow hip/trunk management for repositioning and sitting balance to reduce leaning L. He also needs cues for guided breathing as he tends to Valsalva during mobility tasks. He ended session with handoff of care to OT in hallways with all needs met.  Therapy Documentation Precautions:  Precautions Precautions: Fall Precaution Comments: Lt hemi with right gaze preference Restrictions Weight Bearing Restrictions: No General:    Therapy/Group: Individual Therapy  Catia Todorov P Cici Rodriges PT 07/15/2020, 7:32 AM

## 2020-07-15 NOTE — Discharge Instructions (Addendum)
Inpatient Rehab Discharge Instructions  [name] Discharge date and time:  07/21/20  Activities/Precautions/ Functional Status: Activity: activity as tolerated with assistance Diet: cardiac diet Wound Care: none needed  Functional status:  ___ No restrictions     ___ Walk up steps independently _X__ 24/7 supervision/assistance   ___ Walk up steps with assistance ___ Intermittent supervision/assistance  ___ Bathe/dress independently ___ Walk with walker     __X_ Bathe/dress with assistance ___ Walk Independently    ___ Shower independently ___ Walk with assistance    ___ Shower with assistance _X__ No alcohol     ___ Return to work/school ________   Special Instructions: Can use heat to back at nights--do not use it with lidocaine patches. Can also use ice on and off to back muscles for spasms. Work on posture and activity out of bed. Work on Scientist, clinical (histocompatibility and immunogenetics) OUT OF BED CALL OFFICE AROUND July 22ND OR 25TH TO GET REFILLS ON MEDICATIONS.  WE WILL REFILL MEDICATIONS ONCE .  KEEP APPOINTMENT FOR PRIMARY CARE WHICH IS SET FOR AUGUST 3RD--THEY WILL FOLLOW YOU FOR MEDICAL ISSUES/MEDICATIONS.     COMMUNITY REFERRALS UPON DISCHARGE:    Home Health:   PT, OT, SP                Agency:[facility] [phone]   Medical Equipment/Items Ordered:WHEELCHAIR, DROP-ARM BEDSIDE COMMODE, TUB BENCH, HOSPITAL BED                                                 Agency/Supplier:[facility]  [phone]   STROKE/TIA DISCHARGE INSTRUCTIONS SMOKING Cigarette smoking nearly doubles your risk of having a stroke & is the single most alterable risk factor  If you smoke or have smoked in the last 12 months, you are advised to quit smoking for your health. Most of the excess cardiovascular risk related to smoking disappears within a year of stopping. Ask you doctor about anti-smoking medications [place] Quit Line: 1-800-QUIT NOW Free Smoking Cessation Classes [phone]   CHOLESTEROL Know your levels; limit fat & cholesterol in your diet  Lipid Panel     Component Value Date/Time   CHOL 196 06/09/2020 1809   TRIG 153 (H) 06/12/2020 0420   HDL 52 06/09/2020 1809   CHOLHDL 3.8 06/09/2020 1809   VLDL 66 (H) 06/09/2020 1809   LDLCALC 78 06/09/2020 1809     Many patients benefit from treatment even if their cholesterol is at goal. Goal: Total Cholesterol (CHOL) less than 160 Goal:  Triglycerides (TRIG) less than 150 Goal:  HDL greater than 40 Goal:  LDL (LDLCALC) less than 100   BLOOD PRESSURE American Stroke Association blood pressure target is less that 120/80 mm/Hg  Your discharge blood pressure is:  BP: 135/75 Monitor your blood pressure Limit your salt and alcohol intake Many individuals will require more than one medication for high blood pressure  DIABETES (A1c is a blood sugar average for last 3 months) Goal HGBA1c is under 7% (HBGA1c is blood sugar average for last 3 months)  Diabetes: No known diagnosis of diabetes    Lab Results  Component Value Date   HGBA1C 5.5 06/09/2020    Your HGBA1c can be lowered with medications, healthy diet, and exercise. Check your blood sugar as directed by your physician Call your physician if you experience unexplained or low blood  sugars.  PHYSICAL ACTIVITY/REHABILITATION Goal is 30 minutes at least 4 days per week  Activity: No driving, Therapies: see above Return to work: N/A Activity decreases your risk of heart attack and stroke and makes your heart stronger.  It helps control your weight and blood pressure; helps you relax and can improve your mood. Participate in a regular exercise program. Talk with your doctor about the best form of exercise for you (dancing, walking, swimming, cycling).  DIET/WEIGHT Goal is to maintain a healthy weight  Your discharge diet is:  Diet Order             Diet Heart Room service appropriate? Yes; Fluid consistency: Thin  Diet effective now                    liquids Your height is:  Height: 5\' 10"  (177.8 cm) Your current weight is: Weight: 232 lbs Your Body Mass Index (BMI) is:  BMI (Calculated): 33.34 Following the type of diet specifically designed for you will help prevent another stroke. Your goal weight is 167 Your goal Body Mass Index (BMI) is 19-24. Healthy food habits can help reduce 3 risk factors for stroke:  High cholesterol, hypertension, and excess weight.  RESOURCES Stroke/Support Group:  Call [phone]   STROKE EDUCATION PROVIDED/REVIEWED AND GIVEN TO PATIENT Stroke warning signs and symptoms How to activate emergency medical system (call 911). Medications prescribed at discharge. Need for follow-up after discharge. Personal risk factors for stroke. Pneumonia vaccine given:  Flu vaccine given:  My questions have been answered, the writing is legible, and I understand these instructions.  I will adhere to these goals & educational materials that have been provided to me after my discharge from the hospital.      My questions have been answered and I understand these instructions. I will adhere to these goals and the provided educational materials after my discharge from the hospital.  Patient/Caregiver Signature _______________________________ Date __________  Clinician Signature _______________________________________ Date __________  Please bring this form and your medication list with you to all your follow-up doctor's appointments.

## 2020-07-15 NOTE — Progress Notes (Signed)
Occupational Therapy Session Note  Patient Details  Name: [name] MRN: [id number] Date of Birth: 1965/07/17  Today's Date: 07/15/2020 OT Individual Time: 8338-2505 OT Individual Time Calculation (min): 30 min    Short Term Goals:  Week 3:  OT Short Term Goal 1 (Week 3): Pt will be able to don pants over his feet with min A. OT Short Term Goal 2 (Week 3): Pt will be able to complete pericare and clothing mgt during toileting with min A. OT Short Term Goal 3 (Week 3): Caregiver will assist pt with squat pivot functional transfers needing only min multimodal cues. OT Short Term Goal 4 (Week 3): Pt will be able to actively raise LUE to wash under arm with VCs only.  Skilled Therapeutic Interventions/Progress Updates:    Pt sitting up in w/c, no c/o pain at rest, agreeable to OT session.  Pt transported to ortho gym for time mgt.  Completed neuro re-ed of LUE including forearm rotation into supination using external target and Vcs to promote motor planning.  Discussed remaining calm when attempting target movements in order to increase focus and reduce frustration in order to improve motor control.  Pt notably able to successfully reach target during movement when implementing calming techniques.  Pt completed 3 x 10 reps forearm supination/pronation with arm in supported position.  Also completed isolated wrist extension against gravity while holding sponge in same manner. Improved strength and control in forearm supination and wrist extension noted this session. Pt returned to room, call bell in reach, seat alarm on at end of session.    Therapy Documentation Precautions:  Precautions Precautions: Fall Precaution Comments: Lt hemi with right gaze preference Restrictions Weight Bearing Restrictions: No    Therapy/Group: Individual Therapy  Amie Critchley 07/15/2020, 3:09 PM

## 2020-07-15 NOTE — Progress Notes (Signed)
Occupational Therapy Session Note  Patient Details  Name: [name] MRN: [id number] Date of Birth: 12/28/1965  Today's Date: 07/15/2020 OT Individual Time: 0930-1000 OT Individual Time Calculation (min): 30 min    Short Term Goals: Week 3:  OT Short Term Goal 1 (Week 3): Pt will be able to don pants over his feet with min A. OT Short Term Goal 2 (Week 3): Pt will be able to complete pericare and clothing mgt during toileting with min A. OT Short Term Goal 3 (Week 3): Caregiver will assist pt with squat pivot functional transfers needing only min multimodal cues. OT Short Term Goal 4 (Week 3): Pt will be able to actively raise LUE to wash under arm with VCs only.  Skilled Therapeutic Interventions/Progress Updates:  Pt received in bed with wet clothing and wet sheets. Pt cued to actively flex L knee and hip to bring knees up and together which he did and then move knees toward R side of bed.  Cues to manage L arm with rolling to his R shoulder and min A to roll, once sidelying pt sat to EOB with min A and cues to push up with R arm.    Worked on numerous sit to stands with pt using R hand on rail of bed as therapist stood pt with min A.  Pt cleansed front area, therapist cleansed back. Pt able to release hand and help with partial clothing management.  Washed and dressed UB with min A.  Pt able to actively lift arm to side to wash under his arm!  Squat pivot to L side in 2 movements (scooting closer to EOB and then to chair) with only min A.  Hand off to PT for next session.      Therapy Documentation Precautions:  Precautions Precautions: Fall Precaution Comments: Lt hemi with right gaze preference Restrictions Weight Bearing Restrictions: No  Vital Signs: Therapy Vitals Temp: 98.2 F (36.8 C) Temp Source: Oral Pulse Rate: 77 Resp: 18 BP: 124/74 Patient Position (if appropriate): Lying Oxygen Therapy SpO2: 96 % Pain: Pain Assessment Pain Scale: 0-10 Pain Score: 7   Pain Type: Acute pain Pain Location: Back Pain Orientation: Right;Lower Pain Descriptors / Indicators: Sharp;Aching Pain Frequency: Intermittent Pain Intervention(s): Medication (See eMAR) ADL: ADL Eating: Set up Grooming: Setup Where Assessed-Grooming: Sitting at sink Upper Body Bathing: Supervision/safety Where Assessed-Upper Body Bathing: Shower Lower Body Bathing: Minimal assistance Where Assessed-Lower Body Bathing: Shower Upper Body Dressing: Minimal assistance Where Assessed-Upper Body Dressing: Sitting at sink Lower Body Dressing: Moderate assistance Where Assessed-Lower Body Dressing: Wheelchair Toileting: Moderate assistance Where Assessed-Toileting: Bedside Commode, Toilet Toilet Transfer: Minimal assistance Toilet Transfer Method: Squat pivot Toilet Transfer Equipment: Grab bars, Raised toilet seat Film/video editor: Moderate assistance Film/video editor Method: Administrator: Emergency planning/management officer, Grab bars   Therapy/Group: Individual Therapy  Matti Killingsworth 07/15/2020, 8:30 AM

## 2020-07-15 NOTE — Progress Notes (Signed)
Occupational Therapy Session Note  Patient Details  Name: [name] MRN: [id number] Date of Birth: 09/17/1965  Today's Date: 07/15/2020 OT Individual Time: 1300-1344 OT Individual Time Calculation (min): 44 min    Short Term Goals: Week 3:  OT Short Term Goal 1 (Week 3): Pt will be able to don pants over his feet with min A. OT Short Term Goal 2 (Week 3): Pt will be able to complete pericare and clothing mgt during toileting with min A. OT Short Term Goal 3 (Week 3): Caregiver will assist pt with squat pivot functional transfers needing only min multimodal cues. OT Short Term Goal 4 (Week 3): Pt will be able to actively raise LUE to wash under arm with VCs only.  Skilled Therapeutic Interventions/Progress Updates:   Pt received in w/c in room and consented to OT tx. Pt req min A for squat pivot transfer from w/c to mat in therapy gym going towards r side. Pt instructed in wash cloth activity, trained in step by step activity breakdown to reach across midline with LUE, rest, wrap thumb around wash cloth, rest, fully grasp wash cloth, rest, then bring washcloth to tray in front of him. Task completed  with increased time to improve function and strength of LUE. Rest breaks taken in between each step as pt holds his breath constantly during tasks that require concentration. Pt frequently dropped wash cloths at beginning of session, however functional grasp and function on LUE improved as activity went on and pt followed verbal cues for step by step tasks from therapist. Session also focused on sitting balance as pt was sitting on mat with no BUE support and instructed to reach across midline for each wash cloth. After tx, pt helped back to room, w/c cushion added to seat, and left up in w/c with all needs met, seatbelt alarm on.   Therapy Documentation Precautions:  Precautions Precautions: Fall Precaution Comments: Lt hemi with right gaze preference Restrictions Weight Bearing  Restrictions: No  Pain: Pain Assessment Pain Scale: 0-10 Pain Score: 5  Pain Type: Acute pain    Therapy/Group: Individual Therapy     07/15/2020, 12:54 PM 

## 2020-07-15 NOTE — Progress Notes (Signed)
Speech Language Pathology Daily Session Note  Patient Details  Name: [name] MRN: [id number] Date of Birth: [birth year]  Today's Date: 07/15/2020 SLP Individual Time: 0800-0900 SLP Individual Time Calculation (min): 60 min  Short Term Goals: Week 4: SLP Short Term Goal 1 (Week 4): Pt will demonstrate selective attention for 30-45 minutes in mildly distracting environments with supervision A verbal cues. SLP Short Term Goal 2 (Week 4): Pt will demonstrate mildly complex problem solving skills in functional tasks with supervision A verbal cues. SLP Short Term Goal 3 (Week 4): Pt will demonstrate self-monitoring and self-correction of errors in problem solving tasks with Supervision A verbal cues. SLP Short Term Goal 4 (Week 4): Patient will recall new, daily information with supervision verbal and visual cues  Skilled Therapeutic Interventions:   Patient seen for skilled ST session focusing on cognitive function goals. When SLP arrived in room, patient awake but lying in bed and endorsing a 5/10 pain in back but that RN was already aware. He asked SLP about MD starting him on some "prednisone" but when SLP then checked with MD, this was actually "tramadol". Patient able to read 22 point font text without difficult and without cues for scanning left to right. He recalled some of therapist's names but continues to get confused about their roles in therapy. He demonstrated improved emergent awareness but continues to benefit from min-modA cues for anticipatory awareness. Patient continues to benefit from skilled SLP intervention to maximize cognitive function prior to discharge.  Pain Pain Assessment Pain Scale: 0-10 Pain Score: 5  Pain Type: Acute pain  Therapy/Group: Individual Therapy   Angela Nevin, MA, CCC-SLP Speech Therapy

## 2020-07-16 NOTE — Progress Notes (Signed)
Physical Therapy Session Note  Patient Details  Name: [name] MRN: [id number] Date of Birth: [birth year]  Today's Date: 07/16/2020 PT Individual Time: 1115-1200 + 1430-1530 PT Individual Time Calculation (min): 45 min + 60 min  Short Term Goals: Week 4:  PT Short Term Goal 1 (Week 4): STG = LTG due to ELOS  Skilled Therapeutic Interventions/Progress Updates:     1st session: Pt greeted seated in w/c to start session, agreeable to PT tx - deferred asking pain to avoid pain focused behaviors during session - did not appear in distress during session. At start of session, pt voiced frustration regarding stroke recovery and was emotional with his current status. Provided words of encouragement and positive thoughts - reached out to medical team for Chaplain services and pt was agreeable as well. Transported in w/c with totalA for time management during session. In ortho gym, worked on blocked practice squat<>pivot transfers from w/c to mat table, progressing from minA to [place] by end of session (!!!) but mod cues needed for setup and sequencing. Next, worked on repeated sit<>stands from low mat table height with mirror for visual aid - completed 1x10 with very light CGA (!!) without episodes of L knee buckling (does favor his R side). Next, completed sit<>stands to mirror with task overlay for remove hanging horseshoes over mirror with bias to his L side to promote L weight shifts - knee instability present during this task and required min/modA for balance. Ended session working on [employer] - completed static standing marching with back of chair for UE support - required minA while raising L foot and modA while raising R foot - significant L knee instability during stance time and poor ability to correct. Pt returned to room at end of session in w/c and provided 1/2 lap tray for supporting LUE. All needs met with Kpad on cooling setting applied to low back as pt reporting onset of increased  low back pain. RN notified at end of session as well.   2nd session: Pt received sitting in w/c, agreeable to PT session. No reports of pain. Removed w/c cushion to allow self propulsion. He propelled himself ~217f with supervision via hemi technique in the w/c to main rehab gym. Focused majority of session on gait training in hallway. Used mirror for visual aid and +2 assist for w/c follow for safety. He ambulated 154f+ 205f [id number] min/modA and R hand rail with +2 assist for w/c follow. Used shoe cover to L toebox to allow improved swing through during gait and required assist for blocking L knee in stance and swinging leg through. Pt very pleased with his progress and ability to ambulate! Next, wheeled pt outdoors to practice curb training in the w/c as pt has 1 STE his house (resembles a curb) . Educated pt on technique and practiced with pt to allow him to assist in directing care. Will need to include wife with family training on Monday for curb training. Pt returned upstairs and remained seated in w/c (cushion provided) with safety belt alarm on and all needs within reach.   Therapy Documentation Precautions:  Precautions Precautions: Fall Precaution Comments: Lt hemi with right gaze preference Restrictions Weight Bearing Restrictions: No General:    Therapy/Group: Individual Therapy  ChrisAlger Simons/2022, 7:33 AM

## 2020-07-16 NOTE — Progress Notes (Signed)
Speech Language Pathology Daily Session Note  Patient Details  Name: [name] MRN: [id number] Date of Birth: Aug 02, 1965  Today's Date: 07/16/2020 SLP Individual Time: 1330-1400 SLP Individual Time Calculation (min): 30 min  Short Term Goals: Week 4: SLP Short Term Goal 1 (Week 4): Pt will demonstrate selective attention for 30-45 minutes in mildly distracting environments with supervision A verbal cues. SLP Short Term Goal 2 (Week 4): Pt will demonstrate mildly complex problem solving skills in functional tasks with supervision A verbal cues. SLP Short Term Goal 3 (Week 4): Pt will demonstrate self-monitoring and self-correction of errors in problem solving tasks with Supervision A verbal cues. SLP Short Term Goal 4 (Week 4): Patient will recall new, daily information with supervision verbal and visual cues  Skilled Therapeutic Interventions: Skilled ST intervention performed with focus on cognitive goals. ST re-administered the [facility] Mental Status Exam (SLUMS) to re-assess cognitive function and progress. Patient scored 25/30 points with score 27/30 considered to be normal. Patient obtained full points in all areas with the exception of short-term recall (3/5 points on list of 5 words, and 6/8 on paragraph recall). Patient benefited from repetition and extended processing time. Improvements noted in orientation, visuospatial awareness, and short-term recall as compared to initial evaluation. Educated patient on results and compensatory memory strategies to enhance functional recall. Plan for family ed next week. Patient was left in wheelchair with alarm activated and needs within reach. Continue per ST POC.    Pain Pain Assessment Pain Scale: 0-10 Pain Score: 0-No pain Pain Type: Acute pain Pain Location: Back Pain Orientation: Right;Lower Pain Descriptors / Indicators: Aching;Sharp Pain Frequency: Intermittent Pain Intervention(s): Medication (See  eMAR)  Therapy/Group: Individual Therapy  Tamala Ser 07/16/2020, 4:45 PM

## 2020-07-16 NOTE — Progress Notes (Signed)
This chaplain attempted PMT consult for spiritual care.  The Pt. is participating in PT at the time of the visit.  This chaplain will F/U at the best time for the Pt. and the chaplain.

## 2020-07-16 NOTE — Progress Notes (Signed)
Inpatient Rehabilitation Care Coordinator Discharge Note  The overall goal for the admission was met for:   Discharge location: Yes-HOME WITH WIFE WHO IS TAKING A LEAVE FROM WORK TO PROVIDE CARE  Length of Stay: Yes-29 days  Discharge activity level: Yes  Home/community participation: Yes  Services provided included: MD, RD, PT, OT, SLP, RN, CM, TR, Pharmacy, Neuropsych, and SW  Financial Services: Private Insurance: [employer] offered to/list presented to:PT AND WIFE  Follow-up services arranged: Home Health: [facility]-PT,OT,SP, DME: [facility]-WHEELCHAIR, HOSPITAL BED, DROP-ARM BSC, TUB BENCH, and Patient/Family has no preference for HH/DME agencies  Comments (or additional information):WIFE WAS IN FOR ONE DAY OF EDUCATION AWARE OF HIS 24/7 PHYSICAL CARE NEEDS. CONCERN WILL NOT BE ABLE TO MANAGE HIM AT HOME WITH HIS CARE NEEDS. HIGH RISK TO BE RE-ADMITTED TO [facility]  Patient/Family verbalized understanding of follow-up arrangements: Yes  Individual responsible for coordination of the follow-up plan: CHERYL-WIFE [phone]  Confirmed correct DME delivered: Elease Hashimoto 07/16/2020    Darvin Dials, Gardiner Rhyme

## 2020-07-16 NOTE — Progress Notes (Signed)
Occupational Therapy Weekly Progress Note  Patient Details  Name: [name] MRN: [id number] Date of Birth: Jan 09, 1966  Beginning of progress report period: July 09, 2020 End of progress report period: July 16, 2020  Today's Date: 07/16/2020 OT Individual Time: 0930-1030 OT Individual Time Calculation (min): 60 min    Patient has met 4 of 4 short term goals.  Pt has made excellent progress with his balance, transfers, LUE active movement.  He is now at a min A level overall.  He needs to continue to work on L side strength and awareness and integration of techniques he and his wife can use at home.   Patient continues to demonstrate the following deficits: abnormal tone and unbalanced muscle activation, decreased attention to left, and decreased sitting balance, decreased standing balance, decreased postural control, hemiplegia, and decreased balance strategies and therefore will continue to benefit from skilled OT intervention to enhance overall performance with BADL and Reduce care partner burden.  Patient is progressing toward many of his long term goals, but some have been revised.   - min A for dynamic balance is with clothing adjustment during toileting and dressing only -min A for LB dressing is for pants only, he will need increased A for shoes  OT Short Term Goals Week 3:  OT Short Term Goal 1 (Week 3): Pt will be able to don pants over his feet with min A. OT Short Term Goal 1 - Progress (Week 3): Met OT Short Term Goal 2 (Week 3): Pt will be able to complete pericare and clothing mgt during toileting with min A. OT Short Term Goal 2 - Progress (Week 3): Met OT Short Term Goal 3 (Week 3): Caregiver will assist pt with squat pivot functional transfers needing only min multimodal cues. OT Short Term Goal 3 - Progress (Week 3): Met OT Short Term Goal 4 (Week 3): Pt will be able to actively raise LUE to wash under arm with VCs only. OT Short Term Goal 4 - Progress (Week 3):  Met Week 4:  OT Short Term Goal 1 (Week 4): STGs = LTGs  Skilled Therapeutic Interventions/Progress Updates:    Pt seen for BADL training of shower and dressing. Due to recent severe back pain and time constraints, used stedy to move from bed to shower bench today. Focused on functional movement patterns of using LLE actively with bending knees to prep for rolling in bed, flexing hips to come to sit to EOB, actively adjusting leg back to neutral in stedy.  Pt now able to reach L hand to stedy bar. In shower used HOH facilitation for LUE to wash R arm and pt also used long sponge. Pt used lateral leans for therapist to assist with his bottom.   Moved to EOB for dressing with improved postural control with occasional cues to engage core to not "fall" back.  Able to don shorts over feet with CGA then used R hand on bed rail to stand with CGA and then was able to let go and pull pants 80% over his hips with min A for balance.  Squat pivot to L to wc 2x with CGA.  Pt then adjusted himself in chair.  Excellent participation today. Pt in wc with belt alarm on and all needs met.  Therapy Documentation Precautions:  Precautions Precautions: Fall Precaution Comments: Lt hemi with right gaze preference Restrictions Weight Bearing Restrictions: No    Vital Signs: Therapy Vitals Pulse Rate: 80 Resp: 18 BP: (!) 150/94 Patient  Position (if appropriate): Sitting Oxygen Therapy SpO2: 100 % O2 Device: Room Air Pain: Pain Assessment Pain Scale: 0-10 Pain Score: 4  Pain Type: Acute pain Pain Location: Back Pain Orientation: Right;Lower Pain Intervention(s): Medication (See eMAR) ADL: ADL Eating: Set up Grooming: Setup Where Assessed-Grooming: Sitting at sink Upper Body Bathing: Supervision/safety Where Assessed-Upper Body Bathing: Shower Lower Body Bathing: Minimal assistance Where Assessed-Lower Body Bathing: Shower Upper Body Dressing: Contact guard Where Assessed-Upper Body Dressing:  Edge of bed Lower Body Dressing: Minimal assistance Where Assessed-Lower Body Dressing: Edge of bed Toileting: Moderate assistance Where Assessed-Toileting: Bedside Commode, Toilet Toilet Transfer: Minimal assistance Toilet Transfer Method: Squat pivot Toilet Transfer Equipment: Grab bars, Raised toilet seat Social research officer, government: Moderate assistance Social research officer, government Method: Education officer, environmental: Radio broadcast assistant, Grab bars   Therapy/Group: Individual Therapy  [place] 07/16/2020, 12:49 PM

## 2020-07-16 NOTE — Progress Notes (Addendum)
[name]  Subjective/Complaints: Pain improved with Norco- he does not want any outpatient as is afraid of addiction. Discussed potential side effects of constipation- he is currently moving bowels regularly.  Improving strength in left side!  ROS:   Pt denies SOB, abd pain, CP, N/V/C/D, and vision changes, +left pec pain, right ankle pain has resolved, +right sided lower back pain- this is the worst pain today, +left sided weakness- improving!   Objective: Vital Signs: Blood pressure 137/87, pulse 78, temperature (!) 97.5 F (36.4 C), temperature source Oral, resp. rate 16, height 5\' 10"  (1.778 m), weight 105.4 kg, SpO2 99 %. DG Lumbar Spine 2-3 Views  Result Date: 07/14/2020 CLINICAL DATA:  Back pain EXAM: LUMBAR SPINE - 2-3 VIEW COMPARISON:  07/31/2017 FINDINGS: Hypoplastic ribs at T12. Stable lumbar alignment. Vertebral body heights are maintained. Mild disc space narrowing at L4-L5 and L5-S1 with small degenerative osteophytes at L2-L3 and L3-L4. IMPRESSION: Mild degenerative changes Electronically Signed   By: 10/01/2017 M.D.   On: 07/14/2020 15:58   DG HIP UNILAT WITH PELVIS 2-3 VIEWS LEFT  Result Date: 07/14/2020 CLINICAL DATA:  Left-sided hip pain EXAM: DG HIP (WITH OR WITHOUT PELVIS) 2-3V LEFT COMPARISON:  07/13/2020 FINDINGS: Pubic symphysis and rami appear intact. No fracture or malalignment. Mild to moderate degenerative change of the left hip. IMPRESSION: Degenerative changes.  No acute osseous abnormality Electronically Signed   By: 07/15/2020 M.D.   On: 07/14/2020 15:57   No results for input(s): WBC, HGB, HCT, PLT in the last 72 hours.    No results for input(s): NA, K, CL, CO2, GLUCOSE, BUN, CREATININE, CALCIUM in the last 72 hours.     Intake/Output Summary (Last 24 hours) at 07/16/2020 1125 Last data filed at 07/16/2020 0900 Gross per 24 hour  Intake 680 ml  Output 1350 ml  Net -670 ml        Physical  Exam: BP 137/87 (BP Location: Right Arm)   Pulse 78   Temp (!) 97.5 F (36.4 C) (Oral)   Resp 16   Ht 5\' 10"  (1.778 m)   Wt 105.4 kg   SpO2 99%   BMI 33.34 kg/m   Gen: no distress, normal appearing HEENT: oral mucosa pink and moist, NCAT Cardio: Reg rate Chest: normal effort, normal rate of breathing Abd: soft, non-distended Ext: no edema Psych: pleasant, normal affect Skin: intact Neuro: Cerebellar exam normal finger to nose to finger as well as heel to shin in bilateral upper and lower extremities Musculoskeletal: Full range of motion in all 4 extremities. No joint swelling- L shoulder and L hand have Ktaping on them- tenderness at left hip add origin Left pec tender to palpation Dysarthria, stable Motor: LUE: 3/5 proximal to distal LLE: HF, KE 3/5, ADF 1/5 Left inattention RUE/RLE: 5/5 proximal distal Sensation intact LT on RIght , absent on left  Can stand at bedside with assistance   Assessment/Plan: 1. Functional deficits which require 3+ hours per day of interdisciplinary therapy in a comprehensive inpatient rehab setting. Physiatrist is providing close team supervision and 24 hour management of active medical problems listed below. Physiatrist and rehab team continue to assess barriers to discharge/monitor patient progress toward functional and medical goals   Care Tool:  Bathing    Body parts bathed by patient: Chest, Abdomen, Left arm, Face, Front perineal area, Right lower leg, Right upper leg, Left upper leg, Left lower leg, Right arm (used long sponge)  Body parts bathed by helper: Right arm, Buttocks Body parts n/a: Right arm, Buttocks, Left lower leg   Bathing assist Assist Level: Minimal Assistance - Patient > 75%     Upper Body Dressing/Undressing Upper body dressing   What is the patient wearing?: Pull over shirt    Upper body assist Assist Level: Minimal Assistance - Patient > 75%    Lower Body Dressing/Undressing Lower body dressing       What is the patient wearing?: Incontinence brief, Pants     Lower body assist Assist for lower body dressing: Moderate Assistance - Patient 50 - 74%     Toileting Toileting    Toileting assist Assist for toileting: Maximal Assistance - Patient 25 - 49%     Transfers Chair/bed transfer  Transfers assist     Chair/bed transfer assist level: Minimal Assistance - Patient > 75% (squat pivot)     Locomotion Ambulation   Ambulation assist   Ambulation activity did not occur: Safety/medical concerns          Walk 10 feet activity   Assist  Walk 10 feet activity did not occur: Safety/medical concerns        Walk 50 feet activity   Assist Walk 50 feet with 2 turns activity did not occur: Safety/medical concerns         Walk 150 feet activity   Assist Walk 150 feet activity did not occur: Safety/medical concerns         Walk 10 feet on uneven surface  activity   Assist Walk 10 feet on uneven surfaces activity did not occur: Safety/medical concerns         Wheelchair     Assist Will patient use wheelchair at discharge?: Yes Type of Wheelchair: Manual    Wheelchair assist level: Minimal Assistance - Patient > 75% Max wheelchair distance: 149ft    Wheelchair 50 feet with 2 turns activity    Assist        Assist Level: Minimal Assistance - Patient > 75%   Wheelchair 150 feet activity     Assist      Assist Level: Minimal Assistance - Patient > 75%    Medical Problem List and Plan: 1.  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 Course complicated by seizure  Continue PT, OT and SLP  WHO/PRAFO nightly 2.  Impaired mobility -DVT/anticoagulation:  Pharmaceutical: Continue Lovenox             -antiplatelet therapy: N/A due to bleed 3.  Ongoing headaches/pain Management: Continue Depakote and Keppra.  Continue Topamax to 50mg  4. Situational dysthymia:  LCSW to follow for evaluation and support. Chaplain services consulted.              -antipsychotic agents: N/AA 5. Neuropsych: This patient is intermittently capable of making decisions on his own behalf. 6. Skin/Wound Care: Routine pressure-relief measures. Eucerin ordered for dry skin. Recommended wife bring coconut oil from home.  7. Fluids/Electrolytes/Nutrition: Monitor intake/output. Discussed anti-inflammatory diet, replacing ginger ale with water during the day and tart cherry juice at night, cranberry juice if he really wants juice during the day. 9.  Partial complex seizure: Continue Keppra 500 mg twice daily. 10.  HTN: Monitor blood pressures 3 times daily.              -- Labile, continue amlodipine, labetalol, losartan and clonidine             -SBP goal of <160  given ICH  6/11-6/24- BP controlled- continue regimen Vitals:   07/16/20 0516 07/16/20 0728  BP: (!) 156/94 137/87  Pulse: 72 78  Resp: 18 16  Temp: 97.8 F (36.6 C) (!) 97.5 F (36.4 C)  SpO2: 99% 99%   11.  Dyslipidemia: Continue statin. 12.  Acute kidney injury:   Resolved  13.  History of alcohol abuse/delirium: Continue Seroquel 25 mg at bedtime to help with sleep/wake cycle. Discussed this would not be long term             -- Continue folic acid and thiamine supplementation 14.  Acute blood loss anemia:   Hemoglobin 11.8 on 6/1, stable at 11.4  Continue to monitor 15.  Morbid obesity/OHS/OSA?: Outpatient sleep study recommended for work-up 16.  Post stroke dysphagia  D3 thins, advance diet as tolerated  6/22- tolerating diet- no issues- continue regimen 17. Right ankle pain: suspect sprain given tenderness over ATFL. Ice 15mg  TID. Given chronicity of pain and pain with weightbearing, ordered right ankle XR- discussed with wife that there is no evidence of fracture or athropathy. Continue voltaren gel, d/c capsaicin. Requested RN soak foot in epsom salt if available. Ankle support brace ordered and patient  wearing. Advised to elevate when resting, minimize weightbearing while with therapy to allow ligament to heal. Provided list of anti-inflammatory foods for pain. Discussed risks and benefits of various medications such as tylenol, NSAIDs, and prednisone and patient and wife decided on above conservative treatment and tylenol for now.    -improved.  18.  Urinary urgency but unable to void, suspect spastic bladder with BPH , start flomax, BP should support this med , as discussed with RN would check bladder scan as well  19. Left shoulder pain: continue lidocaine and voltaren gel. Advised wife to bring in blue emu oil at home. Discussed XR results: mild subluxation. Discussed SPRINT PNS outpatient.   20. Confusion in morning as per wife: monitor for worsening with restart of Gabapentin and tramadol.  21. Low back pain, right side: continue heating pad and add lidocaine patch. Discussed XR findings with patient- OA w/ osteophytes. Norco ordered as tramadol ineffective. Continue lidocaine patch.  22. Left groin pain: restart Gabapentin 100mg  TID- may be more muscular, stays in ext rotation at hip due to LLE weakness. XR of hip shows degeneration: discussed with patient.   23. Left pectoralis pain: lidocaine injected 6/20. Discussed benefits of Botox injection. Will discuss with wife for outpatient appointment in August. Improved.  24. Disposition: HFU scheduled with me on 09/17/20 1:00PM. FL2 form completed, wife has requested copy as well.    LOS: 24 days A FACE TO FACE EVALUATION WAS PERFORMED  September P Tahiri Shareef 07/16/2020, 11:25 AM

## 2020-07-17 LAB — URINE CULTURE

## 2020-07-17 NOTE — Progress Notes (Signed)
Chaplain responded to Spiritual Consult for support.  Chaplain established a relationship of care and concern for pt who spoke freely about how he was depressed and scared yesterday but was back on track today due to the encouragement he received from his physical therapist and due to visits from his friend and wife.  Chaplain prayed with pt at bedside.  Vernell Morgans Chaplain

## 2020-07-17 NOTE — Progress Notes (Signed)
[facility] PHYSICAL MEDICINE & REHABILITATION PROGRESS NOTE  Subjective/Complaints: Was feeling down yesterday but feels his team helped to motivate him and he is in a more positive mood today.  Still has some right sided lower back pain.  Chaplain attempted to see him yesterday but he was working with therapy  ROS:   Pt denies SOB, abd pain, CP, N/V/C/D, and vision changes, +left pec pain, right ankle pain has resolved, +right sided lower back pain- this is the worst pain today, +left sided weakness- improving!   Objective: Vital Signs: Blood pressure 124/86, pulse 82, temperature 97.9 F (36.6 C), temperature source Oral, resp. rate 15, height 5\' 10"  (1.778 m), weight 105.4 kg, SpO2 97 %. No results found. No results for input(s): WBC, HGB, HCT, PLT in the last 72 hours.    No results for input(s): NA, K, CL, CO2, GLUCOSE, BUN, CREATININE, CALCIUM in the last 72 hours.     Intake/Output Summary (Last 24 hours) at 07/17/2020 1254 Last data filed at 07/17/2020 0700 Gross per 24 hour  Intake 720 ml  Output 825 ml  Net -105 ml        Physical Exam: BP 124/86 (BP Location: Right Arm)   Pulse 82   Temp 97.9 F (36.6 C) (Oral)   Resp 15   Ht 5\' 10"  (1.778 m)   Wt 105.4 kg   SpO2 97%   BMI 33.34 kg/m   Gen: no distress, normal appearing HEENT: oral mucosa pink and moist, NCAT Cardio: Reg rate Chest: normal effort, normal rate of breathing Abd: soft, non-distended Ext: no edema Psych: pleasant, normal affect Neuro: Cerebellar exam normal finger to nose to finger as well as heel to shin in bilateral upper and lower extremities Musculoskeletal: Full range of motion in all 4 extremities. No joint swelling- L shoulder and L hand have Ktaping on them- tenderness at left hip add origin Left pec tender to palpation Dysarthria, stable Motor: LUE: 3/5 proximal to distal LLE: HF, KE 3/5, ADF 1/5 Left inattention RUE/RLE: 5/5 proximal distal Sensation intact LT on RIght ,  absent on left  Can stand at bedside with assistance   Assessment/Plan: 1. Functional deficits which require 3+ hours per day of interdisciplinary therapy in a comprehensive inpatient rehab setting. Physiatrist is providing close team supervision and 24 hour management of active medical problems listed below. Physiatrist and rehab team continue to assess barriers to discharge/monitor patient progress toward functional and medical goals   Care Tool:  Bathing    Body parts bathed by patient: Chest, Abdomen, Left arm, Face, Front perineal area, Right lower leg, Right upper leg, Left upper leg, Left lower leg, Right arm (with use of long sponge)   Body parts bathed by helper: Right arm, Buttocks Body parts n/a: Right arm, Buttocks, Left lower leg   Bathing assist Assist Level: Minimal Assistance - Patient > 75%     Upper Body Dressing/Undressing Upper body dressing   What is the patient wearing?: Pull over shirt    Upper body assist Assist Level: Contact Guard/Touching assist    Lower Body Dressing/Undressing Lower body dressing      What is the patient wearing?: Incontinence brief, Pants     Lower body assist Assist for lower body dressing: Minimal Assistance - Patient > 75%     Toileting Toileting    Toileting assist Assist for toileting: Maximal Assistance - Patient 25 - 49%     Transfers Chair/bed transfer  Transfers assist  Chair/bed transfer assist level: Contact Guard/Touching assist     Locomotion Ambulation   Ambulation assist   Ambulation activity did not occur: Safety/medical concerns          Walk 10 feet activity   Assist  Walk 10 feet activity did not occur: Safety/medical concerns        Walk 50 feet activity   Assist Walk 50 feet with 2 turns activity did not occur: Safety/medical concerns         Walk 150 feet activity   Assist Walk 150 feet activity did not occur: Safety/medical concerns         Walk 10 feet  on uneven surface  activity   Assist Walk 10 feet on uneven surfaces activity did not occur: Safety/medical concerns         Wheelchair     Assist Will patient use wheelchair at discharge?: Yes Type of Wheelchair: Manual    Wheelchair assist level: Minimal Assistance - Patient > 75% Max wheelchair distance: 147ft    Wheelchair 50 feet with 2 turns activity    Assist        Assist Level: Minimal Assistance - Patient > 75%   Wheelchair 150 feet activity     Assist      Assist Level: Minimal Assistance - Patient > 75%    Medical Problem List and Plan: 1.  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 Course complicated by seizure  Continue PT, OT and SLP  WHO/PRAFO nightly 2.  Impaired mobility -DVT/anticoagulation:  Pharmaceutical: Continue Lovenox             -antiplatelet therapy: N/A due to bleed 3.  Ongoing headaches/pain Management: Continue Depakote and Keppra.  Continue Topamax to 50mg  4. Situational dysthymia: LCSW to follow for evaluation and support. Chaplain services consulted.              -antipsychotic agents: N/AA 5. Neuropsych: This patient is intermittently capable of making decisions on his own behalf. 6. Skin/Wound Care: Routine pressure-relief measures. Eucerin ordered for dry skin. Recommended wife bring coconut oil from home.  7. Fluids/Electrolytes/Nutrition: Monitor intake/output. Discussed anti-inflammatory diet, replacing ginger ale with water during the day and tart cherry juice at night, cranberry juice if he really wants juice during the day. 9.  Partial complex seizure: Continue Keppra 500 mg twice daily. 10.  HTN: Monitor blood pressures 3 times daily.              -- Labile, continue amlodipine, labetalol, losartan and clonidine             -SBP goal of <160 given ICH  6/11-6/25- BP controlled- continue regimen Vitals:   07/17/20 0424 07/17/20 0800  BP:  124/70 124/86  Pulse: 80 82  Resp: 18 15  Temp: 97.6 F (36.4 C) 97.9 F (36.6 C)  SpO2: 97% 97%   11.  Dyslipidemia: Continue statin. 12.  Acute kidney injury:   Resolved  13.  History of alcohol abuse/delirium: Continue Seroquel 25 mg at bedtime to help with sleep/wake cycle. Discussed this would not be long term             -- Continue folic acid and thiamine supplementation 14.  Acute blood loss anemia:   Hemoglobin 11.8 on 6/1, stable at 11.4  Continue to monitor 15.  Morbid obesity/OHS/OSA?: Outpatient sleep study recommended for work-up 16.  Post stroke dysphagia  D3 thins, advance diet as tolerated  6/22- tolerating  diet- no issues- continue regimen 17. Right ankle pain: suspect sprain given tenderness over ATFL. Ice 15mg  TID. Given chronicity of pain and pain with weightbearing, ordered right ankle XR- discussed with wife that there is no evidence of fracture or athropathy. Continue voltaren gel, d/c capsaicin. Requested RN soak foot in epsom salt if available. Ankle support brace ordered and patient wearing. Advised to elevate when resting, minimize weightbearing while with therapy to allow ligament to heal. Provided list of anti-inflammatory foods for pain. Discussed risks and benefits of various medications such as tylenol, NSAIDs, and prednisone and patient and wife decided on above conservative treatment and tylenol for now.    -improved.  18.  Urinary urgency but unable to void, suspect spastic bladder with BPH , start flomax, BP should support this med , as discussed with RN would check bladder scan as well  19. Left shoulder pain: continue lidocaine and voltaren gel. Advised wife to bring in blue emu oil at home. Discussed XR results: mild subluxation. Discussed [facility] P[facility] outpatient.   20. Confusion in morning as per wife: monitor for worsening with restart of Gabapentin and tramadol.  21. Low back pain, right side: continue heating pad and add lidocaine patch. Discussed XR  findings with patient- OA w/ osteophytes. Norco ordered as tramadol ineffective. Improved. Educated regarding side effects of Norco. Continue lidocaine patch.  22. Left groin pain: restart Gabapentin 100mg  TID- may be more muscular, stays in ext rotation at hip due to LLE weakness. XR of hip shows degeneration: discussed with patient.   23. Left pectoralis pain: lidocaine injected 6/20. Discussed benefits of Botox injection. Will discuss with wife for outpatient appointment in August. Improved.  24. Disposition: HFU scheduled with me on 09/17/20 1:00PM. FL2 form completed, wife has requested copy as well.    LOS: 25 days A FACE TO FACE EVALUATION WAS PERFORMED  [name] 07/17/2020, 12:54 PM

## 2020-07-17 NOTE — Progress Notes (Signed)
Speech Language Pathology Daily Session Note  Patient Details  Name: [name] MRN: [id number] Date of Birth: Jul 21, 1965  Today's Date: 07/17/2020 SLP Individual Time: 1431-1500 SLP Individual Time Calculation (min): 29 min  Short Term Goals: Week 4: SLP Short Term Goal 1 (Week 4): Pt will demonstrate selective attention for 30-45 minutes in mildly distracting environments with supervision A verbal cues. SLP Short Term Goal 2 (Week 4): Pt will demonstrate mildly complex problem solving skills in functional tasks with supervision A verbal cues. SLP Short Term Goal 3 (Week 4): Pt will demonstrate self-monitoring and self-correction of errors in problem solving tasks with Supervision A verbal cues. SLP Short Term Goal 4 (Week 4): Patient will recall new, daily information with supervision verbal and visual cues  Skilled Therapeutic Interventions:  Pt seen for skilled ST with focus on cognitive goals. Pt states he was having a bad day yesterday but feeling more optimistic today. Pt endorses apprehension with upcoming discharge, provided education on progress toward goals of therapy and continued use of trained strategies (write things down to increase recall, utilize calendar, set goals). SLP facilitating moderately complex problem solving skills providing min fading to supervision level assist. Pt states his short term memory remains the most impaired following CVA. Pt left in bed with alarm set and cold drink as requested. Cont ST POC.   Pain Pain Assessment Pain Scale: 0-10 Pain Score: 0-No pain  Therapy/Group: Individual Therapy  Tacey Ruiz 07/17/2020, 3:01 PM

## 2020-07-18 NOTE — Progress Notes (Addendum)
Occupational Therapy Session Note  Patient Details  Name: [name] MRN: [id number] Date of Birth: [birth year]  Today's Date: 07/18/2020 OT Individual Time: 0903-1001 and 2706-2376 OT Individual Time Calculation (min): 58 min and 43 min  Skilled Therapeutic Interventions/Progress Updates:    Pt greeted while sitting on the toilet in [place], in care of NT. Pt in the middle of having a BM. Continued using the [facility] for toileting tasks due to increase in back pain. Multiple attempts for pt to elevate his Lt hand onto the [facility] bar and pt ultimately needing assistance for this. Tactile and verbal cues for Lt terminal knee extension while standing in lift. Afterwards pt transferred to EOB to don new shorts. Multiple attempts for pt to thread his Lt LE into his pants with pt needing assistance for threading both LEs into pants. Supervision for sit<stand in [place] to pull up pants. Pt with very limited standing endurance due to pain. Pt provided oral and topical pain medicine during session from nursing but before RN arrived we placed k-pad onto lower back with lavender aromatherapy. Continued to address pain management holistically while pt participated in "dancing" tasks while listening to meaningful music, emphasis placed on gentle mobility including trunk rotations, lateral sways, and forward/backward weight shifts. At end of session he completed a squat pivot<w/c with Min A. Left pt with all needs within reach and safety belt fastened, half lap tray applied to the w/c.   2nd Session 1:1 tx (43 min) Pt greeted while EOB, NT assisting him with bathing/dressing UB. Spouse Sherrill also present. OT assisted pt wash his Rt underarm via hand over hand, pt able to proximally active his affected arm to assist. Vcs for mindful breathing, particularly breathing out with exertion due to breath holding. Min cues for hemi strategies during UB dressing, pt able to don his shirt with CGA once OT first oriented it in  lap. Pt then c/o increase in his back pain, also new onset of hip pain. He needed to change his shorts due to incontinent episode in brief. Lateral scooting up towards [facility] completed with Sherill assisting pt. Vcs for blocking the Lt knee and for assisting him with forward weight shifting only. +2 for sit<supine due to pain. Hands on caregiver training with functional bed mobility during perihygiene, brief change, and donning new shorts. Discussed joint protection techniques, mechanics during rolling/bridging, and using barrier creams for skin integrity. Also advised using hemi positioning strategies at home (handout on his bulletin board). Assisted pt in sidelying position at close of session, pt reported this was really helping his back pain. At end of tx pt remained in bed with all needs within reach and bed alarm set. Tx focus placed on caregiver education via active observation and hands on learning.  Therapy Documentation Precautions:  Precautions Precautions: Fall Precaution Comments: Lt hemi with right gaze preference Restrictions Weight Bearing Restrictions: No  Pain: Pain Assessment Pain Scale: 0-10 Pain Score: 8  Faces Pain Scale: No hurt Pain Type: Acute pain Pain Location: Back Pain Orientation: Right Pain Descriptors / Indicators: Aching Pain Frequency: Occasional Pain Onset: With Activity Pain Intervention(s): Repositioned (pt will call nurse if repostioning does not help) ADL: ADL Eating: Set up Grooming: Setup Where Assessed-Grooming: Sitting at sink Upper Body Bathing: Supervision/safety Where Assessed-Upper Body Bathing: Shower Lower Body Bathing: Minimal assistance Where Assessed-Lower Body Bathing: Shower Upper Body Dressing: Contact guard Where Assessed-Upper Body Dressing: Edge of bed Lower Body Dressing: Minimal assistance Where Assessed-Lower Body Dressing: Edge of  bed Toileting: Moderate assistance Where Assessed-Toileting: Bedside Commode, Toilet Toilet  Transfer: Minimal assistance Toilet Transfer Method: Squat pivot Toilet Transfer Equipment: Grab bars, Raised toilet seat Film/video editor: Moderate assistance Film/video editor Method: Administrator: Emergency planning/management officer, Grab bars     Therapy/Group: Individual Therapy  Kelda Azad A Sherwin Hollingshed 07/18/2020, 12:18 PM

## 2020-07-19 DIAGNOSIS — M545 Low back pain, unspecified: Secondary | ICD-10-CM

## 2020-07-19 LAB — BASIC METABOLIC PANEL
Anion gap: 8 (ref 5–15)
BUN: 14 mg/dL (ref 6–20)
CO2: 22 mmol/L (ref 22–32)
Calcium: 9.4 mg/dL (ref 8.9–10.3)
Chloride: 108 mmol/L (ref 98–111)
Creatinine, Ser: 1.12 mg/dL (ref 0.61–1.24)
GFR, Estimated: >60 mL/min (ref >60)
Glucose, Bld: 109 mg/dL — ABNORMAL HIGH (ref 70–99)
Potassium: 3.8 mmol/L (ref 3.5–5.1)
Sodium: 138 mmol/L (ref 135–145)

## 2020-07-19 LAB — URINALYSIS, ROUTINE W REFLEX MICROSCOPIC
Bilirubin Urine: NEGATIVE
Glucose, UA: NEGATIVE mg/dL
Hgb urine dipstick: NEGATIVE
Ketones, ur: NEGATIVE mg/dL
Leukocytes,Ua: NEGATIVE
Nitrite: NEGATIVE
Protein, ur: NEGATIVE mg/dL
Specific Gravity, Urine: 1.012 (ref 1.005–1.030)
pH: 7 (ref 5.0–8.0)

## 2020-07-19 LAB — CBC
HCT: 34.1 % — ABNORMAL LOW (ref 39.0–52.0)
Hemoglobin: 11.4 g/dL — ABNORMAL LOW (ref 13.0–17.0)
MCH: 31.6 pg (ref 26.0–34.0)
MCHC: 33.4 g/dL (ref 30.0–36.0)
MCV: 94.5 fL (ref 80.0–100.0)
Platelets: 257 10*3/uL (ref 150–400)
RBC: 3.61 MIL/uL — ABNORMAL LOW (ref 4.22–5.81)
RDW: 12 % (ref 11.5–15.5)
WBC: 6 10*3/uL (ref 4.0–10.5)
nRBC: 0 % (ref 0.0–0.2)

## 2020-07-19 MED ORDER — HYDROCODONE-ACETAMINOPHEN 5-325 MG PO TABS
1.0000 | ORAL_TABLET | Freq: Two times a day (BID) | ORAL | Status: DC
  Administered 2020-07-19 – 2020-07-21 (×5): 1 via ORAL
  Filled 2020-07-19 (×5): qty 1

## 2020-07-19 MED ORDER — TIZANIDINE HCL 2 MG PO TABS
2.0000 mg | ORAL_TABLET | Freq: Three times a day (TID) | ORAL | Status: DC
  Administered 2020-07-19 – 2020-07-21 (×6): 2 mg via ORAL
  Filled 2020-07-19 (×7): qty 1

## 2020-07-19 NOTE — Progress Notes (Signed)
Physical Therapy Session Note  Patient Details  Name: [name] MRN: [id number] Date of Birth: 1965/07/11  Today's Date: 07/19/2020 PT Individual Time: 1100-1205 PT Individual Time Calculation (min): 65 min   Short Term Goals: Week 4:  PT Short Term Goal 1 (Week 4): STG = LTG due to ELOS  Skilled Therapeutic Interventions/Progress Updates:    Pt greeted in semi-sidelying position at start of session. Wife, [name], at bedside. Pt agreeable to PT session but reports 8/10 R lower back pain. Medical team is aware and pt recently has received pain medication and patch for relief. Pt requests not to get OOB due to the pain but with convincing he was agreeable. Focused session on family education and training. Pt's DME has been delivered to room. Needed to adjust w/c to hemi-height position to allow pt to self propel himself in w/c. While doing this, spoke with wife on family ed such as home safety, role of f/u therapies, etc. Pt's wife appears quite anxious with preparing for home. Supine<>sit with maxA with HOB flat - increased assist needed compared to previous session due to back pain. He's able to sit unsupported with supervision and defer's doing a squat<>pivot transfer and requests to use Stedy due to this pain. Requires minA for rising to stand in [address] and then transferred to w/c with Stedy - pt c/o worsening back pain that "shoots" into his R leg - calms with deep breathing and sitting with back supported in w/c. Focused remainder of session on hands on training with curb training in w/c as pt has 1 STE home (resembles a curb). PT proivided demonstration of navigating curb in w/c via bump-up technique (forward facing for ascent and backward facing for descent). Wife needed ed on technique and general sequencing but she completed a total of x4 repetitions and her confidence and technique improved with each rep. Pt was returned to his room and remained seated in w/c at end of session. 1/2 lap tray  provided for LUE approximation. All needs met and all questions addressed. Encouraged Wife to come tomorrow for more family training.  Therapy Documentation Precautions:  Precautions Precautions: Fall Precaution Comments: Lt hemi with right gaze preference Restrictions Weight Bearing Restrictions: No General:    Therapy/Group: Individual Therapy  [name] 07/19/2020, 7:48 AM

## 2020-07-19 NOTE — Progress Notes (Signed)
Patient ID: [name], male   DOB: 1965/02/10, 55 y.o.   MRN: [id number]  Met with pt and wife to answer questions regarding discharge Wed. Wife is aware of the care pt will need at discharge. Pt's back is flaring up today. Answered questions regarding PCP and home health questions.

## 2020-07-19 NOTE — Progress Notes (Signed)
Pt educated on need for urine specimen to rule out infectio. Pt unable to produce urine. Nurse stated cath would need to be performed to obtain specimen, pt decline. Charge  nurse updated.

## 2020-07-19 NOTE — Progress Notes (Addendum)
[place] PHYSICAL MEDICINE & REHABILITATION PROGRESS NOTE  Subjective/Complaints:  Having significant mid low back pain. Recent regimen adjustment hasn't helped  ROS: Patient denies fever, rash, sore throat, blurred vision, nausea, vomiting, diarrhea, cough, shortness of breath or chest pain,   headache, or mood change.    Objective: Vital Signs: Blood pressure 135/75, pulse 77, temperature 97.7 F (36.5 C), resp. rate 19, height 5\' 10"  (1.778 m), weight 105.4 kg, SpO2 97 %. No results found. Recent Labs    07/19/20 0530  WBC 6.0  HGB 11.4*  HCT 34.1*  PLT 257      Recent Labs    07/19/20 0530  NA 138  K 3.8  CL 108  CO2 22  GLUCOSE 109*  BUN 14  CREATININE 1.12  CALCIUM 9.4       Intake/Output Summary (Last 24 hours) at 07/19/2020 0920 Last data filed at 07/19/2020 0813 Gross per 24 hour  Intake 240 ml  Output 1590 ml  Net -1350 ml        Physical Exam: BP 135/75 (BP Location: Right Arm)   Pulse 77   Temp 97.7 F (36.5 C)   Resp 19   Ht 5\' 10"  (1.778 m)   Wt 105.4 kg   SpO2 97%   BMI 33.34 kg/m   Constitutional: No distress . Vital signs reviewed. HEENT: EOMI, oral membranes moist Neck: supple Cardiovascular: RRR without murmur. No JVD    Respiratory/Chest: CTA Bilaterally without wheezes or rales. Normal effort    GI/Abdomen: BS +, non-tender, non-distended Ext: no clubbing, cyanosis, or edema Psych: pleasant and cooperative Neuro: Cerebellar exam normal finger to nose to finger as well as heel to shin in bilateral upper and lower extremities Dysarthria, stable Motor: LUE: 3/5 proximal to distal LLE: HF, KE 3/5, ADF 1/5 Left inattention RUE/RLE: 5/5 proximal distal Sensation intact LT on RIght , absent on left  Musc: mid low back pain at belt line. Worst with movement, SLR. No referral   Assessment/Plan: 1. Functional deficits which require 3+ hours per day of interdisciplinary therapy in a comprehensive inpatient rehab  setting. Physiatrist is providing close team supervision and 24 hour management of active medical problems listed below. Physiatrist and rehab team continue to assess barriers to discharge/monitor patient progress toward functional and medical goals   Care Tool:  Bathing    Body parts bathed by patient: Chest, Abdomen, Left arm, Face, Front perineal area, Right lower leg, Right upper leg, Left upper leg, Left lower leg, Right arm (with use of long sponge)   Body parts bathed by helper: Right arm, Buttocks Body parts n/a: Right arm, Buttocks, Left lower leg   Bathing assist Assist Level: Minimal Assistance - Patient > 75%     Upper Body Dressing/Undressing Upper body dressing   What is the patient wearing?: Pull over shirt    Upper body assist Assist Level: Contact Guard/Touching assist    Lower Body Dressing/Undressing Lower body dressing      What is the patient wearing?: Incontinence brief, Pants     Lower body assist Assist for lower body dressing: Minimal Assistance - Patient > 75%     Toileting Toileting    Toileting assist Assist for toileting: Maximal Assistance - Patient 25 - 49%     Transfers Chair/bed transfer  Transfers assist     Chair/bed transfer assist level: Contact Guard/Touching assist     Locomotion Ambulation   Ambulation assist   Ambulation activity did not occur: Safety/medical concerns  Walk 10 feet activity   Assist  Walk 10 feet activity did not occur: Safety/medical concerns        Walk 50 feet activity   Assist Walk 50 feet with 2 turns activity did not occur: Safety/medical concerns         Walk 150 feet activity   Assist Walk 150 feet activity did not occur: Safety/medical concerns         Walk 10 feet on uneven surface  activity   Assist Walk 10 feet on uneven surfaces activity did not occur: Safety/medical concerns         Wheelchair     Assist Will patient use wheelchair at  discharge?: Yes Type of Wheelchair: Manual    Wheelchair assist level: Minimal Assistance - Patient > 75% Max wheelchair distance: 13ft    Wheelchair 50 feet with 2 turns activity    Assist        Assist Level: Minimal Assistance - Patient > 75%   Wheelchair 150 feet activity     Assist      Assist Level: Minimal Assistance - Patient > 75%    Medical Problem List and Plan: 1.  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 Course complicated by seizure  Continue PT, OT and SLP  WHO/PRAFO nightly 2.  Impaired mobility -DVT/anticoagulation:  Pharmaceutical: Continue Lovenox             -antiplatelet therapy: N/A due to bleed 3.  Ongoing headaches/pain Management: Continue Depakote and Keppra.  Continue Topamax to 50mg  4. Situational dysthymia: LCSW to follow for evaluation and support. Chaplain services consulted.              -antipsychotic agents: N/AA 5. Neuropsych: This patient is intermittently capable of making decisions on his own behalf. 6. Skin/Wound Care: Routine pressure-relief measures. Eucerin ordered for dry skin. Recommended wife bring coconut oil from home.  7. Fluids/Electrolytes/Nutrition: Monitor intake/output. Discussed anti-inflammatory diet, replacing ginger ale with water during the day and tart cherry juice at night, cranberry juice if he really wants juice during the day.  6/27 I personally reviewed the patient's labs today.  WNL 9.  Partial complex seizure: Continue Keppra 500 mg twice daily. 10.  HTN: Monitor blood pressures 3 times daily.              -- Labile, continue amlodipine, labetalol, losartan and clonidine             -SBP goal of <160 given ICH  6/11-6/27- BP controlled- continue regimen Vitals:   07/18/20 2230 07/19/20 0512  BP: 118/71 135/75  Pulse: 85 77  Resp:  19  Temp:  97.7 F (36.5 C)  SpO2:  97%   11.  Dyslipidemia: Continue statin. 12.  Acute  kidney injury:   Resolved  13.  History of alcohol abuse/delirium: Continue Seroquel 25 mg at bedtime to help with sleep/wake cycle. Discussed this would not be long term             -- Continue folic acid and thiamine supplementation 14.  Acute blood loss anemia:   Hemoglobin 11.8 on 6/1, stable at 11.4 6/27  Continue to monitor 15.  Morbid obesity/OHS/OSA?: Outpatient sleep study recommended for work-up 16.  Post stroke dysphagia  D3 thins, advance diet as tolerated  6/22- tolerating diet- no issues- continue regimen 17. Right ankle pain: suspect sprain given tenderness over ATFL. Ice 15mg  TID. Given chronicity of pain and pain with weightbearing,  ordered right ankle XR- discussed with wife that there is no evidence of fracture or athropathy. Continue voltaren gel, d/c capsaicin. Requested RN soak foot in epsom salt if available. Ankle support brace ordered and patient wearing. Advised to elevate when resting, minimize weightbearing while with therapy to allow ligament to heal. Provided list of anti-inflammatory foods for pain. Discussed risks and benefits of various medications such as tylenol, NSAIDs, and prednisone and patient and wife decided on above conservative treatment and tylenol for now.    -improved.  18.  Urinary urgency but unable to void, suspect spastic bladder with BPH , start flomax, BP should support this med , as discussed with RN would check bladder scan as well  19. Left shoulder pain: continue lidocaine and voltaren gel. Advised wife to bring in blue emu oil at home. Discussed XR results: mild subluxation. Discussed SPRINT PNS outpatient.  Shoulder support, ROM advised 20. Confusion in morning as per wife: monitor for worsening with restart of Gabapentin and tramadol.  21. Low back pain, right side: continue heating pad and add lidocaine patch. Discussed XR findings with patient- OA w/ osteophytes. Norco ordered as tramadol ineffective.  6/27 pain more central, +/-  right-side. Lumbar spondylosis on xr  -trial of tizanidine 2mg  tid, hold clonidine if bp drops too low  -continue above regimen as well 22. Left groin pain: restart Gabapentin 100mg  TID- may be more muscular, stays in ext rotation at hip due to LLE weakness. XR of hip shows degeneration: discussed with patient.   23. Left pectoralis pain: lidocaine injected 6/20. Discussed benefits of Botox injection. Will discuss with wife for outpatient appointment in August. Improved.  24. Disposition: HFU scheduled with me on 09/17/20 1:00PM. FL2 form completed, wife has requested copy as well.    LOS: 27 days A FACE TO FACE EVALUATION WAS PERFORMED  September 07/19/2020, 9:20 AM

## 2020-07-19 NOTE — Progress Notes (Signed)
Pt c/o 9/10 pain to right side of back shooting down right leg. Pt stated when transferred earlier it "felt like my disc slipped". Nurse provided PRN medications, PA updated.

## 2020-07-19 NOTE — Progress Notes (Signed)
Occupational Therapy Session Note  Patient Details  Name: [name] MRN: [id number] Date of Birth: 02-Jun-1965  Today's Date: 07/19/2020 OT Individual Time: 0905-1005; 1410-1445  OT Individual Time Calculation (min): 60 min ; and 35 min   Short Term Goals: Week 4:  OT Short Term Goal 1 (Week 4): STGs = LTGs   Skilled Therapeutic Interventions/Progress Updates:    First session:  Pt semiupright in bed, with wife at bedside, just finishing with CIR MD.  Pt c/o 8/10 pain right low back region with MD and nurse made aware.  Pt reports he would like to reposition on his left side to reduce pain and discomfort.  Educated pts wife on safe body mechanics and mobility techniques to assist pt in rolling to left side and positioning of BUE and BLE using pillows to support.  Pt required mod assist today to roll and reposition on left side due to pain.   Pts wife requesting further training on squat pivot functional transfer techniques as well as toileting assist techniques.  Educated pts wife toilet transfers, clothing mgt, and pericare with simulation using therapist acting as pt in order to allow wife to block practice techniques.  (Due to pt reports pain level too high to perform any mobility at this time).  Pts wife required max step by step Vcs then fading to min Vcs only for sequencing and body mechanics while assisting therapist in simulated toileting and toilet squat pivot transfers.  Direct hand off to PA, Pam, at end of session. Pt and wife would benefit from further training and practice of toileting/toilet transfer to ensure safety and independence.  Second session: Pt sitting up in w/c, just received pain medication from nurse.  Reports low back pain is still 8/10 and would like to stay in current position to avoid aggravating further.  Focus of session on neuro re-ed and strengthening of LUE.  Assessed MMT LUE:  Shoulder elevation, abduction, and forward flexion 2/5; elbow flexion/extension  3-/5; forearm supination 2/5, pronation 3/5, wrist flex/ext 3/5, digital flexion 3/5, extension 2/5.  Pt trained in functional reach/grasp/place tasks using LUE.  Therapist provided intermittent and partial manual support of LUE and tactile/verbal cues to recruit desired musculature and functional movement pattern.  Pt grasped empty cup with straw and brought to mouth then placed back on table.  Pt then grasped deodorent and simulated applying to right under arm and then placed back on table.  Pt completed these tasks each x 3.  Pt demonstrated improved ability to grasp and hold medium light objects, however still using segmented/isolated movements to complete entire task.  Therapy Documentation Precautions:  Precautions Precautions: Fall Precaution Comments: Lt hemi with right gaze preference Restrictions Weight Bearing Restrictions: No   Therapy/Group: Individual Therapy  Amie Critchley 07/19/2020, 11:50 AM

## 2020-07-19 NOTE — Progress Notes (Signed)
U/A and culture obtained and sent to lab per ordered -clean catch specimen

## 2020-07-19 NOTE — Progress Notes (Signed)
Speech Language Pathology Daily Session Note  Patient Details  Name: [name] MRN: [id number] Date of Birth: May 12, 1965  Today's Date: 07/19/2020 SLP Individual Time: 1000-1100 SLP Individual Time Calculation (min): 60 min  Short Term Goals: Week 4: SLP Short Term Goal 1 (Week 4): Pt will demonstrate selective attention for 30-45 minutes in mildly distracting environments with supervision A verbal cues. SLP Short Term Goal 2 (Week 4): Pt will demonstrate mildly complex problem solving skills in functional tasks with supervision A verbal cues. SLP Short Term Goal 3 (Week 4): Pt will demonstrate self-monitoring and self-correction of errors in problem solving tasks with Supervision A verbal cues. SLP Short Term Goal 4 (Week 4): Patient will recall new, daily information with supervision verbal and visual cues  Skilled Therapeutic Interventions: Skilled ST intervention performed with focus on cognition and family education with patient's spouse. Provided education on patient's cognition and swallowing function, progress made during ST treatment, results of cognitive assessment/re-assessment, and recommendations for discharge. Spouse had particular questions about medication management. Provided patient and spouse with copies of medication charts, and wrote down patient's medications on chart for reference. Informed spouse that medications are subject to change and current list is to be used for example purposes on how to use charts for home use. Spouse appreciative of the resources, Facilitated patient in problem solving with medication management using 3x/day pillbox with min-to-mod A verbal and visual cues for processing and identification of errors. All questions were addressed.  Patient requires supervision-to-min A for cognition including problem solving, memory, and attention. Recommend HH SLP for continued SLP services. Patient was left in bed with alarm activated and needs within reach.  Continue per ST POC.     Pain Pain Assessment Pain Scale: 0-10 Pain Score: 6  Faces Pain Scale: No hurt Pain Type: Acute pain Pain Location: Back Pain Orientation: Right Pain Radiating Towards: down right leg Pain Descriptors / Indicators: Aching Pain Frequency: Constant Pain Onset: With Activity Patients Stated Pain Goal: 2 Pain Intervention(s): Medication (See eMAR);RN made aware  Therapy/Group: Individual Therapy  Tamala Ser 07/19/2020, 12:53 PM

## 2020-07-20 MED ORDER — POTASSIUM CHLORIDE 20 MEQ PO PACK
20.0000 meq | PACK | Freq: Once | ORAL | Status: AC
  Administered 2020-07-20: 20 meq via ORAL
  Filled 2020-07-20: qty 1

## 2020-07-20 NOTE — Progress Notes (Signed)
Patient ID: [name], male   DOB: 1965-05-15, 55 y.o.   MRN: [id number] Wife and pt aware plan for discharge tomorrow will have friend with [name] service come to pick up tomorrow at 11:00. Wife to be here to go over DC instructions with PA. Aware of team conference update and plan. Concerns regarding not enough hands on education with wife due to pt's back pain and her not coming in due to work schedule, although encouraged to numerous times.

## 2020-07-20 NOTE — Progress Notes (Signed)
Pt continues to c/o pain to right lower back that radiates down right leg. PRN meds given at 0557. Pt requesting pain meds at 0743. Nurse offered PRN tylenol or scheduled Norco, Pt requested tylenol. Pt  continued to c/o of r lower back pain and requested to scheduled Norco. Nurse auscultated expiratory wheezing in BIL upper lungs. Nurse educated pt cough and deep breathing and use of incentive spirometry. Pt reached 1000 on first attempt.

## 2020-07-20 NOTE — Discharge Summary (Signed)
Physician Discharge Summary  Patient ID: [name] MRN: [id number] DOB/AGE: [id number] y.o.  Admit date: 06/22/2020 Discharge date: 07/21/2020  Discharge Diagnoses:  Principal Problem:   Thalamic hemorrhage ([place]) Active Problems:   Acute blood loss anemia   AKI (acute kidney injury) ([place])   Essential hypertension   Seizures (HCC)   Vascular headache   Dysphagia, post-stroke   Left hemiplegia (HCC)   Cognitive deficit due to recent stroke   Discharged Condition: stable  Significant Diagnostic Studies: DG Lumbar Spine 2-3 Views  Result Date: 07/14/2020 CLINICAL DATA:  Back pain EXAM: LUMBAR SPINE - 2-3 VIEW COMPARISON:  07/31/2017 FINDINGS: Hypoplastic ribs at T12. Stable lumbar alignment. Vertebral body heights are maintained. Mild disc space narrowing at L4-L5 and L5-S1 with small degenerative osteophytes at L2-L3 and L3-L4. IMPRESSION: Mild degenerative changes Electronically Signed   By: Donavan Foil M.D.   On: 07/14/2020 15:58   DG Shoulder 1V Left  Result Date: 07/05/2020 CLINICAL DATA:  Left shoulder pain EXAM: LEFT SHOULDER COMPARISON:  None. FINDINGS: Degenerative changes of the acromioclavicular joint are noted. The humeral head is slightly subluxed inferiorly although not truly dislocated. Soft tissues fall. Left chest wall is unremarkable as well. IMPRESSION: No acute bony abnormality is noted. Mild subluxation of the humeral head is seen. Electronically Signed   By: Inez Catalina M.D.   On: 07/05/2020 16:02   DG Ankle 2 Views Right  Result Date: 06/26/2020 CLINICAL DATA:  Pain EXAM: RIGHT ANKLE - 2 VIEW COMPARISON:  None. FINDINGS: Frontal and lateral views were obtained. No fracture or joint effusion. No appreciable joint space narrowing or erosion. Ankle mortise appears intact. IMPRESSION: No fracture or appreciable arthropathy. Ankle mortise appears intact. Electronically Signed   By: Lowella Grip [name] M.D.   On: 06/26/2020 14:31   DG HIP UNILAT WITH PELVIS  2-3 VIEWS LEFT  Result Date: 07/14/2020 CLINICAL DATA:  Left-sided hip pain EXAM: DG HIP (WITH OR WITHOUT PELVIS) 2-3V LEFT COMPARISON:  07/13/2020 FINDINGS: Pubic symphysis and rami appear intact. No fracture or malalignment. Mild to moderate degenerative change of the left hip. IMPRESSION: Degenerative changes.  No acute osseous abnormality Electronically Signed   By: Donavan Foil M.D.   On: 07/14/2020 15:57   DG HIP UNILAT WITH PELVIS 2-3 VIEWS RIGHT  Result Date: 07/13/2020 CLINICAL DATA:  Right hip pain EXAM: DG HIP (WITH OR WITHOUT PELVIS) 3V RIGHT COMPARISON:  None. FINDINGS: Pelvic ring is intact. Degenerative changes of the hip joints are noted. No acute fracture or dislocation is noted. No soft tissue abnormality is seen. IMPRESSION: Degenerative change without acute abnormality. Electronically Signed   By: Inez Catalina M.D.   On: 07/13/2020 17:44    Labs:  Basic Metabolic Panel: BMP Latest Ref Rng & Units 07/19/2020 07/12/2020 07/05/2020  Glucose 70 - 99 mg/dL 109(H) 97 94  BUN 6 - 20 mg/dL 14 16 11   Creatinine 0.61 - 1.24 mg/dL 1.12 1.24 1.15  Sodium 135 - 145 mmol/L 138 140 140  Potassium 3.5 - 5.1 mmol/L 3.8 3.9 3.9  Chloride 98 - 111 mmol/L 108 105 106  CO2 22 - 32 mmol/L 22 26 24   Calcium 8.9 - 10.3 mg/dL 9.4 9.7 9.6     CBC: CBC Latest Ref Rng & Units 07/19/2020 07/12/2020 07/05/2020  WBC 4.0 - 10.5 K/uL 6.0 5.2 5.2  Hemoglobin 13.0 - 17.0 g/dL 11.4(L) 11.2(L) 11.4(L)  Hematocrit 39.0 - 52.0 % 34.1(L) 35.6(L) 34.3(L)  Platelets 150 - 400 K/uL 257 267  329     CBG: No results for input(s): GLUCAP in the last 168 hours.  Brief HPI:   [name] is a 55 y.o. male with history of obesity, HTN, ICM, chronic combined CHF was admitted on 06/08/2020 after wife found patient in bathroom with right gaze preference, left-sided weakness and left-sided numbness.  Blood pressure was elevated at admission and CT head showed right thalamic hemorrhage with extension into ventricles.  He  was started on hypertonic saline and required no breather mask due to lethargy with snoring NAD.  MRI/MRA brain done and was negative for vascular malformation, occlusion or significant stenosis.  MRI brain showed no substantial change in size of hemorrhage and stable leftward shift.  Hospital course has been significant for bouts of agitation felt to be due to alcohol withdrawal and was treated with CIWA protocol.    He continued to have issues with significant headaches and valproic acid added for management.  On 05/27 he was found to have decreasing level of consciousness with LT EEG showing generalized lateralization right hemisphere without definite epileptiform discharges and low dose keppra added due to concerns of partial complex seizure.  Other issues include AKI with hyperkalemia as well as hyponatremia, dysphagia requiring modified diet as well as issues with headaches.  Patient with dense left hemiplegia with right gaze preference, fatigue as well as visual deficits.  CIR was recommended due to functional decline.    Hospital Course: [name] was admitted to rehab 06/22/2020 for inpatient therapies to consist of PT, ST and OT at least three hours five days a week. Past admission physiatrist, therapy team and rehab RN have worked together to provide customized collaborative inpatient rehab.  His blood pressures were monitored on TID basis and shows good control at this time.  He continues on Keppra twice daily and has been seizure-free during his stay.  He continued to report issues with headaches and was noted to have lethargy therefore Depakote was DC'd and Topamax was added with good results.  Seroquel was added to help with sleep-wake disruption.  He did sustain right ankle sprain that was treated with local measures as well as splinting.  X-ray of ankle was negative for fracture or arthropathy.He has had issues with urgency and hesitancy and Flomax was added with improvement in voiding  function.  His p.o. intake has been good and is tolerating dysphagia 3 diet with aspiration precautions.    He did develop left shoulder pain due to mild subluxation and has been educated on range of motion as well as elevation and support in addition to Voltaren gel for pain control.  He did develop right-sided low back pain and hip pain felt to be musculoskeletal in nature.  UA UC was negative for infection.  X-rays of hip and spine showed evidence of  mild OA. Tizanidine was added to help with muscle strain in addition to lidocaine patch for local measures. As tramadol was ineffective with pain management,  he was started on Norco to help with activity tolerance.  Gabapentin also added to help manage low back pain with neuropathy.  He has been educated on importance of alcohol cessation as well as medication compliance after discharge. Wife and patient have also been educated on body mechanics as well as working on activity out of bed and use of narcotics on prn basis.  He has made slow progress and continues to be limited by left hemiplegia, visual perceptual deficits, cognitive deficits as well as motor planning  deficits.  He currently requires mod assist overall and will continue to receive follow-up home health PT, OT and ST by [place] after discharge.    Rehab course: During patient's stay in rehab weekly team conferences were held to monitor patient's progress, set goals and discuss barriers to discharge. At admission, patient required plus to max assist for limited mobility and max assist with ADL tasks. He exhibited moderate cognitive impairments with mild impairments in orientation and visuospatial tasks and severe impairment in short-term recall.  He also required full supervision at meals due to left pocketing with spillage. He has had improvement in activity tolerance, balance, postural control as well as ability to compensate for deficits. He has had improvement in functional use LUE   and LLE as well as improvement in awareness. He requires set up assist with UB care and min to mod assist with LB care. He requires mod assist for bed mobility, min assist for tranfers and is able to ambulate 4' with +2 HHA and tactile cues for weight shifting, posture and sequencing. Family education was completed regarding all aspects of safety and care.   Disposition: Home  Diet: Dysphagia 3, heart healthy--monitor for left pocketing.   Special Instructions: 1.  Need to work on activity out of bed. 2.  Use heat and ice as needed to shoulder and back.  Do not apply heat to lidocaine patches. 3.  Avoid aspirin, NSAIDs and alcohol.   Discharge Instructions     Ambulatory referral to Neurology   Complete by: As directed    An appointment is requested in approximately: 2 -3 weeks   Ambulatory referral to Physical Medicine Rehab   Complete by: As directed    Needs TC appt for follow up/meds refills      Allergies as of 07/21/2020   No Known Allergies      Medication List     TAKE these medications    acetaminophen 325 MG tablet Commonly known as: TYLENOL Take 1-2 tablets (325-650 mg total) by mouth every 4 (four) hours as needed for mild pain.   amLODipine 10 MG tablet Commonly known as: NORVASC Take 1 tablet (10 mg total) by mouth daily.   atorvastatin 40 MG tablet Commonly known as: LIPITOR Take 1 tablet (40 mg total) by mouth daily at 6 PM.   Blood Pressure Kit 1 application by Does not apply route 2 (two) times daily.   CertaVite/Antioxidants Tabs Take 1 tablet by mouth daily.   cloNIDine 0.1 MG tablet Commonly known as: CATAPRES Take 1 tablet (0.1 mg total) by mouth 3 (three) times daily.   diclofenac Sodium 1 % Gel Commonly known as: VOLTAREN Apply 2 g topically 4 (four) times daily.   folic acid 1 MG tablet Commonly known as: FOLVITE Take 1 tablet (1 mg total) by mouth daily.   gabapentin 100 MG capsule Commonly known as: NEURONTIN Take 2 capsules  (200 mg total) by mouth 3 (three) times daily.   hydrocerin Crea Apply 1 application topically 2 (two) times daily.   HYDROcodone-acetaminophen 5-325 MG tablet Commonly known as: NORCO/VICODIN Take 1 tablet by mouth every 6 (six) hours as needed for severe pain.   labetalol 100 MG tablet Commonly known as: NORMODYNE Take 1 tablet (100 mg total) by mouth 2 (two) times daily.   levETIRAcetam 500 MG tablet Commonly known as: KEPPRA Take 1 tablet (500 mg total) by mouth 2 (two) times daily.   lidocaine 5 % Commonly known as: LIDODERM Place 2  patches onto the skin daily. To back at 8 am and remove at 8 pm daily. Has to be off for 12 hours. Purchase over the counter if insurance does not cover this.   losartan 50 MG tablet Commonly known as: COZAAR Take 1 tablet (50 mg total) by mouth 2 (two) times daily.   naphazoline-pheniramine 0.025-0.3 % ophthalmic solution Commonly known as: NAPHCON-A Place 1 drop into the right eye 4 (four) times daily as needed for eye irritation.   pantoprazole 40 MG tablet Commonly known as: PROTONIX Take 1 tablet (40 mg total) by mouth daily.   polyvinyl alcohol 1.4 % ophthalmic solution Commonly known as: LIQUIFILM TEARS Place 1 drop into both eyes every 6 (six) hours as needed for dry eyes.   QUEtiapine 25 MG tablet Commonly known as: SEROQUEL Take 1 tablet (25 mg total) by mouth at bedtime.   senna-docusate 8.6-50 MG tablet Commonly known as: Senokot-S Take 2 tablets by mouth daily with supper.   tamsulosin 0.4 MG Caps capsule Commonly known as: FLOMAX Take 1 capsule (0.4 mg total) by mouth daily after supper.   thiamine 100 MG tablet Take 1 tablet (100 mg total) by mouth daily.   tiZANidine 2 MG tablet Commonly known as: ZANAFLEX Take 1 tablet (2 mg total) by mouth 3 (three) times daily.   topiramate 25 MG tablet Commonly known as: TOPAMAX Take 2 tablets (50 mg total) by mouth daily.        Follow-up Information     [facility]. Call.   Why: for post hospital follow up Contact information: [address]     [address]  [place] [address] [phone]        Izora Ribas, MD Follow up.   Specialty: Physical Medicine and Rehabilitation Why: 09/17/20 1:00PM. Contact information: [address] [place] [address] [phone]         Camillia Herter, NP Follow up on 08/24/2020.   Specialty: Nurse Practitioner Why: Appointment @ 1:30 PM Contact information: [place] [address] [phone]                 Signed: Bary Leriche 07/21/2020, 4:51 PM

## 2020-07-20 NOTE — Discharge Summary (Signed)
Physical Therapy Discharge Summary  Patient Details  Name: [name] MRN: [id number] Date of Birth: 11-08-1965  Today's Date: 07/20/2020 PT Individual Time: 1105-1200 + 1500-1525 PT Individual Time Calculation (min): 55 min + 25 min   Patient has met 5 of 7 long term goals due to improved activity tolerance, improved balance, improved postural control, increased strength, increased range of motion, ability to compensate for deficits, functional use of  left upper extremity and left lower extremity, improved attention, and improved awareness.  Patient to discharge at a wheelchair level [place].   Patient's care partner is independent to provide the necessary physical and cognitive assistance at discharge.  Reasons goals not met: Pt required increased assist (typically modA) for bed mobility. Hospital bed has been recommended to reduce caregiver burden in this funtional mobility. Stair goals were set to 5 but pt only has 1 step to enter - education provided with hands on training to wife on bumping up/down in w/c.   Recommendation:  Patient will benefit from ongoing skilled PT services in home health setting to continue to advance safe functional mobility, address ongoing impairments in L hemibody weakness, decreased balance strategies in standing, functional mobility training to reduce caregiver burden in order to minimize fall risk.  Equipment: 20x18 hemi-height w/c with LUE 1/2 lap tray, hospital bed  Reasons for discharge: treatment goals met and discharge from hospital  Patient/family agrees with progress made and goals achieved: Yes  PT Discharge Precautions/Restrictions Precautions Precautions: Fall Precaution Comments: L hemi Restrictions Weight Bearing Restrictions: No Pain Pain Assessment Pain Scale: 0-10 Pain Score: 7  Faces Pain Scale: No hurt Pain Type: Acute pain (subacute) Pain Location: Back Pain Orientation: Right Pain Descriptors / Indicators:  Aching Pain Frequency: Constant Pain Onset: On-going Patients Stated Pain Goal: 1 Pain Intervention(s): Medication (See eMAR);Pain med given for lower pain score than stated, per patient request;RN made aware;Repositioned;Emotional support;Ambulation/increased activity Multiple Pain Sites: No Vision/Perception  Perception Perception: Impaired Inattention/Neglect: Does not attend to left side of body (significantly improved since IE; only occasional cues needed to attend to left side) Praxis Praxis: Impaired Praxis Impairment Details: Motor planning Praxis-Other Comments: significantly improved since IE  Cognition Overall Cognitive Status: Impaired/Different from baseline Arousal/Alertness: Awake/alert Orientation Level: Oriented X4 Attention: Sustained;Focused;Divided Focused Attention: Appears intact Focused Attention Impairment: Verbal basic;Functional basic Sustained Attention: Appears intact Sustained Attention Impairment: Verbal basic;Functional basic Divided Attention: Impaired Divided Attention Impairment: Verbal complex;Functional complex Memory: Impaired Memory Impairment: Decreased short term memory;Storage deficit Decreased Short Term Memory: Verbal complex;Functional complex Awareness: Impaired Problem Solving: Impaired Problem Solving Impairment: Verbal complex;Functional complex Behaviors: Impulsive (mild) Safety/Judgment: Impaired Sensation Sensation Light Touch: Impaired Detail Light Touch Impaired Details: Impaired LUE;Impaired LLE Hot/Cold: Appears Intact Proprioception: Impaired Detail Proprioception Impaired Details: Impaired LLE;Impaired LUE Stereognosis: Impaired by gross assessment Coordination Gross Motor Movements are Fluid and Coordinated: No Fine Motor Movements are Fluid and Coordinated: No Finger Nose Finger Test: unable to achieve with LUE  Motor  Motor Motor: Hemiplegia;Abnormal tone;Abnormal postural alignment and control Motor - Skilled  Clinical Observations: L hemiparesis, poor trunk control  Mobility Bed Mobility Bed Mobility: Rolling Right;Rolling Left;Supine to Sit;Sit to Supine Rolling Right: Contact Guard/Touching assist Rolling Left: Contact Guard/Touching assist Supine to Sit: Moderate Assistance - Patient 50-74% Sit to Supine: Moderate Assistance - Patient 50-74% Transfers Transfers: Sit to Stand;Stand to Sit;Squat Pivot Transfers Sit to Stand: Minimal Assistance - Patient > 75% (L knee block) Stand to Sit: Minimal Assistance - Patient > 75% Squat Pivot Transfers:  Minimal Assistance - Patient > 75% Transfer (Assistive device): None Locomotion  Gait Ambulation: Yes Gait Assistance: 2 Helpers (modA with w/c follow for safety) Gait Distance (Feet): 35 Feet Assistive device: Other (Comment) (R hand rail) Gait Assistance Details: Tactile cues for initiation;Tactile cues for placement;Tactile cues for weight beaing;Tactile cues for sequencing;Tactile cues for weight shifting;Tactile cues for posture;Visual cues/gestures for precautions/safety;Verbal cues for precautions/safety;Verbal cues for technique;Verbal cues for sequencing;Visual cues/gestures for sequencing;Verbal cues for gait pattern;Verbal cues for safe use of DME/AE;Manual facilitation for weight shifting;Manual facilitation for placement;Manual facilitation for weight bearing Gait Gait: Yes Gait Pattern: Impaired Gait Pattern: Step-to pattern;Decreased step length - left;Decreased hip/knee flexion - left;Decreased dorsiflexion - left;Decreased weight shift to left;Left hip hike;Left flexed knee in stance;Decreased trunk rotation;Trunk flexed;Poor foot clearance - left;Lateral hip instability Gait velocity: decreased Stairs / Additional Locomotion Stairs: No Architect: Yes Wheelchair Assistance: Chartered loss adjuster: Right lower extremity;Right upper extremity Wheelchair Parts Management: Needs  assistance Distance: 129f  Trunk/Postural Assessment  Cervical Assessment Cervical Assessment: Within Functional Limits Thoracic Assessment Thoracic Assessment: Exceptions to [facility](rounded shoulders) Lumbar Assessment Lumbar Assessment: Exceptions to [facility](posterior pelvic tilt) Postural Control Postural Control: Deficits on evaluation  Balance Balance Balance Assessed: Yes Static Sitting Balance Static Sitting - Balance Support: Feet supported;No upper extremity supported Static Sitting - Level of Assistance: 5: Stand by assistance Dynamic Sitting Balance Dynamic Sitting - Balance Support: Feet supported;During functional activity Dynamic Sitting - Level of Assistance: 4: Min assist Static Standing Balance Static Standing - Balance Support: No upper extremity supported Static Standing - Level of Assistance: 4: Min assist Dynamic Standing Balance Dynamic Standing - Balance Support: During functional activity;No upper extremity supported Dynamic Standing - Level of Assistance: 3: Mod assist Dynamic Standing - Balance Activities: Lateral lean/weight shifting;Reaching for objects;Reaching across midline;Forward lean/weight shifting Extremity Assessment  RUE Assessment RUE Assessment: Within Functional Limits LUE Assessment Passive Range of Motion (PROM) Comments: WNL (shoulder to 90 degrees forward flexion and scaption only due to mild subluxation noted) Active Range of Motion (AROM) Comments: shoulder FF/ABD 90 degrees; elbow flex ~110 degrees; digital flexion able to touch thenar/hypothenar eminence with I-small General Strength Comments: Shoulder elevation, abduction, and forward flexion 2/5; elbow flexion/extension 3-/5; forearm supination 2/5, pronation 3/5, wrist flex/ext 3/5, digital flexion 3/5, extension 2/5. LUE Body System: Neuro Brunstrum levels for arm and hand: Arm;Hand Brunstrum level for arm: Stage V Relative Independence from Synergy Brunstrum level for hand: Stage  IV Movements deviating from synergies RLE Assessment RLE Assessment: Within Functional Limits General Strength Comments: Grossly 4+/5 to 5/5 LLE Assessment LLE Assessment: Exceptions to [facility] Strength Comments: Ankle DF 1/5, knee ext 3-/5, hip flex 3-/5   Skilled Intervention:  1st session: Handoff of care at start of session from OT with patient brushing teeth while sitting in w/c. Pt able to use his RUE to brush teeth and manipulate tooth paste cap without assist via compensatory strategies. Pt continues to c/o R lower back pain, rated 7/10 - requests for lidocaine pain patch from RN - notified RN who arrived to administer. Pt also had received oral pain medications earlier this morning. Pt resolves some during session which was reported 6/10 at the end. Pt wheeled himself in hemi-height w/c (his own w/c, DME had been delivered to room) via hemi technique with R hemibody. He required supervision with cues for improving technique and efficiency and taking wide turns for doorways - propelled himself ~1741f+ ~15086furing our session. Next, worked  on blocked practice squat<>pivot transfers from w/c to mat table. He required modA initially, progressing to minA with repetitions. Cues and instruction needed for correct setup and sequencing. Next, worked on functional car transfers where he completed a total of 2 transfers requiring min/modA. He requires maxA for LLE management into and out of the car. He needs cues for repositioning and scooting for proper sitting alignment. Pt demonstrating improved ability to direct level of care and provided education to him on safety awareness and training in regards to functional mobility with his wife at home. Pt ended session seated in w/c with 1/2 lap tray supporting LUE and all needs within reach. Pt appreciative of therapies.  2nd session: Pt received semi-sidelying in bed to start session, agreeable to PT even though nursing just recently finished  assisting him back to bed. Removed old k-tape to R shoulder to protect skin integrity. Supine<>sit with HOB flat and use of bed rail, requiring modA for trunk elevation. Instructed patient to direct level of care for setup for functional transfers which pt did a great job of. He completed squat<>pivot transfer with very light minA from EOB to w/c. He was wheeled for time management to main rehab gym. NMR with gait training in hallway with +2 assist for w/c follow with modA overall for facilitating gait. He ambulated ~12f total with use of R hand rail and therapist on stool assisting with LLE management (needs blocking in terminal stance and min assist for facilitating swing/foot placement). Pt began to c/o worsening back pain and deferred further efforts. Returned to his room in his w/c and assisted pt back to bed with pt directing care again. Squat<>pivot transfer completed with minA and needing modA for sit to supine. Assisted pt back into sidelying position with pillows for pressure relief back comfort. All needs met with bed alarm on at end of session.   Stacy Sailer P Clarissia Mckeen PT, DPT 07/20/2020, 12:33 PM

## 2020-07-20 NOTE — Progress Notes (Signed)
Patient  resting in semi-fowlers position in bed, explain to patient again that urine specimen will need to be collected .He states no one is going to catheterize me'. I explain we will just increase his po intake and he acknowledge he understood this and agreed. Patient was also noted to have expiratory and inspiratory wheezing, and he states he had been doing this since this afternoon and that even the therapist had asked him "why was he wheezing,He then stated to writer he "did not know the answer but had notice it also. He was assessed and repositioned up in bed, patient did assisted staff in pulling himself up in bed using his right arm, monitored and assisted. VSS, Oxygen saturation 98-99% on RA. Marland Kitchen Monitor and assisted

## 2020-07-20 NOTE — Progress Notes (Signed)
[facility] PHYSICAL MEDICINE & REHABILITATION PROGRESS NOTE  Subjective/Complaints: Continues to have a lot of right sided lower back pain SBP increasing, likely secondary to pain Discussed trying trigger point injections there today. Continue heat, messaged nursing to reapply lidocaine patch  ROS: Patient denies fever, rash, sore throat, blurred vision, nausea, vomiting, diarrhea, cough, shortness of breath or chest pain,   headache, or mood change. +right sided lower back pain   Objective: Vital Signs: Blood pressure (!) 156/82, pulse 80, temperature 97.8 F (36.6 C), temperature source Oral, resp. rate 20, height 5\' 10"  (1.778 m), weight 105.4 kg, SpO2 99 %. No results found. Recent Labs    07/19/20 0530  WBC 6.0  HGB 11.4*  HCT 34.1*  PLT 257      Recent Labs    07/19/20 0530  NA 138  K 3.8  CL 108  CO2 22  GLUCOSE 109*  BUN 14  CREATININE 1.12  CALCIUM 9.4       Intake/Output Summary (Last 24 hours) at 07/20/2020 0918 Last data filed at 07/20/2020 0440 Gross per 24 hour  Intake 720 ml  Output 1600 ml  Net -880 ml        Physical Exam: BP (!) 156/82 (BP Location: Right Arm)   Pulse 80   Temp 97.8 F (36.6 C) (Oral)   Resp 20   Ht 5\' 10"  (1.778 m)   Wt 105.4 kg   SpO2 99%   BMI 33.34 kg/m   Gen: no distress, normal appearing HEENT: oral mucosa pink and moist, NCAT Cardio: Reg rate Chest: normal effort, normal rate of breathing Abd: soft, non-distended Ext: no edema Psych: pleasant, normal affect Skin: intact  Neuro: Cerebellar exam normal finger to nose to finger as well as heel to shin in bilateral upper and lower extremities Dysarthria, stable Motor: LUE: 3/5 proximal to distal LLE: HF, KE 3/5, ADF 1/5 Left inattention RUE/RLE: 5/5 proximal distal Sensation intact LT on RIght , absent on left  Musc: mid low back pain at belt line. Worst with movement, SLR. No referral. Tender to palpation   Assessment/Plan: 1. Functional deficits  which require 3+ hours per day of interdisciplinary therapy in a comprehensive inpatient rehab setting. Physiatrist is providing close team supervision and 24 hour management of active medical problems listed below. Physiatrist and rehab team continue to assess barriers to discharge/monitor patient progress toward functional and medical goals   Care Tool:  Bathing    Body parts bathed by patient: Chest, Abdomen, Left arm, Face, Front perineal area, Right lower leg, Right upper leg, Left upper leg, Left lower leg, Right arm (with use of long sponge)   Body parts bathed by helper: Right arm, Buttocks Body parts n/a: Right arm, Buttocks, Left lower leg   Bathing assist Assist Level: Minimal Assistance - Patient > 75%     Upper Body Dressing/Undressing Upper body dressing   What is the patient wearing?: Pull over shirt    Upper body assist Assist Level: Contact Guard/Touching assist    Lower Body Dressing/Undressing Lower body dressing      What is the patient wearing?: Incontinence brief, Pants     Lower body assist Assist for lower body dressing: Minimal Assistance - Patient > 75%     Toileting Toileting    Toileting assist Assist for toileting: Maximal Assistance - Patient 25 - 49%     Transfers Chair/bed transfer  Transfers assist     Chair/bed transfer assist level: Contact Guard/Touching assist  Locomotion Ambulation   Ambulation assist   Ambulation activity did not occur: Safety/medical concerns          Walk 10 feet activity   Assist  Walk 10 feet activity did not occur: Safety/medical concerns        Walk 50 feet activity   Assist Walk 50 feet with 2 turns activity did not occur: Safety/medical concerns         Walk 150 feet activity   Assist Walk 150 feet activity did not occur: Safety/medical concerns         Walk 10 feet on uneven surface  activity   Assist Walk 10 feet on uneven surfaces activity did not occur:  Safety/medical concerns         Wheelchair     Assist Will patient use wheelchair at discharge?: Yes Type of Wheelchair: Manual    Wheelchair assist level: Minimal Assistance - Patient > 75% Max wheelchair distance: 140ft    Wheelchair 50 feet with 2 turns activity    Assist        Assist Level: Minimal Assistance - Patient > 75%   Wheelchair 150 feet activity     Assist      Assist Level: Minimal Assistance - Patient > 75%    Medical Problem List and Plan: 1.  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 Course complicated by seizure  Continue PT, OT and SLP  WHO/PRAFO nightly 2.  Impaired mobility -DVT/anticoagulation:  Pharmaceutical: Continue Lovenox             -antiplatelet therapy: N/A due to bleed 3.  Ongoing headaches/pain Management: Continue Depakote and Keppra.  Continue Topamax to 50mg  4. Situational dysthymia: LCSW to follow for evaluation and support. Chaplain services consulted.              -antipsychotic agents: N/AA 5. Neuropsych: This patient is intermittently capable of making decisions on his own behalf. 6. Skin/Wound Care: Routine pressure-relief measures. Eucerin ordered for dry skin. Recommended wife bring coconut oil from home.  7. Fluids/Electrolytes/Nutrition: Monitor intake/output. Discussed anti-inflammatory diet, replacing ginger ale with water during the day and tart cherry juice at night, cranberry juice if he really wants juice during the day.  6/27 I personally reviewed the patient's labs today.  WNL 9.  Partial complex seizure: Continue Keppra 500 mg twice daily. 10.  HTN: Monitor blood pressures 3 times daily.              -- Labile, continue amlodipine, labetalol, losartan and clonidine             -SBP goal of <160 given ICH  6/11-6/27- BP controlled- continue regimen  6/28: isolated elevation today likely secondary to pain- messaged RN to reapply  lidocaine patch for now Vitals:   07/19/20 2008 07/20/20 0441  BP: 138/79 (!) 156/82  Pulse: 70 80  Resp: 19 20  Temp: 98.3 F (36.8 C) 97.8 F (36.6 C)  SpO2: 98% 99%   11.  Dyslipidemia: Continue statin. 12.  Acute kidney injury:   Resolved  13.  History of alcohol abuse/delirium: Continue Seroquel 25 mg at bedtime to help with sleep/wake cycle. Discussed this would not be long term             -- Continue folic acid and thiamine supplementation 14.  Acute blood loss anemia:   Hemoglobin 11.8 on 6/1, stable at 11.4 6/27  Continue to monitor 15.  Morbid obesity/OHS/OSA?: Outpatient sleep  study recommended for work-up 16.  Post stroke dysphagia  D3 thins, advance diet as tolerated  6/22- tolerating diet- no issues- continue regimen 17. Right ankle pain: suspect sprain given tenderness over ATFL. Ice 15mg  TID. Given chronicity of pain and pain with weightbearing, ordered right ankle XR- discussed with wife that there is no evidence of fracture or athropathy. Continue voltaren gel, d/c capsaicin. Requested RN soak foot in epsom salt if available. Ankle support brace ordered and patient wearing. Advised to elevate when resting, minimize weightbearing while with therapy to allow ligament to heal. Provided list of anti-inflammatory foods for pain. Discussed risks and benefits of various medications such as tylenol, NSAIDs, and prednisone and patient and wife decided on above conservative treatment and tylenol for now.    -improved.  18.  Urinary urgency but unable to void, suspect spastic bladder with BPH , start flomax, BP should support this med , as discussed with RN would check bladder scan as well  19. Left shoulder pain: continue lidocaine and voltaren gel. Advised wife to bring in blue emu oil at home. Discussed XR results: mild subluxation. Discussed SPRINT PNS outpatient.  Shoulder support, ROM advised 20. Confusion in morning as per wife: monitor for worsening with restart of  Gabapentin and tramadol.  21. Low back pain, right side: continue heating pad and add lidocaine patch. Discussed XR findings with patient- OA w/ osteophytes. Norco ordered as tramadol ineffective.  6/27 pain more central, +/- right-side. Lumbar spondylosis on xr  -trial of tizanidine 2mg  tid, hold clonidine if bp drops too low  -continue above regimen as well 6/28: pain still significant. Will try trigger point injections today.  22. Left groin pain: restart Gabapentin 100mg  TID- may be more muscular, stays in ext rotation at hip due to LLE weakness. XR of hip shows degeneration: discussed with patient.   23. Left pectoralis pain: lidocaine injected 6/20. Discussed benefits of Botox injection. Will discuss with wife for outpatient appointment in August. Improved.  24. Hypokalemia: klor 6/28: will also help with BP.  25. Disposition: HFU scheduled with me on 09/17/20 1:00PM. FL2 form completed, wife has requested copy as well.    LOS: 28 days A FACE TO FACE EVALUATION WAS PERFORMED  [name] 07/20/2020, 9:18 AM

## 2020-07-20 NOTE — Progress Notes (Signed)
Occupational Therapy Discharge Summary  Patient Details  Name: [name] MRN: [id number] Date of Birth: [birth year]  Today's Date: 07/20/2020 OT Individual Time: 1010-1105 OT Individual Time Calculation (min): 55 min    Patient has met 8 of 11 long term goals due to improved activity tolerance, improved balance, postural control, ability to compensate for deficits, functional use of  LEFT upper and LEFT lower extremity, improved attention, improved awareness, and improved coordination.  Patient to discharge at overall Mod Assist level.  Patient's care partner is independent to provide the necessary physical and cognitive assistance at discharge.    Reasons goals not met: Increased back pain limits pts ability to reach for LB dressing and also causes fluctuations in assist needed during functional transfers.    Recommendation:  Patient will benefit from ongoing skilled OT services in home health setting to continue to advance functional skills in the area of BADL.  Equipment: Drop arm bedside commode, w/c, hospital bed, resting hand splint  Reasons for discharge: discharge from hospital  Patient/family agrees with progress made and goals achieved: Yes  Skilled Intervention:  Pt semi upright in bed, c/o back pain but agreeable to try to sit up in w/c for sinkside self care.  Pt completed bed mobility and squat pivot transfer with mod assist primarily due to back pain.  Pt completed UB dressing and bathing with setup.  LB dressing needing mod assist due to pain in back limiting pts ability to thread clothing over bilateral feet.  Min assist to bathe LB including sit<>stand for pericare.  Pt brushed teeth sitting with setup.  Direct hand off to PT at end of session.  OT Discharge Precautions/Restrictions  Precautions Precautions: Fall Precaution Comments: L hemi Restrictions Weight Bearing Restrictions: No Pain Pain Assessment Pain Scale: 0-10 Pain Score: 8  Faces Pain Scale: No  hurt Pain Type: Acute pain Pain Location: Back Pain Orientation: Right Pain Descriptors / Indicators: Aching Pain Frequency: Constant Pain Onset: On-going Patients Stated Pain Goal: 1 Pain Intervention(s): RN made aware;Rest;Repositioned Multiple Pain Sites: No ADL ADL Eating: Set up Grooming: Setup Where Assessed-Grooming: Sitting at sink Upper Body Bathing: Supervision/safety Where Assessed-Upper Body Bathing: Sitting at sink Lower Body Bathing: Minimal assistance Where Assessed-Lower Body Bathing: Standing at sink, Sitting at sink Upper Body Dressing: Supervision/safety Where Assessed-Upper Body Dressing: Sitting at sink Lower Body Dressing: Moderate assistance Where Assessed-Lower Body Dressing: Sitting at sink, Standing at sink Toileting: Moderate assistance Where Assessed-Toileting: Bedside Commode Toilet Transfer: Moderate assistance Toilet Transfer Method: Squat pivot Toilet Transfer Equipment: Energy manager: Moderate assistance Social research officer, government Method: Education officer, environmental: Radio broadcast assistant, Grab bars Vision Baseline Vision/History: No visual deficits Patient Visual Report: No change from baseline Vision Assessment?: No apparent visual deficits Perception  Perception: Impaired Inattention/Neglect: Does not attend to left side of body (significantly improved since IE; only occasional cues needed to attend to left side) Praxis Praxis: Impaired Praxis Impairment Details: Motor planning Praxis-Other Comments: significantly improved since IE Cognition Overall Cognitive Status: Impaired/Different from baseline Arousal/Alertness: Awake/alert Orientation Level: Oriented X4 Attention: Sustained;Focused;Alternating;Selective Focused Attention: Appears intact Focused Attention Impairment: Verbal basic;Functional basic Sustained Attention: Appears intact Sustained Attention Impairment: Verbal basic;Functional basic Selective  Attention: Impaired Selective Attention Impairment: Functional basic;Functional complex Alternating Attention: Impaired Alternating Attention Impairment: Functional basic;Functional complex Divided Attention: Impaired Divided Attention Impairment: Verbal complex;Functional complex Memory: Impaired Memory Impairment: Decreased short term memory;Storage deficit;Retrieval deficit;Decreased recall of new information Decreased Short Term Memory: Functional complex Awareness: Impaired  Awareness Impairment: Anticipatory impairment Problem Solving: Impaired Problem Solving Impairment: Functional basic;Functional complex Behaviors: Impulsive (mild) Safety/Judgment: Impaired Sensation Sensation Light Touch: Impaired Detail Light Touch Impaired Details: Impaired LUE;Impaired LLE Hot/Cold: Appears Intact Proprioception: Impaired Detail Proprioception Impaired Details: Impaired LLE;Impaired LUE Stereognosis: Impaired by gross assessment Coordination Gross Motor Movements are Fluid and Coordinated: No Fine Motor Movements are Fluid and Coordinated: No Finger Nose Finger Test: unable to achieve with LUE Motor  Motor Motor: Hemiplegia;Abnormal tone;Abnormal postural alignment and control Motor - Skilled Clinical Observations: L hemiparesis, poor trunk control Mobility  Bed Mobility Bed Mobility: Rolling Right;Rolling Left;Supine to Sit;Sit to Supine Rolling Right: Minimal Assistance - Patient > 75% Rolling Left: Minimal Assistance - Patient > 75% Supine to Sit: Moderate Assistance - Patient 50-74% Sit to Supine: Moderate Assistance - Patient 50-74% Transfers Sit to Stand: Minimal Assistance - Patient > 75% Stand to Sit: Minimal Assistance - Patient > 75%  Trunk/Postural Assessment  Cervical Assessment Cervical Assessment: Within Functional Limits Thoracic Assessment Thoracic Assessment: Exceptions to [facility] (rounded shoulders) Lumbar Assessment Lumbar Assessment: Exceptions to [facility]  (posterior pelvic tilt) Postural Control Postural Control: Deficits on evaluation Trunk Control: L lean when not attending to L side; however greatly improved since IE, only needing min VCs during seated/standing self care  Balance Balance Balance Assessed: Yes Static Sitting Balance Static Sitting - Balance Support: Feet supported;No upper extremity supported Static Sitting - Level of Assistance: 5: Stand by assistance Dynamic Sitting Balance Dynamic Sitting - Balance Support: Feet supported;During functional activity Dynamic Sitting - Level of Assistance: 4: Min assist Static Standing Balance Static Standing - Balance Support: No upper extremity supported Static Standing - Level of Assistance: 4: Min assist Dynamic Standing Balance Dynamic Standing - Balance Support: During functional activity;No upper extremity supported Dynamic Standing - Level of Assistance: 3: Mod assist Dynamic Standing - Balance Activities: Lateral lean/weight shifting;Reaching for objects;Reaching across midline;Forward lean/weight shifting Extremity/Trunk Assessment RUE Assessment RUE Assessment: Within Functional Limits LUE Assessment Passive Range of Motion (PROM) Comments: WNL (shoulder to 90 degrees forward flexion and scaption only due to mild subluxation noted) Active Range of Motion (AROM) Comments: shoulder FF/ABD 90 degrees; elbow flex ~110 degrees; digital flexion able to touch thenar/hypothenar eminence with I-small General Strength Comments: Shoulder elevation, abduction, and forward flexion 2/5; elbow flexion/extension 3-/5; forearm supination 2/5, pronation 3/5, wrist flex/ext 3/5, digital flexion 3/5, extension 2/5. LUE Body System: Neuro Brunstrum levels for arm and hand: Arm;Hand Brunstrum level for arm: Stage V Relative Independence from Synergy Brunstrum level for hand: Stage IV Movements deviating from synergies    L  07/20/2020, 3:07 PM

## 2020-07-20 NOTE — Patient Care Conference (Signed)
Inpatient RehabilitationTeam Conference and Plan of Care Update Date: 07/20/2020   Time: 13:05 PM    Patient Name: [name]      Medical Record Number: [id number]  Date of Birth: 1965/06/16 Sex: Male         Room/Bed: 4W18C/4W18C-01 Payor Info: Payor: [employer] / Plan: [employer] NAP / Product Type: *No Product type* /    Admit Date/Time:  06/22/2020  2:56 PM  Primary Diagnosis:  Thalamic hemorrhage [facility])  Hospital Problems: Principal Problem:   Thalamic hemorrhage (HCC) Active Problems:   Acute blood loss anemia   AKI (acute kidney injury) (HCC)   Essential hypertension   Seizures (HCC)   Vascular headache   Dysphagia, post-stroke   Left hemiplegia (HCC)   Cognitive deficit due to recent stroke    Expected Discharge Date: Expected Discharge Date: 07/21/20  Team Members Present: Physician leading conference: Dr. Sula Soda Care Coodinator Present: Chana Bode, RN, BSN, CRRN;Becky Dupree, LCSW Nurse Present: Chana Bode, RN PT Present: Wynelle Link, PT OT Present: Dolphus Jenny, OT SLP Present: Colin Benton, SLP PPS Coordinator present : Fae Pippin, SLP     Current Status/Progress Goal Weekly Team Focus  Bowel/Bladder   Patient is continent and incontinent of B/B. LBM 07/18/20  Patient will gain continence of both B&B  Time toileting Q2 hrs,, assess toileting needs   Swallow/Nutrition/ Hydration             ADL's             Mobility   modA bed mobility, minA squat<>pivot transfers, supervision w/c propulsion with hemi technique  minA  DC planning, family training, pt education, safety awareness, functional transfers   Communication             Safety/Cognition/ Behavioral Observations  supervision-min A  Supervision A  education   Pain   Continue to c/o-eft shoulder and left groin pain, medicated with schedule and PRN pain medication, continue Volteran cream and Lidoderm patches to left shoulder and lower back  Pain relief < 3      Skin   Skin  intact  Maintain skin integrity  QS/PRN assessemnt, address incontinece episodes in a timely manner, continue to monitor for skin irritation, redness or breakdown     Discharge Planning:  Wife in for education yesterday but pt havng back issues and limited in his movement. requested for wife to come in today but unable to. Limited family education completed concerns regarding ability to care for pt at home.   Team Discussion: Left side pelvic pain improved however reporting right back pain and noted wheezes. BP slightly elevated; possibly due to pain.  Patient on target to meet rehab goals: Currently max assist for lower body ADLs and supervision for upper body care. Min - mod assist for t squat pivot transfers and supervision for wheelchair propulsion. Goals for discharge set at supervision level for cognition.  *See Care Plan and progress notes for long and short-term goals.   Revisions to Treatment Plan:  Practice car transfers   Working on medication management, time management, error awareness, attention and memory Teaching Needs: Safety, transfers, toileting, medications, secondary stroke risk management, etc  Current Barriers to Discharge: Decreased caregiver support and Home enviroment access/layout  Possible Resolutions to Barriers: Family education with wife (declined) Zenaida Niece transport to home scheduled     Medical Summary Current Status: right lower back pain, left sided groin pain, right ankle pain, left pec pain, elevated systolic BP, incontinent at times  Barriers to Discharge Comments: right lower back pain, left sided groin pain, right ankle pain, left pec pain, elevated systolic BP, incontinent at times Possible Resolutions to [employer] Focus: trigger point injections today, continue gabapentin, continue lidocaine patch, bracing, icing, continue to monitor BP   Continued Need for Acute Rehabilitation Level of Care: The patient requires daily medical management by  a physician with specialized training in physical medicine and rehabilitation for the following reasons: Direction of a multidisciplinary physical rehabilitation program to maximize functional independence : Yes Medical management of patient stability for increased activity during participation in an intensive rehabilitation regime.: Yes Analysis of laboratory values and/or radiology reports with any subsequent need for medication adjustment and/or medical intervention. : Yes   I attest that I was present, lead the team conference, and concur with the assessment and plan of the team.   Chana Bode B 07/20/2020, 5:05 PM

## 2020-07-20 NOTE — Progress Notes (Signed)
Speech Language Pathology Discharge Summary  Patient Details  Name: [name] MRN: [id number] Date of Birth: Feb 01, 1965  Today's Date: 07/20/2020 SLP Individual Time: 0900-1000 SLP Individual Time Calculation (min): 60 min   Skilled Therapeutic Interventions:  Skilled ST intervention performed with focus on cognitive goals and education prior to discharge. SLP facilitated medication management task using am/pm pillbox involving problem solving, working memory, visual scanning, sustained attention, and comprehension of medication labels. Patient successfully identified organization errors at supervision level with occasional cues for processing and accuracy. Patient was able to generate solutions for organization errors with 100% accuracy at independent level. Throughout treatment, patient displayed thoughtful awareness through anticipating needs prior to discharge, initiating use of memory aide to recall needed items for medication management, home set-up, and transportation considerations. All education has been completed. Patient was left in bed with alarm activated and needs within reach. Sign off from [place] services.   Patient has met 7 of 7 long term goals.  Patient to discharge at overall supervision level.   Reasons goals not met: NA  Clinical Impression/Discharge Summary: Patient has made consistent progress and has met 7 of 7 LTGs. He is currently tolerating a regular diet and thin liquids with occasional reminders to assess left buccal cavity for residue and minimal-to-no signs of symptoms of aspiration. Patient is recommended to discharge with current diet. Patient is currently at supervision level from a cognitive perspective and benefits from intermittent verbal cues for problem solving, memory, awareness, and attention. It is common for him to initially require Min A verbal cues and he generally progresses to supervision level with good carry over during tasks. All education has been  completed with patient and spouse and patient is to discharge home at supervision level with 24/7 supervision. Patient would benefit from continued SLP services to further maximize cognitive functions (with focus on complex problem solving, short-term recall, awareness), functional independence, and in order to decrease caregiver burden.   Care Partner:  Caregiver Able to Provide Assistance: Yes  Type of Caregiver Assistance: Physical;Cognitive  Recommendation:  24 hour supervision/assistance;Outpatient SLP;Home Health SLP  Rationale for SLP Follow Up: Maximize cognitive function and independence;Reduce caregiver burden   Equipment: NA   Reasons for discharge: Treatment goals met;Discharged from hospital   Patient/Family Agrees with Progress Made and Goals Achieved: Yes    Patty Sermons 07/20/2020, 4:51 PM

## 2020-07-20 NOTE — Progress Notes (Signed)
Resting medicated x2 this shift for c/o  of left shoulder and back pain with radiating sensation to left arm, medication does help states patient but never take it completely away, Continue to assist and assess per protocol.

## 2020-07-20 NOTE — Procedures (Signed)
Trigger Point Injection  Indication: Lumbar myofascial pain not relieved by medication management and other conservative care.  Informed consent was obtained after describing risk and benefits of the procedure with the patient, this includes bleeding, bruising, infection and medication side effects.  The patient wishes to proceed.  The patient was placed in a seated position.  The cervical area was marked and prepped with Betadine.  It was entered with a 25-gauge 1/2 inch needle and a total of 5 mL of 1% lidocaine was injected into a total of 3 trigger points, after negative draw back for blood.  The patient tolerated the procedure well.  Post procedure instructions were given.

## 2020-07-21 ENCOUNTER — Other Ambulatory Visit (HOSPITAL_COMMUNITY): Payer: Self-pay

## 2020-07-21 LAB — URINE CULTURE

## 2020-07-21 MED ORDER — PANTOPRAZOLE SODIUM 40 MG PO TBEC
40.0000 mg | DELAYED_RELEASE_TABLET | Freq: Every day | ORAL | 0 refills | Status: DC
  Filled 2020-07-21: qty 30, 30d supply, fill #0

## 2020-07-21 MED ORDER — ATORVASTATIN CALCIUM 40 MG PO TABS
40.0000 mg | ORAL_TABLET | Freq: Every day | ORAL | 0 refills | Status: DC
  Filled 2020-07-21: qty 30, 30d supply, fill #0

## 2020-07-21 MED ORDER — QUETIAPINE FUMARATE 25 MG PO TABS
25.0000 mg | ORAL_TABLET | Freq: Every day | ORAL | 0 refills | Status: DC
  Filled 2020-07-21: qty 30, 30d supply, fill #0

## 2020-07-21 MED ORDER — TOPIRAMATE 25 MG PO TABS
50.0000 mg | ORAL_TABLET | Freq: Every day | ORAL | 0 refills | Status: DC
  Filled 2020-07-21: qty 60, 30d supply, fill #0

## 2020-07-21 MED ORDER — TAMSULOSIN HCL 0.4 MG PO CAPS
0.4000 mg | ORAL_CAPSULE | Freq: Every day | ORAL | 0 refills | Status: DC
  Filled 2020-07-21: qty 30, 30d supply, fill #0

## 2020-07-21 MED ORDER — GABAPENTIN 100 MG PO CAPS
200.0000 mg | ORAL_CAPSULE | Freq: Three times a day (TID) | ORAL | 0 refills | Status: DC
  Filled 2020-07-21: qty 180, 30d supply, fill #0

## 2020-07-21 MED ORDER — LIDOCAINE 5 % EX PTCH
2.0000 | MEDICATED_PATCH | CUTANEOUS | 0 refills | Status: DC
  Filled 2020-07-21: qty 60, 30d supply, fill #0

## 2020-07-21 MED ORDER — THIAMINE HCL 100 MG PO TABS
100.0000 mg | ORAL_TABLET | Freq: Every day | ORAL | 0 refills | Status: AC
  Filled 2020-07-21: qty 30, 30d supply, fill #0

## 2020-07-21 MED ORDER — NAPHAZOLINE-PHENIRAMINE 0.025-0.3 % OP SOLN
1.0000 [drp] | Freq: Four times a day (QID) | OPHTHALMIC | 0 refills | Status: DC | PRN
  Filled 2020-07-21: qty 15, 75d supply, fill #0

## 2020-07-21 MED ORDER — CLONIDINE HCL 0.1 MG PO TABS
0.1000 mg | ORAL_TABLET | Freq: Three times a day (TID) | ORAL | 0 refills | Status: DC
  Filled 2020-07-21: qty 90, 30d supply, fill #0

## 2020-07-21 MED ORDER — CERTAVITE/ANTIOXIDANTS PO TABS
1.0000 | ORAL_TABLET | Freq: Every day | ORAL | 0 refills | Status: AC
  Filled 2020-07-21: qty 30, 30d supply, fill #0

## 2020-07-21 MED ORDER — LABETALOL HCL 100 MG PO TABS
100.0000 mg | ORAL_TABLET | Freq: Two times a day (BID) | ORAL | 0 refills | Status: DC
  Filled 2020-07-21: qty 60, 30d supply, fill #0

## 2020-07-21 MED ORDER — FOLIC ACID 1 MG PO TABS
1.0000 mg | ORAL_TABLET | Freq: Every day | ORAL | 0 refills | Status: DC
  Filled 2020-07-21: qty 30, 30d supply, fill #0

## 2020-07-21 MED ORDER — TIZANIDINE HCL 2 MG PO TABS
2.0000 mg | ORAL_TABLET | Freq: Three times a day (TID) | ORAL | 0 refills | Status: DC
  Filled 2020-07-21: qty 90, 30d supply, fill #0

## 2020-07-21 MED ORDER — POLYVINYL ALCOHOL 1.4 % OP SOLN: 1.0000 [drp] | Freq: Four times a day (QID) | OPHTHALMIC | 0 refills | Status: DC | PRN

## 2020-07-21 MED ORDER — ACETAMINOPHEN 325 MG PO TABS: 325.0000 mg | ORAL_TABLET | ORAL | Status: DC | PRN

## 2020-07-21 MED ORDER — HYDROCERIN EX CREA
1.0000 "application " | TOPICAL_CREAM | Freq: Two times a day (BID) | CUTANEOUS | 0 refills | Status: DC
  Filled 2020-07-21: qty 228, 29d supply, fill #0

## 2020-07-21 MED ORDER — BLOOD PRESSURE KIT: 1.0000 "application " | PACK | Freq: Two times a day (BID) | 0 refills | Status: DC

## 2020-07-21 MED ORDER — HYDROCODONE-ACETAMINOPHEN 5-325 MG PO TABS
1.0000 | ORAL_TABLET | Freq: Four times a day (QID) | ORAL | 0 refills | Status: DC | PRN
  Filled 2020-07-21: qty 20, 5d supply, fill #0

## 2020-07-21 MED ORDER — AMLODIPINE BESYLATE 10 MG PO TABS
10.0000 mg | ORAL_TABLET | Freq: Every day | ORAL | 0 refills | Status: DC
  Filled 2020-07-21: qty 30, 30d supply, fill #0

## 2020-07-21 MED ORDER — LOSARTAN POTASSIUM 50 MG PO TABS
50.0000 mg | ORAL_TABLET | Freq: Two times a day (BID) | ORAL | 0 refills | Status: DC
  Filled 2020-07-21: qty 60, 30d supply, fill #0

## 2020-07-21 MED ORDER — DICLOFENAC SODIUM 1 % EX GEL
2.0000 g | Freq: Four times a day (QID) | CUTANEOUS | 0 refills | Status: DC
  Filled 2020-07-21: qty 200, 20d supply, fill #0

## 2020-07-21 MED ORDER — LEVETIRACETAM 500 MG PO TABS
500.0000 mg | ORAL_TABLET | Freq: Two times a day (BID) | ORAL | 0 refills | Status: DC
  Filled 2020-07-21: qty 60, 30d supply, fill #0

## 2020-07-21 MED ORDER — SENNOSIDES-DOCUSATE SODIUM 8.6-50 MG PO TABS
2.0000 | ORAL_TABLET | Freq: Every day | ORAL | 0 refills | Status: DC
  Filled 2020-07-21: qty 60, 30d supply, fill #0

## 2020-07-21 NOTE — Progress Notes (Signed)
Patient was restful throughout shift, states he very happy tp be going home today, Pain is currently 3/10, no acute distress, call bell within reach, bed alarm on

## 2020-07-21 NOTE — Progress Notes (Signed)
[facility] PHYSICAL MEDICINE & REHABILITATION PROGRESS NOTE  Subjective/Complaints: Continues to have a lot of right sided lower back pain SBP increasing, likely secondary to pain Discussed trying trigger point injections there today. Continue heat, messaged nursing to reapply lidocaine patch  ROS: Patient denies fever, rash, sore throat, blurred vision, nausea, vomiting, diarrhea, cough, shortness of breath or chest pain,   headache, or mood change. +right sided lower back pain   Objective: Vital Signs: Blood pressure 127/77, pulse 71, temperature 97.9 F (36.6 C), temperature source Oral, resp. rate 20, height 5\' 10"  (1.778 m), weight 105.4 kg, SpO2 96 %. No results found. Recent Labs    07/19/20 0530  WBC 6.0  HGB 11.4*  HCT 34.1*  PLT 257      Recent Labs    07/19/20 0530  NA 138  K 3.8  CL 108  CO2 22  GLUCOSE 109*  BUN 14  CREATININE 1.12  CALCIUM 9.4       Intake/Output Summary (Last 24 hours) at 07/21/2020 0851 Last data filed at 07/21/2020 07/23/2020 Gross per 24 hour  Intake 720 ml  Output 725 ml  Net -5 ml        Physical Exam: BP 127/77 (BP Location: Left Arm)   Pulse 71   Temp 97.9 F (36.6 C) (Oral)   Resp 20   Ht 5\' 10"  (1.778 m)   Wt 105.4 kg   SpO2 96%   BMI 33.34 kg/m   Gen: no distress, normal appearing HEENT: oral mucosa pink and moist, NCAT Cardio: Reg rate Chest: normal effort, normal rate of breathing Abd: soft, non-distended Ext: no edema Psych: pleasant, normal affect Skin: intact  Neuro: Cerebellar exam normal finger to nose to finger as well as heel to shin in bilateral upper and lower extremities Dysarthria, stable Motor: LUE: 3/5 proximal to distal LLE: HF, KE 3/5, ADF 1/5 Left inattention RUE/RLE: 5/5 proximal distal Sensation intact LT on RIght , absent on left  Musc: mid low back pain at belt line. Worst with movement, SLR. No referral. Tender to palpation   Assessment/Plan: 1. Functional deficits which  require 3+ hours per day of interdisciplinary therapy in a comprehensive inpatient rehab setting. Physiatrist is providing close team supervision and 24 hour management of active medical problems listed below. Physiatrist and rehab team continue to assess barriers to discharge/monitor patient progress toward functional and medical goals   Care Tool:  Bathing    Body parts bathed by patient: Right arm, Left arm, Chest, Abdomen, Front perineal area, Buttocks, Right upper leg, Left upper leg, Face   Body parts bathed by helper: Right arm, Buttocks Body parts n/a: Right arm, Buttocks, Left lower leg   Bathing assist Assist Level: Minimal Assistance - Patient > 75%     Upper Body Dressing/Undressing Upper body dressing   What is the patient wearing?: Pull over shirt    Upper body assist Assist Level: Supervision/Verbal cueing    Lower Body Dressing/Undressing Lower body dressing      What is the patient wearing?: Incontinence brief, Pants     Lower body assist Assist for lower body dressing: Moderate Assistance - Patient 50 - 74%     Toileting Toileting    Toileting assist Assist for toileting: Moderate Assistance - Patient 50 - 74%     Transfers Chair/bed transfer  Transfers assist     Chair/bed transfer assist level: Minimal Assistance - Patient > 75%     Locomotion Ambulation   Ambulation assist  Ambulation activity did not occur: Safety/medical concerns  Assist level: 2 helpers Assistive device: Other (comment) (hand rail) Max distance: 67ft   Walk 10 feet activity   Assist  Walk 10 feet activity did not occur: Safety/medical concerns  Assist level: 2 helpers Assistive device: Other (comment) (hand rail on R)   Walk 50 feet activity   Assist Walk 50 feet with 2 turns activity did not occur: Safety/medical concerns         Walk 150 feet activity   Assist Walk 150 feet activity did not occur: Safety/medical concerns         Walk 10  feet on uneven surface  activity   Assist Walk 10 feet on uneven surfaces activity did not occur: Safety/medical concerns         Wheelchair     Assist Will patient use wheelchair at discharge?: Yes Type of Wheelchair: Manual    Wheelchair assist level: Supervision/Verbal cueing Max wheelchair distance: 161ft    Wheelchair 50 feet with 2 turns activity    Assist        Assist Level: Supervision/Verbal cueing   Wheelchair 150 feet activity     Assist      Assist Level: Supervision/Verbal cueing    Medical Problem List and Plan: 1.  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 Course complicated by seizure  DC home today. Outpt f/u with Dr. Carlis Abbott  WHO/PRAFO nightly 2.  Impaired mobility -DVT/anticoagulation:  Pharmaceutical: Continue Lovenox             -antiplatelet therapy: N/A due to bleed 3.  Ongoing headaches/pain Management: Continue Depakote and Keppra.  Continue Topamax to 50mg  4. Situational dysthymia: LCSW to follow for evaluation and support. Chaplain services consulted.              -antipsychotic agents: N/AA 5. Neuropsych: This patient is intermittently capable of making decisions on his own behalf. 6. Skin/Wound Care: Routine pressure-relief measures. Eucerin ordered for dry skin. Recommended wife bring coconut oil from home.  7. Fluids/Electrolytes/Nutrition: Monitor intake/output. Discussed anti-inflammatory diet, replacing ginger ale with water during the day and tart cherry juice at night, cranberry juice if he really wants juice during the day.  6/27 I personally reviewed the patient's labs today.  WNL 9.  Partial complex seizure: Continue Keppra 500 mg twice daily. 10.  HTN: Monitor blood pressures 3 times daily.              -- Labile, continue amlodipine, labetalol, losartan and clonidine             -SBP goal of <160 given ICH  6/11-6/27- BP controlled- continue  regimen  6/28: isolated elevation today likely secondary to pain- messaged RN to reapply lidocaine patch for now Vitals:   07/20/20 1937 07/21/20 0502  BP: 124/85 127/77  Pulse: 79 71  Resp: 20 20  Temp: 97.8 F (36.6 C) 97.9 F (36.6 C)  SpO2: 97% 96%   11.  Dyslipidemia: Continue statin. 12.  Acute kidney injury:   Resolved  13.  History of alcohol abuse/delirium: Continue Seroquel 25 mg at bedtime to help with sleep/wake cycle. Discussed this would not be long term             -- Continue folic acid and thiamine supplementation 14.  Acute blood loss anemia:   Hemoglobin 11.8 on 6/1, stable at 11.4 6/27  Continue to monitor 15.  Morbid obesity/OHS/OSA?: Outpatient sleep study recommended for  work-up 16.  Post stroke dysphagia  D3 thins, advance diet as tolerated  6/22- tolerating diet- no issues- continue regimen 17. Right ankle pain: suspect sprain given tenderness over ATFL. Ice 15mg  TID. Given chronicity of pain and pain with weightbearing, ordered right ankle XR- discussed with wife that there is no evidence of fracture or athropathy. Continue voltaren gel, d/c capsaicin. Requested RN soak foot in epsom salt if available. Ankle support brace ordered and patient wearing. Advised to elevate when resting, minimize weightbearing while with therapy to allow ligament to heal. Provided list of anti-inflammatory foods for pain. Discussed risks and benefits of various medications such as tylenol, NSAIDs, and prednisone and patient and wife decided on above conservative treatment and tylenol for now.    -improved.  18.  Urinary urgency but unable to void, suspect spastic bladder with BPH , start flomax, BP should support this med , as discussed with RN would check bladder scan as well  19. Left shoulder pain: continue lidocaine and voltaren gel. Advised wife to bring in blue emu oil at home. Discussed XR results: mild subluxation. Discussed SPRINT PNS outpatient.  Shoulder support, ROM  advised 20. Confusion in morning as per wife: monitor for worsening with restart of Gabapentin and tramadol.  21. Low back pain, right side: continue heating pad and add lidocaine patch. Discussed XR findings with patient- OA w/ osteophytes. Norco ordered as tramadol ineffective.  6/27 pain more central, +/- right-side. Lumbar spondylosis on xr  -trial of tizanidine 2mg  tid, hold clonidine if bp drops too low  -continue above regimen as well 6/28: pain still significant. Will try trigger point injections today.  6/29 pt reports improvement with TPI as well as tizanidine   -encouraged appropriate posture  -oob as much as possible 22. Left groin pain: restart Gabapentin 100mg  TID- may be more muscular, stays in ext rotation at hip due to LLE weakness. XR of hip shows degeneration: discussed with patient.   23. Left pectoralis pain: lidocaine injected 6/20. Discussed benefits of Botox injection. Will discuss with wife for outpatient appointment in August. Improved.  24. Hypokalemia: klor 6/28: will also help with BP.  25. Disposition: HFU scheduled with me on 09/17/20 1:00PM. FL2 form completed, wife has requested copy as well.    LOS: 29 days A FACE TO FACE EVALUATION WAS PERFORMED  07/21/2020, 8:51 AM

## 2020-07-22 ENCOUNTER — Telehealth: Payer: Self-pay

## 2020-07-22 ENCOUNTER — Other Ambulatory Visit (HOSPITAL_COMMUNITY): Payer: Self-pay

## 2020-07-22 ENCOUNTER — Other Ambulatory Visit: Payer: Self-pay

## 2020-07-22 ENCOUNTER — Encounter: Payer: 59 | Attending: Physical Medicine and Rehabilitation | Admitting: Physical Medicine and Rehabilitation

## 2020-07-22 DIAGNOSIS — G8194 Hemiplegia, unspecified affecting left nondominant side: Secondary | ICD-10-CM

## 2020-07-22 NOTE — Telephone Encounter (Signed)
Transition Care Management Follow-up Telephone Call  Call completed with patient's wife, [name].  Date of discharge and from where: 07/21/2020, [facility] How have you been since you were released from the hospital? [name] said that he continues to have a lot of lower back pain. The same pain he had experienced in the hospital. Reviewed the orders for use of lidocaine patch and when to use heat/ice.  Any questions or concerns? Yes - noted above.   Items Reviewed: Did the pt receive and understand the discharge instructions provided? Yes  Medications obtained and verified? Yes  - she said that she has all of his medications and didn't have any questions at this time.  Other? No  Any new allergies since your discharge? No  Dietary orders reviewed? Yes- she said that he has been tolerating thin liquids without any difficulty.  Do you have support at home? Yes  - he has his wife as the primary caregiver. She said others have same they will assist when they can but everyone works.   Home Care and Equipment/Supplies: Were home health services ordered? yes If so, what is the name of the agency? [facility] - PT/OT/ST  Has the agency set up a time to come to the patient's home? no Were any new equipment or medical supplies ordered?  Yes: hospital bed, BSC, tub bench and wheelchair What is the name of the medical supply agency? [facility]  Were you able to get the supplies/equipment? She said that they have the wheelchair and BSC.  They do not have the hospital bed or tub bench yet.  She said that their home is very small and she is not sure where to put the hospital bed.  She has a friend coming over later today to help her measure the house to determine the best place for the bed.  The bathroom door is too small to accommodate the wheelchair . Do you have any questions related to the use of the equipment or supplies? No  Functional Questionnaire: (I = Independent and D =  Dependent) ADLs: dependent. - his wife will be providing all of the care. She needs to assist with transfers, personal care, ADLs, medication management.    Follow up appointments reviewed:  PCP Hospital f/u appt confirmed? Yes  Scheduled to see Ricky Stabs, NP  on 07/30/2020 @ 1050. Ms Zonia Kief is listed as his PCP but he has never been seen by her.  Specialist Hospital f/u appt confirmed? Yes  Scheduled to see - PMR - 08/04/2020.  His wife was not aware of an appointment today. Are transportation arrangements needed? Yes - will need to contact [employer]. Explained to patient's wife that he will need to be out of the house in the wheelchair in order to be transported.  They will not come into his home to assist. There is one step about 6 inches into the home and he stays on the first floor.  If their condition worsens, is the pt aware to call PCP or go to the Emergency Dept.? Yes Was the patient provided with contact information for the PCP's office or ED? Yes Was to pt encouraged to call back with questions or concerns? Yes

## 2020-07-22 NOTE — Progress Notes (Signed)
Subjective:    Patient ID: [name], male    DOB: 1965-05-05, 55 y.o.   MRN: [id number]  HPI An audio/video tele-health visit is felt to be the most appropriate encounter for this patient at this time. This is a follow up tele-visit via phone. The patient is at home. MD is at office.    [name] presents for follow-up of left sided hemiplegia and right sided lower back pain following CVA -pain has been severe at home, as it had been in hospital -his wife purchased 500mg  tablets of tylenol -he has a one week supply of Norco -the benefits from the trigger point injections last week have worn off -pain is severely limiting his function -he is taking Tizanidine and it does not help, does make him sleepy, but his wife is trying to encourage him to stay up during the day  Family History  Problem Relation Age of Onset   Hypertension Mother        Passed away when she is 46 secondary to hip surgery complications   CAD Mother    Heart attack Mother    Hypertension Father    Heart attack Maternal Grandmother    Social History   Socioeconomic History   Marital status: Married    Spouse name: Not on file   Number of children: Not on file   Years of education: Not on file   Highest education level: Not on file  Occupational History   Not on file  Tobacco Use   Smoking status: Never   Smokeless tobacco: Never  Vaping Use   Vaping Use: Never used  Substance and Sexual Activity   Alcohol use: Yes    Alcohol/week: 24.0 standard drinks    Types: 24 Cans of beer per week    Comment: 2 beers per day   Drug use: No   Sexual activity: Yes  Other Topics Concern   Not on file  Social History Narrative   Not on file   Social Determinants of Health   Financial Resource Strain: Not on file  Food Insecurity: Not on file  Transportation Needs: Not on file  Physical Activity: Not on file  Stress: Not on file  Social Connections: Not on file   Past Surgical History:  Procedure  Laterality Date   KNEE ARTHROSCOPY Left 04/13/2016   Procedure: ARTHROSCOPY KNEE WITH DEBRIDEMENT;  Surgeon: 04/15/2016, MD;  Location: [facility];  Service: Orthopedics;  Laterality: Left;   KNEE ARTHROSCOPY WITH MEDIAL MENISECTOMY Right 04/13/2016   Procedure: KNEE ARTHROSCOPY WITH MEDIAL MENISECTOMY;  Surgeon: 04/15/2016, MD;  Location:  SURGERY CENTER;  Service: Orthopedics;  Laterality: Right;   NASAL FRACTURE SURGERY     Past Medical History:  Diagnosis Date   Chronic combined systolic and diastolic CHF (congestive heart failure) (HCC)    EF 25-30%   Diastolic dysfunction    Dyslipidemia    Hyperlipidemia    Hypertension    Nonischemic dilated cardiomyopathy (HCC)    felt secondary to HTN with no ischemia on nuclear stress test 05/2012, EF 25-30%   Shortness of breath    There were no vitals taken for this visit.  Opioid Risk Score:   Fall Risk Score:  `1  Depression screen PHQ 2/9  No flowsheet data found.  Review of Systems     Objective:   Physical Exam Not performed as patient was seen via phone today, see recent hospital notes discussing patient's severe  pain       Assessment & Plan:  [name] is a 55 year old man presenting today for f/u of left hemiplegia and right sided low back pain following stroke.  1) Right sided low back pain: -discussed with patient and wife that Xrs show spinal degeneration -pain appears to be more myofascial -trigger point injections with lidocaine provided <1 week of benefit -Would like to try Botox injections for longer relief as this pain is greatly inhibiting patient's function and quality of life -continue heat and lidocaine patch and home physical and occupational therapy -Can take tylenol 500mg  three times per day -minimize use of Norco as much as possible as not benefiting and is a high risk opioid -can continue tizandine as needed.   15 minutes spend in discussing patient's pain  with him and his wife, reviewing what we have tried and potential treatment options, deciding to pursue Botox, can bring him in earlier than next appointment if approved by insurance

## 2020-07-22 NOTE — Progress Notes (Deleted)
Pain Inventory Average Pain {NUMBERS; 0-10:5044} Pain Right Now {NUMBERS; 0-10:5044} My pain is {PAIN DESCRIPTION:21022940}  In the last 24 hours, has pain interfered with the following? General activity {NUMBERS; 0-10:5044} Relation with others {NUMBERS; 0-10:5044} Enjoyment of life {NUMBERS; 0-10:5044} What TIME of day is your pain at its worst? {time of day:24191} Sleep (in general) {BHH GOOD/FAIR/POOR:22877}  Pain is worse with: {ACTIVITIES:21022942} Pain improves with: {PAIN IMPROVES KTGY:56389373} Relief from Meds: {NUMBERS; 0-10:5044}  {MOBILITY SKA:76811572}  {FUNCTION:21022946}  {NEURO/PSYCH:21022948}  {CPRM PRIOR STUDIES:21022953}  {CPRM PHYSICIANS INVOLVED IN YOUR CARE:21022954}    Family History  Problem Relation Age of Onset   Hypertension Mother        Passed away when she is 20 secondary to hip surgery complications   CAD Mother    Heart attack Mother    Hypertension Father    Heart attack Maternal Grandmother    Social History   Socioeconomic History   Marital status: Married    Spouse name: Not on file   Number of children: Not on file   Years of education: Not on file   Highest education level: Not on file  Occupational History   Not on file  Tobacco Use   Smoking status: Never   Smokeless tobacco: Never  Vaping Use   Vaping Use: Never used  Substance and Sexual Activity   Alcohol use: Yes    Alcohol/week: 24.0 standard drinks    Types: 24 Cans of beer per week    Comment: 2 beers per day   Drug use: No   Sexual activity: Yes  Other Topics Concern   Not on file  Social History Narrative   Not on file   Social Determinants of Health   Financial Resource Strain: Not on file  Food Insecurity: Not on file  Transportation Needs: Not on file  Physical Activity: Not on file  Stress: Not on file  Social Connections: Not on file   Past Surgical History:  Procedure Laterality Date   KNEE ARTHROSCOPY Left 04/13/2016   Procedure:  ARTHROSCOPY KNEE WITH DEBRIDEMENT;  Surgeon: Valeria Batman, MD;  Location: [facility];  Service: Orthopedics;  Laterality: Left;   KNEE ARTHROSCOPY WITH MEDIAL MENISECTOMY Right 04/13/2016   Procedure: KNEE ARTHROSCOPY WITH MEDIAL MENISECTOMY;  Surgeon: Valeria Batman, MD;  Location: [facility];  Service: Orthopedics;  Laterality: Right;   NASAL FRACTURE SURGERY     Past Medical History:  Diagnosis Date   Chronic combined systolic and diastolic CHF (congestive heart failure) (HCC)    EF 25-30%   Diastolic dysfunction    Dyslipidemia    Hyperlipidemia    Hypertension    Nonischemic dilated cardiomyopathy (HCC)    felt secondary to HTN with no ischemia on nuclear stress test 05/2012, EF 25-30%   Shortness of breath    There were no vitals taken for this visit.  Opioid Risk Score:   Fall Risk Score:  `1  Depression screen PHQ 2/9  No flowsheet data found.

## 2020-07-23 MED ORDER — GUAIFENESIN ER 600 MG PO TB12
600.0000 mg | ORAL_TABLET | Freq: Two times a day (BID) | ORAL | 3 refills | Status: DC | PRN
Start: 2020-07-23 — End: 2020-09-22

## 2020-07-23 NOTE — Addendum Note (Signed)
Addended by: Horton Chin on: 07/23/2020 07:47 AM   Modules accepted: Orders

## 2020-07-27 ENCOUNTER — Encounter: Payer: 59 | Admitting: Physical Medicine and Rehabilitation

## 2020-07-27 ENCOUNTER — Telehealth: Payer: Self-pay

## 2020-07-27 NOTE — Telephone Encounter (Signed)
Hospital discharge note reviewed per protocol.   Verbal okay given to [facility] Physical Therapist with [facility]. To provide PT twice a week for 3 weeks. To work on safety, independence & balance.

## 2020-07-28 ENCOUNTER — Other Ambulatory Visit (HOSPITAL_COMMUNITY): Payer: Self-pay

## 2020-07-28 ENCOUNTER — Telehealth: Payer: Self-pay

## 2020-07-28 ENCOUNTER — Telehealth: Payer: Self-pay | Admitting: Family

## 2020-07-28 NOTE — Telephone Encounter (Signed)
Chart reviewed per protocol: Verbal okay given for in-home Occupational Therapy to [facility] with [facility]. Services for once a week for one week, twice a week for 4 weeks then once a week for one week.   Call back ph# [phone].

## 2020-07-28 NOTE — Telephone Encounter (Signed)
   [name] DOB: [birth year] MRN: [id number]   RIDER WAIVER AND RELEASE OF LIABILITY  For purposes of improving physical access to our facilities, [facility] is pleased to partner with third parties to provide [place] patients or other authorized individuals the option of convenient, on-demand ground transportation services (the [employer]") through use of the technology service that enables users to request on-demand ground transportation from independent third-party providers.  By opting to use and accept these [employer], I, the undersigned, hereby agree on behalf of myself, and on behalf of any minor child using the Science writer for whom I am the parent or legal guardian, as follows:  Science writer provided to me are provided by independent third-party transportation providers who are not [employer] or employees and who are unaffiliated with [employer]. [facility] is neither a transportation carrier nor a common or public carrier. [facility] has no control over the quality or safety of the transportation that occurs as a result of the [employer]. [facility] cannot guarantee that any third-party transportation provider will complete any arranged transportation service. [facility] makes no representation, warranty, or guarantee regarding the reliability, timeliness, quality, safety, suitability, or availability of any of the Transport Services or that they will be error free. I fully understand that traveling by vehicle involves risks and dangers of serious bodily injury, including permanent disability, paralysis, and death. I agree, on behalf of myself and on behalf of any minor child using the Transport Services for whom I am the parent or legal guardian, that the entire risk arising out of my use of the [employer] remains solely with me, to the maximum extent permitted under applicable law. The [employer] are  provided "as is" and "as available." [facility] disclaims all representations and warranties, express, implied or statutory, not expressly set out in these terms, including the implied warranties of merchantability and fitness for a particular purpose. I hereby waive and release [facility], its agents, employees, officers, directors, representatives, insurers, attorneys, assigns, successors, subsidiaries, and affiliates from any and all past, present, or future claims, demands, liabilities, actions, causes of action, or suits of any kind directly or indirectly arising from acceptance and use of the [employer]. I further waive and release [facility] and its affiliates from all present and future liability and responsibility for any injury or death to persons or damages to property caused by or related to the use of the [employer]. I have read this Waiver and Release of Liability, and I understand the terms used in it and their legal significance. This Waiver is freely and voluntarily given with the understanding that my right (as well as the right of any minor child for whom I am the parent or legal guardian using the [employer]) to legal recourse against  in connection with the [employer] is knowingly surrendered in return for use of these services.   I attest that I read the consent document to [name], gave Mr. Kesecker the opportunity to ask questions and answered the questions asked (if any). I affirm that [name] then provided consent for he's participation in this program.     Farley Ly

## 2020-07-28 NOTE — Progress Notes (Addendum)
TRANSITION OF CARE VISIT   Primary Care Provider (PCP):  Gelena Klosinski Jodi Geralds, NP                                                     [facility] at [facility] [address]     [place],  [place]  [address]      Phone: [phone]       Fax: [phone]   Date of Admission: 06/22/2020  Date of Discharge: 07/21/2020  Transitions of Care Call: 07/22/2020 with Robyne Peers, RN  Discharged from: [facility]  Discharge Diagnosis:  Principal Problem:   Thalamic hemorrhage [facility]) Active Problems:   Acute blood loss anemia   AKI (acute kidney injury) (HCC)   Essential hypertension   Seizures (HCC)   Vascular headache   Dysphagia, post-stroke   Left hemiplegia (HCC)   Cognitive deficit due to recent stroke   Summary of Admission per PA note:  Brief HPI:   [name] is a 55 y.o. male with history of obesity, HTN, ICM, chronic combined CHF was admitted on 06/08/2020 after wife found patient in bathroom with right gaze preference, left-sided weakness and left-sided numbness. Blood pressure was elevated at admission and CT head showed right thalamic hemorrhage with extension into ventricles.  He was started on hypertonic saline and required no breather mask due to lethargy with snoring NAD.  MRI/MRA brain done and was negative for vascular malformation, occlusion or significant stenosis.  MRI brain showed no substantial change in size of hemorrhage and stable leftward shift.  Hospital course has been significant for bouts of agitation felt to be due to alcohol withdrawal and was treated with CIWA protocol.     He continued to have issues with significant headaches and valproic acid added for management.  On 05/27 he was found to have decreasing level of consciousness with LT EEG showing generalized lateralization right hemisphere without definite epileptiform discharges and low dose keppra added due to  concerns of partial complex seizure.  Other issues include AKI with hyperkalemia as well as hyponatremia, dysphagia requiring modified diet as well as issues with headaches.  Patient with dense left hemiplegia with right gaze preference, fatigue as well as visual deficits.  CIR was recommended due to functional decline.     Hospital Course: [name] was admitted to rehab 06/22/2020 for inpatient therapies to consist of PT, ST and OT at least three hours five days a week. Past admission physiatrist, therapy team and rehab RN have worked together to provide customized collaborative inpatient rehab.  His blood pressures were monitored on TID basis and shows good control at this time.  He continues on Keppra twice daily and has been seizure-free during his stay.  He continued to report issues with headaches and was noted to have lethargy therefore Depakote was DC'd and Topamax was added with good results.  Seroquel was added to help with sleep-wake disruption.  He did sustain right ankle sprain that was treated with local measures as well as splinting.  X-ray of ankle was negative for fracture or arthropathy. He has had issues with urgency and hesitancy and Flomax was added with improvement in voiding function.  His p.o. intake has been good and is tolerating dysphagia 3 diet with aspiration precautions.  He did develop left shoulder pain due to mild subluxation and has been educated on range of motion as well as elevation and support in addition to Voltaren gel for pain control.  He did develop right-sided low back pain and hip pain felt to be musculoskeletal in nature.  UA UC was negative for infection.  X-rays of hip and spine showed evidence of  mild OA. Tizanidine was added to help with muscle strain in addition to lidocaine patch for local measures. As tramadol was ineffective with pain management,  he was started on Norco to help with activity tolerance.  Gabapentin also added to help manage low back  pain with neuropathy.  He has been educated on importance of alcohol cessation as well as medication compliance after discharge. Wife and patient have also been educated on body mechanics as well as working on activity out of bed and use of narcotics on prn basis.  He has made slow progress and continues to be limited by left hemiplegia, visual perceptual deficits, cognitive deficits as well as motor planning deficits.  He currently requires mod assist overall and will continue to receive follow-up home health PT, OT and ST by [facility] after discharge.     Rehab course: During patient's stay in rehab weekly team conferences were held to monitor patient's progress, set goals and discuss barriers to discharge. At admission, patient required plus to max assist for limited mobility and max assist with ADL tasks. He exhibited moderate cognitive impairments with mild impairments in orientation and visuospatial tasks and severe impairment in short-term recall.  He also required full supervision at meals due to left pocketing with spillage. He has had improvement in activity tolerance, balance, postural control as well as ability to compensate for deficits. He has had improvement in functional use LUE  and LLE as well as improvement in awareness. He requires set up assist with UB care and min to mod assist with LB care. He requires mod assist for bed mobility, min assist for tranfers and is able to ambulate 52' with +2 HHA and tactile cues for weight shifting, posture and sequencing. Family education was completed regarding all aspects of safety and care.    Disposition: Home   Diet: Dysphagia 3, heart healthy--monitor for left pocketing.    Special Instructions: 1.  Need to work on activity out of bed. 2.  Use heat and ice as needed to shoulder and back.  Do not apply heat to lidocaine patches. 3.  Avoid aspirin, NSAIDs and alcohol.  Follow-ups: [facility] for hospital follow-up   Sula Soda, MD at Physical Medicine and Rehabilitation   TODAY's VISIT THALAMIC HEMORRHAGE FOLLOW-UP: Patient unable to stand for longer than a few minutes without getting tired. [facility] had initial assessment visit with the patient in the home on yesterday. Reports plans for PT/OT/ST to begin in the home setting on next week. Speech and memory have improved since hospital discharge. He is eating a regular diet and denies issues with chewing and swallowing. Since hospital discharge denies consumption of alcohol or illicit substance use. Patient and wife are aware of upcoming appointments with Physical Medicine and Neurology. Wife, [name], assisting with all activities of daily living and transfers. She is requesting provider to complete FMLA paperwork for her and disability paperwork for the patient.   HYPERTENSION FOLLOW-UP: Currently taking: Amlodipine, Clonidine, Labetalol, Losartan, and Atorvastatin (for stroke) Med Adherence: [x]  Yes    []  No Medication side effects: []  Yes    [  x] No Adherence with salt restriction (low-salt diet): [x]  Yes    []  No Home Monitoring?: [x]  Yes    []  No Monitoring Frequency: [x]  Yes    []  No Home BP results range: [x]  Yes, 120's-130's/70's-80's Smoking []  Yes [x]  No SOB? []  Yes    [x]  No Chest Pain?: []  Yes    [x]  No  3. SEIZURES FOLLOW-UP: No seizures or seizure-like activity since hospital discharge. Denies headaches. Still taking Keppra and Topamax. No longer needing Seroquel as he is resting better since being home from the hospital.  4. RIGHT ANKLE SPRAIN: No further concerns since hospital discharge.  5. LEFT SHOULDER PAIN: No further concerns since hospital discharge.  6. RIGHT-SIDED LOW BACK PAIN AND HIP PAIN: Still having back pain. Using Tizanidine and Gabapentin.   7. URINATION CONCERNS: Reports sometimes when he goes to the bathroom he has an urge to urinate but unable to do so. Endorses taking Flomax.     Patient/Caregiver self-reported problems/concerns: Refer to today's visit HPI  MEDICATIONS  Medication Reconciliation conducted with patient/caregiver? (Yes/ No):Yes  New medications prescribed/discontinued upon discharge? (Yes/No): Yes  Barriers identified related to medications: No  LABS  Lab Reviewed (Yes/No/NA): Yes  PHYSICAL EXAM:  Physical Exam HENT:     Head: Normocephalic and atraumatic.     Comments: Smile and frown symmetrical. Tongue midline with extension.  Eyes:     Extraocular Movements: Extraocular movements intact.     Conjunctiva/sclera: Conjunctivae normal.     Pupils: Pupils are equal, round, and reactive to light.  Cardiovascular:     Rate and Rhythm: Normal rate and regular rhythm.     Pulses: Normal pulses.     Heart sounds: Normal heart sounds.  Musculoskeletal:     Cervical back: Normal range of motion and neck supple.     Comments: Right upper extremity and right lower extremity motor strength 4/5. Left upper extremity and left lower extremity motor strength 2/5.  Neurological:     Mental Status: He is alert and oriented to person, place, and time.  Psychiatric:        Mood and Affect: Mood normal.        Behavior: Behavior normal.   ASSESSMENT: 1. Encounter to establish care: - Patient presents today to establish care.  - Return for annual physical examination, labs, and health maintenance. Arrive fasting meaning having no food for at least 8 hours prior to appointment. You may have only water or black coffee. Please take scheduled medications as normal.  2. Hospital discharge follow-up: - Reviewed hospital course, current medications, ensured proper follow-up in place, and addressed concerns.  - Wife requesting provider to complete FMLA paperwork for her and disability paperwork for patient. Reports will return requested documents to office at a later date.   3. Thalamic hemorrhage (HCC): 4. Left hemiplegia (HCC): 5. Cognitive deficit  due to recent stroke: 6. Aphasia due to recent stroke: 7. Dysphagia, post-stroke: - Patient with persistent left hemiplegia.  - Patient reports memory and speech have improved since hospital discharge. Eating regular diet without complication.  - Continue Atorvastatin as prescribed.  - Keep appointment with [facility] scheduled 09/01/2020 with Ihor Austin, NP.  - Keep appointments with Physical Medicine scheduled on 08/04/2020 with Jacalyn Lefevre, NP and 09/17/2020 with Sula Soda, MD. - Keep appointments as scheduled with [facility] for PT/OT/ST. - atorvastatin (LIPITOR) 40 MG tablet; Take 1 tablet (40 mg total) by mouth daily at 6 PM.  Dispense: 120 tablet;  Refill: 0  8. Acute blood loss anemia: - Repeat CBC to screen for anemia. - CBC; Future  9. AKI (acute kidney injury) (HCC): - GFR normal on 07/19/2020 prior to hospital discharge.  10. Essential hypertension: - Blood pressure at goal during today's office visit.  - Continue Amlodipine, Clonidine, Labetalol, and Losartan as prescribed.  - Counseled on blood pressure goal of less than 130/80, low-sodium, DASH diet, medication compliance, 150 minutes of moderate intensity exercise per week as tolerated. Discussed medication compliance, adverse effects. - Follow-up with primary provider in 3 months or sooner if needed.  - amLODipine (NORVASC) 10 MG tablet; Take 1 tablet (10 mg total) by mouth daily.  Dispense: 90 tablet; Refill: 0 - cloNIDine (CATAPRES) 0.1 MG tablet; Take 1 tablet (0.1 mg total) by mouth 3 (three) times daily.  Dispense: 90 tablet; Refill: 0 - labetalol (NORMODYNE) 100 MG tablet; Take 1 tablet (100 mg total) by mouth 2 (two) times daily.  Dispense: 180 tablet; Refill: 0 - losartan (COZAAR) 50 MG tablet; Take 1 tablet (50 mg total) by mouth 2 (two) times daily.  Dispense: 180 tablet; Refill: 0  11. Seizures (HCC): 12. Vascular headache: - Continue Levetiracetam and Topiramate as  prescribed.  - Keep appointment with [facility] scheduled 09/01/2020 with Ihor Austin, NP.  - levETIRAcetam (KEPPRA) 500 MG tablet; Take 1 tablet (500 mg total) by mouth 2 (two) times daily.  Dispense: 180 tablet; Refill: 0 - topiramate (TOPAMAX) 25 MG tablet; Take 2 tablets (50 mg total) by mouth daily.  Dispense: 180 tablet; Refill: 0  13. Acute right-sided low back pain, unspecified whether sciatica present: - On 07/14/2020 diagnostic left hip x-ray and lumbar spine resulted with mild degenerative changes.  - Continue Tizanidine an Gabapentin as prescribed.  - Keep appointments with Physical Medicine scheduled on 08/04/2020 with Jacalyn Lefevre, NP and 09/17/2020 with Sula Soda, MD. - tiZANidine (ZANAFLEX) 2 MG tablet; Take 1 tablet (2 mg total) by mouth 3 (three) times daily.  Dispense: 90 tablet; Refill: 2 - gabapentin (NEURONTIN) 100 MG capsule; Take 2 capsules (200 mg total) by mouth 3 (three) times daily.  Dispense: 180 capsule; Refill: 2  14. Urinary hesitancy: 15. Urinary urgency: 16. Prostate cancer screening: - Continue Tamsulosin as prescribed.  - PSA to screen for prostate function.  - Referral to Urology for further evaluation and management.  - PSA; Future - tamsulosin (FLOMAX) 0.4 MG CAPS capsule; Take 1 capsule (0.4 mg total) by mouth daily after supper.  Dispense: 90 capsule; Refill: 0 - Ambulatory referral to Urology  PATIENT EDUCATION PROVIDED: See AVS   FOLLOW-UP (Include any further testing or referrals): Primary Care, Neurology, Urology, and Physical Medicine as scheduled.

## 2020-07-29 ENCOUNTER — Other Ambulatory Visit (HOSPITAL_COMMUNITY): Payer: Self-pay

## 2020-07-30 ENCOUNTER — Telehealth: Payer: Self-pay

## 2020-07-30 ENCOUNTER — Ambulatory Visit (INDEPENDENT_AMBULATORY_CARE_PROVIDER_SITE_OTHER): Payer: 59 | Admitting: Family

## 2020-07-30 ENCOUNTER — Other Ambulatory Visit: Payer: Self-pay

## 2020-07-30 ENCOUNTER — Encounter: Payer: Self-pay | Admitting: Family

## 2020-07-30 VITALS — BP 126/85 | HR 84 | Temp 98.1°F | Resp 16 | Ht 70.0 in

## 2020-07-30 DIAGNOSIS — G441 Vascular headache, not elsewhere classified: Secondary | ICD-10-CM

## 2020-07-30 DIAGNOSIS — I69391 Dysphagia following cerebral infarction: Secondary | ICD-10-CM

## 2020-07-30 DIAGNOSIS — M545 Low back pain, unspecified: Secondary | ICD-10-CM

## 2020-07-30 DIAGNOSIS — N179 Acute kidney failure, unspecified: Secondary | ICD-10-CM

## 2020-07-30 DIAGNOSIS — Z125 Encounter for screening for malignant neoplasm of prostate: Secondary | ICD-10-CM

## 2020-07-30 DIAGNOSIS — Z09 Encounter for follow-up examination after completed treatment for conditions other than malignant neoplasm: Secondary | ICD-10-CM

## 2020-07-30 DIAGNOSIS — R3915 Urgency of urination: Secondary | ICD-10-CM

## 2020-07-30 DIAGNOSIS — D62 Acute posthemorrhagic anemia: Secondary | ICD-10-CM

## 2020-07-30 DIAGNOSIS — R3911 Hesitancy of micturition: Secondary | ICD-10-CM

## 2020-07-30 DIAGNOSIS — I69320 Aphasia following cerebral infarction: Secondary | ICD-10-CM

## 2020-07-30 DIAGNOSIS — I610 Nontraumatic intracerebral hemorrhage in hemisphere, subcortical: Secondary | ICD-10-CM | POA: Diagnosis not present

## 2020-07-30 DIAGNOSIS — I69319 Unspecified symptoms and signs involving cognitive functions following cerebral infarction: Secondary | ICD-10-CM

## 2020-07-30 DIAGNOSIS — R569 Unspecified convulsions: Secondary | ICD-10-CM

## 2020-07-30 DIAGNOSIS — G8194 Hemiplegia, unspecified affecting left nondominant side: Secondary | ICD-10-CM

## 2020-07-30 DIAGNOSIS — Z7689 Persons encountering health services in other specified circumstances: Secondary | ICD-10-CM

## 2020-07-30 DIAGNOSIS — I10 Essential (primary) hypertension: Secondary | ICD-10-CM

## 2020-07-30 MED ORDER — TIZANIDINE HCL 2 MG PO TABS: 2.0000 mg | ORAL_TABLET | Freq: Three times a day (TID) | ORAL | 2 refills | Status: DC

## 2020-07-30 MED ORDER — LOSARTAN POTASSIUM 50 MG PO TABS: 50.0000 mg | ORAL_TABLET | Freq: Two times a day (BID) | ORAL | 0 refills | Status: DC

## 2020-07-30 MED ORDER — TOPIRAMATE 25 MG PO TABS: 50.0000 mg | ORAL_TABLET | Freq: Every day | ORAL | 0 refills | Status: DC

## 2020-07-30 MED ORDER — LEVETIRACETAM 500 MG PO TABS: 500.0000 mg | ORAL_TABLET | Freq: Two times a day (BID) | ORAL | 0 refills | Status: DC

## 2020-07-30 MED ORDER — AMLODIPINE BESYLATE 10 MG PO TABS: 10.0000 mg | ORAL_TABLET | Freq: Every day | ORAL | 0 refills | Status: DC

## 2020-07-30 MED ORDER — ATORVASTATIN CALCIUM 40 MG PO TABS: 40.0000 mg | ORAL_TABLET | Freq: Every day | ORAL | 0 refills | Status: DC

## 2020-07-30 MED ORDER — LABETALOL HCL 100 MG PO TABS: 100.0000 mg | ORAL_TABLET | Freq: Two times a day (BID) | ORAL | 0 refills | Status: DC

## 2020-07-30 MED ORDER — GABAPENTIN 100 MG PO CAPS: 200.0000 mg | ORAL_CAPSULE | Freq: Three times a day (TID) | ORAL | 2 refills | Status: DC

## 2020-07-30 MED ORDER — CLONIDINE HCL 0.1 MG PO TABS
0.1000 mg | ORAL_TABLET | Freq: Three times a day (TID) | ORAL | 0 refills | Status: DC
Start: 2020-07-30 — End: 2020-09-22

## 2020-07-30 MED ORDER — TAMSULOSIN HCL 0.4 MG PO CAPS
0.4000 mg | ORAL_CAPSULE | Freq: Every day | ORAL | 0 refills | Status: AC
Start: 2020-07-30 — End: 2020-10-28

## 2020-07-30 NOTE — Progress Notes (Signed)
Pt presents for HFU accompanied by wife [name], pt has been having issues with low back pain

## 2020-07-30 NOTE — Telephone Encounter (Signed)
Theora Gianotti, speech therapist with [facility] called for a verbal order, for speech. [name] discharge summary was reviewed. Okay given for speech therapy starting today. Then once a week for 9 weeks.

## 2020-07-30 NOTE — Patient Instructions (Signed)
Hemorrhagic Stroke  A hemorrhagic stroke happens when a blood vessel in the brain leaks or bursts (ruptures). This causes bleeding in or around the brain (hemorrhage) and leads to the sudden death of brain tissue. A hemorrhagic stroke is amedical emergency. It can cause brain damage and death. There are two major types of hemorrhagic stroke: Intracerebral hemorrhage. This happens when bleeding occurs within the brain tissue. Subarachnoid hemorrhage. This happens when bleeding occurs in the area between the brain and the membrane that covers the brain (subarachnoid space). What are the causes? This condition may be caused by: Head injury (trauma). Part of a weakened blood vessel wall bulging or ballooning out (cerebral aneurysm). Thin and hardened blood vessels due to plaque buildup. Tangled blood vessels in the brain (arteriovenous malformation). Protein buildup on the artery walls of the brain (amyloid angiopathy). Inflamed blood vessels (vasculitis). A brain tumor. Sometimes, the cause of this condition is not known. What increases the risk? The following factors may make you more likely to develop this condition: Hypertension. Having abnormal blood vessels present since birth (congenital abnormality). Bleeding disorders, such as hemophilia, sickle cell disease, or liver disease. Blood thinners (anticoagulants) that make the blood too thin. Being an older adult. Moderate or heavy alcohol use. Using drugs, such as cocaine or methamphetamines. What are the signs or symptoms? Symptoms of this condition include: The sudden onset of: Weakness or numbness of the face, arm, or leg, especially on one side of the body. Confusion. Trouble speaking or understanding speech. Difficulty seeing in one or both eyes. Difficulty walking or moving the arms or legs. Dizziness, or loss of balance or coordination. Nausea and vomiting. A severe headache with no known cause. This headache may feel like  the worst one a person has ever had. Seizures. How is this diagnosed? This condition may be diagnosed based on: Your symptoms, medical history, and a physical exam. Tests, including: Blood tests. CT scan. MRI. CT angiography (CTA) or magnetic resonance angiography (MRA). Catheter angiogram. In this procedure, dye is injected through a long, thin tube (catheter) into one of your arteries. X-rays are taken and will show whether a blockage or a problem in a blood vessel exists. How is this treated? The goals of treatment are to stop the bleeding, reduce pressure on the brain, relieve symptoms, and prevent complications. Treatment may include: Medicines that do the following: Lower blood pressure (antihypertensives). Relieve pain (analgesics), fever, nausea, or vomiting. Stop or prevent seizures (anticonvulsants). Prevent blood vessels in the brain from spasming in response to bleeding. Control bleeding in the brain. Use of a machine to help you breathe (ventilator). A blood transfusion to help your blood clot. Placement of a tube (shunt) in the brain to relieve pressure. Physical, speech, or occupational therapy. Surgery to stop the bleeding, remove a blood clot or tumor, or reduce pressure. Treatment depends on the cause, severity, and duration of symptoms. Talk withyour health care provider about what to expect during your recovery. Follow these instructions at home: Activity Use a walker or a cane as told by your health care provider. Return to your normal activities as told by your health care provider. Ask your health care provider what activities are safe for you. Rest to help your brain heal. Make sure you: Get plenty of sleep. Avoid staying up late at night. Keep to a sleep schedule. Go to sleep and wake up at about the same time every day. Avoid activities that cause physical or mental stress. Lifestyle Do not drink   alcohol if: Your health care provider tells you not to  drink. You are pregnant, may be pregnant, or are planning to become pregnant. If you drink alcohol: Limit how much you use to: 0-1 drink a day for women. 0-2 drinks a day for men. Know how much alcohol is in your drink. In the [place] one drink equals one 12 oz bottle of beer (355 mL), one 5 oz glass of wine (148 mL), or one 1 oz glass of hard liquor (44 mL). General instructions Do not use any products that contain nicotine or tobacco. These include cigarettes, chewing tobacco, and vaping devices, such as e-cigarettes. If you need help quitting, ask your health care provider. Do not drive or use heavy machinery until your health care provider approves. Take over-the-counter and prescription medicines only as told by your health care provider. Keep all follow-up visits, including those with therapists. This is important. How is this prevented? You can reduce your risk of stroke by managing conditions, such as: High blood pressure. High cholesterol. Diabetes. Heart disease. Obesity. Other factors and lifestyle changes that can lower your risk include: Quitting smoking, limiting alcohol, and staying physically active. Having your bloodwork monitored frequently if you take anticoagulants (blood thinners). Contact a health care provider if: You develop any of the following symptoms: Headaches that keep coming back (that are chronic). Nausea. Vision problems. Increased sensitivity to noise or light. Depression, mood swings, anxiety, or irritability. Memory problems. Difficulty concentrating or paying attention. Sleep problems. Feeling tired all of the time. Get help right away if: You have a partial or total loss of consciousness. You are taking blood thinners and you fall or have a minor injury to the head. You have a bleeding disorder and you fall, or you have a minor trauma to the head. You have any symptoms of a stroke. "BE FAST" is an easy way to remember the main warning signs of  a stroke: B - Balance. Signs are dizziness, sudden trouble walking, or loss of balance. E - Eyes. Signs are trouble seeing or a sudden change in vision. F - Face. Signs are sudden weakness or numbness of the face, or the face or eyelid drooping on one side. A - Arms. Signs are weakness or numbness in an arm. This happens suddenly and usually on one side of the body. S - Speech. Signs are sudden trouble speaking, slurred speech, or trouble understanding what people say. T - Time. Time to call emergency services. Write down what time symptoms started. You have other signs of a stroke, such as: A sudden, severe headache with no known cause. Nausea or vomiting. Seizure. These symptoms may represent a serious problem that is an emergency. Do not wait to see if the symptoms will go away. Get medical help right away. Call your local emergency services (911 in the [place] Do not drive yourself to the hospital. Summary Hemorrhagic stroke is caused by bleeding in or around the brain. Hemorrhagic stroke is a medical emergency. Know the signs and symptoms of stroke. You can reduce your risk of stroke by managing conditions, quitting smoking, and making other lifestyle changes. This information is not intended to replace advice given to you by your health care provider. Make sure you discuss any questions you have with your healthcare provider. Document Revised: 10/14/2019 Document Reviewed: 10/14/2019 Elsevier Patient Education  2022 Elsevier Inc.  

## 2020-08-04 ENCOUNTER — Encounter: Payer: 59 | Admitting: Registered Nurse

## 2020-08-05 ENCOUNTER — Other Ambulatory Visit (HOSPITAL_COMMUNITY): Payer: Self-pay

## 2020-08-05 DIAGNOSIS — E785 Hyperlipidemia, unspecified: Secondary | ICD-10-CM

## 2020-08-05 DIAGNOSIS — R1310 Dysphagia, unspecified: Secondary | ICD-10-CM

## 2020-08-05 DIAGNOSIS — I428 Other cardiomyopathies: Secondary | ICD-10-CM

## 2020-08-05 DIAGNOSIS — E669 Obesity, unspecified: Secondary | ICD-10-CM

## 2020-08-05 DIAGNOSIS — I69318 Other symptoms and signs involving cognitive functions following cerebral infarction: Secondary | ICD-10-CM

## 2020-08-05 DIAGNOSIS — I5042 Chronic combined systolic (congestive) and diastolic (congestive) heart failure: Secondary | ICD-10-CM

## 2020-08-05 DIAGNOSIS — I69391 Dysphagia following cerebral infarction: Secondary | ICD-10-CM

## 2020-08-05 DIAGNOSIS — I69354 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-dominant side: Secondary | ICD-10-CM | POA: Diagnosis not present

## 2020-08-05 DIAGNOSIS — N179 Acute kidney failure, unspecified: Secondary | ICD-10-CM

## 2020-08-05 DIAGNOSIS — R569 Unspecified convulsions: Secondary | ICD-10-CM

## 2020-08-05 DIAGNOSIS — D62 Acute posthemorrhagic anemia: Secondary | ICD-10-CM

## 2020-08-05 DIAGNOSIS — I110 Hypertensive heart disease with heart failure: Secondary | ICD-10-CM | POA: Diagnosis not present

## 2020-08-05 DIAGNOSIS — G441 Vascular headache, not elsewhere classified: Secondary | ICD-10-CM

## 2020-08-05 DIAGNOSIS — I69322 Dysarthria following cerebral infarction: Secondary | ICD-10-CM

## 2020-08-06 ENCOUNTER — Telehealth: Payer: Self-pay | Admitting: Physical Medicine and Rehabilitation

## 2020-08-06 NOTE — Telephone Encounter (Signed)
FMLA paperwork is on Dr. Richmond Campbell desk . We already received payment for. As soon as Dr. Carlis Abbott finish it ,please fax form to HR/ HR  fax number is listed on FMLA form .

## 2020-08-09 ENCOUNTER — Other Ambulatory Visit (HOSPITAL_COMMUNITY): Payer: Self-pay

## 2020-08-09 ENCOUNTER — Telehealth: Payer: Self-pay | Admitting: Family

## 2020-08-09 NOTE — Telephone Encounter (Signed)
Patients wife called in stating patient needs medication refills  folic acid (FOLVITE) 1 MG tablet  senna-docusate (SENOKOT-S) 8.6-50 MG tablet  thiamine 100 MG tablet Multiple Vitamins-Minerals (CERTAVITE/ANTIOXIDANTS) TABS    Pharmacy  [facility] [address]), [place] - [address]  [address] [place] ([place]) [place] [address]  Phone:  [phone]  Fax:  [phone]

## 2020-08-10 ENCOUNTER — Other Ambulatory Visit: Payer: Self-pay

## 2020-08-10 DIAGNOSIS — M545 Low back pain, unspecified: Secondary | ICD-10-CM

## 2020-08-10 DIAGNOSIS — I69319 Unspecified symptoms and signs involving cognitive functions following cerebral infarction: Secondary | ICD-10-CM

## 2020-08-10 DIAGNOSIS — L309 Dermatitis, unspecified: Secondary | ICD-10-CM

## 2020-08-10 MED ORDER — FOLIC ACID 1 MG PO TABS: 1.0000 mg | ORAL_TABLET | Freq: Every day | ORAL | 0 refills | Status: DC

## 2020-08-10 MED ORDER — HYDROCERIN EX CREA: 1.0000 "application " | TOPICAL_CREAM | Freq: Two times a day (BID) | CUTANEOUS | 0 refills | Status: DC

## 2020-08-10 MED ORDER — LIDOCAINE 5 % EX PTCH: 2.0000 | MEDICATED_PATCH | CUTANEOUS | 0 refills | Status: DC

## 2020-08-10 NOTE — Progress Notes (Signed)
Medication refilled

## 2020-08-13 ENCOUNTER — Telehealth: Payer: Self-pay

## 2020-08-13 NOTE — Telephone Encounter (Signed)
Byrd Hesselbach PT from [facility] called requesting verbal orders for HHPT 3wk1, 2wk1, and then referral to Outpt rehab Orders approved and given

## 2020-08-16 ENCOUNTER — Other Ambulatory Visit: Payer: Self-pay

## 2020-08-16 ENCOUNTER — Other Ambulatory Visit: Payer: Self-pay | Admitting: Family

## 2020-08-16 DIAGNOSIS — I10 Essential (primary) hypertension: Secondary | ICD-10-CM

## 2020-08-17 ENCOUNTER — Telehealth: Payer: Self-pay | Admitting: Family

## 2020-08-17 ENCOUNTER — Encounter: Payer: Self-pay | Admitting: Physical Medicine and Rehabilitation

## 2020-08-17 NOTE — Telephone Encounter (Signed)
Pt request for  Rx #: [id number]  senna-docusate (SENOKOT-S) 8.6-50 MG tablet [[id number]]  labetalol (NORMODYNE) 100 MG tablet [[id number]]  Rx #: [id number]  thiamine 100 MG tablet [[id number]]  Pharmacy  [facility] [address] [place]), [place] - [address]  [address] [place] ([place]) [place] [address]  Phone:  [phone]  Fax:  [phone]

## 2020-08-18 ENCOUNTER — Other Ambulatory Visit (HOSPITAL_COMMUNITY): Payer: Self-pay

## 2020-08-19 ENCOUNTER — Other Ambulatory Visit: Payer: Self-pay

## 2020-08-19 ENCOUNTER — Other Ambulatory Visit (HOSPITAL_COMMUNITY): Payer: Self-pay

## 2020-08-19 DIAGNOSIS — K5903 Drug induced constipation: Secondary | ICD-10-CM

## 2020-08-19 DIAGNOSIS — G8194 Hemiplegia, unspecified affecting left nondominant side: Secondary | ICD-10-CM

## 2020-08-19 NOTE — Telephone Encounter (Signed)
Spoke to pt wife Albertina Parr and advised her that insurance will not pay for Senokot or Thiamine she can purchase OTC that will work the same. Advised her that the pharmacy confirmed receipt of 90 day supply of Labetalol on 07/30/20, per [name] she will check with pharmacy

## 2020-08-24 ENCOUNTER — Other Ambulatory Visit: Payer: Self-pay

## 2020-08-24 ENCOUNTER — Encounter: Payer: 59 | Attending: Physical Medicine and Rehabilitation | Admitting: Physical Medicine and Rehabilitation

## 2020-08-24 ENCOUNTER — Inpatient Hospital Stay: Payer: 59 | Admitting: Family

## 2020-08-24 DIAGNOSIS — I610 Nontraumatic intracerebral hemorrhage in hemisphere, subcortical: Secondary | ICD-10-CM | POA: Diagnosis not present

## 2020-08-24 DIAGNOSIS — M25512 Pain in left shoulder: Secondary | ICD-10-CM | POA: Insufficient documentation

## 2020-08-24 DIAGNOSIS — M79672 Pain in left foot: Secondary | ICD-10-CM | POA: Diagnosis not present

## 2020-08-24 DIAGNOSIS — G894 Chronic pain syndrome: Secondary | ICD-10-CM | POA: Insufficient documentation

## 2020-08-24 DIAGNOSIS — Z79891 Long term (current) use of opiate analgesic: Secondary | ICD-10-CM | POA: Insufficient documentation

## 2020-08-24 DIAGNOSIS — M545 Low back pain, unspecified: Secondary | ICD-10-CM | POA: Insufficient documentation

## 2020-08-24 DIAGNOSIS — G8194 Hemiplegia, unspecified affecting left nondominant side: Secondary | ICD-10-CM | POA: Insufficient documentation

## 2020-08-24 DIAGNOSIS — L309 Dermatitis, unspecified: Secondary | ICD-10-CM | POA: Insufficient documentation

## 2020-08-24 DIAGNOSIS — G9059 Complex regional pain syndrome I of other specified site: Secondary | ICD-10-CM | POA: Insufficient documentation

## 2020-08-24 DIAGNOSIS — Z5181 Encounter for therapeutic drug level monitoring: Secondary | ICD-10-CM | POA: Insufficient documentation

## 2020-08-24 MED ORDER — GABAPENTIN 300 MG PO CAPS: 300.0000 mg | ORAL_CAPSULE | Freq: Every day | ORAL | 1 refills | Status: DC

## 2020-08-24 NOTE — Progress Notes (Signed)
Subjective:    Patient ID: [name], male    DOB: 19-Feb-1965, 55 y.o.   MRN: [id number]  HPI An audio/video tele-health visit is felt to be the most appropriate encounter for this patient at this time. This is a follow up tele-visit via phone. The patient is at home. MD is at office.    [name] presents for follow-up of left sided hemiplegia, right sided lower back pain, and left sided foot pain following CVA -pain has been severe at home, as it had been in hospital -his wife purchased 500mg  tablets of tylenol -he has a one week supply of Norco -the benefits from the trigger point injections last week have worn off -pain is severely limiting his function -he is taking Tizanidine and it does not help, does make him sleepy, but his wife is trying to encourage him to stay up during the day -he developed the foot pain a couple of weeks ago -he was wearing a boot at that time so took it off but has still had pain a couple of weeks afterward. -he has not tried anything for this pain -it is worst at night and feels like burning and tingling at his heel. -he has been doing great with therapy and is walking in his home! -his wife asks if he could get a handicap placard.   Family History  Problem Relation Age of Onset   Hypertension Mother        Passed away when she is 70 secondary to hip surgery complications   CAD Mother    Heart attack Mother    Hypertension Father    Heart attack Maternal Grandmother    Social History   Socioeconomic History   Marital status: Married    Spouse name: Not on file   Number of children: Not on file   Years of education: Not on file   Highest education level: Not on file  Occupational History   Not on file  Tobacco Use   Smoking status: Never   Smokeless tobacco: Never  Vaping Use   Vaping Use: Never used  Substance and Sexual Activity   Alcohol use: Yes    Alcohol/week: 24.0 standard drinks    Types: 24 Cans of beer per week     Comment: 2 beers per day   Drug use: No   Sexual activity: Yes  Other Topics Concern   Not on file  Social History Narrative   Not on file   Social Determinants of Health   Financial Resource Strain: Not on file  Food Insecurity: Not on file  Transportation Needs: Not on file  Physical Activity: Not on file  Stress: Not on file  Social Connections: Not on file   Past Surgical History:  Procedure Laterality Date   KNEE ARTHROSCOPY Left 04/13/2016   Procedure: ARTHROSCOPY KNEE WITH DEBRIDEMENT;  Surgeon: 04/15/2016, MD;  Location: [facility];  Service: Orthopedics;  Laterality: Left;   KNEE ARTHROSCOPY WITH MEDIAL MENISECTOMY Right 04/13/2016   Procedure: KNEE ARTHROSCOPY WITH MEDIAL MENISECTOMY;  Surgeon: 04/15/2016, MD;  Location: [facility];  Service: Orthopedics;  Laterality: Right;   NASAL FRACTURE SURGERY     Past Medical History:  Diagnosis Date   Chronic combined systolic and diastolic CHF (congestive heart failure) (HCC)    EF 25-30%   Diastolic dysfunction    Dyslipidemia    Hyperlipidemia    Hypertension    Nonischemic dilated cardiomyopathy (HCC)  felt secondary to HTN with no ischemia on nuclear stress test 05/2012, EF 25-30%   Shortness of breath    There were no vitals taken for this visit.  Opioid Risk Score:   Fall Risk Score:  `1  Depression screen PHQ 2/9  Depression screen PHQ 2/9 07/30/2020  Decreased Interest 0  Down, Depressed, Hopeless 0  PHQ - 2 Score 0  Altered sleeping 0  Tired, decreased energy 0  Change in appetite 0  Feeling bad or failure about yourself  0  Trouble concentrating 0  Moving slowly or fidgety/restless 0  Suicidal thoughts 0  PHQ-9 Score 0  Difficult doing work/chores Not difficult at all    Review of Systems     Objective:   Physical Exam Not performed as patient was seen via phone today     Assessment & Plan:  [name] is a 55 year old man presenting today for f/u  of left hemiplegia, right sided low back pain, and left sided foot pain following stroke.  1) Right sided low back pain: -discussed with patient and wife that Xrs show spinal degeneration -pain appears to be more myofascial -trigger point injections with lidocaine provided <1 week of benefit -Would like to try Botox injections for longer relief as this pain is greatly inhibiting patient's function and quality of life -continue heat and lidocaine patch and home physical and occupational therapy -Can take tylenol 500mg  three times per day -minimize use of Norco as much as possible as not benefiting and is a high risk opioid -can continue tizandine as needed.   2) left sided foot pain -prescribed gabapentin 300mg  HS -recommended icing 15 minutes three times per day -can keep compression garment and boot off since these were worsening his pain  3) CVA -will fill out a handicap application for him and his wife will pick up on Friday.   10 minutes spent in discussion of his left foot pain, when it is worst, where pain is located, discussion of trial of ice and gabapentin, picking up handicap placard on Friday

## 2020-09-01 ENCOUNTER — Ambulatory Visit (INDEPENDENT_AMBULATORY_CARE_PROVIDER_SITE_OTHER): Payer: 59 | Admitting: Adult Health

## 2020-09-01 ENCOUNTER — Other Ambulatory Visit: Payer: Self-pay

## 2020-09-01 ENCOUNTER — Encounter: Payer: Self-pay | Admitting: Adult Health

## 2020-09-01 VITALS — BP 142/81 | HR 77 | Ht 70.0 in | Wt 230.0 lb

## 2020-09-01 DIAGNOSIS — I619 Nontraumatic intracerebral hemorrhage, unspecified: Secondary | ICD-10-CM

## 2020-09-01 DIAGNOSIS — E785 Hyperlipidemia, unspecified: Secondary | ICD-10-CM | POA: Diagnosis not present

## 2020-09-01 DIAGNOSIS — I10 Essential (primary) hypertension: Secondary | ICD-10-CM | POA: Diagnosis not present

## 2020-09-01 DIAGNOSIS — R569 Unspecified convulsions: Secondary | ICD-10-CM

## 2020-09-01 MED ORDER — LEVETIRACETAM 500 MG PO TABS: 500.0000 mg | ORAL_TABLET | Freq: Two times a day (BID) | ORAL | 3 refills | Status: DC

## 2020-09-01 MED ORDER — GABAPENTIN 300 MG PO CAPS: 300.0000 mg | ORAL_CAPSULE | Freq: Two times a day (BID) | ORAL | 5 refills | Status: DC

## 2020-09-01 NOTE — Progress Notes (Signed)
[facility] [address] [place]. [place] [address] [phone]       HOSPITAL FOLLOW UP NOTE  Mr. Orlan Aversa [name] Date of Birth:  Jul 01, 1965 Medical Record Number:  [id number]   Reason for Referral:  hospital stroke follow up    SUBJECTIVE:   CHIEF COMPLAINT:  Chief Complaint  Patient presents with   Follow-up    RM 3 with spouse cheryl  Pt is having L eye vision complications, L sided numbness, tightness and pain, had previous damage to shoulder but problems have worsen since stroke on L side. Also Pain in back Spouse states his short term memory has not been the best     HPI:   Mr. Xaine Sansom [name] is a 55 y.o. male with history of hypertension, obesity, cardiomyopathy, hyperlipidemia, CHF admitted on 06/09/2020 for right-sided gaze, right-sided weakness numbness and fall.  Personally reviewed hospitalization pertinent progress notes, lab work and imaging.  Evaluated by Dr. Erlinda Hong for right thalamic ICH with IVH with midline shift and edema likely due to uncontrolled HTN.  BP treated with Cleviprex with adjustments made to home regimen. MRA no vascular malformations. CUS unremarkable.  EF 65 to 70%.  UDS negative.  LDL 78 on atorvastatin 40 mg daily.  A1c 5.5.  Other stroke risk factors include EtOH use, obesity and suspected sleep apnea.  Stroke team signed off on 5/24 but reevaluated on 5/27 due to altered mental status unresponsive to verbal and tactile stimuli, neck jerking to the left and left gaze deviation likely partial complex seizure.  Patient on Depakote for headache prophylaxis with Depakote level 45. CT head stable ICH and IVH with mild hydrocephalus.  EEG and LTM EEG no evidence of seizures.  Placed on Keppra 500 mg twice daily for seizure prophylaxis.  No additional seizures.  Hospital course complicated by sundowning placed on Seroquel, AKI and headaches.  Therapies recommended CIR for residual dense left hemiplegia, right gaze preference, dysphagia,  fatigue and visual deficits. CIR admission 5/31-6/29 -significant for left shoulder pain due to mild subluxation, right-sided low back and hip pain and headaches.  Today, 09/01/2020, Mr. Girgis is being seen for hospital follow-up accompanied by his wife, [name].  Reports residual left-sided weakness, left shoulder pain and left foot pain, right side back and hip pain, and occasional blurred vision in left periphery. Currently working with [facility] PT/OT/SLP.  He ambulates with hemiwalker during therapy sessions otherwise transfers via wheelchair. Seen by PMR (8/2) via telemed visit and currently on Tylenol, Norco, tizanidine, lidocaine patch and gabapentin. Has f/u visit 8/26 with OV note mentioning trial of Botox.  Continues to experience left foot numbness and pain worse at night -gabapentin helps some.  Denies new stroke/TIA symptoms.  Remains on Keppra 500 mg twice daily tolerating without seizure activity.  Remains on topiramate for headache prophylaxis.  Compliant on atorvastatin 40 mg daily -denies side effects.  Blood pressure today 142/81 -routinely monitors at home and has been stable. Per wife, plans on being seen by urology for voiding difficulties post stroke and current use of tamsulosin.  No further concerns at this time.    ROS:   14 system review of systems performed and negative with exception of those listed in HPI  PMH:  Past Medical History:  Diagnosis Date   Chronic combined systolic and diastolic CHF (congestive heart failure) (HCC)    EF 98-33%   Diastolic dysfunction    Dyslipidemia    Hyperlipidemia    Hypertension    Nonischemic  dilated cardiomyopathy ([place])    felt secondary to HTN with no ischemia on nuclear stress test 05/2012, EF 25-30%   Shortness of breath     PSH:  Past Surgical History:  Procedure Laterality Date   KNEE ARTHROSCOPY Left 04/13/2016   Procedure: ARTHROSCOPY KNEE WITH DEBRIDEMENT;  Surgeon: Garald Balding, MD;  Location: [facility];   Service: Orthopedics;  Laterality: Left;   KNEE ARTHROSCOPY WITH MEDIAL MENISECTOMY Right 04/13/2016   Procedure: KNEE ARTHROSCOPY WITH MEDIAL MENISECTOMY;  Surgeon: Garald Balding, MD;  Location: [facility];  Service: Orthopedics;  Laterality: Right;   NASAL FRACTURE SURGERY      Social History:  Social History   Socioeconomic History   Marital status: Married    Spouse name: Not on file   Number of children: Not on file   Years of education: Not on file   Highest education level: Not on file  Occupational History   Not on file  Tobacco Use   Smoking status: Never   Smokeless tobacco: Never  Vaping Use   Vaping Use: Never used  Substance and Sexual Activity   Alcohol use: Yes    Alcohol/week: 24.0 standard drinks    Types: 24 Cans of beer per week    Comment: 2 beers per day   Drug use: No   Sexual activity: Yes  Other Topics Concern   Not on file  Social History Narrative   Not on file   Social Determinants of Health   Financial Resource Strain: Not on file  Food Insecurity: Not on file  Transportation Needs: Not on file  Physical Activity: Not on file  Stress: Not on file  Social Connections: Not on file  Intimate Partner Violence: Not on file    Family History:  Family History  Problem Relation Age of Onset   Hypertension Mother        Passed away when she is 72 secondary to hip surgery complications   CAD Mother    Heart attack Mother    Hypertension Father    Heart attack Maternal Grandmother     Medications:   Current Outpatient Medications on File Prior to Visit  Medication Sig Dispense Refill   acetaminophen (TYLENOL) 325 MG tablet Take 1-2 tablets (325-650 mg total) by mouth every 4 (four) hours as needed for mild pain.     amLODipine (NORVASC) 10 MG tablet Take 1 tablet (10 mg total) by mouth daily. 90 tablet 0   atorvastatin (LIPITOR) 40 MG tablet Take 1 tablet (40 mg total) by mouth daily at 6 PM. 120 tablet 0   Blood  Pressure KIT 1 application by Does not apply route 2 (two) times daily. 1 kit 0   cloNIDine (CATAPRES) 0.1 MG tablet Take 1 tablet (0.1 mg total) by mouth 3 (three) times daily. 90 tablet 0   diclofenac Sodium (VOLTAREN) 1 % GEL Apply 2 g topically 4 (four) times daily. 937 g 0   folic acid (FOLVITE) 1 MG tablet Take 1 tablet (1 mg total) by mouth daily. 30 tablet 0   gabapentin (NEURONTIN) 300 MG capsule Take 1 capsule (300 mg total) by mouth at bedtime. 30 capsule 1   guaiFENesin (MUCINEX) 600 MG 12 hr tablet Take 1 tablet (600 mg total) by mouth 2 (two) times daily as needed for to loosen phlegm. 60 tablet 3   hydrocerin (EUCERIN) CREA Apply 1 application topically 2 (two) times daily. 228 g 0   HYDROcodone-acetaminophen (NORCO/VICODIN)  5-325 MG tablet Take 1 tablet by mouth every 6 (six) hours as needed for severe pain. 20 tablet 0   labetalol (NORMODYNE) 100 MG tablet Take 1 tablet (100 mg total) by mouth 2 (two) times daily. 180 tablet 0   levETIRAcetam (KEPPRA) 500 MG tablet Take 1 tablet (500 mg total) by mouth 2 (two) times daily. 180 tablet 0   lidocaine (LIDODERM) 5 % Place 2 patches onto the skin daily. To back at 8 am and remove at 8 pm daily. Has to be off for 12 hours. Purchase over the counter if insurance does not cover this. 60 patch 0   losartan (COZAAR) 50 MG tablet Take 1 tablet (50 mg total) by mouth 2 (two) times daily. 180 tablet 0   Multiple Vitamins-Minerals (CERTAVITE/ANTIOXIDANTS) TABS Take 1 tablet by mouth daily. 30 tablet 0   naphazoline-pheniramine (NAPHCON-A) 0.025-0.3 % ophthalmic solution Place 1 drop into the right eye 4 (four) times daily as needed for eye irritation. 15 mL 0   pantoprazole (PROTONIX) 40 MG tablet Take 1 tablet (40 mg total) by mouth daily. 30 tablet 0   polyvinyl alcohol (LIQUIFILM TEARS) 1.4 % ophthalmic solution Place 1 drop into both eyes every 6 (six) hours as needed for dry eyes. 15 mL 0   QUEtiapine (SEROQUEL) 25 MG tablet Take 1 tablet (25  mg total) by mouth at bedtime. 30 tablet 0   senna-docusate (SENOKOT-S) 8.6-50 MG tablet Take 2 tablets by mouth daily with supper. 60 tablet 0   tamsulosin (FLOMAX) 0.4 MG CAPS capsule Take 1 capsule (0.4 mg total) by mouth daily after supper. 90 capsule 0   thiamine 100 MG tablet Take 1 tablet (100 mg total) by mouth daily. 30 tablet 0   tiZANidine (ZANAFLEX) 2 MG tablet Take 1 tablet (2 mg total) by mouth 3 (three) times daily. 90 tablet 2   topiramate (TOPAMAX) 25 MG tablet Take 2 tablets (50 mg total) by mouth daily. 180 tablet 0   No current facility-administered medications on file prior to visit.    Allergies:  No Known Allergies    OBJECTIVE:  Physical Exam  Vitals:   09/01/20 1401  BP: (!) 142/81  Pulse: 77  Weight: 230 lb (104.3 kg)  Height: '5\' 10"'  (1.778 m)   Body mass index is 33 kg/m. No results found.   General: well developed, well nourished, very pleasant middle-aged African-American male, seated, in no evident distress Head: head normocephalic and atraumatic.   Neck: supple with no carotid or supraclavicular bruits Cardiovascular: regular rate and rhythm, no murmurs Musculoskeletal: no deformity Skin:  no rash/petichiae Vascular:  Normal pulses all extremities   Neurologic Exam Mental Status: Awake and fully alert.  Fluent speech and language.  Oriented to place and time. Recent memory mildly impaired and remote memory intact. Attention span, concentration and fund of knowledge appropriate during visit. Mood and affect appropriate.  Cranial Nerves: Fundoscopic exam reveals sharp disc margins. Pupils equal, briskly reactive to light. Extraocular movements full without nystagmus. Visual fields full to confrontation. Hearing intact.  Mild left lower facial weakness.  Tongue, palate moves normally and symmetrically.  Motor: Normal strength, bulk and tone right upper and lower extremity LUE: 2/5 deltoid, 3/5 elbow flexion and extension and handgrip with  increased tone throughout and mild swelling LLE: 4/5 hip flexor and KE, 3/5 KF, 2/5 ADF and APF; mild swelling distally Sensory.:  Lack of sensation left hemibody Coordination: Rapid alternating movements normal in all extremities on right side. Finger-to-nose  and heel-to-shin performed accurately on right side. Gait and Station: Deferred Reflexes: Brisk on left side, 1+ right side.  Toes downgoing.     NIHSS  7 Modified Rankin  3-4      ASSESSMENT: [name] is a 55 y.o. year old male with right thalamic ICH with IVH on 06/09/2020 like secondary to uncontrolled HTN with seizure activity on 5/27. Vascular risk factors include HTN, HLD, cardiomyopathy, CHF, and etoh use .      PLAN:  R thalamic ICH with IVH :  Residual deficit:  left spastic hemiparesis with sensory loss and pain, intermittent left peripheral blurred vision, and short term memory loss.  Continue therapies -will likely need to transition to outpatient therapies once home health completed.   Increase gabapentin from 300 mg nightly to 300 mg twice daily - if difficulty tolerating daytime dose, can take 600 mg at night.   Continue to follow with PMR as scheduled on 8/26 for further pain management Continue atorvastatin 40 mg daily for secondary stroke prevention.   Discussed secondary stroke prevention measures and importance of close PCP follow up for aggressive stroke risk factor management. I have gone over the pathophysiology of stroke, warning signs and symptoms, risk factors and their management in some detail with instructions to go to the closest emergency room for symptoms of concern. Seizure, post stroke: continue keppra 534m twice daily for seizure prevention - refill provided HTN: BP goal <130/90.  Stable on current regimen per PCP HLD: LDL goal <70. Recent LDL 78 on atorvastatin 40 mg daily.      Follow up in 3 months or call earlier if needed   CC:  [facility] provider: Dr. SLeonie ManPCP: SCamillia Herter NP    I spent 56 minutes of face-to-face and non-face-to-face time with patient and wife.  This included previsit chart review including review of recent hospitalization, lab review, study review, order entry, electronic health record documentation, patient and wife education and discussion regarding recent stroke and etiology, residual deficits and typical recovery time, post stroke seizures and ongoing use of Keppra, secondary stroke prevention measures and importance of managing stroke risk factors and answered all other questions to patient and wife's satisfaction   JFrann Rider AGNP-BC  [facility] [address]SPulaskiGVenice Pelzer [address] Phone [phone] [phone]: This document was prepared with digital dictation and possible smart phrase technology. Any transcriptional errors that result from this process are unintentional.

## 2020-09-01 NOTE — Patient Instructions (Addendum)
Increase gabapentin to 300mg  twice daily - ongoing monitoring with Dr. - if difficulty tolerating day dose, you can take both capsules at bedtime   Continue working with home health therapies for likely ongoing recovery  Continue atorvastatin 40mg  daily for stroke prevention   Continue to follow up with PCP regarding cholesterol and blood pressure management  Maintain strict control of hypertension with blood pressure goal below 130/90 and cholesterol with LDL cholesterol (bad cholesterol) goal below 70 mg/dL.   Continue keppra 500mg  twice daily for seizure prevention      Followup in the future with me in 3 months or call earlier if needed      Thank you for coming to see [name] at [facility]. I hope we have been able to provide you high quality care today.  You may receive a patient satisfaction survey over the next few weeks. We would appreciate your feedback and comments so that we may continue to improve ourselves and the health of our patients.

## 2020-09-03 ENCOUNTER — Other Ambulatory Visit: Payer: Self-pay

## 2020-09-03 ENCOUNTER — Encounter: Payer: Self-pay | Admitting: Physical Medicine and Rehabilitation

## 2020-09-03 ENCOUNTER — Encounter: Payer: 59 | Admitting: Physical Medicine and Rehabilitation

## 2020-09-03 VITALS — BP 136/72 | HR 71 | Temp 98.7°F | Ht 70.0 in | Wt 228.0 lb

## 2020-09-03 DIAGNOSIS — Z5181 Encounter for therapeutic drug level monitoring: Secondary | ICD-10-CM | POA: Diagnosis present

## 2020-09-03 DIAGNOSIS — G9059 Complex regional pain syndrome I of other specified site: Secondary | ICD-10-CM | POA: Diagnosis present

## 2020-09-03 DIAGNOSIS — Z79891 Long term (current) use of opiate analgesic: Secondary | ICD-10-CM | POA: Diagnosis present

## 2020-09-03 DIAGNOSIS — G8194 Hemiplegia, unspecified affecting left nondominant side: Secondary | ICD-10-CM | POA: Diagnosis present

## 2020-09-03 DIAGNOSIS — M545 Low back pain, unspecified: Secondary | ICD-10-CM | POA: Diagnosis present

## 2020-09-03 DIAGNOSIS — I610 Nontraumatic intracerebral hemorrhage in hemisphere, subcortical: Secondary | ICD-10-CM | POA: Diagnosis present

## 2020-09-03 DIAGNOSIS — G894 Chronic pain syndrome: Secondary | ICD-10-CM | POA: Diagnosis present

## 2020-09-03 DIAGNOSIS — M79672 Pain in left foot: Secondary | ICD-10-CM | POA: Diagnosis present

## 2020-09-03 DIAGNOSIS — M25512 Pain in left shoulder: Secondary | ICD-10-CM | POA: Diagnosis present

## 2020-09-03 DIAGNOSIS — L309 Dermatitis, unspecified: Secondary | ICD-10-CM | POA: Diagnosis present

## 2020-09-03 MED ORDER — GABAPENTIN 300 MG PO CAPS: 300.0000 mg | ORAL_CAPSULE | Freq: Three times a day (TID) | ORAL | 3 refills | Status: DC

## 2020-09-03 NOTE — Progress Notes (Signed)
Injection of trigger point of left shoulder  Indication: Left shoulder pain not relieved by medication management and other conservative care.  Informed consent was obtained after describing risks and benefits of the procedure with the patient, this includes bleeding, bruising, infection and medication side effects. The patient wishes to proceed and has given written consent. Patient was placed in a seated position. The left shoulder was marked and prepped with betadine in the subacromial area. A 25-gauge 1-1/2 inch needle was inserted into the subacromial area. After negative draw back for blood, a solution containing 4 mL of 1% lidocaine was injected into the trigger point. A band aid was applied. The patient tolerated the procedure well. Post procedure instructions were given.

## 2020-09-06 ENCOUNTER — Encounter (HOSPITAL_BASED_OUTPATIENT_CLINIC_OR_DEPARTMENT_OTHER): Payer: 59 | Admitting: Physical Medicine and Rehabilitation

## 2020-09-06 ENCOUNTER — Encounter: Payer: Self-pay | Admitting: Physical Medicine and Rehabilitation

## 2020-09-06 ENCOUNTER — Other Ambulatory Visit: Payer: Self-pay

## 2020-09-06 DIAGNOSIS — G9059 Complex regional pain syndrome I of other specified site: Secondary | ICD-10-CM

## 2020-09-06 DIAGNOSIS — M79672 Pain in left foot: Secondary | ICD-10-CM

## 2020-09-06 NOTE — Progress Notes (Signed)
Subjective:    Patient ID: [name], male    DOB: 26-Jan-1965, 55 y.o.   MRN: [id number]  HPI An audio/video tele-health visit is felt to be the most appropriate encounter for this patient at this time. This is a follow up tele-visit via phone. The patient is at home. MD is at office.    [name] presents for follow-up of left sided hemiplegia, right sided lower back pain, and left sided foot pain following CVA -pain has been severe at home, as it had been in hospital -his wife purchased 500mg  tablets of tylenol -he has a one week supply of Norco -the benefits from the trigger point injections last week have worn off -pain is severely limiting his function -he is taking Tizanidine and it does not help, does make him sleepy, but his wife is trying to encourage him to stay up during the day -he developed the foot pain a couple of weeks ago -he was wearing a boot at that time so took it off but has still had pain a couple of weeks afterward. -he has not tried anything for this pain -it is worst at night and feels like burning and tingling at his heel. -he has been doing great with therapy and is walking in his home! -his wife asks if he could get a handicap placard.  -the foot is still feeling numb with pins and needles.  -he is working with therapy right now.  -he was feeling wobbly over the weekend so had to miss his PT session -shoulder is feeling very numb and cannot make a fist  Family History  Problem Relation Age of Onset   Hypertension Mother        Passed away when she is 57 secondary to hip surgery complications   CAD Mother    Heart attack Mother    Hypertension Father    Heart attack Maternal Grandmother    Social History   Socioeconomic History   Marital status: Married    Spouse name: Not on file   Number of children: Not on file   Years of education: Not on file   Highest education level: Not on file  Occupational History   Not on file  Tobacco Use    Smoking status: Never   Smokeless tobacco: Never  Vaping Use   Vaping Use: Never used  Substance and Sexual Activity   Alcohol use: Yes    Alcohol/week: 24.0 standard drinks    Types: 24 Cans of beer per week    Comment: 2 beers per day   Drug use: No   Sexual activity: Yes  Other Topics Concern   Not on file  Social History Narrative   Not on file   Social Determinants of Health   Financial Resource Strain: Not on file  Food Insecurity: Not on file  Transportation Needs: Not on file  Physical Activity: Not on file  Stress: Not on file  Social Connections: Not on file   Past Surgical History:  Procedure Laterality Date   KNEE ARTHROSCOPY Left 04/13/2016   Procedure: ARTHROSCOPY KNEE WITH DEBRIDEMENT;  Surgeon: 04/15/2016, MD;  Location: [facility];  Service: Orthopedics;  Laterality: Left;   KNEE ARTHROSCOPY WITH MEDIAL MENISECTOMY Right 04/13/2016   Procedure: KNEE ARTHROSCOPY WITH MEDIAL MENISECTOMY;  Surgeon: 04/15/2016, MD;  Location: [facility];  Service: Orthopedics;  Laterality: Right;   NASAL FRACTURE SURGERY     Past Medical History:  Diagnosis Date   Chronic combined systolic and diastolic CHF (congestive heart failure) (HCC)    EF 25-30%   Diastolic dysfunction    Dyslipidemia    Hyperlipidemia    Hypertension    Nonischemic dilated cardiomyopathy (HCC)    felt secondary to HTN with no ischemia on nuclear stress test 05/2012, EF 25-30%   Shortness of breath    There were no vitals taken for this visit.  Opioid Risk Score:   Fall Risk Score:  `1  Depression screen PHQ 2/9  Depression screen PHQ 2/9 07/30/2020  Decreased Interest 0  Down, Depressed, Hopeless 0  PHQ - 2 Score 0  Altered sleeping 0  Tired, decreased energy 0  Change in appetite 0  Feeling bad or failure about yourself  0  Trouble concentrating 0  Moving slowly or fidgety/restless 0  Suicidal thoughts 0  PHQ-9 Score 0  Difficult doing  work/chores Not difficult at all    Review of Systems     Objective:   Physical Exam Not performed as patient was seen via phone today     Assessment & Plan:  [name] is a 55 year old man presenting today for f/u of left hemiplegia, right sided low back pain, and left sided foot pain following stroke.  1) Right sided low back pain: -discussed with patient and wife that Xrs show spinal degeneration -pain appears to be more myofascial -trigger point injections with lidocaine provided <1 week of benefit -Would like to try Botox injections for longer relief as this pain is greatly inhibiting patient's function and quality of life -continue heat and lidocaine patch and home physical and occupational therapy -Can take tylenol 500mg  three times per day -minimize use of Norco as much as possible as not benefiting and is a high risk opioid -can continue tizandine as needed.   2) left sided foot pain -prescribed gabapentin 300mg  HS, increased dose to TID. Felt the pain was worse after getting Gabapentin.  -recommended icing 15 minutes three times per day -can keep compression garment and boot off since these were worsening his pain -Discussed Qutenza as an option for neuropathic pain control. Discussed that this is a capsaicin patch, stronger than capsaicin cream. Discussed that it is currently approved for diabetic peripheral neuropathy and post-herpetic neuralgia, but that it has also shown benefit in treating other forms of neuropathy. Provided patient with link to site to learn more about the patch: . Discussed that the patch would be placed in office and benefits usually last 3 months. Discussed that unintended exposure to capsaicin can cause severe irritation of eyes, mucous membranes, respiratory tract, and skin, but that Qutenza is a local treatment and does not have the systemic side effects of other nerve medications. Discussed that there may be pain, itching,  erythema, and decreased sensory function associated with the application of Qutenza. Side effects usually subside within 1 week. A cold pack of analgesic medications can help with these side effects. Blood pressure can also be increased due to pain associated with administration of the patch.   3) CVA -provided with a handicap placard.   4) Left sided arm pain: -has felt numbness and weakness in his upper extremity.   15 minutes spent in discussing his numbness and tingling in upper and lower extremities, plan for vascular ultrasound to check for clot, discussion of response to gabapentin, performance of therapy this weekend, plan to trial Qutenza next week.

## 2020-09-08 ENCOUNTER — Encounter (HOSPITAL_COMMUNITY): Payer: Self-pay

## 2020-09-08 ENCOUNTER — Telehealth: Payer: Self-pay | Admitting: *Deleted

## 2020-09-08 DIAGNOSIS — I69391 Dysphagia following cerebral infarction: Secondary | ICD-10-CM

## 2020-09-08 DIAGNOSIS — I610 Nontraumatic intracerebral hemorrhage in hemisphere, subcortical: Secondary | ICD-10-CM

## 2020-09-08 DIAGNOSIS — G8194 Hemiplegia, unspecified affecting left nondominant side: Secondary | ICD-10-CM

## 2020-09-08 NOTE — Telephone Encounter (Signed)
[name] [facility] called to report that [name] is ready to transition to outpt [name]/OT/ST.  He would like to go to [facility]hab [facility].  Ph # [phone], fx # [phone].  Referrals placed.

## 2020-09-13 ENCOUNTER — Ambulatory Visit (HOSPITAL_COMMUNITY): Payer: 59 | Attending: Physical Medicine and Rehabilitation

## 2020-09-13 ENCOUNTER — Other Ambulatory Visit: Payer: Self-pay | Admitting: Family

## 2020-09-13 DIAGNOSIS — I69319 Unspecified symptoms and signs involving cognitive functions following cerebral infarction: Secondary | ICD-10-CM

## 2020-09-13 DIAGNOSIS — Z0289 Encounter for other administrative examinations: Secondary | ICD-10-CM

## 2020-09-13 DIAGNOSIS — M545 Low back pain, unspecified: Secondary | ICD-10-CM

## 2020-09-13 DIAGNOSIS — I10 Essential (primary) hypertension: Secondary | ICD-10-CM

## 2020-09-14 ENCOUNTER — Other Ambulatory Visit: Payer: Self-pay

## 2020-09-14 ENCOUNTER — Encounter (HOSPITAL_BASED_OUTPATIENT_CLINIC_OR_DEPARTMENT_OTHER): Payer: 59 | Admitting: Physical Medicine and Rehabilitation

## 2020-09-14 ENCOUNTER — Telehealth: Payer: Self-pay | Admitting: *Deleted

## 2020-09-14 VITALS — BP 147/79 | HR 65 | Temp 98.6°F | Ht 70.0 in | Wt 228.0 lb

## 2020-09-14 DIAGNOSIS — M545 Low back pain, unspecified: Secondary | ICD-10-CM | POA: Diagnosis not present

## 2020-09-14 DIAGNOSIS — Z5181 Encounter for therapeutic drug level monitoring: Secondary | ICD-10-CM

## 2020-09-14 DIAGNOSIS — G894 Chronic pain syndrome: Secondary | ICD-10-CM

## 2020-09-14 DIAGNOSIS — Z79891 Long term (current) use of opiate analgesic: Secondary | ICD-10-CM | POA: Diagnosis not present

## 2020-09-14 DIAGNOSIS — L309 Dermatitis, unspecified: Secondary | ICD-10-CM

## 2020-09-14 DIAGNOSIS — G8194 Hemiplegia, unspecified affecting left nondominant side: Secondary | ICD-10-CM

## 2020-09-14 DIAGNOSIS — M79672 Pain in left foot: Secondary | ICD-10-CM | POA: Diagnosis not present

## 2020-09-14 MED ORDER — TIZANIDINE HCL 2 MG PO TABS: 2.0000 mg | ORAL_TABLET | Freq: Three times a day (TID) | ORAL | 2 refills | Status: DC

## 2020-09-14 MED ORDER — HYDROCERIN EX CREA: 1.0000 "application " | TOPICAL_CREAM | Freq: Two times a day (BID) | CUTANEOUS | 0 refills | Status: DC

## 2020-09-14 MED ORDER — HYDROCODONE-ACETAMINOPHEN 5-325 MG PO TABS: 1.0000 | ORAL_TABLET | Freq: Every evening | ORAL | 0 refills | Status: DC | PRN

## 2020-09-14 MED ORDER — DICLOFENAC SODIUM 1 % EX GEL: 2.0000 g | Freq: Four times a day (QID) | CUTANEOUS | 0 refills | Status: DC

## 2020-09-14 MED ORDER — LIDOCAINE 5 % EX PTCH: 2.0000 | MEDICATED_PATCH | CUTANEOUS | 0 refills | Status: DC

## 2020-09-14 NOTE — Telephone Encounter (Signed)
[name]  Key: [id number] PA Case ID: [id number]   Hydrocodone-acetaminophen 5/325mg   Approved

## 2020-09-14 NOTE — Progress Notes (Signed)
Subjective:    Patient ID: [name], male    DOB: 05-Jan-1966, 55 y.o.   MRN: [id number]  HPI Mr. Boyte presents for follow-up of left sided hemiplegia, right sided lower back pain, and left sided foot pain following CVA -pain has been severe at home, as it had been in hospital -his wife purchased 500mg  tablets of tylenol -he has a one week supply of Norco -the benefits from the trigger point injections last week have worn off -pain is severely limiting his function -he is taking Tizanidine and it does not help, does make him sleepy, but his wife is trying to encourage him to stay up during the day -he developed the foot pain a couple of weeks ago -he was wearing a boot at that time so took it off but has still had pain a couple of weeks afterward. -he has not tried anything for this pain -it is worst at night and feels like burning and tingling at his heel. -he has been doing great with therapy and is walking in his home! -his wife asks if he could get a handicap placard.  -the foot is still feeling numb with pins and needles, also painful. He is unsure whether Gabapentin helps -Has been able to use bathroom himself! -he is working with therapy right now.  -he was feeling wobbly over the weekend so had to miss his PT session -shoulder is feeling very numb and cannot make a fist -ready to proceed with Qutenza today -His wife asks for refill of Eucerin and Diclofenac and Tizanidine.  -She asks whether he should still be taking Seroquel  Family History  Problem Relation Age of Onset   Hypertension Mother        Passed away when she is 12 secondary to hip surgery complications   CAD Mother    Heart attack Mother    Hypertension Father    Heart attack Maternal Grandmother    Social History   Socioeconomic History   Marital status: Married    Spouse name: Not on file   Number of children: Not on file   Years of education: Not on file   Highest education level: Not on file   Occupational History   Not on file  Tobacco Use   Smoking status: Never   Smokeless tobacco: Never  Vaping Use   Vaping Use: Never used  Substance and Sexual Activity   Alcohol use: Yes    Alcohol/week: 24.0 standard drinks    Types: 24 Cans of beer per week    Comment: 2 beers per day   Drug use: No   Sexual activity: Yes  Other Topics Concern   Not on file  Social History Narrative   Not on file   Social Determinants of Health   Financial Resource Strain: Not on file  Food Insecurity: Not on file  Transportation Needs: Not on file  Physical Activity: Not on file  Stress: Not on file  Social Connections: Not on file   Past Surgical History:  Procedure Laterality Date   KNEE ARTHROSCOPY Left 04/13/2016   Procedure: ARTHROSCOPY KNEE WITH DEBRIDEMENT;  Surgeon: 04/15/2016, MD;  Location: [facility];  Service: Orthopedics;  Laterality: Left;   KNEE ARTHROSCOPY WITH MEDIAL MENISECTOMY Right 04/13/2016   Procedure: KNEE ARTHROSCOPY WITH MEDIAL MENISECTOMY;  Surgeon: 04/15/2016, MD;  Location: [facility];  Service: Orthopedics;  Laterality: Right;   NASAL FRACTURE SURGERY     Past  Medical History:  Diagnosis Date   Chronic combined systolic and diastolic CHF (congestive heart failure) (HCC)    EF 25-30%   Diastolic dysfunction    Dyslipidemia    Hyperlipidemia    Hypertension    Nonischemic dilated cardiomyopathy (HCC)    felt secondary to HTN with no ischemia on nuclear stress test 05/2012, EF 25-30%   Shortness of breath    BP (!) 147/79   Pulse 65   Temp 98.6 F (37 C)   Ht 5\' 10"  (1.778 m) Comment: reported  Wt 228 lb (103.4 kg) Comment: last recorded  SpO2 96%   BMI 32.71 kg/m   Opioid Risk Score:   Fall Risk Score:  `1  Depression screen PHQ 2/9  Depression screen [facility] 2/9 09/14/2020 07/30/2020  Decreased Interest 0 0  Down, Depressed, Hopeless 0 0  PHQ - 2 Score 0 0  Altered sleeping - 0  Tired, decreased  energy - 0  Change in appetite - 0  Feeling bad or failure about yourself  - 0  Trouble concentrating - 0  Moving slowly or fidgety/restless - 0  Suicidal thoughts - 0  PHQ-9 Score - 0  Difficult doing work/chores - Not difficult at all    Review of Systems     Objective:   Physical Exam Gen: no distress, normal appearing HEENT: oral mucosa pink and moist, NCAT Cardio: Reg rate Chest: normal effort, normal rate of breathing Abd: soft, non-distended Ext: no edema Psych: pleasant, normal affect Skin: intact Neuro/MSK: Alert and oriented x3. Weakness of LUE and LLE. Decreased sensation along heel and plantar aspect of left foot, tenderness to palpation of left shoulder     Assessment & Plan:  Mr. Landin is a 55 year old man presenting today for f/u of left hemiplegia, right sided low back pain, and left sided foot pain following stroke.  1) Right sided low back pain: -discussed with patient and wife that Xrs show spinal degeneration -pain appears to be more myofascial -trigger point injections with lidocaine provided <1 week of benefit -Would like to try Botox injections for longer relief as this pain is greatly inhibiting patient's function and quality of life -continue heat and lidocaine patch and home physical and occupational therapy -Can take tylenol 500mg  three times per day -minimize use of Norco as much as possible as not benefiting and is a high risk opioid -can continue tizandine as needed.   2) left sided foot pain -prescribed gabapentin 300mg  HS, increased dose to TID. Felt the pain was worse after getting Gabapentin.  -recommended icing 15 minutes three times per day -can keep compression garment and boot off since these were worsening his pain -Discussed Qutenza as an option for neuropathic pain control. Discussed that this is a capsaicin patch, stronger than capsaicin cream. Discussed that it is currently approved for diabetic peripheral neuropathy and  post-herpetic neuralgia, but that it has also shown benefit in treating other forms of neuropathy. Provided patient with link to site to learn more about the patch: 59. Discussed that the patch would be placed in office and benefits usually last 3 months. Discussed that unintended exposure to capsaicin can cause severe irritation of eyes, mucous membranes, respiratory tract, and skin, but that Qutenza is a local treatment and does not have the systemic side effects of other nerve medications. Discussed that there may be pain, itching, erythema, and decreased sensory function associated with the application of Qutenza. Side effects usually subside within 1 week. A cold  pack of analgesic medications can help with these side effects. Blood pressure can also be increased due to pain associated with administration of the patch.   4 patches of Qutenza was applied to the areas of pain. Ice packs were applied during the procedure to ensure patient comfort. Blood pressure was monitored every 15 minutes. The patient tolerated the procedure well. Post-procedure instructions were given and follow-up has been scheduled.  NDC 50518-335-82  3) CVA -provided with a handicap placard.   4) Left sided arm pain: -has felt numbness and weakness in his upper extremity.  -refilled tizanidine, lidocaine patch, and diclofenac gel  5) Agitation: resolved, may discontinue seroquel.

## 2020-09-15 ENCOUNTER — Telehealth: Payer: Self-pay | Admitting: Family

## 2020-09-15 MED ORDER — CLONIDINE HCL 0.1 MG PO TABS: 0.1000 mg | ORAL_TABLET | Freq: Three times a day (TID) | ORAL | 11 refills | Status: DC

## 2020-09-15 MED ORDER — FOLIC ACID 1 MG PO TABS: 1.0000 mg | ORAL_TABLET | Freq: Every day | ORAL | 3 refills | Status: DC

## 2020-09-15 NOTE — Addendum Note (Signed)
Addended by: Horton Chin on: 09/15/2020 04:31 PM   Modules accepted: Orders

## 2020-09-15 NOTE — Telephone Encounter (Signed)
Pt wife come in states its urgent he has still not got a refill for his meds please assist

## 2020-09-16 ENCOUNTER — Ambulatory Visit (HOSPITAL_COMMUNITY)
Admission: RE | Admit: 2020-09-16 | Discharge: 2020-09-16 | Disposition: A | Discharge status: Home / Self Care | Payer: 59 | Source: Ambulatory Visit | Attending: Physical Medicine and Rehabilitation | Admitting: Physical Medicine and Rehabilitation

## 2020-09-16 ENCOUNTER — Other Ambulatory Visit: Payer: Self-pay

## 2020-09-16 ENCOUNTER — Ambulatory Visit (HOSPITAL_COMMUNITY): Payer: 59 | Attending: Physical Medicine and Rehabilitation

## 2020-09-16 DIAGNOSIS — M79672 Pain in left foot: Secondary | ICD-10-CM | POA: Insufficient documentation

## 2020-09-16 NOTE — Progress Notes (Signed)
LLE venous duplex has been completed.  Results can be found under chart review under CV PROC. 09/16/2020 2:56 PM Keshia Weare RVT, RDMS

## 2020-09-16 NOTE — Telephone Encounter (Signed)
Refills were completed on 09/14/20

## 2020-09-17 ENCOUNTER — Encounter: Payer: 59 | Admitting: Physical Medicine and Rehabilitation

## 2020-09-20 ENCOUNTER — Encounter: Payer: 59 | Admitting: Family

## 2020-09-20 NOTE — Progress Notes (Signed)
Patient ID: [name], male    DOB: 07/04/1965  MRN: [id number]  CC: Annual Physical Exam   Subjective: [name] is a 55 y.o. male who presents for annual physical exam. He is accompanied by his wife Kimberlee Nearing.  His concerns today include: none.  Patient Active Problem List   Diagnosis Date Noted   Left hemiplegia ([place]) 07/02/2020   Cognitive deficit due to recent stroke 07/02/2020   Dysphagia, post-stroke    Vascular headache    Acute blood loss anemia    AKI (acute kidney injury) ([place])    Essential hypertension    Seizures (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   ICH (intracerebral hemorrhage) ([place]) 06/09/2020   Sprain of anterior talofibular ligament of left ankle 01/15/2020   Hyperlipidemia, on Lipitor 04/01/2017   Alcohol use disorder, mild, abuse 06/30/2016   Morbid obesity ([place]) 06/30/2016   Nonischemic dilated cardiomyopathy ([place]) 11/08/2012   Chronic combined systolic and diastolic CHF (congestive heart failure) ([place]) 11/08/2012   HTN (hypertension) 05/25/2012     Current Outpatient Medications on File Prior to Visit  Medication Sig Dispense Refill   amLODipine (NORVASC) 10 MG tablet Take 1 tablet (10 mg total) by mouth daily. 90 tablet 0   atorvastatin (LIPITOR) 40 MG tablet Take 1 tablet (40 mg total) by mouth daily at 6 PM. 120 tablet 0   Blood Pressure KIT 1 application by Does not apply route 2 (two) times daily. 1 kit 0   cloNIDine (CATAPRES) 0.1 MG tablet Take 1 tablet (0.1 mg total) by mouth 3 (three) times daily. 90 tablet 11   diclofenac Sodium (VOLTAREN) 1 % GEL Apply 2 g topically 4 (four) times daily. 539 g 0   folic acid (FOLVITE) 1 MG tablet Take 1 tablet (1 mg total) by mouth daily. 90 tablet 3   gabapentin (NEURONTIN) 100 MG capsule Take 200 mg by mouth 3 (three) times daily.     gabapentin (NEURONTIN) 300 MG capsule Take 1 capsule (300 mg total) by mouth 3 (three) times daily. 90 capsule 3   hydrocerin (EUCERIN) CREA Apply 1  application topically 2 (two) times daily. 228 g 0   HYDROcodone-acetaminophen (NORCO/VICODIN) 5-325 MG tablet Take 1 tablet by mouth at bedtime as needed for severe pain. 30 tablet 0   labetalol (NORMODYNE) 100 MG tablet Take 1 tablet (100 mg total) by mouth 2 (two) times daily. 180 tablet 0   levETIRAcetam (KEPPRA) 500 MG tablet Take 1 tablet (500 mg total) by mouth 2 (two) times daily. 180 tablet 3   lidocaine (LIDODERM) 5 % Place 2 patches onto the skin daily. To back at 8 am and remove at 8 pm daily. Has to be off for 12 hours. Purchase over the counter if insurance does not cover this. 60 patch 0   losartan (COZAAR) 50 MG tablet Take 1 tablet (50 mg total) by mouth 2 (two) times daily. 180 tablet 0   Multiple Vitamins-Minerals (CERTAVITE/ANTIOXIDANTS) TABS Take 1 tablet by mouth daily. 30 tablet 0   naphazoline-pheniramine (NAPHCON-A) 0.025-0.3 % ophthalmic solution Place 1 drop into the right eye 4 (four) times daily as needed for eye irritation. 15 mL 0   pantoprazole (PROTONIX) 40 MG tablet Take 1 tablet (40 mg total) by mouth daily. 30 tablet 0   polyvinyl alcohol (LIQUIFILM TEARS) 1.4 % ophthalmic solution Place 1 drop into both eyes every 6 (six) hours as needed for dry eyes. 15 mL 0   senna-docusate (SENOKOT-S)  8.6-50 MG tablet Take 2 tablets by mouth daily with supper. 60 tablet 0   tamsulosin (FLOMAX) 0.4 MG CAPS capsule Take 1 capsule (0.4 mg total) by mouth daily after supper. 90 capsule 0   thiamine 100 MG tablet Take 1 tablet (100 mg total) by mouth daily. 30 tablet 0   tiZANidine (ZANAFLEX) 2 MG tablet Take 1 tablet (2 mg total) by mouth 3 (three) times daily. 90 tablet 2   topiramate (TOPAMAX) 25 MG tablet Take 2 tablets (50 mg total) by mouth daily. 180 tablet 0   No current facility-administered medications on file prior to visit.    No Known Allergies  Social History   Socioeconomic History   Marital status: Married    Spouse name: Not on file   Number of children:  Not on file   Years of education: Not on file   Highest education level: Not on file  Occupational History   Not on file  Tobacco Use   Smoking status: Never   Smokeless tobacco: Never  Vaping Use   Vaping Use: Never used  Substance and Sexual Activity   Alcohol use: Yes    Alcohol/week: 24.0 standard drinks    Types: 24 Cans of beer per week    Comment: 2 beers per day   Drug use: No   Sexual activity: Yes  Other Topics Concern   Not on file  Social History Narrative   Not on file   Social Determinants of Health   Financial Resource Strain: Not on file  Food Insecurity: Not on file  Transportation Needs: Not on file  Physical Activity: Not on file  Stress: Not on file  Social Connections: Not on file  Intimate Partner Violence: Not on file    Family History  Problem Relation Age of Onset   Hypertension Mother        Passed away when she is 53 secondary to hip surgery complications   CAD Mother    Heart attack Mother    Hypertension Father    Heart attack Maternal Grandmother     Past Surgical History:  Procedure Laterality Date   KNEE ARTHROSCOPY Left 04/13/2016   Procedure: ARTHROSCOPY KNEE WITH DEBRIDEMENT;  Surgeon: Garald Balding, MD;  Location: [facility];  Service: Orthopedics;  Laterality: Left;   KNEE ARTHROSCOPY WITH MEDIAL MENISECTOMY Right 04/13/2016   Procedure: KNEE ARTHROSCOPY WITH MEDIAL MENISECTOMY;  Surgeon: Garald Balding, MD;  Location: [facility];  Service: Orthopedics;  Laterality: Right;   NASAL FRACTURE SURGERY      ROS: Review of Systems Negative except as stated above  PHYSICAL EXAM: Physical Exam HENT:     Head: Normocephalic and atraumatic.     Comments: Smile and frown symmetrical.    Right Ear: Tympanic membrane, ear canal and external ear normal.     Left Ear: Tympanic membrane, ear canal and external ear normal.     Nose: Nose normal.     Mouth/Throat:     Mouth: Mucous membranes are  moist.     Pharynx: Oropharynx is clear.  Eyes:     Extraocular Movements: Extraocular movements intact.     Conjunctiva/sclera: Conjunctivae normal.     Pupils: Pupils are equal, round, and reactive to light.  Cardiovascular:     Rate and Rhythm: Normal rate and regular rhythm.     Pulses: Normal pulses.     Heart sounds: Normal heart sounds.  Pulmonary:     Effort: Pulmonary  effort is normal.     Breath sounds: Normal breath sounds.  Abdominal:     General: Bowel sounds are normal.     Palpations: Abdomen is soft.  Genitourinary:    Comments: Patient declined exam.  Musculoskeletal:        General: Normal range of motion.     Cervical back: Normal range of motion and neck supple.     Comments: Right upper extremity and right lower extremity motor strength 4/5. Left upper extremity and left lower extremity motor strength 2/5. Patient present today in manual wheelchair.   Skin:    General: Skin is warm and dry.     Capillary Refill: Capillary refill takes less than 2 seconds.  Neurological:     General: No focal deficit present.     Mental Status: He is alert and oriented to person, place, and time.  Psychiatric:        Mood and Affect: Mood normal.        Behavior: Behavior normal.   ASSESSMENT AND PLAN: 1. Annual physical exam: - Counseled on healthy eating (including decreased daily intake of saturated fats, cholesterol, added sugars, sodium), STI prevention, and routine healthcare maintenance.  2. Screening for metabolic disorder: - PPI95+JOAC to check kidney function, liver function, and electrolyte balance.  - CMP14+EGFR  3. Screening for deficiency anemia: - CBC to screen for anemia. - CBC  4. Diabetes mellitus screening: - Hemoglobin A1c to screen for pre-diabetes/diabetes. - Hemoglobin A1c  5. Screening cholesterol level: - Lipid panel to screen for high cholesterol.  - Lipid panel  6. Thyroid disorder screen: - TSH to check thyroid function.  -  TSH  7. Need for hepatitis C screening test: - Hepatitis C antibody to screen for hepatitis C.  - Hepatitis C Antibody  8. Colon cancer screening: - Referral to Gastroenterology for colon cancer screening by colonoscopy. - Ambulatory referral to Gastroenterology  9. Prostate cancer screening: - Screening for prostate function.  - PSA  10. Encounter for completion of form with patient: - During today's visit completed FMLA paperwork for wife Kimberlee Nearing.    Patient was given the opportunity to ask questions.  Patient verbalized understanding of the plan and was able to repeat key elements of the plan. Patient was given clear instructions to go to Emergency Department or return to medical center if symptoms don't improve, worsen, or new problems develop.The patient verbalized understanding.   Orders Placed This Encounter  Procedures   Hepatitis C Antibody   CBC   Lipid panel   TSH   Hemoglobin A1c   CMP14+EGFR   PSA   Ambulatory referral to Gastroenterology    Follow-up with primary provider as scheduled.   Camillia Herter, NP

## 2020-09-21 ENCOUNTER — Other Ambulatory Visit: Payer: Self-pay

## 2020-09-22 ENCOUNTER — Other Ambulatory Visit: Payer: Self-pay | Admitting: Family

## 2020-09-22 ENCOUNTER — Other Ambulatory Visit: Payer: Self-pay

## 2020-09-22 ENCOUNTER — Ambulatory Visit (INDEPENDENT_AMBULATORY_CARE_PROVIDER_SITE_OTHER): Payer: 59 | Admitting: Family

## 2020-09-22 DIAGNOSIS — Z1211 Encounter for screening for malignant neoplasm of colon: Secondary | ICD-10-CM

## 2020-09-22 DIAGNOSIS — Z130 Encounter for screening for diseases of the blood and blood-forming organs and certain disorders involving the immune mechanism: Secondary | ICD-10-CM

## 2020-09-22 DIAGNOSIS — Z125 Encounter for screening for malignant neoplasm of prostate: Secondary | ICD-10-CM

## 2020-09-22 DIAGNOSIS — Z0289 Encounter for other administrative examinations: Secondary | ICD-10-CM

## 2020-09-22 DIAGNOSIS — Z131 Encounter for screening for diabetes mellitus: Secondary | ICD-10-CM

## 2020-09-22 DIAGNOSIS — Z1159 Encounter for screening for other viral diseases: Secondary | ICD-10-CM

## 2020-09-22 DIAGNOSIS — Z13220 Encounter for screening for lipoid disorders: Secondary | ICD-10-CM

## 2020-09-22 DIAGNOSIS — Z1329 Encounter for screening for other suspected endocrine disorder: Secondary | ICD-10-CM

## 2020-09-22 DIAGNOSIS — Z0000 Encounter for general adult medical examination without abnormal findings: Secondary | ICD-10-CM | POA: Diagnosis not present

## 2020-09-22 DIAGNOSIS — Z13228 Encounter for screening for other metabolic disorders: Secondary | ICD-10-CM

## 2020-09-22 DIAGNOSIS — R21 Rash and other nonspecific skin eruption: Secondary | ICD-10-CM

## 2020-09-22 MED ORDER — TRIAMCINOLONE ACETONIDE 0.1 % EX CREA
1.0000 | TOPICAL_CREAM | Freq: Two times a day (BID) | CUTANEOUS | 0 refills | Status: DC
Start: 2020-09-22 — End: 2021-03-21

## 2020-09-22 NOTE — Progress Notes (Signed)
.  Pt presents for annual physical exam  

## 2020-09-24 ENCOUNTER — Telehealth: Payer: Self-pay | Admitting: *Deleted

## 2020-09-24 ENCOUNTER — Telehealth: Payer: Self-pay | Admitting: Family

## 2020-09-24 LAB — DRUG TOX ALC METAB W/CON, ORAL FLD: Alcohol Metabolite: NEGATIVE ng/mL (ref <25)

## 2020-09-24 LAB — DRUG TOX MONITOR 1 W/CONF, ORAL FLD
Amphetamines: NEGATIVE ng/mL (ref <10)
Barbiturates: NEGATIVE ng/mL (ref <10)
Benzodiazepines: NEGATIVE ng/mL (ref <0.50)
Benzoylecgonine: 21.7 ng/mL — ABNORMAL HIGH (ref <5.0)
Buprenorphine: NEGATIVE ng/mL (ref <0.10)
Cocaine: NEGATIVE ng/mL (ref <5.0)
Cocaine: POSITIVE ng/mL — AB (ref <5.0)
Codeine: NEGATIVE ng/mL (ref <2.5)
Dihydrocodeine: NEGATIVE ng/mL (ref <2.5)
Fentanyl: NEGATIVE ng/mL (ref <0.10)
Heroin Metabolite: NEGATIVE ng/mL (ref <1.0)
Hydrocodone: 8 ng/mL — ABNORMAL HIGH (ref <2.5)
Hydromorphone: NEGATIVE ng/mL (ref <2.5)
MARIJUANA: NEGATIVE ng/mL (ref <2.5)
MDMA: NEGATIVE ng/mL (ref <10)
Meprobamate: NEGATIVE ng/mL (ref <2.5)
Methadone: NEGATIVE ng/mL (ref <5.0)
Morphine: NEGATIVE ng/mL (ref <2.5)
Nicotine Metabolite: NEGATIVE ng/mL (ref <5.0)
Norhydrocodone: NEGATIVE ng/mL (ref <2.5)
Noroxycodone: NEGATIVE ng/mL (ref <2.5)
Opiates: POSITIVE ng/mL — AB (ref <2.5)
Oxycodone: NEGATIVE ng/mL (ref <2.5)
Oxymorphone: NEGATIVE ng/mL (ref <2.5)
Phencyclidine: NEGATIVE ng/mL (ref <10)
Tapentadol: NEGATIVE ng/mL (ref <5.0)
Tramadol: NEGATIVE ng/mL (ref <5.0)
Zolpidem: NEGATIVE ng/mL (ref <5.0)

## 2020-09-24 NOTE — Telephone Encounter (Signed)
Patients wife is also asking if the FMLA papers for her have been sent off yet. She would like a call back please.

## 2020-09-24 NOTE — Telephone Encounter (Addendum)
Oral swab drug screen is positive for Cocaine and its metabolite Benzoylecgonine as well as hydrocodone. Letter sent through MyChart and USPS informing [name] of non narcotic treatment only from [facility].   ----- Message from Horton Chin, MD sent at 09/24/2020 10:35 AM EDT ----- Can you please let him know we can no longer prescribe Hydrocodone for him since oral swab sample is positive for cocaine? Can continue other treatments, thank you!

## 2020-09-24 NOTE — Telephone Encounter (Signed)
Patients wife is calling wanting to know about getting her husbands labs done at [facility]. She stated she was waiting to here back from the nurse, so she knows when and where to go.

## 2020-09-28 ENCOUNTER — Other Ambulatory Visit (HOSPITAL_COMMUNITY): Payer: Self-pay

## 2020-09-28 NOTE — Telephone Encounter (Signed)
Patient wife came in the office asking if there was any word on when the patient can go to [facility] to get his blood drawn. Call back number is [phone]. Wife is wanting to get this done since she goes back to work on Monday 10/04/2020. She states Thursday or Friday of this week would work best.

## 2020-09-29 ENCOUNTER — Encounter: Payer: Self-pay | Admitting: Nurse Practitioner

## 2020-09-29 ENCOUNTER — Ambulatory Visit: Payer: 59 | Attending: Family

## 2020-09-29 ENCOUNTER — Other Ambulatory Visit: Payer: Self-pay

## 2020-09-29 ENCOUNTER — Ambulatory Visit (INDEPENDENT_AMBULATORY_CARE_PROVIDER_SITE_OTHER): Payer: 59 | Admitting: Nurse Practitioner

## 2020-09-29 ENCOUNTER — Telehealth: Payer: Self-pay

## 2020-09-29 VITALS — BP 138/76 | HR 78 | Ht 70.0 in | Wt 228.0 lb

## 2020-09-29 DIAGNOSIS — Z0271 Encounter for disability determination: Secondary | ICD-10-CM

## 2020-09-29 DIAGNOSIS — Z1159 Encounter for screening for other viral diseases: Secondary | ICD-10-CM

## 2020-09-29 DIAGNOSIS — Z125 Encounter for screening for malignant neoplasm of prostate: Secondary | ICD-10-CM

## 2020-09-29 DIAGNOSIS — Z131 Encounter for screening for diabetes mellitus: Secondary | ICD-10-CM

## 2020-09-29 DIAGNOSIS — Z13220 Encounter for screening for lipoid disorders: Secondary | ICD-10-CM

## 2020-09-29 DIAGNOSIS — Z13228 Encounter for screening for other metabolic disorders: Secondary | ICD-10-CM

## 2020-09-29 DIAGNOSIS — Z1211 Encounter for screening for malignant neoplasm of colon: Secondary | ICD-10-CM | POA: Diagnosis not present

## 2020-09-29 DIAGNOSIS — Z130 Encounter for screening for diseases of the blood and blood-forming organs and certain disorders involving the immune mechanism: Secondary | ICD-10-CM

## 2020-09-29 DIAGNOSIS — Z1329 Encounter for screening for other suspected endocrine disorder: Secondary | ICD-10-CM

## 2020-09-29 NOTE — Telephone Encounter (Signed)
Discussed pt ambulatory status and inability to lay on L side d/t h/o stroke and shoulder pain with anesthesia at [facility]. Given these concerns, pt will need to be scheduled at [facility] for procedure. Pt has now been scheduled on 12/09/20 @ 8:30am with arrival time of 7am. Pt also scheduled for pre-visit appt 11/29/20 @ 3:30pm to review prep instructions. Pt is aware of all this information. Expressed appreciation, acceptance and understanding.  Routing this message to Dr. Pearlean Brownie so that he is aware of procedure date/time as well as pre-visit date/time.

## 2020-09-29 NOTE — Telephone Encounter (Signed)
Pt may go to [facility] to have labs completed [address] [employer]

## 2020-09-29 NOTE — Patient Instructions (Addendum)
It was my pleasure to provide care to you today. Based on our discussion, I am providing you with my recommendations below:  RECOMMENDATION(S):   PROCEDURE:  I am recommending that you have a(n) Colonoscopy completed. Unfortunately, we are unable to proceed with scheduling your procedure today because we need to determine which location will be most appropriate and safe to meet your needs. Once we have determined the most appropriate location, we will call you to schedule your procedure.  NOTE:   At the time of scheduling your procedure, you will also be scheduled for a pre-visit with my nurse. During this appointment, you will be provided with your prep instructions. In addition, you will require Neurological Clearance with your provider. Again, we will call you with further instructions AFTER your provider has responded.  FOLLOW UP:  After your procedure, you will receive a call from my office staff regarding my recommendation for follow up.  BMI:  If you are age 49 or younger, your body mass index should be between 19-25. Your Body mass index is 32.71 kg/m. If this is out of the aformentioned range listed, please consider follow up with your Primary Care Provider.   MY CHART:  The [facility] providers would like to encourage you to use [facility] to communicate with providers for non-urgent requests or questions.  Due to long hold times on the telephone, sending your provider a message by [facility] may be a faster and more efficient way to get a response.  Please allow 48 business hours for a response.  Please remember that this is for non-urgent requests.   Thank you for trusting me with your gastrointestinal care!    Willette Cluster, NP

## 2020-09-29 NOTE — Addendum Note (Signed)
Addended by: Margorie John on: 09/29/2020 04:41 PM   Modules accepted: Orders

## 2020-09-29 NOTE — Progress Notes (Signed)
ASSESSMENT AND PLAN    # 55 yo male for colon cancer screening. No bowel changes, blood in stool or [facility] of colon cancer. He has new mild [place] anemia developing during May-June 2022 hospitalization for right thalamic ICH with IVH, see below.  --Patient will be scheduled for a screening colonoscopy unless Neurologist has any concerns. The risks and benefits of colonoscopy with possible polypectomy / biopsies were discussed and the patient agrees to proceed. He has left sided weakness post right hemorrhagic stroke.  He ambulates with a hemi walker but needs assistance rolling onto his side.  He does not seem to meet criteria for the procedure to be done at the [facility] so will schedule him for the hospital.  --Will ask Neurology for clearance.  In mid November he will be 6 months out from hemorrhagic stroke and he has not had a seizure since the stroke  # HTN. BP 138 / 76 today. On several anti-hypertensive medications.   # Mild [place] anemia, new. Developed during May admission for hemorrhagic stroke.  Hgb 11.4 at last check late June 2022  --Repeat CBC today.   #Chronic combined systolic and diastolic heart failure. Severe LVH.   EF 65 to 70% on echo May 2020.  --LLE edema but side of weakness from CVA so not unexpected. Lungs clear. No SOB.   HISTORY OF PRESENT ILLNESS     Chief Complaint : Colon cancer screening  [name] is a 55 y.o. 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   Patient is referred by PCP for colon cancer screening. He had had a hemorrhagic stroke secondary to HTN in May 2022. Post stroke he had a seizure ( while still hospitalized). He was transferred to inpatient Rehab where he stayed until late June. He is maintained on medication for seizure prevention and says he hasn't had a seizures since discharge. He has left sided  weakness, ambulates with a hemi walker. He has no GI complaints. Specifically he has no bowel changes, abdominal pain or blood in stool. No [place] of colon cancer. He has mild chronic [place] anemia ( evolved during hospital admission)   Patient has a remote history of combined heart failure  DATA REVIEW May 2022 Echocardiogram 1. Left ventricular ejection fraction, by estimation, is 65 to 70%. The left ventricle has normal function. The left ventricle has no regional wall motion abnormalities. There is severe left ventricular hypertrophy. Indeterminate diastolic filling due to E-A fusion. 2. Right ventricular systolic function is normal. The right ventricular size is normal. There is normal pulmonary artery systolic pressure. The estimated right ventricular systolic pressure is 45.4 mmHg. 3. The mitral valve is normal in structure. Trivial mitral valve regurgitation. No evidence of mitral stenosis. 4. The aortic valve is normal in structure. Aortic valve regurgitation is trivial. No aortic stenosis is present. 5. The inferior vena cava is normal in size with greater than 50% respiratory variability, suggesting right atrial pressure of 3 mmHg. 6. Increased flow velocities may be secondary to anemia, thyrotoxicosis, hyperdynamic or high flow state.  PREVIOUS GI EVALUATIONS:   none   Past Medical History:  Diagnosis Date   Chronic combined systolic and diastolic CHF (congestive heart failure) (HCC)    EF 09-81%   Diastolic dysfunction    Dyslipidemia    Hyperlipidemia    Hypertension  Nonischemic dilated cardiomyopathy ([place])    felt secondary to HTN with no ischemia on nuclear stress test 05/2012, EF 25-30%   Shortness of breath      Past Surgical History:  Procedure Laterality Date   KNEE ARTHROSCOPY Left 04/13/2016   Procedure: ARTHROSCOPY KNEE WITH DEBRIDEMENT;  Surgeon: Garald Balding, MD;  Location: [facility];  Service: Orthopedics;  Laterality: Left;   KNEE  ARTHROSCOPY WITH MEDIAL MENISECTOMY Right 04/13/2016   Procedure: KNEE ARTHROSCOPY WITH MEDIAL MENISECTOMY;  Surgeon: Garald Balding, MD;  Location: [facility];  Service: Orthopedics;  Laterality: Right;   NASAL FRACTURE SURGERY     Family History  Problem Relation Age of Onset   Hypertension Mother        Passed away when she is 69 secondary to hip surgery complications   CAD Mother    Heart attack Mother    Hypertension Father    Diabetes Brother    Stroke Brother    Heart attack Maternal Grandmother    Colon cancer Neg Hx    Esophageal cancer Neg Hx    Pancreatic cancer Neg Hx    Stomach cancer Neg Hx    Social History   Tobacco Use   Smoking status: Never   Smokeless tobacco: Never  Vaping Use   Vaping Use: Never used  Substance Use Topics   Alcohol use: Not Currently   Drug use: No   Current Outpatient Medications  Medication Sig Dispense Refill   amLODipine (NORVASC) 10 MG tablet Take 1 tablet (10 mg total) by mouth daily. 90 tablet 0   atorvastatin (LIPITOR) 40 MG tablet Take 1 tablet (40 mg total) by mouth daily at 6 PM. 120 tablet 0   Blood Pressure KIT 1 application by Does not apply route 2 (two) times daily. 1 kit 0   cloNIDine (CATAPRES) 0.1 MG tablet Take 1 tablet (0.1 mg total) by mouth 3 (three) times daily. 90 tablet 11   diclofenac Sodium (VOLTAREN) 1 % GEL Apply 2 g topically 4 (four) times daily. 734 g 0   folic acid (FOLVITE) 1 MG tablet Take 1 tablet (1 mg total) by mouth daily. 90 tablet 3   gabapentin (NEURONTIN) 100 MG capsule Take 200 mg by mouth 3 (three) times daily.     gabapentin (NEURONTIN) 300 MG capsule Take 1 capsule (300 mg total) by mouth 3 (three) times daily. 90 capsule 3   hydrocerin (EUCERIN) CREA Apply 1 application topically 2 (two) times daily. 228 g 0   HYDROcodone-acetaminophen (NORCO/VICODIN) 5-325 MG tablet Take 1 tablet by mouth at bedtime as needed for severe pain. 30 tablet 0   labetalol (NORMODYNE) 100 MG  tablet Take 1 tablet (100 mg total) by mouth 2 (two) times daily. 180 tablet 0   levETIRAcetam (KEPPRA) 500 MG tablet Take 1 tablet (500 mg total) by mouth 2 (two) times daily. 180 tablet 3   lidocaine (LIDODERM) 5 % Place 2 patches onto the skin daily. To back at 8 am and remove at 8 pm daily. Has to be off for 12 hours. Purchase over the counter if insurance does not cover this. 60 patch 0   losartan (COZAAR) 50 MG tablet Take 1 tablet (50 mg total) by mouth 2 (two) times daily. 180 tablet 0   Multiple Vitamins-Minerals (CERTAVITE/ANTIOXIDANTS) TABS Take 1 tablet by mouth daily. 30 tablet 0   naphazoline-pheniramine (NAPHCON-A) 0.025-0.3 % ophthalmic solution Place 1 drop into the right eye 4 (four) times  daily as needed for eye irritation. 15 mL 0   pantoprazole (PROTONIX) 40 MG tablet Take 1 tablet (40 mg total) by mouth daily. 30 tablet 0   polyvinyl alcohol (LIQUIFILM TEARS) 1.4 % ophthalmic solution Place 1 drop into both eyes every 6 (six) hours as needed for dry eyes. 15 mL 0   senna-docusate (SENOKOT-S) 8.6-50 MG tablet Take 2 tablets by mouth daily with supper. 60 tablet 0   tamsulosin (FLOMAX) 0.4 MG CAPS capsule Take 1 capsule (0.4 mg total) by mouth daily after supper. 90 capsule 0   thiamine 100 MG tablet Take 1 tablet (100 mg total) by mouth daily. 30 tablet 0   tiZANidine (ZANAFLEX) 2 MG tablet Take 1 tablet (2 mg total) by mouth 3 (three) times daily. 90 tablet 2   topiramate (TOPAMAX) 25 MG tablet Take 2 tablets (50 mg total) by mouth daily. 180 tablet 0   triamcinolone cream (KENALOG) 0.1 % Apply 1 application topically 2 (two) times daily. 80 g 0   No current facility-administered medications for this visit.   No Known Allergies   Review of Systems: Positive for urine leakage.  All other systems reviewed and negative except where noted in HPI.    PHYSICAL EXAM :    Wt Readings from Last 3 Encounters:  09/29/20 228 lb (103.4 kg)  09/14/20 228 lb (103.4 kg)  09/03/20  228 lb (103.4 kg)    BP 138/76 (BP Location: Right Arm, Patient Position: Sitting, Cuff Size: Large)   Pulse 78   Ht _0  (1.778 m)   Wt 228 lb (103.4 kg) Comment: pt in wheelchair; pt reported weight  SpO2 98%   BMI 32.71 kg/m  Constitutional:  Pleasant male in no acute distress. In wheelchair Psychiatric: Normal mood and affect. Behavior is normal. EENT: Pupils normal.  Conjunctivae are normal. No scleral icterus. Neck supple.  Cardiovascular: Normal rate, regular rhythm, 2-3 + LLE edema Pulmonary/chest: Effort normal and breath sounds normal. No wheezing, rales or rhonchi. Abdominal: Limited exam in wheelchair . Abdomen soft, nondistended, nontender. Bowel sounds active throughout.  Neurological: Alert and oriented to person place and time.  Skin: Skin is warm and dry. No rashes noted.  Tye Savoy, NP  09/29/2020, 10:42 AM  Cc:  Referring Provider Camillia Herter, NP

## 2020-09-29 NOTE — Addendum Note (Signed)
Addended by: Margorie John on: 09/29/2020 04:44 PM   Modules accepted: Orders

## 2020-09-29 NOTE — Progress Notes (Signed)
Agree with assessment and plan as outlined.  

## 2020-09-29 NOTE — Telephone Encounter (Signed)
Pre-operative Risk Assessment     [name] 01-Sep-1965 [id number]  Procedure: Colonoscopy Anesthesia type:  MAC Procedure Date: UNDETERMINED Provider: Dr. Havery Moros  Type of Clearance needed: Neurological Clearance - uncertain if appt required for clearance  Thank you,  [facility]  Phone: [phone] Fax: [phone] ATTENTION: Jaccob Czaplicki, LPN

## 2020-09-30 ENCOUNTER — Other Ambulatory Visit: Payer: 59

## 2020-09-30 LAB — CMP14+EGFR
ALT: 22 IU/L (ref 0–44)
AST: 18 IU/L (ref 0–40)
Albumin/Globulin Ratio: 2 (ref 1.2–2.2)
Albumin: 5.1 g/dL — ABNORMAL HIGH (ref 3.8–4.9)
Alkaline Phosphatase: 105 IU/L (ref 44–121)
BUN/Creatinine Ratio: 13 (ref 9–20)
BUN: 14 mg/dL (ref 6–24)
Bilirubin Total: 0.4 mg/dL (ref 0.0–1.2)
CO2: 20 mmol/L (ref 20–29)
Calcium: 10.3 mg/dL — ABNORMAL HIGH (ref 8.7–10.2)
Chloride: 107 mmol/L — ABNORMAL HIGH (ref 96–106)
Creatinine, Ser: 1.05 mg/dL (ref 0.76–1.27)
Globulin, Total: 2.6 g/dL (ref 1.5–4.5)
Glucose: 88 mg/dL (ref 65–99)
Potassium: 3.9 mmol/L (ref 3.5–5.2)
Sodium: 143 mmol/L (ref 134–144)
Total Protein: 7.7 g/dL (ref 6.0–8.5)
eGFR: 83 mL/min/{1.73_m2} (ref >59)

## 2020-09-30 LAB — CBC
Hematocrit: 38 % (ref 37.5–51.0)
Hemoglobin: 13.4 g/dL (ref 13.0–17.7)
MCH: 30.5 pg (ref 26.6–33.0)
MCHC: 35.3 g/dL (ref 31.5–35.7)
MCV: 87 fL (ref 79–97)
Platelets: 326 10*3/uL (ref 150–450)
RBC: 4.39 x10E6/uL (ref 4.14–5.80)
RDW: 11.6 % (ref 11.6–15.4)
WBC: 5.7 10*3/uL (ref 3.4–10.8)

## 2020-09-30 LAB — HEMOGLOBIN A1C
Est. average glucose Bld gHb Est-mCnc: 94 mg/dL
Hgb A1c MFr Bld: 4.9 % (ref 4.8–5.6)

## 2020-09-30 LAB — LIPID PANEL
Chol/HDL Ratio: 3.4 ratio (ref 0.0–5.0)
Cholesterol, Total: 154 mg/dL (ref 100–199)
HDL: 45 mg/dL (ref >39)
LDL Chol Calc (NIH): 87 mg/dL (ref 0–99)
Triglycerides: 126 mg/dL (ref 0–149)
VLDL Cholesterol Cal: 22 mg/dL (ref 5–40)

## 2020-09-30 LAB — TSH: TSH: 1.11 u[IU]/mL (ref 0.450–4.500)

## 2020-09-30 LAB — HEPATITIS C ANTIBODY: Hep C Virus Ab: <0.1 s/co ratio (ref 0.0–0.9)

## 2020-09-30 LAB — PSA: Prostate Specific Ag, Serum: 3.2 ng/mL (ref 0.0–4.0)

## 2020-09-30 NOTE — Progress Notes (Signed)
Kidney function normal.   Liver function normal.   Thyroid function normal.   Prostate function normal.   No diabetes.   No anemia.   Hepatitis C negative.  Cholesterol improved since 3 months ago and back to normal. Continue Atorvastatin (Lipitor) as prescribed.

## 2020-10-01 ENCOUNTER — Other Ambulatory Visit (HOSPITAL_COMMUNITY): Payer: Self-pay

## 2020-10-04 ENCOUNTER — Telehealth: Payer: Self-pay | Admitting: Gastroenterology

## 2020-10-04 ENCOUNTER — Telehealth: Payer: Self-pay | Admitting: Adult Health

## 2020-10-04 NOTE — Telephone Encounter (Signed)
Returned call LM through front desk for Amy that we have not received surgical clearance form for patient.

## 2020-10-04 NOTE — Telephone Encounter (Addendum)
Contacted Amy back, she was unavailable, spoke to  [place], they wanted to make sure we received clearance form for this pt that was sent to Dr Pearlean Brownie

## 2020-10-04 NOTE — Telephone Encounter (Signed)
Called [facility] to f/u on this clearance to ensure this request WAS received. Staff member states she will forward this request to nurse.

## 2020-10-04 NOTE — Telephone Encounter (Signed)
[facility] office returned your call. Person who called stated that pt's physician is Dr. Pearlean Brownie so she was going to send clearance to her nurse.

## 2020-10-04 NOTE — Telephone Encounter (Signed)
[facility] (Amy) called, pt need surgical clearance. Have send a telephone encounter to Dr. Pearlean Brownie. Would like a call from the nurse.

## 2020-10-05 ENCOUNTER — Encounter: Payer: 59 | Admitting: Registered Nurse

## 2020-10-05 NOTE — Telephone Encounter (Signed)
Routing this message directly to [place], [place]

## 2020-10-05 NOTE — Telephone Encounter (Signed)
Called pt and his wife and advised about response below re: atorvastatin. Verbalized acceptance and understanding.

## 2020-10-12 ENCOUNTER — Other Ambulatory Visit: Payer: Self-pay | Admitting: Physical Medicine and Rehabilitation

## 2020-10-12 ENCOUNTER — Encounter: Payer: Self-pay | Attending: Physical Medicine and Rehabilitation | Admitting: Physical Medicine and Rehabilitation

## 2020-10-12 ENCOUNTER — Other Ambulatory Visit: Payer: Self-pay

## 2020-10-12 DIAGNOSIS — M545 Low back pain, unspecified: Secondary | ICD-10-CM

## 2020-10-12 DIAGNOSIS — G894 Chronic pain syndrome: Secondary | ICD-10-CM

## 2020-10-12 DIAGNOSIS — M79672 Pain in left foot: Secondary | ICD-10-CM

## 2020-10-12 DIAGNOSIS — M792 Neuralgia and neuritis, unspecified: Secondary | ICD-10-CM

## 2020-10-12 MED ORDER — DULOXETINE HCL 20 MG PO CPEP: 20.0000 mg | ORAL_CAPSULE | Freq: Every day | ORAL | 2 refills | Status: DC

## 2020-10-12 MED ORDER — TIZANIDINE HCL 2 MG PO TABS: 2.0000 mg | ORAL_TABLET | Freq: Three times a day (TID) | ORAL | 2 refills | Status: AC

## 2020-10-12 NOTE — Progress Notes (Signed)
Subjective:    Patient ID: [name], male    DOB: [birth year], 55 y.o.   MRN: [id number]  HPI An audio/video tele-health visit is felt to be the most appropriate encounter for this patient at this time. This is a follow up tele-visit via phone. The patient is at home. MD is at office.    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 -he has not tried anything for this pain -it is worst at night and feels like burning and tingling at his heel. -he has been doing great with therapy and is walking in his home! -his wife asks if he could get a handicap placard.  -the foot is still feeling numb with pins and needles, also painful. He is unsure whether Gabapentin helps- so far nothing he as tried has helped his neuropathy. His balance is also now off and he has been stumbling more. He actually felt that the Gabapentin made his pain worse. He has never tried Cymbalta but is willing to try it.  -Has been able to use bathroom himself! -he is working with therapy right now.  -he was feeling wobbly over the weekend so had to miss his PT session -shoulder is feeling very numb and cannot make a fist -ready to proceed with Qutenza today -His wife asks for refill of Eucerin and Diclofenac and Tizanidine.  -She asks whether he should still be taking Seroquel   Family History   Problem Relation Age of Onset   Hypertension Mother        Passed away when she is 65 secondary to hip surgery complications   CAD Mother    Heart attack Mother    Hypertension Father    Diabetes Brother    Stroke Brother    Heart attack Maternal Grandmother    Colon cancer Neg Hx    Esophageal cancer Neg Hx    Pancreatic cancer Neg Hx    Stomach cancer Neg Hx    Social History   Socioeconomic History   Marital status: Married    Spouse name: Not on file   Number of children: Not on file   Years of education: Not on file   Highest education level: Not on file  Occupational History   Not on file  Tobacco Use   Smoking status: Never   Smokeless tobacco: Never  Vaping Use   Vaping Use: Never used  Substance and Sexual Activity   Alcohol use: Not Currently   Drug use: No   Sexual activity: Yes  Other Topics Concern   Not on file  Social History Narrative   Not on file   Social Determinants of Health   Financial Resource Strain: Not on file  Food Insecurity: Not on file  Transportation Needs: Not on file  Physical Activity: Not  on file  Stress: Not on file  Social Connections: Not on file   Past Surgical History:  Procedure Laterality Date   KNEE ARTHROSCOPY Left 04/13/2016   Procedure: ARTHROSCOPY KNEE WITH DEBRIDEMENT;  Surgeon: Valeria Batman, MD;  Location: [facility];  Service: Orthopedics;  Laterality: Left;   KNEE ARTHROSCOPY WITH MEDIAL MENISECTOMY Right 04/13/2016   Procedure: KNEE ARTHROSCOPY WITH MEDIAL MENISECTOMY;  Surgeon: Valeria Batman, MD;  Location: [facility];  Service: Orthopedics;  Laterality: Right;   NASAL FRACTURE SURGERY     Past Medical History:  Diagnosis Date   Chronic combined systolic and diastolic CHF (congestive heart failure) (HCC)    EF 25-30%   Diastolic dysfunction    Dyslipidemia    Hyperlipidemia    Hypertension    Nonischemic dilated cardiomyopathy (HCC)    felt secondary to HTN with  no ischemia on nuclear stress test 05/2012, EF 25-30%   Shortness of breath    There were no vitals taken for this visit.  Opioid Risk Score:   Fall Risk Score:  `1  Depression screen PHQ 2/9  Depression screen [facility] 2/9 09/22/2020 09/14/2020 09/14/2020 07/30/2020  Decreased Interest 0 0 0 0  Down, Depressed, Hopeless 0 0 0 0  PHQ - 2 Score 0 0 0 0  Altered sleeping - 0 - 0  Tired, decreased energy - 0 - 0  Change in appetite - 0 - 0  Feeling bad or failure about yourself  - 0 - 0  Trouble concentrating - 0 - 0  Moving slowly or fidgety/restless - 0 - 0  Suicidal thoughts - 0 - 0  PHQ-9 Score - 0 - 0  Difficult doing work/chores Not difficult at all - - Not difficult at all    Review of Systems     Objective:   Physical Exam Not performed as patient was seen via phone.      Assessment & Plan:  [name] is a 55 year old man presenting today for f/u of left hemiplegia, right sided low back pain, and left sided foot pain following stroke.  1) Right sided low back pain: -discussed with patient and wife that Xrs show spinal degeneration -pain appears to be more myofascial -trigger point injections with lidocaine provided <1 week of benefit -Would like to try Botox injections for longer relief as this pain is greatly inhibiting patient's function and quality of life -continue heat and lidocaine patch and home physical and occupational therapy -Can take tylenol 500mg  three times per day -minimize use of Norco as much as possible as not benefiting and is a high risk opioid -can continue tizandine as needed.   2) left sided foot pain -stop Gabapentin  -start Cymbalta 20mg  daily, discussed side effects of nausea.  -recommended icing 15 minutes three times per day -hold off on therapy until neuropathy improves -can keep compression garment and boot off since these were worsening his pain -Will not repeat Qutenza since it did not did help foot pain -Turmeric to reduce  inflammation--can be used in cooking or taken as a supplement.  Benefits of turmeric:  -Highly anti-inflammatory  -Increases antioxidants  -Improves memory, attention, brain disease  -Lowers risk of heart disease  -May help prevent cancer  -Decreases pain  -Alleviates depression  -Delays aging and decreases risk of chronic disease  -Consume with black pepper to increase absorption    3) CVA -provided with a handicap placard.   4) Left sided arm pain: -  has felt numbness and weakness in his upper extremity.  -refilled tizanidine, lidocaine patch, and diclofenac gel -can consider repeating Qutenza as it did help with his shoulder pain.  -continue tylenol before sleep  5) Agitation: resolved, may discontinue seroquel.  11 minutes spent in discussion of left foot neuropathy, left shoulder pain, trying Cymbalta, Tizanidine, Lidocaine patch, turmeric, texting address of [facility] in [place].

## 2020-10-13 ENCOUNTER — Telehealth: Payer: Self-pay

## 2020-10-13 NOTE — Telephone Encounter (Signed)
PA submitted for Lidocaine 5% 

## 2020-10-14 NOTE — Telephone Encounter (Signed)
Lidocaine patches denied

## 2020-10-19 DIAGNOSIS — I610 Nontraumatic intracerebral hemorrhage in hemisphere, subcortical: Secondary | ICD-10-CM | POA: Diagnosis not present

## 2020-10-19 DIAGNOSIS — R569 Unspecified convulsions: Secondary | ICD-10-CM | POA: Diagnosis not present

## 2020-10-19 DIAGNOSIS — I5042 Chronic combined systolic (congestive) and diastolic (congestive) heart failure: Secondary | ICD-10-CM | POA: Diagnosis not present

## 2020-10-25 ENCOUNTER — Other Ambulatory Visit: Payer: Self-pay | Admitting: Physical Medicine and Rehabilitation

## 2020-10-25 MED ORDER — DULOXETINE HCL 20 MG PO CPEP: 40.0000 mg | ORAL_CAPSULE | Freq: Every day | ORAL | 2 refills | Status: DC

## 2020-10-27 DIAGNOSIS — R569 Unspecified convulsions: Secondary | ICD-10-CM | POA: Diagnosis not present

## 2020-10-27 DIAGNOSIS — I5042 Chronic combined systolic (congestive) and diastolic (congestive) heart failure: Secondary | ICD-10-CM | POA: Diagnosis not present

## 2020-10-27 DIAGNOSIS — I610 Nontraumatic intracerebral hemorrhage in hemisphere, subcortical: Secondary | ICD-10-CM | POA: Diagnosis not present

## 2020-11-05 DIAGNOSIS — I610 Nontraumatic intracerebral hemorrhage in hemisphere, subcortical: Secondary | ICD-10-CM | POA: Diagnosis not present

## 2020-11-05 DIAGNOSIS — I69391 Dysphagia following cerebral infarction: Secondary | ICD-10-CM | POA: Diagnosis not present

## 2020-11-05 DIAGNOSIS — G8194 Hemiplegia, unspecified affecting left nondominant side: Secondary | ICD-10-CM | POA: Diagnosis not present

## 2020-11-09 ENCOUNTER — Other Ambulatory Visit: Payer: Self-pay | Admitting: Family

## 2020-11-09 ENCOUNTER — Other Ambulatory Visit: Payer: Self-pay | Admitting: Physical Medicine and Rehabilitation

## 2020-11-09 DIAGNOSIS — G441 Vascular headache, not elsewhere classified: Secondary | ICD-10-CM

## 2020-11-09 DIAGNOSIS — R3911 Hesitancy of micturition: Secondary | ICD-10-CM

## 2020-11-09 DIAGNOSIS — I10 Essential (primary) hypertension: Secondary | ICD-10-CM

## 2020-11-09 DIAGNOSIS — R3915 Urgency of urination: Secondary | ICD-10-CM

## 2020-11-09 DIAGNOSIS — R569 Unspecified convulsions: Secondary | ICD-10-CM

## 2020-11-09 DIAGNOSIS — R109 Unspecified abdominal pain: Secondary | ICD-10-CM

## 2020-11-09 DIAGNOSIS — Z125 Encounter for screening for malignant neoplasm of prostate: Secondary | ICD-10-CM

## 2020-11-10 ENCOUNTER — Telehealth: Payer: Self-pay | Admitting: Family

## 2020-11-10 DIAGNOSIS — R569 Unspecified convulsions: Secondary | ICD-10-CM

## 2020-11-10 DIAGNOSIS — I10 Essential (primary) hypertension: Secondary | ICD-10-CM

## 2020-11-10 DIAGNOSIS — G441 Vascular headache, not elsewhere classified: Secondary | ICD-10-CM

## 2020-11-10 NOTE — Telephone Encounter (Signed)
URGENT - PT COMPLETELY OUT  amLODipine (NORVASC) 10 MG tablet [[id number]]  ENDED  labetalol (NORMODYNE) 100 MG tablet [[id number]]  ENDED  losartan (COZAAR) 50 MG tablet [[id number]]  ENDED  tamsulosin (FLOMAX) 0.4 MG CAPS capsule [[id number]]  ENDED   topiramate (TOPAMAX) 25 MG tablet [[id number]]  ENDED   Pharmacy  [facility] [address] ([address] [place] - [address]  [address] [place] ([place]) [place] [address]  Phone:  [phone]  Fax:  [phone]

## 2020-11-11 MED ORDER — LOSARTAN POTASSIUM 50 MG PO TABS: 50.0000 mg | ORAL_TABLET | Freq: Two times a day (BID) | ORAL | 0 refills | Status: DC

## 2020-11-11 MED ORDER — AMLODIPINE BESYLATE 10 MG PO TABS: 10.0000 mg | ORAL_TABLET | Freq: Every day | ORAL | 0 refills | Status: DC

## 2020-11-11 MED ORDER — TOPIRAMATE 25 MG PO TABS: 50.0000 mg | ORAL_TABLET | Freq: Every day | ORAL | 0 refills | Status: DC

## 2020-11-11 MED ORDER — LABETALOL HCL 100 MG PO TABS: 100.0000 mg | ORAL_TABLET | Freq: Two times a day (BID) | ORAL | 0 refills | Status: DC

## 2020-11-11 NOTE — Telephone Encounter (Signed)
Patient established with Urology for management of Tamsulosin (Flomax). Encouraged to request medication refill from the same.

## 2020-11-11 NOTE — Telephone Encounter (Signed)
Rx sent  amLODipine (NORVASC) 10 MG tablet [[id number]]  ENDED   labetalol (NORMODYNE) 100 MG tablet [[id number]]  ENDED   losartan (COZAAR) 50 MG tablet [[id number]]  ENDED     topiramate (TOPAMAX) 25 MG tablet [[id number]]  ENDED   Tamsulosin is not on medication list. Will forward to PCP to advise.

## 2020-11-12 ENCOUNTER — Other Ambulatory Visit: Payer: Self-pay | Admitting: Physical Medicine and Rehabilitation

## 2020-11-12 MED ORDER — TAMSULOSIN HCL 0.4 MG PO CAPS: 0.4000 mg | ORAL_CAPSULE | Freq: Every day | ORAL | 3 refills | Status: DC

## 2020-11-12 NOTE — Telephone Encounter (Signed)
Patient's S.O. aware that Rx for Tamsulosin 0.4 mg will need to come from Urology office. Verbalized understanding.

## 2020-11-18 DIAGNOSIS — I5042 Chronic combined systolic (congestive) and diastolic (congestive) heart failure: Secondary | ICD-10-CM | POA: Diagnosis not present

## 2020-11-18 DIAGNOSIS — R569 Unspecified convulsions: Secondary | ICD-10-CM | POA: Diagnosis not present

## 2020-11-18 DIAGNOSIS — I610 Nontraumatic intracerebral hemorrhage in hemisphere, subcortical: Secondary | ICD-10-CM | POA: Diagnosis not present

## 2020-11-22 ENCOUNTER — Other Ambulatory Visit: Payer: Self-pay | Admitting: Physical Medicine and Rehabilitation

## 2020-11-22 DIAGNOSIS — G8194 Hemiplegia, unspecified affecting left nondominant side: Secondary | ICD-10-CM

## 2020-11-22 DIAGNOSIS — I610 Nontraumatic intracerebral hemorrhage in hemisphere, subcortical: Secondary | ICD-10-CM

## 2020-11-26 DIAGNOSIS — H35033 Hypertensive retinopathy, bilateral: Secondary | ICD-10-CM | POA: Diagnosis not present

## 2020-11-26 DIAGNOSIS — H5201 Hypermetropia, right eye: Secondary | ICD-10-CM | POA: Diagnosis not present

## 2020-11-27 DIAGNOSIS — I610 Nontraumatic intracerebral hemorrhage in hemisphere, subcortical: Secondary | ICD-10-CM | POA: Diagnosis not present

## 2020-11-27 DIAGNOSIS — R569 Unspecified convulsions: Secondary | ICD-10-CM | POA: Diagnosis not present

## 2020-11-27 DIAGNOSIS — I5042 Chronic combined systolic (congestive) and diastolic (congestive) heart failure: Secondary | ICD-10-CM | POA: Diagnosis not present

## 2020-11-29 ENCOUNTER — Other Ambulatory Visit: Payer: Self-pay

## 2020-11-29 ENCOUNTER — Ambulatory Visit (AMBULATORY_SURGERY_CENTER): Payer: 59 | Admitting: *Deleted

## 2020-11-29 ENCOUNTER — Encounter (HOSPITAL_COMMUNITY): Payer: Self-pay | Admitting: Gastroenterology

## 2020-11-29 VITALS — Ht 70.0 in | Wt 215.0 lb

## 2020-11-29 DIAGNOSIS — I610 Nontraumatic intracerebral hemorrhage in hemisphere, subcortical: Secondary | ICD-10-CM | POA: Diagnosis not present

## 2020-11-29 DIAGNOSIS — I5042 Chronic combined systolic (congestive) and diastolic (congestive) heart failure: Secondary | ICD-10-CM | POA: Diagnosis not present

## 2020-11-29 DIAGNOSIS — R569 Unspecified convulsions: Secondary | ICD-10-CM | POA: Diagnosis not present

## 2020-11-29 DIAGNOSIS — Z1211 Encounter for screening for malignant neoplasm of colon: Secondary | ICD-10-CM

## 2020-11-29 MED ORDER — PEG 3350-KCL-NA BICARB-NACL 420 G PO SOLR: 4000.0000 mL | Freq: Once | ORAL | 0 refills | Status: AC

## 2020-11-29 NOTE — Progress Notes (Signed)
No egg or soy allergy known to patient  No issues known to pt with past sedation with any surgeries or procedures Patient denies ever being told they had issues or difficulty with intubation  No FH of Malignant Hyperthermia Pt is not on diet pills Pt is not on  home 02  Pt is not on blood thinners  Pt denies issues with constipation -  No A fib or A flutter  Pt has issues with left shoulder since CVA 05-2020- please be gentle with positioning to left side with Colon   Pt is fully vaccinated  for Covid     NO PA's for preps discussed with pt In PV today  Discussed with pt there will be an out-of-pocket cost for prep and that varies from $0 to 70 +  dollars - pt verbalized understanding   Due to the COVID-19 pandemic we are asking patients to follow certain guidelines in PV and the [facility]   Pt aware of COVID protocols and [facility] guidelines

## 2020-11-30 ENCOUNTER — Encounter: Payer: Self-pay | Admitting: Gastroenterology

## 2020-12-07 ENCOUNTER — Ambulatory Visit: Payer: BC Managed Care – PPO | Admitting: Occupational Therapy

## 2020-12-07 ENCOUNTER — Encounter: Payer: Self-pay | Admitting: Physical Therapy

## 2020-12-07 ENCOUNTER — Encounter: Payer: Self-pay | Admitting: Occupational Therapy

## 2020-12-07 ENCOUNTER — Ambulatory Visit: Payer: BC Managed Care – PPO | Attending: Physical Medicine and Rehabilitation | Admitting: Physical Therapy

## 2020-12-07 ENCOUNTER — Other Ambulatory Visit: Payer: Self-pay

## 2020-12-07 DIAGNOSIS — R208 Other disturbances of skin sensation: Secondary | ICD-10-CM

## 2020-12-07 DIAGNOSIS — I610 Nontraumatic intracerebral hemorrhage in hemisphere, subcortical: Secondary | ICD-10-CM | POA: Insufficient documentation

## 2020-12-07 DIAGNOSIS — R2689 Other abnormalities of gait and mobility: Secondary | ICD-10-CM | POA: Insufficient documentation

## 2020-12-07 DIAGNOSIS — G8929 Other chronic pain: Secondary | ICD-10-CM

## 2020-12-07 DIAGNOSIS — R2681 Unsteadiness on feet: Secondary | ICD-10-CM | POA: Insufficient documentation

## 2020-12-07 DIAGNOSIS — R278 Other lack of coordination: Secondary | ICD-10-CM | POA: Diagnosis not present

## 2020-12-07 DIAGNOSIS — R41842 Visuospatial deficit: Secondary | ICD-10-CM | POA: Diagnosis not present

## 2020-12-07 DIAGNOSIS — M25512 Pain in left shoulder: Secondary | ICD-10-CM | POA: Insufficient documentation

## 2020-12-07 DIAGNOSIS — M6281 Muscle weakness (generalized): Secondary | ICD-10-CM | POA: Diagnosis not present

## 2020-12-07 DIAGNOSIS — G8194 Hemiplegia, unspecified affecting left nondominant side: Secondary | ICD-10-CM | POA: Diagnosis not present

## 2020-12-07 DIAGNOSIS — I69354 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-dominant side: Secondary | ICD-10-CM | POA: Diagnosis not present

## 2020-12-07 DIAGNOSIS — I69154 Hemiplegia and hemiparesis following nontraumatic intracerebral hemorrhage affecting left non-dominant side: Secondary | ICD-10-CM | POA: Diagnosis not present

## 2020-12-08 ENCOUNTER — Ambulatory Visit: Payer: BC Managed Care – PPO | Admitting: Adult Health

## 2020-12-08 NOTE — Therapy (Signed)
[facility] [address] [place], [place], [address] Phone: [phone]   Fax:  [phone]  Occupational Therapy Evaluation  Patient Details  Name: [name] MRN: [id number] Date of Birth: 1965-12-23 Referring Provider (OT): Sula Soda, MD   Encounter Date: 12/07/2020   OT End of Session - 12/07/20 1558     Visit Number 1    Number of Visits 13    Date for OT Re-Evaluation 02/01/21   8 week plan of care   Authorization Type BCBS    Authorization Time Period 1st 5 visits are at no charge, VL: PT/OT combined 30    Authorization - Number of Visits 15    OT Start Time 1615    OT Stop Time 1705    OT Time Calculation (min) 50 min    Activity Tolerance Patient tolerated treatment well    Behavior During Therapy [facility] for tasks assessed/performed             Past Medical History:  Diagnosis Date   Anxiety    Cataract    removed left eye   Chronic combined systolic and diastolic CHF (congestive heart failure) (HCC)    EF 25-30%   Diastolic dysfunction    Dyslipidemia    Hyperlipidemia    Hypertension    Neuromuscular disorder (HCC)    neuropathy left foot   Nonischemic dilated cardiomyopathy (HCC)    felt secondary to HTN with no ischemia on nuclear stress test 05/2012, EF 25-30%   Seizures (HCC)    last seizure 05-2020- on Keppra   Shortness of breath    Stroke (HCC) 05/2020    Past Surgical History:  Procedure Laterality Date   KNEE ARTHROSCOPY Left 04/13/2016   Procedure: ARTHROSCOPY KNEE WITH DEBRIDEMENT;  Surgeon: Valeria Batman, MD;  Location: [facility];  Service: Orthopedics;  Laterality: Left;   KNEE ARTHROSCOPY WITH MEDIAL MENISECTOMY Right 04/13/2016   Procedure: KNEE ARTHROSCOPY WITH MEDIAL MENISECTOMY;  Surgeon: Valeria Batman, MD;  Location: [facility];  Service: Orthopedics;  Laterality: LEFT not right knee   NASAL FRACTURE SURGERY     SHOULDER  SURGERY Left     There were no vitals filed for this visit.   Subjective Assessment - 12/07/20 1734     Subjective  Pt is a 55 year old male that presents to Neuro OPOT s/p Thalamic Hemorrhage and Left Hemiplegia from May 2022. Pt is present with spouse. Pt reports primary concern is with walking and using his LUE. Pt is living with spouse in a one story home and was independent prior to CVA. Pt is currently not completing any IADLs and is completing ADLs with some assistance from spouse. Pt reports some minor visual deficits, possibly diplopia with far gaze.    Patient is accompanied by: Family member   spouse   Pertinent History hypertension, obesity, cardiomyopathy, hyperlipidemia, CHF    Limitations Fall Risk. Sensory Deficits LUE    Patient Stated Goals driving, cooking, walking, bathing himself    Currently in Pain? Yes    Pain Score 6     Pain Location Foot    Pain Orientation Left    Pain Descriptors / Indicators Tingling;Pins and needles;Numbness    Pain Type Neuropathic pain    Pain Onset More than a month ago    Pain Frequency Constant               [facility] OT Assessment - 12/07/20 1601  Assessment   Medical Diagnosis Thalamic Hemorrhage / L Hemiplegia    Referring Provider (OT) Sula Soda, MD    Onset Date/Surgical Date 06/08/20    Hand Dominance Right    Prior Therapy inpatient rehab and [facility]      Precautions   Precautions Fall    Precaution Comments fall risk, left hemiplegia      Balance Screen   Has the patient fallen in the past 6 months Yes    How many times? 2   defer to PT     Home  Environment   Family/patient expects to be discharged to: Private residence    Living Arrangements Spouse/significant other    Type of Home House    Home Layout Able to live on main level with bedroom/bathroom    Bathroom Ambulance person Yes    Home Equipment Shower seat   uses his son in law's shower with seat   Lives With  Spouse      Prior Function   Level of Independence Independent    Vocation Full time employment;On disability   on disability now   Investment banker, corporate    Leisure tennis, workout, grill      ADL   Eating/Feeding Needs assist with cutting food    Grooming Modified independent    Upper Body Bathing Minimal assistance   needs assistance with RUE   Lower Body Bathing Supervision/safety    Upper Body Dressing Increased time;Minimal assistance;Needs assist for fasteners    Lower Body Dressing Needs assist for fasteners    Toilet Transfer Modified independent    Toileting - Clothing Manipulation Modified independent    Toileting -  Hygiene Modified Independent    Tub/Shower Transfer Min guard    Psychologist, educational Grab bars;Walk in shower;Shower seat with back    ADL comments currently sleeping on sofa, sitting up, d/t shoulder pain      IADL   Shopping Completely unable to shop    Light Housekeeping Needs help with all home maintenance tasks    Meal Prep Does not utilize stove or oven;Needs to have meals prepared and served;Able to complete simple warm meal prep   just uses microwave   Community Mobility Relies on family or friends for transportation    Medication Management Is not capable of dispensing or managing own medication    Financial Management Dependent      Mobility   Mobility Status Comments ambulates with hemi walker      Written Expression   Dominant Hand Right      Vision - History   Baseline Vision Wears glasses only for reading    Additional Comments reports diplopia with far vision      Observation/Other Assessments   Focus on Therapeutic Outcomes (FOTO)  N/A - not acute      Sensation   Light Touch Impaired Detail    Light Touch Impaired Details Absent LUE    Hot/Cold Impaired Detail    Hot/Cold Impaired Details Impaired LUE    Proprioception Impaired Detail    Proprioception Impaired Details Impaired LUE      Coordination    Gross Motor Movements are Fluid and Coordinated No    Fine Motor Movements are Fluid and Coordinated No    Finger Nose Finger Test dysmetria and ataxia    9 Hole Peg Test --   not completed   Box and Blocks R 40, L 7    Coordination  LUE impaired      Perception   Perception Impaired    Inattention/Neglect Does not attend to left side of body      Praxis   Praxis Not tested      ROM / Strength   AROM / PROM / Strength AROM;Strength      AROM   Overall AROM  Deficits    Overall AROM Comments RUE WFL, LUE impaired. Active at all joints.    AROM Assessment Site Shoulder    Right/Left Shoulder Left    Left Shoulder Flexion 105 Degrees      Strength   Overall Strength Deficits    Overall Strength Comments Impaired LUE, RUE WFL      Hand Function   Right Hand Gross Grasp Functional    Right Hand Grip (lbs) 100.7    Left Hand Gross Grasp Impaired    Left Hand Grip (lbs) 20.9                                OT Short Term Goals - 12/08/20 1126       OT SHORT TERM GOAL #1   Title Pt will be independent with HEP targeting LUE range of motion and coordination    Time 4    Period Weeks    Status New    Target Date 01/05/21      OT SHORT TERM GOAL #2   Title Pt will increase box and blocks score with LUE to 10 blocks or greater for increasing functional use of LUE.    Baseline R 40, L 7    Time 4    Period Weeks    Status New      OT SHORT TERM GOAL #3   Title Pt will verbalize understanding of sensory safety strategies for increasing independence and safety    Time 4    Period Weeks    Status New      OT SHORT TERM GOAL #4   Title Pt will perform simple warm meal prep and/or light housekeeping with supervision and incresed safety awareness d/t sensory deficits.    Time 4    Period Weeks    Status New               OT Long Term Goals - 12/08/20 1128       OT LONG TERM GOAL #1   Title Pt will be independent with any updated HEPs     Time 8    Period Weeks    Status New      OT LONG TERM GOAL #2   Title Pt will perform bathing with mod I and good safety awareness consistently, per report.    Time 8    Period Weeks    Status New      OT LONG TERM GOAL #3   Title Pt will assist with at least one home management and/or meal prep task per day in order to increase independence and decrease caregiver burden.    Time 8    Period Weeks    Status New      OT LONG TERM GOAL #4   Title Pt will obtain a lightweight object from a low to mid level surface with LUE in order to increase ability to use LUE for functional reaching and use.    Time 8    Period Weeks    Status New  Plan - 12/07/20 1655     Clinical Impression Statement Pt is a 55 year old male that presents to Neuro OPOT s/p Thalamic Hemorrhage and Left Hemiplegia from May 2022. Pt presents today with L hemiplegia with LUE weakness, absent sensation, poor proprioception  and decreased coordination. Pt also presents with unsteadiness on feet impeding overall independence with ADLs and IADLs. Skilled occupational therapy is recommended to target listed areas of deficit and increase independence with ADLs and IADLs and decrease caregiver burden.    OT Occupational Profile and History Problem Focused Assessment - Including review of records relating to presenting problem    Occupational performance deficits (Please refer to evaluation for details): ADL's;IADL's;Leisure    Body Structure / Function / Physical Skills ADL;GMC;FMC;Flexibility;Coordination;Sensation;IADL;ROM;UE functional use;Proprioception;Pain;Strength;Decreased knowledge of use of DME    Rehab Potential Good    Clinical Decision Making Limited treatment options, no task modification necessary    Comorbidities Affecting Occupational Performance: None    Modification or Assistance to Complete Evaluation  No modification of tasks or assist necessary to complete eval    OT Frequency  2x / week    OT Duration 8 weeks   12 visits over 8 weeks - starting with 1x/week until determine visit limit with insurance to use visits efficiently.   OT Treatment/Interventions Self-care/ADL training;Aquatic Therapy;Patient/family education;Manual Therapy;Electrical Stimulation;Neuromuscular education;Functional Mobility Training;Passive range of motion;Visual/perceptual remediation/compensation;Therapeutic exercise;Ultrasound;Moist Heat;Fluidtherapy;DME and/or AE instruction;Therapeutic activities;Splinting    Plan supine shoulder HEP, LUE grasp/release and NMR, sensory strategies for safety    Consulted and Agree with Plan of Care Patient;Family member/caregiver    Family Member Consulted spouse             Patient will benefit from skilled therapeutic intervention in order to improve the following deficits and impairments:   Body Structure / Function / Physical Skills: ADL, GMC, FMC, Flexibility, Coordination, Sensation, IADL, ROM, UE functional use, Proprioception, Pain, Strength, Decreased knowledge of use of DME       Visit Diagnosis: Muscle weakness (generalized)  Other lack of coordination  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-dominant side (HCC)  Chronic left shoulder pain  Visuospatial deficit  Other disturbances of skin sensation    Problem List Patient Active Problem List   Diagnosis Date Noted   Left hemiplegia (HCC) 07/02/2020   Cognitive deficit due to recent stroke 07/02/2020   Dysphagia, post-stroke    Vascular headache    Acute blood loss anemia    AKI (acute kidney injury) (HCC)    Essential hypertension    Seizures (HCC)    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   ICH (intracerebral hemorrhage) (HCC) 06/09/2020   Sprain of anterior talofibular ligament of left ankle 01/15/2020   Hyperlipidemia, on Lipitor 04/01/2017   Alcohol use disorder, mild, abuse 06/30/2016   Morbid obesity (HCC) 06/30/2016    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   Chronic combined systolic and diastolic CHF (congestive heart failure) (HCC) 11/08/2012   HTN (hypertension) 05/25/2012    Junious Dresser, OT/L 12/08/2020, 11:30 AM  [facility] [address] [place], [place], [address] Phone: [phone]   Fax:  [phone]  Name: [name] MRN: [id number] Date of Birth: March 18, 1965

## 2020-12-08 NOTE — Therapy (Signed)
[facility] [address] [place], [place], [address] Phone: [phone]   Fax:  [phone]  Physical Therapy Evaluation  Patient Details  Name: [name] MRN: [id number] Date of Birth: Nov 22, 1965 Referring Provider (PT): Sula Soda, MD   Encounter Date: 12/07/2020   PT End of Session - 12/08/20 1634     Visit Number 1    Number of Visits 15   1st 5 visits are at no charge; 30 PT/OT combined   Date for PT Re-Evaluation 02/04/21    Authorization Type BCBS    PT Start Time 1545   pt arrived 86" late for appt   PT Stop Time 1615    PT Time Calculation (min) 30 min    Equipment Utilized During Treatment Gait belt    Activity Tolerance Patient tolerated treatment well    Behavior During Therapy WFL for tasks assessed/performed             Past Medical History:  Diagnosis Date   Anxiety    Cataract    removed left eye   Chronic combined systolic and diastolic CHF (congestive heart failure) (HCC)    EF 25-30%   Diastolic dysfunction    Dyslipidemia    Hyperlipidemia    Hypertension    Neuromuscular disorder (HCC)    neuropathy left foot   Nonischemic dilated cardiomyopathy (HCC)    felt secondary to HTN with no ischemia on nuclear stress test 05/2012, EF 25-30%   Seizures (HCC)    last seizure 05-2020- on Keppra   Shortness of breath    Stroke (HCC) 05/2020    Past Surgical History:  Procedure Laterality Date   KNEE ARTHROSCOPY Left 04/13/2016   Procedure: ARTHROSCOPY KNEE WITH DEBRIDEMENT;  Surgeon: Valeria Batman, MD;  Location: [facility];  Service: Orthopedics;  Laterality: Left;   KNEE ARTHROSCOPY WITH MEDIAL MENISECTOMY Right 04/13/2016   Procedure: KNEE ARTHROSCOPY WITH MEDIAL MENISECTOMY;  Surgeon: Valeria Batman, MD;  Location: [facility];  Service: Orthopedics;  Laterality: LEFT not right knee   NASAL FRACTURE SURGERY     SHOULDER SURGERY Left      There were no vitals filed for this visit.    Subjective Assessment - 12/07/20 1545     Subjective Pt is a 55 yr old gentleman s/p thalamic hemorrhage with left hemiplegia (06-09-20)  who presents to PT eval accompanied by his wife.  Pt is amb. with hemiwalker.  Arrives 15" late for eval    Patient is accompained by: Family member   wife [name]   Pertinent History HTN, cardiomyopathy, chronic combined systolic & diastolic CHF, alcohol use disorder, seizures    Patient Stated Goals "walk independently without anything"    Currently in Pain? Yes    Pain Score 6     Pain Location Foot    Pain Orientation Left    Pain Descriptors / Indicators Tingling;Pins and needles;Numbness    Pain Type Neuropathic pain    Pain Onset More than a month ago    Pain Frequency Constant    Aggravating Factors  no specific factors    Pain Relieving Factors medication helps some (gabapentin)                [facility] PT Assessment - 12/08/20 0001       Assessment   Medical Diagnosis Rt thalamic hemorrhage with Lt hemiplegia    Referring Provider (PT) Sula Soda, MD    Onset Date/Surgical  Date 06/08/20    Hand Dominance Right    Prior Therapy inpatient rehab, Home health and [facility])      Precautions   Precautions Fall    Precaution Comments fall risk, left hemiplegia      Balance Screen   Has the patient fallen in the past 6 months Yes    How many times? 2    Has the patient had a decrease in activity level because of a fear of falling?  No    Is the patient reluctant to leave their home because of a fear of falling?  No      Home Environment   Living Environment Private residence    Living Arrangements Spouse/significant other    Type of Home House    Home Access Stairs to enter   1 rail   Home Layout One level      Prior Function   Level of Independence Independent    Vocation Full time employment;On disability   on disability now   Programmer, systems    Leisure tennis, workout, grill      Observation/Other Assessments   Focus on Therapeutic Outcomes (FOTO)  N/A - not acute      Sensation   Light Touch Impaired Detail    Light Touch Impaired Details Impaired LLE      ROM / Strength   AROM / PROM / Strength AROM;Strength      AROM   Overall AROM  Deficits    Overall AROM Comments RUE and RLE WNL's:  decreased Lt hip flexion, knee flexion and dorsiflexion due to weakness      Strength   Overall Strength Deficits    Strength Assessment Site Hip;Knee;Ankle    Right/Left Hip Left    Left Hip Flexion 3/5    Right/Left Knee Left    Left Knee Flexion 3+/5    Left Knee Extension 5/5    Right/Left Ankle Left    Left Ankle Dorsiflexion 3+/5    Left Ankle Plantar Flexion 2/5      Transfers   Transfers Sit to Stand;Stand to Sit      Ambulation/Gait   Ambulation/Gait Yes    Ambulation/Gait Assistance 4: Min guard    Ambulation Distance (Feet) 80 Feet    Assistive device Hemi-walker    Gait Pattern Decreased step length - left;Decreased arm swing - left;Decreased stance time - left;Decreased hip/knee flexion - left;Decreased dorsiflexion - left;Decreased weight shift to left    Ambulation Surface Level;Indoor    Gait velocity 47.78 secs = .68 ft/sec    Stairs Yes    Stairs Assistance 4: Min assist    Stair Management Technique One rail Right;Step to pattern;Forwards    Number of Stairs 4    Height of Stairs 6    Gait Comments Lt toe scuffed floor after amb. 50' - when amb. from steps back to exam room - pt able to recover independently      Standardized Balance Assessment   Standardized Balance Assessment Timed Up and Go Test      Timed Up and Go Test   TUG Normal TUG    Normal TUG (seconds) 38.87   with hemiwalker                       Objective measurements completed on examination: See above findings.                PT Education -  12/08/20 1632     Education Details eval  results - reviewed seated exercises as instructed by [facility] PT - hip flexion and LAQ's    Person(s) Educated Patient;Spouse    Methods Explanation    Comprehension Verbalized understanding;Returned demonstration              PT Short Term Goals - 12/08/20 1649       PT SHORT TERM GOAL #1   Title Pt will improve Berg balance test score by at least 4 points to demo improvement in balance.    Time 4    Period Weeks    Status New    Target Date 01/07/21      PT SHORT TERM GOAL #2   Title Improve TUG score from 38.87 secs to </= 33 secs with use of hemiwalker to demo improvement in functional mobility.    Baseline 38.87 secs with hemiwalker    Time 4    Period Weeks    Status New    Target Date 01/07/21      PT SHORT TERM GOAL #3   Title Amb. with SBQC 230' with CGA on flat, even surface for increased community accessibility.    Baseline 65' with hemiwalker    Time 4    Period Weeks    Status New    Target Date 01/07/21      PT SHORT TERM GOAL #4   Title Independent in HEP for LLE strengthening.    Time 4    Period Weeks    Status New    Target Date 01/07/21               PT Long Term Goals - 12/08/20 1654       PT LONG TERM GOAL #1   Title Improve Berg balance test score by at least 8 points for reduced fall risk.    Time 8    Period Weeks    Status New    Target Date 02/04/21      PT LONG TERM GOAL #2   Title Increase gait velocity from .68 ft/sec to >/= 1.4 ft/sec with hemiwalker for increased gait efficiency.    Baseline 47.78 secs = .68 ft/sec    Time 8    Period Weeks    Status New    Target Date 02/04/21      PT LONG TERM GOAL #3   Title Improve TUG score to </= 30 secs with hemiwalker to reduce fall risk and to demo incr. functional mobility.    Baseline 38.87 secs with hemiwalker    Time 8    Period Weeks    Status New    Target Date 02/04/21      PT LONG TERM GOAL #4   Title Amb. 30' without use of assistive device with SBA for household  amb.    Baseline hemiwalker - 42' with SBA to CGA    Time 8    Period Weeks    Status New    Target Date 02/04/21      PT LONG TERM GOAL #5   Title Amb. 500' with SPC with SBA for incr. community accessibility.    Time 8    Period Weeks    Status New    Target Date 02/04/21      Additional Long Term Goals   Additional Long Term Goals Yes      PT LONG TERM GOAL #6   Title Perform floor to stand transfer with UE  support on object with SBA.    Time 8    Period Weeks    Status New    Target Date 02/04/21                    Plan - 12/08/20 1636     Clinical Impression Statement Pt is a 55 yr old gentleman who was admitted to [facility] on 06-09-20 after sustaining a left thalamic hemorrhage.  Pt was found on bathroom floor by his wife and had Lt sided weakness and numbness and Rt gaze preference.  Pt presented to ED and was hospitalized from 06-09-20 - 06-22-20 and then was transferred to inpatient rehab 06-22-20 - 07-21-20.  Pt was discharged home and received HH PT and OT.  Pt reports he went to [facility] Rehab (located off [address] - called [facility]) but did not make much progress so he requested to come to this facility.  Pt is ambulating with use of hemiwalker but exhibits gait deviations with limited isolated joint movement in LLE in swing due to spasticity and has decreased dorsiflexion in swing phase, however, he is able to perform isolated dorsiflexion in seated position.  Pt presents with impaired sensation in LLE with decreased strength and functional use LLE and decreased standing balance.  Pt is at high risk for fall with TUG score 38.87 secs with use of hemiwalker.  Pt will benefit from PT to address balance and gait deficits and LLE weakness and decreased functional use.    Personal Factors and Comorbidities Behavior Pattern;Comorbidity 2;Profession;Transportation    Comorbidities cardiomyopathy, systolic & diastolic CHF, ICH, seizures    Examination-Activity  Limitations Bend;Carry;Lift;Stand;Stairs;Squat;Locomotion Level;Transfers;Sleep;Bed Mobility    Examination-Participation Restrictions Cleaning;Community Activity;Driving;Shop;Laundry;Occupation;Meal Prep;Yard Work    Conservation officer, historic buildings Evolving/Moderate complexity    Clinical Decision Making Moderate    Rehab Potential Good    PT Frequency 2x / week    PT Duration 8 weeks    PT Treatment/Interventions ADLs/Self Care Home Management;Aquatic Therapy;DME Instruction;Gait training;Stair training;Therapeutic activities;Therapeutic exercise;Balance training;Orthotic Fit/Training;Patient/family education;Neuromuscular re-education    PT Next Visit Plan begin HEP for LLE strengthening and stretching; trial AFO on LLE; do Berg    Recommended Other Services pt is also receiving OT services    Consulted and Agree with Plan of Care Patient;Family member/caregiver    Family Member Consulted wife [name]             Patient will benefit from skilled therapeutic intervention in order to improve the following deficits and impairments:  Abnormal gait, Decreased activity tolerance, Decreased balance, Decreased coordination, Decreased strength, Impaired tone, Impaired sensation, Impaired UE functional use, Impaired vision/preception, Pain  Visit Diagnosis: Hemiplegia and hemiparesis following nontraumatic intracerebral hemorrhage affecting left non-dominant side (HCC) - Plan: PT plan of care cert/re-cert  Other abnormalities of gait and mobility - Plan: PT plan of care cert/re-cert  Muscle weakness (generalized) - Plan: PT plan of care cert/re-cert  Unsteadiness on feet - Plan: PT plan of care cert/re-cert     Problem List Patient Active Problem List   Diagnosis Date Noted   Left hemiplegia (HCC) 07/02/2020   Cognitive deficit due to recent stroke 07/02/2020   Dysphagia, post-stroke    Vascular headache    Acute blood loss anemia    AKI (acute kidney injury) (HCC)    Essential  hypertension    Seizures (HCC)    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   ICH (intracerebral hemorrhage) (HCC) 06/09/2020   Sprain of anterior talofibular  ligament of left ankle 01/15/2020   Hyperlipidemia, on Lipitor 04/01/2017   Alcohol use disorder, mild, abuse 06/30/2016   Morbid obesity (HCC) 06/30/2016   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   Chronic combined systolic and diastolic CHF (congestive heart failure) (HCC) 11/08/2012   HTN (hypertension) 05/25/2012    Treyden Hakim, Donavan Burnet, PT 12/08/2020, 5:10 PM  [facility] [address] [place], [place], [address] Phone: [phone]   Fax:  [phone]  Name: [name] MRN: [id number] Date of Birth: 07-06-1965

## 2020-12-09 ENCOUNTER — Other Ambulatory Visit: Payer: Self-pay

## 2020-12-09 ENCOUNTER — Ambulatory Visit (HOSPITAL_COMMUNITY)
Admission: RE | Admit: 2020-12-09 | Discharge: 2020-12-09 | Disposition: A | Discharge status: Home / Self Care | Payer: BC Managed Care – PPO | Attending: Gastroenterology | Admitting: Gastroenterology

## 2020-12-09 ENCOUNTER — Encounter (HOSPITAL_COMMUNITY)
Admission: RE | Disposition: A | Discharge status: Home / Self Care | Payer: Self-pay | Source: Home / Self Care | Attending: Gastroenterology

## 2020-12-09 ENCOUNTER — Ambulatory Visit (HOSPITAL_COMMUNITY): Payer: BC Managed Care – PPO | Admitting: Anesthesiology

## 2020-12-09 ENCOUNTER — Encounter (HOSPITAL_COMMUNITY): Payer: Self-pay | Admitting: Gastroenterology

## 2020-12-09 DIAGNOSIS — Z1211 Encounter for screening for malignant neoplasm of colon: Secondary | ICD-10-CM | POA: Diagnosis not present

## 2020-12-09 DIAGNOSIS — I110 Hypertensive heart disease with heart failure: Secondary | ICD-10-CM | POA: Insufficient documentation

## 2020-12-09 DIAGNOSIS — I5042 Chronic combined systolic (congestive) and diastolic (congestive) heart failure: Secondary | ICD-10-CM | POA: Diagnosis not present

## 2020-12-09 DIAGNOSIS — I509 Heart failure, unspecified: Secondary | ICD-10-CM | POA: Diagnosis not present

## 2020-12-09 DIAGNOSIS — I69359 Hemiplegia and hemiparesis following cerebral infarction affecting unspecified side: Secondary | ICD-10-CM | POA: Diagnosis not present

## 2020-12-09 DIAGNOSIS — E785 Hyperlipidemia, unspecified: Secondary | ICD-10-CM | POA: Diagnosis not present

## 2020-12-09 DIAGNOSIS — K648 Other hemorrhoids: Secondary | ICD-10-CM | POA: Insufficient documentation

## 2020-12-09 HISTORY — PX: COLONOSCOPY WITH PROPOFOL: SHX5780

## 2020-12-09 SURGERY — COLONOSCOPY WITH PROPOFOL
Anesthesia: Monitor Anesthesia Care

## 2020-12-09 MED ORDER — LIDOCAINE HCL (CARDIAC) PF 100 MG/5ML IV SOSY
PREFILLED_SYRINGE | INTRAVENOUS | Status: DC | PRN
  Administered 2020-12-09: 100 mg via INTRAVENOUS

## 2020-12-09 MED ORDER — PROPOFOL 500 MG/50ML IV EMUL
INTRAVENOUS | Status: DC | PRN
  Administered 2020-12-09: 125 ug/kg/min via INTRAVENOUS

## 2020-12-09 MED ORDER — LACTATED RINGERS IV SOLN: INTRAVENOUS | Status: DC

## 2020-12-09 MED ORDER — SODIUM CHLORIDE 0.9 % IV SOLN: INTRAVENOUS | Status: DC

## 2020-12-09 MED ORDER — PROPOFOL 10 MG/ML IV BOLUS
INTRAVENOUS | Status: DC | PRN
  Administered 2020-12-09: 20 mg via INTRAVENOUS
  Administered 2020-12-09: 30 mg via INTRAVENOUS
  Administered 2020-12-09: 20 mg via INTRAVENOUS

## 2020-12-09 SURGICAL SUPPLY — 21 items

## 2020-12-09 NOTE — H&P (Signed)
[facility] History and Physical   Primary Care Physician:  Camillia Herter, NP   Reason for Procedure:   Colon cancer screening  Plan:    colonoscopy     HPI: [name] is a 55 y.o. male  here for colonoscopy screening - first time exam. He has a history of CVA, hemiparesis, difficult ambulation, case done at the hospital due to immobility. Patient denies any bowel symptoms at this time. No family history of colon cancer known. Otherwise feels well without any cardiopulmonary symptoms. He is not on any anticoagulants.   Past Medical History:  Diagnosis Date   Anxiety    Cataract    removed left eye   Chronic combined systolic and diastolic CHF (congestive heart failure) (HCC)    EF 82-99%   Diastolic dysfunction    Dyslipidemia    Hyperlipidemia    Hypertension    Neuromuscular disorder (HCC)    neuropathy left foot   Nonischemic dilated cardiomyopathy ([place])    felt secondary to HTN with no ischemia on nuclear stress test 05/2012, EF 25-30%   Seizures ([place])    last seizure 05-2020- on Keppra   Shortness of breath    Stroke ([place]) 05/2020    Past Surgical History:  Procedure Laterality Date   KNEE ARTHROSCOPY Left 04/13/2016   Procedure: ARTHROSCOPY KNEE WITH DEBRIDEMENT;  Surgeon: Garald Balding, MD;  Location: [facility];  Service: Orthopedics;  Laterality: Left;   KNEE ARTHROSCOPY WITH MEDIAL MENISECTOMY Right 04/13/2016   Procedure: KNEE ARTHROSCOPY WITH MEDIAL MENISECTOMY;  Surgeon: Garald Balding, MD;  Location: [facility];  Service: Orthopedics;  Laterality: LEFT not right knee   NASAL FRACTURE SURGERY     SHOULDER SURGERY Left     Prior to Admission medications   Medication Sig Start Date End Date Taking? Authorizing Provider  amLODipine (NORVASC) 10 MG tablet Take 1 tablet (10 mg total) by mouth daily. 11/11/20 02/09/21 Yes Minette Brine, Amy J, NP  atorvastatin (LIPITOR) 40 MG tablet Take 40 mg by mouth at  bedtime.   Yes [provider]  Blood Pressure KIT 1 application by Does not apply route 2 (two) times daily. 07/21/20  Yes Love, Ivan Anchors, PA-C  cloNIDine (CATAPRES) 0.1 MG tablet Take 1 tablet (0.1 mg total) by mouth 3 (three) times daily. 09/15/20  Yes Raulkar, Clide Deutscher, MD  diclofenac Sodium (VOLTAREN) 1 % GEL Apply 2 g topically 4 (four) times daily. Patient taking differently: Apply 2 g topically 4 (four) times daily as needed (pain). 09/14/20  Yes Raulkar, Clide Deutscher, MD  DULoxetine (CYMBALTA) 20 MG capsule Take 2 capsules (40 mg total) by mouth daily. 10/25/20 10/25/21 Yes Raulkar, Clide Deutscher, MD  folic acid (FOLVITE) 1 MG tablet Take 1 tablet (1 mg total) by mouth daily. 09/15/20  Yes Raulkar, Clide Deutscher, MD  gabapentin (NEURONTIN) 100 MG capsule Take 200 mg by mouth 3 (three) times daily. 09/13/20  Yes [provider]  HYDROcodone-acetaminophen (NORCO/VICODIN) 5-325 MG tablet Take 1 tablet by mouth at bedtime as needed for severe pain. 09/14/20  Yes Raulkar, Clide Deutscher, MD  labetalol (NORMODYNE) 100 MG tablet Take 1 tablet (100 mg total) by mouth 2 (two) times daily. 11/11/20 02/09/21 Yes Minette Brine, Amy J, NP  levETIRAcetam (KEPPRA) 500 MG tablet Take 1 tablet (500 mg total) by mouth 2 (two) times daily. 09/01/20  Yes McCue, Janett Billow, NP  lidocaine (LIDODERM) 5 % PLACE 2 PATCHES TO THE SKIN ON BACK DAILY AT  8AM AND REMOVE AT 8PM. HAS TO BE OFF 12 HOURS. Patient taking differently: Place 2 patches onto the skin daily as needed (pain). 10/13/20  Yes Raulkar, Clide Deutscher, MD  losartan (COZAAR) 50 MG tablet Take 1 tablet (50 mg total) by mouth 2 (two) times daily. 11/11/20 02/09/21 Yes Minette Brine, Amy J, NP  Multiple Vitamins-Minerals (CERTAVITE/ANTIOXIDANTS) TABS Take 1 tablet by mouth daily. 07/21/20  Yes Love, Ivan Anchors, PA-C  naphazoline-pheniramine (NAPHCON-A) 0.025-0.3 % ophthalmic solution Place 1 drop into the right eye 4 (four) times daily as needed for eye irritation. 07/21/20  Yes Love,  Ivan Anchors, PA-C  polyvinyl alcohol (LIQUIFILM TEARS) 1.4 % ophthalmic solution Place 1 drop into both eyes every 6 (six) hours as needed for dry eyes. 07/21/20  Yes Love, Ivan Anchors, PA-C  senna-docusate (SENOKOT-S) 8.6-50 MG tablet Take 2 tablets by mouth daily with supper. Patient taking differently: Take 2 tablets by mouth daily as needed for moderate constipation. 07/21/20  Yes Love, Ivan Anchors, PA-C  tamsulosin (FLOMAX) 0.4 MG CAPS capsule Take 1 capsule (0.4 mg total) by mouth daily after supper. 11/12/20  Yes Raulkar, Clide Deutscher, MD  thiamine 100 MG tablet Take 1 tablet (100 mg total) by mouth daily. 07/21/20  Yes Love, Ivan Anchors, PA-C  tiZANidine (ZANAFLEX) 2 MG tablet Take 1 tablet (2 mg total) by mouth 3 (three) times daily. Patient taking differently: Take 2 mg by mouth every 8 (eight) hours as needed for muscle spasms. 10/12/20 01/10/21 Yes Raulkar, Clide Deutscher, MD  topiramate (TOPAMAX) 25 MG tablet Take 2 tablets (50 mg total) by mouth daily. 11/11/20 02/09/21 Yes Minette Brine, Amy J, NP  gabapentin (NEURONTIN) 300 MG capsule Take 1 capsule (300 mg total) by mouth 3 (three) times daily. Patient not taking: Reported on 12/07/2020 09/03/20   Raulkar, Clide Deutscher, MD  hydrocerin (EUCERIN) CREA Apply 1 application topically 2 (two) times daily. Patient not taking: Reported on 12/07/2020 09/14/20   Izora Ribas, MD  pantoprazole (PROTONIX) 40 MG tablet Take 1 tablet (40 mg total) by mouth daily. Patient not taking: Reported on 12/07/2020 07/21/20   Bary Leriche, PA-C  triamcinolone cream (KENALOG) 0.1 % Apply 1 application topically 2 (two) times daily. Patient not taking: No sig reported 09/22/20   Camillia Herter, NP    Current Facility-Administered Medications  Medication Dose Route Frequency Provider Last Rate Last Admin   0.9 %  sodium chloride infusion   Intravenous Continuous Yahayra Geis, Carlota Raspberry, MD       lactated ringers infusion   Intravenous Continuous Brodie Scovell, Carlota Raspberry, MD 20 mL/hr at  12/09/20 0817 New Bag at 12/09/20 0817    Allergies as of 09/29/2020   (No Known Allergies)    Family History  Problem Relation Age of Onset   Hypertension Mother        Passed away when she is 26 secondary to hip surgery complications   CAD Mother    Heart attack Mother    Hypertension Father    Diabetes Brother    Stroke Brother    Heart attack Maternal Grandmother    Colon cancer Neg Hx    Esophageal cancer Neg Hx    Pancreatic cancer Neg Hx    Stomach cancer Neg Hx    Colon polyps Neg Hx     Social History   Socioeconomic History   Marital status: Married    Spouse name: Not on file   Number of children: Not on file   Years of education:  Not on file   Highest education level: Not on file  Occupational History   Not on file  Tobacco Use   Smoking status: Never   Smokeless tobacco: Never  Vaping Use   Vaping Use: Never used  Substance and Sexual Activity   Alcohol use: Not Currently   Drug use: No   Sexual activity: Yes  Other Topics Concern   Not on file  Social History Narrative   Not on file   Social Determinants of Health   Financial Resource Strain: Not on file  Food Insecurity: Not on file  Transportation Needs: Not on file  Physical Activity: Not on file  Stress: Not on file  Social Connections: Not on file  Intimate Partner Violence: Not on file    Review of Systems: All other review of systems negative except as mentioned in the HPI.  Physical Exam: Vital signs BP (!) 155/108   Pulse 71   Temp 98.1 F (36.7 C) (Oral)   Resp 18   Ht 5' 10"  (1.778 m)   Wt 97.5 kg   SpO2 100%   BMI 30.85 kg/m   General:   Alert,  Well-developed, pleasant and cooperative in NAD Lungs:  Clear throughout to auscultation.   Heart:  Regular rate and rhythm Abdomen:  Soft, nontender and nondistended.   Neuro/Psych:  Alert and cooperative. Normal mood and affect. A and O x 3  Jolly Mango, MD [facility]

## 2020-12-09 NOTE — Op Note (Signed)
[facility] Patient Name: [name] Procedure Date: 12/09/2020 MRN: [id number] Attending MD: Carlota Raspberry. Havery Moros , MD Date of Birth: 1966/01/05 CSN: [id number] Age: 55 Admit Type: Outpatient Procedure:                Colonoscopy Indications:              Screening for colorectal malignant neoplasm, This                            is the patient's first colonoscopy. History of CVA,                            case done at the hospital due to hemiparesis /                            immobility Providers:                Remo Lipps P. Havery Moros, MD, Carmie End, RN,                            Frazier Richards, Technician Referring MD:              Medicines:                Monitored Anesthesia Care Complications:            No immediate complications. Estimated blood loss:                            None. Estimated Blood Loss:     Estimated blood loss: none. Procedure:                Pre-Anesthesia Assessment:                           - Prior to the procedure, a History and Physical                            was performed, and patient medications and                            allergies were reviewed. The patient's tolerance of                            previous anesthesia was also reviewed. The risks                            and benefits of the procedure and the sedation                            options and risks were discussed with the patient.                            All questions were answered, and informed consent                            was obtained. Prior Anticoagulants:  The patient has                            taken no previous anticoagulant or antiplatelet                            agents. ASA Grade Assessment: III - A patient with                            severe systemic disease. After reviewing the risks                            and benefits, the patient was deemed in                            satisfactory condition to undergo the  procedure.                           After obtaining informed consent, the colonoscope                            was passed under direct vision. Throughout the                            procedure, the patient's blood pressure, pulse, and                            oxygen saturations were monitored continuously. The                            CF-HQ190L ([id number]) Olympus colonoscope was                            introduced through the anus and advanced to the the                            cecum, identified by appendiceal orifice and                            ileocecal valve. The colonoscopy was performed                            without difficulty. The patient tolerated the                            procedure well. The quality of the bowel                            preparation was adequate. The ileocecal valve,                            appendiceal orifice, and rectum were photographed. Scope In: 8:34:49 AM Scope Out: 8:53:50 AM Scope Withdrawal Time: 0 hours 15 minutes 12 seconds  Total Procedure Duration: 0 hours 19 minutes 1  second  Findings:      The perianal and digital rectal examinations were normal.      Internal hemorrhoids were found during retroflexion.      The exam was otherwise without abnormality. No polyps noted. Time was       needed to lavage the colon to clear residual liquid stool. Impression:               - Internal hemorrhoids.                           - The examination was otherwise normal.                           - No polyps Moderate Sedation:      No moderate sedation, case performed with MAC Recommendation:           - Patient has a contact number available for                            emergencies. The signs and symptoms of potential                            delayed complications were discussed with the                            patient. Return to normal activities tomorrow.                            Written discharge instructions were  provided to the                            patient.                           - Resume previous diet.                           - Continue present medications.                           - Repeat colonoscopy in 10 years for screening                            purposes. Procedure Code(s):        --- Professional ---                           [phone], Colonoscopy, flexible; diagnostic, including                            collection of specimen(s) by brushing or washing,                            when performed (separate procedure) Diagnosis Code(s):        --- Professional ---  Z12.11, Encounter for screening for malignant                            neoplasm of colon                           K64.8, Other hemorrhoids CPT copyright 2019 [employer]. All rights reserved. The codes documented in this report are preliminary and upon coder review may  be revised to meet current compliance requirements. Remo Lipps P. Tressy Kunzman, MD 12/09/2020 9:01:57 AM This report has been signed electronically. Number of Addenda: 0

## 2020-12-09 NOTE — Discharge Instructions (Signed)

## 2020-12-09 NOTE — Anesthesia Preprocedure Evaluation (Addendum)
Anesthesia Evaluation  Patient identified by MRN, date of birth, ID band  Reviewed: Allergy & Precautions, NPO status , Patient's Chart, lab work & pertinent test results  Airway Mallampati: II  TM Distance: >3 FB Neck ROM: Full    Dental no notable dental hx.    Pulmonary shortness of breath,    Pulmonary exam normal breath sounds clear to auscultation       Cardiovascular Exercise Tolerance: Good hypertension, +CHF  Normal cardiovascular exam Rhythm:Regular Rate:Normal  EKG 05/2020: Sinus tachycardia Ventricular premature complex Probable left atrial enlargement Borderline repolarization abnormality  ECHO 05/2020 1. Left ventricular ejection fraction, by estimation, is 65 to 70%. The  left ventricle has normal function. The left ventricle has no regional  wall motion abnormalities. There is severe left ventricular hypertrophy.  Indeterminate diastolic filling due  to E-A fusion.  2. Right ventricular systolic function is normal. The right ventricular  size is normal. There is normal pulmonary artery systolic pressure. The  estimated right ventricular systolic pressure is 68.0 mmHg.  3. The mitral valve is normal in structure. Trivial mitral valve  regurgitation. No evidence of mitral stenosis.  4. The aortic valve is normal in structure. Aortic valve regurgitation is  trivial. No aortic stenosis is present.  5. The inferior vena cava is normal in size with greater than 50%  respiratory variability, suggesting right atrial pressure of 3 mmHg.  6. Increased flow velocities may be secondary to anemia, thyrotoxicosis,  hyperdynamic or high flow state.    Neuro/Psych  Headaches, Seizures -,  Anxiety  Neuromuscular disease CVA, Residual Symptoms    GI/Hepatic Neg liver ROS,   Endo/Other  negative endocrine ROS  Renal/GU negative Renal ROS  negative genitourinary   Musculoskeletal   Abdominal   Peds negative  pediatric ROS (+)  Hematology  (+) anemia ,   Anesthesia Other Findings Left side weakness/drooping  Reproductive/Obstetrics                           Anesthesia Physical Anesthesia Plan  ASA: 3  Anesthesia Plan: MAC   Post-op Pain Management:    Induction: Intravenous  PONV Risk Score and Plan: 1 and TIVA, Treatment may vary due to age or medical condition and Propofol infusion  Airway Management Planned: Natural Airway and Simple Face Mask  Additional Equipment: None  Intra-op Plan:   Post-operative Plan:   Informed Consent: I have reviewed the patients History and Physical, chart, labs and discussed the procedure including the risks, benefits and alternatives for the proposed anesthesia with the patient or authorized representative who has indicated his/her understanding and acceptance.     Dental advisory given  Plan Discussed with: Anesthesiologist and CRNA  Anesthesia Plan Comments:        Anesthesia Quick Evaluation

## 2020-12-09 NOTE — Transfer of Care (Signed)
Immediate Anesthesia Transfer of Care Note  Patient: [name]  Procedure(s) Performed: COLONOSCOPY WITH PROPOFOL  Patient Location: Endoscopy Unit  Anesthesia Type:MAC  Level of Consciousness: awake, alert , oriented and patient cooperative  Airway & Oxygen Therapy: Patient Spontanous Breathing  Post-op Assessment: Report given to RN and Post -op Vital signs reviewed and stable  Post vital signs: Reviewed and stable  Last Vitals:  Vitals Value Taken Time  BP 140/77 0856  Temp    Pulse 77   Resp    SpO2 100%     Last Pain:  Vitals:   12/09/20 0749  TempSrc: Oral  PainSc: 0-No pain         Complications: No notable events documented.

## 2020-12-09 NOTE — Anesthesia Postprocedure Evaluation (Signed)
Anesthesia Post Note  Patient: [name]  Procedure(s) Performed: COLONOSCOPY WITH PROPOFOL     Patient location during evaluation: PACU Anesthesia Type: MAC Level of consciousness: awake and alert Pain management: pain level controlled Vital Signs Assessment: post-procedure vital signs reviewed and stable Respiratory status: spontaneous breathing and respiratory function stable Cardiovascular status: stable Postop Assessment: no apparent nausea or vomiting Anesthetic complications: no   No notable events documented.  Last Vitals:  Vitals:   12/09/20 0905 12/09/20 0906  BP: (!) 141/77 (!) 160/78  Pulse: 90 70  Resp: 15 17  Temp:    SpO2: 100% 100%    Last Pain:  Vitals:   12/09/20 0905  TempSrc:   PainSc: 0-No pain                 Merlinda Frederick

## 2020-12-12 ENCOUNTER — Encounter (HOSPITAL_COMMUNITY): Payer: Self-pay | Admitting: Gastroenterology

## 2020-12-17 NOTE — Progress Notes (Deleted)
Patient ID: [name], male    DOB: January 02, 1966  MRN: [id number]  CC: Hypertension Follow-Up   Subjective: [name] is a 55 y.o. male who presents for hypertension follow-up.   His concerns today include:   FOLLOWED BY NEURO FOR POST-STROKE FOLLOWED BY [facility]RO FOR URINARY ISSUES  HYPERTENSION FOLLOW-UP: 07/30/2020: - Continue Amlodipine, Clonidine, Labetalol, and Losartan as prescribed.  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   ICH (intracerebral hemorrhage) ([place]) 06/09/2020   Sprain of anterior talofibular ligament of left ankle 01/15/2020   Hyperlipidemia, on Lipitor 04/01/2017   Alcohol use disorder, mild, abuse 06/30/2016   Morbid obesity ([place]) 06/30/2016   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 90 tablet 0   atorvastatin (LIPITOR) 40 MG tablet Take 40 mg by mouth at bedtime.     Blood Pressure KIT 1 application by Does not apply route 2 (two) times daily. 1 kit 0   cloNIDine (CATAPRES) 0.1 MG tablet Take 1 tablet (0.1 mg total) by mouth 3 (three) times daily. 90 tablet 11   diclofenac Sodium (VOLTAREN) 1 % GEL Apply 2 g topically 4 (four) times daily. (Patient taking differently: Apply 2 g topically 4 (four) times daily as needed (pain).) 200 g 0   DULoxetine (CYMBALTA) 20 MG capsule Take 2 capsules (40 mg total) by mouth daily. 60  capsule 2   folic acid (FOLVITE) 1 MG tablet Take 1 tablet (1 mg total) by mouth daily. 90 tablet 3   gabapentin (NEURONTIN) 100 MG capsule Take 200 mg by mouth 3 (three) times daily.     gabapentin (NEURONTIN) 300 MG capsule Take 1 capsule (300 mg total) by mouth 3 (three) times daily. (Patient not taking: Reported on 12/07/2020) 90 capsule 3   hydrocerin (EUCERIN) CREA Apply 1 application topically 2 (two) times daily. (Patient not taking: Reported on 12/07/2020) 228 g 0   HYDROcodone-acetaminophen (NORCO/VICODIN) 5-325 MG tablet Take 1 tablet by mouth at bedtime as needed for severe pain. 30 tablet 0   labetalol (NORMODYNE) 100 MG tablet Take 1 tablet (100 mg total) by mouth 2 (two) times daily. 180 tablet 0   levETIRAcetam (KEPPRA) 500 MG tablet Take 1 tablet (500 mg total) by mouth 2 (two) times daily. 180 tablet 3   lidocaine (LIDODERM) 5 % PLACE 2 PATCHES TO THE SKIN ON BACK DAILY AT 8AM AND REMOVE AT 8PM. HAS TO BE OFF 12 HOURS. (Patient taking differently: Place 2 patches onto the skin daily as needed (pain).) 60 patch 3   losartan (COZAAR) 50 MG tablet Take 1 tablet (50 mg total) by mouth 2 (two) times daily. 180 tablet 0   Multiple Vitamins-Minerals (CERTAVITE/ANTIOXIDANTS) TABS Take 1 tablet by mouth daily. 30 tablet 0   naphazoline-pheniramine (NAPHCON-A) 0.025-0.3 % ophthalmic solution Place 1 drop into the right eye 4 (  four) times daily as needed for eye irritation. 15 mL 0   pantoprazole (PROTONIX) 40 MG tablet Take 1 tablet (40 mg total) by mouth daily. (Patient not taking: Reported on 12/07/2020) 30 tablet 0   polyvinyl alcohol (LIQUIFILM TEARS) 1.4 % ophthalmic solution Place 1 drop into both eyes every 6 (six) hours as needed for dry eyes. 15 mL 0   senna-docusate (SENOKOT-S) 8.6-50 MG tablet Take 2 tablets by mouth daily with supper. (Patient taking differently: Take 2 tablets by mouth daily as needed for moderate constipation.) 60 tablet 0   tamsulosin (FLOMAX) 0.4 MG CAPS  capsule Take 1 capsule (0.4 mg total) by mouth daily after supper. 30 capsule 3   thiamine 100 MG tablet Take 1 tablet (100 mg total) by mouth daily. 30 tablet 0   tiZANidine (ZANAFLEX) 2 MG tablet Take 1 tablet (2 mg total) by mouth 3 (three) times daily. (Patient taking differently: Take 2 mg by mouth every 8 (eight) hours as needed for muscle spasms.) 90 tablet 2   topiramate (TOPAMAX) 25 MG tablet Take 2 tablets (50 mg total) by mouth daily. 180 tablet 0   triamcinolone cream (KENALOG) 0.1 % Apply 1 application topically 2 (two) times daily. (Patient not taking: No sig reported) 80 g 0   No current facility-administered medications on file prior to visit.    No Known Allergies  Social History   Socioeconomic History   Marital status: Married    Spouse name: Not on file   Number of children: Not on file   Years of education: Not on file   Highest education level: Not on file  Occupational History   Not on file  Tobacco Use   Smoking status: Never   Smokeless tobacco: Never  Vaping Use   Vaping Use: Never used  Substance and Sexual Activity   Alcohol use: Not Currently   Drug use: No   Sexual activity: Yes  Other Topics Concern   Not on file  Social History Narrative   Not on file   Social Determinants of Health   Financial Resource Strain: Not on file  Food Insecurity: Not on file  Transportation Needs: Not on file  Physical Activity: Not on file  Stress: Not on file  Social Connections: Not on file  Intimate Partner Violence: Not on file    Family History  Problem Relation Age of Onset   Hypertension Mother        Passed away when she is 62 secondary to hip surgery complications   CAD Mother    Heart attack Mother    Hypertension Father    Diabetes Brother    Stroke Brother    Heart attack Maternal Grandmother    Colon cancer Neg Hx    Esophageal cancer Neg Hx    Pancreatic cancer Neg Hx    Stomach cancer Neg Hx    Colon polyps Neg Hx     Past  Surgical History:  Procedure Laterality Date   COLONOSCOPY WITH PROPOFOL N/A 12/09/2020   Procedure: COLONOSCOPY WITH PROPOFOL;  Surgeon: Yetta Flock, MD;  Location: [facility];  Service: Gastroenterology;  Laterality: N/A;   KNEE ARTHROSCOPY Left 04/13/2016   Procedure: ARTHROSCOPY KNEE WITH DEBRIDEMENT;  Surgeon: Garald Balding, MD;  Location: [facility];  Service: Orthopedics;  Laterality: Left;   KNEE ARTHROSCOPY WITH MEDIAL MENISECTOMY Right 04/13/2016   Procedure: KNEE ARTHROSCOPY WITH MEDIAL MENISECTOMY;  Surgeon: Garald Balding, MD;  Location: [facility];  Service: Orthopedics;  Laterality: LEFT not right knee   NASAL FRACTURE SURGERY     SHOULDER SURGERY Left     ROS: Review of Systems Negative except as stated above  PHYSICAL EXAM: There were no vitals taken for this visit.  Physical Exam  {male adult master:310786} {male adult master:310785}  CMP Latest Ref Rng & Units 09/29/2020 07/19/2020 07/12/2020  Glucose 65 - 99 mg/dL 88 109(H) 97  BUN 6 - 24 mg/dL '14 14 16  ' Creatinine 0.76 - 1.27 mg/dL 1.05 1.12 1.24  Sodium 134 - 144 mmol/L 143 138 140  Potassium 3.5 - 5.2 mmol/L 3.9 3.8 3.9  Chloride 96 - 106 mmol/L 107(H) 108 105  CO2 20 - 29 mmol/L '20 22 26  ' Calcium 8.7 - 10.2 mg/dL 10.3(H) 9.4 9.7  Total Protein 6.0 - 8.5 g/dL 7.7 - -  Total Bilirubin 0.0 - 1.2 mg/dL 0.4 - -  Alkaline Phos 44 - 121 IU/L 105 - -  AST 0 - 40 IU/L 18 - -  ALT 0 - 44 IU/L 22 - -   Lipid Panel     Component Value Date/Time   CHOL 154 09/29/2020 1645   TRIG 126 09/29/2020 1645   HDL 45 09/29/2020 1645   CHOLHDL 3.4 09/29/2020 1645   CHOLHDL 3.8 06/09/2020 1809   VLDL 66 (H) 06/09/2020 1809   LDLCALC 87 09/29/2020 1645    CBC    Component Value Date/Time   WBC 5.7 09/29/2020 1645   WBC 6.0 07/19/2020 0530   RBC 4.39 09/29/2020 1645   RBC 3.61 (L) 07/19/2020 0530   HGB 13.4 09/29/2020 1645   HCT 38.0 09/29/2020 1645   PLT 326  09/29/2020 1645   MCV 87 09/29/2020 1645   MCH 30.5 09/29/2020 1645   MCH 31.6 07/19/2020 0530   MCHC 35.3 09/29/2020 1645   MCHC 33.4 07/19/2020 0530   RDW 11.6 09/29/2020 1645   LYMPHSABS 1.8 06/28/2020 1103   MONOABS 0.8 06/28/2020 1103   EOSABS 0.1 06/28/2020 1103   BASOSABS 0.0 06/28/2020 1103    ASSESSMENT AND PLAN:  There are no diagnoses linked to this encounter.   Patient was given the opportunity to ask questions.  Patient verbalized understanding of the plan and was able to repeat key elements of the plan. Patient was given clear instructions to go to Emergency Department or return to medical center if symptoms don't improve, worsen, or new problems develop.The patient verbalized understanding.   No orders of the defined types were placed in this encounter.    Requested Prescriptions    No prescriptions requested or ordered in this encounter    No follow-ups on file.  Camillia Herter, NP

## 2020-12-19 DIAGNOSIS — R569 Unspecified convulsions: Secondary | ICD-10-CM | POA: Diagnosis not present

## 2020-12-19 DIAGNOSIS — I610 Nontraumatic intracerebral hemorrhage in hemisphere, subcortical: Secondary | ICD-10-CM | POA: Diagnosis not present

## 2020-12-19 DIAGNOSIS — I5042 Chronic combined systolic (congestive) and diastolic (congestive) heart failure: Secondary | ICD-10-CM | POA: Diagnosis not present

## 2020-12-22 ENCOUNTER — Ambulatory Visit: Payer: 59 | Admitting: Family

## 2020-12-22 ENCOUNTER — Other Ambulatory Visit (HOSPITAL_COMMUNITY): Payer: Self-pay

## 2020-12-22 DIAGNOSIS — I10 Essential (primary) hypertension: Secondary | ICD-10-CM

## 2020-12-24 ENCOUNTER — Encounter: Payer: BC Managed Care – PPO | Admitting: Physical Medicine and Rehabilitation

## 2020-12-27 DIAGNOSIS — I5042 Chronic combined systolic (congestive) and diastolic (congestive) heart failure: Secondary | ICD-10-CM | POA: Diagnosis not present

## 2020-12-27 DIAGNOSIS — R569 Unspecified convulsions: Secondary | ICD-10-CM | POA: Diagnosis not present

## 2020-12-27 DIAGNOSIS — I610 Nontraumatic intracerebral hemorrhage in hemisphere, subcortical: Secondary | ICD-10-CM | POA: Diagnosis not present

## 2020-12-29 DIAGNOSIS — I5042 Chronic combined systolic (congestive) and diastolic (congestive) heart failure: Secondary | ICD-10-CM | POA: Diagnosis not present

## 2020-12-29 DIAGNOSIS — R569 Unspecified convulsions: Secondary | ICD-10-CM | POA: Diagnosis not present

## 2020-12-29 DIAGNOSIS — I610 Nontraumatic intracerebral hemorrhage in hemisphere, subcortical: Secondary | ICD-10-CM | POA: Diagnosis not present

## 2020-12-30 ENCOUNTER — Ambulatory Visit: Payer: BC Managed Care – PPO | Admitting: Physical Therapy

## 2020-12-30 ENCOUNTER — Ambulatory Visit: Payer: BC Managed Care – PPO | Admitting: Occupational Therapy

## 2021-01-06 ENCOUNTER — Encounter: Payer: Self-pay | Admitting: Physical Therapy

## 2021-01-06 ENCOUNTER — Ambulatory Visit: Payer: BC Managed Care – PPO | Attending: Family | Admitting: Occupational Therapy

## 2021-01-06 ENCOUNTER — Ambulatory Visit: Payer: BC Managed Care – PPO | Admitting: Physical Therapy

## 2021-01-06 ENCOUNTER — Other Ambulatory Visit: Payer: Self-pay

## 2021-01-06 ENCOUNTER — Encounter: Payer: Self-pay | Admitting: Occupational Therapy

## 2021-01-06 VITALS — BP 167/90 | HR 77

## 2021-01-06 DIAGNOSIS — R2681 Unsteadiness on feet: Secondary | ICD-10-CM | POA: Diagnosis not present

## 2021-01-06 DIAGNOSIS — I69154 Hemiplegia and hemiparesis following nontraumatic intracerebral hemorrhage affecting left non-dominant side: Secondary | ICD-10-CM | POA: Insufficient documentation

## 2021-01-06 DIAGNOSIS — M25512 Pain in left shoulder: Secondary | ICD-10-CM | POA: Insufficient documentation

## 2021-01-06 DIAGNOSIS — R2689 Other abnormalities of gait and mobility: Secondary | ICD-10-CM | POA: Diagnosis not present

## 2021-01-06 DIAGNOSIS — R41842 Visuospatial deficit: Secondary | ICD-10-CM | POA: Insufficient documentation

## 2021-01-06 DIAGNOSIS — M6281 Muscle weakness (generalized): Secondary | ICD-10-CM

## 2021-01-06 DIAGNOSIS — R278 Other lack of coordination: Secondary | ICD-10-CM | POA: Diagnosis not present

## 2021-01-06 DIAGNOSIS — R208 Other disturbances of skin sensation: Secondary | ICD-10-CM | POA: Diagnosis not present

## 2021-01-06 DIAGNOSIS — G8929 Other chronic pain: Secondary | ICD-10-CM | POA: Diagnosis not present

## 2021-01-06 DIAGNOSIS — I69354 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-dominant side: Secondary | ICD-10-CM | POA: Diagnosis not present

## 2021-01-06 NOTE — Patient Instructions (Signed)
SELF ASSISTED WITH OBJECT:  ? ?Shoulder Flexion - Supine ? ? ? ?Hold cane with both hands. Raise arms overhead, keep elbows straight. 10_ reps per set, 2-3 sets per day, _5_ days per week ? ?Cane Horizontal - Supine ? ? ? ?With straight arms holding cane above shoulders, bring cane out to right, center, out to left, and back to above head. ?Repeat 10_ times. Do 2-3_ times per day ?Copyright ? VHI. All rights reserved.  ? ?Supine: Chest Press (Active) ? ? ? ?Lie on back with arms fully extended. Lower bar or dowel slowly to chest and press to arm's length. Use cane ?Complete 3 sets of _10 repetitions.  ? ?Shoulder: Abduction (Supine) ? ? ? ?With right arm flat on floor, hold dowel in palm. ?Slowly move arm up to side of head by pushing with opposite arm. Do not let elbow bend. ?Hold _10_ seconds. Repeat  10_ times. Do 3_ sessions per day. ?CAUTION: Stretch slowly and gently. ? ?Copyright ? VHI. All rights reserved.   ?

## 2021-01-06 NOTE — Patient Instructions (Signed)
Access Code: [email or web address] URL: [email or web address] Date: 01/06/2021 Prepared by: Sallyanne Kuster  Exercises Sit to/from Stand in Stride position - 1 x daily - 5 x weekly - 1 sets - 10 reps Standing Single Leg Stance with Counter Support - 1 x daily - 5 x weekly - 1 sets - 3 reps - 10 hold

## 2021-01-06 NOTE — Therapy (Signed)
[facility] [address] [place], [place], [address] Phone: [phone]   Fax:  [phone]  Occupational Therapy Treatment  Patient Details  Name: [name] MRN: [id number] Date of Birth: 07-Nov-1965 Referring Provider (OT): Sula Soda, MD   Encounter Date: 01/06/2021   OT End of Session - 01/06/21 1613     Visit Number 2    Number of Visits 13    Date for OT Re-Evaluation 02/01/21   8 week plan of care   Authorization Type BCBS    Authorization Time Period 1st 5 visits are at no charge, VL: PT/OT combined 30    Authorization - Number of Visits 15    OT Start Time 1614    OT Stop Time 1700    OT Time Calculation (min) 46 min    Activity Tolerance Patient tolerated treatment well    Behavior During Therapy [facility] for tasks assessed/performed             Past Medical History:  Diagnosis Date   Anxiety    Cataract    removed left eye   Chronic combined systolic and diastolic CHF (congestive heart failure) (HCC)    EF 25-30%   Diastolic dysfunction    Dyslipidemia    Hyperlipidemia    Hypertension    Neuromuscular disorder (HCC)    neuropathy left foot   Nonischemic dilated cardiomyopathy (HCC)    felt secondary to HTN with no ischemia on nuclear stress test 05/2012, EF 25-30%   Seizures (HCC)    last seizure 05-2020- on Keppra   Shortness of breath    Stroke (HCC) 05/2020    Past Surgical History:  Procedure Laterality Date   COLONOSCOPY WITH PROPOFOL N/A 12/09/2020   Procedure: COLONOSCOPY WITH PROPOFOL;  Surgeon: Benancio Deeds, MD;  Location: [facility];  Service: Gastroenterology;  Laterality: N/A;   KNEE ARTHROSCOPY Left 04/13/2016   Procedure: ARTHROSCOPY KNEE WITH DEBRIDEMENT;  Surgeon: Valeria Batman, MD;  Location: [facility];  Service: Orthopedics;  Laterality: Left;   KNEE ARTHROSCOPY WITH MEDIAL MENISECTOMY Right 04/13/2016   Procedure: KNEE ARTHROSCOPY  WITH MEDIAL MENISECTOMY;  Surgeon: Valeria Batman, MD;  Location: [facility];  Service: Orthopedics;  Laterality: LEFT not right knee   NASAL FRACTURE SURGERY     SHOULDER SURGERY Left     There were no vitals filed for this visit.   Subjective Assessment - 01/06/21 1613     Subjective  "just trying to get things back together here" - "i can't locate my arm"    Patient is accompanied by: Family member   spouse   Pertinent History hypertension, obesity, cardiomyopathy, hyperlipidemia, CHF    Limitations Fall Risk. Sensory Deficits LUE    Patient Stated Goals driving, cooking, walking, bathing himself    Currently in Pain? No/denies    Pain Score 0-No pain    Pain Onset --             Supine HEP see pt instructions  UE Ranger with LUE in sitting with forward reaching and good control with scap protraction and retraction   Rolling onto L side with body weight with no pain reported. Pt very fearful and guarding shoulder and tensing shoulder and neck.  Grasp and Release with 1 inch blocks with LUE with mod difficulty and ataxia noted.  OT Education - 01/06/21 1624     Education Details supine Foam Roller HEP    Person(s) Educated Patient    Methods Explanation;Demonstration;Handout    Comprehension Verbalized understanding;Returned demonstration              OT Short Term Goals - 01/06/21 1644       OT SHORT TERM GOAL #1   Title Pt will be independent with HEP targeting LUE range of motion and coordination    Time 4    Period Weeks    Status On-going    Target Date 01/05/21      OT SHORT TERM GOAL #2   Title Pt will increase box and blocks score with LUE to 10 blocks or greater for increasing functional use of LUE.    Baseline R 40, L 7    Time 4    Period Weeks    Status New      OT SHORT TERM GOAL #3   Title Pt will verbalize understanding of sensory safety strategies for increasing independence and  safety    Time 4    Period Weeks    Status New      OT SHORT TERM GOAL #4   Title Pt will perform simple warm meal prep and/or light housekeeping with supervision and incresed safety awareness d/t sensory deficits.    Time 4    Period Weeks    Status New               OT Long Term Goals - 12/08/20 1128       OT LONG TERM GOAL #1   Title Pt will be independent with any updated HEPs    Time 8    Period Weeks    Status New      OT LONG TERM GOAL #2   Title Pt will perform bathing with mod I and good safety awareness consistently, per report.    Time 8    Period Weeks    Status New      OT LONG TERM GOAL #3   Title Pt will assist with at least one home management and/or meal prep task per day in order to increase independence and decrease caregiver burden.    Time 8    Period Weeks    Status New      OT LONG TERM GOAL #4   Title Pt will obtain a lightweight object from a low to mid level surface with LUE in order to increase ability to use LUE for functional reaching and use.    Time 8    Period Weeks    Status New                   Plan - 01/06/21 1618     Clinical Impression Statement Pt verbalized understanding of goals and agreement.    OT Occupational Profile and History Problem Focused Assessment - Including review of records relating to presenting problem    Occupational performance deficits (Please refer to evaluation for details): ADL's;IADL's;Leisure    Body Structure / Function / Physical Skills ADL;GMC;FMC;Flexibility;Coordination;Sensation;IADL;ROM;UE functional use;Proprioception;Pain;Strength;Decreased knowledge of use of DME    Rehab Potential Good    Clinical Decision Making Limited treatment options, no task modification necessary    Comorbidities Affecting Occupational Performance: None    Modification or Assistance to Complete Evaluation  No modification of tasks or assist necessary to complete eval    OT Frequency 2x / week    OT  Duration 8 weeks   12 visits over 8 weeks - starting with 1x/week until determine visit limit with insurance to use visits efficiently.   OT Treatment/Interventions Self-care/ADL training;Aquatic Therapy;Patient/family education;Manual Therapy;Electrical Stimulation;Neuromuscular education;Functional Mobility Training;Passive range of motion;Visual/perceptual remediation/compensation;Therapeutic exercise;Ultrasound;Moist Heat;Fluidtherapy;DME and/or AE instruction;Therapeutic activities;Splinting    Plan supine shoulder HEP, LUE grasp/release and NMR, sensory strategies for safety    Consulted and Agree with Plan of Care Patient;Family member/caregiver    Family Member Consulted spouse             Patient will benefit from skilled therapeutic intervention in order to improve the following deficits and impairments:   Body Structure / Function / Physical Skills: ADL, GMC, FMC, Flexibility, Coordination, Sensation, IADL, ROM, UE functional use, Proprioception, Pain, Strength, Decreased knowledge of use of DME       Visit Diagnosis: Muscle weakness (generalized)  Other lack of coordination  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-dominant side (HCC)  Chronic left shoulder pain  Visuospatial deficit  Other disturbances of skin sensation  Hemiplegia and hemiparesis following nontraumatic intracerebral hemorrhage affecting left non-dominant side (HCC)  Other abnormalities of gait and mobility  Unsteadiness on feet    Problem List Patient Active Problem List   Diagnosis Date Noted   Colon cancer screening    Left hemiplegia (HCC) 07/02/2020   Cognitive deficit due to recent stroke 07/02/2020   Dysphagia, post-stroke    Vascular headache    Acute blood loss anemia    AKI (acute kidney injury) (HCC)    Essential hypertension    Seizures (HCC)    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   ICH (intracerebral hemorrhage) (HCC) 06/09/2020    Sprain of anterior talofibular ligament of left ankle 01/15/2020   Hyperlipidemia, on Lipitor 04/01/2017   Alcohol use disorder, mild, abuse 06/30/2016   Morbid obesity (HCC) 06/30/2016   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   Chronic combined systolic and diastolic CHF (congestive heart failure) (HCC) 11/08/2012   HTN (hypertension) 05/25/2012    Junious Dresser, OT 01/06/2021, 4:58 PM  [facility] [address] [place], [place], [address] Phone: [phone]   Fax:  [phone]  Name: [name] MRN: [id number] Date of Birth: 03/27/1965

## 2021-01-07 NOTE — Therapy (Addendum)
[facility] [address] [place], [place], [address] Phone: [phone]   Fax:  [phone]  Physical Therapy Treatment  Patient Details  Name: [name] MRN: [id number] Date of Birth: 27-Sep-1965 Referring Provider (PT): Leeroy Cha, MD   Encounter Date: 01/06/2021   PT End of Session - 01/06/21 1539     Visit Number 2    Number of Visits 15   1st 5 visits are at no charge; 30 PT/OT combined   Date for PT Re-Evaluation 02/04/21    Authorization Type BCBS. 30 visits PT/OT combined, each dicipline counts as 1 visit    PT Start Time 1535    PT Stop Time 81    PT Time Calculation (min) 40 min    Equipment Utilized During Treatment Gait belt    Activity Tolerance Patient tolerated treatment well    Behavior During Therapy WFL for tasks assessed/performed             Past Medical History:  Diagnosis Date   Anxiety    Cataract    removed left eye   Chronic combined systolic and diastolic CHF (congestive heart failure) (HCC)    EF 94-76%   Diastolic dysfunction    Dyslipidemia    Hyperlipidemia    Hypertension    Neuromuscular disorder (HCC)    neuropathy left foot   Nonischemic dilated cardiomyopathy ([place])    felt secondary to HTN with no ischemia on nuclear stress test 05/2012, EF 25-30%   Seizures ([place])    last seizure 05-2020- on Keppra   Shortness of breath    Stroke ([place]) 05/2020    Past Surgical History:  Procedure Laterality Date   COLONOSCOPY WITH PROPOFOL N/A 12/09/2020   Procedure: COLONOSCOPY WITH PROPOFOL;  Surgeon: Yetta Flock, MD;  Location[facility];  Service: Gastroenterology;  Laterality: N/A;   KNEE ARTHROSCOPY Left 04/13/2016   Procedure: ARTHROSCOPY KNEE WITH DEBRIDEMENT;  Surgeon: Garald Balding, MD;  Location: [facility];  Service: Orthopedics;  Laterality: Left;   KNEE ARTHROSCOPY WITH MEDIAL MENISECTOMY Right 04/13/2016   Procedure: KNEE  ARTHROSCOPY WITH MEDIAL MENISECTOMY;  Surgeon: Garald Balding, MD;  Location: [facility];  Service: Orthopedics;  Laterality: LEFT not right knee   NASAL FRACTURE SURGERY     SHOULDER SURGERY Left     Vitals:   01/06/21 1547  BP: (!) 167/90  Pulse: 77     Subjective Assessment - 01/06/21 1537     Subjective No new complaints. Has been walking laps around 3x a day. No falls. Been having    Pertinent History HTN, cardiomyopathy, chronic combined systolic & diastolic CHF, alcohol use disorder, seizures                [facility] PT Assessment - 01/06/21 1543       Standardized Balance Assessment   Standardized Balance Assessment Berg Balance Test      Berg Balance Test   Sit to Stand Able to stand without using hands and stabilize independently    Standing Unsupported Able to stand 2 minutes with supervision   with most weight on right side   Sitting with Back Unsupported but Feet Supported on Floor or Stool Able to sit safely and securely 2 minutes    Stand to Sit Sits safely with minimal use of hands    Transfers Able to transfer safely, definite need of hands    Standing Unsupported with Eyes Closed Able to  stand 10 seconds with supervision    Standing Unsupported with Feet Together Able to place feet together independently and stand for 1 minute with supervision    From Standing, Reach Forward with Outstretched Arm Can reach forward >12 cm safely (5")   8 inches   From Standing Position, Pick up Object from [place] to pick up shoe, needs supervision    From Standing Position, Turn to Look Behind Over each Shoulder Looks behind one side only/other side shows less weight shift   left>right   Turn 360 Degrees Able to turn 360 degrees safely but slowly   > 6 seconds   Standing Unsupported, Alternately Place Feet on Step/Stool Able to complete >2 steps/needs minimal assist    Standing Unsupported, One Foot in [place] to take small step independently and hold 30  seconds    Standing on One Leg Able to lift leg independently and hold equal to or more than 3 seconds    Total Score 40    Berg comment: 37-45 significant risk                    [facility] Adult PT Treatment/Exercise - 01/06/21 1543       Transfers   Transfers Sit to Stand;Stand to Sit    Sit to Stand 5: Supervision;With upper extremity assist;From bed;From chair/3-in-1    Stand to Sit 5: Supervision;4: Min guard;With upper extremity assist;To bed;To chair/3-in-1      Ambulation/Gait   Ambulation/Gait Yes    Ambulation/Gait Assistance 4: Min guard    Ambulation Distance (Feet) 100 feet   x1, plus around clinic with session   Assistive device Hemi-walker    Gait Pattern Decreased step length - left;Decreased arm swing - left;Decreased stance time - left;Decreased hip/knee flexion - left;Decreased dorsiflexion - left;Decreased weight shift to left    Ambulation Surface Level;Indoor      Exercises   Exercises Other Exercises issued HEP for strengthening. Refer to [place] for full details. Cues on correct from needed.            Issued to HEP today: Access Code: [email or web address] URL: [email or web address] Date: 01/06/2021 Prepared by: Willow Ora  Exercises Sit to/from Stand in Stride position - 1 x daily - 5 x weekly - 1 sets - 10 reps Standing Single Leg Stance with Counter Support - 1 x daily - 5 x weekly - 1 sets - 3 reps - 10 hold     01/06/21 1653  PT Education  Education Details results of Berg Balance test; initial HEP  Person(s) Educated Patient  Methods Explanation;Demonstration;Verbal cues;Handout  Comprehension Verbalized understanding;Returned demonstration;Verbal cues required;Need further instruction      PT Short Term Goals - 01/06/21 1654       PT SHORT TERM GOAL #1   Title Pt will improve Berg balance test score by at least 4 points to demo improvement in balance.    Baseline 01/06/21: initial score set this date is 40/56    Status  Unable to assess      PT SHORT TERM GOAL #2   Title Improve TUG score from 38.87 secs to </= 33 secs with use of hemiwalker to demo improvement in functional mobility.    Baseline 01/06/21: unable to reassess due to time contraints, 38.87 sec's at baseline    Status Unable to assess      PT SHORT TERM GOAL #3   Title Amb. with SBQC 230' with CGA on flat, even surface for increased  community accessibility.    Baseline 01/06/21: 100 feet with hemiwalker, improved just not to goal    Status Partially Met    Target Date 01/07/21      PT SHORT TERM GOAL #4   Title Independent in HEP for LLE strengthening.    Baseline 01/06/21: initial HEP initiated today    Status Partially Met                     PT Long Term Goals - 12/08/20 1654       PT LONG TERM GOAL #1   Title Improve Berg balance test score by at least 8 points for reduced fall risk.    Time 8    Period Weeks    Status New    Target Date 02/04/21      PT LONG TERM GOAL #2   Title Increase gait velocity from .68 ft/sec to >/= 1.4 ft/sec with hemiwalker for increased gait efficiency.    Baseline 47.78 secs = .68 ft/sec    Time 8    Period Weeks    Status New    Target Date 02/04/21      PT LONG TERM GOAL #3   Title Improve TUG score to </= 30 secs with hemiwalker to reduce fall risk and to demo incr. functional mobility.    Baseline 38.87 secs with hemiwalker    Time 8    Period Weeks    Status New    Target Date 02/04/21      PT LONG TERM GOAL #4   Title Amb. 30' without use of assistive device with SBA for household amb.    Baseline hemiwalker - 54' with SBA to CGA    Time 8    Period Weeks    Status New    Target Date 02/04/21      PT LONG TERM GOAL #5   Title Amb. 500' with SPC with SBA for incr. community accessibility.    Time 8    Period Weeks    Status New    Target Date 02/04/21      Additional Long Term Goals   Additional Long Term Goals Yes      PT LONG TERM GOAL #6   Title Perform  floor to stand transfer with UE support on object with SBA.    Time 8    Period Weeks    Status New    Target Date 02/04/21               01/06/21 1541  Plan  Clinical Impression Statement Today's skilled session focused on progress toward STGs. Pt returns to PT after missing several weeks due to transportation issues. Set baseline score for Berg Balance test this session at 40/56. Also issued a few ex's to HEP, will add more next session. The pt is progressing and should benefit from continued PT to progress toward unmet goals.  Personal Factors and Comorbidities Behavior Pattern;Comorbidity 2;Profession;Transportation  Comorbidities cardiomyopathy, systolic & diastolic CHF, ICH, seizures  Examination-Activity Limitations Bend;Carry;Lift;Stand;Stairs;Squat;Locomotion Level;Transfers;Sleep;Bed Mobility  Examination-Participation Restrictions Cleaning;Community Activity;Driving;Shop;Laundry;Occupation;Meal Prep;Yard Work  Pt will benefit from skilled therapeutic intervention in order to improve on the following deficits Abnormal gait;Decreased activity tolerance;Decreased balance;Decreased coordination;Decreased strength;Impaired tone;Impaired sensation;Impaired UE functional use;Impaired vision/preception;Pain  Stability/Clinical Decision Making Evolving/Moderate complexity  Rehab Potential Good  PT Frequency 2x / week  PT Duration 8 weeks  PT Treatment/Interventions ADLs/Self Care Home Management;Aquatic Therapy;DME Instruction;Gait training;Stair training;Therapeutic activities;Therapeutic exercise;Balance training;Orthotic Fit/Training;Patient/family education;Neuromuscular re-education  PT Next Visit Plan added additional ex's to HEP; continue to work on strengthening and balance. trial AFO on left LE  PT Home Exercise Plan Access Code: [email or web address]  Consulted and Agree with Plan of Care Patient  Family Member Consulted           Patient will benefit from skilled therapeutic  intervention in order to improve the following deficits and impairments:  Abnormal gait, Decreased activity tolerance, Decreased balance, Decreased coordination, Decreased strength, Impaired tone, Impaired sensation, Impaired UE functional use, Impaired vision/preception, Pain  Visit Diagnosis: Muscle weakness (generalized)  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-dominant side (HCC)  Other abnormalities of gait and mobility  Unsteadiness on feet     Problem List Patient Active Problem List   Diagnosis Date Noted   Colon cancer screening    Left hemiplegia ([place]) 07/02/2020   Cognitive deficit due to recent stroke 07/02/2020   Dysphagia, post-stroke    Vascular headache    Acute blood loss anemia    AKI (acute kidney injury) ([place])    Essential hypertension    Seizures ([place])    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   ICH (intracerebral hemorrhage) ([place]) 06/09/2020   Sprain of anterior talofibular ligament of left ankle 01/15/2020   Hyperlipidemia, on Lipitor 04/01/2017   Alcohol use disorder, mild, abuse 06/30/2016   Morbid obesity ([place]) 06/30/2016   Nonischemic dilated cardiomyopathy ([place]) 11/08/2012   Chronic combined systolic and diastolic CHF (congestive heart failure) ([place]) 11/08/2012   HTN (hypertension) 05/25/2012    Willow Ora, PTA, [facility] [address], [place], [place] [address] [phone] 01/07/21, 11:24 AM   Name: [name] MRN: [id number] Date of Birth: 09-10-1965

## 2021-01-12 ENCOUNTER — Telehealth: Payer: Self-pay | Admitting: Family

## 2021-01-12 NOTE — Telephone Encounter (Signed)
Pt's wife asking for status update on Disability forms from [facility] states were faxed to [facility] and they need to be returned by the 26th. Thank you

## 2021-01-13 ENCOUNTER — Ambulatory Visit: Payer: BC Managed Care – PPO | Admitting: Occupational Therapy

## 2021-01-13 ENCOUNTER — Telehealth: Payer: Self-pay | Admitting: Family

## 2021-01-13 ENCOUNTER — Ambulatory Visit: Payer: BC Managed Care – PPO | Admitting: Adult Health

## 2021-01-13 ENCOUNTER — Ambulatory Visit: Payer: BC Managed Care – PPO | Admitting: Physical Therapy

## 2021-01-13 NOTE — Telephone Encounter (Signed)
Paperwork will need to be resent, there is no receipt of paperwork from [facility] here for the pt.

## 2021-01-13 NOTE — Telephone Encounter (Signed)
Update from  Paperwork will need to be resent, there is no receipt of paperwork from [facility] here for the pt.   Update: Paperwork from [facility] was faxed this morning and placed in Provider's bin.

## 2021-01-13 NOTE — Telephone Encounter (Signed)
Provider does not return to office till 12/27 after due date of paperwork. Please schedule appointment for paperwork to be completed.

## 2021-01-18 DIAGNOSIS — I5042 Chronic combined systolic (congestive) and diastolic (congestive) heart failure: Secondary | ICD-10-CM | POA: Diagnosis not present

## 2021-01-18 DIAGNOSIS — I610 Nontraumatic intracerebral hemorrhage in hemisphere, subcortical: Secondary | ICD-10-CM | POA: Diagnosis not present

## 2021-01-18 DIAGNOSIS — R569 Unspecified convulsions: Secondary | ICD-10-CM | POA: Diagnosis not present

## 2021-01-20 ENCOUNTER — Encounter: Payer: Self-pay | Admitting: Occupational Therapy

## 2021-01-20 ENCOUNTER — Ambulatory Visit: Payer: BC Managed Care – PPO | Admitting: Physical Therapy

## 2021-01-20 ENCOUNTER — Ambulatory Visit: Payer: BC Managed Care – PPO | Admitting: Occupational Therapy

## 2021-01-20 ENCOUNTER — Encounter: Payer: Self-pay | Admitting: Physical Therapy

## 2021-01-20 ENCOUNTER — Other Ambulatory Visit: Payer: Self-pay

## 2021-01-20 DIAGNOSIS — I69354 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-dominant side: Secondary | ICD-10-CM | POA: Diagnosis not present

## 2021-01-20 DIAGNOSIS — G8929 Other chronic pain: Secondary | ICD-10-CM | POA: Diagnosis not present

## 2021-01-20 DIAGNOSIS — R2681 Unsteadiness on feet: Secondary | ICD-10-CM

## 2021-01-20 DIAGNOSIS — M6281 Muscle weakness (generalized): Secondary | ICD-10-CM

## 2021-01-20 DIAGNOSIS — R41842 Visuospatial deficit: Secondary | ICD-10-CM | POA: Diagnosis not present

## 2021-01-20 DIAGNOSIS — M25512 Pain in left shoulder: Secondary | ICD-10-CM

## 2021-01-20 DIAGNOSIS — R208 Other disturbances of skin sensation: Secondary | ICD-10-CM | POA: Diagnosis not present

## 2021-01-20 DIAGNOSIS — R278 Other lack of coordination: Secondary | ICD-10-CM

## 2021-01-20 DIAGNOSIS — I69154 Hemiplegia and hemiparesis following nontraumatic intracerebral hemorrhage affecting left non-dominant side: Secondary | ICD-10-CM | POA: Diagnosis not present

## 2021-01-20 DIAGNOSIS — R2689 Other abnormalities of gait and mobility: Secondary | ICD-10-CM | POA: Diagnosis not present

## 2021-01-20 NOTE — Therapy (Signed)
[facility] [address] [place], [place], [address] Phone: [phone]   Fax:  [phone]  Occupational Therapy Treatment  Patient Details  Name: [name] MRN: [id number] Date of Birth: [birth year] Referring Provider (OT): Sula Soda, MD   Encounter Date: 01/20/2021   OT End of Session - 01/20/21 1622     Visit Number 3    Number of Visits 13    Date for OT Re-Evaluation 02/01/21   8 week plan of care   Authorization Type BCBS    Authorization Time Period 1st 5 visits are at no charge, VL: PT/OT combined 30    Authorization - Number of Visits 15    OT Start Time 1619    OT Stop Time 1700    OT Time Calculation (min) 41 min    Activity Tolerance Patient tolerated treatment well    Behavior During Therapy [facility] for tasks assessed/performed             Past Medical History:  Diagnosis Date   Anxiety    Cataract    removed left eye   Chronic combined systolic and diastolic CHF (congestive heart failure) (HCC)    EF 25-30%   Diastolic dysfunction    Dyslipidemia    Hyperlipidemia    Hypertension    Neuromuscular disorder (HCC)    neuropathy left foot   Nonischemic dilated cardiomyopathy (HCC)    felt secondary to HTN with no ischemia on nuclear stress test 05/2012, EF 25-30%   Seizures (HCC)    last seizure 05-2020- on Keppra   Shortness of breath    Stroke (HCC) 05/2020    Past Surgical History:  Procedure Laterality Date   COLONOSCOPY WITH PROPOFOL N/A 12/09/2020   Procedure: COLONOSCOPY WITH PROPOFOL;  Surgeon: Benancio Deeds, MD;  Location[facility];  Service: Gastroenterology;  Laterality: N/A;   KNEE ARTHROSCOPY Left 04/13/2016   Procedure: ARTHROSCOPY KNEE WITH DEBRIDEMENT;  Surgeon: Valeria Batman, MD;  Location: [facility];  Service: Orthopedics;  Laterality: Left;   KNEE ARTHROSCOPY WITH MEDIAL MENISECTOMY Right 04/13/2016   Procedure: KNEE ARTHROSCOPY  WITH MEDIAL MENISECTOMY;  Surgeon: Valeria Batman, MD;  Location: [facility];  Service: Orthopedics;  Laterality: LEFT not right knee   NASAL FRACTURE SURGERY     SHOULDER SURGERY Left     There were no vitals filed for this visit.   Subjective Assessment - 01/20/21 1625     Subjective  my leg is tingling i think it's waking up -    Pertinent History hypertension, obesity, cardiomyopathy, hyperlipidemia, CHF    Limitations Fall Risk. Sensory Deficits LUE    Patient Stated Goals driving, cooking, walking, bathing himself    Currently in Pain? No/denies    Pain Score 0-No pain              PVC frame in supine with chest press, shoulder flexion and horizontal abduction x 10 reps  Weight bearing on forearm LUE x 6 and pushing up with min A for maintaining hand position  Grasp/Release with 1 inch blocks with LUE And reaching low level to bowl anteriorly with min difficulty - upgraded to color dowel pegs with mod difficulty and ability to grasp and release the largest 2 sizes.                      OT Short Term Goals - 01/20/21 1626  OT SHORT TERM GOAL #1   Title Pt will be independent with HEP targeting LUE range of motion and coordination    Time 4    Period Weeks    Status On-going    Target Date 01/05/21      OT SHORT TERM GOAL #2   Title Pt will increase box and blocks score with LUE to 10 blocks or greater for increasing functional use of LUE.    Baseline R 40, L 7    Time 4    Period Weeks    Status New      OT SHORT TERM GOAL #3   Title Pt will verbalize understanding of sensory safety strategies for increasing independence and safety    Time 4    Period Weeks    Status New      OT SHORT TERM GOAL #4   Title Pt will perform simple warm meal prep and/or light housekeeping with supervision and incresed safety awareness d/t sensory deficits.    Time 4    Period Weeks    Status New               OT Long Term  Goals - 12/08/20 1128       OT LONG TERM GOAL #1   Title Pt will be independent with any updated HEPs    Time 8    Period Weeks    Status New      OT LONG TERM GOAL #2   Title Pt will perform bathing with mod I and good safety awareness consistently, per report.    Time 8    Period Weeks    Status New      OT LONG TERM GOAL #3   Title Pt will assist with at least one home management and/or meal prep task per day in order to increase independence and decrease caregiver burden.    Time 8    Period Weeks    Status New      OT LONG TERM GOAL #4   Title Pt will obtain a lightweight object from a low to mid level surface with LUE in order to increase ability to use LUE for functional reaching and use.    Time 8    Period Weeks    Status New                   Plan - 01/20/21 1636     Clinical Impression Statement Pt with increased coordination with LUE today. Pt with decreased pain.    OT Occupational Profile and History Problem Focused Assessment - Including review of records relating to presenting problem    Occupational performance deficits (Please refer to evaluation for details): ADL's;IADL's;Leisure    Body Structure / Function / Physical Skills ADL;GMC;FMC;Flexibility;Coordination;Sensation;IADL;ROM;UE functional use;Proprioception;Pain;Strength;Decreased knowledge of use of DME    Rehab Potential Good    Clinical Decision Making Limited treatment options, no task modification necessary    Comorbidities Affecting Occupational Performance: None    Modification or Assistance to Complete Evaluation  No modification of tasks or assist necessary to complete eval    OT Frequency 2x / week    OT Duration 8 weeks   12 visits over 8 weeks - starting with 1x/week until determine visit limit with insurance to use visits efficiently.   OT Treatment/Interventions Self-care/ADL training;Aquatic Therapy;Patient/family education;Manual Therapy;Electrical Stimulation;Neuromuscular  education;Functional Mobility Training;Passive range of motion;Visual/perceptual remediation/compensation;Therapeutic exercise;Ultrasound;Moist Heat;Fluidtherapy;DME and/or AE instruction;Therapeutic activities;Splinting    Plan LUE grasp and  release and NMR, sensory strategies for safety, shoulder and scap stabilization LUE for pain management    Consulted and Agree with Plan of Care Patient;Family member/caregiver    Family Member Consulted spouse             Patient will benefit from skilled therapeutic intervention in order to improve the following deficits and impairments:   Body Structure / Function / Physical Skills: ADL, GMC, FMC, Flexibility, Coordination, Sensation, IADL, ROM, UE functional use, Proprioception, Pain, Strength, Decreased knowledge of use of DME       Visit Diagnosis: Muscle weakness (generalized)  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-dominant side (HCC)  Unsteadiness on feet  Other lack of coordination  Chronic left shoulder pain  Visuospatial deficit    Problem List Patient Active Problem List   Diagnosis Date Noted   Colon cancer screening    Left hemiplegia (HCC) 07/02/2020   Cognitive deficit due to recent stroke 07/02/2020   Dysphagia, post-stroke    Vascular headache    Acute blood loss anemia    AKI (acute kidney injury) (HCC)    Essential hypertension    Seizures (HCC)    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   ICH (intracerebral hemorrhage) (HCC) 06/09/2020   Sprain of anterior talofibular ligament of left ankle 01/15/2020   Hyperlipidemia, on Lipitor 04/01/2017   Alcohol use disorder, mild, abuse 06/30/2016   Morbid obesity (HCC) 06/30/2016   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   Chronic combined systolic and diastolic CHF (congestive heart failure) (HCC) 11/08/2012   HTN (hypertension) 05/25/2012    Junious Dresser, OT 01/20/2021, 5:07 PM  [facility] [address] [place], [place], [address] Phone: [phone]   Fax:  [phone]  Name: [name] MRN: [id number] Date of Birth: May 30, 1965

## 2021-01-20 NOTE — Progress Notes (Signed)
Patient ID: [name], male    DOB: 10/12/1965  MRN: [id number]  CC: Paperwork  Subjective: [name] is a 55 y.o. male who presents for paperwork.   His concerns today include:  PAPERWORK: Requesting completion of disability forms from [employer]. He is employed by [employer].    HYPERTENSION FOLLOW-UP: 07/30/2020: - Continue Amlodipine, Clonidine, Labetalol, and Losartan as prescribed.   02/01/2021: Doing well on current regimen. No side effects. No issues/concerns. Denies chest pain and shortness of breath. Reports normal blood pressures. Having some left lower extremity edema. Denies pain, tenderness, warmth, and additional red flag symptoms. Reports he does sit often with feet flat on the floor.   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     Blood Pressure KIT 1 application by Does not apply route 2 (two) times daily. 1 kit 0   diclofenac Sodium (VOLTAREN) 1 % GEL Apply 2 g topically 4 (four) times daily. (Patient taking differently: Apply 2 g topically 4 (four) times daily as needed  (pain).) 200 g 0   DULoxetine (CYMBALTA) 20 MG capsule Take 2 capsules (40 mg total) by mouth daily. 60 capsule 2   folic acid (FOLVITE) 1 MG tablet Take 1 tablet (1 mg total) by mouth daily. 90 tablet 3   gabapentin (NEURONTIN) 100 MG capsule Take 200 mg by mouth 3 (three) times daily.     gabapentin (NEURONTIN) 300 MG capsule Take 1 capsule (300 mg total) by mouth 3 (three) times daily. (Patient not taking: Reported on 12/07/2020) 90 capsule 3   hydrocerin (EUCERIN) CREA Apply 1 application topically 2 (two) times daily. (Patient not taking: Reported on 12/07/2020) 228 g 0   HYDROcodone-acetaminophen (NORCO/VICODIN) 5-325 MG tablet Take 1 tablet by mouth at bedtime as needed for severe pain. 30 tablet 0   labetalol (NORMODYNE) 100 MG tablet Take 1 tablet (100 mg total) by mouth 2 (two) times daily. 180 tablet 0   levETIRAcetam (KEPPRA) 500 MG tablet Take 1 tablet (500 mg total) by mouth 2 (two) times daily. 180 tablet 3   lidocaine (LIDODERM) 5 % PLACE 2 PATCHES TO THE SKIN ON BACK DAILY AT 8AM AND REMOVE AT 8PM. HAS TO BE OFF 12 HOURS. (Patient taking differently: Place 2 patches onto the skin daily as needed (pain).) 60 patch 3   Multiple Vitamins-Minerals (CERTAVITE/ANTIOXIDANTS) TABS Take 1 tablet by mouth daily. 30 tablet 0   naphazoline-pheniramine (NAPHCON-A) 0.025-0.3 % ophthalmic solution Place 1 drop into the right eye 4 (four) times daily as needed for eye irritation. 15 mL 0  pantoprazole (PROTONIX) 40 MG tablet Take 1 tablet (40 mg total) by mouth daily. (Patient not taking: Reported on 12/07/2020) 30 tablet 0   polyvinyl alcohol (LIQUIFILM TEARS) 1.4 % ophthalmic solution Place 1 drop into both eyes every 6 (six) hours as needed for dry eyes. 15 mL 0   senna-docusate (SENOKOT-S) 8.6-50 MG tablet Take 2 tablets by mouth daily with supper. (Patient taking differently: Take 2 tablets by mouth daily as needed for moderate constipation.) 60 tablet 0   tamsulosin (FLOMAX) 0.4 MG CAPS  capsule Take 1 capsule (0.4 mg total) by mouth daily after supper. 30 capsule 3   thiamine 100 MG tablet Take 1 tablet (100 mg total) by mouth daily. 30 tablet 0   topiramate (TOPAMAX) 25 MG tablet Take 2 tablets (50 mg total) by mouth daily. 180 tablet 0   triamcinolone cream (KENALOG) 0.1 % Apply 1 application topically 2 (two) times daily. (Patient not taking: No sig reported) 80 g 0   No current facility-administered medications on file prior to visit.    No Known Allergies  Social History   Socioeconomic History   Marital status: Married    Spouse name: Not on file   Number of children: Not on file   Years of education: Not on file   Highest education level: Not on file  Occupational History   Not on file  Tobacco Use   Smoking status: Never   Smokeless tobacco: Never  Vaping Use   Vaping Use: Never used  Substance and Sexual Activity   Alcohol use: Not Currently   Drug use: No   Sexual activity: Yes  Other Topics Concern   Not on file  Social History Narrative   Not on file   Social Determinants of Health   Financial Resource Strain: Not on file  Food Insecurity: Not on file  Transportation Needs: Not on file  Physical Activity: Not on file  Stress: Not on file  Social Connections: Not on file  Intimate Partner Violence: Not on file    Family History  Problem Relation Age of Onset   Hypertension Mother        Passed away when she is 86 secondary to hip surgery complications   CAD Mother    Heart attack Mother    Hypertension Father    Diabetes Brother    Stroke Brother    Heart attack Maternal Grandmother    Colon cancer Neg Hx    Esophageal cancer Neg Hx    Pancreatic cancer Neg Hx    Stomach cancer Neg Hx    Colon polyps Neg Hx     Past Surgical History:  Procedure Laterality Date   COLONOSCOPY WITH PROPOFOL N/A 12/09/2020   Procedure: COLONOSCOPY WITH PROPOFOL;  Surgeon: Yetta Flock, MD;  [facility];  Service:  Gastroenterology;  Laterality: N/A;   KNEE ARTHROSCOPY Left 04/13/2016   Procedure: ARTHROSCOPY KNEE WITH DEBRIDEMENT;  Surgeon: Garald Balding, MD;  Location: [facility];  Service: Orthopedics;  Laterality: Left;   KNEE ARTHROSCOPY WITH MEDIAL MENISECTOMY Right 04/13/2016   Procedure: KNEE ARTHROSCOPY WITH MEDIAL MENISECTOMY;  Surgeon: Garald Balding, MD;  Location: [facility];  Service: Orthopedics;  Laterality: LEFT not right knee   NASAL FRACTURE SURGERY     SHOULDER SURGERY Left     ROS: Review of Systems Negative except as stated above  PHYSICAL EXAM: BP 127/75 (BP Location: Left Arm, Patient Position: Sitting, Cuff Size: Large)  Pulse 100    Temp 98 F (36.7 C)    Resp 18    Ht 5' 10"  (1.778 m)    Wt 214 lb (97.1 kg)    SpO2 95%    BMI 30.71 kg/m    Physical Exam HENT:     Head: Normocephalic and atraumatic.  Eyes:     Extraocular Movements: Extraocular movements intact.     Conjunctiva/sclera: Conjunctivae normal.     Pupils: Pupils are equal, round, and reactive to light.  Cardiovascular:     Rate and Rhythm: Normal rate and regular rhythm.     Pulses: Normal pulses.     Heart sounds: Normal heart sounds.  Pulmonary:     Effort: Pulmonary effort is normal.     Breath sounds: Normal breath sounds.  Musculoskeletal:     Cervical back: Normal range of motion and neck supple.     Left lower leg: Swelling present.     Comments: Left lower extremity 1+ without erythema, pain or tenderness on palpation.   Neurological:     General: No focal deficit present.     Mental Status: He is alert and oriented to person, place, and time.  Psychiatric:        Mood and Affect: Mood normal.        Behavior: Behavior normal.    ASSESSMENT AND PLAN: 1. Encounter for completion of form with patient: - Disability paperwork from [place] per patient's employer [employer] completed today in office.   2. Essential hypertension: - Continue  Amlodipine, Clonidine, and Losartan as prescribed.  - Counseled on blood pressure goal of less than 130/80, low-sodium, DASH diet, medication compliance, 150 minutes of moderate intensity exercise per week as tolerated. Discussed medication compliance, adverse effects. - Follow-up with primary provider as scheduled. - amLODipine (NORVASC) 10 MG tablet; Take 1 tablet (10 mg total) by mouth daily.  Dispense: 90 tablet; Refill: 0 - cloNIDine (CATAPRES) 0.1 MG tablet; Take 1 tablet (0.1 mg total) by mouth 3 (three) times daily.  Dispense: 90 tablet; Refill: 0 - losartan (COZAAR) 50 MG tablet; Take 1 tablet (50 mg total) by mouth 2 (two) times daily.  Dispense: 180 tablet; Refill: 0  3. Dependent edema: - No evidence of red flag symptoms.  - Counseled: Keep bilateral legs raised above (elevated) above the level of your heart when you are sitting or lying down. Do not sit or stand for a long time. Always use supervision for safety. Wear support stockings as tolerated. - Follow-up with primary provider as scheduled.    Patient was given the opportunity to ask questions.  Patient verbalized understanding of the plan and was able to repeat key elements of the plan. Patient was given clear instructions to go to Emergency Department or return to medical center if symptoms don't improve, worsen, or new problems develop.The patient verbalized understanding.   Requested Prescriptions   Signed Prescriptions Disp Refills   amLODipine (NORVASC) 10 MG tablet 90 tablet 0    Sig: Take 1 tablet (10 mg total) by mouth daily.   cloNIDine (CATAPRES) 0.1 MG tablet 90 tablet 0    Sig: Take 1 tablet (0.1 mg total) by mouth 3 (three) times daily.   losartan (COZAAR) 50 MG tablet 180 tablet 0    Sig: Take 1 tablet (50 mg total) by mouth 2 (two) times daily.    Return in about 3 months (around 04/25/2021) for Follow-Up or next available hypertension .  Camillia Herter, NP

## 2021-01-21 NOTE — Therapy (Signed)
[facility] [address] [place], [place], [address] Phone: [phone]   Fax:  [phone]  Physical Therapy Treatment  Patient Details  Name: [name] MRN: [id number] Date of Birth: July 20, 1965 Referring Provider (PT): Leeroy Cha, MD   Encounter Date: 01/20/2021   PT End of Session - 01/20/21 1530     Visit Number 3    Number of Visits 15   1st 5 visits are at no charge; 30 PT/OT combined   Date for PT Re-Evaluation 02/04/21    Authorization Type [employer]. 30 visits PT/OT combined, each dicipline counts as 1 visit    PT Start Time 1532    PT Stop Time 49    PT Time Calculation (min) 43 min    Equipment Utilized During Treatment Gait belt    Activity Tolerance Patient tolerated treatment well    Behavior During Therapy WFL for tasks assessed/performed             Past Medical History:  Diagnosis Date   Anxiety    Cataract    removed left eye   Chronic combined systolic and diastolic CHF (congestive heart failure) (HCC)    EF 58-59%   Diastolic dysfunction    Dyslipidemia    Hyperlipidemia    Hypertension    Neuromuscular disorder (HCC)    neuropathy left foot   Nonischemic dilated cardiomyopathy ([place])    felt secondary to HTN with no ischemia on nuclear stress test 05/2012, EF 25-30%   Seizures ([place])    last seizure 05-2020- on Keppra   Shortness of breath    Stroke ([place]) 05/2020    Past Surgical History:  Procedure Laterality Date   COLONOSCOPY WITH PROPOFOL N/A 12/09/2020   Procedure: COLONOSCOPY WITH PROPOFOL;  Surgeon: Yetta Flock, MD;  [facility];  Service: Gastroenterology;  Laterality: N/A;   KNEE ARTHROSCOPY Left 04/13/2016   Procedure: ARTHROSCOPY KNEE WITH DEBRIDEMENT;  Surgeon: Garald Balding, MD;  Location: [facility];  Service: Orthopedics;  Laterality: Left;   KNEE ARTHROSCOPY WITH MEDIAL MENISECTOMY Right 04/13/2016   Procedure: KNEE  ARTHROSCOPY WITH MEDIAL MENISECTOMY;  Surgeon: Garald Balding, MD;  Location: [facility];  Service: Orthopedics;  Laterality: LEFT not right knee   NASAL FRACTURE SURGERY     SHOULDER SURGERY Left     There were no vitals filed for this visit.   Subjective Assessment - 01/21/21 1603     Subjective Pt reports no changes - states he still doesn't have feeling in his Lt foot    Pertinent History HTN, cardiomyopathy, chronic combined systolic & diastolic CHF, alcohol use disorder, seizures    Currently in Pain? No/denies                               [facility] Adult PT Treatment/Exercise - 01/21/21 0001       Transfers   Transfers Sit to Stand;Stand to Sit    Sit to Stand 5: Supervision    Stand to Sit 5: Supervision    Comments cues for Lt foot position to facilitate increased weight bearing on LLE with sit to stand      Ambulation/Gait   Ambulation/Gait Yes    Ambulation/Gait Assistance 4: Min guard    Ambulation Distance (Feet) 100 Feet   3 reps   Assistive device Straight cane;Small based quad cane;Large base quad cane   SPC with rubber  quad tip   Gait Pattern Decreased step length - left;Decreased arm swing - left;Decreased stance time - left;Decreased hip/knee flexion - left;Decreased dorsiflexion - left;Decreased weight shift to left    Ambulation Surface Level;Indoor      Exercises   Exercises Knee/Hip;Ankle      Knee/Hip Exercises: Stretches   Gastroc Stretch Left;1 rep;30 seconds   with 2" block in standing     Knee/Hip Exercises: Seated   Hamstring Curl Strengthening;Left;1 set;10 reps   red theraband used     Knee/Hip Exercises: Supine   Heel Slides AROM;Left;1 set;10 reps   min assist to decrease hip external rotation during flexion   Bridges AROM;Both;1 set;5 reps    Single Leg Bridge AROM;Left;1 set;10 reps    Straight Leg Raises AROM;Left;1 set;10 reps    Other Supine Knee/Hip Exercises hip abdct. with green theraband for  resistance    Other Supine Knee/Hip Exercises hip extension control 10 reps LLE off side of mat table      Knee/Hip Exercises: Sidelying   Hip ABduction AAROM;Left;1 set;5 reps   pt unable to abduct LLE in sidelying position           AFO was trialed on LLE 100' on 3rd rep - with use of SBQC 53' and then 41' with SPC; appeared to be beneficial with increased Lt foot clearance in swing but did not completely eliminate Lt foot catching floor during swing phase of gait         PT Education - 01/21/21 1608     Education Details instructed pt and wife that pt able to safely amb. with use of SBQC rather than hemiwalker    Person(s) Educated Patient;Spouse    Methods Explanation;Demonstration    Comprehension Verbalized understanding;Returned demonstration              PT Short Term Goals - 01/20/21 1520       PT SHORT TERM GOAL #1   Title Pt will improve Berg balance test score by at least 4 points to demo improvement in balance.    Baseline 01/06/21: initial score set this date is 40/56    Status Unable to assess      PT SHORT TERM GOAL #2   Title Improve TUG score from 38.87 secs to </= 33 secs with use of hemiwalker to demo improvement in functional mobility.    Baseline 01/06/21: unable to reassess due to time contraints, 38.87 sec's at baseline    Status Unable to assess      PT SHORT TERM GOAL #3   Title Amb. with SBQC 230' with CGA on flat, even surface for increased community accessibility.    Baseline 01/06/21: 100 feet with hemiwalker, improved just not to goal    Status Partially Met    Target Date 01/07/21      PT SHORT TERM GOAL #4   Title Independent in HEP for LLE strengthening.    Baseline 01/06/21: initial HEP initiated today    Status Partially Met               PT Long Term Goals - 01/21/21 1613       PT LONG TERM GOAL #1   Title Improve Berg balance test score by at least 8 points for reduced fall risk.    Time 8    Period Weeks     Status New    Target Date 02/04/21      PT LONG TERM GOAL #2   Title  Increase gait velocity from .68 ft/sec to >/= 1.4 ft/sec with hemiwalker for increased gait efficiency.    Baseline 47.78 secs = .68 ft/sec    Time 8    Period Weeks    Status New    Target Date 02/04/21      PT LONG TERM GOAL #3   Title Improve TUG score to </= 30 secs with hemiwalker to reduce fall risk and to demo incr. functional mobility.    Baseline 38.87 secs with hemiwalker    Time 8    Period Weeks    Status New    Target Date 02/04/21      PT LONG TERM GOAL #4   Title Amb. 30' without use of assistive device with SBA for household amb.    Baseline hemiwalker - 35' with SBA to CGA    Time 8    Period Weeks    Status New    Target Date 02/04/21      PT LONG TERM GOAL #5   Title Amb. 500' with SPC with SBA for incr. community accessibility.    Time 8    Period Weeks    Status New    Target Date 02/04/21      PT LONG TERM GOAL #6   Title Perform floor to stand transfer with UE support on object with SBA.    Time 8    Period Weeks    Status New    Target Date 02/04/21                   Plan - 01/21/21 1610     Clinical Impression Statement Pt did very well amb. with use of SBQC without LOB; [place] walk on AFO was trialed on LLE due to occasional Lt forefoot sticking to floor.  AFO appeared to be beneficial but pt continued to have a couple of occurrences of Lt foot catching floor; clearance was improved with pt amb. with slower gait speed.  Wife was informed that pt may progress to [facility] from hemiwalker.  Cont with POC.    Personal Factors and Comorbidities Behavior Pattern;Comorbidity 2;Profession;Transportation    Comorbidities cardiomyopathy, systolic & diastolic CHF, ICH, seizures    Examination-Activity Limitations Bend;Carry;Lift;Stand;Stairs;Squat;Locomotion Level;Transfers;Sleep;Bed Mobility    Examination-Participation Restrictions Cleaning;Community  Activity;Driving;Shop;Laundry;Occupation;Meal Prep;Yard Work    [employer] Evolving/Moderate complexity    Rehab Potential Good    PT Frequency 2x / week    PT Duration 8 weeks    PT Treatment/Interventions ADLs/Self Care Home Management;Aquatic Therapy;DME Instruction;Gait training;Stair training;Therapeutic activities;Therapeutic exercise;Balance training;Orthotic Fit/Training;Patient/family education;Neuromuscular re-education    PT Next Visit Plan added additional ex's to HEP; continue to work on strengthening and balance. trial AFO on left LE    PT Home Exercise Plan Access Code: [email or web address]    Consulted and Agree with Plan of Care Patient;Family member/caregiver    Family Member Consulted wife [name]             Patient will benefit from skilled therapeutic intervention in order to improve the following deficits and impairments:  Abnormal gait, Decreased activity tolerance, Decreased balance, Decreased coordination, Decreased strength, Impaired tone, Impaired sensation, Impaired UE functional use, Impaired vision/preception, Pain  Visit Diagnosis: 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-dominant side (HCC)  Unsteadiness on feet  Muscle weakness (generalized)     Problem List Patient Active Problem List   Diagnosis Date Noted   Colon cancer screening    Left hemiplegia ([place]) 07/02/2020   Cognitive  deficit due to recent stroke 07/02/2020   Dysphagia, post-stroke    Vascular headache    Acute blood loss anemia    AKI (acute kidney injury) ([place])    Essential hypertension    Seizures ([place])    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   ICH (intracerebral hemorrhage) ([place]) 06/09/2020   Sprain of anterior talofibular ligament of left ankle 01/15/2020   Hyperlipidemia, on Lipitor 04/01/2017   Alcohol use disorder, mild, abuse 06/30/2016   Morbid obesity ([place]) 06/30/2016   Nonischemic dilated cardiomyopathy ([place])  11/08/2012   Chronic combined systolic and diastolic CHF (congestive heart failure) ([place]) 11/08/2012   HTN (hypertension) 05/25/2012    Triva Hueber, [place], PT 01/21/2021, [facility] [address] [place], [place], [address] Phone: [phone]   Fax:  [phone]  Name: [name] MRN: [id number] Date of Birth: [birth year]

## 2021-01-25 ENCOUNTER — Encounter: Payer: Self-pay | Admitting: Family

## 2021-01-25 ENCOUNTER — Ambulatory Visit: Payer: BC Managed Care – PPO | Admitting: Family

## 2021-01-25 ENCOUNTER — Other Ambulatory Visit: Payer: Self-pay

## 2021-01-25 VITALS — BP 127/75 | HR 100 | Temp 98.0°F | Resp 18 | Ht 70.0 in | Wt 214.0 lb

## 2021-01-25 DIAGNOSIS — Z0289 Encounter for other administrative examinations: Secondary | ICD-10-CM

## 2021-01-25 DIAGNOSIS — R609 Edema, unspecified: Secondary | ICD-10-CM

## 2021-01-25 DIAGNOSIS — I10 Essential (primary) hypertension: Secondary | ICD-10-CM | POA: Diagnosis not present

## 2021-01-25 MED ORDER — LOSARTAN POTASSIUM 50 MG PO TABS: 50.0000 mg | ORAL_TABLET | Freq: Two times a day (BID) | ORAL | 0 refills | Status: DC

## 2021-01-25 MED ORDER — AMLODIPINE BESYLATE 10 MG PO TABS: 10.0000 mg | ORAL_TABLET | Freq: Every day | ORAL | 0 refills | Status: DC

## 2021-01-25 MED ORDER — CLONIDINE HCL 0.1 MG PO TABS: 0.1000 mg | ORAL_TABLET | Freq: Three times a day (TID) | ORAL | 0 refills | Status: DC

## 2021-01-25 NOTE — Progress Notes (Deleted)
Patient ID: [name], male    DOB: 09-Mar-1965  MRN: [id number]  CC: Hypertension Follow-Up   Subjective: [name] is a 56 y.o. male who presents for hypertension follow-up.   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   ICH (intracerebral hemorrhage) ([place]) 06/09/2020   Sprain of anterior talofibular ligament of left ankle 01/15/2020   Hyperlipidemia, on Lipitor 04/01/2017   Alcohol use disorder, mild, abuse 06/30/2016   Morbid obesity ([place]) 06/30/2016   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 90 tablet 0   atorvastatin (LIPITOR) 40 MG tablet Take 40 mg by mouth at bedtime.     Blood Pressure KIT 1 application by Does not apply route 2 (two) times daily. 1 kit 0   cloNIDine (CATAPRES) 0.1 MG tablet Take 1 tablet (0.1 mg total) by mouth 3 (three) times daily. 90 tablet 11   diclofenac Sodium (VOLTAREN) 1 % GEL Apply 2 g topically 4 (four) times daily. (Patient taking differently: Apply 2 g topically 4 (four) times daily as needed (pain).) 200 g 0   DULoxetine (CYMBALTA) 20 MG capsule Take 2 capsules (40 mg total) by mouth daily. 60 capsule 2   folic acid (FOLVITE) 1 MG tablet Take 1 tablet (1 mg total) by mouth daily. 90 tablet 3   gabapentin (NEURONTIN) 100 MG capsule Take 200 mg by mouth 3 (three) times daily.     gabapentin  (NEURONTIN) 300 MG capsule Take 1 capsule (300 mg total) by mouth 3 (three) times daily. (Patient not taking: Reported on 12/07/2020) 90 capsule 3   hydrocerin (EUCERIN) CREA Apply 1 application topically 2 (two) times daily. (Patient not taking: Reported on 12/07/2020) 228 g 0   HYDROcodone-acetaminophen (NORCO/VICODIN) 5-325 MG tablet Take 1 tablet by mouth at bedtime as needed for severe pain. 30 tablet 0   labetalol (NORMODYNE) 100 MG tablet Take 1 tablet (100 mg total) by mouth 2 (two) times daily. 180 tablet 0   levETIRAcetam (KEPPRA) 500 MG tablet Take 1 tablet (500 mg total) by mouth 2 (two) times daily. 180 tablet 3   lidocaine (LIDODERM) 5 % PLACE 2 PATCHES TO THE SKIN ON BACK DAILY AT 8AM AND REMOVE AT 8PM. HAS TO BE OFF 12 HOURS. (Patient taking differently: Place 2 patches onto the skin daily as needed (pain).) 60 patch 3   losartan (COZAAR) 50 MG tablet Take 1 tablet (50 mg total) by mouth 2 (two) times daily. 180 tablet 0   Multiple Vitamins-Minerals (CERTAVITE/ANTIOXIDANTS) TABS Take 1 tablet by mouth daily. 30 tablet 0   naphazoline-pheniramine (NAPHCON-A) 0.025-0.3 % ophthalmic solution Place 1 drop into the right eye 4 (four) times daily as needed for eye irritation. 15 mL 0   pantoprazole (PROTONIX) 40 MG tablet Take 1 tablet (40 mg total) by mouth  daily. (Patient not taking: Reported on 12/07/2020) 30 tablet 0   polyvinyl alcohol (LIQUIFILM TEARS) 1.4 % ophthalmic solution Place 1 drop into both eyes every 6 (six) hours as needed for dry eyes. 15 mL 0   senna-docusate (SENOKOT-S) 8.6-50 MG tablet Take 2 tablets by mouth daily with supper. (Patient taking differently: Take 2 tablets by mouth daily as needed for moderate constipation.) 60 tablet 0   tamsulosin (FLOMAX) 0.4 MG CAPS capsule Take 1 capsule (0.4 mg total) by mouth daily after supper. 30 capsule 3   thiamine 100 MG tablet Take 1 tablet (100 mg total) by mouth daily. 30 tablet 0   topiramate (TOPAMAX) 25 MG tablet Take 2  tablets (50 mg total) by mouth daily. 180 tablet 0   triamcinolone cream (KENALOG) 0.1 % Apply 1 application topically 2 (two) times daily. (Patient not taking: No sig reported) 80 g 0   No current facility-administered medications on file prior to visit.    No Known Allergies  Social History   Socioeconomic History   Marital status: Married    Spouse name: Not on file   Number of children: Not on file   Years of education: Not on file   Highest education level: Not on file  Occupational History   Not on file  Tobacco Use   Smoking status: Never   Smokeless tobacco: Never  Vaping Use   Vaping Use: Never used  Substance and Sexual Activity   Alcohol use: Not Currently   Drug use: No   Sexual activity: Yes  Other Topics Concern   Not on file  Social History Narrative   Not on file   Social Determinants of Health   Financial Resource Strain: Not on file  Food Insecurity: Not on file  Transportation Needs: Not on file  Physical Activity: Not on file  Stress: Not on file  Social Connections: Not on file  Intimate Partner Violence: Not on file    Family History  Problem Relation Age of Onset   Hypertension Mother        Passed away when she is 29 secondary to hip surgery complications   CAD Mother    Heart attack Mother    Hypertension Father    Diabetes Brother    Stroke Brother    Heart attack Maternal Grandmother    Colon cancer Neg Hx    Esophageal cancer Neg Hx    Pancreatic cancer Neg Hx    Stomach cancer Neg Hx    Colon polyps Neg Hx     Past Surgical History:  Procedure Laterality Date   COLONOSCOPY WITH PROPOFOL N/A 12/09/2020   Procedure: COLONOSCOPY WITH PROPOFOL;  Surgeon: Yetta Flock, MD;  Location: [facility];  Service: Gastroenterology;  Laterality: N/A;   KNEE ARTHROSCOPY Left 04/13/2016   Procedure: ARTHROSCOPY KNEE WITH DEBRIDEMENT;  Surgeon: Garald Balding, MD;  Location: [facility];  Service: Orthopedics;   Laterality: Left;   KNEE ARTHROSCOPY WITH MEDIAL MENISECTOMY Right 04/13/2016   Procedure: KNEE ARTHROSCOPY WITH MEDIAL MENISECTOMY;  Surgeon: Garald Balding, MD;  Location: [facility];  Service: Orthopedics;  Laterality: LEFT not right knee   NASAL FRACTURE SURGERY     SHOULDER SURGERY Left     ROS: Review of Systems Negative except as stated above  PHYSICAL EXAM: There were no vitals taken for this visit.  Physical Exam  {male adult master:310786} {male adult master:310785}  CMP Latest Ref Rng & Units 09/29/2020 07/19/2020 07/12/2020  Glucose 65 - 99 mg/dL 88 109(H) 97  BUN 6 - 24 mg/dL _0 Creatinine 0.76 - 1.27 mg/dL 1.05 1.12 1.24  Sodium 134 - 144 mmol/L 143 138 140  Potassium 3.5 - 5.2 mmol/L 3.9 3.8 3.9  Chloride 96 - 106 mmol/L 107(H) 108 105  CO2 20 - 29 mmol/L _1 Calcium 8.7 - 10.2 mg/dL 10.3(H) 9.4 9.7  Total Protein 6.0 - 8.5 g/dL 7.7 - -  Total Bilirubin 0.0 - 1.2 mg/dL 0.4 - -  Alkaline Phos 44 - 121 IU/L 105 - -  AST 0 - 40 IU/L 18 - -  ALT 0 - 44 IU/L 22 - -   Lipid Panel     Component Value Date/Time   CHOL 154 09/29/2020 1645   TRIG 126 09/29/2020 1645   HDL 45 09/29/2020 1645   CHOLHDL 3.4 09/29/2020 1645   CHOLHDL 3.8 06/09/2020 1809   VLDL 66 (H) 06/09/2020 1809   LDLCALC 87 09/29/2020 1645    CBC    Component Value Date/Time   WBC 5.7 09/29/2020 1645   WBC 6.0 07/19/2020 0530   RBC 4.39 09/29/2020 1645   RBC 3.61 (L) 07/19/2020 0530   HGB 13.4 09/29/2020 1645   HCT 38.0 09/29/2020 1645   PLT 326 09/29/2020 1645   MCV 87 09/29/2020 1645   MCH 30.5 09/29/2020 1645   MCH 31.6 07/19/2020 0530   MCHC 35.3 09/29/2020 1645   MCHC 33.4 07/19/2020 0530   RDW 11.6 09/29/2020 1645   LYMPHSABS 1.8 06/28/2020 1103   MONOABS 0.8 06/28/2020 1103   EOSABS 0.1 06/28/2020 1103   BASOSABS 0.0 06/28/2020 1103    ASSESSMENT AND PLAN:  There are no diagnoses linked to this encounter.   Patient was given the  opportunity to ask questions.  Patient verbalized understanding of the plan and was able to repeat key elements of the plan. Patient was given clear instructions to go to Emergency Department or return to medical center if symptoms don't improve, worsen, or new problems develop.The patient verbalized understanding.   No orders of the defined types were placed in this encounter.    Requested Prescriptions    No prescriptions requested or ordered in this encounter    No follow-ups on file.  Camillia Herter, NP

## 2021-01-25 NOTE — Progress Notes (Signed)
Pt presents for completion of paperwork, pt states that when he when to PT on 12/29 left leg edema present, [place], MD advised that pt should discuss with provider

## 2021-01-27 ENCOUNTER — Encounter: Payer: Self-pay | Admitting: Occupational Therapy

## 2021-01-27 ENCOUNTER — Ambulatory Visit: Payer: BC Managed Care – PPO | Attending: Family | Admitting: Occupational Therapy

## 2021-01-27 ENCOUNTER — Encounter: Payer: Self-pay | Admitting: Physical Therapy

## 2021-01-27 ENCOUNTER — Other Ambulatory Visit: Payer: Self-pay

## 2021-01-27 ENCOUNTER — Ambulatory Visit: Payer: BC Managed Care – PPO | Admitting: Physical Therapy

## 2021-01-27 DIAGNOSIS — R2681 Unsteadiness on feet: Secondary | ICD-10-CM

## 2021-01-27 DIAGNOSIS — I69154 Hemiplegia and hemiparesis following nontraumatic intracerebral hemorrhage affecting left non-dominant side: Secondary | ICD-10-CM | POA: Diagnosis not present

## 2021-01-27 DIAGNOSIS — G8929 Other chronic pain: Secondary | ICD-10-CM | POA: Diagnosis not present

## 2021-01-27 DIAGNOSIS — R278 Other lack of coordination: Secondary | ICD-10-CM | POA: Diagnosis not present

## 2021-01-27 DIAGNOSIS — I69354 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-dominant side: Secondary | ICD-10-CM | POA: Insufficient documentation

## 2021-01-27 DIAGNOSIS — R41842 Visuospatial deficit: Secondary | ICD-10-CM | POA: Insufficient documentation

## 2021-01-27 DIAGNOSIS — M25512 Pain in left shoulder: Secondary | ICD-10-CM | POA: Diagnosis not present

## 2021-01-27 DIAGNOSIS — M6281 Muscle weakness (generalized): Secondary | ICD-10-CM

## 2021-01-27 DIAGNOSIS — R569 Unspecified convulsions: Secondary | ICD-10-CM | POA: Diagnosis not present

## 2021-01-27 DIAGNOSIS — R2689 Other abnormalities of gait and mobility: Secondary | ICD-10-CM | POA: Insufficient documentation

## 2021-01-27 DIAGNOSIS — I610 Nontraumatic intracerebral hemorrhage in hemisphere, subcortical: Secondary | ICD-10-CM | POA: Diagnosis not present

## 2021-01-27 DIAGNOSIS — I5042 Chronic combined systolic (congestive) and diastolic (congestive) heart failure: Secondary | ICD-10-CM | POA: Diagnosis not present

## 2021-01-27 NOTE — Therapy (Signed)
[facility] [address] [place], [place], [address] Phone: [phone]   Fax:  [phone]  Occupational Therapy Treatment  Patient Details  Name: [name] MRN: [id number] Date of Birth: 1966/01/17 Referring Provider (OT): Sula Soda, MD   Encounter Date: 01/27/2021   OT End of Session - 01/27/21 1626     Visit Number 4    Number of Visits 13    Date for OT Re-Evaluation 02/01/21   8 week plan of care   Authorization Type BCBS    Authorization Time Period 1st 5 visits are at no charge, VL: PT/OT combined 30    Authorization - Number of Visits 15    OT Start Time 1622    OT Stop Time 1700    OT Time Calculation (min) 38 min    Activity Tolerance Patient tolerated treatment well    Behavior During Therapy [name] for tasks assessed/performed             Past Medical History:  Diagnosis Date   Anxiety    Cataract    removed left eye   Chronic combined systolic and diastolic CHF (congestive heart failure) (HCC)    EF 25-30%   Diastolic dysfunction    Dyslipidemia    Hyperlipidemia    Hypertension    Neuromuscular disorder (HCC)    neuropathy left foot   Nonischemic dilated cardiomyopathy ([facility])    felt secondary to HTN with no ischemia on nuclear stress test 05/2012, EF 25-30%   Seizures ([facility])    last seizure 05-2020- on Keppra   Shortness of breath    Stroke (HCC) 05/2020    Past Surgical History:  Procedure Laterality Date   COLONOSCOPY WITH PROPOFOL N/A 12/09/2020   Procedure: COLONOSCOPY WITH PROPOFOL;  Surgeon: Benancio Deeds, MD;  Location: [facility];  Service: Gastroenterology;  Laterality: N/A;   KNEE ARTHROSCOPY Left 04/13/2016   Procedure: ARTHROSCOPY KNEE WITH DEBRIDEMENT;  Surgeon: Valeria [place], MD;  Location: [facility];  Service: Orthopedics;  Laterality: Left;   KNEE ARTHROSCOPY WITH MEDIAL MENISECTOMY Right 04/13/2016   Procedure: KNEE ARTHROSCOPY  WITH MEDIAL MENISECTOMY;  Surgeon: Valeria [place], MD;  Location: [facility];  Service: Orthopedics;  Laterality: LEFT not right knee   NASAL FRACTURE SURGERY     SHOULDER SURGERY Left     There were no vitals filed for this visit.   Subjective Assessment - 01/27/21 1626     Subjective  "same ole thing with this neuropathy in this foot"    Pertinent History hypertension, obesity, cardiomyopathy, hyperlipidemia, CHF    Limitations Fall Risk. Sensory Deficits LUE    Patient Stated Goals driving, cooking, walking, bathing himself    Currently in Pain? Yes    Pain Location Foot    Pain Orientation Left    Pain Descriptors / Indicators Tingling;Pins and needles    Pain Type Neuropathic pain    Pain Onset More than a month ago    Pain Frequency Constant             ADLs discussed possible ways to get patient into home shower - gave information regarding transfer tub bench for increasing transfer independence and bathing. Pt currently bathing at son in law's house with more accessible bathroom but was open to information on bench for being able to bathe at house.   Myofascial release to erector on left side of trunk/back for increased mobility with trunk rotation.  NMR  for LUE shoulder with reaching patterns and working on increaesd range of motion and scapular mobility.                     OT Education - 01/27/21 1630     Education Details sensory strategies and education for safety, information about transfer tub bench    Person(s) Educated Patient    Methods Explanation;Handout    Comprehension Verbalized understanding              OT Short Term Goals - 01/27/21 1631       OT SHORT TERM GOAL #1   Title Pt will be independent with HEP targeting LUE range of motion and coordination    Time 4    Period Weeks    Status On-going    Target Date 01/05/21      OT SHORT TERM GOAL #2   Title Pt will increase box and blocks score with LUE  to 10 blocks or greater for increasing functional use of LUE.    Baseline R 40, L 7    Time 4    Period Weeks    Status On-going      OT SHORT TERM GOAL #3   Title Pt will verbalize understanding of sensory safety strategies for increasing independence and safety    Time 4    Period Weeks    Status Achieved      OT SHORT TERM GOAL #4   Title Pt will perform simple warm meal prep and/or light housekeeping with supervision and incresed safety awareness d/t sensory deficits.    Time 4    Period Weeks    Status New               OT Long Term Goals - 12/08/20 1128       OT LONG TERM GOAL #1   Title Pt will be independent with any updated HEPs    Time 8    Period Weeks    Status New      OT LONG TERM GOAL #2   Title Pt will perform bathing with mod I and good safety awareness consistently, per report.    Time 8    Period Weeks    Status New      OT LONG TERM GOAL #3   Title Pt will assist with at least one home management and/or meal prep task per day in order to increase independence and decrease caregiver burden.    Time 8    Period Weeks    Status New      OT LONG TERM GOAL #4   Title Pt will obtain a lightweight object from a low to mid level surface with LUE in order to increase ability to use LUE for functional reaching and use.    Time 8    Period Weeks    Status New                   Plan - 01/27/21 1709     Clinical Impression Statement Pt progressing with LUE and with decreased pain in LUE.    OT Occupational Profile and History Problem Focused Assessment - Including review of records relating to presenting problem    Occupational performance deficits (Please refer to evaluation for details): ADL's;IADL's;Leisure    Body Structure / Function / Physical Skills ADL;GMC;FMC;Flexibility;Coordination;Sensation;IADL;ROM;UE functional use;Proprioception;Pain;Strength;Decreased knowledge of use of DME    Rehab Potential Good    Clinical Decision  Making Limited  treatment options, no task modification necessary    Comorbidities Affecting Occupational Performance: None    Modification or Assistance to Complete Evaluation  No modification of tasks or assist necessary to complete eval    OT Frequency 2x / week    OT Duration 8 weeks   12 visits over 8 weeks - starting with 1x/week until determine visit limit with insurance to use visits efficiently.   OT Treatment/Interventions Self-care/ADL training;Aquatic Therapy;Patient/family education;Manual Therapy;Electrical Stimulation;Neuromuscular education;Functional Mobility Training;Passive range of motion;Visual/perceptual remediation/compensation;Therapeutic exercise;Ultrasound;Moist Heat;Fluidtherapy;DME and/or AE instruction;Therapeutic activities;Splinting    Plan LUE grasp and release and NMR, sensory strategies for safety, shoulder and scap stabilization LUE for pain management    Consulted and Agree with Plan of Care Patient;Family member/caregiver    Family Member Consulted spouse             Patient will benefit from skilled therapeutic intervention in order to improve the following deficits and impairments:   Body Structure / Function / Physical Skills: ADL, GMC, FMC, Flexibility, Coordination, Sensation, IADL, ROM, UE functional use, Proprioception, Pain, Strength, Decreased knowledge of use of DME       Visit Diagnosis: 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-dominant side (HCC)  Unsteadiness on feet  Muscle weakness (generalized)  Other lack of coordination  Chronic left shoulder pain  Visuospatial deficit  Other abnormalities of gait and mobility  Hemiplegia and hemiparesis following nontraumatic intracerebral hemorrhage affecting left non-dominant side (HCC)    Problem List Patient Active Problem List   Diagnosis Date Noted   Colon cancer screening    Left hemiplegia (HCC) 07/02/2020   Cognitive deficit due to recent  stroke 07/02/2020   Dysphagia, post-stroke    Vascular headache    Acute blood loss anemia    AKI (acute kidney injury) ([facility])    Essential hypertension    Seizures (HCC)    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   ICH (intracerebral hemorrhage) (HCC) 06/09/2020   Sprain of anterior talofibular ligament of left ankle 01/15/2020   Hyperlipidemia, on Lipitor 04/01/2017   Alcohol use disorder, mild, abuse 06/30/2016   Morbid obesity (HCC) 06/30/2016   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   Chronic combined systolic and diastolic CHF (congestive heart failure) ([facility]) 11/08/2012   HTN (hypertension) 05/25/2012    [name], OT 01/27/2021, 5:09 PM  [facility] [address] [place], [place], [address] Phone: [phone]   Fax:  [phone]  Name: [name] MRN: [id number] Date of Birth: [birth year]

## 2021-01-27 NOTE — Patient Instructions (Signed)
Dorsiflexion: Resisted - do in seated position; hold band down with Rt foot     Facing anchor, tubing around left foot, pull toward face.  Repeat __10__ times per set. Do __1-2__ sets per session. Do _1___ sessions per day.  [email or web address].[email or web address]   Copyright  VHI. All rights reserved.

## 2021-01-28 NOTE — Therapy (Signed)
[facility] [address] [place], [place], [address] Phone: [phone]   Fax:  [phone]  Physical Therapy Treatment  Patient Details  Name: [name] MRN: [id number] Date of Birth: [birth year] Referring Provider (PT): Leeroy Cha, MD   Encounter Date: 01/27/2021   PT End of Session - 01/28/21 1608     Visit Number 4    Number of Visits 15   1st 5 visits are at no charge; 30 PT/OT combined   Date for PT Re-Evaluation 02/04/21    Authorization Type [employer]. 30 visits PT/OT combined, each dicipline counts as 1 visit    PT Start Time 1535    PT Stop Time 44    PT Time Calculation (min) 40 min    Equipment Utilized During Treatment Gait belt    Activity Tolerance Patient tolerated treatment well    Behavior During Therapy WFL for tasks assessed/performed             Past Medical History:  Diagnosis Date   Anxiety    Cataract    removed left eye   Chronic combined systolic and diastolic CHF (congestive heart failure) (HCC)    EF 69-62%   Diastolic dysfunction    Dyslipidemia    Hyperlipidemia    Hypertension    Neuromuscular disorder (HCC)    neuropathy left foot   Nonischemic dilated cardiomyopathy ([place])    felt secondary to HTN with no ischemia on nuclear stress test 05/2012, EF 25-30%   Seizures ([place])    last seizure 05-2020- on Keppra   Shortness of breath    Stroke ([place]) 05/2020    Past Surgical History:  Procedure Laterality Date   COLONOSCOPY WITH PROPOFOL N/A 12/09/2020   Procedure: COLONOSCOPY WITH PROPOFOL;  Surgeon: Yetta Flock, MD;  Location: [facility];  Service: Gastroenterology;  Laterality: N/A;   KNEE ARTHROSCOPY Left 04/13/2016   Procedure: ARTHROSCOPY KNEE WITH DEBRIDEMENT;  Surgeon: Garald Balding, MD;  Location: [facility];  Service: Orthopedics;  Laterality: Left;   KNEE ARTHROSCOPY WITH MEDIAL MENISECTOMY Right 04/13/2016   Procedure: KNEE  ARTHROSCOPY WITH MEDIAL MENISECTOMY;  Surgeon: Garald Balding, MD;  Location: [facility];  Service: Orthopedics;  Laterality: LEFT not right knee   NASAL FRACTURE SURGERY     SHOULDER SURGERY Left     There were no vitals filed for this visit.   Subjective Assessment - 01/27/21 1540     Subjective Pt reports he has a quad cane but didn't bring it today because he wasn't sure which vehicle he would be in for transportation    Pertinent History HTN, cardiomyopathy, chronic combined systolic & diastolic CHF, alcohol use disorder, seizures    Currently in Pain? Yes    Pain Score 6     Pain Location Foot    Pain Orientation Left    Pain Descriptors / Indicators Tingling;Pins and needles    Pain Type Neuropathic pain    Pain Onset More than a month ago    Pain Frequency Constant                               [facility] Adult PT Treatment/Exercise - 01/28/21 0001       Transfers   Transfers Sit to Stand;Stand to Sit    Sit to Stand 4: Min assist;3: Mod assist    Stand to Sit 4: Min assist  Comments Rt foot placed on balance bubble for increased LLE weight bearing and strengthening      Ambulation/Gait   Ambulation/Gait Yes    Ambulation/Gait Assistance 4: Min guard    Ambulation Distance (Feet) 115 Feet    Assistive device Small based quad cane    Gait Pattern Decreased step length - left;Decreased arm swing - left;Decreased stance time - left;Decreased hip/knee flexion - left;Decreased dorsiflexion - left;Decreased weight shift to left    Ambulation Surface Level;Indoor    Stairs Yes    Stairs Assistance 4: Min guard    Stair Management Technique One rail Right;Step to pattern;Forwards    Number of Stairs 4    Height of Stairs 6    Ramp 4: Min assist   with SBQC   Curb 4: Min assist   pt descends with RLE first, rather than LLE first following [facility]     Exercises   Exercises Knee/Hip;Ankle      Knee/Hip Exercises: Standing   Forward Step  Up Left;1 set;10 reps;Hand Hold: 2;Step Height: 6"      Ankle Exercises: Stretches   Gastroc Stretch 1 rep;30 seconds   LLE in standing     Ankle Exercises: Seated   Toe Raise 10 reps;3 seconds   with red theraband - LLE                      PT Short Term Goals - 01/28/21 1612       PT SHORT TERM GOAL #1   Title Pt will improve Berg balance test score by at least 4 points to demo improvement in balance.    Baseline 01/06/21: initial score set this date is 40/56    Status Unable to assess      PT SHORT TERM GOAL #2   Title Improve TUG score from 38.87 secs to </= 33 secs with use of hemiwalker to demo improvement in functional mobility.    Baseline 01/06/21: unable to reassess due to time contraints, 38.87 sec's at baseline    Status Unable to assess      PT SHORT TERM GOAL #3   Title Amb. with SBQC 230' with CGA on flat, even surface for increased community accessibility.    Baseline 01/06/21: 100 feet with hemiwalker, improved just not to goal    Status Partially Met    Target Date 01/07/21      PT SHORT TERM GOAL #4   Title Independent in HEP for LLE strengthening.    Baseline 01/06/21: initial HEP initiated today    Status Partially Met               PT Long Term Goals - 01/28/21 1612       PT LONG TERM GOAL #1   Title Improve Berg balance test score by at least 8 points for reduced fall risk.    Time 8    Period Weeks    Status New    Target Date 02/04/21      PT LONG TERM GOAL #2   Title Increase gait velocity from .68 ft/sec to >/= 1.4 ft/sec with hemiwalker for increased gait efficiency.    Baseline 47.78 secs = .68 ft/sec    Time 8    Period Weeks    Status New    Target Date 02/04/21      PT LONG TERM GOAL #3   Title Improve TUG score to </= 30 secs with hemiwalker to reduce fall risk and  to demo incr. functional mobility.    Baseline 38.87 secs with hemiwalker    Time 8    Period Weeks    Status New    Target Date 02/04/21       PT LONG TERM GOAL #4   Title Amb. 30' without use of assistive device with SBA for household amb.    Baseline hemiwalker - 25' with SBA to CGA    Time 8    Period Weeks    Status New    Target Date 02/04/21      PT LONG TERM GOAL #5   Title Amb. 500' with SPC with SBA for incr. community accessibility.    Time 8    Period Weeks    Status New    Target Date 02/04/21      PT LONG TERM GOAL #6   Title Perform floor to stand transfer with UE support on object with SBA.    Time 8    Period Weeks    Status New    Target Date 02/04/21                   Plan - 01/28/21 1609     Clinical Impression Statement Session focused on gait training with use of SBQC and also on LLE strengthening.  Pt requests to hold off on AFO at this time, stating he isn't sure it will be needed.  Gait pattern is impacted by decreased sensation in LLE (due to neuropathy).  Pt had approx. 3 occurrences of Lt foot catching floor but able did not lose balance with this occurrence.  Pt needs cues to stand erect with ambulation with use of quad cane due to looking down at Lt foot due to decreased sensation.  Cont with POC.    Personal Factors and Comorbidities Behavior Pattern;Comorbidity 2;Profession;Transportation    Comorbidities cardiomyopathy, systolic & diastolic CHF, ICH, seizures    Examination-Activity Limitations Bend;Carry;Lift;Stand;Stairs;Squat;Locomotion Level;Transfers;Sleep;Bed Mobility    Examination-Participation Restrictions Cleaning;Community Activity;Driving;Shop;Laundry;Occupation;Meal Prep;Yard Work    [employer] Evolving/Moderate complexity    Rehab Potential Good    PT Frequency 2x / week    PT Duration 8 weeks    PT Treatment/Interventions ADLs/Self Care Home Management;Aquatic Therapy;DME Instruction;Gait training;Stair training;Therapeutic activities;Therapeutic exercise;Balance training;Orthotic Fit/Training;Patient/family education;Neuromuscular  re-education    PT Next Visit Plan added additional ex's to HEP; continue to work on strengthening and balance. trial AFO on left LE    PT Home Exercise Plan Access Code: [email or web address]    Consulted and Agree with Plan of Care Patient;Family member/caregiver    Family Member Consulted wife [name]             Patient will benefit from skilled therapeutic intervention in order to improve the following deficits and impairments:  Abnormal gait, Decreased activity tolerance, Decreased balance, Decreased coordination, Decreased strength, Impaired tone, Impaired sensation, Impaired UE functional use, Impaired vision/preception, Pain  Visit Diagnosis: 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-dominant side (HCC)  Unsteadiness on feet  Muscle weakness (generalized)     Problem List Patient Active Problem List   Diagnosis Date Noted   Colon cancer screening    Left hemiplegia ([place]) 07/02/2020   Cognitive deficit due to recent stroke 07/02/2020   Dysphagia, post-stroke    Vascular headache    Acute blood loss anemia    AKI (acute kidney injury) ([place])    Essential hypertension    Seizures ([place])    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 [address] [place], [place], [address] Phone: [phone]   Fax:  [phone]  Name: Patrick Salemi III MRN: [id number] Date of Birth: [birth year]

## 2021-01-29 DIAGNOSIS — I610 Nontraumatic intracerebral hemorrhage in hemisphere, subcortical: Secondary | ICD-10-CM | POA: Diagnosis not present

## 2021-01-29 DIAGNOSIS — I5042 Chronic combined systolic (congestive) and diastolic (congestive) heart failure: Secondary | ICD-10-CM | POA: Diagnosis not present

## 2021-01-29 DIAGNOSIS — R569 Unspecified convulsions: Secondary | ICD-10-CM | POA: Diagnosis not present

## 2021-02-01 ENCOUNTER — Encounter
Payer: BC Managed Care – PPO | Attending: Physical Medicine and Rehabilitation | Admitting: Physical Medicine and Rehabilitation

## 2021-02-01 ENCOUNTER — Other Ambulatory Visit: Payer: Self-pay

## 2021-02-01 ENCOUNTER — Ambulatory Visit: Payer: BC Managed Care – PPO | Admitting: Family

## 2021-02-01 DIAGNOSIS — I610 Nontraumatic intracerebral hemorrhage in hemisphere, subcortical: Secondary | ICD-10-CM

## 2021-02-01 DIAGNOSIS — G8194 Hemiplegia, unspecified affecting left nondominant side: Secondary | ICD-10-CM

## 2021-02-01 DIAGNOSIS — I10 Essential (primary) hypertension: Secondary | ICD-10-CM

## 2021-02-01 MED ORDER — FUROSEMIDE 20 MG PO TABS: 20.0000 mg | ORAL_TABLET | Freq: Every day | ORAL | 3 refills | Status: DC

## 2021-02-01 NOTE — Progress Notes (Signed)
Subjective:    Patient ID: [name], male    DOB: 1965-07-13, 56 y.o.   MRN: [id number]  HPI An audio/video tele-health visit is felt to be the most appropriate encounter for this patient at this time. This is a follow up tele-visit via phone. The patient is at home. MD is at office. Prior to scheduling this appointment, our staff discussed the limitations of evaluation and management by telemedicine and the availability of in-person appointments. The patient expressed understanding and agreed to proceed.   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 -he has not tried anything for this pain -it is worst at night and feels like burning and tingling at his heel. -he has been doing great with therapy and is walking in his home! -his wife asks if he could get a handicap placard.  -the foot is still feeling numb with pins and needles, also painful. He is unsure whether Gabapentin helps- so far nothing he as tried has helped his neuropathy. His balance is also now off and he has been stumbling more. He actually felt that the Gabapentin made his pain worse. He has never tried Cymbalta but is willing to try it.  -Has been able to use bathroom himself! -he is working with therapy right now.  -he was feeling wobbly over the weekend so had to miss his PT session -shoulder is  feeling very numb and cannot make a fist -ready to proceed with Qutenza today -His wife asks for refill of Eucerin and Diclofenac and Tizanidine.  -She asks whether he should still be taking Seroquel -he called medical records and records were faxed on December 28th.  -He called our office today  -He needs medical records to process his disability claims  2) Lower extremity edema -worsening -not painful -he asks when his next appointment with me is.  -has gotten used to it -therapist was concerned   Family History  Problem Relation Age of Onset   Hypertension Mother        Passed away when she is 33 secondary to hip surgery complications   CAD Mother    Heart attack Mother    Hypertension Father    Diabetes Brother    Stroke Brother    Heart attack Maternal Grandmother    Colon cancer Neg Hx    Esophageal cancer Neg Hx    Pancreatic cancer Neg Hx    Stomach cancer Neg Hx    Colon polyps Neg Hx    Social History   Socioeconomic History   Marital status: Married    Spouse name: Not on file   Number of children: Not on file   Years of education: Not on file   Highest education level: Not on file  Occupational History   Not on file  Tobacco  Use   Smoking status: Never   Smokeless tobacco: Never  Vaping Use   Vaping Use: Never used  Substance and Sexual Activity   Alcohol use: Not Currently   Drug use: No   Sexual activity: Yes  Other Topics Concern   Not on file  Social History Narrative   Not on file   Social Determinants of Health   Financial Resource Strain: Not on file  Food Insecurity: Not on file  Transportation Needs: Not on file  Physical Activity: Not on file  Stress: Not on file  Social Connections: Not on file   Past Surgical History:  Procedure Laterality Date   COLONOSCOPY WITH PROPOFOL N/A 12/09/2020   Procedure: COLONOSCOPY WITH PROPOFOL;  Surgeon: Benancio Deeds, MD;  [facility];  Service: Gastroenterology;  Laterality:  N/A;   KNEE ARTHROSCOPY Left 04/13/2016   Procedure: ARTHROSCOPY KNEE WITH DEBRIDEMENT;  Surgeon: Valeria Batman, MD;  Location: [facility];  Service: Orthopedics;  Laterality: Left;   KNEE ARTHROSCOPY WITH MEDIAL MENISECTOMY Right 04/13/2016   Procedure: KNEE ARTHROSCOPY WITH MEDIAL MENISECTOMY;  Surgeon: Valeria Batman, MD;  Location: [facility];  Service: Orthopedics;  Laterality: LEFT not right knee   NASAL FRACTURE SURGERY     SHOULDER SURGERY Left    Past Medical History:  Diagnosis Date   Anxiety    Cataract    removed left eye   Chronic combined systolic and diastolic CHF (congestive heart failure) (HCC)    EF 25-30%   Diastolic dysfunction    Dyslipidemia    Hyperlipidemia    Hypertension    Neuromuscular disorder (HCC)    neuropathy left foot   Nonischemic dilated cardiomyopathy (HCC)    felt secondary to HTN with no ischemia on nuclear stress test 05/2012, EF 25-30%   Seizures (HCC)    last seizure 05-2020- on Keppra   Shortness of breath    Stroke (HCC) 05/2020   There were no vitals taken for this visit.  Opioid Risk Score:   Fall Risk Score:  `1  Depression screen PHQ 2/9  Depression screen [facility] 2/9 09/22/2020 09/14/2020 09/14/2020 07/30/2020  Decreased Interest 0 0 0 0  Down, Depressed, Hopeless 0 0 0 0  PHQ - 2 Score 0 0 0 0  Altered sleeping - 0 - 0  Tired, decreased energy - 0 - 0  Change in appetite - 0 - 0  Feeling bad or failure about yourself  - 0 - 0  Trouble concentrating - 0 - 0  Moving slowly or fidgety/restless - 0 - 0  Suicidal thoughts - 0 - 0  PHQ-9 Score - 0 - 0  Difficult doing work/chores Not difficult at all - - Not difficult at all    Review of Systems     Objective:   Physical Exam Not performed as patient was seen via phone.      Assessment & Plan:  [name] is a 56 year old man presenting today for f/u of left hemiplegia, right sided low back pain, and left sided foot pain following  stroke.  1) Right sided low back pain: -discussed with patient and wife that Xrs show spinal degeneration -pain appears to be more myofascial -trigger point injections with lidocaine provided <1 week of benefit -Would like to try Botox injections for longer relief as this pain is greatly inhibiting patient's function and quality of life -continue heat and lidocaine patch and home physical and occupational therapy -Can take  tylenol 500mg  three times per day -minimize use of Norco as much as possible as not benefiting and is a high risk opioid -can continue tizandine as needed.   2) left sided foot pain -stop Gabapentin  -start Cymbalta 20mg  daily, discussed side effects of nausea.  -recommended icing 15 minutes three times per day -hold off on therapy until neuropathy improves -can keep compression garment and boot off since these were worsening his pain -Will not repeat Qutenza since it did not did help foot pain -Turmeric to reduce inflammation--can be used in cooking or taken as a supplement.  Benefits of turmeric:  -Highly anti-inflammatory  -Increases antioxidants  -Improves memory, attention, brain disease  -Lowers risk of heart disease  -May help prevent cancer  -Decreases pain  -Alleviates depression  -Delays aging and decreases risk of chronic disease  -Consume with black pepper to increase absorption    3) CVA -provided with a handicap placard.  --progress to quad walker  4) Left sided arm pain: -has felt numbness and weakness in his upper extremity.  -refilled tizanidine, lidocaine patch, and diclofenac gel -can consider repeating Qutenza as it did help with his shoulder pain.  -continue tylenol before sleep  5) Agitation: resolved, may discontinue seroquel.  6) Bilateral lower extremity edema -recommended avoiding added salt -prescribed lasix 20mg  daily prn  7) HTN 128/68 -commended on improvements in blood pressure!  8) Obesity -commended on  weight loss! -current BMI is 30.71   6 minutes spent in discussing of his walking, his lower extremity edema, his disability paperwork, commended on weight loss and improvements in his blood pressure.

## 2021-02-03 ENCOUNTER — Ambulatory Visit: Payer: BC Managed Care – PPO | Admitting: Occupational Therapy

## 2021-02-03 ENCOUNTER — Other Ambulatory Visit: Payer: Self-pay | Admitting: Physical Medicine and Rehabilitation

## 2021-02-03 ENCOUNTER — Ambulatory Visit: Payer: BC Managed Care – PPO | Admitting: Physical Therapy

## 2021-02-03 ENCOUNTER — Other Ambulatory Visit: Payer: Self-pay

## 2021-02-08 ENCOUNTER — Other Ambulatory Visit: Payer: Self-pay | Admitting: Physical Medicine and Rehabilitation

## 2021-02-08 MED ORDER — GABAPENTIN 600 MG PO TABS: 600.0000 mg | ORAL_TABLET | Freq: Three times a day (TID) | ORAL | 3 refills | Status: DC

## 2021-02-10 ENCOUNTER — Ambulatory Visit: Payer: BC Managed Care – PPO | Admitting: Occupational Therapy

## 2021-02-10 ENCOUNTER — Encounter: Payer: Self-pay | Admitting: Occupational Therapy

## 2021-02-10 ENCOUNTER — Other Ambulatory Visit: Payer: Self-pay

## 2021-02-10 ENCOUNTER — Ambulatory Visit: Payer: BC Managed Care – PPO | Admitting: Physical Therapy

## 2021-02-10 VITALS — BP 140/53 | HR 73

## 2021-02-10 DIAGNOSIS — M25512 Pain in left shoulder: Secondary | ICD-10-CM | POA: Diagnosis not present

## 2021-02-10 DIAGNOSIS — R2689 Other abnormalities of gait and mobility: Secondary | ICD-10-CM | POA: Diagnosis not present

## 2021-02-10 DIAGNOSIS — R41842 Visuospatial deficit: Secondary | ICD-10-CM | POA: Diagnosis not present

## 2021-02-10 DIAGNOSIS — G8929 Other chronic pain: Secondary | ICD-10-CM | POA: Diagnosis not present

## 2021-02-10 DIAGNOSIS — M6281 Muscle weakness (generalized): Secondary | ICD-10-CM

## 2021-02-10 DIAGNOSIS — R2681 Unsteadiness on feet: Secondary | ICD-10-CM

## 2021-02-10 DIAGNOSIS — R278 Other lack of coordination: Secondary | ICD-10-CM | POA: Diagnosis not present

## 2021-02-10 DIAGNOSIS — I69354 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-dominant side: Secondary | ICD-10-CM

## 2021-02-10 DIAGNOSIS — I69154 Hemiplegia and hemiparesis following nontraumatic intracerebral hemorrhage affecting left non-dominant side: Secondary | ICD-10-CM | POA: Diagnosis not present

## 2021-02-10 NOTE — Therapy (Signed)
[facility] [address] [place], [place], [address] Phone: [phone]   Fax:  [phone]  Occupational Therapy Treatment & Recertification  Patient Details  Name: [name] MRN: [id number] Date of Birth: March 30, 1965 Referring Provider (OT): Sula Soda, MD   Encounter Date: 02/10/2021   OT End of Session - 02/10/21 1106     Visit Number 5    Number of Visits 13    Date for OT Re-Evaluation 04/21/21   8 additional visits in 10 weeks d/t any scheduling conflicts - updated at recert 02/10/21   Authorization Type BCBS    Authorization Time Period 1st 5 visits are at no charge, VL: PT/OT combined 30    Authorization - Number of Visits 15    OT Start Time 1104    OT Stop Time 1145    OT Time Calculation (min) 41 min    Activity Tolerance Patient tolerated treatment well    Behavior During Therapy [facility] for tasks assessed/performed             Past Medical History:  Diagnosis Date   Anxiety    Cataract    removed left eye   Chronic combined systolic and diastolic CHF (congestive heart failure) (HCC)    EF 25-30%   Diastolic dysfunction    Dyslipidemia    Hyperlipidemia    Hypertension    Neuromuscular disorder (HCC)    neuropathy left foot   Nonischemic dilated cardiomyopathy (HCC)    felt secondary to HTN with no ischemia on nuclear stress test 05/2012, EF 25-30%   Seizures (HCC)    last seizure 05-2020- on Keppra   Shortness of breath    Stroke (HCC) 05/2020    Past Surgical History:  Procedure Laterality Date   COLONOSCOPY WITH PROPOFOL N/A 12/09/2020   Procedure: COLONOSCOPY WITH PROPOFOL;  Surgeon: Benancio Deeds, MD;  Location: [facility];  Service: Gastroenterology;  Laterality: N/A;   KNEE ARTHROSCOPY Left 04/13/2016   Procedure: ARTHROSCOPY KNEE WITH DEBRIDEMENT;  Surgeon: Valeria Batman, MD;  Location: [facility];  Service: Orthopedics;  Laterality: Left;   KNEE  ARTHROSCOPY WITH MEDIAL MENISECTOMY Right 04/13/2016   Procedure: KNEE ARTHROSCOPY WITH MEDIAL MENISECTOMY;  Surgeon: Valeria Batman, MD;  Location:  SURGERY CENTER;  Service: Orthopedics;  Laterality: LEFT not right knee   NASAL FRACTURE SURGERY     SHOULDER SURGERY Left     There were no vitals filed for this visit.   Subjective Assessment - 02/10/21 1104     Subjective  "same ole thing this left foot doesn't wanna do anything"    Pertinent History hypertension, obesity, cardiomyopathy, hyperlipidemia, CHF    Limitations Fall Risk. Sensory Deficits LUE    Patient Stated Goals driving, cooking, walking, bathing himself    Currently in Pain? Yes    Pain Score 6     Pain Location Leg    Pain Orientation Left    Pain Descriptors / Indicators Tingling;Pins and needles    Pain Type Neuropathic pain    Pain Onset More than a month ago    Pain Frequency Constant                [facility] OT Assessment - 02/10/21 0001       Coordination   Box and Blocks L 9 blocks             Assessed goals for recertification.  Flipping JUMBO cards - with LUE with  max difficulty but complete with greater ease as the activity progressed with repetitions.                  OT Education - 02/10/21 1137     Education Details encouraged patient not to do the exercises given at different facility that are against gravity as patient has significant shoulder hike with any reaching.    Person(s) Educated Patient    Methods Explanation;Demonstration;Tactile cues;Verbal cues    Comprehension Verbalized understanding;Returned demonstration;Verbal cues required;Tactile cues required;Need further instruction              OT Short Term Goals - 02/10/21 1117       OT SHORT TERM GOAL #1   Title Pt will be independent with HEP targeting LUE range of motion and coordination    Time 4    Period Weeks    Status On-going   pt req'd review 02/10/21   Target Date 01/05/21       OT SHORT TERM GOAL #2   Title Pt will increase box and blocks score with LUE to 10 blocks or greater for increasing functional use of LUE.    Baseline R 40, L 7    Time 4    Period Weeks    Status On-going   9 blocks LUE 02/10/21     OT SHORT TERM GOAL #3   Title Pt will verbalize understanding of sensory safety strategies for increasing independence and safety    Time 4    Period Weeks    Status Achieved      OT SHORT TERM GOAL #4   Title Pt will perform simple warm meal prep and/or light housekeeping with supervision and incresed safety awareness d/t sensory deficits.    Time 4    Period Weeks    Status Achieved   pt reports doing his own laundry 02/10/21              OT Long Term Goals - 02/10/21 1111       OT LONG TERM GOAL #1   Title Pt will be independent with any updated HEPs    Time 8    Period Weeks    Status New    Target Date 04/21/21      OT LONG TERM GOAL #2   Title Pt will perform bathing with mod I and good safety awareness consistently, per report.    Time 8    Period Weeks    Status On-going   pt currently completing at son in laws - pt unable to use tub transfer bench in shower currently at home 02/10/21     OT LONG TERM GOAL #3   Title Pt will assist with at least one home management and/or meal prep task per day in order to increase independence and decrease caregiver burden.    Time 8    Period Weeks    Status On-going   doing laundry consistently and simple warm meals at home 02/10/21     OT LONG TERM GOAL #4   Title Pt will obtain a lightweight object from a low to mid level surface with LUE in order to increase ability to use LUE for functional reaching and use.    Time 8    Period Weeks    Status On-going                   Plan - 02/10/21 1121     Clinical Impression Statement Pt is  on visit 5 and this note serves as Lexicographer for remaining plan of care and continuing working towards unmet goals. Pt has progress with LUE  functional use and increased independence with ADLs and IADLs however continues to present with significant deficits with strength, coordination and sensation with LUE impeding overall independence. Skilled OT continues to be recommended.    OT Occupational Profile and History Problem Focused Assessment - Including review of records relating to presenting problem    Occupational performance deficits (Please refer to evaluation for details): ADL's;IADL's;Leisure    Body Structure / Function / Physical Skills ADL;GMC;FMC;Flexibility;Coordination;Sensation;IADL;ROM;UE functional use;Proprioception;Pain;Strength;Decreased knowledge of use of DME    Rehab Potential Good    Clinical Decision Making Limited treatment options, no task modification necessary    Comorbidities Affecting Occupational Performance: None    Modification or Assistance to Complete Evaluation  No modification of tasks or assist necessary to complete eval    OT Frequency 1x / week    OT Duration Other (comment)   8 visits over 10 weeks d/t any scheduling conflicts at recert - 02/10/21   OT Treatment/Interventions Self-care/ADL training;Aquatic Therapy;Patient/family education;Manual Therapy;Electrical Stimulation;Neuromuscular education;Functional Mobility Training;Passive range of motion;Visual/perceptual remediation/compensation;Therapeutic exercise;Ultrasound;Moist Heat;Fluidtherapy;DME and/or AE instruction;Therapeutic activities;Splinting    Plan LUE grasp and release and NMR, shoulder and scap stabilization LUE for pain management    Consulted and Agree with Plan of Care Patient;Family member/caregiver    Family Member Consulted spouse             Patient will benefit from skilled therapeutic intervention in order to improve the following deficits and impairments:   Body Structure / Function / Physical Skills: ADL, GMC, FMC, Flexibility, Coordination, Sensation, IADL, ROM, UE functional use, Proprioception, Pain, Strength,  Decreased knowledge of use of DME       Visit Diagnosis: 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-dominant side (HCC)  Muscle weakness (generalized)  Unsteadiness on feet  Other lack of coordination  Chronic left shoulder pain  Visuospatial deficit  Other abnormalities of gait and mobility    Problem List Patient Active Problem List   Diagnosis Date Noted   Colon cancer screening    Left hemiplegia (HCC) 07/02/2020   Cognitive deficit due to recent stroke 07/02/2020   Dysphagia, post-stroke    Vascular headache    Acute blood loss anemia    AKI (acute kidney injury) (HCC)    Essential hypertension    Seizures (HCC)    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   ICH (intracerebral hemorrhage) (HCC) 06/09/2020   Sprain of anterior talofibular ligament of left ankle 01/15/2020   Hyperlipidemia, on Lipitor 04/01/2017   Alcohol use disorder, mild, abuse 06/30/2016   Morbid obesity (HCC) 06/30/2016   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   Chronic combined systolic and diastolic CHF (congestive heart failure) (HCC) 11/08/2012   HTN (hypertension) 05/25/2012    Junious Dresser, OT 02/10/2021, 11:41 AM  [facility] [address] [place], [place], [address] Phone: [phone]   Fax:  [phone]  Name: [name] MRN: [id number] Date of Birth: [birth year]

## 2021-02-11 ENCOUNTER — Other Ambulatory Visit: Payer: Self-pay | Admitting: Family

## 2021-02-11 ENCOUNTER — Other Ambulatory Visit: Payer: Self-pay | Admitting: Physical Medicine and Rehabilitation

## 2021-02-11 DIAGNOSIS — R569 Unspecified convulsions: Secondary | ICD-10-CM

## 2021-02-11 DIAGNOSIS — G441 Vascular headache, not elsewhere classified: Secondary | ICD-10-CM

## 2021-02-11 DIAGNOSIS — I10 Essential (primary) hypertension: Secondary | ICD-10-CM

## 2021-02-11 MED ORDER — GABAPENTIN 300 MG PO CAPS: 300.0000 mg | ORAL_CAPSULE | Freq: Three times a day (TID) | ORAL | 11 refills | Status: DC

## 2021-02-11 NOTE — Therapy (Signed)
[facility] [address] [place], [place], [address] Phone: [phone]   Fax:  [phone]  Physical Therapy Treatment  Patient Details  Name: [name] MRN: [id number] Date of Birth: [birth year] Referring Provider (PT): Leeroy Cha, MD   Encounter Date: 02/10/2021   PT End of Session - 02/11/21 1100     Visit Number 6   visit 2 since 01-23-21   Number of Visits 15   1st 5 visits are at no charge; 30 PT/OT combined   Date for PT Re-Evaluation 02/04/21    Authorization Type BCBS. 30 visits PT/OT combined, each dicipline counts as 1 visit    Authorization - Visit Number 4   PT and OT visits since 01-23-21   Authorization - Number of Visits 30    PT Start Time 8185    PT Stop Time 1100    PT Time Calculation (min) 46 min    Equipment Utilized During Treatment Gait belt    Activity Tolerance Patient tolerated treatment well    Behavior During Therapy WFL for tasks assessed/performed             Past Medical History:  Diagnosis Date   Anxiety    Cataract    removed left eye   Chronic combined systolic and diastolic CHF (congestive heart failure) (HCC)    EF 63-14%   Diastolic dysfunction    Dyslipidemia    Hyperlipidemia    Hypertension    Neuromuscular disorder (HCC)    neuropathy left foot   Nonischemic dilated cardiomyopathy ([place])    felt secondary to HTN with no ischemia on nuclear stress test 05/2012, EF 25-30%   Seizures ([place])    last seizure 05-2020- on Keppra   Shortness of breath    Stroke ([place]) 05/2020    Past Surgical History:  Procedure Laterality Date   COLONOSCOPY WITH PROPOFOL N/A 12/09/2020   Procedure: COLONOSCOPY WITH PROPOFOL;  Surgeon: Yetta Flock, MD;  Location: [facility];  Service: Gastroenterology;  Laterality: N/A;   KNEE ARTHROSCOPY Left 04/13/2016   Procedure: ARTHROSCOPY KNEE WITH DEBRIDEMENT;  Surgeon: Garald Balding, MD;  Location: [facility];  Service: Orthopedics;  Laterality: Left;   KNEE ARTHROSCOPY WITH MEDIAL MENISECTOMY Right 04/13/2016   Procedure: KNEE ARTHROSCOPY WITH MEDIAL MENISECTOMY;  Surgeon: Garald Balding, MD;  Location: [facility];  Service: Orthopedics;  Laterality: LEFT not right knee   NASAL FRACTURE SURGERY     SHOULDER SURGERY Left     Vitals:   02/10/21 1014  BP: (!) 140/53  Pulse: 73     Subjective Assessment - 02/10/21 1015     Subjective Pt reports he missed the last PT session because the neuropathy in his legs was worse - stated he had trouble standing up "my legs didn't want to work" - says transportation left him as he was told they only have a 5" window;  pt reports he is concerned about the swelling in his left leg    Pertinent History HTN, cardiomyopathy, chronic combined systolic & diastolic CHF, alcohol use disorder, seizures    Currently in Pain? Yes    Pain Score 6     Pain Location Leg   distal to knee   Pain Orientation Left    Pain Descriptors / Indicators Tingling;Pins and needles    Pain Type Neuropathic pain    Pain Onset More than a month ago    Pain Frequency Intermittent  [facility] PT Assessment - 02/11/21 0001       Berg Balance Test   Sit to Stand Able to stand without using hands and stabilize independently    Standing Unsupported Able to stand safely 2 minutes   with most weight on right side   Sitting with Back Unsupported but Feet Supported on Floor or Stool Able to sit safely and securely 2 minutes    Stand to Sit Sits safely with minimal use of hands    Transfers Able to transfer safely, minor use of hands    Standing Unsupported with Eyes Closed Able to stand 10 seconds safely    Standing Unsupported with Feet Together Able to place feet together independently and stand 1 minute safely    From Standing, Reach Forward with Outstretched Arm Can reach confidently >25 cm (10")   8 inches   From Standing Position, Pick up  Object from Floor Able to pick up shoe safely and easily    From Standing Position, Turn to Look Behind Over each Shoulder Looks behind one side only/other side shows less weight shift   left>right   Turn 360 Degrees Able to turn 360 degrees safely but slowly   > 6 seconds   Standing Unsupported, Alternately Place Feet on Step/Stool Able to complete >2 steps/needs minimal assist    Standing Unsupported, One Foot in Front Able to take small step independently and hold 30 seconds    Standing on One Leg Able to lift leg independently and hold equal to or more than 3 seconds    Total Score 46    Berg comment: 37-45 significant risk             Gait velocity 1.22 ft/sec with hemiwalker (26.78 secs)  TUG score 29.53 secs with hemiwalker              [facility] Adult PT Treatment/Exercise - 02/11/21 0001       Transfers   Transfers Sit to Stand;Stand to Sit    Sit to Stand 5: Supervision      Ambulation/Gait   Ambulation/Gait Yes    Ambulation/Gait Assistance 4: Min guard    Ambulation Distance (Feet) 115 Feet    Assistive device Small based quad cane    Gait Pattern Decreased step length - left;Decreased arm swing - left;Decreased stance time - left;Decreased hip/knee flexion - left;Decreased dorsiflexion - left;Decreased weight shift to left    Ambulation Surface Level;Indoor      Knee/Hip Exercises: Standing   Other Standing Knee Exercises tap ups to 6" step with min assist - with RLE for increased LLE weight bearing - pt performed 10 times with RLE                       PT Short Term Goals - 02/10/21 1021       PT SHORT TERM GOAL #1   Title Pt will improve Berg balance test score by at least 4 points to demo improvement in balance.  Increase Berg score to >/= 49/56 (Target date for all STG's 03-11-21)    Baseline 01/06/21: initial score set this date is 40/56;  46/56 on 02-10-21    Time 4    Period Weeks    Status New    Target Date 03/11/21      PT  SHORT TERM GOAL #2   Title Improve TUG score from 38.87 secs to </= 33 secs with use of hemiwalker to demo improvement in functional  mobility.; Upgraded STG - TUG score </= 26 secs with hemiwalker    Baseline 01/06/21: unable to reassess due to time contraints, 38.87 sec's at baseline;  29.53 secs on 02-10-21 with hemiwalker    Time 4    Period Weeks    Status New    Target Date 03/11/21      PT SHORT TERM GOAL #3   Title Amb. with SBQC 230' with CGA on flat, even surface for increased community accessibility.;  Amb. 51' with SBQC with SBA    Baseline 350' with SBQC on 02-10-21    Time 4    Period Weeks    Status New    Target Date 03/11/21      PT SHORT TERM GOAL #4   Title Assess foot up brace vs. AFO for LLE for safety with amb.    Baseline --    Time 4    Period Weeks    Status New    Target Date 03/11/21               PT Long Term Goals - 02/10/21 1021       PT LONG TERM GOAL #1   Title Improve Berg balance test score by at least 8 points for reduced fall risk.  Increase Berg score to >/= 52/56 for reduced fall risk.    Baseline improved 6 points from 40/56 on 01-06-21 to 46/56 on 02-10-21    Time 8    Period Weeks    Status New    Target Date 04/08/21      PT LONG TERM GOAL #2   Title Increase gait velocity from .68 ft/sec to >/= 1.4 ft/sec with hemiwalker for increased gait efficiency.  Upgraded LTG - increase gait speed to >/= 1.6 ft/sec with [facility]    Baseline 47.78 secs = .68 ft/sec;     02-10-21:   26.78 secs = 1.22 ft/sec with hemiwalker    Time 8    Period Weeks    Status New    Target Date 04/08/21      PT LONG TERM GOAL #3   Title Improve TUG score to </= 30 secs with hemiwalker to reduce fall risk and to demo incr. functional mobility. Upgraded LTG - score </=24 secs with SBQC    Baseline 38.87 secs with hemiwalker;   02-10-21--  29.53 secs with hemiwalker    Time 8    Period Weeks    Status New    Target Date 04/08/21      PT LONG TERM GOAL #4    Title Amb. 30' without use of assistive device with SBA for household amb. Upgraded goal - modified independent household amb. without device    Baseline hemiwalker - 94' with SBA to CGA;  30' with CGA as Lt foot does not clear floor occasionally    Time 8    Period Weeks    Status New    Target Date 04/08/21      PT LONG TERM GOAL #5   Title Amb. 500' with SPC with SBA for incr. community accessibility. Upgraded LTG - amb. 550' with SBQC on flat, even surface with SBA    Baseline 350' with SBQC on 02-10-21    Time 8    Period Weeks    Status New    Target Date 04/08/21      PT LONG TERM GOAL #6   Title Perform floor to stand transfer with UE support on object with SBA.  Time 8    Period Weeks    Status On-going    Target Date 04/08/21                   Plan - 02/10/21 1021     Clinical Impression Statement PT session focused on assessment of LTG's as pt is at end of certification period of 8 weeks even though pt has only attended 5 PT sessions since initial eval 8 weeks ago.  Pt has partially met LTG #1 with Berg score increasing from 40/56 to 46/56. Goal not met as set for 8 point increase.  LTG #2 partially met as gait velocity = 1.22 ft/sec, increased from .68 ft/sec but not to 1.4 ft/sec per stated LTG.  LTG #3 met as TUG score = 29.53 secs; LTG #4 partially met as pt able to amb. 52' without device with CGA for safety due to occasional incomplete clearance of Lt foot.  LTG #5 (floor transferred ongoing) due to time constraint.  An AFO continues to be recommended for safety with amb. however, pt continues to decline brace at this time.  Cont with POC - recertification for additional 8 weeks.    Personal Factors and Comorbidities Behavior Pattern;Comorbidity 2;Profession;Transportation    Comorbidities cardiomyopathy, systolic & diastolic CHF, ICH, seizures    Examination-Activity Limitations Bend;Carry;Lift;Stand;Stairs;Squat;Locomotion Level;Transfers;Sleep;Bed Mobility     Examination-Participation Restrictions Cleaning;Community Activity;Driving;Shop;Laundry;Occupation;Meal Prep;Yard Work    [employer] Evolving/Moderate complexity    Rehab Potential Good    PT Frequency 2x / week    PT Duration 8 weeks    PT Treatment/Interventions ADLs/Self Care Home Management;Aquatic Therapy;DME Instruction;Gait training;Stair training;Therapeutic activities;Therapeutic exercise;Balance training;Orthotic Fit/Training;Patient/family education;Neuromuscular re-education    PT Next Visit Plan added additional ex's to HEP; continue to work on strengthening and balance. trial AFO on left LE    PT Home Exercise Plan Access Code: [email or web address]    Consulted and Agree with Plan of Care Patient;Family member/caregiver    Family Member Consulted wife [name]             Patient will benefit from skilled therapeutic intervention in order to improve the following deficits and impairments:  Abnormal gait, Decreased activity tolerance, Decreased balance, Decreased coordination, Decreased strength, Impaired tone, Impaired sensation, Impaired UE functional use, Impaired vision/preception, Pain  Visit Diagnosis: 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-dominant side (HCC) - Plan: PT plan of care cert/re-cert  Unsteadiness on feet - Plan: PT plan of care cert/re-cert  Muscle weakness (generalized) - Plan: PT plan of care cert/re-cert  Other abnormalities of gait and mobility - Plan: PT plan of care cert/re-cert     Problem List Patient Active Problem List   Diagnosis Date Noted   Colon cancer screening    Left hemiplegia ([place]) 07/02/2020   Cognitive deficit due to recent stroke 07/02/2020   Dysphagia, post-stroke    Vascular headache    Acute blood loss anemia    AKI (acute kidney injury) ([place])    Essential hypertension    Seizures ([place])    Thalamic hemorrhage ([place]) 06/22/2020   ICH (intracerebral hemorrhage) ([place])  06/09/2020   Sprain of anterior talofibular ligament of left ankle 01/15/2020   Hyperlipidemia, on Lipitor 04/01/2017   Alcohol use disorder, mild, abuse 06/30/2016   Morbid obesity ([place]) 06/30/2016   Nonischemic dilated cardiomyopathy ([place]) 11/08/2012   Chronic combined systolic and diastolic CHF (congestive heart failure) ([place]) 11/08/2012   HTN (hypertension) 05/25/2012    Charlisha Market, [place], PT 02/11/2021, 11:23  [facility] [address] [place], [place], [address] Phone: [phone]   Fax:  [phone]  Name: [name] MRN: [id number] Date of Birth: 1965-07-13

## 2021-02-14 ENCOUNTER — Other Ambulatory Visit (HOSPITAL_COMMUNITY): Payer: Self-pay

## 2021-02-17 ENCOUNTER — Encounter: Payer: Self-pay | Admitting: Occupational Therapy

## 2021-02-17 ENCOUNTER — Other Ambulatory Visit: Payer: Self-pay

## 2021-02-17 ENCOUNTER — Ambulatory Visit: Payer: BC Managed Care – PPO | Admitting: Physical Therapy

## 2021-02-17 ENCOUNTER — Ambulatory Visit: Payer: BC Managed Care – PPO | Admitting: Occupational Therapy

## 2021-02-17 DIAGNOSIS — M25512 Pain in left shoulder: Secondary | ICD-10-CM | POA: Diagnosis not present

## 2021-02-17 DIAGNOSIS — R2681 Unsteadiness on feet: Secondary | ICD-10-CM

## 2021-02-17 DIAGNOSIS — I69154 Hemiplegia and hemiparesis following nontraumatic intracerebral hemorrhage affecting left non-dominant side: Secondary | ICD-10-CM | POA: Diagnosis not present

## 2021-02-17 DIAGNOSIS — R278 Other lack of coordination: Secondary | ICD-10-CM

## 2021-02-17 DIAGNOSIS — R2689 Other abnormalities of gait and mobility: Secondary | ICD-10-CM | POA: Diagnosis not present

## 2021-02-17 DIAGNOSIS — G8929 Other chronic pain: Secondary | ICD-10-CM

## 2021-02-17 DIAGNOSIS — I69354 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-dominant side: Secondary | ICD-10-CM | POA: Diagnosis not present

## 2021-02-17 DIAGNOSIS — M6281 Muscle weakness (generalized): Secondary | ICD-10-CM

## 2021-02-17 DIAGNOSIS — R41842 Visuospatial deficit: Secondary | ICD-10-CM | POA: Diagnosis not present

## 2021-02-17 NOTE — Therapy (Signed)
[facility] [address] [place], [place], [address] Phone: [phone]   Fax:  [phone]  Occupational Therapy Treatment  Patient Details  Name: [name] MRN: [id number] Date of Birth: May 19, 1965 Referring Provider (OT): Sula Soda, MD   Encounter Date: 02/17/2021   OT End of Session - 02/17/21 0934     Visit Number 6    Number of Visits 13    Date for OT Re-Evaluation 04/21/21   8 additional visits in 10 weeks d/t any scheduling conflicts - updated at recert 02/10/21   Authorization Type BCBS    Authorization Time Period 1st 5 visits are at no charge, VL: PT/OT combined 30    Authorization - Number of Visits 15    OT Start Time 0935    OT Stop Time 1015    OT Time Calculation (min) 40 min    Activity Tolerance Patient tolerated treatment well    Behavior During Therapy [facility] for tasks assessed/performed             Past Medical History:  Diagnosis Date   Anxiety    Cataract    removed left eye   Chronic combined systolic and diastolic CHF (congestive heart failure) (HCC)    EF 25-30%   Diastolic dysfunction    Dyslipidemia    Hyperlipidemia    Hypertension    Neuromuscular disorder (HCC)    neuropathy left foot   Nonischemic dilated cardiomyopathy (HCC)    felt secondary to HTN with no ischemia on nuclear stress test 05/2012, EF 25-30%   Seizures (HCC)    last seizure 05-2020- on Keppra   Shortness of breath    Stroke (HCC) 05/2020    Past Surgical History:  Procedure Laterality Date   COLONOSCOPY WITH PROPOFOL N/A 12/09/2020   Procedure: COLONOSCOPY WITH PROPOFOL;  Surgeon: Benancio Deeds, MD;  Location: [facility];  Service: Gastroenterology;  Laterality: N/A;   KNEE ARTHROSCOPY Left 04/13/2016   Procedure: ARTHROSCOPY KNEE WITH DEBRIDEMENT;  Surgeon: Valeria Batman, MD;  Location: [facility];  Service: Orthopedics;  Laterality: Left;   KNEE ARTHROSCOPY WITH  MEDIAL MENISECTOMY Right 04/13/2016   Procedure: KNEE ARTHROSCOPY WITH MEDIAL MENISECTOMY;  Surgeon: Valeria Batman, MD;  Location: [facility];  Service: Orthopedics;  Laterality: LEFT not right knee   NASAL FRACTURE SURGERY     SHOULDER SURGERY Left     There were no vitals filed for this visit.   Subjective Assessment - 02/17/21 0937     Subjective  "i guess your not the one that's gonna show my how to get up off the floor" - "i burned up my pants trying to iron them"    Pertinent History hypertension, obesity, cardiomyopathy, hyperlipidemia, CHF    Limitations Fall Risk. Sensory Deficits LUE    Patient Stated Goals driving, cooking, walking, bathing himself    Currently in Pain? Yes    Pain Score 6     Pain Location Leg    Pain Orientation Left    Pain Descriptors / Indicators Tingling;Pins and needles    Pain Type Neuropathic pain    Pain Onset More than a month ago    Pain Frequency Constant    Aggravating Factors  neuropathy                          OT Treatments/Exercises (OP) - 02/17/21 0940       ADLs  Home Maintenance pt reported trying to use the iron this morning - OT advised patient against using iron without supervision as patient has poor proprioception and sensation in LUE and is at risk for burning and injuring self on LUE. Pt verbalized understanding of this. Suggests only using iron with spouse present.      Neurological Re-education Exercises   Other Exercises 1 supine PVC pipe frame chest press, shoulder flexion, horizontal abduction x 10. max tactile and verbal cues for malpositioning and movement patterns with LUE.    Other Exercises 2 mod cues and discussion about movement patterns with LUE Encouraged patient to only complete exercises with gravity eliminated (i.e. to floor, supine closed chair, reaching on table) in order to not encourage poor movement patterns iwth LUE and copmensatory movements    Other Weight-Bearing  Exercises 1 into forearm and pushing up off of LUE hand and elbow extnesion x 10 with min A for managing hand position                      OT Short Term Goals - 02/10/21 1117       OT SHORT TERM GOAL #1   Title Pt will be independent with HEP targeting LUE range of motion and coordination    Time 4    Period Weeks    Status On-going   pt req'd review 02/10/21   Target Date 01/05/21      OT SHORT TERM GOAL #2   Title Pt will increase box and blocks score with LUE to 10 blocks or greater for increasing functional use of LUE.    Baseline R 40, L 7    Time 4    Period Weeks    Status On-going   9 blocks LUE 02/10/21     OT SHORT TERM GOAL #3   Title Pt will verbalize understanding of sensory safety strategies for increasing independence and safety    Time 4    Period Weeks    Status Achieved      OT SHORT TERM GOAL #4   Title Pt will perform simple warm meal prep and/or light housekeeping with supervision and incresed safety awareness d/t sensory deficits.    Time 4    Period Weeks    Status Achieved   pt reports doing his own laundry 02/10/21              OT Long Term Goals - 02/10/21 1111       OT LONG TERM GOAL #1   Title Pt will be independent with any updated HEPs    Time 8    Period Weeks    Status New    Target Date 04/21/21      OT LONG TERM GOAL #2   Title Pt will perform bathing with mod I and good safety awareness consistently, per report.    Time 8    Period Weeks    Status On-going   pt currently completing at son in laws - pt unable to use tub transfer bench in shower currently at home 02/10/21     OT LONG TERM GOAL #3   Title Pt will assist with at least one home management and/or meal prep task per day in order to increase independence and decrease caregiver burden.    Time 8    Period Weeks    Status On-going   doing laundry consistently and simple warm meals at home 02/10/21     OT LONG  TERM GOAL #4   Title Pt will obtain a  lightweight object from a low to mid level surface with LUE in order to increase ability to use LUE for functional reaching and use.    Time 8    Period Weeks    Status On-going                   Plan - 02/17/21 1048     Clinical Impression Statement Pt continues to present with mal positioning and movement patterns with LUE and continues to require cues for not completing movement against gravity at this time. Pt with decreased shoulder pain iwth movement.    OT Occupational Profile and History Problem Focused Assessment - Including review of records relating to presenting problem    Occupational performance deficits (Please refer to evaluation for details): ADL's;IADL's;Leisure    Body Structure / Function / Physical Skills ADL;GMC;FMC;Flexibility;Coordination;Sensation;IADL;ROM;UE functional use;Proprioception;Pain;Strength;Decreased knowledge of use of DME    Rehab Potential Good    Clinical Decision Making Limited treatment options, no task modification necessary    Comorbidities Affecting Occupational Performance: None    Modification or Assistance to Complete Evaluation  No modification of tasks or assist necessary to complete eval    OT Frequency 1x / week    OT Duration Other (comment)   8 visits over 10 weeks d/t any scheduling conflicts at recert - 02/10/21   OT Treatment/Interventions Self-care/ADL training;Aquatic Therapy;Patient/family education;Manual Therapy;Electrical Stimulation;Neuromuscular education;Functional Mobility Training;Passive range of motion;Visual/perceptual remediation/compensation;Therapeutic exercise;Ultrasound;Moist Heat;Fluidtherapy;DME and/or AE instruction;Therapeutic activities;Splinting    Plan LUE NMR, LUE grasp and release and functional use    Consulted and Agree with Plan of Care Patient;Family member/caregiver    Family Member Consulted spouse             Patient will benefit from skilled therapeutic intervention in order to improve  the following deficits and impairments:   Body Structure / Function / Physical Skills: ADL, GMC, FMC, Flexibility, Coordination, Sensation, IADL, ROM, UE functional use, Proprioception, Pain, Strength, Decreased knowledge of use of DME       Visit Diagnosis: 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-dominant side (HCC)  Muscle weakness (generalized)  Other lack of coordination  Unsteadiness on feet  Chronic left shoulder pain  Visuospatial deficit    Problem List Patient Active Problem List   Diagnosis Date Noted   Colon cancer screening    Left hemiplegia (HCC) 07/02/2020   Cognitive deficit due to recent stroke 07/02/2020   Dysphagia, post-stroke    Vascular headache    Acute blood loss anemia    AKI (acute kidney injury) (HCC)    Essential hypertension    Seizures (HCC)    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   ICH (intracerebral hemorrhage) (HCC) 06/09/2020   Sprain of anterior talofibular ligament of left ankle 01/15/2020   Hyperlipidemia, on Lipitor 04/01/2017   Alcohol use disorder, mild, abuse 06/30/2016   Morbid obesity (HCC) 06/30/2016   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   Chronic combined systolic and diastolic CHF (congestive heart failure) (HCC) 11/08/2012   HTN (hypertension) 05/25/2012    Junious Dresser, OT 02/17/2021, 10:50 AM  [facility] [address] [place], [place], [address] Phone: [phone]   Fax:  [phone]  Name: [name] MRN: [id number] Date of Birth: [birth year]

## 2021-02-18 ENCOUNTER — Encounter: Payer: Self-pay | Admitting: Physical Therapy

## 2021-02-18 DIAGNOSIS — I610 Nontraumatic intracerebral hemorrhage in hemisphere, subcortical: Secondary | ICD-10-CM | POA: Diagnosis not present

## 2021-02-18 DIAGNOSIS — I5042 Chronic combined systolic (congestive) and diastolic (congestive) heart failure: Secondary | ICD-10-CM | POA: Diagnosis not present

## 2021-02-18 DIAGNOSIS — R569 Unspecified convulsions: Secondary | ICD-10-CM | POA: Diagnosis not present

## 2021-02-18 NOTE — Therapy (Signed)
[place] [address] [place], [place], [address] Phone: [phone]   Fax:  [phone]  Physical Therapy Treatment  Patient Details  Name: [name] MRN: [id number] Date of Birth: 1965-03-06 Referring Provider (PT): Leeroy Cha, MD   Encounter Date: 02/17/2021   PT End of Session - 02/18/21 1805     Visit Number 7    Number of Visits 15   1st 5 visits are at no charge; 30 PT/OT combined   Date for PT Re-Evaluation 04/08/21    Authorization Type BCBS. 30 visits PT/OT combined, each dicipline counts as 1 visit    Authorization - Visit Number 6   PT and OT visits since 01-23-21   Authorization - Number of Visits 41    PT Start Time 1017    PT Stop Time [id number]    PT Time Calculation (min) 46 min    Equipment Utilized During Treatment Gait belt    Activity Tolerance Patient tolerated treatment well    Behavior During Therapy WFL for tasks assessed/performed             Past Medical History:  Diagnosis Date   Anxiety    Cataract    removed left eye   Chronic combined systolic and diastolic CHF (congestive heart failure) (HCC)    EF [id number]   Diastolic dysfunction    Dyslipidemia    Hyperlipidemia    Hypertension    Neuromuscular disorder (HCC)    neuropathy left foot   Nonischemic dilated cardiomyopathy ([place])    felt secondary to HTN with no ischemia on nuclear stress test 05/2012, EF 25-30%   Seizures ([place])    last seizure 05-2020- on Keppra   Shortness of breath    Stroke ([place]) 05/2020    Past Surgical History:  Procedure Laterality Date   COLONOSCOPY WITH PROPOFOL N/A 12/09/2020   Procedure: COLONOSCOPY WITH PROPOFOL;  Surgeon: Yetta Flock, MD;  Location: [facility];  Service: Gastroenterology;  Laterality: N/A;   KNEE ARTHROSCOPY Left 04/13/2016   Procedure: ARTHROSCOPY KNEE WITH DEBRIDEMENT;  Surgeon: Garald Balding, MD;  Location: [facility];  Service:  Orthopedics;  Laterality: Left;   KNEE ARTHROSCOPY WITH MEDIAL MENISECTOMY Right 04/13/2016   Procedure: KNEE ARTHROSCOPY WITH MEDIAL MENISECTOMY;  Surgeon: Garald Balding, MD;  Location: [facility];  Service: Orthopedics;  Laterality: LEFT not right knee   NASAL FRACTURE SURGERY     SHOULDER SURGERY Left     There were no vitals filed for this visit.   Subjective Assessment - 02/18/21 1741     Subjective Pt using SBQC for assistance with ambulation today - is not using hemiwalker as he usually does - states "I knew who was picking me today"    Pertinent History HTN, cardiomyopathy, chronic combined systolic & diastolic CHF, alcohol use disorder, seizures    Currently in Pain? Yes    Pain Score 5     Pain Location Leg    Pain Orientation Left    Pain Descriptors / Indicators Tingling;Pins and needles    Pain Type Neuropathic pain    Pain Onset More than a month ago    Pain Frequency Constant                               [facility] Adult PT Treatment/Exercise - 02/18/21 0001       Transfers   Transfers Sit  to Stand;Stand to Sit    Sit to Stand 4: Min assist    Stand to Sit 5: Supervision    Comments Rt foot placed on balance bubble for increased LLE weight bearing and strengthening      Ambulation/Gait   Ambulation/Gait Yes    Ambulation/Gait Assistance 4: Min guard    Ambulation Distance (Feet) 230 Feet   230' with foot up  orthosis; 230' with Ottobock Walk on AFO LLE   Assistive device Small based quad cane    Gait Pattern Decreased step length - left;Decreased arm swing - left;Decreased stance time - left;Decreased hip/knee flexion - left;Decreased dorsiflexion - left;Decreased weight shift to left    Ambulation Surface Level;Indoor    Gait Comments used foot up brace for 1st 2 laps (230'); then used Ottobock Walk on AFO on LLE for 2 laps      Exercises   Exercises Knee/Hip      Knee/Hip Exercises: Stretches   Other Knee/Hip Stretches  Passive Lt hip internal rotation with knee extended - pt sitting on side of mat table - 3 reps 15 sec hold each      Knee/Hip Exercises: Machines for Strengthening   Cybex Leg Press LLE only 40# 3 sets 10 reps      Knee/Hip Exercises: Standing   Other Standing Knee Exercises tap ups to 4" step (placed by mat) with UE support on SBQC with min assist            NeuroRe-ed: Pt stood with Rt foot on 4" step - Lt foot on floor for increased weight bearing - performed 3 taps slowly for RLE closed Chain strengthening and RLE weight bearing           PT Short Term Goals - 02/18/21 1812       PT SHORT TERM GOAL #1   Title Pt will improve Berg balance test score by at least 4 points to demo improvement in balance.  Increase Berg score to >/= 49/56 (Target date for all STG's 03-11-21)    Baseline 01/06/21: initial score set this date is 40/56;  46/56 on 02-10-21    Time 4    Period Weeks    Status New    Target Date 03/11/21      PT SHORT TERM GOAL #2   Title Improve TUG score from 38.87 secs to </= 33 secs with use of hemiwalker to demo improvement in functional mobility.; Upgraded STG - TUG score </= 26 secs with hemiwalker    Baseline 01/06/21: unable to reassess due to time contraints, 38.87 sec's at baseline;  29.53 secs on 02-10-21 with hemiwalker    Time 4    Period Weeks    Status New    Target Date 03/11/21      PT SHORT TERM GOAL #3   Title Amb. with SBQC 230' with CGA on flat, even surface for increased community accessibility.;  Amb. 51' with SBQC with SBA    Baseline 350' with SBQC on 02-10-21    Time 4    Period Weeks    Status New    Target Date 03/11/21      PT SHORT TERM GOAL #4   Title Assess foot up brace vs. AFO for LLE for safety with amb.    Time 4    Period Weeks    Status New    Target Date 03/11/21               PT Long  Term Goals - 02/18/21 1812       PT LONG TERM GOAL #1   Title Improve Berg balance test score by at least 8 points for  reduced fall risk.  Increase Berg score to >/= 52/56 for reduced fall risk.    Baseline improved 6 points from 40/56 on 01-06-21 to 46/56 on 02-10-21    Time 8    Period Weeks    Status New    Target Date 04/08/21      PT LONG TERM GOAL #2   Title Increase gait velocity from .68 ft/sec to >/= 1.4 ft/sec with hemiwalker for increased gait efficiency.  Upgraded LTG - increase gait speed to >/= 1.6 ft/sec with [facility]    Baseline 47.78 secs = .68 ft/sec;     02-10-21:   26.78 secs = 1.22 ft/sec with hemiwalker    Time 8    Period Weeks    Status New    Target Date 04/08/21      PT LONG TERM GOAL #3   Title Improve TUG score to </= 30 secs with hemiwalker to reduce fall risk and to demo incr. functional mobility. Upgraded LTG - score </=24 secs with SBQC    Baseline 38.87 secs with hemiwalker;   02-10-21--  29.53 secs with hemiwalker    Time 8    Period Weeks    Status New    Target Date 04/08/21      PT LONG TERM GOAL #4   Title Amb. 30' without use of assistive device with SBA for household amb. Upgraded goal - modified independent household amb. without device    Baseline hemiwalker - 67' with SBA to CGA;  30' with CGA as Lt foot does not clear floor occasionally    Time 8    Period Weeks    Status New    Target Date 04/08/21      PT LONG TERM GOAL #5   Title Amb. 500' with SPC with SBA for incr. community accessibility. Upgraded LTG - amb. 550' with SBQC on flat, even surface with SBA    Baseline 350' with SBQC on 02-10-21    Time 8    Period Weeks    Status New    Target Date 04/08/21      PT LONG TERM GOAL #6   Title Perform floor to stand transfer with UE support on object with SBA.    Time 8    Period Weeks    Status On-going    Target Date 04/08/21                   Plan - 02/18/21 1807     Clinical Impression Statement Pt's gait improved with use of Ottobock Walk on AFO on LLE, even though pt did have 1 occurrence of Lt toes catching but pt able to self  recover and did not have LOB.  Foot up brace was trialed prior to use of AFO and was determined to be of minimal benefit.  Pt agreed to obtaining AFO to have to use with prolonged distance ambulation.  Cont with POC.    Personal Factors and Comorbidities Behavior Pattern;Comorbidity 2;Profession;Transportation    Comorbidities cardiomyopathy, systolic & diastolic CHF, ICH, seizures    Examination-Activity Limitations Bend;Carry;Lift;Stand;Stairs;Squat;Locomotion Level;Transfers;Sleep;Bed Mobility    Examination-Participation Restrictions Cleaning;Community Activity;Driving;Shop;Laundry;Occupation;Meal Prep;Yard Work    Stability/Clinical Decision Making Evolving/Moderate complexity    Rehab Potential Good    PT Frequency 2x / week    PT Duration  8 weeks    PT Treatment/Interventions ADLs/Self Care Home Management;Aquatic Therapy;DME Instruction;Gait training;Stair training;Therapeutic activities;Therapeutic exercise;Balance training;Orthotic Fit/Training;Patient/family education;Neuromuscular re-education    PT Next Visit Plan added additional ex's to HEP; continue to work on strengthening and balance. trial AFO on left LE    PT Home Exercise Plan Access Code: [email or web address]    Consulted and Agree with Plan of Care Patient;Family member/caregiver    Family Member Consulted wife [name]             Patient will benefit from skilled therapeutic intervention in order to improve the following deficits and impairments:  Abnormal gait, Decreased activity tolerance, Decreased balance, Decreased coordination, Decreased strength, Impaired tone, Impaired sensation, Impaired UE functional use, Impaired vision/preception, Pain  Visit Diagnosis: 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-dominant side (HCC)  Muscle weakness (generalized)  Unsteadiness on feet  Other abnormalities of gait and mobility     Problem List Patient Active Problem List   Diagnosis Date Noted    Colon cancer screening    Left hemiplegia ([place]) 07/02/2020   Cognitive deficit due to recent stroke 07/02/2020   Dysphagia, post-stroke    Vascular headache    Acute blood loss anemia    AKI (acute kidney injury) ([place])    Essential hypertension    Seizures ([place])    Thalamic hemorrhage ([place]) 06/22/2020   ICH (intracerebral hemorrhage) ([place]) 06/09/2020   Sprain of anterior talofibular ligament of left ankle 01/15/2020   Hyperlipidemia, on Lipitor 04/01/2017   Alcohol use disorder, mild, abuse 06/30/2016   Morbid obesity ([place]) 06/30/2016   Nonischemic dilated cardiomyopathy ([place]) 11/08/2012   Chronic combined systolic and diastolic CHF (congestive heart failure) ([place]) 11/08/2012   HTN (hypertension) 05/25/2012    Mozell Haber, [place], PT 02/18/2021, 6:17 PM  [place] [address] [place], [place], [address] Phone: [phone]   Fax:  [phone]  Name: [name] MRN: [id number] Date of Birth: 24-Dec-1965

## 2021-02-24 ENCOUNTER — Encounter: Payer: Self-pay | Admitting: Occupational Therapy

## 2021-02-24 ENCOUNTER — Ambulatory Visit: Payer: BC Managed Care – PPO | Admitting: Occupational Therapy

## 2021-02-24 ENCOUNTER — Ambulatory Visit: Payer: BC Managed Care – PPO | Attending: Family | Admitting: Physical Therapy

## 2021-02-24 ENCOUNTER — Other Ambulatory Visit: Payer: Self-pay

## 2021-02-24 VITALS — BP 159/74 | HR 69

## 2021-02-24 DIAGNOSIS — I69354 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-dominant side: Secondary | ICD-10-CM | POA: Diagnosis not present

## 2021-02-24 DIAGNOSIS — G8929 Other chronic pain: Secondary | ICD-10-CM | POA: Diagnosis not present

## 2021-02-24 DIAGNOSIS — R41842 Visuospatial deficit: Secondary | ICD-10-CM

## 2021-02-24 DIAGNOSIS — R2689 Other abnormalities of gait and mobility: Secondary | ICD-10-CM | POA: Insufficient documentation

## 2021-02-24 DIAGNOSIS — R278 Other lack of coordination: Secondary | ICD-10-CM | POA: Diagnosis not present

## 2021-02-24 DIAGNOSIS — R2681 Unsteadiness on feet: Secondary | ICD-10-CM | POA: Diagnosis not present

## 2021-02-24 DIAGNOSIS — M6281 Muscle weakness (generalized): Secondary | ICD-10-CM | POA: Insufficient documentation

## 2021-02-24 DIAGNOSIS — M25512 Pain in left shoulder: Secondary | ICD-10-CM

## 2021-02-24 NOTE — Therapy (Signed)
[facility] [address] [place], [place], [address] Phone: [phone]   Fax:  [phone]  Occupational Therapy Treatment  Patient Details  Name: [name] MRN: [id number] Date of Birth: 31-May-1965 Referring Provider (OT): Sula Soda, MD   Encounter Date: 02/24/2021   OT End of Session - 02/24/21 1450     Visit Number 7    Number of Visits 13    Date for OT Re-Evaluation 04/21/21   8 additional visits in 10 weeks d/t any scheduling conflicts - updated at recert 02/10/21   Authorization Type BCBS    Authorization Time Period 1st 5 visits are at no charge, VL: PT/OT combined 30    Authorization - Number of Visits 15    OT Start Time 1445    OT Stop Time 1530    OT Time Calculation (min) 45 min    Activity Tolerance Patient tolerated treatment well    Behavior During Therapy [facility] for tasks assessed/performed             Past Medical History:  Diagnosis Date   Anxiety    Cataract    removed left eye   Chronic combined systolic and diastolic CHF (congestive heart failure) (HCC)    EF 25-30%   Diastolic dysfunction    Dyslipidemia    Hyperlipidemia    Hypertension    Neuromuscular disorder (HCC)    neuropathy left foot   Nonischemic dilated cardiomyopathy (HCC)    felt secondary to HTN with no ischemia on nuclear stress test 05/2012, EF 25-30%   Seizures (HCC)    last seizure 05-2020- on Keppra   Shortness of breath    Stroke (HCC) 05/2020    Past Surgical History:  Procedure Laterality Date   COLONOSCOPY WITH PROPOFOL N/A 12/09/2020   Procedure: COLONOSCOPY WITH PROPOFOL;  Surgeon: Benancio Deeds, MD;  Location: [facility];  Service: Gastroenterology;  Laterality: N/A;   KNEE ARTHROSCOPY Left 04/13/2016   Procedure: ARTHROSCOPY KNEE WITH DEBRIDEMENT;  Surgeon: Valeria Batman, MD;  Location: [facility];  Service: Orthopedics;  Laterality: Left;   KNEE ARTHROSCOPY WITH  MEDIAL MENISECTOMY Right 04/13/2016   Procedure: KNEE ARTHROSCOPY WITH MEDIAL MENISECTOMY;  Surgeon: Valeria Batman, MD;  Location: [facility];  Service: Orthopedics;  Laterality: LEFT not right knee   NASAL FRACTURE SURGERY     SHOULDER SURGERY Left     Vitals:   02/24/21 1448  BP: (!) 159/74  Pulse: 69     Subjective Assessment - 02/24/21 1450     Subjective  "can you check my blood pressure?    Pertinent History hypertension, obesity, cardiomyopathy, hyperlipidemia, CHF    Limitations Fall Risk. Sensory Deficits LUE    Patient Stated Goals driving, cooking, walking, bathing himself    Currently in Pain? No/denies    Pain Score 0-No pain                          OT Treatments/Exercises (OP) - 02/24/21 0001       Neurological Re-education Exercises   Other Exercises 1 seated edge of mat with LUE exercises with quad cane for forward reaching and horizontal abduction    Other Exercises 2 worked on slower movements for decreasing ataxic movements and spasticity. Pt encouraged to move with slower and more controlled movements with ambulation and with functioanl use of LUE    Other Grasp and Release Exercises  flipping  jumbo cards with LUE with encouragement for slower movements - Pt able to move and complete with good movements when slowing down                      OT Short Term Goals - 02/10/21 1117       OT SHORT TERM GOAL #1   Title Pt will be independent with HEP targeting LUE range of motion and coordination    Time 4    Period Weeks    Status On-going   pt req'd review 02/10/21   Target Date 01/05/21      OT SHORT TERM GOAL #2   Title Pt will increase box and blocks score with LUE to 10 blocks or greater for increasing functional use of LUE.    Baseline R 40, L 7    Time 4    Period Weeks    Status On-going   9 blocks LUE 02/10/21     OT SHORT TERM GOAL #3   Title Pt will verbalize understanding of sensory safety  strategies for increasing independence and safety    Time 4    Period Weeks    Status Achieved      OT SHORT TERM GOAL #4   Title Pt will perform simple warm meal prep and/or light housekeeping with supervision and incresed safety awareness d/t sensory deficits.    Time 4    Period Weeks    Status Achieved   pt reports doing his own laundry 02/10/21              OT Long Term Goals - 02/24/21 1451       OT LONG TERM GOAL #1   Title Pt will be independent with any updated HEPs    Time 8    Period Weeks    Status New    Target Date 04/21/21      OT LONG TERM GOAL #2   Title Pt will perform bathing with mod I and good safety awareness consistently, per report.    Time 8    Period Weeks    Status Achieved   pt reports doing this at his son in laws and requiring asssistance for getting in and out of shower at home - 02/24/21     OT LONG TERM GOAL #3   Title Pt will assist with at least one home management and/or meal prep task per day in order to increase independence and decrease caregiver burden.    Time 8    Period Weeks    Status Achieved   pt reports consistently assistance with cooking with air fryer and laundry, etc     OT LONG TERM GOAL #4   Title Pt will obtain a lightweight object from a low to mid level surface with LUE in order to increase ability to use LUE for functional reaching and use.    Time 8    Period Weeks    Status On-going                   Plan - 02/24/21 1536     Clinical Impression Statement Pt with good response to encouragement for moving slowly - decreased spasticity and ataxic movements with functional tasks.    OT Occupational Profile and History Problem Focused Assessment - Including review of records relating to presenting problem    Occupational performance deficits (Please refer to evaluation for details): ADL's;IADL's;Leisure    Body Structure / Function /  Physical Skills  ADL;GMC;FMC;Flexibility;Coordination;Sensation;IADL;ROM;UE functional use;Proprioception;Pain;Strength;Decreased knowledge of use of DME    Rehab Potential Good    Clinical Decision Making Limited treatment options, no task modification necessary    Comorbidities Affecting Occupational Performance: None    Modification or Assistance to Complete Evaluation  No modification of tasks or assist necessary to complete eval    OT Frequency 1x / week    OT Duration Other (comment)   8 visits over 10 weeks d/t any scheduling conflicts at recert - 02/10/21   OT Treatment/Interventions Self-care/ADL training;Aquatic Therapy;Patient/family education;Manual Therapy;Electrical Stimulation;Neuromuscular education;Functional Mobility Training;Passive range of motion;Visual/perceptual remediation/compensation;Therapeutic exercise;Ultrasound;Moist Heat;Fluidtherapy;DME and/or AE instruction;Therapeutic activities;Splinting    Plan LUE NMR, LUE grasp and release and functional use    Consulted and Agree with Plan of Care Patient;Family member/caregiver    Family Member Consulted spouse             Patient will benefit from skilled therapeutic intervention in order to improve the following deficits and impairments:   Body Structure / Function / Physical Skills: ADL, GMC, FMC, Flexibility, Coordination, Sensation, IADL, ROM, UE functional use, Proprioception, Pain, Strength, Decreased knowledge of use of DME       Visit Diagnosis: 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-dominant side (HCC)  Muscle weakness (generalized)  Other lack of coordination  Unsteadiness on feet  Chronic left shoulder pain  Visuospatial deficit    Problem List Patient Active Problem List   Diagnosis Date Noted   Colon cancer screening    Left hemiplegia (HCC) 07/02/2020   Cognitive deficit due to recent stroke 07/02/2020   Dysphagia, post-stroke    Vascular headache    Acute blood  loss anemia    AKI (acute kidney injury) (HCC)    Essential hypertension    Seizures (HCC)    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 [place], [place], [address] Phone: [phone]   Fax:  [phone]  Name: [name] MRN: [id number] Date of Birth: Nov 18, 1965

## 2021-02-25 ENCOUNTER — Encounter: Payer: Self-pay | Admitting: Physical Therapy

## 2021-02-25 NOTE — Therapy (Signed)
[facility] [address] [place], [place], [address] Phone: [phone]   Fax:  [phone]  Physical Therapy Treatment  Patient Details  Name: [name] MRN: [id number] Date of Birth: 08-Nov-1965 Referring Provider (PT): Sula Soda, MD   Encounter Date: 02/24/2021   PT End of Session - 02/25/21 1643     Visit Number 8    Number of Visits 15   1st 5 visits are at no charge; 30 PT/OT combined   Date for PT Re-Evaluation 04/08/21    Authorization Type BCBS. 30 visits PT/OT combined, each dicipline counts as 1 visit    Authorization - Visit Number 7   PT and OT visits since 01-23-21   Authorization - Number of Visits 30    PT Start Time 1535    PT Stop Time 1623    PT Time Calculation (min) 48 min    Equipment Utilized During Treatment Gait belt    Activity Tolerance Patient tolerated treatment well    Behavior During Therapy WFL for tasks assessed/performed             Past Medical History:  Diagnosis Date   Anxiety    Cataract    removed left eye   Chronic combined systolic and diastolic CHF (congestive heart failure) (HCC)    EF 25-30%   Diastolic dysfunction    Dyslipidemia    Hyperlipidemia    Hypertension    Neuromuscular disorder (HCC)    neuropathy left foot   Nonischemic dilated cardiomyopathy (HCC)    felt secondary to HTN with no ischemia on nuclear stress test 05/2012, EF 25-30%   Seizures (HCC)    last seizure 05-2020- on Keppra   Shortness of breath    Stroke (HCC) 05/2020    Past Surgical History:  Procedure Laterality Date   COLONOSCOPY WITH PROPOFOL N/A 12/09/2020   Procedure: COLONOSCOPY WITH PROPOFOL;  Surgeon: Benancio Deeds, MD;  Location: [facility];  Service: Gastroenterology;  Laterality: N/A;   KNEE ARTHROSCOPY Left 04/13/2016   Procedure: ARTHROSCOPY KNEE WITH DEBRIDEMENT;  Surgeon: Valeria Batman, MD;  Location: [facility];  Service:  Orthopedics;  Laterality: Left;   KNEE ARTHROSCOPY WITH MEDIAL MENISECTOMY Right 04/13/2016   Procedure: KNEE ARTHROSCOPY WITH MEDIAL MENISECTOMY;  Surgeon: Valeria Batman, MD;  Location: [facility];  Service: Orthopedics;  Laterality: LEFT not right knee   NASAL FRACTURE SURGERY     SHOULDER SURGERY Left     There were no vitals filed for this visit.   Subjective Assessment - 02/25/21 1614     Subjective Pt reports he and wife went to [facility] and he purchased new cane tips for his quad cane; states he was was walking out (down the sidewalk) and his Lt foot caught some but he was able to catch himself - states it wasn't a big loss of balance    Pertinent History HTN, cardiomyopathy, chronic combined systolic & diastolic CHF, alcohol use disorder, seizures    Currently in Pain? No/denies    Pain Onset More than a month ago                               [facility] Adult PT Treatment/Exercise - 02/25/21 0001       Transfers   Transfers Sit to Stand;Stand to Sit    Sit to Stand 4: Min guard;4: Min assist  Stand to Sit 5: Supervision    Comments Rt foot placed on balance bubble for increased LLE weight bearing and strengthening      Ambulation/Gait   Ambulation/Gait Yes    Ambulation/Gait Assistance 4: Min guard    Ambulation Distance (Feet) 230 Feet    Assistive device Small based quad cane   with Ottobock walk on AFO on LLE   Gait Pattern Decreased step length - left;Decreased arm swing - left;Decreased stance time - left;Decreased hip/knee flexion - left;Decreased dorsiflexion - left;Decreased weight shift to left    Ambulation Surface Level;Indoor    Stairs Yes    Stairs Assistance 4: Min guard    Stair Management Technique One rail Right;Step to pattern;Forwards;With cane    Number of Stairs 4   2 reps = 8 steps total   Height of Stairs 6    Ramp 4: Min assist    Curb 4: Min assist   2 reps - trialed RLE 1st with cane last, then cane 1st  with RLE, LLE; cane 1st worked better with pt able to maintain balance   Gait Comments trialed sequence of RLE first on 1st rep of step negotiation, then LLE first on 2nd rep; RLE 1st is better and safer, and then bringing cane up last with ascension; cane down first with descension      Therapeutic Activites    Therapeutic Activities Other Therapeutic Activities    Other Therapeutic Activities Floor to stand transfer - seated to tall kneeling on floor - to sitting; pt transferred to quadruped to Lt 1/2 kneeling to standing with min to CGA      Knee/Hip Exercises: Stretches   Active Hamstring Stretch Left;1 rep;30 seconds   Runner's stretch in standing at counter   Gastroc Stretch Left;1 rep;30 seconds                     PT Education - 02/25/21 1638     Education Details Recommended to pt that he do runner's stretch and gastroc stretch in the mornings to assist with tightness & spasticity reduction in LLE    Person(s) Educated Patient    Methods Explanation;Demonstration    Comprehension Verbalized understanding;Returned demonstration              PT Short Term Goals - 02/25/21 1650       PT SHORT TERM GOAL #1   Title Pt will improve Berg balance test score by at least 4 points to demo improvement in balance.  Increase Berg score to >/= 49/56 (Target date for all STG's 03-11-21)    Baseline 01/06/21: initial score set this date is 40/56;  46/56 on 02-10-21    Time 4    Period Weeks    Status New    Target Date 03/11/21      PT SHORT TERM GOAL #2   Title Improve TUG score from 38.87 secs to </= 33 secs with use of hemiwalker to demo improvement in functional mobility.; Upgraded STG - TUG score </= 26 secs with hemiwalker    Baseline 01/06/21: unable to reassess due to time contraints, 38.87 sec's at baseline;  29.53 secs on 02-10-21 with hemiwalker    Time 4    Period Weeks    Status New    Target Date 03/11/21      PT SHORT TERM GOAL #3   Title Amb. with SBQC  230' with CGA on flat, even surface for increased community accessibility.;  Amb. 460' with  SBQC with SBA    Baseline 350' with SBQC on 02-10-21    Time 4    Period Weeks    Status New    Target Date 03/11/21      PT SHORT TERM GOAL #4   Title Assess foot up brace vs. AFO for LLE for safety with amb.    Time 4    Period Weeks    Status New    Target Date 03/11/21               PT Long Term Goals - 02/25/21 1650       PT LONG TERM GOAL #1   Title Improve Berg balance test score by at least 8 points for reduced fall risk.  Increase Berg score to >/= 52/56 for reduced fall risk.    Baseline improved 6 points from 40/56 on 01-06-21 to 46/56 on 02-10-21    Time 8    Period Weeks    Status New    Target Date 04/08/21      PT LONG TERM GOAL #2   Title Increase gait velocity from .68 ft/sec to >/= 1.4 ft/sec with hemiwalker for increased gait efficiency.  Upgraded LTG - increase gait speed to >/= 1.6 ft/sec with [facility]    Baseline 47.78 secs = .68 ft/sec;     02-10-21:   26.78 secs = 1.22 ft/sec with hemiwalker    Time 8    Period Weeks    Status New    Target Date 04/08/21      PT LONG TERM GOAL #3   Title Improve TUG score to </= 30 secs with hemiwalker to reduce fall risk and to demo incr. functional mobility. Upgraded LTG - score </=24 secs with SBQC    Baseline 38.87 secs with hemiwalker;   02-10-21--  29.53 secs with hemiwalker    Time 8    Period Weeks    Status New    Target Date 04/08/21      PT LONG TERM GOAL #4   Title Amb. 30' without use of assistive device with SBA for household amb. Upgraded goal - modified independent household amb. without device    Baseline hemiwalker - 55' with SBA to CGA;  30' with CGA as Lt foot does not clear floor occasionally    Time 8    Period Weeks    Status New    Target Date 04/08/21      PT LONG TERM GOAL #5   Title Amb. 500' with SPC with SBA for incr. community accessibility. Upgraded LTG - amb. 550' with SBQC on flat, even  surface with SBA    Baseline 350' with SBQC on 02-10-21    Time 8    Period Weeks    Status New    Target Date 04/08/21      PT LONG TERM GOAL #6   Title Perform floor to stand transfer with UE support on object with SBA.    Time 8    Period Weeks    Status On-going    Target Date 04/08/21                   Plan - 02/25/21 1644     Clinical Impression Statement Session focused on gait training with use of Lt Ottobock AFO and SBQC and also step, curb and ramp training.  Different sequences with use quad cane trialed with curb negotiation and also with step descension.  Gait continues to be improved  with use of AFO on LLE.  Pt did well with floor to stand transfer with some difficulty pushing up to stand from Lt 1/2 kneeling position.  Cont with POC.    Personal Factors and Comorbidities Behavior Pattern;Comorbidity 2;Profession;Transportation    Comorbidities cardiomyopathy, systolic & diastolic CHF, ICH, seizures    Examination-Activity Limitations Bend;Carry;Lift;Stand;Stairs;Squat;Locomotion Level;Transfers;Sleep;Bed Mobility    Examination-Participation Restrictions Cleaning;Community Activity;Driving;Shop;Laundry;Occupation;Meal Prep;Yard Work    Conservation officer, historic buildings Evolving/Moderate complexity    Rehab Potential Good    PT Frequency 2x / week    PT Duration 8 weeks    PT Treatment/Interventions ADLs/Self Care Home Management;Aquatic Therapy;DME Instruction;Gait training;Stair training;Therapeutic activities;Therapeutic exercise;Balance training;Orthotic Fit/Training;Patient/family education;Neuromuscular re-education    PT Next Visit Plan added additional ex's to HEP; continue to work on strengthening and balance. trial AFO on left LE    PT Home Exercise Plan Access Code: [email or web address]    Consulted and Agree with Plan of Care Patient;Family member/caregiver    Family Member Consulted wife [name]             Patient will benefit from skilled therapeutic  intervention in order to improve the following deficits and impairments:  Abnormal gait, Decreased activity tolerance, Decreased balance, Decreased coordination, Decreased strength, Impaired tone, Impaired sensation, Impaired UE functional use, Impaired vision/preception, Pain  Visit Diagnosis: 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-dominant side (HCC)  Other abnormalities of gait and mobility  Muscle weakness (generalized)     Problem List Patient Active Problem List   Diagnosis Date Noted   Colon cancer screening    Left hemiplegia (HCC) 07/02/2020   Cognitive deficit due to recent stroke 07/02/2020   Dysphagia, post-stroke    Vascular headache    Acute blood loss anemia    AKI (acute kidney injury) (HCC)    Essential hypertension    Seizures (HCC)    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 [place], [place], [address] Phone: [phone]   Fax:  [phone]  Name: [name] MRN: [id number] Date of Birth: 09/04/1965

## 2021-02-27 DIAGNOSIS — I610 Nontraumatic intracerebral hemorrhage in hemisphere, subcortical: Secondary | ICD-10-CM | POA: Diagnosis not present

## 2021-02-27 DIAGNOSIS — I5042 Chronic combined systolic (congestive) and diastolic (congestive) heart failure: Secondary | ICD-10-CM | POA: Diagnosis not present

## 2021-02-27 DIAGNOSIS — R569 Unspecified convulsions: Secondary | ICD-10-CM | POA: Diagnosis not present

## 2021-03-01 DIAGNOSIS — R569 Unspecified convulsions: Secondary | ICD-10-CM | POA: Diagnosis not present

## 2021-03-01 DIAGNOSIS — I5042 Chronic combined systolic (congestive) and diastolic (congestive) heart failure: Secondary | ICD-10-CM | POA: Diagnosis not present

## 2021-03-01 DIAGNOSIS — I610 Nontraumatic intracerebral hemorrhage in hemisphere, subcortical: Secondary | ICD-10-CM | POA: Diagnosis not present

## 2021-03-03 ENCOUNTER — Telehealth: Payer: Self-pay | Admitting: Physical Therapy

## 2021-03-03 ENCOUNTER — Ambulatory Visit: Payer: BC Managed Care – PPO | Admitting: Occupational Therapy

## 2021-03-03 ENCOUNTER — Ambulatory Visit: Payer: BC Managed Care – PPO | Admitting: Physical Therapy

## 2021-03-03 ENCOUNTER — Encounter: Payer: Self-pay | Admitting: Occupational Therapy

## 2021-03-03 ENCOUNTER — Other Ambulatory Visit: Payer: Self-pay

## 2021-03-03 ENCOUNTER — Other Ambulatory Visit: Payer: Self-pay | Admitting: Physical Medicine and Rehabilitation

## 2021-03-03 DIAGNOSIS — R278 Other lack of coordination: Secondary | ICD-10-CM

## 2021-03-03 DIAGNOSIS — R2681 Unsteadiness on feet: Secondary | ICD-10-CM

## 2021-03-03 DIAGNOSIS — I69354 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-dominant side: Secondary | ICD-10-CM

## 2021-03-03 DIAGNOSIS — R2689 Other abnormalities of gait and mobility: Secondary | ICD-10-CM | POA: Diagnosis not present

## 2021-03-03 DIAGNOSIS — R41842 Visuospatial deficit: Secondary | ICD-10-CM

## 2021-03-03 DIAGNOSIS — G8194 Hemiplegia, unspecified affecting left nondominant side: Secondary | ICD-10-CM

## 2021-03-03 DIAGNOSIS — M6281 Muscle weakness (generalized): Secondary | ICD-10-CM | POA: Diagnosis not present

## 2021-03-03 DIAGNOSIS — M25512 Pain in left shoulder: Secondary | ICD-10-CM | POA: Diagnosis not present

## 2021-03-03 DIAGNOSIS — G8929 Other chronic pain: Secondary | ICD-10-CM

## 2021-03-03 NOTE — Therapy (Signed)
[facility] [address] [place], [place], [address] Phone: [phone]   Fax:  [phone]  Occupational Therapy Treatment  Patient Details  Name: [name] MRN: [id number] Date of Birth: 20-Dec-1965 Referring Provider (OT): Sula Soda, MD   Encounter Date: 03/03/2021   OT End of Session - 03/03/21 1320     Visit Number 8    Number of Visits 13    Date for OT Re-Evaluation 04/21/21   8 additional visits in 10 weeks d/t any scheduling conflicts - updated at recert 02/10/21   Authorization Type BCBS    Authorization Time Period 1st 5 visits are at no charge, VL: PT/OT combined 30    Authorization - Number of Visits 15    OT Start Time 1315    OT Stop Time 1400    OT Time Calculation (min) 45 min    Activity Tolerance Patient tolerated treatment well    Behavior During Therapy [facility] for tasks assessed/performed             Past Medical History:  Diagnosis Date   Anxiety    Cataract    removed left eye   Chronic combined systolic and diastolic CHF (congestive heart failure) (HCC)    EF 25-30%   Diastolic dysfunction    Dyslipidemia    Hyperlipidemia    Hypertension    Neuromuscular disorder (HCC)    neuropathy left foot   Nonischemic dilated cardiomyopathy (HCC)    felt secondary to HTN with no ischemia on nuclear stress test 05/2012, EF 25-30%   Seizures (HCC)    last seizure 05-2020- on Keppra   Shortness of breath    Stroke (HCC) 05/2020    Past Surgical History:  Procedure Laterality Date   COLONOSCOPY WITH PROPOFOL N/A 12/09/2020   Procedure: COLONOSCOPY WITH PROPOFOL;  Surgeon: Benancio Deeds, MD;  Location: [facility];  Service: Gastroenterology;  Laterality: N/A;   KNEE ARTHROSCOPY Left 04/13/2016   Procedure: ARTHROSCOPY KNEE WITH DEBRIDEMENT;  Surgeon: Valeria Batman, MD;  Location: [name];  Service: Orthopedics;  Laterality: Left;   KNEE ARTHROSCOPY WITH  MEDIAL MENISECTOMY Right 04/13/2016   Procedure: KNEE ARTHROSCOPY WITH MEDIAL MENISECTOMY;  Surgeon: Valeria Batman, MD;  Location: [facility];  Service: Orthopedics;  Laterality: LEFT not right knee   NASAL FRACTURE SURGERY     SHOULDER SURGERY Left     There were no vitals filed for this visit.   Subjective Assessment - 03/03/21 1318     Subjective  "moving all my stuff"    Pertinent History hypertension, obesity, cardiomyopathy, hyperlipidemia, CHF    Limitations Fall Risk. Sensory Deficits LUE    Patient Stated Goals driving, cooking, walking, bathing himself    Currently in Pain? No/denies    Pain Score 0-No pain             Stretching with physioball x 12 reps for forward reaching   Weight bearing in LUE for inhibition of tone and facilitation of muscle activation while reaching with RUE to place resistance clothespins. Pt required mod assistance for maintaining hand position on mat for weight bearing.   UE Ranger at vertical reach with LUE with mod cues and with difficulty with elbow extension with against gravity movement  PVC pipe frame working on overhead reaching to simulate putting shirt overhead x 10 reps  Grasp/Release with LUE color dowels with verbal cues for movement pattern with LUE.  OT Short Term Goals - 02/10/21 1117       OT SHORT TERM GOAL #1   Title Pt will be independent with HEP targeting LUE range of motion and coordination    Time 4    Period Weeks    Status On-going   pt req'd review 02/10/21   Target Date 01/05/21      OT SHORT TERM GOAL #2   Title Pt will increase box and blocks score with LUE to 10 blocks or greater for increasing functional use of LUE.    Baseline R 40, L 7    Time 4    Period Weeks    Status On-going   9 blocks LUE 02/10/21     OT SHORT TERM GOAL #3   Title Pt will verbalize understanding of sensory safety strategies for increasing independence and safety     Time 4    Period Weeks    Status Achieved      OT SHORT TERM GOAL #4   Title Pt will perform simple warm meal prep and/or light housekeeping with supervision and incresed safety awareness d/t sensory deficits.    Time 4    Period Weeks    Status Achieved   pt reports doing his own laundry 02/10/21              OT Long Term Goals - 02/24/21 1451       OT LONG TERM GOAL #1   Title Pt will be independent with any updated HEPs    Time 8    Period Weeks    Status New    Target Date 04/21/21      OT LONG TERM GOAL #2   Title Pt will perform bathing with mod I and good safety awareness consistently, per report.    Time 8    Period Weeks    Status Achieved   pt reports doing this at his son in laws and requiring asssistance for getting in and out of shower at home - 02/24/21     OT LONG TERM GOAL #3   Title Pt will assist with at least one home management and/or meal prep task per day in order to increase independence and decrease caregiver burden.    Time 8    Period Weeks    Status Achieved   pt reports consistently assistance with cooking with air fryer and laundry, etc     OT LONG TERM GOAL #4   Title Pt will obtain a lightweight object from a low to mid level surface with LUE in order to increase ability to use LUE for functional reaching and use.    Time 8    Period Weeks    Status On-going                   Plan - 03/03/21 1349     Clinical Impression Statement Pt continues with some movement patterns learned over time that are overcompesating for shoulder flexion and reach    OT Occupational Profile and History Problem Focused Assessment - Including review of records relating to presenting problem    Occupational performance deficits (Please refer to evaluation for details): ADL's;IADL's;Leisure    Body Structure / Function / Physical Skills ADL;GMC;FMC;Flexibility;Coordination;Sensation;IADL;ROM;UE functional use;Proprioception;Pain;Strength;Decreased  knowledge of use of DME    Rehab Potential Good    Clinical Decision Making Limited treatment options, no task modification necessary    Comorbidities Affecting Occupational Performance: None    Modification or Assistance  to Complete Evaluation  No modification of tasks or assist necessary to complete eval    OT Frequency 1x / week    OT Duration Other (comment)   8 visits over 10 weeks d/t any scheduling conflicts at recert - 02/10/21   OT Treatment/Interventions Self-care/ADL training;Aquatic Therapy;Patient/family education;Manual Therapy;Electrical Stimulation;Neuromuscular education;Functional Mobility Training;Passive range of motion;Visual/perceptual remediation/compensation;Therapeutic exercise;Ultrasound;Moist Heat;Fluidtherapy;DME and/or AE instruction;Therapeutic activities;Splinting    Plan reaching patterns, grasp and release with LUE    Consulted and Agree with Plan of Care Patient;Family member/caregiver    Family Member Consulted spouse             Patient will benefit from skilled therapeutic intervention in order to improve the following deficits and impairments:   Body Structure / Function / Physical Skills: ADL, GMC, FMC, Flexibility, Coordination, Sensation, IADL, ROM, UE functional use, Proprioception, Pain, Strength, Decreased knowledge of use of DME       Visit Diagnosis: 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-dominant side (HCC)  Other lack of coordination  Muscle weakness (generalized)  Unsteadiness on feet  Chronic left shoulder pain  Visuospatial deficit  Other abnormalities of gait and mobility    Problem List Patient Active Problem List   Diagnosis Date Noted   Colon cancer screening    Left hemiplegia (HCC) 07/02/2020   Cognitive deficit due to recent stroke 07/02/2020   Dysphagia, post-stroke    Vascular headache    Acute blood loss anemia    AKI (acute kidney injury) (HCC)    Essential hypertension     Seizures (HCC)    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 [place], [place], [address] Phone: [phone]   Fax:  [phone]  Name: [name] MRN: [id number] Date of Birth: 12/29/1965

## 2021-03-03 NOTE — Telephone Encounter (Signed)
Dr. Carlis Abbott, [name] is being treated by physical therapy for gait abnormality and Lt hemiparesis s/p CVA.  Mr. Pryor will benefit from use of left AFO in order to improve safety with functional mobility.    If you agree, please submit request in EPIC under MD Order, Other Orders (list left AFO in comments) or fax to [facility] at [phone].   Thank you, Kerry Fort, PT   [facility] [address] [place], [place]  [address] Phone:  [phone] Fax:  [phone]

## 2021-03-04 NOTE — Patient Instructions (Signed)
Gastroc / Heel Cord Stretch - On Step    Stand with heels over edge of stair. Holding rail, lower heels until stretch is felt in calf of legs. Repeat __1-2_ times. Do _1-2__ times per day.     Heel Cord Stretch    Place one leg forward, bent, other leg behind and straight. Lean forward keeping back heel flat. Hold __20-30__ seconds while counting out loud. Repeat with other leg. Repeat ___1_ times. Do _2___ sessions per day.  http://gt2.exer.us/511   Copyright  VHI. All rights reserved.    Hamstring: Stretch (Sitting)    Sit straight. Extend right knee until stretch is felt in back of thigh. Hold __30_ seconds. Relax. Repeat _1-2__ times. Do _1-2__ times a day. Repeat with other leg. Advanced: Lean trunk forward.  Copyright  VHI. All rights reserved.

## 2021-03-04 NOTE — Therapy (Signed)
[facility] [address] [place], [place], [address] Phone: [phone]   Fax:  [phone]  Physical Therapy Treatment  Patient Details  Name: [name] MRN: [id number] Date of Birth: Nov 04, 1965 Referring Provider (PT): Sula Soda, MD   Encounter Date: 03/03/2021   PT End of Session - 03/04/21 1156     Visit Number 9    Number of Visits 15   1st 5 visits are at no charge; 30 PT/OT combined   Date for PT Re-Evaluation 04/08/21    Authorization Type BCBS. 30 visits PT/OT combined, each dicipline counts as 1 visit    Authorization - Visit Number 8   PT and OT visits since 01-23-21   Authorization - Number of Visits 30    PT Start Time 1402    PT Stop Time 1445    PT Time Calculation (min) 43 min    Equipment Utilized During Treatment Gait belt    Activity Tolerance Patient tolerated treatment well    Behavior During Therapy WFL for tasks assessed/performed             Past Medical History:  Diagnosis Date   Anxiety    Cataract    removed left eye   Chronic combined systolic and diastolic CHF (congestive heart failure) (HCC)    EF 25-30%   Diastolic dysfunction    Dyslipidemia    Hyperlipidemia    Hypertension    Neuromuscular disorder (HCC)    neuropathy left foot   Nonischemic dilated cardiomyopathy (HCC)    felt secondary to HTN with no ischemia on nuclear stress test 05/2012, EF 25-30%   Seizures (HCC)    last seizure 05-2020- on Keppra   Shortness of breath    Stroke (HCC) 05/2020    Past Surgical History:  Procedure Laterality Date   COLONOSCOPY WITH PROPOFOL N/A 12/09/2020   Procedure: COLONOSCOPY WITH PROPOFOL;  Surgeon: Benancio Deeds, MD;  Location: [facility];  Service: Gastroenterology;  Laterality: N/A;   KNEE ARTHROSCOPY Left 04/13/2016   Procedure: ARTHROSCOPY KNEE WITH DEBRIDEMENT;  Surgeon: Valeria Batman, MD;  Location: [facility];  Service:  Orthopedics;  Laterality: Left;   KNEE ARTHROSCOPY WITH MEDIAL MENISECTOMY Right 04/13/2016   Procedure: KNEE ARTHROSCOPY WITH MEDIAL MENISECTOMY;  Surgeon: Valeria Batman, MD;  Location: [facility];  Service: Orthopedics;  Laterality: LEFT not right knee   NASAL FRACTURE SURGERY     SHOULDER SURGERY Left     There were no vitals filed for this visit.   Subjective Assessment - 03/04/21 1147     Subjective Pt reports he got down on the floor at home to arrange his shoes under the bed - was able to get up by himself - states it took some time but he did it - "I'm not going to make a habit of it though"; discussed starting aquatic therapy - pt requests to not start next week (the 15th) but the week after    Pertinent History HTN, cardiomyopathy, chronic combined systolic & diastolic CHF, alcohol use disorder, seizures    Currently in Pain? No/denies    Pain Onset More than a month ago                               [facility] Adult PT Treatment/Exercise - 03/04/21 0001       Transfers   Transfers Sit to Stand;Stand to Sit  Sit to Stand 4: Min guard    Stand to Sit 5: Supervision    Number of Reps 10 reps    Comments Rt foot placed on balance bubble for increased LLE weight bearing and strengthening      Ambulation/Gait   Ambulation/Gait Yes    Ambulation/Gait Assistance 4: Min guard    Ambulation Distance (Feet) 115 Feet   115' 2nd rep   Assistive device None   AFO used on LLE   Gait Pattern Decreased step length - left;Decreased arm swing - left;Decreased stance time - left;Decreased hip/knee flexion - left;Decreased dorsiflexion - left;Decreased weight shift to left    Ambulation Surface Level;Indoor      Knee/Hip Exercises: Stretches   Active Hamstring Stretch Left;20 seconds   LLE extended in seated position   Passive Hamstring Stretch Left;1 rep;30 seconds   seated position - LLE extended - on PT's leg - min assist to maintain leg in neutral  position without external rotation   Gastroc Stretch Left;1 rep;30 seconds   2" block     Knee/Hip Exercises: Aerobic   Recumbent Bike SciFit level 3.0 5" with UE's and LE's                     PT Education - 03/04/21 1206     Education Details gave sheet with hamstring and heel cord stretch on it (4 pictures)    Person(s) Educated Patient    Methods Explanation;Demonstration;Handout    Comprehension Verbalized understanding;Returned demonstration              PT Short Term Goals - 03/04/21 1203       PT SHORT TERM GOAL #1   Title Pt will improve Berg balance test score by at least 4 points to demo improvement in balance.  Increase Berg score to >/= 49/56 (Target date for all STG's 03-11-21)    Baseline 01/06/21: initial score set this date is 40/56;  46/56 on 02-10-21    Time 4    Period Weeks    Status New    Target Date 03/11/21      PT SHORT TERM GOAL #2   Title Improve TUG score from 38.87 secs to </= 33 secs with use of hemiwalker to demo improvement in functional mobility.; Upgraded STG - TUG score </= 26 secs with hemiwalker    Baseline 01/06/21: unable to reassess due to time contraints, 38.87 sec's at baseline;  29.53 secs on 02-10-21 with hemiwalker    Time 4    Period Weeks    Status New    Target Date 03/11/21      PT SHORT TERM GOAL #3   Title Amb. with SBQC 230' with CGA on flat, even surface for increased community accessibility.;  Amb. 460' with SBQC with SBA    Baseline 350' with SBQC on 02-10-21    Time 4    Period Weeks    Status New    Target Date 03/11/21      PT SHORT TERM GOAL #4   Title Assess foot up brace vs. AFO for LLE for safety with amb.    Time 4    Period Weeks    Status New    Target Date 03/11/21               PT Long Term Goals - 03/04/21 1204       PT LONG TERM GOAL #1   Title Improve Berg balance test score by at  least 8 points for reduced fall risk.  Increase Berg score to >/= 52/56 for reduced fall risk.     Baseline improved 6 points from 40/56 on 01-06-21 to 46/56 on 02-10-21    Time 8    Period Weeks    Status New    Target Date 04/08/21      PT LONG TERM GOAL #2   Title Increase gait velocity from .68 ft/sec to >/= 1.4 ft/sec with hemiwalker for increased gait efficiency.  Upgraded LTG - increase gait speed to >/= 1.6 ft/sec with [facility]    Baseline 47.78 secs = .68 ft/sec;     02-10-21:   26.78 secs = 1.22 ft/sec with hemiwalker    Time 8    Period Weeks    Status New    Target Date 04/08/21      PT LONG TERM GOAL #3   Title Improve TUG score to </= 30 secs with hemiwalker to reduce fall risk and to demo incr. functional mobility. Upgraded LTG - score </=24 secs with SBQC    Baseline 38.87 secs with hemiwalker;   02-10-21--  29.53 secs with hemiwalker    Time 8    Period Weeks    Status New    Target Date 04/08/21      PT LONG TERM GOAL #4   Title Amb. 30' without use of assistive device with SBA for household amb. Upgraded goal - modified independent household amb. without device    Baseline hemiwalker - 88' with SBA to CGA;  30' with CGA as Lt foot does not clear floor occasionally    Time 8    Period Weeks    Status New    Target Date 04/08/21      PT LONG TERM GOAL #5   Title Amb. 500' with SPC with SBA for incr. community accessibility. Upgraded LTG - amb. 550' with SBQC on flat, even surface with SBA    Baseline 350' with SBQC on 02-10-21    Time 8    Period Weeks    Status New    Target Date 04/08/21      PT LONG TERM GOAL #6   Title Perform floor to stand transfer with UE support on object with SBA.    Time 8    Period Weeks    Status On-going    Target Date 04/08/21                   Plan - 03/04/21 1157     Clinical Impression Statement Pt continues to progress with gait without use of assistive device and also with balance.  Pt amb. 2 reps at 115' around track in today's session with rest break between reps with use of AFO on LLE but only had 1 slight  occurrence of Rt foot sticking to floor.  Session also focused on issuing stretches for HEP for Lt hamstring and heel cord with pictures given.  Pt reported that the distance walked in today's session was the furthest he had done without use of device.  Cont with POC.    Personal Factors and Comorbidities Behavior Pattern;Comorbidity 2;Profession;Transportation    Comorbidities cardiomyopathy, systolic & diastolic CHF, ICH, seizures    Examination-Activity Limitations Bend;Carry;Lift;Stand;Stairs;Squat;Locomotion Level;Transfers;Sleep;Bed Mobility    Examination-Participation Restrictions Cleaning;Community Activity;Driving;Shop;Laundry;Occupation;Meal Prep;Yard Work    Stability/Clinical Decision Making Evolving/Moderate complexity    Rehab Potential Good    PT Frequency 2x / week    PT Duration 8 weeks  PT Treatment/Interventions ADLs/Self Care Home Management;Aquatic Therapy;DME Instruction;Gait training;Stair training;Therapeutic activities;Therapeutic exercise;Balance training;Orthotic Fit/Training;Patient/family education;Neuromuscular re-education    PT Next Visit Plan added additional ex's to HEP; continue to work on strengthening and balance. trial AFO on left LE    PT Home Exercise Plan Access Code: [email or web address]    Consulted and Agree with Plan of Care Patient;Family member/caregiver    Family Member Consulted wife [name]             Patient will benefit from skilled therapeutic intervention in order to improve the following deficits and impairments:  Abnormal gait, Decreased activity tolerance, Decreased balance, Decreased coordination, Decreased strength, Impaired tone, Impaired sensation, Impaired UE functional use, Impaired vision/preception, Pain  Visit Diagnosis: Other abnormalities of gait and mobility  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-dominant side (HCC)  Unsteadiness on feet     Problem List Patient Active Problem List    Diagnosis Date Noted   Colon cancer screening    Left hemiplegia (HCC) 07/02/2020   Cognitive deficit due to recent stroke 07/02/2020   Dysphagia, post-stroke    Vascular headache    Acute blood loss anemia    AKI (acute kidney injury) (HCC)    Essential hypertension    Seizures (HCC)    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 [place], [place], [address] Phone: [phone]   Fax:  [phone]  Name: [name] MRN: [id number] Date of Birth: Mar 18, 1965

## 2021-03-06 ENCOUNTER — Other Ambulatory Visit: Payer: Self-pay | Admitting: Physical Medicine and Rehabilitation

## 2021-03-06 ENCOUNTER — Other Ambulatory Visit: Payer: Self-pay | Admitting: Family

## 2021-03-06 DIAGNOSIS — E782 Mixed hyperlipidemia: Secondary | ICD-10-CM

## 2021-03-07 NOTE — Telephone Encounter (Signed)
Schedule appointment?

## 2021-03-10 ENCOUNTER — Other Ambulatory Visit: Payer: Self-pay

## 2021-03-10 ENCOUNTER — Encounter: Payer: Self-pay | Admitting: Occupational Therapy

## 2021-03-10 ENCOUNTER — Ambulatory Visit: Payer: BC Managed Care – PPO | Admitting: Physical Therapy

## 2021-03-10 ENCOUNTER — Ambulatory Visit: Payer: BC Managed Care – PPO | Admitting: Occupational Therapy

## 2021-03-10 DIAGNOSIS — R278 Other lack of coordination: Secondary | ICD-10-CM | POA: Diagnosis not present

## 2021-03-10 DIAGNOSIS — R41842 Visuospatial deficit: Secondary | ICD-10-CM

## 2021-03-10 DIAGNOSIS — R2689 Other abnormalities of gait and mobility: Secondary | ICD-10-CM

## 2021-03-10 DIAGNOSIS — M6281 Muscle weakness (generalized): Secondary | ICD-10-CM

## 2021-03-10 DIAGNOSIS — M25512 Pain in left shoulder: Secondary | ICD-10-CM | POA: Diagnosis not present

## 2021-03-10 DIAGNOSIS — I69354 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-dominant side: Secondary | ICD-10-CM

## 2021-03-10 DIAGNOSIS — R2681 Unsteadiness on feet: Secondary | ICD-10-CM | POA: Diagnosis not present

## 2021-03-10 DIAGNOSIS — G8929 Other chronic pain: Secondary | ICD-10-CM

## 2021-03-10 MED ORDER — TAMSULOSIN HCL 0.4 MG PO CAPS: ORAL_CAPSULE | ORAL | 0 refills | Status: DC

## 2021-03-10 NOTE — Therapy (Signed)
[facility] [address] [place], [place], [address] Phone: [phone]   Fax:  [phone]  Occupational Therapy Treatment  Patient Details  Name: [name] MRN: [id number] Date of Birth: 03/25/1965 Referring Provider (OT): Sula Soda, MD   Encounter Date: 03/10/2021   OT End of Session - 03/10/21 1413     Visit Number 9    Number of Visits 13    Date for OT Re-Evaluation 04/21/21   8 additional visits in 10 weeks d/t any scheduling conflicts - updated at recert 02/10/21   Authorization Type BCBS    Authorization Time Period 1st 5 visits are at no charge, VL: PT/OT combined 30    Authorization - Number of Visits 15    OT Start Time 1405    OT Stop Time 1445    OT Time Calculation (min) 40 min    Activity Tolerance Patient tolerated treatment well    Behavior During Therapy [facility] for tasks assessed/performed             Past Medical History:  Diagnosis Date   Anxiety    Cataract    removed left eye   Chronic combined systolic and diastolic CHF (congestive heart failure) (HCC)    EF 25-30%   Diastolic dysfunction    Dyslipidemia    Hyperlipidemia    Hypertension    Neuromuscular disorder (HCC)    neuropathy left foot   Nonischemic dilated cardiomyopathy (HCC)    felt secondary to HTN with no ischemia on nuclear stress test 05/2012, EF 25-30%   Seizures (HCC)    last seizure 05-2020- on Keppra   Shortness of breath    Stroke (HCC) 05/2020    Past Surgical History:  Procedure Laterality Date   COLONOSCOPY WITH PROPOFOL N/A 12/09/2020   Procedure: COLONOSCOPY WITH PROPOFOL;  Surgeon: Benancio Deeds, MD;  Location: [facility];  Service: Gastroenterology;  Laterality: N/A;   KNEE ARTHROSCOPY Left 04/13/2016   Procedure: ARTHROSCOPY KNEE WITH DEBRIDEMENT;  Surgeon: Valeria Batman, MD;  Location: [facility];  Service: Orthopedics;  Laterality: Left;   KNEE ARTHROSCOPY WITH  MEDIAL MENISECTOMY Right 04/13/2016   Procedure: KNEE ARTHROSCOPY WITH MEDIAL MENISECTOMY;  Surgeon: Valeria Batman, MD;  Location: [facility];  Service: Orthopedics;  Laterality: LEFT not right knee   NASAL FRACTURE SURGERY     SHOULDER SURGERY Left     There were no vitals filed for this visit.   Subjective Assessment - 03/10/21 1411     Subjective  "i feel like my arm is dislocated or something"    Pertinent History hypertension, obesity, cardiomyopathy, hyperlipidemia, CHF    Limitations Fall Risk. Sensory Deficits LUE    Patient Stated Goals driving, cooking, walking, bathing himself    Currently in Pain? No/denies    Pain Score 0-No pain             Manual Manipulation of LUE shoulder d/t patie reporting thinking shoulder was dislocated or "out of socket". OT did not determine any malalignment different from prior sessions.   Supine closed chain exercises with PVC pipe frame. Pt with no complaints of pain or report of discomfort. 10 x shoulder flexion, chest press, horizontal abduction.  Prone on elbows while placing perfection pieces with RUE while OT managed LUE for safety and alignment.                      OT Short Term  Goals - 02/10/21 1117       OT SHORT TERM GOAL #1   Title Pt will be independent with HEP targeting LUE range of motion and coordination    Time 4    Period Weeks    Status On-going   pt req'd review 02/10/21   Target Date 01/05/21      OT SHORT TERM GOAL #2   Title Pt will increase box and blocks score with LUE to 10 blocks or greater for increasing functional use of LUE.    Baseline R 40, L 7    Time 4    Period Weeks    Status On-going   9 blocks LUE 02/10/21     OT SHORT TERM GOAL #3   Title Pt will verbalize understanding of sensory safety strategies for increasing independence and safety    Time 4    Period Weeks    Status Achieved      OT SHORT TERM GOAL #4   Title Pt will perform simple warm meal  prep and/or light housekeeping with supervision and incresed safety awareness d/t sensory deficits.    Time 4    Period Weeks    Status Achieved   pt reports doing his own laundry 02/10/21              OT Long Term Goals - 02/24/21 1451       OT LONG TERM GOAL #1   Title Pt will be independent with any updated HEPs    Time 8    Period Weeks    Status New    Target Date 04/21/21      OT LONG TERM GOAL #2   Title Pt will perform bathing with mod I and good safety awareness consistently, per report.    Time 8    Period Weeks    Status Achieved   pt reports doing this at his son in laws and requiring asssistance for getting in and out of shower at home - 02/24/21     OT LONG TERM GOAL #3   Title Pt will assist with at least one home management and/or meal prep task per day in order to increase independence and decrease caregiver burden.    Time 8    Period Weeks    Status Achieved   pt reports consistently assistance with cooking with air fryer and laundry, etc     OT LONG TERM GOAL #4   Title Pt will obtain a lightweight object from a low to mid level surface with LUE in order to increase ability to use LUE for functional reaching and use.    Time 8    Period Weeks    Status On-going                   Plan - 03/10/21 1546     Clinical Impression Statement Pt with report of increased discomfort on arrival today but reported much improved at conclusion of session.    OT Occupational Profile and History Problem Focused Assessment - Including review of records relating to presenting problem    Occupational performance deficits (Please refer to evaluation for details): ADL's;IADL's;Leisure    Body Structure / Function / Physical Skills ADL;GMC;FMC;Flexibility;Coordination;Sensation;IADL;ROM;UE functional use;Proprioception;Pain;Strength;Decreased knowledge of use of DME    Rehab Potential Good    Clinical Decision Making Limited treatment options, no task modification  necessary    Comorbidities Affecting Occupational Performance: None    Modification or Assistance to Complete Evaluation  No modification of tasks or assist necessary to complete eval    OT Frequency 1x / week    OT Duration Other (comment)   8 visits over 10 weeks d/t any scheduling conflicts at recert - 02/10/21   OT Treatment/Interventions Self-care/ADL training;Aquatic Therapy;Patient/family education;Manual Therapy;Electrical Stimulation;Neuromuscular education;Functional Mobility Training;Passive range of motion;Visual/perceptual remediation/compensation;Therapeutic exercise;Ultrasound;Moist Heat;Fluidtherapy;DME and/or AE instruction;Therapeutic activities;Splinting    Plan reaching patterns, grasp and release with LUE    Consulted and Agree with Plan of Care Patient;Family member/caregiver    Family Member Consulted spouse             Patient will benefit from skilled therapeutic intervention in order to improve the following deficits and impairments:   Body Structure / Function / Physical Skills: ADL, GMC, FMC, Flexibility, Coordination, Sensation, IADL, ROM, UE functional use, Proprioception, Pain, Strength, Decreased knowledge of use of DME       Visit Diagnosis: 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-dominant side (HCC)  Other lack of coordination  Muscle weakness (generalized)  Chronic left shoulder pain  Visuospatial deficit  Other abnormalities of gait and mobility    Problem List Patient Active Problem List   Diagnosis Date Noted   Colon cancer screening    Left hemiplegia (HCC) 07/02/2020   Cognitive deficit due to recent stroke 07/02/2020   Dysphagia, post-stroke    Vascular headache    Acute blood loss anemia    AKI (acute kidney injury) (HCC)    Essential hypertension    Seizures (HCC)    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 [place], [place], [address] Phone: [phone]   Fax:  [phone]  Name: Laray Corbit [name] MRN: [id number] Date of Birth: [birth year]

## 2021-03-10 NOTE — Telephone Encounter (Signed)
[facility] is sending a request for a refill on Tamsulosin. Would you like to refill?

## 2021-03-11 NOTE — Therapy (Signed)
[facility] [address] [place], [place], [address] Phone: [phone]   Fax:  [phone]  Physical Therapy Treatment  Patient Details  Name: [name] MRN: [id number] Date of Birth: [birth year] Referring Provider (PT): Sula Soda, MD   Encounter Date: 03/10/2021   PT End of Session - 03/11/21 1303     Visit Number 10    Number of Visits 15   1st 5 visits are at no charge; 30 PT/OT combined   Date for PT Re-Evaluation 04/08/21    Authorization Type BCBS. 30 visits PT/OT combined, each dicipline counts as 1 visit    Authorization - Visit Number 9   PT and OT visits since 01-23-21   Authorization - Number of Visits 30    PT Start Time 1450    PT Stop Time 1533    PT Time Calculation (min) 43 min    Equipment Utilized During Treatment Gait belt    Activity Tolerance Patient tolerated treatment well    Behavior During Therapy WFL for tasks assessed/performed             Past Medical History:  Diagnosis Date   Anxiety    Cataract    removed left eye   Chronic combined systolic and diastolic CHF (congestive heart failure) (HCC)    EF 25-30%   Diastolic dysfunction    Dyslipidemia    Hyperlipidemia    Hypertension    Neuromuscular disorder (HCC)    neuropathy left foot   Nonischemic dilated cardiomyopathy (HCC)    felt secondary to HTN with no ischemia on nuclear stress test 05/2012, EF 25-30%   Seizures (HCC)    last seizure 05-2020- on Keppra   Shortness of breath    Stroke (HCC) 05/2020    Past Surgical History:  Procedure Laterality Date   COLONOSCOPY WITH PROPOFOL N/A 12/09/2020   Procedure: COLONOSCOPY WITH PROPOFOL;  Surgeon: Benancio Deeds, MD;  Location: [facility];  Service: Gastroenterology;  Laterality: N/A;   KNEE ARTHROSCOPY Left 04/13/2016   Procedure: ARTHROSCOPY KNEE WITH DEBRIDEMENT;  Surgeon: Valeria Batman, MD;  Location: [facility];  Service:  Orthopedics;  Laterality: Left;   KNEE ARTHROSCOPY WITH MEDIAL MENISECTOMY Right 04/13/2016   Procedure: KNEE ARTHROSCOPY WITH MEDIAL MENISECTOMY;  Surgeon: Valeria Batman, MD;  Location: [facility];  Service: Orthopedics;  Laterality: LEFT not right knee   NASAL FRACTURE SURGERY     SHOULDER SURGERY Left     There were no vitals filed for this visit.   Subjective Assessment - 03/11/21 1254     Subjective Pt reports he sees Dr. Carlis Abbott on Mon., the 27th- has some things he wants to ask him about.  Pt was informed that order for Lt AFO was received; pt having more swelling in LLE today - doesn't know why - pt does state he thinks he hasn't been taking diuretic as prescribed    Pertinent History HTN, cardiomyopathy, chronic combined systolic & diastolic CHF, alcohol use disorder, seizures    Currently in Pain? No/denies    Pain Onset More than a month ago                               [facility] Adult PT Treatment/Exercise - 03/11/21 0001       Transfers   Transfers Sit to Stand;Stand to Sit    Sit to Stand 4: Min guard  Stand to Sit 5: Supervision    Number of Reps Other reps (comment)   5 reps   Comments feet on floor      Ambulation/Gait   Ambulation/Gait Yes    Ambulation/Gait Assistance 4: Min guard;4: Min assist   min assist one occurrence due to Lt foot sticking to floor   Ambulation/Gait Assistance Details Ottobock was donned initially but was removed due to increased edema in Lt ankle with strut of orthosis placing pressure on pt's medial ankle; AFO was removed    Ambulation Distance (Feet) 115 Feet    Assistive device None    Gait Pattern Decreased step length - left;Decreased arm swing - left;Decreased stance time - left;Decreased hip/knee flexion - left;Decreased dorsiflexion - left;Decreased weight shift to left    Ambulation Surface Level;Indoor      Knee/Hip Exercises: Stretches   Active Hamstring Stretch Left;1 rep;30 seconds    Runner's stretch     Knee/Hip Exercises: Aerobic   Recumbent Bike SciFit level 3.0 5" with UE's and LE's      Knee/Hip Exercises: Standing   Forward Step Up Left;1 set;10 reps;Hand Hold: 1;Step Height: 6"    Step Down Left;1 set;10 reps;Hand Hold: 1;Step Height: 4"   inside // bars   Other Standing Knee Exercises sidestepping inside // bars with RUE support 10' x 2 reps with small squat      Knee/Hip Exercises: Seated   Hamstring Curl Strengthening;Left;1 set;10 reps   with red theraband - to 90 degrees with 3 sec hold     Knee/Hip Exercises: Sidelying   Other Sidelying Knee/Hip Exercises attempted sidelying position on Rt side to perform Lt knee flexion gravity eliminated but pt reported pain in Lt hip and was unable to tolerate position                       PT Short Term Goals - 03/11/21 1307       PT SHORT TERM GOAL #1   Title Pt will improve Berg balance test score by at least 4 points to demo improvement in balance.  Increase Berg score to >/= 49/56 (Target date for all STG's 03-11-21)    Baseline 01/06/21: initial score set this date is 40/56;  46/56 on 02-10-21    Time 4    Period Weeks    Status New    Target Date 03/11/21      PT SHORT TERM GOAL #2   Title Improve TUG score from 38.87 secs to </= 33 secs with use of hemiwalker to demo improvement in functional mobility.; Upgraded STG - TUG score </= 26 secs with hemiwalker    Baseline 01/06/21: unable to reassess due to time contraints, 38.87 sec's at baseline;  29.53 secs on 02-10-21 with hemiwalker    Time 4    Period Weeks    Status New    Target Date 03/11/21      PT SHORT TERM GOAL #3   Title Amb. with SBQC 230' with CGA on flat, even surface for increased community accessibility.;  Amb. 460' with SBQC with SBA    Baseline 350' with SBQC on 02-10-21    Time 4    Period Weeks    Status New    Target Date 03/11/21      PT SHORT TERM GOAL #4   Title Assess foot up brace vs. AFO for LLE for safety  with amb.    Time 4    Period Weeks  Status New    Target Date 03/11/21               PT Long Term Goals - 03/11/21 1307       PT LONG TERM GOAL #1   Title Improve Berg balance test score by at least 8 points for reduced fall risk.  Increase Berg score to >/= 52/56 for reduced fall risk.    Baseline improved 6 points from 40/56 on 01-06-21 to 46/56 on 02-10-21    Time 8    Period Weeks    Status New    Target Date 04/08/21      PT LONG TERM GOAL #2   Title Increase gait velocity from .68 ft/sec to >/= 1.4 ft/sec with hemiwalker for increased gait efficiency.  Upgraded LTG - increase gait speed to >/= 1.6 ft/sec with [facility]    Baseline 47.78 secs = .68 ft/sec;     02-10-21:   26.78 secs = 1.22 ft/sec with hemiwalker    Time 8    Period Weeks    Status New    Target Date 04/08/21      PT LONG TERM GOAL #3   Title Improve TUG score to </= 30 secs with hemiwalker to reduce fall risk and to demo incr. functional mobility. Upgraded LTG - score </=24 secs with SBQC    Baseline 38.87 secs with hemiwalker;   02-10-21--  29.53 secs with hemiwalker    Time 8    Period Weeks    Status New    Target Date 04/08/21      PT LONG TERM GOAL #4   Title Amb. 30' without use of assistive device with SBA for household amb. Upgraded goal - modified independent household amb. without device    Baseline hemiwalker - 52' with SBA to CGA;  30' with CGA as Lt foot does not clear floor occasionally    Time 8    Period Weeks    Status New    Target Date 04/08/21      PT LONG TERM GOAL #5   Title Amb. 500' with SPC with SBA for incr. community accessibility. Upgraded LTG - amb. 550' with SBQC on flat, even surface with SBA    Baseline 350' with SBQC on 02-10-21    Time 8    Period Weeks    Status New    Target Date 04/08/21      PT LONG TERM GOAL #6   Title Perform floor to stand transfer with UE support on object with SBA.    Time 8    Period Weeks    Status On-going    Target Date  04/08/21                   Plan - 03/11/21 1304     Clinical Impression Statement Pt had increased edema in LLE prohibiting use of Lt Ottobock AFO for gait training today.  Attempted Lt hamstring strengthening in Rt sidelying position but pt unable to tolerate position due to Lt hip pain experienced - pt may have bursitis in Lt hip.  Pt had one occurrence of Lt foot catching floor with gait training without use of device, requiring CGA to min assist for balance recovery.  Cont with POC.    Personal Factors and Comorbidities Behavior Pattern;Comorbidity 2;Profession;Transportation    Comorbidities cardiomyopathy, systolic & diastolic CHF, ICH, seizures    Examination-Activity Limitations Bend;Carry;Lift;Stand;Stairs;Squat;Locomotion Level;Transfers;Sleep;Bed Mobility    Examination-Participation Restrictions Cleaning;Community Activity;Driving;Shop;Laundry;Occupation;Meal Prep;Pincus Badder Work  Stability/Clinical Decision Making Evolving/Moderate complexity    Rehab Potential Good    PT Frequency 2x / week    PT Duration 8 weeks    PT Treatment/Interventions ADLs/Self Care Home Management;Aquatic Therapy;DME Instruction;Gait training;Stair training;Therapeutic activities;Therapeutic exercise;Balance training;Orthotic Fit/Training;Patient/family education;Neuromuscular re-education    PT Next Visit Plan Aquatic therapy scheduled for 03-16-21: added additional ex's to HEP; continue to work on strengthening and balance. trial AFO on left LE    PT Home Exercise Plan Access Code: [email or web address]    Consulted and Agree with Plan of Care Patient;Family member/caregiver    Family Member Consulted wife [name]             Patient will benefit from skilled therapeutic intervention in order to improve the following deficits and impairments:  Abnormal gait, Decreased activity tolerance, Decreased balance, Decreased coordination, Decreased strength, Impaired tone, Impaired sensation, Impaired UE functional  use, Impaired vision/preception, Pain  Visit Diagnosis: 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-dominant side (HCC)  Muscle weakness (generalized)  Other abnormalities of gait and mobility     Problem List Patient Active Problem List   Diagnosis Date Noted   Colon cancer screening    Left hemiplegia (HCC) 07/02/2020   Cognitive deficit due to recent stroke 07/02/2020   Dysphagia, post-stroke    Vascular headache    Acute blood loss anemia    AKI (acute kidney injury) (HCC)    Essential hypertension    Seizures (HCC)    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   ICH (intracerebral hemorrhage) (HCC) 06/09/2020   Sprain of anterior talofibular ligament of left ankle 01/15/2020   Hyperlipidemia, on Lipitor 04/01/2017   Alcohol use disorder, mild, abuse 06/30/2016   Morbid obesity (HCC) 06/30/2016   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   Chronic combined systolic and diastolic CHF (congestive heart failure) (HCC) 11/08/2012   HTN (hypertension) 05/25/2012    [name], [name], PT 03/11/2021, 1:10 PM  [facility] [address] [place], [place], [address] Phone: [phone]   Fax:  [phone]  Name: [name] MRN: [id number] Date of Birth: [birth year]

## 2021-03-16 ENCOUNTER — Ambulatory Visit: Payer: BC Managed Care – PPO | Admitting: Physical Therapy

## 2021-03-17 ENCOUNTER — Ambulatory Visit: Payer: BC Managed Care – PPO | Admitting: Occupational Therapy

## 2021-03-21 ENCOUNTER — Encounter: Payer: Self-pay | Admitting: Physical Medicine and Rehabilitation

## 2021-03-21 ENCOUNTER — Other Ambulatory Visit: Payer: Self-pay

## 2021-03-21 ENCOUNTER — Encounter
Payer: BC Managed Care – PPO | Attending: Physical Medicine and Rehabilitation | Admitting: Physical Medicine and Rehabilitation

## 2021-03-21 ENCOUNTER — Ambulatory Visit (HOSPITAL_COMMUNITY)
Admission: RE | Admit: 2021-03-21 | Discharge: 2021-03-21 | Disposition: A | Discharge status: Home / Self Care | Payer: BC Managed Care – PPO | Source: Ambulatory Visit | Attending: Physical Medicine and Rehabilitation | Admitting: Physical Medicine and Rehabilitation

## 2021-03-21 VITALS — BP 115/72 | HR 74 | Ht 70.0 in | Wt 214.0 lb

## 2021-03-21 DIAGNOSIS — G8929 Other chronic pain: Secondary | ICD-10-CM | POA: Insufficient documentation

## 2021-03-21 DIAGNOSIS — I5042 Chronic combined systolic (congestive) and diastolic (congestive) heart failure: Secondary | ICD-10-CM | POA: Diagnosis not present

## 2021-03-21 DIAGNOSIS — R569 Unspecified convulsions: Secondary | ICD-10-CM | POA: Diagnosis not present

## 2021-03-21 DIAGNOSIS — M19012 Primary osteoarthritis, left shoulder: Secondary | ICD-10-CM | POA: Diagnosis not present

## 2021-03-21 DIAGNOSIS — E669 Obesity, unspecified: Secondary | ICD-10-CM | POA: Diagnosis not present

## 2021-03-21 DIAGNOSIS — Z6830 Body mass index (BMI) 30.0-30.9, adult: Secondary | ICD-10-CM | POA: Insufficient documentation

## 2021-03-21 DIAGNOSIS — M25512 Pain in left shoulder: Secondary | ICD-10-CM | POA: Insufficient documentation

## 2021-03-21 DIAGNOSIS — G8194 Hemiplegia, unspecified affecting left nondominant side: Secondary | ICD-10-CM | POA: Insufficient documentation

## 2021-03-21 DIAGNOSIS — I610 Nontraumatic intracerebral hemorrhage in hemisphere, subcortical: Secondary | ICD-10-CM | POA: Diagnosis not present

## 2021-03-21 DIAGNOSIS — S43002A Unspecified subluxation of left shoulder joint, initial encounter: Secondary | ICD-10-CM | POA: Diagnosis not present

## 2021-03-21 MED ORDER — DICLOFENAC SODIUM 1 % EX GEL: 2.0000 g | Freq: Four times a day (QID) | CUTANEOUS | 0 refills | Status: DC

## 2021-03-21 NOTE — Patient Instructions (Addendum)
Foods that may reduce pain: 1) Ginger (especially studied for arthritis)- reduce leukotriene production to decrease inflammation 2) Blueberries- high in phytonutrients that decrease inflammation 3) Salmon- marine omega-3s reduce joint swelling and pain 4) Pumpkin seeds- reduce inflammation 5) dark chocolate- reduces inflammation 6) turmeric- reduces inflammation 7) tart cherries - reduce pain and stiffness 8) extra virgin olive oil - its compound olecanthal helps to block prostaglandins  9) chili peppers- can be eaten or applied topically via capsaicin 10) mint- helpful for headache, muscle aches, joint pain, and itching 11) garlic- reduces inflammation  Link to further information on diet for chronic pain: http://www.randall.com/   Turmeric to reduce inflammation--can be used in cooking or taken as a supplement.  Benefits of turmeric:  -Highly anti-inflammatory  -Increases antioxidants  -Improves memory, attention, brain disease  -Lowers risk of heart disease  -May help prevent cancer  -Decreases pain  -Alleviates depression  -Delays aging and decreases risk of chronic disease  -Consume with black pepper to increase absorption    Turmeric Milk Recipe:  1 cup milk  1 tsp turmeric  1 tsp cinnamon  1 tsp grated ginger (optional)  Black pepper (boosts the anti-inflammatory properties of turmeric).  1 tsp honey   Provided with list of supplements that can help with dyslipidemia: 1) Vitamin B3 500-4,000mg  in divided doses daily (would recommend starting low as can cause uncomfortable facial flushing if started at too high a dose) 2) Phytosterols 2.15 grams daily 3) Fermented soy 30-50 grams daily 4) EGCG (found in green tea): 500-1000mg  daily. [place] of tea 5) Omega-3 fatty acids 3000-5,000mg  daily 6) Flax seed 40 grams daily 7) Monounsaturated fats 20-40 grams daily (olives, olive oil, nuts),  also reduces cardiovascular disease 8) Sesame: 40 grams daily 9) Gamma/delta tocotrienols- a family of unsaturated forms of Vitamin E- 200mg  with dinner 10) Pantethine 900mg  daily in divided doses 11) Resveratrol 250mg  daily 12) N Acetyl Cysteine 2000mg  daily in divided doses 13) Curcumin 2000-5000mg  in divided doses daily 14) Pomegranate juice: 8 ounces daily, also helps to lower blood pressure 15) Pomegranate seeds one cup daily, also helps to lower blood pressure 16) Citrus Bergamot 1000mg  daily, also helps with glucose control and weight loss 17) Vitamin C 500mg  daily 18) Quercetin 500-1000mg  daily 19) Glutathione 20) Probiotics 60-100 billion organisms per day 21) Fiber 22) Oats 23) Aged garlic (can eat as food or supplement of 600-900mg  per day) 24) Chia seeds 25 grams per day 25) Lycopene- carotenoid found in high concentrations in tomatoes. 26) Alpha linolenic acid 27) Flavonoids and anthocyanins 28) Wogonin- flavanoid that enhances reverse cholesterol transport 29) Coenzyme Q10 30) Pantethine- derivative of Vitamin B5: 300mg  three times per day or 450mg  twice per day with or without food 31) Barley and other whole grains 32) Orange juice 33) L- carnitine 34) L- Lysine 35) L- Arginine 36) Almonds 37) Morin 38) Rutin 39) Carnosine 40) Histidine  41) Kaempferol  42) Organosulfur compounds 43) Vitamin E 44) Oleic acid 45) RBO (ferulic acid gammaoryzanol) 46) grape seed extract 47) Red wine 48) Berberine HCL 500mg  daily or twice per day- more effective and with fewer adverse effects that ezetimibe monotherapy 49) red yeast rice 2400- 4800 mg/day 50) chlorella 51) Licorice

## 2021-03-21 NOTE — Progress Notes (Signed)
Subjective:    Patient ID: [name], male    DOB: 1965-10-05, 56 y.o.   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 -starting water therapy soon -pain has been severe at home, as it had been in hospital -Dalton wife purchased 500mg  tablets of tylenol -he has a one week supply of Norco -the benefits from the trigger point injections last week have worn off -pain is severely limiting Dalton function -he is taking Tizanidine and it does not help, does make him sleepy, but Dalton wife is trying to encourage him to stay up during the day -he developed the foot pain a couple of weeks ago -he was wearing a boot at that time so took it off but has still had pain a couple of weeks afterward. -he has not tried anything for this pain -it is worst at night and feels like burning and tingling at Dalton heel. -he has been doing great with therapy and is walking in Dalton home! -Dalton wife asks if he could get a handicap placard.  -the foot is still feeling numb with pins and needles, also painful. He is unsure whether Gabapentin helps- so far nothing he as tried has helped Dalton neuropathy. Dalton balance is also now off and he has been stumbling more. He actually felt that the Gabapentin made Dalton pain worse. He has never tried Cymbalta but is willing to try it.  -Has been able to use bathroom himself! -he is working with therapy right now.  -he was feeling wobbly over the weekend so had to miss Dalton PT session -shoulder is feeling very numb and cannot make a fist -ready to proceed with Qutenza today -Dalton wife asks for refill of Eucerin and Diclofenac and Tizanidine.  -She asks whether he should still be taking Seroquel -he called medical records and records were faxed on December 28th.  -He called our office today  -He needs medical records to process Dalton disability claims -able to walk much better.    2) Lower extremity edema -worsening -not painful -he asks when Dalton next appointment with me is.  -has gotten used to it -therapist was concerned -painful -sometimes feels cold on the left foot -never took the fluid pills  3) Headaches -started last week and felt loss of balance -this stopped him from doing therapy.  -still having tension in head.  -he takes topamax 25mg  HS.   4) Obesity -he has lost 36 lbs!   Family History  Problem Relation Age of Onset   Hypertension Mother        Passed away when she is 65 secondary to hip surgery complications   CAD Mother    Heart attack Mother    Hypertension Father    Diabetes Brother    Stroke Brother    Heart attack Maternal Grandmother    Colon cancer Neg Hx    Esophageal cancer Neg Hx    Pancreatic cancer Neg Hx    Stomach cancer Neg Hx    Colon polyps Neg Hx    Social History   Socioeconomic History   Marital status: Married    Spouse name: Not on file   Number of children: Not on file   Years of education: Not on file   Highest education level: Not on file  Occupational History   Not on file  Tobacco Use   Smoking status: Never  Smokeless tobacco: Never  Vaping Use   Vaping Use: Never used  Substance and Sexual Activity   Alcohol use: Not Currently   Drug use: No   Sexual activity: Yes  Other Topics Concern   Not on file  Social History Narrative   Not on file   Social Determinants of Health   Financial Resource Strain: Not on file  Food Insecurity: Not on file  Transportation Needs: Not on file  Physical Activity: Not on file  Stress: Not on file  Social Connections: Not on file   Past Surgical History:  Procedure Laterality Date   COLONOSCOPY WITH PROPOFOL N/A 12/09/2020   Procedure: COLONOSCOPY WITH PROPOFOL;  Surgeon: Benancio Deeds, MD;  Location: [facility];  Service: Gastroenterology;  Laterality: N/A;   KNEE ARTHROSCOPY Left 04/13/2016   Procedure: ARTHROSCOPY KNEE WITH  DEBRIDEMENT;  Surgeon: Valeria Batman, MD;  Location: [facility];  Service: Orthopedics;  Laterality: Left;   KNEE ARTHROSCOPY WITH MEDIAL MENISECTOMY Right 04/13/2016   Procedure: KNEE ARTHROSCOPY WITH MEDIAL MENISECTOMY;  Surgeon: Valeria Batman, MD;  Location: [facility];  Service: Orthopedics;  Laterality: LEFT not right knee   NASAL FRACTURE SURGERY     SHOULDER SURGERY Left    Past Medical History:  Diagnosis Date   Anxiety    Cataract    removed left eye   Chronic combined systolic and diastolic CHF (congestive heart failure) (HCC)    EF 25-30%   Diastolic dysfunction    Dyslipidemia    Hyperlipidemia    Hypertension    Neuromuscular disorder (HCC)    neuropathy left foot   Nonischemic dilated cardiomyopathy (HCC)    felt secondary to HTN with no ischemia on nuclear stress test 05/2012, EF 25-30%   Seizures (HCC)    last seizure 05-2020- on Keppra   Shortness of breath    Stroke (HCC) 05/2020   There were no vitals taken for this visit.  Opioid Risk Score:   Fall Risk Score:  `1  Depression screen PHQ 2/9  Depression screen [facility] 2/9 09/22/2020 09/14/2020 09/14/2020 07/30/2020  Decreased Interest 0 0 0 0  Down, Depressed, Hopeless 0 0 0 0  PHQ - 2 Score 0 0 0 0  Altered sleeping - 0 - 0  Tired, decreased energy - 0 - 0  Change in appetite - 0 - 0  Feeling bad or failure about yourself  - 0 - 0  Trouble concentrating - 0 - 0  Moving slowly or fidgety/restless - 0 - 0  Suicidal thoughts - 0 - 0  PHQ-9 Score - 0 - 0  Difficult doing work/chores Not difficult at all - - Not difficult at all    Review of Systems     Objective:   Physical Exam Gen: no distress, normal appearing, 56, 214 lbs HEENT: oral mucosa pink and moist, NCAT Cardio: Reg rate Chest: normal effort, normal rate of breathing Abd: soft, non-distended Ext: no edema Psych: pleasant, normal affect Skin: intact Neuro: Alert and oriented x3 Musculoskeletal:  Ambulating with impaired gait both with and without assistive device     Assessment & Plan:  [name] is a 56 year old man presenting today for f/u of left hemiplegia, right sided low back pain, and left sided foot pain following stroke.  1) Right sided low back pain: -discussed with patient and wife that Xrs show spinal degeneration -pain appears to be more myofascial -trigger point injections with lidocaine provided <1 week  of benefit -Would like to try Botox injections for longer relief as this pain is greatly inhibiting patient's function and quality of life -continue heat and lidocaine patch and home physical and occupational therapy -Can take tylenol 500mg  three times per day -minimize use of Norco as much as possible as not benefiting and is a high risk opioid -can continue tizandine as needed.   2) left sided foot pain -stop Gabapentin  -start Cymbalta 20mg  daily, discussed side effects of nausea.  -recommended icing 15 minutes three times per day -hold off on therapy until neuropathy improves -can keep compression garment and boot off since these were worsening Dalton pain -Will not repeat Qutenza since it did not did help foot pain -Turmeric to reduce inflammation--can be used in cooking or taken as a supplement.  Benefits of turmeric:  -Highly anti-inflammatory  -Increases antioxidants  -Improves memory, attention, brain disease  -Lowers risk of heart disease  -May help prevent cancer  -Decreases pain  -Alleviates depression  -Delays aging and decreases risk of chronic disease  -Consume with black pepper to increase absorption    3) CVA -provided with a handicap placard.  --progress to quad walker -face to face for AFO today  4) Left sided arm pain: -has felt numbness and weakness in Dalton upper extremity.  -refilled tizanidine, lidocaine patch, and diclofenac gel -can consider repeating Qutenza as it did help with Dalton shoulder pain.  -continue tylenol  before sleep  5) Agitation: resolved, may discontinue seroquel.  6) Bilateral lower extremity edema -recommended avoiding added salt -prescribed lasix 20mg  daily prn  7) HTN 128/68 -commended on improvements in blood pressure! -Advised checking BP daily at home and logging results to bring into follow-up appointment with PCP and myself. -Reviewed BP meds today.  -Advised regarding healthy foods that can help lower blood pressure and provided with a list: 1) citrus foods- high in vitamins and minerals 2) salmon and other fatty fish - reduces inflammation and oxylipins 3) swiss chard (leafy green)- high level of nitrates 4) pumpkin seeds- one of the best natural sources of magnesium 5) Beans and lentils- high in fiber, magnesium, and potassium 6) Berries- high in flavonoids 7) Amaranth (whole grain, can be cooked similarly to rice and oats)- high in magnesium and fiber 8) Pistachios- even more effective at reducing BP than other nuts 9) Carrots- high in phenolic compounds that relax blood vessels and reduce inflammation 10) Celery- contain phthalides that relax tissues of arterial walls 11) Tomatoes- can also improve cholesterol and reduce risk of heart disease 12) Broccoli- good source of magnesium, calcium, and potassium 13) Greek yogurt: high in potassium and calcium 14) Herbs and spices: Celery seed, cilantro, saffron, lemongrass, black cumin, ginseng, cinnamon, cardamom, sweet basil, and ginger 15) Chia and flax seeds- also help to lower cholesterol and blood sugar 16) Beets- high levels of nitrates that relax blood vessels  17) spinach and bananas- high in potassium  -Provided lise of supplements that can help with hypertension:  1) magnesium: one high quality brand is Bioptemizers since it contains all 7 types of magnesium, otherwise over the counter magnesium gluconate 400mg  is a good option 2) B vitamins 3) vitamin D 4) potassium 5) CoQ10 6) L-arginine 7) Vitamin C 8)  Beetroot -Educated that goal BP is 120/80. -Made goal to incorporate some of the above foods into diet.    8) Obesity -commended on 38 pound weight loss! Discussed how this can help with Dalton pain as well -current BMI is 30.71  9) Left shoulder pain -XR ordered -Discussed current symptoms of pain and history of pain.  -Discussed benefits of exercise in reducing pain. -Discussed following foods that may reduce pain: 1) Ginger (especially studied for arthritis)- reduce leukotriene production to decrease inflammation 2) Blueberries- high in phytonutrients that decrease inflammation 3) Salmon- marine omega-3s reduce joint swelling and pain 4) Pumpkin seeds- reduce inflammation 5) dark chocolate- reduces inflammation 6) turmeric- reduces inflammation 7) tart cherries - reduce pain and stiffness 8) extra virgin olive oil - its compound olecanthal helps to block prostaglandins  9) chili peppers- can be eaten or applied topically via capsaicin 10) mint- helpful for headache, muscle aches, joint pain, and itching 11) garlic- reduces inflammation  Link to further information on diet for chronic pain: http://www.bray.com/   Turmeric to reduce inflammation--can be used in cooking or taken as a supplement.  Benefits of turmeric:  -Highly anti-inflammatory  -Increases antioxidants  -Improves memory, attention, brain disease  -Lowers risk of heart disease  -May help prevent cancer  -Decreases pain  -Alleviates depression  -Delays aging and decreases risk of chronic disease  -Consume with black pepper to increase absorption    Turmeric Milk Recipe:  1 cup milk  1 tsp turmeric  1 tsp cinnamon  1 tsp grated ginger (optional)  Black pepper (boosts the anti-inflammatory properties of turmeric).  1 tsp honey

## 2021-03-23 ENCOUNTER — Encounter: Payer: Self-pay | Admitting: Adult Health

## 2021-03-23 ENCOUNTER — Encounter
Payer: BC Managed Care – PPO | Attending: Physical Medicine and Rehabilitation | Admitting: Physical Medicine and Rehabilitation

## 2021-03-23 ENCOUNTER — Other Ambulatory Visit: Payer: Self-pay

## 2021-03-23 ENCOUNTER — Ambulatory Visit: Payer: BC Managed Care – PPO | Admitting: Adult Health

## 2021-03-23 VITALS — BP 136/75 | HR 70 | Ht 70.0 in | Wt 214.0 lb

## 2021-03-23 DIAGNOSIS — I619 Nontraumatic intracerebral hemorrhage, unspecified: Secondary | ICD-10-CM

## 2021-03-23 DIAGNOSIS — G441 Vascular headache, not elsewhere classified: Secondary | ICD-10-CM

## 2021-03-23 DIAGNOSIS — G8929 Other chronic pain: Secondary | ICD-10-CM | POA: Insufficient documentation

## 2021-03-23 DIAGNOSIS — R569 Unspecified convulsions: Secondary | ICD-10-CM

## 2021-03-23 DIAGNOSIS — M25512 Pain in left shoulder: Secondary | ICD-10-CM | POA: Insufficient documentation

## 2021-03-23 DIAGNOSIS — G8194 Hemiplegia, unspecified affecting left nondominant side: Secondary | ICD-10-CM | POA: Insufficient documentation

## 2021-03-23 MED ORDER — TOPIRAMATE 50 MG PO TABS: 50.0000 mg | ORAL_TABLET | Freq: Two times a day (BID) | ORAL | 5 refills | Status: DC

## 2021-03-23 NOTE — Progress Notes (Signed)
[facility] [address] [place]. 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  Returns today for follow-up visit unaccompanied.  Overall stable from stroke standpoint without new stroke/TIA symptoms.  Reports residual left-sided weakness with left arm and foot neuropathic pain and intermittent blurred vision in left periphery bilaterally although improving. Seen by PMR 2 days ago - continues to manage pain. He frustrated by continued pain and is looking at different home options to help with pain such as tens units. Left Shoulder xray showed subluxation and prominent acromioclavicular and glenohumeral degenerative change.  Continues working with PT/OT. Awaiting AFO brace, recently ordered by PMR. Ambulates with quad cane, no recent falls. Compliant on Keppra, denies side effects, no seizure activity noted.  Compliant on topiramate 50mg  qhs for headaches that were present during hospitalization, denies side effects, does report some worsening headaches over the past 2 weeks, reports 2 headaches left frontal area associated with blurred vision in left eye and imbalance (leaning backwards), typically will resolve shortly after taking Tylenol, denies photophobia, phonophobia, or N/V. Describes as dull pain, denies pulsating, pressure or stabbing sensation.  He is routinely followed by Dr. Hyacinth [name] ophthalmologist but has not had evaluation since worsening headaches with visual issues. Compliant on  atorvastatin without side effects.  Blood pressure today 136/75.  No further concerns at this time.     History provided for reference purposes only Initial visit 09/01/2020 JM: [name] is being seen for hospital follow-up accompanied by his wife, [name].  Reports residual left-sided weakness, left shoulder pain and left foot pain, right side back and hip pain, and occasional blurred vision in left periphery. Currently working with [facility] PT/OT/SLP.  He ambulates with hemiwalker during therapy sessions otherwise transfers via wheelchair. Seen by PMR (8/2) via telemed visit and currently on Tylenol, Norco, tizanidine, lidocaine patch and gabapentin. Has f/u visit 8/26 with OV note mentioning trial of Botox.  Continues to experience left foot numbness and pain worse at night -gabapentin helps some.  Denies new stroke/TIA symptoms.  Remains on Keppra 500 mg twice daily tolerating without seizure activity.  Remains on topiramate for headache prophylaxis.  Compliant on atorvastatin 40 mg daily -denies side effects.  Blood pressure today 142/81 -routinely monitors at home and has been stable. Per wife, plans on being seen by urology for voiding difficulties post stroke and current use of tamsulosin.  No further concerns at this time.  Stroke admission 06/09/2020 [name] is a 56 y.o. male with history of hypertension, obesity, cardiomyopathy, hyperlipidemia, CHF admitted on 06/09/2020 for right-sided gaze, right-sided weakness numbness and fall.  Personally reviewed hospitalization pertinent progress notes, lab work and imaging.  Evaluated by Dr. Roda Shutters for right thalamic ICH with IVH with midline shift and edema likely due to uncontrolled HTN.  BP treated with Cleviprex with adjustments made to home regimen. MRA no vascular malformations. CUS unremarkable.  EF 65 to 70%.  UDS negative.  LDL 78 on atorvastatin 40 mg daily.  A1c 5.5.  Other stroke risk factors include EtOH use, obesity and suspected sleep apnea.   Stroke team signed off on 5/24 but reevaluated on 5/27 due to altered mental status unresponsive to verbal and tactile stimuli, neck jerking to the left and left gaze deviation likely partial complex seizure.  Patient on Depakote for headache prophylaxis with Depakote level 45. CT head stable ICH and IVH with mild hydrocephalus.  EEG and LTM EEG no evidence of seizures.  Placed on Keppra 500 mg twice daily for seizure prophylaxis.  No additional seizures.  Hospital course complicated by sundowning placed on Seroquel, AKI and headaches.  Therapies recommended CIR for residual dense left hemiplegia, right gaze preference, dysphagia, fatigue and visual deficits. CIR admission 5/31-6/29 -significant for left shoulder pain due to mild subluxation, right-sided low back and hip pain and headaches.  PERTINENT IMAGING/LABS  Per hospitalization 06/09/2020 CT head showed right thalamic ICH with IVH with 4 mm midline shift Repeat CT head no hematoma expansion MRI stable right thalamic ICH with IVH and midline shift MRA no aneurysm or AVM CT repeat x 2 stable right thalamic hemorrhage with mild intraventricular extension. 2D Echo EF 65-70% CUS unremarkable LDL 78 HgbA1c 5.5      ROS:   14 system review of systems performed and negative with exception of those listed in HPI  PMH:  Past Medical History:  Diagnosis Date   Anxiety    Cataract    removed left eye   Chronic combined systolic and diastolic CHF (congestive heart failure) (HCC)    EF 25-30%   Diastolic dysfunction    Dyslipidemia    Hyperlipidemia    Hypertension    Neuromuscular disorder (HCC)    neuropathy left foot   Nonischemic dilated cardiomyopathy (HCC)    felt secondary to HTN with no ischemia on nuclear stress test 05/2012, EF 25-30%   Seizures (HCC)    last seizure 05-2020- on Keppra   Shortness of breath    Stroke (HCC) 05/2020    PSH:  Past Surgical History:  Procedure Laterality Date   COLONOSCOPY WITH PROPOFOL N/A  12/09/2020   Procedure: COLONOSCOPY WITH PROPOFOL;  Surgeon: Benancio Deeds, MD;  Location: [facility];  Service: Gastroenterology;  Laterality: N/A;   KNEE ARTHROSCOPY Left 04/13/2016   Procedure: ARTHROSCOPY KNEE WITH DEBRIDEMENT;  Surgeon: Valeria Batman, MD;  Location: [facility];  Service: Orthopedics;  Laterality: Left;   KNEE ARTHROSCOPY WITH MEDIAL MENISECTOMY Right 04/13/2016   Procedure: KNEE ARTHROSCOPY WITH MEDIAL MENISECTOMY;  Surgeon: Valeria Batman, MD;  Location: [facility];  Service: Orthopedics;  Laterality: LEFT not right knee   NASAL FRACTURE SURGERY     SHOULDER SURGERY Left     Social History:  Social History   Socioeconomic History   Marital status: Married    Spouse name: Not on file   Number of children: Not on file   Years of education: Not on file   Highest education level: Not on file  Occupational History   Not on file  Tobacco Use   Smoking status: Never   Smokeless tobacco: Never  Vaping Use   Vaping Use: Never used  Substance and Sexual Activity   Alcohol use: Not Currently   Drug use: No   Sexual activity: Yes  Other Topics Concern   Not on file  Social History Narrative   Not on file   Social Determinants  of Health   Financial Resource Strain: Not on file  Food Insecurity: Not on file  Transportation Needs: Not on file  Physical Activity: Not on file  Stress: Not on file  Social Connections: Not on file  Intimate Partner Violence: Not on file    Family History:  Family History  Problem Relation Age of Onset   Hypertension Mother        Passed away when she is 58 secondary to hip surgery complications   CAD Mother    Heart attack Mother    Hypertension Father    Diabetes Brother    Stroke Brother    Heart attack Maternal Grandmother    Colon cancer Neg Hx    Esophageal cancer Neg Hx    Pancreatic cancer Neg Hx    Stomach cancer Neg Hx    Colon polyps Neg Hx     Medications:    Current Outpatient Medications on File Prior to Visit  Medication Sig Dispense Refill   amLODipine (NORVASC) 10 MG tablet Take 1 tablet (10 mg total) by mouth daily. 90 tablet 0   cloNIDine (CATAPRES) 0.1 MG tablet Take 1 tablet (0.1 mg total) by mouth 3 (three) times daily. 90 tablet 0   diclofenac Sodium (VOLTAREN) 1 % GEL Apply 2 g topically 4 (four) times daily. 200 g 0   folic acid (FOLVITE) 1 MG tablet Take 1 tablet (1 mg total) by mouth daily. 90 tablet 3   gabapentin (NEURONTIN) 300 MG capsule Take 1 capsule (300 mg total) by mouth 3 (three) times daily. 90 capsule 11   labetalol (NORMODYNE) 100 MG tablet Take 1 tablet by mouth twice daily 180 tablet 0   levETIRAcetam (KEPPRA) 500 MG tablet Take 1 tablet (500 mg total) by mouth 2 (two) times daily. 180 tablet 3   losartan (COZAAR) 50 MG tablet Take 1 tablet (50 mg total) by mouth 2 (two) times daily. 180 tablet 0   Multiple Vitamins-Minerals (CERTAVITE/ANTIOXIDANTS) TABS Take 1 tablet by mouth daily. 30 tablet 0   tamsulosin (FLOMAX) 0.4 MG CAPS capsule TAKE 1 CAPSULE BY MOUTH ONCE DAILY AFTER SUPPER 30 capsule 0   thiamine 100 MG tablet Take 1 tablet (100 mg total) by mouth daily. 30 tablet 0   topiramate (TOPAMAX) 25 MG tablet Take 2 tablets by mouth once daily 180 tablet 0   No current facility-administered medications on file prior to visit.    Allergies:  No Known Allergies    OBJECTIVE:  Physical Exam  Vitals:   03/23/21 1331  BP: 136/75  Pulse: 70  Weight: 214 lb (97.1 kg)  Height: 5\' 10"  (1.778 m)   Body mass index is 30.71 kg/m. No results found.  General: well developed, well nourished, very pleasant middle-aged African-American male, seated, in no evident distress Head: head normocephalic and atraumatic.   Neck: supple with no carotid or supraclavicular bruits Cardiovascular: regular rate and rhythm, no murmurs Musculoskeletal: no deformity; pitting edema L>R chronic  Skin:  no rash/petichiae Vascular:   Normal pulses all extremities   Neurologic Exam Mental Status: Awake and fully alert.  Fluent speech and language.  Oriented to place and time. Recent memory mildly impaired and remote memory intact. Attention span, concentration and fund of knowledge appropriate during visit. Mood and affect appropriate.  Cranial Nerves: Pupils equal, briskly reactive to light. Extraocular movements full without nystagmus. Visual fields full to confrontation. Hearing intact.  Mild left lower facial weakness.  Tongue, palate moves normally and symmetrically.  Motor:  Normal strength, bulk and tone right upper and lower extremity LUE: 4/5 deltoid, elbow flexion and extension and handgrip  LLE: 4/5 hip flexor, KF and KE, 3/5 ADF and APF Sensory.:  decreased sensation LLE compared to RLE, intact BUE Coordination: Rapid alternating movements normal in all extremities on right side. Finger-to-nose and heel-to-shin performed accurately on right side. Gait and Station: stands from seated position with mild difficulty.  Gait demonstrates hemiplegic circumduction gait with use of quad cane.  Tandem walk and heel toe not attempted Reflexes: Brisk on left side, 1+ right side.  Toes downgoing.          ASSESSMENT: Laterrian Hevener [name] is a 56 y.o. year old male with right thalamic ICH with IVH on 06/09/2020 like secondary to uncontrolled HTN with seizure activity on 5/27. Vascular risk factors include HTN, HLD, cardiomyopathy, CHF, and etoh use .      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 Okay to start aquatic therapy with PT.  On disability managed by PCP Continue to follow with PMR as scheduled for ongoing left-sided neuropathic pain  Discussed secondary stroke prevention measures and importance of close PCP follow up for  aggressive stroke risk factor management including HTN with BP goal<130/90 and HLD with LDL goal<70. I have gone over the pathophysiology of stroke, warning signs and symptoms, risk factors and their management in some detail with instructions to go to the closest emergency room for symptoms of concern. Headache, new onset: associated with blurred vision and imbalance. Headache present initially after ICH but resolved shortly after discharge. Reoccurrence over the past 2 weeks. Exam unremarkable for new findings and noted improvement since prior visit. Recommend repeat MR brain w/wo contrast to rule out any new abnormality.  Recommend increasing topiramate to 50 mg twice daily. If imaging unremarkable and vision symptoms persist, would recommend follow-up with ophthalmologist.  Discussed precautions of calling 911 immediately with any worsening headache or new stroke/TIA symptoms Seizure, post stroke: continue keppra 500mg  twice daily for seizure prevention - refill provided    Follow up in 5 months or call earlier if needed    CC:  PCP: , NP    I spent 38 minutes of face-to-face and non-face-to-face time with patient.  This included previsit chart review, lab review, study review, order entry, electronic health record documentation, patient and wife education and discussion regarding prior stroke with residual deficits, post stroke seizures and ongoing use of Keppra, new onset headaches and further evaluation and treatment plan, secondary stroke prevention measures and importance of managing stroke risk factors and answered all other questions to patients satisfaction  Rema Fendt, AGNP-BC  [facility] [address] [place], [place]  Phone [phone] Fax [phone] Note: This document was prepared with digital dictation and possible smart phrase technology. Any transcriptional errors that result from this process are  unintentional.

## 2021-03-23 NOTE — Patient Instructions (Signed)
You will be called to schedule an MRI of your brain due to recurrent headaches ?If your vision symptoms persist, would recommend you be evaluated by your eye doctor  ? ?Increase toapamx to 50mg  twice daily (currently should be taking 50mg  daily) for headaches ? ?Continue to work with therapies - continue to follow with physical medicine and rehab for pain management  ? ?Continue keppra 500mg  twice daily for seizure prevention ? ?Continue to follow up with PCP regarding cholesterol and blood pressure management  ?Maintain strict control of hypertension with blood pressure goal below 130/90, diabetes with hemoglobin A1c goal below 7.0 % and cholesterol with LDL cholesterol (bad cholesterol) goal below 70 mg/dL.  ? ?Signs of a Stroke? Follow the BEFAST method:  ?Balance Watch for a sudden loss of balance, trouble with coordination or vertigo ?Eyes Is there a sudden loss of vision in one or both eyes? Or double vision?  ?Face: Ask the person to smile. Does one side of the face droop or is it numb?  ?Arms: Ask the person to raise both arms. Does one arm drift downward? Is there weakness or numbness of a leg? ?Speech: Ask the person to repeat a simple phrase. Does the speech sound slurred/strange? Is the person confused ? ?Time: If you observe any of these signs, call 911. ? ? ? ? ?Followup in the future with me in 5 months or call earlier if needed ? ? ? ? ? ? ?Thank you for coming to see at [facility]. I hope we have been able to provide you high quality care today. ? ?You may receive a patient satisfaction survey over the next few weeks. We would appreciate your feedback and comments so that we may continue to improve ourselves and the health of our patients. ? ?

## 2021-03-24 ENCOUNTER — Encounter: Payer: Self-pay | Admitting: Occupational Therapy

## 2021-03-24 ENCOUNTER — Ambulatory Visit: Payer: BC Managed Care – PPO | Attending: Family | Admitting: Physical Therapy

## 2021-03-24 ENCOUNTER — Ambulatory Visit: Payer: BC Managed Care – PPO | Admitting: Occupational Therapy

## 2021-03-24 ENCOUNTER — Other Ambulatory Visit: Payer: Self-pay

## 2021-03-24 ENCOUNTER — Encounter: Payer: Self-pay | Admitting: Physical Therapy

## 2021-03-24 DIAGNOSIS — R41842 Visuospatial deficit: Secondary | ICD-10-CM | POA: Insufficient documentation

## 2021-03-24 DIAGNOSIS — R2681 Unsteadiness on feet: Secondary | ICD-10-CM | POA: Insufficient documentation

## 2021-03-24 DIAGNOSIS — I69354 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-dominant side: Secondary | ICD-10-CM

## 2021-03-24 DIAGNOSIS — G8929 Other chronic pain: Secondary | ICD-10-CM | POA: Diagnosis not present

## 2021-03-24 DIAGNOSIS — R278 Other lack of coordination: Secondary | ICD-10-CM | POA: Insufficient documentation

## 2021-03-24 DIAGNOSIS — M25512 Pain in left shoulder: Secondary | ICD-10-CM

## 2021-03-24 DIAGNOSIS — M6281 Muscle weakness (generalized): Secondary | ICD-10-CM | POA: Insufficient documentation

## 2021-03-24 DIAGNOSIS — I69154 Hemiplegia and hemiparesis following nontraumatic intracerebral hemorrhage affecting left non-dominant side: Secondary | ICD-10-CM | POA: Insufficient documentation

## 2021-03-24 DIAGNOSIS — R2689 Other abnormalities of gait and mobility: Secondary | ICD-10-CM | POA: Diagnosis not present

## 2021-03-24 NOTE — Therapy (Incomplete)
OUTPATIENT PHYSICAL THERAPY TREATMENT NOTE   Patient Name: [name] MRN: [id number] DOB:1965-11-28, 56 y.o., male Today's Date: 03/24/2021  PCP: Rema Fendt, NP REFERRING PROVIDER: Rema Fendt, NP    Past Medical History:  Diagnosis Date   Anxiety    Cataract    removed left eye   Chronic combined systolic and diastolic CHF (congestive heart failure) (HCC)    EF 25-30%   Diastolic dysfunction    Dyslipidemia    Hyperlipidemia    Hypertension    Neuromuscular disorder (HCC)    neuropathy left foot   Nonischemic dilated cardiomyopathy (HCC)    felt secondary to HTN with no ischemia on nuclear stress test 05/2012, EF 25-30%   Seizures (HCC)    last seizure 05-2020- on Keppra   Shortness of breath    Stroke (HCC) 05/2020   Past Surgical History:  Procedure Laterality Date   COLONOSCOPY WITH PROPOFOL N/A 12/09/2020   Procedure: COLONOSCOPY WITH PROPOFOL;  Surgeon: Benancio Deeds, MD;  Location: [facility];  Service: Gastroenterology;  Laterality: N/A;   KNEE ARTHROSCOPY Left 04/13/2016   Procedure: ARTHROSCOPY KNEE WITH DEBRIDEMENT;  Surgeon: Valeria Batman, MD;  Location: [facility];  Service: Orthopedics;  Laterality: Left;   KNEE ARTHROSCOPY WITH MEDIAL MENISECTOMY Right 04/13/2016   Procedure: KNEE ARTHROSCOPY WITH MEDIAL MENISECTOMY;  Surgeon: Valeria Batman, MD;  Location: [facility];  Service: Orthopedics;  Laterality: LEFT not right knee   NASAL FRACTURE SURGERY     SHOULDER SURGERY Left    Patient Active Problem List   Diagnosis Date Noted   Colon cancer screening    Left hemiplegia (HCC) 07/02/2020   Cognitive deficit due to recent stroke 07/02/2020   Dysphagia, post-stroke    Vascular headache    Acute blood loss anemia    AKI (acute kidney injury) (HCC)    Essential hypertension    Seizures (HCC)    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   ICH (intracerebral hemorrhage) (HCC) 06/09/2020   Sprain of  anterior talofibular ligament of left ankle 01/15/2020   Hyperlipidemia, on Lipitor 04/01/2017   Alcohol use disorder, mild, abuse 06/30/2016   Morbid obesity (HCC) 06/30/2016   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   Chronic combined systolic and diastolic CHF (congestive heart failure) (HCC) 11/08/2012   HTN (hypertension) 05/25/2012    REFERRING DIAG: ***  THERAPY DIAG:  No diagnosis found.  PERTINENT HISTORY: ***  PRECAUTIONS: ***  SUBJECTIVE: Pt stated he is having MRI on Rt shoulder because the shoulder is subluxed - saw Ihor Austin, NP yesterday and she ordered it   PAIN:  Are you having pain? Yes NPRS scale: 5/10 - worse with movement Pain location: shoulder Pain orientation: Right  PAIN TYPE: {type:313116} Pain description: {PAIN DESCRIPTION:21022940}  Aggravating factors: *** Relieving factors: ***    TODAY'S TREATMENT:  ***   PATIENT EDUCATION: Education details: *** Person educated: {Person educated:25204} Education method: {Education Method:25205} Education comprehension: {Education Comprehension:25206}   HOME EXERCISE PROGRAM: ***   PT Short Term Goals - 03/11/21 1307       PT SHORT TERM GOAL #1   Title Pt will improve Berg balance test score by at least 4 points to demo improvement in balance.  Increase Berg score to >/= 49/56 (Target date for all STG's 03-11-21)    Baseline 01/06/21: initial score set this date is 40/56;  46/56 on 02-10-21    Time 4    Period Weeks  Status New    Target Date 03/11/21      PT SHORT TERM GOAL #2   Title Improve TUG score from 38.87 secs to </= 33 secs with use of hemiwalker to demo improvement in functional mobility.; Upgraded STG - TUG score </= 26 secs with hemiwalker    Baseline 01/06/21: unable to reassess due to time contraints, 38.87 sec's at baseline;  29.53 secs on 02-10-21 with hemiwalker    Time 4    Period Weeks    Status New    Target Date 03/11/21      PT SHORT TERM GOAL #3    Title Amb. with SBQC 230' with CGA on flat, even surface for increased community accessibility.;  Amb. 460' with SBQC with SBA    Baseline 350' with SBQC on 02-10-21    Time 4    Period Weeks    Status New    Target Date 03/11/21      PT SHORT TERM GOAL #4   Title Assess foot up brace vs. AFO for LLE for safety with amb.    Time 4    Period Weeks    Status New    Target Date 03/11/21              PT Long Term Goals - 03/11/21 1307       PT LONG TERM GOAL #1   Title Improve Berg balance test score by at least 8 points for reduced fall risk.  Increase Berg score to >/= 52/56 for reduced fall risk.    Baseline improved 6 points from 40/56 on 01-06-21 to 46/56 on 02-10-21    Time 8    Period Weeks    Status New    Target Date 04/08/21      PT LONG TERM GOAL #2   Title Increase gait velocity from .68 ft/sec to >/= 1.4 ft/sec with hemiwalker for increased gait efficiency.  Upgraded LTG - increase gait speed to >/= 1.6 ft/sec with [facility]    Baseline 47.78 secs = .68 ft/sec;     02-10-21:   26.78 secs = 1.22 ft/sec with hemiwalker    Time 8    Period Weeks    Status New    Target Date 04/08/21      PT LONG TERM GOAL #3   Title Improve TUG score to </= 30 secs with hemiwalker to reduce fall risk and to demo incr. functional mobility. Upgraded LTG - score </=24 secs with SBQC    Baseline 38.87 secs with hemiwalker;   02-10-21--  29.53 secs with hemiwalker    Time 8    Period Weeks    Status New    Target Date 04/08/21      PT LONG TERM GOAL #4   Title Amb. 30' without use of assistive device with SBA for household amb. Upgraded goal - modified independent household amb. without device    Baseline hemiwalker - 54' with SBA to CGA;  30' with CGA as Lt foot does not clear floor occasionally    Time 8    Period Weeks    Status New    Target Date 04/08/21      PT LONG TERM GOAL #5   Title Amb. 500' with SPC with SBA for incr. community accessibility. Upgraded LTG - amb. 550' with  SBQC on flat, even surface with SBA    Baseline 350' with SBQC on 02-10-21    Time 8    Period Weeks  Status New    Target Date 04/08/21      PT LONG TERM GOAL #6   Title Perform floor to stand transfer with UE support on object with SBA.    Time 8    Period Weeks    Status On-going    Target Date 04/08/21              Plan - 03/24/21 1411     Personal Factors and Comorbidities Behavior Pattern;Comorbidity 2;Profession;Transportation    Comorbidities cardiomyopathy, systolic & diastolic CHF, ICH, seizures    Examination-Activity Limitations Bend;Carry;Lift;Stand;Stairs;Squat;Locomotion Level;Transfers;Sleep;Bed Mobility    Examination-Participation Restrictions Cleaning;Community Activity;Driving;Shop;Laundry;Occupation;Meal Prep;Yard Work    Conservation officer, historic buildings Evolving/Moderate complexity    Rehab Potential Good    PT Frequency 2x / week    PT Duration 8 weeks    PT Treatment/Interventions ADLs/Self Care Home Management;Aquatic Therapy;DME Instruction;Gait training;Stair training;Therapeutic activities;Therapeutic exercise;Balance training;Orthotic Fit/Training;Patient/family education;Neuromuscular re-education    PT Next Visit Plan Aquatic therapy scheduled for 03-16-21: added additional ex's to HEP; continue to work on strengthening and balance. trial AFO on left LE    PT Home Exercise Plan Access Code: [email or web address]    Consulted and Agree with Plan of Care Patient;Family member/caregiver    Family Member Consulted wife Ave Filter, [place] 03/24/2021, 2:13 PM

## 2021-03-24 NOTE — Progress Notes (Signed)
Subjective:    Patient ID: [name], male    DOB: [birth year], 56 y.o.   MRN: [id number]  HPI An audio/video tele-health visit is felt to be the most appropriate encounter for this patient at this time. This is a follow up tele-visit via phone. The patient is at home. MD is at office. Prior to scheduling this appointment, our staff discussed the limitations of evaluation and management by telemedicine and the availability of in-person appointments. The patient expressed understanding and agreed to proceed.   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  1) CVA -starting water therapy soon'-incredible improvement in ambulation! -pain has been severe at home, as it had been in hospital -his wife purchased  tablets of tylenol -he has a one week supply of Norco -the benefits from the trigger point injections last week have worn off -pain is severely limiting his function -he is taking Tizanidine and it does not help, does make him sleepy, but his wife is trying to encourage him to stay up during the day -he developed the foot pain a couple of weeks ago -he was wearing a boot at that time so took it off but has still had pain a couple of weeks afterward. -he has not tried anything for this pain -it is worst at night and feels like burning and tingling at his heel. -he has been doing great with therapy and is walking in his home! -his wife asks if he could get a handicap placard.  -the foot is still feeling numb with pins and needles, also painful. He is unsure whether Gabapentin helps- so far nothing he as tried has helped his neuropathy. His balance is also now off and he has been stumbling more. He actually felt that the Gabapentin made his pain worse. He has never tried Cymbalta but is willing to try it.  -Has been able to use bathroom himself! -he is working with  therapy right now.  -he was feeling wobbly over the weekend so had to miss his PT session -shoulder is feeling very numb and cannot make a fist -ready to proceed with Qutenza today -His wife asks for refill of Eucerin and Diclofenac and Tizanidine.  -She asks whether he should still be taking Seroquel -he called medical records and records were faxed on December 28th.  -He called our office today  -He needs medical records to process his disability claims -able to walk much better.   2) Lower extremity edema -worsening -not painful -he asks when his next appointment with me is.  -has gotten used to it -therapist was concerned -painful -sometimes feels cold on the left foot -never took the fluid pills  3) Headaches -started last week and felt loss of balance -this stopped him from doing therapy.  -still having tension in head.  -he takes topamax  HS.   4) Obesity -he has lost 36 lbs!  5) Left shoulder pain -He was able to get his XR done.   Family History  Problem Relation Age of Onset   Hypertension Mother        Passed away when she is 77 secondary to hip surgery complications   CAD Mother    Heart attack Mother    Hypertension Father    Diabetes Brother    Stroke Brother    Heart attack Maternal Grandmother    Colon cancer Neg Hx  Esophageal cancer Neg Hx    Pancreatic cancer Neg Hx    Stomach cancer Neg Hx    Colon polyps Neg Hx    Social History   Socioeconomic History   Marital status: Married    Spouse name: Not on file   Number of children: Not on file   Years of education: Not on file   Highest education level: Not on file  Occupational History   Not on file  Tobacco Use   Smoking status: Never   Smokeless tobacco: Never  Vaping Use   Vaping Use: Never used  Substance and Sexual Activity   Alcohol use: Not Currently   Drug use: No   Sexual activity: Yes  Other Topics Concern   Not on file  Social History Narrative   Not on file    Social Determinants of Health   Financial Resource Strain: Not on file  Food Insecurity: Not on file  Transportation Needs: Not on file  Physical Activity: Not on file  Stress: Not on file  Social Connections: Not on file   Past Surgical History:  Procedure Laterality Date   COLONOSCOPY WITH PROPOFOL N/A 12/09/2020   Procedure: COLONOSCOPY WITH PROPOFOL;  Surgeon: Benancio Deeds, MD;  Location: [facility];  Service: Gastroenterology;  Laterality: N/A;   KNEE ARTHROSCOPY Left 04/13/2016   Procedure: ARTHROSCOPY KNEE WITH DEBRIDEMENT;  Surgeon: Valeria Batman, MD;  Location: [facility];  Service: Orthopedics;  Laterality: Left;   KNEE ARTHROSCOPY WITH MEDIAL MENISECTOMY Right 04/13/2016   Procedure: KNEE ARTHROSCOPY WITH MEDIAL MENISECTOMY;  Surgeon: Valeria Batman, MD;  Location: [facility];  Service: Orthopedics;  Laterality: LEFT not right knee   NASAL FRACTURE SURGERY     SHOULDER SURGERY Left    Past Medical History:  Diagnosis Date   Anxiety    Cataract    removed left eye   Chronic combined systolic and diastolic CHF (congestive heart failure) (HCC)    EF 25-30%   Diastolic dysfunction    Dyslipidemia    Hyperlipidemia    Hypertension    Neuromuscular disorder (HCC)    neuropathy left foot   Nonischemic dilated cardiomyopathy (HCC)    felt secondary to HTN with no ischemia on nuclear stress test 05/2012, EF 25-30%   Seizures (HCC)    last seizure 05-2020- on Keppra   Shortness of breath    Stroke (HCC) 05/2020   There were no vitals taken for this visit.  Opioid Risk Score:   Fall Risk Score:  `1  Depression screen PHQ 2/9  Depression screen [facility] 2/9 03/21/2021 09/22/2020 09/14/2020 09/14/2020 07/30/2020  Decreased Interest 0 0 0 0 0  Down, Depressed, Hopeless 0 0 0 0 0  PHQ - 2 Score 0 0 0 0 0  Altered sleeping - - 0 - 0  Tired, decreased energy - - 0 - 0  Change in appetite - - 0 - 0  Feeling bad or failure about  yourself  - - 0 - 0  Trouble concentrating - - 0 - 0  Moving slowly or fidgety/restless - - 0 - 0  Suicidal thoughts - - 0 - 0  PHQ-9 Score - - 0 - 0  Difficult doing work/chores - Not difficult at all - - Not difficult at all    Review of Systems     Objective:   Physical Exam Not performed as patient was seen via phone.    Assessment & Plan:  Mr. Daughtridge  is a 56 year old man presenting today for f/u of left hemiplegia, right sided low back pain, and left sided foot pain following stroke, left shoulder pain.  1) Right sided low back pain: -discussed with patient and wife that Xrs show spinal degeneration -pain appears to be more myofascial -trigger point injections with lidocaine provided <1 week of benefit -Would like to try Botox injections for longer relief as this pain is greatly inhibiting patient's function and quality of life -continue heat and lidocaine patch and home physical and occupational therapy -Can take tylenol 500mg  three times per day -minimize use of Norco as much as possible as not benefiting and is a high risk opioid -can continue tizandine as needed.   2) left sided foot pain -stop Gabapentin  -start Cymbalta 20mg  daily, discussed side effects of nausea.  -recommended icing 15 minutes three times per day -hold off on therapy until neuropathy improves -can keep compression garment and boot off since these were worsening his pain -Will not repeat Qutenza since it did not did help foot pain -Turmeric to reduce inflammation--can be used in cooking or taken as a supplement.  Benefits of turmeric:  -Highly anti-inflammatory  -Increases antioxidants  -Improves memory, attention, brain disease  -Lowers risk of heart disease  -May help prevent cancer  -Decreases pain  -Alleviates depression  -Delays aging and decreases risk of chronic disease  -Consume with black pepper to increase absorption    3) CVA -provided with a handicap placard.   --progress to quad walker -face to face for AFO today  4) Left sided arm pain: -has felt numbness and weakness in his upper extremity.  -refilled tizanidine, lidocaine patch, and diclofenac gel -can consider repeating Qutenza as it did help with his shoulder pain.  -continue tylenol before sleep  5) Agitation: resolved, may discontinue seroquel.  6) Bilateral lower extremity edema -recommended avoiding added salt -prescribed lasix 20mg  daily prn  7) HTN 128/68 -commended on improvements in blood pressure! -Advised checking BP daily at home and logging results to bring into follow-up appointment with PCP and myself. -Reviewed BP meds today.  -Advised regarding healthy foods that can help lower blood pressure and provided with a list: 1) citrus foods- high in vitamins and minerals 2) salmon and other fatty fish - reduces inflammation and oxylipins 3) swiss chard (leafy green)- high level of nitrates 4) pumpkin seeds- one of the best natural sources of magnesium 5) Beans and lentils- high in fiber, magnesium, and potassium 6) Berries- high in flavonoids 7) Amaranth (whole grain, can be cooked similarly to rice and oats)- high in magnesium and fiber 8) Pistachios- even more effective at reducing BP than other nuts 9) Carrots- high in phenolic compounds that relax blood vessels and reduce inflammation 10) Celery- contain phthalides that relax tissues of arterial walls 11) Tomatoes- can also improve cholesterol and reduce risk of heart disease 12) Broccoli- good source of magnesium, calcium, and potassium 13) Greek yogurt: high in potassium and calcium 14) Herbs and spices: Celery seed, cilantro, saffron, lemongrass, black cumin, ginseng, cinnamon, cardamom, sweet basil, and ginger 15) Chia and flax seeds- also help to lower cholesterol and blood sugar 16) Beets- high levels of nitrates that relax blood vessels  17) spinach and bananas- high in potassium  -Provided lise of  supplements that can help with hypertension:  1) magnesium: one high quality brand is Bioptemizers since it contains all 7 types of magnesium, otherwise over the counter magnesium gluconate 400mg  is a good option  2) B vitamins 3) vitamin D 4) potassium 5) CoQ10 6) L-arginine 7) Vitamin C 8) Beetroot -Educated that goal BP is 120/80. -Made goal to incorporate some of the above foods into diet.    8) Obesity -commended on 38 pound weight loss! Discussed how this can help with his pain as well -current BMI is 30.71  9) Left shoulder pain -XR ordered and results discussed with patient: shows subluxation and glenohumeral arthritis. Discussed applying volataren gel or CBD oil, using heat pad, continuing therapy and avoiding movements that worsen the pain -will change next appointment to a steroid injection, discussed risks and benefits of steroid injection an will only perform if pain has failed to improve with above interventions. -Discussed current symptoms of pain and history of pain.  -Discussed benefits of exercise in reducing pain. -Discussed following foods that may reduce pain: 1) Ginger (especially studied for arthritis)- reduce leukotriene production to decrease inflammation 2) Blueberries- high in phytonutrients that decrease inflammation 3) Salmon- marine omega-3s reduce joint swelling and pain 4) Pumpkin seeds- reduce inflammation 5) dark chocolate- reduces inflammation 6) turmeric- reduces inflammation 7) tart cherries - reduce pain and stiffness 8) extra virgin olive oil - its compound olecanthal helps to block prostaglandins  9) chili peppers- can be eaten or applied topically via capsaicin 10) mint- helpful for headache, muscle aches, joint pain, and itching 11) garlic- reduces inflammation  Link to further information on diet for chronic pain: http://www.bray.com/   Turmeric to reduce  inflammation--can be used in cooking or taken as a supplement.  Benefits of turmeric:  -Highly anti-inflammatory  -Increases antioxidants  -Improves memory, attention, brain disease  -Lowers risk of heart disease  -May help prevent cancer  -Decreases pain  -Alleviates depression  -Delays aging and decreases risk of chronic disease  -Consume with black pepper to increase absorption    Turmeric Milk Recipe:  1 cup milk  1 tsp turmeric  1 tsp cinnamon  1 tsp grated ginger (optional)  Black pepper (boosts the anti-inflammatory properties of turmeric).  1 tsp honey   7 minutes spent in discussion of left shoulder pain, reviewing XR results with him, changing follow-up appointment to steroid injection

## 2021-03-24 NOTE — Therapy (Signed)
[facility] [address] [place], [place], [address] Phone: [phone]   Fax:  [phone]  Occupational Therapy Treatment  Patient Details  Name: [name] MRN: [id number] Date of Birth: Nov 14, 1965 Referring Provider (OT): Sula Soda, MD   Encounter Date: 03/24/2021   OT End of Session - 03/24/21 1451     Visit Number 10    Number of Visits 13    Date for OT Re-Evaluation 04/21/21   8 additional visits in 10 weeks d/t any scheduling conflicts - updated at recert 02/10/21   Authorization Type [employer]    Authorization Time Period 1st 5 visits are at no charge, VL: PT/OT combined 30    Authorization - Number of Visits 15    OT Start Time 1448    OT Stop Time 1530    OT Time Calculation (min) 42 min    Activity Tolerance Patient tolerated treatment well    Behavior During Therapy [facility] for tasks assessed/performed             Past Medical History:  Diagnosis Date   Anxiety    Cataract    removed left eye   Chronic combined systolic and diastolic CHF (congestive heart failure) (HCC)    EF 25-30%   Diastolic dysfunction    Dyslipidemia    Hyperlipidemia    Hypertension    Neuromuscular disorder (HCC)    neuropathy left foot   Nonischemic dilated cardiomyopathy (HCC)    felt secondary to HTN with no ischemia on nuclear stress test 05/2012, EF 25-30%   Seizures (HCC)    last seizure 05-2020- on Keppra   Shortness of breath    Stroke (HCC) 05/2020    Past Surgical History:  Procedure Laterality Date   COLONOSCOPY WITH PROPOFOL N/A 12/09/2020   Procedure: COLONOSCOPY WITH PROPOFOL;  Surgeon: Benancio Deeds, MD;  Location: [facility];  Service: Gastroenterology;  Laterality: N/A;   KNEE ARTHROSCOPY Left 04/13/2016   Procedure: ARTHROSCOPY KNEE WITH DEBRIDEMENT;  Surgeon: Valeria Batman, MD;  Location: [facility];  Service: Orthopedics;  Laterality: Left;   KNEE ARTHROSCOPY WITH  MEDIAL MENISECTOMY Right 04/13/2016   Procedure: KNEE ARTHROSCOPY WITH MEDIAL MENISECTOMY;  Surgeon: Valeria Batman, MD;  [facility];  Service: Orthopedics;  Laterality: LEFT not right knee   NASAL FRACTURE SURGERY     SHOULDER SURGERY Left     There were no vitals filed for this visit.   Discussion about subluxation as patient was diagnosed with subluxation via Xray. OT unable to palpate or identify subluxation via palpation today but instructed patient on exercises for assistance with LUE shoulder strengthening with bodyweight (shrugs, weight bearing, etc).    Worked on scap retraction and downward rotation d/t anterior humeral head in LUE and working on postural control and upright seated positioning. Dissociation of hip and trunk with trunk rotation bilaterally x 4  Use of HemiGlide with LUE for place and hold for maintaining position with shoulder at 75-90* and elbow extension and horizontal abduction with min difficulty                      OT Short Term Goals - 02/10/21 1117       OT SHORT TERM GOAL #1   Title Pt will be independent with HEP targeting LUE range of motion and coordination    Time 4    Period Weeks    Status On-going  pt req'd review 02/10/21   Target Date 01/05/21      OT SHORT TERM GOAL #2   Title Pt will increase box and blocks score with LUE to 10 blocks or greater for increasing functional use of LUE.    Baseline R 40, L 7    Time 4    Period Weeks    Status On-going   9 blocks LUE 02/10/21     OT SHORT TERM GOAL #3   Title Pt will verbalize understanding of sensory safety strategies for increasing independence and safety    Time 4    Period Weeks    Status Achieved      OT SHORT TERM GOAL #4   Title Pt will perform simple warm meal prep and/or light housekeeping with supervision and incresed safety awareness d/t sensory deficits.    Time 4    Period Weeks    Status Achieved   pt reports doing his own  laundry 02/10/21              OT Long Term Goals - 02/24/21 1451       OT LONG TERM GOAL #1   Title Pt will be independent with any updated HEPs    Time 8    Period Weeks    Status New    Target Date 04/21/21      OT LONG TERM GOAL #2   Title Pt will perform bathing with mod I and good safety awareness consistently, per report.    Time 8    Period Weeks    Status Achieved   pt reports doing this at his son in laws and requiring asssistance for getting in and out of shower at home - 02/24/21     OT LONG TERM GOAL #3   Title Pt will assist with at least one home management and/or meal prep task per day in order to increase independence and decrease caregiver burden.    Time 8    Period Weeks    Status Achieved   pt reports consistently assistance with cooking with air fryer and laundry, etc     OT LONG TERM GOAL #4   Title Pt will obtain a lightweight object from a low to mid level surface with LUE in order to increase ability to use LUE for functional reaching and use.    Time 8    Period Weeks    Status On-going                   Plan - 03/24/21 1452     Clinical Impression Statement Pt received X ray and impression is LUE subluxation. Pt now seeking out MRI.    OT Occupational Profile and History Problem Focused Assessment - Including review of records relating to presenting problem    Occupational performance deficits (Please refer to evaluation for details): ADL's;IADL's;Leisure    Body Structure / Function / Physical Skills ADL;GMC;FMC;Flexibility;Coordination;Sensation;IADL;ROM;UE functional use;Proprioception;Pain;Strength;Decreased knowledge of use of DME    Rehab Potential Good    Clinical Decision Making Limited treatment options, no task modification necessary    Comorbidities Affecting Occupational Performance: None    Modification or Assistance to Complete Evaluation  No modification of tasks or assist necessary to complete eval    OT Frequency 1x /  week    OT Duration Other (comment)   8 visits over 10 weeks d/t any scheduling conflicts at recert - 02/10/21   OT Treatment/Interventions Self-care/ADL training;Aquatic Therapy;Patient/family education;Manual Therapy;Electrical Stimulation;Neuromuscular  education;Functional Mobility Training;Passive range of motion;Visual/perceptual remediation/compensation;Therapeutic exercise;Ultrasound;Moist Heat;Fluidtherapy;DME and/or AE instruction;Therapeutic activities;Splinting    Plan reaching patterns, grasp and release with LUE    Consulted and Agree with Plan of Care Patient;Family member/caregiver    Family Member Consulted spouse             Patient will benefit from skilled therapeutic intervention in order to improve the following deficits and impairments:   Body Structure / Function / Physical Skills: ADL, GMC, FMC, Flexibility, Coordination, Sensation, IADL, ROM, UE functional use, Proprioception, Pain, Strength, Decreased knowledge of use of DME       Visit Diagnosis: 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-dominant side (HCC)  Other lack of coordination  Muscle weakness (generalized)  Chronic left shoulder pain    Problem List Patient Active Problem List   Diagnosis Date Noted   Colon cancer screening    Left hemiplegia (HCC) 07/02/2020   Cognitive deficit due to recent stroke 07/02/2020   Dysphagia, post-stroke    Vascular headache    Acute blood loss anemia    AKI (acute kidney injury) (HCC)    Essential hypertension    Seizures (HCC)    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   ICH (intracerebral hemorrhage) (HCC) 06/09/2020   Sprain of anterior talofibular ligament of left ankle 01/15/2020   Hyperlipidemia, on Lipitor 04/01/2017   Alcohol use disorder, mild, abuse 06/30/2016   Morbid obesity (HCC) 06/30/2016   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   Chronic combined systolic and diastolic CHF (congestive heart  failure) (HCC) 11/08/2012   HTN (hypertension) 05/25/2012    Junious Dresser, OT 03/24/2021, 3:30 PM  [facility] [address] [place], [place], [address] Phone: [phone]   Fax:  [phone]  Name: [name] MRN: [id number] Date of Birth: March 03, 1965

## 2021-03-25 ENCOUNTER — Encounter: Payer: Self-pay | Admitting: Physical Therapy

## 2021-03-25 NOTE — Therapy (Signed)
[facility] [address] [place], [place], [address] Phone: [phone]   Fax:  [phone]  Physical Therapy Treatment  Patient Details  Name: [name] MRN: [id number] Date of Birth: Jul 22, 1965 Referring Provider (PT): Sula Soda, MD   Encounter Date: 03/24/2021   PT End of Session - 03/25/21 1111     Visit Number 11    Number of Visits 15   1st 5 visits are at no charge; 30 PT/OT combined   Date for PT Re-Evaluation 04/08/21    Authorization Type [employer]. 30 visits PT/OT combined, each dicipline counts as 1 visit    Authorization - Visit Number 10   PT and OT visits since 01-23-21   Authorization - Number of Visits 30    PT Start Time 1405    PT Stop Time 1446    PT Time Calculation (min) 41 min    Equipment Utilized During Treatment Gait belt    Activity Tolerance Patient tolerated treatment well    Behavior During Therapy WFL for tasks assessed/performed             Past Medical History:  Diagnosis Date   Anxiety    Cataract    removed left eye   Chronic combined systolic and diastolic CHF (congestive heart failure) (HCC)    EF 25-30%   Diastolic dysfunction    Dyslipidemia    Hyperlipidemia    Hypertension    Neuromuscular disorder (HCC)    neuropathy left foot   Nonischemic dilated cardiomyopathy (HCC)    felt secondary to HTN with no ischemia on nuclear stress test 05/2012, EF 25-30%   Seizures (HCC)    last seizure 05-2020- on Keppra   Shortness of breath    Stroke (HCC) 05/2020    Past Surgical History:  Procedure Laterality Date   COLONOSCOPY WITH PROPOFOL N/A 12/09/2020   Procedure: COLONOSCOPY WITH PROPOFOL;  Surgeon: Benancio Deeds, MD;  Location: [facility];  Service: Gastroenterology;  Laterality: N/A;   KNEE ARTHROSCOPY Left 04/13/2016   Procedure: ARTHROSCOPY KNEE WITH DEBRIDEMENT;  Surgeon: Valeria Batman, MD;  Location: [facility];  Service:  Orthopedics;  Laterality: Left;   KNEE ARTHROSCOPY WITH MEDIAL MENISECTOMY Right 04/13/2016   Procedure: KNEE ARTHROSCOPY WITH MEDIAL MENISECTOMY;  Surgeon: Valeria Batman, MD;  Location: [facility];  Service: Orthopedics;  Laterality: LEFT not right knee   NASAL FRACTURE SURGERY     SHOULDER SURGERY Left     There were no vitals filed for this visit.   Subjective Assessment - 03/25/21 1101     Subjective Pt reports he saw Dr. Carlis Abbott on Monday and then saw neurology yesterday (on Wed.); reports neurologist said shoulder was subluxed - is ordering a MRI to see if any tissues are damaged.  Pt reports he feels his left shoulder is lower than it has been.  Reports Dr. Carlis Abbott mentioned AFO in her note for the face to face visit to be documented    Pertinent History HTN, cardiomyopathy, chronic combined systolic & diastolic CHF, alcohol use disorder, seizures    Currently in Pain? No/denies    Pain Onset More than a month ago                               [facility] Adult PT Treatment/Exercise - 03/25/21 0001       Transfers   Transfers Sit to Stand;Stand to  Sit    Sit to Stand 4: Min guard    Stand to Sit 5: Supervision    Number of Reps Other reps (comment)   5 reps   Comments Rt foot on balance bubble for increased LLE weight bearing and strengthening      Ambulation/Gait   Ambulation/Gait Yes    Ambulation/Gait Assistance 4: Min guard   CGA to min assist x 2 occurrences due to Lt foot sticking to floor   Ambulation/Gait Assistance Details [place] Walk on AFO was donned on LLE    Ambulation Distance (Feet) 350 Feet   3 laps around track in clinic gym   Assistive device Small based quad cane   100' without use of cane   Gait Pattern Decreased step length - left;Decreased arm swing - left;Decreased stance time - left;Decreased hip/knee flexion - left;Decreased dorsiflexion - left;Decreased weight shift to left    Ambulation Surface Level;Indoor       Knee/Hip Exercises: Machines for Strengthening   Cybex Leg Press bil. LE's 70# 10 reps:  LLE only 40# 2 sets 10 reps                       PT Short Term Goals - 03/25/21 1120       PT SHORT TERM GOAL #1   Title Pt will improve Berg balance test score by at least 4 points to demo improvement in balance.  Increase Berg score to >/= 49/56 (Target date for all STG's 03-11-21)    Baseline 01/06/21: initial score set this date is 40/56;  46/56 on 02-10-21    Time 4    Period Weeks    Status New    Target Date 03/11/21      PT SHORT TERM GOAL #2   Title Improve TUG score from 38.87 secs to </= 33 secs with use of hemiwalker to demo improvement in functional mobility.; Upgraded STG - TUG score </= 26 secs with hemiwalker    Baseline 01/06/21: unable to reassess due to time contraints, 38.87 sec's at baseline;  29.53 secs on 02-10-21 with hemiwalker    Time 4    Period Weeks    Status New    Target Date 03/11/21      PT SHORT TERM GOAL #3   Title Amb. with SBQC 230' with CGA on flat, even surface for increased community accessibility.;  Amb. 460' with SBQC with SBA    Baseline 350' with SBQC on 02-10-21    Time 4    Period Weeks    Status New    Target Date 03/11/21      PT SHORT TERM GOAL #4   Title Assess foot up brace vs. AFO for LLE for safety with amb.    Time 4    Period Weeks    Status New    Target Date 03/11/21               PT Long Term Goals - 03/25/21 1120       PT LONG TERM GOAL #1   Title Improve Berg balance test score by at least 8 points for reduced fall risk.  Increase Berg score to >/= 52/56 for reduced fall risk.    Baseline improved 6 points from 40/56 on 01-06-21 to 46/56 on 02-10-21    Time 8    Period Weeks    Status New    Target Date 04/08/21      PT LONG TERM GOAL #  2   Title Increase gait velocity from .68 ft/sec to >/= 1.4 ft/sec with hemiwalker for increased gait efficiency.  Upgraded LTG - increase gait speed to >/= 1.6 ft/sec  with [facility]    Baseline 47.78 secs = .68 ft/sec;     02-10-21:   26.78 secs = 1.22 ft/sec with hemiwalker    Time 8    Period Weeks    Status New    Target Date 04/08/21      PT LONG TERM GOAL #3   Title Improve TUG score to </= 30 secs with hemiwalker to reduce fall risk and to demo incr. functional mobility. Upgraded LTG - score </=24 secs with SBQC    Baseline 38.87 secs with hemiwalker;   02-10-21--  29.53 secs with hemiwalker    Time 8    Period Weeks    Status New    Target Date 04/08/21      PT LONG TERM GOAL #4   Title Amb. 30' without use of assistive device with SBA for household amb. Upgraded goal - modified independent household amb. without device    Baseline hemiwalker - 72' with SBA to CGA;  30' with CGA as Lt foot does not clear floor occasionally    Time 8    Period Weeks    Status New    Target Date 04/08/21      PT LONG TERM GOAL #5   Title Amb. 500' with SPC with SBA for incr. community accessibility. Upgraded LTG - amb. 550' with SBQC on flat, even surface with SBA    Baseline 350' with SBQC on 02-10-21    Time 8    Period Weeks    Status New    Target Date 04/08/21      PT LONG TERM GOAL #6   Title Perform floor to stand transfer with UE support on object with SBA.    Time 8    Period Weeks    Status On-going    Target Date 04/08/21                   Plan - 03/24/21 1411     Clinical Impression Statement PT session focused on gait training with Lt AFO and on LLE strengthening.  Pt reported some residual weakness/decreased activity tolerance due to episode occurring last week, in which he felt weak, had blurry vision and thought he may be having a seizure.  Pt reports he saw Ihor Austin, NP at [facility] on 03-24-21 and seizure was ruled out, however, reports she ordered a MRI on Lt shoulder due to subluxation.  AFO on LLE was able to be donned today as decreased edema present in LLE compared to that at time of previous PT session 2 weeks ago.  Pt reported  LLE felt better at end of session after performing strengthening and gait training exercises.  Cont with POC.    Personal Factors and Comorbidities Behavior Pattern;Comorbidity 2;Profession;Transportation    Comorbidities cardiomyopathy, systolic & diastolic CHF, ICH, seizures    Examination-Activity Limitations Bend;Carry;Lift;Stand;Stairs;Squat;Locomotion Level;Transfers;Sleep;Bed Mobility    Examination-Participation Restrictions Cleaning;Community Activity;Driving;Shop;Laundry;Occupation;Meal Prep;Yard Work    Conservation officer, historic buildings Evolving/Moderate complexity    Rehab Potential Good    PT Frequency 2x / week    PT Duration 8 weeks    PT Treatment/Interventions ADLs/Self Care Home Management;Aquatic Therapy;DME Instruction;Gait training;Stair training;Therapeutic activities;Therapeutic exercise;Balance training;Orthotic Fit/Training;Patient/family education;Neuromuscular re-education    PT Next Visit Plan added additional ex's to HEP; continue to work on strengthening and  balance. trial AFO on left LE    PT Home Exercise Plan Access Code: [email or web address]    Consulted and Agree with Plan of Care Patient;Family member/caregiver    Family Member Consulted wife [name]             Patient will benefit from skilled therapeutic intervention in order to improve the following deficits and impairments:  Abnormal gait, Decreased activity tolerance, Decreased balance, Decreased coordination, Decreased strength, Impaired tone, Impaired sensation, Impaired UE functional use, Impaired vision/preception, Pain  Visit Diagnosis: 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-dominant side (HCC)  Muscle weakness (generalized)  Other abnormalities of gait and mobility  Unsteadiness on feet     Problem List Patient Active Problem List   Diagnosis Date Noted   Colon cancer screening    Left hemiplegia (HCC) 07/02/2020   Cognitive deficit due to recent stroke  07/02/2020   Dysphagia, post-stroke    Vascular headache    Acute blood loss anemia    AKI (acute kidney injury) (HCC)    Essential hypertension    Seizures (HCC)    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   ICH (intracerebral hemorrhage) (HCC) 06/09/2020   Sprain of anterior talofibular ligament of left ankle 01/15/2020   Hyperlipidemia, on Lipitor 04/01/2017   Alcohol use disorder, mild, abuse 06/30/2016   Morbid obesity (HCC) 06/30/2016   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   Chronic combined systolic and diastolic CHF (congestive heart failure) (HCC) 11/08/2012   HTN (hypertension) 05/25/2012    Elgie Maziarz, Donavan Burnet, PT 03/25/2021, 11:21 AM  [facility] [address] [place], [place], [address] Phone: [phone]   Fax:  [phone]  Name: [name] MRN: [id number] Date of Birth: [birth year]

## 2021-03-27 DIAGNOSIS — R569 Unspecified convulsions: Secondary | ICD-10-CM | POA: Diagnosis not present

## 2021-03-27 DIAGNOSIS — I610 Nontraumatic intracerebral hemorrhage in hemisphere, subcortical: Secondary | ICD-10-CM | POA: Diagnosis not present

## 2021-03-27 DIAGNOSIS — I5042 Chronic combined systolic (congestive) and diastolic (congestive) heart failure: Secondary | ICD-10-CM | POA: Diagnosis not present

## 2021-03-29 ENCOUNTER — Other Ambulatory Visit: Payer: Self-pay

## 2021-03-29 ENCOUNTER — Encounter (HOSPITAL_BASED_OUTPATIENT_CLINIC_OR_DEPARTMENT_OTHER): Payer: BC Managed Care – PPO | Admitting: Physical Medicine and Rehabilitation

## 2021-03-29 ENCOUNTER — Telehealth: Payer: Self-pay | Admitting: Adult Health

## 2021-03-29 DIAGNOSIS — G8194 Hemiplegia, unspecified affecting left nondominant side: Secondary | ICD-10-CM | POA: Diagnosis not present

## 2021-03-29 DIAGNOSIS — R569 Unspecified convulsions: Secondary | ICD-10-CM | POA: Diagnosis not present

## 2021-03-29 DIAGNOSIS — I5042 Chronic combined systolic (congestive) and diastolic (congestive) heart failure: Secondary | ICD-10-CM | POA: Diagnosis not present

## 2021-03-29 DIAGNOSIS — I610 Nontraumatic intracerebral hemorrhage in hemisphere, subcortical: Secondary | ICD-10-CM | POA: Diagnosis not present

## 2021-03-29 NOTE — Telephone Encounter (Signed)
MR Brain w/wo contrast Dalton Austin, NP [place] Numbers: [id number] (exp. 03/25/21 to 04/23/21). Patient is scheduled at [facility] for 04/06/21. ?

## 2021-03-29 NOTE — Addendum Note (Signed)
Addended by: Horton Chin on: 03/29/2021 01:39 PM ? ? Modules accepted: Level of Service ? ?

## 2021-03-29 NOTE — Progress Notes (Addendum)
Subjective:    Patient ID: [name], male    DOB: October 20, 1965, 56 y.o.   MRN: [id number]  HPI An audio/video tele-health visit is felt to be the most appropriate encounter for this patient at this time. This is a follow up tele-visit via phone. The patient is at home. MD is at office. Prior to scheduling this appointment, our staff discussed the limitations of evaluation and management by telemedicine and the availability of in-person appointments. The patient expressed understanding and agreed to proceed.   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  1) CVA -starting water therapy soon'-incredible improvement in ambulation! -pain has been severe at home, as it had been in hospital -his wife purchased  tablets of tylenol -he is worried about his decreased upper extremity strength and asks if this will ever improve -he is not experiencing much shoulder pain right now -he does have some strength -the benefits from the trigger point injections lwore off within 1 week -pain is severely limiting his function -he is taking Tizanidine and it does not help, does make him sleepy, but his wife is trying to encourage him to stay up during the day -he developed the foot pain a couple of weeks ago -he was wearing a boot at that time so took it off but has still had pain a couple of weeks afterward. -he has not tried anything for this pain -it is worst at night and feels like burning and tingling at his heel. -he has been doing great with therapy and is walking in his home! -his wife asks if he could get a handicap placard.  -the foot is still feeling numb with pins and needles, also painful. He is unsure whether Gabapentin helps- so far nothing he as tried has helped his neuropathy. His balance is also now off and he has been stumbling more. He actually felt that the Gabapentin  made his pain worse. He has never tried Cymbalta but is willing to try it.  -Has been able to use bathroom himself! -he is working with therapy right now.  -he was feeling wobbly over the weekend so had to miss his PT session -shoulder is feeling very numb and cannot make a fist -ready to proceed with Qutenza today -His wife asks for refill of Eucerin and Diclofenac and Tizanidine.  -She asks whether he should still be taking Seroquel -he called medical records and records were faxed on December 28th.  -He called our office today  -He needs medical records to process his disability claims -able to walk much better.   2) Lower extremity edema -worsening -not painful -he asks when his next appointment with me is.  -has gotten used to it -therapist was concerned -painful -sometimes feels cold on the left foot -never took the fluid pills  3) Headaches -started last week and felt loss of balance -this stopped him from doing therapy.  -still having tension in head.  -he takes topamax  HS.   4) Obesity -he has lost 36 lbs!  5) Left shoulder pain -improved   Family History  Problem Relation Age of Onset   Hypertension Mother        Passed away when she is 26 secondary to hip surgery complications   CAD Mother    Heart attack Mother    Hypertension Father    Diabetes Brother    Stroke Brother  Heart attack Maternal Grandmother    Colon cancer Neg Hx    Esophageal cancer Neg Hx    Pancreatic cancer Neg Hx    Stomach cancer Neg Hx    Colon polyps Neg Hx    Social History   Socioeconomic History   Marital status: Married    Spouse name: Not on file   Number of children: Not on file   Years of education: Not on file   Highest education level: Not on file  Occupational History   Not on file  Tobacco Use   Smoking status: Never   Smokeless tobacco: Never  Vaping Use   Vaping Use: Never used  Substance and Sexual Activity   Alcohol use: Not Currently   Drug  use: No   Sexual activity: Yes  Other Topics Concern   Not on file  Social History Narrative   Not on file   Social Determinants of Health   Financial Resource Strain: Not on file  Food Insecurity: Not on file  Transportation Needs: Not on file  Physical Activity: Not on file  Stress: Not on file  Social Connections: Not on file   Past Surgical History:  Procedure Laterality Date   COLONOSCOPY WITH PROPOFOL N/A 12/09/2020   Procedure: COLONOSCOPY WITH PROPOFOL;  Surgeon: Benancio Deeds, MD;  Location: [facility];  Service: Gastroenterology;  Laterality: N/A;   KNEE ARTHROSCOPY Left 04/13/2016   Procedure: ARTHROSCOPY KNEE WITH DEBRIDEMENT;  Surgeon: Valeria Batman, MD;  Location: [facility];  Service: Orthopedics;  Laterality: Left;   KNEE ARTHROSCOPY WITH MEDIAL MENISECTOMY Right 04/13/2016   Procedure: KNEE ARTHROSCOPY WITH MEDIAL MENISECTOMY;  Surgeon: Valeria Batman, MD;  Location: [facility];  Service: Orthopedics;  Laterality: LEFT not right knee   NASAL FRACTURE SURGERY     SHOULDER SURGERY Left    Past Medical History:  Diagnosis Date   Anxiety    Cataract    removed left eye   Chronic combined systolic and diastolic CHF (congestive heart failure) (HCC)    EF 25-30%   Diastolic dysfunction    Dyslipidemia    Hyperlipidemia    Hypertension    Neuromuscular disorder (HCC)    neuropathy left foot   Nonischemic dilated cardiomyopathy (HCC)    felt secondary to HTN with no ischemia on nuclear stress test 05/2012, EF 25-30%   Seizures (HCC)    last seizure 05-2020- on Keppra   Shortness of breath    Stroke (HCC) 05/2020   There were no vitals taken for this visit.  Opioid Risk Score:   Fall Risk Score:  `1  Depression screen PHQ 2/9  Depression screen [facility] 2/9 03/21/2021 09/22/2020 09/14/2020 09/14/2020 07/30/2020  Decreased Interest 0 0 0 0 0  Down, Depressed, Hopeless 0 0 0 0 0  PHQ - 2 Score 0 0 0 0 0  Altered sleeping  - - 0 - 0  Tired, decreased energy - - 0 - 0  Change in appetite - - 0 - 0  Feeling bad or failure about yourself  - - 0 - 0  Trouble concentrating - - 0 - 0  Moving slowly or fidgety/restless - - 0 - 0  Suicidal thoughts - - 0 - 0  PHQ-9 Score - - 0 - 0  Difficult doing work/chores - Not difficult at all - - Not difficult at all    Review of Systems     Objective:   Physical Exam Not performed as  patient was seen via phone.    Assessment & Plan:  [name] is a 56 year old man presenting today for follow-up of left hemiplegia, right sided low back pain, and left sided foot pain following stroke, left shoulder pain.  1) Right sided low back pain: -discussed with patient and wife that Xrs show spinal degeneration -pain appears to be more myofascial -trigger point injections with lidocaine provided <1 week of benefit -Would like to try Botox injections for longer relief as this pain is greatly inhibiting patient's function and quality of life -continue heat and lidocaine patch and home physical and occupational therapy -Can take tylenol 500mg  three times per day -minimize use of Norco as much as possible as not benefiting and is a high risk opioid -can continue tizandine as needed.   2) left sided foot pain -stop Gabapentin  -he is purchasing a stimulator that he plans to use on his arm and foot.  -start Cymbalta 20mg  daily, discussed side effects of nausea.  -recommended icing 15 minutes three times per day -hold off on therapy until neuropathy improves -can keep compression garment and boot off since these were worsening his pain -Will not repeat Qutenza since it did not did help foot pain -Turmeric to reduce inflammation--can be used in cooking or taken as a supplement.  Benefits of turmeric:  -Highly anti-inflammatory  -Increases antioxidants  -Improves memory, attention, brain disease  -Lowers risk of heart disease  -May help prevent cancer  -Decreases  pain  -Alleviates depression  -Delays aging and decreases risk of chronic disease  -Consume with black pepper to increase absorption    3) Right thalamic hemorrhage -provided with a handicap placard.  --progress to quad walker -face to face for AFO today -discussed that most motor improvement occurs in the first 4 weeks, but improvement is still seen in the first 6 months, and some improvement may still be seen in the first 2 weeks -evaluated for vagal nerve stimulation trial for unfortunately will not qualify due to his stroke being hemorrhagic -will send him information about deep brain stimulation, discussed with him that I have seen this help one of my patients  4) Left sided arm pain: -has felt numbness and weakness in his upper extremity.  -refilled tizanidine, lidocaine patch, and diclofenac gel -can consider repeating Qutenza as it did help with his shoulder pain.  -continue tylenol before sleep -discussed only doing steroid injection if he is having pain, as pain is currently controlled with muscle rub  5) Agitation: resolved, may discontinue seroquel.  6) Bilateral lower extremity edema -recommended avoiding added salt -prescribed lasix 20mg  daily prn  7) HTN 128/68 -commended on improvements in blood pressure! -Advised checking BP daily at home and logging results to bring into follow-up appointment with PCP and myself. -Reviewed BP meds today.  -Advised regarding healthy foods that can help lower blood pressure and provided with a list: 1) citrus foods- high in vitamins and minerals 2) salmon and other fatty fish - reduces inflammation and oxylipins 3) swiss chard (leafy green)- high level of nitrates 4) pumpkin seeds- one of the best natural sources of magnesium 5) Beans and lentils- high in fiber, magnesium, and potassium 6) Berries- high in flavonoids 7) Amaranth (whole grain, can be cooked similarly to rice and oats)- high in magnesium and fiber 8)  Pistachios- even more effective at reducing BP than other nuts 9) Carrots- high in phenolic compounds that relax blood vessels and reduce inflammation 10) Celery- contain phthalides that  relax tissues of arterial walls 11) Tomatoes- can also improve cholesterol and reduce risk of heart disease 12) Broccoli- good source of magnesium, calcium, and potassium 13) Greek yogurt: high in potassium and calcium 14) Herbs and spices: Celery seed, cilantro, saffron, lemongrass, black cumin, ginseng, cinnamon, cardamom, sweet basil, and ginger 15) Chia and flax seeds- also help to lower cholesterol and blood sugar 16) Beets- high levels of nitrates that relax blood vessels  17) spinach and bananas- high in potassium  -Provided lise of supplements that can help with hypertension:  1) magnesium: one high quality brand is Bioptemizers since it contains all 7 types of magnesium, otherwise over the counter magnesium gluconate 400mg  is a good option 2) B vitamins 3) vitamin D 4) potassium 5) CoQ10 6) L-arginine 7) Vitamin C 8) Beetroot -Educated that goal BP is 120/80. -Made goal to incorporate some of the above foods into diet.    8) Obesity -commended on 38 pound weight loss! Discussed how this can help with his pain as well -current BMI is 30.71  9) Left shoulder pain -XR ordered and results discussed with patient: shows subluxation and glenohumeral arthritis. Discussed applying volataren gel or CBD oil, using heat pad, continuing therapy and avoiding movements that worsen the pain  -plan for MRI of shoulder.   -Discussed Sprint PNS system as an option of pain treatment via neuromodulation. Provided following link for patient to learn more about the system: https://www.sprtherapeutics.com/. Explained mechanism of activating A beta fibers which function in touch, pressure, and vibration, to inhibit the sensations of A delta and C fibers which are responsible for pain transmission.    -will change  next appointment to a steroid injection, discussed risks and benefits of steroid injection an will only perform if pain has failed to improve with above interventions.  -Discussed current symptoms of pain and history of pain.  -Discussed benefits of exercise in reducing pain. -Discussed following foods that may reduce pain: 1) Ginger (especially studied for arthritis)- reduce leukotriene production to decrease inflammation 2) Blueberries- high in phytonutrients that decrease inflammation 3) Salmon- marine omega-3s reduce joint swelling and pain 4) Pumpkin seeds- reduce inflammation 5) dark chocolate- reduces inflammation 6) turmeric- reduces inflammation 7) tart cherries - reduce pain and stiffness 8) extra virgin olive oil - its compound olecanthal helps to block prostaglandins  9) chili peppers- can be eaten or applied topically via capsaicin 10) mint- helpful for headache, muscle aches, joint pain, and itching 11) garlic- reduces inflammation  Link to further information on diet for chronic pain:   Turmeric to reduce inflammation--can be used in cooking or taken as a supplement.  Benefits of turmeric:  -Highly anti-inflammatory  -Increases antioxidants  -Improves memory, attention, brain disease  -Lowers risk of heart disease  -May help prevent cancer  -Decreases pain  -Alleviates depression  -Delays aging and decreases risk of chronic disease  -Consume with black pepper to increase absorption    Turmeric Milk Recipe:  1 cup milk  1 tsp turmeric  1 tsp cinnamon  1 tsp grated ginger (optional)  Black pepper (boosts the anti-inflammatory properties of turmeric).  1 tsp honey   10 minutes spent in discussing his left upper extremity weakness, prognosis for recovery, evaluating as candidate for vagal nerve stimulation study but unfortunately will not qualify due to  hemorrhagic stroke

## 2021-03-31 ENCOUNTER — Ambulatory Visit: Payer: BC Managed Care – PPO | Admitting: Physical Therapy

## 2021-03-31 ENCOUNTER — Encounter: Payer: Self-pay | Admitting: Occupational Therapy

## 2021-03-31 ENCOUNTER — Ambulatory Visit: Payer: BC Managed Care – PPO | Admitting: Occupational Therapy

## 2021-03-31 ENCOUNTER — Other Ambulatory Visit: Payer: Self-pay

## 2021-03-31 DIAGNOSIS — I69354 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-dominant side: Secondary | ICD-10-CM | POA: Diagnosis not present

## 2021-03-31 DIAGNOSIS — M25512 Pain in left shoulder: Secondary | ICD-10-CM | POA: Diagnosis not present

## 2021-03-31 DIAGNOSIS — M6281 Muscle weakness (generalized): Secondary | ICD-10-CM

## 2021-03-31 DIAGNOSIS — R2689 Other abnormalities of gait and mobility: Secondary | ICD-10-CM | POA: Diagnosis not present

## 2021-03-31 DIAGNOSIS — R278 Other lack of coordination: Secondary | ICD-10-CM | POA: Diagnosis not present

## 2021-03-31 DIAGNOSIS — R41842 Visuospatial deficit: Secondary | ICD-10-CM | POA: Diagnosis not present

## 2021-03-31 DIAGNOSIS — I69154 Hemiplegia and hemiparesis following nontraumatic intracerebral hemorrhage affecting left non-dominant side: Secondary | ICD-10-CM | POA: Diagnosis not present

## 2021-03-31 DIAGNOSIS — G8929 Other chronic pain: Secondary | ICD-10-CM | POA: Diagnosis not present

## 2021-03-31 DIAGNOSIS — R2681 Unsteadiness on feet: Secondary | ICD-10-CM

## 2021-03-31 NOTE — Therapy (Signed)
[facility] [address] [place], [place], [address] Phone: [phone]   Fax:  [phone]  Occupational Therapy Treatment & Recertification  Patient Details  Name: [name] MRN: [id number] Date of Birth: 10/05/1965 Referring Provider (OT): Leeroy Cha, MD   Encounter Date: 03/31/2021   OT End of Session - 03/31/21 1447     Visit Number 11    Number of Visits 20    Date for OT Re-Evaluation 05/26/21   8 additional visits in 10 weeks d/t any scheduling conflicts - updated at recert 8/41/32   Authorization Type [employer]    Authorization Time Period 1st 5 visits are at no charge, VL: PT/OT combined 30    Authorization - Number of Visits --    OT Start Time 1447    OT Stop Time 80    OT Time Calculation (min) 43 min    Activity Tolerance Patient tolerated treatment well    Behavior During Therapy [facility] for tasks assessed/performed             Past Medical History:  Diagnosis Date   Anxiety    Cataract    removed left eye   Chronic combined systolic and diastolic CHF (congestive heart failure) (HCC)    EF 44-01%   Diastolic dysfunction    Dyslipidemia    Hyperlipidemia    Hypertension    Neuromuscular disorder ([place])    neuropathy left foot   Nonischemic dilated cardiomyopathy ([place])    felt secondary to HTN with no ischemia on nuclear stress test 05/2012, EF 25-30%   Seizures ([place])    last seizure 05-2020- on Keppra   Shortness of breath    Stroke ([place]) 05/2020    Past Surgical History:  Procedure Laterality Date   COLONOSCOPY WITH PROPOFOL N/A 12/09/2020   Procedure: COLONOSCOPY WITH PROPOFOL;  Surgeon: Yetta Flock, MD;  Location: [facility];  Service: Gastroenterology;  Laterality: N/A;   KNEE ARTHROSCOPY Left 04/13/2016   Procedure: ARTHROSCOPY KNEE WITH DEBRIDEMENT;  Surgeon: Garald Balding, MD;  Location: [facility];  Service: Orthopedics;  Laterality: Left;   KNEE  ARTHROSCOPY WITH MEDIAL MENISECTOMY Right 04/13/2016   Procedure: KNEE ARTHROSCOPY WITH MEDIAL MENISECTOMY;  Surgeon: Garald Balding, MD;  Location: [facility];  Service: Orthopedics;  Laterality: LEFT not right knee   NASAL FRACTURE SURGERY     SHOULDER SURGERY Left     There were no vitals filed for this visit.   Subjective Assessment - 03/31/21 1448     Subjective  Pt with fatigue post PT. Pt denies any pain.    Pertinent History hypertension, obesity, cardiomyopathy, hyperlipidemia, CHF    Limitations Fall Risk. Sensory Deficits LUE    Patient Stated Goals driving, cooking, walking, bathing himself    Currently in Pain? No/denies    Pain Score 0-No pain             Kinesiotape  to LUE shoulder for minimal anterior subluxation. Pt verbalized understanding of wear and care.  Physioball forward reaching  x 10 reps with min verbal and tactile cues for downward scap retraction during exercise  Assessed Box and Blocks                     OT Short Term Goals - 03/31/21 1509       OT SHORT TERM GOAL #1   Title Pt will be independent with HEP targeting LUE range of motion and  coordination    Time 4    Period Weeks    Status Achieved   pt req'd review 02/10/21   Target Date 01/05/21      OT SHORT TERM GOAL #2   Title Pt will increase box and blocks score with LUE to 10 blocks or greater for increasing functional use of LUE.    Baseline R 40, L 7    Time 4    Period Weeks    Status Achieved   14 blocks on 03/31/21     OT SHORT TERM GOAL #3   Title Pt will verbalize understanding of sensory safety strategies for increasing independence and safety    Time 4    Period Weeks    Status Achieved      OT SHORT TERM GOAL #4   Title Pt will perform simple warm meal prep and/or light housekeeping with supervision and incresed safety awareness d/t sensory deficits.    Time 4    Period Weeks    Status Achieved   pt reports doing his own laundry  02/10/21              OT Long Term Goals - 03/31/21 1514       OT [place] #1   Title Pt will be independent with any updated HEPs    Time 8    Period Weeks    Status Achieved    Target Date 04/21/21      OT LONG TERM GOAL #2   Title Pt will perform bathing with mod I and good safety awareness consistently, per report.    Time 8    Period Weeks    Status Achieved   pt reports doing this at his son in laws and requiring asssistance for getting in and out of shower at home - 02/24/21     OT LONG TERM GOAL #3   Title Pt will assist with at least one home management and/or meal prep task per day in order to increase independence and decrease caregiver burden.    Time 8    Period Weeks    Status Achieved   pt reports consistently assistance with cooking with air fryer and laundry, etc     OT LONG TERM GOAL #4   Title Pt will obtain a lightweight object from a low to mid level surface with LUE in order to increase ability to use LUE for functional reaching and use.    Time 8    Period Weeks    Status On-going   pt completing but with considerable amount of compensatory movement patterns     OT LONG TERM GOAL #5   Title Pt will be independent with updated HEP for LUE normal movement patterns and NMR for low reaching    Time 8    Period Weeks    Status New    Target Date 05/26/21      Long Term Additional Goals   Additional Long Term Goals Yes      OT LONG TERM GOAL #6   Title Pt will increase functional use of LUE by completing box and blocks with score of 18 blocks or greater.    Baseline 14 blocks LUE    Time 8    Period Weeks    Status New                   Plan - 03/31/21 1533     Clinical Impression Statement Recertification for period  12/07/20 - 03/31/21. Pt has met all STGs and is progressing towards old and new LTGs. Pt has made improvements with LUE NMR and functional use. Skilled occupational therapy is recommended to target listed areas of  deficit and increase independence and functional use of LUE    OT Occupational Profile and History Problem Focused Assessment - Including review of records relating to presenting problem    Occupational performance deficits (Please refer to evaluation for details): ADL's;IADL's;Leisure    Body Structure / Function / Physical Skills ADL;GMC;FMC;Flexibility;Coordination;Sensation;IADL;ROM;UE functional use;Proprioception;Pain;Strength;Decreased knowledge of use of DME    Rehab Potential Good    Clinical Decision Making Limited treatment options, no task modification necessary    Comorbidities Affecting Occupational Performance: None    Modification or Assistance to Complete Evaluation  No modification of tasks or assist necessary to complete eval    OT Frequency 1x / week    OT Duration Other (comment)   8 visits over 10 weeks d/t any scheduling conflicts at recert - 0/93/26   OT Treatment/Interventions Self-care/ADL training;Aquatic Therapy;Patient/family education;Manual Therapy;Electrical Stimulation;Neuromuscular education;Functional Mobility Training;Passive range of motion;Visual/perceptual remediation/compensation;Therapeutic exercise;Ultrasound;Moist Heat;Fluidtherapy;DME and/or AE instruction;Therapeutic activities;Splinting    Plan reaching patterns, grasp and release with LUE    Consulted and Agree with Plan of Care Patient;Family member/caregiver    Family Member Consulted spouse             Patient will benefit from skilled therapeutic intervention in order to improve the following deficits and impairments:   Body Structure / Function / Physical Skills: ADL, GMC, FMC, Flexibility, Coordination, Sensation, IADL, ROM, UE functional use, Proprioception, Pain, Strength, Decreased knowledge of use of DME       Visit Diagnosis: 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-dominant side (HCC)  Other lack of coordination  Muscle weakness  (generalized)  Chronic left shoulder pain  Other abnormalities of gait and mobility  Unsteadiness on feet  Visuospatial deficit    Problem List Patient Active Problem List   Diagnosis Date Noted   Colon cancer screening    Left hemiplegia ([place]) 07/02/2020   Cognitive deficit due to recent stroke 07/02/2020   Dysphagia, post-stroke    Vascular headache    Acute blood loss anemia    AKI (acute kidney injury) ([place])    Essential hypertension    Seizures ([place])    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 [place], [place], [address] Phone: [phone]   Fax:  [phone]  Name: Arin Peral [name] MRN: [id number] Date of Birth: Dec 24, 1965

## 2021-03-31 NOTE — Patient Instructions (Signed)
  Aquatic Therapy: What to Expect!  Where:  [facility] [address] [place], [place]  [address] [phone]  NOTE:  You will receive an automated phone message reminding you of your appointment and it will say the appointment is at the Rehab [facility] on [address]  We are working to fix this- just know that you will meet us at the pool!  How to Prepare: Please make sure you drink 8 ounces of water about one hour prior to your pool session A caregiver MUST attend the entire session with the patient.  The caregiver will be responsible for assisting with dressing as well as any toileting needs.  If the patient will be doing a home program this should likely be the person who will assist as well.  Patients must wear either their street shoes or pool shoes until they are ready to enter the pool with the therapist.  Patients must also wear either street shoes or pool shoes once exiting the pool to walk to the locker room.  This will helps us prevent slips and falls.  Please arrive 15 minutes early to prepare for your pool therapy session Sign in at the front desk on the clipboard marked for [place] You may use the locker rooms on your right and then enter directly into the recreation pool (NOT the competition pool) Please make sure to attend to any toileting needs prior to entering the pool Please be dressed in your swim suit and on the pool deck at least 5 minutes before your appointment Once on the pool deck your therapist will ask you to sign the Patient  Consent and Assignment of Benefits form Your therapist may take your blood pressure prior to, during and after your session if indicated  About the pool  and parking: Entering the pool Your therapist will assist you; there are 2 ways to enter:  stairs with railings or with a chair lift.   Your therapist will determine the most appropriate way for you. Water temperature is usually between 86-87 degrees There  may be other swimmers in the pool at the same time Parking is free.   Contact Info:     Appointments: [facility]  All sessions are 45 minutes   [address]  [address]     Please call the  Ne[facility] Outpatient Center if   [place], [place]   [phone]    you need to cancel or reschedule an appointment.  [phone]       

## 2021-04-01 ENCOUNTER — Encounter: Payer: Self-pay | Admitting: Physical Therapy

## 2021-04-01 NOTE — Therapy (Signed)
[facility] [address] [place], [place], [address] Phone: [phone]   Fax:  [phone]  Physical Therapy Treatment  Patient Details  Name: [name] MRN: [id number] Date of Birth: 1965-11-07 Referring Provider (PT): Sula Soda, MD   Encounter Date: 03/31/2021   PT End of Session - 04/01/21 1039     Visit Number 12    Number of Visits 15   1st 5 visits are at no charge; 30 PT/OT combined   Date for PT Re-Evaluation 04/08/21    Authorization Type BCBS. 30 visits PT/OT combined, each dicipline counts as 1 visit    Authorization - Visit Number 12   PT and OT visits since 01-23-21   Authorization - Number of Visits 30    PT Start Time 1404    PT Stop Time 1447    PT Time Calculation (min) 43 min    Equipment Utilized During Treatment Gait belt    Activity Tolerance Patient tolerated treatment well    Behavior During Therapy WFL for tasks assessed/performed             Past Medical History:  Diagnosis Date   Anxiety    Cataract    removed left eye   Chronic combined systolic and diastolic CHF (congestive heart failure) (HCC)    EF 25-30%   Diastolic dysfunction    Dyslipidemia    Hyperlipidemia    Hypertension    Neuromuscular disorder (HCC)    neuropathy left foot   Nonischemic dilated cardiomyopathy (HCC)    felt secondary to HTN with no ischemia on nuclear stress test 05/2012, EF 25-30%   Seizures (HCC)    last seizure 05-2020- on Keppra   Shortness of breath    Stroke (HCC) 05/2020    Past Surgical History:  Procedure Laterality Date   COLONOSCOPY WITH PROPOFOL N/A 12/09/2020   Procedure: COLONOSCOPY WITH PROPOFOL;  Surgeon: Benancio Deeds, MD;  Location: [facility];  Service: Gastroenterology;  Laterality: N/A;   KNEE ARTHROSCOPY Left 04/13/2016   Procedure: ARTHROSCOPY KNEE WITH DEBRIDEMENT;  Surgeon: Valeria Batman, MD;  Location: [facility];  Service:  Orthopedics;  Laterality: Left;   KNEE ARTHROSCOPY WITH MEDIAL MENISECTOMY Right 04/13/2016   Procedure: KNEE ARTHROSCOPY WITH MEDIAL MENISECTOMY;  Surgeon: Valeria Batman, MD;  Location: [facility];  Service: Orthopedics;  Laterality: LEFT not right knee   NASAL FRACTURE SURGERY     SHOULDER SURGERY Left     There were no vitals filed for this visit.   Subjective Assessment - 04/01/21 1033     Subjective Pt reports he has MRI on Lt shoulder scheduled for next Wed., the 15th    Pertinent History HTN, cardiomyopathy, chronic combined systolic & diastolic CHF, alcohol use disorder, seizures    Currently in Pain? No/denies    Pain Onset More than a month ago                               [facility] Adult PT Treatment/Exercise - 04/01/21 0001       Transfers   Transfers Sit to Stand;Stand to Sit    Sit to Stand 4: Min guard    Stand to Sit 5: Supervision    Comments Rt foot on balance bubble for increased LLE weight bearing and strengthening      Ambulation/Gait   Ambulation/Gait Yes    Ambulation/Gait Assistance 4: Min  guard;4: Min assist    Ambulation/Gait Assistance Details AFO was initially donned but Lt foot and lower leg too swollen so AFO was unable to be used    Ambulation Distance (Feet) 115 Feet    Assistive device None    Gait Pattern Decreased step length - left;Decreased arm swing - left;Decreased stance time - left;Decreased hip/knee flexion - left;Decreased dorsiflexion - left;Decreased weight shift to left    Ambulation Surface Level;Indoor      Neuro Re-ed    Neuro Re-ed Details  Pt performed LLE SLS activity inside // bars - slow tap ups to 6" step with RUE support 10 reps;  performed stepping over and back of black balance beam 10 reps and then laterally 10 reps with RLE for LLE weight bearing and SLS - UE support on // bar      Exercises   Exercises Knee/Hip      Knee/Hip Exercises: Stretches   Gastroc Stretch Left;1 rep;2  reps;20 seconds   with use of 3" wedge block at counter     Knee/Hip Exercises: Standing   Forward Step Up Left;1 set;10 reps;Step Height: 6";Hand Hold: 2   inside // bars   Step Down Left;1 set;10 reps;Hand Hold: 2;Step Height: 6"   inside // bars                Balance Exercises - 04/01/21 0001       Balance Exercises: Standing   Rockerboard Anterior/posterior;EO;10 reps;UE support                PT Education - 04/01/21 1039     Education Details gave pt information sheet on aquatic therapy at [employer]) Educated Patient    Methods Explanation;Handout    Comprehension Verbalized understanding              PT Short Term Goals - 04/01/21 1043       PT SHORT TERM GOAL #1   Title Pt will improve Berg balance test score by at least 4 points to demo improvement in balance.  Increase Berg score to >/= 49/56 (Target date for all STG's 03-11-21)    Baseline 01/06/21: initial score set this date is 40/56;  46/56 on 02-10-21    Time 4    Period Weeks    Status New    Target Date 03/11/21      PT SHORT TERM GOAL #2   Title Improve TUG score from 38.87 secs to </= 33 secs with use of hemiwalker to demo improvement in functional mobility.; Upgraded STG - TUG score </= 26 secs with hemiwalker    Baseline 01/06/21: unable to reassess due to time contraints, 38.87 sec's at baseline;  29.53 secs on 02-10-21 with hemiwalker    Time 4    Period Weeks    Status New    Target Date 03/11/21      PT SHORT TERM GOAL #3   Title Amb. with SBQC 230' with CGA on flat, even surface for increased community accessibility.;  Amb. 460' with SBQC with SBA    Baseline 350' with SBQC on 02-10-21    Time 4    Period Weeks    Status New    Target Date 03/11/21      PT SHORT TERM GOAL #4   Title Assess foot up brace vs. AFO for LLE for safety with amb.    Time 4    Period Weeks    Status New  Target Date 03/11/21               PT Long Term Goals - 04/01/21 1043        PT LONG TERM GOAL #1   Title Improve Berg balance test score by at least 8 points for reduced fall risk.  Increase Berg score to >/= 52/56 for reduced fall risk.    Baseline improved 6 points from 40/56 on 01-06-21 to 46/56 on 02-10-21    Time 8    Period Weeks    Status New    Target Date 04/08/21      PT LONG TERM GOAL #2   Title Increase gait velocity from .68 ft/sec to >/= 1.4 ft/sec with hemiwalker for increased gait efficiency.  Upgraded LTG - increase gait speed to >/= 1.6 ft/sec with [facility]    Baseline 47.78 secs = .68 ft/sec;     02-10-21:   26.78 secs = 1.22 ft/sec with hemiwalker    Time 8    Period Weeks    Status New    Target Date 04/08/21      PT LONG TERM GOAL #3   Title Improve TUG score to </= 30 secs with hemiwalker to reduce fall risk and to demo incr. functional mobility. Upgraded LTG - score </=24 secs with SBQC    Baseline 38.87 secs with hemiwalker;   02-10-21--  29.53 secs with hemiwalker    Time 8    Period Weeks    Status New    Target Date 04/08/21      PT LONG TERM GOAL #4   Title Amb. 30' without use of assistive device with SBA for household amb. Upgraded goal - modified independent household amb. without device    Baseline hemiwalker - 24' with SBA to CGA;  30' with CGA as Lt foot does not clear floor occasionally    Time 8    Period Weeks    Status New    Target Date 04/08/21      PT LONG TERM GOAL #5   Title Amb. 500' with SPC with SBA for incr. community accessibility. Upgraded LTG - amb. 550' with SBQC on flat, even surface with SBA    Baseline 350' with SBQC on 02-10-21    Time 8    Period Weeks    Status New    Target Date 04/08/21      PT LONG TERM GOAL #6   Title Perform floor to stand transfer with UE support on object with SBA.    Time 8    Period Weeks    Status On-going    Target Date 04/08/21                   Plan - 04/01/21 1040     Clinical Impression Statement Pt's Lt foot and ankle too swollen for Lt  [place] AFO to be donned for gait training in today's session.  Pt gait trained without device with pt attempting to increase Lt knee in swing, resulting in slightly steppage gait with quick step with RLE due to decreased Lt SLS on LLE.  Pt needed UE support with LLE SLS exercises.  Pt to begin aquatic therapy next week.  Cont with POC.    Personal Factors and Comorbidities Behavior Pattern;Comorbidity 2;Profession;Transportation    Comorbidities cardiomyopathy, systolic & diastolic CHF, ICH, seizures    Examination-Activity Limitations Bend;Carry;Lift;Stand;Stairs;Squat;Locomotion Level;Transfers;Sleep;Bed Mobility    Examination-Participation Restrictions Cleaning;Community Activity;Driving;Shop;Laundry;Occupation;Meal Prep;Yard Work    Conservation officer, historic buildings  Evolving/Moderate complexity    Rehab Potential Good    PT Frequency 2x / week    PT Duration 8 weeks    PT Treatment/Interventions ADLs/Self Care Home Management;Aquatic Therapy;DME Instruction;Gait training;Stair training;Therapeutic activities;Therapeutic exercise;Balance training;Orthotic Fit/Training;Patient/family education;Neuromuscular re-education    PT Next Visit Plan Aquatic therapy - LLE strengthening and SLS exs. - gait train in water with UE support prn;  added additional ex's to HEP; continue to work on strengthening and balance. trial AFO on left LE    PT Home Exercise Plan Access Code: [email or web address]    Consulted and Agree with Plan of Care Patient;Family member/caregiver    Family Member Consulted wife [name]             Patient will benefit from skilled therapeutic intervention in order to improve the following deficits and impairments:  Abnormal gait, Decreased activity tolerance, Decreased balance, Decreased coordination, Decreased strength, Impaired tone, Impaired sensation, Impaired UE functional use, Impaired vision/preception, Pain  Visit Diagnosis: Hemiplegia and hemiparesis following cerebral  infarction affecting left non-dominant side (HCC)  Muscle weakness (generalized)  Other abnormalities of gait and mobility  Unsteadiness on feet     Problem List Patient Active Problem List   Diagnosis Date Noted   Colon cancer screening    Left hemiplegia (HCC) 07/02/2020   Cognitive deficit due to recent stroke 07/02/2020   Dysphagia, post-stroke    Vascular headache    Acute blood loss anemia    AKI (acute kidney injury) (HCC)    Essential hypertension    Seizures (HCC)    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   ICH (intracerebral hemorrhage) (HCC) 06/09/2020   Sprain of anterior talofibular ligament of left ankle 01/15/2020   Hyperlipidemia, on Lipitor 04/01/2017   Alcohol use disorder, mild, abuse 06/30/2016   Morbid obesity (HCC) 06/30/2016   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   Chronic combined systolic and diastolic CHF (congestive heart failure) (HCC) 11/08/2012   HTN (hypertension) 05/25/2012    Westin Knotts, Donavan Burnet, PT 04/01/2021, 10:45 AM  [facility] [address] [place], [place], [address] Phone: [phone]   Fax:  [phone]  Name: [name] MRN: [id number] Date of Birth: 09/18/1965

## 2021-04-04 ENCOUNTER — Ambulatory Visit: Payer: Self-pay | Admitting: Physical Therapy

## 2021-04-06 ENCOUNTER — Ambulatory Visit: Payer: BC Managed Care – PPO

## 2021-04-06 ENCOUNTER — Encounter: Payer: Self-pay | Admitting: Adult Health

## 2021-04-06 ENCOUNTER — Other Ambulatory Visit: Payer: Self-pay

## 2021-04-06 ENCOUNTER — Other Ambulatory Visit: Payer: Self-pay | Admitting: Physical Medicine and Rehabilitation

## 2021-04-06 DIAGNOSIS — I619 Nontraumatic intracerebral hemorrhage, unspecified: Secondary | ICD-10-CM | POA: Diagnosis not present

## 2021-04-06 DIAGNOSIS — G441 Vascular headache, not elsewhere classified: Secondary | ICD-10-CM

## 2021-04-06 DIAGNOSIS — G8929 Other chronic pain: Secondary | ICD-10-CM

## 2021-04-07 ENCOUNTER — Ambulatory Visit: Payer: BC Managed Care – PPO | Admitting: Physical Therapy

## 2021-04-07 ENCOUNTER — Ambulatory Visit: Payer: BC Managed Care – PPO | Admitting: Occupational Therapy

## 2021-04-07 ENCOUNTER — Encounter: Payer: Self-pay | Admitting: Occupational Therapy

## 2021-04-07 ENCOUNTER — Other Ambulatory Visit: Payer: Self-pay

## 2021-04-07 DIAGNOSIS — M6281 Muscle weakness (generalized): Secondary | ICD-10-CM

## 2021-04-07 DIAGNOSIS — R278 Other lack of coordination: Secondary | ICD-10-CM | POA: Diagnosis not present

## 2021-04-07 DIAGNOSIS — R2681 Unsteadiness on feet: Secondary | ICD-10-CM | POA: Diagnosis not present

## 2021-04-07 DIAGNOSIS — R2689 Other abnormalities of gait and mobility: Secondary | ICD-10-CM | POA: Diagnosis not present

## 2021-04-07 DIAGNOSIS — I69154 Hemiplegia and hemiparesis following nontraumatic intracerebral hemorrhage affecting left non-dominant side: Secondary | ICD-10-CM | POA: Diagnosis not present

## 2021-04-07 DIAGNOSIS — R41842 Visuospatial deficit: Secondary | ICD-10-CM | POA: Diagnosis not present

## 2021-04-07 DIAGNOSIS — I69354 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-dominant side: Secondary | ICD-10-CM

## 2021-04-07 DIAGNOSIS — M25512 Pain in left shoulder: Secondary | ICD-10-CM | POA: Diagnosis not present

## 2021-04-07 DIAGNOSIS — G8929 Other chronic pain: Secondary | ICD-10-CM | POA: Diagnosis not present

## 2021-04-07 NOTE — Therapy (Signed)
[facility] ?[address] ?[place], [place], [address] ?Phone: [phone]   Fax:  [phone] ? ?Occupational Therapy Treatment ? ?Patient Details  ?Name: [name] ?MRN: [id number] ?Date of Birth: 12/25/1965 ?Referring Provider (OT): Sula Soda, MD ? ? ?Encounter Date: 04/07/2021 ? ? OT End of Session - 04/07/21 1400   ? ? Visit Number 12   ? Number of Visits 20   ? Date for OT Re-Evaluation 05/26/21   8 additional visits in 10 weeks d/t any scheduling conflicts - updated at recert 02/10/21  ? Authorization Type BCBS   ? Authorization Time Period 1st 5 visits are at no charge, VL: PT/OT combined 30   ? OT Start Time 1359   ? OT Stop Time 1440   ? OT Time Calculation (min) 41 min   ? Activity Tolerance Patient tolerated treatment well   ? Behavior During Therapy [facility] for tasks assessed/performed   ? ?  ?  ? ?  ? ? ?Past Medical History:  ?Diagnosis Date  ? Anxiety   ? Cataract   ? removed left eye  ? Chronic combined systolic and diastolic CHF (congestive heart failure) (HCC)   ? EF 25-30%  ? Diastolic dysfunction   ? Dyslipidemia   ? Hyperlipidemia   ? Hypertension   ? Neuromuscular disorder (HCC)   ? neuropathy left foot  ? Nonischemic dilated cardiomyopathy (HCC)   ? felt secondary to HTN with no ischemia on nuclear stress test 05/2012, EF 25-30%  ? Seizures (HCC)   ? last seizure 05-2020- on Keppra  ? Shortness of breath   ? Stroke [facility]) 05/2020  ? ? ?Past Surgical History:  ?Procedure Laterality Date  ? COLONOSCOPY WITH PROPOFOL N/A 12/09/2020  ? Procedure: COLONOSCOPY WITH PROPOFOL;  Surgeon: Benancio Deeds, MD;  Location: [facility];  Service: Gastroenterology;  Laterality: N/A;  ? KNEE ARTHROSCOPY Left 04/13/2016  ? Procedure: ARTHROSCOPY KNEE WITH DEBRIDEMENT;  Surgeon: Valeria Batman, MD;  Location: [facility];  Service: Orthopedics;  Laterality: Left;  ? KNEE ARTHROSCOPY WITH MEDIAL MENISECTOMY Right 04/13/2016  ?  Procedure: KNEE ARTHROSCOPY WITH MEDIAL MENISECTOMY;  Surgeon: Valeria Batman, MD;  Location: [facility];  Service: Orthopedics;  Laterality: LEFT not right knee  ? NASAL FRACTURE SURGERY    ? SHOULDER SURGERY Left   ? ? ?There were no vitals filed for this visit. ? ? Subjective Assessment - 04/07/21 1359   ? ? Subjective  "I was here yesterday at that truck getting an MRI"   ? Pertinent History hypertension, obesity, cardiomyopathy, hyperlipidemia, CHF   ? Limitations Fall Risk. Sensory Deficits LUE   ? Patient Stated Goals driving, cooking, walking, bathing himself   ? Currently in Pain? No/denies   ? Pain Score 0-No pain   ? ?  ?  ? ?  ? ? ?Aquatics reviewed information about location, time and directions prior to first appt. Pt verbalized understanding. ? ?Grasp/Release and Coordination w Semicircle pegboard with LUE with mod/max difficulty. Pt req'd verbal and tactile cues for keeping elbow in and moving with slow and controlled movements. ? ?Gentle ROM with normal patterns for protraction and placing BUE on armrests of chair and working on anterior and posterior pelvic tilts with seated cat/cows with min verbal and tactile cues. ? ? ? ? ? ? ? ? ? ? ? ? ? ? ? ? ? ? ? ? ? ? OT Short Term Goals - 03/31/21  1509   ? ?  ? OT SHORT TERM GOAL #1  ? Title Pt will be independent with HEP targeting LUE range of motion and coordination   ? Time 4   ? Period Weeks   ? Status Achieved   pt req'd review 02/10/21  ? Target Date 01/05/21   ?  ? OT SHORT TERM GOAL #2  ? Title Pt will increase box and blocks score with LUE to 10 blocks or greater for increasing functional use of LUE.   ? Baseline R 40, L 7   ? Time 4   ? Period Weeks   ? Status Achieved   14 blocks on 03/31/21  ?  ? OT SHORT TERM GOAL #3  ? Title Pt will verbalize understanding of sensory safety strategies for increasing independence and safety   ? Time 4   ? Period Weeks   ? Status Achieved   ?  ? OT SHORT TERM GOAL #4  ? Title Pt will perform  simple warm meal prep and/or light housekeeping with supervision and incresed safety awareness d/t sensory deficits.   ? Time 4   ? Period Weeks   ? Status Achieved   pt reports doing his own laundry 02/10/21  ? ?  ?  ? ?  ? ? ? ? OT Long Term Goals - 03/31/21 1514   ? ?  ? OT LONG TERM GOAL #1  ? Title Pt will be independent with any updated HEPs   ? Time 8   ? Period Weeks   ? Status Achieved   ? Target Date 04/21/21   ?  ? OT LONG TERM GOAL #2  ? Title Pt will perform bathing with mod I and good safety awareness consistently, per report.   ? Time 8   ? Period Weeks   ? Status Achieved   pt reports doing this at his son in laws and requiring asssistance for getting in and out of shower at home - 02/24/21  ?  ? OT LONG TERM GOAL #3  ? Title Pt will assist with at least one home management and/or meal prep task per day in order to increase independence and decrease caregiver burden.   ? Time 8   ? Period Weeks   ? Status Achieved   pt reports consistently assistance with cooking with air fryer and laundry, etc  ?  ? OT LONG TERM GOAL #4  ? Title Pt will obtain a lightweight object from a low to mid level surface with LUE in order to increase ability to use LUE for functional reaching and use.   ? Time 8   ? Period Weeks   ? Status On-going   pt completing but with considerable amount of compensatory movement patterns  ?  ? OT LONG TERM GOAL #5  ? Title Pt will be independent with updated HEP for LUE normal movement patterns and NMR for low reaching   ? Time 8   ? Period Weeks   ? Status New   ? Target Date 05/26/21   ?  ? Long Term Additional Goals  ? Additional Long Term Goals Yes   ?  ? OT LONG TERM GOAL #6  ? Title Pt will increase functional use of LUE by completing box and blocks with score of 18 blocks or greater.   ? Baseline 14 blocks LUE   ? Time 8   ? Period Weeks   ? Status New   ? ?  ?  ? ?  ? ? ? ? ? ? ? ?  Plan - 04/07/21 1449   ? ? Clinical Impression Statement Pt continues to progress towards goals. Pt  limited by memory and recall deficits and understanding of condition and neuromuscular reeducation. Continue to reinforce. Pt scheduled for aquatics with OT next week. Will assess freq and visit limits after session for   ? OT Occupational Profile and History Problem Focused Assessment - Including review of records relating to presenting problem   ? Occupational performance deficits (Please refer to evaluation for details): ADL's;IADL's;Leisure   ? Body Structure / Function / Physical Skills ADL;GMC;FMC;Flexibility;Coordination;Sensation;IADL;ROM;UE functional use;Proprioception;Pain;Strength;Decreased knowledge of use of DME   ? Rehab Potential Good   ? Clinical Decision Making Limited treatment options, no task modification necessary   ? Comorbidities Affecting Occupational Performance: None   ? Modification or Assistance to Complete Evaluation  No modification of tasks or assist necessary to complete eval   ? OT Frequency 1x / week   ? OT Duration Other (comment)   8 visits over 10 weeks d/t any scheduling conflicts at recert - 02/10/21  ? OT Treatment/Interventions Self-care/ADL training;Aquatic Therapy;Patient/family education;Manual Therapy;Electrical Stimulation;Neuromuscular education;Functional Mobility Training;Passive range of motion;Visual/perceptual remediation/compensation;Therapeutic exercise;Ultrasound;Moist Heat;Fluidtherapy;DME and/or AE instruction;Therapeutic activities;Splinting   ? Plan reaching patterns, grasp and release with LUE, aquatics for LUE and balance/unsteadiness on feet, address plan of care PRN for pool vs land therapies.   ? Consulted and Agree with Plan of Care Patient;Family member/caregiver   ? Family Member Consulted spouse   ? ?  ?  ? ?  ? ? ?Patient will benefit from skilled therapeutic intervention in order to improve the following deficits and impairments:   ?Body Structure / Function / Physical Skills: ADL, GMC, FMC, Flexibility, Coordination, Sensation, IADL, ROM, UE  functional use, Proprioception, Pain, Strength, Decreased knowledge of use of DME ?  ?  ? ? ?Visit Diagnosis: ?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-dominant side (HCC)

## 2021-04-07 NOTE — Patient Instructions (Signed)
   Aquatic Therapy: What to Expect!  Where:  [facility] [address] [place], [place] [address] [phone]           How to Prepare: Please make sure you drink 8 ounces of water about one hour prior to your pool session A caregiver must attend the entire session with the patient (unless your primary therapists feels this is not necessary). The caregiver will be responsible for assisting with dressing as well as any toileting needs.  Please arrive IN YOUR SUIT and a few minutes prior to your appointment - this helps to avoid delays in starting your session. Please make sure to attend to any toileting needs prior to entering the pool Once on the pool deck your therapist will ask you to sign the Patient  Consent and Assignment of Benefits form Your therapist may take your blood pressure prior to, during and after your session if indicated We usually try and create a home exercise program based on activities we do in the pool.  Please be thinking about who might be able to assist you in the pool should you want to participate in an aquatic home exercise program at the time of discharge.  Some patients do not want to or do not have the ability to participate in an aquatic home program - this is not a barrier in any way to you participating in aquatic therapy as part of your current therapy plan!    About the pool: Entering the pool Your therapist will assist you; there are multiple ways to enter including stairs with railings, a walk in ramp, a roll in chair and a mechanical lift. Your therapist will determine the most appropriate way for you. Water temperature is usually between 86-87 degrees There may be other swimmers in the pool at the same time     Contact Info:             Appointments: [facility]         All sessions are 45 minutes   [address]  [address]            Please call the [facility]  if   [place],   [phone]           you need to cancel or reschedule an appointment.  [phone]           Suzanne Dilday, PT    Kris Gellert, OTR/L    07/16/19  

## 2021-04-08 ENCOUNTER — Encounter: Payer: Self-pay | Admitting: Physical Therapy

## 2021-04-08 ENCOUNTER — Ambulatory Visit: Payer: BC Managed Care – PPO

## 2021-04-08 NOTE — Therapy (Signed)
[place] ?[address] ?[place], [place], [address] ?Phone: [phone]   Fax:  [phone] ? ?Physical Therapy Treatment ? ?Patient Details  ?Name: [name] ?MRN: [id number] ?Date of Birth: 1965/10/06 ?Referring Provider (PT): Sula Soda, MD ? ? ?Encounter Date: 04/07/2021 ? ? PT End of Session - 04/08/21 0942   ? ? Visit Number 13   ? Number of Visits 19   1st 5 visits are at no charge; 30 PT/OT combined  ? Date for PT Re-Evaluation 04/08/21   ? Authorization Type BCBS. 30 visits PT/OT combined, each dicipline counts as 1 visit   ? Authorization - Visit Number 18   18 visits used for PT and OT visits since 01-23-21  ? Authorization - Number of Visits 30   ? PT Start Time 1450   ? PT Stop Time 1540   ? PT Time Calculation (min) 50 min   ? Equipment Utilized During Treatment Gait belt   ? Activity Tolerance Patient tolerated treatment well   ? Behavior During Therapy [facility] for tasks assessed/performed   ? ?  ?  ? ?  ? ? ?Past Medical History:  ?Diagnosis Date  ? Anxiety   ? Cataract   ? removed left eye  ? Chronic combined systolic and diastolic CHF (congestive heart failure) (HCC)   ? EF 25-30%  ? Diastolic dysfunction   ? Dyslipidemia   ? Hyperlipidemia   ? Hypertension   ? Neuromuscular disorder (HCC)   ? neuropathy left foot  ? Nonischemic dilated cardiomyopathy (HCC)   ? felt secondary to HTN with no ischemia on nuclear stress test 05/2012, EF 25-30%  ? Seizures (HCC)   ? last seizure 05-2020- on Keppra  ? Shortness of breath   ? Stroke [facility]) 05/2020  ? ? ?Past Surgical History:  ?Procedure Laterality Date  ? COLONOSCOPY WITH PROPOFOL N/A 12/09/2020  ? Procedure: COLONOSCOPY WITH PROPOFOL;  Surgeon: Benancio Deeds, MD;  Location: [facility];  Service: Gastroenterology;  Laterality: N/A;  ? KNEE ARTHROSCOPY Left 04/13/2016  ? Procedure: ARTHROSCOPY KNEE WITH DEBRIDEMENT;  Surgeon: Valeria Batman, MD;  Location: [facility];  Service: Orthopedics;  Laterality: Left;  ? KNEE ARTHROSCOPY WITH MEDIAL MENISECTOMY Right 04/13/2016  ? Procedure: KNEE ARTHROSCOPY WITH MEDIAL MENISECTOMY;  Surgeon: Valeria Batman, MD;  Location: [facility];  Service: Orthopedics;  Laterality: LEFT not right knee  ? NASAL FRACTURE SURGERY    ? SHOULDER SURGERY Left   ? ? ?There were no vitals filed for this visit. ? ? Subjective Assessment - 04/08/21 0938   ? ? Subjective Pt states he has continued swelling and pain (neuropathy) in LLE - reason for cancelling pool appt on Monday this week;  does not want to go for AFO consult at this time due to swelling and not being able to get an AFO which would fit inside his shoe - does not want a double metal upright AFO   ? Pertinent History HTN, cardiomyopathy, chronic combined systolic & diastolic CHF, alcohol use disorder, seizures   ? Currently in Pain? Yes   ? Pain Score 5    ? Pain Location Foot   ? Pain Orientation Left   ? Pain Descriptors / Indicators Tingling;Burning;Pins and needles   ? Pain Type Neuropathic pain   ? Pain Onset More than a month ago   ? Pain Frequency Constant   ? Aggravating Factors  no specific   ?  Pain Relieving Factors Medication (Gabapentin) helps some - has not tried the soaking in warm water & Epsom salts as Dr. Carlis Abbott recommended   ? ?  ?  ? ?  ? ? ? ? ? [facility] PT Assessment - 04/08/21 0001   ? ?  ? Berg Balance Test  ? Sit to Stand Able to stand without using hands and stabilize independently   ? Standing Unsupported Able to stand safely 2 minutes   with most weight on right side  ? Sitting with Back Unsupported but Feet Supported on Floor or Stool Able to sit safely and securely 2 minutes   ? Stand to Sit Sits safely with minimal use of hands   ? Transfers Able to transfer safely, minor use of hands   ? Standing Unsupported with Eyes Closed Able to stand 10 seconds safely   ? Standing Unsupported with Feet Together Able to place feet together independently and  stand 1 minute safely   ? From Standing, Reach Forward with Outstretched Arm Can reach confidently >25 cm (10")   8 inches  ? From Standing Position, Pick up Object from Floor Able to pick up shoe safely and easily   ? From Standing Position, Turn to Look Behind Over each Shoulder Looks behind one side only/other side shows less weight shift   left>right  ? Turn 360 Degrees Able to turn 360 degrees safely but slowly   > 6 seconds  ? Standing Unsupported, Alternately Place Feet on Step/Stool Able to complete >2 steps/needs minimal assist   ? Standing Unsupported, One Foot in Front Able to plae foot ahead of the other independently and hold 30 seconds   ? Standing on One Leg Able to lift leg independently and hold > 10 seconds   10.56 secs on RLE  ? Total Score 49   ? Berg comment: 37-45 significant risk   ? ?  ?  ? ?  ? ? ? ? ? ? ? ? ? ? ? ? ? ? ? ? [facility] Adult PT Treatment/Exercise - 04/08/21 0001   ? ?  ? Ambulation/Gait  ? Ambulation/Gait Yes   ? Ambulation/Gait Assistance 4: Min guard   ? Ambulation/Gait Assistance Details unable to don AFO on LLE due to edema   ? Ambulation Distance (Feet) 230 Feet   ? Assistive device None   ? Gait Pattern Decreased step length - left;Decreased arm swing - left;Decreased stance time - left;Decreased hip/knee flexion - left;Decreased dorsiflexion - left;Decreased weight shift to left   ? Ambulation Surface Level;Indoor   ? Gait velocity 26.06 = 1.26 ft/sec with SBQC   ? Gait Comments 88' with SBQC   ?  ? Standardized Balance Assessment  ? Standardized Balance Assessment Timed Up and Go Test   ?  ? Timed Up and Go Test  ? TUG Normal TUG   ? Normal TUG (seconds) 24.47   with SBQC  ?  ? Self-Care  ? Self-Care Other Self-Care Comments   ? Other Self-Care Comments  reviewed LTG's and progress with pt; reviewed stretches for Lt hamstrings and heel cords - pt reports not doing these stretches on daily basis   ? ?  ?  ? ?  ? ? ?TherAct;  Pt performed floor to stand transfer x 2 reps  with min assist on 1st rep and CGA to SBA on 2nd rep;  ?Pt used mat table to push up on for assist with standing;  pt needed verbal cues for positioning  on 1st rep  ? ? ? ? ? ? ? ? ? PT Short Term Goals - 04/08/21 0944   ? ?  ? PT SHORT TERM GOAL #1  ? Title Pt will improve Berg balance test score by at least 4 points to demo improvement in balance.  Increase Berg score to >/= 49/56 (Target date for all STG's 03-11-21)   ? Baseline 01/06/21: initial score set this date is 40/56;  46/56 on 02-10-21   ? Time 4   ? Period Weeks   ? Status New   ? Target Date 03/11/21   ?  ? PT SHORT TERM GOAL #2  ? Title Improve TUG score from 38.87 secs to </= 33 secs with use of hemiwalker to demo improvement in functional mobility.; Upgraded STG - TUG score </= 26 secs with hemiwalker   ? Baseline 01/06/21: unable to reassess due to time contraints, 38.87 sec's at baseline;  29.53 secs on 02-10-21 with hemiwalker   ? Time 4   ? Period Weeks   ? Status New   ? Target Date 03/11/21   ?  ? PT SHORT TERM GOAL #3  ? Title Amb. with SBQC 230' with CGA on flat, even surface for increased community accessibility.;  Amb. 460' with SBQC with SBA   ? Baseline 350' with SBQC on 02-10-21   ? Time 4   ? Period Weeks   ? Status New   ? Target Date 03/11/21   ?  ? PT SHORT TERM GOAL #4  ? Title Assess foot up brace vs. AFO for LLE for safety with amb.   ? Time 4   ? Period Weeks   ? Status New   ? Target Date 03/11/21   ? ?  ?  ? ?  ? ? ? ? PT Long Term Goals - 04/07/21 1458   ? ?  ? PT LONG TERM GOAL #1  ? Title Improve Berg balance test score by at least 8 points for reduced fall risk.  Increase Berg score to >/= 52/56 for reduced fall risk.   ? Baseline improved 6 points from 40/56 on 01-06-21 to 46/56 on 02-10-21;  49/56 on 04-07-21   ? Time 6   ? Period Weeks   ? Status On-going   ? Target Date 05/20/21   ?  ? PT LONG TERM GOAL #2  ? Title Increase gait velocity from .68 ft/sec to >/= 1.4 ft/sec with hemiwalker for increased gait efficiency.   Upgraded LTG - increase gait speed to >/= 1.6 ft/sec with SBQC   ? Baseline 47.78 secs = .68 ft/sec;     02-10-21:   26.78 secs = 1.22 ft/sec with hemiwalker;  26.06 secs with SBQC = 1.26 ft/sec   ? Time 6   ? Peri

## 2021-04-11 ENCOUNTER — Ambulatory Visit: Payer: BC Managed Care – PPO | Admitting: Occupational Therapy

## 2021-04-14 ENCOUNTER — Other Ambulatory Visit: Payer: Self-pay

## 2021-04-14 ENCOUNTER — Ambulatory Visit: Payer: BC Managed Care – PPO | Admitting: Physical Therapy

## 2021-04-14 ENCOUNTER — Other Ambulatory Visit (HOSPITAL_COMMUNITY): Payer: Self-pay

## 2021-04-14 ENCOUNTER — Encounter: Payer: Self-pay | Admitting: Physical Therapy

## 2021-04-14 ENCOUNTER — Ambulatory Visit: Payer: BC Managed Care – PPO | Admitting: Occupational Therapy

## 2021-04-14 DIAGNOSIS — M6281 Muscle weakness (generalized): Secondary | ICD-10-CM

## 2021-04-14 DIAGNOSIS — R41842 Visuospatial deficit: Secondary | ICD-10-CM | POA: Diagnosis not present

## 2021-04-14 DIAGNOSIS — I69354 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-dominant side: Secondary | ICD-10-CM | POA: Diagnosis not present

## 2021-04-14 DIAGNOSIS — M25512 Pain in left shoulder: Secondary | ICD-10-CM | POA: Diagnosis not present

## 2021-04-14 DIAGNOSIS — R2681 Unsteadiness on feet: Secondary | ICD-10-CM | POA: Diagnosis not present

## 2021-04-14 DIAGNOSIS — R278 Other lack of coordination: Secondary | ICD-10-CM | POA: Diagnosis not present

## 2021-04-14 DIAGNOSIS — I69154 Hemiplegia and hemiparesis following nontraumatic intracerebral hemorrhage affecting left non-dominant side: Secondary | ICD-10-CM | POA: Diagnosis not present

## 2021-04-14 DIAGNOSIS — R2689 Other abnormalities of gait and mobility: Secondary | ICD-10-CM

## 2021-04-14 DIAGNOSIS — G8929 Other chronic pain: Secondary | ICD-10-CM | POA: Diagnosis not present

## 2021-04-14 NOTE — Therapy (Signed)
[place] ?[facility] [place] ?[place], [place], [address] ?Phone: [phone]   Fax:  [phone] ? ?Physical Therapy Treatment ? ?Patient Details  ?Name: [name] ?MRN: [id number] ?Date of Birth: 03/19/1965 ?Referring Provider (PT): Leeroy Cha, MD ? ? ?Encounter Date: 04/14/2021 ? ? PT End of Session - 04/14/21 1556   ? ? Visit Number 14   ? Number of Visits 19   1st 5 visits are at no charge; 30 PT/OT combined  ? Date for PT Re-Evaluation 04/08/21   ? Authorization Type BCBS. 30 visits PT/OT combined, each dicipline counts as 1 visit   ? Authorization - Visit Number 26   37 visits used for PT and OT visits since 01-23-21  ? Authorization - Number of Visits 30   ? PT Start Time 1535   ? PT Stop Time 1623   ? PT Time Calculation (min) 48 min   ? Equipment Utilized During Treatment Gait belt   ? Activity Tolerance Patient tolerated treatment well   ? Behavior During Therapy [facility] for tasks assessed/performed   ? ?  ?  ? ?  ?  ? ? ?Past Medical History:  ?Diagnosis Date  ? Anxiety   ? Cataract   ? removed left eye  ? Chronic combined systolic and diastolic CHF (congestive heart failure) ([place])   ? EF 25-30%  ? Diastolic dysfunction   ? Dyslipidemia   ? Hyperlipidemia   ? Hypertension   ? Neuromuscular disorder ([place])   ? neuropathy left foot  ? Nonischemic dilated cardiomyopathy ([place])   ? felt secondary to HTN with no ischemia on nuclear stress test 05/2012, EF 25-30%  ? Seizures ([place])   ? last seizure 05-2020- on Keppra  ? Shortness of breath   ? Stroke [facility]) 05/2020  ? ? ?Past Surgical History:  ?Procedure Laterality Date  ? COLONOSCOPY WITH PROPOFOL N/A 12/09/2020  ? Procedure: COLONOSCOPY WITH PROPOFOL;  Surgeon: Yetta Flock, MD;  [facility];  Service: Gastroenterology;  Laterality: N/A;  ? KNEE ARTHROSCOPY Left 04/13/2016  ? Procedure: ARTHROSCOPY KNEE WITH DEBRIDEMENT;  Surgeon: Garald Balding, MD;  Location: [place];  Service: Orthopedics;  Laterality: Left;  ? KNEE ARTHROSCOPY WITH MEDIAL MENISECTOMY Right 04/13/2016  ? Procedure: KNEE ARTHROSCOPY WITH MEDIAL MENISECTOMY;  Surgeon: Garald Balding, MD;  Location: [place];  Service: Orthopedics;  Laterality: LEFT not right knee  ? NASAL FRACTURE SURGERY    ? SHOULDER SURGERY Left   ? ? ?There were no vitals filed for this visit. ? ? ? ? ? ? ? ? ? ? ? ?Gait:   ?Pt gait trained with SBQC 350' with CGA to SBA due to occasional catching of Lt foot; pt's LLE too swollen to don Lt [place] AFO ?Pt gait trained without device 350' with CGA to min assist x1 due to Lt foot catching floor; cues for increased weight shift and stance time on  ?LLE to reduce "stomping" with RLE in stance and also cues to land softly on RLE  ? ? ? ? ? ?Self Care;  discussed AFO with pt's wife - pt unable to don [place] Walk on AFO due to edema; instructed pt to elevate LLE to reduce swelling ?Pt was given order for AFO from Epic ? ?NeuroR-ed: ?Pt performed LLE weight bearing exercise - tapped 4" step with RLE 10 reps with RUE support on quad cane prn with min assist ? ? ? ? ?  TherAct;  ? Pt performed floor to stand transfer x 1 rep with CGA for positioning LLE to tall kneeling due to pt's shoe  catching on mat ?Pt used mat table to push up on for assist with standing.  No verbal cues needed for positioning ? ? ? ? ? ? ? ? ? PT Short Term Goals - 04/08/21 0944   ? ?  ? PT SHORT TERM GOAL #1  ? Title Pt will improve Berg balance test score by at least 4 points to demo improvement in balance.  Increase Berg score to >/= 49/56 (Target date for all STG's 03-11-21)   ? Baseline 01/06/21: initial score set this date is 40/56;  46/56 on 02-10-21   ? Time 4   ? Period Weeks   ? Status New   ? Target Date 03/11/21   ?  ? PT SHORT TERM GOAL #2  ? Title Improve TUG score from 38.87 secs to </= 33 secs with use of hemiwalker to demo improvement in functional mobility.; Upgraded STG - TUG score  </= 26 secs with hemiwalker   ? Baseline 01/06/21: unable to reassess due to time contraints, 38.87 sec's at baseline;  29.53 secs on 02-10-21 with hemiwalker   ? Time 4   ? Period Weeks   ? Status New   ? Target Date 03/11/21   ?  ? PT SHORT TERM GOAL #3  ? Title Amb. with SBQC 230' with CGA on flat, even surface for increased community accessibility.;  Amb. 460' with SBQC with SBA   ? Baseline 350' with SBQC on 02-10-21   ? Time 4   ? Period Weeks   ? Status New   ? Target Date 03/11/21   ?  ? PT SHORT TERM GOAL #4  ? Title Assess foot up brace vs. AFO for LLE for safety with amb.   ? Time 4   ? Period Weeks   ? Status New   ? Target Date 03/11/21   ? ?  ?  ? ?  ? ? ? ? PT Long Term Goals - 04/07/21 1458   ? ?  ? PT LONG TERM GOAL #1  ? Title Improve Berg balance test score by at least 8 points for reduced fall risk.  Increase Berg score to >/= 52/56 for reduced fall risk.   ? Baseline improved 6 points from 40/56 on 01-06-21 to 46/56 on 02-10-21;  49/56 on 04-07-21   ? Time 6   ? Period Weeks   ? Status On-going   ? Target Date 05/20/21   ?  ? PT LONG TERM GOAL #2  ? Title Increase gait velocity from .68 ft/sec to >/= 1.4 ft/sec with hemiwalker for increased gait efficiency.  Upgraded LTG - increase gait speed to >/= 1.6 ft/sec with SBQC   ? Baseline 47.78 secs = .68 ft/sec;     02-10-21:   26.78 secs = 1.22 ft/sec with hemiwalker;  26.06 secs with SBQC = 1.26 ft/sec   ? Time 6   ? Period Weeks   ? Status On-going   ? Target Date 05/20/21   ?  ? PT LONG TERM GOAL #3  ? Title Improve TUG score to </= 30 secs with hemiwalker to reduce fall risk and to demo incr. functional mobility. Upgraded LTG - score </=24 secs with SBQC   ? Baseline 38.87 secs with hemiwalker;   02-10-21--  29.53 secs with hemiwalker; 04-07-21 =  24.47 secs with SBQC   ?   Time 6   ? Period Weeks   ? Status Partially Met   ? Target Date 05/20/21   ?  ? PT LONG TERM GOAL #4  ? Title Amb. 30' without use of assistive device with SBA for household amb.  Upgraded goal - modified independent household amb. without device   ? Baseline hemiwalker - 80' with SBA to CGA;  30' with CGA as Lt foot does not clear floor occasionally   ? Time 6   ? Period Weeks   ? Status On-going   ? Target Date 05/20/21   ?  ? PT LONG TERM GOAL #5  ? Title Amb. 500' with SPC with SBA for incr. community accessibility. Upgraded LTG - amb. 550' with SBQC on flat, even surface with SBA   ? Baseline 350' with SBQC on 02-10-21   ? Time 6   ? Period Weeks   ? Status On-going   ? Target Date 05/20/21   ?  ? PT LONG TERM GOAL #6  ? Title Perform floor to stand transfer with UE support on object with SBA.   ? Baseline met 04-07-21   ? Time 8   ? Period Weeks   ? Status Achieved   ? Target Date 04/08/21   ? ?  ?  ? ?  ? ? ? ? ? ? ? ? ? ? ?Patient will benefit from skilled therapeutic intervention in order to improve the following deficits and impairments:    ? ?Visit Diagnosis: ?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-dominant side (HCC) ? ?Muscle weakness (generalized) ? ?Other abnormalities of gait and mobility ? ?Unsteadiness on feet ? ? ? ? ?Problem List ?Patient Active Problem List  ? Diagnosis Date Noted  ? Colon cancer screening   ? Left hemiplegia (HCC) 07/02/2020  ? 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 Dysphagia, post-stroke   ? Vascular headache   ? Acute blood loss anemia   ? AKI (acute kidney injury) (HCC)   ? Essential hypertension   ? Seizures (HCC)   ? 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 ICH (intracerebral hemorrhage) (HCC) 06/09/2020  ? Sprain of anterior talofibular ligament of left ankle 01/15/2020  ? Hyperlipidemia, on Lipitor 04/01/2017  ? Alcohol use disorder, mild, abuse 06/30/2016  ? Morbid obesity (HCC) 06/30/2016  ? 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  ? Chronic combined systolic and diastolic CHF (congestive heart failure) (HCC) 11/08/2012  ? HTN (hypertension) 05/25/2012  ? ? ?,  Suzanne,  PT ?04/15/2021, 8:32 AM ? ?[place] [facility] ?[address] ?[place], [place], [address] ?Phone: [phone]   Fax:  [phone] ? ?Name: [name] ?MR

## 2021-04-15 ENCOUNTER — Encounter: Payer: Self-pay | Admitting: Physical Therapy

## 2021-04-17 ENCOUNTER — Other Ambulatory Visit: Payer: Self-pay

## 2021-04-17 ENCOUNTER — Ambulatory Visit
Admission: RE | Admit: 2021-04-17 | Discharge: 2021-04-17 | Disposition: A | Discharge status: Home / Self Care | Payer: BC Managed Care – PPO | Source: Ambulatory Visit | Attending: Physical Medicine and Rehabilitation | Admitting: Physical Medicine and Rehabilitation

## 2021-04-17 DIAGNOSIS — M19012 Primary osteoarthritis, left shoulder: Secondary | ICD-10-CM | POA: Diagnosis not present

## 2021-04-17 DIAGNOSIS — M65812 Other synovitis and tenosynovitis, left shoulder: Secondary | ICD-10-CM | POA: Diagnosis not present

## 2021-04-17 DIAGNOSIS — R531 Weakness: Secondary | ICD-10-CM | POA: Diagnosis not present

## 2021-04-17 DIAGNOSIS — M25412 Effusion, left shoulder: Secondary | ICD-10-CM | POA: Diagnosis not present

## 2021-04-17 DIAGNOSIS — G8929 Other chronic pain: Secondary | ICD-10-CM

## 2021-04-18 ENCOUNTER — Other Ambulatory Visit: Payer: Self-pay | Admitting: Physical Medicine and Rehabilitation

## 2021-04-18 ENCOUNTER — Ambulatory Visit: Payer: BC Managed Care – PPO | Admitting: Occupational Therapy

## 2021-04-18 DIAGNOSIS — R569 Unspecified convulsions: Secondary | ICD-10-CM | POA: Diagnosis not present

## 2021-04-18 DIAGNOSIS — I610 Nontraumatic intracerebral hemorrhage in hemisphere, subcortical: Secondary | ICD-10-CM | POA: Diagnosis not present

## 2021-04-18 DIAGNOSIS — G8929 Other chronic pain: Secondary | ICD-10-CM

## 2021-04-18 DIAGNOSIS — I5042 Chronic combined systolic (congestive) and diastolic (congestive) heart failure: Secondary | ICD-10-CM | POA: Diagnosis not present

## 2021-04-19 ENCOUNTER — Other Ambulatory Visit: Payer: Self-pay

## 2021-04-19 ENCOUNTER — Encounter (HOSPITAL_BASED_OUTPATIENT_CLINIC_OR_DEPARTMENT_OTHER): Payer: BC Managed Care – PPO | Admitting: Physical Medicine and Rehabilitation

## 2021-04-19 DIAGNOSIS — G8929 Other chronic pain: Secondary | ICD-10-CM

## 2021-04-19 DIAGNOSIS — M25512 Pain in left shoulder: Secondary | ICD-10-CM

## 2021-04-19 NOTE — Progress Notes (Signed)
? ?Subjective:  ? ? Patient ID: [name], male    DOB: 1965/07/09, 56 y.o.   MRN: [id number] ? ?HPI ?An audio/video tele-health visit is felt to be the most appropriate encounter for this patient at this time. This is a follow up tele-visit via phone. The patient is at home. MD is at office. Prior to scheduling this appointment, our staff discussed the limitations of evaluation and management by telemedicine and the availability of in-person appointments. The patient expressed understanding and agreed to proceed.  ? ?[name] presents for follow-up of left sided hemiplegia, left shoulder pain, right sided lower back pain, and left sided foot neuropathic pain following CVA, left shoulder pain. ? ?1) CVA ?-starting water therapy soon'-incredible improvement in ambulation! ?-his therapists are planning to take him to the pool and told him this may help with his arm pain ?-pain has been severe at home, as it had been in hospital ?-his wife purchased 500mg  tablets of tylenol ?-he is worried about his decreased upper extremity strength and asks if this will ever improve ?-he is not experiencing much shoulder pain right now ?-he does have some strength ?-the benefits from the trigger point injections lwore off within 1 week ?-pain is severely limiting his function ?-he is taking Tizanidine and it does not help, does make him sleepy, but his wife is trying to encourage him to stay up during the day ?-he developed the foot pain a couple of weeks ago ?-he was wearing a boot at that time so took it off but has still had pain a couple of weeks afterward. ?-he has not tried anything for this pain ?-it is worst at night and feels like burning and tingling at his heel. ?-he has been doing great with therapy and is walking in his home! ?-his wife asks if he could get a handicap placard.  ?-the foot is still feeling numb with pins and needles, also painful. He is unsure whether Gabapentin helps- so far nothing he as tried has  helped his neuropathy. His balance is also now off and he has been stumbling more. He actually felt that the Gabapentin made his pain worse. He has never tried Cymbalta but is willing to try it.  ?-Has been able to use bathroom himself! ?-he is working with therapy right now.  ?-he was feeling wobbly over the weekend so had to miss his PT session ?-shoulder is feeling very numb and cannot make a fist ?-ready to proceed with Qutenza today ?-His wife asks for refill of Eucerin and Diclofenac and Tizanidine.  ?-She asks whether he should still be taking Seroquel ?-he called medical records and records were faxed on December 28th.  ?-He called our office today  ?-He needs medical records to process his disability claims ?-able to walk much better.  ?-his wife mentioned to me that she is still worried about him driving. He has been driving around locally to pharmacy  ? ?2) Lower extremity edema ?-worsening ?-not painful ?-he asks when his next appointment with me is.  ?-has gotten used to it ?-therapist was concerned ?-painful ?-sometimes feels cold on the left foot ?-never took the fluid pills ?-he is hoping to walk more now that the weather has improved ? ?3) Headaches ?-started last week and felt loss of balance ?-this stopped him from doing therapy.  ?-still having tension in head.  ?-he takes topamax 25mg  HS.  ? ?4) Obesity ?-he has lost 36 lbs! ?-214 lbs  ? ?5) Left  shoulder pain ?-intermittent ?-sometimes he uses voltaren gel and this seems to help the most ? ? ? ?Family History  ?Problem Relation Age of Onset  ? Hypertension Mother   ?     Passed away when she is 16 secondary to hip surgery complications  ? CAD Mother   ? Heart attack Mother   ? Hypertension Father   ? Diabetes Brother   ? Stroke Brother   ? Heart attack Maternal Grandmother   ? Colon cancer Neg Hx   ? Esophageal cancer Neg Hx   ? Pancreatic cancer Neg Hx   ? Stomach cancer Neg Hx   ? Colon polyps Neg Hx   ? ?Social History  ? ?Socioeconomic  History  ? Marital status: Married  ?  Spouse name: Not on file  ? Number of children: Not on file  ? Years of education: Not on file  ? Highest education level: Not on file  ?Occupational History  ? Not on file  ?Tobacco Use  ? Smoking status: Never  ? Smokeless tobacco: Never  ?Vaping Use  ? Vaping Use: Never used  ?Substance and Sexual Activity  ? Alcohol use: Not Currently  ? Drug use: No  ? Sexual activity: Yes  ?Other Topics Concern  ? Not on file  ?Social History Narrative  ? Not on file  ? ?Social Determinants of Health  ? ?Financial Resource Strain: Not on file  ?Food Insecurity: Not on file  ?Transportation Needs: Not on file  ?Physical Activity: Not on file  ?Stress: Not on file  ?Social Connections: Not on file  ? ?Past Surgical History:  ?Procedure Laterality Date  ? COLONOSCOPY WITH PROPOFOL N/A 12/09/2020  ? Procedure: COLONOSCOPY WITH PROPOFOL;  Surgeon: Benancio Deeds, MD;  Location: [facility];  Service: Gastroenterology;  Laterality: N/A;  ? KNEE ARTHROSCOPY Left 04/13/2016  ? Procedure: ARTHROSCOPY KNEE WITH DEBRIDEMENT;  Surgeon: Valeria Batman, MD;  Location: [facility];  Service: Orthopedics;  Laterality: Left;  ? KNEE ARTHROSCOPY WITH MEDIAL MENISECTOMY Right 04/13/2016  ? Procedure: KNEE ARTHROSCOPY WITH MEDIAL MENISECTOMY;  Surgeon: Valeria Batman, MD;  Location: [facility];  Service: Orthopedics;  Laterality: LEFT not right knee  ? NASAL FRACTURE SURGERY    ? SHOULDER SURGERY Left   ? ?Past Medical History:  ?Diagnosis Date  ? Anxiety   ? Cataract   ? removed left eye  ? Chronic combined systolic and diastolic CHF (congestive heart failure) (HCC)   ? EF 25-30%  ? Diastolic dysfunction   ? Dyslipidemia   ? Hyperlipidemia   ? Hypertension   ? Neuromuscular disorder (HCC)   ? neuropathy left foot  ? Nonischemic dilated cardiomyopathy (HCC)   ? felt secondary to HTN with no ischemia on nuclear stress test 05/2012, EF 25-30%  ? Seizures (HCC)   ? last  seizure 05-2020- on Keppra  ? Shortness of breath   ? Stroke [facility]) 05/2020  ? ?There were no vitals taken for this visit. ? ?Opioid Risk Score:   ?Fall Risk Score:  `1 ? ?Depression screen PHQ 2/9 ? ? ?  03/21/2021  ? 12:36 PM 09/22/2020  ? 11:01 AM 09/14/2020  ?  4:02 PM 09/14/2020  ?  2:29 PM 07/30/2020  ? 11:18 AM  ?Depression screen PHQ 2/9  ?Decreased Interest 0 0 0 0 0  ?Down, Depressed, Hopeless 0 0 0 0 0  ?PHQ - 2 Score 0 0 0 0 0  ?Altered sleeping  0  0  ?Tired, decreased energy   0  0  ?Change in appetite   0  0  ?Feeling bad or failure about yourself    0  0  ?Trouble concentrating   0  0  ?Moving slowly or fidgety/restless   0  0  ?Suicidal thoughts   0  0  ?PHQ-9 Score   0  0  ?Difficult doing work/chores  Not difficult at all   Not difficult at all  ? ? ?Review of Systems ? ?   ?Objective:  ? Physical Exam ?Not performed as patient was seen via phone. ?   ?Assessment & Plan:  ?[name] is a 56 year old man presenting today for follow-up of left hemiplegia, right sided low back pain, and left sided foot pain following stroke, left shoulder pain. ? ?1) Right sided low back pain: ?-discussed with patient and wife that Xrs show spinal degeneration ?-pain appears to be more myofascial ?-trigger point injections with lidocaine provided <1 week of benefit ?-Would like to try Botox injections for longer relief as this pain is greatly inhibiting patient's function and quality of life ?-continue heat and lidocaine patch and home physical and occupational therapy ?-Can take tylenol 500mg  three times per day ?-minimize use of Norco as much as possible as not benefiting and is a high risk opioid ?-can continue tizandine as needed.  ? ?2) left sided foot pain ?-stop Gabapentin  ?-he is purchasing a stimulator that he plans to use on his arm and foot.  ?-start Cymbalta 20mg  daily, discussed side effects of nausea.  ?-recommended icing 15 minutes three times per day ?-hold off on therapy until neuropathy improves ?-can  keep compression garment and boot off since these were worsening his pain ?-Will not repeat Qutenza since it did not did help foot pain ?-Turmeric to reduce inflammation--can be used in cooking or taken as a

## 2021-04-21 ENCOUNTER — Ambulatory Visit: Payer: BC Managed Care – PPO | Admitting: Occupational Therapy

## 2021-04-21 ENCOUNTER — Ambulatory Visit: Payer: BC Managed Care – PPO | Admitting: Physical Therapy

## 2021-04-21 NOTE — Therapy (Deleted)
?OUTPATIENT OCCUPATIONAL THERAPY TREATMENT NOTE ? ? ?Patient Name: [name] ?MRN: [id number] ?DOB:May 10, 1965, 56 y.o., male ?Today's Date: 04/21/2021 ? ?PCP: Rema Fendt, NP ?REFERRING PROVIDER: Rema Fendt, NP ? ? ? ?Past Medical History:  ?Diagnosis Date  ? Anxiety   ? Cataract   ? removed left eye  ? Chronic combined systolic and diastolic CHF (congestive heart failure) (HCC)   ? EF 25-30%  ? Diastolic dysfunction   ? Dyslipidemia   ? Hyperlipidemia   ? Hypertension   ? Neuromuscular disorder (HCC)   ? neuropathy left foot  ? Nonischemic dilated cardiomyopathy (HCC)   ? felt secondary to HTN with no ischemia on nuclear stress test 05/2012, EF 25-30%  ? Seizures (HCC)   ? last seizure 05-2020- on Keppra  ? Shortness of breath   ? Stroke [facility]) 05/2020  ? ?Past Surgical History:  ?Procedure Laterality Date  ? COLONOSCOPY WITH PROPOFOL N/A 12/09/2020  ? Procedure: COLONOSCOPY WITH PROPOFOL;  Surgeon: Benancio Deeds, MD;  Location: [facility];  Service: Gastroenterology;  Laterality: N/A;  ? KNEE ARTHROSCOPY Left 04/13/2016  ? Procedure: ARTHROSCOPY KNEE WITH DEBRIDEMENT;  Surgeon: Valeria Batman, MD;  Location: [facility];  Service: Orthopedics;  Laterality: Left;  ? KNEE ARTHROSCOPY WITH MEDIAL MENISECTOMY Right 04/13/2016  ? Procedure: KNEE ARTHROSCOPY WITH MEDIAL MENISECTOMY;  Surgeon: Valeria Batman, MD;  Location: [facility];  Service: Orthopedics;  Laterality: LEFT not right knee  ? NASAL FRACTURE SURGERY    ? SHOULDER SURGERY Left   ? ?Patient Active Problem List  ? Diagnosis Date Noted  ? Colon cancer screening   ? Left hemiplegia (HCC) 07/02/2020  ? 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 Dysphagia, post-stroke   ? Vascular headache   ? Acute blood loss anemia   ? AKI (acute kidney injury) (HCC)   ? Essential hypertension   ? Seizures (HCC)   ? 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 ICH (intracerebral hemorrhage) (HCC) 06/09/2020  ? Sprain  of anterior talofibular ligament of left ankle 01/15/2020  ? Hyperlipidemia, on Lipitor 04/01/2017  ? Alcohol use disorder, mild, abuse 06/30/2016  ? Morbid obesity (HCC) 06/30/2016  ? 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  ? Chronic combined systolic and diastolic CHF (congestive heart failure) (HCC) 11/08/2012  ? HTN (hypertension) 05/25/2012  ? ? ?ONSET DATE: 06/08/20 ? ?REFERRING DIAG: Thalamic Hemorrhage/ L Hemiplegia ? ?THERAPY DIAG:  ?No diagnosis found. ? ? ?PERTINENT HISTORY: hypertension, obesity, cardiomyopathy, hyperlipidemia, CHF  ? ?PRECAUTIONS: Fall Risk. Sensory Deficits LUE  ? ?SUBJECTIVE: *** ? ?PAIN:  ?Are you having pain? {OPRCPAIN:27236} ? ? ? ? ?OBJECTIVE:  ? ?TODAY'S TREATMENT:  ?*** ? ? ?PATIENT EDUCATION: ?Education details: *** ?Person educated: {Person educated:25204} ?Education method: {Education Method:25205} ?Education comprehension: {Education Comprehension:25206} ? ? ?HOME EXERCISE PROGRAM ?*** ? ? OT Short Term Goals - 03/31/21 1509   ? ?  ? OT SHORT TERM GOAL #1  ? Title Pt will be independent with HEP targeting LUE range of motion and coordination   ? Time 4   ? Period Weeks   ? Status Achieved   pt req'd review 02/10/21  ? Target Date 01/05/21   ?  ? OT SHORT TERM GOAL #2  ? Title Pt will increase box and blocks score with LUE to 10 blocks or greater for increasing functional use of LUE.   ? Baseline R 40, L 7   ? Time 4   ? Period  Weeks   ? Status Achieved   14 blocks on 03/31/21  ?  ? OT SHORT TERM GOAL #3  ? Title Pt will verbalize understanding of sensory safety strategies for increasing independence and safety   ? Time 4   ? Period Weeks   ? Status Achieved   ?  ? OT SHORT TERM GOAL #4  ? Title Pt will perform simple warm meal prep and/or light housekeeping with supervision and incresed safety awareness d/t sensory deficits.   ? Time 4   ? Period Weeks   ? Status Achieved   pt reports doing his own laundry 02/10/21  ? ?  ?  ? ?  ? ? ? OT Long Term Goals - 03/31/21  1514   ? ?  ? OT LONG TERM GOAL #1  ? Title Pt will be independent with any updated HEPs   ? Time 8   ? Period Weeks   ? Status Achieved   ? Target Date 04/21/21   ?  ? OT LONG TERM GOAL #2  ? Title Pt will perform bathing with mod I and good safety awareness consistently, per report.   ? Time 8   ? Period Weeks   ? Status Achieved   pt reports doing this at his son in laws and requiring asssistance for getting in and out of shower at home - 02/24/21  ?  ? OT LONG TERM GOAL #3  ? Title Pt will assist with at least one home management and/or meal prep task per day in order to increase independence and decrease caregiver burden.   ? Time 8   ? Period Weeks   ? Status Achieved   pt reports consistently assistance with cooking with air fryer and laundry, etc  ?  ? OT LONG TERM GOAL #4  ? Title Pt will obtain a lightweight object from a low to mid level surface with LUE in order to increase ability to use LUE for functional reaching and use.   ? Time 8   ? Period Weeks   ? Status On-going   pt completing but with considerable amount of compensatory movement patterns  ?  ? OT LONG TERM GOAL #5  ? Title Pt will be independent with updated HEP for LUE normal movement patterns and NMR for low reaching   ? Time 8   ? Period Weeks   ? Status New   ? Target Date 05/26/21   ?  ? Long Term Additional Goals  ? Additional Long Term Goals Yes   ?  ? OT LONG TERM GOAL #6  ? Title Pt will increase functional use of LUE by completing box and blocks with score of 18 blocks or greater.   ? Baseline 14 blocks LUE   ? Time 8   ? Period Weeks   ? Status New   ? ?  ?  ? ?  ? ? ? ? ? ?Junious Dresser, OT ?04/21/2021, 9:14 AM ? ?  ? ? ? ?

## 2021-04-24 NOTE — Progress Notes (Signed)
Erroneous encounter

## 2021-04-25 ENCOUNTER — Ambulatory Visit: Payer: BC Managed Care – PPO | Admitting: Occupational Therapy

## 2021-04-26 ENCOUNTER — Other Ambulatory Visit (HOSPITAL_COMMUNITY): Payer: Self-pay

## 2021-04-27 ENCOUNTER — Other Ambulatory Visit: Payer: Self-pay

## 2021-04-27 ENCOUNTER — Other Ambulatory Visit (HOSPITAL_COMMUNITY): Payer: Self-pay

## 2021-04-27 ENCOUNTER — Encounter: Payer: BC Managed Care – PPO | Admitting: Family

## 2021-04-27 DIAGNOSIS — I5042 Chronic combined systolic (congestive) and diastolic (congestive) heart failure: Secondary | ICD-10-CM | POA: Diagnosis not present

## 2021-04-27 DIAGNOSIS — I610 Nontraumatic intracerebral hemorrhage in hemisphere, subcortical: Secondary | ICD-10-CM | POA: Diagnosis not present

## 2021-04-27 DIAGNOSIS — I10 Essential (primary) hypertension: Secondary | ICD-10-CM

## 2021-04-27 DIAGNOSIS — R569 Unspecified convulsions: Secondary | ICD-10-CM | POA: Diagnosis not present

## 2021-04-28 ENCOUNTER — Ambulatory Visit: Payer: BC Managed Care – PPO | Admitting: Physical Therapy

## 2021-04-28 ENCOUNTER — Encounter: Payer: Self-pay | Admitting: Occupational Therapy

## 2021-04-28 ENCOUNTER — Ambulatory Visit: Payer: BC Managed Care – PPO | Attending: Family | Admitting: Occupational Therapy

## 2021-04-28 DIAGNOSIS — R2689 Other abnormalities of gait and mobility: Secondary | ICD-10-CM | POA: Insufficient documentation

## 2021-04-28 DIAGNOSIS — R2681 Unsteadiness on feet: Secondary | ICD-10-CM | POA: Insufficient documentation

## 2021-04-28 DIAGNOSIS — I69354 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-dominant side: Secondary | ICD-10-CM | POA: Diagnosis not present

## 2021-04-28 DIAGNOSIS — M6281 Muscle weakness (generalized): Secondary | ICD-10-CM | POA: Diagnosis not present

## 2021-04-28 NOTE — Therapy (Signed)
[facility] [address] ?[place], [place], [address] ?Phone: [phone]   Fax:  [phone] ? ?April 28, 2021  ? ? ?No Recipients ? ?Occupational Therapy Discharge Summary  ? ?Patient: [name] ?MRN: [id number] ?Date of Birth: October 09, 1965 ? ?Diagnosis: No diagnosis found. ? ?Referring Provider (OT): Leeroy Cha, MD ? ? ?The above patient had been seen in Occupational Therapy 12 times of 22 treatments scheduled with 3 no shows and 7 cancellations. ? ?The treatment consisted of LUE Neuromuscular Reeudcation, Grasp and release with LUE and normal movement patterns for LUE ?The patient is: Improved ? ?Subjective: Pt arrived late for scheduled session today and verbalized understanding of discharge plans at this time.  ? ?Discharge Findings: Unable to fully assess goals as patient was unable to be seen to conserve visits for remainder of therapy. Pt with some improvement with LUE NMR. ? ?Functional Status at Discharge: Continues to req supervision for ADLs and IADLS d/t safety concerns on OT recommendation. ? ?Goals Partially Met ? ? ? ?Sincerely, ? ?Zachery Conch, OT ? ?CC ?No Recipients ? ?[facility] [address] ?[place], [place], [address] ?Phone: [phone]   Fax:  [phone] ? ?Patient: [name] ?MRN: [id number] ?Date of Birth: 05-27-1965 ?

## 2021-04-29 ENCOUNTER — Encounter: Payer: Self-pay | Admitting: Physical Therapy

## 2021-04-29 ENCOUNTER — Other Ambulatory Visit (HOSPITAL_COMMUNITY): Payer: Self-pay

## 2021-04-29 DIAGNOSIS — R569 Unspecified convulsions: Secondary | ICD-10-CM | POA: Diagnosis not present

## 2021-04-29 DIAGNOSIS — I5042 Chronic combined systolic (congestive) and diastolic (congestive) heart failure: Secondary | ICD-10-CM | POA: Diagnosis not present

## 2021-04-29 DIAGNOSIS — I610 Nontraumatic intracerebral hemorrhage in hemisphere, subcortical: Secondary | ICD-10-CM | POA: Diagnosis not present

## 2021-04-29 NOTE — Therapy (Signed)
[facility] ?[address] ?[place], [place], [address] ?Phone: [phone]   Fax:  [phone] ? ?Physical Therapy Treatment ? ?Patient Details  ?Name: [name] ?MRN: [id number] ?Date of Birth: 07-25-1965 ?Referring Provider (PT): Leeroy Cha, MD ? ? ?Encounter Date: 04/28/2021 ? ? PT End of Session - 04/29/21 1222   ? ? Visit Number 15   ? Number of Visits 19   1st 5 visits are at no charge; 30 PT/OT combined  ? Date for PT Re-Evaluation 05/20/21   ? Authorization Type [employer]. 30 visits PT/OT combined, each dicipline counts as 1 visit   ? Authorization - Visit Number 20   18 visits used for PT and OT visits since 01-23-21  ? Authorization - Number of Visits 30   ? PT Start Time 1405   ? PT Stop Time 1450   ? PT Time Calculation (min) 45 min   ? Equipment Utilized During Treatment Gait belt   ? Activity Tolerance Patient tolerated treatment well   ? Behavior During Therapy [facility] for tasks assessed/performed   ? ?  ?  ? ?  ? ? ?Past Medical History:  ?Diagnosis Date  ? Anxiety   ? Cataract   ? removed left eye  ? Chronic combined systolic and diastolic CHF (congestive heart failure) ([place])   ? EF 25-30%  ? Diastolic dysfunction   ? Dyslipidemia   ? Hyperlipidemia   ? Hypertension   ? Neuromuscular disorder ([place])   ? neuropathy left foot  ? Nonischemic dilated cardiomyopathy ([place])   ? felt secondary to HTN with no ischemia on nuclear stress test 05/2012, EF 25-30%  ? Seizures ([place])   ? last seizure 05-2020- on Keppra  ? Shortness of breath   ? Stroke [facility]) 05/2020  ? ? ?Past Surgical History:  ?Procedure Laterality Date  ? COLONOSCOPY WITH PROPOFOL N/A 12/09/2020  ? Procedure: COLONOSCOPY WITH PROPOFOL;  Surgeon: Yetta Flock, MD;  [facility];  Service: Gastroenterology;  Laterality: N/A;  ? KNEE ARTHROSCOPY Left 04/13/2016  ? Procedure: ARTHROSCOPY KNEE WITH DEBRIDEMENT;  Surgeon: Garald Balding, MD;  Location: [facility];  Service: Orthopedics;  Laterality: Left;  ? KNEE ARTHROSCOPY WITH MEDIAL MENISECTOMY Right 04/13/2016  ? Procedure: KNEE ARTHROSCOPY WITH MEDIAL MENISECTOMY;  Surgeon: Garald Balding, MD;  Location: [facility];  Service: Orthopedics;  Laterality: LEFT not right knee  ? NASAL FRACTURE SURGERY    ? SHOULDER SURGERY Left   ? ? ?There were no vitals filed for this visit. ? ? Subjective Assessment - 04/28/21 1413   ? ? Subjective Pt reports he missed pool appt on Monday this week due to accidentally taking 2 doses of his medication (says wife had laid out his evening meds as she was going to be working overtime and he did not realize it - thought he was taking am meds only); called MD who instructed him in what to do.  Pt states he is still feeling a little groggy from the overdose.  Says OT just discharged him - says he has 18 visits left so he will concentrate on PT only.  Pt also reports the neuropathy has now progressed to above his left knee - "is getting worse"   ? Pertinent History HTN, cardiomyopathy, chronic combined systolic & diastolic CHF, alcohol use disorder, seizures   ? Currently in Pain? Yes   ? Pain Score 7    ? Pain Location Leg   ?  Pain Orientation Left   ? Pain Descriptors / Indicators Burning   ? Pain Type Neuropathic pain   ? Pain Onset More than a month ago   ? Pain Frequency Constant   ? Aggravating Factors  no specific   ? Pain Relieving Factors "medication helps some but not much"   ? ?  ?  ? ?  ? ? ? ? ? ? ? ? ? ? ? ? ? ? ? ? ? ? ? ? [facility] Adult PT Treatment/Exercise - 04/29/21 0001   ? ?  ? Transfers  ? Transfers Sit to Stand;Stand to Sit   ? Sit to Stand 4: Min guard;4: Min assist   with Rt foot on balance bubble for increased LLE weight bearing & strengthening  ? Stand to Sit 5: Supervision   ? Comments Rt foot on balance bubble for increased LLE weight bearing and strengthening   ?  ? Ambulation/Gait  ? Ambulation/Gait Yes   ? Ambulation/Gait Assistance 4: Min  guard   ? Ambulation/Gait Assistance Details unable to don AFO due to edema in LLE   ? Ambulation Distance (Feet) 230 Feet   ? Assistive device None   ? Gait Pattern Decreased step length - left;Decreased arm swing - left;Decreased stance time - left;Decreased hip/knee flexion - left;Decreased dorsiflexion - left;Decreased weight shift to left   ? Ambulation Surface Level;Indoor   ? Stairs Yes   ? Stairs Assistance 4: Min guard   ? Stair Management Technique One rail Right;Step to pattern;Alternating pattern;Forwards   pt ascended with LLE first on 1st rep; then alternating pattern for 2nd 2 reps with Rt hand rail  ? Number of Stairs 4   3 reps = 12 steps total  ?  ? Neuro Re-ed   ? Neuro Re-ed Details  Tap ups to 1st step with RLE for improved LLE SLS with use of Rt hand rail as needed with CGA for safety   ?  ? Knee/Hip Exercises: Stretches  ? Active Hamstring Stretch Left;2 reps;20 seconds   Runner's stretch  ?  ? Knee/Hip Exercises: Aerobic  ? Recumbent Bike SciFit level 3.0 10" with UE's and LE's   ? ?  ?  ? ?  ? ? ? ? ? ? ? ? ? ? ? ? PT Short Term Goals - 04/29/21 1222   ? ?  ? PT SHORT TERM GOAL #1  ? Title Pt will improve Berg balance test score by at least 4 points to demo improvement in balance.  Increase Berg score to >/= 49/56 (Target date for all STG's 03-11-21)   ? Baseline 01/06/21: initial score set this date is 40/56;  46/56 on 02-10-21   ? Time 4   ? Period Weeks   ? Status New   ? Target Date 03/11/21   ?  ? PT SHORT TERM GOAL #2  ? Title Improve TUG score from 38.87 secs to </= 33 secs with use of hemiwalker to demo improvement in functional mobility.; Upgraded STG - TUG score </= 26 secs with hemiwalker   ? Baseline 01/06/21: unable to reassess due to time contraints, 38.87 sec's at baseline;  29.53 secs on 02-10-21 with hemiwalker   ? Time 4   ? Period Weeks   ? Status New   ? Target Date 03/11/21   ?  ? PT SHORT TERM GOAL #3  ? Title Amb. with SBQC 230' with CGA on flat, even surface for  increased community accessibility.;  Amb.  58' with SBQC with SBA   ? Baseline 350' with SBQC on 02-10-21   ? Time 4   ? Period Weeks   ? Status New   ? Target Date 03/11/21   ?  ? PT SHORT TERM GOAL #4  ? Title Assess foot up brace vs. AFO for LLE for safety with amb.   ? Time 4   ? Period Weeks   ? Status New   ? Target Date 03/11/21   ? ?  ?  ? ?  ? ? ? ? PT Long Term Goals - 04/29/21 1222   ? ?  ? PT LONG TERM GOAL #1  ? Title Improve Berg balance test score by at least 8 points for reduced fall risk.  Increase Berg score to >/= 52/56 for reduced fall risk.   ? Baseline improved 6 points from 40/56 on 01-06-21 to 46/56 on 02-10-21;  49/56 on 04-07-21   ? Time 6   ? Period Weeks   ? Status On-going   ? Target Date 05/20/21   ?  ? PT LONG TERM GOAL #2  ? Title Increase gait velocity from .68 ft/sec to >/= 1.4 ft/sec with hemiwalker for increased gait efficiency.  Upgraded LTG - increase gait speed to >/= 1.6 ft/sec with SBQC   ? Baseline 47.78 secs = .68 ft/sec;     02-10-21:   26.78 secs = 1.22 ft/sec with hemiwalker;  26.06 secs with SBQC = 1.26 ft/sec   ? Time 6   ? Period Weeks   ? Status On-going   ? Target Date 05/20/21   ?  ? PT LONG TERM GOAL #3  ? Title Improve TUG score to </= 30 secs with hemiwalker to reduce fall risk and to demo incr. functional mobility. Upgraded LTG - score </=24 secs with SBQC   ? Baseline 38.87 secs with hemiwalker;   02-10-21--  29.53 secs with hemiwalker; 04-07-21 =  24.47 secs with SBQC   ? Time 6   ? Period Weeks   ? Status Partially Met   ? Target Date 05/20/21   ?  ? PT LONG TERM GOAL #4  ? Title Amb. 30' without use of assistive device with SBA for household amb. Upgraded goal - modified independent household amb. without device   ? Baseline hemiwalker - 34' with SBA to CGA;  30' with CGA as Lt foot does not clear floor occasionally   ? Time 6   ? Period Weeks   ? Status On-going   ? Target Date 05/20/21   ?  ? PT LONG TERM GOAL #5  ? Title Amb. 500' with SPC with SBA for incr.  community accessibility. Upgraded LTG - amb. 550' with SBQC on flat, even surface with SBA   ? Baseline 350' with SBQC on 02-10-21   ? Time 6   ? Period Weeks   ? Status On-going   ? Target Date 05/20/21   ?  ? PT LONG

## 2021-04-30 NOTE — Progress Notes (Deleted)
? ? ?Patient ID: [name], male    DOB: [birth year]  MRN: [id number] ? ?CC: Hypertension Follow-Up ? ?Subjective: ?[name] is a 56 y.o. male who presents for hypertension follow-up.  ? ?His concerns today include:  ?HYPERTENSION FOLLOW-UP: ?01/25/2021: ?- Continue Amlodipine, Clonidine, and Losartan as prescribed.  ? ?05/03/2021: ? ?Patient Active Problem List  ? Diagnosis Date Noted  ? Colon cancer screening   ? Left hemiplegia ([place]) 07/02/2020  ? 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 Dysphagia, post-stroke   ? Vascular headache   ? Acute blood loss anemia   ? AKI (acute kidney injury) ([place])   ? Essential hypertension   ? Seizures ([place])   ? 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 ICH (intracerebral hemorrhage) ([place]) 06/09/2020  ? Sprain of anterior talofibular ligament of left ankle 01/15/2020  ? Hyperlipidemia, on Lipitor 04/01/2017  ? Alcohol use disorder, mild, abuse 06/30/2016  ? Morbid obesity ([place]) 06/30/2016  ? Nonischemic dilated cardiomyopathy ([place]) 11/08/2012  ? Chronic combined systolic and diastolic CHF (congestive heart failure) ([place]) 11/08/2012  ? HTN (hypertension) 05/25/2012  ?  ? ?Current Outpatient Medications on File Prior to Visit  ?Medication Sig Dispense Refill  ? amLODipine (NORVASC) 10 MG tablet Take 1 tablet (10 mg total) by mouth daily. 90 tablet 0  ? cloNIDine (CATAPRES) 0.1 MG tablet Take 1 tablet (0.1 mg total) by mouth 3 (three) times daily. 90 tablet 0  ? diclofenac Sodium (VOLTAREN) 1 % GEL Apply 2 g topically 4 (four) times daily. [id number] g 0  ? folic acid (FOLVITE) 1 MG tablet Take 1 tablet (1 mg total) by mouth daily. 90 tablet 3  ? gabapentin (NEURONTIN) 300 MG capsule Take 1 capsule (300 mg total) by mouth 3 (three) times daily. 90 capsule 11  ? labetalol (NORMODYNE) 100 MG tablet Take 1 tablet by mouth twice daily 180 tablet 0  ? levETIRAcetam (KEPPRA) 500 MG tablet Take 1 tablet (500 mg total) by mouth 2 (two) times daily. 180 tablet 3  ? losartan (COZAAR) 50 MG  tablet Take 1 tablet (50 mg total) by mouth 2 (two) times daily. 180 tablet 0  ? Multiple Vitamins-Minerals (CERTAVITE/ANTIOXIDANTS) TABS Take 1 tablet by mouth daily. 30 tablet 0  ? tamsulosin (FLOMAX) 0.4 MG CAPS capsule TAKE 1 CAPSULE BY MOUTH ONCE DAILY AFTER SUPPER 30 capsule 0  ? thiamine 100 MG tablet Take 1 tablet (100 mg total) by mouth daily. 30 tablet 0  ? topiramate (TOPAMAX) 50 MG tablet Take 1 tablet (50 mg total) by mouth 2 (two) times daily. 60 tablet 5  ? ?No current facility-administered medications on file prior to visit.  ? ? ?No Known Allergies ? ?Social History  ? ?Socioeconomic History  ? Marital status: Married  ?  Spouse name: Not on file  ? Number of children: Not on file  ? Years of education: Not on file  ? Highest education level: Not on file  ?Occupational History  ? Not on file  ?Tobacco Use  ? Smoking status: Never  ? Smokeless tobacco: Never  ?Vaping Use  ? Vaping Use: Never used  ?Substance and Sexual Activity  ? Alcohol use: Not Currently  ? Drug use: No  ? Sexual activity: Yes  ?Other Topics Concern  ? Not on file  ?Social History Narrative  ? Not on file  ? ?Social Determinants of Health  ? ?Financial Resource Strain: Not on file  ?Food Insecurity: Not on file  ?Transportation Needs: Not  on file  ?Physical Activity: Not on file  ?Stress: Not on file  ?Social Connections: Not on file  ?Intimate Partner Violence: Not on file  ? ? ?Family History  ?Problem Relation Age of Onset  ? Hypertension Mother   ?     Passed away when she is 45 secondary to hip surgery complications  ? CAD Mother   ? Heart attack Mother   ? Hypertension Father   ? Diabetes Brother   ? Stroke Brother   ? Heart attack Maternal Grandmother   ? Colon cancer Neg Hx   ? Esophageal cancer Neg Hx   ? Pancreatic cancer Neg Hx   ? Stomach cancer Neg Hx   ? Colon polyps Neg Hx   ? ? ?Past Surgical History:  ?Procedure Laterality Date  ? COLONOSCOPY WITH PROPOFOL N/A 12/09/2020  ? Procedure: COLONOSCOPY WITH PROPOFOL;   Surgeon: Yetta Flock, MD;  Location: [facility];  Service: Gastroenterology;  Laterality: N/A;  ? KNEE ARTHROSCOPY Left 04/13/2016  ? Procedure: ARTHROSCOPY KNEE WITH DEBRIDEMENT;  Surgeon: Garald Balding, MD;  Location: [facility];  Service: Orthopedics;  Laterality: Left;  ? KNEE ARTHROSCOPY WITH MEDIAL MENISECTOMY Right 04/13/2016  ? Procedure: KNEE ARTHROSCOPY WITH MEDIAL MENISECTOMY;  Surgeon: Garald Balding, MD;  Location: [facility];  Service: Orthopedics;  Laterality: LEFT not right knee  ? NASAL FRACTURE SURGERY    ? SHOULDER SURGERY Left   ? ? ?ROS: ?Review of Systems ?Negative except as stated above ? ?PHYSICAL EXAM: ?There were no vitals taken for this visit. ? ?Physical Exam ? ?{male adult master:310786} ?{male adult master:310785} ? ? ?  Latest Ref Rng & Units 09/29/2020  ?  4:45 PM 07/19/2020  ?  5:30 AM 07/12/2020  ?  6:16 AM  ?CMP  ?Glucose 65 - 99 mg/dL 88   109   97    ?BUN 6 - 24 mg/dL 14   14   16     ?Creatinine 0.76 - 1.27 mg/dL 1.05   1.12   1.24    ?Sodium 134 - 144 mmol/L 143   138   140    ?Potassium 3.5 - 5.2 mmol/L 3.9   3.8   3.9    ?Chloride 96 - 106 mmol/L 107   108   105    ?CO2 20 - 29 mmol/L 20   22   26     ?Calcium 8.7 - 10.2 mg/dL 10.3   9.4   9.7    ?Total Protein 6.0 - 8.5 g/dL 7.7      ?Total Bilirubin 0.0 - 1.2 mg/dL 0.4      ?Alkaline Phos 44 - 121 IU/L 105      ?AST 0 - 40 IU/L 18      ?ALT 0 - 44 IU/L 22      ? ?Lipid Panel  ?   ?Component Value Date/Time  ? CHOL 154 09/29/2020 1645  ? TRIG 126 09/29/2020 1645  ? HDL 45 09/29/2020 1645  ? CHOLHDL 3.4 09/29/2020 1645  ? CHOLHDL 3.8 06/09/2020 1809  ? VLDL 66 (H) 06/09/2020 1809  ? [place] 87 09/29/2020 1645  ? ? ?CBC ?   ?Component Value Date/Time  ? WBC 5.7 09/29/2020 1645  ? WBC 6.0 07/19/2020 0530  ? RBC 4.39 09/29/2020 1645  ? RBC 3.61 (L) 07/19/2020 0530  ? HGB 13.4 09/29/2020 1645  ? HCT 38.0 09/29/2020 1645  ? PLT 326 09/29/2020 1645  ? MCV 87  09/29/2020 1645  ? MCH 30.5  09/29/2020 1645  ? MCH 31.6 07/19/2020 0530  ? MCHC 35.3 09/29/2020 1645  ? MCHC 33.4 07/19/2020 0530  ? RDW 11.6 09/29/2020 1645  ? LYMPHSABS 1.8 06/28/2020 1103  ? MONOABS 0.8 06/28/2020 1103  ? EOSABS 0.1 06/28/2020 1103  ? BASOSABS 0.0 06/28/2020 1103  ? ? ?ASSESSMENT AND PLAN: ? ?There are no diagnoses linked to this encounter. ? ? ?Patient was given the opportunity to ask questions.  Patient verbalized understanding of the plan and was able to repeat key elements of the plan. Patient was given clear instructions to go to Emergency Department or return to medical center if symptoms don't improve, worsen, or new problems develop.The patient verbalized understanding. ? ? ?No orders of the defined types were placed in this encounter. ? ? ? ?Requested Prescriptions  ? ? No prescriptions requested or ordered in this encounter  ? ? ?No follow-ups on file. ? ?Camillia Herter, NP  ?

## 2021-05-03 ENCOUNTER — Ambulatory Visit: Payer: BC Managed Care – PPO | Admitting: Family

## 2021-05-03 DIAGNOSIS — I10 Essential (primary) hypertension: Secondary | ICD-10-CM

## 2021-05-04 DIAGNOSIS — M25512 Pain in left shoulder: Secondary | ICD-10-CM | POA: Diagnosis not present

## 2021-05-04 DIAGNOSIS — M19012 Primary osteoarthritis, left shoulder: Secondary | ICD-10-CM | POA: Diagnosis not present

## 2021-05-05 NOTE — Progress Notes (Signed)
? ? ?Patient ID: [name], male    DOB: [birth year]  MRN: [id number] ? ?CC: Hypertension Follow-Up ? ?Subjective: ?[name] is a 56 y.o. male who presents for hypertension follow-up. Wife [name] participated via telephone during today's appointment.  ? ?His concerns today include:  ?HYPERTENSION FOLLOW-UP: ?01/25/2021: ?- Continue Amlodipine, Clonidine, and Losartan as prescribed.  ? ?05/11/2021: ?Doing well on current regimen. No side effects. No issues/concerns. Denies chest pain and shortness of breath.  ? ?2. NECK CONCERN: ?Reports left posterior neck with nodule present for several months. Endorses discomfort. Denies pain and additional red flag symptoms. Prefers to watchful wait and plans to follow-up with primary care as scheduled. ? ?Patient Active Problem List  ? Diagnosis Date Noted  ? Colon cancer screening   ? Left hemiplegia ([facility]) 07/02/2020  ? 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 Dysphagia, post-stroke   ? Vascular headache   ? Acute blood loss anemia   ? AKI (acute kidney injury) ([facility])   ? Essential hypertension   ? Seizures ([facility])   ? 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 ICH (intracerebral hemorrhage) ([facility]) 06/09/2020  ? Sprain of anterior talofibular ligament of left ankle 01/15/2020  ? Hyperlipidemia, on Lipitor 04/01/2017  ? Alcohol use disorder, mild, abuse 06/30/2016  ? Morbid obesity ([facility]) 06/30/2016  ? 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  ? Chronic combined systolic and diastolic CHF (congestive heart failure) ([facility]) 11/08/2012  ? HTN (hypertension) 05/25/2012  ?  ? ?Current Outpatient Medications on File Prior to Visit  ?Medication Sig Dispense Refill  ? diclofenac Sodium (VOLTAREN) 1 % GEL Apply 2 g topically 4 (four) times daily. 200 g 0  ? folic acid (FOLVITE) 1 MG tablet Take 1 tablet (1 mg total) by mouth daily. 90 tablet 3  ? gabapentin (NEURONTIN) 300 MG capsule Take 1 capsule (300 mg total) by mouth 3 (three) times daily. 90 capsule 11  ? labetalol  (NORMODYNE) 100 MG tablet Take 1 tablet by mouth twice daily 180 tablet 0  ? levETIRAcetam (KEPPRA) 500 MG tablet Take 1 tablet (500 mg total) by mouth 2 (two) times daily. 180 tablet 3  ? Multiple Vitamins-Minerals (CERTAVITE/ANTIOXIDANTS) TABS Take 1 tablet by mouth daily. 30 tablet 0  ? tamsulosin (FLOMAX) 0.4 MG CAPS capsule TAKE 1 CAPSULE BY MOUTH ONCE DAILY AFTER SUPPER 30 capsule 0  ? thiamine 100 MG tablet Take 1 tablet (100 mg total) by mouth daily. 30 tablet 0  ? topiramate (TOPAMAX) 50 MG tablet Take 1 tablet (50 mg total) by mouth 2 (two) times daily. 60 tablet 5  ? ?No current facility-administered medications on file prior to visit.  ? ? ?No Known Allergies ? ?Social History  ? ?Socioeconomic History  ? Marital status: Married  ?  Spouse name: Not on file  ? Number of children: Not on file  ? Years of education: Not on file  ? Highest education level: Not on file  ?Occupational History  ? Not on file  ?Tobacco Use  ? Smoking status: Former  ?  Types: Cigarettes  ?  Passive exposure: Past  ? Smokeless tobacco: Never  ?Vaping Use  ? Vaping Use: Never used  ?Substance and Sexual Activity  ? Alcohol use: Not Currently  ? Drug use: Not Currently  ?  Types: Cocaine, Marijuana, Oxycodone  ? Sexual activity: Yes  ?Other Topics Concern  ? Not on file  ?Social History Narrative  ? Not on file  ? ?Social Determinants of Health  ? ?  Financial Resource Strain: Not on file  ?Food Insecurity: Not on file  ?Transportation Needs: Not on file  ?Physical Activity: Not on file  ?Stress: Not on file  ?Social Connections: Not on file  ?Intimate Partner Violence: Not on file  ? ? ?Family History  ?Problem Relation Age of Onset  ? Hypertension Mother   ?     Passed away when she is 108 secondary to hip surgery complications  ? CAD Mother   ? Heart attack Mother   ? Hypertension Father   ? Diabetes Brother   ? Stroke Brother   ? Heart attack Maternal Grandmother   ? Colon cancer Neg Hx   ? Esophageal cancer Neg Hx   ? Pancreatic  cancer Neg Hx   ? Stomach cancer Neg Hx   ? Colon polyps Neg Hx   ? ? ?Past Surgical History:  ?Procedure Laterality Date  ? COLONOSCOPY WITH PROPOFOL N/A 12/09/2020  ? Procedure: COLONOSCOPY WITH PROPOFOL;  Surgeon: Benancio Deeds, MD;  [facility];  Service: Gastroenterology;  Laterality: N/A;  ? KNEE ARTHROSCOPY Left 04/13/2016  ? Procedure: ARTHROSCOPY KNEE WITH DEBRIDEMENT;  Surgeon: Valeria Batman, MD;  Location: [facility];  Service: Orthopedics;  Laterality: Left;  ? KNEE ARTHROSCOPY WITH MEDIAL MENISECTOMY Right 04/13/2016  ? Procedure: KNEE ARTHROSCOPY WITH MEDIAL MENISECTOMY;  Surgeon: Valeria Batman, MD;  Location: [facility];  Service: Orthopedics;  Laterality: LEFT not right knee  ? NASAL FRACTURE SURGERY    ? SHOULDER SURGERY Left   ? ? ?ROS: ?Review of Systems ?Negative except as stated above ? ?PHYSICAL EXAM: ?BP 116/75 (BP Location: Right Arm, Patient Position: Sitting, Cuff Size: Large)   Pulse 75   Temp 98.5 ?F (36.9 ?C)   Resp 18   Ht 5' 9.29" (1.76 m)   Wt 206 lb (93.4 kg)   SpO2 96%   BMI 30.17 kg/m?  ? ?Physical Exam ?HENT:  ?   Head: Normocephalic and atraumatic.  ?Eyes:  ?   Extraocular Movements: Extraocular movements intact.  ?   Conjunctiva/sclera: Conjunctivae normal.  ?   Pupils: Pupils are equal, round, and reactive to light.  ?Neck:  ?   Comments: Soft movable nodule left posterior neck. No evidence of drainage or erythema.  ?Cardiovascular:  ?   Rate and Rhythm: Normal rate and regular rhythm.  ?   Pulses: Normal pulses.  ?   Heart sounds: Normal heart sounds.  ?Pulmonary:  ?   Effort: Pulmonary effort is normal.  ?   Breath sounds: Normal breath sounds.  ?Musculoskeletal:  ?   Cervical back: Normal range of motion and neck supple.  ?Neurological:  ?   General: No focal deficit present.  ?   Mental Status: He is alert and oriented to person, place, and time.  ?Psychiatric:     ?   Mood and Affect: Mood normal.     ?    Behavior: Behavior normal.  ? ?ASSESSMENT AND PLAN: ?1. Essential (primary) hypertension: ?- Continue Amlodipine, Clonidine, and Losartan as prescribed.  ?- Counseled on blood pressure goal of less than 130/80, low-sodium, DASH diet, medication compliance, and 150 minutes of moderate intensity exercise per week as tolerated. Counseled on medication adherence and adverse effects. ?- Update BMP.  ?- Follow-up with primary provider in 4 months or sooner if needed.  ?- Basic Metabolic Panel ?- amLODipine (NORVASC) 10 MG tablet; Take 1 tablet (10 mg total) by mouth daily.  Dispense: 120  tablet; Refill: 0 ?- cloNIDine (CATAPRES) 0.1 MG tablet; Take 1 tablet (0.1 mg total) by mouth 3 (three) times daily.  Dispense: 360 tablet; Refill: 0 ?- losartan (COZAAR) 50 MG tablet; Take 1 tablet (50 mg total) by mouth 2 (two) times daily.  Dispense: 240 tablet; Refill: 0 ? ?2. Neck discomfort: ?- Patient elects to watchful monitor at home.  ?- Encouraged to follow-up with primary provider as scheduled. ? ? ?Patient was given the opportunity to ask questions.  Patient verbalized understanding of the plan and was able to repeat key elements of the plan. Patient was given clear instructions to go to Emergency Department or return to medical center if symptoms don't improve, worsen, or new problems develop.The patient verbalized understanding. ? ? ?Orders Placed This Encounter  ?Procedures  ? Basic Metabolic Panel  ? ? ?Requested Prescriptions  ? ?Signed Prescriptions Disp Refills  ? amLODipine (NORVASC) 10 MG tablet 120 tablet 0  ?  Sig: Take 1 tablet (10 mg total) by mouth daily.  ? cloNIDine (CATAPRES) 0.1 MG tablet 360 tablet 0  ?  Sig: Take 1 tablet (0.1 mg total) by mouth 3 (three) times daily.  ? losartan (COZAAR) 50 MG tablet 240 tablet 0  ?  Sig: Take 1 tablet (50 mg total) by mouth 2 (two) times daily.  ? ? ?Return in about 4 months (around 09/10/2021) for Follow-Up or next available hypertension . ? ?Rema Fendt, NP  ?

## 2021-05-06 ENCOUNTER — Ambulatory Visit: Payer: BC Managed Care – PPO | Admitting: Surgical

## 2021-05-11 ENCOUNTER — Other Ambulatory Visit: Payer: Self-pay | Admitting: Physical Medicine and Rehabilitation

## 2021-05-11 ENCOUNTER — Other Ambulatory Visit: Payer: Self-pay | Admitting: Family

## 2021-05-11 ENCOUNTER — Ambulatory Visit: Payer: BC Managed Care – PPO | Admitting: Family

## 2021-05-11 ENCOUNTER — Encounter: Payer: Self-pay | Admitting: Family

## 2021-05-11 VITALS — BP 116/75 | HR 75 | Temp 98.5°F | Resp 18 | Ht 69.29 in | Wt 206.0 lb

## 2021-05-11 DIAGNOSIS — I10 Essential (primary) hypertension: Secondary | ICD-10-CM | POA: Diagnosis not present

## 2021-05-11 DIAGNOSIS — M542 Cervicalgia: Secondary | ICD-10-CM | POA: Diagnosis not present

## 2021-05-11 DIAGNOSIS — R569 Unspecified convulsions: Secondary | ICD-10-CM

## 2021-05-11 DIAGNOSIS — G441 Vascular headache, not elsewhere classified: Secondary | ICD-10-CM

## 2021-05-11 MED ORDER — CLONIDINE HCL 0.1 MG PO TABS: 0.1000 mg | ORAL_TABLET | Freq: Three times a day (TID) | ORAL | 0 refills | Status: DC

## 2021-05-11 MED ORDER — AMLODIPINE BESYLATE 10 MG PO TABS: 10.0000 mg | ORAL_TABLET | Freq: Every day | ORAL | 0 refills | Status: DC

## 2021-05-11 MED ORDER — LOSARTAN POTASSIUM 50 MG PO TABS: 50.0000 mg | ORAL_TABLET | Freq: Two times a day (BID) | ORAL | 0 refills | Status: DC

## 2021-05-11 NOTE — Telephone Encounter (Signed)
Requested medication (s) are due for refill today: yes ? ?Requested medication (s) are on the active medication list: no ? ?Last refill:  02/11/21 ? ?Future visit scheduled: yes ? ?Notes to clinic:  rx was dc'd on 03/23/21 by another provider. Please advise  ? ? ?  ?Requested Prescriptions  ?Pending Prescriptions Disp Refills  ? topiramate (TOPAMAX) 25 MG tablet [Pharmacy Med Name: Topiramate 25 MG Oral Tablet] 180 tablet 0  ?  Sig: Take 2 tablets by mouth once daily  ?  ? Neurology: Anticonvulsants - topiramate & zonisamide Passed - 05/11/2021  5:10 PM  ?  ?  Passed - Cr in normal range and within 360 days  ?  Creatinine, Ser  ?Date Value Ref Range Status  ?09/29/2020 1.05 0.76 - 1.27 mg/dL Final  ?  ?  ?  ?  Passed - CO2 in normal range and within 360 days  ?  CO2  ?Date Value Ref Range Status  ?09/29/2020 20 20 - 29 mmol/L Final  ? ?Bicarbonate  ?Date Value Ref Range Status  ?06/18/2020 23.2 20.0 - 28.0 mmol/L Final  ?  ?  ?  ?  Passed - ALT in normal range and within 360 days  ?  ALT  ?Date Value Ref Range Status  ?09/29/2020 22 0 - 44 IU/L Final  ?  ?  ?  ?  Passed - AST in normal range and within 360 days  ?  AST  ?Date Value Ref Range Status  ?09/29/2020 18 0 - 40 IU/L Final  ?  ?  ?  ?  Passed - Completed PHQ-2 or PHQ-9 in the last 360 days  ?  ?  Passed - Valid encounter within last 12 months  ?  Recent Outpatient Visits   ? ?      ? Today Essential (primary) hypertension  ? Primary Care at [facility], Amy J, NP  ? 3 months ago Encounter for completion of form with patient  ? Primary Care at [facility], [place], NP  ? 7 months ago Annual physical exam  ? Primary Care at [facility], Amy J, NP  ? 9 months ago Encounter to establish care  ? Primary Care at [facility], [place], NP  ? ?  ?  ?Future Appointments   ? ?        ? In 4 months Rema Fendt, NP Primary Care at [facility]  ? ?  ? ? ?  ?  ?  ?Signed Prescriptions Disp Refills  ? labetalol (NORMODYNE) 100  MG tablet 180 tablet 0  ?  Sig: Take 1 tablet by mouth twice daily  ?  ? Cardiovascular:  Beta Blockers Passed - 05/11/2021  5:10 PM  ?  ?  Passed - Last BP in normal range  ?  BP Readings from Last 1 Encounters:  ?05/11/21 116/75  ?  ?  ?  ?  Passed - Last Heart Rate in normal range  ?  Pulse Readings from Last 1 Encounters:  ?05/11/21 75  ?  ?  ?  ?  Passed - Valid encounter within last 6 months  ?  Recent Outpatient Visits   ? ?      ? Today Essential (primary) hypertension  ? Primary Care at [facility], Amy J, NP  ? 3 months ago Encounter for completion of form with patient  ? Primary Care at [facility], Salomon Fick, NP  ?  7 months ago Annual physical exam  ? Primary Care at [facility], [place] J, NP  ? 9 months ago Encounter to establish care  ? Primary Care at [facility], [place], NP  ? ?  ?  ?Future Appointments   ? ?        ? In 4 months Rema Fendt, NP Primary Care at [facility]  ? ?  ? ? ?  ?  ?  ? ?

## 2021-05-11 NOTE — Telephone Encounter (Signed)
Requested Prescriptions  ?Pending Prescriptions Disp Refills  ?? topiramate (TOPAMAX) 25 MG tablet [Pharmacy Med Name: Topiramate 25 MG Oral Tablet] 180 tablet 0  ?  Sig: Take 2 tablets by mouth once daily  ?  ? Neurology: Anticonvulsants - topiramate & zonisamide Passed - 05/11/2021  5:10 PM  ?  ?  Passed - Cr in normal range and within 360 days  ?  Creatinine, Ser  ?Date Value Ref Range Status  ?09/29/2020 1.05 0.76 - 1.27 mg/dL Final  ?   ?  ?  Passed - CO2 in normal range and within 360 days  ?  CO2  ?Date Value Ref Range Status  ?09/29/2020 20 20 - 29 mmol/L Final  ? ?Bicarbonate  ?Date Value Ref Range Status  ?06/18/2020 23.2 20.0 - 28.0 mmol/L Final  ?   ?  ?  Passed - ALT in normal range and within 360 days  ?  ALT  ?Date Value Ref Range Status  ?09/29/2020 22 0 - 44 IU/L Final  ?   ?  ?  Passed - AST in normal range and within 360 days  ?  AST  ?Date Value Ref Range Status  ?09/29/2020 18 0 - 40 IU/L Final  ?   ?  ?  Passed - Completed PHQ-2 or PHQ-9 in the last 360 days  ?  ?  Passed - Valid encounter within last 12 months  ?  Recent Outpatient Visits   ?      ? Today Essential (primary) hypertension  ? Primary Care at [facility], Amy J, NP  ? 3 months ago Encounter for completion of form with patient  ? Primary Care at [facility], [place], NP  ? 7 months ago Annual physical exam  ? Primary Care at [facility], Amy J, NP  ? 9 months ago Encounter to establish care  ? Primary Care at [facility], [place], NP  ?  ?  ?Future Appointments   ?        ? In 4 months Camillia Herter, NP Primary Care at [facility]  ?  ? ?  ?  ?  ?? labetalol (NORMODYNE) 100 MG tablet [Pharmacy Med Name: Labetalol HCl 100 MG Oral Tablet] 180 tablet 0  ?  Sig: Take 1 tablet by mouth twice daily  ?  ? Cardiovascular:  Beta Blockers Passed - 05/11/2021  5:10 PM  ?  ?  Passed - Last BP in normal range  ?  BP Readings from Last 1 Encounters:  ?05/11/21 116/75  ?   ?  ?  Passed - Last Heart  Rate in normal range  ?  Pulse Readings from Last 1 Encounters:  ?05/11/21 75  ?   ?  ?  Passed - Valid encounter within last 6 months  ?  Recent Outpatient Visits   ?      ? Today Essential (primary) hypertension  ? Primary Care at [facility], Amy J, NP  ? 3 months ago Encounter for completion of form with patient  ? Primary Care at [facility], [place], NP  ? 7 months ago Annual physical exam  ? Primary Care at [facility], Amy J, NP  ? 9 months ago Encounter to establish care  ? Primary Care at [facility], [place], NP  ?  ?  ?Future Appointments   ?        ? In 4 months  Camillia Herter, NP Primary Care at [facility]  ?  ? ?  ?  ?  ? ? ?

## 2021-05-11 NOTE — Progress Notes (Signed)
Pt presents for hypertension follow-up,  ?Pt complains of left side neck pain  ?

## 2021-05-12 ENCOUNTER — Other Ambulatory Visit (HOSPITAL_COMMUNITY): Payer: Self-pay

## 2021-05-12 ENCOUNTER — Ambulatory Visit: Payer: BC Managed Care – PPO | Admitting: Physical Therapy

## 2021-05-12 DIAGNOSIS — R2689 Other abnormalities of gait and mobility: Secondary | ICD-10-CM | POA: Diagnosis not present

## 2021-05-12 DIAGNOSIS — M6281 Muscle weakness (generalized): Secondary | ICD-10-CM | POA: Diagnosis not present

## 2021-05-12 DIAGNOSIS — I69354 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-dominant side: Secondary | ICD-10-CM

## 2021-05-12 DIAGNOSIS — R2681 Unsteadiness on feet: Secondary | ICD-10-CM

## 2021-05-12 LAB — BASIC METABOLIC PANEL
BUN/Creatinine Ratio: 15 (ref 9–20)
BUN: 16 mg/dL (ref 6–24)
CO2: 19 mmol/L — ABNORMAL LOW (ref 20–29)
Calcium: 9.7 mg/dL (ref 8.7–10.2)
Chloride: 111 mmol/L — ABNORMAL HIGH (ref 96–106)
Creatinine, Ser: 1.1 mg/dL (ref 0.76–1.27)
Glucose: 75 mg/dL (ref 70–99)
Potassium: 3.9 mmol/L (ref 3.5–5.2)
Sodium: 144 mmol/L (ref 134–144)
eGFR: 78 mL/min/{1.73_m2} (ref >59)

## 2021-05-12 NOTE — Progress Notes (Signed)
Kidney function normal. ? ?All other values are normal, stable or within acceptable limits. ? ?Rema Fendt, NP 05/12/2021 7:44 AM  ?

## 2021-05-13 NOTE — Therapy (Signed)
[facility] ?[place] ?[place], [place], [address] ?Phone: [phone]   Fax:  [phone] ? ?Physical Therapy Treatment ? ?Patient Details  ?Name: [name] ?MRN: [id number] ?Date of Birth: 20-Nov-1965 ?Referring Provider (PT): Leeroy Cha, MD ? ? ?Encounter Date: 05/12/2021 ? ? PT End of Session - 05/13/21 1047   ? ? Visit Number 16   ? Number of Visits 19   1st 5 visits are at no charge; 30 PT/OT combined  ? Date for PT Re-Evaluation 05/20/21   ? Authorization Type BCBS. 30 visits PT/OT combined, each dicipline counts as 1 visit   ? Authorization - Visit Number 20   18 visits used for PT and OT visits since 01-23-21  ? Authorization - Number of Visits 30   ? PT Start Time 1540   pt arrived 37" late  ? PT Stop Time 1628   ? PT Time Calculation (min) 48 min   ? Activity Tolerance Patient tolerated treatment well   ? Behavior During Therapy [facility] for tasks assessed/performed   ? ?  ?  ? ?  ? ? ?Past Medical History:  ?Diagnosis Date  ? Anxiety   ? Cataract   ? removed left eye  ? Chronic combined systolic and diastolic CHF (congestive heart failure) ([place])   ? EF 25-30%  ? Diastolic dysfunction   ? Dyslipidemia   ? Hyperlipidemia   ? Hypertension   ? Neuromuscular disorder ([place])   ? neuropathy left foot  ? Nonischemic dilated cardiomyopathy ([place])   ? felt secondary to HTN with no ischemia on nuclear stress test 05/2012, EF 25-30%  ? Seizures ([place])   ? last seizure 05-2020- on Keppra  ? Shortness of breath   ? Stroke [facility]) 05/2020  ? ? ?Past Surgical History:  ?Procedure Laterality Date  ? COLONOSCOPY WITH PROPOFOL N/A 12/09/2020  ? Procedure: COLONOSCOPY WITH PROPOFOL;  Surgeon: Yetta Flock, MD;  [facility];  Service: Gastroenterology;  Laterality: N/A;  ? KNEE ARTHROSCOPY Left 04/13/2016  ? Procedure: ARTHROSCOPY KNEE WITH DEBRIDEMENT;  Surgeon: Garald Balding, MD;  Location: [facility];  Service: Orthopedics;   Laterality: Left;  ? KNEE ARTHROSCOPY WITH MEDIAL MENISECTOMY Right 04/13/2016  ? Procedure: KNEE ARTHROSCOPY WITH MEDIAL MENISECTOMY;  Surgeon: Garald Balding, MD;  Location: [facility];  Service: Orthopedics;  Laterality: LEFT not right knee  ? NASAL FRACTURE SURGERY    ? SHOULDER SURGERY Left   ? ? ?There were no vitals filed for this visit. ? ? Subjective Assessment - 05/12/21 1547   ? ? Subjective Pt states he got cortisone injection in Lt shoulder from [facility] MD - states it helped.  Pt reports he went up and down 3 steps at a friend's house with his quad cane and did not use hand rail - did this by himself and did fine; reports it gave him some confidence   ? Pertinent History HTN, cardiomyopathy, chronic combined systolic & diastolic CHF, alcohol use disorder, seizures   ? Currently in Pain? Yes   ? Pain Score 5    ? Pain Location Foot   ? Pain Descriptors / Indicators Tingling   ? Pain Type Neuropathic pain   ? Pain Onset More than a month ago   ? Pain Frequency Constant   ? ?  ?  ? ?  ? ? ? ? ? ? ? ? ? ? ? ? ? ? ? ? ? ? ? ?  [facility] Adult PT Treatment/Exercise - 05/13/21 0001   ? ?  ? Ambulation/Gait  ? Ambulation/Gait Yes   ? Ambulation/Gait Assistance 4: Min guard   ? Ambulation Distance (Feet) 230 Feet   ? Assistive device None   ? Gait Pattern Decreased step length - left;Decreased arm swing - left;Decreased stance time - left;Decreased hip/knee flexion - left;Decreased dorsiflexion - left;Decreased weight shift to left   ? Ambulation Surface Level;Indoor   ? Stairs Yes   ? Stairs Assistance 4: Min guard   ? Stair Management Technique Step to pattern;Forwards;With cane   ? Number of Stairs 8   4 steps x 2 reps = 8 steps total  ?  ? Knee/Hip Exercises: Stretches  ? Active Hamstring Stretch Left;1 rep;30 seconds   Runner's stretch  ? Gastroc Stretch Left;1 rep;2 reps;20 seconds   with use of 3" wedge block at counter  ?  ? Knee/Hip Exercises: Machines for Strengthening  ? Cybex Leg  Press bil. LE's 70# 10 reps:  LLE only 40# 2 sets 10 reps   assist for controlled eccentric return  ?  ? Knee/Hip Exercises: Standing  ? Forward Step Up Left;1 set;10 reps;Step Height: 6";Hand Hold: 2   inside // bars  ? Step Down Left;1 set;10 reps;Hand Hold: 2;Step Height: 4"   inside // bars  ? ?  ?  ? ?  ? ? ?NeuroRe-ed:   ?Tap ups to 4" step with RLE for improved LLE SLS and for closed chain strengthening - 10 reps with pt ?tapping straight up; then 5 reps with pt tapping RLE across midline for improved LLE SLS ? ?Sidestepping 10' x 2 reps without UE support in // bars with cues to keep LLE in neutral (with less external rotation)  ? ? ? ? ? ? ? ? ? PT Short Term Goals - 05/13/21 1050   ? ?  ? PT SHORT TERM GOAL #1  ? Title Pt will improve Berg balance test score by at least 4 points to demo improvement in balance.  Increase Berg score to >/= 49/56 (Target date for all STG's 03-11-21)   ? Baseline 01/06/21: initial score set this date is 40/56;  46/56 on 02-10-21   ? Time 4   ? Period Weeks   ? Status Achieved   ? Target Date 03/11/21   ?  ? PT SHORT TERM GOAL #2  ? Title Improve TUG score from 38.87 secs to </= 33 secs with use of hemiwalker to demo improvement in functional mobility.; Upgraded STG - TUG score </= 26 secs with hemiwalker   ? Baseline 01/06/21: unable to reassess due to time contraints, 38.87 sec's at baseline;  29.53 secs on 02-10-21 with hemiwalker   ? Time 4   ? Period Weeks   ? Status Achieved   ? Target Date 03/11/21   ?  ? PT SHORT TERM GOAL #3  ? Title Amb. with SBQC 230' with CGA on flat, even surface for increased community accessibility.;  Amb. 80' with SBQC with SBA   ? Baseline 350' with SBQC on 02-10-21   ? Time 4   ? Period Weeks   ? Status Achieved   ? Target Date 03/11/21   ?  ? PT SHORT TERM GOAL #4  ? Title Assess foot up brace vs. AFO for LLE for safety with amb.   ? Time 4   ? Period Weeks   ? Status Achieved   ? Target Date 03/11/21   ? ?  ?  ? ?  ? ? ? ?  PT Long Term Goals -  05/13/21 1051   ? ?  ? PT LONG TERM GOAL #1  ? Title Improve Berg balance test score by at least 8 points for reduced fall risk.  Increase Berg score to >/= 52/56 for reduced fall risk.   ? Baseline improved 6 points from 40/56 on 01-06-21 to 46/56 on 02-10-21;  49/56 on 04-07-21   ? Time 6   ? Period Weeks   ? Status On-going   ? Target Date 05/20/21   ?  ? PT LONG TERM GOAL #2  ? Title Increase gait velocity from .68 ft/sec to >/= 1.4 ft/sec with hemiwalker for increased gait efficiency.  Upgraded LTG - increase gait speed to >/= 1.6 ft/sec with SBQC   ? Baseline 47.78 secs = .68 ft/sec;     02-10-21:   26.78 secs = 1.22 ft/sec with hemiwalker;  26.06 secs with SBQC = 1.26 ft/sec   ? Time 6   ? Period Weeks   ? Status On-going   ? Target Date 05/20/21   ?  ? PT LONG TERM GOAL #3  ? Title Improve TUG score to </= 30 secs with hemiwalker to reduce fall risk and to demo incr. functional mobility. Upgraded LTG - score </=24 secs with SBQC   ? Baseline 38.87 secs with hemiwalker;   02-10-21--  29.53 secs with hemiwalker; 04-07-21 =  24.47 secs with SBQC   ? Time 6   ? Period Weeks   ? Status Partially Met   ? Target Date 05/20/21   ?  ? PT LONG TERM GOAL #4  ? Title Amb. 30' without use of assistive device with SBA for household amb. Upgraded goal - modified independent household amb. without device   ? Baseline hemiwalker - 60' with SBA to CGA;  30' with CGA as Lt foot does not clear floor occasionally   ? Time 6   ? Period Weeks   ? Status On-going   ? Target Date 05/20/21   ?  ? PT LONG TERM GOAL #5  ? Title Amb. 500' with SPC with SBA for incr. community accessibility. Upgraded LTG - amb. 550' with SBQC on flat, even surface with SBA   ? Baseline 350' with SBQC on 02-10-21   ? Time 6   ? Period Weeks   ? Status On-going   ? Target Date 05/20/21   ?  ? PT LONG TERM GOAL #6  ? Title Perform floor to stand transfer with UE support on object with SBA.   ? Baseline met 04-07-21   ? Time 8   ? Period Weeks   ? Status Achieved    ? Target Date 04/08/21   ? ?  ?  ? ?  ? ? ? ? ? ? ? ? Plan - 05/13/21 1048   ? ? Clinical Impression Statement Pt continues to have moderate edema in LLE resulting in inability to don AFO.  Some mild weakn

## 2021-05-16 ENCOUNTER — Ambulatory Visit: Payer: BC Managed Care – PPO | Admitting: Physical Therapy

## 2021-05-17 ENCOUNTER — Ambulatory Visit: Payer: BC Managed Care – PPO | Admitting: Physical Therapy

## 2021-05-18 ENCOUNTER — Ambulatory Visit: Payer: Self-pay | Admitting: Physical Therapy

## 2021-05-19 DIAGNOSIS — I5042 Chronic combined systolic (congestive) and diastolic (congestive) heart failure: Secondary | ICD-10-CM | POA: Diagnosis not present

## 2021-05-19 DIAGNOSIS — R569 Unspecified convulsions: Secondary | ICD-10-CM | POA: Diagnosis not present

## 2021-05-19 DIAGNOSIS — I610 Nontraumatic intracerebral hemorrhage in hemisphere, subcortical: Secondary | ICD-10-CM | POA: Diagnosis not present

## 2021-05-25 ENCOUNTER — Ambulatory Visit: Payer: BC Managed Care – PPO | Admitting: Family

## 2021-05-26 ENCOUNTER — Encounter: Payer: Self-pay | Admitting: Physical Therapy

## 2021-05-26 ENCOUNTER — Ambulatory Visit: Payer: BC Managed Care – PPO | Attending: Family | Admitting: Physical Therapy

## 2021-05-26 DIAGNOSIS — M6281 Muscle weakness (generalized): Secondary | ICD-10-CM | POA: Insufficient documentation

## 2021-05-26 DIAGNOSIS — I69354 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-dominant side: Secondary | ICD-10-CM | POA: Insufficient documentation

## 2021-05-26 DIAGNOSIS — R2689 Other abnormalities of gait and mobility: Secondary | ICD-10-CM | POA: Insufficient documentation

## 2021-05-26 DIAGNOSIS — R2681 Unsteadiness on feet: Secondary | ICD-10-CM | POA: Insufficient documentation

## 2021-05-27 DIAGNOSIS — R569 Unspecified convulsions: Secondary | ICD-10-CM | POA: Diagnosis not present

## 2021-05-27 DIAGNOSIS — I5042 Chronic combined systolic (congestive) and diastolic (congestive) heart failure: Secondary | ICD-10-CM | POA: Diagnosis not present

## 2021-05-27 DIAGNOSIS — I610 Nontraumatic intracerebral hemorrhage in hemisphere, subcortical: Secondary | ICD-10-CM | POA: Diagnosis not present

## 2021-05-27 NOTE — Therapy (Signed)
[facility] ?[place] ?[place], [place], [address] ?Phone: [phone]   Fax:  [phone] ? ?Physical Therapy Treatment- ARRIVED/ NO CHARGE ? ?Patient Details  ?Name: [name] ?MRN: [id number] ?Date of Birth: June 28, 1965 ?Referring Provider (PT): Leeroy Cha, MD ? ? ?Encounter Date: 05/26/2021 ? ? PT End of Session - 05/27/21 1150   ? ? Visit Number 16   ? Number of Visits 19   1st 5 visits are at no charge; 30 PT/OT combined  ? Date for PT Re-Evaluation 05/20/21   ? Authorization Type BCBS. 30 visits PT/OT combined, each dicipline counts as 1 visit   ? Authorization - Visit Number 20   18 visits used for PT and OT visits since 01-23-21  ? Authorization - Number of Visits 30   ? PT Start Time 8811   pt arrived 68" late  ? PT Stop Time 0315   ? PT Time Calculation (min) 28 min   ? Activity Tolerance Patient tolerated treatment well   ? Behavior During Therapy [facility] for tasks assessed/performed   ? ?  ?  ? ?  ? ? ?Past Medical History:  ?Diagnosis Date  ? Anxiety   ? Cataract   ? removed left eye  ? Chronic combined systolic and diastolic CHF (congestive heart failure) ([place])   ? EF 25-30%  ? Diastolic dysfunction   ? Dyslipidemia   ? Hyperlipidemia   ? Hypertension   ? Neuromuscular disorder ([place])   ? neuropathy left foot  ? Nonischemic dilated cardiomyopathy ([place])   ? felt secondary to HTN with no ischemia on nuclear stress test 05/2012, EF 25-30%  ? Seizures ([place])   ? last seizure 05-2020- on Keppra  ? Shortness of breath   ? Stroke [facility]) 05/2020  ? ? ?Past Surgical History:  ?Procedure Laterality Date  ? COLONOSCOPY WITH PROPOFOL N/A 12/09/2020  ? Procedure: COLONOSCOPY WITH PROPOFOL;  Surgeon: Yetta Flock, MD;  [facility];  Service: Gastroenterology;  Laterality: N/A;  ? KNEE ARTHROSCOPY Left 04/13/2016  ? Procedure: ARTHROSCOPY KNEE WITH DEBRIDEMENT;  Surgeon: Garald Balding, MD;  Location: [facility];   Service: Orthopedics;  Laterality: Left;  ? KNEE ARTHROSCOPY WITH MEDIAL MENISECTOMY Right 04/13/2016  ? Procedure: KNEE ARTHROSCOPY WITH MEDIAL MENISECTOMY;  Surgeon: Garald Balding, MD;  Location: [facility];  Service: Orthopedics;  Laterality: LEFT not right knee  ? NASAL FRACTURE SURGERY    ? SHOULDER SURGERY Left   ? ? ?There were no vitals filed for this visit. ? ? ? ? ? ? ? ? ? ? ? ? ? ? ? ? ?Pt arrived 15" late for session - "just running late" ? ?Pt now reporting increased Rt knee pain and increased numbness/tingling in LLE with tingling now extending above knee into thigh region ? ?Pt states he came because he wanted to discuss his remaining, limited authorized visits and wants to still do aquatic therapy ? ?No charge for today's session due to inability to check goals due to c/o Rt knee pain and pt arriving 15" late ? ?Pt states he ordered a stimulator from [employer] and wants some help in setting it up for home use; states he did not bring unit to today's session ?Because he didn't think he could walk and carry it in addition to his bag and using quad cane ? ? ? ? ? ? ? ? ? ? ? ? ? ? PT Short Term Goals -  05/26/21 1558   ? ?  ? PT SHORT TERM GOAL #1  ? Title Pt will improve Berg balance test score by at least 4 points to demo improvement in balance.  Increase Berg score to >/= 49/56 (Target date for all STG's 03-11-21)   ? Baseline 01/06/21: initial score set this date is 40/56;  46/56 on 02-10-21   ? Time 4   ? Period Weeks   ? Status Achieved   ? Target Date 03/11/21   ?  ? PT SHORT TERM GOAL #2  ? Title Improve TUG score from 38.87 secs to </= 33 secs with use of hemiwalker to demo improvement in functional mobility.; Upgraded STG - TUG score </= 26 secs with hemiwalker   ? Baseline 01/06/21: unable to reassess due to time contraints, 38.87 sec's at baseline;  29.53 secs on 02-10-21 with hemiwalker   ? Time 4   ? Period Weeks   ? Status Achieved   ? Target Date 03/11/21   ?  ? PT SHORT  TERM GOAL #3  ? Title Amb. with SBQC 230' with CGA on flat, even surface for increased community accessibility.;  Amb. 28' with SBQC with SBA   ? Baseline 350' with SBQC on 02-10-21   ? Time 4   ? Period Weeks   ? Status Achieved   ? Target Date 03/11/21   ?  ? PT SHORT TERM GOAL #4  ? Title Assess foot up brace vs. AFO for LLE for safety with amb.   ? Time 4   ? Period Weeks   ? Status Achieved   ? Target Date 03/11/21   ? ?  ?  ? ?  ? ? ? ? PT Long Term Goals - 05/26/21 1558   ? ?  ? PT LONG TERM GOAL #1  ? Title Improve Berg balance test score by at least 8 points for reduced fall risk.  Increase Berg score to >/= 52/56 for reduced fall risk.   ? Baseline improved 6 points from 40/56 on 01-06-21 to 46/56 on 02-10-21;  49/56 on 04-07-21   ? Time 6   ? Period Weeks   ? Status On-going   ? Target Date 05/20/21   ?  ? PT LONG TERM GOAL #2  ? Title Increase gait velocity from .68 ft/sec to >/= 1.4 ft/sec with hemiwalker for increased gait efficiency.  Upgraded LTG - increase gait speed to >/= 1.6 ft/sec with SBQC   ? Baseline 47.78 secs = .68 ft/sec;     02-10-21:   26.78 secs = 1.22 ft/sec with hemiwalker;  26.06 secs with SBQC = 1.26 ft/sec   ? Time 6   ? Period Weeks   ? Status On-going   ? Target Date 05/20/21   ?  ? PT LONG TERM GOAL #3  ? Title Improve TUG score to </= 30 secs with hemiwalker to reduce fall risk and to demo incr. functional mobility. Upgraded LTG - score </=24 secs with SBQC   ? Baseline 38.87 secs with hemiwalker;   02-10-21--  29.53 secs with hemiwalker; 04-07-21 =  24.47 secs with SBQC   ? Time 6   ? Period Weeks   ? Status Partially Met   ? Target Date 05/20/21   ?  ? PT LONG TERM GOAL #4  ? Title Amb. 30' without use of assistive device with SBA for household amb. Upgraded goal - modified independent household amb. without device   ? Baseline hemiwalker -  71' with SBA to CGA;  30' with CGA as Lt foot does not clear floor occasionally   ? Time 6   ? Period Weeks   ? Status On-going   ? Target  Date 05/20/21   ?  ? PT LONG TERM GOAL #5  ? Title Amb. 500' with SPC with SBA for incr. community accessibility. Upgraded LTG - amb. 550' with SBQC on flat, even surface with SBA   ? Baseline 350' with SBQC on 02-10-21   ? Time 6   ? Period Weeks   ? Status On-going   ? Target Date 05/20/21   ?  ? PT LONG TERM GOAL #6  ? Title Perform floor to stand transfer with UE support on object with SBA.   ? Baseline met 04-07-21   ? Time 8   ? Period Weeks   ? Status Achieved   ? Target Date 04/08/21   ? ?  ?  ? ?  ? ? ? ? ? ? ? ? ? ?Patient will benefit from skilled therapeutic intervention in order to improve the following deficits and impairments:    ? ?Visit Diagnosis: ?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-dominant side ([place]) ? ?Other abnormalities of gait and mobility ? ? ? ? ?Problem List ?Patient Active Problem List  ? Diagnosis Date Noted  ? Colon cancer screening   ? Left hemiplegia ([place]) 07/02/2020  ? 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 Dysphagia, post-stroke   ? Vascular headache   ? Acute blood loss anemia   ? AKI (acute kidney injury) ([place])   ? Essential hypertension   ? Seizures ([place])   ? 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 ICH (intracerebral hemorrhage) ([place]) 06/09/2020  ? Sprain of anterior talofibular ligament of left ankle 01/15/2020  ? Hyperlipidemia, on Lipitor 04/01/2017  ? Alcohol use disorder, mild, abuse 06/30/2016  ? Morbid obesity ([place]) 06/30/2016  ? Nonischemic dilated cardiomyopathy ([place]) 11/08/2012  ? Chronic combined systolic and diastolic CHF (congestive heart failure) ([place]) 11/08/2012  ? HTN (hypertension) 05/25/2012  ? ? ?HERDEY, CXKGY JEHUDJS, PT ?05/27/2021, 11:52 AM ? ?[facility] ?[address] ?[place], [place], [address] ?Phone: [phone]   Fax:  [phone] ? ?Name: [name] ?MRN: [id number] ?Date of Birth: Oct 20, 1965 ? ? ? ?

## 2021-05-29 DIAGNOSIS — I5042 Chronic combined systolic (congestive) and diastolic (congestive) heart failure: Secondary | ICD-10-CM | POA: Diagnosis not present

## 2021-05-29 DIAGNOSIS — R569 Unspecified convulsions: Secondary | ICD-10-CM | POA: Diagnosis not present

## 2021-05-29 DIAGNOSIS — I610 Nontraumatic intracerebral hemorrhage in hemisphere, subcortical: Secondary | ICD-10-CM | POA: Diagnosis not present

## 2021-06-02 ENCOUNTER — Ambulatory Visit: Payer: BC Managed Care – PPO | Admitting: Physical Medicine and Rehabilitation

## 2021-06-02 ENCOUNTER — Ambulatory Visit: Payer: BC Managed Care – PPO | Admitting: Physical Therapy

## 2021-06-02 DIAGNOSIS — R2689 Other abnormalities of gait and mobility: Secondary | ICD-10-CM

## 2021-06-02 DIAGNOSIS — R2681 Unsteadiness on feet: Secondary | ICD-10-CM

## 2021-06-02 DIAGNOSIS — I69354 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-dominant side: Secondary | ICD-10-CM

## 2021-06-02 DIAGNOSIS — M6281 Muscle weakness (generalized): Secondary | ICD-10-CM

## 2021-06-02 NOTE — Therapy (Signed)
[facility] ?[place] ?[place], [place], [address] ?Phone: [phone]   Fax:  [phone] ? ?Physical Therapy Treatment ? ?Patient Details  ?Name: [name] ?MRN: [id number] ?Date of Birth: 04/12/1965 ?Referring Provider (PT): Leeroy Cha, MD ? ? ?Encounter Date: 06/02/2021 ? ? PT End of Session - 06/02/21 2035   ? ? Visit Number 17   ? Number of Visits 19   1st 5 visits are at no charge; 30 PT/OT combined  ? Date for PT Re-Evaluation 05/20/21   ? Authorization Type BCBS. 30 visits PT/OT combined, each dicipline counts as 1 visit   ? Authorization - Visit Number 20   18 visits used for PT and OT visits since 01-23-21  ? Authorization - Number of Visits 30   ? PT Start Time 1045   pt arrived 29" late for session  ? PT Stop Time 1135   ? PT Time Calculation (min) 50 min   ? Activity Tolerance Patient tolerated treatment well   ? Behavior During Therapy [facility] for tasks assessed/performed   ? ?  ?  ? ?  ? ? ?Past Medical History:  ?Diagnosis Date  ? Anxiety   ? Cataract   ? removed left eye  ? Chronic combined systolic and diastolic CHF (congestive heart failure) ([place])   ? EF 25-30%  ? Diastolic dysfunction   ? Dyslipidemia   ? Hyperlipidemia   ? Hypertension   ? Neuromuscular disorder ([place])   ? neuropathy left foot  ? Nonischemic dilated cardiomyopathy ([place])   ? felt secondary to HTN with no ischemia on nuclear stress test 05/2012, EF 25-30%  ? Seizures ([place])   ? last seizure 05-2020- on Keppra  ? Shortness of breath   ? Stroke [facility]) 05/2020  ? ? ?Past Surgical History:  ?Procedure Laterality Date  ? COLONOSCOPY WITH PROPOFOL N/A 12/09/2020  ? Procedure: COLONOSCOPY WITH PROPOFOL;  Surgeon: Yetta Flock, MD;  [facility];  Service: Gastroenterology;  Laterality: N/A;  ? KNEE ARTHROSCOPY Left 04/13/2016  ? Procedure: ARTHROSCOPY KNEE WITH DEBRIDEMENT;  Surgeon: Garald Balding, MD;  Location: [facility];  Service:  Orthopedics;  Laterality: Left;  ? KNEE ARTHROSCOPY WITH MEDIAL MENISECTOMY Right 04/13/2016  ? Procedure: KNEE ARTHROSCOPY WITH MEDIAL MENISECTOMY;  Surgeon: Garald Balding, MD;  Location: [facility];  Service: Orthopedics;  Laterality: LEFT not right knee  ? NASAL FRACTURE SURGERY    ? SHOULDER SURGERY Left   ? ? ?There were no vitals filed for this visit. ? ? Subjective Assessment - 06/02/21 2023   ? ? Subjective Pt brings the foot stimulator machine to session today, requesting assistance in setting it up and operating it; pt states he ordered it from [place] but has not even opened it up yet; reports no changes in status - continues to have numbness and tingling in RLE due to neuropathy; states this is last land visit as he wants to save some visits for later in the year if needed   ? Pertinent History HTN, cardiomyopathy, chronic combined systolic & diastolic CHF, alcohol use disorder, seizures   ? Currently in Pain? Yes   ? Pain Score 5    ? Pain Location Foot   ? Pain Orientation Left   ? Pain Descriptors / Indicators Tingling;Pins and needles   ? Pain Type Neuropathic pain   ? Pain Onset More than a month ago   ? Pain Frequency Constant   ? ?  ?  ? ?  ? ? ? ? ? ? ? ? ? ? ? ? ? ? ? ? ? ? ? ?  [facility] Adult PT Treatment/Exercise - 06/02/21 0001   ? ?  ? Transfers  ? Transfers Sit to Stand;Stand to Sit   ? Sit to Stand 5: Supervision   ? Stand to Sit 5: Supervision   ?  ? Self-Care  ? Self-Care Other Self-Care Comments   ? Other Self-Care Comments  Instructed pt in operation of foot stimulator machine which pt ordered from [facility] to assist in increasing sensation in LLE, impaired by neuropathy ; pt increased intensity to 60 ( varying 1-99 intensity   ?  ? Knee/Hip Exercises: Aerobic  ? Recumbent Bike SciFit level 4.0 8" with UE's and LE's   ? ?  ?  ? ?  ? ? ? ? ? ? ? ? ? ? ? ? PT Short Term Goals - 06/02/21 2036   ? ?  ? PT SHORT TERM GOAL #1  ? Title Pt will improve Berg balance test score by at  least 4 points to demo improvement in balance.  Increase Berg score to >/= 49/56 (Target date for all STG's 03-11-21)   ? Baseline 01/06/21: initial score set this date is 40/56;  46/56 on 02-10-21   ? Time 4   ? Period Weeks   ? Status Achieved   ? Target Date 03/11/21   ?  ? PT SHORT TERM GOAL #2  ? Title Improve TUG score from 38.87 secs to </= 33 secs with use of hemiwalker to demo improvement in functional mobility.; Upgraded STG - TUG score </= 26 secs with hemiwalker   ? Baseline 01/06/21: unable to reassess due to time contraints, 38.87 sec's at baseline;  29.53 secs on 02-10-21 with hemiwalker   ? Time 4   ? Period Weeks   ? Status Achieved   ? Target Date 03/11/21   ?  ? PT SHORT TERM GOAL #3  ? Title Amb. with SBQC 230' with CGA on flat, even surface for increased community accessibility.;  Amb. 35' with SBQC with SBA   ? Baseline 350' with SBQC on 02-10-21   ? Time 4   ? Period Weeks   ? Status Achieved   ? Target Date 03/11/21   ?  ? PT SHORT TERM GOAL #4  ? Title Assess foot up brace vs. AFO for LLE for safety with amb.   ? Time 4   ? Period Weeks   ? Status Achieved   ? Target Date 03/11/21   ? ?  ?  ? ?  ? ? ? ? PT Long Term Goals - 06/02/21 2036   ? ?  ? PT LONG TERM GOAL #1  ? Title Improve Berg balance test score by at least 8 points for reduced fall risk.  Increase Berg score to >/= 52/56 for reduced fall risk.   ? Baseline improved 6 points from 40/56 on 01-06-21 to 46/56 on 02-10-21;  49/56 on 04-07-21   ? Time 6   ? Period Weeks   ? Status Achieved   ? Target Date 05/20/21   ?  ? PT LONG TERM GOAL #2  ? Title Increase gait velocity from .68 ft/sec to >/= 1.4 ft/sec with hemiwalker for increased gait efficiency.  Upgraded LTG - increase gait speed to >/= 1.6 ft/sec with SBQC   ? Baseline 47.78 secs = .68 ft/sec;     02-10-21:   26.78 secs = 1.22 ft/sec with hemiwalker;  26.06 secs with SBQC = 1.26 ft/sec   ? Time 6   ? Period  Weeks   ? Status Not Met   ? Target Date 05/20/21   ?  ? PT LONG TERM GOAL  #3  ? Title Improve TUG score to </= 30 secs with hemiwalker to reduce fall risk and to demo incr. functional mobility. Upgraded LTG - score </=24 secs with SBQC   ? Baseline 38.87 secs with hemiwalker;   02-10-21--  29.53 secs with hemiwalker; 04-07-21 =  24.47 secs with SBQC   ? Time 6   ? Period Weeks   ? Status Partially Met   ? Target Date 05/20/21   ?  ? PT LONG TERM GOAL #4  ? Title Amb. 30' without use of assistive device with SBA for household amb. Upgraded goal - modified independent household amb. without device   ? Baseline hemiwalker - 54' with SBA to CGA;  30' with CGA as Lt foot does not clear floor occasionally   ? Time 6   ? Period Weeks   ? Status Not Met   ? Target Date 05/20/21   ?  ? PT LONG TERM GOAL #5  ? Title Amb. 500' with SPC with SBA for incr. community accessibility. Upgraded LTG - amb. 550' with SBQC on flat, even surface with SBA   ? Baseline 350' with SBQC on 02-10-21   ? Time 6   ? Period Weeks   ? Status Not Met   ? Target Date 05/20/21   ?  ? PT LONG TERM GOAL #6  ? Title Perform floor to stand transfer with UE support on object with SBA.   ? Baseline met 04-07-21   ? Time 8   ? Period Weeks   ? Status Achieved   ? Target Date 04/08/21   ? ?  ?  ? ?  ? ? ? ? ? ? ? ? Plan - 06/02/21 2040   ? ? Clinical Impression Statement Session focused on self care activity with pt requesting assistance with operation of foot stimulator unit he purchased independently from [employer].  Intensity was increased to 60 (varying 1-99) with pt reporting he could barely feel sensation on Rt foot; pt stated he planned to return unit as the stimulation was not as strong as he had anticipated it would be.  Unit was not recommended by myself (PT). No further land visits scheduled at this time but aquatic PT to be scheduled for 3 visits, to conserve limited remaining authorized visits for this calendar year.   ? Personal Factors and Comorbidities Behavior Pattern;Comorbidity 2;Profession;Transportation   ?  Comorbidities cardiomyopathy, systolic & diastolic CHF, ICH, seizures   ? Examination-Activity Limitations [place];Carry;Lift;Stand;Stairs;Squat;Locomotion Level;Transfers;Sleep;Bed Mobility   ? Examination-Participa

## 2021-06-13 ENCOUNTER — Other Ambulatory Visit: Payer: Self-pay | Admitting: Physical Medicine and Rehabilitation

## 2021-06-14 ENCOUNTER — Other Ambulatory Visit: Payer: Self-pay

## 2021-06-14 DIAGNOSIS — E782 Mixed hyperlipidemia: Secondary | ICD-10-CM

## 2021-06-14 MED ORDER — ATORVASTATIN CALCIUM 40 MG PO TABS: 40.0000 mg | ORAL_TABLET | Freq: Every day | ORAL | 2 refills | Status: DC

## 2021-06-15 ENCOUNTER — Ambulatory Visit: Payer: BC Managed Care – PPO | Admitting: Physical Therapy

## 2021-06-16 ENCOUNTER — Other Ambulatory Visit: Payer: Self-pay

## 2021-06-16 DIAGNOSIS — I10 Essential (primary) hypertension: Secondary | ICD-10-CM

## 2021-06-16 DIAGNOSIS — E782 Mixed hyperlipidemia: Secondary | ICD-10-CM

## 2021-06-16 MED ORDER — ATORVASTATIN CALCIUM 40 MG PO TABS: 40.0000 mg | ORAL_TABLET | Freq: Every day | ORAL | 0 refills | Status: DC

## 2021-06-16 MED ORDER — AMLODIPINE BESYLATE 10 MG PO TABS: 10.0000 mg | ORAL_TABLET | Freq: Every day | ORAL | 0 refills | Status: DC

## 2021-06-18 DIAGNOSIS — I610 Nontraumatic intracerebral hemorrhage in hemisphere, subcortical: Secondary | ICD-10-CM | POA: Diagnosis not present

## 2021-06-18 DIAGNOSIS — R569 Unspecified convulsions: Secondary | ICD-10-CM | POA: Diagnosis not present

## 2021-06-18 DIAGNOSIS — I5042 Chronic combined systolic (congestive) and diastolic (congestive) heart failure: Secondary | ICD-10-CM | POA: Diagnosis not present

## 2021-06-27 DIAGNOSIS — I5042 Chronic combined systolic (congestive) and diastolic (congestive) heart failure: Secondary | ICD-10-CM | POA: Diagnosis not present

## 2021-06-27 DIAGNOSIS — I610 Nontraumatic intracerebral hemorrhage in hemisphere, subcortical: Secondary | ICD-10-CM | POA: Diagnosis not present

## 2021-06-27 DIAGNOSIS — R569 Unspecified convulsions: Secondary | ICD-10-CM | POA: Diagnosis not present

## 2021-06-29 DIAGNOSIS — I610 Nontraumatic intracerebral hemorrhage in hemisphere, subcortical: Secondary | ICD-10-CM | POA: Diagnosis not present

## 2021-06-29 DIAGNOSIS — R569 Unspecified convulsions: Secondary | ICD-10-CM | POA: Diagnosis not present

## 2021-06-29 DIAGNOSIS — I5042 Chronic combined systolic (congestive) and diastolic (congestive) heart failure: Secondary | ICD-10-CM | POA: Diagnosis not present

## 2021-07-06 ENCOUNTER — Encounter
Payer: BC Managed Care – PPO | Attending: Physical Medicine and Rehabilitation | Admitting: Physical Medicine and Rehabilitation

## 2021-07-06 DIAGNOSIS — M542 Cervicalgia: Secondary | ICD-10-CM | POA: Diagnosis not present

## 2021-07-06 DIAGNOSIS — M546 Pain in thoracic spine: Secondary | ICD-10-CM | POA: Insufficient documentation

## 2021-07-06 DIAGNOSIS — G4701 Insomnia due to medical condition: Secondary | ICD-10-CM | POA: Insufficient documentation

## 2021-07-06 DIAGNOSIS — R252 Cramp and spasm: Secondary | ICD-10-CM | POA: Insufficient documentation

## 2021-07-06 DIAGNOSIS — G629 Polyneuropathy, unspecified: Secondary | ICD-10-CM | POA: Insufficient documentation

## 2021-07-06 DIAGNOSIS — I69398 Other sequelae of cerebral infarction: Secondary | ICD-10-CM | POA: Insufficient documentation

## 2021-07-06 DIAGNOSIS — I10 Essential (primary) hypertension: Secondary | ICD-10-CM | POA: Insufficient documentation

## 2021-07-07 NOTE — Progress Notes (Signed)
Subjective:    Patient ID: [name], male    DOB: Mar 07, 1965, 56 y.o.   MRN: [id number]  HPI An audio/video tele-health visit is felt to be the most appropriate encounter for this patient at this time. This is a follow up tele-visit via phone. The patient is at home. MD is at office. Prior to scheduling this appointment, our staff discussed the limitations of evaluation and management by telemedicine and the availability of in-person appointments. The patient expressed understanding and agreed to proceed.   [name] presents for f/u of left sided hemiplegia, left shoulder pain, right sided lower back pain, and left sided foot neuropathic pain following CVA, left shoulder pain.  1) CVA -starting water therapy soon'-incredible improvement in ambulation! -his therapists are planning to take him to the pool and told him this may help with his arm pain -pain has been severe at home, as it had been in hospital -his wife purchased 500mg  tablets of tylenol -he is worried about his decreased upper extremity strength and asks if this will ever improve -he is not experiencing much shoulder pain right now -he does have some strength -the benefits from the trigger point injections lwore off within 1 week -pain is severely limiting his function -he is taking Tizanidine and it does not help, does make him sleepy, but his wife is trying to encourage him to stay up during the day -he developed the foot pain a couple of weeks ago -he was wearing a boot at that time so took it off but has still had pain a couple of weeks afterward. -he has not tried anything for this pain -it is worst at night and feels like burning and tingling at his heel. -he has been doing great with therapy and is walking in his home! -his wife asks if he could get a handicap placard.  -the foot is still feeling numb with pins and needles, also painful. He is unsure whether Gabapentin helps- so far nothing he as tried has helped  his neuropathy. His balance is also now off and he has been stumbling more. He actually felt that the Gabapentin made his pain worse. He has never tried Cymbalta but is willing to try it.  -Has been able to use bathroom himself! -he is working with therapy right now.  -he was feeling wobbly over the weekend so had to miss his PT session -shoulder is feeling very numb and cannot make a fist -ready to proceed with Qutenza today -His wife asks for refill of Eucerin and Diclofenac and Tizanidine.  -She asks whether he should still be taking Seroquel -he called medical records and records were faxed on December 28th.  -He called our office today  -He needs medical records to process his disability claims -able to walk much better.  -his wife mentioned to me that she is still worried about him driving. He has been driving around locally to pharmacy   2) Lower extremity edema -worsening -not painful -he asks when his next appointment with me is.  -has gotten used to it -therapist was concerned -painful -sometimes feels cold on the left foot -never took the fluid pills -he is hoping to walk more now that the weather has improved  3) Headaches -started last week and felt loss of balance -this stopped him from doing therapy.  -still having tension in head.  -he takes topamax 25mg  HS.   4) Obesity -he has lost 36 lbs! -214 lbs   5) Left  shoulder pain -intermittent -sometimes he uses voltaren gel and this seems to help the most  6) Left upper extremity neuropathy -started last month -received steroid injection 2 months ago. Asks if this is a side effect of the steroid injection -asks about a supplement and if this is ok to take    Family History  Problem Relation Age of Onset   Hypertension Mother        Passed away when she is 41 secondary to hip surgery complications   CAD Mother    Heart attack Mother    Hypertension Father    Diabetes Brother    Stroke Brother    Heart  attack Maternal Grandmother    Colon cancer Neg Hx    Esophageal cancer Neg Hx    Pancreatic cancer Neg Hx    Stomach cancer Neg Hx    Colon polyps Neg Hx    Social History   Socioeconomic History   Marital status: Married    Spouse name: Not on file   Number of children: Not on file   Years of education: Not on file   Highest education level: Not on file  Occupational History   Not on file  Tobacco Use   Smoking status: Former    Types: Cigarettes    Passive exposure: Past   Smokeless tobacco: Never  Vaping Use   Vaping Use: Never used  Substance and Sexual Activity   Alcohol use: Not Currently   Drug use: Not Currently    Types: Cocaine, Marijuana, Oxycodone   Sexual activity: Yes  Other Topics Concern   Not on file  Social History Narrative   Not on file   Social Determinants of Health   Financial Resource Strain: Not on file  Food Insecurity: Not on file  Transportation Needs: Not on file  Physical Activity: Not on file  Stress: Not on file  Social Connections: Not on file   Past Surgical History:  Procedure Laterality Date   COLONOSCOPY WITH PROPOFOL N/A 12/09/2020   Procedure: COLONOSCOPY WITH PROPOFOL;  Surgeon: Benancio Deeds, MD;  Location: [facility];  Service: Gastroenterology;  Laterality: N/A;   KNEE ARTHROSCOPY Left 04/13/2016   Procedure: ARTHROSCOPY KNEE WITH DEBRIDEMENT;  Surgeon: Valeria Batman, MD;  Location: [facility];  Service: Orthopedics;  Laterality: Left;   KNEE ARTHROSCOPY WITH MEDIAL MENISECTOMY Right 04/13/2016   Procedure: KNEE ARTHROSCOPY WITH MEDIAL MENISECTOMY;  Surgeon: Valeria Batman, MD;  Location: [facility];  Service: Orthopedics;  Laterality: LEFT not right knee   NASAL FRACTURE SURGERY     SHOULDER SURGERY Left    Past Medical History:  Diagnosis Date   Anxiety    Cataract    removed left eye   Chronic combined systolic and diastolic CHF (congestive heart failure) (HCC)     EF 25-30%   Diastolic dysfunction    Dyslipidemia    Hyperlipidemia    Hypertension    Neuromuscular disorder (HCC)    neuropathy left foot   Nonischemic dilated cardiomyopathy (HCC)    felt secondary to HTN with no ischemia on nuclear stress test 05/2012, EF 25-30%   Seizures (HCC)    last seizure 05-2020- on Keppra   Shortness of breath    Stroke (HCC) 05/2020   There were no vitals taken for this visit.  Opioid Risk Score:   Fall Risk Score:  `1  Depression screen [facility] 2/9     03/21/2021   12:36 PM 09/22/2020  11:01 AM 09/14/2020    4:02 PM 09/14/2020    2:29 PM 07/30/2020   11:18 AM  Depression screen PHQ 2/9  Decreased Interest 0 0 0 0 0  Down, Depressed, Hopeless 0 0 0 0 0  PHQ - 2 Score 0 0 0 0 0  Altered sleeping   0  0  Tired, decreased energy   0  0  Change in appetite   0  0  Feeling bad or failure about yourself    0  0  Trouble concentrating   0  0  Moving slowly or fidgety/restless   0  0  Suicidal thoughts   0  0  PHQ-9 Score   0  0  Difficult doing work/chores  Not difficult at all   Not difficult at all    Review of Systems     Objective:   Physical Exam Not performed as patient was seen via phone.    Assessment & Plan:  Mr. Nez is a 56 year old man presenting today for follow-up of left hemiplegia, right sided low back pain, and left sided foot pain following stroke, left shoulder pain.  1) Right sided low back pain: -discussed with patient and wife that Xrs show spinal degeneration -pain appears to be more myofascial -trigger point injections with lidocaine provided <1 week of benefit -Would like to try Botox injections for longer relief as this pain is greatly inhibiting patient's function and quality of life -continue heat and lidocaine patch and home physical and occupational therapy -Can take tylenol 500mg  three times per day -minimize use of Norco as much as possible as not benefiting and is a high risk opioid -can continue tizandine as  needed.   2) left sided foot pain -stop Gabapentin  -he is purchasing a stimulator that he plans to use on his arm and foot.  -start Cymbalta 20mg  daily, discussed side effects of nausea.  -recommended icing 15 minutes three times per day -hold off on therapy until neuropathy improves -can keep compression garment and boot off since these were worsening his pain -Will not repeat Qutenza since it did not did help foot pain -Turmeric to reduce inflammation--can be used in cooking or taken as a supplement.  Benefits of turmeric:  -Highly anti-inflammatory  -Increases antioxidants  -Improves memory, attention, brain disease  -Lowers risk of heart disease  -May help prevent cancer  -Decreases pain  -Alleviates depression  -Delays aging and decreases risk of chronic disease  -Consume with black pepper to increase absorption    3) Right thalamic hemorrhage -provided with a handicap placard.  --progress to quad walker -face to face for AFO today -discussed that most motor improvement occurs in the first 4 weeks, but improvement is still seen in the first 6 months, and some improvement may still be seen in the first 2 weeks -evaluated for vagal nerve stimulation trial for unfortunately will not qualify due to his stroke being hemorrhagic -will send him information about deep brain stimulation, discussed with him that I have seen this help one of my patients  4) Left sided arm pain: -has felt numbness and weakness in his upper extremity.  -refilled tizanidine, lidocaine patch, and diclofenac gel -can consider repeating Qutenza as it did help with his shoulder pain.  -continue tylenol before sleep -discussed only doing steroid injection if he is having pain, as pain is currently controlled with muscle rub -discussed MRI results:  1. Mild-moderate rotator cuff tendinosis without tear. 2. Severe intra-articular  biceps tendinosis with mild tenosynovitis. 3. 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  -continue voltaren gel  -discussed risks and benefits of steroid injection  5) Agitation: resolved, may discontinue seroquel.  6) Bilateral lower extremity edema -recommended avoiding added salt -prescribed lasix 20mg  daily prn  7) HTN 128/68 -commended on improvements in blood pressure! -Advised checking BP daily at home and logging results to bring into follow-up appointment with PCP and myself. -Reviewed BP meds today.  -Advised regarding healthy foods that can help lower blood pressure and provided with a list: 1) citrus foods- high in vitamins and minerals 2) salmon and other fatty fish - reduces inflammation and oxylipins 3) swiss chard (leafy green)- high level of nitrates 4) pumpkin seeds- one of the best natural sources of magnesium 5) Beans and lentils- high in fiber, magnesium, and potassium 6) Berries- high in flavonoids 7) Amaranth (whole grain, can be cooked similarly to rice and oats)- high in magnesium and fiber 8) Pistachios- even more effective at reducing BP than other nuts 9) Carrots- high in phenolic compounds that relax blood vessels and reduce inflammation 10) Celery- contain phthalides that relax tissues of arterial walls 11) Tomatoes- can also improve cholesterol and reduce risk of heart disease 12) Broccoli- good source of magnesium, calcium, and potassium 13) Greek yogurt: high in potassium and calcium 14) Herbs and spices: Celery seed, cilantro, saffron, lemongrass, black cumin, ginseng, cinnamon, cardamom, sweet basil, and ginger 15) Chia and flax seeds- also help to lower cholesterol and blood sugar 16) Beets- high levels of nitrates that relax blood vessels  17) spinach and bananas- high in potassium  -Provided lise of supplements that can help with hypertension:  1) magnesium: one high quality brand is Bi[employer]emizers since it contains all 7 types of magnesium, otherwise over the counter magnesium gluconate  400mg  is a good option 2) B vitamins 3) vitamin D 4) potassium 5) CoQ10 6) L-arginine 7) Vitamin C 8) Beetroot -Educated that goal BP is 120/80. -Made goal to incorporate some of the above foods into diet.    8) Obesity -commended on 38 pound weight loss! Discussed how this can help with his pain as well -current BMI is 30.71  9) Left shoulder pain -XR ordered and results discussed with patient: shows subluxation and glenohumeral arthritis. Discussed applying volataren gel or CBD oil, using heat pad, continuing therapy and avoiding movements that worsen the pain  -plan for MRI of shoulder.   -Discussed Sprint PNS system as an option of pain treatment via neuromodulation. Provided following link for patient to learn more about the system: https://www.sprtherapeutics.com/. Explained mechanism of activating A beta fibers which function in touch, pressure, and vibration, to inhibit the sensations of A delta and C fibers which are responsible for pain transmission.    -will change next appointment to a steroid injection, discussed risks and benefits of steroid injection an will only perform if pain has failed to improve with above interventions.  -Discussed current symptoms of pain and history of pain.  -Discussed benefits of exercise in reducing pain. -Discussed following foods that may reduce pain: 1) Ginger (especially studied for arthritis)- reduce leukotriene production to decrease inflammation 2) Blueberries- high in phytonutrients that decrease inflammation 3) Salmon- marine omega-3s reduce joint swelling and pain 4) Pumpkin seeds- reduce inflammation 5) dark chocolate- reduces inflammation 6) turmeric- reduces inflammation 7) tart cherries - reduce pain and stiffness 8) extra virgin olive oil - its compound olecanthal helps to block prostaglandins  9) chili peppers- can  be eaten or applied topically via capsaicin 10) mint- helpful for headache, muscle aches, joint pain, and  itching 11) garlic- reduces inflammation  Link to further information on diet for chronic pain: http://www.bray.com/   Turmeric to reduce inflammation--can be used in cooking or taken as a supplement.  Benefits of turmeric:  -Highly anti-inflammatory  -Increases antioxidants  -Improves memory, attention, brain disease  -Lowers risk of heart disease  -May help prevent cancer  -Decreases pain  -Alleviates depression  -Delays aging and decreases risk of chronic disease  -Consume with black pepper to increase absorption    Turmeric Milk Recipe:  1 cup milk  1 tsp turmeric  1 tsp cinnamon  1 tsp grated ginger (optional)  Black pepper (boosts the anti-inflammatory properties of turmeric).  1 tsp honey    10) Left upper extremity neuropathy -discussed that it is not likely associated with steroid injection since started 1 month afterward -discussed that since he does have neck pain, it may be beneficial to get an XR of his neck to assess for stenosis -reviewed the ingredients in the supplement he is interested in and discussed that these have shown benefit for neuropathic pain  11) Thoracic spine pain -XR of thoracic spine ordered.   7 minutes spent in discussion of his left upper extremity neuropathy and thoracic back pain, recommended cervical and thoracic spine XRs

## 2021-07-19 DIAGNOSIS — I610 Nontraumatic intracerebral hemorrhage in hemisphere, subcortical: Secondary | ICD-10-CM | POA: Diagnosis not present

## 2021-07-19 DIAGNOSIS — R569 Unspecified convulsions: Secondary | ICD-10-CM | POA: Diagnosis not present

## 2021-07-19 DIAGNOSIS — I5042 Chronic combined systolic (congestive) and diastolic (congestive) heart failure: Secondary | ICD-10-CM | POA: Diagnosis not present

## 2021-07-21 ENCOUNTER — Encounter: Payer: BC Managed Care – PPO | Admitting: Physical Medicine and Rehabilitation

## 2021-07-21 ENCOUNTER — Encounter: Payer: Self-pay | Admitting: Physical Medicine and Rehabilitation

## 2021-07-21 VITALS — BP 133/75 | HR 69 | Temp 97.0°F | Ht 69.0 in | Wt 210.0 lb

## 2021-07-21 DIAGNOSIS — G629 Polyneuropathy, unspecified: Secondary | ICD-10-CM

## 2021-07-21 DIAGNOSIS — M542 Cervicalgia: Secondary | ICD-10-CM | POA: Diagnosis not present

## 2021-07-21 DIAGNOSIS — G4701 Insomnia due to medical condition: Secondary | ICD-10-CM | POA: Diagnosis not present

## 2021-07-21 DIAGNOSIS — M546 Pain in thoracic spine: Secondary | ICD-10-CM | POA: Diagnosis not present

## 2021-07-21 DIAGNOSIS — R252 Cramp and spasm: Secondary | ICD-10-CM

## 2021-07-21 DIAGNOSIS — I69398 Other sequelae of cerebral infarction: Secondary | ICD-10-CM | POA: Diagnosis not present

## 2021-07-21 DIAGNOSIS — I10 Essential (primary) hypertension: Secondary | ICD-10-CM

## 2021-07-21 MED ORDER — BACLOFEN 10 MG PO TABS: 10.0000 mg | ORAL_TABLET | Freq: Every evening | ORAL | 3 refills | Status: DC

## 2021-07-21 MED ORDER — DULOXETINE HCL 20 MG PO CPEP: 20.0000 mg | ORAL_CAPSULE | Freq: Every day | ORAL | 3 refills | Status: DC

## 2021-07-21 NOTE — Progress Notes (Signed)
Subjective:    Patient ID: [name], male    DOB: 1965-03-05, 56 y.o.   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  1) CVA -starting water therapy soon'-incredible improvement in ambulation!  -Neuropathy is moving up to neck.  -was released from the hospital one year today -she is doing exercises.  -left hand is getting on his nerves -he thinks -his therapists are planning to take him to the pool and told him this may help with his arm pain -pain has been severe at home, as it had been in hospital -his wife purchased 500mg  tablets of tylenol -he is worried about his decreased upper extremity strength and asks if this will ever improve -he is not experiencing much shoulder pain right now -he does have some strength -the benefits from the trigger point injections lwore off within 1 week -pain is severely limiting his function -he is taking Tizanidine and it does not help, does make him sleepy, but his wife is trying to encourage him to stay up during the day -he developed the foot pain a couple of weeks ago -he was wearing a boot at that time so took it off but has still had pain a couple of weeks afterward. -he has not tried anything for this pain -it is worst at night and feels like burning and tingling at his heel. -he has been doing great with therapy and is walking in his home! -his wife asks if he could get a handicap placard.  -the foot is still feeling numb with pins and needles, also painful. He is unsure whether Gabapentin helps- so far nothing he as tried has helped his neuropathy. His balance is also now off and he has been stumbling more. He actually felt that the Gabapentin made his pain worse. He has never tried Cymbalta but is willing to try it.  -Has been able to use bathroom himself! -he is working with therapy  right now.  -he was feeling wobbly over the weekend so had to miss his PT session -shoulder is feeling very numb and cannot make a fist -ready to proceed with Qutenza today -His wife asks for refill of Eucerin and Diclofenac and Tizanidine.  -She asks whether he should still be taking Seroquel -he called medical records and records were faxed on December 28th.  -He called our office today  -He needs medical records to process his disability claims -able to walk much better.  -his wife mentioned to me that she is still worried about him driving. He has been driving around locally to pharmacy   2) Lower extremity edema -worsening -not painful -he asks when his next appointment with me is.  -has gotten used to it -therapist was concerned -painful -sometimes feels cold on the left foot -never took the fluid pills -he is hoping to walk more now that the weather has improved  3) Headaches -started last week and felt loss of balance -this stopped him from doing therapy.  -still having tension in head.  -he takes topamax 25mg  HS.   4) Obesity -he has lost 36 lbs! -214 lbs   5) Left shoulder pain -intermittent -sometimes he uses voltaren gel and this seems to help the most  6) Left upper extremity neuropathy -started last month -received steroid injection 2 months ago. Asks if this is a side  effect of the steroid injection -asks about a supplement and if this is ok to take  7) cervical myofascial pain syndrome -he wants to know if injections could help  8) Neuropathy is getting worse -he gets depressed about this.     Family History  Problem Relation Age of Onset   Hypertension Mother        Passed away when she is 61 secondary to hip surgery complications   CAD Mother    Heart attack Mother    Hypertension Father    Diabetes Brother    Stroke Brother    Heart attack Maternal Grandmother    Colon cancer Neg Hx    Esophageal cancer Neg Hx    Pancreatic cancer Neg  Hx    Stomach cancer Neg Hx    Colon polyps Neg Hx    Social History   Socioeconomic History   Marital status: Married    Spouse name: Not on file   Number of children: Not on file   Years of education: Not on file   Highest education level: Not on file  Occupational History   Not on file  Tobacco Use   Smoking status: Former    Types: Cigarettes    Passive exposure: Past   Smokeless tobacco: Never  Vaping Use   Vaping Use: Never used  Substance and Sexual Activity   Alcohol use: Not Currently   Drug use: Not Currently    Types: Cocaine, Marijuana, Oxycodone   Sexual activity: Yes  Other Topics Concern   Not on file  Social History Narrative   Not on file   Social Determinants of Health   Financial Resource Strain: Not on file  Food Insecurity: Not on file  Transportation Needs: Not on file  Physical Activity: Not on file  Stress: Not on file  Social Connections: Not on file   Past Surgical History:  Procedure Laterality Date   COLONOSCOPY WITH PROPOFOL N/A 12/09/2020   Procedure: COLONOSCOPY WITH PROPOFOL;  Surgeon: Benancio Deeds, MD;  Location: [facility];  Service: Gastroenterology;  Laterality: N/A;   KNEE ARTHROSCOPY Left 04/13/2016   Procedure: ARTHROSCOPY KNEE WITH DEBRIDEMENT;  Surgeon: Valeria Batman, MD;  Location: [facility];  Service: Orthopedics;  Laterality: Left;   KNEE ARTHROSCOPY WITH MEDIAL MENISECTOMY Right 04/13/2016   Procedure: KNEE ARTHROSCOPY WITH MEDIAL MENISECTOMY;  Surgeon: Valeria Batman, MD;  Location: [facility];  Service: Orthopedics;  Laterality: LEFT not right knee   NASAL FRACTURE SURGERY     SHOULDER SURGERY Left    Past Medical History:  Diagnosis Date   Anxiety    Cataract    removed left eye   Chronic combined systolic and diastolic CHF (congestive heart failure) (HCC)    EF 25-30%   Diastolic dysfunction    Dyslipidemia    Hyperlipidemia    Hypertension    Neuromuscular  disorder (HCC)    neuropathy left foot   Nonischemic dilated cardiomyopathy (HCC)    felt secondary to HTN with no ischemia on nuclear stress test 05/2012, EF 25-30%   Seizures (HCC)    last seizure 05-2020- on Keppra   Shortness of breath    Stroke (HCC) 05/2020   BP 133/75   Pulse 69   Temp (!) 97 F (36.1 C)   Ht 5\' 9"  (1.753 m)   Wt 210 lb (95.3 kg)   SpO2 96%   BMI 31.01 kg/m   Opioid Risk Score:   Fall  Risk Score:  `1  Depression screen [facility] 2/9     07/21/2021    9:38 AM 03/21/2021   12:36 PM 09/22/2020   11:01 AM 09/14/2020    4:02 PM 09/14/2020    2:29 PM 07/30/2020   11:18 AM  Depression screen PHQ 2/9  Decreased Interest 0 0 0 0 0 0  Down, Depressed, Hopeless 0 0 0 0 0 0  PHQ - 2 Score 0 0 0 0 0 0  Altered sleeping    0  0  Tired, decreased energy    0  0  Change in appetite    0  0  Feeling bad or failure about yourself     0  0  Trouble concentrating    0  0  Moving slowly or fidgety/restless    0  0  Suicidal thoughts    0  0  PHQ-9 Score    0  0  Difficult doing work/chores   Not difficult at all   Not difficult at all    Review of Systems     Objective:   Physical Exam Not performed as patient was seen via phone.    Assessment & Plan:  [name] is a 56 year old man presenting today for follow-up of left hemiplegia, right sided low back pain, and left sided foot pain following stroke, left shoulder pain.  1) Right sided low back pain: -discussed with patient and wife that Xrs show spinal degeneration -pain appears to be more myofascial -trigger point injections with lidocaine provided <1 week of benefit -Would like to try Botox injections for longer relief as this pain is greatly inhibiting patient's function and quality of life -continue heat and lidocaine patch and home physical and occupational therapy -Can take tylenol 500mg  three times per day -minimize use of Norco as much as possible as not benefiting and is a high risk opioid -can continue  tizandine as needed.   2) left sided foot pain -stop Gabapentin  -he is purchasing a stimulator that he plans to use on his arm and foot.  -start Cymbalta 20mg  daily, discussed side effects of nausea.  -recommended icing 15 minutes three times per day -hold off on therapy until neuropathy improves -can keep compression garment and boot off since these were worsening his pain -Will not repeat Qutenza since it did not did help foot pain -Turmeric to reduce inflammation--can be used in cooking or taken as a supplement.  Benefits of turmeric:  -Highly anti-inflammatory  -Increases antioxidants  -Improves memory, attention, brain disease  -Lowers risk of heart disease  -May help prevent cancer  -Decreases pain  -Alleviates depression  -Delays aging and decreases risk of chronic disease  -Consume with black pepper to increase absorption    3) Right thalamic hemorrhage -provided with a handicap placard.  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  -refilled tizanidine, lidocaine patch, and diclofenac gel -can consider repeating Qutenza as it did  help with his shoulder pain.  -continue tylenol before sleep -discussed only doing steroid injection if he is having pain, as pain is currently controlled with muscle rub -discussed MRI results:  1. Mild-moderate rotator cuff tendinosis without tear. 2. Severe intra-articular biceps tendinosis with mild  tenosynovitis. 3. 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  -continue therapy -100U Botox next visit to LUE -continue voltaren gel  -discussed risks and benefits of steroid injection  5) Agitation: resolved, may discontinue seroquel.  6) Bilateral lower extremity edema -recommended avoiding added salt -prescribed lasix 20mg  daily prn  7) HTN 133/75 -commended on improvements in blood pressure! -Advised checking BP daily at home and logging results to bring into follow-up appointment with PCP and myself. -Start Baclofen 10mg  HS -Reviewed BP meds today.  -Advised regarding healthy foods that can help lower blood pressure and provided with a list: 1) citrus foods- high in vitamins and minerals 2) salmon and other fatty fish - reduces inflammation and oxylipins 3) swiss chard (leafy green)- high level of nitrates 4) pumpkin seeds- one of the best natural sources of magnesium 5) Beans and lentils- high in fiber, magnesium, and potassium 6) Berries- high in flavonoids 7) Amaranth (whole grain, can be cooked similarly to rice and oats)- high in magnesium and fiber 8) Pistachios- even more effective at reducing BP than other nuts 9) Carrots- high in phenolic compounds that relax blood vessels and reduce inflammation 10) Celery- contain phthalides that relax tissues of arterial walls 11) Tomatoes- can also improve cholesterol and reduce risk of heart disease 12) Broccoli- good source of magnesium, calcium, and potassium 13) Greek yogurt: high in potassium and calcium 14) Herbs and spices: Celery seed, cilantro, saffron, lemongrass, black cumin, ginseng, cinnamon, cardamom, sweet basil, and ginger 15) Chia and flax seeds- also help to lower cholesterol and blood sugar 16) Beets- high levels of nitrates that relax blood vessels  17) spinach and bananas- high in potassium  -Provided lise of supplements that can help with hypertension:  1) magnesium: one high quality  brand is Bioptemizers since it contains all 7 types of magnesium, otherwise over the counter magnesium gluconate 400mg  is a good option 2) B vitamins 3) vitamin D 4) potassium 5) CoQ10 6) L-arginine 7) Vitamin C 8) Beetroot -Educated that goal BP is 120/80. -Made goal to incorporate some of the above foods into diet.    8) Obesity -commended on 36 pound weight loss! Discussed how this can help with his pain as well -current BMI is 31.01 -weight 210 lbs today -Discussed the benefits of intermittent fasting. Recommended starting with pushing dinner 15 minutes earlier and when this feels easy, continuing to push dinner 15 minutes earlier. Discussed that this can help her body to improve its ability to burn fat rather than glucose, improving insulin sensitivity. Recommended drinking [place] tea in the evening to help curb appetite and for its numerous health benefits.    9) Left shoulder pain -XR ordered and results discussed with patient: shows subluxation and glenohumeral arthritis. Discussed applying volataren gel or CBD oil, using heat pad, continuing therapy and avoiding movements that worsen the pain  -plan for MRI of shoulder.   -Discussed Sprint PNS system as an option of pain treatment via neuromodulation. Provided following link for patient to learn more about the system: https://www.sprtherapeutics.com/. Explained mechanism of activating A beta fibers which function in touch, pressure, and vibration, to inhibit the sensations of A delta and C fibers which are responsible for pain transmission.    -  will change next appointment to a steroid injection, discussed risks and benefits of steroid injection an will only perform if pain has failed to improve with above interventions.  -Discussed current symptoms of pain and history of pain.  -Discussed benefits of exercise in reducing pain. -Discussed following foods that may reduce pain: 1) Ginger (especially studied for arthritis)- reduce  leukotriene production to decrease inflammation 2) Blueberries- high in phytonutrients that decrease inflammation 3) Salmon- marine omega-3s reduce joint swelling and pain 4) Pumpkin seeds- reduce inflammation 5) dark chocolate- reduces inflammation 6) turmeric- reduces inflammation 7) tart cherries - reduce pain and stiffness 8) extra virgin olive oil - its compound olecanthal helps to block prostaglandins  9) chili peppers- can be eaten or applied topically via capsaicin 10) mint- helpful for headache, muscle aches, joint pain, and itching 11) garlic- reduces inflammation  Link to further information on diet for chronic pain: http://www.bray.com/   Turmeric to reduce inflammation--can be used in cooking or taken as a supplement.  Benefits of turmeric:  -Highly anti-inflammatory  -Increases antioxidants  -Improves memory, attention, brain disease  -Lowers risk of heart disease  -May help prevent cancer  -Decreases pain  -Alleviates depression  -Delays aging and decreases risk of chronic disease  -Consume with black pepper to increase absorption    Turmeric Milk Recipe:  1 cup milk  1 tsp turmeric  1 tsp cinnamon  1 tsp grated ginger (optional)  Black pepper (boosts the anti-inflammatory properties of turmeric).  1 tsp honey    10) Left upper extremity neuropathy -discussed that it is not likely associated with steroid injection since started 1 month afterward -discussed that since he does have neck pain, it may be beneficial to get an XR of his neck to assess for stenosis -reviewed the ingredients in the supplement he is interested in and discussed that these have shown benefit for neuropathic pain  11) Thoracic spine pain -XR of thoracic spine ordered.   12) Insomnia:  Start baclofen 10mg  HS

## 2021-07-25 ENCOUNTER — Other Ambulatory Visit: Payer: Self-pay | Admitting: Physical Medicine and Rehabilitation

## 2021-07-27 ENCOUNTER — Other Ambulatory Visit: Payer: Self-pay | Admitting: Physical Medicine and Rehabilitation

## 2021-07-27 DIAGNOSIS — I5042 Chronic combined systolic (congestive) and diastolic (congestive) heart failure: Secondary | ICD-10-CM | POA: Diagnosis not present

## 2021-07-27 DIAGNOSIS — I610 Nontraumatic intracerebral hemorrhage in hemisphere, subcortical: Secondary | ICD-10-CM | POA: Diagnosis not present

## 2021-07-27 DIAGNOSIS — R569 Unspecified convulsions: Secondary | ICD-10-CM | POA: Diagnosis not present

## 2021-07-28 ENCOUNTER — Other Ambulatory Visit: Payer: Self-pay | Admitting: Physical Medicine and Rehabilitation

## 2021-07-28 MED ORDER — TIZANIDINE HCL 4 MG PO TABS: 4.0000 mg | ORAL_TABLET | Freq: Every evening | ORAL | 0 refills | Status: DC | PRN

## 2021-07-29 DIAGNOSIS — R569 Unspecified convulsions: Secondary | ICD-10-CM | POA: Diagnosis not present

## 2021-07-29 DIAGNOSIS — I610 Nontraumatic intracerebral hemorrhage in hemisphere, subcortical: Secondary | ICD-10-CM | POA: Diagnosis not present

## 2021-07-29 DIAGNOSIS — I5042 Chronic combined systolic (congestive) and diastolic (congestive) heart failure: Secondary | ICD-10-CM | POA: Diagnosis not present

## 2021-08-01 ENCOUNTER — Other Ambulatory Visit: Payer: Self-pay

## 2021-08-01 DIAGNOSIS — R3911 Hesitancy of micturition: Secondary | ICD-10-CM

## 2021-08-01 MED ORDER — TAMSULOSIN HCL 0.4 MG PO CAPS: ORAL_CAPSULE | ORAL | 0 refills | Status: DC

## 2021-08-02 ENCOUNTER — Encounter
Payer: BC Managed Care – PPO | Attending: Physical Medicine and Rehabilitation | Admitting: Physical Medicine and Rehabilitation

## 2021-08-02 DIAGNOSIS — R350 Frequency of micturition: Secondary | ICD-10-CM | POA: Insufficient documentation

## 2021-08-02 NOTE — Progress Notes (Signed)
Subjective:    Patient ID: [name], male    DOB: 03-28-1965, 56 y.o.   MRN: [id number]  HPI  Mr. 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   1) CVA -Incredible improvement in ambulation!  -graduated from therapy -Neuropathy is moving up to neck.  -was released from the hospital one year today -she is doing exercises.  -left hand is getting on his nerves -he thinks -his therapists are planning to take him to the pool and told him this may help with his arm pain -pain has been severe at home, as it had been in hospital -his wife purchased 500mg  tablets of tylenol -he is worried about his decreased upper extremity strength and asks if this will ever improve -he is not experiencing much shoulder pain right now -he does have some strength -the benefits from the trigger point injections lwore off within 1 week -pain is severely limiting his function -he is taking Tizanidine and it does not help, does make him sleepy, but his wife is trying to encourage him to stay up during the day -he developed the foot pain a couple of weeks ago -he was wearing a boot at that time so took it off but has still had pain a couple of weeks afterward. -he has not tried anything for this pain -it is worst at night and feels like burning and tingling at his heel. -he has been doing great with therapy and is walking in his home! -his wife asks if he could get a handicap placard.  -the foot is still feeling numb with pins and needles, also painful. He is unsure whether Gabapentin helps- so far nothing he as tried has helped his neuropathy. His balance is also now off and he has been stumbling more. He actually felt that the Gabapentin made his pain worse. He has never tried Cymbalta but is willing to try it.  -Has been able to use bathroom himself! -he is  working with therapy right now.  -he was feeling wobbly over the weekend so had to miss his PT session -shoulder is feeling very numb and cannot make a fist -ready to proceed with Qutenza today -His wife asks for refill of Eucerin and Diclofenac and Tizanidine.  -She asks whether he should still be taking Seroquel -he called medical records and records were faxed on December 28th.  -He called our office today  -He needs medical records to process his disability claims -able to walk much better.  -his wife mentioned to me that she is still worried about him driving. He has been driving around locally to pharmacy   2) Lower extremity edema -worsening -not painful -he asks when his next appointment with me is.  -has gotten used to it -therapist was concerned -painful -sometimes feels cold on the left foot -never took the fluid pills -he is hoping to walk more now that the weather has improved  3) Headaches -started last week and felt loss of balance -this stopped him from doing therapy.  -still having tension in head.  -he takes topamax 25mg  HS.   4) Obesity -he has lost 36 lbs! -214 lbs   5) Left shoulder pain -intermittent -sometimes he uses voltaren gel and this seems to help the most  6) Left upper extremity neuropathy -started last month -received steroid injection 2 months ago. Asks if  this is a side effect of the steroid injection -asks about a supplement and if this is ok to take -he asks if there is anything else he can do for this  7) Cervical myofascial pain syndrome -he wants to know if injections could help  8) Neuropathy is getting worse -he gets depressed about this.   9) Urinary frequency: -notes that this started after taking the Baclofen, but his wife said she never gave it to him -he did take the cymbalta but stopped due to this potential side effect -he is urinating every 5-10 minutes -he is willing to get UA and UC drawn today.     Family  History  Problem Relation Age of Onset   Hypertension Mother        Passed away when she is 70 secondary to hip surgery complications   CAD Mother    Heart attack Mother    Hypertension Father    Diabetes Brother    Stroke Brother    Heart attack Maternal Grandmother    Colon cancer Neg Hx    Esophageal cancer Neg Hx    Pancreatic cancer Neg Hx    Stomach cancer Neg Hx    Colon polyps Neg Hx    Social History   Socioeconomic History   Marital status: Married    Spouse name: Not on file   Number of children: Not on file   Years of education: Not on file   Highest education level: Not on file  Occupational History   Not on file  Tobacco Use   Smoking status: Former    Types: Cigarettes    Passive exposure: Past   Smokeless tobacco: Never  Vaping Use   Vaping Use: Never used  Substance and Sexual Activity   Alcohol use: Not Currently   Drug use: Not Currently    Types: Cocaine, Marijuana, Oxycodone   Sexual activity: Yes  Other Topics Concern   Not on file  Social History Narrative   Not on file   Social Determinants of Health   Financial Resource Strain: Not on file  Food Insecurity: Not on file  Transportation Needs: Not on file  Physical Activity: Not on file  Stress: Not on file  Social Connections: Not on file   Past Surgical History:  Procedure Laterality Date   COLONOSCOPY WITH PROPOFOL N/A 12/09/2020   Procedure: COLONOSCOPY WITH PROPOFOL;  Surgeon: Benancio Deeds, MD;  Location: [facility];  Service: Gastroenterology;  Laterality: N/A;   KNEE ARTHROSCOPY Left 04/13/2016   Procedure: ARTHROSCOPY KNEE WITH DEBRIDEMENT;  Surgeon: Valeria Batman, MD;  Location: [facility];  Service: Orthopedics;  Laterality: Left;   KNEE ARTHROSCOPY WITH MEDIAL MENISECTOMY Right 04/13/2016   Procedure: KNEE ARTHROSCOPY WITH MEDIAL MENISECTOMY;  Surgeon: Valeria Batman, MD;  Location: [facility];  Service: Orthopedics;   Laterality: LEFT not right knee   NASAL FRACTURE SURGERY     SHOULDER SURGERY Left    Past Medical History:  Diagnosis Date   Anxiety    Cataract    removed left eye   Chronic combined systolic and diastolic CHF (congestive heart failure) (HCC)    EF 25-30%   Diastolic dysfunction    Dyslipidemia    Hyperlipidemia    Hypertension    Neuromuscular disorder (HCC)    neuropathy left foot   Nonischemic dilated cardiomyopathy (HCC)    felt secondary to HTN with no ischemia on nuclear stress test 05/2012, EF 25-30%  Seizures (HCC)    last seizure 05-2020- on Keppra   Shortness of breath    Stroke (HCC) 05/2020   There were no vitals taken for this visit.  Opioid Risk Score:   Fall Risk Score:  `1  Depression screen [facility] 2/9     07/21/2021    9:38 AM 03/21/2021   12:36 PM 09/22/2020   11:01 AM 09/14/2020    4:02 PM 09/14/2020    2:29 PM 07/30/2020   11:18 AM  Depression screen PHQ 2/9  Decreased Interest 0 0 0 0 0 0  Down, Depressed, Hopeless 0 0 0 0 0 0  PHQ - 2 Score 0 0 0 0 0 0  Altered sleeping    0  0  Tired, decreased energy    0  0  Change in appetite    0  0  Feeling bad or failure about yourself     0  0  Trouble concentrating    0  0  Moving slowly or fidgety/restless    0  0  Suicidal thoughts    0  0  PHQ-9 Score    0  0  Difficult doing work/chores   Not difficult at all   Not difficult at all    Review of Systems     Objective:   Physical Exam Not performed as patient was seen via phone.    Assessment & Plan:  Mr. Mcentire is a 56 year old man presenting today for follow-up of left hemiplegia, right sided low back pain, and left sided foot pain following stroke, left shoulder pain.  1) Right sided low back pain: -discussed with patient and wife that Xrs show spinal degeneration -pain appears to be more myofascial -trigger point injections with lidocaine provided <1 week of benefit -Would like to try Botox injections for longer relief as this pain is  greatly inhibiting patient's function and quality of life -continue heat and lidocaine patch and home physical and occupational therapy -Can take tylenol 500mg  three times per day -minimize use of Norco as much as possible as not benefiting and is a high risk opioid -can continue tizandine as needed.   2) left sided foot neuropathic pain -stop Gabapentin due to somnolence/lack of efficacy.  -encouraged his exercise -he is purchasing a stimulator that he plans to use on his arm and foot.  -start Cymbalta 20mg  daily, discussed side effects of nausea.  -recommended icing 15 minutes three times per day -hold off on therapy until neuropathy improves -can keep compression garment and boot off since these were worsening his pain -Will not repeat Qutenza since it did not did help foot pain -Turmeric to reduce inflammation--can be used in cooking or taken as a supplement.  Benefits of turmeric:  -Highly anti-inflammatory  -Increases antioxidants  -Improves memory, attention, brain disease  -Lowers risk of heart disease  -May help prevent cancer  -Decreases pain  -Alleviates depression  -Delays aging and decreases risk of chronic disease  -Consume with black pepper to increase absorption    3) Right thalamic hemorrhage -provided with a handicap placard.  --progress to quad walker -face to face for AFO today -discussed that most motor improvement occurs in the first 4 weeks, but improvement is still seen in the first 6 months, and some improvement may still be seen in the first 2 weeks -evaluated for vagal nerve stimulation trial for unfortunately will not qualify due to his stroke being hemorrhagic -will send him information about deep brain  stimulation, discussed with him that I have seen this help one of my patients  4) Left sided arm pain secondary to spasticity -plan for Botox 100U next visit -has felt numbness and weakness in his upper extremity.  -refilled tizanidine,  lidocaine patch, and diclofenac gel -can consider repeating Qutenza as it did help with his shoulder pain.  -continue tylenol before sleep -discussed only doing steroid injection if he is having pain, as pain is currently controlled with muscle rub -discussed MRI results:  1. Mild-moderate rotator cuff tendinosis without tear. 2. Severe intra-articular biceps tendinosis with mild tenosynovitis. 3. 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  -continue therapy -100U Botox next visit to LUE -continue voltaren gel  -discussed risks and benefits of steroid injection  5) Agitation: resolved, may discontinue seroquel.  6) Bilateral lower extremity edema -recommended avoiding added salt -prescribed lasix 20mg  daily prn  7) HTN 133/75 -commended on improvements in blood pressure! -Advised checking BP daily at home and logging results to bring into follow-up appointment with PCP and myself. -Start Baclofen 10mg  HS -Reviewed BP meds today.  -Advised regarding healthy foods that can help lower blood pressure and provided with a list: 1) citrus foods- high in vitamins and minerals 2) salmon and other fatty fish - reduces inflammation and oxylipins 3) swiss chard (leafy green)- high level of nitrates 4) pumpkin seeds- one of the best natural sources of magnesium 5) Beans and lentils- high in fiber, magnesium, and potassium 6) Berries- high in flavonoids 7) Amaranth (whole grain, can be cooked similarly to rice and oats)- high in magnesium and fiber 8) Pistachios- even more effective at reducing BP than other nuts 9) Carrots- high in phenolic compounds that relax blood vessels and reduce inflammation 10) Celery- contain phthalides that relax tissues of arterial walls 11) Tomatoes- can also improve cholesterol and reduce risk of heart disease 12) Broccoli- good source of magnesium, calcium, and potassium 13) Greek yogurt: high in potassium and calcium 14) Herbs and  spices: Celery seed, cilantro, saffron, lemongrass, black cumin, ginseng, cinnamon, cardamom, sweet basil, and ginger 15) Chia and flax seeds- also help to lower cholesterol and blood sugar 16) Beets- high levels of nitrates that relax blood vessels  17) spinach and bananas- high in potassium  -Provided lise of supplements that can help with hypertension:  1) magnesium: one high quality brand is Bioptemizers since it contains all 7 types of magnesium, otherwise over the counter magnesium gluconate 400mg  is a good option 2) B vitamins 3) vitamin D 4) potassium 5) CoQ10 6) L-arginine 7) Vitamin C 8) Beetroot -Educated that goal BP is 120/80. -Made goal to incorporate some of the above foods into diet.    8) Obesity -commended on 36 pound weight loss! Discussed how this can help with his pain as well -current BMI is 31.01 -weight 210 lbs today -Discussed the benefits of intermittent fasting. Recommended starting with pushing dinner 15 minutes earlier and when this feels easy, continuing to push dinner 15 minutes earlier. Discussed that this can help her body to improve its ability to burn fat rather than glucose, improving insulin sensitivity. Recommended drinking Ro[place]ois tea in the evening to help curb appetite and for its numerous health benefits.    9) Left shoulder pain -XR ordered and results discussed with patient: shows subluxation and glenohumeral arthritis. Discussed applying volataren gel or CBD oil, using heat pad, continuing therapy and avoiding movements that worsen the pain  -plan for MRI of shoulder.   -Discussed Sprint PNS  system as an option of pain treatment via neuromodulation. Provided following link for patient to learn more about the system: https://www.sprtherapeutics.com/. Explained mechanism of activating A beta fibers which function in touch, pressure, and vibration, to inhibit the sensations of A delta and C fibers which are responsible for pain transmission.     -will change next appointment to a steroid injection, discussed risks and benefits of steroid injection an will only perform if pain has failed to improve with above interventions.  -Discussed current symptoms of pain and history of pain.  -Discussed benefits of exercise in reducing pain. -Discussed following foods that may reduce pain: 1) Ginger (especially studied for arthritis)- reduce leukotriene production to decrease inflammation 2) Blueberries- high in phytonutrients that decrease inflammation 3) Salmon- marine omega-3s reduce joint swelling and pain 4) Pumpkin seeds- reduce inflammation 5) dark chocolate- reduces inflammation 6) turmeric- reduces inflammation 7) tart cherries - reduce pain and stiffness 8) extra virgin olive oil - its compound olecanthal helps to block prostaglandins  9) chili peppers- can be eaten or applied topically via capsaicin 10) mint- helpful for headache, muscle aches, joint pain, and itching 11) garlic- reduces inflammation  Link to further information on diet for chronic pain: http://www.bray.com/   Turmeric to reduce inflammation--can be used in cooking or taken as a supplement.  Benefits of turmeric:  -Highly anti-inflammatory  -Increases antioxidants  -Improves memory, attention, brain disease  -Lowers risk of heart disease  -May help prevent cancer  -Decreases pain  -Alleviates depression  -Delays aging and decreases risk of chronic disease  -Consume with black pepper to increase absorption    Turmeric Milk Recipe:  1 cup milk  1 tsp turmeric  1 tsp cinnamon  1 tsp grated ginger (optional)  Black pepper (boosts the anti-inflammatory properties of turmeric).  1 tsp honey    10) Left upper extremity neuropathy -discussed that it is not likely associated with steroid injection since started 1 month afterward -discussed that since he does have neck  pain, it may be beneficial to get an XR of his neck to assess for stenosis -reviewed the ingredients in the supplement he is interested in and discussed that these have shown benefit for neuropathic pain  11) Thoracic spine pain -XR of thoracic spine ordered.   12) Insomnia:  Can consider baclofen 10mg  HS  13) Urinary frequency -UA/UC ordered -discussed that I will call tomorrow with results.   11 minutes spent in discussion of urinary frequency, ordering UA/UC to assess for UTI, discussed that I will call tomorrow with results, discussed medications that can be used for neuropathy but that most other than Cymbalta cause somnolence.

## 2021-08-04 DIAGNOSIS — R350 Frequency of micturition: Secondary | ICD-10-CM | POA: Diagnosis not present

## 2021-08-04 DIAGNOSIS — R3912 Poor urinary stream: Secondary | ICD-10-CM | POA: Diagnosis not present

## 2021-08-04 DIAGNOSIS — N5201 Erectile dysfunction due to arterial insufficiency: Secondary | ICD-10-CM | POA: Diagnosis not present

## 2021-08-04 DIAGNOSIS — N401 Enlarged prostate with lower urinary tract symptoms: Secondary | ICD-10-CM | POA: Diagnosis not present

## 2021-08-10 ENCOUNTER — Other Ambulatory Visit: Payer: Self-pay | Admitting: Family

## 2021-08-10 DIAGNOSIS — I10 Essential (primary) hypertension: Secondary | ICD-10-CM

## 2021-08-24 ENCOUNTER — Ambulatory Visit: Payer: BC Managed Care – PPO | Admitting: Adult Health

## 2021-08-24 ENCOUNTER — Telehealth: Payer: Self-pay | Admitting: Adult Health

## 2021-08-24 NOTE — Telephone Encounter (Signed)
Pt called and cancel his appointment stating he can't walk and almost fell getting to his car.

## 2021-08-24 NOTE — Progress Notes (Deleted)
[facility] [address] [place]. [place] [address] [phone]       STROKE FOLLOW UP NOTE  [name] Date of Birth:  11/17/1965 Medical Record Number:  [id number]   Reason for Referral: stroke follow up    SUBJECTIVE:   CHIEF COMPLAINT:  No chief complaint on file.   HPI:   Update 08/24/2021 JM: Patient returns for follow-up visit after prior visit 5 months ago.  Overall stable without new stroke/TIA symptoms.  Continued left spastic hemiparesis ***.   Completed therapies back in May and plans on restarting later this year.   Prior concerns of new onset headaches with repeat MR brain without acute abnormality and reduction in size of prior ICH.  Increase topiramate from 50 mg nightly to 50 mg twice daily ***.   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  Returns today for follow-up visit unaccompanied.  Overall stable from stroke standpoint without new stroke/TIA symptoms.  Reports residual left-sided weakness with left arm and foot neuropathic pain and intermittent blurred vision in left periphery bilaterally although improving. Seen by PMR 2 days ago - continues to manage pain. He frustrated by continued pain and is looking at different home options to help with pain such as tens units. Left Shoulder xray showed subluxation and prominent acromioclavicular and glenohumeral degenerative change.  Continues working with PT/OT. Awaiting AFO brace, recently ordered by PMR. Ambulates with quad cane, no recent falls. Compliant on Keppra, denies side effects, no seizure activity noted.  Compliant on topiramate 50mg  qhs for headaches that were present during hospitalization, denies  side effects, does report some worsening headaches over the past 2 weeks, reports 2 headaches left frontal area associated with blurred vision in left eye and imbalance (leaning backwards), typically will resolve shortly after taking Tylenol, denies photophobia, phonophobia, or N/V. Describes as dull pain, denies pulsating, pressure or stabbing sensation.  He is routinely followed by Dr. ophthalmologist but has not had evaluation since worsening headaches with visual issues. Compliant on atorvastatin without side effects.  Blood pressure today 136/75.  No further concerns at this time.  Initial visit 09/01/2020 JM: [name] is being seen for hospital follow-up accompanied by his wife, Melvyn Neth.  Reports residual left-sided weakness, left shoulder pain and left foot pain, right side back and hip pain, and occasional blurred vision in left periphery. Currently working with [facility] PT/OT/SLP.  He ambulates with hemiwalker during therapy sessions otherwise transfers via wheelchair. Seen by PMR (8/2) via telemed visit and currently on Tylenol, Norco, tizanidine, lidocaine patch and gabapentin. Has f/u visit 8/26 with OV note mentioning trial of Botox.  Continues to experience left foot numbness and pain worse at night -gabapentin helps some.  Denies new stroke/TIA symptoms.  Remains on Keppra 500 mg twice daily tolerating without seizure activity.  Remains on topiramate for headache prophylaxis.  Compliant on atorvastatin 40 mg daily -denies side effects.  Blood pressure today 142/81 -routinely monitors at home and has been stable. Per wife, plans on being seen by urology for voiding difficulties post stroke and current use of tamsulosin.  No further concerns at this time.  Stroke admission 06/09/2020 [name] is a 56 y.o. male with history of hypertension,  obesity, cardiomyopathy, hyperlipidemia, CHF admitted on 06/09/2020 for right-sided gaze, right-sided weakness numbness and fall.  Personally reviewed  hospitalization pertinent progress notes, lab work and imaging.  Evaluated by Dr. Roda Shutters for right thalamic ICH with IVH with midline shift and edema likely due to uncontrolled HTN.  BP treated with Cleviprex with adjustments made to home regimen. MRA no vascular malformations. CUS unremarkable.  EF 65 to 70%.  UDS negative.  LDL 78 on atorvastatin 40 mg daily.  A1c 5.5.  Other stroke risk factors include EtOH use, obesity and suspected sleep apnea.  Stroke team signed off on 5/24 but reevaluated on 5/27 due to altered mental status unresponsive to verbal and tactile stimuli, neck jerking to the left and left gaze deviation likely partial complex seizure.  Patient on Depakote for headache prophylaxis with Depakote level 45. CT head stable ICH and IVH with mild hydrocephalus.  EEG and LTM EEG no evidence of seizures.  Placed on Keppra 500 mg twice daily for seizure prophylaxis.  No additional seizures.  Hospital course complicated by sundowning placed on Seroquel, AKI and headaches.  Therapies recommended CIR for residual dense left hemiplegia, right gaze preference, dysphagia, fatigue and visual deficits. CIR admission 5/31-6/29 -significant for left shoulder pain due to mild subluxation, right-sided low back and hip pain and headaches.  PERTINENT IMAGING/LABS  Per hospitalization 06/09/2020 CT head showed right thalamic ICH with IVH with 4 mm midline shift Repeat CT head no hematoma expansion MRI stable right thalamic ICH with IVH and midline shift MRA no aneurysm or AVM CT repeat x 2 stable right thalamic hemorrhage with mild intraventricular extension. 2D Echo EF 65-70% CUS unremarkable LDL 78 HgbA1c 5.5      ROS:   14 system review of systems performed and negative with exception of those listed in HPI  PMH:  Past Medical History:  Diagnosis Date   Anxiety    Cataract    removed left eye   Chronic combined systolic and diastolic CHF (congestive heart failure) (HCC)    EF 25-30%    Diastolic dysfunction    Dyslipidemia    Hyperlipidemia    Hypertension    Neuromuscular disorder (HCC)    neuropathy left foot   Nonischemic dilated cardiomyopathy (HCC)    felt secondary to HTN with no ischemia on nuclear stress test 05/2012, EF 25-30%   Seizures (HCC)    last seizure 05-2020- on Keppra   Shortness of breath    Stroke (HCC) 05/2020    PSH:  Past Surgical History:  Procedure Laterality Date   COLONOSCOPY WITH PROPOFOL N/A 12/09/2020   Procedure: COLONOSCOPY WITH PROPOFOL;  Surgeon: Benancio Deeds, MD;  Location: [facility];  Service: Gastroenterology;  Laterality: N/A;   KNEE ARTHROSCOPY Left 04/13/2016   Procedure: ARTHROSCOPY KNEE WITH DEBRIDEMENT;  Surgeon: Valeria Batman, MD;  Location: [facility];  Service: Orthopedics;  Laterality: Left;   KNEE ARTHROSCOPY WITH MEDIAL MENISECTOMY Right 04/13/2016   Procedure: KNEE ARTHROSCOPY WITH MEDIAL MENISECTOMY;  Surgeon: Valeria Batman, MD;  Location: [facility];  Service: Orthopedics;  Laterality: LEFT not right knee   NASAL FRACTURE SURGERY     SHOULDER SURGERY Left     Social History:  Social History   Socioeconomic History   Marital status: Married    Spouse name: Not on file   Number of children: Not on file   Years of education: Not on file   Highest education level: Not on file  Occupational History  Not on file  Tobacco Use   Smoking status: Former    Types: Cigarettes    Passive exposure: Past   Smokeless tobacco: Never  Vaping Use   Vaping Use: Never used  Substance and Sexual Activity   Alcohol use: Not Currently   Drug use: Not Currently    Types: Cocaine, Marijuana, Oxycodone   Sexual activity: Yes  Other Topics Concern   Not on file  Social History Narrative   Not on file   Social Determinants of Health   Financial Resource Strain: Not on file  Food Insecurity: Not on file  Transportation Needs: Not on file  Physical Activity: Not on file   Stress: Not on file  Social Connections: Not on file  Intimate Partner Violence: Not on file    Family History:  Family History  Problem Relation Age of Onset   Hypertension Mother        Passed away when she is 45 secondary to hip surgery complications   CAD Mother    Heart attack Mother    Hypertension Father    Diabetes Brother    Stroke Brother    Heart attack Maternal Grandmother    Colon cancer Neg Hx    Esophageal cancer Neg Hx    Pancreatic cancer Neg Hx    Stomach cancer Neg Hx    Colon polyps Neg Hx     Medications:   Current Outpatient Medications on File Prior to Visit  Medication Sig Dispense Refill   amLODipine (NORVASC) 10 MG tablet Take 1 tablet (10 mg total) by mouth daily. 90 tablet 0   atorvastatin (LIPITOR) 40 MG tablet Take 1 tablet (40 mg total) by mouth daily. 90 tablet 0   baclofen (LIORESAL) 10 MG tablet Take 1 tablet (10 mg total) by mouth at bedtime. 90 each 3   cloNIDine (CATAPRES) 0.1 MG tablet Take 1 tablet (0.1 mg total) by mouth 3 (three) times daily. 360 tablet 0   diclofenac Sodium (VOLTAREN) 1 % GEL Apply 2 g topically 4 (four) times daily. 200 g 0   DULoxetine (CYMBALTA) 20 MG capsule Take 1 capsule (20 mg total) by mouth daily. 90 capsule 3   folic acid (FOLVITE) 1 MG tablet Take 1 tablet by mouth daily.     gabapentin (NEURONTIN) 300 MG capsule Take 1 capsule (300 mg total) by mouth 3 (three) times daily. 90 capsule 11   labetalol (NORMODYNE) 100 MG tablet Take 1 tablet by mouth twice daily 180 tablet 0   levETIRAcetam (KEPPRA) 500 MG tablet Take 1 tablet (500 mg total) by mouth 2 (two) times daily. 180 tablet 3   losartan (COZAAR) 50 MG tablet Take 1 tablet (50 mg total) by mouth 2 (two) times daily. 240 tablet 0   Multiple Vitamins-Minerals (CERTAVITE/ANTIOXIDANTS) TABS Take 1 tablet by mouth daily. 30 tablet 0   tamsulosin (FLOMAX) 0.4 MG CAPS capsule TAKE 1 CAPSULE BY MOUTH ONCE DAILY AFTER SUPPER 30 capsule 0   thiamine 100 MG tablet  Take 1 tablet (100 mg total) by mouth daily. 30 tablet 0   tiZANidine (ZANAFLEX) 4 MG tablet Take 1 tablet (4 mg total) by mouth at bedtime as needed for muscle spasms. 30 tablet 0   No current facility-administered medications on file prior to visit.    Allergies:  No Known Allergies    OBJECTIVE:  Physical Exam  There were no vitals filed for this visit.  There is no height or weight on file to calculate BMI.  No results found.  General: well developed, well nourished, very pleasant middle-aged African-American male, seated, in no evident distress Head: head normocephalic and atraumatic.   Neck: supple with no carotid or supraclavicular bruits Cardiovascular: regular rate and rhythm, no murmurs Musculoskeletal: no deformity; pitting edema L>R chronic  Skin:  no rash/petichiae Vascular:  Normal pulses all extremities   Neurologic Exam Mental Status: Awake and fully alert.  Fluent speech and language.  Oriented to place and time. Recent memory mildly impaired and remote memory intact. Attention span, concentration and fund of knowledge appropriate during visit. Mood and affect appropriate.  Cranial Nerves: Pupils equal, briskly reactive to light. Extraocular movements full without nystagmus. Visual fields full to confrontation. Hearing intact.  Mild left lower facial weakness.  Tongue, palate moves normally and symmetrically.  Motor: Normal strength, bulk and tone right upper and lower extremity LUE: 4/5 deltoid, elbow flexion and extension and handgrip  LLE: 4/5 hip flexor, KF and KE, 3/5 ADF and APF Sensory.:  decreased sensation LLE compared to RLE, intact BUE Coordination: Rapid alternating movements normal in all extremities on right side. Finger-to-nose and heel-to-shin performed accurately on right side. Gait and Station: stands from seated position with mild difficulty.  Gait demonstrates hemiplegic circumduction gait with use of quad cane.  Tandem walk and heel toe not  attempted Reflexes: Brisk on left side, 1+ right side.  Toes downgoing.          ASSESSMENT: [name] is a 56 y.o. year old male with right thalamic ICH with IVH on 06/09/2020 like secondary to uncontrolled HTN with seizure activity on 5/27. Vascular risk factors include HTN, HLD, cardiomyopathy, CHF, and etoh use .      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 Okay to start aquatic therapy with PT.  On disability managed by PCP Continue to follow with PMR as scheduled for ongoing left-sided neuropathic pain  Discussed secondary stroke prevention measures and importance of close PCP follow up for aggressive stroke risk factor management including HTN with BP goal<130/90 and HLD with LDL goal<70. F/u visit with PCP 8/22 with plans on repeat lab work I have gone over the pathophysiology of stroke, warning signs and symptoms, risk factors and their management in some detail with instructions to go to the closest emergency room for symptoms of concern. Headache, new onset:  Continue topiramate 50 mg twice daily MRI of the brain 03/2021 no acute findings, chronic right thalamic hemorrhage with expected reduction in size associated with blurred vision and imbalance.  Headache present initially after ICH but resolved shortly after discharge. Reoccurrence over the past 2 weeks. Exam unremarkable for new findings and noted improvement since prior visit. Recommend repeat MR brain w/wo contrast to rule out any new abnormality.  Recommend increasing topiramate to 50 mg twice daily. If imaging unremarkable and vision symptoms persist, would recommend follow-up with ophthalmologist.  Discussed precautions of calling 911 immediately with any worsening headache or new stroke/TIA symptoms Seizure, post stroke:  continue keppra 500mg  twice daily for seizure prevention - refill provided    Follow up in 5 months or call earlier if needed    CC:  PCP: Rema Fendt, NP    I spent 38 minutes of face-to-face and non-face-to-face time with patient.  This included previsit chart review, lab review, study review,  order entry, electronic health record documentation, patient and wife education and discussion regarding prior stroke with residual deficits, post stroke seizures and ongoing use of Keppra, new onset headaches and further evaluation and treatment plan, secondary stroke prevention measures and importance of managing stroke risk factors and answered all other questions to patients satisfaction  Ihor Austin, AGNP-BC  [facility] [address] [place], [place] [address]  Phone [phone] Fax [phone] Note: This document was prepared with digital dictation and possible smart phrase technology. Any transcriptional errors that result from this process are unintentional.

## 2021-08-27 DIAGNOSIS — I5042 Chronic combined systolic (congestive) and diastolic (congestive) heart failure: Secondary | ICD-10-CM | POA: Diagnosis not present

## 2021-08-27 DIAGNOSIS — I610 Nontraumatic intracerebral hemorrhage in hemisphere, subcortical: Secondary | ICD-10-CM | POA: Diagnosis not present

## 2021-08-27 DIAGNOSIS — R569 Unspecified convulsions: Secondary | ICD-10-CM | POA: Diagnosis not present

## 2021-08-29 DIAGNOSIS — R569 Unspecified convulsions: Secondary | ICD-10-CM | POA: Diagnosis not present

## 2021-08-29 DIAGNOSIS — I610 Nontraumatic intracerebral hemorrhage in hemisphere, subcortical: Secondary | ICD-10-CM | POA: Diagnosis not present

## 2021-08-29 DIAGNOSIS — I5042 Chronic combined systolic (congestive) and diastolic (congestive) heart failure: Secondary | ICD-10-CM | POA: Diagnosis not present

## 2021-09-05 NOTE — Progress Notes (Deleted)
Patient ID: [name], male    DOB: 08-20-1965  MRN: [id number]  CC: Chronic Care Management   Subjective: [name] is a 56 y.o. male who presents for chronic care management.   His concerns today include:  HTN - Amlodipine, Clonidine, and Losartan   DM screening   Patient Active Problem List   Diagnosis Date Noted   Colon cancer screening    Left hemiplegia (HCC) 07/02/2020   Cognitive deficit due to recent stroke 07/02/2020   Dysphagia, post-stroke    Vascular headache    Acute blood loss anemia    AKI (acute kidney injury) (HCC)    Essential hypertension    Seizures (HCC)    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 90 tablet 0   atorvastatin (LIPITOR) 40 MG tablet Take 1 tablet (40 mg total) by mouth daily. 90 tablet 0   baclofen (LIORESAL) 10 MG tablet Take 1 tablet (10 mg total) by mouth at bedtime. 90 each 3   cloNIDine (CATAPRES) 0.1 MG tablet Take 1 tablet (0.1 mg total) by mouth 3 (three) times daily. 360 tablet 0   diclofenac Sodium (VOLTAREN) 1 % GEL Apply 2 g topically 4 (four) times daily. 200 g 0   DULoxetine (CYMBALTA) 20 MG capsule Take 1 capsule (20 mg total) by mouth daily. 90 capsule 3   folic acid (FOLVITE) 1 MG tablet Take 1 tablet by mouth daily.     gabapentin (NEURONTIN) 300 MG capsule Take 1 capsule (300 mg total) by mouth 3 (three) times daily. 90 capsule 11    labetalol (NORMODYNE) 100 MG tablet Take 1 tablet by mouth twice daily 180 tablet 0   levETIRAcetam (KEPPRA) 500 MG tablet Take 1 tablet (500 mg total) by mouth 2 (two) times daily. 180 tablet 3   losartan (COZAAR) 50 MG tablet Take 1 tablet (50 mg total) by mouth 2 (two) times daily. 240 tablet 0   Multiple Vitamins-Minerals (CERTAVITE/ANTIOXIDANTS) TABS Take 1 tablet by mouth daily. 30 tablet 0   tamsulosin (FLOMAX) 0.4 MG CAPS capsule TAKE 1 CAPSULE BY MOUTH ONCE DAILY AFTER SUPPER 30 capsule 0   thiamine 100 MG tablet Take 1 tablet (100 mg total) by mouth daily. 30 tablet 0   tiZANidine (ZANAFLEX) 4 MG tablet Take 1 tablet (4 mg total) by mouth at bedtime as needed for muscle spasms. 30 tablet 0   No current facility-administered medications on file prior to visit.    No Known Allergies  Social History   Socioeconomic History   Marital status: Married    Spouse name: Not on file   Number of children: Not on file   Years of education: Not on file   Highest education level: Not on file  Occupational History   Not on file  Tobacco Use   Smoking status: Former    Types: Cigarettes    Passive exposure: Past   Smokeless tobacco: Never  Vaping Use  Vaping Use: Never used  Substance and Sexual Activity   Alcohol use: Not Currently   Drug use: Not Currently    Types: Cocaine, Marijuana, Oxycodone   Sexual activity: Yes  Other Topics Concern   Not on file  Social History Narrative   Not on file   Social Determinants of Health   Financial Resource Strain: Not on file  Food Insecurity: Not on file  Transportation Needs: Not on file  Physical Activity: Not on file  Stress: Not on file  Social Connections: Not on file  Intimate Partner Violence: Not on file    Family History  Problem Relation Age of Onset   Hypertension Mother        Passed away when she is 70 secondary to hip surgery complications   CAD Mother    Heart attack Mother    Hypertension Father    Diabetes  Brother    Stroke Brother    Heart attack Maternal Grandmother    Colon cancer Neg Hx    Esophageal cancer Neg Hx    Pancreatic cancer Neg Hx    Stomach cancer Neg Hx    Colon polyps Neg Hx     Past Surgical History:  Procedure Laterality Date   COLONOSCOPY WITH PROPOFOL N/A 12/09/2020   Procedure: COLONOSCOPY WITH PROPOFOL;  Surgeon: Benancio Deeds, MD;  Location: [facility];  Service: Gastroenterology;  Laterality: N/A;   KNEE ARTHROSCOPY Left 04/13/2016   Procedure: ARTHROSCOPY KNEE WITH DEBRIDEMENT;  Surgeon: Valeria Batman, MD;  Location: [facility];  Service: Orthopedics;  Laterality: Left;   KNEE ARTHROSCOPY WITH MEDIAL MENISECTOMY Right 04/13/2016   Procedure: KNEE ARTHROSCOPY WITH MEDIAL MENISECTOMY;  Surgeon: Valeria Batman, MD;  Location: [facility];  Service: Orthopedics;  Laterality: LEFT not right knee   NASAL FRACTURE SURGERY     SHOULDER SURGERY Left     ROS: Review of Systems Negative except as stated above  PHYSICAL EXAM: There were no vitals taken for this visit.  Physical Exam  {male adult master:310786} {male adult master:310785}     Latest Ref Rng & Units 05/11/2021    4:52 PM 09/29/2020    4:45 PM 07/19/2020    5:30 AM  CMP  Glucose 70 - 99 mg/dL 75  88  102   BUN 6 - 24 mg/dL 16  14  14    Creatinine 0.76 - 1.27 mg/dL  5.85  2.77   Sodium 134 - 144 mmol/L 144  143  138   Potassium 3.5 - 5.2 mmol/L 3.9  3.9  3.8   Chloride 96 - 106 mmol/L 111  107  108   CO2 20 - 29 mmol/L 19  20  22    Calcium 8.7 - 10.2 mg/dL 9.7  8.24  9.4   Total Protein 6.0 - 8.5 g/dL  7.7    Total Bilirubin 0.0 - 1.2 mg/dL  0.4    Alkaline Phos 44 - 121 IU/L  105    AST 0 - 40 IU/L  18    ALT 0 - 44 IU/L  22     Lipid Panel     Component Value Date/Time   CHOL 154 09/29/2020 1645   TRIG 126 09/29/2020 1645   HDL 45 09/29/2020 1645   CHOLHDL 3.4 09/29/2020 1645   CHOLHDL 3.8 06/09/2020 1809   VLDL 66 (H) 06/09/2020  1809   LDLCALC 87 09/29/2020 1645    CBC    Component Value Date/Time  WBC 5.7 09/29/2020 1645   WBC 6.0 07/19/2020 0530   RBC 4.39 09/29/2020 1645   RBC 3.61 (L) 07/19/2020 0530   HGB 13.4 09/29/2020 1645   HCT 38.0 09/29/2020 1645   PLT 326 09/29/2020 1645   MCV 87 09/29/2020 1645   MCH 30.5 09/29/2020 1645   MCH 31.6 07/19/2020 0530   MCHC 35.3 09/29/2020 1645   MCHC 33.4 07/19/2020 0530   RDW 11.6 09/29/2020 1645   LYMPHSABS 1.8 06/28/2020 1103   MONOABS 0.8 06/28/2020 1103   EOSABS 0.1 06/28/2020 1103   BASOSABS 0.0 06/28/2020 1103    ASSESSMENT AND PLAN:  There are no diagnoses linked to this encounter.   Patient was given the opportunity to ask questions.  Patient verbalized understanding of the plan and was able to repeat key elements of the plan. Patient was given clear instructions to go to Emergency Department or return to medical center if symptoms don't improve, worsen, or new problems develop.The patient verbalized understanding.   No orders of the defined types were placed in this encounter.    Requested Prescriptions    No prescriptions requested or ordered in this encounter    No follow-ups on file.  Rema Fendt, NP

## 2021-09-07 NOTE — Progress Notes (Signed)
Patient ID: [name], male    DOB: Mar 02, 1965  MRN: [id number]  CC: Chronic Care Management   Subjective: [name] is a 56 y.o. male who presents for chronic care management.   His concerns today include:  Doing well on blood pressure medications without issues or concerns. His wife is checking blood pressures at home and similar to today's reading. Reports feeling tired sometimes. No further issues or concerns.   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 90 tablet 0   baclofen (LIORESAL) 10 MG tablet Take 1 tablet (10 mg total) by mouth at bedtime. 90 each 3   diclofenac Sodium (VOLTAREN) 1 % GEL Apply 2 g topically 4 (four) times daily. 200 g 0   DULoxetine (CYMBALTA) 20 MG capsule Take 1 capsule (20 mg total) by mouth daily. 90 capsule 3   folic acid (FOLVITE) 1 MG tablet Take 1 tablet by mouth daily.     gabapentin (NEURONTIN) 300 MG capsule Take 1 capsule (300 mg total) by mouth 3 (three) times daily. 90 capsule 11   labetalol (NORMODYNE) 100 MG tablet Take 1 tablet by mouth  twice daily 180 tablet 0   levETIRAcetam (KEPPRA) 500 MG tablet Take 1 tablet (500 mg total) by mouth 2 (two) times daily. 180 tablet 3   Multiple Vitamins-Minerals (CERTAVITE/ANTIOXIDANTS) TABS Take 1 tablet by mouth daily. 30 tablet 0   tadalafil (CIALIS) 5 MG tablet Take 5 mg by mouth every morning.     tamsulosin (FLOMAX) 0.4 MG CAPS capsule TAKE 1 CAPSULE BY MOUTH ONCE DAILY AFTER SUPPER 30 capsule 0   thiamine 100 MG tablet Take 1 tablet (100 mg total) by mouth daily. 30 tablet 0   tiZANidine (ZANAFLEX) 4 MG tablet Take 1 tablet (4 mg total) by mouth at bedtime as needed for muscle spasms. 30 tablet 0   topiramate (TOPAMAX) 50 MG tablet Take 50 mg by mouth 2 (two) times daily.     No current facility-administered medications on file prior to visit.    No Known Allergies  Social History   Socioeconomic History   Marital status: Married    Spouse name: Not on file   Number of children: Not on file   Years of education: Not on file   Highest education level: Not on file  Occupational History   Not on file  Tobacco Use   Smoking status: Former    Types: Cigarettes    Passive exposure: Past   Smokeless tobacco: Never  Vaping Use   Vaping Use: Never used  Substance  and Sexual Activity   Alcohol use: Not Currently   Drug use: Not Currently    Types: Cocaine, Marijuana, Oxycodone   Sexual activity: Yes  Other Topics Concern   Not on file  Social History Narrative   Not on file   Social Determinants of Health   Financial Resource Strain: Not on file  Food Insecurity: Not on file  Transportation Needs: Not on file  Physical Activity: Not on file  Stress: Not on file  Social Connections: Not on file  Intimate Partner Violence: Not on file    Family History  Problem Relation Age of Onset   Hypertension Mother        Passed away when she is 6 secondary to hip surgery complications   CAD Mother    Heart attack Mother    Hypertension Father    Diabetes Brother     Stroke Brother    Heart attack Maternal Grandmother    Colon cancer Neg Hx    Esophageal cancer Neg Hx    Pancreatic cancer Neg Hx    Stomach cancer Neg Hx    Colon polyps Neg Hx     Past Surgical History:  Procedure Laterality Date   COLONOSCOPY WITH PROPOFOL N/A 12/09/2020   Procedure: COLONOSCOPY WITH PROPOFOL;  Surgeon: Benancio Deeds, MD;  Location: [facility];  Service: Gastroenterology;  Laterality: N/A;   KNEE ARTHROSCOPY Left 04/13/2016   Procedure: ARTHROSCOPY KNEE WITH DEBRIDEMENT;  Surgeon: Valeria Batman, MD;  Location: [facility];  Service: Orthopedics;  Laterality: Left;   KNEE ARTHROSCOPY WITH MEDIAL MENISECTOMY Right 04/13/2016   Procedure: KNEE ARTHROSCOPY WITH MEDIAL MENISECTOMY;  Surgeon: Valeria Batman, MD;  Location: [facility];  Service: Orthopedics;  Laterality: LEFT not right knee   NASAL FRACTURE SURGERY     SHOULDER SURGERY Left     ROS: Review of Systems Negative except as stated above  PHYSICAL EXAM: BP (!) 117/56 (BP Location: Right Arm, Patient Position: Sitting, Cuff Size: Large)   Pulse 69   Temp 98.3 F (36.8 C)   Resp 16   Ht 5' 9.02" (1.753 m)   Wt 205 lb (93 kg)   SpO2 98%   BMI 30.26 kg/m   Physical Exam HENT:     Head: Normocephalic and atraumatic.  Eyes:     Extraocular Movements: Extraocular movements intact.     Conjunctiva/sclera: Conjunctivae normal.     Pupils: Pupils are equal, round, and reactive to light.  Cardiovascular:     Rate and Rhythm: Normal rate and regular rhythm.     Pulses: Normal pulses.     Heart sounds: Normal heart sounds.  Pulmonary:     Effort: Pulmonary effort is normal.     Breath sounds: Normal breath sounds.  Musculoskeletal:     Cervical back: Normal range of motion and neck supple.  Neurological:     General: No focal deficit present.     Mental Status: He is alert and oriented to person, place, and time.  Psychiatric:        Mood and Affect:  Mood normal.        Behavior: Behavior normal.    Results for orders placed or performed in visit on 09/14/21  POCT glycosylated hemoglobin (Hb A1C)  Result Value Ref Range   Hemoglobin A1C 5.0 4.0 - 5.6 %   HbA1c POC (<> result, manual entry)     HbA1c, POC (prediabetic range)     HbA1c, POC (  controlled diabetic range)       ASSESSMENT AND PLAN: 1. Essential (primary) hypertension - Continue Amlodipine, Clonidine, and Losartan as prescribed. - Continue Labetalol as prescribed. No refills needed as of present.  - Counseled on blood pressure goal of less than 130/80, low-sodium, DASH diet, medication compliance, and 150 minutes of moderate intensity exercise per week as tolerated. Counseled on medication adherence and adverse effects. - Referral to Cardiology for further evaluation and management.  - Ambulatory referral to Cardiology - losartan (COZAAR) 50 MG tablet; Take 1 tablet (50 mg total) by mouth 2 (two) times daily.  Dispense: 60 tablet; Refill: 2 - amLODipine (NORVASC) 10 MG tablet; Take 1 tablet (10 mg total) by mouth daily.  Dispense: 30 tablet; Refill: 2 - cloNIDine (CATAPRES) 0.1 MG tablet; Take 1 tablet (0.1 mg total) by mouth 3 (three) times daily.  Dispense: 90 tablet; Refill: 2  2. Hypotension, unspecified hypotension type - Asymptomatic today in office.  - Referral to Cardiology for further evaluation and management.  - Ambulatory referral to Cardiology  3. Diabetes mellitus screening - Hemoglobin A1c 5.0 %, no diabetes.  - POCT glycosylated hemoglobin (Hb A1C)    Patient was given the opportunity to ask questions.  Patient verbalized understanding of the plan and was able to repeat key elements of the plan. Patient was given clear instructions to go to Emergency Department or return to medical center if symptoms don't improve, worsen, or new problems develop.The patient verbalized understanding.   Orders Placed This Encounter  Procedures   Ambulatory  referral to Cardiology   POCT glycosylated hemoglobin (Hb A1C)     Requested Prescriptions   Signed Prescriptions Disp Refills   losartan (COZAAR) 50 MG tablet 60 tablet 2    Sig: Take 1 tablet (50 mg total) by mouth 2 (two) times daily.   amLODipine (NORVASC) 10 MG tablet 30 tablet 2    Sig: Take 1 tablet (10 mg total) by mouth daily.   cloNIDine (CATAPRES) 0.1 MG tablet 90 tablet 2    Sig: Take 1 tablet (0.1 mg total) by mouth 3 (three) times daily.    Follow-up with primary provider as scheduled.   Rema Fendt, NP

## 2021-09-12 ENCOUNTER — Ambulatory Visit: Payer: BC Managed Care – PPO | Admitting: Family

## 2021-09-13 ENCOUNTER — Ambulatory Visit: Payer: BC Managed Care – PPO | Admitting: Family

## 2021-09-13 DIAGNOSIS — I10 Essential (primary) hypertension: Secondary | ICD-10-CM

## 2021-09-13 DIAGNOSIS — Z131 Encounter for screening for diabetes mellitus: Secondary | ICD-10-CM

## 2021-09-14 ENCOUNTER — Ambulatory Visit: Payer: BC Managed Care – PPO | Admitting: Family

## 2021-09-14 VITALS — BP 117/56 | HR 69 | Temp 98.3°F | Resp 16 | Ht 69.02 in | Wt 205.0 lb

## 2021-09-14 DIAGNOSIS — I10 Essential (primary) hypertension: Secondary | ICD-10-CM

## 2021-09-14 DIAGNOSIS — I959 Hypotension, unspecified: Secondary | ICD-10-CM | POA: Diagnosis not present

## 2021-09-14 DIAGNOSIS — Z131 Encounter for screening for diabetes mellitus: Secondary | ICD-10-CM

## 2021-09-14 LAB — POCT GLYCOSYLATED HEMOGLOBIN (HGB A1C): Hemoglobin A1C: 5 % (ref 4.0–5.6)

## 2021-09-14 MED ORDER — LOSARTAN POTASSIUM 50 MG PO TABS: 50.0000 mg | ORAL_TABLET | Freq: Two times a day (BID) | ORAL | 2 refills | Status: DC

## 2021-09-14 MED ORDER — CLONIDINE HCL 0.1 MG PO TABS: 0.1000 mg | ORAL_TABLET | Freq: Three times a day (TID) | ORAL | 2 refills | Status: DC

## 2021-09-14 MED ORDER — AMLODIPINE BESYLATE 10 MG PO TABS: 10.0000 mg | ORAL_TABLET | Freq: Every day | ORAL | 2 refills | Status: DC

## 2021-09-14 NOTE — Progress Notes (Signed)
.  Pt presents for chronic care management   

## 2021-09-19 ENCOUNTER — Other Ambulatory Visit: Payer: Self-pay | Admitting: Physical Medicine and Rehabilitation

## 2021-09-19 ENCOUNTER — Other Ambulatory Visit: Payer: Self-pay | Admitting: Family

## 2021-09-19 ENCOUNTER — Other Ambulatory Visit: Payer: Self-pay | Admitting: Adult Health

## 2021-09-19 DIAGNOSIS — R569 Unspecified convulsions: Secondary | ICD-10-CM

## 2021-09-19 MED ORDER — GABAPENTIN 300 MG PO CAPS: 300.0000 mg | ORAL_CAPSULE | Freq: Three times a day (TID) | ORAL | 3 refills | Status: DC

## 2021-09-19 MED ORDER — LEVETIRACETAM 500 MG PO TABS: 500.0000 mg | ORAL_TABLET | Freq: Two times a day (BID) | ORAL | 3 refills | Status: DC

## 2021-09-19 MED ORDER — FOLIC ACID 1 MG PO TABS: 1.0000 mg | ORAL_TABLET | Freq: Every day | ORAL | 3 refills | Status: DC

## 2021-09-19 NOTE — Telephone Encounter (Signed)
Managed per Neurology. Last refill 09/19/2021 by Sula Soda, MD.

## 2021-09-22 ENCOUNTER — Ambulatory Visit: Payer: BC Managed Care – PPO | Admitting: Adult Health

## 2021-09-22 NOTE — Telephone Encounter (Signed)
Will continue with initial note.

## 2021-09-23 ENCOUNTER — Encounter: Payer: BC Managed Care – PPO | Admitting: Physical Medicine and Rehabilitation

## 2021-09-27 DIAGNOSIS — I610 Nontraumatic intracerebral hemorrhage in hemisphere, subcortical: Secondary | ICD-10-CM | POA: Diagnosis not present

## 2021-09-27 DIAGNOSIS — I5042 Chronic combined systolic (congestive) and diastolic (congestive) heart failure: Secondary | ICD-10-CM | POA: Diagnosis not present

## 2021-09-27 DIAGNOSIS — R569 Unspecified convulsions: Secondary | ICD-10-CM | POA: Diagnosis not present

## 2021-10-07 ENCOUNTER — Ambulatory Visit: Payer: BC Managed Care – PPO | Admitting: Cardiovascular Disease

## 2021-10-10 ENCOUNTER — Ambulatory Visit: Payer: BC Managed Care – PPO | Admitting: Physical Medicine and Rehabilitation

## 2021-10-17 ENCOUNTER — Other Ambulatory Visit: Payer: Self-pay | Admitting: Physical Medicine and Rehabilitation

## 2021-10-17 DIAGNOSIS — I10 Essential (primary) hypertension: Secondary | ICD-10-CM

## 2021-10-17 MED ORDER — CLONIDINE HCL 0.1 MG PO TABS: 0.1000 mg | ORAL_TABLET | Freq: Three times a day (TID) | ORAL | 2 refills | Status: DC

## 2021-10-27 DIAGNOSIS — I5042 Chronic combined systolic (congestive) and diastolic (congestive) heart failure: Secondary | ICD-10-CM | POA: Diagnosis not present

## 2021-10-27 DIAGNOSIS — R569 Unspecified convulsions: Secondary | ICD-10-CM | POA: Diagnosis not present

## 2021-10-27 DIAGNOSIS — I610 Nontraumatic intracerebral hemorrhage in hemisphere, subcortical: Secondary | ICD-10-CM | POA: Diagnosis not present

## 2021-10-31 ENCOUNTER — Encounter: Payer: Self-pay | Admitting: Adult Health

## 2021-10-31 ENCOUNTER — Telehealth: Payer: Self-pay | Admitting: Adult Health

## 2021-10-31 ENCOUNTER — Ambulatory Visit: Payer: BC Managed Care – PPO | Admitting: Adult Health

## 2021-10-31 VITALS — BP 112/65 | HR 80 | Ht 70.0 in | Wt 199.2 lb

## 2021-10-31 DIAGNOSIS — G441 Vascular headache, not elsewhere classified: Secondary | ICD-10-CM

## 2021-10-31 DIAGNOSIS — R569 Unspecified convulsions: Secondary | ICD-10-CM | POA: Diagnosis not present

## 2021-10-31 DIAGNOSIS — E785 Hyperlipidemia, unspecified: Secondary | ICD-10-CM | POA: Diagnosis not present

## 2021-10-31 DIAGNOSIS — G8194 Hemiplegia, unspecified affecting left nondominant side: Secondary | ICD-10-CM

## 2021-10-31 DIAGNOSIS — I619 Nontraumatic intracerebral hemorrhage, unspecified: Secondary | ICD-10-CM | POA: Diagnosis not present

## 2021-10-31 MED ORDER — TOPIRAMATE 50 MG PO TABS
50.0000 mg | ORAL_TABLET | Freq: Two times a day (BID) | ORAL | 5 refills | Status: DC
Start: 2021-10-31 — End: 2022-11-27

## 2021-10-31 NOTE — Telephone Encounter (Signed)
Referral for Occupational Therapy sent by Epic to [facility]

## 2021-10-31 NOTE — Patient Instructions (Addendum)
Referral placed to neuro rehab PT/OT  Continue atorvastatin  for secondary stroke prevention  We will check cholesterol levels today  Continue keppra and topamax as prescribed   Continue to follow up with PCP regarding cholesterol and blood pressure management  Maintain strict control of hypertension with blood pressure goal below 130/90 and cholesterol with LDL cholesterol (bad cholesterol) goal below 70 mg/dL.   Signs of a Stroke? Follow the BEFAST method:  Balance Watch for a sudden loss of balance, trouble with coordination or vertigo Eyes Is there a sudden loss of vision in one or both eyes? Or double vision?  Face: Ask the person to smile. Does one side of the face droop or is it numb?  Arms: Ask the person to raise both arms. Does one arm drift downward? Is there weakness or numbness of a leg? Speech: Ask the person to repeat a simple phrase. Does the speech sound slurred/strange? Is the person confused ? Time: If you observe any of these signs, call 911.      Followup in the future with me in 6 months or call earlier if needed       Thank you for coming to see [place] at [facility]. I hope we have been able to provide you high quality care today.  You may receive a patient satisfaction survey over the next few weeks. We would appreciate your feedback and comments so that we may continue to improve ourselves and the health of our patients.

## 2021-10-31 NOTE — Progress Notes (Signed)
[facility] [address] [place]. [name] [address] [phone]       STROKE FOLLOW UP NOTE  [name] Date of Birth:  1965/06/22 Medical Record Number:  [id number]    Primary neurologist: Dr. Leonie Man Reason for Referral: stroke, seizure and headache follow up    SUBJECTIVE:   CHIEF COMPLAINT:  Chief Complaint  Patient presents with   Nontraumatic thalamic hemorrhage    Rm 3, alone, 6 month FU "will see heart specialist next Monday; I feel good; left side of tongue is swollen impairing my speech and face swollen- not sure if stroke related"     HPI:   Update 10/31/2021 JM: Patient returns for follow-up after prior visit 7 months ago regarding history of ICH, seizures and headaches.  He is unaccompanied.  Reports residual left sided weakness and nerve pain. Was previously just in foot, but now going up his whole leg to his hip.  At prior visit with PMR, recommended trialing different medications but he wishes to try to avoid medications if able.  He questions restarting therapies.  Continues to ambulate with a cane, denies any recent falls.  Does mention left side of face and tongue feels swollen over the past 2 weeks, denies any change in diet or medications.  Denies any difficulty breathing or swallowing. Compliant on Keppra, no seizure activity. Has not had any recent headaches, currently on topiramate 50 mg twice daily. On atorvastatin, denies side effects.  Blood pressure well controlled, today 112/65.  Is being seen by cardiology next week for further blood pressure management.  No further concerns at this time.       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  Returns today for follow-up visit unaccompanied.  Overall stable from stroke standpoint without new stroke/TIA symptoms.  Reports residual left-sided weakness with left arm and foot  neuropathic pain and intermittent blurred vision in left periphery bilaterally although improving. Seen by PMR 2 days ago - continues to manage pain. He frustrated by continued pain and is looking at different home options to help with pain such as tens units. Left Shoulder xray showed subluxation and prominent acromioclavicular and glenohumeral degenerative change.  Continues working with PT/OT. Awaiting AFO brace, recently ordered by PMR. Ambulates with quad cane, no recent falls. Compliant on Keppra, denies side effects, no seizure activity noted.  Compliant on topiramate 50mg  qhs for headaches that were present during hospitalization, denies side effects, does report some worsening headaches over the past 2 weeks, reports 2 headaches left frontal area associated with blurred vision in left eye and imbalance (leaning backwards), typically will resolve shortly after taking Tylenol, denies photophobia, phonophobia, or N/V. Describes as dull pain, denies pulsating, pressure or stabbing sensation.  He is routinely followed by Dr. Sabra Heck ophthalmologist but has not had evaluation since worsening headaches with visual issues. Compliant on atorvastatin without side effects.  Blood pressure today 136/75.  No further concerns at this time.  Initial visit 09/01/2020 JM: [name] is being seen for hospital follow-up accompanied by his wife, [name].  Reports residual left-sided weakness, left shoulder pain and left foot pain, right side back and hip pain, and occasional blurred vision in left periphery. Currently working with [facility] PT/OT/SLP.  He ambulates with hemiwalker during therapy sessions otherwise transfers via wheelchair. Seen by PMR (8/2) via telemed visit and currently on Tylenol, Norco, tizanidine, lidocaine patch and gabapentin. Has  f/u visit 8/26 with OV note mentioning trial of Botox.  Continues to experience left foot numbness and pain worse at night -gabapentin helps some.  Denies new stroke/TIA symptoms.   Remains on Keppra 500 mg twice daily tolerating without seizure activity.  Remains on topiramate for headache prophylaxis.  Compliant on atorvastatin 40 mg daily -denies side effects.  Blood pressure today 142/81 -routinely monitors at home and has been stable. Per wife, plans on being seen by urology for voiding difficulties post stroke and current use of tamsulosin.  No further concerns at this time.  Stroke admission 06/09/2020 [name] is a 56 y.o. male with history of hypertension, obesity, cardiomyopathy, hyperlipidemia, CHF admitted on 06/09/2020 for right-sided gaze, right-sided weakness numbness and fall.  Personally reviewed hospitalization pertinent progress notes, lab work and imaging.  Evaluated by Dr. Erlinda Hong for right thalamic ICH with IVH with midline shift and edema likely due to uncontrolled HTN.  BP treated with Cleviprex with adjustments made to home regimen. MRA no vascular malformations. CUS unremarkable.  EF 65 to 70%.  UDS negative.  LDL 78 on atorvastatin 40 mg daily.  A1c 5.5.  Other stroke risk factors include EtOH use, obesity and suspected sleep apnea.  Stroke team signed off on 5/24 but reevaluated on 5/27 due to altered mental status unresponsive to verbal and tactile stimuli, neck jerking to the left and left gaze deviation likely partial complex seizure.  Patient on Depakote for headache prophylaxis with Depakote level 45. CT head stable ICH and IVH with mild hydrocephalus.  EEG and LTM EEG no evidence of seizures.  Placed on Keppra 500 mg twice daily for seizure prophylaxis.  No additional seizures.  Hospital course complicated by sundowning placed on Seroquel, AKI and headaches.  Therapies recommended CIR for residual dense left hemiplegia, right gaze preference, dysphagia, fatigue and visual deficits. CIR admission 5/31-6/29 -significant for left shoulder pain due to mild subluxation, right-sided low back and hip pain and headaches.  PERTINENT IMAGING/LABS  Per  hospitalization 06/09/2020 CT head showed right thalamic ICH with IVH with 4 mm midline shift Repeat CT head no hematoma expansion MRI stable right thalamic ICH with IVH and midline shift MRA no aneurysm or AVM CT repeat x 2 stable right thalamic hemorrhage with mild intraventricular extension. 2D Echo EF 65-70% CUS unremarkable LDL 78 HgbA1c 5.5      ROS:   14 system review of systems performed and negative with exception of those listed in HPI  PMH:  Past Medical History:  Diagnosis Date   Anxiety    Cataract    removed left eye   Chronic combined systolic and diastolic CHF (congestive heart failure) (HCC)    EF [id number]   Diastolic dysfunction    Dyslipidemia    Hyperlipidemia    Hypertension    Neuromuscular disorder (HCC)    neuropathy left foot   Nonischemic dilated cardiomyopathy ([place])    felt secondary to HTN with no ischemia on nuclear stress test 05/2012, EF 25-30%   Seizures ([place])    last seizure 05-2020- on Keppra   Shortness of breath    Stroke ([place]) 05/2020    PSH:  Past Surgical History:  Procedure Laterality Date   COLONOSCOPY WITH PROPOFOL N/A 12/09/2020   Procedure: COLONOSCOPY WITH PROPOFOL;  Surgeon: Yetta Flock, MD;  Location: [facility];  Service: Gastroenterology;  Laterality: N/A;   KNEE ARTHROSCOPY Left 04/13/2016   Procedure: ARTHROSCOPY KNEE WITH DEBRIDEMENT;  Surgeon: Garald Balding, MD;  Location: [facility];  Service: Orthopedics;  Laterality: Left;   KNEE ARTHROSCOPY WITH MEDIAL MENISECTOMY Right 04/13/2016   Procedure: KNEE ARTHROSCOPY WITH MEDIAL MENISECTOMY;  Surgeon: Garald Balding, MD;  Location: [facility];  Service: Orthopedics;  Laterality: LEFT not right knee   NASAL FRACTURE SURGERY     SHOULDER SURGERY Left     Social History:  Social History   Socioeconomic History   Marital status: Married    Spouse name: Not on file   Number of children: Not on file   Years of education:  Not on file   Highest education level: Not on file  Occupational History   Not on file  Tobacco Use   Smoking status: Former    Types: Cigarettes    Passive exposure: Past   Smokeless tobacco: Never  Vaping Use   Vaping Use: Never used  Substance and Sexual Activity   Alcohol use: Not Currently   Drug use: Not Currently    Types: Cocaine, Marijuana, Oxycodone   Sexual activity: Yes  Other Topics Concern   Not on file  Social History Narrative   Not on file   Social Determinants of Health   Financial Resource Strain: Not on file  Food Insecurity: Not on file  Transportation Needs: Not on file  Physical Activity: Not on file  Stress: Not on file  Social Connections: Not on file  Intimate Partner Violence: Not on file    Family History:  Family History  Problem Relation Age of Onset   Hypertension Mother        Passed away when she is 11 secondary to hip surgery complications   CAD Mother    Heart attack Mother    Hypertension Father    Diabetes Brother    Stroke Brother    Heart attack Maternal Grandmother    Colon cancer Neg Hx    Esophageal cancer Neg Hx    Pancreatic cancer Neg Hx    Stomach cancer Neg Hx    Colon polyps Neg Hx     Medications:   Current Outpatient Medications on File Prior to Visit  Medication Sig Dispense Refill   amLODipine (NORVASC) 10 MG tablet Take 1 tablet (10 mg total) by mouth daily. 30 tablet 2   atorvastatin (LIPITOR) 40 MG tablet Take 1 tablet (40 mg total) by mouth daily. 90 tablet 0   baclofen (LIORESAL) 10 MG tablet Take 1 tablet (10 mg total) by mouth at bedtime. 90 each 3   cloNIDine (CATAPRES) 0.1 MG tablet Take 1 tablet (0.1 mg total) by mouth 3 (three) times daily. 90 tablet 2   diclofenac Sodium (VOLTAREN) 1 % GEL Apply 2 g topically 4 (four) times daily. 200 g 0   DULoxetine (CYMBALTA) 20 MG capsule Take 1 capsule (20 mg total) by mouth daily. 90 capsule 3   folic acid (FOLVITE) 1 MG tablet Take 1 tablet (1 mg total)  by mouth daily. 30 tablet 3   gabapentin (NEURONTIN) 300 MG capsule Take 1 capsule (300 mg total) by mouth 3 (three) times daily. 270 capsule 3   labetalol (NORMODYNE) 100 MG tablet Take 1 tablet by mouth twice daily 180 tablet 0   levETIRAcetam (KEPPRA) 500 MG tablet Take 1 tablet (500 mg total) by mouth 2 (two) times daily. 180 tablet 3   losartan (COZAAR) 50 MG tablet Take 1 tablet (50 mg total) by mouth 2 (two) times daily. 60 tablet 2   Multiple Vitamins-Minerals (CERTAVITE/ANTIOXIDANTS)  TABS Take 1 tablet by mouth daily. 30 tablet 0   tadalafil (CIALIS) 5 MG tablet Take 5 mg by mouth every morning.     tamsulosin (FLOMAX) 0.4 MG CAPS capsule TAKE 1 CAPSULE BY MOUTH ONCE DAILY AFTER SUPPER 30 capsule 0   thiamine 100 MG tablet Take 1 tablet (100 mg total) by mouth daily. 30 tablet 0   tiZANidine (ZANAFLEX) 4 MG tablet Take 1 tablet (4 mg total) by mouth at bedtime as needed for muscle spasms. 30 tablet 0   topiramate (TOPAMAX) 50 MG tablet Take 50 mg by mouth 2 (two) times daily.     No current facility-administered medications on file prior to visit.    Allergies:  No Known Allergies    OBJECTIVE:  Physical Exam  Vitals:   10/31/21 1442  BP: 112/65  Pulse: 80  Weight: 199 lb 3.2 oz (90.4 kg)  Height: 5\' 10"  (1.778 m)   Body mass index is 28.58 kg/m. No results found.  General: well developed, well nourished, very pleasant middle-aged African-American male, seated, in no evident distress Head: head normocephalic and atraumatic.   Neck: supple with no carotid or supraclavicular bruits Cardiovascular: regular rate and rhythm, no murmurs Musculoskeletal: no deformity; pitting edema L>R chronic  Skin:  no rash/petichiae Vascular:  Normal pulses all extremities   Neurologic Exam Mental Status: Awake and fully alert.  Fluent speech and language.  Oriented to place and time. Recent memory mildly impaired and remote memory intact. Attention span, concentration and fund of  knowledge appropriate during visit. Mood and affect appropriate.  Cranial Nerves: Pupils equal, briskly reactive to light. Extraocular movements full without nystagmus. Visual fields full to confrontation. Hearing intact.  Mild left lower facial weakness.  Tongue, palate moves normally and symmetrically.  Motor: Normal strength, bulk and tone right upper and lower extremity LUE: 4-/5 deltoid and handgrip, 4/5elbow flexion and extension, increased tone throughout LLE: 3/5 hip flexor, 4+/5 KF and KE, 4/5 ADF and APF Sensory.:  decreased sensation LLE compared to RLE, intact BUE Coordination: Rapid alternating movements normal in all extremities on right side. Finger-to-nose and heel-to-shin performed accurately on right side. Gait and Station: stands from seated position with mild difficulty.  Gait demonstrates hemiplegic circumduction gait with use of quad cane.  Tandem walk and heel toe not attempted Reflexes: Brisk on left side, 1+ right side.  Toes downgoing.          ASSESSMENT: [name] is a 56 y.o. year old male with right thalamic ICH with IVH on 06/09/2020 like secondary to uncontrolled HTN with seizure activity on 5/27. Vascular risk factors include HTN, HLD, cardiomyopathy, CHF, and etoh use .     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 Referral placed to neuro rehab PT/OT as requested. Continued use of cane at all times unless otherwise instructed. Low suspicion facial swelling symptoms stroke related, discussed possible allergy. Advised to f/u with PCP. Advised if he started to have difficulty swallowing or breathing, he should be calling 911 immediately for emergent evaluation Continue to follow with PMR as scheduled for ongoing left-sided neuropathic pain  Discussed secondary stroke prevention measures and importance of close PCP follow up for aggressive stroke risk  factor management including HTN with BP goal<130/90 and HLD with LDL goal<70.  Recheck lipid panel today. I have gone over the pathophysiology of stroke, warning signs and symptoms, risk  factors and their management in some detail with instructions to go to the closest emergency room for symptoms of concern.  Chronic headaches:  No recent headaches, previously associated with blurred vision and imbalance Continue topiramate 50 mg twice daily -refill provided Headache present initially after ICH but resolved shortly after discharge. Reoccurrence around 02/2021.  Repeat MR brain 03/2021 no new or acute findings, noted reduction in size of prior ICH   Seizure, post stroke: continue keppra 500mg  twice daily for seizure prevention -refill provided by PMR     Follow up in 6 months or call earlier if needed    CC:  PCP: Camillia Herter, NP    I spent 36 minutes of face-to-face and non-face-to-face time with patient.  This included previsit chart review, lab review, study review, order entry, electronic health record documentation, patient education and discussion regarding above diagnoses and treatment plan and answered all the questions to patient's satisfaction  Frann Rider, [facility]  [facility] [address] [place], [place] [address]  Phone [phone] Fax [phone] Note: This document was prepared with digital dictation and possible smart phrase technology. Any transcriptional errors that result from this process are unintentional.

## 2021-11-01 ENCOUNTER — Other Ambulatory Visit: Payer: Self-pay | Admitting: Adult Health

## 2021-11-01 ENCOUNTER — Telehealth: Payer: Self-pay

## 2021-11-01 DIAGNOSIS — E782 Mixed hyperlipidemia: Secondary | ICD-10-CM

## 2021-11-01 LAB — LIPID PANEL
Chol/HDL Ratio: 2.8 ratio (ref 0.0–5.0)
Cholesterol, Total: 168 mg/dL (ref 100–199)
HDL: 59 mg/dL (ref >39)
LDL Chol Calc (NIH): 86 mg/dL (ref 0–99)
Triglycerides: 132 mg/dL (ref 0–149)
VLDL Cholesterol Cal: 23 mg/dL (ref 5–40)

## 2021-11-01 MED ORDER — ATORVASTATIN CALCIUM 80 MG PO TABS: 80.0000 mg | ORAL_TABLET | Freq: Every day | ORAL | 1 refills | Status: DC

## 2021-11-01 NOTE — Telephone Encounter (Signed)
Contacted pt, informed him of lab results. He verbally understood and was appreciative. Also mention updating his DPR, advised he will need to come in a complete the form as I am unable to do it over the phone, he understood and will be in to update.

## 2021-11-01 NOTE — Telephone Encounter (Signed)
-----   Message from Frann Rider, NP sent at 11/01/2021  7:08 AM EDT ----- Pt requested telephone call if any meds needed changing  Please advise patient that recent cholesterol levels showed LDL or bad cholesterol at 86 with goal of less than 70. Recommend increasing atorvastatin from 40mg  to 80mg  daily. Please ensure pt f/u with PCP in the next 3-4 months for repeat levels and ongoing monitoring of cholesterol levels and rx'ing of atorvastatin. Thank you.

## 2021-11-04 ENCOUNTER — Other Ambulatory Visit: Payer: Self-pay | Admitting: Family

## 2021-11-04 DIAGNOSIS — I10 Essential (primary) hypertension: Secondary | ICD-10-CM

## 2021-11-04 NOTE — Telephone Encounter (Signed)
Requested Prescriptions  Pending Prescriptions Disp Refills  . labetalol (NORMODYNE) 100 MG tablet [Pharmacy Med Name: Labetalol HCl 100 MG Oral Tablet] 180 tablet 1    Sig: Take 1 tablet by mouth twice daily     Cardiovascular:  Beta Blockers Passed - 11/04/2021 12:15 PM      Passed - Last BP in normal range    BP Readings from Last 1 Encounters:  10/31/21 112/65         Passed - Last Heart Rate in normal range    Pulse Readings from Last 1 Encounters:  10/31/21 80         Passed - Valid encounter within last 6 months    Recent Outpatient Visits          1 month ago Essential (primary) hypertension   Primary Care at [facility], Amy J, NP   5 months ago Essential (primary) hypertension   Primary Care at [facility], Amy J, NP   9 months ago Encounter for completion of form with patient   Primary Care at [facility], Amy J, NP   1 year ago Annual physical exam   Primary Care at [facility], Theresa, NP   1 year ago Encounter to establish care   Primary Care at [facility], Flonnie Hailstone, NP      Future Appointments            In 3 days Nahser, Wonda Cheng, MD [place]. [facility], [facility]

## 2021-11-07 ENCOUNTER — Ambulatory Visit: Payer: BC Managed Care – PPO | Attending: Cardiovascular Disease | Admitting: Cardiovascular Disease

## 2021-11-07 ENCOUNTER — Encounter: Payer: Self-pay | Admitting: Cardiovascular Disease

## 2021-11-07 VITALS — BP 117/71 | HR 97 | Ht 70.0 in | Wt 195.0 lb

## 2021-11-07 DIAGNOSIS — I10 Essential (primary) hypertension: Secondary | ICD-10-CM

## 2021-11-07 DIAGNOSIS — I517 Cardiomegaly: Secondary | ICD-10-CM | POA: Diagnosis not present

## 2021-11-07 DIAGNOSIS — I5032 Chronic diastolic (congestive) heart failure: Secondary | ICD-10-CM | POA: Insufficient documentation

## 2021-11-07 NOTE — Patient Instructions (Signed)
Medication Instructions:  Your physician recommends that you continue on your current medications as directed. Please refer to the Current Medication list given to you today.  *If you need a refill on your cardiac medications before your next appointment, please call your pharmacy*  Lab Work: If you have labs (blood work) drawn today and your tests are completely normal, you will receive your results only by: [place] (if you have MyChart) OR A paper copy in the mail If you have any lab test that is abnormal or we need to change your treatment, we will call you to review the results.  Testing/Procedures: None ordered today.  Follow-Up: At [facility], you and your health needs are our priority.  As part of our continuing mission to provide you with exceptional heart care, we have created designated Provider Care Teams.  These Care Teams include your primary Cardiologist (physician) and Advanced Practice Providers (APPs -  Physician Assistants and Nurse Practitioners) who all work together to provide you with the care you need, when you need it.  We recommend signing up for the patient portal called "MyChart".  Sign up information is provided on this After Visit Summary.  MyChart is used to connect with patients for Virtual Visits (Telemedicine).  Patients are able to view lab/test results, encounter notes, upcoming appointments, etc.  Non-urgent messages can be sent to your provider as well.   To learn more about what you can do with MyChart, go to [email or web address].    Your next appointment:   6 month(s)  The format for your next appointment:   In Person  Provider:   Mertie Moores, MD     Important Information About Sugar

## 2021-11-07 NOTE — Progress Notes (Signed)
Cardiology Office Note:    Date:  11/07/2021   ID:  [name], DOB 1965/06/26, MRN [id number]  PCP:  Rema Fendt, NP   [facility] Providers Cardiologist:  previously Mayford Knife / Bacilio Abascal      Referring MD: Rema Fendt, NP   Chief Complaint  Patient presents with   Hypertension        Congestive Heart Failure          History of Present Illness:    [name] is a 56 y.o. male with a hx of  HTN.   HLD.  , CHF , hx of stroke ( March , 2021)   No cp or dyspnea ,  walks in the house  Could walk a mile if needed.   Works out  Has had some issues with medication compliance  Left sided weakness from his stroke  Going to restart PT soon   He has been much more compliant with his medications recently.  He says active as he can be considering his stroke.  His diet is improved.        Past Medical History:  Diagnosis Date   Anxiety    Cataract    removed left eye   Chronic combined systolic and diastolic CHF (congestive heart failure) (HCC)    EF 25-30%   Diastolic dysfunction    Dyslipidemia    Hyperlipidemia    Hypertension    Neuromuscular disorder (HCC)    neuropathy left foot   Nonischemic dilated cardiomyopathy (HCC)    felt secondary to HTN with no ischemia on nuclear stress test 05/2012, EF 25-30%   Seizures (HCC)    last seizure 05-2020- on Keppra   Shortness of breath    Stroke (HCC) 05/2020    Past Surgical History:  Procedure Laterality Date   COLONOSCOPY WITH PROPOFOL N/A 12/09/2020   Procedure: COLONOSCOPY WITH PROPOFOL;  Surgeon: Benancio Deeds, MD;  Location: [facility];  Service: Gastroenterology;  Laterality: N/A;   KNEE ARTHROSCOPY Left 04/13/2016   Procedure: ARTHROSCOPY KNEE WITH DEBRIDEMENT;  Surgeon: Valeria Batman, MD;  Location: [facility];  Service: Orthopedics;  Laterality: Left;   KNEE ARTHROSCOPY WITH MEDIAL MENISECTOMY Right 04/13/2016   Procedure: KNEE ARTHROSCOPY WITH MEDIAL  MENISECTOMY;  Surgeon: Valeria Batman, MD;  Location: [facility];  Service: Orthopedics;  Laterality: LEFT not right knee   NASAL FRACTURE SURGERY     SHOULDER SURGERY Left     Current Medications: Current Meds  Medication Sig   amLODipine (NORVASC) 10 MG tablet Take 1 tablet (10 mg total) by mouth daily.   atorvastatin (LIPITOR) 80 MG tablet Take 1 tablet (80 mg total) by mouth daily.   baclofen (LIORESAL) 10 MG tablet Take 1 tablet (10 mg total) by mouth at bedtime.   cloNIDine (CATAPRES) 0.1 MG tablet Take 1 tablet (0.1 mg total) by mouth 3 (three) times daily.   diclofenac Sodium (VOLTAREN) 1 % GEL Apply 2 g topically 4 (four) times daily.   DULoxetine (CYMBALTA) 20 MG capsule Take 1 capsule (20 mg total) by mouth daily.   folic acid (FOLVITE) 1 MG tablet Take 1 tablet (1 mg total) by mouth daily.   gabapentin (NEURONTIN) 300 MG capsule Take 1 capsule (300 mg total) by mouth 3 (three) times daily.   labetalol (NORMODYNE) 100 MG tablet Take 1 tablet by mouth twice daily   levETIRAcetam (KEPPRA) 500 MG tablet Take 1 tablet (500 mg total) by  mouth 2 (two) times daily.   losartan (COZAAR) 50 MG tablet Take 1 tablet (50 mg total) by mouth 2 (two) times daily.   Multiple Vitamins-Minerals (CERTAVITE/ANTIOXIDANTS) TABS Take 1 tablet by mouth daily.   tadalafil (CIALIS) 5 MG tablet Take 5 mg by mouth every morning.   tamsulosin (FLOMAX) 0.4 MG CAPS capsule TAKE 1 CAPSULE BY MOUTH ONCE DAILY AFTER SUPPER   thiamine 100 MG tablet Take 1 tablet (100 mg total) by mouth daily.   tiZANidine (ZANAFLEX) 4 MG tablet Take 1 tablet (4 mg total) by mouth at bedtime as needed for muscle spasms.   topiramate (TOPAMAX) 50 MG tablet Take 1 tablet (50 mg total) by mouth 2 (two) times daily.     Allergies:   Patient has no known allergies.   Social History   Socioeconomic History   Marital status: Married    Spouse name: Not on file   Number of children: Not on file   Years of  education: Not on file   Highest education level: Not on file  Occupational History   Not on file  Tobacco Use   Smoking status: Former    Types: Cigarettes    Passive exposure: Past   Smokeless tobacco: Never  Vaping Use   Vaping Use: Never used  Substance and Sexual Activity   Alcohol use: Not Currently   Drug use: Not Currently    Types: Cocaine, Marijuana, Oxycodone   Sexual activity: Yes  Other Topics Concern   Not on file  Social History Narrative   Not on file   Social Determinants of Health   Financial Resource Strain: Not on file  Food Insecurity: Not on file  Transportation Needs: Not on file  Physical Activity: Not on file  Stress: Not on file  Social Connections: Not on file     Family History: The patient's family history includes CAD in his mother; Diabetes in his brother; Heart attack in his maternal grandmother and mother; Hypertension in his father and mother; Stroke in his brother. There is no history of Colon cancer, Esophageal cancer, Pancreatic cancer, Stomach cancer, or Colon polyps.  ROS:   Please see the history of present illness.     All other systems reviewed and are negative.  EKGs/Labs/Other Studies Reviewed:    The following studies were reviewed today:   EKG: November 07, 2021: Normal sinus rhythm at 91.  Moderate voltage criteria for LVH.  Associated ST and T wave changes.  Recent Labs: 05/11/2021: BUN 16; Creatinine, Ser 1.10; Potassium 3.9; Sodium 144  Recent Lipid Panel    Component Value Date/Time   CHOL 168 10/31/2021 1520   TRIG 132 10/31/2021 1520   HDL 59 10/31/2021 1520   CHOLHDL 2.8 10/31/2021 1520   CHOLHDL 3.8 06/09/2020 1809   VLDL 66 (H) 06/09/2020 1809   LDLCALC 86 10/31/2021 1520     Risk Assessment/Calculations:                Physical Exam:    VS:  BP 117/71   Pulse 97   Ht 5\' 10"  (1.778 m)   Wt 195 lb (88.5 kg)   SpO2 97%   BMI 27.98 kg/m     Wt Readings from Last 3 Encounters:  11/07/21 195  lb (88.5 kg)  10/31/21 199 lb 3.2 oz (90.4 kg)  09/14/21 205 lb (93 kg)     GEN:  Well nourished, well developed in no acute distress HEENT: Normal NECK: No JVD; No carotid bruits  LYMPHATICS: No lymphadenopathy CARDIAC: RRR, no murmurs, rubs, gallops RESPIRATORY:  Clear to auscultation without rales, wheezing or rhonchi  ABDOMEN: Soft, non-tender, non-distended MUSCULOSKELETAL:  trace edema of left lower leg . No deformity  SKIN: Warm and dry NEUROLOGIC:  Alert and oriented x 3 PSYCHIATRIC:  Normal affect   ASSESSMENT:    1. Essential hypertension   2. Chronic diastolic CHF (congestive heart failure) (HCC)   3. Left ventricular hypertrophy    PLAN:       History of congestive heart failure.  His most recent echocardiogram reveals normal left ventricular systolic function.  Echocardiogram from May, 2022 reveals LVEF of 65 to 70%.  He has severe left ventricular hypertrophy and assessment of the diastolic function was not possible.  He has trivial mitral regurgitation   2.  Stroke:   slowly improving by his report .   3.. HTN:   BP is well controlled .  Cont current meds           Medication Adjustments/Labs and Tests Ordered: Current medicines are reviewed at length with the patient today.  Concerns regarding medicines are outlined above.  Orders Placed This Encounter  Procedures   EKG 12-Lead   No orders of the defined types were placed in this encounter.   Patient Instructions  Medication Instructions:  Your physician recommends that you continue on your current medications as directed. Please refer to the Current Medication list given to you today.  *If you need a refill on your cardiac medications before your next appointment, please call your pharmacy*  Lab Work: If you have labs (blood work) drawn today and your tests are completely normal, you will receive your results only by: MyChart Message (if you have MyChart) OR A paper copy in the mail If you have  any lab test that is abnormal or we need to change your treatment, we will call you to review the results.  Testing/Procedures: None ordered today.  Follow-Up: At [facility], you and your health needs are our priority.  As part of our continuing mission to provide you with exceptional heart care, we have created designated Provider Care Teams.  These Care Teams include your primary Cardiologist (physician) and Advanced Practice Providers (APPs -  Physician Assistants and Nurse Practitioners) who all work together to provide you with the care you need, when you need it.  We recommend signing up for the patient portal called "MyChart".  Sign up information is provided on this After Visit Summary.  MyChart is used to connect with patients for Virtual Visits (Telemedicine).  Patients are able to view lab/test results, encounter notes, upcoming appointments, etc.  Non-urgent messages can be sent to your provider as well.   To learn more about what you can do with MyChart, go to [email or web address].    Your next appointment:   6 month(s)  The format for your next appointment:   In Person  Provider:   Kristeen Miss, MD     Important Information About Sugar         Signed, Kristeen Miss, MD  11/07/2021 5:06 PM     [facility]

## 2021-11-08 ENCOUNTER — Encounter: Payer: BC Managed Care – PPO | Admitting: Physical Medicine and Rehabilitation

## 2021-11-09 ENCOUNTER — Ambulatory Visit: Payer: BC Managed Care – PPO | Attending: Family | Admitting: Physical Therapy

## 2021-11-09 ENCOUNTER — Ambulatory Visit: Payer: BC Managed Care – PPO | Admitting: Occupational Therapy

## 2021-11-09 ENCOUNTER — Encounter: Payer: Self-pay | Admitting: Physical Therapy

## 2021-11-09 VITALS — BP 101/63 | HR 65

## 2021-11-09 DIAGNOSIS — I619 Nontraumatic intracerebral hemorrhage, unspecified: Secondary | ICD-10-CM | POA: Diagnosis not present

## 2021-11-09 DIAGNOSIS — I69154 Hemiplegia and hemiparesis following nontraumatic intracerebral hemorrhage affecting left non-dominant side: Secondary | ICD-10-CM | POA: Diagnosis not present

## 2021-11-09 DIAGNOSIS — G8929 Other chronic pain: Secondary | ICD-10-CM | POA: Insufficient documentation

## 2021-11-09 DIAGNOSIS — G8194 Hemiplegia, unspecified affecting left nondominant side: Secondary | ICD-10-CM | POA: Insufficient documentation

## 2021-11-09 DIAGNOSIS — I69354 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-dominant side: Secondary | ICD-10-CM | POA: Insufficient documentation

## 2021-11-09 DIAGNOSIS — R2681 Unsteadiness on feet: Secondary | ICD-10-CM | POA: Insufficient documentation

## 2021-11-09 DIAGNOSIS — M25512 Pain in left shoulder: Secondary | ICD-10-CM | POA: Insufficient documentation

## 2021-11-09 DIAGNOSIS — R278 Other lack of coordination: Secondary | ICD-10-CM | POA: Insufficient documentation

## 2021-11-09 DIAGNOSIS — R208 Other disturbances of skin sensation: Secondary | ICD-10-CM | POA: Insufficient documentation

## 2021-11-09 DIAGNOSIS — M6281 Muscle weakness (generalized): Secondary | ICD-10-CM | POA: Diagnosis not present

## 2021-11-09 DIAGNOSIS — R52 Pain, unspecified: Secondary | ICD-10-CM | POA: Diagnosis not present

## 2021-11-09 DIAGNOSIS — R2689 Other abnormalities of gait and mobility: Secondary | ICD-10-CM | POA: Diagnosis not present

## 2021-11-09 NOTE — Therapy (Signed)
OUTPATIENT PHYSICAL THERAPY NEURO EVALUATION   Patient Name: [name] MRN: [id number] DOB:11/25/1965, 56 y.o., male Today's Date: 11/09/2021   PCP: Rema Fendt, NP REFERRING PROVIDER: Ihor Austin, NP    11/09/21 0857  PT Visits / Re-Eval  Visit Number 1  Number of Visits 9 (eval + 8)  Date for PT Re-Evaluation 01/13/22  Authorization  Authorization Type [employer] 30 visits PT/OT combined - If seen for PT & OT on the same it constitutes one visit, notes say 8 used, pt states 6 remaining, when counted from start of calendar year 16 left.  Authorization - Visit Number  (20-last recorded authorized visit 2023)  Authorization - Number of Visits 30  PT Time Calculation  PT Start Time [phone]  PT Stop Time 0935  PT Time Calculation (min) 49 min  PT - End of Session  Equipment Utilized During Treatment Gait belt  Activity Tolerance Patient tolerated treatment well  Behavior During Therapy WFL for tasks assessed/performed     Past Medical History:  Diagnosis Date   Anxiety    Cataract    removed left eye   Chronic combined systolic and diastolic CHF (congestive heart failure) (HCC)    EF 25-30%   Diastolic dysfunction    Dyslipidemia    Hyperlipidemia    Hypertension    Neuromuscular disorder (HCC)    neuropathy left foot   Nonischemic dilated cardiomyopathy (HCC)    felt secondary to HTN with no ischemia on nuclear stress test 05/2012, EF 25-30%   Seizures (HCC)    last seizure 05-2020- on Keppra   Shortness of breath    Stroke (HCC) 05/2020   Past Surgical History:  Procedure Laterality Date   COLONOSCOPY WITH PROPOFOL N/A 12/09/2020   Procedure: COLONOSCOPY WITH PROPOFOL;  Surgeon: Benancio Deeds, MD;  Location: [facility];  Service: Gastroenterology;  Laterality: N/A;   KNEE ARTHROSCOPY Left 04/13/2016   Procedure: ARTHROSCOPY KNEE WITH DEBRIDEMENT;  Surgeon: Valeria Batman, MD;  Location: [facility];  Service: Orthopedics;   Laterality: Left;   KNEE ARTHROSCOPY WITH MEDIAL MENISECTOMY Right 04/13/2016   Procedure: KNEE ARTHROSCOPY WITH MEDIAL MENISECTOMY;  Surgeon: Valeria Batman, MD;  Location: [facility];  Service: Orthopedics;  Laterality: LEFT not right knee   NASAL FRACTURE SURGERY     SHOULDER SURGERY Left    Patient Active Problem List   Diagnosis Date Noted   Chronic diastolic CHF (congestive heart failure) (HCC) 11/07/2021   Left ventricular hypertrophy 11/07/2021   Colon cancer screening    Left hemiplegia (HCC) 07/02/2020   Cognitive deficit due to recent stroke 07/02/2020   Dysphagia, post-stroke    Vascular headache    Acute blood loss anemia    AKI (acute kidney injury) (HCC)    Essential hypertension    Seizures (HCC)    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   ICH (intracerebral hemorrhage) (HCC) 06/09/2020   Sprain of anterior talofibular ligament of left ankle 01/15/2020   Hyperlipidemia, on Lipitor 04/01/2017   Alcohol use disorder, mild, abuse 06/30/2016   Morbid obesity (HCC) 06/30/2016   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   Chronic combined systolic and diastolic CHF (congestive heart failure) (HCC) 11/08/2012   HTN (hypertension) 05/25/2012    ONSET DATE: 10/31/2021   REFERRING DIAG: I61.9 (ICD-10-CM) - Nontraumatic thalamic hemorrhage (HCC) G81.94 (ICD-10-CM) - Left hemiparesis (HCC)   THERAPY DIAG:  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  He states his neuropathy has progressed in both his left arm and leg up past the knee and elbow.  He states he is more stiff in the morning and the neuropathy is also worse at that time. Pt accompanied by: self  PERTINENT HISTORY: HTN, Noischemic dilated cardiomyopathy, chronic combined CHF, ETOH, hyperlipidemia, ICH, seizures, CVA 06/09/2020  PAIN:  Are you having pain? No-wouldn't consider neuropathy painful  PRECAUTIONS: Fall  WEIGHT BEARING RESTRICTIONS No  FALLS: Has patient fallen in last 6 months? Yes. Number of falls 1-he was leaving from the living room going to the kitchen, he lost his balance backwards.  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 Expected reduction in size of hemorrhage compared to 06/19/20. - No acute or new findings.  COGNITION: Overall cognitive status: Impaired and No family/caregiver present to determine baseline cognitive functioning   SENSATION: Light touch: Impaired  and can only perceive light "pressure" but unsure where on left LE and identifies incorrectly  COORDINATION: LE RAMS:  impaired LLE Heel-to-shin:  LLE slow and deliberate  EDEMA:  Mild ankle edema in LLE, non-pitting  MUSCLE TONE: LLE spasticity noted during ambulation, not during formal testing  POSTURE: forward head  LOWER EXTREMITY ROM:     Active  Right Eval Left Eval  Hip flexion [facility]   Hip extension    Hip abduction " "  Hip adduction " "  Hip internal rotation    Hip external rotation    Knee flexion " "  Knee extension " "  Ankle dorsiflexion " "  Ankle plantarflexion    Ankle inversion    Ankle eversion     (Blank rows = not tested)  LOWER EXTREMITY MMT:    MMT Right Eval Left Eval  Hip flexion 4+/5 3+/5  Hip extension    Hip abduction 4/5 3+/5  Hip adduction 4+/5 3+/5  Hip internal rotation    Hip external rotation    Knee flexion 5/5 4-/5  Knee extension 5/5 4+/5  Ankle dorsiflexion 5/5 3-/5  Ankle plantarflexion    Ankle inversion    Ankle eversion    (Blank rows = not tested)  BED MOBILITY:  Sit to supine Complete Independence Supine to sit Complete Independence Pt reports independence.  TRANSFERS: Assistive device utilized: Lobbyist  Sit to stand: SBA Stand to sit: SBA Chair to chair: SBA and CGA Floor: Complete Independence-pt states when he fell recently he got himself out of the floor, cites prior transfer training from prior episode of care.  GAIT: Gait pattern: decreased hip/knee flexion- Left, decreased ankle dorsiflexion- Left, decreased trunk rotation, wide BOS, abducted- Left, and poor foot clearance- Left Distance walked: various clinic distances Assistive device utilized: Quad cane small base Level of assistance: SBA and CGA Comments: Pt demonstrates some ataxia with initiation of steps.  FUNCTIONAL TESTs:  Timed up and go (TUG): 23.44 sec, LOB when turning to sit 10 meter walk test: 21.41 sec w/ SBQC =  0.47 m/sec OR 1.54 ft/sec  PATIENT SURVEYS:  FOTO not applicable due to chronicity of CVA  TODAY'S TREATMENT:  N/A   PATIENT EDUCATION: Education details: PT POC, assessments used and to be used, and goals to be set. Person educated: Patient Education method: Explanation Education comprehension: verbalized understanding and needs further education   HOME EXERCISE PROGRAM: Pt requesting review of  prior:  [id number]    GOALS: Goals reviewed with patient? Yes  SHORT TERM GOALS: Target date: 12/16/2021  Pt will be independent with strength and balance HEP to promote maintenance of gains outside the clinic.  Baseline:  To be reviewed and modified. Goal status: INITIAL  2.  Pt will demonstrate TUG of </=19 seconds in order to decrease risk of falls and improve functional mobility using LRAD. Baseline: 23.44 sec w/ SBQC (10/18) Goal status: INITIAL  3.  Pt will demonstrate a gait speed of >/=1.84 feet/sec using LRAD in order to decrease risk for falls. Baseline: 1.54 ft/sec SBQC Goal status: INITIAL  4.  BERG to be assessed w/ LTG set as appropriate. Baseline: To be assessed. Goal status: INITIAL  5.  Pt will ambulate >/=345' w/ SBQC at no more than SBA level over level, indoor surfaces to improve safety with household and limited community ambulation. Baseline: various clinic distances SBA/CGA w/ SBQC Goal status: INITIAL  LONG TERM GOALS: Target date: 01/13/2022  Pt to be independent with advance strength and balance HEP. Baseline: To be modified. Goal status: INITIAL  2.  Pt will demonstrate TUG of </=15 seconds in order to decrease risk of falls and improve functional mobility using LRAD. Baseline: 23.44 sec w/ SBQC Goal status: INITIAL  3.  Pt will demonstrate a gait speed of >/=2.14 feet/sec using LRAD in order to decrease risk for falls. Baseline: 1.54 ft/sec SBQC Goal status: INITIAL  4.  BERG to be assessed w/ goal set as appropriate. Baseline: To be assessed. Goal status: INITIAL  5.   Pt will ambulate >/= 460' w/ LRAD ModI over level, indoor surfaces to improve safety with household and limited community ambulation. Baseline: various clinic distances SBA/CGA w/ SBQC Goal status: INITIAL  ASSESSMENT:  CLINICAL IMPRESSION: Patient is a 56 y.o. male  who was seen today for physical therapy evaluation and treatment for chronic left hemiparesis related to  prior CVA in May 2022 (06/09/2020).  Pt has a significant PMH of HTN, Noischemic dilated cardiomyopathy, chronic combined CHF, ETOH, hyperlipidemia, ICH, and seizures.  Identified impairments include impaired left LE sensation and weakness, ataxic gait and impaired LLE coordination, mild LE edema, left LE spasticity, forward head posture, reliance on SBQC for ambulation and transfers, and decreased gait speed.  Evaluation via the following assessment tools: TUG and 5xSTS indicate fall risk.  Further assessment of fall risk and static balance to be performed next session with the BERG.  He would benefit from skilled PT to address impairments as noted and progress towards long term goals.  OBJECTIVE IMPAIRMENTS Abnormal gait, decreased activity tolerance, decreased balance, decreased coordination, decreased endurance, decreased mobility, difficulty walking, decreased strength, decreased safety awareness, increased edema, impaired sensation, impaired tone, improper body mechanics, and postural dysfunction.   ACTIVITY LIMITATIONS carrying, lifting, stairs, transfers, and locomotion level  PARTICIPATION LIMITATIONS: community activity and occupation  PERSONAL FACTORS Age, Fitness, Past/current experiences, Time since onset of injury/illness/exacerbation, and 1-2 comorbidities: HTN, chronic CHF/cardiomyopathy  are also affecting patient's functional outcome.   REHAB POTENTIAL: Fair See personal factors  CLINICAL DECISION MAKING: Stable/uncomplicated  EVALUATION COMPLEXITY: Low  PLAN: PT FREQUENCY: 1x/week  PT DURATION: 8 weeks  PLANNED INTERVENTIONS: Therapeutic exercises, Therapeutic activity, Neuromuscular re-education, Balance training, Gait training, Patient/Family education, Self Care, Joint mobilization, Stair training, Vestibular training, Orthotic/Fit training, DME instructions, Electrical stimulation, Manual therapy, and Re-evaluation  PLAN FOR NEXT SESSION: Further assess gait-pt would like  to get down to [facility] (he remains uninterested in [facility]), BERG, and review existing HEP from prior episode of care.  Pt may also do well to review floor transfers/fall recovery.   Sadie Haber, PT, DPT 11/09/2021, 9:35 AM

## 2021-11-13 ENCOUNTER — Other Ambulatory Visit: Payer: Self-pay

## 2021-11-15 NOTE — Therapy (Signed)
OUTPATIENT OCCUPATIONAL THERAPY NEURO EVALUATION  Patient Name: [name] MRN: [id number] DOB:1965/11/14, 56 y.o., male Today's Date: 11/16/2021  PCP: Rema Fendt, NP REFERRING PROVIDER: Ihor Austin, NP   OT End of Session - 11/16/21 1232     Visit Number 1    Number of Visits 9    Date for OT Re-Evaluation 01/25/21    Authorization Type BC[employer]    Authorization Time Period VL: PT/OT combined 30 (9 visits remaining with eval); pt has previously used 14 visits;    Authorization - Visit Number 1    Authorization - Number of Visits 9    OT Start Time 1022    OT Stop Time 1100    OT Time Calculation (min) 38 min    Activity Tolerance Patient tolerated treatment well    Behavior During Therapy WFL for tasks assessed/performed             Past Medical History:  Diagnosis Date   Anxiety    Cataract    removed left eye   Chronic combined systolic and diastolic CHF (congestive heart failure) (HCC)    EF 25-30%   Diastolic dysfunction    Dyslipidemia    Hyperlipidemia    Hypertension    Neuromuscular disorder (HCC)    neuropathy left foot   Nonischemic dilated cardiomyopathy (HCC)    felt secondary to HTN with no ischemia on nuclear stress test 05/2012, EF 25-30%   Seizures (HCC)    last seizure 05-2020- on Keppra   Shortness of breath    Stroke (HCC) 05/2020   Past Surgical History:  Procedure Laterality Date   COLONOSCOPY WITH PROPOFOL N/A 12/09/2020   Procedure: COLONOSCOPY WITH PROPOFOL;  Surgeon: Benancio Deeds, MD;  Location: [facility];  Service: Gastroenterology;  Laterality: N/A;   KNEE ARTHROSCOPY Left 04/13/2016   Procedure: ARTHROSCOPY KNEE WITH DEBRIDEMENT;  Surgeon: Valeria Batman, MD;  Location: [facility];  Service: Orthopedics;  Laterality: Left;   KNEE ARTHROSCOPY WITH MEDIAL MENISECTOMY Right 04/13/2016   Procedure: KNEE ARTHROSCOPY WITH MEDIAL MENISECTOMY;  Surgeon: Valeria Batman, MD;  Location: [facility];  Service: Orthopedics;  Laterality: LEFT not right knee   NASAL FRACTURE SURGERY     SHOULDER SURGERY Left    Patient Active Problem List   Diagnosis Date Noted   Chronic diastolic CHF (congestive heart failure) (HCC) 11/07/2021   Left ventricular hypertrophy 11/07/2021   Colon cancer screening    Left hemiplegia (HCC) 07/02/2020   Cognitive deficit due to recent stroke 07/02/2020   Dysphagia, post-stroke    Vascular headache    Acute blood loss anemia    AKI (acute kidney injury) (HCC)    Essential hypertension    Seizures (HCC)    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   ICH (intracerebral hemorrhage) (HCC) 06/09/2020   Sprain of anterior talofibular ligament of left ankle 01/15/2020   Hyperlipidemia, on Lipitor 04/01/2017   Alcohol use disorder, mild, abuse 06/30/2016   Morbid obesity (HCC) 06/30/2016   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   Chronic combined systolic and diastolic CHF (congestive heart failure) (HCC) 11/08/2012   HTN (hypertension) 05/25/2012    ONSET DATE: 06/08/20   REFERRING DIAG:  I61.9 (ICD-10-CM) - Nontraumatic thalamic hemorrhage   G81.94 (ICD-10-CM) - Left hemiparesis    THERAPY DIAG:  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-dominant side (HCC)  Other abnormalities of gait and mobility  Muscle weakness (generalized)  Other lack of coordination  Chronic left shoulder pain  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 He would like to be as independent as possible.   Pt accompanied by: self  PERTINENT HISTORY: Update 10/31/2021 JM: Patient returns for follow-up after prior visit 7 months ago regarding history of  ICH, seizures and headaches.  He is unaccompanied.  Reports residual left sided weakness and nerve pain. Was previously just in foot, but now going up his whole leg to his hip.  At prior visit with PMR, recommended trialing different medications but he wishes to try to avoid medications if able.  He questions restarting therapies.  Continues to ambulate with a cane, denies any recent falls.  Does mention left side of face and tongue feels swollen over the past 2 weeks, denies any change in diet or medications.  Denies any difficulty breathing or swallowing. Compliant on Keppra, no seizure activity. Has not had any recent headaches, currently on topiramate 50 mg twice daily. On atorvastatin, denies side effects.  Blood pressure well controlled, today 112/65.  Is being seen by cardiology next week for further blood pressure management.  No further concerns at this time.  PRECAUTIONS: Fall  WEIGHT BEARING RESTRICTIONS: No  PAIN:  Are you having pain? Yes: NPRS scale: 7/10 Pain location: L side of body Pain description: tingling Aggravating factors: cold Relieving factors: heat  FALLS: Has patient fallen in last 6 months? Yes. Number of falls 1-he was leaving from the living room going to the kitchen, he lost his balance backwards.  LIVING ENVIRONMENT: Lives with: lives with their spouse-Wife's Albertina Parr Lives in: House/apartment Stairs: Yes: External: 4 steps; on left going up Has following equipment at home: Quad cane small base, Hemi walker, Wheelchair (manual), and Shower chair (no back)  PLOF: Requires assistive device for independence and Vocation/Vocational requirements: pt is on disability  PATIENT GOALS: improve use of LUE and overall independence with completion of functional activities.  OBJECTIVE:   HAND DOMINANCE: Right  ADLs: Overall ADLs: Eating: supervision Grooming: mod I with increased time UB Dressing: mod I; difficulty with buttons; occasional assistance if in a  hurry; difficulty tucking shirt into pants LB Dressing: mod I; difficulty with buttons; occasional assistance if in a hurry Toileting: mod I Bathing: mod I sponge bathing Tub Shower transfers: N/A; pt is having to shower at his home now, which is not accessible; he is sponge bathing Equipment: Shower seat without back and plans to get tub transfer bench, handheld shower head ; current shower is tub/shower with sliding doors  IADLs: Shopping: mod I uses cart; neighbor occasionally helps bring in groceries, otherwise he takes multiple trips Light housekeeping: mod I for sweeping, dusting; requires min A for laundry folding and dishes Meal Prep: Mod I with air fryer and [employer] mobility: driving; uses quad cane Medication management: setup Financial management: manages with wife at prior level Handwriting:  not affected as pt is L, non-dominant affected  MOBILITY STATUS: Independent; uses quad cane; increased time   ACTIVITY TOLERANCE: Activity tolerance: no reported change since CVA unless limited to pain  FUNCTIONAL OUTCOME MEASURES: Quick Dash: 45.45% disability with use of LUE  UPPER EXTREMITY ROM     AROM Right eval Left eval  Shoulder flexion WNL 120 with ataxia  Shoulder abduction WNL 120 with ataxia  Elbow flexion WNL WFL  Elbow extension WNL WFL  Wrist flexion WNL WFL  Wrist extension WNL N/A past neutral  Wrist pronation WNL WFL  Wrist supination WNL 45 degrees  (Blank rows = not tested)  L Digit flex and ext: [facility]; Digit opposition: BFL, very ataxic  UPPER EXTREMITY MMT:     MMT Right eval Left eval  Shoulder flexion WNL WFL within available ROM  Shoulder abduction WNL "  Elbow flexion WNL WFL  Elbow extension WNL WFL  (Blank rows = not tested)  HAND FUNCTION: Grip strength: Right: 123.8 lbs; Left: 14.7 lbs  COORDINATION: Ataxic; unable to test fine motor coordination secondary to extent of severity  SENSATION: Light touch: WFL  though reports decreased sensation in comparison to RUE  EDEMA: n/a  MUSCLE TONE: LUE: not formally tested this visit  COGNITION: Overall cognitive status: Within functional limits for tasks assessed  VISION: Subjective report: distance vision with L eye occasionally blurry; no recent diplopia Baseline vision:  OTC readers  VISION ASSESSMENT: To be further assessed in functional context   PERCEPTION: WFL  PRAXIS: WFL  OBSERVATIONS: Pt holds LUE in guarded positioning to L side of body. No volitional use with completion of functional activities.    TODAY'S TREATMENT:                                                                         DATE:  Reviewed LUE cane ROM HEP from previous episode.   PATIENT EDUCATION: Education details: HEP review Person educated: Patient Education method: Chief Technology Officer Education comprehension: verbalized understanding and needs further education  HOME EXERCISE PROGRAM: 11/16/2021 - reinitiated LUE cane HEP   GOALS: Goals reviewed with patient? Yes  SHORT TERM GOALS: Target date: 12/21/2021  Patient will demonstrate LUE ROM HEP with 25% verbal cues or less for proper execution. Baseline: Re-initiated LUE cane HEP Goal status: INITIAL  2.  Pt will demonstrate ability to pick up 9 hole peg in under 50 seconds Baseline: lacks opposition to index finger Goal status: INITIAL  3.  Will assess Box and Blocks during future visit and set goal as appropriate.  Baseline: Not tested due to time constraints Goal status: INITIAL    LONG TERM GOALS: Target date:  01/25/2022  1. Patient will demonstrate LUE coordination and strengthening HEP with 25% verbal cues or less for proper execution.  Baseline: n/a Goal status: INITIAL  2.  Patient will demonstrate at least 16%  improvement with quick Dash score (reporting 29.45% disability or less) indicating improved functional use of affected extremity.  Baseline: 45.45% disability with  use of LUE Goal status: INITIAL  3.  Pt will demonstrate at least 30 lbs L grip strength as needed to lift and carry items.  Baseline: 14.7 lbs Goal status: INITIAL  4.  Pt will perform home management and simple meal prep with good safety awareness and use of modifications/DME as needed.   Baseline:  Goal status: INITIAL    ASSESSMENT:  CLINICAL IMPRESSION: Patient is a 56 y.o. male who was seen today for occupational therapy evaluation for L hemiparesis following CVA. Hx includes cataract extraction (OU), anxiety, CHF, HTN, neuropathy of L foot, and seizures. Patient currently presents below baseline level  of functioning demonstrating functional deficits and impairments as noted below. Pt would benefit from skilled OT services in the outpatient setting to work on impairments as noted above to help pt return to PLOF as able.   PERFORMANCE DEFICITS: in functional skills including ADLs, IADLs, coordination, sensation, ROM, strength, pain, Fine motor control, Gross motor control, mobility, balance, and UE functional use.  IMPAIRMENTS: are limiting patient from ADLs, IADLs, and leisure.   CO-MORBIDITIES; may have co-morbidities  that affects occupational performance. Patient will benefit from skilled OT to address above impairments and improve overall function.  MODIFICATION OR ASSISTANCE TO COMPLETE EVALUATION: No modification of tasks or assist necessary to complete an evaluation.  OT OCCUPATIONAL PROFILE AND HISTORY: Problem focused assessment: Including review of records relating to presenting problem.  CLINICAL DECISION MAKING: LOW - limited treatment options, no task modification necessary  REHAB POTENTIAL: Fair secondary to insurance visit limits and limited caregiver assistance/home environment  EVALUATION COMPLEXITY: Low    PLAN:  OT FREQUENCY: 1-2x/week for up to 9 sessions  OT DURATION: 10 weeks   PLANNED INTERVENTIONS: self care/ADL training, therapeutic exercise,  therapeutic activity, neuromuscular re-education, manual therapy, functional mobility training, patient/family education, and DME and/or AE instructions  RECOMMENDED OTHER SERVICES: n/a  CONSULTED AND AGREED WITH PLAN OF CARE: Patient  PLAN FOR NEXT SESSION: Review LUE cane HEP; weightbearing; gross motor coordination   Dennis Bast, OT 11/16/2021, 3:34 PM

## 2021-11-16 ENCOUNTER — Ambulatory Visit: Payer: BC Managed Care – PPO | Admitting: Occupational Therapy

## 2021-11-16 ENCOUNTER — Encounter: Payer: Self-pay | Admitting: Occupational Therapy

## 2021-11-16 DIAGNOSIS — R2689 Other abnormalities of gait and mobility: Secondary | ICD-10-CM | POA: Diagnosis not present

## 2021-11-16 DIAGNOSIS — M25512 Pain in left shoulder: Secondary | ICD-10-CM | POA: Diagnosis not present

## 2021-11-16 DIAGNOSIS — I69154 Hemiplegia and hemiparesis following nontraumatic intracerebral hemorrhage affecting left non-dominant side: Secondary | ICD-10-CM

## 2021-11-16 DIAGNOSIS — M6281 Muscle weakness (generalized): Secondary | ICD-10-CM

## 2021-11-16 DIAGNOSIS — G8194 Hemiplegia, unspecified affecting left nondominant side: Secondary | ICD-10-CM | POA: Diagnosis not present

## 2021-11-16 DIAGNOSIS — R208 Other disturbances of skin sensation: Secondary | ICD-10-CM

## 2021-11-16 DIAGNOSIS — R52 Pain, unspecified: Secondary | ICD-10-CM

## 2021-11-16 DIAGNOSIS — R278 Other lack of coordination: Secondary | ICD-10-CM

## 2021-11-16 DIAGNOSIS — I69354 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-dominant side: Secondary | ICD-10-CM

## 2021-11-16 DIAGNOSIS — G8929 Other chronic pain: Secondary | ICD-10-CM | POA: Diagnosis not present

## 2021-11-16 DIAGNOSIS — I619 Nontraumatic intracerebral hemorrhage, unspecified: Secondary | ICD-10-CM | POA: Diagnosis not present

## 2021-11-16 DIAGNOSIS — R2681 Unsteadiness on feet: Secondary | ICD-10-CM | POA: Diagnosis not present

## 2021-11-16 NOTE — Therapy (Signed)
OUTPATIENT OCCUPATIONAL THERAPY NEURO EVALUATION  Patient Name: [name] MRN: [id number] DOB:1965-02-12, 56 y.o., male Today's Date: 11/17/2021  PCP: Rema Fendt, NP REFERRING PROVIDER: Ihor Austin, NP   OT End of Session - 11/17/21 1143     Visit Number 2    Number of Visits 9    Date for OT Re-Evaluation 01/25/21    Authorization Type BCBS    Authorization Time Period VL: PT/OT combined 30 (9 visits remaining with eval); pt has previously used 14 visits;    Authorization - Visit Number 2    Authorization - Number of Visits 9    OT Start Time 1147    OT Stop Time 1230    OT Time Calculation (min) 43 min    Activity Tolerance Patient tolerated treatment well    Behavior During Therapy WFL for tasks assessed/performed              Past Medical History:  Diagnosis Date   Anxiety    Cataract    removed left eye   Chronic combined systolic and diastolic CHF (congestive heart failure) (HCC)    EF 25-30%   Diastolic dysfunction    Dyslipidemia    Hyperlipidemia    Hypertension    Neuromuscular disorder (HCC)    neuropathy left foot   Nonischemic dilated cardiomyopathy (HCC)    felt secondary to HTN with no ischemia on nuclear stress test 05/2012, EF 25-30%   Seizures (HCC)    last seizure 05-2020- on Keppra   Shortness of breath    Stroke (HCC) 05/2020   Past Surgical History:  Procedure Laterality Date   COLONOSCOPY WITH PROPOFOL N/A 12/09/2020   Procedure: COLONOSCOPY WITH PROPOFOL;  Surgeon: Benancio Deeds, MD;  Location: [facility];  Service: Gastroenterology;  Laterality: N/A;   KNEE ARTHROSCOPY Left 04/13/2016   Procedure: ARTHROSCOPY KNEE WITH DEBRIDEMENT;  Surgeon: Valeria Batman, MD;  Location: [facility];  Service: Orthopedics;  Laterality: Left;   KNEE ARTHROSCOPY WITH MEDIAL MENISECTOMY Right 04/13/2016   Procedure: KNEE ARTHROSCOPY WITH MEDIAL MENISECTOMY;  Surgeon: Valeria Batman, MD;  Location: [facility];  Service: Orthopedics;  Laterality: LEFT not right knee   NASAL FRACTURE SURGERY     SHOULDER SURGERY Left    Patient Active Problem List   Diagnosis Date Noted   Chronic diastolic CHF (congestive heart failure) (HCC) 11/07/2021   Left ventricular hypertrophy 11/07/2021   Colon cancer screening    Left hemiplegia (HCC) 07/02/2020   Cognitive deficit due to recent stroke 07/02/2020   Dysphagia, post-stroke    Vascular headache    Acute blood loss anemia    AKI (acute kidney injury) (HCC)    Essential hypertension    Seizures (HCC)    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   ICH (intracerebral hemorrhage) (HCC) 06/09/2020   Sprain of anterior talofibular ligament of left ankle 01/15/2020   Hyperlipidemia, on Lipitor 04/01/2017   Alcohol use disorder, mild, abuse 06/30/2016   Morbid obesity (HCC) 06/30/2016   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   Chronic combined systolic and diastolic CHF (congestive heart failure) (HCC) 11/08/2012   HTN (hypertension) 05/25/2012    ONSET DATE: 06/08/20   REFERRING DIAG:  I61.9 (ICD-10-CM) - Nontraumatic thalamic hemorrhage   G81.94 (ICD-10-CM) - Left hemiparesis    THERAPY DIAG:  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-dominant side (HCC)  Other abnormalities of gait and mobility  Muscle weakness (generalized)  Hemiplegia and hemiparesis  following nontraumatic intracerebral hemorrhage affecting left non-dominant side (HCC)  Chronic left shoulder pain  Pain  Other disturbances of skin sensation  Rationale for Evaluation and Treatment Rehabilitation  SUBJECTIVE:   SUBJECTIVE STATEMENT: He has not attempted to complete his cane exercises at home since his evaluation ye\sterday.   Pt accompanied by: self  PERTINENT HISTORY: Update 10/31/2021 JM: Patient returns for follow-up after prior visit 7 months ago regarding history of ICH, seizures and headaches.  He is  unaccompanied.  Reports residual left sided weakness and nerve pain. Was previously just in foot, but now going up his whole leg to his hip.  At prior visit with PMR, recommended trialing different medications but he wishes to try to avoid medications if able.  He questions restarting therapies.  Continues to ambulate with a cane, denies any recent falls.  Does mention left side of face and tongue feels swollen over the past 2 weeks, denies any change in diet or medications.  Denies any difficulty breathing or swallowing. Compliant on Keppra, no seizure activity. Has not had any recent headaches, currently on topiramate 50 mg twice daily. On atorvastatin, denies side effects.  Blood pressure well controlled, today 112/65.  Is being seen by cardiology next week for further blood pressure management.  No further concerns at this time.  PRECAUTIONS: Fall  WEIGHT BEARING RESTRICTIONS: No  PAIN:  Are you having pain? Yes: NPRS scale: 6/10 Pain location: L shoulder Pain description: stiff Aggravating factors: cold Relieving factors: heat; stretching reporting improvement with reps  FALLS: Has patient fallen in last 6 months? Yes. Number of falls 1-he was leaving from the living room going to the kitchen, he lost his balance backwards.  PLOF: Requires assistive device for independence and Vocation/Vocational requirements: pt is on disability  PATIENT GOALS: improve use of LUE and overall independence with completion of functional activities.  OBJECTIVE:   HAND DOMINANCE: Right   MOBILITY STATUS: Independent; uses quad cane; increased time   ACTIVITY TOLERANCE: Activity tolerance: no reported change since CVA unless limited to pain  FUNCTIONAL OUTCOME MEASURES: Quick Dash: 45.45% disability with use of LUE  UPPER EXTREMITY ROM     AROM Right eval Left eval  Shoulder flexion WNL 120 with ataxia  Shoulder abduction WNL 120 with ataxia  Elbow flexion WNL WFL  Elbow extension WNL WFL   Wrist flexion WNL WFL  Wrist extension WNL N/A past neutral  Wrist pronation WNL WFL  Wrist supination WNL 45 degrees  (Blank rows = not tested)  L Digit flex and ext: [facility]; Digit opposition: BFL, very ataxic  UPPER EXTREMITY MMT:     MMT Right eval Left eval  Shoulder flexion WNL WFL within available ROM  Shoulder abduction WNL "  Elbow flexion WNL WFL  Elbow extension WNL WFL  (Blank rows = not tested)  HAND FUNCTION: Grip strength: Right: 123.8 lbs; Left: 14.7 lbs  COORDINATION: Ataxic; unable to test fine motor coordination secondary to extent of severity  SENSATION: Light touch: WFL though reports decreased sensation in comparison to RUE  EDEMA: n/a  MUSCLE TONE: LUE: not formally tested this visit  COGNITION: Overall cognitive status: Within functional limits for tasks assessed  VISION: Subjective report: distance vision with L eye occasionally blurry; no recent diplopia Baseline vision:  OTC readers  VISION ASSESSMENT: To be further assessed in functional context   PERCEPTION: WFL  PRAXIS: WFL  OBSERVATIONS: Pt holds LUE in guarded positioning to L side of body. Ataxic movements in LLE with  going from EOB to supine. Improved with use of log rolling technique. Ataxic movements in LUE improved with cues to slow movements.  TODAY'S TREATMENT:  - Neuro re-education completed for duration as noted below including: Reviewed LUE cane ROM HEP from previous episode. Therapist educated pt to lower LUE below level of heart/over edge of mat table if paresthesias increased between reps as needed to improve circulation and reduce reported paresthesias.   Pt transitioned to sidelying on affected side requiring cueing for proper positioning to reduce ataxic movements and comfort to increase weightbearing through the affected extremity.   While remaining in sidelying, pt completed functional reaching with L hand in fist to increase safety with ataxic movements and  improve overall ROM, coordination, and pain  Pt transitioned back to EOB using log rolling technique weightbearing through LUE to encourage incorporation into completion of functional activities will increasing proprioceptive input as needed for improved coordination  While sitting EOB, pt completed reaching with opposite hand while leaning through opposite side for improved coordination, strength, and providing desired weight-bearing.   PATIENT EDUCATION: Education details: HEP review Person educated: Patient Education method: Theatre stage manager Education comprehension: verbalized understanding and needs further education  HOME EXERCISE PROGRAM: 11/16/2021 - reinitiated LUE cane HEP  GOALS: Goals reviewed with patient? Yes  SHORT TERM GOALS: Target date: 12/21/2021  Patient will demonstrate LUE ROM HEP with 25% verbal cues or less for proper execution. Baseline: Re-initiated LUE cane HEP Goal status: INITIAL  2.  Pt will demonstrate ability to pick up 9 hole peg in under 50 seconds Baseline: lacks opposition to index finger Goal status: INITIAL  3.  Will assess Box and Blocks during future visit and set goal as appropriate.  Baseline: Not tested due to time constraints Goal status: INITIAL    LONG TERM GOALS: Target date:  01/25/2022  1. Patient will demonstrate LUE coordination and strengthening HEP with 25% verbal cues or less for proper execution.  Baseline: n/a Goal status: INITIAL  2.  Patient will demonstrate at least 16%  improvement with quick Dash score (reporting 29.45% disability or less) indicating improved functional use of affected extremity.  Baseline: 45.45% disability with use of LUE Goal status: INITIAL  3.  Pt will demonstrate at least 30 lbs L grip strength as needed to lift and carry items.  Baseline: 14.7 lbs Goal status: INITIAL  4.  Pt will perform home management and simple meal prep with good safety awareness and use of modifications/DME  as needed.   Baseline:  Goal status: INITIAL    ASSESSMENT:  CLINICAL IMPRESSION: Pt benefits from skilled OT services following CVA for improved LUE coordination, pain, and safety with completion of functional activities. Pt's progression towards goals limited by carry-over outside of clinic; however, pt seems now to better understand importance of HEP completion.  PERFORMANCE DEFICITS: in functional skills including ADLs, IADLs, coordination, sensation, ROM, strength, pain, Fine motor control, Gross motor control, mobility, balance, and UE functional use.  IMPAIRMENTS: are limiting patient from ADLs, IADLs, and leisure.   PLAN:  OT FREQUENCY: 1-2x/week for up to 9 sessions  OT DURATION: 10 weeks   PLANNED INTERVENTIONS: self care/ADL training, therapeutic exercise, therapeutic activity, neuromuscular re-education, manual therapy, functional mobility training, patient/family education, and DME and/or AE instructions  RECOMMENDED OTHER SERVICES: n/a  CONSULTED AND AGREED WITH PLAN OF CARE: Patient  PLAN FOR NEXT SESSION: Weightbearing; gross motor coordination; UBE; Assess Box and Blocks   Dennis Bast, OT 11/17/2021, 12:34 PM

## 2021-11-17 ENCOUNTER — Ambulatory Visit: Payer: BC Managed Care – PPO | Admitting: Physical Therapy

## 2021-11-17 ENCOUNTER — Encounter
Payer: BC Managed Care – PPO | Attending: Physical Medicine and Rehabilitation | Admitting: Physical Medicine and Rehabilitation

## 2021-11-17 ENCOUNTER — Encounter: Payer: Self-pay | Admitting: Physical Therapy

## 2021-11-17 ENCOUNTER — Encounter: Payer: Self-pay | Admitting: Occupational Therapy

## 2021-11-17 ENCOUNTER — Ambulatory Visit: Payer: BC Managed Care – PPO | Admitting: Occupational Therapy

## 2021-11-17 DIAGNOSIS — M6281 Muscle weakness (generalized): Secondary | ICD-10-CM

## 2021-11-17 DIAGNOSIS — I619 Nontraumatic intracerebral hemorrhage, unspecified: Secondary | ICD-10-CM | POA: Diagnosis not present

## 2021-11-17 DIAGNOSIS — M25512 Pain in left shoulder: Secondary | ICD-10-CM | POA: Diagnosis not present

## 2021-11-17 DIAGNOSIS — R52 Pain, unspecified: Secondary | ICD-10-CM

## 2021-11-17 DIAGNOSIS — G8194 Hemiplegia, unspecified affecting left nondominant side: Secondary | ICD-10-CM | POA: Diagnosis not present

## 2021-11-17 DIAGNOSIS — G8929 Other chronic pain: Secondary | ICD-10-CM | POA: Diagnosis not present

## 2021-11-17 DIAGNOSIS — I69154 Hemiplegia and hemiparesis following nontraumatic intracerebral hemorrhage affecting left non-dominant side: Secondary | ICD-10-CM | POA: Diagnosis not present

## 2021-11-17 DIAGNOSIS — R278 Other lack of coordination: Secondary | ICD-10-CM | POA: Diagnosis not present

## 2021-11-17 DIAGNOSIS — R2689 Other abnormalities of gait and mobility: Secondary | ICD-10-CM

## 2021-11-17 DIAGNOSIS — R208 Other disturbances of skin sensation: Secondary | ICD-10-CM

## 2021-11-17 DIAGNOSIS — I69354 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-dominant side: Secondary | ICD-10-CM

## 2021-11-17 DIAGNOSIS — Z630 Problems in relationship with spouse or partner: Secondary | ICD-10-CM

## 2021-11-17 DIAGNOSIS — R2681 Unsteadiness on feet: Secondary | ICD-10-CM | POA: Diagnosis not present

## 2021-11-17 NOTE — Progress Notes (Signed)
Subjective:    Patient ID: [name], male    DOB: February 15, 1965, 56 y.o.   MRN: [id number]  HPI An audio/video tele-health visit is felt to be the most appropriate encounter for this patient at this time. This is a follow up tele-visit via phone. The patient is at home. MD is at office. Prior to scheduling this appointment, our staff discussed the limitations of evaluation and management by telemedicine and the availability of in-person appointments. The patient expressed understanding and agreed to proceed.   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   1) CVA -Incredible improvement in ambulation!  -graduated from therapy -Neuropathy is moving up to neck.  -was released from the hospital one year today -she is doing exercises.  -left hand is getting on his nerves -he thinks -his therapists are planning to take him to the pool and told him this may help with his arm pain -pain has been severe at home, as it had been in hospital -his wife purchased 500mg  tablets of tylenol -he is worried about his decreased upper extremity strength and asks if this will ever improve -he is not experiencing much shoulder pain right now -he does have some strength -the benefits from the trigger point injections lwore off within 1 week -pain is severely limiting his function -he is taking Tizanidine and it does not help, does make him sleepy, but his wife is trying to encourage him to stay up during the day -he developed the foot pain a couple of weeks ago -he was wearing a boot at that time so took it off but has still had pain a couple of weeks afterward. -he has not tried anything for this pain -it is worst at night and feels like burning and tingling at his heel. -he has been doing great with therapy and is walking in his home! -his wife asks if  he could get a handicap placard.  -the foot is still feeling numb with pins and needles, also painful. He is unsure whether Gabapentin helps- so far nothing he as tried has helped his neuropathy. His balance is also now off and he has been stumbling more. He actually felt that the Gabapentin made his pain worse. He has never tried Cymbalta but is willing to try it.  -Has been able to use bathroom himself! -he is working with therapy right now.  -he was feeling wobbly over the weekend so had to miss his PT session -shoulder is feeling very numb and cannot make a fist -ready to proceed with Qutenza today -His wife asks for refill of Eucerin and Diclofenac and Tizanidine.  -She asks whether he should still be taking Seroquel -he called medical records and records were faxed on December 28th.  -He called our office today  -He needs medical records to process his disability claims -able to walk much better.  -his wife mentioned to me that she is still worried about him driving. He has been driving around locally to pharmacy   2) Lower extremity edema -worsening -not painful -he asks when his next appointment with me is.  -has gotten used to it -therapist was concerned -painful -sometimes feels cold on the left foot -never took the fluid pills -he is hoping to walk more now that the weather has improved  3) Headaches -started last week and felt loss of  balance -this stopped him from doing therapy.  -still having tension in head.  -he takes topamax 25mg  HS.   4) Obesity -he has lost 36 lbs! -214 lbs   5) Left shoulder pain -intermittent -sometimes he uses voltaren gel and this seems to help the most  6) Left upper extremity neuropathy -started last month -received steroid injection 2 months ago. Asks if this is a side effect of the steroid injection -asks about a supplement and if this is ok to take -he asks if there is anything else he can do for this  7) Cervical myofascial  pain syndrome -he wants to know if injections could help  8) Neuropathy is getting worse -he gets depressed about this.   9) Urinary frequency: -notes that this started after taking the Baclofen, but his wife said she never gave it to him -he did take the cymbalta but stopped due to this potential side effect -he is urinating every 5-10 minutes -he is willing to get UA and UC drawn today.   10) Problems with wife -he believes his wife is trying to tell his healthcare providers he has dementia -he also feels that one day his cane was filled with water to make it more difficult for him to use and that she did this, he says this was witnessed by his therapy team -his family is unaware of this as they have moved and he has not yet discussed with them -he would like his medical records to be restricted from his wife.     Family History  Problem Relation Age of Onset   Hypertension Mother        Passed away when she is 29 secondary to hip surgery complications   CAD Mother    Heart attack Mother    Hypertension Father    Diabetes Brother    Stroke Brother    Heart attack Maternal Grandmother    Colon cancer Neg Hx    Esophageal cancer Neg Hx    Pancreatic cancer Neg Hx    Stomach cancer Neg Hx    Colon polyps Neg Hx    Social History   Socioeconomic History   Marital status: Married    Spouse name: Not on file   Number of children: Not on file   Years of education: Not on file   Highest education level: Not on file  Occupational History   Not on file  Tobacco Use   Smoking status: Former    Types: Cigarettes    Passive exposure: Past   Smokeless tobacco: Never  Vaping Use   Vaping Use: Never used  Substance and Sexual Activity   Alcohol use: Not Currently   Drug use: Not Currently    Types: Cocaine, Marijuana, Oxycodone   Sexual activity: Yes  Other Topics Concern   Not on file  Social History Narrative   Not on file   Social Determinants of Health    Financial Resource Strain: Not on file  Food Insecurity: Not on file  Transportation Needs: Not on file  Physical Activity: Not on file  Stress: Not on file  Social Connections: Not on file   Past Surgical History:  Procedure Laterality Date   COLONOSCOPY WITH PROPOFOL N/A 12/09/2020   Procedure: COLONOSCOPY WITH PROPOFOL;  Surgeon: Benancio Deeds, MD;  Location: [facility];  Service: Gastroenterology;  Laterality: N/A;   KNEE ARTHROSCOPY Left 04/13/2016   Procedure: ARTHROSCOPY KNEE WITH DEBRIDEMENT;  Surgeon: Valeria Batman, MD;  Location: [facility];  Service: Orthopedics;  Laterality: Left;   KNEE ARTHROSCOPY WITH MEDIAL MENISECTOMY Right 04/13/2016   Procedure: KNEE ARTHROSCOPY WITH MEDIAL MENISECTOMY;  Surgeon: Valeria Batman, MD;  Location: [facility];  Service: Orthopedics;  Laterality: LEFT not right knee   NASAL FRACTURE SURGERY     SHOULDER SURGERY Left    Past Medical History:  Diagnosis Date   Anxiety    Cataract    removed left eye   Chronic combined systolic and diastolic CHF (congestive heart failure) (HCC)    EF 25-30%   Diastolic dysfunction    Dyslipidemia    Hyperlipidemia    Hypertension    Neuromuscular disorder (HCC)    neuropathy left foot   Nonischemic dilated cardiomyopathy (HCC)    felt secondary to HTN with no ischemia on nuclear stress test 05/2012, EF 25-30%   Seizures (HCC)    last seizure 05-2020- on Keppra   Shortness of breath    Stroke (HCC) 05/2020   There were no vitals taken for this visit.  Opioid Risk Score:   Fall Risk Score:  `1  Depression screen [facility] 2/9     09/14/2021    2:14 PM 07/21/2021    9:38 AM 03/21/2021   12:36 PM 09/22/2020   11:01 AM 09/14/2020    4:02 PM 09/14/2020    2:29 PM 07/30/2020   11:18 AM  Depression screen PHQ 2/9  Decreased Interest 0 0 0 0 0 0 0  Down, Depressed, Hopeless 0 0 0 0 0 0 0  PHQ - 2 Score 0 0 0 0 0 0 0  Altered sleeping     0  0  Tired,  decreased energy     0  0  Change in appetite     0  0  Feeling bad or failure about yourself      0  0  Trouble concentrating     0  0  Moving slowly or fidgety/restless     0  0  Suicidal thoughts     0  0  PHQ-9 Score     0  0  Difficult doing work/chores    Not difficult at all   Not difficult at all    Review of Systems     Objective:   Physical Exam Not performed as patient was seen via phone.    Assessment & Plan:  [name] is a 56 year old man presenting today for follow-up of left hemiplegia, right sided low back pain, and left sided foot pain following stroke, left shoulder pain.  1) Right sided low back pain: -discussed with patient and wife that Xrs show spinal degeneration -pain appears to be more myofascial -trigger point injections with lidocaine provided <1 week of benefit -Would like to try Botox injections for longer relief as this pain is greatly inhibiting patient's function and quality of life -continue heat and lidocaine patch and home physical and occupational therapy -Can take tylenol 500mg  three times per day -minimize use of Norco as much as possible as not benefiting and is a high risk opioid -can continue tizandine as needed.   2) left sided foot neuropathic pain -stop Gabapentin due to somnolence/lack of efficacy.  -encouraged his exercise -he is purchasing a stimulator that he plans to use on his arm and foot.  -start Cymbalta 20mg  daily, discussed side effects of nausea.  -recommended icing 15 minutes three times per day -hold off on therapy until neuropathy  improves -can keep compression garment and boot off since these were worsening his pain -Will not repeat Qutenza since it did not did help foot pain -Turmeric to reduce inflammation--can be used in cooking or taken as a supplement.  Benefits of turmeric:  -Highly anti-inflammatory  -Increases antioxidants  -Improves memory, attention, brain disease  -Lowers risk of heart  disease  -May help prevent cancer  -Decreases pain  -Alleviates depression  -Delays aging and decreases risk of chronic disease  -Consume with black pepper to increase absorption    3) Right thalamic hemorrhage -provided with a handicap placard.  --progress to quad walker -face to face for AFO today -discussed that most motor improvement occurs in the first 4 weeks, but improvement is still seen in the first 6 months, and some improvement may still be seen in the first 2 weeks -evaluated for vagal nerve stimulation trial for unfortunately will not qualify due to his stroke being hemorrhagic -will send him information about deep brain stimulation, discussed with him that I have seen this help one of my patients  4) Left sided arm pain secondary to spasticity -plan for Botox 100U next visit -has felt numbness and weakness in his upper extremity.  -refilled tizanidine, lidocaine patch, and diclofenac gel -can consider repeating Qutenza as it did help with his shoulder pain.  -continue tylenol before sleep -discussed only doing steroid injection if he is having pain, as pain is currently controlled with muscle rub -discussed MRI results:  1. Mild-moderate rotator cuff tendinosis without tear. 2. Severe intra-articular biceps tendinosis with mild tenosynovitis. 3. 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  -continue therapy -100U Botox next visit to LUE -continue voltaren gel  -discussed risks and benefits of steroid injection  5) Agitation: resolved, may discontinue seroquel.  6) Bilateral lower extremity edema -recommended avoiding added salt -prescribed lasix  daily prn  7) HTN 133/75 -commended on improvements in blood pressure! -Advised checking BP daily at home and logging results to bring into follow-up appointment with PCP and myself. -Start Baclofen  HS -Reviewed BP meds today.  -Advised regarding healthy foods that can help lower  blood pressure and provided with a list: 1) citrus foods- high in vitamins and minerals 2) salmon and other fatty fish - reduces inflammation and oxylipins 3) swiss chard (leafy green)- high level of nitrates 4) pumpkin seeds- one of the best natural sources of magnesium 5) Beans and lentils- high in fiber, magnesium, and potassium 6) Berries- high in flavonoids 7) Amaranth (whole grain, can be cooked similarly to rice and oats)- high in magnesium and fiber 8) Pistachios- even more effective at reducing BP than other nuts 9) Carrots- high in phenolic compounds that relax blood vessels and reduce inflammation 10) Celery- contain phthalides that relax tissues of arterial walls 11) Tomatoes- can also improve cholesterol and reduce risk of heart disease 12) Broccoli- good source of magnesium, calcium, and potassium 13) Greek yogurt: high in potassium and calcium 14) Herbs and spices: Celery seed, cilantro, saffron, lemongrass, black cumin, ginseng, cinnamon, cardamom, sweet basil, and ginger 15) Chia and flax seeds- also help to lower cholesterol and blood sugar 16) Beets- high levels of nitrates that relax blood vessels  17) spinach and bananas- high in potassium  -Provided lise of supplements that can help with hypertension:  1) magnesium: one high quality brand is Bioptemizers since it contains all 7 types of magnesium, otherwise over the counter magnesium gluconate  is a good option 2) B vitamins 3) vitamin D  4) potassium 5) CoQ10 6) L-arginine 7) Vitamin C 8) Beetroot -Educated that goal BP is 120/80. -Made goal to incorporate some of the above foods into diet.    8) Obesity -commended on 36 pound weight loss! Discussed how this can help with his pain as well -current BMI is 31.01 -weight 210 lbs today -Discussed the benefits of intermittent fasting. Recommended starting with pushing dinner 15 minutes earlier and when this feels easy, continuing to push dinner 15 minutes  earlier. Discussed that this can help her body to improve its ability to burn fat rather than glucose, improving insulin sensitivity. Recommended drinking Roobois tea in the evening to help curb appetite and for its numerous health benefits.    9) Left shoulder pain -XR ordered and results discussed with patient: shows subluxation and glenohumeral arthritis. Discussed applying volataren gel or CBD oil, using heat pad, continuing therapy and avoiding movements that worsen the pain  -plan for MRI of shoulder.   -Discussed Sprint PNS system as an option of pain treatment via neuromodulation. Provided following link for patient to learn more about the system: https://www.sprtherapeutics.com/. Explained mechanism of activating A beta fibers which function in touch, pressure, and vibration, to inhibit the sensations of A delta and C fibers which are responsible for pain transmission.    -will change next appointment to a steroid injection, discussed risks and benefits of steroid injection an will only perform if pain has failed to improve with above interventions.  -Discussed current symptoms of pain and history of pain.  -Discussed benefits of exercise in reducing pain. -Discussed following foods that may reduce pain: 1) Ginger (especially studied for arthritis)- reduce leukotriene production to decrease inflammation 2) Blueberries- high in phytonutrients that decrease inflammation 3) Salmon- marine omega-3s reduce joint swelling and pain 4) Pumpkin seeds- reduce inflammation 5) dark chocolate- reduces inflammation 6) turmeric- reduces inflammation 7) tart cherries - reduce pain and stiffness 8) extra virgin olive oil - its compound olecanthal helps to block prostaglandins  9) chili peppers- can be eaten or applied topically via capsaicin 10) mint- helpful for headache, muscle aches, joint pain, and itching 11) garlic- reduces inflammation  Link to further information on diet for chronic pain:  http://www.randall.com/   Turmeric to reduce inflammation--can be used in cooking or taken as a supplement.  Benefits of turmeric:  -Highly anti-inflammatory  -Increases antioxidants  -Improves memory, attention, brain disease  -Lowers risk of heart disease  -May help prevent cancer  -Decreases pain  -Alleviates depression  -Delays aging and decreases risk of chronic disease  -Consume with black pepper to increase absorption    Turmeric Milk Recipe:  1 cup milk  1 tsp turmeric  1 tsp cinnamon  1 tsp grated ginger (optional)  Black pepper (boosts the anti-inflammatory properties of turmeric).  1 tsp honey    10) Left upper extremity neuropathy -discussed that it is not likely associated with steroid injection since started 1 month afterward -discussed that since he does have neck pain, it may be beneficial to get an XR of his neck to assess for stenosis -reviewed the ingredients in the supplement he is interested in and discussed that these have shown benefit for neuropathic pain  11) Thoracic spine pain -XR of thoracic spine ordered.   12) Insomnia:  Can consider baclofen 10mg  HS  13) Urinary frequency -UA/UC ordered -discussed that I will call tomorrow with results.   37) Problems with wife -discussed his current suspicions of his wife, empathized -discussed that we would  not communicate with his wife moving forward, only directly with him  6 minutes spent in listening to patient's suspicions regarding his wife's accusations that he has dementia, discussing that we would not communicate with wife moving forward

## 2021-11-17 NOTE — Progress Notes (Signed)
   11/17/21 2105  PT Visits / Re-Eval  Visit Number 2  Number of Visits 9  Date for PT Re-Evaluation 01/13/22  Authorization  Authorization Type [employer] 30 visits PT/OT combined - If seen for PT & OT on the same it constitutes one visit, notes say 8 used, pt states 6 remaining, when counted from start of calendar year 49 left.  PT Time Calculation  PT Start Time 1112  PT Stop Time 1145  PT Time Calculation (min) 33 min  PT - End of Session  Equipment Utilized During Treatment Gait belt  Activity Tolerance Patient tolerated treatment well  Behavior During Therapy [facility] for tasks assessed/performed

## 2021-11-17 NOTE — Therapy (Signed)
OUTPATIENT PHYSICAL THERAPY NEURO EVALUATION   Patient Name: [name] MRN: [id number] DOB:1965-02-17, 56 y.o., male Today's Date: 11/17/2021   PCP: Camillia Herter, NP REFERRING PROVIDER: Frann Rider, NP     11/17/21 2105  PT Visits / Re-Eval  Visit Number 2  Number of Visits 9  Date for PT Re-Evaluation 01/13/22  Authorization  Authorization Type [employer] 30 visits PT/OT combined - If seen for PT & OT on the same it constitutes one visit, notes say 8 used, pt states 6 remaining, when counted from start of calendar year 51 left.  PT Time Calculation  PT Start Time 1112  PT Stop Time 1145  PT Time Calculation (min) 33 min  PT - End of Session  Equipment Utilized During Treatment Gait belt  Activity Tolerance Patient tolerated treatment well  Behavior During Therapy WFL for tasks assessed/performed     Past Medical History:  Diagnosis Date   Anxiety    Cataract    removed left eye   Chronic combined systolic and diastolic CHF (congestive heart failure) (HCC)    EF 37-16%   Diastolic dysfunction    Dyslipidemia    Hyperlipidemia    Hypertension    Neuromuscular disorder (HCC)    neuropathy left foot   Nonischemic dilated cardiomyopathy ([place])    felt secondary to HTN with no ischemia on nuclear stress test 05/2012, EF 25-30%   Seizures ([place])    last seizure 05-2020- on Keppra   Shortness of breath    Stroke ([place]) 05/2020   Past Surgical History:  Procedure Laterality Date   COLONOSCOPY WITH PROPOFOL N/A 12/09/2020   Procedure: COLONOSCOPY WITH PROPOFOL;  Surgeon: Yetta Flock, MD;  Location: [facility];  Service: Gastroenterology;  Laterality: N/A;   KNEE ARTHROSCOPY Left 04/13/2016   Procedure: ARTHROSCOPY KNEE WITH DEBRIDEMENT;  Surgeon: Garald Balding, MD;  Location: [place];  Service: Orthopedics;  Laterality: Left;   KNEE ARTHROSCOPY WITH MEDIAL MENISECTOMY Right 04/13/2016   Procedure: KNEE ARTHROSCOPY WITH MEDIAL  MENISECTOMY;  Surgeon: Garald Balding, MD;  Location: [place];  Service: Orthopedics;  Laterality: LEFT not right knee   NASAL FRACTURE SURGERY     SHOULDER SURGERY Left    Patient Active Problem List   Diagnosis Date Noted   Chronic diastolic CHF (congestive heart failure) ([place]) 11/07/2021   Left ventricular hypertrophy 11/07/2021   Colon cancer screening    Left hemiplegia ([place]) 07/02/2020   Cognitive deficit due to recent stroke 07/02/2020   Dysphagia, post-stroke    Vascular headache    Acute blood loss anemia    AKI (acute kidney injury) ([place])    Essential hypertension    Seizures (HCC)    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   ICH (intracerebral hemorrhage) ([place]) 06/09/2020   Sprain of anterior talofibular ligament of left ankle 01/15/2020   Hyperlipidemia, on Lipitor 04/01/2017   Alcohol use disorder, mild, abuse 06/30/2016   Morbid obesity ([place]) 06/30/2016   Nonischemic dilated cardiomyopathy ([place]) 11/08/2012   Chronic combined systolic and diastolic CHF (congestive heart failure) ([place]) 11/08/2012   HTN (hypertension) 05/25/2012    ONSET DATE: 10/31/2021   REFERRING DIAG: I61.9 (ICD-10-CM) - Nontraumatic thalamic hemorrhage (HCC) G81.94 (ICD-10-CM) - Left hemiparesis ([place])   THERAPY DIAG:  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-dominant side (HCC)  Other abnormalities of gait and mobility  Muscle weakness (generalized)  Rationale for Evaluation and Treatment Rehabilitation  SUBJECTIVE:  SUBJECTIVE STATEMENT: Pt reports the washer on his cane keeps coming loose and sliding down, resulting in the cane making a clicking noise when he walks with it; asks if I will take a look at it and see what the problem is; pt reports no  changes since eval Pt accompanied by: self  PERTINENT HISTORY: HTN, Non ischemic dilated cardiomyopathy, chronic combined CHF, ETOH, hyperlipidemia, ICH, seizures, CVA 06/09/2020  PAIN:  Are you having pain? No-wouldn't consider neuropathy painful  PRECAUTIONS: Fall  WEIGHT BEARING RESTRICTIONS No  FALLS: Has patient fallen in last 6 months? Yes. Number of falls 1-he was leaving from the living room going to the kitchen, he lost his balance backwards.  LIVING ENVIRONMENT: Lives with: lives with their spouse-Wife's Kimberlee Nearing Lives in: House/apartment Stairs: Yes: External: 4 steps; on left going up Has following equipment at home: Quad cane small base, Hemi walker, Wheelchair (manual), and Shower bench  PLOF: Requires assistive device for independence and Vocation/Vocational requirements: pt is on disability  PATIENT GOALS "To be able to walk without the cane."  OBJECTIVE:   Examined pt's cane per his request - washer on cane was tight as pt stated he had tightened it earlier, but reported it continued to fall down;  washer on cane was loosened for examination and a watery, blackish liquid was on cane in area of washer which screws to tighten cane  - liquid was rubbed off onto PT's hand during this inspection.  Cane was washed to remove this liquid and then dried well to remove all moisture and liquid - washer was secured tightly back in proper place. Pt used this SBQC after this cleaning and no noise was made with use of cane.  TherEx: Lt hamstring stretch - runner's stretch 30 sec hold x1 rep at counter Heel cord stretch LLE  using bottom shelf 30 sec x 1 rep   Sit to stand 10 reps with Rt foot on balance bubble to facilitate increased weight bearing on LLE for strengthening  Step ups to 6" step with LLE 10 reps  GAIT: Gait pattern: decreased hip/knee flexion- Left, decreased ankle dorsiflexion- Left, decreased trunk rotation, wide BOS, abducted- Left, and poor foot  clearance- Left Distance walked: 230' Assistive device utilized: [facility] with rubber quad base Level of assistance: SBA and CGA Comments: Pt demonstrates some ataxia with initiation of steps.   PATIENT EDUCATION: Education details:  gave pt sheet with hamstring and heel cord stretches -  Person educated: Patient Education method: Explanation, demonstration, handout Education comprehension: verbalized understanding and needs further education   HOME EXERCISE PROGRAM: Pt requesting review of prior:  [id number]    GOALS: Goals reviewed with patient? Yes  SHORT TERM GOALS: Target date: 12/16/2021  Pt will be independent with strength and balance HEP to promote maintenance of gains outside the clinic.  Baseline:  To be reviewed and modified. Goal status: INITIAL  2.  Pt will demonstrate TUG of </=19 seconds in order to decrease risk of falls and improve functional mobility using LRAD. Baseline: 23.44 sec w/ SBQC (10/18) Goal status: INITIAL  3.  Pt will demonstrate a gait speed of >/=1.84 feet/sec using LRAD in order to decrease risk for falls. Baseline: 1.54 ft/sec SBQC Goal status: INITIAL  4.  BERG to be assessed w/ LTG set as appropriate. Baseline: To be assessed. Goal status: INITIAL  5.  Pt will ambulate >/=345' w/ SBQC at no more than SBA level over level, indoor surfaces to improve safety with household and  limited community ambulation. Baseline: various clinic distances SBA/CGA w/ SBQC Goal status: INITIAL  LONG TERM GOALS: Target date: 01/13/2022  Pt to be independent with advance strength and balance HEP. Baseline: To be modified. Goal status: INITIAL  2.  Pt will demonstrate TUG of </=15 seconds in order to decrease risk of falls and improve functional mobility using LRAD. Baseline: 23.44 sec w/ SBQC Goal status: INITIAL  3.  Pt will demonstrate a gait speed of >/=2.14 feet/sec using LRAD in order to decrease risk for falls. Baseline: 1.54 ft/sec SBQC Goal  status: INITIAL  4.  BERG to be assessed w/ goal set as appropriate. Baseline: To be assessed. Goal status: INITIAL  5.   Pt will ambulate >/= 460' w/ LRAD ModI over level, indoor surfaces to improve safety with household and limited community ambulation. Baseline: various clinic distances SBA/CGA w/ SBQC Goal status: INITIAL  ASSESSMENT:  CLINICAL IMPRESSION: Pt arrived 10" late for PT session so exercises were limited due to time constraint.  Session focused on LLE strengthening in closed chain position using step up exercise and sit to stand with RLE on compliant surface for increased weight shift onto LLE.  Pt able to safely amb. With [facility] with rubber quad tip; cues to increase Lt knee flexion as able in swing phase of gait without performing steppage gait pattern.  Cont with POC.   OBJECTIVE IMPAIRMENTS Abnormal gait, decreased activity tolerance, decreased balance, decreased coordination, decreased endurance, decreased mobility, difficulty walking, decreased strength, decreased safety awareness, increased edema, impaired sensation, impaired tone, improper body mechanics, and postural dysfunction.   ACTIVITY LIMITATIONS carrying, lifting, stairs, transfers, and locomotion level  PARTICIPATION LIMITATIONS: community activity and occupation  PERSONAL FACTORS Age, Fitness, Past/current experiences, Time since onset of injury/illness/exacerbation, and 1-2 comorbidities: HTN, chronic CHF/cardiomyopathy  are also affecting patient's functional outcome.   REHAB POTENTIAL: Fair See personal factors  CLINICAL DECISION MAKING: Stable/uncomplicated  EVALUATION COMPLEXITY: Low  PLAN: PT FREQUENCY: 1x/week  PT DURATION: 8 weeks  PLANNED INTERVENTIONS: Therapeutic exercises, Therapeutic activity, Neuromuscular re-education, Balance training, Gait training, Patient/Family education, Self Care, Joint mobilization, Stair training, Vestibular training, Orthotic/Fit training, DME instructions,  Electrical stimulation, Manual therapy, and Re-evaluation  PLAN FOR NEXT SESSION: check HEP - stretches (1 sheet); cont gait with rubber quad base; gait with no device   Montague Corella, [place], PT 11/17/2021, 9:02 PM

## 2021-11-18 ENCOUNTER — Other Ambulatory Visit: Payer: Self-pay

## 2021-11-18 ENCOUNTER — Telehealth: Payer: Self-pay

## 2021-11-18 DIAGNOSIS — Z125 Encounter for screening for malignant neoplasm of prostate: Secondary | ICD-10-CM | POA: Diagnosis not present

## 2021-11-18 NOTE — Telephone Encounter (Signed)
Copied from [place] [id number]. Topic: General - Other >> Nov 17, 2021  4:45 PM Ja-Kwan M wrote: Reason for CRM: Pt wife [name] stated pt needs to have labs for PSA and glucose asap because the deadline is set for 11/18/21. Cheryl requests call back asap to advise when the order is placed so pt can be scheduled for the labs. Cb# [phone]   Spoke to pt wife [name] and advised her that I would need to see the form that is stating pt needs these particular labs completed. [name] states she will bring form by office today and possibly have pt with her so he can complete the labs her employers health certification is requesting. Also Elisha Headland that provider is out and form will not be signed until Tuesday if approved by provider.  Pt wife was advised that pt has not completed his annual physical exam for 2023 and needs to call office to schedule an appt.

## 2021-11-18 NOTE — Progress Notes (Signed)
PSA collected per pt request to have a health certification form completed for employer

## 2021-11-19 ENCOUNTER — Other Ambulatory Visit: Payer: Self-pay | Admitting: Family

## 2021-11-19 DIAGNOSIS — R3911 Hesitancy of micturition: Secondary | ICD-10-CM

## 2021-11-19 DIAGNOSIS — R972 Elevated prostate specific antigen [PSA]: Secondary | ICD-10-CM

## 2021-11-19 LAB — PSA: Prostate Specific Ag, Serum: 4.2 ng/mL — ABNORMAL HIGH (ref 0.0–4.0)

## 2021-11-19 LAB — SPECIMEN STATUS REPORT

## 2021-11-22 ENCOUNTER — Ambulatory Visit (INDEPENDENT_AMBULATORY_CARE_PROVIDER_SITE_OTHER): Payer: BC Managed Care – PPO | Admitting: Family Medicine

## 2021-11-22 ENCOUNTER — Encounter: Payer: Self-pay | Admitting: Family Medicine

## 2021-11-22 ENCOUNTER — Encounter: Payer: BC Managed Care – PPO | Admitting: Family Medicine

## 2021-11-22 VITALS — BP 128/79 | HR 78 | Temp 98.1°F | Resp 16 | Ht 70.0 in | Wt 191.8 lb

## 2021-11-22 DIAGNOSIS — Z130 Encounter for screening for diseases of the blood and blood-forming organs and certain disorders involving the immune mechanism: Secondary | ICD-10-CM | POA: Diagnosis not present

## 2021-11-22 DIAGNOSIS — Z0189 Encounter for other specified special examinations: Secondary | ICD-10-CM

## 2021-11-22 DIAGNOSIS — Z13220 Encounter for screening for lipoid disorders: Secondary | ICD-10-CM | POA: Diagnosis not present

## 2021-11-22 DIAGNOSIS — Z1329 Encounter for screening for other suspected endocrine disorder: Secondary | ICD-10-CM | POA: Diagnosis not present

## 2021-11-22 DIAGNOSIS — Z125 Encounter for screening for malignant neoplasm of prostate: Secondary | ICD-10-CM

## 2021-11-22 DIAGNOSIS — Z0000 Encounter for general adult medical examination without abnormal findings: Secondary | ICD-10-CM | POA: Diagnosis not present

## 2021-11-22 DIAGNOSIS — Z13228 Encounter for screening for other metabolic disorders: Secondary | ICD-10-CM

## 2021-11-22 NOTE — Progress Notes (Unsigned)
Patient is here for his complete physical examination . Patient has no other concerns to discuss.

## 2021-11-22 NOTE — Progress Notes (Signed)
Established Patient Office Visit  Subjective    Patient ID: [name], male    DOB: [birth year]  Age: 56 y.o. MRN: [id number]  CC:  Chief Complaint  Patient presents with   Annual Exam    HPI [name] presents for routine annual exam. Patient denies acute complaints or concerns.     Outpatient Encounter Medications as of 11/22/2021  Medication Sig   amLODipine (NORVASC) 10 MG tablet Take 1 tablet (10 mg total) by mouth daily.   atorvastatin (LIPITOR) 80 MG tablet Take 1 tablet (80 mg total) by mouth daily.   baclofen (LIORESAL) 10 MG tablet Take 1 tablet (10 mg total) by mouth at bedtime.   cloNIDine (CATAPRES) 0.1 MG tablet Take 1 tablet (0.1 mg total) by mouth 3 (three) times daily.   diclofenac Sodium (VOLTAREN) 1 % GEL Apply 2 g topically 4 (four) times daily.   DULoxetine (CYMBALTA) 20 MG capsule Take 1 capsule (20 mg total) by mouth daily.   folic acid (FOLVITE) 1 MG tablet Take 1 tablet (1 mg total) by mouth daily.   gabapentin (NEURONTIN) 300 MG capsule Take 1 capsule (300 mg total) by mouth 3 (three) times daily.   labetalol (NORMODYNE) 100 MG tablet Take 1 tablet by mouth twice daily   levETIRAcetam (KEPPRA) 500 MG tablet Take 1 tablet (500 mg total) by mouth 2 (two) times daily.   losartan (COZAAR) 50 MG tablet Take 1 tablet (50 mg total) by mouth 2 (two) times daily.   Multiple Vitamins-Minerals (CERTAVITE/ANTIOXIDANTS) TABS Take 1 tablet by mouth daily.   tadalafil (CIALIS) 5 MG tablet Take 5 mg by mouth every morning.   tamsulosin (FLOMAX) 0.4 MG CAPS capsule TAKE 1 CAPSULE BY MOUTH ONCE DAILY AFTER SUPPER   thiamine 100 MG tablet Take 1 tablet (100 mg total) by mouth daily.   tiZANidine (ZANAFLEX) 4 MG tablet Take 1 tablet (4 mg total) by mouth at bedtime as needed for muscle spasms.   topiramate (TOPAMAX) 50 MG tablet Take 1 tablet (50 mg total) by mouth 2 (two) times daily.   No facility-administered encounter medications on file as of 11/22/2021.     Past Medical History:  Diagnosis Date   Anxiety    Cataract    removed left eye   Chronic combined systolic and diastolic CHF (congestive heart failure) (HCC)    EF 25-30%   Diastolic dysfunction    Dyslipidemia    Hyperlipidemia    Hypertension    Neuromuscular disorder (HCC)    neuropathy left foot   Nonischemic dilated cardiomyopathy (HCC)    felt secondary to HTN with no ischemia on nuclear stress test 05/2012, EF 25-30%   Seizures (HCC)    last seizure 05-2020- on Keppra   Shortness of breath    Stroke (HCC) 05/2020    Past Surgical History:  Procedure Laterality Date   COLONOSCOPY WITH PROPOFOL N/A 12/09/2020   Procedure: COLONOSCOPY WITH PROPOFOL;  Surgeon: Benancio Deeds, MD;  [facility]: [facility];  Service: Gastroenterology;  Laterality: N/A;   KNEE ARTHROSCOPY Left 04/13/2016   Procedure: ARTHROSCOPY KNEE WITH DEBRIDEMENT;  Surgeon: Valeria Batman, MD;  Location: [facility];  Service: Orthopedics;  Laterality: Left;   KNEE ARTHROSCOPY WITH MEDIAL MENISECTOMY Right 04/13/2016   Procedure: KNEE ARTHROSCOPY WITH MEDIAL MENISECTOMY;  Surgeon: Valeria Batman, MD;  Location: [facility];  Service: Orthopedics;  Laterality: LEFT not right knee   NASAL FRACTURE SURGERY  SHOULDER SURGERY Left     Family History  Problem Relation Age of Onset   Hypertension Mother        Passed away when she is 68 secondary to hip surgery complications   CAD Mother    Heart attack Mother    Hypertension Father    Diabetes Brother    Stroke Brother    Heart attack Maternal Grandmother    Colon cancer Neg Hx    Esophageal cancer Neg Hx    Pancreatic cancer Neg Hx    Stomach cancer Neg Hx    Colon polyps Neg Hx     Social History   Socioeconomic History   Marital status: Married    Spouse name: Not on file   Number of children: Not on file   Years of education: Not on file   Highest education level: Not on file  Occupational  History   Not on file  Tobacco Use   Smoking status: Former    Types: Cigarettes    Passive exposure: Past   Smokeless tobacco: Never  Vaping Use   Vaping Use: Never used  Substance and Sexual Activity   Alcohol use: Not Currently   Drug use: Not Currently    Types: Cocaine, Marijuana, Oxycodone   Sexual activity: Yes  Other Topics Concern   Not on file  Social History Narrative   Not on file   Social Determinants of Health   Financial Resource Strain: Not on file  Food Insecurity: Not on file  Transportation Needs: Not on file  Physical Activity: Not on file  Stress: Not on file  Social Connections: Not on file  Intimate Partner Violence: Not on file    Review of Systems  All other systems reviewed and are negative.       Objective    BP 128/79   Pulse 78   Temp 98.1 F (36.7 C) (Oral)   Resp 16   Ht 5\' 10"  (1.778 m)   Wt 191 lb 12.8 oz (87 kg)   SpO2 98%   BMI 27.52 kg/m   Physical Exam Vitals and nursing note reviewed.  Constitutional:      General: He is not in acute distress. HENT:     Head: Normocephalic and atraumatic.     Right Ear: Tympanic membrane, ear canal and external ear normal.     Left Ear: Tympanic membrane, ear canal and external ear normal.     Nose: Nose normal.     Mouth/Throat:     Mouth: Mucous membranes are moist.     Pharynx: Oropharynx is clear.  Eyes:     Conjunctiva/sclera: Conjunctivae normal.     Pupils: Pupils are equal, round, and reactive to light.  Neck:     Thyroid: No thyromegaly.  Cardiovascular:     Rate and Rhythm: Normal rate and regular rhythm.     Heart sounds: Normal heart sounds. No murmur heard. Pulmonary:     Effort: Pulmonary effort is normal.     Breath sounds: Normal breath sounds.  Abdominal:     General: There is no distension.     Palpations: Abdomen is soft. There is no mass.     Tenderness: There is no abdominal tenderness.     Hernia: There is no hernia in the left inguinal area or  right inguinal area.  Genitourinary:    Penis: Normal.      Testes: Normal.  Musculoskeletal:        General: Normal range  of motion.     Cervical back: Normal range of motion and neck supple.     Right lower leg: No edema.     Left lower leg: No edema.     Comments: Utilizing cane for stability  Skin:    General: Skin is warm and dry.  Neurological:     General: No focal deficit present.     Mental Status: He is alert and oriented to person, place, and time. Mental status is at baseline.     Motor: Weakness (left-sided weakness) present.  Psychiatric:        Mood and Affect: Mood normal.        Behavior: Behavior normal.         Assessment & Plan:   1. Annual physical exam  - CMP14+EGFR  2. Screening for deficiency anemia  - CBC with Differential  3. Screening for lipid disorders   4. Screening for endocrine/metabolic/immunity disorders  - TSH  5. Screening for prostate cancer   6. Encounter for tobacco use screening  - Nicotine/cotinine metabolites    Return in about 6 months (around 05/23/2022) for follow up.   Tommie Raymond, MD

## 2021-11-23 ENCOUNTER — Telehealth: Payer: Self-pay | Admitting: Family

## 2021-11-23 ENCOUNTER — Encounter: Payer: Self-pay | Admitting: Family Medicine

## 2021-11-23 ENCOUNTER — Other Ambulatory Visit: Payer: BC Managed Care – PPO

## 2021-11-23 DIAGNOSIS — Z1329 Encounter for screening for other suspected endocrine disorder: Secondary | ICD-10-CM | POA: Diagnosis not present

## 2021-11-23 DIAGNOSIS — Z13228 Encounter for screening for other metabolic disorders: Secondary | ICD-10-CM | POA: Diagnosis not present

## 2021-11-23 DIAGNOSIS — Z130 Encounter for screening for diseases of the blood and blood-forming organs and certain disorders involving the immune mechanism: Secondary | ICD-10-CM | POA: Diagnosis not present

## 2021-11-23 DIAGNOSIS — Z0000 Encounter for general adult medical examination without abnormal findings: Secondary | ICD-10-CM | POA: Diagnosis not present

## 2021-11-23 NOTE — Therapy (Signed)
OUTPATIENT OCCUPATIONAL THERAPY NEURO EVALUATION  Patient Name: [name] MRN: [id number] DOB:12-Jun-1965, 56 y.o., male Today's Date: 11/24/2021  PCP: Rema Fendt, NP REFERRING PROVIDER: Ihor Austin, NP   OT End of Session - 11/24/21 1147     Visit Number 3    Number of Visits 9    Date for OT Re-Evaluation 01/25/21    Authorization Type BCBS    Authorization Time Period VL: PT/OT combined 30 (9 visits remaining with eval); pt has previously used 14 visits;    Authorization - Visit Number 2    Authorization - Number of Visits 9    OT Start Time 1151    OT Stop Time 1230    OT Time Calculation (min) 39 min    Activity Tolerance Patient tolerated treatment well    Behavior During Therapy WFL for tasks assessed/performed               Past Medical History:  Diagnosis Date   Anxiety    Cataract    removed left eye   Chronic combined systolic and diastolic CHF (congestive heart failure) (HCC)    EF 25-30%   Diastolic dysfunction    Dyslipidemia    Hyperlipidemia    Hypertension    Neuromuscular disorder (HCC)    neuropathy left foot   Nonischemic dilated cardiomyopathy (HCC)    felt secondary to HTN with no ischemia on nuclear stress test 05/2012, EF 25-30%   Seizures (HCC)    last seizure 05-2020- on Keppra   Shortness of breath    Stroke (HCC) 05/2020   Past Surgical History:  Procedure Laterality Date   COLONOSCOPY WITH PROPOFOL N/A 12/09/2020   Procedure: COLONOSCOPY WITH PROPOFOL;  Surgeon: Benancio Deeds, MD;  Location: [facility];  Service: Gastroenterology;  Laterality: N/A;   KNEE ARTHROSCOPY Left 04/13/2016   Procedure: ARTHROSCOPY KNEE WITH DEBRIDEMENT;  Surgeon: Valeria Batman, MD;  Location: [facility];  Service: Orthopedics;  Laterality: Left;   KNEE ARTHROSCOPY WITH MEDIAL MENISECTOMY Right 04/13/2016   Procedure: KNEE ARTHROSCOPY WITH MEDIAL MENISECTOMY;  Surgeon: Valeria Batman, MD;  Location:   S[facility]ERY [facility];  Service: Orthopedics;  Laterality: LEFT not right knee   NASAL FRACTURE SURGERY     SHOULDER SURGERY Left    Patient Active Problem List   Diagnosis Date Noted   Chronic diastolic CHF (congestive heart failure) (HCC) 11/07/2021   Left ventricular hypertrophy 11/07/2021   Osteoarthritis of left glenohumeral joint 05/04/2021   Colon cancer screening    Left hemiplegia (HCC) 07/02/2020   Cognitive deficit due to recent stroke 07/02/2020   Dysphagia, post-stroke    Vascular headache    Acute blood loss anemia    AKI (acute kidney injury) (HCC)    Essential hypertension    Seizures (HCC)    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   ICH (intracerebral hemorrhage) (HCC) 06/09/2020   Sprain of anterior talofibular ligament of left ankle 01/15/2020   Hyperlipidemia, on Lipitor 04/01/2017   Alcohol use disorder, mild, abuse 06/30/2016   Morbid obesity (HCC) 06/30/2016   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   Chronic combined systolic and diastolic CHF (congestive heart failure) (HCC) 11/08/2012   HTN (hypertension) 05/25/2012    ONSET DATE: 06/08/20   REFERRING DIAG:  I61.9 (ICD-10-CM) - Nontraumatic thalamic hemorrhage   G81.94 (ICD-10-CM) - Left hemiparesis    THERAPY DIAG:  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-dominant side (HCC)  Other abnormalities of gait and  mobility  Muscle weakness (generalized)  Chronic left shoulder pain  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   Pt accompanied by: self  PERTINENT  HISTORY: Update 10/31/2021 JM: Patient returns for follow-up after prior visit 7 months ago regarding history of ICH, seizures and headaches.  He is unaccompanied.  Reports residual left sided weakness and nerve pain. Was previously just in foot, but now going up his whole leg to his hip.  At prior visit with PMR, recommended trialing different medications but he wishes to try to avoid medications if able.  He questions restarting therapies.  Continues to ambulate with a cane, denies any recent falls.  Does mention left side of face and tongue feels swollen over the past 2 weeks, denies any change in diet or medications.  Denies any difficulty breathing or swallowing. Compliant on Keppra, no seizure activity. Has not had any recent headaches, currently on topiramate 50 mg twice daily. On atorvastatin, denies side effects.  Blood pressure well controlled, today 112/65.  Is being seen by cardiology next week for further blood pressure management.  No further concerns at this time.  PRECAUTIONS: Fall  WEIGHT BEARING RESTRICTIONS: No  PAIN:  Are you having pain? Yes: NPRS scale: 6/10 Pain location: L shoulder Pain description: stiff Aggravating factors: cold Relieving factors: heat; stretching reporting improvement with reps  FALLS: Has patient fallen in last 6 months? Yes. Number of falls 1-he was leaving from the living room going to the kitchen, he lost his balance backwards.  PLOF: Requires assistive device for independence and Vocation/Vocational requirements: pt is on disability  PATIENT GOALS: improve use of LUE and overall independence with completion of functional activities.  OBJECTIVE:   HAND DOMINANCE: Right   MOBILITY STATUS: Independent; uses quad cane; increased time   ACTIVITY TOLERANCE: Activity tolerance: no reported change since CVA unless limited to pain  FUNCTIONAL OUTCOME MEASURES: Quick Dash: 45.45% disability with use of LUE  UPPER EXTREMITY ROM     AROM  Right eval Left eval  Shoulder flexion WNL 120 with ataxia  Shoulder abduction WNL 120 with ataxia  Elbow flexion WNL WFL  Elbow extension WNL WFL  Wrist flexion WNL WFL  Wrist extension WNL N/A past neutral  Wrist pronation WNL WFL  Wrist supination WNL 45 degrees  (Blank rows = not tested)  L Digit flex and ext: [facility]; Digit opposition: BFL, very ataxic  UPPER EXTREMITY MMT:     MMT Right eval Left eval  Shoulder flexion WNL WFL within available ROM  Shoulder abduction WNL "  Elbow flexion WNL WFL  Elbow extension WNL WFL  (Blank rows = not tested)  HAND FUNCTION: Grip strength: Right: 123.8 lbs; Left: 14.7 lbs  COORDINATION: Box and Blocks:  Left 13 blocks (11/24/2021) Ataxic; unable to test fine motor coordination secondary to extent of severity  SENSATION: Light touch: WFL though reports decreased sensation in comparison to RUE  EDEMA: n/a  MUSCLE TONE: LUE: not formally tested this visit  COGNITION: Overall cognitive status: Within functional limits for tasks assessed  VISION: Subjective report: distance vision with L eye occasionally blurry; no recent diplopia Baseline vision:  OTC readers  VISION ASSESSMENT: To  be further assessed in functional context   PERCEPTION: WFL  PRAXIS: WFL  OBSERVATIONS: Pt holds LUE in guarded positioning to L side of body. Ataxic movements in LLE with going from EOB to supine. Improved with use of log rolling technique. Ataxic movements in LUE improved with cues to slow movements and when directed to attend to this side.  TODAY'S TREATMENT:  - Neuro re-education completed for duration as noted below including: Pt completed arm bike in sitting for 15 minutes with average RPM of 25 for endurance, attention, increased functional use, spasticity management, and strengthening of affected extremity. Pt requiring large ace wrap over L hand to help maintain positioning. Pt alternating direction of pedaling for 2 minutes. Extensive  cues provided throughout to maintain stability with respect to anterior/posterior trunk lean and consistent grasp maintenance. Pt able to actively pedal as desired for ~8 minutes in total.   Objective measures assessed as noted above to determine progression towards goals. Pt transitioned to sidelying on affected side requiring cueing for proper positioning to reduce ataxic movements and comfort to increase weightbearing through the affected extremity.   Pt transitioned back to EOB using log rolling technique weightbearing through LUE to encourage incorporation into completion of functional activities will increasing proprioceptive input as needed for improved coordination  While sitting EOB, pt completed lateral weight shifts B for improved functional mobility and proprioceptive input through the affected extremity.   PATIENT EDUCATION: Education details: HEP review Person educated: Patient Education method: [employer] Technology [employer] Education comprehension: verbalized understanding and needs further education  HOME EXERCISE PROGRAM: 11/16/2021 - reinitiated LUE cane HEP  GOALS: Goals reviewed with patient? Yes  SHORT TERM GOALS: Target date: 12/21/2021  Patient will demonstrate LUE ROM HEP with 25% verbal cues or less for proper execution. Baseline: Re-initiated LUE cane HEP Goal status: INITIAL  2.  Pt will demonstrate ability to pick up 9 hole peg in under 50 seconds Baseline: lacks opposition to index finger Goal status: INITIAL  3.  Will assess Box and Blocks during future visit and set goal as appropriate.  Baseline: Not tested due to time constraints Goal status: INITIAL    LONG TERM GOALS: Target date:  01/25/2022  1. Patient will demonstrate LUE coordination and strengthening HEP with 25% verbal cues or less for proper execution.  Baseline: n/a Goal status: INITIAL  2.  Patient will demonstrate at least 16%  improvement with quick Dash score (reporting 29.45%  disability or less) indicating improved functional use of affected extremity.  Baseline: 45.45% disability with use of LUE Goal status: INITIAL  3.  Pt will demonstrate at least 30 lbs L grip strength as needed to lift and carry items.  Baseline: 14.7 lbs Goal status: INITIAL  4.  Pt will perform home management and simple meal prep with good safety awareness and use of modifications/DME as needed.   Baseline:  Goal status: INITIAL    ASSESSMENT:  CLINICAL IMPRESSION: Pt benefits from skilled OT services following CVA for improved LUE coordination, pain, and safety with completion of functional activities. Pt's progression towards goals limited by carry-over outside of clinic; however, pt seems now to better understand importance of HEP completion.  PERFORMANCE DEFICITS: in functional skills including ADLs, IADLs, coordination, sensation, ROM, strength, pain, Fine motor control, Gross motor control, mobility, balance, and UE functional use.  IMPAIRMENTS: are limiting patient from ADLs, IADLs, and leisure.   PLAN:  OT FREQUENCY: 1-2x/week for up to 9 sessions  OT DURATION: 10 weeks  PLANNED INTERVENTIONS: self care/ADL training, therapeutic exercise, therapeutic activity, neuromuscular re-education, manual therapy, functional mobility training, patient/family education, and DME and/or AE instructions  RECOMMENDED OTHER SERVICES: n/a  CONSULTED AND AGREED WITH PLAN OF CARE: Patient  PLAN FOR NEXT SESSION: Weightbearing; review log roll technique; gross motor coordination; UBE; prone positioning   Delana Meyer, OT 11/24/2021, 11:51 AM

## 2021-11-23 NOTE — Telephone Encounter (Signed)
Copied from [place] [id number]. Topic: General - Other >> Nov 23, 2021  4:35 PM Ja-Kwan M wrote: Reason for CRM: Pt requests that Rutherford Nail return his call asap. Cb# [phone]

## 2021-11-24 ENCOUNTER — Encounter: Payer: Self-pay | Admitting: Physical Therapy

## 2021-11-24 ENCOUNTER — Encounter: Payer: Self-pay | Admitting: Occupational Therapy

## 2021-11-24 ENCOUNTER — Ambulatory Visit: Payer: BC Managed Care – PPO | Attending: Family | Admitting: Physical Therapy

## 2021-11-24 ENCOUNTER — Ambulatory Visit: Payer: BC Managed Care – PPO | Attending: Family

## 2021-11-24 ENCOUNTER — Ambulatory Visit: Payer: BC Managed Care – PPO | Admitting: Occupational Therapy

## 2021-11-24 DIAGNOSIS — R52 Pain, unspecified: Secondary | ICD-10-CM

## 2021-11-24 DIAGNOSIS — R208 Other disturbances of skin sensation: Secondary | ICD-10-CM

## 2021-11-24 DIAGNOSIS — M25512 Pain in left shoulder: Secondary | ICD-10-CM | POA: Diagnosis not present

## 2021-11-24 DIAGNOSIS — R278 Other lack of coordination: Secondary | ICD-10-CM | POA: Diagnosis not present

## 2021-11-24 DIAGNOSIS — R2681 Unsteadiness on feet: Secondary | ICD-10-CM | POA: Diagnosis not present

## 2021-11-24 DIAGNOSIS — R2689 Other abnormalities of gait and mobility: Secondary | ICD-10-CM | POA: Diagnosis not present

## 2021-11-24 DIAGNOSIS — M6281 Muscle weakness (generalized): Secondary | ICD-10-CM | POA: Diagnosis not present

## 2021-11-24 DIAGNOSIS — G8929 Other chronic pain: Secondary | ICD-10-CM | POA: Diagnosis not present

## 2021-11-24 DIAGNOSIS — I69154 Hemiplegia and hemiparesis following nontraumatic intracerebral hemorrhage affecting left non-dominant side: Secondary | ICD-10-CM | POA: Diagnosis not present

## 2021-11-24 DIAGNOSIS — I69354 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-dominant side: Secondary | ICD-10-CM | POA: Insufficient documentation

## 2021-11-24 DIAGNOSIS — R41842 Visuospatial deficit: Secondary | ICD-10-CM | POA: Insufficient documentation

## 2021-11-24 NOTE — Telephone Encounter (Signed)
Att to contact pt no ans

## 2021-11-24 NOTE — Therapy (Signed)
OUTPATIENT PHYSICAL THERAPY NEURO EVALUATION   Patient Name: [name] MRN: [id number] DOB:1965/03/15, 56 y.o., male Today's Date: 11/24/2021   PCP: Rema Fendt, NP REFERRING PROVIDER: Ihor Austin, NP    PT End of Session - 11/24/21 2057     Visit Number 3    Number of Visits 9    Date for PT Re-Evaluation 01/13/22    Authorization Type BCBS 30 visits PT/OT combined - If seen for PT & OT on the same it constitutes one visit, notes say 8 used, pt states 6 remaining, when counted from start of calendar year 16 left.    PT Start Time 1120   pt arrived 25" late for PT appt   PT Stop Time 1145    PT Time Calculation (min) 25 min    Equipment Utilized During Treatment Gait belt    Activity Tolerance Patient tolerated treatment well    Behavior During Therapy WFL for tasks assessed/performed                 Past Medical History:  Diagnosis Date   Anxiety    Cataract    removed left eye   Chronic combined systolic and diastolic CHF (congestive heart failure) (HCC)    EF 25-30%   Diastolic dysfunction    Dyslipidemia    Hyperlipidemia    Hypertension    Neuromuscular disorder (HCC)    neuropathy left foot   Nonischemic dilated cardiomyopathy (HCC)    felt secondary to HTN with no ischemia on nuclear stress test 05/2012, EF 25-30%   Seizures (HCC)    last seizure 05-2020- on Keppra   Shortness of breath    Stroke (HCC) 05/2020   Past Surgical History:  Procedure Laterality Date   COLONOSCOPY WITH PROPOFOL N/A 12/09/2020   Procedure: COLONOSCOPY WITH PROPOFOL;  Surgeon: Benancio Deeds, MD;  Location: [facility];  Service: Gastroenterology;  Laterality: N/A;   KNEE ARTHROSCOPY Left 04/13/2016   Procedure: ARTHROSCOPY KNEE WITH DEBRIDEMENT;  Surgeon: Valeria Batman, MD;  Location: [facility];  Service: Orthopedics;  Laterality: Left;   KNEE ARTHROSCOPY WITH MEDIAL MENISECTOMY Right 04/13/2016   Procedure: KNEE ARTHROSCOPY WITH MEDIAL  MENISECTOMY;  Surgeon: Valeria Batman, MD;  Location: [facility];  Service: Orthopedics;  Laterality: LEFT not right knee   NASAL FRACTURE SURGERY     SHOULDER SURGERY Left    Patient Active Problem List   Diagnosis Date Noted   Chronic diastolic CHF (congestive heart failure) (HCC) 11/07/2021   Left ventricular hypertrophy 11/07/2021   Osteoarthritis of left glenohumeral joint 05/04/2021   Colon cancer screening    Left hemiplegia (HCC) 07/02/2020   Cognitive deficit due to recent stroke 07/02/2020   Dysphagia, post-stroke    Vascular headache    Acute blood loss anemia    AKI (acute kidney injury) (HCC)    Essential hypertension    Seizures (HCC)    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   ICH (intracerebral hemorrhage) (HCC) 06/09/2020   Sprain of anterior talofibular ligament of left ankle 01/15/2020   Hyperlipidemia, on Lipitor 04/01/2017   Alcohol use disorder, mild, abuse 06/30/2016   Morbid obesity (HCC) 06/30/2016   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   Chronic combined systolic and diastolic CHF (congestive heart failure) (HCC) 11/08/2012   HTN (hypertension) 05/25/2012    ONSET DATE: 10/31/2021   REFERRING DIAG: I61.9 (ICD-10-CM) - Nontraumatic thalamic hemorrhage (HCC) G81.94 (ICD-10-CM) - Left hemiparesis (HCC)   THERAPY DIAG:  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-dominant side (HCC)  Other abnormalities of gait and mobility  Muscle weakness (generalized)  Rationale for Evaluation and Treatment Rehabilitation  SUBJECTIVE:                                                                                                                                                                                              SUBJECTIVE STATEMENT: Pt reports he has purchased single point cane and a rubber quad tip from [employer] - left it in car; pt was informed to bring cane and tip in to PT after OT appt  today for correct assembly & adjustment  Pt accompanied by: self  PERTINENT HISTORY: HTN, Non ischemic dilated cardiomyopathy, chronic combined CHF, ETOH, hyperlipidemia, ICH, seizures, CVA 06/09/2020  PAIN:  Are you having pain? No-wouldn't consider neuropathy painful  PRECAUTIONS: Fall  WEIGHT BEARING RESTRICTIONS No  FALLS: Has patient fallen in last 6 months? Yes. Number of falls 1-he was leaving from the living room going to the kitchen, he lost his balance backwards.  LIVING ENVIRONMENT: Lives with: lives with their spouse-Wife's Kimberlee Nearing Lives in: House/apartment Stairs: Yes: External: 4 steps; on left going up Has following equipment at home: Quad cane small base, Hemi walker, Wheelchair (manual), and Shower bench  PLOF: Requires assistive device for independence and Vocation/Vocational requirements: pt is on disability  PATIENT GOALS "To be able to walk without the cane."  OBJECTIVE:    TherEx: Lt hamstring stretch - runner's stretch 30 sec hold x1 rep with Rt foot on 2nd step  Tap ups to 1st step with RLE for LLE SLS and strengthening in closed chain - 5 reps;  tap ups to 2nd step 5 reps with RLE:  tap ups to 3rd step with RLE 10 reps with moderate difficulty lifting LLE high enough to tap this step   Step ups to 6" step with LLE 10 reps - pt used Rt hand rail  GAIT: Gait pattern: decreased hip/knee flexion- Left, decreased ankle dorsiflexion- Left, decreased trunk rotation, wide BOS, abducted- Left, and poor foot clearance- Left Distance walked: 115'  Assistive device utilized: [facility] with rubber quad base Level of assistance: SBA and CGA    PATIENT EDUCATION: Education details:  gave pt sheet with hamstring and heel cord stretches -  Person educated: Patient Education method: Explanation, demonstration, handout Education comprehension: verbalized understanding and needs further education   HOME EXERCISE PROGRAM: Pt requesting review of prior:   [id number]    GOALS: Goals reviewed with patient? Yes  SHORT TERM GOALS: Target date: 12/16/2021  Pt will be independent with strength  and balance HEP to promote maintenance of gains outside the clinic.  Baseline:  To be reviewed and modified. Goal status: INITIAL  2.  Pt will demonstrate TUG of </=19 seconds in order to decrease risk of falls and improve functional mobility using LRAD. Baseline: 23.44 sec w/ SBQC (10/18) Goal status: INITIAL  3.  Pt will demonstrate a gait speed of >/=1.84 feet/sec using LRAD in order to decrease risk for falls. Baseline: 1.54 ft/sec SBQC Goal status: INITIAL  4.  BERG to be assessed w/ LTG set as appropriate. Baseline: To be assessed. Goal status: INITIAL  5.  Pt will ambulate >/=345' w/ SBQC at no more than SBA level over level, indoor surfaces to improve safety with household and limited community ambulation. Baseline: various clinic distances SBA/CGA w/ SBQC Goal status: INITIAL  LONG TERM GOALS: Target date: 01/13/2022  Pt to be independent with advance strength and balance HEP. Baseline: To be modified. Goal status: INITIAL  2.  Pt will demonstrate TUG of </=15 seconds in order to decrease risk of falls and improve functional mobility using LRAD. Baseline: 23.44 sec w/ SBQC Goal status: INITIAL  3.  Pt will demonstrate a gait speed of >/=2.14 feet/sec using LRAD in order to decrease risk for falls. Baseline: 1.54 ft/sec SBQC Goal status: INITIAL  4.  BERG to be assessed w/ goal set as appropriate. Baseline: To be assessed. Goal status: INITIAL  5.   Pt will ambulate >/= 460' w/ LRAD ModI over level, indoor surfaces to improve safety with household and limited community ambulation. Baseline: various clinic distances SBA/CGA w/ SBQC Goal status: INITIAL  ASSESSMENT:  CLINICAL IMPRESSION: Pt arrived 20" late for PT appt; pt gait trained with SPC with rubber quad tip with SBA - pt had no LOB with use of this device.  Pt brought  his cane and quad tip which he purchased from [employer] after PT session for adjustment.  Tip was placed on SPC and cane was adjusted properly.  Pt amb. Out of clinic with his quad cane but carrying the new SPC.  Pt is progressing well towards goals.  Cont with POC.   OBJECTIVE IMPAIRMENTS Abnormal gait, decreased activity tolerance, decreased balance, decreased coordination, decreased endurance, decreased mobility, difficulty walking, decreased strength, decreased safety awareness, increased edema, impaired sensation, impaired tone, improper body mechanics, and postural dysfunction.   ACTIVITY LIMITATIONS carrying, lifting, stairs, transfers, and locomotion level  PARTICIPATION LIMITATIONS: community activity and occupation  PERSONAL FACTORS Age, Fitness, Past/current experiences, Time since onset of injury/illness/exacerbation, and 1-2 comorbidities: HTN, chronic CHF/cardiomyopathy  are also affecting patient's functional outcome.   REHAB POTENTIAL: Fair See personal factors  CLINICAL DECISION MAKING: Stable/uncomplicated  EVALUATION COMPLEXITY: Low  PLAN: PT FREQUENCY: 1x/week  PT DURATION: 8 weeks  PLANNED INTERVENTIONS: Therapeutic exercises, Therapeutic activity, Neuromuscular re-education, Balance training, Gait training, Patient/Family education, Self Care, Joint mobilization, Stair training, Vestibular training, Orthotic/Fit training, DME instructions, Electrical stimulation, Manual therapy, and Re-evaluation  PLAN FOR NEXT SESSION: check HEP - stretches (1 sheet); cont gait with rubber quad base; gait with no device   Renaye Janicki, Donavan Burnet, PT 11/24/2021, 9:01 PM

## 2021-11-27 DIAGNOSIS — I610 Nontraumatic intracerebral hemorrhage in hemisphere, subcortical: Secondary | ICD-10-CM | POA: Diagnosis not present

## 2021-11-27 DIAGNOSIS — R569 Unspecified convulsions: Secondary | ICD-10-CM | POA: Diagnosis not present

## 2021-11-27 DIAGNOSIS — I5042 Chronic combined systolic (congestive) and diastolic (congestive) heart failure: Secondary | ICD-10-CM | POA: Diagnosis not present

## 2021-11-28 ENCOUNTER — Encounter: Payer: BC Managed Care – PPO | Admitting: Family

## 2021-11-28 LAB — CMP14+EGFR
ALT: 24 IU/L (ref 0–44)
AST: 22 IU/L (ref 0–40)
Albumin/Globulin Ratio: 1.8 (ref 1.2–2.2)
Albumin: 4.7 g/dL (ref 3.8–4.9)
Alkaline Phosphatase: 73 IU/L (ref 44–121)
BUN/Creatinine Ratio: 13 (ref 9–20)
BUN: 14 mg/dL (ref 6–24)
Bilirubin Total: 0.5 mg/dL (ref 0.0–1.2)
CO2: 24 mmol/L (ref 20–29)
Calcium: 10.2 mg/dL (ref 8.7–10.2)
Chloride: 106 mmol/L (ref 96–106)
Creatinine, Ser: 1.11 mg/dL (ref 0.76–1.27)
Globulin, Total: 2.6 g/dL (ref 1.5–4.5)
Glucose: 77 mg/dL (ref 70–99)
Potassium: 4 mmol/L (ref 3.5–5.2)
Sodium: 144 mmol/L (ref 134–144)
Total Protein: 7.3 g/dL (ref 6.0–8.5)
eGFR: 77 mL/min/{1.73_m2} (ref >59)

## 2021-11-28 LAB — CBC WITH DIFFERENTIAL/PLATELET
Basophils Absolute: 0 10*3/uL (ref 0.0–0.2)
Basos: 0 %
EOS (ABSOLUTE): 0.1 10*3/uL (ref 0.0–0.4)
Eos: 2 %
Hematocrit: 42 % (ref 37.5–51.0)
Hemoglobin: 13.9 g/dL (ref 13.0–17.7)
Immature Grans (Abs): 0 10*3/uL (ref 0.0–0.1)
Immature Granulocytes: 0 %
Lymphocytes Absolute: 1.6 10*3/uL (ref 0.7–3.1)
Lymphs: 33 %
MCH: 30.3 pg (ref 26.6–33.0)
MCHC: 33.1 g/dL (ref 31.5–35.7)
MCV: 92 fL (ref 79–97)
Monocytes Absolute: 0.5 10*3/uL (ref 0.1–0.9)
Monocytes: 10 %
Neutrophils Absolute: 2.5 10*3/uL (ref 1.4–7.0)
Neutrophils: 55 %
Platelets: 314 10*3/uL (ref 150–450)
RBC: 4.58 x10E6/uL (ref 4.14–5.80)
RDW: 11.2 % — ABNORMAL LOW (ref 11.6–15.4)
WBC: 4.7 10*3/uL (ref 3.4–10.8)

## 2021-11-28 LAB — TSH: TSH: 0.824 u[IU]/mL (ref 0.450–4.500)

## 2021-11-28 LAB — NICOTINE/COTININE METABOLITES
Cotinine: 3.7 ng/mL
Nicotine: <1 ng/mL

## 2021-11-29 DIAGNOSIS — H5212 Myopia, left eye: Secondary | ICD-10-CM | POA: Diagnosis not present

## 2021-12-01 ENCOUNTER — Ambulatory Visit: Payer: BC Managed Care – PPO | Admitting: Occupational Therapy

## 2021-12-01 ENCOUNTER — Encounter: Payer: Self-pay | Admitting: Occupational Therapy

## 2021-12-01 ENCOUNTER — Ambulatory Visit: Payer: BC Managed Care – PPO | Admitting: Physical Therapy

## 2021-12-01 DIAGNOSIS — M25512 Pain in left shoulder: Secondary | ICD-10-CM | POA: Diagnosis not present

## 2021-12-01 DIAGNOSIS — R208 Other disturbances of skin sensation: Secondary | ICD-10-CM | POA: Diagnosis not present

## 2021-12-01 DIAGNOSIS — R41842 Visuospatial deficit: Secondary | ICD-10-CM

## 2021-12-01 DIAGNOSIS — I69154 Hemiplegia and hemiparesis following nontraumatic intracerebral hemorrhage affecting left non-dominant side: Secondary | ICD-10-CM

## 2021-12-01 DIAGNOSIS — M6281 Muscle weakness (generalized): Secondary | ICD-10-CM

## 2021-12-01 DIAGNOSIS — R2681 Unsteadiness on feet: Secondary | ICD-10-CM

## 2021-12-01 DIAGNOSIS — R278 Other lack of coordination: Secondary | ICD-10-CM

## 2021-12-01 DIAGNOSIS — I69354 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-dominant side: Secondary | ICD-10-CM | POA: Diagnosis not present

## 2021-12-01 DIAGNOSIS — R2689 Other abnormalities of gait and mobility: Secondary | ICD-10-CM

## 2021-12-01 DIAGNOSIS — R52 Pain, unspecified: Secondary | ICD-10-CM | POA: Diagnosis not present

## 2021-12-01 DIAGNOSIS — G8929 Other chronic pain: Secondary | ICD-10-CM | POA: Diagnosis not present

## 2021-12-01 NOTE — Therapy (Signed)
OUTPATIENT PHYSICAL THERAPY NEURO TREATMENT NOTE   Patient Name: [name] MRN: [id number] DOB:1965-10-20, 56 y.o., male Today's Date: 12/02/2021   PCP: Rema Fendt, NP REFERRING PROVIDER: Ihor Austin, NP    PT End of Session - 12/02/21 1608     Visit Number 4    Number of Visits 9    Date for PT Re-Evaluation 01/13/22    Authorization Type [employer] 30 visits PT/OT combined - If seen for PT & OT on the same it constitutes one visit, notes say 8 used, pt states 6 remaining, when counted from start of calendar year 16 left.    PT Start Time 1535    PT Stop Time 1618    PT Time Calculation (min) 43 min    Equipment Utilized During Treatment Gait belt   spc with quad tip   Activity Tolerance Patient tolerated treatment well    Behavior During Therapy WFL for tasks assessed/performed                  Past Medical History:  Diagnosis Date   Anxiety    Cataract    removed left eye   Chronic combined systolic and diastolic CHF (congestive heart failure) (HCC)    EF 25-30%   Diastolic dysfunction    Dyslipidemia    Hyperlipidemia    Hypertension    Neuromuscular disorder (HCC)    neuropathy left foot   Nonischemic dilated cardiomyopathy (HCC)    felt secondary to HTN with no ischemia on nuclear stress test 05/2012, EF 25-30%   Seizures (HCC)    last seizure 05-2020- on Keppra   Shortness of breath    Stroke (HCC) 05/2020   Past Surgical History:  Procedure Laterality Date   COLONOSCOPY WITH PROPOFOL N/A 12/09/2020   Procedure: COLONOSCOPY WITH PROPOFOL;  Surgeon: Benancio Deeds, MD;  Location: [facility];  Service: Gastroenterology;  Laterality: N/A;   KNEE ARTHROSCOPY Left 04/13/2016   Procedure: ARTHROSCOPY KNEE WITH DEBRIDEMENT;  Surgeon: Valeria Batman, MD;  Location: [facility];  Service: Orthopedics;  Laterality: Left;   KNEE ARTHROSCOPY WITH MEDIAL MENISECTOMY Right 04/13/2016   Procedure: KNEE ARTHROSCOPY WITH MEDIAL  MENISECTOMY;  Surgeon: Valeria Batman, MD;  Location:  SURGERY CENTER;  Service: Orthopedics;  Laterality: LEFT not right knee   NASAL FRACTURE SURGERY     SHOULDER SURGERY Left    Patient Active Problem List   Diagnosis Date Noted   Chronic diastolic CHF (congestive heart failure) (HCC) 11/07/2021   Left ventricular hypertrophy 11/07/2021   Osteoarthritis of left glenohumeral joint 05/04/2021   Colon cancer screening    Left hemiplegia (HCC) 07/02/2020   Cognitive deficit due to recent stroke 07/02/2020   Dysphagia, post-stroke    Vascular headache    Acute blood loss anemia    AKI (acute kidney injury) (HCC)    Essential hypertension    Seizures (HCC)    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   ICH (intracerebral hemorrhage) (HCC) 06/09/2020   Sprain of anterior talofibular ligament of left ankle 01/15/2020   Hyperlipidemia, on Lipitor 04/01/2017   Alcohol use disorder, mild, abuse 06/30/2016   Morbid obesity (HCC) 06/30/2016   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   Chronic combined systolic and diastolic CHF (congestive heart failure) (HCC) 11/08/2012   HTN (hypertension) 05/25/2012    ONSET DATE: 10/31/2021   REFERRING DIAG: I61.9 (ICD-10-CM) - Nontraumatic thalamic hemorrhage (HCC) G81.94 (ICD-10-CM) - Left hemiparesis (HCC)   THERAPY DIAG:  Hemiplegia  and hemiparesis following cerebral infarction affecting left non-dominant side (HCC)  Other abnormalities of gait and mobility  Muscle weakness (generalized)  Unsteadiness on feet  Rationale for Evaluation and Treatment Rehabilitation  SUBJECTIVE:                                                                                                                                                                                              SUBJECTIVE STATEMENT: Pt reports he has used his cane with rubber quad tip only a little in his house; continues to use SBQC Pt accompanied by:  self  PERTINENT HISTORY: HTN, Non ischemic dilated cardiomyopathy, chronic combined CHF, ETOH, hyperlipidemia, ICH, seizures, CVA 06/09/2020  PAIN:  Are you having pain? No-wouldn't consider neuropathy painful  PRECAUTIONS: Fall  WEIGHT BEARING RESTRICTIONS No  FALLS: Has patient fallen in last 6 months? Yes. Number of falls 1-he was leaving from the living room going to the kitchen, he lost his balance backwards.  LIVING ENVIRONMENT: Lives with: lives with their spouse-Wife's Albertina Parr Lives in: House/apartment Stairs: Yes: External: 4 steps; on left going up Has following equipment at home: Quad cane small base, Hemi walker, Wheelchair (manual), and Shower bench  PLOF: Requires assistive device for independence and Vocation/Vocational requirements: pt is on disability  PATIENT GOALS "To be able to walk without the cane."  OBJECTIVE:    TherEx: Lt hamstring stretch - runner's stretch 30 sec hold x1 rep with Rt foot on 2nd step  Lt gastroc stretch - used bottom shelf of cabinet - 20 sec hold x 1 rep  Tap ups to 1st step with RLE for LLE SLS and strengthening in closed chain - 5 reps;  tap ups to 2nd step 10 reps with RLE:  tap ups to 3rd step with RLE 5 reps with moderate difficulty lifting LLE high enough to tap this step   Step ups to 6" step with LLE 10 reps - pt used Rt hand rail  GAIT: Gait pattern: decreased hip/knee flexion- Left, decreased ankle dorsiflexion- Left, decreased trunk rotation, wide BOS, abducted- Left, and poor foot clearance- Left Distance walked: 115' x 3 reps with SPC:  START of SESSION:  68' with no device with CGA with cues for increased Rt heel contact/push off in stance for more fluid gait pattern Assistive device utilized: No device used initially:  SPC with rubber quad base for 230' Level of assistance: SBA and CGA  Step trained - 4 steps; 3 reps with use of SPC (no hand rail used); with SBA; pt used RLE 1st with ascension (cane last);  with descension cane first, LLE then RLE  PATIENT EDUCATION: Education details:  gave pt sheet with hamstring and heel cord stretches -  Person educated: Patient Education method: Explanation, demonstration, handout Education comprehension: verbalized understanding and needs further education   HOME EXERCISE PROGRAM: Pt requesting review of prior:  [id number]    GOALS: Goals reviewed with patient? Yes  SHORT TERM GOALS: Target date: 12/16/2021  Pt will be independent with strength and balance HEP to promote maintenance of gains outside the clinic.  Baseline:  To be reviewed and modified. Goal status: INITIAL  2.  Pt will demonstrate TUG of </=19 seconds in order to decrease risk of falls and improve functional mobility using LRAD. Baseline: 23.44 sec w/ SBQC (10/18) Goal status: INITIAL  3.  Pt will demonstrate a gait speed of >/=1.84 feet/sec using LRAD in order to decrease risk for falls. Baseline: 1.54 ft/sec SBQC Goal status: INITIAL  4.  BERG to be assessed w/ LTG set as appropriate. Baseline: To be assessed. Goal status: INITIAL  5.  Pt will ambulate >/=345' w/ SBQC at no more than SBA level over level, indoor surfaces to improve safety with household and limited community ambulation. Baseline: various clinic distances SBA/CGA w/ SBQC Goal status: INITIAL  LONG TERM GOALS: Target date: 01/13/2022  Pt to be independent with advance strength and balance HEP. Baseline: To be modified. Goal status: INITIAL  2.  Pt will demonstrate TUG of </=15 seconds in order to decrease risk of falls and improve functional mobility using LRAD. Baseline: 23.44 sec w/ SBQC Goal status: INITIAL  3.  Pt will demonstrate a gait speed of >/=2.14 feet/sec using LRAD in order to decrease risk for falls. Baseline: 1.54 ft/sec SBQC Goal status: INITIAL  4.  BERG to be assessed w/ goal set as appropriate. Baseline: To be assessed. Goal status: INITIAL  5.   Pt will ambulate >/= 460'  w/ LRAD ModI over level, indoor surfaces to improve safety with household and limited community ambulation. Baseline: various clinic distances SBA/CGA w/ SBQC Goal status: INITIAL  ASSESSMENT:  CLINICAL IMPRESSION: PT session focused on gait training without device initially for 230' then used [facility] with rubber quad tip for 350' with 1 moderate LOB but pt able to recover independently.  Trialed descending step with RLE first but pt was too unstable so kept sequence on SPC, LLE and then RLE for sequence with descension of steps.   Cont with POC.   OBJECTIVE IMPAIRMENTS Abnormal gait, decreased activity tolerance, decreased balance, decreased coordination, decreased endurance, decreased mobility, difficulty walking, decreased strength, decreased safety awareness, increased edema, impaired sensation, impaired tone, improper body mechanics, and postural dysfunction.   ACTIVITY LIMITATIONS carrying, lifting, stairs, transfers, and locomotion level  PARTICIPATION LIMITATIONS: community activity and occupation  PERSONAL FACTORS Age, Fitness, Past/current experiences, Time since onset of injury/illness/exacerbation, and 1-2 comorbidities: HTN, chronic CHF/cardiomyopathy  are also affecting patient's functional outcome.   REHAB POTENTIAL: Fair See personal factors  CLINICAL DECISION MAKING: Stable/uncomplicated  EVALUATION COMPLEXITY: Low  PLAN: PT FREQUENCY: 1x/week  PT DURATION: 8 weeks  PLANNED INTERVENTIONS: Therapeutic exercises, Therapeutic activity, Neuromuscular re-education, Balance training, Gait training, Patient/Family education, Self Care, Joint mobilization, Stair training, Vestibular training, Orthotic/Fit training, DME instructions, Electrical stimulation, Manual therapy, and Re-evaluation  PLAN FOR NEXT SESSION: check HEP - stretches (1 sheet); cont gait with rubber quad base; gait with no device   Cahlil Sattar, Donavan Burnet, PT 12/02/2021, 4:11 PM

## 2021-12-01 NOTE — Therapy (Signed)
OUTPATIENT OCCUPATIONAL THERAPY NEURO EVALUATION  Patient Name: [name] MRN: [id number] DOB:1965/08/29, 56 y.o., male Today's Date: 12/02/2021  PCP: Camillia Herter, NP REFERRING PROVIDER: Frann Rider, NP   OT End of Session - 12/01/21 1455     Visit Number 4    Number of Visits 9    Date for OT Re-Evaluation 01/25/21    Authorization Type BC[employer]    Authorization Time Period VL: PT/OT combined 30 (9 visits remaining with eval); pt has previously used 14 visits;    Authorization - Visit Number 4    Authorization - Number of Visits 9    OT Start Time [id number]    OT Stop Time [id number]    OT Time Calculation (min) 39 min    Activity Tolerance Patient tolerated treatment well    Behavior During Therapy WFL for tasks assessed/performed                Past Medical History:  Diagnosis Date   Anxiety    Cataract    removed left eye   Chronic combined systolic and diastolic CHF (congestive heart failure) (HCC)    EF [id number]   Diastolic dysfunction    Dyslipidemia    Hyperlipidemia    Hypertension    Neuromuscular disorder (HCC)    neuropathy left foot   Nonischemic dilated cardiomyopathy ([place])    felt secondary to HTN with no ischemia on nuclear stress test 05/2012, EF 25-30%   Seizures ([place])    last seizure 05-2020- on Keppra   Shortness of breath    Stroke ([place]) 05/2020   Past Surgical History:  Procedure Laterality Date   COLONOSCOPY WITH PROPOFOL N/A 12/09/2020   Procedure: COLONOSCOPY WITH PROPOFOL;  Surgeon: Yetta Flock, MD;  Location: [facility];  Service: Gastroenterology;  Laterality: N/A;   KNEE ARTHROSCOPY Left 04/13/2016   Procedure: ARTHROSCOPY KNEE WITH DEBRIDEMENT;  Surgeon: Garald Balding, MD;  Location: [place];  Service: Orthopedics;  Laterality: Left;   KNEE ARTHROSCOPY WITH MEDIAL MENISECTOMY Right 04/13/2016   Procedure: KNEE ARTHROSCOPY WITH MEDIAL MENISECTOMY;  Surgeon: Garald Balding, MD;  Location: [place];  Service: Orthopedics;  Laterality: LEFT not right knee   NASAL FRACTURE SURGERY     SHOULDER SURGERY Left    Patient Active Problem List   Diagnosis Date Noted   Chronic diastolic CHF (congestive heart failure) ([place]) 11/07/2021   Left ventricular hypertrophy 11/07/2021   Osteoarthritis of left glenohumeral joint 05/04/2021   Colon cancer screening    Left hemiplegia ([place]) 07/02/2020   Cognitive deficit due to recent stroke 07/02/2020   Dysphagia, post-stroke    Vascular headache    Acute blood loss anemia    AKI (acute kidney injury) ([place])    Essential hypertension    Seizures (HCC)    Thalamic hemorrhage ([place]) 06/22/2020   ICH (intracerebral hemorrhage) ([place]) 06/09/2020   Sprain of anterior talofibular ligament of left ankle 01/15/2020   Hyperlipidemia, on Lipitor 04/01/2017   Alcohol use disorder, mild, abuse 06/30/2016   Morbid obesity ([place]) 06/30/2016   Nonischemic dilated cardiomyopathy ([place]) 11/08/2012   Chronic combined systolic and diastolic CHF (congestive heart failure) ([place]) 11/08/2012   HTN (hypertension) 05/25/2012    ONSET DATE: 06/08/20   REFERRING DIAG:  I61.9 (ICD-10-CM) - Nontraumatic thalamic hemorrhage   G81.94 (ICD-10-CM) - Left hemiparesis    THERAPY DIAG:  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-dominant side (HCC)  Other abnormalities of gait  and mobility  Muscle weakness (generalized)  Chronic left shoulder pain  Hemiplegia and hemiparesis following nontraumatic intracerebral hemorrhage affecting left non-dominant side (HCC)  Other disturbances of skin sensation  Visuospatial deficit  Pain  Other lack of coordination  Unsteadiness on feet  Rationale for Evaluation and Treatment Rehabilitation  SUBJECTIVE:   SUBJECTIVE STATEMENT: He has not been able to utilize log rolling techniques at home and realizes he sleeps on the opposite side of the bed than what he practiced these  techniques during his previous therapy sessions.   Pt accompanied by: self  PERTINENT HISTORY: Update 10/31/2021 JM: Patient returns for follow-up after prior visit 7 months ago regarding history of ICH, seizures and headaches.  He is unaccompanied.  Reports residual left sided weakness and nerve pain. Was previously just in foot, but now going up his whole leg to his hip.  At prior visit with PMR, recommended trialing different medications but he wishes to try to avoid medications if able.  He questions restarting therapies.  Continues to ambulate with a cane, denies any recent falls.  Does mention left side of face and tongue feels swollen over the past 2 weeks, denies any change in diet or medications.  Denies any difficulty breathing or swallowing. Compliant on Keppra, no seizure activity. Has not had any recent headaches, currently on topiramate 50 mg twice daily. On atorvastatin, denies side effects.  Blood pressure well controlled, today 112/65.  Is being seen by cardiology next week for further blood pressure management.  No further concerns at this time.  PRECAUTIONS: Fall  WEIGHT BEARING RESTRICTIONS: No  PAIN:  Are you having pain? Yes: NPRS scale: 3/10 Pain location: L shoulder Pain description: stiff Aggravating factors: cold Relieving factors: heat; stretching reporting improvement with reps  FALLS: Has patient fallen in last 6 months? Yes. Number of falls 1-he was leaving from the living room going to the kitchen, he lost his balance backwards.  PLOF: Requires assistive device for independence and Vocation/Vocational requirements: pt is on disability  PATIENT GOALS: improve use of LUE and overall independence with completion of functional activities.   OBJECTIVE:   HAND DOMINANCE: Right   MOBILITY STATUS: Independent; uses quad cane; increased time   ACTIVITY TOLERANCE: Activity tolerance: no reported change since CVA unless limited to pain  FUNCTIONAL OUTCOME  MEASURES: Quick Dash: 45.45% disability with use of LUE  UPPER EXTREMITY ROM     AROM Right eval Left eval  Shoulder flexion WNL 120 with ataxia  Shoulder abduction WNL 120 with ataxia  Elbow flexion WNL WFL  Elbow extension WNL WFL  Wrist flexion WNL WFL  Wrist extension WNL N/A past neutral  Wrist pronation WNL WFL  Wrist supination WNL 45 degrees  (Blank rows = not tested)  L Digit flex and ext: [facility]; Digit opposition: BFL, very ataxic  UPPER EXTREMITY MMT:     MMT Right eval Left eval  Shoulder flexion WNL WFL within available ROM  Shoulder abduction WNL "  Elbow flexion WNL WFL  Elbow extension WNL WFL  (Blank rows = not tested)  HAND FUNCTION: Grip strength: Right: 123.8 lbs; Left: 14.7 lbs  COORDINATION: Box and Blocks:  Left 13 blocks (11/24/2021) Ataxic; unable to test fine motor coordination secondary to extent of severity  SENSATION: Light touch: WFL though reports decreased sensation in comparison to RUE  EDEMA: n/a  MUSCLE TONE: LUE: not formally tested this visit  COGNITION: Overall cognitive status: Within functional limits for tasks assessed  VISION: Subjective report:  distance vision with L eye occasionally blurry; no recent diplopia Baseline vision:  OTC readers  VISION ASSESSMENT: To be further assessed in functional context   PERCEPTION: WFL  PRAXIS: WFL  OBSERVATIONS:Less cues to control LUE gross motor coordination though does require cues to slow pace with task completion  TODAY'S TREATMENT:  - Neuro re-education completed for duration as noted below including: Pt completed arm bike in sitting for 5 minutes with average RPM of 28 for endurance, attention, increased functional use, spasticity management, and strengthening of affected extremity. Pt requiring large ace wrap over L hand to help maintain positioning. Pt alternating direction of pedaling for 2.5 minutes. Extensive cues provided throughout to maintain stability with  respect to anterior/posterior trunk lean and consistent grasp maintenance.  Pt transitioned to sidelying on affected side requiring only min cueing for proper positioning to reduce ataxic movements and comfort to increase weightbearing through the affected extremity.   Pt transitioned back to EOB using log rolling technique from right sidelying as this is the side of the bed he sleeps on at home. Therapist encouraged incorporation of LUE into completion with increased proprioceptive input as needed for improved ROM, strength, and coordination  Pt transitioned to prone on knees and elbows with increased time and max multimodal cueing due to poor motor planning as needed to improve overall mobility, strength, and safety with completion of functional activities.  Pt completed chest presses, horizontal abduction and adduction, as well as shoulder flexion while in supine using cane for improved ROM. Pt requiring less cueing for proper completion  While sitting EOB, pt completed lateral scooting from [facility] to FOB using BUE for improved functional mobility and proprioceptive input through the affected extremity.    PATIENT EDUCATION: Education details: HEP review Person educated: Patient Education method: Theatre stage manager Education comprehension: verbalized understanding and needs further education  HOME EXERCISE PROGRAM: 11/16/2021 - reinitiated LUE cane HEP  GOALS: Goals reviewed with patient? Yes  SHORT TERM GOALS: Target date: 12/21/2021  Patient will demonstrate LUE ROM HEP with 25% verbal cues or less for proper execution. Baseline: Re-initiated LUE cane HEP Goal status: INITIAL  2.  Pt will demonstrate ability to pick up 9 hole peg in under 50 seconds Baseline: lacks opposition to index finger Goal status: INITIAL  3.  Will assess Box and Blocks during future visit and set goal as appropriate.  Baseline: Not tested due to time constraints Goal status: INITIAL    LONG  TERM GOALS: Target date:  01/25/2022  1. Patient will demonstrate LUE coordination and strengthening HEP with 25% verbal cues or less for proper execution.  Baseline: n/a Goal status: INITIAL  2.  Patient will demonstrate at least 16%  improvement with quick Dash score (reporting 29.45% disability or less) indicating improved functional use of affected extremity.  Baseline: 45.45% disability with use of LUE Goal status: INITIAL  3.  Pt will demonstrate at least 30 lbs L grip strength as needed to lift and carry items.  Baseline: 14.7 lbs Goal status: INITIAL  4.  Pt will perform home management and simple meal prep with good safety awareness and use of modifications/DME as needed.   Baseline:  Goal status: INITIAL    ASSESSMENT:  CLINICAL IMPRESSION: Pt benefits from skilled OT services following CVA for improved LUE coordination, pain, and safety with completion of functional activities. Pt's progression towards goals limited by carry-over outside of clinic; however, pt seems now to better understand importance of HEP completion.  PERFORMANCE DEFICITS:  in functional skills including ADLs, IADLs, coordination, sensation, ROM, strength, pain, Fine motor control, Gross motor control, mobility, balance, and UE functional use.  IMPAIRMENTS: are limiting patient from ADLs, IADLs, and leisure.   PLAN:  OT FREQUENCY: 1-2x/week for up to 9 sessions  OT DURATION: 10 weeks   PLANNED INTERVENTIONS: self care/ADL training, therapeutic exercise, therapeutic activity, neuromuscular re-education, manual therapy, functional mobility training, patient/family education, and DME and/or AE instructions  RECOMMENDED OTHER SERVICES: n/a  CONSULTED AND AGREED WITH PLAN OF CARE: Patient  PLAN FOR NEXT SESSION: Weightbearing; review log roll technique; gross motor coordination; UBE; prone positioning   Delana Meyer, OT 12/02/2021, 9:56 AM

## 2021-12-02 ENCOUNTER — Encounter: Payer: Self-pay | Admitting: Physical Therapy

## 2021-12-06 NOTE — Progress Notes (Deleted)
Patient ID: [name], male    DOB: 01/18/1966  MRN: [id number]  CC: No chief complaint on file.   Subjective: [name] is a 56 y.o. male who presents for  His concerns today include:   requesting labs   Last appt was physical 11/22/2021 with Redmond Pulling  Patient Active Problem List   Diagnosis Date Noted   Chronic diastolic CHF (congestive heart failure) ([place]) 11/07/2021   Left ventricular hypertrophy 11/07/2021   Osteoarthritis of left glenohumeral joint 05/04/2021   Colon cancer screening    Left hemiplegia ([place]) 07/02/2020   Cognitive deficit due to recent stroke 07/02/2020   Dysphagia, post-stroke    Vascular headache    Acute blood loss anemia    AKI (acute kidney injury) ([place])    Essential hypertension    Seizures ([place])    Thalamic hemorrhage ([place]) 06/22/2020   ICH (intracerebral hemorrhage) ([place]) 06/09/2020   Sprain of anterior talofibular ligament of left ankle 01/15/2020   Hyperlipidemia, on Lipitor 04/01/2017   Alcohol use disorder, mild, abuse 06/30/2016   Morbid obesity ([place]) 06/30/2016   Nonischemic dilated cardiomyopathy ([place]) 11/08/2012   Chronic combined systolic and diastolic CHF (congestive heart failure) ([place]) 11/08/2012   HTN (hypertension) 05/25/2012     Current Outpatient Medications on File Prior to Visit  Medication Sig Dispense Refill   amLODipine (NORVASC) 10 MG tablet Take 1 tablet (10 mg total) by mouth daily. 30 tablet 2   atorvastatin (LIPITOR) 80 MG tablet Take 1 tablet (80 mg total) by mouth daily. 90 tablet 1   baclofen (LIORESAL) 10 MG tablet Take 1 tablet (10 mg total) by mouth at bedtime. 90 each 3   cloNIDine (CATAPRES) 0.1 MG tablet Take 1 tablet (0.1 mg total) by mouth 3 (three) times daily. 90 tablet 2   diclofenac Sodium (VOLTAREN) 1 % GEL Apply 2 g topically 4 (four) times daily. 200 g 0   DULoxetine (CYMBALTA) 20 MG capsule Take 1 capsule (20 mg total) by mouth daily. 90 capsule 3   folic acid (FOLVITE) 1 MG tablet Take  1 tablet (1 mg total) by mouth daily. 30 tablet 3   gabapentin (NEURONTIN) 300 MG capsule Take 1 capsule (300 mg total) by mouth 3 (three) times daily. 270 capsule 3   labetalol (NORMODYNE) 100 MG tablet Take 1 tablet by mouth twice daily 180 tablet 1   levETIRAcetam (KEPPRA) 500 MG tablet Take 1 tablet (500 mg total) by mouth 2 (two) times daily. 180 tablet 3   losartan (COZAAR) 50 MG tablet Take 1 tablet (50 mg total) by mouth 2 (two) times daily. 60 tablet 2   Multiple Vitamins-Minerals (CERTAVITE/ANTIOXIDANTS) TABS Take 1 tablet by mouth daily. 30 tablet 0   tadalafil (CIALIS) 5 MG tablet Take 5 mg by mouth every morning.     tamsulosin (FLOMAX) 0.4 MG CAPS capsule TAKE 1 CAPSULE BY MOUTH ONCE DAILY AFTER SUPPER 30 capsule 0   thiamine 100 MG tablet Take 1 tablet (100 mg total) by mouth daily. 30 tablet 0   tiZANidine (ZANAFLEX) 4 MG tablet Take 1 tablet (4 mg total) by mouth at bedtime as needed for muscle spasms. 30 tablet 0   topiramate (TOPAMAX) 50 MG tablet Take 1 tablet (50 mg total) by mouth 2 (two) times daily. 60 tablet 5   No current facility-administered medications on file prior to visit.    No Known Allergies  Social History   Socioeconomic History   Marital status: Married  Spouse name: Not on file   Number of children: Not on file   Years of education: Not on file   Highest education level: Not on file  Occupational History   Not on file  Tobacco Use   Smoking status: Former    Types: Cigarettes    Passive exposure: Past   Smokeless tobacco: Never  Vaping Use   Vaping Use: Never used  Substance and Sexual Activity   Alcohol use: Not Currently   Drug use: Not Currently    Types: Cocaine, Marijuana, Oxycodone   Sexual activity: Yes  Other Topics Concern   Not on file  Social History Narrative   Not on file   Social Determinants of Health   Financial Resource Strain: Not on file  Food Insecurity: Not on file  Transportation Needs: Not on file   Physical Activity: Not on file  Stress: Not on file  Social Connections: Not on file  Intimate Partner Violence: Not on file    Family History  Problem Relation Age of Onset   Hypertension Mother        Passed away when she is 74 secondary to hip surgery complications   CAD Mother    Heart attack Mother    Hypertension Father    Diabetes Brother    Stroke Brother    Heart attack Maternal Grandmother    Colon cancer Neg Hx    Esophageal cancer Neg Hx    Pancreatic cancer Neg Hx    Stomach cancer Neg Hx    Colon polyps Neg Hx     Past Surgical History:  Procedure Laterality Date   COLONOSCOPY WITH PROPOFOL N/A 12/09/2020   Procedure: COLONOSCOPY WITH PROPOFOL;  Surgeon: Benancio Deeds, MD;  Location: [facility];  Service: Gastroenterology;  Laterality: N/A;   KNEE ARTHROSCOPY Left 04/13/2016   Procedure: ARTHROSCOPY KNEE WITH DEBRIDEMENT;  Surgeon: Valeria Batman, MD;  Location: [facility];  Service: Orthopedics;  Laterality: Left;   KNEE ARTHROSCOPY WITH MEDIAL MENISECTOMY Right 04/13/2016   Procedure: KNEE ARTHROSCOPY WITH MEDIAL MENISECTOMY;  Surgeon: Valeria Batman, MD;  Location: [facility];  Service: Orthopedics;  Laterality: LEFT not right knee   NASAL FRACTURE SURGERY     SHOULDER SURGERY Left     ROS: Review of Systems Negative except as stated above  PHYSICAL EXAM: There were no vitals taken for this visit.  Physical Exam  {male adult master:[id number]} {male adult master:[id number]}     Latest Ref Rng & Units 11/23/2021   11:33 AM 05/11/2021    4:52 PM 09/29/2020    4:45 PM  CMP  Glucose 70 - 99 mg/dL 77  75  88   BUN 6 - 24 mg/dL 14  16  14    Creatinine 0.76 - 1.27 mg/dL  8.18  5.63   Sodium 134 - 144 mmol/L 144  144  143   Potassium 3.5 - 5.2 mmol/L 4.0  3.9  3.9   Chloride 96 - 106 mmol/L 106  111  107   CO2 20 - 29 mmol/L 24  19  20    Calcium 8.7 - 10.2 mg/dL 1.49  9.7    Total Protein 6.0 - 8.5  g/dL 7.3   7.7   Total Bilirubin 0.0 - 1.2 mg/dL 0.5   0.4   Alkaline Phos 44 - 121 IU/L 73   105   AST 0 - 40 IU/L 22   18   ALT 0 - 44  IU/L 24   22    Lipid Panel     Component Value Date/Time   CHOL 168 10/31/2021 1520   TRIG 132 10/31/2021 1520   HDL 59 10/31/2021 1520   CHOLHDL 2.8 10/31/2021 1520   CHOLHDL 3.8 06/09/2020 1809   VLDL 66 (H) 06/09/2020 1809   LDLCALC 86 10/31/2021 1520    CBC    Component Value Date/Time   WBC 4.7 11/23/2021 1133   WBC 6.0 07/19/2020 0530   RBC 4.58 11/23/2021 1133   RBC 3.61 (L) 07/19/2020 0530   HGB 13.9 11/23/2021 1133   HCT 42.0 11/23/2021 1133   PLT 314 11/23/2021 1133   MCV 92 11/23/2021 1133   MCH 30.3 11/23/2021 1133   MCH 31.6 07/19/2020 0530   MCHC 33.1 11/23/2021 1133   MCHC 33.4 07/19/2020 0530   RDW 11.2 (L) 11/23/2021 1133   LYMPHSABS 1.6 11/23/2021 1133   MONOABS 0.8 06/28/2020 1103   EOSABS 0.1 11/23/2021 1133   BASOSABS 0.0 11/23/2021 1133    ASSESSMENT AND PLAN:  There are no diagnoses linked to this encounter.   Patient was given the opportunity to ask questions.  Patient verbalized understanding of the plan and was able to repeat key elements of the plan. Patient was given clear instructions to go to Emergency Department or return to medical center if symptoms don't improve, worsen, or new problems develop.The patient verbalized understanding.   No orders of the defined types were placed in this encounter.    Requested Prescriptions    No prescriptions requested or ordered in this encounter    No follow-ups on file.  Camillia Herter, NP

## 2021-12-08 ENCOUNTER — Ambulatory Visit: Payer: BC Managed Care – PPO | Admitting: Occupational Therapy

## 2021-12-08 ENCOUNTER — Ambulatory Visit: Payer: BC Managed Care – PPO | Admitting: Physical Therapy

## 2021-12-08 NOTE — Therapy (Incomplete)
OUTPATIENT OCCUPATIONAL THERAPY NEURO EVALUATION  Patient Name: [name] MRN: [id number] DOB:15-Sep-1965, 56 y.o., male Today's Date: 12/08/2021  PCP: Rema Fendt, NP REFERRING PROVIDER: Ihor Austin, NP        Past Medical History:  Diagnosis Date   Anxiety    Cataract    removed left eye   Chronic combined systolic and diastolic CHF (congestive heart failure) (H[facility])    EF 25-30%   Diastolic dysfunction    Dyslipidemia    Hyperlipidemia    Hypertension    Neuromuscular disorder (HCC)    neuropathy left foot   Nonischemic dilated cardiomyopathy ([facility])    felt secondary to HTN with no ischemia on nuclear stress test 05/2012, EF 25-30%   Seizures ([facility])    last seizure 05-2020- on Keppra   Shortness of breath    Stroke ([facility]) 05/2020   Past Surgical History:  Procedure Laterality Date   COLONOSCOPY WITH PROPOFOL N/A 12/09/2020   Procedure: COLONOSCOPY WITH PROPOFOL;  Surgeon: Benancio Deeds, MD;  Location: [facility];  Service: Gastroenterology;  Laterality: N/A;   KNEE ARTHROSCOPY Left 04/13/2016   Procedure: ARTHROSCOPY KNEE WITH DEBRIDEMENT;  Surgeon: Valeria Batman, MD;  Location: [facility];  Service: Orthopedics;  Laterality: Left;   KNEE ARTHROSCOPY WITH MEDIAL MENISECTOMY Right 04/13/2016   Procedure: KNEE ARTHROSCOPY WITH MEDIAL MENISECTOMY;  Surgeon: Valeria Batman, MD;  Location:  SURGERY CENTER;  Service: Orthopedics;  Laterality: LEFT not right knee   NASAL FRACTURE SURGERY     SHOULDER SURGERY Left    Patient Active Problem List   Diagnosis Date Noted   Chronic diastolic CHF (congestive heart failure) (H[facility]) 11/07/2021   Left ventricular hypertrophy 11/07/2021   Osteoarthritis of left glenohumeral joint 05/04/2021   Colon cancer screening    Left hemiplegia ([facility]) 07/02/2020   Cognitive deficit due to recent stroke 07/02/2020   Dysphagia, post-stroke    Vascular headache    Acute blood loss anemia    AKI  (acute kidney injury) ([facility])    Essential hypertension    Seizures (HCC)    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   ICH (intracerebral hemorrhage) ([facility]) 06/09/2020   Sprain of anterior talofibular ligament of left ankle 01/15/2020   Hyperlipidemia, on Lipitor 04/01/2017   Alcohol use disorder, mild, abuse 06/30/2016   Morbid obesity (HCC) 06/30/2016   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   Chronic combined systolic and diastolic CHF (congestive heart failure) (H[facility]) 11/08/2012   HTN (hypertension) 05/25/2012    ONSET DATE: 06/08/20   REFERRING DIAG:  I61.9 (ICD-10-CM) - Nontraumatic thalamic hemorrhage   G81.94 (ICD-10-CM) - Left hemiparesis    THERAPY DIAG:  No diagnosis found.  Rationale for Evaluation and Treatment Rehabilitation  SUBJECTIVE:   SUBJECTIVE STATEMENT: He has not been able to utilize log rolling techniques at home and realizes he sleeps on the opposite side of the bed than what he practiced these techniques during his previous therapy sessions.   Pt accompanied by: self  PERTINENT HISTORY: Update 10/31/2021 JM: Patient returns for follow-up after prior visit 7 months ago regarding history of ICH, seizures and headaches.  He is unaccompanied.  Reports residual left sided weakness and nerve pain. Was previously just in foot, but now going up his whole leg to his hip.  At prior visit with PMR, recommended trialing different medications but he wishes to try to avoid medications if able.  He questions restarting therapies.  Continues to ambulate with a cane, denies any  recent falls.  Does mention left side of face and tongue feels swollen over the past 2 weeks, denies any change in diet or medications.  Denies any difficulty breathing or swallowing. Compliant on Keppra, no seizure activity. Has not had any recent headaches, currently on topiramate 50 mg twice daily. On atorvastatin, denies side effects.  Blood pressure well controlled, today 112/65.  Is being  seen by cardiology next week for further blood pressure management.  No further concerns at this time.  PRECAUTIONS: Fall  WEIGHT BEARING RESTRICTIONS: No  PAIN:  Are you having pain? Yes: NPRS scale: 3/10 Pain location: L shoulder Pain description: stiff Aggravating factors: cold Relieving factors: heat; stretching reporting improvement with reps  FALLS: Has patient fallen in last 6 months? Yes. Number of falls 1-he was leaving from the living room going to the kitchen, he lost his balance backwards.  PLOF: Requires assistive device for independence and Vocation/Vocational requirements: pt is on disability  PATIENT GOALS: improve use of LUE and overall independence with completion of functional activities.   OBJECTIVE:   HAND DOMINANCE: Right   MOBILITY STATUS: Independent; uses quad cane; increased time   ACTIVITY TOLERANCE: Activity tolerance: no reported change since CVA unless limited to pain  FUNCTIONAL OUTCOME MEASURES: Quick Dash: 45.45% disability with use of LUE  UPPER EXTREMITY ROM     AROM Right eval Left eval  Shoulder flexion WNL 120 with ataxia  Shoulder abduction WNL 120 with ataxia  Elbow flexion WNL WFL  Elbow extension WNL WFL  Wrist flexion WNL WFL  Wrist extension WNL N/A past neutral  Wrist pronation WNL WFL  Wrist supination WNL 45 degrees  (Blank rows = not tested)  L Digit flex and ext: [facility]; Digit opposition: BFL, very ataxic  UPPER EXTREMITY MMT:     MMT Right eval Left eval  Shoulder flexion WNL WFL within available ROM  Shoulder abduction WNL "  Elbow flexion WNL WFL  Elbow extension WNL WFL  (Blank rows = not tested)  HAND FUNCTION: Grip strength: Right: 123.8 lbs; Left: 14.7 lbs  COORDINATION: Box and Blocks:  Left 13 blocks (11/24/2021) Ataxic; unable to test fine motor coordination secondary to extent of severity  SENSATION: Light touch: WFL though reports decreased sensation in comparison to RUE  EDEMA:  n/a  MUSCLE TONE: LUE: not formally tested this visit  COGNITION: Overall cognitive status: Within functional limits for tasks assessed  VISION: Subjective report: distance vision with L eye occasionally blurry; no recent diplopia Baseline vision:  OTC readers  VISION ASSESSMENT: To be further assessed in functional context   PERCEPTION: WFL  PRAXIS: WFL  OBSERVATIONS:Less cues to control LUE gross motor coordination though does require cues to slow pace with task completion  TODAY'S TREATMENT:  - Neuro re-education completed for duration as noted below including: Pt completed arm bike in sitting for 5 minutes with average RPM of 28 for endurance, attention, increased functional use, spasticity management, and strengthening of affected extremity. Pt requiring large ace wrap over L hand to help maintain positioning. Pt alternating direction of pedaling for 2.5 minutes. Extensive cues provided throughout to maintain stability with respect to anterior/posterior trunk lean and consistent grasp maintenance.  Pt transitioned to sidelying on affected side requiring only min cueing for proper positioning to reduce ataxic movements and comfort to increase weightbearing through the affected extremity.   Pt transitioned back to EOB using log rolling technique from right sidelying as this is the side of the bed he sleeps on  at home. Therapist encouraged incorporation of LUE into completion with increased proprioceptive input as needed for improved ROM, strength, and coordination  Pt transitioned to prone on knees and elbows with increased time and max multimodal cueing due to poor motor planning as needed to improve overall mobility, strength, and safety with completion of functional activities.  Pt completed chest presses, horizontal abduction and adduction, as well as shoulder flexion while in supine using cane for improved ROM. Pt requiring less cueing for proper completion  While sitting  EOB, pt completed lateral scooting from [facility] to FOB using BUE for improved functional mobility and proprioceptive input through the affected extremity.    PATIENT EDUCATION: Education details: HEP review Person educated: Patient Education method: Chief Technology Officer Education comprehension: verbalized understanding and needs further education  HOME EXERCISE PROGRAM: 11/16/2021 - reinitiated LUE cane HEP  GOALS: Goals reviewed with patient? Yes  SHORT TERM GOALS: Target date: 12/21/2021  Patient will demonstrate LUE ROM HEP with 25% verbal cues or less for proper execution. Baseline: Re-initiated LUE cane HEP Goal status: INITIAL  2.  Pt will demonstrate ability to pick up 9 hole peg in under 50 seconds Baseline: lacks opposition to index finger Goal status: INITIAL  3.  Will assess Box and Blocks during future visit and set goal as appropriate.  Baseline: Not tested due to time constraints Goal status: INITIAL    LONG TERM GOALS: Target date:  01/25/2022  1. Patient will demonstrate LUE coordination and strengthening HEP with 25% verbal cues or less for proper execution.  Baseline: n/a Goal status: INITIAL  2.  Patient will demonstrate at least 16%  improvement with quick Dash score (reporting 29.45% disability or less) indicating improved functional use of affected extremity.  Baseline: 45.45% disability with use of LUE Goal status: INITIAL  3.  Pt will demonstrate at least 30 lbs L grip strength as needed to lift and carry items.  Baseline: 14.7 lbs Goal status: INITIAL  4.  Pt will perform home management and simple meal prep with good safety awareness and use of modifications/DME as needed.   Baseline:  Goal status: INITIAL    ASSESSMENT:  CLINICAL IMPRESSION: Pt benefits from skilled OT services following CVA for improved LUE coordination, pain, and safety with completion of functional activities. Pt's progression towards goals limited by carry-over  outside of clinic; however, pt seems now to better understand importance of HEP completion.  PERFORMANCE DEFICITS: in functional skills including ADLs, IADLs, coordination, sensation, ROM, strength, pain, Fine motor control, Gross motor control, mobility, balance, and UE functional use.  IMPAIRMENTS: are limiting patient from ADLs, IADLs, and leisure.   PLAN:  OT FREQUENCY: 1-2x/week for up to 9 sessions  OT DURATION: 10 weeks   PLANNED INTERVENTIONS: self care/ADL training, therapeutic exercise, therapeutic activity, neuromuscular re-education, manual therapy, functional mobility training, patient/family education, and DME and/or AE instructions  RECOMMENDED OTHER SERVICES: n/a  CONSULTED AND AGREED WITH PLAN OF CARE: Patient  PLAN FOR NEXT SESSION: Weightbearing; review log roll technique; gross motor coordination; UBE; prone positioning   Delana Meyer, OT 12/08/2021, 11:01 AM

## 2021-12-13 ENCOUNTER — Ambulatory Visit: Payer: BC Managed Care – PPO | Admitting: Family

## 2021-12-13 ENCOUNTER — Ambulatory Visit: Payer: BC Managed Care – PPO | Admitting: Occupational Therapy

## 2021-12-13 ENCOUNTER — Ambulatory Visit: Payer: BC Managed Care – PPO | Admitting: Physical Therapy

## 2021-12-13 ENCOUNTER — Encounter: Payer: Self-pay | Admitting: Occupational Therapy

## 2021-12-13 DIAGNOSIS — I69154 Hemiplegia and hemiparesis following nontraumatic intracerebral hemorrhage affecting left non-dominant side: Secondary | ICD-10-CM | POA: Diagnosis not present

## 2021-12-13 DIAGNOSIS — R2689 Other abnormalities of gait and mobility: Secondary | ICD-10-CM

## 2021-12-13 DIAGNOSIS — I69354 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-dominant side: Secondary | ICD-10-CM

## 2021-12-13 DIAGNOSIS — R41842 Visuospatial deficit: Secondary | ICD-10-CM

## 2021-12-13 DIAGNOSIS — R52 Pain, unspecified: Secondary | ICD-10-CM

## 2021-12-13 DIAGNOSIS — R208 Other disturbances of skin sensation: Secondary | ICD-10-CM | POA: Diagnosis not present

## 2021-12-13 DIAGNOSIS — G8929 Other chronic pain: Secondary | ICD-10-CM | POA: Diagnosis not present

## 2021-12-13 DIAGNOSIS — R278 Other lack of coordination: Secondary | ICD-10-CM

## 2021-12-13 DIAGNOSIS — R2681 Unsteadiness on feet: Secondary | ICD-10-CM | POA: Diagnosis not present

## 2021-12-13 DIAGNOSIS — M25512 Pain in left shoulder: Secondary | ICD-10-CM | POA: Diagnosis not present

## 2021-12-13 DIAGNOSIS — M6281 Muscle weakness (generalized): Secondary | ICD-10-CM

## 2021-12-13 NOTE — Patient Instructions (Addendum)
Log rolling - to get in and out of bed While on your back, do your cane exercises (keep wrist and elbow straight, keep fingers around cane) Stress ball Finger exercises Prayer stretches Table push-ups *Keep your own body weight through your left arm*    Lie on back holding wand. Raise arms over head. Hold 5sec. Repeat 10 times per set.  Do 2-3 sessions per day.    Press-Up With Wand   Press wand up until elbows are straight, then reach wand over head to a pain free range. Hold 5 seconds. Repeat 10 times. Do 2-3 sessions per day.     Active Assistive Shoulder External Rotation - on back bring arms side to side

## 2021-12-13 NOTE — Therapy (Signed)
OUTPATIENT OCCUPATIONAL THERAPY NEURO EVALUATION  Patient Name: [name] MRN: [id number] DOB:18-Oct-1965, 56 y.o., male Today's Date: 12/13/2021  PCP: Camillia Herter, NP REFERRING PROVIDER: Frann Rider, NP   OT End of Session - 12/13/21 1236     Visit Number 5    Number of Visits 9    Date for OT Re-Evaluation 01/25/21    Authorization Type BCBS    Authorization Time Period VL: PT/OT combined 30 (9 visits remaining with eval); pt has previously used 14 visits;    Authorization - Number of Visits 9    OT Start Time [id number]    OT Stop Time 1314    OT Time Calculation (min) 40 min    Activity Tolerance Patient tolerated treatment well    Behavior During Therapy WFL for tasks assessed/performed             Past Medical History:  Diagnosis Date   Anxiety    Cataract    removed left eye   Chronic combined systolic and diastolic CHF (congestive heart failure) (HCC)    EF [id number]   Diastolic dysfunction    Dyslipidemia    Hyperlipidemia    Hypertension    Neuromuscular disorder (HCC)    neuropathy left foot   Nonischemic dilated cardiomyopathy ([place])    felt secondary to HTN with no ischemia on nuclear stress test 05/2012, EF 25-30%   Seizures ([place])    last seizure 05-2020- on Keppra   Shortness of breath    Stroke ([place]) 05/2020   Past Surgical History:  Procedure Laterality Date   COLONOSCOPY WITH PROPOFOL N/A 12/09/2020   Procedure: COLONOSCOPY WITH PROPOFOL;  Surgeon: Yetta Flock, MD;  Location: [facility];  Service: Gastroenterology;  Laterality: N/A;   KNEE ARTHROSCOPY Left 04/13/2016   Procedure: ARTHROSCOPY KNEE WITH DEBRIDEMENT;  Surgeon: Garald Balding, MD;  Location: [facility];  Service: Orthopedics;  Laterality: Left;   KNEE ARTHROSCOPY WITH MEDIAL MENISECTOMY Right 04/13/2016   Procedure: KNEE ARTHROSCOPY WITH MEDIAL MENISECTOMY;  Surgeon: Garald Balding, MD;  Location: [facility];  Service: Orthopedics;   Laterality: LEFT not right knee   NASAL FRACTURE SURGERY     SHOULDER SURGERY Left    Patient Active Problem List   Diagnosis Date Noted   Chronic diastolic CHF (congestive heart failure) ([place]) 11/07/2021   Left ventricular hypertrophy 11/07/2021   Osteoarthritis of left glenohumeral joint 05/04/2021   Colon cancer screening    Left hemiplegia ([place]) 07/02/2020   Cognitive deficit due to recent stroke 07/02/2020   Dysphagia, post-stroke    Vascular headache    Acute blood loss anemia    AKI (acute kidney injury) ([place])    Essential hypertension    Seizures (HCC)    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   ICH (intracerebral hemorrhage) ([place]) 06/09/2020   Sprain of anterior talofibular ligament of left ankle 01/15/2020   Hyperlipidemia, on Lipitor 04/01/2017   Alcohol use disorder, mild, abuse 06/30/2016   Morbid obesity ([place]) 06/30/2016   Nonischemic dilated cardiomyopathy ([place]) 11/08/2012   Chronic combined systolic and diastolic CHF (congestive heart failure) ([place]) 11/08/2012   HTN (hypertension) 05/25/2012    ONSET DATE: 06/08/20   REFERRING DIAG:  I61.9 (ICD-10-CM) - Nontraumatic thalamic hemorrhage   G81.94 (ICD-10-CM) - Left hemiparesis    THERAPY DIAG:  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-dominant side (HCC)  Other abnormalities of gait and mobility  Muscle weakness (generalized)  Other disturbances of skin  sensation  Hemiplegia and hemiparesis following nontraumatic intracerebral hemorrhage affecting left non-dominant side (HCC)  Chronic left shoulder pain  Visuospatial deficit  Pain  Other lack of coordination  Unsteadiness on feet  Rationale for Evaluation and Treatment Rehabilitation  SUBJECTIVE:   SUBJECTIVE STATEMENT: He continues to have challenges at home, which was why he did not come to his appointment last week though he also experienced a fall.   Pt accompanied by: self  PERTINENT HISTORY: Update  10/31/2021 JM: Patient returns for follow-up after prior visit 7 months ago regarding history of ICH, seizures and headaches.  He is unaccompanied.  Reports residual left sided weakness and nerve pain. Was previously just in foot, but now going up his whole leg to his hip.  At prior visit with PMR, recommended trialing different medications but he wishes to try to avoid medications if able.  He questions restarting therapies.  Continues to ambulate with a cane, denies any recent falls.  Does mention left side of face and tongue feels swollen over the past 2 weeks, denies any change in diet or medications.  Denies any difficulty breathing or swallowing. Compliant on Keppra, no seizure activity. Has not had any recent headaches, currently on topiramate 50 mg twice daily. On atorvastatin, denies side effects.  Blood pressure well controlled, today 112/65.  Is being seen by cardiology next week for further blood pressure management.  No further concerns at this time.  PRECAUTIONS: Fall  WEIGHT BEARING RESTRICTIONS: No  PAIN:  Are you having pain? Yes: NPRS scale: 3/10 Pain location: L shoulder Pain description: stiff Aggravating factors: cold Relieving factors: heat; stretching reporting improvement with reps  FALLS: Has patient fallen in last 6 months? Yes. Number of falls 2-he fell last week after taking his medications, which made him feel more drowsy.  PLOF: Requires assistive device for independence and Vocation/Vocational requirements: pt is on disability  PATIENT GOALS: improve use of LUE and overall independence with completion of functional activities.   OBJECTIVE:   HAND DOMINANCE: Right   MOBILITY STATUS: Independent; uses quad cane; increased time   ACTIVITY TOLERANCE: Activity tolerance: no reported change since CVA unless limited to pain  FUNCTIONAL OUTCOME MEASURES: Quick Dash: 45.45% disability with use of LUE  UPPER EXTREMITY ROM     AROM Right eval Left eval   Shoulder flexion WNL 120 with ataxia  Shoulder abduction WNL 120 with ataxia  Elbow flexion WNL WFL  Elbow extension WNL WFL  Wrist flexion WNL WFL  Wrist extension WNL N/A past neutral  Wrist pronation WNL WFL  Wrist supination WNL 45 degrees  (Blank rows = not tested)  L Digit flex and ext: [facility]; Digit opposition: BFL, very ataxic  UPPER EXTREMITY MMT:     MMT Right eval Left eval  Shoulder flexion WNL WFL within available ROM  Shoulder abduction WNL "  Elbow flexion WNL WFL  Elbow extension WNL WFL  (Blank rows = not tested)  HAND FUNCTION: Grip strength: Right: 123.8 lbs; Left: 14.7 lbs  COORDINATION: Box and Blocks:  Left 13 blocks (11/24/2021) Ataxic; unable to test fine motor coordination secondary to extent of severity  SENSATION: Light touch: WFL though reports decreased sensation in comparison to RUE  EDEMA: n/a  MUSCLE TONE: LUE: not formally tested this visit  COGNITION: Overall cognitive status: Within functional limits for tasks assessed  VISION: Subjective report: distance vision with L eye occasionally blurry; no recent diplopia Baseline vision:  OTC readers  VISION ASSESSMENT: To be further assessed  in functional context   PERCEPTION: WFL  PRAXIS: WFL  OBSERVATIONS: More rigidity and ataxia in L side of body compared to previous visits. Slight improvement with tasks as completed below.   TODAY'S TREATMENT:  - Neuro re-education completed for duration as noted below including: Pt transitioned to sidelying on affected side requiring only min cueing for proper positioning to reduce ataxic movements and comfort to increase weightbearing through the affected extremity.   Pt transitioned back to EOB using log rolling technique from right sidelying as this is the side of the bed he sleeps on at home. Therapist encouraged incorporation of LUE into completion with increased proprioceptive input as needed for improved ROM, strength, and  coordination  Pt completed log rolling x 4 to promote carry-over and reduce ataxia and rigidity while promoting functional use of LUE and overall independence and safety with completion of functional tasks  Pt completed chest presses, horizontal abduction and adduction, as well as shoulder flexion while in supine using cane for improved ROM. Pt requiring less cueing for proper completion  While sitting EOB, pt completed lateral scooting from [facility] to FOB using BUE for improved functional mobility and proprioceptive input through the affected extremity.   Therapist reviewed HEP as noted in pt instructions for improved carryover and management of ataxia and rigidity at home.   PATIENT EDUCATION: Education details: HEP review Person educated: Patient Education method: [employer] Education comprehension: verbalized understanding and needs further education  HOME EXERCISE PROGRAM: 11/16/2021 - reinitiated LUE cane HEP  GOALS: Goals reviewed with patient? Yes  SHORT TERM GOALS: Target date: 12/21/2021  Patient will demonstrate LUE ROM HEP with 25% verbal cues or less for proper execution. Baseline: Re-initiated LUE cane HEP Goal status: INITIAL  2.  Pt will demonstrate ability to pick up 9 hole peg in under 50 seconds Baseline: lacks opposition to index finger Goal status: INITIAL  3.  Will assess Box and Blocks during future visit and set goal as appropriate.  Baseline: Not tested due to time constraints Goal status: INITIAL    LONG TERM GOALS: Target date:  01/25/2022  1. Patient will demonstrate LUE coordination and strengthening HEP with 25% verbal cues or less for proper execution.  Baseline: n/a Goal status: INITIAL  2.  Patient will demonstrate at least 16%  improvement with quick Dash score (reporting 29.45% disability or less) indicating improved functional use of affected extremity.  Baseline: 45.45% disability with use of LUE Goal status: INITIAL  3.   Pt will demonstrate at least 30 lbs L grip strength as needed to lift and carry items.  Baseline: 14.7 lbs Goal status: INITIAL  4.  Pt will perform home management and simple meal prep with good safety awareness and use of modifications/DME as needed.   Baseline:  Goal status: INITIAL    ASSESSMENT:  CLINICAL IMPRESSION: Pt benefits from skilled OT services following CVA for improved LUE coordination, pain, and safety with completion of functional activities. Pt's progression towards goals limited by carry-over outside of clinic, which is not helped by his home environment.  PERFORMANCE DEFICITS: in functional skills including ADLs, IADLs, coordination, sensation, ROM, strength, pain, Fine motor control, Gross motor control, mobility, balance, and UE functional use.  IMPAIRMENTS: are limiting patient from ADLs, IADLs, and leisure.   PLAN:  OT FREQUENCY: 1-2x/week for up to 9 sessions  OT DURATION: 10 weeks   PLANNED INTERVENTIONS: self care/ADL training, therapeutic exercise, therapeutic activity, neuromuscular re-education, manual therapy, functional mobility training, patient/family education,  and DME and/or AE instructions  RECOMMENDED OTHER SERVICES: n/a  CONSULTED AND AGREED WITH PLAN OF CARE: Patient  PLAN FOR NEXT SESSION: Weightbearing; gross motor coordination; UBE; prone positioning   Dennis Bast, OT 12/13/2021, 2:28 PM

## 2021-12-13 NOTE — Therapy (Signed)
OUTPATIENT PHYSICAL THERAPY NEURO TREATMENT NOTE   Patient Name: [name] MRN: [id number] DOB:06/21/1965, 56 y.o., male Today's Date: 12/14/2021   PCP: Rema Fendt, NP REFERRING PROVIDER: Ihor Austin, NP    PT End of Session - 12/14/21 1905     Visit Number 5    Number of Visits 9    Date for PT Re-Evaluation 01/13/22    Authorization Type BCBS 30 visits PT/OT combined - If seen for PT & OT on the same it constitutes one visit, notes say 8 used, pt states 6 remaining, when counted from start of calendar year 16 left.    PT Start Time 1317    PT Stop Time 1400    PT Time Calculation (min) 43 min    Equipment Utilized During Treatment Gait belt   spc with quad tip   Activity Tolerance Patient tolerated treatment well    Behavior During Therapy WFL for tasks assessed/performed                   Past Medical History:  Diagnosis Date   Anxiety    Cataract    removed left eye   Chronic combined systolic and diastolic CHF (congestive heart failure) (HCC)    EF 25-30%   Diastolic dysfunction    Dyslipidemia    Hyperlipidemia    Hypertension    Neuromuscular disorder (HCC)    neuropathy left foot   Nonischemic dilated cardiomyopathy (HCC)    felt secondary to HTN with no ischemia on nuclear stress test 05/2012, EF 25-30%   Seizures (HCC)    last seizure 05-2020- on Keppra   Shortness of breath    Stroke (HCC) 05/2020   Past Surgical History:  Procedure Laterality Date   COLONOSCOPY WITH PROPOFOL N/A 12/09/2020   Procedure: COLONOSCOPY WITH PROPOFOL;  Surgeon: Benancio Deeds, MD;  [facility];  Service: Gastroenterology;  Laterality: N/A;   KNEE ARTHROSCOPY Left 04/13/2016   Procedure: ARTHROSCOPY KNEE WITH DEBRIDEMENT;  Surgeon: Valeria Batman, MD;  Location: [facility];  Service: Orthopedics;  Laterality: Left;   KNEE ARTHROSCOPY WITH MEDIAL MENISECTOMY Right 04/13/2016   Procedure: KNEE ARTHROSCOPY WITH MEDIAL  MENISECTOMY;  Surgeon: Valeria Batman, MD;  Location: [facility];  Service: Orthopedics;  Laterality: LEFT not right knee   NASAL FRACTURE SURGERY     SHOULDER SURGERY Left    Patient Active Problem List   Diagnosis Date Noted   Chronic diastolic CHF (congestive heart failure) (HCC) 11/07/2021   Left ventricular hypertrophy 11/07/2021   Osteoarthritis of left glenohumeral joint 05/04/2021   Colon cancer screening    Left hemiplegia (HCC) 07/02/2020   Cognitive deficit due to recent stroke 07/02/2020   Dysphagia, post-stroke    Vascular headache    Acute blood loss anemia    AKI (acute kidney injury) (HCC)    Essential hypertension    Seizures (HCC)    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   ICH (intracerebral hemorrhage) (HCC) 06/09/2020   Sprain of anterior talofibular ligament of left ankle 01/15/2020   Hyperlipidemia, on Lipitor 04/01/2017   Alcohol use disorder, mild, abuse 06/30/2016   Morbid obesity (HCC) 06/30/2016   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   Chronic combined systolic and diastolic CHF (congestive heart failure) (HCC) 11/08/2012   HTN (hypertension) 05/25/2012    ONSET DATE: 10/31/2021   REFERRING DIAG: I61.9 (ICD-10-CM) - Nontraumatic thalamic hemorrhage (HCC) G81.94 (ICD-10-CM) - Left hemiparesis (HCC)   THERAPY DIAG:  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-dominant side (HCC)  Other abnormalities of gait and mobility  Unsteadiness on feet  Rationale for Evaluation and Treatment Rehabilitation  SUBJECTIVE:                                                                                                                                                                                              SUBJECTIVE STATEMENT: Pt reports he missed therapy appts last Thursday due to not feeling well - states he felt very groggy and sluggish  Pt accompanied by: self  PERTINENT HISTORY: HTN, Non  ischemic dilated cardiomyopathy, chronic combined CHF, ETOH, hyperlipidemia, ICH, seizures, CVA 06/09/2020  PAIN:  Are you having pain? No-wouldn't consider neuropathy painful  PRECAUTIONS: Fall  WEIGHT BEARING RESTRICTIONS No  FALLS: Has patient fallen in last 6 months? Yes. Number of falls 1-he was leaving from the living room going to the kitchen, he lost his balance backwards.  LIVING ENVIRONMENT: Lives with: lives with their spouse-Wife's Albertina Parr Lives in: House/apartment Stairs: Yes: External: 4 steps; on left going up Has following equipment at home: Quad cane small base, Hemi walker, Wheelchair (manual), and Shower bench  PLOF: Requires assistive device for independence and Vocation/Vocational requirements: pt is on disability  PATIENT GOALS "To be able to walk without the cane."  OBJECTIVE:    TherEx: Lt hamstring stretch - runner's stretch 30 sec hold x1 rep with Rt foot on 2nd step  Lt gastroc stretch - used edge of bottom step - 20 sec hold x 1 rep  Heel raises bil. LE's 10 reps  Tap ups to 1st step with RLE for LLE SLS and strengthening in closed chain - 5 reps;  tap ups to 2nd step 10 reps with RLE:  tap ups to 3rd step with RLE 5 reps with moderate difficulty lifting LLE high enough to tap this step   Step ups to 6" step with LLE 10 reps - pt used Rt hand rail  Squats 5 reps inside // bars with RUE support  GAIT: Gait pattern: decreased hip/knee flexion- Left, decreased ankle dorsiflexion- Left, decreased trunk rotation, wide BOS, abducted- Left, and poor foot clearance- Left Distance walked: 115' x 1 rep with SPC:  did not attempt gait training without device in today's session due to increased ataxia with decreased balance:  Device used:   SPC with rubber quad base  Level of assistance: SBA and CGA  Step trained - 4 steps; with Rt hand rail with SBA; step over step sequence for ascension and step by step sequence for descension  NeuroRe-ed:   Sit  to stand with Rt foot on balance bubble 5 reps with min. UE support  Touching balance bubbles for targets with RUE support with CGA Step down exercise from 4" step placed inside // bars with RUE support Touched "4" on the step for target - each foot 5 reps with RUE support  PATIENT EDUCATION: Education details:  gave pt sheet with hamstring and heel cord stretches -  Person educated: Patient Education method: Explanation, demonstration, handout Education comprehension: verbalized understanding and needs further education   HOME EXERCISE PROGRAM: Pt requesting review of prior:  [id number]    GOALS: Goals reviewed with patient? Yes  SHORT TERM GOALS: Target date: 12/16/2021  Pt will be independent with strength and balance HEP to promote maintenance of gains outside the clinic.  Baseline:  To be reviewed and modified. Goal status: INITIAL  2.  Pt will demonstrate TUG of </=19 seconds in order to decrease risk of falls and improve functional mobility using LRAD. Baseline: 23.44 sec w/ SBQC (10/18) Goal status: INITIAL  3.  Pt will demonstrate a gait speed of >/=1.84 feet/sec using LRAD in order to decrease risk for falls. Baseline: 1.54 ft/sec SBQC Goal status: INITIAL  4.  BERG to be assessed w/ LTG set as appropriate. Baseline: To be assessed. Goal status: INITIAL  5.  Pt will ambulate >/=345' w/ SBQC at no more than SBA level over level, indoor surfaces to improve safety with household and limited community ambulation. Baseline: various clinic distances SBA/CGA w/ SBQC Goal status: INITIAL  LONG TERM GOALS: Target date: 01/13/2022  Pt to be independent with advance strength and balance HEP. Baseline: To be modified. Goal status: INITIAL  2.  Pt will demonstrate TUG of </=15 seconds in order to decrease risk of falls and improve functional mobility using LRAD. Baseline: 23.44 sec w/ SBQC Goal status: INITIAL  3.  Pt will demonstrate a gait speed of >/=2.14 feet/sec  using LRAD in order to decrease risk for falls. Baseline: 1.54 ft/sec SBQC Goal status: INITIAL  4.  BERG to be assessed w/ goal set as appropriate. Baseline: To be assessed. Goal status: INITIAL  5.   Pt will ambulate >/= 460' w/ LRAD ModI over level, indoor surfaces to improve safety with household and limited community ambulation. Baseline: various clinic distances SBA/CGA w/ SBQC Goal status: INITIAL  ASSESSMENT:  CLINICAL IMPRESSION: PT session focused on LLE strengthening and balance/coordination exercises; pt presented with increased ataxia today and increased tone noted in LUE.  Gait without device was not attempted due to increased unsteadiness/ataxia in LUE and LLE.  Pt reported Lt leg felt better at end of session after performing exercises and stretching.  Cont with POC.   OBJECTIVE IMPAIRMENTS Abnormal gait, decreased activity tolerance, decreased balance, decreased coordination, decreased endurance, decreased mobility, difficulty walking, decreased strength, decreased safety awareness, increased edema, impaired sensation, impaired tone, improper body mechanics, and postural dysfunction.   ACTIVITY LIMITATIONS carrying, lifting, stairs, transfers, and locomotion level  PARTICIPATION LIMITATIONS: community activity and occupation  PERSONAL FACTORS Age, Fitness, Past/current experiences, Time since onset of injury/illness/exacerbation, and 1-2 comorbidities: HTN, chronic CHF/cardiomyopathy  are also affecting patient's functional outcome.   REHAB POTENTIAL: Fair See personal factors  CLINICAL DECISION MAKING: Stable/uncomplicated  EVALUATION COMPLEXITY: Low  PLAN: PT FREQUENCY: 1x/week  PT DURATION: 8 weeks  PLANNED INTERVENTIONS: Therapeutic exercises, Therapeutic activity, Neuromuscular re-education, Balance training, Gait training, Patient/Family education, Self Care, Joint mobilization, Stair training, Vestibular training, Orthotic/Fit training, DME instructions,  Electrical stimulation, Manual therapy, and Re-evaluation  PLAN FOR NEXT SESSION: check STG's next session: cont gait with rubber quad base; gait with no device   Myha Arizpe, Donavan Burnet, PT 12/14/2021, 7:07 PM

## 2021-12-14 ENCOUNTER — Encounter: Payer: Self-pay | Admitting: Physical Therapy

## 2021-12-18 ENCOUNTER — Other Ambulatory Visit: Payer: Self-pay | Admitting: Family

## 2021-12-18 DIAGNOSIS — I10 Essential (primary) hypertension: Secondary | ICD-10-CM

## 2021-12-19 NOTE — Progress Notes (Deleted)
Patient ID: [name], male    DOB: May 02, 1965  MRN: [id number]  CC: No Dalton complaint on file.   Subjective: [name] is a 56 y.o. male who presents for  His concerns today include:   requesting labs (which ones?)  Last appt was physical 11/22/2021 with [name]   Cards HTN   Patient Active Problem List   Diagnosis Date Noted   Chronic diastolic CHF (congestive heart failure) (HCC) 11/07/2021   Left ventricular hypertrophy 11/07/2021   Osteoarthritis of left glenohumeral joint 05/04/2021   Colon cancer screening    Left hemiplegia (HCC) 07/02/2020   Cognitive deficit due to recent stroke 07/02/2020   Dysphagia, post-stroke    Vascular headache    Acute blood loss anemia    AKI (acute kidney injury) (HCC)    Essential hypertension    Seizures (HCC)    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 30 tablet 2   atorvastatin (LIPITOR) 80 MG tablet Take 1 tablet (80 mg total) by mouth daily. 90 tablet 1   baclofen (LIORESAL) 10 MG tablet Take 1 tablet (10 mg total) by mouth at bedtime. 90 each 3   cloNIDine (CATAPRES) 0.1 MG tablet Take 1 tablet (0.1 mg total) by mouth 3 (three) times daily. 90 tablet 2   diclofenac Sodium (VOLTAREN) 1 % GEL Apply 2 g topically 4 (four) times daily. 200 g 0   DULoxetine (CYMBALTA) 20 MG capsule Take 1 capsule (20 mg total) by mouth daily. 90 capsule 3   folic  acid (FOLVITE) 1 MG tablet Take 1 tablet (1 mg total) by mouth daily. 30 tablet 3   gabapentin (NEURONTIN) 300 MG capsule Take 1 capsule (300 mg total) by mouth 3 (three) times daily. 270 capsule 3   labetalol (NORMODYNE) 100 MG tablet Take 1 tablet by mouth twice daily 180 tablet 1   levETIRAcetam (KEPPRA) 500 MG tablet Take 1 tablet (500 mg total) by mouth 2 (two) times daily. 180 tablet 3   losartan (COZAAR) 50 MG tablet Take 1 tablet (50 mg total) by mouth 2 (two) times daily. 60 tablet 2   Multiple Vitamins-Minerals (CERTAVITE/ANTIOXIDANTS) TABS Take 1 tablet by mouth daily. 30 tablet 0   tadalafil (CIALIS) 5 MG tablet Take 5 mg by mouth every morning.     tamsulosin (FLOMAX) 0.4 MG CAPS capsule TAKE 1 CAPSULE BY MOUTH ONCE DAILY AFTER SUPPER 30 capsule 0   thiamine 100 MG tablet Take 1 tablet (100 mg total) by mouth daily. 30 tablet 0   tiZANidine (ZANAFLEX) 4 MG tablet Take 1 tablet (4 mg total) by mouth at bedtime as needed for muscle spasms. 30 tablet 0   topiramate (TOPAMAX) 50 MG tablet Take 1 tablet (50 mg total) by mouth 2 (two) times daily. 60 tablet 5   No current facility-administered medications on file prior to visit.    No Known Allergies  Social History   Socioeconomic History  Marital status: Married    Spouse name: Not on file   Number of children: Not on file   Years of education: Not on file   Highest education level: Not on file  Occupational History   Not on file  Tobacco Use   Smoking status: Former    Types: Cigarettes    Passive exposure: Past   Smokeless tobacco: Never  Vaping Use   Vaping Use: Never used  Substance and Sexual Activity   Alcohol use: Not Currently   Drug use: Not Currently    Types: Cocaine, Marijuana, Oxycodone   Sexual activity: Yes  Other Topics Concern   Not on file  Social History Narrative   Not on file   Social Determinants of Health   Financial Resource Strain: Not on file  Food Insecurity: Not on file   Transportation Needs: Not on file  Physical Activity: Not on file  Stress: Not on file  Social Connections: Not on file  Intimate Partner Violence: Not on file    Family History  Problem Relation Age of Onset   Hypertension Mother        Passed away when she is 51 secondary to hip surgery complications   CAD Mother    Heart attack Mother    Hypertension Father    Diabetes Brother    Stroke Brother    Heart attack Maternal Grandmother    Colon cancer Neg Hx    Esophageal cancer Neg Hx    Pancreatic cancer Neg Hx    Stomach cancer Neg Hx    Colon polyps Neg Hx     Past Surgical History:  Procedure Laterality Date   COLONOSCOPY WITH PROPOFOL N/A 12/09/2020   Procedure: COLONOSCOPY WITH PROPOFOL;  Surgeon: Benancio Deeds, MD;  Location: [facility];  Service: Gastroenterology;  Laterality: N/A;   KNEE ARTHROSCOPY Left 04/13/2016   Procedure: ARTHROSCOPY KNEE WITH DEBRIDEMENT;  Surgeon: Valeria Batman, MD;  Location: [facility];  Service: Orthopedics;  Laterality: Left;   KNEE ARTHROSCOPY WITH MEDIAL MENISECTOMY Right 04/13/2016   Procedure: KNEE ARTHROSCOPY WITH MEDIAL MENISECTOMY;  Surgeon: Valeria Batman, MD;  Location: [facility];  Service: Orthopedics;  Laterality: LEFT not right knee   NASAL FRACTURE SURGERY     SHOULDER SURGERY Left     ROS: Review of Systems Negative except as stated above  PHYSICAL EXAM: There were no vitals taken for this visit.  Physical Exam  {male adult master:[id number]} {male adult master:[id number]}     Latest Ref Rng & Units 11/23/2021   11:33 AM 05/11/2021    4:52 PM 09/29/2020    4:45 PM  CMP  Glucose 70 - 99 mg/dL 77  75  88   BUN 6 - 24 mg/dL 14  16  14    Creatinine 0.76 - 1.27 mg/dL  1.91  4.78   Sodium 134 - 144 mmol/L 144  144  143   Potassium 3.5 - 5.2 mmol/L 4.0  3.9  3.9   Chloride 96 - 106 mmol/L 106  111  107   CO2 20 - 29 mmol/L 24  19  20    Calcium 8.7 - 10.2 mg/dL 2.95  9.7     Total Protein 6.0 - 8.5 g/dL 7.3   7.7   Total Bilirubin 0.0 - 1.2 mg/dL 0.5   0.4   Alkaline Phos 44 - 121 IU/L 73   105   AST 0 - 40 IU/L 22   18  ALT 0 - 44 IU/L 24   22    Lipid Panel     Component Value Date/Time   CHOL 168 10/31/2021 1520   TRIG 132 10/31/2021 1520   HDL 59 10/31/2021 1520   CHOLHDL 2.8 10/31/2021 1520   CHOLHDL 3.8 06/09/2020 1809   VLDL 66 (H) 06/09/2020 1809   LDLCALC 86 10/31/2021 1520    CBC    Component Value Date/Time   WBC 4.7 11/23/2021 1133   WBC 6.0 07/19/2020 0530   RBC 4.58 11/23/2021 1133   RBC 3.61 (L) 07/19/2020 0530   HGB 13.9 11/23/2021 1133   HCT 42.0 11/23/2021 1133   PLT 314 11/23/2021 1133   MCV 92 11/23/2021 1133   MCH 30.3 11/23/2021 1133   MCH 31.6 07/19/2020 0530   MCHC 33.1 11/23/2021 1133   MCHC 33.4 07/19/2020 0530   RDW 11.2 (L) 11/23/2021 1133   LYMPHSABS 1.6 11/23/2021 1133   MONOABS 0.8 06/28/2020 1103   EOSABS 0.1 11/23/2021 1133   BASOSABS 0.0 11/23/2021 1133    ASSESSMENT AND PLAN:  There are no diagnoses linked to this encounter.   Patient was given the opportunity to ask questions.  Patient verbalized understanding of the plan and was able to repeat key elements of the plan. Patient was given clear instructions to go to Emergency Department or return to medical center if symptoms don't improve, worsen, or new problems develop.The patient verbalized understanding.   No orders of the defined types were placed in this encounter.    Requested Prescriptions    No prescriptions requested or ordered in this encounter    No follow-ups on file.  Rema Fendt, NP

## 2021-12-21 ENCOUNTER — Ambulatory Visit: Payer: BC Managed Care – PPO | Admitting: Family

## 2021-12-22 ENCOUNTER — Ambulatory Visit: Payer: BC Managed Care – PPO | Admitting: Occupational Therapy

## 2021-12-22 ENCOUNTER — Ambulatory Visit: Payer: BC Managed Care – PPO | Admitting: Physical Therapy

## 2021-12-27 DIAGNOSIS — I610 Nontraumatic intracerebral hemorrhage in hemisphere, subcortical: Secondary | ICD-10-CM | POA: Diagnosis not present

## 2021-12-27 DIAGNOSIS — I5042 Chronic combined systolic (congestive) and diastolic (congestive) heart failure: Secondary | ICD-10-CM | POA: Diagnosis not present

## 2021-12-27 DIAGNOSIS — R569 Unspecified convulsions: Secondary | ICD-10-CM | POA: Diagnosis not present

## 2021-12-29 ENCOUNTER — Ambulatory Visit: Payer: BC Managed Care – PPO | Attending: Family | Admitting: Physical Therapy

## 2021-12-29 ENCOUNTER — Encounter: Payer: Self-pay | Admitting: Occupational Therapy

## 2021-12-29 ENCOUNTER — Ambulatory Visit: Payer: BC Managed Care – PPO | Admitting: Occupational Therapy

## 2021-12-29 DIAGNOSIS — R278 Other lack of coordination: Secondary | ICD-10-CM | POA: Insufficient documentation

## 2021-12-29 DIAGNOSIS — I69354 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-dominant side: Secondary | ICD-10-CM | POA: Insufficient documentation

## 2021-12-29 DIAGNOSIS — R2689 Other abnormalities of gait and mobility: Secondary | ICD-10-CM

## 2021-12-29 DIAGNOSIS — I69154 Hemiplegia and hemiparesis following nontraumatic intracerebral hemorrhage affecting left non-dominant side: Secondary | ICD-10-CM | POA: Insufficient documentation

## 2021-12-29 DIAGNOSIS — G8929 Other chronic pain: Secondary | ICD-10-CM

## 2021-12-29 DIAGNOSIS — M6281 Muscle weakness (generalized): Secondary | ICD-10-CM | POA: Insufficient documentation

## 2021-12-29 DIAGNOSIS — M25512 Pain in left shoulder: Secondary | ICD-10-CM | POA: Insufficient documentation

## 2021-12-30 NOTE — Therapy (Signed)
Patient Name: [name] MRN: [id number] DOB:January 20, 1966, 56 y.o., male Today's Date: 12/29/2021  Pt arrived to therapy clinic after scheduled time for his PT visit, which was scheduled before his OT clinic. Pt stated external stressors have gotten worse and he is not in the right mindset to complete therapy today. He did state he is trying to stay up during the night and sleep during the day, which has been very hard on his body, but he feels he has to do this given his current living situation. He has gotten caffeine pills to take to help him feel more alert but has not yet taken them. OT cautioned pt on taking medications without consulting his PCP first. Pt was encouraged to seek assistance immediately with respect to his living situation due to the physical and emotional stressors he is reporting. Pt verbalized understanding of all education provided. He requests to keep his visits next week but will call if he needs to cancel.    Delana Meyer, OT 12/29/2021, 9:11 AM

## 2021-12-30 NOTE — Therapy (Deleted)
Pt arrived to therapy clinic after scheduled time for his PT visit, which was scheduled before his OT clinic. Pt stated external stressors have gotten worse and he is not in the right mindset to complete therapy today. He did state he is trying to stay up during the night and sleep during the day, which has been very hard on his body, but he feels he has to do this given his current living situation. He has gotten caffeine pills to take to help him feel more alert but has not yet taken them. OT cautioned pt on taking medications without consulting his PCP first. Pt was encouraged to seek assistance immediately with respect to his living situation due to the physical and emotional stressors he is reporting. Pt verbalized understanding of all education provided. He requests to keep his visits next week but will call if he needs to cancel.

## 2021-12-30 NOTE — Therapy (Deleted)
Pt arrived to therapy clinic after scheduled time for his PT visit, which was scheduled before his OT clinic. Pt stated external stressors have gotten worse and he is not in the right mindset to complete therapy today. He did state he is trying to stay up during the night and sleep during the day, which has been very hard on his body, but he feels he has to do this given his current living situation. He has gotten caffeine pills to take to help him feel more alert but has not yet taken them. OT cautioned pt on taking medications without consulting his PCP first. Pt was encouraged to seek assistance immediately with respect to his living situation due to the physical and emotional stressors he is reporting. Pt verbalized understanding of all education provided. He requests to keep his visits next week but will call if he needs to cancel.  

## 2022-01-05 ENCOUNTER — Ambulatory Visit: Payer: BC Managed Care – PPO | Admitting: Occupational Therapy

## 2022-01-05 ENCOUNTER — Ambulatory Visit: Payer: BC Managed Care – PPO | Admitting: Physical Therapy

## 2022-01-05 DIAGNOSIS — M6281 Muscle weakness (generalized): Secondary | ICD-10-CM | POA: Diagnosis not present

## 2022-01-05 DIAGNOSIS — I69354 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-dominant side: Secondary | ICD-10-CM | POA: Diagnosis not present

## 2022-01-05 DIAGNOSIS — R278 Other lack of coordination: Secondary | ICD-10-CM

## 2022-01-05 DIAGNOSIS — I69154 Hemiplegia and hemiparesis following nontraumatic intracerebral hemorrhage affecting left non-dominant side: Secondary | ICD-10-CM | POA: Diagnosis not present

## 2022-01-05 DIAGNOSIS — R2689 Other abnormalities of gait and mobility: Secondary | ICD-10-CM | POA: Diagnosis not present

## 2022-01-05 DIAGNOSIS — G8929 Other chronic pain: Secondary | ICD-10-CM | POA: Diagnosis not present

## 2022-01-05 DIAGNOSIS — M25512 Pain in left shoulder: Secondary | ICD-10-CM | POA: Diagnosis not present

## 2022-01-05 NOTE — Therapy (Signed)
OUTPATIENT OCCUPATIONAL THERAPY NEURO DISCHARGE  Patient Name: [name] MRN: [id number] DOB:Mar 01, 1965, 56 y.o., male Today's Date: 01/05/2022  OCCUPATIONAL THERAPY DISCHARGE SUMMARY  Visits from Start of Care: 6  Current functional level related to goals / functional outcomes: UTA at time of d/c given pt's physical and emotional state.    Remaining deficits: ADLs, IADLs, coordination, sensation, ROM, strength, pain, Fine motor control, Gross motor control, mobility, balance, UE functional use, challenge to living environment impacting health management   Education / Equipment: LUE putty and coordination HEP; continue with prior HEP following OT d/c for further remediation efforts as able   Patient agrees to discharge. Patient goals were not met. Patient is being discharged due to lack of progress.  PCP: Camillia Herter, NP REFERRING PROVIDER: Frann Rider, NP   OT End of Session - 01/05/22 1546     Visit Number 6    Number of Visits 9    Date for OT Re-Evaluation 01/25/21    Authorization Type [employer]    Authorization Time Period VL: PT/OT combined 30 (9 visits remaining with eval); pt has previously used 14 visits;    Authorization - Visit Number 6    Authorization - Number of Visits 9    OT Start Time 0071    OT Stop Time 1503    OT Time Calculation (min) 10 min    Activity Tolerance Patient limited by fatigue;Patient limited by lethargy;Other (comment)   Pt limited by poor oral intake   Behavior During Therapy [employer] for tasks assessed/performed              Past Medical History:  Diagnosis Date   Anxiety    Cataract    removed left eye   Chronic combined systolic and diastolic CHF (congestive heart failure) (HCC)    EF 21-97%   Diastolic dysfunction    Dyslipidemia    Hyperlipidemia    Hypertension    Neuromuscular disorder (HCC)    neuropathy left foot   Nonischemic dilated cardiomyopathy ([place])    felt secondary to HTN with no ischemia on nuclear  stress test 05/2012, EF 25-30%   Seizures ([place])    last seizure 05-2020- on Keppra   Shortness of breath    Stroke ([place]) 05/2020   Past Surgical History:  Procedure Laterality Date   COLONOSCOPY WITH PROPOFOL N/A 12/09/2020   Procedure: COLONOSCOPY WITH PROPOFOL;  Surgeon: Yetta Flock, MD;  Location: [facility];  Service: Gastroenterology;  Laterality: N/A;   KNEE ARTHROSCOPY Left 04/13/2016   Procedure: ARTHROSCOPY KNEE WITH DEBRIDEMENT;  Surgeon: Garald Balding, MD;  Location: [facility];  Service: Orthopedics;  Laterality: Left;   KNEE ARTHROSCOPY WITH MEDIAL MENISECTOMY Right 04/13/2016   Procedure: KNEE ARTHROSCOPY WITH MEDIAL MENISECTOMY;  Surgeon: Garald Balding, MD;  Location: [facility];  Service: Orthopedics;  Laterality: LEFT not right knee   NASAL FRACTURE SURGERY     SHOULDER SURGERY Left    Patient Active Problem List   Diagnosis Date Noted   Chronic diastolic CHF (congestive heart failure) ([place]) 11/07/2021   Left ventricular hypertrophy 11/07/2021   Osteoarthritis of left glenohumeral joint 05/04/2021   Colon cancer screening    Left hemiplegia ([place]) 07/02/2020   Cognitive deficit due to recent stroke 07/02/2020   Dysphagia, post-stroke    Vascular headache    Acute blood loss anemia    AKI (acute kidney injury) ([place])    Essential hypertension    Seizures ([place])  Thalamic hemorrhage ([place]) 06/22/2020   ICH (intracerebral hemorrhage) ([place]) 06/09/2020   Sprain of anterior talofibular ligament of left ankle 01/15/2020   Hyperlipidemia, on Lipitor 04/01/2017   Alcohol use disorder, mild, abuse 06/30/2016   Morbid obesity ([place]) 06/30/2016   Nonischemic dilated cardiomyopathy ([place]) 11/08/2012   Chronic combined systolic and diastolic CHF (congestive heart failure) ([place]) 11/08/2012   HTN (hypertension) 05/25/2012    ONSET DATE: 06/08/20   REFERRING DIAG:  I61.9 (ICD-10-CM) - Nontraumatic thalamic hemorrhage   G81.94  (ICD-10-CM) - Left hemiparesis    THERAPY DIAG:  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-dominant side (HCC)  Muscle weakness (generalized)  Hemiplegia and hemiparesis following nontraumatic intracerebral hemorrhage affecting left non-dominant side (HCC)  Other lack of coordination  Rationale for Evaluation and Treatment Rehabilitation  SUBJECTIVE:   SUBJECTIVE STATEMENT: He went to [facility] but they told him they were not helping anyone his age until at least after the holidays. His living situation has gotten worse and has no plans to remove himself from this situation.   Pt accompanied by: self  PERTINENT HISTORY: Update 10/31/2021 JM: Patient returns for follow-up after prior visit 7 months ago regarding history of ICH, seizures and headaches.  He is unaccompanied.  Reports residual left sided weakness and nerve pain. Was previously just in foot, but now going up his whole leg to his hip.  At prior visit with PMR, recommended trialing different medications but he wishes to try to avoid medications if able.  He questions restarting therapies.  Continues to ambulate with a cane, denies any recent falls.  Does mention left side of face and tongue feels swollen over the past 2 weeks, denies any change in diet or medications.  Denies any difficulty breathing or swallowing. Compliant on Keppra, no seizure activity. Has not had any recent headaches, currently on topiramate 50 mg twice daily. On atorvastatin, denies side effects.  Blood pressure well controlled, today 112/65.  Is being seen by cardiology next week for further blood pressure management.  No further concerns at this time.  PRECAUTIONS: Fall  WEIGHT BEARING RESTRICTIONS: No  PAIN:  Are you having pain? Yes: NPRS scale: 3/10 Pain location: L shoulder Pain description: stiff Aggravating factors: cold Relieving factors: heat; stretching reporting improvement with reps  FALLS: Has  patient fallen in last 6 months? Yes. Number of falls 2-he fell last week after taking his medications, which made him feel more drowsy.  PLOF: Requires assistive device for independence and Vocation/Vocational requirements: pt is on disability  PATIENT GOALS: improve use of LUE and overall independence with completion of functional activities.   OBJECTIVE:   HAND DOMINANCE: Right   MOBILITY STATUS: Independent; uses quad cane; increased time   ACTIVITY TOLERANCE: Activity tolerance: no reported change since CVA unless limited to pain  FUNCTIONAL OUTCOME MEASURES: Quick Dash: 45.45% disability with use of LUE  UPPER EXTREMITY ROM     AROM Right eval Left eval  Shoulder flexion WNL 120 with ataxia  Shoulder abduction WNL 120 with ataxia  Elbow flexion WNL WFL  Elbow extension WNL WFL  Wrist flexion WNL WFL  Wrist extension WNL N/A past neutral  Wrist pronation WNL WFL  Wrist supination WNL 45 degrees  (Blank rows = not tested)  L Digit flex and ext: [facility]; Digit opposition: BFL, very ataxic  UPPER EXTREMITY MMT:     MMT Right eval Left eval  Shoulder flexion WNL WFL within available ROM  Shoulder abduction WNL "  Elbow flexion WNL WFL  Elbow extension WNL WFL  (Blank rows = not tested)  HAND FUNCTION: Grip strength: Right: 123.8 lbs; Left: 14.7 lbs  COORDINATION: Box and Blocks:  Left 13 blocks (11/24/2021) Ataxic; unable to test fine motor coordination secondary to extent of severity  SENSATION: Light touch: WFL though reports decreased sensation in comparison to RUE  EDEMA: n/a  MUSCLE TONE: LUE: not formally tested this visit  COGNITION: Overall cognitive status: Within functional limits for tasks assessed  VISION: Subjective report: distance vision with L eye occasionally blurry; no recent diplopia Baseline vision:  OTC readers  VISION ASSESSMENT: To be further assessed in functional context   PERCEPTION: WFL  PRAXIS: WFL  OBSERVATIONS:  More rigidity and ataxia in L side of body compared to previous visits. Slight improvement with tasks as completed below.   TODAY'S TREATMENT:  - Therapeutic exercises completed for duration as noted below including: OT initiated red theraputty exercises (search, grip, pinch) as noted in patient instructions for coordination and strength OT initiated LUE coordination [place] as noted in patient instructions including pick up and placement of items, stacking and unstacking, and rolling a pen in between fingers and thumb.  PATIENT EDUCATION: Education details: HEP; OT d/c Person educated: Patient Education method: Explanation and Handouts Education comprehension: verbalized understanding  HOME EXERCISE PROGRAM: 11/16/2021 - reinitiated LUE cane HEP 12/14 - red putty and coordination HEP  GOALS:  SHORT TERM GOALS: Target date: 12/21/2021  Patient will demonstrate LUE ROM HEP with 25% verbal cues or less for proper execution. Baseline: Re-initiated LUE cane HEP Goal status: INITIAL  2.  Pt will demonstrate ability to pick up 9 hole peg in under 50 seconds Baseline: lacks opposition to index finger Goal status: INITIAL  3.  Will assess Box and Blocks during future visit and set goal as appropriate.  Baseline: Not tested due to time constraints Goal status: INITIAL    [place] TERM GOALS: Target date:  01/25/2022  1. Patient will demonstrate LUE coordination and strengthening HEP with 25% verbal cues or less for proper execution.  Baseline: n/a Goal status: INITIAL  2.  Patient will demonstrate at least 16%  improvement with quick Dash score (reporting 29.45% disability or less) indicating improved functional use of affected extremity.  Baseline: 45.45% disability with use of LUE Goal status: INITIAL  3.  Pt will demonstrate at least 30 lbs L grip strength as needed to lift and carry items.  Baseline: 14.7 lbs Goal status: INITIAL  4.  Pt will perform home  management and simple meal prep with good safety awareness and use of modifications/DME as needed.   Baseline:  Goal status: INITIAL    ASSESSMENT:  CLINICAL IMPRESSION: Pt remains limited by external stressors.  Due to this, progression towards goals has been limited and he no longer remains a good candidate for further skilled OT services.  Patient was provided with HEP that he can work on until he is emotionally ready for further therapy services to work on remaining deficits. Recommended pt reach out to community services, [place], and his family for support given reported living situation. No way to confirm pt's reported situation. PERFORMANCE DEFICITS: in functional skills including ADLs, IADLs, coordination, sensation, ROM, strength, pain, Fine motor control, Gross motor control, mobility, balance, and UE functional use.  IMPAIRMENTS: are limiting patient from ADLs, IADLs, and leisure.   PLAN:  OT d/c completed  Dennis Bast, OT 01/05/2022, 3:49 PM

## 2022-01-06 ENCOUNTER — Encounter: Payer: Self-pay | Admitting: Occupational Therapy

## 2022-01-06 NOTE — Patient Instructions (Signed)
  Putty Exercises All exercises to be completed TWICE daily Comments **Putty has a tendency to spread out to fit its environment. Please keep putty contained when not in use, out of the heat, out of reach of children and animals, and away from clothing/fabric as it may stick.** SEARCH Locate and pinch each individual item out of the putty. These can be COINS, BUTTONS, MARBLES, ETC. By holding putty in opposite hand, use affected hand to search and remove items. You can also do this with putty on table (as pictured). Repeat until all items have been removed from the putty.   USE:  3 items for 1 hand  OR 1-2 items for each hand    GRIP Hold the putty in the palm of your hand. Now grip the putty bending your fingers into the putty as far as you can until they quit moving. REPEAT:  10 times HOLD: until fingers quit moving or about 5 seconds    PINCH Roll up some putty to create a small tubular section. Next, pinch the putty and repeat down the section.   i.e. thumb to index finger, thumb to middle finger, thumb to ring finger, and thumb to pinky finger.        REPEAT:  5 times each finger HOLD:  at least 3 seconds   

## 2022-01-11 NOTE — Progress Notes (Deleted)
Patient ID: [name], male    DOB: [birth year]  MRN: [id number]  CC: No chief complaint on file.   Subjective: [name] is a 56 y.o. male who presents for  His concerns today include:   requesting labs (which ones?)  Last appt was physical 11/22/2021 with Andrey Campanile   Cards HTN   Patient Active Problem List   Diagnosis Date Noted   Chronic diastolic CHF (congestive heart failure) (HCC) 11/07/2021   Left ventricular hypertrophy 11/07/2021   Osteoarthritis of left glenohumeral joint 05/04/2021   Colon cancer screening    Left hemiplegia (HCC) 07/02/2020   Cognitive deficit due to recent stroke 07/02/2020   Dysphagia, post-stroke    Vascular headache    Acute blood loss anemia    AKI (acute kidney injury) (HCC)    Essential hypertension    Seizures (HCC)    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 30 tablet 2   atorvastatin (LIPITOR) 80 MG tablet Take 1 tablet (80 mg total) by mouth daily. 90 tablet 1   baclofen (LIORESAL) 10 MG tablet Take 1 tablet (10 mg total) by mouth at bedtime. 90 each 3   cloNIDine (CATAPRES) 0.1 MG tablet Take 1 tablet (0.1 mg total) by mouth 3 (three) times daily. 90 tablet 2   diclofenac Sodium (VOLTAREN) 1 % GEL Apply 2 g topically 4 (four) times daily. 200 g 0   DULoxetine (CYMBALTA) 20 MG capsule Take 1 capsule (20 mg total) by mouth daily. 90 capsule 3   folic  acid (FOLVITE) 1 MG tablet Take 1 tablet (1 mg total) by mouth daily. 30 tablet 3   gabapentin (NEURONTIN) 300 MG capsule Take 1 capsule (300 mg total) by mouth 3 (three) times daily. 270 capsule 3   labetalol (NORMODYNE) 100 MG tablet Take 1 tablet by mouth twice daily 180 tablet 1   levETIRAcetam (KEPPRA) 500 MG tablet Take 1 tablet (500 mg total) by mouth 2 (two) times daily. 180 tablet 3   losartan (COZAAR) 50 MG tablet Take 1 tablet by mouth twice daily 60 tablet 0   Multiple Vitamins-Minerals (CERTAVITE/ANTIOXIDANTS) TABS Take 1 tablet by mouth daily. 30 tablet 0   tadalafil (CIALIS) 5 MG tablet Take 5 mg by mouth every morning.     tamsulosin (FLOMAX) 0.4 MG CAPS capsule TAKE 1 CAPSULE BY MOUTH ONCE DAILY AFTER SUPPER 30 capsule 0   thiamine 100 MG tablet Take 1 tablet (100 mg total) by mouth daily. 30 tablet 0   tiZANidine (ZANAFLEX) 4 MG tablet Take 1 tablet (4 mg total) by mouth at bedtime as needed for muscle spasms. 30 tablet 0   topiramate (TOPAMAX) 50 MG tablet Take 1 tablet (50 mg total) by mouth 2 (two) times daily. 60 tablet 5   No current facility-administered medications on file prior to visit.    No Known Allergies  Social History   Socioeconomic History   Marital status: Married  Spouse name: Not on file   Number of children: Not on file   Years of education: Not on file   Highest education level: Not on file  Occupational History   Not on file  Tobacco Use   Smoking status: Former    Types: Cigarettes    Passive exposure: Past   Smokeless tobacco: Never  Vaping Use   Vaping Use: Never used  Substance and Sexual Activity   Alcohol use: Not Currently   Drug use: Not Currently    Types: Cocaine, Marijuana, Oxycodone   Sexual activity: Yes  Other Topics Concern   Not on file  Social History Narrative   Not on file   Social Determinants of Health   Financial Resource Strain: Not on file  Food Insecurity: Not on file  Transportation Needs: Not on file   Physical Activity: Not on file  Stress: Not on file  Social Connections: Not on file  Intimate Partner Violence: Not on file    Family History  Problem Relation Age of Onset   Hypertension Mother        Passed away when she is 74 secondary to hip surgery complications   CAD Mother    Heart attack Mother    Hypertension Father    Diabetes Brother    Stroke Brother    Heart attack Maternal Grandmother    Colon cancer Neg Hx    Esophageal cancer Neg Hx    Pancreatic cancer Neg Hx    Stomach cancer Neg Hx    Colon polyps Neg Hx     Past Surgical History:  Procedure Laterality Date   COLONOSCOPY WITH PROPOFOL N/A 12/09/2020   Procedure: COLONOSCOPY WITH PROPOFOL;  Surgeon: Benancio Deeds, MD;  Location: [facility];  Service: Gastroenterology;  Laterality: N/A;   KNEE ARTHROSCOPY Left 04/13/2016   Procedure: ARTHROSCOPY KNEE WITH DEBRIDEMENT;  Surgeon: Valeria Batman, MD;  Location: [facility];  Service: Orthopedics;  Laterality: Left;   KNEE ARTHROSCOPY WITH MEDIAL MENISECTOMY Right 04/13/2016   Procedure: KNEE ARTHROSCOPY WITH MEDIAL MENISECTOMY;  Surgeon: Valeria Batman, MD;  Location: [facility];  Service: Orthopedics;  Laterality: LEFT not right knee   NASAL FRACTURE SURGERY     SHOULDER SURGERY Left     ROS: Review of Systems Negative except as stated above  PHYSICAL EXAM: There were no vitals taken for this visit.  Physical Exam  {male adult master:310786} {male adult master:310785}     Latest Ref Rng & Units 11/23/2021   11:33 AM 05/11/2021    4:52 PM 09/29/2020    4:45 PM  CMP  Glucose 70 - 99 mg/dL 77  75  88   BUN 6 - 24 mg/dL 14  16  14    Creatinine 0.76 - 1.27 mg/dL  8.18  5.63   Sodium 134 - 144 mmol/L 144  144  143   Potassium 3.5 - 5.2 mmol/L 4.0  3.9  3.9   Chloride 96 - 106 mmol/L 106  111  107   CO2 20 - 29 mmol/L 24  19  20    Calcium 8.7 - 10.2 mg/dL 1.49  9.7    Total Protein 6.0 - 8.5  g/dL 7.3   7.7   Total Bilirubin 0.0 - 1.2 mg/dL 0.5   0.4   Alkaline Phos 44 - 121 IU/L 73   105   AST 0 - 40 IU/L 22   18   ALT 0 - 44  IU/L 24   22    Lipid Panel     Component Value Date/Time   CHOL 168 10/31/2021 1520   TRIG 132 10/31/2021 1520   HDL 59 10/31/2021 1520   CHOLHDL 2.8 10/31/2021 1520   CHOLHDL 3.8 06/09/2020 1809   VLDL 66 (H) 06/09/2020 1809   LDLCALC 86 10/31/2021 1520    CBC    Component Value Date/Time   WBC 4.7 11/23/2021 1133   WBC 6.0 07/19/2020 0530   RBC 4.58 11/23/2021 1133   RBC 3.61 (L) 07/19/2020 0530   HGB 13.9 11/23/2021 1133   HCT 42.0 11/23/2021 1133   PLT 314 11/23/2021 1133   MCV 92 11/23/2021 1133   MCH 30.3 11/23/2021 1133   MCH 31.6 07/19/2020 0530   MCHC 33.1 11/23/2021 1133   MCHC 33.4 07/19/2020 0530   RDW 11.2 (L) 11/23/2021 1133   LYMPHSABS 1.6 11/23/2021 1133   MONOABS 0.8 06/28/2020 1103   EOSABS 0.1 11/23/2021 1133   BASOSABS 0.0 11/23/2021 1133    ASSESSMENT AND PLAN:  There are no diagnoses linked to this encounter.   Patient was given the opportunity to ask questions.  Patient verbalized understanding of the plan and was able to repeat key elements of the plan. Patient was given clear instructions to go to Emergency Department or return to medical center if symptoms don't improve, worsen, or new problems develop.The patient verbalized understanding.   No orders of the defined types were placed in this encounter.    Requested Prescriptions    No prescriptions requested or ordered in this encounter    No follow-ups on file.  Camillia Herter, NP

## 2022-01-19 ENCOUNTER — Other Ambulatory Visit: Payer: Self-pay | Admitting: Physical Medicine and Rehabilitation

## 2022-01-19 ENCOUNTER — Other Ambulatory Visit: Payer: Self-pay | Admitting: Family

## 2022-01-19 DIAGNOSIS — I10 Essential (primary) hypertension: Secondary | ICD-10-CM

## 2022-01-20 ENCOUNTER — Ambulatory Visit: Payer: BC Managed Care – PPO | Admitting: Family

## 2022-01-20 NOTE — Telephone Encounter (Signed)
Patient established with Cardiology. Last appointment with them 11/07/2021. Courtesy refill ordered. Please make patient aware that future refills to be managed by Cardiology.

## 2022-01-24 ENCOUNTER — Encounter
Payer: BC Managed Care – PPO | Attending: Physical Medicine and Rehabilitation | Admitting: Physical Medicine and Rehabilitation

## 2022-01-24 DIAGNOSIS — Z630 Problems in relationship with spouse or partner: Secondary | ICD-10-CM | POA: Diagnosis not present

## 2022-01-24 NOTE — Progress Notes (Signed)
Subjective:    Patient ID: [name], male    DOB: 1965-05-27, 57 y.o.   MRN: [id number]  HPI An audio/video tele-health visit is felt to be the most appropriate encounter for this patient at this time. This is a follow up tele-visit via phone. The patient is at home. MD is at office. Prior to scheduling this appointment, our staff discussed the limitations of evaluation and management by telemedicine and the availability of in-person appointments. The patient expressed understanding and agreed to proceed.   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   1) CVA -Incredible improvement in ambulation!  -graduated from therapy -Neuropathy is moving up to neck.  -was released from the hospital one year today -she is doing exercises.  -left hand is getting on his nerves -he thinks -his therapists are planning to take him to the pool and told him this may help with his arm pain -pain has been severe at home, as it had been in hospital -his wife purchased 500mg  tablets of tylenol -he is worried about his decreased upper extremity strength and asks if this will ever improve -he is not experiencing much shoulder pain right now -he does have some strength -the benefits from the trigger point injections lwore off within 1 week -pain is severely limiting his function -he is taking Tizanidine and it does not help, does make him sleepy, but his wife is trying to encourage him to stay up during the day -he developed the foot pain a couple of weeks ago -he was wearing a boot at that time so took it off but has still had pain a couple of weeks afterward. -he has not tried anything for this pain -it is worst at night and feels like burning and tingling at his heel. -he has been doing great with therapy and is walking  in his home! -his wife asks if he could get a handicap placard.  -the foot is still feeling numb with pins and needles, also painful. He is unsure whether Gabapentin helps- so far nothing he as tried has helped his neuropathy. His balance is also now off and he has been stumbling more. He actually felt that the Gabapentin made his pain worse. He has never tried Cymbalta but is willing to try it.  -Has been able to use bathroom himself! -he is working with therapy right now.  -he was feeling wobbly over the weekend so had to miss his PT session -shoulder is feeling very numb and cannot make a fist -ready to proceed with Qutenza today -His wife asks for refill of Eucerin and Diclofenac and Tizanidine.  -She asks whether he should still be taking Seroquel -he called medical records and records were faxed on December 28th.  -He called our office today  -He needs medical records to process his disability claims -able to walk much better.  -his wife mentioned to me that she is still worried about him driving. He has been driving around locally to pharmacy   2) Lower extremity edema -worsening -not painful -he asks when his next appointment with me is.  -has gotten used to it -therapist was concerned -painful -sometimes feels cold on the left foot -never took the fluid pills -he is hoping to walk more now that the weather has improved  3) Headaches -started last  week and felt loss of balance -this stopped him from doing therapy.  -still having tension in head.  -he takes topamax 25mg  HS.   4) Obesity -he has lost 36 lbs! -214 lbs   5) Left shoulder pain -intermittent -sometimes he uses voltaren gel and this seems to help the most  6) Left upper extremity neuropathy -started last month -received steroid injection 2 months ago. Asks if this is a side effect of the steroid injection -asks about a supplement and if this is ok to take -he asks if there is anything else he can do for  this  7) Cervical myofascial pain syndrome -he wants to know if injections could help  8) Neuropathy is getting worse -he gets depressed about this.   9) Urinary frequency: -notes that this started after taking the Baclofen, but his wife said she never gave it to him -he did take the cymbalta but stopped due to this potential side effect -he is urinating every 5-10 minutes -he is willing to get UA and UC drawn today.   10) Problems with wife -he believes his wife is trying to tell his healthcare providers he has dementia -he also feels that one day his cane was filled with water to make it more difficult for him to use and that she did this, he says this was witnessed by his therapy team -his family is unaware of this as they have moved and he has not yet discussed with them -he would like his medical records to be restricted from his wife.  -he asks if I can help him find alternative housing    Family History  Problem Relation Age of Onset   Hypertension Mother        Passed away when she is 54 secondary to hip surgery complications   CAD Mother    Heart attack Mother    Hypertension Father    Diabetes Brother    Stroke Brother    Heart attack Maternal Grandmother    Colon cancer Neg Hx    Esophageal cancer Neg Hx    Pancreatic cancer Neg Hx    Stomach cancer Neg Hx    Colon polyps Neg Hx    Social History   Socioeconomic History   Marital status: Married    Spouse name: Not on file   Number of children: Not on file   Years of education: Not on file   Highest education level: Not on file  Occupational History   Not on file  Tobacco Use   Smoking status: Former    Types: Cigarettes    Passive exposure: Past   Smokeless tobacco: Never  Vaping Use   Vaping Use: Never used  Substance and Sexual Activity   Alcohol use: Not Currently   Drug use: Not Currently    Types: Cocaine, Marijuana, Oxycodone   Sexual activity: Yes  Other Topics Concern   Not on file   Social History Narrative   Not on file   Social Determinants of Health   Financial Resource Strain: Not on file  Food Insecurity: Not on file  Transportation Needs: Not on file  Physical Activity: Not on file  Stress: Not on file  Social Connections: Not on file   Past Surgical History:  Procedure Laterality Date   COLONOSCOPY WITH PROPOFOL N/A 12/09/2020   Procedure: COLONOSCOPY WITH PROPOFOL;  Surgeon: 12/11/2020, MD;  Location: [facility];  Service: Gastroenterology;  Laterality: N/A;   KNEE ARTHROSCOPY Left 04/13/2016  Procedure: ARTHROSCOPY KNEE WITH DEBRIDEMENT;  Surgeon: Valeria Batman, MD;  Location: [facility];  Service: Orthopedics;  Laterality: Left;   KNEE ARTHROSCOPY WITH MEDIAL MENISECTOMY Right 04/13/2016   Procedure: KNEE ARTHROSCOPY WITH MEDIAL MENISECTOMY;  Surgeon: Valeria Batman, MD;  Location: [facility];  Service: Orthopedics;  Laterality: LEFT not right knee   NASAL FRACTURE SURGERY     SHOULDER SURGERY Left    Past Medical History:  Diagnosis Date   Anxiety    Cataract    removed left eye   Chronic combined systolic and diastolic CHF (congestive heart failure) (HCC)    EF 25-30%   Diastolic dysfunction    Dyslipidemia    Hyperlipidemia    Hypertension    Neuromuscular disorder (HCC)    neuropathy left foot   Nonischemic dilated cardiomyopathy (HCC)    felt secondary to HTN with no ischemia on nuclear stress test 05/2012, EF 25-30%   Seizures (HCC)    last seizure 05-2020- on Keppra   Shortness of breath    Stroke (HCC) 05/2020   There were no vitals taken for this visit.  Opioid Risk Score:   Fall Risk Score:  `1  Depression screen [facility] 2/9     11/22/2021   10:27 AM 09/14/2021    2:14 PM 07/21/2021    9:38 AM 03/21/2021   12:36 PM 09/22/2020   11:01 AM 09/14/2020    4:02 PM 09/14/2020    2:29 PM  Depression screen PHQ 2/9  Decreased Interest 0 0 0 0 0 0 0  Down, Depressed, Hopeless 0 0 0 0 0  0 0  PHQ - 2 Score 0 0 0 0 0 0 0  Altered sleeping 0     0   Tired, decreased energy 0     0   Change in appetite 0     0   Feeling bad or failure about yourself  0     0   Trouble concentrating 0     0   Moving slowly or fidgety/restless 0     0   Suicidal thoughts 0     0   PHQ-9 Score 0     0   Difficult doing work/chores Not difficult at all    Not difficult at all      Review of Systems     Objective:   Physical Exam Not performed as patient was seen via phone.    Assessment & Plan:  [name] is a 57 year old man presenting today for follow-up of left hemiplegia, right sided low back pain, and left sided foot pain following stroke, left shoulder pain.  1) Right sided low back pain: -discussed with patient and wife that Xrs show spinal degeneration -pain appears to be more myofascial -trigger point injections with lidocaine provided <1 week of benefit -Would like to try Botox injections for longer relief as this pain is greatly inhibiting patient's function and quality of life -continue heat and lidocaine patch and home physical and occupational therapy -Can take tylenol 500mg  three times per day -minimize use of Norco as much as possible as not benefiting and is a high risk opioid -can continue tizandine as needed.   2) left sided foot neuropathic pain -stop Gabapentin due to somnolence/lack of efficacy.  -encouraged his exercise -he is purchasing a stimulator that he plans to use on his arm and foot.  -start Cymbalta 20mg  daily, discussed side effects of nausea.  -  recommended icing 15 minutes three times per day -hold off on therapy until neuropathy improves -can keep compression garment and boot off since these were worsening his pain -Will not repeat Qutenza since it did not did help foot pain -Turmeric to reduce inflammation--can be used in cooking or taken as a supplement.  Benefits of turmeric:  -Highly anti-inflammatory  -Increases  antioxidants  -Improves memory, attention, brain disease  -Lowers risk of heart disease  -May help prevent cancer  -Decreases pain  -Alleviates depression  -Delays aging and decreases risk of chronic disease  -Consume with black pepper to increase absorption    3) Right thalamic hemorrhage -provided with a handicap placard.  --progress to quad walker -face to face for AFO today -discussed that most motor improvement occurs in the first 4 weeks, but improvement is still seen in the first 6 months, and some improvement may still be seen in the first 2 weeks -evaluated for vagal nerve stimulation trial for unfortunately will not qualify due to his stroke being hemorrhagic -will send him information about deep brain stimulation, discussed with him that I have seen this help one of my patients  4) Left sided arm pain secondary to spasticity -plan for Botox 100U next visit -has felt numbness and weakness in his upper extremity.  -refilled tizanidine, lidocaine patch, and diclofenac gel -can consider repeating Qutenza as it did help with his shoulder pain.  -continue tylenol before sleep -discussed only doing steroid injection if he is having pain, as pain is currently controlled with muscle rub -discussed MRI results:  1. Mild-moderate rotator cuff tendinosis without tear. 2. Severe intra-articular biceps tendinosis with mild tenosynovitis. 3. 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  -continue therapy -100U Botox next visit to LUE -continue voltaren gel  -discussed risks and benefits of steroid injection  5) Agitation: resolved, may discontinue seroquel.  6) Bilateral lower extremity edema -recommended avoiding added salt -prescribed lasix 20mg  daily prn  7) HTN 133/75 -commended on improvements in blood pressure! -Advised checking BP daily at home and logging results to bring into follow-up appointment with PCP and myself. -Start Baclofen 10mg   HS -Reviewed BP meds today.  -Advised regarding healthy foods that can help lower blood pressure and provided with a list: 1) citrus foods- high in vitamins and minerals 2) salmon and other fatty fish - reduces inflammation and oxylipins 3) swiss chard (leafy green)- high level of nitrates 4) pumpkin seeds- one of the best natural sources of magnesium 5) Beans and lentils- high in fiber, magnesium, and potassium 6) Berries- high in flavonoids 7) Amaranth (whole grain, can be cooked similarly to rice and oats)- high in magnesium and fiber 8) Pistachios- even more effective at reducing BP than other nuts 9) Carrots- high in phenolic compounds that relax blood vessels and reduce inflammation 10) Celery- contain phthalides that relax tissues of arterial walls 11) Tomatoes- can also improve cholesterol and reduce risk of heart disease 12) Broccoli- good source of magnesium, calcium, and potassium 13) Greek yogurt: high in potassium and calcium 14) Herbs and spices: Celery seed, cilantro, saffron, lemongrass, black cumin, ginseng, cinnamon, cardamom, sweet basil, and ginger 15) Chia and flax seeds- also help to lower cholesterol and blood sugar 16) Beets- high levels of nitrates that relax blood vessels  17) spinach and bananas- high in potassium  -Provided lise of supplements that can help with hypertension:  1) magnesium: one high quality brand is Bioptemizers since it contains all 7 types of magnesium, otherwise over the  counter magnesium gluconate 400mg  is a good option 2) B vitamins 3) vitamin D 4) potassium 5) CoQ10 6) L-arginine 7) Vitamin C 8) Beetroot -Educated that goal BP is 120/80. -Made goal to incorporate some of the above foods into diet.    8) Obesity -commended on 36 pound weight loss! Discussed how this can help with his pain as well -current BMI is 31.01 -weight 210 lbs today -Discussed the benefits of intermittent fasting. Recommended starting with pushing dinner 15  minutes earlier and when this feels easy, continuing to push dinner 15 minutes earlier. Discussed that this can help her body to improve its ability to burn fat rather than glucose, improving insulin sensitivity. Recommended drinking Roobois tea in the evening to help curb appetite and for its numerous health benefits.    9) Left shoulder pain -XR ordered and results discussed with patient: shows subluxation and glenohumeral arthritis. Discussed applying volataren gel or CBD oil, using heat pad, continuing therapy and avoiding movements that worsen the pain  -plan for MRI of shoulder.   -Discussed Sprint PNS system as an option of pain treatment via neuromodulation. Provided following link for patient to learn more about the system: https://www.sprtherapeutics.com/. Explained mechanism of activating A beta fibers which function in touch, pressure, and vibration, to inhibit the sensations of A delta and C fibers which are responsible for pain transmission.    -will change next appointment to a steroid injection, discussed risks and benefits of steroid injection an will only perform if pain has failed to improve with above interventions.  -Discussed current symptoms of pain and history of pain.  -Discussed benefits of exercise in reducing pain. -Discussed following foods that may reduce pain: 1) Ginger (especially studied for arthritis)- reduce leukotriene production to decrease inflammation 2) Blueberries- high in phytonutrients that decrease inflammation 3) Salmon- marine omega-3s reduce joint swelling and pain 4) Pumpkin seeds- reduce inflammation 5) dark chocolate- reduces inflammation 6) turmeric- reduces inflammation 7) tart cherries - reduce pain and stiffness 8) extra virgin olive oil - its compound olecanthal helps to block prostaglandins  9) chili peppers- can be eaten or applied topically via capsaicin 10) mint- helpful for headache, muscle aches, joint pain, and itching 11) garlic-  reduces inflammation  Link to further information on diet for chronic pain: http://www.randall.com/   Turmeric to reduce inflammation--can be used in cooking or taken as a supplement.  Benefits of turmeric:  -Highly anti-inflammatory  -Increases antioxidants  -Improves memory, attention, brain disease  -Lowers risk of heart disease  -May help prevent cancer  -Decreases pain  -Alleviates depression  -Delays aging and decreases risk of chronic disease  -Consume with black pepper to increase absorption    Turmeric Milk Recipe:  1 cup milk  1 tsp turmeric  1 tsp cinnamon  1 tsp grated ginger (optional)  Black pepper (boosts the anti-inflammatory properties of turmeric).  1 tsp honey    10) Left upper extremity neuropathy -discussed that it is not likely associated with steroid injection since started 1 month afterward -discussed that since he does have neck pain, it may be beneficial to get an XR of his neck to assess for stenosis -reviewed the ingredients in the supplement he is interested in and discussed that these have shown benefit for neuropathic pain  11) Thoracic spine pain -XR of thoracic spine ordered.   12) Insomnia:  Can consider baclofen 10mg  HS  13) Urinary frequency -UA/UC ordered -discussed that I will call tomorrow with results.   14) Problems  with wife -discussed his current suspicions of his wife, empathized -discussed that we would not communicate with his wife moving forward, only directly with him -placed social work referral for assistance to find alternative housing for him.  -encouraged him to file police report if wife is entering MyChart without his permission  6 minutes spent in discussion of his marital problems with his wife, his desire to find alternative housing and his request for help with this

## 2022-01-25 ENCOUNTER — Telehealth: Payer: Self-pay | Admitting: Family

## 2022-01-25 NOTE — Telephone Encounter (Signed)
I spoke to the patient and he explained that he does not have permanent housing. He has been moving from place to place.  He said he is independent with personal care and has a walker and cane to use with ambulation., he also has a car and is driving.  He receives disability: $1213/month.  He said he tried contacting the [facility] but was not able to get any information.  He made that attempt to contact the [facility] around the holidays and the staff was not available.   I encouraged him to contact the [facility] again, I provided him with the phone number : [phone] and instructed him to ask to speak with the caseworker who assists with housing.  They may have motels rooms available as they had last winter. I also provided him with contact numbers for [employer], [employer].  He said he has contacted DSS and they were not able to help him   He was very appreciative and said he would make the calls. I told him that he can call me back if he has additional questions.

## 2022-01-25 NOTE — Telephone Encounter (Signed)
[name] is a pt of Amy Minette Brine he is requesting assistance with housing,

## 2022-02-03 ENCOUNTER — Other Ambulatory Visit: Payer: Self-pay | Admitting: Family

## 2022-02-03 DIAGNOSIS — I10 Essential (primary) hypertension: Secondary | ICD-10-CM

## 2022-02-03 NOTE — Telephone Encounter (Signed)
Requested Prescriptions  Pending Prescriptions Disp Refills   amLODipine (NORVASC) 10 MG tablet [Pharmacy Med Name: amLODIPine Besylate 10 MG Oral Tablet] 90 tablet 0    Sig: Take 1 tablet by mouth once daily     Cardiovascular: Calcium Channel Blockers 2 Passed - 02/03/2022  8:46 AM      Passed - Last BP in normal range    BP Readings from Last 1 Encounters:  11/22/21 128/79         Passed - Last Heart Rate in normal range    Pulse Readings from Last 1 Encounters:  11/22/21 78         Passed - Valid encounter within last 6 months    Recent Outpatient Visits           2 months ago Annual physical exam   [facility] at [facility], MD   4 months ago Essential (primary) hypertension   Primary Care at [facility], Amy J, NP   8 months ago Essential (primary) hypertension   Primary Care at [facility], Amy J, NP   1 year ago Encounter for completion of form with patient   Primary Care at [facility], Amy J, NP   1 year ago Annual physical exam   Primary Care at [facility], Flonnie Hailstone, NP       Future Appointments             In 2 weeks Camillia Herter, NP Primary Care at [facility]

## 2022-02-06 ENCOUNTER — Ambulatory Visit: Payer: BC Managed Care – PPO | Admitting: Physical Medicine and Rehabilitation

## 2022-02-16 ENCOUNTER — Other Ambulatory Visit: Payer: Self-pay | Admitting: Physical Medicine and Rehabilitation

## 2022-02-17 NOTE — Progress Notes (Unsigned)
Patient ID: [name], male    DOB: 01-08-1966  MRN: [id number]  CC: No chief complaint on file.   Subjective: [name] is a 57 y.o. male who presents for  His concerns today include:   requesting labs/ body odor  Neuro  Uro  Cards   Patient Active Problem List   Diagnosis Date Noted   Chronic diastolic CHF (congestive heart failure) (HCC) 11/07/2021   Left ventricular hypertrophy 11/07/2021   Osteoarthritis of left glenohumeral joint 05/04/2021   Colon cancer screening    Left hemiplegia (HCC) 07/02/2020   Cognitive deficit due to recent stroke 07/02/2020   Dysphagia, post-stroke    Vascular headache    Acute blood loss anemia    AKI (acute kidney injury) (HCC)    Essential hypertension    Seizures (HCC)    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   ICH (intracerebral hemorrhage) (HCC) 06/09/2020   Sprain of anterior talofibular ligament of left ankle 01/15/2020   Hyperlipidemia, on Lipitor 04/01/2017   Alcohol use disorder, mild, abuse 06/30/2016   Morbid obesity (HCC) 06/30/2016   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   Chronic combined systolic and diastolic CHF (congestive heart failure) (HCC) 11/08/2012   HTN (hypertension) 05/25/2012     Current Outpatient Medications on File Prior to Visit  Medication Sig Dispense Refill   folic acid (FOLVITE) 1 MG tablet Take 1 tablet by mouth once daily 30 tablet 0   amLODipine (NORVASC) 10 MG tablet Take 1 tablet by mouth once daily 90 tablet 0   atorvastatin (LIPITOR) 80 MG tablet Take 1 tablet (80 mg total) by mouth daily. 90 tablet 1   baclofen (LIORESAL) 10 MG tablet Take 1 tablet (10 mg total) by mouth at bedtime. 90 each 3   cloNIDine (CATAPRES) 0.1 MG tablet Take 1 tablet (0.1 mg total) by mouth 3 (three) times daily. 90 tablet 2   diclofenac Sodium (VOLTAREN) 1 % GEL Apply 2 g topically 4 (four) times daily. 200 g 0   DULoxetine (CYMBALTA) 20 MG capsule Take 1 capsule (20 mg total) by mouth  daily. 90 capsule 3   gabapentin (NEURONTIN) 300 MG capsule Take 1 capsule (300 mg total) by mouth 3 (three) times daily. 270 capsule 3   labetalol (NORMODYNE) 100 MG tablet Take 1 tablet by mouth twice daily 180 tablet 1   levETIRAcetam (KEPPRA) 500 MG tablet Take 1 tablet (500 mg total) by mouth 2 (two) times daily. 180 tablet 3   losartan (COZAAR) 50 MG tablet Take 1 tablet by mouth twice daily 60 tablet 2   Multiple Vitamins-Minerals (CERTAVITE/ANTIOXIDANTS) TABS Take 1 tablet by mouth daily. 30 tablet 0   tadalafil (CIALIS) 5 MG tablet Take 5 mg by mouth every morning.     tamsulosin (FLOMAX) 0.4 MG CAPS capsule TAKE 1 CAPSULE BY MOUTH ONCE DAILY AFTER SUPPER 30 capsule 0   thiamine 100 MG tablet Take 1 tablet (100 mg total) by mouth daily. 30 tablet 0   tiZANidine (ZANAFLEX) 4 MG tablet Take 1 tablet (4 mg total) by mouth at bedtime as needed for muscle spasms. 30 tablet 0   topiramate (TOPAMAX) 50 MG tablet Take 1 tablet (50 mg total) by mouth 2 (two) times daily. 60 tablet 5   No current facility-administered medications on file prior to visit.    No Known Allergies  Social History   Socioeconomic History   Marital status: Married    Spouse name: Not on file   Number of  children: Not on file   Years of education: Not on file   Highest education level: Not on file  Occupational History   Not on file  Tobacco Use   Smoking status: Former    Types: Cigarettes    Passive exposure: Past   Smokeless tobacco: Never  Vaping Use   Vaping Use: Never used  Substance and Sexual Activity   Alcohol use: Not Currently   Drug use: Not Currently    Types: Cocaine, Marijuana, Oxycodone   Sexual activity: Yes  Other Topics Concern   Not on file  Social History Narrative   Not on file   Social Determinants of Health   Financial Resource Strain: Not on file  Food Insecurity: Not on file  Transportation Needs: Not on file  Physical Activity: Not on file  Stress: Not on file   Social Connections: Not on file  Intimate Partner Violence: Not on file    Family History  Problem Relation Age of Onset   Hypertension Mother        Passed away when she is 72 secondary to hip surgery complications   CAD Mother    Heart attack Mother    Hypertension Father    Diabetes Brother    Stroke Brother    Heart attack Maternal Grandmother    Colon cancer Neg Hx    Esophageal cancer Neg Hx    Pancreatic cancer Neg Hx    Stomach cancer Neg Hx    Colon polyps Neg Hx     Past Surgical History:  Procedure Laterality Date   COLONOSCOPY WITH PROPOFOL N/A 12/09/2020   Procedure: COLONOSCOPY WITH PROPOFOL;  Surgeon: Yetta Flock, MD;  Location: [facility];  Service: Gastroenterology;  Laterality: N/A;   KNEE ARTHROSCOPY Left 04/13/2016   Procedure: ARTHROSCOPY KNEE WITH DEBRIDEMENT;  Surgeon: Garald Balding, MD;  Location: [facility];  Service: Orthopedics;  Laterality: Left;   KNEE ARTHROSCOPY WITH MEDIAL MENISECTOMY Right 04/13/2016   Procedure: KNEE ARTHROSCOPY WITH MEDIAL MENISECTOMY;  Surgeon: Garald Balding, MD;  Location: [facility];  Service: Orthopedics;  Laterality: LEFT not right knee   NASAL FRACTURE SURGERY     SHOULDER SURGERY Left     ROS: Review of Systems Negative except as stated above  PHYSICAL EXAM: There were no vitals taken for this visit.  Physical Exam  {male adult master:310786} {male adult master:310785}     Latest Ref Rng & Units 11/23/2021   11:33 AM 05/11/2021    4:52 PM 09/29/2020    4:45 PM  CMP  Glucose 70 - 99 mg/dL 77  75  88   BUN 6 - 24 mg/dL 14  16  14    Creatinine 0.76 - 1.27 mg/dL 1.11  1.10  1.05   Sodium 134 - 144 mmol/L 144  144  143   Potassium 3.5 - 5.2 mmol/L 4.0  3.9  3.9   Chloride 96 - 106 mmol/L 106  111  107   CO2 20 - 29 mmol/L 24  19  20    Calcium 8.7 - 10.2 mg/dL 10.2  9.7  10.3   Total Protein 6.0 - 8.5 g/dL 7.3   7.7   Total Bilirubin 0.0 - 1.2 mg/dL 0.5    0.4   Alkaline Phos 44 - 121 IU/L 73   105   AST 0 - 40 IU/L 22   18   ALT 0 - 44 IU/L 24   22    Lipid  Panel     Component Value Date/Time   CHOL 168 10/31/2021 1520   TRIG 132 10/31/2021 1520   HDL 59 10/31/2021 1520   CHOLHDL 2.8 10/31/2021 1520   CHOLHDL 3.8 06/09/2020 1809   VLDL 66 (H) 06/09/2020 1809   LDLCALC 86 10/31/2021 1520    CBC    Component Value Date/Time   WBC 4.7 11/23/2021 1133   WBC 6.0 07/19/2020 0530   RBC 4.58 11/23/2021 1133   RBC 3.61 (L) 07/19/2020 0530   HGB 13.9 11/23/2021 1133   HCT 42.0 11/23/2021 1133   PLT 314 11/23/2021 1133   MCV 92 11/23/2021 1133   MCH 30.3 11/23/2021 1133   MCH 31.6 07/19/2020 0530   MCHC 33.1 11/23/2021 1133   MCHC 33.4 07/19/2020 0530   RDW 11.2 (L) 11/23/2021 1133   LYMPHSABS 1.6 11/23/2021 1133   MONOABS 0.8 06/28/2020 1103   EOSABS 0.1 11/23/2021 1133   BASOSABS 0.0 11/23/2021 1133    ASSESSMENT AND PLAN:  There are no diagnoses linked to this encounter.   Patient was given the opportunity to ask questions.  Patient verbalized understanding of the plan and was able to repeat key elements of the plan. Patient was given clear instructions to go to Emergency Department or return to medical center if symptoms don't improve, worsen, or new problems develop.The patient verbalized understanding.   No orders of the defined types were placed in this encounter.    Requested Prescriptions    No prescriptions requested or ordered in this encounter    No follow-ups on file.  Camillia Herter, NP

## 2022-02-19 ENCOUNTER — Other Ambulatory Visit: Payer: Self-pay | Admitting: Family

## 2022-02-19 DIAGNOSIS — E782 Mixed hyperlipidemia: Secondary | ICD-10-CM

## 2022-02-20 ENCOUNTER — Other Ambulatory Visit (HOSPITAL_COMMUNITY)
Admission: RE | Admit: 2022-02-20 | Discharge: 2022-02-20 | Disposition: A | Discharge status: Home / Self Care | Payer: BC Managed Care – PPO | Source: Ambulatory Visit | Attending: Family | Admitting: Family

## 2022-02-20 ENCOUNTER — Encounter: Payer: Self-pay | Admitting: Family

## 2022-02-20 ENCOUNTER — Ambulatory Visit (INDEPENDENT_AMBULATORY_CARE_PROVIDER_SITE_OTHER): Payer: BC Managed Care – PPO | Admitting: Family

## 2022-02-20 ENCOUNTER — Telehealth: Payer: Self-pay | Admitting: Family

## 2022-02-20 VITALS — BP 123/73 | HR 85 | Temp 99.2°F | Resp 16 | Ht 70.0 in | Wt 177.0 lb

## 2022-02-20 DIAGNOSIS — Z59819 Housing instability, housed unspecified: Secondary | ICD-10-CM | POA: Diagnosis not present

## 2022-02-20 DIAGNOSIS — Z113 Encounter for screening for infections with a predominantly sexual mode of transmission: Secondary | ICD-10-CM

## 2022-02-20 NOTE — Telephone Encounter (Signed)
Requested by interface surescripts. Medication dose discontinued 11/01/21.  Requested Prescriptions  Refused Prescriptions Disp Refills   atorvastatin (LIPITOR) 40 MG tablet [Pharmacy Med Name: Atorvastatin Calcium 40 MG Oral Tablet] 120 tablet 0    Sig: TAKE 1 TABLET BY MOUTH ONCE DAILY AT  6PM     Cardiovascular:  Antilipid - Statins Failed - 02/19/2022  7:36 PM      Failed - Lipid Panel in normal range within the last 12 months    Cholesterol, Total  Date Value Ref Range Status  10/31/2021 168 100 - 199 mg/dL Final   LDL Chol Calc (NIH)  Date Value Ref Range Status  10/31/2021 86 0 - 99 mg/dL Final   HDL  Date Value Ref Range Status  10/31/2021 59 >39 mg/dL Final   Triglycerides  Date Value Ref Range Status  10/31/2021 132 0 - 149 mg/dL Final         Passed - Patient is not pregnant      Passed - Valid encounter within last 12 months    Recent Outpatient Visits           Today Housing insecurity   [facility], [place], NP   3 months ago Annual physical exam   [place] [facility], MD   5 months ago Essential (primary) hypertension   [place] [facility], Amy J, NP   9 months ago Essential (primary) hypertension   [place] [facility] at [facility], [place], NP   1 year ago Encounter for completion of form with patient   [facility] at [facility], Flonnie Hailstone, NP

## 2022-02-20 NOTE — Progress Notes (Signed)
States he no longer needs labs  States he notices a sour body odor since leaving the hospital  Needs paperwork for housing from LCSW or Case Management.

## 2022-02-21 ENCOUNTER — Other Ambulatory Visit: Payer: Self-pay | Admitting: Family

## 2022-02-21 DIAGNOSIS — E782 Mixed hyperlipidemia: Secondary | ICD-10-CM

## 2022-02-21 LAB — URINE CYTOLOGY ANCILLARY ONLY
Chlamydia: NEGATIVE
Comment: NEGATIVE
Comment: NEGATIVE
Comment: NORMAL
Neisseria Gonorrhea: NEGATIVE
Trichomonas: NEGATIVE

## 2022-02-22 NOTE — Telephone Encounter (Signed)
Requested Prescriptions  Pending Prescriptions Disp Refills   atorvastatin (LIPITOR) 40 MG tablet [Pharmacy Med Name: Atorvastatin Calcium 40 MG Oral Tablet] 120 tablet 0    Sig: TAKE 1 TABLET BY MOUTH ONCE DAILY AT  6PM     Cardiovascular:  Antilipid - Statins Failed - 02/21/2022  8:45 PM      Failed - Lipid Panel in normal range within the last 12 months    Cholesterol, Total  Date Value Ref Range Status  10/31/2021 168 100 - 199 mg/dL Final   LDL Chol Calc (NIH)  Date Value Ref Range Status  10/31/2021 86 0 - 99 mg/dL Final   HDL  Date Value Ref Range Status  10/31/2021 59 >39 mg/dL Final   Triglycerides  Date Value Ref Range Status  10/31/2021 132 0 - 149 mg/dL Final         Passed - Patient is not pregnant      Passed - Valid encounter within last 12 months    Recent Outpatient Visits           2 days ago Housing insecurity   [place] at [name], [place], NP   3 months ago Annual physical exam   [place] [facility], MD   5 months ago Essential (primary) hypertension   [place] [facility] at [facility], Amy J, NP   9 months ago Essential (primary) hypertension   [place] [facility] at [facility], [place], NP   1 year ago Encounter for completion of form with patient   [facility] at [facility], Fl[place]nie Hailstone, NP

## 2022-02-23 ENCOUNTER — Telehealth: Payer: Self-pay | Admitting: Family

## 2022-02-23 NOTE — Telephone Encounter (Signed)
Documented in another encounter with patient today.

## 2022-02-23 NOTE — Telephone Encounter (Signed)
Copied from [place] [id number]. Topic: General - Other >> Feb 23, 2022 12:08 PM Everette C wrote: Reason for CRM: The patient would like to be contacted by Etter Sjogren when possible regarding housing   Please contact further when possible

## 2022-03-10 ENCOUNTER — Encounter: Payer: Self-pay | Admitting: Physical Medicine and Rehabilitation

## 2022-03-10 ENCOUNTER — Encounter
Payer: BC Managed Care – PPO | Attending: Physical Medicine and Rehabilitation | Admitting: Physical Medicine and Rehabilitation

## 2022-03-10 VITALS — BP 131/75 | HR 77 | Ht 70.0 in | Wt 179.0 lb

## 2022-03-10 DIAGNOSIS — Z630 Problems in relationship with spouse or partner: Secondary | ICD-10-CM | POA: Insufficient documentation

## 2022-03-10 DIAGNOSIS — G629 Polyneuropathy, unspecified: Secondary | ICD-10-CM | POA: Insufficient documentation

## 2022-03-10 DIAGNOSIS — E663 Overweight: Secondary | ICD-10-CM | POA: Diagnosis not present
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
## 2022-03-16 NOTE — Telephone Encounter (Signed)
Thank you :)
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
## 2022-03-17 ENCOUNTER — Other Ambulatory Visit: Payer: Self-pay | Admitting: Physical Medicine and Rehabilitation

## 2022-03-22 NOTE — Progress Notes (Unsigned)
Patient ID: Rhyker Meth, male    DOB: 1965/05/21  MRN: [id number]  CC: Follow-Up  Subjective: [name] is a 57 y.o. male who presents for follow-up.   His concerns today include:   Patient Active Problem List   Diagnosis Date Noted   Chronic diastolic CHF (congestive heart failure) (HCC) 11/07/2021   Left ventricular hypertrophy 11/07/2021   Osteoarthritis of left glenohumeral joint 05/04/2021   Colon cancer screening    Left hemiplegia (HCC) 07/02/2020   Cognitive deficit due to recent stroke 07/02/2020   Dysphagia, post-stroke    Vascular headache    Acute blood loss anemia    AKI (acute kidney injury) (HCC)    Essential hypertension    Seizures (HCC)    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   ICH (intracerebral hemorrhage) (HCC) 06/09/2020   Sprain of anterior talofibular ligament of left ankle 01/15/2020   Hyperlipidemia, on Lipitor 04/01/2017   Alcohol use disorder, mild, abuse 06/30/2016   Morbid obesity (HCC) 06/30/2016   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   Chronic combined systolic and diastolic CHF (congestive heart failure) (HCC) 11/08/2012   HTN (hypertension) 05/25/2012     Current Outpatient Medications on File Prior to Visit  Medication Sig Dispense Refill   amLODipine (NORVASC) 10 MG tablet Take 1 tablet by mouth once daily 90 tablet 0   atorvastatin (LIPITOR) 80 MG tablet Take 1 tablet (80 mg total) by mouth daily. 90 tablet 1   baclofen (LIORESAL) 10 MG tablet Take 1 tablet (10 mg total) by mouth at bedtime. 90 each 3   cloNIDine (CATAPRES) 0.1 MG tablet Take 1 tablet (0.1 mg total) by mouth 3 (three) times daily. 90 tablet 2   diclofenac Sodium (VOLTAREN) 1 % GEL Apply 2 g topically 4 (four) times daily. 200 g 0   DULoxetine (CYMBALTA) 20 MG capsule Take 1 capsule (20 mg total) by mouth daily. 90 capsule 3   folic acid (FOLVITE) 1 MG tablet Take 1 tablet by mouth once daily 30 tablet 3   gabapentin (NEURONTIN) 300 MG capsule Take  1 capsule (300 mg total) by mouth 3 (three) times daily. 270 capsule 3   labetalol (NORMODYNE) 100 MG tablet Take 1 tablet by mouth twice daily 180 tablet 1   levETIRAcetam (KEPPRA) 500 MG tablet Take 1 tablet (500 mg total) by mouth 2 (two) times daily. 180 tablet 3   losartan (COZAAR) 50 MG tablet Take 1 tablet by mouth twice daily 60 tablet 2   Multiple Vitamins-Minerals (CERTAVITE/ANTIOXIDANTS) TABS Take 1 tablet by mouth daily. 30 tablet 0   tadalafil (CIALIS) 5 MG tablet Take 5 mg by mouth every morning.     tamsulosin (FLOMAX) 0.4 MG CAPS capsule TAKE 1 CAPSULE BY MOUTH ONCE DAILY AFTER SUPPER 30 capsule 0   thiamine 100 MG tablet Take 1 tablet (100 mg total) by mouth daily. 30 tablet 0   tiZANidine (ZANAFLEX) 4 MG tablet Take 1 tablet (4 mg total) by mouth at bedtime as needed for muscle spasms. 30 tablet 0   topiramate (TOPAMAX) 50 MG tablet Take 1 tablet (50 mg total) by mouth 2 (two) times daily. 60 tablet 5   No current facility-administered medications on file prior to visit.    No Known Allergies  Social History   Socioeconomic History   Marital status: Married    Spouse name: Not on file   Number of children: Not on file   Years of education: Not on file   Highest  education level: Not on file  Occupational History   Not on file  Tobacco Use   Smoking status: Former    Types: Cigarettes    Passive exposure: Past   Smokeless tobacco: Never  Vaping Use   Vaping Use: Never used  Substance and Sexual Activity   Alcohol use: Not Currently   Drug use: Not Currently    Types: Cocaine, Marijuana, Oxycodone   Sexual activity: Yes  Other Topics Concern   Not on file  Social History Narrative   Not on file   Social Determinants of Health   Financial Resource Strain: Not on file  Food Insecurity: Not on file  Transportation Needs: Not on file  Physical Activity: Not on file  Stress: Not on file  Social Connections: Not on file  Intimate Partner Violence: Not on  file    Family History  Problem Relation Age of Onset   Hypertension Mother        Passed away when she is 42 secondary to hip surgery complications   CAD Mother    Heart attack Mother    Hypertension Father    Diabetes Brother    Stroke Brother    Heart attack Maternal Grandmother    Colon cancer Neg Hx    Esophageal cancer Neg Hx    Pancreatic cancer Neg Hx    Stomach cancer Neg Hx    Colon polyps Neg Hx     Past Surgical History:  Procedure Laterality Date   COLONOSCOPY WITH PROPOFOL N/A 12/09/2020   Procedure: COLONOSCOPY WITH PROPOFOL;  Surgeon: Benancio Deeds, MD;  Location: [facility];  Service: Gastroenterology;  Laterality: N/A;   KNEE ARTHROSCOPY Left 04/13/2016   Procedure: ARTHROSCOPY KNEE WITH DEBRIDEMENT;  Surgeon: Valeria Batman, MD;  Location: [facility];  Service: Orthopedics;  Laterality: Left;   KNEE ARTHROSCOPY WITH MEDIAL MENISECTOMY Right 04/13/2016   Procedure: KNEE ARTHROSCOPY WITH MEDIAL MENISECTOMY;  Surgeon: Valeria Batman, MD;  Location: [facility];  Service: Orthopedics;  Laterality: LEFT not right knee   NASAL FRACTURE SURGERY     SHOULDER SURGERY Left     ROS: Review of Systems Negative except as stated above  PHYSICAL EXAM: There were no vitals taken for this visit.  Physical Exam  {male adult master:310786} {male adult master:310785}     Latest Ref Rng & Units 11/23/2021   11:33 AM 05/11/2021    4:52 PM 09/29/2020    4:45 PM  CMP  Glucose 70 - 99 mg/dL 77  75  88   BUN 6 - 24 mg/dL 14  16  14    Creatinine 0.76 - 1.27 mg/dL 5.40  9.81  1.91   Sodium 134 - 144 mmol/L 144  144  143   Potassium 3.5 - 5.2 mmol/L 4.0  3.9  3.9   Chloride 96 - 106 mmol/L 106  111  107   CO2 20 - 29 mmol/L 24  19  20    Calcium 8.7 - 10.2 mg/dL 47.8  9.7  29.5   Total Protein 6.0 - 8.5 g/dL 7.3   7.7   Total Bilirubin 0.0 - 1.2 mg/dL 0.5   0.4   Alkaline Phos 44 - 121 IU/L 73   105   AST 0 - 40 IU/L 22   18    ALT 0 - 44 IU/L 24   22    Lipid Panel     Component Value Date/Time   CHOL 168 10/31/2021 1520  TRIG 132 10/31/2021 1520   HDL 59 10/31/2021 1520   CHOLHDL 2.8 10/31/2021 1520   CHOLHDL 3.8 06/09/2020 1809   VLDL 66 (H) 06/09/2020 1809   LDLCALC 86 10/31/2021 1520    CBC    Component Value Date/Time   WBC 4.7 11/23/2021 1133   WBC 6.0 07/19/2020 0530   RBC 4.58 11/23/2021 1133   RBC 3.61 (L) 07/19/2020 0530   HGB 13.9 11/23/2021 1133   HCT 42.0 11/23/2021 1133   PLT 314 11/23/2021 1133   MCV 92 11/23/2021 1133   MCH 30.3 11/23/2021 1133   MCH 31.6 07/19/2020 0530   MCHC 33.1 11/23/2021 1133   MCHC 33.4 07/19/2020 0530   RDW 11.2 (L) 11/23/2021 1133   LYMPHSABS 1.6 11/23/2021 1133   MONOABS 0.8 06/28/2020 1103   EOSABS 0.1 11/23/2021 1133   BASOSABS 0.0 11/23/2021 1133    ASSESSMENT AND PLAN:  There are no diagnoses linked to this encounter.   Patient was given the opportunity to ask questions.  Patient verbalized understanding of the plan and was able to repeat key elements of the plan. Patient was given clear instructions to go to Emergency Department or return to medical center if symptoms don't improve, worsen, or new problems develop.The patient verbalized understanding.   No orders of the defined types were placed in this encounter.    Requested Prescriptions    No prescriptions requested or ordered in this encounter    No follow-ups on file.  Rema Fendt, NP

## 2022-03-23 ENCOUNTER — Encounter: Payer: Self-pay | Admitting: Family

## 2022-03-24 DIAGNOSIS — R972 Elevated prostate specific antigen [PSA]: Secondary | ICD-10-CM | POA: Diagnosis not present

## 2022-03-24 DIAGNOSIS — R3912 Poor urinary stream: Secondary | ICD-10-CM | POA: Diagnosis not present

## 2022-03-24 DIAGNOSIS — N5201 Erectile dysfunction due to arterial insufficiency: Secondary | ICD-10-CM | POA: Diagnosis not present

## 2022-03-24 DIAGNOSIS — N401 Enlarged prostate with lower urinary tract symptoms: Secondary | ICD-10-CM | POA: Diagnosis not present

## 2022-03-28 ENCOUNTER — Other Ambulatory Visit: Payer: Self-pay | Admitting: Physical Medicine and Rehabilitation

## 2022-03-28 DIAGNOSIS — K629 Disease of anus and rectum, unspecified: Secondary | ICD-10-CM

## 2022-03-29 DIAGNOSIS — M19012 Primary osteoarthritis, left shoulder: Secondary | ICD-10-CM | POA: Diagnosis not present

## 2022-04-19 ENCOUNTER — Ambulatory Visit: Payer: BC Managed Care – PPO | Admitting: Gastroenterology

## 2022-04-20 ENCOUNTER — Other Ambulatory Visit: Payer: Self-pay | Admitting: Family

## 2022-04-20 DIAGNOSIS — I10 Essential (primary) hypertension: Secondary | ICD-10-CM

## 2022-04-20 NOTE — Telephone Encounter (Signed)
Requested Prescriptions  Pending Prescriptions Disp Refills   losartan (COZAAR) 50 MG tablet [Pharmacy Med Name: Losartan Potassium 50 MG Oral Tablet] 180 tablet 0    Sig: Take 1 tablet by mouth twice daily     Cardiovascular:  Angiotensin Receptor Blockers Passed - 04/20/2022  6:51 AM      Passed - Cr in normal range and within 180 days    Creatinine, Ser  Date Value Ref Range Status  11/23/2021 1.11 0.76 - 1.27 mg/dL Final         Passed - K in normal range and within 180 days    Potassium  Date Value Ref Range Status  11/23/2021 4.0 3.5 - 5.2 mmol/L Final         Passed - Patient is not pregnant      Passed - Last BP in normal range    BP Readings from Last 1 Encounters:  03/10/22 131/75         Passed - Valid encounter within last 6 months    Recent Outpatient Visits           1 month ago Housing insecurity   [place] [facility], [place], NP   4 months ago Annual physical exam   [place] [facility], MD   7 months ago Essential (primary) hypertension   [place] [facility], [place], NP   11 months ago Essential (primary) hypertension    [facility], [place], NP   1 year ago Encounter for completion of form with patient   [facility], [place]onnie Hailstone, NP

## 2022-05-02 ENCOUNTER — Ambulatory Visit: Payer: BC Managed Care – PPO | Admitting: Adult Health

## 2022-05-10 ENCOUNTER — Encounter: Payer: Self-pay | Admitting: *Deleted

## 2022-05-11 ENCOUNTER — Other Ambulatory Visit: Payer: Self-pay | Admitting: Family

## 2022-05-11 DIAGNOSIS — I10 Essential (primary) hypertension: Secondary | ICD-10-CM

## 2022-05-15 ENCOUNTER — Other Ambulatory Visit: Payer: Self-pay

## 2022-05-15 DIAGNOSIS — I10 Essential (primary) hypertension: Secondary | ICD-10-CM

## 2022-05-15 MED ORDER — AMLODIPINE BESYLATE 10 MG PO TABS: 10.0000 mg | ORAL_TABLET | Freq: Every day | ORAL | 0 refills | Status: DC

## 2022-05-31 ENCOUNTER — Ambulatory Visit (INDEPENDENT_AMBULATORY_CARE_PROVIDER_SITE_OTHER): Payer: Self-pay | Admitting: Physician Assistant

## 2022-05-31 ENCOUNTER — Encounter: Payer: Self-pay | Admitting: Physician Assistant

## 2022-05-31 VITALS — BP 126/68 | HR 87 | Ht 69.0 in | Wt 182.4 lb

## 2022-05-31 DIAGNOSIS — K6289 Other specified diseases of anus and rectum: Secondary | ICD-10-CM

## 2022-05-31 NOTE — Progress Notes (Signed)
Chief Complaint: "sour smell from my rectum"  HPI:    [name] is a 57 year old African-American male with a past medical history as listed below including CHF with decreased ejection fraction, seizures and previous stroke, known to Dr. Adela Lank, who was referred to me by Placey, Chales Abrahams, NP for a complaint of "sour smell from my rectum".    12/09/2020 colonoscopy with internal hemorrhoids and otherwise normal.  Repeat recommended 10 years.    Today, the patient tells me that ever since he had a stroke almost a year and a half ago now he has noticed a "sour smell" from his rectum.  He describes that initially after his stroke he was little bit constipated but now he is back to daily soft solid bowel movements, no fecal leakage, no increase in flatulence or eructations, no abdominal or rectal pain, no leakage of stool or any secretions in his underwear.  The smell is his only complaint.  Tells me he thought it was may be related to his ex-wife's cooking, but he has since moved and changed his diet and it still remains.  He tells me he uses wet wipes and tries to stay clean.    Denies fever, chills, weight loss, blood in his stool, nausea, vomiting or symptoms that awaken him from sleep  Past Medical History:  Diagnosis Date   Anxiety    Cataract    removed left eye   Chronic combined systolic and diastolic CHF (congestive heart failure) (HCC)    EF 25-30%   Diastolic dysfunction    Dyslipidemia    Hyperlipidemia    Hypertension    Neuromuscular disorder (HCC)    neuropathy left foot   Nonischemic dilated cardiomyopathy (HCC)    felt secondary to HTN with no ischemia on nuclear stress test 05/2012, EF 25-30%   Seizures (HCC)    last seizure 05-2020- on Keppra   Shortness of breath    Stroke (HCC) 05/2020    Past Surgical History:  Procedure Laterality Date   COLONOSCOPY WITH PROPOFOL N/A 12/09/2020   Procedure: COLONOSCOPY WITH PROPOFOL;  Surgeon: Benancio Deeds, MD;   Location: [facility];  Service: Gastroenterology;  Laterality: N/A;   KNEE ARTHROSCOPY Left 04/13/2016   Procedure: ARTHROSCOPY KNEE WITH DEBRIDEMENT;  Surgeon: Valeria Batman, MD;  Location: [name];  Service: Orthopedics;  Laterality: Left;   KNEE ARTHROSCOPY WITH MEDIAL MENISECTOMY Right 04/13/2016   Procedure: KNEE ARTHROSCOPY WITH MEDIAL MENISECTOMY;  Surgeon: Valeria Batman, MD;  Location: [facility];  Service: Orthopedics;  Laterality: LEFT not right knee   NASAL FRACTURE SURGERY     SHOULDER SURGERY Left     Current Outpatient Medications  Medication Sig Dispense Refill   amLODipine (NORVASC) 10 MG tablet Take 1 tablet (10 mg total) by mouth daily. 90 tablet 0   atorvastatin (LIPITOR) 80 MG tablet Take 1 tablet (80 mg total) by mouth daily. 90 tablet 1   baclofen (LIORESAL) 10 MG tablet Take 1 tablet (10 mg total) by mouth at bedtime. 90 each 3   cloNIDine (CATAPRES) 0.1 MG tablet Take 1 tablet (0.1 mg total) by mouth 3 (three) times daily. 90 tablet 2   diclofenac Sodium (VOLTAREN) 1 % GEL Apply 2 g topically 4 (four) times daily. 200 g 0   DULoxetine (CYMBALTA) 20 MG capsule Take 1 capsule (20 mg total) by mouth daily. 90 capsule 3   folic acid (FOLVITE) 1 MG tablet Take 1 tablet by  mouth once daily 30 tablet 3   gabapentin (NEURONTIN) 300 MG capsule Take 1 capsule (300 mg total) by mouth 3 (three) times daily. 270 capsule 3   labetalol (NORMODYNE) 100 MG tablet Take 1 tablet by mouth twice daily 180 tablet 1   levETIRAcetam (KEPPRA) 500 MG tablet Take 1 tablet (500 mg total) by mouth 2 (two) times daily. 180 tablet 3   losartan (COZAAR) 50 MG tablet Take 1 tablet by mouth twice daily 180 tablet 0   Multiple Vitamins-Minerals (CERTAVITE/ANTIOXIDANTS) TABS Take 1 tablet by mouth daily. 30 tablet 0   tadalafil (CIALIS) 5 MG tablet Take 5 mg by mouth every morning.     tamsulosin (FLOMAX) 0.4 MG CAPS capsule TAKE 1 CAPSULE BY MOUTH ONCE DAILY  AFTER SUPPER 30 capsule 0   thiamine 100 MG tablet Take 1 tablet (100 mg total) by mouth daily. 30 tablet 0   tiZANidine (ZANAFLEX) 4 MG tablet Take 1 tablet (4 mg total) by mouth at bedtime as needed for muscle spasms. 30 tablet 0   topiramate (TOPAMAX) 50 MG tablet Take 1 tablet (50 mg total) by mouth 2 (two) times daily. 60 tablet 5   No current facility-administered medications for this visit.    Allergies as of 05/31/2022   (No Known Allergies)    Family History  Problem Relation Age of Onset   Hypertension Mother        Passed away when she is 33 secondary to hip surgery complications   CAD Mother    Heart attack Mother    Hypertension Father    Diabetes Brother    Stroke Brother    Heart attack Maternal Grandmother    Colon cancer Neg Hx    Esophageal cancer Neg Hx    Pancreatic cancer Neg Hx    Stomach cancer Neg Hx    Colon polyps Neg Hx     Social History   Socioeconomic History   Marital status: Married    Spouse name: Not on file   Number of children: Not on file   Years of education: Not on file   Highest education level: Not on file  Occupational History   Not on file  Tobacco Use   Smoking status: Former    Types: Cigarettes    Passive exposure: Past   Smokeless tobacco: Never  Vaping Use   Vaping Use: Never used  Substance and Sexual Activity   Alcohol use: Not Currently   Drug use: Not Currently    Types: Cocaine, Marijuana, Oxycodone   Sexual activity: Yes  Other Topics Concern   Not on file  Social History Narrative   Not on file   Social Determinants of Health   Financial Resource Strain: Not on file  Food Insecurity: Not on file  Transportation Needs: Not on file  Physical Activity: Not on file  Stress: Not on file  Social Connections: Not on file  Intimate Partner Violence: Not on file    Review of Systems:    Constitutional: No weight loss, fever or chills Cardiovascular: No chest pain  Respiratory: No SOB   Gastrointestinal: See HPI and otherwise negative   Physical Exam:  Vital signs: BP 126/68   Pulse 87   Ht 5\' 9"  (1.753 m)   Wt 182 lb 6.4 oz (82.7 kg)   SpO2 96%   BMI 26.94 kg/m    Constitutional:   Pleasant AA male appears to be in NAD, Well developed, Well nourished, alert and cooperative Head:  Normocephalic and atraumatic. Eyes:   PEERL, EOMI. No icterus. Conjunctiva pink. Ears:  Normal auditory acuity. Neck:  Supple Throat: Oral cavity and pharynx without inflammation, swelling or lesion.  Respiratory: Respirations even and unlabored. Lungs clear to auscultation bilaterally.   No wheezes, crackles, or rhonchi.  Cardiovascular: Normal S1, S2. No MRG. Regular rate and rhythm. No peripheral edema, cyanosis or pallor.  Gastrointestinal:  Soft, nondistended, nontender. No rebound or guarding. Normal bowel sounds. No appreciable masses or hepatomegaly. Rectal:  External: some fecal matter; Internal: slightly decreased sphincter tone, no mass or ttp  Msk:  Symmetrical without gross deformities. Without edema, no deformity or joint abnormality.  Neurologic:  Alert and  oriented x4;  grossly normal neurologically. +residual L side weakness Skin:   Dry and intact without significant lesions or rashes. Psychiatric: Demonstrates good judgement and reason without abnormal affect or behaviors.  RELEVANT LABS AND IMAGING: CBC    Component Value Date/Time   WBC 4.7 11/23/2021 1133   WBC 6.0 07/19/2020 0530   RBC 4.58 11/23/2021 1133   RBC 3.61 (L) 07/19/2020 0530   HGB 13.9 11/23/2021 1133   HCT 42.0 11/23/2021 1133   PLT 314 11/23/2021 1133   MCV 92 11/23/2021 1133   MCH 30.3 11/23/2021 1133   MCH 31.6 07/19/2020 0530   MCHC 33.1 11/23/2021 1133   MCHC 33.4 07/19/2020 0530   RDW 11.2 (L) 11/23/2021 1133   LYMPHSABS 1.6 11/23/2021 1133   MONOABS 0.8 06/28/2020 1103   EOSABS 0.1 11/23/2021 1133   BASOSABS 0.0 11/23/2021 1133    CMP     Component Value Date/Time   NA 144  11/23/2021 1133   K 4.0 11/23/2021 1133   CL 106 11/23/2021 1133   CO2 24 11/23/2021 1133   GLUCOSE 77 11/23/2021 1133   GLUCOSE 109 (H) 07/19/2020 0530   BUN 14 11/23/2021 1133   CREATININE 1.11 11/23/2021 1133   CALCIUM 10.2 11/23/2021 1133   PROT 7.3 11/23/2021 1133   ALBUMIN 4.7 11/23/2021 1133   AST 22 11/23/2021 1133   ALT 24 11/23/2021 1133   ALKPHOS 73 11/23/2021 1133   BILITOT 0.5 11/23/2021 1133   GFRNONAA >60 07/19/2020 0530   GFRAA >60 01/02/2018 1657    Assessment: 1.  Decrease sphincter tone: Discussed with patient that this could be adding to the smell he is describing, this could have resulted from his stroke  Plan: 1.  Recommend the patient start a fiber supplement such as Metamucil, Citrucel or Benefiber once daily 2.  Discussed pelvic floor PT but patient declines 3.  Recommend the patient buy [place] wet wipes and dab dry and try to stay clean. 4.  Patient to follow in clinic with [place] as needed.  Hyacinth Meeker, PA-C [facility] 05/31/2022, 10:14 AM  Cc: Dalton Sharps, NP

## 2022-05-31 NOTE — Patient Instructions (Addendum)
Please purchase the following medications over the counter and take as directed: Benefiber, Metamucil, Citrucel  Follow up as needed    If your blood pressure at your visit was 140/90 or greater, please contact your primary care physician to follow up on this.  _______________________________________________________  If you are age 59 or older, your body mass index should be between 23-30. Your Body mass index is 26.94 kg/m. If this is out of the aforementioned range listed, please consider follow up with your Primary Care Provider.  If you are age 61 or younger, your body mass index should be between 19-25. Your Body mass index is 26.94 kg/m. If this is out of the aformentioned range listed, please consider follow up with your Primary Care Provider.   ________________________________________________________  The [facility] providers would like to encourage you to use [facility] to communicate with providers for non-urgent requests or questions.  Due to long hold times on the telephone, sending your provider a message by [facility] may be a faster and more efficient way to get a response.  Please allow 48 business hours for a response.  Please remember that this is for non-urgent requests.  _______________________________________________________    Thank you for entrusting me with your care and choosing [facility].  Hyacinth Meeker PA-C

## 2022-05-31 NOTE — Progress Notes (Signed)
Agree with assessment and plan as outlined.  

## 2022-06-09 ENCOUNTER — Encounter: Payer: Self-pay | Admitting: Physical Medicine and Rehabilitation

## 2022-06-10 ENCOUNTER — Encounter: Payer: Self-pay | Admitting: Cardiovascular Disease

## 2022-06-10 NOTE — Progress Notes (Signed)
Cardiology Office Note:    Date:  06/10/2022   ID:  [name], DOB 02/19/1965, MRN [id number]  PCP:  Lavinia Sharps, NP   [facility] Providers Cardiologist:  previously Mayford Knife / Avah Bashor      Referring MD: Lavinia Sharps, NP   Chief Complaint  Patient presents with   Hypertension     History of Present Illness:    [name] is a 57 y.o. male with a hx of  HTN.   HLD.  , CHF , hx of stroke ( March , 2021)   No cp or dyspnea ,  walks in the house  Could walk a mile if needed.   Works out  Has had some issues with medication compliance  Left sided weakness from his stroke  Going to restart PT soon   He has been much more compliant with his medications recently.  He says active as he can be considering his stroke.  His diet is improved.   Jun 12, 2022 [name] is seen for follow up of his stroke , HTN, CHF, HLD,      Past Medical History:  Diagnosis Date   Anxiety    Cataract    removed left eye   Chronic combined systolic and diastolic CHF (congestive heart failure) (HCC)    EF 25-30%   Diastolic dysfunction    Dyslipidemia    Hyperlipidemia    Hypertension    Neuromuscular disorder (HCC)    neuropathy left foot   Nonischemic dilated cardiomyopathy (HCC)    felt secondary to HTN with no ischemia on nuclear stress test 05/2012, EF 25-30%   Seizures (HCC)    last seizure 05-2020- on Keppra   Shortness of breath    Stroke (HCC) 05/2020    Past Surgical History:  Procedure Laterality Date   COLONOSCOPY WITH PROPOFOL N/A 12/09/2020   Procedure: COLONOSCOPY WITH PROPOFOL;  Surgeon: Benancio Deeds, MD;  Location: [facility];  Service: Gastroenterology;  Laterality: N/A;   KNEE ARTHROSCOPY Left 04/13/2016   Procedure: ARTHROSCOPY KNEE WITH DEBRIDEMENT;  Surgeon: Valeria Batman, MD;  Location: [facility];  Service: Orthopedics;  Laterality: Left;   KNEE ARTHROSCOPY WITH MEDIAL MENISECTOMY Right 04/13/2016   Procedure:  KNEE ARTHROSCOPY WITH MEDIAL MENISECTOMY;  Surgeon: Valeria Batman, MD;  Location: [facility];  Service: Orthopedics;  Laterality: LEFT not right knee   NASAL FRACTURE SURGERY     SHOULDER SURGERY Left     Current Medications: No outpatient medications have been marked as taking for the 06/12/22 encounter (Office Visit) with Ajna Moors, Deloris Ping, MD.     Allergies:   Patient has no known allergies.   Social History   Socioeconomic History   Marital status: Married    Spouse name: Not on file   Number of children: Not on file   Years of education: Not on file   Highest education level: Not on file  Occupational History   Not on file  Tobacco Use   Smoking status: Former    Types: Cigarettes    Passive exposure: Past   Smokeless tobacco: Never  Vaping Use   Vaping Use: Never used  Substance and Sexual Activity   Alcohol use: Not Currently   Drug use: Not Currently    Types: Cocaine, Marijuana, Oxycodone   Sexual activity: Yes  Other Topics Concern   Not on file  Social History Narrative   Not on file   Social Determinants  of Health   Financial Resource Strain: Not on file  Food Insecurity: Not on file  Transportation Needs: Not on file  Physical Activity: Not on file  Stress: Not on file  Social Connections: Not on file     Family History: The patient's family history includes CAD in his mother; Diabetes in his brother; Heart attack in his maternal grandmother and mother; Hypertension in his father and mother; Stroke in his brother. There is no history of Colon cancer, Esophageal cancer, Pancreatic cancer, Stomach cancer, or Colon polyps.  ROS:   Please see the history of present illness.     All other systems reviewed and are negative.  EKGs/Labs/Other Studies Reviewed:    The following studies were reviewed today:   EKG:    Recent Labs: 11/23/2021: ALT 24; BUN 14; Creatinine, Ser 1.11; Hemoglobin 13.9; Platelets 314; Potassium 4.0; Sodium  144; TSH 0.824  Recent Lipid Panel    Component Value Date/Time   CHOL 168 10/31/2021 1520   TRIG 132 10/31/2021 1520   HDL 59 10/31/2021 1520   CHOLHDL 2.8 10/31/2021 1520   CHOLHDL 3.8 06/09/2020 1809   VLDL 66 (H) 06/09/2020 1809   LDLCALC 86 10/31/2021 1520     Risk Assessment/Calculations:            Physical Exam:    Physical Exam: There were no vitals taken for this visit.  No BP recorded.  {Refresh Note OR Click here to enter BP  :1}***    GEN:  Well nourished, well developed in no acute distress HEENT: Normal NECK: No JVD; No carotid bruits LYMPHATICS: No lymphadenopathy CARDIAC: RRR ***, no murmurs, rubs, gallops RESPIRATORY:  Clear to auscultation without rales, wheezing or rhonchi  ABDOMEN: Soft, non-tender, non-distended MUSCULOSKELETAL:  No edema; No deformity  SKIN: Warm and dry NEUROLOGIC:  Alert and oriented x 3   ASSESSMENT:    No diagnosis found.  PLAN:       History of congestive heart failure.      2.  Stroke:    .   3.. HTN:              Medication Adjustments/Labs and Tests Ordered: Current medicines are reviewed at length with the patient today.  Concerns regarding medicines are outlined above.  No orders of the defined types were placed in this encounter.  No orders of the defined types were placed in this encounter.   There are no Patient Instructions on file for this visit.   Signed, Kristeen Miss, MD  06/10/2022 9:09 PM    [facility]

## 2022-06-12 ENCOUNTER — Ambulatory Visit: Payer: Self-pay | Admitting: Cardiovascular Disease

## 2022-06-13 ENCOUNTER — Encounter: Payer: Self-pay | Attending: Physical Medicine and Rehabilitation | Admitting: Physical Medicine and Rehabilitation

## 2022-06-13 VITALS — BP 110/73 | HR 79 | Ht 69.0 in | Wt 186.0 lb

## 2022-06-13 DIAGNOSIS — R29898 Other symptoms and signs involving the musculoskeletal system: Secondary | ICD-10-CM | POA: Insufficient documentation

## 2022-06-13 DIAGNOSIS — I10 Essential (primary) hypertension: Secondary | ICD-10-CM | POA: Insufficient documentation

## 2022-06-13 DIAGNOSIS — G629 Polyneuropathy, unspecified: Secondary | ICD-10-CM | POA: Insufficient documentation

## 2022-06-13 DIAGNOSIS — E663 Overweight: Secondary | ICD-10-CM | POA: Insufficient documentation

## 2022-06-13 MED ORDER — AMLODIPINE BESYLATE 5 MG PO TABS: 5.0000 mg | ORAL_TABLET | Freq: Every day | ORAL | 11 refills | Status: DC

## 2022-06-13 MED ORDER — GABAPENTIN 300 MG PO CAPS: 300.0000 mg | ORAL_CAPSULE | Freq: Three times a day (TID) | ORAL | 3 refills | Status: DC
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
## 2022-06-14 NOTE — Progress Notes (Unsigned)
Office Visit    Patient Name: [name] Date of Encounter: 06/15/2022  PCP:  Dalton Sharps, NP   [facility]  Cardiologist:  Dalton Miss, MD  Advanced Practice Provider:  No care team member to display Electrophysiologist:  None   HPI    [name] is a 57 y.o. male with a past medical history of hypertension, hyperlipidemia, CHF, stroke March 2021 presents today for follow-up appointment.  He was last seen October 2023 and at that time did not have any chest pain or dyspnea.  Was walking in the house but could also walk a mile if needed.  He was working out and had some issues with medication compliance.   he had some left-sided weakness from his stroke.  Was planning to restart physical therapy.  Much more compliant with medications recently.  States that he was active as he could be considering a stroke.  Diet and improved.  Today, he tells me that he has some shortness of breath at times.  He walks 3-4 times a day at the park.  Usually he is doing PT/OT however his insurance has switched.  He is going through a separation with his ex-wife.  No real swelling in his legs currently.  Used to have some swelling but has improved.  When he overexerts himself he sometimes has chest pain that is relieved with rest.  We discussed possibly adding nitro to his medications however, he is on Cialis daily (likely for BPH, not prescribed by [place]).  He was recently started on gabapentin for his neuropathy and he says it works well for him.  We discussed updating an echocardiogram  No edema, orthopnea, PND. Reports no palpitations.    Past Medical History    Past Medical History:  Diagnosis Date   Anxiety    Cataract    removed left eye   Chronic combined systolic and diastolic CHF (congestive heart failure) (HCC)    EF 25-30%   Diastolic dysfunction    Dyslipidemia    Hyperlipidemia    Hypertension    Neuromuscular disorder (HCC)    neuropathy left foot    Nonischemic dilated cardiomyopathy (HCC)    felt secondary to HTN with no ischemia on nuclear stress test 05/2012, EF 25-30%   Seizures (HCC)    last seizure 05-2020- on Keppra   Shortness of breath    Stroke (HCC) 05/2020   Past Surgical History:  Procedure Laterality Date   COLONOSCOPY WITH PROPOFOL N/A 12/09/2020   Procedure: COLONOSCOPY WITH PROPOFOL;  Surgeon: Benancio Deeds, MD;  Location: [facility];  Service: Gastroenterology;  Laterality: N/A;   KNEE ARTHROSCOPY Left 04/13/2016   Procedure: ARTHROSCOPY KNEE WITH DEBRIDEMENT;  Surgeon: Valeria Batman, MD;  Location: [facility];  Service: Orthopedics;  Laterality: Left;   KNEE ARTHROSCOPY WITH MEDIAL MENISECTOMY Right 04/13/2016   Procedure: KNEE ARTHROSCOPY WITH MEDIAL MENISECTOMY;  Surgeon: Valeria Batman, MD;  Location: [facility];  Service: Orthopedics;  Laterality: LEFT not right knee   NASAL FRACTURE SURGERY     SHOULDER SURGERY Left     Allergies  No Known Allergies  EKGs/Labs/Other Studies Reviewed:   The following studies were reviewed today: Cardiac Studies & Procedures     STRESS TESTS  MYOCARDIAL PERFUSION IMAGING 08/10/2014  Narrative  Nuclear stress EF: 51%.  The left ventricular ejection fraction is mildly decreased (45-54%).  There was no ST segment deviation noted during stress.  T wave inversion was noted during stress in the II, [name], aVF, V5 and V6 leads. T wave inversion persisted.  The study is normal.  This is a low risk study.   ECHOCARDIOGRAM  ECHOCARDIOGRAM COMPLETE 06/10/2020  Narrative ECHOCARDIOGRAM REPORT    Patient Name:   [name] Date of Exam: 06/10/2020 Medical Rec #:  [id number]      Height:       70.0 in Accession #:    [id number]     Weight:       251.1 lb Date of Birth:  1965/06/13      BSA:          2.299 m Patient Age:    54 years       BP:           140/71 mmHg Patient Gender: M              HR:           102  bpm. Exam Location:  Inpatient  Procedure: 2D Echo, Color Doppler and Cardiac Doppler  Indications:    CVA  History:        Patient has prior history of Echocardiogram examinations, most recent 08/12/2014. Cardiomyopathy and CHF, Signs/Symptoms:Shortness of Breath; Risk Factors:Hypertension, Dyslipidemia and Diabetes.  Sonographer:    Neomia Dear RDCS Referring Phys: [id number] [name]   Sonographer Comments: Patient is morbidly obese and Technically difficult study due to poor echo windows. IMPRESSIONS   1. Left ventricular ejection fraction, by estimation, is 65 to 70%. The left ventricle has normal function. The left ventricle has no regional wall motion abnormalities. There is severe left ventricular hypertrophy. Indeterminate diastolic filling due to E-A fusion. 2. Right ventricular systolic function is normal. The right ventricular size is normal. There is normal pulmonary artery systolic pressure. The estimated right ventricular systolic pressure is 28.4 mmHg. 3. The mitral valve is normal in structure. Trivial mitral valve regurgitation. No evidence of mitral stenosis. 4. The aortic valve is normal in structure. Aortic valve regurgitation is trivial. No aortic stenosis is present. 5. The inferior vena cava is normal in size with greater than 50% respiratory variability, suggesting right atrial pressure of 3 mmHg. 6. Increased flow velocities may be secondary to anemia, thyrotoxicosis, hyperdynamic or high flow state.  Conclusion(s)/Recommendation(s): No intracardiac source of embolism detected on this transthoracic study. A transesophageal echocardiogram is recommended to exclude cardiac source of embolism if clinically indicated.  FINDINGS Left Ventricle: Left ventricular ejection fraction, by estimation, is 65 to 70%. The left ventricle has normal function. The left ventricle has no regional wall motion abnormalities. The left ventricular internal cavity size was normal in  size. There is severe left ventricular hypertrophy. Indeterminate diastolic filling due to E-A fusion.  Right Ventricle: The right ventricular size is normal. No increase in right ventricular wall thickness. Right ventricular systolic function is normal. There is normal pulmonary artery systolic pressure. The tricuspid regurgitant velocity is 2.52 m/s, and with an assumed right atrial pressure of 3 mmHg, the estimated right ventricular systolic pressure is 28.4 mmHg.  Left Atrium: Left atrial size was normal in size.  Right Atrium: Right atrial size was normal in size.  Pericardium: There is no evidence of pericardial effusion.  Mitral Valve: The mitral valve is normal in structure. Trivial mitral valve regurgitation. No evidence of mitral valve stenosis. MV peak gradient, 13.0 mmHg. The mean mitral valve gradient is 4.0 mmHg.  Tricuspid Valve: The tricuspid valve is normal in  structure. Tricuspid valve regurgitation is trivial. No evidence of tricuspid stenosis.  Aortic Valve: The aortic valve is normal in structure. Aortic valve regurgitation is trivial. No aortic stenosis is present. Aortic valve mean gradient measures 7.0 mmHg. Aortic valve peak gradient measures 13.8 mmHg. Aortic valve area, by VTI measures 3.77 cm.  Pulmonic Valve: The pulmonic valve was not well visualized. Pulmonic valve regurgitation is trivial. No evidence of pulmonic stenosis.  Aorta: The aortic root is normal in size and structure.  Venous: The inferior vena cava is normal in size with greater than 50% respiratory variability, suggesting right atrial pressure of 3 mmHg.  IAS/Shunts: No atrial level shunt detected by color flow Doppler.   LEFT VENTRICLE PLAX 2D LVIDd:         4.00 cm     Diastology LVIDs:         2.00 cm     LV e' medial:    4.57 cm/s LV PW:         1.70 cm     LV E/e' medial:  16.3 LV IVS:        1.60 cm     LV e' lateral:   7.83 cm/s LVOT diam:     2.30 cm     LV E/e' lateral: 9.5 LV  SV:         106 LV SV Index:   46 LVOT Area:     4.15 cm  LV Volumes (MOD) LV vol d, MOD A2C: 78.9 ml LV vol d, MOD A4C: 93.0 ml LV vol s, MOD A2C: 23.9 ml LV vol s, MOD A4C: 22.9 ml LV SV MOD A2C:     55.0 ml LV SV MOD A4C:     93.0 ml LV SV MOD BP:      63.6 ml  RIGHT VENTRICLE RV Basal diam:  3.70 cm RV Mid diam:    2.90 cm RV S prime:     25.20 cm/s TAPSE (M-mode): 3.7 cm  LEFT ATRIUM             Index       RIGHT ATRIUM           Index LA diam:        3.20 cm 1.39 cm/m  RA Area:     13.30 cm LA Vol (A2C):   65.6 ml 28.53 ml/m RA Volume:   31.10 ml  13.53 ml/m LA Vol (A4C):   59.8 ml 26.01 ml/m LA Biplane Vol: 63.6 ml 27.66 ml/m AORTIC VALVE                    PULMONIC VALVE AV Area (Vmax):    3.19 cm     PV Vmax:       1.64 m/s AV Area (Vmean):   3.23 cm     PV Vmean:      113.000 cm/s AV Area (VTI):     3.77 cm     PV VTI:        0.256 m AV Vmax:           186.00 cm/s  PV Peak grad:  10.8 mmHg AV Vmean:          124.000 cm/s PV Mean grad:  6.0 mmHg AV VTI:            0.282 m AV Peak Grad:      13.8 mmHg AV Mean Grad:      7.0 mmHg LVOT Vmax:  143.00 cm/s LVOT Vmean:        96.300 cm/s LVOT VTI:          0.256 m LVOT/AV VTI ratio: 0.91  AORTA Ao Root diam: 3.20 cm Ao Asc diam:  3.20 cm Ao Arch diam: 3.2 cm  MITRAL VALVE                TRICUSPID VALVE MV Area (PHT): 4.63 cm     TR Peak grad:   25.4 mmHg MV Area VTI:   3.39 cm     TR Vmax:        252.00 cm/s MV Peak grad:  13.0 mmHg MV Mean grad:  4.0 mmHg     SHUNTS MV Vmax:       1.80 m/s     Systemic VTI:  0.26 m MV Vmean:      94.4 cm/s    Systemic Diam: 2.30 cm MV Decel Time: 164 msec MV E velocity: 74.30 cm/s MV A velocity: 106.00 cm/s MV E/A ratio:  0.70  Weston Brass MD Electronically signed by Weston Brass MD Signature Date/Time: 06/10/2020/11:44:08 AM    Final              EKG:  EKG is not ordered today.   Recent Labs: 11/23/2021: ALT 24; BUN 14; Creatinine, Ser  1.11; Hemoglobin 13.9; Platelets 314; Potassium 4.0; Sodium 144; TSH 0.824  Recent Lipid Panel    Component Value Date/Time   CHOL 168 10/31/2021 1520   TRIG 132 10/31/2021 1520   HDL 59 10/31/2021 1520   CHOLHDL 2.8 10/31/2021 1520   CHOLHDL 3.8 06/09/2020 1809   VLDL 66 (H) 06/09/2020 1809   LDLCALC 86 10/31/2021 1520     Home Medications   Current Meds  Medication Sig   amLODipine (NORVASC) 5 MG tablet Take 1 tablet (5 mg total) by mouth daily.   atorvastatin (LIPITOR) 80 MG tablet Take 1 tablet (80 mg total) by mouth daily.   baclofen (LIORESAL) 10 MG tablet Take 1 tablet (10 mg total) by mouth at bedtime.   diclofenac Sodium (VOLTAREN) 1 % GEL Apply 2 g topically 4 (four) times daily.   DULoxetine (CYMBALTA) 20 MG capsule Take 1 capsule (20 mg total) by mouth daily.   folic acid (FOLVITE) 1 MG tablet Take 1 tablet by mouth once daily   gabapentin (NEURONTIN) 300 MG capsule Take 1 capsule (300 mg total) by mouth 3 (three) times daily.   labetalol (NORMODYNE) 100 MG tablet Take 1 tablet by mouth twice daily   levETIRAcetam (KEPPRA) 500 MG tablet Take 1 tablet (500 mg total) by mouth 2 (two) times daily.   losartan (COZAAR) 50 MG tablet Take 1 tablet by mouth twice daily   Multiple Vitamins-Minerals (CERTAVITE/ANTIOXIDANTS) TABS Take 1 tablet by mouth daily.   tadalafil (CIALIS) 5 MG tablet Take 5 mg by mouth every morning.   tamsulosin (FLOMAX) 0.4 MG CAPS capsule TAKE 1 CAPSULE BY MOUTH ONCE DAILY AFTER SUPPER   thiamine 100 MG tablet Take 1 tablet (100 mg total) by mouth daily.   tiZANidine (ZANAFLEX) 4 MG tablet Take 1 tablet (4 mg total) by mouth at bedtime as needed for muscle spasms.   topiramate (TOPAMAX) 50 MG tablet Take 1 tablet (50 mg total) by mouth 2 (two) times daily.     Review of Systems      All other systems reviewed and are otherwise negative except as noted above.  Physical Exam    VS:  BP 120/78  Pulse 95   Ht 5' 9.5" (1.765 m)   Wt 185 lb (83.9  kg)   SpO2 96%   BMI 26.93 kg/m  , BMI Body mass index is 26.93 kg/m.  Wt Readings from Last 3 Encounters:  06/15/22 185 lb (83.9 kg)  06/13/22 186 lb (84.4 kg)  05/31/22 182 lb 6.4 oz (82.7 kg)     GEN: Well nourished, well developed, in no acute distress. HEENT: normal. Neck: Supple, no JVD, carotid bruits, or masses. Cardiac: RRR, no murmurs, rubs, or gallops. No clubbing, cyanosis, edema.  Radials/PT 2+ and equal bilaterally.  Respiratory:  Respirations regular and unlabored, clear to auscultation bilaterally. GI: Soft, nontender, nondistended. MS: No deformity or atrophy. Skin: Warm and dry, no rash. Neuro:  Strength and sensation are intact. Psych: Normal affect.  Assessment & Plan    Essential hypertension -no medication changes today, well controlled -Continue amlodipine 5 mg daily, Lipitor 80 mg daily, clonidine 0.1 mg 3 times a day, labetalol 100 mg twice a day, losartan 50 mg twice daily  Chronic diastolic CHF -euvolemic on exam -update echo -reviewed labs  Left ventricular hypertrophy -BP well controlled today, continue current medications -continue to monitor at home -update echo   Separation -she was messing with medications and keeping him on sedatives and messing with his food -dangerous situation, discuss filing police report and a restraining order  -he is working with the [place] for temporary housing        Disposition: Follow up 6 months with Dalton Miss, MD or APP.  Signed, Sharlene Dory, PA-C 06/15/2022, 10:10 AM [facility]

## 2022-06-15 ENCOUNTER — Ambulatory Visit: Payer: Self-pay | Attending: Cardiovascular Disease | Admitting: Physician Assistant

## 2022-06-15 ENCOUNTER — Encounter: Payer: Self-pay | Admitting: Physician Assistant

## 2022-06-15 VITALS — BP 120/78 | HR 95 | Ht 69.5 in | Wt 185.0 lb

## 2022-06-15 DIAGNOSIS — I10 Essential (primary) hypertension: Secondary | ICD-10-CM

## 2022-06-15 DIAGNOSIS — R079 Chest pain, unspecified: Secondary | ICD-10-CM

## 2022-06-15 DIAGNOSIS — I517 Cardiomegaly: Secondary | ICD-10-CM

## 2022-06-15 DIAGNOSIS — R0609 Other forms of dyspnea: Secondary | ICD-10-CM

## 2022-06-15 DIAGNOSIS — I5032 Chronic diastolic (congestive) heart failure: Secondary | ICD-10-CM

## 2022-06-15 NOTE — Patient Instructions (Signed)
Medication Instructions:  Your physician recommends that you continue on your current medications as directed. Please refer to the Current Medication list given to you today.  *If you need a refill on your cardiac medications before your next appointment, please call your pharmacy*  Lab Work: Fasting lipids and lft's in October 2024 If you have labs (blood work) drawn today and your tests are completely normal, you will receive your results only by: MyChart Message (if you have MyChart) OR A paper copy in the mail If you have any lab test that is abnormal or we need to change your treatment, we will call you to review the results.  Testing/Procedures: Your physician has requested that you have an echocardiogram. Echocardiography is a painless test that uses sound waves to create images of your heart. It provides your doctor with information about the size and shape of your heart and how well your heart's chambers and valves are working. This procedure takes approximately one hour. There are no restrictions for this procedure. Please do NOT wear cologne, perfume, aftershave, or lotions (deodorant is allowed). Please arrive 15 minutes prior to your appointment time.   Follow-Up: At [facility], you and your health needs are our priority.  As part of our continuing mission to provide you with exceptional heart care, we have created designated Provider Care Teams.  These Care Teams include your primary Cardiologist (physician) and Advanced Practice Providers (APPs -  Physician Assistants and Nurse Practitioners) who all work together to provide you with the care you need, when you need it.  We recommend signing up for the patient portal called "MyChart".  Sign up information is provided on this After Visit Summary.  MyChart is used to connect with patients for Virtual Visits (Telemedicine).  Patients are able to view lab/test results, encounter notes, upcoming appointments, etc.  Non-urgent  messages can be sent to your provider as well.   To learn more about what you can do with MyChart, go to [email or web address].    Your next appointment:   6 month(s)  Provider:   Kristeen Miss, MD    Other Instructions Check your blood pressure daily, 1 hr after morning medications for 2 weeks, keep a log and send [place] the readings through mychart at the end of the 2 weeks.   Low-Sodium Eating Plan Sodium, which is an element that makes up salt, helps you maintain a healthy balance of fluids in your body. Too much sodium can increase your blood pressure and cause fluid and waste to be held in your body. Your health care provider or dietitian may recommend following this plan if you have high blood pressure (hypertension), kidney disease, liver disease, or heart failure. Eating less sodium can help lower your blood pressure, reduce swelling, and protect your heart, liver, and kidneys. What are tips for following this plan? Reading food labels The Nutrition Facts label lists the amount of sodium in one serving of the food. If you eat more than one serving, you must multiply the listed amount of sodium by the number of servings. Choose foods with less than 140 mg of sodium per serving. Avoid foods with 300 mg of sodium or more per serving. Shopping  Look for lower-sodium products, often labeled as "low-sodium" or "no salt added." Always check the sodium content, even if foods are labeled as "unsalted" or "no salt added." Buy fresh foods. Avoid canned foods and pre-made or frozen meals. Avoid canned, cured, or processed meats. Buy breads that have  less than 80 mg of sodium per slice. Cooking  Eat more home-cooked food and less restaurant, buffet, and fast food. Avoid adding salt when cooking. Use salt-free seasonings or herbs instead of table salt or sea salt. Check with your health care provider or pharmacist before using salt substitutes. Cook with plant-based oils, such as canola,  sunflower, or olive oil. Meal planning When eating at a restaurant, ask that your food be prepared with less salt or no salt, if possible. Avoid dishes labeled as brined, pickled, cured, smoked, or made with soy sauce, miso, or teriyaki sauce. Avoid foods that contain MSG (monosodium glutamate). MSG is sometimes added to [place] food, bouillon, and some canned foods. Make meals that can be grilled, baked, poached, roasted, or steamed. These are generally made with less sodium. General information Most people on this plan should limit their sodium intake to 1,500-2,000 mg (milligrams) of sodium each day. What foods should I eat? Fruits Fresh, frozen, or canned fruit. Fruit juice. Vegetables Fresh or frozen vegetables. "No salt added" canned vegetables. "No salt added" tomato sauce and paste. Low-sodium or reduced-sodium tomato and vegetable juice. Grains Low-sodium cereals, including oats, puffed wheat and rice, and shredded wheat. Low-sodium crackers. Unsalted rice. Unsalted pasta. Low-sodium bread. Whole-grain breads and whole-grain pasta. Meats and other proteins Fresh or frozen (no salt added) meat, poultry, seafood, and fish. Low-sodium canned tuna and salmon. Unsalted nuts. Dried peas, beans, and lentils without added salt. Unsalted canned beans. Eggs. Unsalted nut butters. Dairy Milk. Soy milk. Cheese that is naturally low in sodium, such as ricotta cheese, fresh mozzarella, or Swiss cheese. Low-sodium or reduced-sodium cheese. Cream cheese. Yogurt. Seasonings and condiments Fresh and dried herbs and spices. Salt-free seasonings. Low-sodium mustard and ketchup. Sodium-free salad dressing. Sodium-free light mayonnaise. Fresh or refrigerated horseradish. Lemon juice. Vinegar. Other foods Homemade, reduced-sodium, or low-sodium soups. Unsalted popcorn and pretzels. Low-salt or salt-free chips. The items listed above may not be a complete list of foods and beverages you can eat. Contact a  dietitian for more information. What foods should I avoid? Vegetables Sauerkraut, pickled vegetables, and relishes. Olives. [place] fries. Onion rings. Regular canned vegetables (not low-sodium or reduced-sodium). Regular canned tomato sauce and paste (not low-sodium or reduced-sodium). Regular tomato and vegetable juice (not low-sodium or reduced-sodium). Frozen vegetables in sauces. Grains Instant hot cereals. Bread stuffing, pancake, and biscuit mixes. Croutons. Seasoned rice or pasta mixes. Noodle soup cups. Boxed or frozen macaroni and cheese. Regular salted crackers. Self-rising flour. Meats and other proteins Meat or fish that is salted, canned, smoked, spiced, or pickled. Precooked or cured meat, such as sausages or meat loaves. Tomasa Blase. Ham. Pepperoni. Hot dogs. Corned beef. Chipped beef. Salt pork. Jerky. Pickled herring. Anchovies and sardines. Regular canned tuna. Salted nuts. Dairy Processed cheese and cheese spreads. Hard cheeses. Cheese curds. Blue cheese. Feta cheese. String cheese. Regular cottage cheese. Buttermilk. Canned milk. Fats and oils Salted butter. Regular margarine. Ghee. Bacon fat. Seasonings and condiments Onion salt, garlic salt, seasoned salt, table salt, and sea salt. Canned and packaged gravies. Worcestershire sauce. Tartar sauce. Barbecue sauce. Teriyaki sauce. Soy sauce, including reduced-sodium. Steak sauce. Fish sauce. Oyster sauce. Cocktail sauce. Horseradish that you find on the shelf. Regular ketchup and mustard. Meat flavorings and tenderizers. Bouillon cubes. Hot sauce. Pre-made or packaged marinades. Pre-made or packaged taco seasonings. Relishes. Regular salad dressings. Salsa. Other foods Salted popcorn and pretzels. Corn chips and puffs. Potato and tortilla chips. Canned or dried soups. Pizza. Frozen entrees and pot pies.  The items listed above may not be a complete list of foods and beverages you should avoid. Contact a dietitian for more  information. Summary Eating less sodium can help lower your blood pressure, reduce swelling, and protect your heart, liver, and kidneys. Most people on this plan should limit their sodium intake to 1,500-2,000 mg (milligrams) of sodium each day. Canned, boxed, and frozen foods are high in sodium. Restaurant foods, fast foods, and pizza are also very high in sodium. You also get sodium by adding salt to food. Try to cook at home, eat more fresh fruits and vegetables, and eat less fast food and canned, processed, or prepared foods. This information is not intended to replace advice given to you by your health care provider. Make sure you discuss any questions you have with your health care provider. Document Revised: 12/16/2018 Document Reviewed: 12/11/2018 Elsevier Patient Education  2023 [employer]   Heart-Healthy Eating Plan Many factors influence your heart health, including eating and exercise habits. Heart health is also called coronary health. Coronary risk increases with abnormal blood fat (lipid) levels. A heart-healthy eating plan includes limiting unhealthy fats, increasing healthy fats, limiting salt (sodium) intake, and making other diet and lifestyle changes. What is my plan? Your health care provider may recommend that: You limit your fat intake to _________% or less of your total calories each day. You limit your saturated fat intake to _________% or less of your total calories each day. You limit the amount of cholesterol in your diet to less than _________ mg per day. You limit the amount of sodium in your diet to less than _________ mg per day. What are tips for following this plan? Cooking Cook foods using methods other than frying. Baking, boiling, grilling, and broiling are all good options. Other ways to reduce fat include: Removing the skin from poultry. Removing all visible fats from meats. Steaming vegetables in water or broth. Meal planning  At meals, imagine  dividing your plate into fourths: Fill one-half of your plate with vegetables and green salads. Fill one-fourth of your plate with whole grains. Fill one-fourth of your plate with lean protein foods. Eat 2-4 cups of vegetables per day. One cup of vegetables equals 1 cup (91 g) broccoli or cauliflower florets, 2 medium carrots, 1 large bell pepper, 1 large sweet potato, 1 large tomato, 1 medium white potato, 2 cups (150 g) raw leafy greens. Eat 1-2 cups of fruit per day. One cup of fruit equals 1 small apple, 1 large banana, 1 cup (237 g) mixed fruit, 1 large orange,  cup (82 g) dried fruit, 1 cup (240 mL) 100% fruit juice. Eat more foods that contain soluble fiber. Examples include apples, broccoli, carrots, beans, peas, and barley. Aim to get 25-30 g of fiber per day. Increase your consumption of legumes, nuts, and seeds to 4-5 servings per week. One serving of dried beans or legumes equals  cup (90 g) cooked, 1 serving of nuts is  oz (12 almonds, 24 pistachios, or 7 walnut halves), and 1 serving of seeds equals  oz (8 g). Fats Choose healthy fats more often. Choose monounsaturated and polyunsaturated fats, such as olive and canola oils, avocado oil, flaxseeds, walnuts, almonds, and seeds. Eat more omega-3 fats. Choose salmon, mackerel, sardines, tuna, flaxseed oil, and ground flaxseeds. Aim to eat fish at least 2 times each week. Check food labels carefully to identify foods with trans fats or high amounts of saturated fat. Limit saturated fats. These are found  in animal products, such as meats, butter, and cream. Plant sources of saturated fats include palm oil, palm kernel oil, and coconut oil. Avoid foods with partially hydrogenated oils in them. These contain trans fats. Examples are stick margarine, some tub margarines, cookies, crackers, and other baked goods. Avoid fried foods. General information Eat more home-cooked food and less restaurant, buffet, and fast food. Limit or avoid  alcohol. Limit foods that are high in added sugar and simple starches such as foods made using white refined flour (white breads, pastries, sweets). Lose weight if you are overweight. Losing just 5-10% of your body weight can help your overall health and prevent diseases such as diabetes and heart disease. Monitor your sodium intake, especially if you have high blood pressure. Talk with your health care provider about your sodium intake. Try to incorporate more vegetarian meals weekly. What foods should I eat? Fruits All fresh, canned (in natural juice), or frozen fruits. Vegetables Fresh or frozen vegetables (raw, steamed, roasted, or grilled). Green salads. Grains Most grains. Choose whole wheat and whole grains most of the time. Rice and pasta, including brown rice and pastas made with whole wheat. Meats and other proteins Lean, well-trimmed beef, veal, pork, and lamb. Chicken and [place] without skin. All fish and shellfish. Wild duck, rabbit, pheasant, and venison. Egg whites or low-cholesterol egg substitutes. Dried beans, peas, lentils, and tofu. Seeds and most nuts. Dairy Low-fat or nonfat cheeses, including ricotta and mozzarella. Skim or 1% milk (liquid, powdered, or evaporated). Buttermilk made with low-fat milk. Nonfat or low-fat yogurt. Fats and oils Non-hydrogenated (trans-free) margarines. Vegetable oils, including soybean, sesame, sunflower, olive, avocado, peanut, safflower, corn, canola, and cottonseed. Salad dressings or mayonnaise made with a vegetable oil. Beverages Water (mineral or sparkling). Coffee and tea. Unsweetened ice tea. Diet beverages. Sweets and desserts Sherbet, gelatin, and fruit ice. Small amounts of dark chocolate. Limit all sweets and desserts. Seasonings and condiments All seasonings and condiments. The items listed above may not be a complete list of foods and beverages you can eat. Contact a dietitian for more options. What foods should I  avoid? Fruits Canned fruit in heavy syrup. Fruit in cream or butter sauce. Fried fruit. Limit coconut. Vegetables Vegetables cooked in cheese, cream, or butter sauce. Fried vegetables. Grains Breads made with saturated or trans fats, oils, or whole milk. Croissants. Sweet rolls. Donuts. High-fat crackers, such as cheese crackers and chips. Meats and other proteins Fatty meats, such as hot dogs, ribs, sausage, bacon, rib-eye roast or steak. High-fat deli meats, such as salami and bologna. Caviar. Domestic duck and goose. Organ meats, such as liver. Dairy Cream, sour cream, cream cheese, and creamed cottage cheese. Whole-milk cheeses. Whole or 2% milk (liquid, evaporated, or condensed). Whole buttermilk. Cream sauce or high-fat cheese sauce. Whole-milk yogurt. Fats and oils Meat fat, or shortening. Cocoa butter, hydrogenated oils, palm oil, coconut oil, palm kernel oil. Solid fats and shortenings, including bacon fat, salt pork, lard, and butter. Nondairy cream substitutes. Salad dressings with cheese or sour cream. Beverages Regular sodas and any drinks with added sugar. Sweets and desserts Frosting. Pudding. Cookies. Cakes. Pies. Milk chocolate or white chocolate. Buttered syrups. Full-fat ice cream or ice cream drinks. The items listed above may not be a complete list of foods and beverages to avoid. Contact a dietitian for more information. Summary Heart-healthy meal planning includes limiting unhealthy fats, increasing healthy fats, limiting salt (sodium) intake and making other diet and lifestyle changes. Lose weight if you are overweight.  Losing just 5-10% of your body weight can help your overall health and prevent diseases such as diabetes and heart disease. Focus on eating a balance of foods, including fruits and vegetables, low-fat or nonfat dairy, lean protein, nuts and legumes, whole grains, and heart-healthy oils and fats. This information is not intended to replace advice given to  you by your health care provider. Make sure you discuss any questions you have with your health care provider. Document Revised: 02/14/2021 Document Reviewed: 02/14/2021 Elsevier Patient Education  2023 [employer].

## 2022-06-22 NOTE — Therapy (Signed)
OUTPATIENT OCCUPATIONAL THERAPY NEURO EVALUATION  Patient Name: [name] MRN: [id number] DOB:[birth year], 57 y.o., male Today's Date: 06/26/2022  PCP: Lavinia Sharps, NP  REFERRING PROVIDER: Horton Chin, MD  END OF SESSION:  OT End of Session - 06/26/22 1508     Visit Number 1    Number of Visits 9    Date for OT Re-Evaluation 08/03/22    Authorization Type [facility]    OT Start Time 1330    OT Stop Time 1400    OT Time Calculation (min) 30 min    Activity Tolerance Patient tolerated treatment well    Behavior During Therapy WFL for tasks assessed/performed             Past Medical History:  Diagnosis Date   Anxiety    Cataract    removed left eye   Chronic combined systolic and diastolic CHF (congestive heart failure) (HCC)    EF 25-30%   Diastolic dysfunction    Dyslipidemia    Hyperlipidemia    Hypertension    Neuromuscular disorder (HCC)    neuropathy left foot   Nonischemic dilated cardiomyopathy (HCC)    felt secondary to HTN with no ischemia on nuclear stress test 05/2012, EF 25-30%   Seizures (HCC)    last seizure 05-2020- on Keppra   Shortness of breath    Stroke (HCC) 05/2020   Past Surgical History:  Procedure Laterality Date   COLONOSCOPY WITH PROPOFOL N/A 12/09/2020   Procedure: COLONOSCOPY WITH PROPOFOL;  Surgeon: Benancio Deeds, MD;  Location: [facility];  Service: Gastroenterology;  Laterality: N/A;   KNEE ARTHROSCOPY Left 04/13/2016   Procedure: ARTHROSCOPY KNEE WITH DEBRIDEMENT;  Surgeon: Valeria Batman, MD;  Location: [facility];  Service: Orthopedics;  Laterality: Left;   KNEE ARTHROSCOPY WITH MEDIAL MENISECTOMY Right 04/13/2016   Procedure: KNEE ARTHROSCOPY WITH MEDIAL MENISECTOMY;  Surgeon: Valeria Batman, MD;  Location: [facility];  Service: Orthopedics;  Laterality: LEFT not right knee   NASAL FRACTURE SURGERY     SHOULDER SURGERY Left    Patient Active  Problem List   Diagnosis Date Noted   Chronic diastolic CHF (congestive heart failure) (HCC) 11/07/2021   Left ventricular hypertrophy 11/07/2021   Osteoarthritis of left glenohumeral joint 05/04/2021   Colon cancer screening    Left hemiplegia (HCC) 07/02/2020   Cognitive deficit due to recent stroke 07/02/2020   Dysphagia, post-stroke    Vascular headache    Acute blood loss anemia    AKI (acute kidney injury) (HCC)    Essential hypertension    Seizures (HCC)    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   ICH (intracerebral hemorrhage) (HCC) 06/09/2020   Sprain of anterior talofibular ligament of left ankle 01/15/2020   Hyperlipidemia, on Lipitor 04/01/2017   Alcohol use disorder, mild, abuse 06/30/2016   Morbid obesity (HCC) 06/30/2016   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   Chronic combined systolic and diastolic CHF (congestive heart failure) (HCC) 11/08/2012   HTN (hypertension) 05/25/2012    ONSET DATE: 06/13/2022 (referral date)   REFERRING DIAG:  Diagnosis  R29.898 (ICD-10-CM) - Left hand weakness  (Left sided arm pain secondary to spasticity from stroke)   THERAPY DIAG:  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-dominant side (HCC)  Other lack of coordination  Muscle weakness (generalized)  Unsteadiness on feet  Chronic left shoulder pain  Rationale for Evaluation and Treatment: Rehabilitation  SUBJECTIVE:   SUBJECTIVE STATEMENT: I  feel like I lost a little function in my Lt arm since last seen. I also feel like my foot is not clearing and I tend to trip Pt accompanied by: self  PERTINENT HISTORY: hemorrhagic stroke 05/2020, CHF, HTN, ICM  PRECAUTIONS: Fall  WEIGHT BEARING RESTRICTIONS: No  PAIN:  Are you having pain? Yes: NPRS scale: 6/10 Pain location: Lt side of body (mostly UE and foot)  Pain description: tingling Aggravating factors: cold Relieving factors: heat  FALLS: Has patient fallen in  last 6 months? No  LIVING ENVIRONMENT: [facility] with: [facility] Lives in: one level dorm Stairs: No Has following equipment at home: Counselling psychologist, Hemi walker, Wheelchair (manual), and Shower chair (no back)   PLOF: Requires assistive device for independence and Vocation/Vocational requirements: pt is on disability  PATIENT GOALS: get my movement better in my Lt arm/hand  OBJECTIVE:   MRI results:  1. Mild-moderate rotator cuff tendinosis without tear. 2. Severe intra-articular biceps tendinosis with mild tenosynovitis. 3. 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  HAND DOMINANCE: Right  ADLs: Eating: needs assist to cut food Grooming: mod I  UB Dressing: mod I, difficulty w/ buttons LB Dressing: assist to tie shoes Toileting: mod I  Bathing: mod I w/ difficulty getting under Rt arm Tub Shower transfers: mod I  Equipment: Shower seat with back  IADLs: Shopping: mod independent Light housekeeping: mod I  Meal Prep: uses air fryer, Lew Dawes, microwave Community mobility: drives I'ly Medication management: mod I  Handwriting:  denies change  MOBILITY STATUS: Independent w/ small base cane   UPPER EXTREMITY ROM:  RUE AROM WNL's.  LUE A/ROM approx 90% w/ limited wrist extension. Pt has ataxia and dystonia with decreased control distally/Lt hand  HAND FUNCTION: Grip strength: Right: 103 lbs; Left: 30 lbs w/ significant wrist flexion  COORDINATION: Box and Blocks:  Left 10 blocks - limited by ataxia and spasticity  SENSATION: Light touch: abscent Proprioception: Impaired   EDEMA: none  MUSCLE TONE: LUE: Mild and Hypertonic  COGNITION: Overall cognitive status: Within functional limits for tasks assessed  VISION: Subjective report: No change Baseline vision: Wears glasses for reading only Visual history: corrective eye surgery  VISION ASSESSMENT: Not tested - denies change   PERCEPTION: Not tested  PRAXIS:  Not tested  OBSERVATIONS: limited by ataxic, dystonic movements   TODAY'S TREATMENT:                                                                                                                               N/A today - eval only   PATIENT EDUCATION: Education details: OT POC Person educated: Patient Education method: Explanation Education comprehension: verbalized understanding  HOME EXERCISE PROGRAM: N/A   GOALS: Goals reviewed with patient? Yes   LONG TERM GOALS: Target date: 08/03/22  Independent with updated HEP  Baseline:  Goal status: INITIAL  2.  Pt to verbalize understanding of safety considerations d/t sensory loss  LUE Baseline:  Goal status: INITIAL  3.  Pt to improve LUE functional use as evidenced by improving Box & Blocks score by 5 blocks Baseline: 10 blocks Goal status: INITIAL  4.  Pt to verbalize understanding of any A/E to increase ease, safety, and independence with ADLS (Shoe buttons, rocker knife, bath mitt, one handed cutting board?)   Baseline:  Goal status: INITIAL   ASSESSMENT:  CLINICAL IMPRESSION: Patient is a 57 y.o. male familiar to this clinic, last seen Fall 2023, who was seen today for occupational therapy evaluation for LUE weakness from stroke in 2022. Pt reports he has declined some since 2023 and would like more O.T. to improve function in LUE. Pt would benefit from short duration of O.T.   PERFORMANCE DEFICITS: in functional skills including ADLs, IADLs, coordination, sensation, tone, ROM, strength, pain, Fine motor control, Gross motor control, mobility, body mechanics, decreased knowledge of use of DME, and UE functional use.   IMPAIRMENTS: are limiting patient from ADLs, IADLs, and leisure.   CO-MORBIDITIES: may have co-morbidities  that affects occupational performance. Patient will benefit from skilled OT to address above impairments and improve overall function.  MODIFICATION OR ASSISTANCE TO COMPLETE EVALUATION: No  modification of tasks or assist necessary to complete an evaluation.  OT OCCUPATIONAL PROFILE AND HISTORY: Problem focused assessment: Including review of records relating to presenting problem.  CLINICAL DECISION MAKING: Moderate - several treatment options, min-mod task modification necessary  REHAB POTENTIAL: Good  EVALUATION COMPLEXITY: Low    PLAN:  OT FREQUENCY: 2x/week  OT DURATION: 4 weeks (plus eval) over 5 week duration as pt unable to get scheduled first week   PLANNED INTERVENTIONS: self care/ADL training, therapeutic exercise, therapeutic activity, neuromuscular re-education, manual therapy, passive range of motion, functional mobility training, aquatic therapy, splinting, moist heat, patient/family education, energy conservation, and DME and/or AE instructions  RECOMMENDED OTHER SERVICES: Pt wishes to start P.T. again due to decreased foot clearance  CONSULTED AND AGREED WITH PLAN OF CARE: Patient  PLAN FOR NEXT SESSION: update HEP    Sheran Lawless, OT 06/26/2022, 3:09 PM

## 2022-06-26 ENCOUNTER — Encounter: Payer: Self-pay | Admitting: Occupational Therapy

## 2022-06-26 ENCOUNTER — Ambulatory Visit: Payer: Self-pay | Attending: Physical Medicine and Rehabilitation | Admitting: Occupational Therapy

## 2022-06-26 ENCOUNTER — Telehealth: Payer: Self-pay | Admitting: Occupational Therapy

## 2022-06-26 DIAGNOSIS — M6281 Muscle weakness (generalized): Secondary | ICD-10-CM | POA: Insufficient documentation

## 2022-06-26 DIAGNOSIS — M25512 Pain in left shoulder: Secondary | ICD-10-CM | POA: Insufficient documentation

## 2022-06-26 DIAGNOSIS — I69354 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-dominant side: Secondary | ICD-10-CM | POA: Insufficient documentation

## 2022-06-26 DIAGNOSIS — R29898 Other symptoms and signs involving the musculoskeletal system: Secondary | ICD-10-CM | POA: Insufficient documentation

## 2022-06-26 DIAGNOSIS — R278 Other lack of coordination: Secondary | ICD-10-CM | POA: Insufficient documentation

## 2022-06-26 DIAGNOSIS — G8929 Other chronic pain: Secondary | ICD-10-CM | POA: Insufficient documentation

## 2022-06-26 DIAGNOSIS — R2681 Unsteadiness on feet: Secondary | ICD-10-CM | POA: Insufficient documentation

## 2022-06-26 NOTE — Telephone Encounter (Signed)
Hello Dr. Carlis Abbott,   I evaluated Simona Huh today for O.T. He is requesting more P.T. due to "tripping" and decreased foot clearance. If you agree, can you please send P.T. referral to [facility] outpatient Neuro via [facility]. Thanks Jene Every, OTR/L

## 2022-06-27 ENCOUNTER — Other Ambulatory Visit: Payer: Self-pay | Admitting: Physical Medicine and Rehabilitation

## 2022-06-27 DIAGNOSIS — G8194 Hemiplegia, unspecified affecting left nondominant side: Secondary | ICD-10-CM

## 2022-06-28 ENCOUNTER — Encounter: Payer: Self-pay | Admitting: Neurology

## 2022-06-28 ENCOUNTER — Ambulatory Visit: Payer: Self-pay | Admitting: Neurology

## 2022-06-28 NOTE — Progress Notes (Deleted)
Patient: [name] Date of Birth: 10/30/1965  Reason for Visit: Follow up History from: Patient Primary Neurologist: Jessica/Sethi   ASSESSMENT AND PLAN 57 y.o. year old male   1.  Right thalamic ICH with IVH 2.  Chronic headaches 3.  Seizure (post-stroke) -Continue Keppra 500 mg twice a day   HISTORY OF PRESENT ILLNESS: Today 06/28/22  HISTORY  Update 10/31/2021 JM: Patient returns for follow-up after prior visit 7 months ago regarding history of ICH, seizures and headaches.  He is unaccompanied.  Reports residual left sided weakness and nerve pain. Was previously just in foot, but now going up his whole leg to his hip.  At prior visit with PMR, recommended trialing different medications but he wishes to try to avoid medications if able.  He questions restarting therapies.  Continues to ambulate with a cane, denies any recent falls.  Does mention left side of face and tongue feels swollen over the past 2 weeks, denies any change in diet or medications.  Denies any difficulty breathing or swallowing. Compliant on Keppra, no seizure activity. Has not had any recent headaches, currently on topiramate 50 mg twice daily. On atorvastatin, denies side effects.  Blood pressure well controlled, today 112/65.  Is being seen by cardiology next week for further blood pressure management.  No further concerns at this time.    REVIEW OF SYSTEMS: Out of a complete 14 system review of symptoms, the patient complains only of the following symptoms, and all other reviewed systems are negative.  See HPI  ALLERGIES: No Known Allergies  HOME MEDICATIONS: Outpatient Medications Prior to Visit  Medication Sig Dispense Refill   amLODipine (NORVASC) 5 MG tablet Take 1 tablet (5 mg total) by mouth daily. 30 tablet 11   atorvastatin (LIPITOR) 80 MG tablet Take 1 tablet (80 mg total) by mouth daily. 90 tablet 1   baclofen (LIORESAL) 10 MG tablet Take 1 tablet (10 mg total) by mouth at bedtime. 90  each 3   cloNIDine (CATAPRES) 0.1 MG tablet Take 1 tablet (0.1 mg total) by mouth 3 (three) times daily. 90 tablet 2   diclofenac Sodium (VOLTAREN) 1 % GEL Apply 2 g topically 4 (four) times daily. 200 g 0   DULoxetine (CYMBALTA) 20 MG capsule Take 1 capsule (20 mg total) by mouth daily. 90 capsule 3   folic acid (FOLVITE) 1 MG tablet Take 1 tablet by mouth once daily 30 tablet 3   gabapentin (NEURONTIN) 300 MG capsule Take 1 capsule (300 mg total) by mouth 3 (three) times daily. 270 capsule 3   labetalol (NORMODYNE) 100 MG tablet Take 1 tablet by mouth twice daily 180 tablet 1   levETIRAcetam (KEPPRA) 500 MG tablet Take 1 tablet (500 mg total) by mouth 2 (two) times daily. 180 tablet 3   losartan (COZAAR) 50 MG tablet Take 1 tablet by mouth twice daily 180 tablet 0   Multiple Vitamins-Minerals (CERTAVITE/ANTIOXIDANTS) TABS Take 1 tablet by mouth daily. 30 tablet 0   tadalafil (CIALIS) 5 MG tablet Take 5 mg by mouth every morning.     tamsulosin (FLOMAX) 0.4 MG CAPS capsule TAKE 1 CAPSULE BY MOUTH ONCE DAILY AFTER SUPPER 30 capsule 0   thiamine 100 MG tablet Take 1 tablet (100 mg total) by mouth daily. 30 tablet 0   tiZANidine (ZANAFLEX) 4 MG tablet Take 1 tablet (4 mg total) by mouth at bedtime as needed for muscle spasms. 30 tablet 0   topiramate (TOPAMAX) 50 MG tablet Take  1 tablet (50 mg total) by mouth 2 (two) times daily. 60 tablet 5   No facility-administered medications prior to visit.    PAST MEDICAL HISTORY: Past Medical History:  Diagnosis Date   Anxiety    Cataract    removed left eye   Chronic combined systolic and diastolic CHF (congestive heart failure) (HCC)    EF 25-30%   Diastolic dysfunction    Dyslipidemia    Hyperlipidemia    Hypertension    Neuromuscular disorder (HCC)    neuropathy left foot   Nonischemic dilated cardiomyopathy (HCC)    felt secondary to HTN with no ischemia on nuclear stress test 05/2012, EF 25-30%   Seizures (HCC)    last seizure 05-2020- on  Keppra   Shortness of breath    Stroke (HCC) 05/2020    PAST SURGICAL HISTORY: Past Surgical History:  Procedure Laterality Date   COLONOSCOPY WITH PROPOFOL N/A 12/09/2020   Procedure: COLONOSCOPY WITH PROPOFOL;  Surgeon: Benancio Deeds, MD;  Location: [facility];  Service: Gastroenterology;  Laterality: N/A;   KNEE ARTHROSCOPY Left 04/13/2016   Procedure: ARTHROSCOPY KNEE WITH DEBRIDEMENT;  Surgeon: Valeria Batman, MD;  Location: [facility];  Service: Orthopedics;  Laterality: Left;   KNEE ARTHROSCOPY WITH MEDIAL MENISECTOMY Right 04/13/2016   Procedure: KNEE ARTHROSCOPY WITH MEDIAL MENISECTOMY;  Surgeon: Valeria Batman, MD;  Location: [facility];  Service: Orthopedics;  Laterality: LEFT not right knee   NASAL FRACTURE SURGERY     SHOULDER SURGERY Left     FAMILY HISTORY: Family History  Problem Relation Age of Onset   Hypertension Mother        Passed away when she is 106 secondary to hip surgery complications   CAD Mother    Heart attack Mother    Hypertension Father    Diabetes Brother    Stroke Brother    Heart attack Maternal Grandmother    Colon cancer Neg Hx    Esophageal cancer Neg Hx    Pancreatic cancer Neg Hx    Stomach cancer Neg Hx    Colon polyps Neg Hx     SOCIAL HISTORY: Social History   Socioeconomic History   Marital status: Married    Spouse name: Not on file   Number of children: Not on file   Years of education: Not on file   Highest education level: Not on file  Occupational History   Not on file  Tobacco Use   Smoking status: Former    Types: Cigarettes    Passive exposure: Past   Smokeless tobacco: Never  Vaping Use   Vaping Use: Never used  Substance and Sexual Activity   Alcohol use: Not Currently   Drug use: Not Currently    Types: Cocaine, Marijuana, Oxycodone   Sexual activity: Yes  Other Topics Concern   Not on file  Social History Narrative   Not on file   Social Determinants of  Health   Financial Resource Strain: Not on file  Food Insecurity: Not on file  Transportation Needs: Not on file  Physical Activity: Not on file  Stress: Not on file  Social Connections: Not on file  Intimate Partner Violence: Not on file    PHYSICAL EXAM  There were no vitals filed for this visit. There is no height or weight on file to calculate BMI.  Generalized: Well developed, in no acute distress  Neurological examination  Mentation: Alert oriented to time, place, history taking. Follows all  commands speech and language fluent Cranial nerve II-XII: Pupils were equal round reactive to light. Extraocular movements were full, visual field were full on confrontational test. Facial sensation and strength were normal. Uvula tongue midline. Head turning and shoulder shrug  were normal and symmetric. Motor: The motor testing reveals 5 over 5 strength of all 4 extremities. Good symmetric motor tone is noted throughout.  Sensory: Sensory testing is intact to soft touch on all 4 extremities. No evidence of extinction is noted.  Coordination: Cerebellar testing reveals good finger-nose-finger and heel-to-shin bilaterally.  Gait and station: Gait is normal. Tandem gait is normal. Romberg is negative. No drift is seen.  Reflexes: Deep tendon reflexes are symmetric and normal bilaterally.   DIAGNOSTIC DATA (LABS, IMAGING, TESTING) - I reviewed patient records, labs, notes, testing and imaging myself where available.  Lab Results  Component Value Date   WBC 4.7 11/23/2021   HGB 13.9 11/23/2021   HCT 42.0 11/23/2021   MCV 92 11/23/2021   PLT 314 11/23/2021      Component Value Date/Time   NA 144 11/23/2021 1133   K 4.0 11/23/2021 1133   CL 106 11/23/2021 1133   CO2 24 11/23/2021 1133   GLUCOSE 77 11/23/2021 1133   GLUCOSE 109 (H) 07/19/2020 0530   BUN 14 11/23/2021 1133   CREATININE 1.11 11/23/2021 1133   CALCIUM 10.2 11/23/2021 1133   PROT 7.3 11/23/2021 1133   ALBUMIN 4.7  11/23/2021 1133   AST 22 11/23/2021 1133   ALT 24 11/23/2021 1133   ALKPHOS 73 11/23/2021 1133   BILITOT 0.5 11/23/2021 1133   GFRNONAA >60 07/19/2020 0530   GFRAA >60 01/02/2018 1657   Lab Results  Component Value Date   CHOL 168 10/31/2021   HDL 59 10/31/2021   LDLCALC 86 10/31/2021   TRIG 132 10/31/2021   CHOLHDL 2.8 10/31/2021   Lab Results  Component Value Date   HGBA1C 5.0 09/14/2021   Lab Results  Component Value Date   VITAMINB12 833 06/09/2020   Lab Results  Component Value Date   TSH 0.824 11/23/2021    Margie Ege, AGNP-C, DNP 06/28/2022, 5:53 AM [facility] [address] [place], [place] [address] [phone]

## 2022-06-30 ENCOUNTER — Other Ambulatory Visit: Payer: Self-pay | Admitting: Urology

## 2022-06-30 DIAGNOSIS — R972 Elevated prostate specific antigen [PSA]: Secondary | ICD-10-CM

## 2022-07-11 ENCOUNTER — Ambulatory Visit: Payer: Self-pay | Admitting: Occupational Therapy

## 2022-07-14 ENCOUNTER — Ambulatory Visit: Payer: Self-pay | Admitting: Occupational Therapy

## 2022-07-18 ENCOUNTER — Ambulatory Visit (HOSPITAL_COMMUNITY): Payer: Self-pay | Attending: Physician Assistant

## 2022-07-18 ENCOUNTER — Encounter (HOSPITAL_COMMUNITY): Payer: Self-pay | Admitting: Physician Assistant

## 2022-07-18 ENCOUNTER — Ambulatory Visit: Payer: Self-pay | Admitting: Occupational Therapy

## 2022-07-20 ENCOUNTER — Ambulatory Visit: Payer: Self-pay | Admitting: Occupational Therapy

## 2022-07-25 ENCOUNTER — Ambulatory Visit: Payer: Medicaid Other | Admitting: Occupational Therapy

## 2022-07-25 ENCOUNTER — Ambulatory Visit: Payer: Medicaid Other | Admitting: Physical Therapy

## 2022-07-28 ENCOUNTER — Ambulatory Visit: Payer: Medicaid Other | Admitting: Occupational Therapy

## 2022-08-01 ENCOUNTER — Ambulatory Visit: Payer: Medicaid Other | Admitting: Occupational Therapy

## 2022-08-03 ENCOUNTER — Ambulatory Visit: Payer: Medicaid Other | Admitting: Occupational Therapy

## 2022-08-07 ENCOUNTER — Encounter: Payer: Self-pay | Admitting: Physical Medicine and Rehabilitation

## 2022-08-07 ENCOUNTER — Encounter
Payer: Medicaid Other | Attending: Physical Medicine and Rehabilitation | Admitting: Physical Medicine and Rehabilitation

## 2022-08-07 VITALS — BP 121/72 | HR 74 | Ht 69.5 in | Wt 188.0 lb

## 2022-08-07 DIAGNOSIS — G8194 Hemiplegia, unspecified affecting left nondominant side: Secondary | ICD-10-CM | POA: Diagnosis present

## 2022-08-07 DIAGNOSIS — G629 Polyneuropathy, unspecified: Secondary | ICD-10-CM | POA: Insufficient documentation

## 2022-08-07 DIAGNOSIS — I10 Essential (primary) hypertension: Secondary | ICD-10-CM | POA: Diagnosis present

## 2022-08-07 DIAGNOSIS — Z630 Problems in relationship with spouse or partner: Secondary | ICD-10-CM | POA: Insufficient documentation

## 2022-08-07 MED ORDER — GABAPENTIN 300 MG PO CAPS: 300.0000 mg | ORAL_CAPSULE | Freq: Three times a day (TID) | ORAL | 3 refills | Status: DC

## 2022-08-07 MED ORDER — AMLODIPINE BESYLATE 2.5 MG PO TABS: 2.5000 mg | ORAL_TABLET | Freq: Every day | ORAL | 3 refills | Status: DC
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
## 2022-08-08 ENCOUNTER — Telehealth: Payer: Self-pay | Admitting: Physical Medicine and Rehabilitation

## 2022-08-08 NOTE — Telephone Encounter (Signed)
Attempted x2 to contact patient He answer said wrong number and hung up. Checked # and redialed patient hung up again. Labetalol was ordered and filled by his primary care office.

## 2022-08-08 NOTE — Telephone Encounter (Signed)
Patient came in and asked for medication refill for on lebetalol 100mg , he forgot to mention during visit yesterday and would like it sent to pharmacy on file [facility]

## 2022-08-13 ENCOUNTER — Other Ambulatory Visit: Payer: Self-pay | Admitting: Family

## 2022-08-13 DIAGNOSIS — I10 Essential (primary) hypertension: Secondary | ICD-10-CM

## 2022-08-15 ENCOUNTER — Encounter: Payer: Self-pay | Admitting: Physical Therapy

## 2022-08-15 ENCOUNTER — Other Ambulatory Visit: Payer: Self-pay

## 2022-08-15 ENCOUNTER — Ambulatory Visit: Payer: Medicaid Other | Attending: Physical Medicine and Rehabilitation | Admitting: Physical Therapy

## 2022-08-15 ENCOUNTER — Other Ambulatory Visit: Payer: Self-pay | Admitting: Physical Medicine and Rehabilitation

## 2022-08-15 VITALS — BP 137/77 | HR 79

## 2022-08-15 DIAGNOSIS — G8194 Hemiplegia, unspecified affecting left nondominant side: Secondary | ICD-10-CM

## 2022-08-15 DIAGNOSIS — R2689 Other abnormalities of gait and mobility: Secondary | ICD-10-CM | POA: Diagnosis present

## 2022-08-15 DIAGNOSIS — M6281 Muscle weakness (generalized): Secondary | ICD-10-CM | POA: Insufficient documentation

## 2022-08-15 DIAGNOSIS — R2681 Unsteadiness on feet: Secondary | ICD-10-CM | POA: Insufficient documentation

## 2022-08-15 NOTE — Therapy (Addendum)
OUTPATIENT PHYSICAL THERAPY NEURO EVALUATION   Patient Name: [name] MRN: [id number] DOB:1965/08/28, 57 y.o., male Today's Date: 08/17/2022   PCP: Patient is between PCP due to insurance REFERRING PROVIDER: Horton Chin, MDP     08/15/22 1235  PT Visits / Re-Eval  Visit Number 1  Number of Visits 7 (including eval)  Date for PT Re-Evaluation 10/19/22 (written longer to accomodate schedule fullness)  Authorization  Authorization Type [place] [employer]  PT Time Calculation  PT Start Time 1230  PT Stop Time 1315  PT Time Calculation (min) 45 min  PT - End of Session  Equipment Utilized During Treatment Gait belt  Activity Tolerance Patient tolerated treatment well  Behavior During Therapy WFL for tasks assessed/performed    Past Medical History:  Diagnosis Date   Anxiety    Cataract    removed left eye   Chronic combined systolic and diastolic CHF (congestive heart failure) (HCC)    EF 25-30%   Diastolic dysfunction    Dyslipidemia    Hyperlipidemia    Hypertension    Neuromuscular disorder (HCC)    neuropathy left foot   Nonischemic dilated cardiomyopathy (HCC)    felt secondary to HTN with no ischemia on nuclear stress test 05/2012, EF 25-30%   Seizures (HCC)    last seizure 05-2020- on Keppra   Shortness of breath    Stroke (HCC) 05/2020   Past Surgical History:  Procedure Laterality Date   COLONOSCOPY WITH PROPOFOL N/A 12/09/2020   Procedure: COLONOSCOPY WITH PROPOFOL;  Surgeon: Benancio Deeds, MD;  Location: [facility];  Service: Gastroenterology;  Laterality: N/A;   KNEE ARTHROSCOPY Left 04/13/2016   Procedure: ARTHROSCOPY KNEE WITH DEBRIDEMENT;  Surgeon: Valeria Batman, MD;  Location: [facility];  Service: Orthopedics;  Laterality: Left;   KNEE ARTHROSCOPY WITH MEDIAL MENISECTOMY Right 04/13/2016   Procedure: KNEE ARTHROSCOPY WITH MEDIAL MENISECTOMY;  Surgeon: Valeria Batman, MD;  Location: [facility];   Service: Orthopedics;  Laterality: LEFT not right knee   NASAL FRACTURE SURGERY     SHOULDER SURGERY Left    Patient Active Problem List   Diagnosis Date Noted   Chronic diastolic CHF (congestive heart failure) (HCC) 11/07/2021   Left ventricular hypertrophy 11/07/2021   Osteoarthritis of left glenohumeral joint 05/04/2021   Colon cancer screening    Left hemiplegia (HCC) 07/02/2020   Cognitive deficit due to recent stroke 07/02/2020   Dysphagia, post-stroke    Vascular headache    Acute blood loss anemia    AKI (acute kidney injury) (HCC)    Essential hypertension    Seizures (HCC)    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   ICH (intracerebral hemorrhage) (HCC) 06/09/2020   Sprain of anterior talofibular ligament of left ankle 01/15/2020   Hyperlipidemia, on Lipitor 04/01/2017   Alcohol use disorder, mild, abuse 06/30/2016   Morbid obesity (HCC) 06/30/2016   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   Chronic combined systolic and diastolic CHF (congestive heart failure) (HCC) 11/08/2012   HTN (hypertension) 05/25/2012    ONSET DATE: 10/31/2021   REFERRING DIAG: I61.9 (ICD-10-CM) - Nontraumatic thalamic hemorrhage (HCC) G81.94 (ICD-10-CM) - Left hemiparesis (HCC)   THERAPY DIAG:  Other abnormalities of gait and mobility - Plan: PT plan of care cert/re-cert  Unsteadiness on feet - Plan: PT plan of care cert/re-cert  Muscle weakness (generalized) - Plan: PT plan of care cert/re-cert  Rationale for Evaluation and Treatment Rehabilitation  SUBJECTIVE:  SUBJECTIVE STATEMENT:  Pt seen multiple times in this clinic for deficits related to 06/09/2020 CVA. Patient reports that he is currently living in temporary housing. Patient reports that he has run out of his gabapentin which is  making the tingling worse in his arms/legs. Patient would like to restart OT as well now that he has new insurance. Patient reports that he had a fall about a month ago and hit his elbow. He reports that he hit the back of his head and had some dizziness at the time. Patient is wanting to make sure he is the only one who has access to his medical records. Patient arrives to clinic with [facility] quad tip cane. Patient reports feeling unsafe with x-wife but denies need for additional resources stating he has other people who are helping him navigate this at this time.   Pt accompanied by: self  PERTINENT HISTORY: HTN, Noischemic dilated cardiomyopathy, chronic combined CHF, ETOH, hyperlipidemia, ICH, seizures, CVA 06/09/2020  PAIN:  Are you having pain? No-reports some tingling along the L side  PRECAUTIONS: Fall  WEIGHT BEARING RESTRICTIONS No  FALLS: Has patient fallen in last 6 months? Yes. Number of falls 1-he was holding a door and LOB, was able to get up by self.  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 Expected reduction in size of hemorrhage compared to 06/19/20. No acute or new findings.  COGNITION: Overall cognitive status: Impaired   SENSATION: Light touch: Impaired  and decreased sensation on L side  COORDINATION: Dysmetric with heel to shin on LLE  EDEMA:  WFL  MUSCLE TONE: LLE spasticity noted during ambulation, mild catch in seated  POSTURE:  forward head  LOWER EXTREMITY ROM:     L hemiparetic side = mild  decreased hip extension, dorsiflexion, and increased hamstring tightness (varies given tone)  LOWER EXTREMITY MMT:    MMT Right Eval Left Eval  Hip flexion 4+/5 3+/5  Hip extension    Hip abduction 4/5 3+/5  Hip adduction 4+/5 3+/5  Hip internal rotation    Hip external rotation    Knee flexion 5/5 4-/5  Knee extension 5/5 4+/5  Ankle dorsiflexion 5/5 3-/5  Ankle plantarflexion    Ankle inversion    Ankle eversion    (Blank rows = not tested)  BED MOBILITY:  Sit to supine Complete Independence Supine to sit Complete Independence Pt reports independence.  TRANSFERS: Assistive device utilized:  Writer tip cane   Sit to stand: SBA Stand to sit: SBA Chair to chair: SBA  GAIT: Gait pattern: decreased hip/knee flexion- Left, decreased ankle dorsiflexion- Left, decreased trunk rotation, wide BOS, abducted- Left, and poor foot clearance- Left Distance walked: various clinic distances Assistive device utilized: small rubber tip cane, none Level of assistance: SBA Comments: Compensates well both with/without AD, decreased LE on L but no major difference noted over short distances in today's session   STAIRS: Level of Assistance: CGA Stair Negotiation Technique: Step to Pattern with Single Rail on Right  Number of Stairs: 4  Height of Stairs: 6"  Comments: CGA with min signs of instability, steps up with right and down with L  FUNCTIONAL TESTs:     08/15/22 0001  Standardized Balance Assessment  Standardized Balance Assessment Five Times Sit to Stand;TUG;10 meter walk test  Five times sit to stand comments  11.20 (seconds without UE use (bias towards RLE))  10 Meter Walk 0.69 (m/s with SPC quad tip cane with SBA (0.67 m/s without AD with SBA))    PATIENT SURVEYS:  FOTO not applicable due to chronic nature of CVA  TODAY'S TREATMENT:  N/A  PATIENT EDUCATION: Education details: PT POC,  assessments used and to be used, and goals to be set. Person educated: Patient Education method: Explanation Education comprehension: verbalized understanding and needs further education   HOME EXERCISE PROGRAM: Prior HEP:   [id number]  GOALS: Goals reviewed with patient? Yes  SHORT TERM GOALS: Target date: 09/07/2022  Pt will be independent with strength and balance HEP to promote maintenance of gains outside the clinic.  Baseline:  To be provided Goal status: INITIAL   2. Therapist will assess without AD / write LTG as indicated  Baseline: To be assessed Goal status: INITIAL  LONG TERM GOALS: Target date: 10/19/2022  Pt to be independent with advance strength and balance HEP. Baseline: To be modified. Goal status: INITIAL  2.  Patient will improve gait speed to 0.8 m/s with LRAD to indicate improvement to the level of community ambulator in order to participate more easily in activities outside of the home.   Baseline: 0.69 m/s with rubber tip quad cane Goal status: INITIAL  3.  Patient will improve ability to ascend/descend 2 x 4 stairs with SBA and single use of rail with step to pattern to improve access to community. Baseline: CGA x 4 stairs Goal status: INITIAL  4.  to be assessed without AD/ LTG written Baseline: To be assessed Goal status: INITIAL    ASSESSMENT:  CLINICAL IMPRESSION: Patient is a 57 y.o. male who was seen today for physical therapy evaluation and treatment for gait abnormalities and chronic left hemiparesis related to prior CVA in May 2022.  Pt has a significant PMH of HTN, Noischemic dilated cardiomyopathy, chronic combined CHF, ETOH, hyperlipidemia, ICH, and seizures. Patient presents with deficits in gait speed and stair negotiation. Patient demonstrates progression to single point large rubber tip cane and is hoping to progress to ambulating without AD. Patient able to tolerate short distances well in today's session without AD but  will continue to progress endurance. Patient requires CGA on stairs at this time. He would benefit from skilled PT to address impairments as noted and progress towards long term goals.  OBJECTIVE IMPAIRMENTS Abnormal gait, decreased activity tolerance, decreased balance, decreased coordination, decreased endurance, decreased mobility, difficulty walking, decreased strength, decreased safety awareness, increased edema, impaired sensation, impaired tone, improper body mechanics, and postural dysfunction.   ACTIVITY LIMITATIONS carrying, lifting, stairs, transfers, and locomotion level  PARTICIPATION LIMITATIONS: community activity and occupation  PERSONAL FACTORS Age, Fitness, Past/current experiences, Time since onset of injury/illness/exacerbation, and 1-2 comorbidities: HTN, chronic CHF/cardiomyopathy  are also affecting patient's functional outcome.   REHAB POTENTIAL: Fair See personal factors  CLINICAL DECISION MAKING: Stable/uncomplicated  EVALUATION COMPLEXITY: Low  PLAN: PT FREQUENCY: 1x/week  PT DURATION: 6 weeks  PLANNED INTERVENTIONS: Therapeutic exercises, Therapeutic activity, Neuromuscular re-education, Balance training, Gait training, Patient/Family education, Self Care, Joint mobilization, Stair training, Vestibular training, Orthotic/Fit training, DME instructions, Electrical  stimulation, Manual therapy, and Re-evaluation  PLAN FOR NEXT SESSION: Assess without AD, update former HEP, work on stairs, staggered sit to stands; new OT referral?   Carmelia Bake, PT, DPT 08/17/2022, 5:13 PM   Check all possible CPT codes: 09604 - PT Re-evaluation, 97110- Therapeutic Exercise, [phone]- Neuro Re-education, [phone] - Gait Training, [phone] - Manual Therapy, [phone] - Therapeutic Activities, and [phone] - Self Care    Check all conditions that are expected to impact treatment: {Conditions expected to impact treatment:Contractures, spasticity or fracture relevant to requested treatment  and Neurological condition and/or seizures   If treatment provided at initial evaluation, no treatment charged due to lack of authorization.

## 2022-08-16 ENCOUNTER — Telehealth: Payer: Self-pay | Admitting: Family

## 2022-08-16 ENCOUNTER — Other Ambulatory Visit: Payer: Self-pay

## 2022-08-16 ENCOUNTER — Telehealth: Payer: Self-pay | Admitting: *Deleted

## 2022-08-16 DIAGNOSIS — I10 Essential (primary) hypertension: Secondary | ICD-10-CM

## 2022-08-16 MED ORDER — LABETALOL HCL 100 MG PO TABS
100.0000 mg | ORAL_TABLET | Freq: Two times a day (BID) | ORAL | 0 refills | Status: DC
Start: 2022-08-16 — End: 2022-08-24

## 2022-08-16 NOTE — Telephone Encounter (Signed)
error 

## 2022-08-16 NOTE — Telephone Encounter (Signed)
Rx sent and pt notified.

## 2022-08-16 NOTE — Telephone Encounter (Signed)
Patient came in to get refill for Labetalol 100mg  sent [facility] on [address]. Patient states he has four pills left

## 2022-08-18 ENCOUNTER — Other Ambulatory Visit: Payer: Self-pay | Admitting: Physical Medicine and Rehabilitation

## 2022-08-18 DIAGNOSIS — G8194 Hemiplegia, unspecified affecting left nondominant side: Secondary | ICD-10-CM

## 2022-08-24 ENCOUNTER — Ambulatory Visit: Payer: Medicaid Other | Admitting: Family

## 2022-08-24 ENCOUNTER — Encounter: Payer: Self-pay | Admitting: Family

## 2022-08-24 VITALS — BP 106/69 | HR 73 | Temp 98.2°F | Ht 69.5 in | Wt 190.0 lb

## 2022-08-24 DIAGNOSIS — I10 Essential (primary) hypertension: Secondary | ICD-10-CM

## 2022-08-24 DIAGNOSIS — M25522 Pain in left elbow: Secondary | ICD-10-CM

## 2022-08-24 MED ORDER — LABETALOL HCL 100 MG PO TABS
100.0000 mg | ORAL_TABLET | Freq: Two times a day (BID) | ORAL | 0 refills | Status: DC
Start: 2022-08-24 — End: 2022-11-24

## 2022-08-24 NOTE — Progress Notes (Signed)
Patient ID: [name], male    DOB: December 04, 1965  MRN: [id number]  CC: Referral   Subjective: [name] is a 57 y.o. male who presents for referral.   His concerns today include:  - Left elbow pain with numbness x 2 months. He denies recent trauma/injury/red flag symptoms. States he was recently seen by Orthopedics and told "everything will eventually go away" but hasn't. He would like second opinion referral to Orthopedics. - Established with Cardiology. Needs refills of Labetalol. He does not complain of red flag symptoms such as but not limited to chest pain, shortness of breath, worst headache of life, nausea/vomiting.  - Established with Neurology. - Established with Urology.   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 90 tablet 3   atorvastatin (LIPITOR) 80 MG tablet Take 1 tablet (80 mg total) by mouth daily. 90  tablet 1   baclofen (LIORESAL) 10 MG tablet Take 1 tablet (10 mg total) by mouth at bedtime. 90 each 3   diclofenac Sodium (VOLTAREN) 1 % GEL Apply 2 g topically 4 (four) times daily. 200 g 0   DULoxetine (CYMBALTA) 20 MG capsule Take 1 capsule (20 mg total) by mouth daily. 90 capsule 3   folic acid (FOLVITE) 1 MG tablet Take 1 tablet by mouth once daily 30 tablet 3   gabapentin (NEURONTIN) 300 MG capsule Take 1 capsule (300 mg total) by mouth 3 (three) times daily. 270 capsule 3   levETIRAcetam (KEPPRA) 500 MG tablet Take 1 tablet (500 mg total) by mouth 2 (two) times daily. 180 tablet 3   losartan (COZAAR) 50 MG tablet Take 1 tablet by mouth twice daily 180 tablet 0   Multiple Vitamins-Minerals (CERTAVITE/ANTIOXIDANTS) TABS Take 1 tablet by mouth daily. 30 tablet 0   tadalafil (CIALIS) 5 MG tablet Take 5 mg by mouth every morning.     tamsulosin (FLOMAX) 0.4 MG CAPS capsule TAKE 1 CAPSULE BY MOUTH ONCE DAILY AFTER SUPPER 30 capsule 0   thiamine 100 MG tablet Take 1 tablet (100 mg total) by mouth daily. 30 tablet 0   tiZANidine (ZANAFLEX) 4 MG tablet Take 1 tablet (4 mg total) by mouth at bedtime as needed for muscle spasms. 30 tablet 0   topiramate (TOPAMAX) 50 MG tablet Take 1 tablet (50 mg total) by mouth 2 (two) times daily. 60 tablet 5   cloNIDine (CATAPRES) 0.1 MG tablet Take 1 tablet (0.1  mg total) by mouth 3 (three) times daily. 90 tablet 2   No current facility-administered medications on file prior to visit.    No Known Allergies  Social History   Socioeconomic History   Marital status: Married    Spouse name: Not on file   Number of children: Not on file   Years of education: Not on file   Highest education level: Not on file  Occupational History   Not on file  Tobacco Use   Smoking status: Former    Types: Cigarettes    Passive exposure: Past   Smokeless tobacco: Never  Vaping Use   Vaping status: Never Used  Substance and Sexual Activity   Alcohol use: Not  Currently   Drug use: Not Currently    Types: Cocaine, Marijuana, Oxycodone   Sexual activity: Yes  Other Topics Concern   Not on file  Social History Narrative   Not on file   Social Determinants of Health   Financial Resource Strain: Not on file  Food Insecurity: Not on file  Transportation Needs: Not on file  Physical Activity: Not on file  Stress: Not on file  Social Connections: Unknown (06/07/2021)   Received from [facility]   Social Network    Social Network: Not on file  Intimate Partner Violence: Unknown (04/28/2021)   Received from [facility]   HITS    Physically Hurt: Not on file    Insult or Talk Down To: Not on file    Threaten Physical Harm: Not on file    Scream or Curse: Not on file    Family History  Problem Relation Age of Onset   Hypertension Mother        Passed away when she is 65 secondary to hip surgery complications   CAD Mother    Heart attack Mother    Hypertension Father    Diabetes Brother    Stroke Brother    Heart attack Maternal Grandmother    Colon cancer Neg Hx    Esophageal cancer Neg Hx    Pancreatic cancer Neg Hx    Stomach cancer Neg Hx    Colon polyps Neg Hx     Past Surgical History:  Procedure Laterality Date   COLONOSCOPY WITH PROPOFOL N/A 12/09/2020   Procedure: COLONOSCOPY WITH PROPOFOL;  Surgeon: Benancio Deeds, MD;  Location: [facility];  Service: Gastroenterology;  Laterality: N/A;   KNEE ARTHROSCOPY Left 04/13/2016   Procedure: ARTHROSCOPY KNEE WITH DEBRIDEMENT;  Surgeon: Valeria Batman, MD;  Location: [facility];  Service: Orthopedics;  Laterality: Left;   KNEE ARTHROSCOPY WITH MEDIAL MENISECTOMY Right 04/13/2016   Procedure: KNEE ARTHROSCOPY WITH MEDIAL MENISECTOMY;  Surgeon: Valeria Batman, MD;  Location: [facility];  Service: Orthopedics;  Laterality: LEFT not right knee   NASAL FRACTURE SURGERY     SHOULDER SURGERY Left     ROS: Review of Systems Negative  except as stated above  PHYSICAL EXAM: BP 106/69   Pulse 73   Temp 98.2 F (36.8 C) (Oral)   Ht 5' 9.5" (1.765 m)   Wt 190 lb (86.2 kg)   SpO2 92%   BMI 27.66 kg/m   Physical Exam HENT:     Head: Normocephalic and atraumatic.     Nose: Nose normal.     Mouth/Throat:     Mouth: Mucous membranes are moist.     Pharynx: Oropharynx is clear.  Eyes:     Extraocular Movements: Extraocular movements  intact.     Conjunctiva/sclera: Conjunctivae normal.     Pupils: Pupils are equal, round, and reactive to light.  Cardiovascular:     Rate and Rhythm: Normal rate and regular rhythm.     Pulses: Normal pulses.     Heart sounds: Normal heart sounds.  Pulmonary:     Effort: Pulmonary effort is normal.     Breath sounds: Normal breath sounds.  Musculoskeletal:        General: Normal range of motion.     Right shoulder: Normal.     Left shoulder: Normal.     Right upper arm: Normal.     Left upper arm: Normal.     Right elbow: Normal.     Left elbow: Normal.     Right forearm: Normal.     Left forearm: Normal.     Right wrist: Normal.     Left wrist: Normal.     Right hand: Normal.     Left hand: Normal.     Cervical back: Normal range of motion and neck supple.  Neurological:     General: No focal deficit present.     Mental Status: He is alert and oriented to person, place, and time.  Psychiatric:        Mood and Affect: Mood normal.        Behavior: Behavior normal.     ASSESSMENT AND PLAN: 1. Left elbow pain - Second opinion referral to Orthopedics for further evaluation/management. - Ambulatory referral to Orthopedics  2. Essential hypertension - Continue present management.  - Labetalol refilled per patient request.  - Keep all scheduled appointments with Cardiology.  - labetalol (NORMODYNE) 100 MG tablet; Take 1 tablet (100 mg total) by mouth 2 (two) times daily.  Dispense: 180 tablet; Refill: 0   Patient was given the opportunity to ask questions.  Patient  verbalized understanding of the plan and was able to repeat key elements of the plan. Patient was given clear instructions to go to Emergency Department or return to medical center if symptoms don't improve, worsen, or new problems develop.The patient verbalized understanding.   Orders Placed This Encounter  Procedures   Ambulatory referral to Orthopedics     Requested Prescriptions   Signed Prescriptions Disp Refills   labetalol (NORMODYNE) 100 MG tablet 180 tablet 0    Sig: Take 1 tablet (100 mg total) by mouth 2 (two) times daily.    Follow-up with primary provider as scheduled.   Rema Fendt, NP

## 2022-08-24 NOTE — Progress Notes (Signed)
Pt needs refill on labetalol.  Pt hit his elbow and has been having issues since wondering if he can get referral.

## 2022-08-29 ENCOUNTER — Ambulatory Visit (HOSPITAL_COMMUNITY): Payer: Medicaid Other | Attending: Cardiovascular Disease

## 2022-08-29 ENCOUNTER — Telehealth: Payer: Self-pay | Admitting: Cardiovascular Disease

## 2022-08-29 DIAGNOSIS — R0609 Other forms of dyspnea: Secondary | ICD-10-CM | POA: Insufficient documentation

## 2022-08-29 DIAGNOSIS — R079 Chest pain, unspecified: Secondary | ICD-10-CM | POA: Insufficient documentation

## 2022-08-29 LAB — ECHOCARDIOGRAM COMPLETE
Area-P 1/2: 3.95 cm2
S' Lateral: 2.5 cm

## 2022-08-29 NOTE — Telephone Encounter (Signed)
Patient came in requesting refill for Labetalol 100 mg. Let him know someone would be reaching out as as soon as possible.

## 2022-08-30 ENCOUNTER — Ambulatory Visit: Payer: Medicaid Other | Attending: Physical Medicine and Rehabilitation | Admitting: Physical Therapy

## 2022-08-30 ENCOUNTER — Encounter: Payer: Self-pay | Admitting: Physical Therapy

## 2022-08-30 DIAGNOSIS — R2681 Unsteadiness on feet: Secondary | ICD-10-CM | POA: Diagnosis present

## 2022-08-30 DIAGNOSIS — M25512 Pain in left shoulder: Secondary | ICD-10-CM | POA: Diagnosis present

## 2022-08-30 DIAGNOSIS — R278 Other lack of coordination: Secondary | ICD-10-CM | POA: Diagnosis present

## 2022-08-30 DIAGNOSIS — R29818 Other symptoms and signs involving the nervous system: Secondary | ICD-10-CM | POA: Insufficient documentation

## 2022-08-30 DIAGNOSIS — R208 Other disturbances of skin sensation: Secondary | ICD-10-CM | POA: Diagnosis present

## 2022-08-30 DIAGNOSIS — I69354 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-dominant side: Secondary | ICD-10-CM | POA: Diagnosis present

## 2022-08-30 DIAGNOSIS — M6281 Muscle weakness (generalized): Secondary | ICD-10-CM | POA: Diagnosis present

## 2022-08-30 DIAGNOSIS — R2689 Other abnormalities of gait and mobility: Secondary | ICD-10-CM | POA: Insufficient documentation

## 2022-08-30 DIAGNOSIS — G8929 Other chronic pain: Secondary | ICD-10-CM | POA: Diagnosis present

## 2022-08-30 DIAGNOSIS — I69154 Hemiplegia and hemiparesis following nontraumatic intracerebral hemorrhage affecting left non-dominant side: Secondary | ICD-10-CM | POA: Insufficient documentation

## 2022-08-30 DIAGNOSIS — R29898 Other symptoms and signs involving the musculoskeletal system: Secondary | ICD-10-CM | POA: Diagnosis present

## 2022-08-30 NOTE — Therapy (Signed)
OUTPATIENT PHYSICAL THERAPY NEURO TREATMENT   Patient Name: [name] MRN: [id number] DOB:1965/01/27, 57 y.o., male Today's Date: 08/30/2022   PCP: Patient is between PCP due to insurance REFERRING PROVIDER: Horton Chin, MDP     PT End of Session - 08/30/22 1525     Visit Number 2    Number of Visits 7   including eval   Date for PT Re-Evaluation 10/19/22   written longer to accomodate schedule fullness   Authorization Type [place] Medicaid    PT Start Time 1520    PT Stop Time 1611    PT Time Calculation (min) 51 min    Equipment Utilized During Treatment Gait belt    Activity Tolerance Patient tolerated treatment well    Behavior During Therapy WFL for tasks assessed/performed            Past Medical History:  Diagnosis Date   Anxiety    Cataract    removed left eye   Chronic combined systolic and diastolic CHF (congestive heart failure) (HCC)    EF 25-30%   Diastolic dysfunction    Dyslipidemia    Hyperlipidemia    Hypertension    Neuromuscular disorder (HCC)    neuropathy left foot   Nonischemic dilated cardiomyopathy (HCC)    felt secondary to HTN with no ischemia on nuclear stress test 05/2012, EF 25-30%   Seizures (HCC)    last seizure 05-2020- on Keppra   Shortness of breath    Stroke (HCC) 05/2020   Past Surgical History:  Procedure Laterality Date   COLONOSCOPY WITH PROPOFOL N/A 12/09/2020   Procedure: COLONOSCOPY WITH PROPOFOL;  Surgeon: Benancio Deeds, MD;  Location: [facility];  Service: Gastroenterology;  Laterality: N/A;   KNEE ARTHROSCOPY Left 04/13/2016   Procedure: ARTHROSCOPY KNEE WITH DEBRIDEMENT;  Surgeon: Valeria Batman, MD;  Location: [facility];  Service: Orthopedics;  Laterality: Left;   KNEE ARTHROSCOPY WITH MEDIAL MENISECTOMY Right 04/13/2016   Procedure: KNEE ARTHROSCOPY WITH MEDIAL MENISECTOMY;  Surgeon: Valeria Batman, MD;  Location: [facility];  Service: Orthopedics;  Laterality:  LEFT not right knee   NASAL FRACTURE SURGERY     SHOULDER SURGERY Left    Patient Active Problem List   Diagnosis Date Noted   Chronic diastolic CHF (congestive heart failure) (HCC) 11/07/2021   Left ventricular hypertrophy 11/07/2021   Osteoarthritis of left glenohumeral joint 05/04/2021   Colon cancer screening    Left hemiplegia (HCC) 07/02/2020   Cognitive deficit due to recent stroke 07/02/2020   Dysphagia, post-stroke    Vascular headache    Acute blood loss anemia    AKI (acute kidney injury) (HCC)    Essential hypertension    Seizures (HCC)    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   ICH (intracerebral hemorrhage) (HCC) 06/09/2020   Sprain of anterior talofibular ligament of left ankle 01/15/2020   Hyperlipidemia, on Lipitor 04/01/2017   Alcohol use disorder, mild, abuse 06/30/2016   Morbid obesity (HCC) 06/30/2016   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   Chronic combined systolic and diastolic CHF (congestive heart failure) (HCC) 11/08/2012   HTN (hypertension) 05/25/2012    ONSET DATE: 10/31/2021   REFERRING DIAG: I61.9 (ICD-10-CM) - Nontraumatic thalamic hemorrhage (HCC) G81.94 (ICD-10-CM) - Left hemiparesis (HCC)   THERAPY DIAG:  Other abnormalities of gait and mobility  Unsteadiness on feet  Muscle weakness (generalized)  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-dominant side (HCC)  Other lack of coordination  Rationale for Evaluation and Treatment Rehabilitation  SUBJECTIVE:                                                                                                                                                                                             SUBJECTIVE STATEMENT:  Pt ambulates into clinic using rubber tip SPC w/o left AFO.  He has several instances of toe scuff requiring close SBA-CGA to safely reach mat table.  He endorses ongoing tingling in left side.  He denies recent falls or acute  changes.  Pt accompanied by: self  PERTINENT HISTORY: HTN, Noischemic dilated cardiomyopathy, chronic combined CHF, ETOH, hyperlipidemia, ICH, seizures, CVA 06/09/2020  PAIN:  Are you having pain? Yes: NPRS scale: 5/10 Pain location: left side Pain description: pulsating, needles Aggravating factors: no movement Relieving factors: walking  PRECAUTIONS: Fall  WEIGHT BEARING RESTRICTIONS No  FALLS: Has patient fallen in last 6 months? Yes. Number of falls 1-he was holding a door and LOB, was able to get up by self.  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 Expected reduction in size of hemorrhage compared to 06/19/20. No acute or new findings.  COGNITION: Overall cognitive status: Impaired   SENSATION: Light touch: Impaired  and decreased sensation on L side  COORDINATION: Dysmetric with heel to shin on LLE  EDEMA:  WFL  MUSCLE TONE: LLE spasticity noted during ambulation, mild catch in seated  POSTURE: forward head  LOWER EXTREMITY ROM:     L hemiparetic side = mild decreased hip extension, dorsiflexion, and increased hamstring tightness (varies given tone)  LOWER EXTREMITY MMT:    MMT Right Eval Left Eval  Hip flexion 4+/5 3+/5  Hip extension    Hip abduction 4/5 3+/5  Hip adduction 4+/5 3+/5  Hip internal rotation    Hip external rotation    Knee flexion 5/5 4-/5  Knee extension 5/5 4+/5  Ankle dorsiflexion 5/5 3-/5  Ankle  plantarflexion    Ankle inversion    Ankle eversion    (Blank rows = not tested)  BED MOBILITY:  Sit to supine Complete Independence Supine to sit Complete  Independence Pt reports independence.  TRANSFERS: Assistive device utilized:  Writer tip cane   Sit to stand: SBA Stand to sit: SBA Chair to chair: SBA  GAIT: Gait pattern: decreased hip/knee flexion- Left, decreased ankle dorsiflexion- Left, decreased trunk rotation, wide BOS, abducted- Left, and poor foot clearance- Left Distance walked: various clinic distances Assistive device utilized: small rubber tip cane, none Level of assistance: SBA Comments: Compensates well both with/without AD, decreased LE on L but no major difference noted over short distances in today's session   STAIRS: Level of Assistance: CGA Stair Negotiation Technique: Step to Pattern with Single Rail on Right Number of Stairs: 4  Height of Stairs: 6"  Comments: CGA with min signs of instability, steps up with right and down with L  FUNCTIONAL TESTs:     08/15/22 0001  Standardized Balance Assessment  Standardized Balance Assessment Five Times Sit to Stand;TUG;10 meter walk test  Five times sit to stand comments  11.20 (seconds without UE use (bias towards RLE))  10 Meter Walk 0.69 (m/s with SPC quad tip cane with SBA (0.67 m/s without AD with SBA))    PATIENT SURVEYS:  FOTO not applicable due to chronic nature of CVA  TODAY'S TREATMENT:  08/30/2022: - w/o AD:  665 ft CGA  -Staggered STS x10 w/ LLE in rear SBA, pt does well without hand support -PT ambulates patient to and from counter x25' each way having patient practice tight spaces and obstacle negotiation w/ LLE placement.  CGA. At counter:  -Standing heel raises 2x10, cued for eccentric lower  -SLS 2x35 seconds each LE, pt instructed to improve upright posture when practicing at home  -Supine single leg bridge (attempted w/ pt unable to lift creating vertical sliding),  regressed to standard supine bridge x10  PATIENT EDUCATION: Education details: If patient has access to his left AFO from prior PT appts then please bring it to sessions- he is unsure if he has it due to leaving several belongings behind in separation.  He does state he did not want to wear it as he did not like having it in his shoe, PT provided education on catching foot and fall risk without some form of support for left foot.  Reminder of appt with orthopedic doctor for pain in right knee and follow-up with [facility] 9/17 as pt has appts mixed up.  Informed pt of scheduled OT eval 8/21.  Provided reprint of updated scheduled visits.  Initial HEP and places to set up to practice in temporary housing situation. Person educated: Patient Education method: Explanation Education comprehension: verbalized understanding and needs further education   HOME EXERCISE PROGRAM:  Access Code: [email or web address] URL: [email or web address] Date: 08/30/2022 Prepared by: Camille Bal  Exercises - Sit to/from Stand in Stride position  - 1 x daily - 5 x weekly - 1 sets - 10 reps - Standing Single Leg Stance with Counter Support  - 1 x daily - 5 x weekly - 1 sets - 3 reps - 10 hold - Heel Raises with Counter Support  - 1 x daily - 4-5 x weekly - 2 sets - 10 reps - Supine Bridge  - 1 x daily - 5 x weekly - 2 sets - 10 reps  GOALS: Goals reviewed with patient? Yes  SHORT TERM GOALS: Target date: 09/07/2022  Pt will be independent with strength and balance HEP to promote maintenance of gains outside the clinic.  Baseline:  To be provided Goal status: INITIAL   2. Therapist will  assess without AD / write LTG as indicated  Baseline: Assessed 8/7 w/ LTG set. Goal status: MET  LONG TERM GOALS: Target date: 01/13/2022  Pt to be independent with advance strength and balance HEP. Baseline: To be modified. Goal status: INITIAL  2.  Patient will improve gait speed to 0.8 m/s with LRAD to  indicate improvement to the level of community ambulator in order to participate more easily in activities outside of the home.   Baseline: 0.69 m/s with rubber tip quad cane Goal status: INITIAL  3.  Patient will improve ability to ascend/descend 2 x 4 stairs with SBA and single use of rail with step to pattern to improve access to community. Baseline: CGA x 4 stairs Goal status: INITIAL  4.  Pt will ambulate >/= 765 feet on to demonstrate improved functional endurance for home and community participation. Baseline: 665 ft CGA no AD (8/7) Goal status: INITIAL    ASSESSMENT:  CLINICAL IMPRESSION: Skilled PT session today initiated with assessment of with patient completing 665 feet at CGA level due to left foot scuff.  Initiated a functional strength and balance HEP that patient could perform safely in his temporary housing situation.  Will continue per POC as pt continues to benefit from PT interventions for safety with functional mobility.  OBJECTIVE IMPAIRMENTS Abnormal gait, decreased activity tolerance, decreased balance, decreased coordination, decreased endurance, decreased mobility, difficulty walking, decreased strength, decreased safety awareness, increased edema, impaired sensation, impaired tone, improper body mechanics, and postural dysfunction.   ACTIVITY LIMITATIONS carrying, lifting, stairs, transfers, and locomotion level  PARTICIPATION LIMITATIONS: community activity and occupation  PERSONAL FACTORS Age, Fitness, Past/current experiences, Time since onset of injury/illness/exacerbation, and 1-2 comorbidities: HTN, chronic CHF/cardiomyopathy  are also affecting patient's functional outcome.   REHAB POTENTIAL: Fair See personal factors  CLINICAL DECISION MAKING: Stable/uncomplicated  EVALUATION COMPLEXITY: Low  PLAN: PT FREQUENCY: 1x/week  PT DURATION: 6 weeks  PLANNED INTERVENTIONS: Therapeutic exercises, Therapeutic activity, Neuromuscular  re-education, Balance training, Gait training, Patient/Family education, Self Care, Joint mobilization, Stair training, Vestibular training, Orthotic/Fit training, DME instructions, Electrical stimulation, Manual therapy, and Re-evaluation  PLAN FOR NEXT SESSION: Modify HEP prn - dynamic balance, work on stairs, L NMR   Sadie Haber, PT, DPT 08/30/2022, 4:11 PM   Check all possible CPT codes: 40981 - PT Re-evaluation, 97110- Therapeutic Exercise, [phone]- Neuro Re-education, [phone] - Gait Training, [phone] - Manual Therapy, 97530 - Therapeutic Activities, and [phone] - Self Care    Check all conditions that are expected to impact treatment: {Conditions expected to impact treatment:Contractures, spasticity or fracture relevant to requested treatment and Neurological condition and/or seizures   If treatment provided at initial evaluation, no treatment charged due to lack of authorization.

## 2022-08-30 NOTE — Patient Instructions (Signed)
Access Code: [email or web address] URL: [email or web address] Date: 08/30/2022 Prepared by: Camille Bal  Exercises - Sit to/from Stand in Stride position  - 1 x daily - 5 x weekly - 1 sets - 10 reps - Standing Single Leg Stance with Counter Support  - 1 x daily - 5 x weekly - 1 sets - 3 reps - 10 hold - Heel Raises with Counter Support  - 1 x daily - 4-5 x weekly - 2 sets - 10 reps - Supine Bridge  - 1 x daily - 5 x weekly - 2 sets - 10 reps

## 2022-08-31 NOTE — Telephone Encounter (Signed)
Pt's medication was already sent to his pharmacy by his PCP. Confirmation received.

## 2022-09-04 NOTE — Therapy (Incomplete)
OUTPATIENT PHYSICAL THERAPY NEURO TREATMENT   Patient Name: [name] MRN: [id number] DOB:1965-02-10, 57 y.o., male Today's Date: 08/30/2022   PCP: Patient is between PCP due to insurance REFERRING PROVIDER: Horton Chin, MDP     PT End of Session - 08/30/22 1525     Visit Number 2    Number of Visits 7   including eval   Date for PT Re-Evaluation 10/19/22   written longer to accomodate schedule fullness   Authorization Type [place] Medicaid    PT Start Time 1520    PT Stop Time 1611    PT Time Calculation (min) 51 min    Equipment Utilized During Treatment Gait belt    Activity Tolerance Patient tolerated treatment well    Behavior During Therapy WFL for tasks assessed/performed            Past Medical History:  Diagnosis Date  . Anxiety   . Cataract    removed left eye  . Chronic combined systolic and diastolic CHF (congestive heart failure) (HCC)    EF 25-30%  . Diastolic dysfunction   . Dyslipidemia   . Hyperlipidemia   . Hypertension   . Neuromuscular disorder (HCC)    neuropathy left foot  . Nonischemic dilated cardiomyopathy (HCC)    felt secondary to HTN with no ischemia on nuclear stress test 05/2012, EF 25-30%  . Seizures (HCC)    last seizure 05-2020- on Keppra  . Shortness of breath   . Stroke [facility]) 05/2020   Past Surgical History:  Procedure Laterality Date  . COLONOSCOPY WITH PROPOFOL N/A 12/09/2020   Procedure: COLONOSCOPY WITH PROPOFOL;  Surgeon: Benancio Deeds, MD;  Location: [facility];  Service: Gastroenterology;  Laterality: N/A;  . KNEE ARTHROSCOPY Left 04/13/2016   Procedure: ARTHROSCOPY KNEE WITH DEBRIDEMENT;  Surgeon: Valeria Batman, MD;  Location: [facility];  Service: Orthopedics;  Laterality: Left;  . KNEE ARTHROSCOPY WITH MEDIAL MENISECTOMY Right 04/13/2016   Procedure: KNEE ARTHROSCOPY WITH MEDIAL MENISECTOMY;  Surgeon: Valeria Batman, MD;  Location: [facility];  Service: Orthopedics;   Laterality: LEFT not right knee  . NASAL FRACTURE SURGERY    . SHOULDER SURGERY Left    Patient Active Problem List   Diagnosis Date Noted  . Chronic diastolic CHF (congestive heart failure) (HCC) 11/07/2021  . Left ventricular hypertrophy 11/07/2021  . Osteoarthritis of left glenohumeral joint 05/04/2021  . Colon cancer screening   . Left hemiplegia (HCC) 07/02/2020  . Cognitive deficit due to recent stroke 07/02/2020  . Dysphagia, post-stroke   . Vascular headache   . Acute blood loss anemia   . AKI (acute kidney injury) (HCC)   . Essential hypertension   . Seizures (HCC)   . 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 ICH (intracerebral hemorrhage) (HCC) 06/09/2020  . Sprain of anterior talofibular ligament of left ankle 01/15/2020  . Hyperlipidemia, on Lipitor 04/01/2017  . Alcohol use disorder, mild, abuse 06/30/2016  . Morbid obesity (HCC) 06/30/2016  . 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 HTN (hypertension) 05/25/2012    ONSET DATE: 10/31/2021   REFERRING DIAG: I61.9 (ICD-10-CM) - Nontraumatic thalamic hemorrhage (HCC) G81.94 (ICD-10-CM) - Left hemiparesis (HCC)   THERAPY DIAG:  Other abnormalities of gait and mobility  Unsteadiness on feet  Muscle weakness (generalized)  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-dominant side (HCC)  Other lack of coordination  Rationale for Evaluation and Treatment Rehabilitation  SUBJECTIVE:                                                                                                                                                                                             SUBJECTIVE STATEMENT:  Pt ambulates into clinic using rubber tip SPC w/o left AFO.  He has several instances of toe scuff requiring close SBA-CGA to safely reach mat table.  He endorses ongoing tingling in left side.   He denies recent falls or acute changes.  Pt accompanied by: self  PERTINENT HISTORY: HTN, Noischemic dilated cardiomyopathy, chronic combined CHF, ETOH, hyperlipidemia, ICH, seizures, CVA 06/09/2020  PAIN:  Are you having pain? Yes: NPRS scale: 5/10 Pain location: left side Pain description: pulsating, needles Aggravating factors: no movement Relieving factors: walking  PRECAUTIONS: Fall  WEIGHT BEARING RESTRICTIONS No  FALLS: Has patient fallen in last 6 months? Yes. Number of falls 1-he was holding a door and LOB, was able to get up by self.  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 Expected reduction in size of hemorrhage compared to 06/19/20. No acute or new findings.  COGNITION: Overall cognitive status: Impaired   SENSATION: Light touch: Impaired  and decreased sensation on L side  COORDINATION: Dysmetric with heel to shin on LLE  EDEMA:  WFL  MUSCLE TONE: LLE spasticity noted during ambulation, mild catch in seated  POSTURE: forward head  LOWER EXTREMITY ROM:     L hemiparetic side = mild decreased hip extension, dorsiflexion, and increased hamstring tightness (varies given tone)  LOWER EXTREMITY MMT:    MMT Right Eval Left Eval  Hip flexion 4+/5 3+/5  Hip extension    Hip abduction 4/5 3+/5  Hip adduction 4+/5 3+/5  Hip internal rotation    Hip external rotation    Knee flexion 5/5 4-/5  Knee extension 5/5 4+/5  Ankle  dorsiflexion 5/5 3-/5  Ankle plantarflexion    Ankle inversion    Ankle eversion    (Blank rows = not tested)  BED MOBILITY:  Sit to supine Complete Independence Supine to sit Complete  Independence Pt reports independence.  TRANSFERS: Assistive device utilized:  Writer tip cane   Sit to stand: SBA Stand to sit: SBA Chair to chair: SBA  GAIT: Gait pattern: decreased hip/knee flexion- Left, decreased ankle dorsiflexion- Left, decreased trunk rotation, wide BOS, abducted- Left, and poor foot clearance- Left Distance walked: various clinic distances Assistive device utilized: small rubber tip cane, none Level of assistance: SBA Comments: Compensates well both with/without AD, decreased LE on L but no major difference noted over short distances in today's session   STAIRS: Level of Assistance: CGA Stair Negotiation Technique: Step to Pattern with Single Rail on Right Number of Stairs: 4  Height of Stairs: 6"  Comments: CGA with min signs of instability, steps up with right and down with L  FUNCTIONAL TESTs:     08/15/22 0001  Standardized Balance Assessment  Standardized Balance Assessment Five Times Sit to Stand;TUG;10 meter walk test  Five times sit to stand comments  11.20 (seconds without UE use (bias towards RLE))  10 Meter Walk 0.69 (m/s with SPC quad tip cane with SBA (0.67 m/s without AD with SBA))    PATIENT SURVEYS:  FOTO not applicable due to chronic nature of CVA  TODAY'S TREATMENT:  08/30/2022: - w/o AD:  665 ft CGA  -Staggered STS x10 w/ LLE in rear SBA, pt does well without hand support -PT ambulates patient to and from counter x25' each way having patient practice tight spaces and obstacles negotiation w/ LLE placement.  CGA. At counter:  -Standing heel raises 2x10, cued for eccentric lower  -SLS 2x35 seconds each LE, pt instructed to improve upright posture when practicing at home  -Supine single leg bridge (attempted w/ pt unable to lift  creating vertical sliding), regressed to supine bridge x10  PATIENT EDUCATION: Education details: If patient has access to his left AFO from prior PT appts then please bring it to sessions- he is unsure if he has it due to leaving several belongings behind in separation.  He does state he did not want to wear it as he did not like having it in his shoe, PT provided education on catching foot and fall risk without some form of support for left foot.  Reminder of appt with orthopedic doctor for pain in right knee and follow-up with [facility] 9/17 as pt has appts mixed up.  Informed pt of scheduled OT eval 8/21.  Provided reprint of updated scheduled visits.  Initial HEP and places to set up to practice in temporary housing situation. Person educated: Patient Education method: Explanation Education comprehension: verbalized understanding and needs further education   HOME EXERCISE PROGRAM:  Access Code: [email or web address] URL: [email or web address] Date: 08/30/2022 Prepared by: Camille Bal  Exercises - Sit to/from Stand in Stride position  - 1 x daily - 5 x weekly - 1 sets - 10 reps - Standing Single Leg Stance with Counter Support  - 1 x daily - 5 x weekly - 1 sets - 3 reps - 10 hold - Heel Raises with Counter Support  - 1 x daily - 4-5 x weekly - 2 sets - 10 reps - Supine Bridge  - 1 x daily - 5 x weekly - 2 sets - 10 reps  GOALS: Goals reviewed with patient? Yes  SHORT TERM GOALS: Target date: 09/07/2022  Pt will be independent with strength and balance HEP to promote maintenance of gains outside the clinic.  Baseline:  To be provided Goal status: INITIAL   2. Therapist will assess  without AD / write LTG as indicated  Baseline: Assessed 8/7 w/ LTG set. Goal status: MET  LONG TERM GOALS: Target date: 01/13/2022  Pt to be independent with advance strength and balance HEP. Baseline: To be modified. Goal status: INITIAL  2.  Patient will improve gait speed to 0.8 m/s  with LRAD to indicate improvement to the level of community ambulator in order to participate more easily in activities outside of the home.   Baseline: 0.69 m/s with rubber tip quad cane Goal status: INITIAL  3.  Patient will improve ability to ascend/descend 2 x 4 stairs with SBA and single use of rail with step to pattern to improve access to community. Baseline: CGA x 4 stairs Goal status: INITIAL  4.  Pt will ambulate >/= 765 feet on to demonstrate improved functional endurance for home and community participation. Baseline: 665 ft CGA Goal status: INITIAL    ASSESSMENT:  CLINICAL IMPRESSION: Patient is a 57 y.o. male who was seen today for physical therapy evaluation and treatment for gait abnormalities and chronic left hemiparesis related to prior CVA in May 2022.  Pt has a significant PMH of HTN, Noischemic dilated cardiomyopathy, chronic combined CHF, ETOH, hyperlipidemia, ICH, and seizures. Patient presents with deficits in gait speed and stair negotiation. Patient demonstrates progression to single point large rubber tip cane and is hoping to progress to ambulating without AD. Patient able to tolerate short distances well in today's session without AD but will continue to progress endurance. Patient requires CGA on stairs at this time. He would benefit from skilled PT to address impairments as noted and progress towards long term goals.  OBJECTIVE IMPAIRMENTS Abnormal gait, decreased activity tolerance, decreased balance, decreased coordination, decreased endurance, decreased mobility, difficulty walking, decreased strength, decreased safety awareness, increased edema, impaired sensation, impaired tone, improper body mechanics, and postural dysfunction.   ACTIVITY LIMITATIONS carrying, lifting, stairs, transfers, and locomotion level  PARTICIPATION LIMITATIONS: community activity and occupation  PERSONAL FACTORS Age, Fitness, Past/current experiences, Time since onset of  injury/illness/exacerbation, and 1-2 comorbidities: HTN, chronic CHF/cardiomyopathy  are also affecting patient's functional outcome.   REHAB POTENTIAL: Fair See personal factors  CLINICAL DECISION MAKING: Stable/uncomplicated  EVALUATION COMPLEXITY: Low  PLAN: PT FREQUENCY: 1x/week  PT DURATION: 6 weeks  PLANNED INTERVENTIONS: Therapeutic exercises, Therapeutic activity, Neuromuscular re-education, Balance training, Gait training, Patient/Family education, Self Care, Joint mobilization, Stair training, Vestibular training, Orthotic/Fit training, DME instructions, Electrical stimulation, Manual therapy, and Re-evaluation  PLAN FOR NEXT SESSION: Assess without AD, update former HEP, work on stairs, staggered sit to stands; new OT referral?   Sadie Haber, PT, DPT 08/30/2022, 4:11 PM   Check all possible CPT codes: 16109 - PT Re-evaluation, 97110- Therapeutic Exercise, [phone]- Neuro Re-education, [phone] - Gait Training, [phone] - Manual Therapy, 97530 - Therapeutic Activities, and 97535 - Self Care    Check all conditions that are expected to impact treatment: {Conditions expected to impact treatment:Contractures, spasticity or fracture relevant to requested treatment and Neurological condition and/or seizures   If treatment provided at initial evaluation, no treatment charged due to lack of authorization.

## 2022-09-07 ENCOUNTER — Encounter: Payer: Self-pay | Admitting: Physical Therapy

## 2022-09-07 ENCOUNTER — Ambulatory Visit: Payer: Medicaid Other | Admitting: Physical Therapy

## 2022-09-07 DIAGNOSIS — R2689 Other abnormalities of gait and mobility: Secondary | ICD-10-CM

## 2022-09-07 DIAGNOSIS — I69354 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-dominant side: Secondary | ICD-10-CM

## 2022-09-07 DIAGNOSIS — M6281 Muscle weakness (generalized): Secondary | ICD-10-CM

## 2022-09-07 NOTE — Therapy (Addendum)
OUTPATIENT PHYSICAL THERAPY NEURO TREATMENT   Patient Name: [name] MRN: [id number] DOB:[birth year], 57 y.o., male Today's Date: 09/07/2022   PCP: Patient is between PCP due to insurance REFERRING PROVIDER: Horton Chin, MDP     PT End of Session - 09/07/22 1553     Visit Number 3    Number of Visits 7   including eval   Date for PT Re-Evaluation 10/19/22   written longer to accomodate schedule fullness   Authorization Type  Medicaid    Authorization Time Period 08-30-22 - 09-28-22 - 4 visits approved    PT Start Time 1105    PT Stop Time 1150    PT Time Calculation (min) 45 min    Equipment Utilized During Treatment Gait belt    Activity Tolerance Patient tolerated treatment well    Behavior During Therapy WFL for tasks assessed/performed             Past Medical History:  Diagnosis Date   Anxiety    Cataract    removed left eye   Chronic combined systolic and diastolic CHF (congestive heart failure) (HCC)    EF 25-30%   Diastolic dysfunction    Dyslipidemia    Hyperlipidemia    Hypertension    Neuromuscular disorder (HCC)    neuropathy left foot   Nonischemic dilated cardiomyopathy (HCC)    felt secondary to HTN with no ischemia on nuclear stress test 05/2012, EF 25-30%   Seizures (HCC)    last seizure 05-2020- on Keppra   Shortness of breath    Stroke (HCC) 05/2020   Past Surgical History:  Procedure Laterality Date   COLONOSCOPY WITH PROPOFOL N/A 12/09/2020   Procedure: COLONOSCOPY WITH PROPOFOL;  Surgeon: Benancio Deeds, MD;  Location: [facility];  Service: Gastroenterology;  Laterality: N/A;   KNEE ARTHROSCOPY Left 04/13/2016   Procedure: ARTHROSCOPY KNEE WITH DEBRIDEMENT;  Surgeon: Valeria Batman, MD;  Location: [facility];  Service: Orthopedics;  Laterality: Left;   KNEE ARTHROSCOPY WITH MEDIAL MENISECTOMY Right 04/13/2016   Procedure: KNEE ARTHROSCOPY WITH MEDIAL MENISECTOMY;  Surgeon: Valeria Batman, MD;   Location: [facility];  Service: Orthopedics;  Laterality: LEFT not right knee   NASAL FRACTURE SURGERY     SHOULDER SURGERY Left    Patient Active Problem List   Diagnosis Date Noted   Chronic diastolic CHF (congestive heart failure) (HCC) 11/07/2021   Left ventricular hypertrophy 11/07/2021   Osteoarthritis of left glenohumeral joint 05/04/2021   Colon cancer screening    Left hemiplegia (HCC) 07/02/2020   Cognitive deficit due to recent stroke 07/02/2020   Dysphagia, post-stroke    Vascular headache    Acute blood loss anemia    AKI (acute kidney injury) (HCC)    Essential hypertension    Seizures (HCC)    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   ICH (intracerebral hemorrhage) (HCC) 06/09/2020   Sprain of anterior talofibular ligament of left ankle 01/15/2020   Hyperlipidemia, on Lipitor 04/01/2017   Alcohol use disorder, mild, abuse 06/30/2016   Morbid obesity (HCC) 06/30/2016   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   Chronic combined systolic and diastolic CHF (congestive heart failure) (HCC) 11/08/2012   HTN (hypertension) 05/25/2012    ONSET DATE: 10/31/2021   REFERRING DIAG: I61.9 (ICD-10-CM) - Nontraumatic thalamic hemorrhage (HCC) G81.94 (ICD-10-CM) - Left hemiparesis (HCC)   THERAPY DIAG:  Other abnormalities of gait and mobility  Muscle weakness (generalized)  Hemiplegia and hemiparesis following cerebral infarction affecting  left non-dominant side (HCC)  Rationale for Evaluation and Treatment Rehabilitation  SUBJECTIVE:                                                                                                                                                                                             SUBJECTIVE STATEMENT:       Pt reports no major changes since treatment session last week; states his left leg is stiff due to sitting for a while.  Pt reports his PCP has referred him to an orthopedic doctor due to his left elbow  pain secondary to fall sustained a while back.  Pt states he does not have his AFO for LLE - was left at his house when he moved out.  Pt accompanied by: self  PERTINENT HISTORY: HTN, Noischemic dilated cardiomyopathy, chronic combined CHF, ETOH, hyperlipidemia, ICH, seizures, CVA 06/09/2020  PAIN:  Are you having pain? Yes: NPRS scale: 5/10 Pain location: left side Pain description: pulsating, needles Aggravating factors: no movement Relieving factors: walking  PRECAUTIONS: Fall  WEIGHT BEARING RESTRICTIONS No  FALLS: Has patient fallen in last 6 months? Yes. Number of falls 1-he was holding a door and LOB, was able to get up by self.  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 Expected reduction in size of hemorrhage compared to 06/19/20. No acute or new findings.  COGNITION: Overall cognitive status: Impaired   SENSATION: Light touch: Impaired  and decreased sensation on L side  COORDINATION: Dysmetric with heel to shin on LLE  EDEMA:  WFL  MUSCLE TONE: LLE spasticity noted during ambulation, mild catch in seated  POSTURE: forward head  LOWER EXTREMITY ROM:     L hemiparetic side = mild decreased hip extension, dorsiflexion, and increased hamstring tightness (varies given tone)  LOWER EXTREMITY MMT:    MMT Right Eval Left Eval  Hip flexion 4+/5 3+/5  Hip extension    Hip abduction 4/5 3+/5  Hip adduction 4+/5 3+/5  Hip internal  rotation    Hip external rotation    Knee flexion 5/5 4-/5  Knee extension 5/5 4+/5  Ankle dorsiflexion 5/5  3-/5  Ankle plantarflexion    Ankle inversion    Ankle eversion    (Blank rows = not tested)  BED MOBILITY:  Sit to supine Complete Independence Supine to sit Complete Independence Pt reports independence.  TRANSFERS: Assistive device utilized:  Writer tip cane   Sit to stand: SBA Stand to sit: SBA Chair to chair: SBA  GAIT: Gait pattern: decreased hip/knee flexion- Left, decreased ankle dorsiflexion- Left, decreased trunk rotation, wide BOS, abducted- Left, and poor foot clearance- Left Distance walked: various clinic distances Assistive device utilized: small rubber tip cane, none Level of assistance: SBA Comments: Compensates well both with/without AD, decreased LE on L but no major difference noted over short distances in today's session   STAIRS: Level of Assistance: CGA Stair Negotiation Technique: Step to Pattern with Single Rail on Right Number of Stairs: 4  Height of Stairs: 6"  Comments: CGA with min signs of instability, steps up with right and down with L  FUNCTIONAL TESTs:     08/15/22 0001  Standardized Balance Assessment  Standardized Balance Assessment Five Times Sit to Stand;TUG;10 meter walk test  Five times sit to stand comments  11.20 (seconds without UE use (bias towards RLE))  10 Meter Walk 0.69 (m/s with SPC quad tip cane with SBA (0.67 m/s without AD with SBA))    PATIENT SURVEYS:  FOTO not applicable due to chronic nature of CVA  TODAY'S TREATMENT:  09-07-22:  Standing Lt hamstring stretch with Rt foot on step - 30 sec hold x 1 rep Lt heel cord stretch with heel off edge of step - 20 sec hold x 1 rep Sit to stand transfers from mat with Rt foot on 4" step 10 reps with min assist  Lt step up exercise 10 reps onto 6" step with RUE support on rail Seated Lt knee flexion with red theraband 10 reps; Lt knee extension with red  theraband 10 reps in seated position Seated Lt dorsiflexion with red theraband 10 reps with 3 sec hold Pt transferred from sit to prone position with CGA - reported no pain in LUE; performed Lt knee flexion in prone 10 reps with assistance at end ROM for stretching  SciFit level 2.0 with UE's and LE's x 7"   Access Code: [email or web address] URL: [email or web address] Date: 09/07/2022 Prepared by: Maebelle Munroe  Exercises - Seated Knee Flexion with Anchored Resistance  - 1 x daily - 7 x weekly - 2 sets - 10 reps - 2-3 sec hold - Seated Knee Extension with Resistance  - 1 x daily - 7 x weekly - 2 sets - 10 reps - 3 sec hold - Seated Ankle Dorsiflexion with Resistance  - 1 x daily - 7 x weekly - 2 sets - 10 reps - 3 sec hold  PATIENT EDUCATION: Education details: Medbridge Access Code: [email or web address] Person educated: Patient Education method: Explanation, demonstration and handout Education comprehension: verbalized understanding and needs further education   HOME EXERCISE PROGRAM:  Access Code: [email or web address] URL: [email or web address] Date: 08/30/2022 Prepared by: Camille Bal  Exercises - Sit to/from Stand in Stride position  - 1 x daily - 5 x weekly - 1 sets - 10 reps - Standing Single Leg Stance with Counter Support  - 1 x daily - 5 x weekly - 1 sets - 3 reps - 10 hold - Heel Raises with Counter Support  - 1 x daily - 4-5 x weekly - 2 sets - 10 reps - Supine Bridge  - 1 x daily -  5 x weekly - 2 sets - 10 reps  GOALS: Goals reviewed with patient? Yes  SHORT TERM GOALS: Target date: 09/07/2022  Pt will be independent with strength and balance HEP to promote maintenance of gains outside the clinic.  Baseline:  To be provided Goal status: INITIAL   2. Therapist will assess without AD / write LTG as indicated  Baseline: Assessed 8/7 w/ LTG set. Goal status: MET  LONG TERM GOALS: Target date: 10-19-22  Pt to be independent with advance strength and balance  HEP. Baseline: To be modified. Goal status: INITIAL  2.  Patient will improve gait speed to 0.8 m/s with LRAD to indicate improvement to the level of community ambulator in order to participate more easily in activities outside of the home.   Baseline: 0.69 m/s with rubber tip quad cane Goal status: INITIAL  3.  Patient will improve ability to ascend/descend 2 x 4 stairs with SBA and single use of rail with step to pattern to improve access to community. Baseline: CGA x 4 stairs Goal status: INITIAL  4.  Pt will ambulate >/= 765 feet on to demonstrate improved functional endurance for home and community participation. Baseline: 665 ft CGA no AD (8/7) Goal status: INITIAL    ASSESSMENT:  CLINICAL IMPRESSION: PT session focused on LLE strengthening and stretching for Lt hamstrings and heel cord.  Pt was issued HEP for strengthening exercises for Lt hamstring, quads, and dorsiflexors with red theraband given to pt for PRE's.  Pt tolerated SciFit exercise well, reporting the movement felt good to his LUE.  Cont with POC.  OBJECTIVE IMPAIRMENTS Abnormal gait, decreased activity tolerance, decreased balance, decreased coordination, decreased endurance, decreased mobility, difficulty walking, decreased strength, decreased safety awareness, increased edema, impaired sensation, impaired tone, improper body mechanics, and postural dysfunction.   ACTIVITY LIMITATIONS carrying, lifting, stairs, transfers, and locomotion level  PARTICIPATION LIMITATIONS: community activity and occupation  PERSONAL FACTORS Age, Fitness, Past/current experiences, Time since onset of injury/illness/exacerbation, and 1-2 comorbidities: HTN, chronic CHF/cardiomyopathy  are also affecting patient's functional outcome.   REHAB POTENTIAL: Fair See personal factors  CLINICAL DECISION MAKING: Stable/uncomplicated  EVALUATION COMPLEXITY: Low  PLAN: PT FREQUENCY: 1x/week  PT DURATION: 6 weeks  PLANNED  INTERVENTIONS: Therapeutic exercises, Therapeutic activity, Neuromuscular re-education, Balance training, Gait training, Patient/Family education, Self Care, Joint mobilization, Stair training, Vestibular training, Orthotic/Fit training, DME instructions, Electrical stimulation, Manual therapy, and Re-evaluation  PLAN FOR NEXT SESSION: Check HEP issued on 09-07-22:  work on stairs, L NMR   Kary Kos, PT 09/07/2022, 3:55 PM   Check all possible CPT codes: 95621 - PT Re-evaluation, 97110- Therapeutic Exercise, [phone]- Neuro Re-education, [phone] - Gait Training, [phone] - Manual Therapy, 97530 - Therapeutic Activities, and [phone] - Self Care    Check all conditions that are expected to impact treatment: {Conditions expected to impact treatment:Contractures, spasticity or fracture relevant to requested treatment and Neurological condition and/or seizures   If treatment provided at initial evaluation, no treatment charged due to lack of authorization.

## 2022-09-12 NOTE — Therapy (Unsigned)
OUTPATIENT OCCUPATIONAL THERAPY NEURO EVALUATION  Patient Name: [name] MRN: [id number] DOB:[birth year], 57 y.o., male Today's Date: 09/13/2022  PCP: Rema Fendt, NP REFERRING PROVIDER: Horton Chin, MD    OT End of Session - 09/13/22 1017     Visit Number 1    Number of Visits 8    Date for OT Re-Evaluation 11/10/22    Authorization Type [place] Medicaid - requires auth    OT Start Time 1019    OT Stop Time 1100    OT Time Calculation (min) 41 min    Activity Tolerance Patient tolerated treatment well    Behavior During Therapy WFL for tasks assessed/performed              Past Medical History:  Diagnosis Date   Anxiety    Cataract    removed left eye   Chronic combined systolic and diastolic CHF (congestive heart failure) (HCC)    EF 25-30%   Diastolic dysfunction    Dyslipidemia    Hyperlipidemia    Hypertension    Neuromuscular disorder (HCC)    neuropathy left foot   Nonischemic dilated cardiomyopathy (HCC)    felt secondary to HTN with no ischemia on nuclear stress test 05/2012, EF 25-30%   Seizures (HCC)    last seizure 05-2020- on Keppra   Shortness of breath    Stroke (HCC) 05/2020   Past Surgical History:  Procedure Laterality Date   COLONOSCOPY WITH PROPOFOL N/A 12/09/2020   Procedure: COLONOSCOPY WITH PROPOFOL;  Surgeon: Benancio Deeds, MD;  Location: [facility];  Service: Gastroenterology;  Laterality: N/A;   KNEE ARTHROSCOPY Left 04/13/2016   Procedure: ARTHROSCOPY KNEE WITH DEBRIDEMENT;  Surgeon: Valeria Batman, MD;  Location: [facility];  Service: Orthopedics;  Laterality: Left;   KNEE ARTHROSCOPY WITH MEDIAL MENISECTOMY Right 04/13/2016   Procedure: KNEE ARTHROSCOPY WITH MEDIAL MENISECTOMY;  Surgeon: Valeria Batman, MD;  Location: [facility];  Service: Orthopedics;  Laterality: LEFT not right knee   NASAL FRACTURE SURGERY     SHOULDER SURGERY Left    Patient Active Problem List    Diagnosis Date Noted   Chronic diastolic CHF (congestive heart failure) (HCC) 11/07/2021   Left ventricular hypertrophy 11/07/2021   Osteoarthritis of left glenohumeral joint 05/04/2021   Colon cancer screening    Left hemiplegia (HCC) 07/02/2020   Cognitive deficit due to recent stroke 07/02/2020   Dysphagia, post-stroke    Vascular headache    Acute blood loss anemia    AKI (acute kidney injury) (HCC)    Essential hypertension    Seizures (HCC)    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   ICH (intracerebral hemorrhage) (HCC) 06/09/2020   Sprain of anterior talofibular ligament of left ankle 01/15/2020   Hyperlipidemia, on Lipitor 04/01/2017   Alcohol use disorder, mild, abuse 06/30/2016   Morbid obesity (HCC) 06/30/2016   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   Chronic combined systolic and diastolic CHF (congestive heart failure) (HCC) 11/08/2012   HTN (hypertension) 05/25/2012    ONSET DATE: 06/08/20   REFERRING DIAG: G81.94 (ICD-10-CM) - Left hemiparesis  THERAPY DIAG:  Muscle weakness (generalized)  Other lack of coordination  Chronic left shoulder pain  Hemiplegia and hemiparesis following nontraumatic intracerebral hemorrhage affecting left non-dominant side (HCC)  Other disturbances of skin sensation  Rationale for Evaluation and Treatment Rehabilitation  SUBJECTIVE:   SUBJECTIVE STATEMENT: Pt reports he spoke with CJ about getting a (Zynex?) device for his LUE paresthesias and  pain.   Pt accompanied by: self  PERTINENT HISTORY: Update 10/31/2021 JM: Patient returns for follow-up after prior visit 7 months ago regarding history of ICH, seizures and headaches.  He is unaccompanied.  Reports residual left sided weakness and nerve pain. Was previously just in foot, but now going up his whole leg to his hip.  At prior visit with PMR, recommended trialing different medications but he wishes to try to avoid medications if able.  He questions restarting  therapies.  Continues to ambulate with a cane, denies any recent falls.  Does mention left side of face and tongue feels swollen over the past 2 weeks, denies any change in diet or medications.  Denies any difficulty breathing or swallowing. Compliant on Keppra, no seizure activity. Has not had any recent headaches, currently on topiramate 50 mg twice daily. On atorvastatin, denies side effects.  Blood pressure well controlled, today 112/65.  Is being seen by cardiology next week for further blood pressure management.  No further concerns at this time.  PRECAUTIONS: Fall  WEIGHT BEARING RESTRICTIONS: No  PAIN:  Are you having pain? Yes: NPRS scale: 5/10 Pain location: L side of body Pain description: tingling Aggravating factors: cold Relieving factors: heat  FALLS: Has patient fallen in last 6 months? Yes. Number of falls 1  LIVING ENVIRONMENT: Lives with: [facility] Lives in: one level dorm Stairs: No Has following equipment at home: Quad cane small base, Hemi walker, Wheelchair (manual), and Shower chair (no back)  PLOF: Requires assistive device for independence and Vocation/Vocational requirements: pt is on disability  PATIENT GOALS: improve use of LUE and overall independence with completion of functional activities.  OBJECTIVE:   MRI results:  1. Mild-moderate rotator cuff tendinosis without tear. 2. Severe intra-articular biceps tendinosis with mild tenosynovitis. 3. 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  HAND DOMINANCE: Right  ADLs: Eating: needs assist to cut food Grooming: mod I  UB Dressing: mod I, difficulty w/ buttons LB Dressing: assist to tie shoes Toileting: mod I  Bathing: mod I w/ difficulty getting under Rt arm Tub Shower transfers: mod I  Equipment: Shower seat with back  IADLs: Shopping: mod independent Light housekeeping: mod I  Meal Prep: uses air fryer, [place] [employer], microwave Community mobility: drives  I'ly Medication management: mod I  Handwriting:  denies change  MOBILITY STATUS: Independent; uses quad cane; increased time   ACTIVITY TOLERANCE: Activity tolerance: good  FUNCTIONAL OUTCOME MEASURES: Quick Dash: 63.6 % disability with use of LUE   UPPER EXTREMITY ROM     AROM Right eval Left eval  Shoulder flexion WNL 120* with ataxia  Shoulder abduction WNL 90* with ataxia reports limiting ROM in this direction as his shoulder pops out of socket  Elbow flexion WNL 132*  Elbow extension WNL -36*  Wrist flexion WNL WFL  Wrist extension WNL N/A past neutral  Wrist pronation WNL WFL  Wrist supination WNL 60* with time  (Blank rows = not tested)  L Digit flex and ext: [facility]; Digit opposition: BFL, very ataxic  UPPER EXTREMITY MMT:     MMT Right eval Left eval  Shoulder flexion WNL WFL within available ROM  Shoulder abduction WNL "  Elbow flexion WNL WNL  Elbow extension WNL WNL  (Blank rows = not tested)  HAND FUNCTION: Grip strength: Right: 110.6 lbs; Left: 28.4 lbs  COORDINATION: Box and Blocks:  Right 47blocks, Left 8blocks  SENSATION: Light touch: abscent Proprioception: Impaired   EDEMA: none reported or observed  MUSCLE TONE: LUE: Mild and Hypertonic  COGNITION: Overall cognitive status: Within functional limits for tasks assessed  VISION: Subjective report: No change Baseline vision: Wears glasses for reading only Visual history: corrective eye surgery  VISION ASSESSMENT: Not tested - denies change   OBSERVATIONS: Pt holds LUE in guarded positioning to L side of body. No volitional use with completion of functional activities.    TODAY'S TREATMENT:                                                                          N/A this visit  PATIENT EDUCATION: Education details: OT POC Person educated: Patient Education method: Explanation Education comprehension: verbalized understanding and needs further education  HOME EXERCISE  PROGRAM: N/A This visit  GOALS:  SHORT TERM GOALS: Target date: 10/11/2022    Patient will demonstrate LUE ROM HEP with 25% verbal cues or less for proper execution. Baseline: Does not have handouts Goal status: INITIAL  2.  Pt will demonstrate ability to pick up 9 hole peg and place into board in under 50 seconds Baseline: lacks coordination due to ataxia and limited opposition Goal status: INITIAL  3.  Pt to verbalize understanding of safety considerations d/t sensory loss LUE Baseline: Not yet initiated Goal status: INITIAL   LONG TERM GOALS: Target date:  11/10/2022  1. Patient will demonstrate updated LUE HEP with 25% verbal cues or less for proper execution.  Baseline: Not yet initiated Goal status: INITIAL  2.  Patient will demonstrate at least 16%  improvement with quick Dash score (reporting 47.6% disability or less) indicating improved functional use of affected extremity.  Baseline: 63.6 % disability with use of LUE Goal status: INITIAL  3.  Will improve Box and Blocks by at least 4 blocks.  Baseline: 8 blocks Goal status: INITIAL   4.  Pt will perform home management and stovetop based meal prep with good safety awareness and use of modifications/DME as needed.   Baseline: uses appliances Goal status: INITIAL   ASSESSMENT:  CLINICAL IMPRESSION: Patient is a 57 y.o. male who was seen today for occupational therapy evaluation for L hemiparesis following CVA. Hx includes cataract extraction (OU), anxiety, CHF, HTN, neuropathy of L foot, and seizures. Patient currently presents below baseline level of functioning demonstrating functional deficits and impairments as noted below. Pt would benefit from skilled OT services in the outpatient setting to work on impairments as noted above to help pt return to help improve independence and safety with ADLs and IADLs as pt will be moving into a place on his own.   PERFORMANCE DEFICITS: in functional skills including  ADLs, IADLs, coordination, sensation, ROM, strength, pain, Fine motor control, Gross motor control, mobility, balance, and UE functional use.  IMPAIRMENTS: are limiting patient from ADLs, IADLs, and leisure.   CO-MORBIDITIES; may have co-morbidities  that affects occupational performance. Patient will benefit from skilled OT to address above impairments and improve overall function.  MODIFICATION OR ASSISTANCE TO COMPLETE EVALUATION: No modification of tasks or assist necessary to complete an evaluation.  OT OCCUPATIONAL PROFILE AND HISTORY: Problem focused assessment: Including review of records relating to presenting problem.  CLINICAL DECISION MAKING: LOW - limited treatment options, no task modification necessary  REHAB  POTENTIAL: Fair secondary to insurance visit limits and limited caregiver assistance/home environment  EVALUATION COMPLEXITY: Low    PLAN:  OT FREQUENCY: 1x/week   OT DURATION: 7 weeks   PLANNED INTERVENTIONS: self care/ADL training, therapeutic exercise, therapeutic activity, neuromuscular re-education, manual therapy, functional mobility training, electrical stimulation, patient/family education, DME and/or AE instructions, and Re-evaluation  RECOMMENDED OTHER SERVICES: n/a  CONSULTED AND AGREED WITH PLAN OF CARE: Patient  PLAN FOR NEXT SESSION: Review HEP to put in binder (reprint)   Delana Meyer, OT 09/13/2022, 12:37 PM   Check all possible CPT codes: 16109 - OT Re-evaluation, 97110- Therapeutic Exercise, [phone]- Neuro Re-education, 97140 - Manual Therapy, 97530 - Therapeutic Activities, 97535 - Self Care, and [phone] - Electrical stimulation (Manual)    Check all conditions that are expected to impact treatment: {Conditions expected to impact treatment:Musculoskeletal disorders, Neurological condition and/or seizures, Psychological or psychiatric disorders, and Social determinants of health   If treatment provided at initial evaluation, no treatment  charged due to lack of authorization.

## 2022-09-13 ENCOUNTER — Ambulatory Visit: Payer: Medicaid Other | Admitting: Physical Therapy

## 2022-09-13 ENCOUNTER — Ambulatory Visit: Payer: Medicaid Other | Admitting: Occupational Therapy

## 2022-09-13 ENCOUNTER — Encounter: Payer: Self-pay | Admitting: Physical Therapy

## 2022-09-13 VITALS — BP 120/63 | HR 72

## 2022-09-13 DIAGNOSIS — I69354 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-dominant side: Secondary | ICD-10-CM

## 2022-09-13 DIAGNOSIS — G8929 Other chronic pain: Secondary | ICD-10-CM

## 2022-09-13 DIAGNOSIS — R2689 Other abnormalities of gait and mobility: Secondary | ICD-10-CM

## 2022-09-13 DIAGNOSIS — I69154 Hemiplegia and hemiparesis following nontraumatic intracerebral hemorrhage affecting left non-dominant side: Secondary | ICD-10-CM

## 2022-09-13 DIAGNOSIS — R208 Other disturbances of skin sensation: Secondary | ICD-10-CM

## 2022-09-13 DIAGNOSIS — R278 Other lack of coordination: Secondary | ICD-10-CM

## 2022-09-13 DIAGNOSIS — M6281 Muscle weakness (generalized): Secondary | ICD-10-CM

## 2022-09-13 DIAGNOSIS — R2681 Unsteadiness on feet: Secondary | ICD-10-CM

## 2022-09-13 NOTE — Therapy (Signed)
OUTPATIENT PHYSICAL THERAPY NEURO TREATMENT   Patient Name: [name] MRN: [id number] DOB:1965/11/01, 57 y.o., male Today's Date: 09/13/2022   PCP: Patient is between PCP due to insurance REFERRING PROVIDER: Horton Chin, MDP     PT End of Session - 09/13/22 1110     Visit Number 4    Number of Visits 7   including eval   Date for PT Re-Evaluation 10/19/22   written longer to accomodate schedule fullness   Authorization Type [place] [employer]    Authorization Time Period 08-30-22 - 09-28-22 - 4 visits approved    PT Start Time 1105    PT Stop Time 1147    PT Time Calculation (min) 42 min    Equipment Utilized During Treatment Gait belt    Activity Tolerance Patient tolerated treatment well    Behavior During Therapy WFL for tasks assessed/performed             Past Medical History:  Diagnosis Date   Anxiety    Cataract    removed left eye   Chronic combined systolic and diastolic CHF (congestive heart failure) (HCC)    EF 25-30%   Diastolic dysfunction    Dyslipidemia    Hyperlipidemia    Hypertension    Neuromuscular disorder (HCC)    neuropathy left foot   Nonischemic dilated cardiomyopathy (HCC)    felt secondary to HTN with no ischemia on nuclear stress test 05/2012, EF 25-30%   Seizures (HCC)    last seizure 05-2020- on Keppra   Shortness of breath    Stroke (HCC) 05/2020   Past Surgical History:  Procedure Laterality Date   COLONOSCOPY WITH PROPOFOL N/A 12/09/2020   Procedure: COLONOSCOPY WITH PROPOFOL;  Surgeon: Benancio Deeds, MD;  Location: [facility];  Service: Gastroenterology;  Laterality: N/A;   KNEE ARTHROSCOPY Left 04/13/2016   Procedure: ARTHROSCOPY KNEE WITH DEBRIDEMENT;  Surgeon: Valeria Batman, MD;  Location: [facility];  Service: Orthopedics;  Laterality: Left;   KNEE ARTHROSCOPY WITH MEDIAL MENISECTOMY Right 04/13/2016   Procedure: KNEE ARTHROSCOPY WITH MEDIAL MENISECTOMY;  Surgeon: Valeria Batman, MD;   Location: [facility];  Service: Orthopedics;  Laterality: LEFT not right knee   NASAL FRACTURE SURGERY     SHOULDER SURGERY Left    Patient Active Problem List   Diagnosis Date Noted   Chronic diastolic CHF (congestive heart failure) (HCC) 11/07/2021   Left ventricular hypertrophy 11/07/2021   Osteoarthritis of left glenohumeral joint 05/04/2021   Colon cancer screening    Left hemiplegia (HCC) 07/02/2020   Cognitive deficit due to recent stroke 07/02/2020   Dysphagia, post-stroke    Vascular headache    Acute blood loss anemia    AKI (acute kidney injury) (HCC)    Essential hypertension    Seizures (HCC)    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   ICH (intracerebral hemorrhage) (HCC) 06/09/2020   Sprain of anterior talofibular ligament of left ankle 01/15/2020   Hyperlipidemia, on Lipitor 04/01/2017   Alcohol use disorder, mild, abuse 06/30/2016   Morbid obesity (HCC) 06/30/2016   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   Chronic combined systolic and diastolic CHF (congestive heart failure) (HCC) 11/08/2012   HTN (hypertension) 05/25/2012    ONSET DATE: 10/31/2021   REFERRING DIAG: I61.9 (ICD-10-CM) - Nontraumatic thalamic hemorrhage (HCC) G81.94 (ICD-10-CM) - Left hemiparesis (HCC)   THERAPY DIAG:  Other abnormalities of gait and mobility  Muscle weakness (generalized)  Hemiplegia and hemiparesis following cerebral infarction affecting  left non-dominant side (HCC)  Unsteadiness on feet  Other lack of coordination  Rationale for Evaluation and Treatment Rehabilitation  SUBJECTIVE:                                                                                                                                                                                             SUBJECTIVE STATEMENT:       Pt reports no major changes since treatment session last week and denies recent falls.  He requests BP assessment at onset of session just so he  knows.  Pt accompanied by: self  PERTINENT HISTORY: HTN, Noischemic dilated cardiomyopathy, chronic combined CHF, ETOH, hyperlipidemia, ICH, seizures, CVA 06/09/2020  PAIN:  Are you having pain? Yes: NPRS scale: 6/10 Pain location: left side Pain description: pulsating, needles Aggravating factors: no movement Relieving factors: walking  PRECAUTIONS: Fall  WEIGHT BEARING RESTRICTIONS No  FALLS: Has patient fallen in last 6 months? Yes. Number of falls 1-he was holding a door and LOB, was able to get up by self.  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 Expected reduction in size of hemorrhage compared to 06/19/20. No acute or new findings.  COGNITION: Overall cognitive status: Impaired   SENSATION: Light touch: Impaired  and decreased sensation on L side  COORDINATION: Dysmetric with heel to shin on LLE  EDEMA:  WFL  MUSCLE TONE: LLE spasticity noted during ambulation, mild catch in seated  POSTURE: forward head  LOWER EXTREMITY ROM:     L hemiparetic side = mild decreased hip extension, dorsiflexion, and increased hamstring tightness (varies given tone)  LOWER EXTREMITY MMT:    MMT Right Eval Left Eval  Hip flexion 4+/5 3+/5  Hip extension    Hip abduction 4/5 3+/5  Hip adduction 4+/5 3+/5  Hip internal rotation    Hip external rotation    Knee flexion 5/5 4-/5  Knee extension 5/5 4+/5  Ankle dorsiflexion 5/5 3-/5  Ankle  plantarflexion    Ankle inversion    Ankle eversion    (Blank rows = not tested)  BED MOBILITY:  Sit to supine Complete Independence Supine  to sit Complete Independence Pt reports independence.  TRANSFERS: Assistive device utilized:  Writer tip cane   Sit to stand: SBA Stand to sit: SBA Chair to chair: SBA  GAIT: Gait pattern: decreased hip/knee flexion- Left, decreased ankle dorsiflexion- Left, decreased trunk rotation, wide BOS, abducted- Left, and poor foot clearance- Left Distance walked: various clinic distances Assistive device utilized: small rubber tip cane, none Level of assistance: SBA Comments: Compensates well both with/without AD, decreased LE on L but no major difference noted over short distances in today's session   STAIRS: Level of Assistance: CGA Stair Negotiation Technique: Step to Pattern with Single Rail on Right Number of Stairs: 4  Height of Stairs: 6"  Comments: CGA with min signs of instability, steps up with right and down with L  FUNCTIONAL TESTs:     08/15/22 0001  Standardized Balance Assessment  Standardized Balance Assessment Five Times Sit to Stand;TUG;10 meter walk test  Five times sit to stand comments  11.20 (seconds without UE use (bias towards RLE))  10 Meter Walk 0.69 (m/s with SPC quad tip cane with SBA (0.67 m/s without AD with SBA))    PATIENT SURVEYS:  FOTO not applicable due to chronic nature of CVA  TODAY'S TREATMENT:  -NuStep x8 minutes using BLE only progressing from level 3.0 to 5.0 for neural priming and reciprocal pattern.  STAIRS:  Level of Assistance: SBA and CGA  Stair Negotiation Technique: Step to Pattern Forwards With use of AD: SPC w/ rubber quad tip  with No Rails Single Rail on Right  Number of Stairs: 4x2   Height of Stairs: 6 inch  Comments: Patient has increased fear with stair descent.  He requires CGA and cues for LLE clearance and placement on step.  Right posterior lean noted likely due to  decreased left knee flexion and increased left LE ER.  He tends to lean into railing so educated on holding cane on opposite side without using and use the rail for more stability especially on descent.  He has difficulty turning safely on platform due to placing left foot halfway off edge.  -Advance retreat LLE over 2-4" hurdle for increased flexion pattern > RLE to improve "soft" left knee control -4" step taps 2x12, when tapping RLE cued to maintain and correct knee bend in LLE to prevent hyperextension  PATIENT EDUCATION: Education details:  Continue HEP(s).  Discouraged using swing boxing bag at gym and performing pushups of any form due to shoulder issue and positioning.  Discussed patient's current walking regimen (10 laps of 80 feet each down hallway in residence).  Encouraged using cane properly to limit fall risk especially when fatigued due to patient tending to lift from ground following strenuous activities and ambulation back to mat table. Person educated: Patient Education method: Explanation, demonstration and handout Education comprehension: verbalized understanding and needs further education   HOME EXERCISE PROGRAM:  Access Code: [email or web address] URL: [email or web address] Date: 08/30/2022 Prepared by: Camille Bal  Exercises - Sit to/from Stand in Stride position  - 1 x daily - 5 x weekly - 1 sets - 10 reps - Standing Single Leg Stance with Counter Support  - 1 x daily - 5 x weekly - 1 sets - 3 reps - 10 hold - Heel Raises with Counter Support  - 1 x daily - 4-5 x weekly - 2 sets - 10 reps - Supine Bridge  - 1 x daily - 5 x weekly - 2 sets - 10 reps  Access Code: [email or web address]  URL: [email or web address] Date: 09/07/2022 Prepared by: Maebelle Munroe  Exercises - Seated Knee Flexion with Anchored Resistance  - 1 x daily - 7 x weekly - 2 sets - 10 reps - 2-3 sec hold - Seated Knee Extension with Resistance  - 1 x daily - 7 x weekly - 2 sets - 10 reps  - 3 sec hold - Seated Ankle Dorsiflexion with Resistance  - 1 x daily - 7 x weekly - 2 sets - 10 reps - 3 sec hold  GOALS: Goals reviewed with patient? Yes  SHORT TERM GOALS: Target date: 09/07/2022  Pt will be independent with strength and balance HEP to promote maintenance of gains outside the clinic.  Baseline:  2 HEPs provided (09/13/2022) Goal status: IN PROGRESS   2. Therapist will assess without AD / write LTG as indicated  Baseline: Assessed 8/7 w/ LTG set. Goal status: MET  LONG TERM GOALS: Target date: 10-19-22  Pt to be independent with advance strength and balance HEP. Baseline: To be modified. Goal status: INITIAL  2.  Patient will improve gait speed to 0.8 m/s with LRAD to indicate improvement to the level of community ambulator in order to participate more easily in activities outside of the home.   Baseline: 0.69 m/s with rubber tip quad cane Goal status: INITIAL  3.  Patient will improve ability to ascend/descend 2 x 4 stairs with SBA and single use of rail with step to pattern to improve access to community. Baseline: CGA x 4 stairs Goal status: INITIAL  4.  Pt will ambulate >/= 765 feet on to demonstrate improved functional endurance for home and community participation. Baseline: 665 ft CGA no AD (8/7) Goal status: INITIAL    ASSESSMENT:  CLINICAL IMPRESSION: Ongoing focus on left LE NMR and stair training using SPC with rubber quad tip.  He has tone in left LE that remains helpful in left stance but this in combination with ER of LLE limits his foot placement especially during stair descent.  He would do well with continued practice of this and unlevel surfaces to improve independence with upright mobility.  He may also benefit from closed chain strengthening for left joint approximation.  OBJECTIVE IMPAIRMENTS Abnormal gait, decreased activity tolerance, decreased balance, decreased coordination, decreased endurance, decreased mobility,  difficulty walking, decreased strength, decreased safety awareness, increased edema, impaired sensation, impaired tone, improper body mechanics, and postural dysfunction.   ACTIVITY LIMITATIONS carrying, lifting, stairs, transfers, and locomotion level  PARTICIPATION LIMITATIONS: community activity and occupation  PERSONAL FACTORS Age, Fitness, Past/current experiences, Time since onset of injury/illness/exacerbation, and 1-2 comorbidities: HTN, chronic CHF/cardiomyopathy  are also affecting patient's functional outcome.   REHAB POTENTIAL: Fair See personal factors  CLINICAL DECISION MAKING: Stable/uncomplicated  EVALUATION COMPLEXITY: Low  PLAN: PT FREQUENCY: 1x/week  PT DURATION: 6 weeks  PLANNED INTERVENTIONS: Therapeutic exercises, Therapeutic activity, Neuromuscular re-education, Balance training, Gait training, Patient/Family education, Self Care, Joint mobilization, Stair training, Vestibular training, Orthotic/Fit training, DME instructions, Electrical stimulation, Manual therapy, and Re-evaluation  PLAN FOR NEXT SESSION: Check HEP issued on 09-07-22:  work on stairs - can he do lateral stepping in event rail is on left?, L NMR, lunging style task, squatting style tasks  Sadie Haber, PT, DPT 09/13/2022, 11:47 AM   Check all possible CPT codes: 08657 - PT Re-evaluation, 97110- Therapeutic Exercise, [phone]- Neuro Re-education, [phone] - Gait Training, 97140 - Manual Therapy, 97530 - Therapeutic Activities, and 97535 - Self Care    Check all conditions that  are expected to impact treatment: {Conditions expected to impact treatment:Contractures, spasticity or fracture relevant to requested treatment and Neurological condition and/or seizures   If treatment provided at initial evaluation, no treatment charged due to lack of authorization.

## 2022-09-18 ENCOUNTER — Other Ambulatory Visit (INDEPENDENT_AMBULATORY_CARE_PROVIDER_SITE_OTHER): Payer: Medicaid Other

## 2022-09-18 ENCOUNTER — Ambulatory Visit: Payer: Medicaid Other | Admitting: Orthopedic Surgery

## 2022-09-18 DIAGNOSIS — R29898 Other symptoms and signs involving the musculoskeletal system: Secondary | ICD-10-CM

## 2022-09-18 DIAGNOSIS — M25522 Pain in left elbow: Secondary | ICD-10-CM | POA: Diagnosis not present

## 2022-09-18 NOTE — Progress Notes (Signed)
[name] - 57 y.o. male MRN [id number]  Date of birth: Jun 23, 1965  Office Visit Note: Visit Date: 09/18/2022 PCP: Rema Fendt, NP Referred by: Rema Fendt, NP  Subjective: Chief Complaint  Patient presents with   Left Elbow - Pain   HPI: Malcum Lahue III is a pleasant 57 y.o. male who presents today for ongoing dysfunction of the left upper extremity after recent stroke.  States that approximately 2 months prior, he did have a repeat fall which he thinks may have worsened his left-sided deficits.  He is reporting deficits to both the left upper extremity and left lower extremity.  His left upper extremity elbow remains slightly flexed and he is developing a chronic wrist drop as well.  Pertinent ROS were reviewed with the patient and found to be negative unless otherwise specified above in HPI.   Visit Reason: left elbow  Hand dominance: right Occupation: disabled Diabetic: No Heart/Lung History: none Blood Thinners: none  Prior Testing/EMG: none Injections (Date): none Treatments: none Prior Surgery: none  History of stroke 2 years ago; left side affected Limited usage of left arm Does state his whole arm is numb/tingles Had a fall 2-3 months ago which exacerbated the pain  Assessment & Plan: Visit Diagnoses:  1. Pain in left elbow     Plan: Based on his clinical examination today, there is concern for a more centralized source of nerve pathology.  From a peripheral nerve standpoint, there is a possibility for ongoing radial nerve palsy based on his examination, however given the prior stroke history with known left hemiplegia and more recent worsening symptoms, I would like him to be reevaluated by neurology to rule out CNS or root pathology that could be contributing to his ongoing progressive symptoms.  Given that he has sensory and motor deficits to both the upper and lower extremity, we will place referral back to neurology for repeat evaluation.  From a  peripheral nerve/hand surgery perspective, I will have him fitted today for a wrist brace to help with ongoing wrist drop.  I am happy to see him back in the future should we rule out any central nervous system further workup/treatment for consideration of potential left upper extremity nerve study to look for notable sites of compression.  We did discuss etiology and pathophysiology of neuropraxia within the left upper extremity.  There may be an element of recovery that he can regain with time, however given that his symptoms may be worsening, this is why I would like him to be reevaluated by neurology.  In the future, he would also be a reasonable candidate for discussion regarding tendon transfers in order to help with wrist drop, however if there is an underlying centralized component, this may preclude our ability to utilize other structures within the upper extremity.  Greater than 45 minutes was spent today reviewing previous documentation, history obtaining, and examination and discussion with the patient regarding treatment.   Follow-up: No follow-ups on file.   Meds & Orders: No orders of the defined types were placed in this encounter.   Orders Placed This Encounter  Procedures   XR Elbow Complete Left (3+View)   Ambulatory referral to Neurology     Procedures: No procedures performed      Clinical History: No specialty comments available.  He reports that he has quit smoking. His smoking use included cigarettes. He has been exposed to tobacco smoke. He has never used smokeless tobacco. No results for input(s): "  HGBA1C", "LABURIC" in the last 8760 hours.  Objective:   Vital Signs: There were no vitals taken for this visit.  Physical Exam  Gen: Well-appearing, in no acute distress; non-toxic CV: Regular Rate. Well-perfused. Warm.  Resp: Breathing unlabored on room air; no wheezing. Psych: Fluid speech in conversation; appropriate affect; normal thought process  Ortho  Exam  PHYSICAL EXAM:  General: Patient with notable prior left-sided stroke deficits upper and lower extremity  Range of Motion and Palpation Tests: Left elbow held in slightly flexed position approximately 20 degrees, slightly passively correctable - Difficulty with elbow extension secondary to weakness, able to fire triceps - Elbow flexion is intact  Left wrist with notable wrist drop and associated digital drop as well - Able to perform index extension - Wrist with minimal extension, remains in radial deviated position   Neurologic, Vascular, Motor: Sensation is severely limited to light touch in radial distribution left upper extremity.  Also diminished to light touch in the median/ulnar distributions as well.    Fingers pink and well perfused.  Capillary refill is brisk.      Lab Results  Component Value Date   HGBA1C 5.0 09/14/2021     Imaging: XR Elbow Complete Left (3+View)  Result Date: 09/18/2022 Left elbow AP and lateral views were taken today Elbow held in slightly flexed position, which precludes ability to see on the AP appropriate positioning of the elbow joint.  On lateral view, the radiocapitellar and ulnohumeral joints are well located.  There is notable ossification at the posterior aspect of the olecranon at the triceps insertion.   Past Medical/Family/Surgical/Social History: Medications & Allergies reviewed per EMR, new medications updated. Patient Active Problem List   Diagnosis Date Noted   Chronic diastolic CHF (congestive heart failure) (HCC) 11/07/2021   Left ventricular hypertrophy 11/07/2021   Osteoarthritis of left glenohumeral joint 05/04/2021   Colon cancer screening    Left hemiplegia (HCC) 07/02/2020   Cognitive deficit due to recent stroke 07/02/2020   Dysphagia, post-stroke    Vascular headache    Acute blood loss anemia    AKI (acute kidney injury) (HCC)    Essential hypertension    Seizures (HCC)    Thalamic hemorrhage (HCC)  06/22/2020   ICH (intracerebral hemorrhage) (HCC) 06/09/2020   Sprain of anterior talofibular ligament of left ankle 01/15/2020   Hyperlipidemia, on Lipitor 04/01/2017   Alcohol use disorder, mild, abuse 06/30/2016   Morbid obesity (HCC) 06/30/2016   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   Chronic combined systolic and diastolic CHF (congestive heart failure) (HCC) 11/08/2012   HTN (hypertension) 05/25/2012   Past Medical History:  Diagnosis Date   Anxiety    Cataract    removed left eye   Chronic combined systolic and diastolic CHF (congestive heart failure) (HCC)    EF 25-30%   Diastolic dysfunction    Dyslipidemia    Hyperlipidemia    Hypertension    Neuromuscular disorder (HCC)    neuropathy left foot   Nonischemic dilated cardiomyopathy (HCC)    felt secondary to HTN with no ischemia on nuclear stress test 05/2012, EF 25-30%   Seizures (HCC)    last seizure 05-2020- on Keppra   Shortness of breath    Stroke (HCC) 05/2020   Family History  Problem Relation Age of Onset   Hypertension Mother        Passed away when she is 26 secondary to hip surgery complications   CAD Mother    Heart attack Mother  Hypertension Father    Diabetes Brother    Stroke Brother    Heart attack Maternal Grandmother    Colon cancer Neg Hx    Esophageal cancer Neg Hx    Pancreatic cancer Neg Hx    Stomach cancer Neg Hx    Colon polyps Neg Hx    Past Surgical History:  Procedure Laterality Date   COLONOSCOPY WITH PROPOFOL N/A 12/09/2020   Procedure: COLONOSCOPY WITH PROPOFOL;  Surgeon: Benancio Deeds, MD;  Location: [facility];  Service: Gastroenterology;  Laterality: N/A;   KNEE ARTHROSCOPY Left 04/13/2016   Procedure: ARTHROSCOPY KNEE WITH DEBRIDEMENT;  Surgeon: Valeria Batman, MD;  Location: [facility];  Service: Orthopedics;  Laterality: Left;   KNEE ARTHROSCOPY WITH MEDIAL MENISECTOMY Right 04/13/2016   Procedure: KNEE ARTHROSCOPY WITH MEDIAL  MENISECTOMY;  Surgeon: Valeria Batman, MD;  Location: [facility];  Service: Orthopedics;  Laterality: LEFT not right knee   NASAL FRACTURE SURGERY     SHOULDER SURGERY Left    Social History   Occupational History   Not on file  Tobacco Use   Smoking status: Former    Types: Cigarettes    Passive exposure: Past   Smokeless tobacco: Never  Vaping Use   Vaping status: Never Used  Substance and Sexual Activity   Alcohol use: Not Currently   Drug use: Not Currently    Types: Cocaine, Marijuana, Oxycodone   Sexual activity: Yes    Dak Szumski Trevor Mace, M.D. [facility] 2:50 PM

## 2022-09-19 ENCOUNTER — Ambulatory Visit: Payer: Medicaid Other | Admitting: Occupational Therapy

## 2022-09-19 ENCOUNTER — Encounter: Payer: Self-pay | Admitting: Physical Therapy

## 2022-09-19 ENCOUNTER — Ambulatory Visit: Payer: Medicaid Other | Admitting: Physical Therapy

## 2022-09-19 DIAGNOSIS — R29898 Other symptoms and signs involving the musculoskeletal system: Secondary | ICD-10-CM

## 2022-09-19 DIAGNOSIS — I69354 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-dominant side: Secondary | ICD-10-CM

## 2022-09-19 DIAGNOSIS — M6281 Muscle weakness (generalized): Secondary | ICD-10-CM

## 2022-09-19 DIAGNOSIS — R278 Other lack of coordination: Secondary | ICD-10-CM

## 2022-09-19 DIAGNOSIS — R29818 Other symptoms and signs involving the nervous system: Secondary | ICD-10-CM

## 2022-09-19 DIAGNOSIS — R2689 Other abnormalities of gait and mobility: Secondary | ICD-10-CM | POA: Diagnosis not present

## 2022-09-19 NOTE — Therapy (Signed)
OUTPATIENT PHYSICAL THERAPY NEURO TREATMENT   Patient Name: [name] MRN: [id number] DOB:02/20/1965, 57 y.o., male Today's Date: 09/19/2022   PCP: Patient is between PCP due to insurance REFERRING PROVIDER: Horton Chin, MDP     PT End of Session - 09/19/22 1245     Visit Number 5    Number of Visits 7   including eval   Date for PT Re-Evaluation 10/19/22   written longer to accomodate schedule fullness   Authorization Type [place] Medicaid    Authorization Time Period 08-30-22 - 09-28-22 - 4 visits approved    PT Start Time 1105    PT Stop Time 1146    PT Time Calculation (min) 41 min    Equipment Utilized During Treatment Gait belt    Activity Tolerance Patient tolerated treatment well    Behavior During Therapy WFL for tasks assessed/performed              Past Medical History:  Diagnosis Date   Anxiety    Cataract    removed left eye   Chronic combined systolic and diastolic CHF (congestive heart failure) (HCC)    EF 25-30%   Diastolic dysfunction    Dyslipidemia    Hyperlipidemia    Hypertension    Neuromuscular disorder (HCC)    neuropathy left foot   Nonischemic dilated cardiomyopathy (HCC)    felt secondary to HTN with no ischemia on nuclear stress test 05/2012, EF 25-30%   Seizures (HCC)    last seizure 05-2020- on Keppra   Shortness of breath    Stroke (HCC) 05/2020   Past Surgical History:  Procedure Laterality Date   COLONOSCOPY WITH PROPOFOL N/A 12/09/2020   Procedure: COLONOSCOPY WITH PROPOFOL;  Surgeon: Benancio Deeds, MD;  Location: [facility];  Service: Gastroenterology;  Laterality: N/A;   KNEE ARTHROSCOPY Left 04/13/2016   Procedure: ARTHROSCOPY KNEE WITH DEBRIDEMENT;  Surgeon: Valeria Batman, MD;  Location: [facility];  Service: Orthopedics;  Laterality: Left;   KNEE ARTHROSCOPY WITH MEDIAL MENISECTOMY Right 04/13/2016   Procedure: KNEE ARTHROSCOPY WITH MEDIAL MENISECTOMY;  Surgeon: Valeria Batman, MD;   Location: [facility];  Service: Orthopedics;  Laterality: LEFT not right knee   NASAL FRACTURE SURGERY     SHOULDER SURGERY Left    Patient Active Problem List   Diagnosis Date Noted   Chronic diastolic CHF (congestive heart failure) (HCC) 11/07/2021   Left ventricular hypertrophy 11/07/2021   Osteoarthritis of left glenohumeral joint 05/04/2021   Colon cancer screening    Left hemiplegia (HCC) 07/02/2020   Cognitive deficit due to recent stroke 07/02/2020   Dysphagia, post-stroke    Vascular headache    Acute blood loss anemia    AKI (acute kidney injury) (HCC)    Essential hypertension    Seizures (HCC)    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   ICH (intracerebral hemorrhage) (HCC) 06/09/2020   Sprain of anterior talofibular ligament of left ankle 01/15/2020   Hyperlipidemia, on Lipitor 04/01/2017   Alcohol use disorder, mild, abuse 06/30/2016   Morbid obesity (HCC) 06/30/2016   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   Chronic combined systolic and diastolic CHF (congestive heart failure) (HCC) 11/08/2012   HTN (hypertension) 05/25/2012    ONSET DATE: 10/31/2021   REFERRING DIAG: I61.9 (ICD-10-CM) - Nontraumatic thalamic hemorrhage (HCC) G81.94 (ICD-10-CM) - Left hemiparesis (HCC)   THERAPY DIAG:  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-dominant side (HCC)  Muscle weakness (generalized)  Other  abnormalities of gait and mobility  Rationale for Evaluation and Treatment Rehabilitation  SUBJECTIVE:                                                                                                                                                                                             SUBJECTIVE STATEMENT:       Pt reports he saw orthopedic doctor yesterday - gave him a splint for his Lt wrist but also referred him back to neurologist.  Pt reports his left knee hyperextends after he has been walking a lot - says  it didn't used to do it as much as it is now  Pt accompanied by: self  PERTINENT HISTORY: HTN, Noischemic dilated cardiomyopathy, chronic combined CHF, ETOH, hyperlipidemia, ICH, seizures, CVA 06/09/2020  PAIN:  Are you having pain? Yes: NPRS scale: 5-6/10 Pain location: left side -upper body is worse than lower body Pain description: pulsating, needles Aggravating factors: no movement Relieving factors: walking  PRECAUTIONS: Fall  WEIGHT BEARING RESTRICTIONS No  FALLS: Has patient fallen in last 6 months? Yes. Number of falls 1-he was holding a door and LOB, was able to get up by self.  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 Expected reduction in size of hemorrhage compared to 06/19/20. No acute or new findings.  COGNITION: Overall cognitive status: Impaired   SENSATION: Light touch: Impaired  and decreased sensation on L side  COORDINATION: Dysmetric with heel to shin on LLE  EDEMA:  WFL  MUSCLE TONE: LLE spasticity noted during ambulation, mild catch in seated  POSTURE: forward head  LOWER EXTREMITY ROM:     L hemiparetic side = mild decreased hip extension, dorsiflexion, and increased hamstring tightness (varies given tone)  LOWER EXTREMITY MMT:    MMT Right Eval Left Eval  Hip flexion 4+/5 3+/5  Hip extension    Hip abduction 4/5 3+/5  Hip adduction 4+/5 3+/5  Hip internal rotation    Hip external rotation    Knee flexion  5/5 4-/5  Knee extension 5/5 4+/5  Ankle dorsiflexion 5/5 3-/5  Ankle plantarflexion    Ankle inversion  Ankle eversion    (Blank rows = not tested)  BED MOBILITY:  Sit to supine Complete Independence Supine to sit Complete Independence Pt reports independence.  TRANSFERS: Assistive device utilized:  Writer tip cane   Sit to stand: SBA Stand to sit: SBA Chair to chair: SBA  GAIT: Gait pattern: decreased hip/knee flexion- Left, decreased ankle dorsiflexion- Left, decreased trunk rotation, wide BOS, abducted- Left, and poor foot clearance- Left Distance walked: various clinic distances Assistive device utilized: small rubber tip cane, none Level of assistance: SBA Comments: Compensates well both with/without AD, decreased LE on L but no major difference noted over short distances in today's session   STAIRS: Level of Assistance: CGA Stair Negotiation Technique: Step to Pattern with Single Rail on Right Number of Stairs: 4  Height of Stairs: 6"  Comments: CGA with min signs of instability, steps up with right and down with L  FUNCTIONAL TESTs:     08/15/22 0001  Standardized Balance Assessment  Standardized Balance Assessment Five Times Sit to Stand;TUG;10 meter walk test  Five times sit to stand comments  11.20 (seconds without UE use (bias towards RLE))  10 Meter Walk 0.69 (m/s with SPC quad tip cane with SBA (0.67 m/s without AD with SBA))    PATIENT SURVEYS:  FOTO not applicable due to chronic nature of CVA  TODAY'S TREATMENT:   LLE hamstring stretch - runner's stretch in standing - 20 sec hold x 1 rep  LLE step ups 10 reps onto 6" step with use of Rt handrail  LLE isometric strengthening and SLS activity - pt tapped 2nd step with his RLE with CGA and with use of handrail prn; mod assist from PT to prevent Lt knee hyperextension  Lt knee extension control exercise to control hyperextension in all phases of gait cycle - red theraband used - closed  chain knee extension with feet in mid stance, Rt foot forward, Rt foot backward  - 15 reps knee flexion/extension in all 3 positions  Seated Lt knee flexion with red theraband for resistance - 10 reps with 3 sec hold  Sit to stand from mat table - Rt foot on 4" step, Lt foot on floor 10 reps with RUE support prn with CGA  Attempted trial of toe off AFO but pt stated he would not be able to get his Lt foot in the shoe with the brace - states he is going to talk to Dr. Carlis Abbott at his next scheduled appt about getting a new AFO  SciFit - level 2.5 x 13" with Ue's and LE's - (no charge as unsupervised)  PATIENT EDUCATION: Education details:  Continue HEP; discussed need for AFO for LLE to control Lt knee hyperextension Person educated: Patient Education method: Explanation, demonstration and handout Education comprehension: verbalized understanding and needs further education   HOME EXERCISE PROGRAM:  Access Code: [email or web address] URL: [email or web address] Date: 08/30/2022 Prepared by: Camille Bal  Exercises - Sit to/from Stand in Stride position  - 1 x daily - 5 x weekly - 1 sets - 10 reps - Standing Single Leg Stance with Counter Support  - 1 x daily - 5 x weekly - 1 sets - 3 reps - 10 hold - Heel Raises with Counter Support  - 1 x daily - 4-5 x weekly - 2 sets - 10 reps - Supine Bridge  - 1 x daily - 5 x weekly - 2 sets - 10 reps  Access Code: [email or web address] URL: [email or web address] Date: 09/07/2022 Prepared by: Bonita Quin  Nollie Terlizzi  Exercises - Seated Knee Flexion with Anchored Resistance  - 1 x daily - 7 x weekly - 2 sets - 10 reps - 2-3 sec hold - Seated Knee Extension with Resistance  - 1 x daily - 7 x weekly - 2 sets - 10 reps - 3 sec hold - Seated Ankle Dorsiflexion with Resistance  - 1 x daily - 7 x weekly - 2 sets - 10 reps - 3 sec hold  GOALS: Goals reviewed with patient? Yes  SHORT TERM GOALS: Target date: 09/07/2022  Pt will be independent with  strength and balance HEP to promote maintenance of gains outside the clinic.  Baseline:  2 HEPs provided (09/13/2022) Goal status: IN PROGRESS   2. Therapist will assess without AD / write LTG as indicated  Baseline: Assessed 8/7 w/ LTG set. Goal status: MET  LONG TERM GOALS: Target date: 10-19-22  Pt to be independent with advance strength and balance HEP. Baseline: To be modified. Goal status: INITIAL  2.  Patient will improve gait speed to 0.8 m/s with LRAD to indicate improvement to the level of community ambulator in order to participate more easily in activities outside of the home.   Baseline: 0.69 m/s with rubber tip quad cane Goal status: INITIAL  3.  Patient will improve ability to ascend/descend 2 x 4 stairs with SBA and single use of rail with step to pattern to improve access to community. Baseline: CGA x 4 stairs Goal status: INITIAL  4.  Pt will ambulate >/= 765 feet on to demonstrate improved functional endurance for home and community participation. Baseline: 665 ft CGA no AD (8/7) Goal status: INITIAL    ASSESSMENT:  CLINICAL IMPRESSION: Pt has met STG #1 as initial HEP has been issued.   STG #2 was assessed with LTG written for 6" walk test.  Pt has Lt knee hyperextension which increases with fatigue.  Today's PT session focused on LLE strengthening and knee extension control exercises to control hyperextension in stance.  Pt had 1 occurrence of Lt foot/toes catching floor during gait, but pt able to independently recover LOB.  Pt needed mod assist to prevent Lt knee from hyperextending in Lt SLS.  Pt would benefit from new AFO to decrease Lt knee hyperextension in stance and to increase Lt foot clearance in swing to increase safety with gait.  Pt would benefit from 2 additional PT visits to address LLE strengthening and to finalize HEP.   Cont with POC.     OBJECTIVE IMPAIRMENTS Abnormal gait, decreased activity tolerance, decreased balance, decreased  coordination, decreased endurance, decreased mobility, difficulty walking, decreased strength, decreased safety awareness, increased edema, impaired sensation, impaired tone, improper body mechanics, and postural dysfunction.   ACTIVITY LIMITATIONS carrying, lifting, stairs, transfers, and locomotion level  PARTICIPATION LIMITATIONS: community activity and occupation  PERSONAL FACTORS Age, Fitness, Past/current experiences, Time since onset of injury/illness/exacerbation, and 1-2 comorbidities: HTN, chronic CHF/cardiomyopathy  are also affecting patient's functional outcome.   REHAB POTENTIAL: Fair See personal factors  CLINICAL DECISION MAKING: Stable/uncomplicated  EVALUATION COMPLEXITY: Low  PLAN: PT FREQUENCY: 1x/week  PT DURATION: 6 weeks  PLANNED INTERVENTIONS: Therapeutic exercises, Therapeutic activity, Neuromuscular re-education, Balance training, Gait training, Patient/Family education, Self Care, Joint mobilization, Stair training, Vestibular training, Orthotic/Fit training, DME instructions, Electrical stimulation, Manual therapy, and Re-evaluation  PLAN FOR NEXT SESSION:   2 additional visits requested from Medicaid; begin planning for D/C; pt to ask Dr. Carlis Abbott about getting new AFO - may be able  to get it with Medicare, which he states he gets in Nov.   Teya Otterson, Donavan Burnet, PT 09/19/2022, 12:49 PM   Check all possible CPT codes: 75643 - PT Re-evaluation, 97110- Therapeutic Exercise, [phone]- Neuro Re-education, [phone] - Gait Training, [phone] - Manual Therapy, [phone] - Therapeutic Activities, and [phone] - Self Care    Check all conditions that are expected to impact treatment: {Conditions expected to impact treatment:Contractures, spasticity or fracture relevant to requested treatment and Neurological condition and/or seizures   If treatment provided at initial evaluation, no treatment charged due to lack of authorization.

## 2022-09-19 NOTE — Therapy (Unsigned)
OUTPATIENT OCCUPATIONAL THERAPY NEURO TREATMENT  Patient Name: [name] MRN: [id number] DOB:October 23, 1965, 57 y.o., male Today's Date: 09/19/2022  PCP: Rema Fendt, NP REFERRING PROVIDER: Horton Chin, MD    OT End of Session - 09/19/22 1226     Visit Number 2    Number of Visits 8    Date for OT Re-Evaluation 11/10/22    Authorization Type [place] Medicaid - requires auth    OT Start Time 1230    OT Stop Time 1315    OT Time Calculation (min) 45 min    Activity Tolerance Patient tolerated treatment well    Behavior During Therapy WFL for tasks assessed/performed              Past Medical History:  Diagnosis Date   Anxiety    Cataract    removed left eye   Chronic combined systolic and diastolic CHF (congestive heart failure) (HCC)    EF 25-30%   Diastolic dysfunction    Dyslipidemia    Hyperlipidemia    Hypertension    Neuromuscular disorder (HCC)    neuropathy left foot   Nonischemic dilated cardiomyopathy (HCC)    felt secondary to HTN with no ischemia on nuclear stress test 05/2012, EF 25-30%   Seizures (HCC)    last seizure 05-2020- on Keppra   Shortness of breath    Stroke (HCC) 05/2020   Past Surgical History:  Procedure Laterality Date   COLONOSCOPY WITH PROPOFOL N/A 12/09/2020   Procedure: COLONOSCOPY WITH PROPOFOL;  Surgeon: Benancio Deeds, MD;  Location: [facility];  Service: Gastroenterology;  Laterality: N/A;   KNEE ARTHROSCOPY Left 04/13/2016   Procedure: ARTHROSCOPY KNEE WITH DEBRIDEMENT;  Surgeon: Valeria Batman, MD;  Location: [facility];  Service: Orthopedics;  Laterality: Left;   KNEE ARTHROSCOPY WITH MEDIAL MENISECTOMY Right 04/13/2016   Procedure: KNEE ARTHROSCOPY WITH MEDIAL MENISECTOMY;  Surgeon: Valeria Batman, MD;  Location:  SUR[facility]ERY CENTER;  Service: Orthopedics;  Laterality: LEFT not right knee   NASAL FRACTURE SURGERY     SHOULDER SURGERY Left    Patient Active Problem List   Diagnosis  Date Noted   Chronic diastolic CHF (congestive heart failure) (HCC) 11/07/2021   Left ventricular hypertrophy 11/07/2021   Osteoarthritis of left glenohumeral joint 05/04/2021   Colon cancer screening    Left hemiplegia (HCC) 07/02/2020   Cognitive deficit due to recent stroke 07/02/2020   Dysphagia, post-stroke    Vascular headache    Acute blood loss anemia    AKI (acute kidney injury) (HCC)    Essential hypertension    Seizures (HCC)    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   ICH (intracerebral hemorrhage) (HCC) 06/09/2020   Sprain of anterior talofibular ligament of left ankle 01/15/2020   Hyperlipidemia, on Lipitor 04/01/2017   Alcohol use disorder, mild, abuse 06/30/2016   Morbid obesity (HCC) 06/30/2016   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   Chronic combined systolic and diastolic CHF (congestive heart failure) (HCC) 11/08/2012   HTN (hypertension) 05/25/2012    ONSET DATE: 06/08/20   REFERRING DIAG: G81.94 (ICD-10-CM) - Left hemiparesis  THERAPY DIAG:  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-dominant side (HCC)  Muscle weakness (generalized)  Other lack of coordination  Other symptoms and signs involving the musculoskeletal system  Other symptoms and signs involving the nervous system  Rationale for Evaluation and Treatment Rehabilitation  SUBJECTIVE:   SUBJECTIVE STATEMENT: Pt reports he spoke with CJ about getting a (Zynex?)  device for his LUE paresthesias and pain.  Pt saw doctor yesterday and has a wrist cock up splint for wrsit drop with referral to see neurology.     Pt accompanied by: self  PERTINENT HISTORY: Update 10/31/2021 JM: Patient returns for follow-up after prior visit 7 months ago regarding history of ICH, seizures and headaches.  He is unaccompanied.  Reports residual left sided weakness and nerve pain. Was previously just in foot, but now going up his whole leg to his hip.  At prior visit  with PMR, recommended trialing different medications but he wishes to try to avoid medications if able.  He questions restarting therapies.  Continues to ambulate with a cane, denies any recent falls.  Does mention left side of face and tongue feels swollen over the past 2 weeks, denies any change in diet or medications.  Denies any difficulty breathing or swallowing. Compliant on Keppra, no seizure activity. Has not had any recent headaches, currently on topiramate 50 mg twice daily. On atorvastatin, denies side effects.  Blood pressure well controlled, today 112/65.  Is being seen by cardiology next week for further blood pressure management.  No further concerns at this time.  PRECAUTIONS: Fall  WEIGHT BEARING RESTRICTIONS: No  PAIN:  Are you having pain? Yes: NPRS scale: 5/10 Pain location: L side of body Pain description: tingling and numbness (daily though) Aggravating factors: cold Relieving factors: Gabapentin 3x/day , Voltaren (just got some over the counter)  FALLS: Has patient fallen in last 6 months? Yes. Number of falls 1 - 2-3 months ago - fall bumping L side  LIVING ENVIRONMENT: [facility] with: [facility] Lives in: one level dorm Stairs: No Has following equipment at home: Counselling psychologist, Hemi walker, Wheelchair (manual), and Shower chair (no back)  PLOF: Requires assistive device for independence and Vocation/Vocational requirements: pt is on disability  PATIENT GOALS: improve use of LUE and overall independence with completion of functional activities.  OBJECTIVE:   MRI results:  1. Mild-moderate rotator cuff tendinosis without tear. 2. Severe intra-articular biceps tendinosis with mild tenosynovitis. 3. 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  HAND DOMINANCE: Right  ADLs: Eating: needs assist to cut food Grooming: mod I  UB Dressing: mod I, difficulty w/ buttons LB Dressing: assist to tie shoes Toileting: mod I  Bathing:  mod I w/ difficulty getting under Rt arm Tub Shower transfers: mod I  Equipment: Shower seat with back  IADLs: Shopping: mod independent Light housekeeping: mod I  Meal Prep: uses air fryer, [place] [employer], microwave Community mobility: drives I'ly Medication management: mod I  Handwriting:  denies change  MOBILITY STATUS: Independent; uses quad cane; increased time   ACTIVITY TOLERANCE: Activity tolerance: good  FUNCTIONAL OUTCOME MEASURES: Quick Dash: 63.6 % disability with use of LUE   UPPER EXTREMITY ROM     AROM Right eval Left eval  Shoulder flexion WNL 120* with ataxia  Shoulder abduction WNL 90* with ataxia reports limiting ROM in this direction as his shoulder pops out of socket  Elbow flexion WNL 132*  Elbow extension WNL -36*  Wrist flexion WNL WFL  Wrist extension WNL N/A past neutral  Wrist pronation WNL WFL  Wrist supination WNL 60* with time  (Blank rows = not tested)  L Digit flex and ext: [facility]; Digit opposition: BFL, very ataxic  UPPER EXTREMITY MMT:     MMT Right eval Left eval  Shoulder flexion WNL WFL within available ROM  Shoulder abduction WNL "  Elbow flexion WNL WNL  Elbow extension WNL WNL  (Blank rows = not tested)  HAND FUNCTION: Grip strength: Right: 110.6 lbs; Left: 28.4 lbs  COORDINATION: Box and Blocks:  Right 47blocks, Left 8blocks  SENSATION: Light touch: abscent Proprioception: Impaired   EDEMA: none reported or observed  MUSCLE TONE: LUE: Mild and Hypertonic  COGNITION: Overall cognitive status: Within functional limits for tasks assessed  VISION: Subjective report: No change Baseline vision: Wears glasses for reading only Visual history: corrective eye surgery  VISION ASSESSMENT: Not tested - denies change   OBSERVATIONS: Pt holds LUE in guarded positioning to L side of body. No volitional use with completion of functional activities.    TODAY'S TREATMENT:                                                                           Reprinted and reviewed multiple exercises/activities  Shoulder Shrugs Dowel exercises with cane Wrist stretch (over ball)  Get out of splint    PATIENT EDUCATION: Education details: OT POC Person educated: Patient Education method: Explanation Education comprehension: verbalized understanding and needs further education  HOME EXERCISE PROGRAM: Access Code: [email or web address] URL: [email or web address] Date: 09/19/2022 Prepared by: Amada Kingfisher  Exercises - Standing Shoulder Shrugs  - 2 x daily - 10 reps  GOALS:  SHORT TERM GOALS: Target date: 10/11/2022    Patient will demonstrate LUE ROM HEP with 25% verbal cues or less for proper execution. Baseline: Does not have handouts Goal status: INITIAL  2.  Pt will demonstrate ability to pick up 9 hole peg and place into board in under 50 seconds Baseline: lacks coordination due to ataxia and limited opposition Goal status: INITIAL  3.  Pt to verbalize understanding of safety considerations d/t sensory loss LUE Baseline: Not yet initiated Goal status: INITIAL   LONG TERM GOALS: Target date:  11/10/2022  1. Patient will demonstrate updated LUE HEP with 25% verbal cues or less for proper execution.  Baseline: Not yet initiated Goal status: INITIAL  2.  Patient will demonstrate at least 16%  improvement with quick Dash score (reporting 47.6% disability or less) indicating improved functional use of affected extremity.  Baseline: 63.6 % disability with use of LUE Goal status: INITIAL  3.  Will improve Box and Blocks by at least 4 blocks.  Baseline: 8 blocks Goal status: INITIAL   4.  Pt will perform home management and stovetop based meal prep with good safety awareness and use of modifications/DME as needed.   Baseline: uses appliances Goal status: INITIAL   ASSESSMENT:  CLINICAL IMPRESSION: Patient is a 57 y.o. male who was seen today for occupational therapy evaluation for L  hemiparesis following CVA. Hx includes cataract extraction (OU), anxiety, CHF, HTN, neuropathy of L foot, and seizures. Patient currently presents below baseline level of functioning demonstrating functional deficits and impairments as noted below. Pt would benefit from skilled OT services in the outpatient setting to work on impairments as noted above to help pt return to help improve independence and safety with ADLs and IADLs as pt will be moving into a place on his own.   PERFORMANCE DEFICITS: in functional skills including ADLs, IADLs, coordination, sensation, ROM, strength, pain, Fine motor control, Gross  motor control, mobility, balance, and UE functional use.  IMPAIRMENTS: are limiting patient from ADLs, IADLs, and leisure.   CO-MORBIDITIES; may have co-morbidities  that affects occupational performance. Patient will benefit from skilled OT to address above impairments and improve overall function.  REHAB POTENTIAL: Fair secondary to insurance visit limits and limited caregiver assistance/home environment     PLAN:  OT FREQUENCY: 1x/week   OT DURATION: 7 weeks   PLANNED INTERVENTIONS: self care/ADL training, therapeutic exercise, therapeutic activity, neuromuscular re-education, manual therapy, functional mobility training, electrical stimulation, patient/family education, DME and/or AE instructions, and Re-evaluation  RECOMMENDED OTHER SERVICES: n/a  CONSULTED AND AGREED WITH PLAN OF CARE: Patient  PLAN FOR NEXT SESSION:  Review HEP to put in binder (reprint) Stroke Support group Coordination modifications   Victorino Sparrow, OT 09/19/2022, 6:38 PM

## 2022-09-26 ENCOUNTER — Ambulatory Visit: Payer: Medicaid Other | Admitting: Physical Therapy

## 2022-09-28 ENCOUNTER — Encounter: Payer: Self-pay | Admitting: Urology

## 2022-10-01 ENCOUNTER — Ambulatory Visit
Admission: RE | Admit: 2022-10-01 | Discharge: 2022-10-01 | Disposition: A | Discharge status: Home / Self Care | Payer: Medicaid Other | Source: Ambulatory Visit | Attending: Urology | Admitting: Urology

## 2022-10-01 DIAGNOSIS — R972 Elevated prostate specific antigen [PSA]: Secondary | ICD-10-CM

## 2022-10-01 MED ORDER — GADOPICLENOL 0.5 MMOL/ML IV SOLN
8.0000 mL | Freq: Once | INTRAVENOUS | Status: AC | PRN
  Administered 2022-10-01: 8 mL via INTRAVENOUS

## 2022-10-03 ENCOUNTER — Encounter: Payer: Self-pay | Admitting: Physical Therapy

## 2022-10-03 ENCOUNTER — Ambulatory Visit: Payer: Medicaid Other | Attending: Physical Medicine and Rehabilitation | Admitting: Physical Therapy

## 2022-10-03 ENCOUNTER — Telehealth: Payer: Self-pay | Admitting: Neurology

## 2022-10-03 ENCOUNTER — Ambulatory Visit: Payer: Medicaid Other | Admitting: Occupational Therapy

## 2022-10-03 VITALS — BP 114/73 | HR 79

## 2022-10-03 DIAGNOSIS — R29898 Other symptoms and signs involving the musculoskeletal system: Secondary | ICD-10-CM | POA: Insufficient documentation

## 2022-10-03 DIAGNOSIS — M6281 Muscle weakness (generalized): Secondary | ICD-10-CM | POA: Diagnosis present

## 2022-10-03 DIAGNOSIS — R278 Other lack of coordination: Secondary | ICD-10-CM | POA: Insufficient documentation

## 2022-10-03 DIAGNOSIS — R208 Other disturbances of skin sensation: Secondary | ICD-10-CM | POA: Insufficient documentation

## 2022-10-03 DIAGNOSIS — I69154 Hemiplegia and hemiparesis following nontraumatic intracerebral hemorrhage affecting left non-dominant side: Secondary | ICD-10-CM | POA: Insufficient documentation

## 2022-10-03 DIAGNOSIS — G8929 Other chronic pain: Secondary | ICD-10-CM | POA: Insufficient documentation

## 2022-10-03 DIAGNOSIS — R2681 Unsteadiness on feet: Secondary | ICD-10-CM | POA: Insufficient documentation

## 2022-10-03 DIAGNOSIS — I69354 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-dominant side: Secondary | ICD-10-CM | POA: Insufficient documentation

## 2022-10-03 DIAGNOSIS — R2689 Other abnormalities of gait and mobility: Secondary | ICD-10-CM | POA: Insufficient documentation

## 2022-10-03 DIAGNOSIS — M25512 Pain in left shoulder: Secondary | ICD-10-CM | POA: Insufficient documentation

## 2022-10-03 NOTE — Telephone Encounter (Signed)
error 

## 2022-10-03 NOTE — Therapy (Signed)
OUTPATIENT PHYSICAL THERAPY NEURO TREATMENT / MEDICAID VISIT REQUEST   Patient Name: [name] MRN: [id number] DOB:[birth year], 57 y.o., male Today's Date: 10/03/2022   PCP: Patient is between PCP due to insurance REFERRING PROVIDER: Horton Chin, MDP     PT End of Session - 10/03/22 1116     Visit Number 6    Number of Visits 7    Date for PT Re-Evaluation 10/19/22    Authorization Type [place] Medicaid    Authorization Time Period --    PT Start Time 1113    PT Stop Time 1146    PT Time Calculation (min) 33 min    Equipment Utilized During Treatment Gait belt    Activity Tolerance Patient tolerated treatment well    Behavior During Therapy WFL for tasks assessed/performed              Past Medical History:  Diagnosis Date   Anxiety    Cataract    removed left eye   Chronic combined systolic and diastolic CHF (congestive heart failure) (HCC)    EF 25-30%   Diastolic dysfunction    Dyslipidemia    Hyperlipidemia    Hypertension    Neuromuscular disorder (HCC)    neuropathy left foot   Nonischemic dilated cardiomyopathy (HCC)    felt secondary to HTN with no ischemia on nuclear stress test 05/2012, EF 25-30%   Seizures (HCC)    last seizure 05-2020- on Keppra   Shortness of breath    Stroke (HCC) 05/2020   Past Surgical History:  Procedure Laterality Date   COLONOSCOPY WITH PROPOFOL N/A 12/09/2020   Procedure: COLONOSCOPY WITH PROPOFOL;  Surgeon: Benancio Deeds, MD;  Location: [facility];  Service: Gastroenterology;  Laterality: N/A;   KNEE ARTHROSCOPY Left 04/13/2016   Procedure: ARTHROSCOPY KNEE WITH DEBRIDEMENT;  Surgeon: Valeria Batman, MD;  Location: [facility];  Service: Orthopedics;  Laterality: Left;   KNEE ARTHROSCOPY WITH MEDIAL MENISECTOMY Right 04/13/2016   Procedure: KNEE ARTHROSCOPY WITH MEDIAL MENISECTOMY;  Surgeon: Valeria Batman, MD;  Location: [facility];  Service: Orthopedics;  Laterality:  LEFT not right knee   NASAL FRACTURE SURGERY     SHOULDER SURGERY Left    Patient Active Problem List   Diagnosis Date Noted   Chronic diastolic CHF (congestive heart failure) (HCC) 11/07/2021   Left ventricular hypertrophy 11/07/2021   Osteoarthritis of left glenohumeral joint 05/04/2021   Colon cancer screening    Left hemiplegia (HCC) 07/02/2020   Cognitive deficit due to recent stroke 07/02/2020   Dysphagia, post-stroke    Vascular headache    Acute blood loss anemia    AKI (acute kidney injury) (HCC)    Essential hypertension    Seizures (HCC)    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   ICH (intracerebral hemorrhage) (HCC) 06/09/2020   Sprain of anterior talofibular ligament of left ankle 01/15/2020   Hyperlipidemia, on Lipitor 04/01/2017   Alcohol use disorder, mild, abuse 06/30/2016   Morbid obesity (HCC) 06/30/2016   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   Chronic combined systolic and diastolic CHF (congestive heart failure) (HCC) 11/08/2012   HTN (hypertension) 05/25/2012    ONSET DATE: 10/31/2021   REFERRING DIAG: I61.9 (ICD-10-CM) - Nontraumatic thalamic hemorrhage (HCC) G81.94 (ICD-10-CM) - Left hemiparesis (HCC)   THERAPY DIAG:  Muscle weakness (generalized)  Other abnormalities of gait and mobility  Rationale for Evaluation and Treatment Rehabilitation  SUBJECTIVE:  SUBJECTIVE STATEMENT:  Pt reports being back in the gym and doing well. Denies falls/near falls. Ready for PT D/C in one visit.   Pt accompanied by: self  PERTINENT HISTORY: HTN, Noischemic dilated cardiomyopathy, chronic combined CHF, ETOH, hyperlipidemia, ICH, seizures, CVA 06/09/2020  PAIN:  Are you having pain? Yes: NPRS scale: unrated/10 Pain location: left side -upper body is worse than  lower body Pain description: pulsating, needles Aggravating factors: no movement Relieving factors: walking  PRECAUTIONS: Fall  WEIGHT BEARING RESTRICTIONS No  FALLS: Has patient fallen in last 6 months? Yes. Number of falls 1-he was holding a door and LOB, was able to get up by self.  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 Expected reduction in size of hemorrhage compared to 06/19/20. No acute or new findings.  COGNITION: Overall cognitive status: Impaired   SENSATION: Light touch: Impaired  and decreased sensation on L side  COORDINATION: Dysmetric with heel to shin on LLE  EDEMA:  WFL  MUSCLE TONE: LLE spasticity noted during ambulation, mild catch in seated  POSTURE: forward head  LOWER EXTREMITY ROM:     L hemiparetic side = mild decreased hip extension, dorsiflexion, and increased hamstring tightness (varies given tone)  LOWER EXTREMITY MMT:    MMT Right Eval Left Eval  Hip flexion 4+/5 3+/5  Hip extension    Hip abduction 4/5 3+/5  Hip adduction 4+/5 3+/5  Hip internal rotation    Hip external rotation    Knee flexion 5/5 4-/5  Knee extension 5/5 4+/5  Ankle dorsiflexion 5/5 3-/5  Ankle plantarflexion    Ankle inversion    Ankle eversion    (Blank rows = not tested)  BED MOBILITY:  Sit to supine Complete Independence Supine to sit Complete Independence Pt reports independence.  TRANSFERS: Assistive device utilized:   Writer tip cane   Sit to stand: SBA Stand to sit: SBA Chair to chair: SBA  GAIT: Gait pattern: decreased hip/knee flexion- Left, decreased ankle dorsiflexion- Left, decreased trunk rotation, wide BOS, abducted- Left, and poor foot clearance- Left Distance walked: various clinic distances Assistive device utilized: small rubber tip cane, none Level of assistance: SBA Comments: Compensates well both with/without AD, decreased LE on L but no major difference noted over short distances in today's session   STAIRS: Level of Assistance: CGA Stair Negotiation Technique: Step to Pattern with Single Rail on Right Number of Stairs: 4  Height of Stairs: 6"  Comments: CGA with min signs of instability, steps up with right and down with L  FUNCTIONAL TESTs:     08/15/22 0001  Standardized Balance Assessment  Standardized Balance Assessment Five Times Sit to Stand;TUG;10 meter walk test  Five times sit to stand comments  11.20 (seconds without UE use (bias towards RLE))  10 Meter Walk 0.69 (m/s with SPC quad tip cane with SBA (0.67 m/s without AD with SBA))    PATIENT SURVEYS:  FOTO not applicable due to chronic nature of CVA  TODAY'S TREATMENT:  Vitals:   10/03/22 1123  BP: 114/73  Pulse: 79   TherAct (Assessed Goals)  [facility] PT Assessment - 10/03/22 0001       6 minute walk test results    Aerobic Endurance Distance Walked 846   feet with rubber quad tip cane (SBA)     Standardized Balance Assessment   Standardized Balance Assessment 10 meter walk test    10 Meter Walk 0.85   m/s with rubber base quad cane (modI)           STAIRS:  Level of Assistance: SBA  Stair Negotiation Technique: Step to Pattern with Single Rail on Right  Number of Stairs: 2 x 4   Height of Stairs: 6"  Comments: Able to lead step down with L and R LE; educated on when weaker to stepdown first with LLE    Weight on Scale per patient request (step on with SBA): 195.8 lbs  PATIENT  EDUCATION: Education details:  Continue HEP; discussed need for AFO for LLE to control Lt knee hyperextension Person educated: Patient Education method: Explanation, demonstration and handout Education comprehension: verbalized understanding and needs further education   HOME EXERCISE PROGRAM:  Access Code: [email or web address] URL: [email or web address] Date: 08/30/2022 Prepared by: Camille Bal  Exercises - Sit to/from Stand in Stride position  - 1 x daily - 5 x weekly - 1 sets - 10 reps - Standing Single Leg Stance with Counter Support  - 1 x daily - 5 x weekly - 1 sets - 3 reps - 10 hold - Heel Raises with Counter Support  - 1 x daily - 4-5 x weekly - 2 sets - 10 reps - Supine Bridge  - 1 x daily - 5 x weekly - 2 sets - 10 reps  Access Code: [email or web address] URL: [email or web address] Date: 09/07/2022 Prepared by: Maebelle Munroe  Exercises - Seated Knee Flexion with Anchored Resistance  - 1 x daily - 7 x weekly - 2 sets - 10 reps - 2-3 sec hold - Seated Knee Extension with Resistance  - 1 x daily - 7 x weekly - 2 sets - 10 reps - 3 sec hold - Seated Ankle Dorsiflexion with Resistance  - 1 x daily - 7 x weekly - 2 sets - 10 reps - 3 sec hold  GOALS: Goals reviewed with patient? Yes  SHORT TERM GOALS: Target date: 09/07/2022  Pt will be independent with strength and balance HEP to promote maintenance of gains outside the clinic.  Baseline:  2 HEPs provided (09/13/2022) Goal status: IN PROGRESS   2. Therapist will assess without AD / write LTG as indicated  Baseline: Assessed 8/7 w/ LTG set. Goal status: MET  LONG TERM GOALS: Target date: 10-19-22  Pt to be independent with advance strength and balance HEP. Baseline: Reports exercises at home are going well thus far Goal status: IN PROGRESS  2.  Patient will improve gait speed to 0.8 m/s with LRAD to indicate improvement to the level of community ambulator in order to participate more easily in  activities outside of the home.   Baseline: 0.69 m/s with rubber tip quad cane; 0.85 m/s with rubber tip quad cane Goal status: MET  3.  Patient will improve ability to ascend/descend 2 x 4 stairs with SBA and single use of rail with step to pattern to improve access to community. Baseline: CGA x 4 stairs; SBA 2 x 4 steps Goal status: MET  4.  Pt will ambulate >/= 765 feet on  to demonstrate improved functional endurance for home and community participation. Baseline: 665 ft CGA no AD (8/7); improved to 846 ft SBA with SPC rubber quad tip cane (SBA) - plan to assess without AD next session  Goal status: IN PROGRESS  ASSESSMENT:  CLINICAL IMPRESSION: Skilled physical therapy session emphasized goal check; patient progressing excellently towards goals demonstrating improved safety with stair navigation, improved cardiorespiratory endurance with , and improved gait speed. Recommend 1 additional PT visit to finalize HEP and progress towards D/C. Cont with POC.     OBJECTIVE IMPAIRMENTS Abnormal gait, decreased activity tolerance, decreased balance, decreased coordination, decreased endurance, decreased mobility, difficulty walking, decreased strength, decreased safety awareness, increased edema, impaired sensation, impaired tone, improper body mechanics, and postural dysfunction.   ACTIVITY LIMITATIONS carrying, lifting, stairs, transfers, and locomotion level  PARTICIPATION LIMITATIONS: community activity and occupation  PERSONAL FACTORS Age, Fitness, Past/current experiences, Time since onset of injury/illness/exacerbation, and 1-2 comorbidities: HTN, chronic CHF/cardiomyopathy  are also affecting patient's functional outcome.   REHAB POTENTIAL: Fair See personal factors  CLINICAL DECISION MAKING: Stable/uncomplicated  EVALUATION COMPLEXITY: Low  PLAN: PT FREQUENCY: 1x/week  PT DURATION: 6 weeks  PLANNED INTERVENTIONS: Therapeutic exercises, Therapeutic activity,  Neuromuscular re-education, Balance training, Gait training, Patient/Family education, Self Care, Joint mobilization, Stair training, Vestibular training, Orthotic/Fit training, DME instructions, Electrical stimulation, Manual therapy, and Re-evaluation  PLAN FOR NEXT SESSION:   Plan for PT D/C - assess without AD  Carmelia Bake, PT, DPT 10/03/2022, 12:07 PM   Check all possible CPT codes: 40981 - PT Re-evaluation, 97110- Therapeutic Exercise, [phone]- Neuro Re-education, [phone] - Gait Training, [phone] - Manual Therapy, [phone] - Therapeutic Activities, and [phone] - Self Care    Check all conditions that are expected to impact treatment: {Conditions expected to impact treatment:Contractures, spasticity or fracture relevant to requested treatment and Neurological condition and/or seizures   If treatment provided at initial evaluation, no treatment charged due to lack of authorization.

## 2022-10-10 ENCOUNTER — Ambulatory Visit: Payer: Medicaid Other | Admitting: Occupational Therapy

## 2022-10-10 ENCOUNTER — Encounter: Payer: Medicaid Other | Admitting: Physical Medicine and Rehabilitation

## 2022-10-10 DIAGNOSIS — R278 Other lack of coordination: Secondary | ICD-10-CM

## 2022-10-10 DIAGNOSIS — G8929 Other chronic pain: Secondary | ICD-10-CM

## 2022-10-10 DIAGNOSIS — R29898 Other symptoms and signs involving the musculoskeletal system: Secondary | ICD-10-CM

## 2022-10-10 DIAGNOSIS — I69354 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-dominant side: Secondary | ICD-10-CM

## 2022-10-10 DIAGNOSIS — M6281 Muscle weakness (generalized): Secondary | ICD-10-CM | POA: Diagnosis not present

## 2022-10-10 NOTE — Therapy (Unsigned)
OUTPATIENT OCCUPATIONAL THERAPY NEURO TREATMENT  Patient Name: [name] MRN: [id number] DOB:November 20, 1965, 57 y.o., male Today's Date: 10/10/2022  PCP: Rema Fendt, NP REFERRING PROVIDER: Horton Chin, MD    OT End of Session - 10/10/22 1746     Visit Number 3    Number of Visits 8    Date for OT Re-Evaluation 11/10/22    Authorization Type [place] [employer] - requires auth    Authorization Time Period through 11/17/2022    Authorization - Number of Visits 9    OT Start Time 1147    OT Stop Time 1229    OT Time Calculation (min) 42 min    Activity Tolerance Patient tolerated treatment well    Behavior During Therapy WFL for tasks assessed/performed               Past Medical History:  Diagnosis Date   Anxiety    Cataract    removed left eye   Chronic combined systolic and diastolic CHF (congestive heart failure) (HCC)    EF 25-30%   Diastolic dysfunction    Dyslipidemia    Hyperlipidemia    Hypertension    Neuromuscular disorder (HCC)    neuropathy left foot   Nonischemic dilated cardiomyopathy (HCC)    felt secondary to HTN with no ischemia on nuclear stress test 05/2012, EF 25-30%   Seizures (HCC)    last seizure 05-2020- on Keppra   Shortness of breath    Stroke (HCC) 05/2020   Past Surgical History:  Procedure Laterality Date   COLONOSCOPY WITH PROPOFOL N/A 12/09/2020   Procedure: COLONOSCOPY WITH PROPOFOL;  Surgeon: Benancio Deeds, MD;  Location: [facility];  Service: Gastroenterology;  Laterality: N/A;   KNEE ARTHROSCOPY Left 04/13/2016   Procedure: ARTHROSCOPY KNEE WITH DEBRIDEMENT;  Surgeon: Valeria Batman, MD;  Location: [facility];  Service: Orthopedics;  Laterality: Left;   KNEE ARTHROSCOPY WITH MEDIAL MENISECTOMY Right 04/13/2016   Procedure: KNEE ARTHROSCOPY WITH MEDIAL MENISECTOMY;  Surgeon: Valeria Batman, MD;  Location: [facility];  Service: Orthopedics;  Laterality: LEFT not right knee   NASAL  FRACTURE SURGERY     SHOULDER SURGERY Left    Patient Active Problem List   Diagnosis Date Noted   Chronic diastolic CHF (congestive heart failure) (HCC) 11/07/2021   Left ventricular hypertrophy 11/07/2021   Osteoarthritis of left glenohumeral joint 05/04/2021   Colon cancer screening    Left hemiplegia (HCC) 07/02/2020   Cognitive deficit due to recent stroke 07/02/2020   Dysphagia, post-stroke    Vascular headache    Acute blood loss anemia    AKI (acute kidney injury) (HCC)    Essential hypertension    Seizures (HCC)    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   ICH (intracerebral hemorrhage) (HCC) 06/09/2020   Sprain of anterior talofibular ligament of left ankle 01/15/2020   Hyperlipidemia, on Lipitor 04/01/2017   Alcohol use disorder, mild, abuse 06/30/2016   Morbid obesity (HCC) 06/30/2016   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   Chronic combined systolic and diastolic CHF (congestive heart failure) (HCC) 11/08/2012   HTN (hypertension) 05/25/2012    ONSET DATE: 06/08/20   REFERRING DIAG: G81.94 (ICD-10-CM) - Left hemiparesis  THERAPY DIAG:  Muscle weakness (generalized)  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-dominant side (HCC)  Other lack of coordination  Other symptoms and signs involving the musculoskeletal system  Chronic left shoulder pain  Rationale for Evaluation and Treatment Rehabilitation  SUBJECTIVE:  SUBJECTIVE STATEMENT: Pt reports he spoke with CJ about getting a (Zynex?) device for his LUE paresthesias and pain.  Pt saw doctor yesterday and has a wrist cock up splint for 'wrist drop' with referral to see neurology.  Based on his clinical examination today, there is concern for a more centralized source of nerve pathology.  From a peripheral nerve standpoint, there is a possibility for ongoing radial nerve palsy based on his examination, however given the prior stroke history with known left  hemiplegia and more recent worsening symptoms, I would like him to be reevaluated by neurology to rule out CNS or root pathology that could be contributing to his ongoing progressive symptoms.   Given that he has sensory and motor deficits to both the upper and lower extremity, we will place referral back to neurology for repeat evaluation.  From a peripheral nerve/hand surgery perspective, I will have him fitted today for a wrist brace to help with ongoing wrist drop.  I am happy to see him back in the future should we rule out any central nervous system further workup/treatment for consideration of potential left upper extremity nerve study to look for notable sites of compression.   We did discuss etiology and pathophysiology of neuropraxia within the left upper extremity.  There may be an element of recovery that he can regain with time, however given that his symptoms may be worsening, this is why I would like him to be reevaluated by neurology.   In the future, he would also be a reasonable candidate for discussion regarding tendon transfers in order to help with wrist drop, however if there is an underlying centralized component, this may preclude our ability to utilize other structures within the upper extremity.  He did not report getting an injection also but MR review reveals Ortho appt re: shoulder arthritis: He certainly has a very difficult situation regarding his residual neurologic deficits and underlying shoulder arthritis. I suspect that his arthritis has been exacerbated by his previous fall as well as the dyskinesis related to his CVA with spasticity. We have discussed at length the long-term prognosis and treatment options. I recommended an intra-articular injection for symptomatic relief. He is in agreement and this is performed today using standard technique.   We have discussed the importance of activity modifications on a long-term basis. Given the fact that he has a significant  left hemiparesis I am hopeful that the injection will provide long-term benefit. All questions are encouraged and answered and at this time by mutual agreement we will plan to see him back on an as-needed basis.   Pt accompanied by: self  PERTINENT HISTORY: Update 10/31/2021 JM: Patient returns for follow-up after prior visit 7 months ago regarding history of ICH, seizures and headaches.  He is unaccompanied.  Reports residual left sided weakness and nerve pain. Was previously just in foot, but now going up his whole leg to his hip.  At prior visit with PMR, recommended trialing different medications but he wishes to try to avoid medications if able.  He questions restarting therapies.  Continues to ambulate with a cane, denies any recent falls.  Does mention left side of face and tongue feels swollen over the past 2 weeks, denies any change in diet or medications.  Denies any difficulty breathing or swallowing. Compliant on Keppra, no seizure activity. Has not had any recent headaches, currently on topiramate 50 mg twice daily. On atorvastatin, denies side effects.  Blood pressure well controlled, today 112/65.  Is being seen by cardiology next week for further blood pressure management.  No further concerns at this time.  PRECAUTIONS: Fall  WEIGHT BEARING RESTRICTIONS: No  PAIN:  Are you having pain? Yes: NPRS scale: 5/10 Pain location: L side of body Pain description: Not particularly pain but tingling and numbness (daily though) Aggravating factors: cold Relieving factors: Gabapentin 3x/day , Voltaren (just got some over the counter)  FALLS: Has patient fallen in last 6 months? Yes. Number of falls 1 - 2-3 months ago - fall bumping L side  LIVING ENVIRONMENT: [facility]: [facility] Lives in: one level dorm Stairs: No Has following equipment at home: Counselling psychologist, Hemi walker, Wheelchair (manual), and Shower chair (no back)  PLOF: Requires assistive device for  independence and Vocation/Vocational requirements: pt is on disability  PATIENT GOALS: improve use of LUE and overall independence with completion of functional activities.  OBJECTIVE:   MRI results:  1. Mild-moderate rotator cuff tendinosis without tear. 2. Severe intra-articular biceps tendinosis with mild tenosynovitis. 3. 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  HAND DOMINANCE: Right  ADLs: Eating: needs assist to cut food Grooming: mod I  UB Dressing: mod I, difficulty w/ buttons LB Dressing: assist to tie shoes Toileting: mod I  Bathing: mod I w/ difficulty getting under Rt arm Tub Shower transfers: mod I  Equipment: Shower seat with back  IADLs: Shopping: mod independent Light housekeeping: mod I  Meal Prep: uses air fryer, [place] [employer], microwave Community mobility: drives I'ly Medication management: mod I  Handwriting:  denies change  MOBILITY STATUS: Independent; uses quad cane; increased time   ACTIVITY TOLERANCE: Activity tolerance: good  FUNCTIONAL OUTCOME MEASURES: Quick Dash: 63.6 % disability with use of LUE   UPPER EXTREMITY ROM     AROM Right eval Left eval  Shoulder flexion WNL 120* with ataxia  Shoulder abduction WNL 90* with ataxia reports limiting ROM in this direction as his shoulder pops out of socket  Elbow flexion WNL 132*  Elbow extension WNL -36*  Wrist flexion WNL WFL  Wrist extension WNL N/A past neutral  Wrist pronation WNL WFL  Wrist supination WNL 60* with time  (Blank rows = not tested)  L Digit flex and ext: [facility]; Digit opposition: BFL, very ataxic  UPPER EXTREMITY MMT:     MMT Right eval Left eval  Shoulder flexion WNL WFL within available ROM  Shoulder abduction WNL "  Elbow flexion WNL WNL  Elbow extension WNL WNL  (Blank rows = not tested)  HAND FUNCTION: Grip strength: Right: 110.6 lbs; Left: 28.4 lbs  COORDINATION: Box and Blocks:  Right 47blocks, Left  8blocks  SENSATION: Light touch: abscent Proprioception: Impaired   EDEMA: none reported or observed  MUSCLE TONE: LUE: Mild and Hypertonic  COGNITION: Overall cognitive status: Within functional limits for tasks assessed  VISION: Subjective report: No change Baseline vision: Wears glasses for reading only Visual history: corrective eye surgery  VISION ASSESSMENT: Not tested - denies change   OBSERVATIONS: Pt holds LUE in guarded positioning to L side of body. No volitional use with completion of functional activities.    TODAY'S TREATMENT:  OT educated patient on use of NMES unit as noted in patient instructions. Therapist educated patient on application for ***L wrist and digit extension. Patient able to teach back information provided and tolerated supervised application to promote improved AROM and pain management. Therapist assisting patient with marking of electrode placement on arm to help with carryover outside of therapy.  Modalities Skin Integrity Assessment:  NMES utilized: No redness, irritation or skin integrity concerns were noted before, during and/or after use. Patient was informed of risks and benefits to treatment today. Skin integrity prior to treatment: Intact. Skin integrity after treatment: Intact.  HEP reprint  PATIENT EDUCATION: Education details: OT POC Person educated: Patient Education method: Explanation Education comprehension: verbalized understanding and needs further education  HOME EXERCISE PROGRAM: 09/20/26 Started HEP with Shoulder Shrugs Access Code: [email or web address]   GOALS:  SHORT TERM GOALS: Target date: 10/11/2022    Patient will demonstrate LUE ROM HEP with 25% verbal cues or less for proper execution. Baseline: Does not have handouts Goal status: IN PROGRESS  2.  Pt will demonstrate ability to pick up 9 hole peg and place into board in under 50 seconds Baseline:  lacks coordination due to ataxia and limited opposition Goal status: IN PROGRESS  3.  Pt to verbalize understanding of safety considerations d/t sensory loss LUE Baseline: Not yet initiated Goal status: INITIAL   LONG TERM GOALS: Target date:  11/10/2022  1. Patient will demonstrate updated LUE HEP with 25% verbal cues or less for proper execution.  Baseline: Not yet initiated Goal status: IN Progress  2.  Patient will demonstrate at least 16%  improvement with quick Dash score (reporting 47.6% disability or less) indicating improved functional use of affected extremity.  Baseline: 63.6 % disability with use of LUE Goal status: INITIAL  3.  Will improve Box and Blocks by at least 4 blocks.  Baseline: 8 blocks Goal status: IN Progress  4.  Pt will perform home management and stovetop based meal prep with good safety awareness and use of modifications/DME as needed.   Baseline: uses appliances Goal status: INITIAL   ASSESSMENT:  CLINICAL IMPRESSION: Patient is a 57 y.o. male who was seen today for occupational therapy treatment for L hemiparesis following CVA. Pt received recent injection in L shoulder for pain management. Patient currently presents below baseline level of function in L hand/arm and will benefit from skilled OT services in the outpatient setting to work on HEP ideas as needed to help pt return to help improve independence and safety with ADLs and IADLs as pt will be moving into a place on his own.   PERFORMANCE DEFICITS: in functional skills including ADLs, IADLs, coordination, sensation, ROM, strength, pain, Fine motor control, Gross motor control, mobility, balance, and UE functional use.  IMPAIRMENTS: are limiting patient from ADLs, IADLs, and leisure.   CO-MORBIDITIES; may have co-morbidities  that affects occupational performance. Patient will benefit from skilled OT to address above impairments and improve overall function.  REHAB POTENTIAL: Fair  secondary to insurance visit limits and limited caregiver assistance/home environment  PLAN:  OT FREQUENCY: 1x/week   OT DURATION: 7 weeks   PLANNED INTERVENTIONS: self care/ADL training, therapeutic exercise, therapeutic activity, neuromuscular re-education, manual therapy, functional mobility training, electrical stimulation, patient/family education, DME and/or AE instructions, and Re-evaluation  RECOMMENDED OTHER SERVICES: n/a  CONSULTED AND AGREED WITH PLAN OF CARE: Patient  PLAN FOR NEXT SESSION:  Review, progress and update HEP to put in binder (reprint) Info re: Stroke Support group Coordination  activity modifications   Delana Meyer, OT 10/10/2022, 5:48 PM

## 2022-10-17 ENCOUNTER — Encounter: Payer: Self-pay | Admitting: Physical Therapy

## 2022-10-17 ENCOUNTER — Ambulatory Visit: Payer: Medicaid Other | Admitting: Occupational Therapy

## 2022-10-17 ENCOUNTER — Ambulatory Visit: Payer: Medicaid Other | Admitting: Physical Therapy

## 2022-10-17 VITALS — BP 118/68 | HR 73

## 2022-10-17 DIAGNOSIS — G8929 Other chronic pain: Secondary | ICD-10-CM

## 2022-10-17 DIAGNOSIS — R29898 Other symptoms and signs involving the musculoskeletal system: Secondary | ICD-10-CM

## 2022-10-17 DIAGNOSIS — M6281 Muscle weakness (generalized): Secondary | ICD-10-CM

## 2022-10-17 DIAGNOSIS — I69154 Hemiplegia and hemiparesis following nontraumatic intracerebral hemorrhage affecting left non-dominant side: Secondary | ICD-10-CM

## 2022-10-17 DIAGNOSIS — R208 Other disturbances of skin sensation: Secondary | ICD-10-CM

## 2022-10-17 DIAGNOSIS — R2681 Unsteadiness on feet: Secondary | ICD-10-CM

## 2022-10-17 DIAGNOSIS — R2689 Other abnormalities of gait and mobility: Secondary | ICD-10-CM

## 2022-10-17 DIAGNOSIS — I69354 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-dominant side: Secondary | ICD-10-CM

## 2022-10-17 DIAGNOSIS — R278 Other lack of coordination: Secondary | ICD-10-CM

## 2022-10-17 NOTE — Therapy (Unsigned)
OUTPATIENT OCCUPATIONAL THERAPY NEURO TREATMENT  Patient Name: [name] MRN: [id number] DOB:08-Aug-1965, 57 y.o., male Today's Date: 10/17/2022  PCP: Rema Fendt, NP REFERRING PROVIDER: Horton Chin, MD    OT End of Session - 10/17/22 1155     Visit Number 4    Number of Visits 8    Date for OT Re-Evaluation 11/10/22    Authorization Type [employer] - requires auth    Authorization Time Period through 11/17/2022    Authorization - Number of Visits 9    OT Start Time 1153    OT Stop Time 1231    OT Time Calculation (min) 38 min    Activity Tolerance Patient tolerated treatment well    Behavior During Therapy WFL for tasks assessed/performed               Past Medical History:  Diagnosis Date   Anxiety    Cataract    removed left eye   Chronic combined systolic and diastolic CHF (congestive heart failure) (HCC)    EF 25-30%   Diastolic dysfunction    Dyslipidemia    Hyperlipidemia    Hypertension    Neuromuscular disorder (HCC)    neuropathy left foot   Nonischemic dilated cardiomyopathy (HCC)    felt secondary to HTN with no ischemia on nuclear stress test 05/2012, EF 25-30%   Seizures (HCC)    last seizure 05-2020- on Keppra   Shortness of breath    Stroke (HCC) 05/2020   Past Surgical History:  Procedure Laterality Date   COLONOSCOPY WITH PROPOFOL N/A 12/09/2020   Procedure: COLONOSCOPY WITH PROPOFOL;  Surgeon: Benancio Deeds, MD;  Location: [facility];  Service: Gastroenterology;  Laterality: N/A;   KNEE ARTHROSCOPY Left 04/13/2016   Procedure: ARTHROSCOPY KNEE WITH DEBRIDEMENT;  Surgeon: Valeria Batman, MD;  Location: [facility];  Service: Orthopedics;  Laterality: Left;   KNEE ARTHROSCOPY WITH MEDIAL MENISECTOMY Right 04/13/2016   Procedure: KNEE ARTHROSCOPY WITH MEDIAL MENISECTOMY;  Surgeon: Valeria Batman, MD;  Location: [facility];  Service: Orthopedics;  Laterality: LEFT not right knee   NASAL  FRACTURE SURGERY     SHOULDER SURGERY Left    Patient Active Problem List   Diagnosis Date Noted   Chronic diastolic CHF (congestive heart failure) (HCC) 11/07/2021   Left ventricular hypertrophy 11/07/2021   Osteoarthritis of left glenohumeral joint 05/04/2021   Colon cancer screening    Left hemiplegia (HCC) 07/02/2020   Cognitive deficit due to recent stroke 07/02/2020   Dysphagia, post-stroke    Vascular headache    Acute blood loss anemia    AKI (acute kidney injury) (HCC)    Essential hypertension    Seizures (HCC)    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   ICH (intracerebral hemorrhage) (HCC) 06/09/2020   Sprain of anterior talofibular ligament of left ankle 01/15/2020   Hyperlipidemia, on Lipitor 04/01/2017   Alcohol use disorder, mild, abuse 06/30/2016   Morbid obesity (HCC) 06/30/2016   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   Chronic combined systolic and diastolic CHF (congestive heart failure) (HCC) 11/08/2012   HTN (hypertension) 05/25/2012    ONSET DATE: 06/08/20   REFERRING DIAG: G81.94 (ICD-10-CM) - Left hemiparesis  THERAPY DIAG:  Muscle weakness (generalized)  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-dominant side (HCC)  Other lack of coordination  Other symptoms and signs involving the musculoskeletal system  Chronic left shoulder pain  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 He has been sleeping in his brace and feels his wrist "flops" less.   Pt saw doctor yesterday and has a wrist cock up splint for 'wrist drop' with referral to see neurology.  Based on his clinical examination today, there is concern for a more centralized source of nerve  pathology.  From a peripheral nerve standpoint, there is a possibility for ongoing radial nerve palsy based on his examination, however given the prior stroke history with known left hemiplegia and more recent worsening symptoms, I would like him to be reevaluated by neurology to rule out CNS or root pathology that could be contributing to his ongoing progressive symptoms.   Given that he has sensory and motor deficits to both the upper and lower extremity, we will place referral back to neurology for repeat evaluation.  From a peripheral nerve/hand surgery perspective, I will have him fitted today for a wrist brace to help with ongoing wrist drop.  I am happy to see him back in the future should we rule out any central nervous system further workup/treatment for consideration of potential left upper extremity nerve study to look for notable sites of compression.   We did discuss etiology and pathophysiology of neuropraxia within the left upper extremity.  There may be an element of recovery that he can regain with time, however given that his symptoms may be worsening, this is why I would like him to be reevaluated by neurology.   In the future, he would also be a reasonable candidate for discussion regarding tendon transfers in order to help with wrist drop, however if there is an underlying centralized component, this may preclude our ability to utilize other structures within the upper extremity.  He did not report getting an injection also but MR review reveals Ortho appt re: shoulder arthritis: He certainly has a very difficult situation regarding his residual neurologic deficits and underlying shoulder arthritis. I suspect that his arthritis has been exacerbated by his previous fall as well as the dyskinesis related to his CVA with spasticity. We have discussed at length the long-term prognosis and treatment options. I recommended an intra-articular injection for symptomatic relief. He is in  agreement and this is performed today using standard technique.   We have discussed the importance of activity modifications on a long-term basis. Given the fact that he has a significant left hemiparesis I am hopeful that the injection will provide long-term benefit. All questions are encouraged and answered and at this time by mutual agreement we will plan to see him back on an as-needed basis.   Pt accompanied by: self  PERTINENT HISTORY: Update 10/31/2021 JM: Patient returns for follow-up after prior visit 7 months ago regarding history of ICH, seizures and headaches.  He is unaccompanied.  Reports residual left sided weakness and nerve pain. Was previously just in foot, but now going up his whole leg to his hip.  At prior visit with PMR, recommended trialing different medications but he wishes to try to avoid medications if able.  He questions restarting therapies.  Continues to ambulate with a cane, denies any recent falls.  Does mention left side of face and tongue feels swollen over the past 2 weeks, denies any change in diet or medications.  Denies any difficulty breathing or swallowing. Compliant on Keppra,  no seizure activity. Has not had any recent headaches, currently on topiramate 50 mg twice daily. On atorvastatin, denies side effects.  Blood pressure well controlled, today 112/65.  Is being seen by cardiology next week for further blood pressure management.  No further concerns at this time.  PRECAUTIONS: Fall  WEIGHT BEARING RESTRICTIONS: No  PAIN:  Are you having pain? Yes: NPRS scale: 5/10 Pain location: L side of body/knee Pain description: Not particularly pain but tingling and numbness (daily though) Aggravating factors: cold Relieving factors: Gabapentin 3x/day , Voltaren (just got some over the counter)  FALLS: Has patient fallen in last 6 months? Yes. Number of falls 1 - 2-3 months ago - fall bumping L side  LIVING ENVIRONMENT: [facility] with: [facility] Lives in:  one level dorm Stairs: No Has following equipment at home: Counselling psychologist, Hemi walker, Wheelchair (manual), and Shower chair (no back)  PLOF: Requires assistive device for independence and Vocation/Vocational requirements: pt is on disability  PATIENT GOALS: improve use of LUE and overall independence with completion of functional activities.  OBJECTIVE:   MRI results:  1. Mild-moderate rotator cuff tendinosis without tear. 2. Severe intra-articular biceps tendinosis with mild tenosynovitis. 3. 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  HAND DOMINANCE: Right  ADLs: Eating: needs assist to cut food Grooming: mod I  UB Dressing: mod I, difficulty w/ buttons LB Dressing: assist to tie shoes Toileting: mod I  Bathing: mod I w/ difficulty getting under Rt arm Tub Shower transfers: mod I  Equipment: Shower seat with back  IADLs: Shopping: mod independent Light housekeeping: mod I  Meal Prep: uses air fryer, [place] [employer], microwave Community mobility: drives I'[place] Medication management: mod I  Handwriting:  denies change  MOBILITY STATUS: Independent; uses quad cane; increased time   ACTIVITY TOLERANCE: Activity tolerance: good  FUNCTIONAL OUTCOME MEASURES: Quick Dash: 63.6 % disability with use of LUE   UPPER EXTREMITY ROM     AROM Right eval Left eval  Shoulder flexion WNL 120* with ataxia  Shoulder abduction WNL 90* with ataxia reports limiting ROM in this direction as his shoulder pops out of socket  Elbow flexion WNL 132*  Elbow extension WNL -36*  Wrist flexion WNL WFL  Wrist extension WNL N/A past neutral  Wrist pronation WNL WFL  Wrist supination WNL 60* with time  (Blank rows = not tested)  L Digit flex and ext: [facility]; Digit opposition: BFL, very ataxic  UPPER EXTREMITY MMT:     MMT Right eval Left eval  Shoulder flexion WNL WFL within available ROM  Shoulder abduction WNL "  Elbow flexion WNL WNL  Elbow extension WNL  WNL  (Blank rows = not tested)  HAND FUNCTION: Grip strength: Right: 110.6 lbs; Left: 28.4 lbs  COORDINATION: Box and Blocks:  Right 47blocks, Left 8blocks  SENSATION: Light touch: abscent Proprioception: Impaired   EDEMA: none reported or observed  MUSCLE TONE: LUE: Mild and Hypertonic  COGNITION: Overall cognitive status: Within functional limits for tasks assessed  VISION: Subjective report: No change Baseline vision: Wears glasses for reading only Visual history: corrective eye surgery  VISION ASSESSMENT: Not tested - denies change   OBSERVATIONS: Pt holds LUE in guarded positioning to L side of body. No volitional use with completion of functional activities.    TODAY'S TREATMENT:  OT re-educated pt on LUE coordination HOME EXERCISE PROGRAM as noted in patient instructions including pick up and placement of items, shuffling and turning cards, item stacking and unstacking, rolling a piece of tissue paper into a ball, rolling a pen in between fingers and thumb, picking up and storing items in hand, Patient able to return demonstration of all exercises. Handout provided. Limited return demonstration based on extent of limitations.   PATIENT EDUCATION: Education details: HEP review Person educated: Patient Education method: Explanation, Demonstration, and Handouts Education comprehension: verbalized understanding and needs further education  HOME EXERCISE PROGRAM: 09/20/26 Started HEP with Shoulder Shrugs Access Code: [email or web address]  GOALS:  SHORT TERM GOALS: Target date: 10/11/2022    Patient will demonstrate LUE ROM HEP with 25% verbal cues or less for proper execution. Baseline: Does not have handouts Goal status: IN PROGRESS  2.  Pt will demonstrate ability to pick up 9 hole peg and place into board in under 50 seconds Baseline: lacks coordination due to ataxia and limited opposition Goal  status: IN PROGRESS  3.  Pt to verbalize understanding of safety considerations d/t sensory loss LUE Baseline: Not yet initiated Goal status: INITIAL   LONG TERM GOALS: Target date:  11/10/2022  1. Patient will demonstrate updated LUE HEP with 25% verbal cues or less for proper execution.  Baseline: Not yet initiated Goal status: IN Progress  2.  Patient will demonstrate at least 16%  improvement with quick Dash score (reporting 47.6% disability or less) indicating improved functional use of affected extremity.  Baseline: 63.6 % disability with use of LUE Goal status: INITIAL  3.  Will improve Box and Blocks by at least 4 blocks.  Baseline: 8 blocks Goal status: IN Progress  4.  Pt will perform home management and stovetop based meal prep with good safety awareness and use of modifications/DME as needed.   Baseline: uses appliances Goal status: INITIAL   ASSESSMENT:  CLINICAL IMPRESSION: Patient demonstrates need for increased time with completion of coordination exercises as well as cues to actively monitor positioning of L wrist to avoid constant wrist drop.   PERFORMANCE DEFICITS: in functional skills including ADLs, IADLs, coordination, sensation, ROM, strength, pain, Fine motor control, Gross motor control, mobility, balance, and UE functional use.  IMPAIRMENTS: are limiting patient from ADLs, IADLs, and leisure.   CO-MORBIDITIES; may have co-morbidities  that affects occupational performance. Patient will benefit from skilled OT to address above impairments and improve overall function.  REHAB POTENTIAL: Fair secondary to insurance visit limits and limited caregiver assistance/home environment  PLAN:  OT FREQUENCY: 1x/week   OT DURATION: 7 weeks   PLANNED INTERVENTIONS: self care/ADL training, therapeutic exercise, therapeutic activity, neuromuscular re-education, manual therapy, functional mobility training, electrical stimulation, patient/family education, DME  and/or AE instructions, and Re-evaluation  RECOMMENDED OTHER SERVICES: n/a  CONSULTED AND AGREED WITH PLAN OF CARE: Patient  PLAN FOR NEXT SESSION:  Review putty, progress and update HEP to put in binder  Info re: Stroke Support group Coordination activity modifications   Delana Meyer, OT 10/17/2022, 11:56 AM

## 2022-10-17 NOTE — Therapy (Signed)
OUTPATIENT PHYSICAL THERAPY NEURO TREATMENT / DISCHARGE   Patient Name: [name] MRN: [id number] DOB:18-Oct-1965, 57 y.o., male Today's Date: 10/17/2022   PCP: Patient is between PCP due to insurance REFERRING PROVIDER: Horton Chin, MDP   PHYSICAL THERAPY DISCHARGE SUMMARY  Visits from Start of Care: 7  Current functional level related to goals / functional outcomes: See below   Remaining deficits: New acute R knee pain (patient to follow up with ortho), requires use of SPC with quad tip   Education / Equipment: Use of SPC with quad tip, follow up with ortho   Patient agrees to discharge. Patient goals were partially met. Patient is being discharged due to being pleased with the current functional level.     PT End of Session - 10/17/22 1235     Visit Number 7    Number of Visits 7    Date for PT Re-Evaluation 10/19/22    Authorization Type [place] Medicaid    PT Start Time 1235    PT Stop Time 1252    PT Time Calculation (min) 17 min    Equipment Utilized During Treatment Gait belt    Activity Tolerance Patient tolerated treatment well    Behavior During Therapy WFL for tasks assessed/performed              Past Medical History:  Diagnosis Date   Anxiety    Cataract    removed left eye   Chronic combined systolic and diastolic CHF (congestive heart failure) (HCC)    EF 25-30%   Diastolic dysfunction    Dyslipidemia    Hyperlipidemia    Hypertension    Neuromuscular disorder (HCC)    neuropathy left foot   Nonischemic dilated cardiomyopathy (HCC)    felt secondary to HTN with no ischemia on nuclear stress test 05/2012, EF 25-30%   Seizures (HCC)    last seizure 05-2020- on Keppra   Shortness of breath    Stroke (HCC) 05/2020   Past Surgical History:  Procedure Laterality Date   COLONOSCOPY WITH PROPOFOL N/A 12/09/2020   Procedure: COLONOSCOPY WITH PROPOFOL;  Surgeon: Benancio Deeds, MD;  Location: [facility];  Service:  Gastroenterology;  Laterality: N/A;   KNEE ARTHROSCOPY Left 04/13/2016   Procedure: ARTHROSCOPY KNEE WITH DEBRIDEMENT;  Surgeon: Valeria Batman, MD;  Location: [facility];  Service: Orthopedics;  Laterality: Left;   KNEE ARTHROSCOPY WITH MEDIAL MENISECTOMY Right 04/13/2016   Procedure: KNEE ARTHROSCOPY WITH MEDIAL MENISECTOMY;  Surgeon: Valeria Batman, MD;  Location: [facility];  Service: Orthopedics;  Laterality: LEFT not right knee   NASAL FRACTURE SURGERY     SHOULDER SURGERY Left    Patient Active Problem List   Diagnosis Date Noted   Chronic diastolic CHF (congestive heart failure) (HCC) 11/07/2021   Left ventricular hypertrophy 11/07/2021   Osteoarthritis of left glenohumeral joint 05/04/2021   Colon cancer screening    Left hemiplegia (HCC) 07/02/2020   Cognitive deficit due to recent stroke 07/02/2020   Dysphagia, post-stroke    Vascular headache    Acute blood loss anemia    AKI (acute kidney injury) (HCC)    Essential hypertension    Seizures (HCC)    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   ICH (intracerebral hemorrhage) (HCC) 06/09/2020   Sprain of anterior talofibular ligament of left ankle 01/15/2020   Hyperlipidemia, on Lipitor 04/01/2017   Alcohol use disorder, mild, abuse 06/30/2016   Morbid obesity (HCC) 06/30/2016   Nonischemic dilated cardiomyopathy (  HCC) 11/08/2012   Chronic combined systolic and diastolic CHF (congestive heart failure) (HCC) 11/08/2012   HTN (hypertension) 05/25/2012    ONSET DATE: 10/31/2021   REFERRING DIAG: I61.9 (ICD-10-CM) - Nontraumatic thalamic hemorrhage (HCC) G81.94 (ICD-10-CM) - Left hemiparesis (HCC)   THERAPY DIAG:  Muscle weakness (generalized)  Unsteadiness on feet  Other abnormalities of gait and mobility  Rationale for Evaluation and Treatment Rehabilitation  SUBJECTIVE:                                                                                                                                                                                              SUBJECTIVE STATEMENT:  Pt agreeable to D/C. Patient reports last Saturday he felt a pop in his R knee. Thinks it was due to overcompensating. Patient plans to follow up with ortho for possible surgery.   Pt accompanied by: self  PERTINENT HISTORY: HTN, Noischemic dilated cardiomyopathy, chronic combined CHF, ETOH, hyperlipidemia, ICH, seizures, CVA 06/09/2020  PAIN:  Are you having pain? Yes: NPRS scale: 7/10 Pain location: R knee pain Pain description: pulsating, needles Aggravating factors: no movement Relieving factors: walking  PRECAUTIONS: Fall  WEIGHT BEARING RESTRICTIONS No  FALLS: Has patient fallen in last 6 months? Yes. Number of falls 1-he was holding a door and LOB, was able to get up by self.  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 Expected reduction in size of hemorrhage compared to 06/19/20. No acute or new findings.  COGNITION: Overall cognitive status: Impaired   SENSATION: Light touch: Impaired  and decreased sensation on L side  COORDINATION: Dysmetric with heel to shin on LLE  EDEMA:  WFL  MUSCLE TONE: LLE spasticity noted during ambulation, mild catch in seated  POSTURE: forward head  LOWER EXTREMITY ROM:     L hemiparetic side = mild decreased hip extension, dorsiflexion, and increased hamstring tightness (varies given tone)  LOWER EXTREMITY  MMT:    MMT Right Eval Left Eval  Hip flexion 4+/5 3+/5  Hip extension    Hip abduction 4/5 3+/5  Hip adduction 4+/5 3+/5  Hip internal rotation  Hip external rotation    Knee flexion 5/5 4-/5  Knee extension 5/5 4+/5  Ankle dorsiflexion 5/5 3-/5  Ankle plantarflexion    Ankle inversion    Ankle eversion    (Blank rows = not tested)  BED MOBILITY:  Sit to supine Complete Independence Supine to sit Complete Independence Pt reports independence.  TRANSFERS: Assistive device utilized:  Writer tip cane   Sit to stand: SBA Stand to sit: SBA Chair to chair: SBA  GAIT: Gait pattern: decreased hip/knee flexion- Left, decreased ankle dorsiflexion- Left, decreased trunk rotation, wide BOS, abducted- Left, and poor foot clearance- Left Distance walked: various clinic distances Assistive device utilized: small rubber tip cane, none Level of assistance: SBA Comments: Compensates well both with/without AD, decreased LE on L but no major difference noted over short distances in today's session   STAIRS: Level of Assistance: CGA Stair Negotiation Technique: Step to Pattern with Single Rail on Right Number of Stairs: 4  Height of Stairs: 6"  Comments: CGA with min signs of instability, steps up with right and down with L  FUNCTIONAL TESTs:     08/15/22 0001  Standardized Balance Assessment  Standardized Balance Assessment Five Times Sit to Stand;TUG;10 meter walk test  Five times sit to stand comments  11.20 (seconds without UE use (bias towards RLE))  10 Meter Walk 0.69 (m/s with SPC quad tip cane with SBA (0.67 m/s without AD with SBA))    PATIENT SURVEYS:  FOTO not applicable due to chronic nature of CVA  TODAY'S TREATMENT:   Vitals:   10/17/22 1244  BP: 118/68  Pulse: 73    Held assessing due to acute knee pain as plan was to assess without   TherEx: - LAQ 2 x 5  - Heel slides x 10  - minor tenderness to palpation above knee joint; no major  joint line tenderness; reviewed progress towards goals and recommend use of SPC until patient follow up with ortho  PATIENT EDUCATION: Education details:  Continue HEP as able pending knee + continue use of SPC Person educated: Patient Education method: Explanation, demonstration and handout Education comprehension: verbalized understanding and needs further education   HOME EXERCISE PROGRAM:  Access Code: [email or web address] URL: [email or web address] Date: 08/30/2022 Prepared by: Camille Bal  Exercises - Sit to/from Stand in Stride position  - 1 x daily - 5 x weekly - 1 sets - 10 reps - Standing Single Leg Stance with Counter Support  - 1 x daily - 5 x weekly - 1 sets - 3 reps - 10 hold - Heel Raises with Counter Support  - 1 x daily - 4-5 x weekly - 2 sets - 10 reps - Supine Bridge  - 1 x daily - 5 x weekly - 2 sets - 10 reps  Access Code: [email or web address] URL: [email or web address] Date: 09/07/2022 Prepared by: Maebelle Munroe  Exercises - Seated Knee Flexion with Anchored Resistance  - 1 x daily - 7 x weekly - 2 sets - 10 reps - 2-3 sec hold - Seated Knee Extension with Resistance  - 1 x daily - 7 x weekly - 2 sets - 10 reps - 3 sec hold - Seated Ankle Dorsiflexion with Resistance  - 1 x daily - 7 x weekly - 2 sets - 10 reps - 3 sec hold  GOALS: Goals reviewed with patient? Yes  SHORT TERM GOALS: Target date: 09/07/2022  Pt will be independent with strength and balance HEP to promote maintenance of gains outside the  clinic.  Baseline:  2 HEPs provided (09/13/2022) Goal status: IN PROGRESS   2. Therapist will assess without AD / write LTG as indicated  Baseline: Assessed 8/7 w/ LTG set. Goal status: MET  LONG TERM GOALS: Target date: 10-19-22  Pt to be independent with advance strength and balance HEP. Baseline: Reports exercises at home are going well thus far; reports feeling confident in HEP Goal status: MET  2.  Patient will improve gait speed  to 0.8 m/s with LRAD to indicate improvement to the level of community ambulator in order to participate more easily in activities outside of the home.   Baseline: 0.69 m/s with rubber tip quad cane; 0.85 m/s with rubber tip quad cane Goal status: MET  3.  Patient will improve ability to ascend/descend 2 x 4 stairs with SBA and single use of rail with step to pattern to improve access to community. Baseline: CGA x 4 stairs; SBA 2 x 4 steps Goal status: MET  4.  Pt will ambulate >/= 765 feet on to demonstrate improved functional endurance for home and community participation. Baseline: 665 ft CGA no AD (8/7); improved to 846 ft SBA with SPC rubber quad tip cane (SBA) - plan to assess without AD next session  Goal status: Unable to assess at D/C due to acute knee pain, adequate progress from prior sessions  ASSESSMENT:  CLINICAL IMPRESSION: Patient D/C from today due to adequate progress towards LTGs. Patient does present to today's session with acute knee pain but patient to follow up with ortho medical team for management and recommendations. Patient encouraged to continue to use SPC quad tip cane for safety. Patient verbalizes understanding and agreeable to D/C.   OBJECTIVE IMPAIRMENTS Abnormal gait, decreased activity tolerance, decreased balance, decreased coordination, decreased endurance, decreased mobility, difficulty walking, decreased strength, decreased safety awareness, increased edema, impaired sensation, impaired tone, improper body mechanics, and postural dysfunction.   ACTIVITY LIMITATIONS carrying, lifting, stairs, transfers, and locomotion level  PARTICIPATION LIMITATIONS: community activity and occupation  PERSONAL FACTORS Age, Fitness, Past/current experiences, Time since onset of injury/illness/exacerbation, and 1-2 comorbidities: HTN, chronic CHF/cardiomyopathy  are also affecting patient's functional outcome.   REHAB POTENTIAL: Fair See personal factors  CLINICAL  DECISION MAKING: Stable/uncomplicated  EVALUATION COMPLEXITY: Low  PLAN: PT FREQUENCY: 1x/week  PT DURATION: 6 weeks  PLANNED INTERVENTIONS: Therapeutic exercises, Therapeutic activity, Neuromuscular re-education, Balance training, Gait training, Patient/Family education, Self Care, Joint mobilization, Stair training, Vestibular training, Orthotic/Fit training, DME instructions, Electrical stimulation, Manual therapy, and Re-evaluation  PLAN FOR NEXT SESSION:   NA - patient D/C with HEP  Carmelia Bake, PT, DPT 10/17/2022, 1:09 PM   Check all possible CPT codes: 46962 - PT Re-evaluation, 97110- Therapeutic Exercise, [phone]- Neuro Re-education, [phone] - Gait Training, [phone] - Manual Therapy, [phone] - Therapeutic Activities, and [phone] - Self Care    Check all conditions that are expected to impact treatment: {Conditions expected to impact treatment:Contractures, spasticity or fracture relevant to requested treatment and Neurological condition and/or seizures   If treatment provided at initial evaluation, no treatment charged due to lack of authorization.

## 2022-10-24 ENCOUNTER — Encounter: Payer: Self-pay | Admitting: Neurology

## 2022-10-24 ENCOUNTER — Encounter: Payer: Self-pay | Admitting: Physical Medicine and Rehabilitation

## 2022-10-24 ENCOUNTER — Encounter
Payer: Medicaid Other | Attending: Physical Medicine and Rehabilitation | Admitting: Physical Medicine and Rehabilitation

## 2022-10-24 ENCOUNTER — Ambulatory Visit: Payer: Medicaid Other | Attending: Physical Medicine and Rehabilitation | Admitting: Occupational Therapy

## 2022-10-24 ENCOUNTER — Ambulatory Visit: Payer: Medicaid Other | Admitting: Neurology

## 2022-10-24 VITALS — BP 111/68 | HR 85 | Ht 70.0 in | Wt 192.0 lb

## 2022-10-24 VITALS — BP 138/69 | HR 79 | Ht 70.0 in | Wt 195.8 lb

## 2022-10-24 DIAGNOSIS — I639 Cerebral infarction, unspecified: Secondary | ICD-10-CM | POA: Diagnosis present

## 2022-10-24 DIAGNOSIS — I10 Essential (primary) hypertension: Secondary | ICD-10-CM | POA: Insufficient documentation

## 2022-10-24 DIAGNOSIS — R278 Other lack of coordination: Secondary | ICD-10-CM | POA: Insufficient documentation

## 2022-10-24 DIAGNOSIS — M6281 Muscle weakness (generalized): Secondary | ICD-10-CM | POA: Diagnosis present

## 2022-10-24 DIAGNOSIS — R29898 Other symptoms and signs involving the musculoskeletal system: Secondary | ICD-10-CM | POA: Insufficient documentation

## 2022-10-24 DIAGNOSIS — Z630 Problems in relationship with spouse or partner: Secondary | ICD-10-CM | POA: Diagnosis present

## 2022-10-24 DIAGNOSIS — G8929 Other chronic pain: Secondary | ICD-10-CM | POA: Insufficient documentation

## 2022-10-24 DIAGNOSIS — R208 Other disturbances of skin sensation: Secondary | ICD-10-CM | POA: Insufficient documentation

## 2022-10-24 DIAGNOSIS — I69154 Hemiplegia and hemiparesis following nontraumatic intracerebral hemorrhage affecting left non-dominant side: Secondary | ICD-10-CM | POA: Insufficient documentation

## 2022-10-24 DIAGNOSIS — I69954 Hemiplegia and hemiparesis following unspecified cerebrovascular disease affecting left non-dominant side: Secondary | ICD-10-CM | POA: Insufficient documentation

## 2022-10-24 DIAGNOSIS — R569 Unspecified convulsions: Secondary | ICD-10-CM

## 2022-10-24 DIAGNOSIS — I69354 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-dominant side: Secondary | ICD-10-CM | POA: Diagnosis present

## 2022-10-24 DIAGNOSIS — M25512 Pain in left shoulder: Secondary | ICD-10-CM | POA: Insufficient documentation

## 2022-10-24 DIAGNOSIS — R52 Pain, unspecified: Secondary | ICD-10-CM | POA: Diagnosis present

## 2022-10-24 DIAGNOSIS — R29818 Other symptoms and signs involving the nervous system: Secondary | ICD-10-CM | POA: Insufficient documentation

## 2022-10-24 DIAGNOSIS — I629 Nontraumatic intracranial hemorrhage, unspecified: Secondary | ICD-10-CM

## 2022-10-24 DIAGNOSIS — I69391 Dysphagia following cerebral infarction: Secondary | ICD-10-CM

## 2022-10-24 DIAGNOSIS — G5602 Carpal tunnel syndrome, left upper limb: Secondary | ICD-10-CM | POA: Insufficient documentation

## 2022-10-24 DIAGNOSIS — M25561 Pain in right knee: Secondary | ICD-10-CM | POA: Insufficient documentation
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
## 2022-10-24 NOTE — Progress Notes (Signed)
Chief Complaint  Patient presents with   New Patient (Initial Visit)    Rm15, alone, internal referral for ongoing dysfunction of the left upper extremity after recent stroke: pt stated that he stopped the clonidine bc ex wife was extra medicating him to knock him out. [facility]      ASSESSMENT AND PLAN  [name] is a 57 y.o. 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   MEDICAL HISTORY:  [name], is a 57 year old male, seen in request by his primary care nurse practitioner Zonia Kief, Amy J, for evaluation of worsening dysarthria, left arm function,  History is obtained from the patient and review of electronic medical records. I personally reviewed pertinent available imaging films in PACS.   PMHx of  Right thalamic bleeding in March 2022 HTN HLD CHF Seizure Used to drink beer 40 oz daily  He had a history of right thalamic intracranial bleeding with intraventricular breakthrough, symptomatic seizure in March 2022, with residual spastic left arm and leg weakness, neuropathic pain, significant left shoulder pain  MRI of left shoulder without contrast in March 2023 showed mild to moderate rotator cuff tendinosis without tear, severe intra-articular biceps tendinosis with mild  tenosynovitis, mild glenohumeral and mild to moderate AC joint osteoarthritis  MRI of the brain in March 2023 chronic right thalamic hemorrhage with encephalomalacia hemosiderin deposition, moderate small vessel disease involving periventricular white matter, pons,  Acute phase MRI with without contrast in May 2022 showed acute right thalamic hemorrhage measuring up to 3.8 x 2.4 cm, intraventricular extension MRA of brain showed no vascular malformation,  He was able to ambulate, with some recovering of left upper extremity function, he fell in June 2024, backwards landed with his left elbow on the concrete floor, had severe left elbow pain, numbness tingling from left elbow down since then, seems to have worsening left hand function, harder for him to spread and extend his fingers, now he wear left wrist splint,  X-ray of left elbow was somewhat limited by his slight flexed position, the  radiocapitellar and ulnohumeral joints are well located.  There is notable  ossification at the posterior aspect of the olecranon at the triceps  insertion.   PHYSICAL EXAM:   Vitals:   10/24/22 0929  BP: 138/69  Pulse: 79  Weight: 195 lb 12.8 oz (88.8 kg)  Height: 5\' 10"  (1.778 m)     Body mass index is 28.09 kg/m.  PHYSICAL EXAMNIATION:  Gen: NAD, conversant, well nourised, well groomed                     Cardiovascular: Regular rate rhythm, no peripheral edema, warm, nontender. Eyes: Conjunctivae clear without exudates or hemorrhage Neck: Supple, no carotid bruits. Pulmonary: Clear to auscultation bilaterally   NEUROLOGICAL EXAM:  MENTAL STATUS: Speech/cognition: Awake, alert, oriented  to history taking and casual conversation, no dysarthria CRANIAL NERVES: CN II: Visual fields are full to confrontation. Pupils are round equal and briskly reactive to light. CN [name], IV, VI: extraocular movement are normal. No ptosis. CN V: Facial sensation is intact to light touch CN VII: Mild left  lower face weakness CN VIII: Hearing is normal to causal conversation. CN IX, X: Phonation is normal. CN XI: Head turning and shoulder shrug are intact  MOTOR: Spastic left hemiparesis, limited range of motion of left shoulder, mild left shoulder abduction, external rotation weakness, tendency for left arm pronation, wrist flexion, left elbow extension 5, flexion 4, wrist extension 3, flexion 4, finger abduction 3  Moderate spasticity of left lower extremity, profound left hip flexion weakness, mild left ankle dorsiflexion weakness  REFLEXES: Hyperreflexia of left upper and lower extremity SENSORY: Decreased light touch on the right side  COORDINATION: There is no trunk or limb dysmetria noted.  GAIT/STANCE: Push-up to get up from seated position, holding left elbow flexion pronation, wrist flexion, high step, spastic left leg, difficulty clear from the floor  REVIEW OF SYSTEMS:  Full 14 system review of systems performed and notable only for as above All other review of systems were negative.   ALLERGIES: Allergies  Allergen Reactions   Tamsulosin     Other Reaction(s): Not available    HOME MEDICATIONS: Current Outpatient Medications  Medication Sig Dispense Refill   amLODipine (NORVASC) 2.5 MG tablet Take 1 tablet (2.5 mg total) by mouth daily. 90 tablet 3   atorvastatin (LIPITOR) 80 MG tablet Take 1 tablet (80 mg total) by mouth daily. 90 tablet 1   baclofen (LIORESAL) 10 MG tablet Take 1 tablet (10 mg total) by mouth at bedtime. 90 each 3   diclofenac Sodium (VOLTAREN) 1 % GEL Apply 2 g topically 4 (four) times daily. 200 g 0   DULoxetine (CYMBALTA) 20 MG capsule Take 1 capsule (20 mg total) by mouth daily. 90 capsule 3   folic acid (FOLVITE) 1 MG tablet Take 1 tablet by mouth once daily 30 tablet 3   gabapentin (NEURONTIN) 300 MG capsule Take 1 capsule (300 mg total) by mouth 3 (three) times daily. 270 capsule 3   labetalol (NORMODYNE) 100 MG tablet Take 1 tablet (100 mg  total) by mouth 2 (two) times daily. 180 tablet 0   levETIRAcetam (KEPPRA) 500 MG tablet Take 1 tablet (500 mg total) by mouth 2 (two) times daily. 180 tablet 3   losartan (COZAAR) 50 MG tablet Take 1 tablet by mouth twice daily 180 tablet 0   Multiple Vitamins-Minerals (CERTAVITE/ANTIOXIDANTS) TABS Take 1 tablet by mouth daily. 30 tablet 0   tadalafil (CIALIS) 5 MG tablet Take 5 mg by mouth every morning.     tamsulosin (FLOMAX) 0.4 MG CAPS capsule TAKE 1 CAPSULE BY MOUTH ONCE DAILY AFTER SUPPER 30 capsule 0   thiamine 100 MG tablet Take 1 tablet (100 mg total) by mouth daily. 30 tablet 0   tiZANidine (ZANAFLEX) 4 MG tablet Take 1 tablet (4 mg total) by mouth at bedtime as needed for muscle spasms. 30 tablet 0   topiramate (TOPAMAX) 50 MG tablet Take 1 tablet (50 mg total) by mouth 2 (two) times daily. 60 tablet 5   No current facility-administered medications for this visit.    PAST MEDICAL HISTORY: Past Medical History:  Diagnosis Date   Anxiety    Cataract    removed left eye   Chronic combined systolic and diastolic CHF (congestive  heart failure) (HCC)    EF 25-30%   Diastolic dysfunction    Dyslipidemia    Hyperlipidemia    Hypertension    Neuromuscular disorder (HCC)    neuropathy left foot   Nonischemic dilated cardiomyopathy (HCC)    felt secondary to HTN with no ischemia on nuclear stress test 05/2012, EF 25-30%   Seizures (HCC)    last seizure 05-2020- on Keppra   Shortness of breath    Stroke (HCC) 05/2020    PAST SURGICAL HISTORY: Past Surgical History:  Procedure Laterality Date   COLONOSCOPY WITH PROPOFOL N/A 12/09/2020   Procedure: COLONOSCOPY WITH PROPOFOL;  Surgeon: Benancio Deeds, MD;  Location: [facility];  Service: Gastroenterology;  Laterality: N/A;   KNEE ARTHROSCOPY Left 04/13/2016   Procedure: ARTHROSCOPY KNEE WITH DEBRIDEMENT;  Surgeon: Valeria Batman, MD;  Location: [facility];  Service: Orthopedics;  Laterality: Left;    KNEE ARTHROSCOPY WITH MEDIAL MENISECTOMY Right 04/13/2016   Procedure: KNEE ARTHROSCOPY WITH MEDIAL MENISECTOMY;  Surgeon: Valeria Batman, MD;  Location: [facility];  Service: Orthopedics;  Laterality: LEFT not right knee   NASAL FRACTURE SURGERY     SHOULDER SURGERY Left     FAMILY HISTORY: Family History  Problem Relation Age of Onset   Hypertension Mother        Passed away when she is 36 secondary to hip surgery complications   CAD Mother    Heart attack Mother    Hypertension Father    Diabetes Brother    Stroke Brother    Heart attack Maternal Grandmother    Colon cancer Neg Hx    Esophageal cancer Neg Hx    Pancreatic cancer Neg Hx    Stomach cancer Neg Hx    Colon polyps Neg Hx     SOCIAL HISTORY: Social History   Socioeconomic History   Marital status: Married    Spouse name: Not on file   Number of children: Not on file   Years of education: Not on file   Highest education level: Not on file  Occupational History   Not on file  Tobacco Use   Smoking status: Former    Types: Cigarettes    Passive exposure: Past   Smokeless tobacco: Never  Vaping Use   Vaping status: Never Used  Substance and Sexual Activity   Alcohol use: Not Currently   Drug use: Not Currently    Types: Cocaine, Marijuana, Oxycodone   Sexual activity: Yes  Other Topics Concern   Not on file  Social History Narrative   Not on file   Social Determinants of Health   Financial Resource Strain: Not on file  Food Insecurity: Not on file  Transportation Needs: Not on file  Physical Activity: Not on file  Stress: Not on file  Social Connections: Unknown (06/07/2021)   Received from [facility], [facility]   Social Network    Social Network: Not on file  Intimate Partner Violence: Unknown (04/28/2021)   Received from [facility], [facility]   HITS    Physically Hurt: Not on file    Insult or Talk Down To: Not on file    Threaten Physical Harm: Not on file     Scream or Curse: Not on file      Levert Feinstein, M.D. Ph.D.  [facility] [address] [place], [place] [address] Ph: [phone] Fax: [phone]  CC:  Rema Fendt, NP [name] [place],  [place] [address]  Rema Fendt, NP

## 2022-10-24 NOTE — Patient Instructions (Signed)
D/c tiznaidine

## 2022-10-24 NOTE — Therapy (Signed)
OUTPATIENT OCCUPATIONAL THERAPY NEURO TREATMENT  Patient Name: [name] MRN: [id number] DOB:May 29, 1965, 57 y.o., male Today's Date: 10/24/2022  PCP: Rema Fendt, NP REFERRING PROVIDER: Horton Chin, MD    OT End of Session - 10/24/22 1110     Visit Number 5    Number of Visits 8    Date for OT Re-Evaluation 11/10/22    Authorization Type [place] Medicaid - requires auth    Authorization Time Period through 11/17/2022    Authorization - Number of Visits 9    OT Start Time 1106    OT Stop Time 1145    OT Time Calculation (min) 39 min    Activity Tolerance Patient tolerated treatment well    Behavior During Therapy WFL for tasks assessed/performed            Past Medical History:  Diagnosis Date   Anxiety    Cataract    removed left eye   Chronic combined systolic and diastolic CHF (congestive heart failure) (HCC)    EF 25-30%   Diastolic dysfunction    Dyslipidemia    Hyperlipidemia    Hypertension    Neuromuscular disorder (HCC)    neuropathy left foot   Nonischemic dilated cardiomyopathy (HCC)    felt secondary to HTN with no ischemia on nuclear stress test 05/2012, EF 25-30%   Seizures (HCC)    last seizure 05-2020- on Keppra   Shortness of breath    Stroke (HCC) 05/2020   Past Surgical History:  Procedure Laterality Date   COLONOSCOPY WITH PROPOFOL N/A 12/09/2020   Procedure: COLONOSCOPY WITH PROPOFOL;  Surgeon: Benancio Deeds, MD;  Location: [facility];  Service: Gastroenterology;  Laterality: N/A;   KNEE ARTHROSCOPY Left 04/13/2016   Procedure: ARTHROSCOPY KNEE WITH DEBRIDEMENT;  Surgeon: Valeria Batman, MD;  Location: [facility];  Service: Orthopedics;  Laterality: Left;   KNEE ARTHROSCOPY WITH MEDIAL MENISECTOMY Right 04/13/2016   Procedure: KNEE ARTHROSCOPY WITH MEDIAL MENISECTOMY;  Surgeon: Valeria Batman, MD;  Location: [facility];  Service: Orthopedics;  Laterality: LEFT not right knee   NASAL  FRACTURE SURGERY     SHOULDER SURGERY Left    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) 10/24/2022   Chronic diastolic CHF (congestive heart failure) (HCC) 11/07/2021   Left ventricular hypertrophy 11/07/2021   Osteoarthritis of left glenohumeral joint 05/04/2021   Colon cancer screening    Left hemiplegia (HCC) 07/02/2020   Cognitive deficit due to recent stroke 07/02/2020   Dysphagia, post-stroke    Vascular headache    Acute blood loss anemia    AKI (acute kidney injury) (HCC)    Essential hypertension    Partial seizure (HCC)    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   Intracranial hemorrhage (HCC) 06/09/2020   Sprain of anterior talofibular ligament of left ankle 01/15/2020   Hyperlipidemia, on Lipitor 04/01/2017   Alcohol use disorder, mild, abuse 06/30/2016   Morbid obesity (HCC) 06/30/2016   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   Chronic combined systolic and diastolic CHF (congestive heart failure) (HCC) 11/08/2012   HTN (hypertension) 05/25/2012    ONSET DATE: 06/08/20   REFERRING DIAG: G81.94 (ICD-10-CM) - Left hemiparesis  THERAPY DIAG:  Muscle weakness (generalized)  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-dominant side (HCC)  Other lack of coordination  Other symptoms and signs involving the musculoskeletal system  Chronic left shoulder pain  Hemiplegia and hemiparesis following nontraumatic intracerebral hemorrhage affecting left non-dominant side (HCC)  Other disturbances of skin sensation  Other symptoms and signs involving the nervous system  Rationale for Evaluation and Treatment Rehabilitation  SUBJECTIVE:   SUBJECTIVE STATEMENT: Pt reports he is to have EMG and MRI per his neurology visit today.   Pt saw doctor yesterday and has a wrist cock up splint for 'wrist drop' with referral to see  neurology.  Based on his clinical examination today, there is concern for a more centralized source of nerve pathology.  From a peripheral nerve standpoint, there is a possibility for ongoing radial nerve palsy based on his examination, however given the prior stroke history with known left hemiplegia and more recent worsening symptoms, I would like him to be reevaluated by neurology to rule out CNS or root pathology that could be contributing to his ongoing progressive symptoms.   Given that he has sensory and motor deficits to both the upper and lower extremity, we will place referral back to neurology for repeat evaluation.  From a peripheral nerve/hand surgery perspective, I will have him fitted today for a wrist brace to help with ongoing wrist drop.  I am happy to see him back in the future should we rule out any central nervous system further workup/treatment for consideration of potential left upper extremity nerve study to look for notable sites of compression.   We did discuss etiology and pathophysiology of neuropraxia within the left upper extremity.  There may be an element of recovery that he can regain with time, however given that his symptoms may be worsening, this is why I would like him to be reevaluated by neurology.   In the future, he would also be a reasonable candidate for discussion regarding tendon transfers in order to help with wrist drop, however if there is an underlying centralized component, this may preclude our ability to utilize other structures within the upper extremity.  He did not report getting an injection also but MR review reveals Ortho appt re: shoulder arthritis: He certainly has a very difficult situation regarding his residual neurologic deficits and underlying shoulder arthritis. I suspect that his arthritis has been exacerbated by his previous fall as well as the dyskinesis related to his CVA with spasticity. We have discussed at length the long-term  prognosis and treatment options. I recommended an intra-articular injection for symptomatic relief. He is in agreement and this is performed today using standard technique.   We have discussed the importance of activity modifications on a long-term basis. Given the fact that he has a significant left hemiparesis I am hopeful that the injection will provide long-term benefit. All questions are encouraged and answered and at this time by mutual agreement we will plan to see him back on an as-needed basis.   Pt accompanied by: self  PERTINENT HISTORY: Update 10/31/2021 JM: Patient returns for follow-up after prior visit 7 months ago regarding history of ICH, seizures and headaches.  He is unaccompanied.  Reports residual left sided weakness and nerve pain. Was previously just in foot, but now going up his whole leg to his hip.  At prior visit with PMR, recommended trialing different medications but he wishes to try to avoid medications if able.  He questions restarting therapies.  Continues to ambulate with a cane, denies any recent falls.  Does mention left side of face and tongue feels swollen over the past 2 weeks, denies any change in diet  or medications.  Denies any difficulty breathing or swallowing. Compliant on Keppra, no seizure activity. Has not had any recent headaches, currently on topiramate 50 mg twice daily. On atorvastatin, denies side effects.  Blood pressure well controlled, today 112/65.  Is being seen by cardiology next week for further blood pressure management.  No further concerns at this time.  PRECAUTIONS: Fall  WEIGHT BEARING RESTRICTIONS: No  PAIN:  Are you having pain? Yes: NPRS scale: 5/10 Pain location: L side of body/knee Pain description: Not particularly pain but tingling and numbness (daily though) Aggravating factors: cold Relieving factors: Gabapentin 3x/day , Voltaren (just got some over the counter)  FALLS: Has patient fallen in last 6 months? Yes. Number of falls  1 - 2-3 months ago - fall bumping L side  LIVING ENVIRONMENT: [facility] with: [facility] Lives in: one level dorm Stairs: No Has following equipment at home: Counselling psychologist, Hemi walker, Wheelchair (manual), and Shower chair (no back)  PLOF: Requires assistive device for independence and Vocation/Vocational requirements: pt is on disability  PATIENT GOALS: improve use of LUE and overall independence with completion of functional activities.  OBJECTIVE:   MRI results:  1. Mild-moderate rotator cuff tendinosis without tear. 2. Severe intra-articular biceps tendinosis with mild tenosynovitis. 3. 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  HAND DOMINANCE: Right  ADLs: Eating: needs assist to cut food Grooming: mod I  UB Dressing: mod I, difficulty w/ buttons LB Dressing: assist to tie shoes Toileting: mod I  Bathing: mod I w/ difficulty getting under Rt arm Tub Shower transfers: mod I  Equipment: Shower seat with back  IADLs: Shopping: mod independent Light housekeeping: mod I  Meal Prep: uses air fryer, [place] [employer]es, microwave Community mobility: drives I'ly Medication management: mod I  Handwriting:  denies change  MOBILITY STATUS: Independent; uses quad cane; increased time   ACTIVITY TOLERANCE: Activity tolerance: good  FUNCTIONAL OUTCOME MEASURES: Quick Dash: 63.6 % disability with use of LUE   UPPER EXTREMITY ROM     AROM Right eval Left eval  Shoulder flexion WNL 120* with ataxia  Shoulder abduction WNL 90* with ataxia reports limiting ROM in this direction as his shoulder pops out of socket  Elbow flexion WNL 132*  Elbow extension WNL -36*  Wrist flexion WNL WFL  Wrist extension WNL N/A past neutral  Wrist pronation WNL WFL  Wrist supination WNL 60* with time  (Blank rows = not tested)  L Digit flex and ext: [facility]; Digit opposition: BFL, very ataxic  UPPER EXTREMITY MMT:     MMT Right eval Left eval  Shoulder  flexion WNL WFL within available ROM  Shoulder abduction WNL "  Elbow flexion WNL WNL  Elbow extension WNL WNL  (Blank rows = not tested)  HAND FUNCTION: Grip strength: Right: 110.6 lbs; Left: 28.4 lbs  COORDINATION: Box and Blocks:  Right 47blocks, Left 8blocks  SENSATION: Light touch: abscent Proprioception: Impaired   EDEMA: none reported or observed  MUSCLE TONE: LUE: Mild and Hypertonic  COGNITION: Overall cognitive status: Within functional limits for tasks assessed  VISION: Subjective report: No change Baseline vision: Wears glasses for reading only Visual history: corrective eye surgery  VISION ASSESSMENT: Not tested - denies change   OBSERVATIONS: Pt holds LUE in guarded positioning to L side of body. No volitional use with completion of functional activities.    TODAY'S TREATMENT:  Therapist utilized e-stim Russian waveform with 10/10 cycle time, 50% duty cycle, burst frequency of 50 bps, and ramp of 2 seconds. Patient tolerating up to 34 intensity with supervised application to promote improved AROM and pain management. Therapist cued patient to complete AAROM composite flexion during off cycle and composite extension during on cycle to replicate functional grasp and release of the affected hand. Electrodes placed for L wrist and digit extension.   Objective measures assessed as noted in Goals section to determine progression towards goals. OT educated pt on completion of isometric L wrist extension stretch on side of wooden box. Pt also completing with R side for comparison. Pt requiring increased time and cueing for proper completion.  PATIENT EDUCATION: Education details: Estim use; L wrist stretch Person educated: Patient Education method: Explanation, Demonstration, and Handouts Education comprehension: verbalized understanding and needs further education  HOME EXERCISE  PROGRAM: 09/20/26 Started HEP with Shoulder Shrugs Access Code: [email or web address]  GOALS:  SHORT TERM GOALS: Target date: 10/11/2022    Patient will demonstrate LUE ROM HEP with 25% verbal cues or less for proper execution. Baseline: Does not have handouts Goal status: IN PROGRESS  2.  Pt will demonstrate ability to pick up 9 hole peg and place into board in under 50 seconds Baseline: lacks coordination due to ataxia and limited opposition Goal status: IN PROGRESS  3.  Pt to verbalize understanding of safety considerations d/t sensory loss LUE Baseline: Not yet initiated Goal status: INITIAL   LONG TERM GOALS: Target date:  11/10/2022  1. Patient will demonstrate updated LUE HEP with 25% verbal cues or less for proper execution.  Baseline: Not yet initiated Goal status: IN Progress  2.  Patient will demonstrate at least 16%  improvement with quick Dash score (reporting 47.6% disability or less) indicating improved functional use of affected extremity.  Baseline: 63.6 % disability with use of LUE Goal status: INITIAL  3.  Will improve Box and Blocks by at least 4 blocks.  Baseline: 8 blocks 10/24/2022: 14 blocks Goal status: MET  4.  Pt will perform home management and stovetop based meal prep with good safety awareness and use of modifications/DME as needed.   Baseline: uses appliances Goal status: INITIAL   ASSESSMENT:  CLINICAL IMPRESSION: Patient demonstrates need for increased time and cueing with completion of L wrist stretches. Will await results of MRI and adjust OT POC as needed.   PERFORMANCE DEFICITS: in functional skills including ADLs, IADLs, coordination, sensation, ROM, strength, pain, Fine motor control, Gross motor control, mobility, balance, and UE functional use.  IMPAIRMENTS: are limiting patient from ADLs, IADLs, and leisure.   CO-MORBIDITIES; may have co-morbidities  that affects occupational performance. Patient will benefit from skilled OT to address  above impairments and improve overall function.  REHAB POTENTIAL: Fair secondary to insurance visit limits and limited caregiver assistance/home environment  PLAN:  OT FREQUENCY: 1x/week   OT DURATION: 7 weeks   PLANNED INTERVENTIONS: self care/ADL training, therapeutic exercise, therapeutic activity, neuromuscular re-education, manual therapy, functional mobility training, electrical stimulation, patient/family education, DME and/or AE instructions, and Re-evaluation  RECOMMENDED OTHER SERVICES: n/a  CONSULTED AND AGREED WITH PLAN OF CARE: Patient  PLAN FOR NEXT SESSION: L wrist stretches (update to MRI?) Review putty, progress and update HEP to put in binder  Info re: Stroke Support group Coordination activity modifications   Delana Meyer, OT 10/24/2022, 5:38 PM

## 2022-10-26 ENCOUNTER — Telehealth: Payer: Self-pay | Admitting: Neurology

## 2022-10-26 NOTE — Telephone Encounter (Signed)
MRI brain healthy blue [facility]: [id number] exp. 10/26/22-12/24/22  MRA neck healthy blue [id number] exp. 10/26/22-12/24/22 sent to GI [phone]

## 2022-10-30 ENCOUNTER — Other Ambulatory Visit: Payer: Self-pay | Admitting: Physical Medicine and Rehabilitation

## 2022-10-31 ENCOUNTER — Ambulatory Visit: Payer: Medicaid Other | Admitting: Occupational Therapy

## 2022-10-31 DIAGNOSIS — G8929 Other chronic pain: Secondary | ICD-10-CM

## 2022-10-31 DIAGNOSIS — R278 Other lack of coordination: Secondary | ICD-10-CM

## 2022-10-31 DIAGNOSIS — M6281 Muscle weakness (generalized): Secondary | ICD-10-CM | POA: Diagnosis not present

## 2022-10-31 DIAGNOSIS — I69354 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-dominant side: Secondary | ICD-10-CM

## 2022-10-31 NOTE — Patient Instructions (Addendum)
L UE ROM Exercises  Access Code: [email or web address] URL: [email or web address] Date: 10/31/2022 Prepared by: Amada Kingfisher  Exercises - Standing Shoulder Shrugs  - 3 x daily - 10 reps - Seated Elbow Flexion and Extension AROM  - 2 x daily - 10 reps - Forearm Pronation and Supination with Hammer  - 2 x daily - 10 reps - Standing Wrist Radial Deviation with Hammer  - 1 x daily - 10 reps - Standing Wrist Ulnar Deviation with Hammer  - 1 x daily - 10 reps - Gripping Sponge Pronated  - 1 x daily - 10 reps

## 2022-10-31 NOTE — Therapy (Signed)
OUTPATIENT OCCUPATIONAL THERAPY NEURO TREATMENT  Patient Name: [name] MRN: [id number] DOB:[birth year], 57 y.o., male Today's Date: 10/31/2022  PCP: Rema Fendt, NP REFERRING PROVIDER: Horton Chin, MD    OT End of Session - 10/31/22 1114     Visit Number 6    Number of Visits 8    Date for OT Re-Evaluation 11/10/22    Authorization Type [place] [employer] - requires auth    Authorization Time Period through 11/17/2022    Authorization - Number of Visits 9    OT Start Time 1115    OT Stop Time 1155    OT Time Calculation (min) 40 min    Equipment Utilized During Treatment dynamometer    Activity Tolerance Patient tolerated treatment well    Behavior During Therapy WFL for tasks assessed/performed            Past Medical History:  Diagnosis Date   Anxiety    Cataract    removed left eye   Chronic combined systolic and diastolic CHF (congestive heart failure) (HCC)    EF 25-30%   Diastolic dysfunction    Dyslipidemia    Hyperlipidemia    Hypertension    Neuromuscular disorder (HCC)    neuropathy left foot   Nonischemic dilated cardiomyopathy (HCC)    felt secondary to HTN with no ischemia on nuclear stress test 05/2012, EF 25-30%   Seizures (HCC)    last seizure 05-2020- on Keppra   Shortness of breath    Stroke (HCC) 05/2020   Past Surgical History:  Procedure Laterality Date   COLONOSCOPY WITH PROPOFOL N/A 12/09/2020   Procedure: COLONOSCOPY WITH PROPOFOL;  Surgeon: Benancio Deeds, MD;  Location: [facility];  Service: Gastroenterology;  Laterality: N/A;   KNEE ARTHROSCOPY Left 04/13/2016   Procedure: ARTHROSCOPY KNEE WITH DEBRIDEMENT;  Surgeon: Valeria Batman, MD;  Location: [facility];  Service: Orthopedics;  Laterality: Left;   KNEE ARTHROSCOPY WITH MEDIAL MENISECTOMY Right 04/13/2016   Procedure: KNEE ARTHROSCOPY WITH MEDIAL MENISECTOMY;  Surgeon: Valeria Batman, MD;  Location: [facility];  Service:  Orthopedics;  Laterality: LEFT not right knee   NASAL FRACTURE SURGERY     SHOULDER SURGERY Left    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) 10/24/2022   Chronic diastolic CHF (congestive heart failure) (HCC) 11/07/2021   Left ventricular hypertrophy 11/07/2021   Osteoarthritis of left glenohumeral joint 05/04/2021   Colon cancer screening    Left hemiplegia (HCC) 07/02/2020   Cognitive deficit due to recent stroke 07/02/2020   Dysphagia, post-stroke    Vascular headache    Acute blood loss anemia    AKI (acute kidney injury) (HCC)    Essential hypertension    Partial seizure (HCC)    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   Intracranial hemorrhage (HCC) 06/09/2020   Sprain of anterior talofibular ligament of left ankle 01/15/2020   Hyperlipidemia, on Lipitor 04/01/2017   Alcohol use disorder, mild, abuse 06/30/2016   Morbid obesity (HCC) 06/30/2016   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   Chronic combined systolic and diastolic CHF (congestive heart failure) (HCC) 11/08/2012   HTN (hypertension) 05/25/2012    ONSET DATE: 06/08/20   REFERRING DIAG: G81.94 (ICD-10-CM) - Left hemiparesis  THERAPY DIAG:  Muscle weakness (generalized)  Other lack of coordination  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-dominant side (HCC)  Chronic  left shoulder pain  Rationale for Evaluation and Treatment Rehabilitation  SUBJECTIVE:   SUBJECTIVE STATEMENT:  Pt reports he has been going to the gym 6x/week - working each muscle group 2x/week - chest, arms and legs.  He has misplaced his wrist splint and needs to look around the [place] home to find it.   Pt reports he is to have EMG and MRI per his recent neurology visit.   Pt accompanied by: self  PERTINENT HISTORY: Update 10/31/2021 JM: Patient returns for follow-up after prior visit 7  months ago regarding history of ICH, seizures and headaches.  He is unaccompanied.  Reports residual left sided weakness and nerve pain. Was previously just in foot, but now going up his whole leg to his hip.  At prior visit with PMR, recommended trialing different medications but he wishes to try to avoid medications if able.  He questions restarting therapies.  Continues to ambulate with a cane, denies any recent falls.  Does mention left side of face and tongue feels swollen over the past 2 weeks, denies any change in diet or medications.  Denies any difficulty breathing or swallowing. Compliant on Keppra, no seizure activity. Has not had any recent headaches, currently on topiramate 50 mg twice daily. On atorvastatin, denies side effects.  Blood pressure well controlled, today 112/65.  Is being seen by cardiology next week for further blood pressure management.  No further concerns at this time.  From MD appt 10/24/22 Based on his clinical examination today, there is concern for a more centralized source of nerve pathology.  From a peripheral nerve standpoint, there is a possibility for ongoing radial nerve palsy based on his examination, however given the prior stroke history with known left hemiplegia and more recent worsening symptoms, I would like him to be reevaluated by neurology to rule out CNS or root pathology that could be contributing to his ongoing progressive symptoms.   Given that he has sensory and motor deficits to both the upper and lower extremity, we will place referral back to neurology for repeat evaluation.  From a peripheral nerve/hand surgery perspective, I will have him fitted today for a wrist brace to help with ongoing wrist drop.  I am happy to see him back in the future should we rule out any central nervous system further workup/treatment for consideration of potential left upper extremity nerve study to look for notable sites of compression.   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  There may be an element of recovery that he can regain with time, however given that his symptoms may be worsening, this is why I would like him to be reevaluated by neurology.   In the future, he would also be a reasonable candidate for discussion regarding tendon transfers in order to help with wrist drop, however if there is an underlying centralized component, this may preclude our ability to utilize other structures within the upper extremity.  He certainly has a very difficult situation regarding his residual neurologic deficits and underlying shoulder arthritis. I suspect that his arthritis has been exacerbated by his previous fall as well as the dyskinesis related to his CVA with spasticity. We have discussed at length the long-term prognosis and treatment options. I recommended an intra-articular injection for symptomatic relief. He is in agreement and this is performed today using standard technique.   We have discussed the importance of activity modifications on a long-term basis. Given the fact that he  has a significant left hemiparesis I am hopeful that the injection will provide long-term benefit. All questions are encouraged and answered and at this time by mutual agreement we will plan to see him back on an as-needed basis.   PRECAUTIONS: Fall  WEIGHT BEARING RESTRICTIONS: No  PAIN:  Are you having pain? Yes: NPRS scale: 5/10 Pain location: L side of body/knee Pain description: Not particularly pain but tingling and numbness (daily though) Aggravating factors: cold Relieving factors: Gabapentin 3x/day , Voltaren (just got some over the counter)  FALLS: Has patient fallen in last 6 months? Yes. Number of falls 1 - 2-3 months ago - fall bumping L side  LIVING ENVIRONMENT: [facility] with: [facility] Lives in: one level dorm Stairs: No Has following equipment at home: Counselling psychologist, Hemi walker,  Wheelchair (manual), and Shower chair (no back)  PLOF: Requires assistive device for independence and Vocation/Vocational requirements: pt is on disability  PATIENT GOALS: improve use of LUE and overall independence with completion of functional activities.  OBJECTIVE:   MRI results:  1. Mild-moderate rotator cuff tendinosis without tear. 2. Severe intra-articular biceps tendinosis with mild tenosynovitis. 3. 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  HAND DOMINANCE: Right  ADLs: Eating: needs assist to cut food Grooming: mod I  UB Dressing: mod I, difficulty w/ buttons LB Dressing: assist to tie shoes Toileting: mod I  Bathing: mod I w/ difficulty getting under Rt arm Tub Shower transfers: mod I  Equipment: Shower seat with back  IADLs: Shopping: mod independent Light housekeeping: mod I  Meal Prep: uses air fryer, [place] [employer], microwave Community mobility: drives I'ly Medication management: mod I  Handwriting:  denies change  MOBILITY STATUS: Independent; uses quad cane; increased time   ACTIVITY TOLERANCE: Activity tolerance: good  FUNCTIONAL OUTCOME MEASURES: Quick Dash: 63.6 % disability with use of LUE   UPPER EXTREMITY ROM     AROM Right eval Left eval  Shoulder flexion WNL 120* with ataxia  Shoulder abduction WNL 90* with ataxia reports limiting ROM in this direction as his shoulder pops out of socket  Elbow flexion WNL 132*  Elbow extension WNL -36*  Wrist flexion WNL WFL  Wrist extension WNL N/A past neutral  Wrist pronation WNL WFL  Wrist supination WNL 60* with time  (Blank rows = not tested)  L Digit flex and ext: [facility]; Digit opposition: BFL, very ataxic  UPPER EXTREMITY MMT:     MMT Right eval Left eval  Shoulder flexion WNL WFL within available ROM  Shoulder abduction WNL "  Elbow flexion WNL WNL  Elbow extension WNL WNL  (Blank rows = not tested)  HAND FUNCTION: Grip strength: Right: 110.6 lbs; Left: 28.4  lbs 10/31/22 Right 113.7, 114.1 lbs Left 31.0, 27.1, 29.5  COORDINATION: Box and Blocks:  Right 47 blocks, Left 8blocks  SENSATION: Light touch: abscent Proprioception: Impaired   EDEMA: none reported or observed  MUSCLE TONE: LUE: Mild and Hypertonic  COGNITION: Overall cognitive status: Within functional limits for tasks assessed  VISION: Subjective report: No change Baseline vision: Wears glasses for reading only Visual history: corrective eye surgery  VISION ASSESSMENT: Not tested - denies change   OBSERVATIONS: Pt holds LUE in guarded positioning to L side of body. No volitional use with completion of functional activities.    TODAY'S TREATMENT:  Therapeutic Activities Patient assisted to find appt schedules with info printed for him to follow for upcoming appts - cardio, PCP, neuro and therapy. Functional UE activity completed including matching and connecting socks using BUEs and then grabbing them with his L hand to place into a container.  He is able to grab a vertical cylinder but has difficulty picking up objects with forearm pronated ie) more tremulous. Info provided re: Stroke Support group which meets this week.  Therapeutic Exercises completed for duration as noted in pt instructions and reviewed below including:  Reviewed Shoulder Shrugs Introduced new exercises listed below - Seated Elbow Flexion and Extension AROM   - Forearm Pronation and Supination with Hammer   - Standing Wrist Radial Deviation with Hammer   - Standing Wrist Ulnar Deviation with Hammer   - Gripping Sponge Pronated - gripping was conducted with rubber glove with air and small rolled socks with pt engaged in working on full grasp and tripod grasp  PATIENT EDUCATION: Education details: UE exercises Person educated: Patient Education method: Programmer, multimedia, Facilities manager, and Handouts Education comprehension: verbalized  understanding, returned demonstration, verbal cues required, tactile cues required, and needs further education  HOME EXERCISE PROGRAM: 09/20/26 Started HEP with Shoulder Shrugs Access Code: [email or web address] 10/31/22 Progressed HEP for elbow, forearm, wrist and grip Same Access Code  GOALS:  SHORT TERM GOALS: Target date: 10/11/2022    Patient will demonstrate LUE ROM HEP with 25% verbal cues or less for proper execution. Baseline: Does not have handouts Goal status: IN PROGRESS  2.  Pt will demonstrate ability to pick up 9 hole peg and place into board in under 50 seconds Baseline: lacks coordination due to ataxia and limited opposition Goal status: IN PROGRESS  3.  Pt to verbalize understanding of safety considerations d/t sensory loss LUE Baseline: Not yet initiated Goal status: IN PROGRESS   LONG TERM GOALS: Target date:  11/10/2022  1. Patient will demonstrate updated LUE HEP with 25% verbal cues or less for proper execution.  Baseline: Not yet initiated Goal status: IN Progress  2.  Patient will demonstrate at least 16%  improvement with quick Dash score (reporting 47.6% disability or less) indicating improved functional use of affected extremity.  Baseline: 63.6 % disability with use of LUE Goal status: IN PROGRESS  3.  Will improve Box and Blocks by at least 4 blocks.  Baseline: 8 blocks 10/24/2022: 14 blocks Goal status: MET  4.  Pt will perform home management and stovetop based meal prep with good safety awareness and use of modifications/DME as needed.   Baseline: uses appliances Goal status: IN PROGRESS   ASSESSMENT:  CLINICAL IMPRESSION: Patient is a 57 y.o. male who was seen today for occupational therapy treatment for left hemiparesis. Patient currently presents with limited use of L UE with missing wrist splint but is able to hold a hammer for various ROM exercises today. Pt could benefit from further skilled OT services in the outpatient setting to work on  impairments as identified in order to maximize functional abilities in ADLs/IADLS. PERFORMANCE DEFICITS: in functional skills including ADLs, IADLs, coordination, sensation, ROM, strength, pain, Fine motor control, Gross motor control, mobility, balance, and UE functional use.  IMPAIRMENTS: are limiting patient from ADLs, IADLs, and leisure.   CO-MORBIDITIES; may have co-morbidities  that affects occupational performance. Patient will benefit from skilled OT to address above impairments and improve overall function.  REHAB POTENTIAL: Fair secondary to insurance visit limits and limited caregiver assistance/home environment  PLAN:  OT  FREQUENCY: 1x/week   OT DURATION: 7 weeks   PLANNED INTERVENTIONS: self care/ADL training, therapeutic exercise, therapeutic activity, neuromuscular re-education, manual therapy, functional mobility training, electrical stimulation, patient/family education, DME and/or AE instructions, and Re-evaluation  RECOMMENDED OTHER SERVICES: n/a  CONSULTED AND AGREED WITH PLAN OF CARE: Patient  PLAN FOR NEXT SESSION:  L wrist stretches (update to MRI?) Review putty, progress and update HEP to put in binder  Coordination activity modifications   Victorino Sparrow, OT 10/31/2022, 12:29 PM

## 2022-11-07 ENCOUNTER — Encounter: Payer: Medicaid Other | Admitting: Occupational Therapy

## 2022-11-09 ENCOUNTER — Ambulatory Visit: Payer: Medicaid Other | Attending: Physician Assistant

## 2022-11-09 DIAGNOSIS — I10 Essential (primary) hypertension: Secondary | ICD-10-CM

## 2022-11-09 DIAGNOSIS — I517 Cardiomegaly: Secondary | ICD-10-CM

## 2022-11-09 DIAGNOSIS — I5032 Chronic diastolic (congestive) heart failure: Secondary | ICD-10-CM

## 2022-11-10 LAB — HEPATIC FUNCTION PANEL
ALT: 24 [IU]/L (ref 0–44)
AST: 22 [IU]/L (ref 0–40)
Albumin: 4.3 g/dL (ref 3.8–4.9)
Alkaline Phosphatase: 75 [IU]/L (ref 44–121)
Bilirubin Total: 0.4 mg/dL (ref 0.0–1.2)
Bilirubin, Direct: 0.12 mg/dL (ref 0.00–0.40)
Total Protein: 6.7 g/dL (ref 6.0–8.5)

## 2022-11-10 LAB — LIPID PANEL
Chol/HDL Ratio: 2.2 {ratio} (ref 0.0–5.0)
Cholesterol, Total: 134 mg/dL (ref 100–199)
HDL: 62 mg/dL (ref >39)
LDL Chol Calc (NIH): 61 mg/dL (ref 0–99)
Triglycerides: 47 mg/dL (ref 0–149)
VLDL Cholesterol Cal: 11 mg/dL (ref 5–40)

## 2022-11-13 ENCOUNTER — Ambulatory Visit: Payer: Medicaid Other | Admitting: Occupational Therapy

## 2022-11-13 DIAGNOSIS — M6281 Muscle weakness (generalized): Secondary | ICD-10-CM | POA: Diagnosis not present

## 2022-11-13 DIAGNOSIS — R29898 Other symptoms and signs involving the musculoskeletal system: Secondary | ICD-10-CM

## 2022-11-13 DIAGNOSIS — R278 Other lack of coordination: Secondary | ICD-10-CM

## 2022-11-13 DIAGNOSIS — R29818 Other symptoms and signs involving the nervous system: Secondary | ICD-10-CM

## 2022-11-13 DIAGNOSIS — R208 Other disturbances of skin sensation: Secondary | ICD-10-CM

## 2022-11-13 DIAGNOSIS — R52 Pain, unspecified: Secondary | ICD-10-CM

## 2022-11-13 DIAGNOSIS — I69354 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-dominant side: Secondary | ICD-10-CM

## 2022-11-13 DIAGNOSIS — G8929 Other chronic pain: Secondary | ICD-10-CM

## 2022-11-13 NOTE — Therapy (Unsigned)
OUTPATIENT OCCUPATIONAL THERAPY NEURO TREATMENT AND DISCHARGE  Patient Name: [name] MRN: [id number] DOB:08/15/1965, 57 y.o., male Today's Date: 11/13/2022  PCP: Rema Fendt, NP REFERRING PROVIDER: Horton Chin, MD    OT End of Session - 11/13/22 1501     Visit Number 7    Number of Visits 8    Date for OT Re-Evaluation 11/17/22    Authorization Type [place] Medicaid - requires auth    Authorization Time Period through 11/17/2022    Authorization - Number of Visits 9    OT Start Time 1459   pt arriving late   OT Stop Time 1530    OT Time Calculation (min) 31 min    Activity Tolerance Patient tolerated treatment well    Behavior During Therapy WFL for tasks assessed/performed             Past Medical History:  Diagnosis Date   Anxiety    Cataract    removed left eye   Chronic combined systolic and diastolic CHF (congestive heart failure) (HCC)    EF 25-30%   Diastolic dysfunction    Dyslipidemia    Hyperlipidemia    Hypertension    Neuromuscular disorder (HCC)    neuropathy left foot   Nonischemic dilated cardiomyopathy (HCC)    felt secondary to HTN with no ischemia on nuclear stress test 05/2012, EF 25-30%   Seizures (HCC)    last seizure 05-2020- on Keppra   Shortness of breath    Stroke (HCC) 05/2020   Past Surgical History:  Procedure Laterality Date   COLONOSCOPY WITH PROPOFOL N/A 12/09/2020   Procedure: COLONOSCOPY WITH PROPOFOL;  Surgeon: Benancio Deeds, MD;  Location: [facility];  Service: Gastroenterology;  Laterality: N/A;   KNEE ARTHROSCOPY Left 04/13/2016   Procedure: ARTHROSCOPY KNEE WITH DEBRIDEMENT;  Surgeon: Valeria Batman, MD;  Location: [facility];  Service: Orthopedics;  Laterality: Left;   KNEE ARTHROSCOPY WITH MEDIAL MENISECTOMY Right 04/13/2016   Procedure: KNEE ARTHROSCOPY WITH MEDIAL MENISECTOMY;  Surgeon: Valeria Batman, MD;  Location: [facility];  Service: Orthopedics;  Laterality:  LEFT not right knee   NASAL FRACTURE SURGERY     SHOULDER SURGERY Left    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) 10/24/2022   Chronic diastolic CHF (congestive heart failure) (HCC) 11/07/2021   Left ventricular hypertrophy 11/07/2021   Osteoarthritis of left glenohumeral joint 05/04/2021   Colon cancer screening    Left hemiplegia (HCC) 07/02/2020   Cognitive deficit due to recent stroke 07/02/2020   Dysphagia, post-stroke    Vascular headache    Acute blood loss anemia    AKI (acute kidney injury) (HCC)    Essential hypertension    Partial seizure (HCC)    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   Intracranial hemorrhage (HCC) 06/09/2020   Sprain of anterior talofibular ligament of left ankle 01/15/2020   Hyperlipidemia, on Lipitor 04/01/2017   Alcohol use disorder, mild, abuse 06/30/2016   Morbid obesity (HCC) 06/30/2016   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   Chronic combined systolic and diastolic CHF (congestive heart failure) (HCC) 11/08/2012   HTN (hypertension) 05/25/2012   ONSET DATE: 06/08/20   REFERRING DIAG: G81.94 (ICD-10-CM) - Left hemiparesis  THERAPY DIAG:  Muscle weakness (generalized)  Other lack of coordination  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-dominant side (HCC)  Other symptoms and  signs involving the musculoskeletal system  Chronic left shoulder pain  Other disturbances of skin sensation  Other symptoms and signs involving the nervous system  Pain  Rationale for Evaluation and Treatment Rehabilitation  SUBJECTIVE:   SUBJECTIVE STATEMENT:  Pt reports he found his wrist brace. He continues to go to the [facility].   Pt reports he is to have EMG and MRI per his recent neurology visit. He has not gotten his NMES unit. He feels overall his LUE is getting worse and is tingling more.  Pt  accompanied by: self  PERTINENT HISTORY: Update 10/31/2021 JM: Patient returns for follow-up after prior visit 7 months ago regarding history of ICH, seizures and headaches.  He is unaccompanied.  Reports residual left sided weakness and nerve pain. Was previously just in foot, but now going up his whole leg to his hip.  At prior visit with PMR, recommended trialing different medications but he wishes to try to avoid medications if able.  He questions restarting therapies.  Continues to ambulate with a cane, denies any recent falls.  Does mention left side of face and tongue feels swollen over the past 2 weeks, denies any change in diet or medications.  Denies any difficulty breathing or swallowing. Compliant on Keppra, no seizure activity. Has not had any recent headaches, currently on topiramate 50 mg twice daily. On atorvastatin, denies side effects.  Blood pressure well controlled, today 112/65.  Is being seen by cardiology next week for further blood pressure management.  No further concerns at this time.  From MD appt 10/24/22 Based on his clinical examination today, there is concern for a more centralized source of nerve pathology.  From a peripheral nerve standpoint, there is a possibility for ongoing radial nerve palsy based on his examination, however given the prior stroke history with known left hemiplegia and more recent worsening symptoms, I would like him to be reevaluated by neurology to rule out CNS or root pathology that could be contributing to his ongoing progressive symptoms.   Given that he has sensory and motor deficits to both the upper and lower extremity, we will place referral back to neurology for repeat evaluation.  From a peripheral nerve/hand surgery perspective, I will have him fitted today for a wrist brace to help with ongoing wrist drop.  I am happy to see him back in the future should we rule out any central nervous system further workup/treatment for consideration of  potential left upper extremity nerve study to look for notable sites of compression.   We did discuss etiology and pathophysiology of neuropraxia within the left upper extremity.  There may be an element of recovery that he can regain with time, however given that his symptoms may be worsening, this is why I would like him to be reevaluated by neurology.   In the future, he would also be a reasonable candidate for discussion regarding tendon transfers in order to help with wrist drop, however if there is an underlying centralized component, this may preclude our ability to utilize other structures within the upper extremity.  He certainly has a very difficult situation regarding his residual neurologic deficits and underlying shoulder arthritis. I suspect that his arthritis has been exacerbated by his previous fall as well as the dyskinesis related to his CVA with spasticity. We have discussed at length the long-term prognosis and treatment options. I recommended an intra-articular injection for symptomatic relief. He is in agreement and this is performed today using standard technique.  We have discussed the importance of activity modifications on a long-term basis. Given the fact that he has a significant left hemiparesis I am hopeful that the injection will provide long-term benefit. All questions are encouraged and answered and at this time by mutual agreement we will plan to see him back on an as-needed basis.   PRECAUTIONS: Fall  WEIGHT BEARING RESTRICTIONS: No  PAIN:  Are you having pain? Yes: NPRS scale: 5/10 Pain location: L side of body/knee Pain description: Not particularly pain but tingling and numbness (daily though) Aggravating factors: cold Relieving factors: Gabapentin 3x/day , Voltaren (just got some over the counter)  FALLS: Has patient fallen in last 6 months? Yes. Number of falls 1 - 2-3 months ago - fall bumping L side  LIVING ENVIRONMENT: [facility] Lives in: one level dorm Stairs: No Has following equipment at home: Counselling psychologist, Hemi walker, Wheelchair (manual), and Shower chair (no back)  PLOF: Requires assistive device for independence and Vocation/Vocational requirements: pt is on disability  PATIENT GOALS: improve use of LUE and overall independence with completion of functional activities.  OBJECTIVE:   MRI results:  1. Mild-moderate rotator cuff tendinosis without tear. 2. Severe intra-articular biceps tendinosis with mild tenosynovitis. 3. 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  HAND DOMINANCE: Right  ADLs: Eating: needs assist to cut food Grooming: mod I  UB Dressing: mod I, difficulty w/ buttons LB Dressing: assist to tie shoes Toileting: mod I  Bathing: mod I w/ difficulty getting under Rt arm Tub Shower transfers: mod I  Equipment: Shower seat with back  IADLs: Shopping: mod independent Light housekeeping: mod I  Meal Prep: uses air fryer, [place]es, microwave Community mobility: drives I'ly Medication management: mod I  Handwriting:  denies change  MOBILITY STATUS: Independent; uses quad cane; increased time  ACTIVITY TOLERANCE: Activity tolerance: good  FUNCTIONAL OUTCOME MEASURES: Quick Dash: 63.6 % disability with use of LUE   11/13/2022: 72.7 % disability with use of LUE   UPPER EXTREMITY ROM     AROM Right eval Left eval LEFT 11/13/2022  Shoulder flexion WNL 120* with ataxia 120*  Shoulder abduction WNL 90* with ataxia reports limiting ROM in this direction as his shoulder pops out of socket 110*  Elbow flexion WNL 132* 132*  Elbow extension WNL -36* -25*  Wrist flexion WNL WFL   Wrist extension WNL N/A past neutral 30*  Wrist pronation WNL WFL WNL  Wrist supination WNL 60* with time 60*  (Blank rows = not tested)  L Digit flex and ext: WNL; Digit opposition: BFL, very ataxic  UPPER EXTREMITY MMT:     MMT Right eval Left eval   Shoulder flexion WNL WFL within available ROM  Shoulder abduction WNL "  Elbow flexion WNL WNL  Elbow extension WNL WNL  (Blank rows = not tested)  HAND FUNCTION: Grip strength: Right: 110.6 lbs; Left: 28.4 lbs 10/31/22 Right 113.7, 114.1 lbs Left 31.0, 27.1, 29.5 11/13/2022: 31.9  COORDINATION: Box and Blocks:  Right 47 blocks, Left 8blocks 11/13/2022: Right 47 blocks, Left 8 blocks  SENSATION: Light touch: abscent Proprioception: Impaired   EDEMA: none reported or observed  MUSCLE TONE: LUE: Mild and Hypertonic  COGNITION: Overall cognitive status: Within functional limits for tasks assessed  VISION: Subjective report: No change Baseline vision: Wears glasses for reading only Visual history: corrective eye surgery  VISION ASSESSMENT: Not tested - denies change   OBSERVATIONS: Pt holds LUE in guarded positioning to  L side of body. No volitional use with completion of functional activities.    TODAY'S TREATMENT:                                                                          Therapist reviewed goals with patient and updated patient progression.  No additional functional limitations identified. Objective measures assessed as noted in Goals section to determine progression towards goals.  PATIENT EDUCATION: Education details: OT d/c; Care plan Person educated: Patient Education method: Explanation Education comprehension: verbalized understanding  HOME EXERCISE PROGRAM: 09/20/26 Started HEP with Shoulder Shrugs Access Code: [email or web address] 10/31/22 Progressed HEP for elbow, forearm, wrist and grip Same Access Code  GOALS:  SHORT TERM GOALS: Target date: 10/11/2022    Patient will demonstrate LUE ROM HEP with 25% verbal cues or less for proper execution. Baseline: Does not have handouts Goal status: IN PROGRESS  2.  Pt will demonstrate ability to pick up 9 hole peg and place into board in under 50 seconds Baseline: lacks coordination due to ataxia and limited  opposition Goal status: NOT MET  3.  Pt to verbalize understanding of safety considerations d/t sensory loss LUE Baseline: Not yet initiated Goal status: IN PROGRESS   LONG TERM GOALS: Target date:  11/10/2022  1. Patient will demonstrate updated LUE HEP with 25% verbal cues or less for proper execution.  Baseline: Not yet initiated Goal status: IN Progress  2.  Patient will demonstrate at least 16%  improvement with quick Dash score (reporting 47.6% disability or less) indicating improved functional use of affected extremity.  Baseline: 63.6 % disability with use of LUE Goal status: NOT MET  3.  Will improve Box and Blocks by at least 4 blocks.  Baseline: 8 blocks 10/24/2022: 14 blocks Goal status: MET  4.  Pt will perform home management and stovetop based meal prep with good safety awareness and use of modifications/DME as needed.   Baseline: uses appliances Goal status: NOT MET   ASSESSMENT:  CLINICAL IMPRESSION: *** PERFORMANCE DEFICITS: in functional skills including ADLs, IADLs, coordination, sensation, ROM, strength, pain, Fine motor control, Gross motor control, mobility, balance, and UE functional use.  IMPAIRMENTS: are limiting patient from ADLs, IADLs, and leisure.   CO-MORBIDITIES; may have co-morbidities  that affects occupational performance. Patient will benefit from skilled OT to address above impairments and improve overall function.  REHAB POTENTIAL: Fair secondary to insurance visit limits and limited caregiver assistance/home environment  PLAN:  OT D/C completed   Delana Meyer, OT 11/13/2022, 3:05 PM

## 2022-11-13 NOTE — Patient Instructions (Signed)
  Safety considerations for loss of sensation:   Look at affected hand when using it!   Do NOT use affected arm for anything: sharp, hot, breakable, or too heavy  Always check temperature of water (for showering, washing dishes, etc) with UNaffected arm/extremity  Consider travel mugs w/ lids to transport hot liquids/coffee  Consider alternative options and/or adaptive equipment to make things safer (ex: hand chopper or cut resistant glove for chopping vegetables)   Avoid cold temperatures as well (wear glove in cold temperatures, get ice w/ unaffected extremity)  AVOID handling chemicals and machinery   

## 2022-11-17 ENCOUNTER — Ambulatory Visit
Admission: RE | Admit: 2022-11-17 | Discharge: 2022-11-17 | Disposition: A | Discharge status: Home / Self Care | Payer: Medicaid Other | Source: Ambulatory Visit | Attending: Physical Medicine and Rehabilitation | Admitting: Physical Medicine and Rehabilitation

## 2022-11-17 DIAGNOSIS — M25561 Pain in right knee: Secondary | ICD-10-CM

## 2022-11-22 ENCOUNTER — Other Ambulatory Visit: Payer: Self-pay | Admitting: Adult Health

## 2022-11-24 ENCOUNTER — Encounter: Payer: Self-pay | Admitting: Family

## 2022-11-24 ENCOUNTER — Ambulatory Visit: Payer: Medicare Other | Admitting: Family

## 2022-11-24 VITALS — BP 112/72 | HR 75 | Temp 98.6°F | Ht 70.0 in | Wt 191.6 lb

## 2022-11-24 DIAGNOSIS — Z131 Encounter for screening for diabetes mellitus: Secondary | ICD-10-CM

## 2022-11-24 DIAGNOSIS — I10 Essential (primary) hypertension: Secondary | ICD-10-CM | POA: Diagnosis not present

## 2022-11-24 LAB — POCT GLYCOSYLATED HEMOGLOBIN (HGB A1C): HbA1c, POC (controlled diabetic range): 5 % (ref 0.0–7.0)

## 2022-11-24 MED ORDER — LABETALOL HCL 100 MG PO TABS: 100.0000 mg | ORAL_TABLET | Freq: Two times a day (BID) | ORAL | 0 refills | Status: DC

## 2022-11-24 NOTE — Progress Notes (Signed)
Patient ID: [name], male    DOB: 07/17/1965  MRN: [id number]  CC: Diabetes Screening   Subjective: [name] is a 57 y.o. male who presents for diabetes screening.   His concerns today include:  - Diabetes screening. No issues/concerns for discussion today. - Established with Cardiology.  - Established with Neurology.  - Established with Gastroenterology.  - Established with Urology.   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) 10/24/2022   Chronic diastolic CHF (congestive heart failure) (HCC) 11/07/2021   Left ventricular hypertrophy 11/07/2021   Osteoarthritis of left glenohumeral joint 05/04/2021   Colon cancer screening    Left hemiplegia (HCC) 07/02/2020   Cognitive deficit due to recent stroke 07/02/2020   Dysphagia, post-stroke    Vascular headache    Acute blood loss anemia    AKI (acute kidney injury) (HCC)    Essential hypertension    Partial seizure (HCC)    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 90 tablet 3   atorvastatin (LIPITOR) 80 MG tablet Take 1 tablet (80 mg total) by mouth daily. 90 tablet 1   baclofen (LIORESAL) 10 MG tablet Take 1 tablet (10 mg total) by mouth at bedtime. 90 each 3   folic acid (FOLVITE) 1 MG tablet Take 1 tablet by mouth once daily 30 tablet 3    gabapentin (NEURONTIN) 300 MG capsule Take 1 capsule (300 mg total) by mouth 3 (three) times daily. 270 capsule 3   levETIRAcetam (KEPPRA) 500 MG tablet Take 1 tablet by mouth twice daily 180 tablet 0   losartan (COZAAR) 50 MG tablet Take 1 tablet by mouth twice daily 180 tablet 0   Multiple Vitamins-Minerals (CERTAVITE/ANTIOXIDANTS) TABS Take 1 tablet by mouth daily. 30 tablet 0   tadalafil (CIALIS) 5 MG tablet Take 5 mg by mouth every morning.     tamsulosin (FLOMAX) 0.4 MG CAPS capsule TAKE 1 CAPSULE BY MOUTH ONCE DAILY AFTER SUPPER 30 capsule 0   thiamine 100 MG tablet Take 1 tablet (100 mg total) by mouth daily. 30 tablet 0   topiramate (TOPAMAX) 50 MG tablet Take 1 tablet (50 mg total) by mouth 2 (two) times daily. 60 tablet 5   No current facility-administered medications on file prior to visit.    Allergies  Allergen Reactions   Tamsulosin     Other Reaction(s): Not available    Social History   Socioeconomic History   Marital status: Married    Spouse name: Not on file   Number of children: Not on file   Years of education: Not on file   Highest education level: Not on file  Occupational History   Not on file  Tobacco Use   Smoking status: Former    Types: Cigarettes    Passive exposure: Past   Smokeless tobacco: Never  Vaping Use   Vaping status:  Never Used  Substance and Sexual Activity   Alcohol use: Not Currently   Drug use: Not Currently    Types: Cocaine, Marijuana, Oxycodone   Sexual activity: Yes  Other Topics Concern   Not on file  Social History Narrative   Not on file   Social Determinants of Health   Financial Resource Strain: Not on file  Food Insecurity: Not on file  Transportation Needs: Not on file  Physical Activity: Not on file  Stress: Not on file  Social Connections: Unknown (06/07/2021)   Received from [facility]   Social Network    Social Network: Not on file  Intimate Partner Violence: Unknown (04/28/2021)    [facility] from [facility], [facility]   HITS    Physically Hurt: Not on file    Insult or Talk Down To: Not on file    Threaten Physical Harm: Not on file    Scream or Curse: Not on file    Family History  Problem Relation Age of Onset   Hypertension Mother        Passed away when she is 2 secondary to hip surgery complications   CAD Mother    Heart attack Mother    Hypertension Father    Diabetes Brother    Stroke Brother    Heart attack Maternal Grandmother    Colon cancer Neg Hx    Esophageal cancer Neg Hx    Pancreatic cancer Neg Hx    Stomach cancer Neg Hx    Colon polyps Neg Hx     Past Surgical History:  Procedure Laterality Date   COLONOSCOPY WITH PROPOFOL N/A 12/09/2020   Procedure: COLONOSCOPY WITH PROPOFOL;  Surgeon: Benancio Deeds, MD;  Location: [facility];  Service: Gastroenterology;  Laterality: N/A;   KNEE ARTHROSCOPY Left 04/13/2016   Procedure: ARTHROSCOPY KNEE WITH DEBRIDEMENT;  Surgeon: Valeria Batman, MD;  Location: [facility];  Service: Orthopedics;  Laterality: Left;   KNEE ARTHROSCOPY WITH MEDIAL MENISECTOMY Right 04/13/2016   Procedure: KNEE ARTHROSCOPY WITH MEDIAL MENISECTOMY;  Surgeon: Valeria Batman, MD;  Location: [facility];  Service: Orthopedics;  Laterality: LEFT not right knee   NASAL FRACTURE SURGERY     SHOULDER SURGERY Left     ROS: Review of Systems Negative except as stated above  PHYSICAL EXAM: BP 112/72   Pulse 75   Temp 98.6 F (37 C) (Oral)   Ht 5\' 10"  (1.778 m)   Wt 191 lb 9.6 oz (86.9 kg)   SpO2 96%   BMI 27.49 kg/m   Physical Exam HENT:     Head: Normocephalic and atraumatic.     Nose: Nose normal.     Mouth/Throat:     Mouth: Mucous membranes are moist.     Pharynx: Oropharynx is clear.  Eyes:     Extraocular Movements: Extraocular movements intact.     Conjunctiva/sclera: Conjunctivae normal.     Pupils: Pupils are equal, round, and reactive to light.   Cardiovascular:     Rate and Rhythm: Normal rate and regular rhythm.     Pulses: Normal pulses.     Heart sounds: Normal heart sounds.  Pulmonary:     Effort: Pulmonary effort is normal.     Breath sounds: Normal breath sounds.  Musculoskeletal:        General: Normal range of motion.     Cervical back: Normal range of motion and neck supple.  Neurological:     General:  No focal deficit present.     Mental Status: He is alert and oriented to person, place, and time.  Psychiatric:        Mood and Affect: Mood normal.        Behavior: Behavior normal.     ASSESSMENT AND PLAN: 1. Diabetes mellitus screening - Routine screening.  - POCT glycosylated hemoglobin (Hb A1C); Future  2. Primary hypertension - Continue present management.  - Keep all scheduled appointments with Cardiology.  - labetalol (NORMODYNE) 100 MG tablet; Take 1 tablet (100 mg total) by mouth 2 (two) times daily.  Dispense: 180 tablet; Refill: 0   Patient was given the opportunity to ask questions.  Patient verbalized understanding of the plan and was able to repeat key elements of the plan. Patient was given clear instructions to go to Emergency Department or return to medical center if symptoms don't improve, worsen, or new problems develop.The patient verbalized understanding.   Orders Placed This Encounter  Procedures   POCT glycosylated hemoglobin (Hb A1C)     Requested Prescriptions   Signed Prescriptions Disp Refills   labetalol (NORMODYNE) 100 MG tablet 180 tablet 0    Sig: Take 1 tablet (100 mg total) by mouth 2 (two) times daily.    Follow-up with primary provider as scheduled.   Rema Fendt, NP

## 2022-11-24 NOTE — Progress Notes (Signed)
Patient states they gave him medication to flush his systme out, and his odor came back, wants to go see GI Dr again.   States tingling still happening in his left shoulder. And right knee issues.

## 2022-11-27 ENCOUNTER — Encounter: Payer: Self-pay | Admitting: *Deleted

## 2022-12-04 ENCOUNTER — Encounter: Payer: Self-pay | Admitting: Cardiovascular Disease

## 2022-12-04 NOTE — Progress Notes (Unsigned)
Cardiology Office Note:    Date:  12/05/2022   ID:  [name], DOB March 06, 1965, MRN [id number]  PCP:  Rema Fendt, NP   [facility] Providers Cardiologist:  previously Mayford Knife / Jeffren Dombek      Referring MD: Lavinia Sharps, NP   Chief Complaint  Patient presents with   Hypertension          History of Present Illness:    [name] is a 57 y.o. male with a hx of  HTN.   HLD.  , CHF , hx of stroke ( March , 2021)   No cp or dyspnea ,  walks in the house  Could walk a mile if needed.   Works out  Has had some issues with medication compliance  Left sided weakness from his stroke  Going to restart PT soon   He has been much more compliant with his medications recently.  He says active as he can be considering his stroke.  His diet is improved.   Nov. 12, 2024 [name] is seen for follow up of his stroke, HTN Remains active  Works out at [employer]  Does what he learned in PT / OT  No CP or dyspnea         Past Medical History:  Diagnosis Date   Anxiety    Cataract    removed left eye   Chronic combined systolic and diastolic CHF (congestive heart failure) (HCC)    EF 25-30%   Diastolic dysfunction    Dyslipidemia    Hyperlipidemia    Hypertension    Neuromuscular disorder (HCC)    neuropathy left foot   Nonischemic dilated cardiomyopathy (HCC)    felt secondary to HTN with no ischemia on nuclear stress test 05/2012, EF 25-30%   Seizures (HCC)    last seizure 05-2020- on Keppra   Shortness of breath    Stroke (HCC) 05/2020    Past Surgical History:  Procedure Laterality Date   COLONOSCOPY WITH PROPOFOL N/A 12/09/2020   Procedure: COLONOSCOPY WITH PROPOFOL;  Surgeon: Benancio Deeds, MD;  Location: [facility];  Service: Gastroenterology;  Laterality: N/A;   KNEE ARTHROSCOPY Left 04/13/2016   Procedure: ARTHROSCOPY KNEE WITH DEBRIDEMENT;  Surgeon: Valeria Batman, MD;  Location: [facility];  Service:  Orthopedics;  Laterality: Left;   KNEE ARTHROSCOPY WITH MEDIAL MENISECTOMY Right 04/13/2016   Procedure: KNEE ARTHROSCOPY WITH MEDIAL MENISECTOMY;  Surgeon: Valeria Batman, MD;  Location: [facility];  Service: Orthopedics;  Laterality: LEFT not right knee   NASAL FRACTURE SURGERY     SHOULDER SURGERY Left     Current Medications: Current Meds  Medication Sig   amLODipine (NORVASC) 2.5 MG tablet Take 1 tablet (2.5 mg total) by mouth daily.   atorvastatin (LIPITOR) 80 MG tablet Take 1 tablet (80 mg total) by mouth daily.   folic acid (FOLVITE) 1 MG tablet Take 1 tablet by mouth once daily   gabapentin (NEURONTIN) 300 MG capsule Take 1 capsule (300 mg total) by mouth 3 (three) times daily.   labetalol (NORMODYNE) 100 MG tablet Take 1 tablet (100 mg total) by mouth 2 (two) times daily.   levETIRAcetam (KEPPRA) 500 MG tablet Take 1 tablet by mouth twice daily   losartan (COZAAR) 50 MG tablet Take 1 tablet by mouth twice daily   Multiple Vitamins-Minerals (CERTAVITE/ANTIOXIDANTS) TABS Take 1 tablet by mouth daily.   tadalafil (CIALIS) 5 MG tablet Take 5 mg by  mouth every morning.   tamsulosin (FLOMAX) 0.4 MG CAPS capsule TAKE 1 CAPSULE BY MOUTH ONCE DAILY AFTER SUPPER   thiamine 100 MG tablet Take 1 tablet (100 mg total) by mouth daily.   topiramate (TOPAMAX) 50 MG tablet Take 1 tablet by mouth twice daily     Allergies:   Tamsulosin   Social History   Socioeconomic History   Marital status: Married    Spouse name: Not on file   Number of children: Not on file   Years of education: Not on file   Highest education level: Not on file  Occupational History   Not on file  Tobacco Use   Smoking status: Former    Types: Cigarettes    Passive exposure: Past   Smokeless tobacco: Never  Vaping Use   Vaping status: Never Used  Substance and Sexual Activity   Alcohol use: Not Currently   Drug use: Not Currently    Types: Cocaine, Marijuana, Oxycodone   Sexual activity:  Yes  Other Topics Concern   Not on file  Social History Narrative   Not on file   Social Determinants of Health   Financial Resource Strain: Not on file  Food Insecurity: Not on file  Transportation Needs: Not on file  Physical Activity: Not on file  Stress: Not on file  Social Connections: Unknown (06/07/2021)   Received from [facility]   Social Network    Social Network: Not on file     Family History: The patient's family history includes CAD in his mother; Diabetes in his brother; Heart attack in his maternal grandmother and mother; Hypertension in his father and mother; Stroke in his brother. There is no history of Colon cancer, Esophageal cancer, Pancreatic cancer, Stomach cancer, or Colon polyps.  ROS:   Please see the history of present illness.     All other systems reviewed and are negative.  EKGs/Labs/Other Studies Reviewed:    The following studies were reviewed today:   EKG:    EKG Interpretation Date/Time:  Tuesday December 05 2022 15:20:17 EST Ventricular Rate:  84 PR Interval:  170 QRS Duration:  74 QT Interval:  338 QTC Calculation: 399 R Axis:   10  Text Interpretation: Normal sinus rhythm Nonspecific T wave abnormality When compared with ECG of 09-Jun-2020 19:25, PREVIOUS ECG IS PRESENT Confirmed by Kristeen Miss [phone]) on 12/05/2022 3:36:32 PM     Recent Labs: 11/09/2022: ALT 24  Recent Lipid Panel    Component Value Date/Time   CHOL 134 11/09/2022 0834   TRIG 47 11/09/2022 0834   HDL 62 11/09/2022 0834   CHOLHDL 2.2 11/09/2022 0834   CHOLHDL 3.8 06/09/2020 1809   VLDL 66 (H) 06/09/2020 1809   LDLCALC 61 11/09/2022 0834     Risk Assessment/Calculations:            Physical Exam:     Physical Exam: Blood pressure 134/72, pulse 90, height 5\' 10"  (1.778 m), weight 191 lb 6.4 oz (86.8 kg), SpO2 98%.       GEN:  Well nourished, well developed in no acute distress HEENT: Normal NECK: No JVD; No carotid  bruits LYMPHATICS: No lymphadenopathy CARDIAC: RRR , no murmurs, rubs, gallops RESPIRATORY:  Clear to auscultation without rales, wheezing or rhonchi  ABDOMEN: Soft, non-tender, non-distended MUSCULOSKELETAL:  No edema; No deformity  SKIN: Warm and dry NEUROLOGIC:  Alert and oriented x 3   ASSESSMENT:    1. Nonischemic dilated cardiomyopathy (HCC)   2. Essential  hypertension   3. Cerebrovascular accident (CVA), unspecified mechanism (HCC)     PLAN:       History of congestive heart failure.  His most recent echocardiogram reveals normal left ventricular systolic function.  Echocardiogram from May, 2022 reveals LVEF of 65 to 70%.  He has severe left ventricular hypertrophy and assessment of the diastolic function was not possible.  He has trivial mitral regurgitation Continue current meds  BP is well controlled.    2.  Stroke:   .   3.. HTN:    BP is well controlled.           Medication Adjustments/Labs and Tests Ordered: Current medicines are reviewed at length with the patient today.  Concerns regarding medicines are outlined above.  Orders Placed This Encounter  Procedures   Basic metabolic panel   EKG 12-Lead   No orders of the defined types were placed in this encounter.   Patient Instructions  Lab Work: BMET today If you have labs (blood work) drawn today and your tests are completely normal, you will receive your results only by: MyChart Message (if you have MyChart) OR A paper copy in the mail If you have any lab test that is abnormal or we need to change your treatment, we will call you to review the results.  Follow-Up: At [facility], you and your health needs are our priority.  As part of our continuing mission to provide you with exceptional heart care, we have created designated Provider Care Teams.  These Care Teams include your primary Cardiologist (physician) and Advanced Practice Providers (APPs -  Physician Assistants and Nurse  Practitioners) who all work together to provide you with the care you need, when you need it.  We recommend signing up for the patient portal called "MyChart".  Sign up information is provided on this After Visit Summary.  MyChart is used to connect with patients for Virtual Visits (Telemedicine).  Patients are able to view lab/test results, encounter notes, upcoming appointments, etc.  Non-urgent messages can be sent to your provider as well.   To learn more about what you can do with MyChart, go to [email or web address].    Your next appointment:   1 year(s)  Provider:   Mackey Birchwood, MD     Signed, Kristeen Miss, MD  12/05/2022 3:36 PM    [facility]

## 2022-12-05 ENCOUNTER — Telehealth: Payer: Self-pay | Admitting: Physical Medicine and Rehabilitation

## 2022-12-05 ENCOUNTER — Ambulatory Visit: Payer: Medicare Other | Attending: Cardiovascular Disease | Admitting: Cardiovascular Disease

## 2022-12-05 ENCOUNTER — Encounter: Payer: Self-pay | Admitting: Cardiovascular Disease

## 2022-12-05 VITALS — BP 134/72 | HR 90 | Ht 70.0 in | Wt 191.4 lb

## 2022-12-05 DIAGNOSIS — I639 Cerebral infarction, unspecified: Secondary | ICD-10-CM | POA: Diagnosis present

## 2022-12-05 DIAGNOSIS — I420 Dilated cardiomyopathy: Secondary | ICD-10-CM | POA: Insufficient documentation

## 2022-12-05 DIAGNOSIS — I10 Essential (primary) hypertension: Secondary | ICD-10-CM | POA: Diagnosis present

## 2022-12-05 NOTE — Telephone Encounter (Signed)
Patient came to office asking about results of X-Ray on right knee. He is wanting to know if he needs an MRI.

## 2022-12-05 NOTE — Patient Instructions (Signed)
Lab Work: BMET today If you have labs (blood work) drawn today and your tests are completely normal, you will receive your results only by: [employer] (if you have MyChart) OR A paper copy in the mail If you have any lab test that is abnormal or we need to change your treatment, we will call you to review the results.  Follow-Up: At [facility], you and your health needs are our priority.  As part of our continuing mission to provide you with exceptional heart care, we have created designated Provider Care Teams.  These Care Teams include your primary Cardiologist (physician) and Advanced Practice Providers (APPs -  Physician Assistants and Nurse Practitioners) who all work together to provide you with the care you need, when you need it.  We recommend signing up for the patient portal called "MyChart".  Sign up information is provided on this After Visit Summary.  MyChart is used to connect with patients for Virtual Visits (Telemedicine).  Patients are able to view lab/test results, encounter notes, upcoming appointments, etc.  Non-urgent messages can be sent to your provider as well.   To learn more about what you can do with MyChart, go to [email or web address].    Your next appointment:   1 year(s)  Provider:   Mackey Birchwood, MD

## 2022-12-06 ENCOUNTER — Ambulatory Visit (INDEPENDENT_AMBULATORY_CARE_PROVIDER_SITE_OTHER): Payer: Medicare Other | Admitting: Neurology

## 2022-12-06 ENCOUNTER — Ambulatory Visit (INDEPENDENT_AMBULATORY_CARE_PROVIDER_SITE_OTHER): Payer: Self-pay | Admitting: Neurology

## 2022-12-06 ENCOUNTER — Telehealth: Payer: Self-pay

## 2022-12-06 DIAGNOSIS — I629 Nontraumatic intracranial hemorrhage, unspecified: Secondary | ICD-10-CM | POA: Diagnosis not present

## 2022-12-06 DIAGNOSIS — I69954 Hemiplegia and hemiparesis following unspecified cerebrovascular disease affecting left non-dominant side: Secondary | ICD-10-CM

## 2022-12-06 DIAGNOSIS — R569 Unspecified convulsions: Secondary | ICD-10-CM

## 2022-12-06 DIAGNOSIS — Z0289 Encounter for other administrative examinations: Secondary | ICD-10-CM

## 2022-12-06 DIAGNOSIS — G5603 Carpal tunnel syndrome, bilateral upper limbs: Secondary | ICD-10-CM

## 2022-12-06 DIAGNOSIS — G5600 Carpal tunnel syndrome, unspecified upper limb: Secondary | ICD-10-CM

## 2022-12-06 DIAGNOSIS — I69391 Dysphagia following cerebral infarction: Secondary | ICD-10-CM

## 2022-12-06 MED ORDER — TOPIRAMATE 100 MG PO TABS: 100.0000 mg | ORAL_TABLET | Freq: Two times a day (BID) | ORAL | 3 refills | Status: DC

## 2022-12-06 NOTE — Telephone Encounter (Signed)
Xeomin [id number]  [id number] ELECTRICAL STIMULATION  500 UNITS  CHEMICAL DENERVATION OF LIMBS/TRUNK MUSCLES 64642 1-4 LIMB MUSCLES 64643 ADDITIONAL 1-4 LIMB MUSCLES 64644 5 OR  LIMB MUSCLES 64645 EACH ADDITIONAL 5 OR MORE (LIMB MUSCLES)   HEMIPARESIS AFFECTING LEFT SIDE AS LATE EFFECT OF CVA G64.403

## 2022-12-06 NOTE — Telephone Encounter (Signed)
Pt's primary Medicare will not require auth, completed PA form for pt's secondary [employer] and placed in nurse pod for MD signature.

## 2022-12-06 NOTE — Progress Notes (Signed)
Chief Complaint  Patient presents with   Procedure    Rm EMG/NCV 4.      ASSESSMENT AND PLAN  [name] is a 57 y.o. 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, Complex partial seizure  Was able to taper off Keppra, on higher dose of Topamax 100 mg twice a day Spastic left hemiparesis  Prior authorization for xeomin 500 units, return to clinic in 1 months for  injection Chronic migraine headache  Doing very well with higher dose of Topamax 100 mg twice a day, Worsening dysarthria, left neck pain radiating pain to left shoulder left arm weakness since he fell landed on his left elbow in June 2024  MRI of the brain, cervical spine   EMG nerve conduction study showed mild bilateral carpal tunnel syndromes, there is no other left upper extremity focal neuropathy  DIAGNOSTIC DATA (LABS, IMAGING, TESTING) - I reviewed patient records, labs, notes, testing and imaging myself where available.   MEDICAL HISTORY:  [name], is a 57 year old male, seen in request by his primary care nurse practitioner Zonia Kief, Amy J, for evaluation of worsening dysarthria, left arm function,  History is obtained from the patient and review of electronic medical records. I personally reviewed pertinent available imaging films in PACS.   PMHx of  Right thalamic bleeding in March 2022 HTN HLD CHF Seizure Used to drink beer 40 oz daily  He had a history of right thalamic intracranial bleeding with intraventricular breakthrough, symptomatic seizure in March 2022, with residual spastic left arm and leg weakness, neuropathic pain, significant left shoulder pain  MRI of left shoulder without contrast in March 2023 showed mild to moderate rotator cuff tendinosis without tear, severe intra-articular biceps tendinosis with mild tenosynovitis, mild glenohumeral and mild to moderate AC joint osteoarthritis  MRI of the brain in March 2023 chronic right thalamic  hemorrhage with encephalomalacia hemosiderin deposition, moderate small vessel disease involving periventricular white matter, pons,  Acute phase MRI with without contrast in May 2022 showed acute right thalamic hemorrhage measuring up to 3.8 x 2.4 cm, intraventricular extension MRA of brain showed no vascular malformation,  He was able to ambulate, with some recovering of left upper extremity function, he fell in June 2024, backwards landed with his left elbow on the concrete floor, had severe left elbow pain, numbness tingling from left elbow down since then, seems to have worsening left hand function, harder for him to spread and extend his fingers, now he wear left wrist splint,  X-ray of left elbow was somewhat limited by his slight flexed position, the  radiocapitellar and ulnohumeral joints are well located.  There is notable  ossification at the posterior aspect of the olecranon at the triceps  insertion.   UPDATE Dec 06 2022: H return for electrodiagnostic study today, which showed mild bilateral median neuropathy across the wrist, consistent with mild bilateral carpal tunnel syndromes, there was no other left upper extremity focal neuropathy,  He also noticed worsening neck pain radiating pain to left shoulder, and extending to left dorsum forearm since he fell landed on his left elbow in June 2024,  He has significant tightness of left pectoralis major latissimus dorsi, and the brachialis, would benefit botulism toxin injection for left upper and lower extremity spasticity PHYSICAL EXAM:   Vitals:   12/06/22 1036  BP: 135/75  Pulse: 74  Weight: 195 lb (88.5 kg)  Height: 5\' 10"  (1.778 m)     Body mass  index is 27.98 kg/m.  PHYSICAL EXAMNIATION:  Gen: NAD, conversant, well nourised, well groomed                     Cardiovascular: Regular rate rhythm, no peripheral edema, warm, nontender. Eyes: Conjunctivae clear without exudates or hemorrhage Neck: Supple, no carotid  bruits. Pulmonary: Clear to auscultation bilaterally   NEUROLOGICAL EXAM:  MENTAL STATUS: Speech/cognition: Awake, alert, oriented to history taking and casual conversation, no dysarthria CRANIAL NERVES: CN II: Visual fields are full to confrontation. Pupils are round equal and briskly reactive to light. CN [name], IV, VI: extraocular movement are normal. No ptosis. CN V: Facial sensation is intact to light touch CN VII: Mild left lower face weakness CN VIII: Hearing is normal to causal conversation. CN IX, X: Phonation is normal. CN XI: Head turning and shoulder shrug are intact  MOTOR: Spastic left hemiparesis, limited range of motion of left shoulder, mild left shoulder abduction, external rotation weakness, tendency for left arm pronation, wrist flexion, left elbow extension 5, flexion 4, wrist extension 3, flexion 4, finger abduction 3, mild tenderness of left pectoralis major, latissimus dorsi with passive stretch Moderate spasticity of left lower extremity, profound left hip flexion weakness, mild left ankle dorsiflexion weakness  REFLEXES: Hyperreflexia of left upper and lower extremity SENSORY: Decreased light touch on the right side  COORDINATION: There is no trunk or limb dysmetria noted.  GAIT/STANCE: Push-up to get up from seated position, holding left elbow flexion pronation, wrist flexion, high step, spastic left leg, difficulty clear from the floor  REVIEW OF SYSTEMS:  Full 14 system review of systems performed and notable only for as above All other review of systems were negative.   ALLERGIES: Allergies  Allergen Reactions   Tamsulosin     Other Reaction(s): Not available    HOME MEDICATIONS: Current Outpatient Medications  Medication Sig Dispense Refill   amLODipine (NORVASC) 2.5 MG tablet Take 1 tablet (2.5 mg total) by mouth daily. 90 tablet 3   atorvastatin (LIPITOR) 80 MG tablet Take 1 tablet (80 mg total) by mouth daily. 90 tablet 1   baclofen  (LIORESAL) 10 MG tablet Take 1 tablet (10 mg total) by mouth at bedtime. 90 each 3   folic acid (FOLVITE) 1 MG tablet Take 1 tablet by mouth once daily 30 tablet 3   gabapentin (NEURONTIN) 300 MG capsule Take 1 capsule (300 mg total) by mouth 3 (three) times daily. 270 capsule 3   labetalol (NORMODYNE) 100 MG tablet Take 1 tablet (100 mg total) by mouth 2 (two) times daily. 180 tablet 0   levETIRAcetam (KEPPRA) 500 MG tablet Take 1 tablet by mouth twice daily 180 tablet 0   losartan (COZAAR) 50 MG tablet Take 1 tablet by mouth twice daily 180 tablet 0   Multiple Vitamins-Minerals (CERTAVITE/ANTIOXIDANTS) TABS Take 1 tablet by mouth daily. 30 tablet 0   tadalafil (CIALIS) 5 MG tablet Take 5 mg by mouth every morning.     tamsulosin (FLOMAX) 0.4 MG CAPS capsule TAKE 1 CAPSULE BY MOUTH ONCE DAILY AFTER SUPPER 30 capsule 0   thiamine 100 MG tablet Take 1 tablet (100 mg total) by mouth daily. 30 tablet 0   topiramate (TOPAMAX) 50 MG tablet Take 1 tablet by mouth twice daily 60 tablet 0   No current facility-administered medications for this visit.    PAST MEDICAL HISTORY: Past Medical History:  Diagnosis Date   Anxiety    Cataract    removed left eye  Chronic combined systolic and diastolic CHF (congestive heart failure) (HCC)    EF 25-30%   Diastolic dysfunction    Dyslipidemia    Hyperlipidemia    Hypertension    Neuromuscular disorder (HCC)    neuropathy left foot   Nonischemic dilated cardiomyopathy (HCC)    felt secondary to HTN with no ischemia on nuclear stress test 05/2012, EF 25-30%   Seizures (HCC)    last seizure 05-2020- on Keppra   Shortness of breath    Stroke (HCC) 05/2020    PAST SURGICAL HISTORY: Past Surgical History:  Procedure Laterality Date   COLONOSCOPY WITH PROPOFOL N/A 12/09/2020   Procedure: COLONOSCOPY WITH PROPOFOL;  Surgeon: Benancio Deeds, MD;  Location: [facility];  Service: Gastroenterology;  Laterality: N/A;   KNEE ARTHROSCOPY Left  04/13/2016   Procedure: ARTHROSCOPY KNEE WITH DEBRIDEMENT;  Surgeon: Valeria Batman, MD;  Location: [facility];  Service: Orthopedics;  Laterality: Left;   KNEE ARTHROSCOPY WITH MEDIAL MENISECTOMY Right 04/13/2016   Procedure: KNEE ARTHROSCOPY WITH MEDIAL MENISECTOMY;  Surgeon: Valeria Batman, MD;  Location: [facility];  Service: Orthopedics;  Laterality: LEFT not right knee   NASAL FRACTURE SURGERY     SHOULDER SURGERY Left     FAMILY HISTORY: Family History  Problem Relation Age of Onset   Hypertension Mother        Passed away when she is 71 secondary to hip surgery complications   CAD Mother    Heart attack Mother    Hypertension Father    Diabetes Brother    Stroke Brother    Heart attack Maternal Grandmother    Colon cancer Neg Hx    Esophageal cancer Neg Hx    Pancreatic cancer Neg Hx    Stomach cancer Neg Hx    Colon polyps Neg Hx     SOCIAL HISTORY: Social History   Socioeconomic History   Marital status: Married    Spouse name: Not on file   Number of children: Not on file   Years of education: Not on file   Highest education level: Not on file  Occupational History   Not on file  Tobacco Use   Smoking status: Former    Types: Cigarettes    Passive exposure: Past   Smokeless tobacco: Never  Vaping Use   Vaping status: Never Used  Substance and Sexual Activity   Alcohol use: Not Currently   Drug use: Not Currently    Types: Cocaine, Marijuana, Oxycodone   Sexual activity: Yes  Other Topics Concern   Not on file  Social History Narrative   Not on file   Social Determinants of Health   Financial Resource Strain: Not on file  Food Insecurity: Not on file  Transportation Needs: Not on file  Physical Activity: Not on file  Stress: Not on file  Social Connections: Unknown (06/07/2021)   Received from [facility]   Social Network    Social Network: Not on file  Intimate Partner Violence: Unknown  (04/28/2021)   Received from [facility], [facility]   HITS    Physically Hurt: Not on file    Insult or Talk Down To: Not on file    Threaten Physical Harm: Not on file    Scream or Curse: Not on file      Levert Feinstein, M.D. Ph.D.  [facility] [address], [address] [place], [place] [address] Ph: [phone] Fax: [phone]  CC:  Zonia Kief,  Salomon Fick, NP [address] [place],  [place] [address]  Rema Fendt, NP

## 2022-12-06 NOTE — Procedures (Signed)
Full Name: [name] Gender: Male MRN #: [id number] Date of Birth: 04/21/1965    Visit Date: 12/06/2022 10:31 Age: 57 Years Examining Physician: Dr. Levert Feinstein Referring Physician: Dr. Levert Feinstein Height: 5 feet 10 inch History: 57 year old right-handed male, with history of right thalamic hemorrhagic stroke, with residual spastic left hemiparesis, fell landed on his spastic left upper extremity in June 2024, seems to have increased the left arm difficulty numbness since then  Summary of the test: Nerve conduction study: Bilateral median, ulnar, radial to EIP motor responses were well-preserved within normal range.  Bilateral ulnar and sensory responses were normal.  Bilateral median sensory responses showed mildly prolonged peak latency, with normal snap amplitude.  Electromyography: Selected needle examinations were performed at left upper extremity muscles and left cervical paraspinal muscles.  There was no significant abnormality noted, other than mildly decreased activation at clinically weak muscle group   Conclusion: This is a slight abnormal  study.  There is evidence of mild bilateral carpal tunnel syndromes.  There is no evidence of left upper extremity focal neuropathy otherwise.  There is no evidence of left cervical radiculopathy.    ------------------------------- Levert Feinstein, M.D. PhD  [facility] [address], [address] [place], [place] [address] Tel: [phone] Fax: [phone]  Verbal informed consent was obtained from the patient, patient was informed of potential risk of procedure, including bruising, bleeding, hematoma formation, infection, muscle weakness, muscle pain, numbness, among others.        MNC    Nerve / Sites Muscle Latency Ref. Amplitude Ref. Rel Amp Segments Distance Velocity Ref. Area    ms ms mV mV %  cm m/s m/s mVms  L Median - APB     Wrist APB 4.0 <=4.4 4.2 >=4.0 100 Wrist - APB 7   12.4     Upper arm  APB 9.2  1.5  34.6 Upper arm - Wrist 29 56 >=49 4.1  R Median - APB     Wrist APB 4.3 <=4.4 5.5 >=4.0 100 Wrist - APB 7   12.0     Upper arm APB 9.8  4.4  81 Upper arm - Wrist 28 51 >=49 11.1  L Ulnar - ADM     Wrist ADM 2.5 <=3.3 11.4 >=6.0 100 Wrist - ADM 7   30.8     B.Elbow ADM 5.1  9.4  82.6 B.Elbow - Wrist 15 58 >=49 23.7     A.Elbow ADM 8.6  5.6  59.8 A.Elbow - B.Elbow 19 54 >=49 17.5  R Ulnar - ADM     Wrist ADM 2.8 <=3.3 8.4 >=6.0 100 Wrist - ADM 7   25.6     B.Elbow ADM 5.3  5.4  63.7 B.Elbow - Wrist 12.6 51 >=49 17.2     A.Elbow ADM 8.7  6.8  127 A.Elbow - B.Elbow 19 55 >=49 23.4  R Radial - EIP     Forearm EIP 2.3 <=2.9 8.6 >=2.0 100 Forearm - EIP 4  >=49 30.4     Elbow EIP 3.8  6.9  80.8 Elbow - Forearm 19 130  30.2     Spiral Gr EIP 5.4  8.0  115 Spiral Gr - Elbow 12 74  31.3  L Radial - EIP     Forearm EIP 2.9 <=2.9 8.7 >=2.0 100 Forearm - EIP 4  >=49 41.6     Elbow EIP 3.6  6.8  78.5 Elbow - Forearm 11 170  35.2     Spiral Gr EIP 5.6  7.7  113 Spiral Gr - Elbow 12 61  42.2                 SNC    Nerve / Sites Rec. Site Peak Lat Ref.  Amp Ref. Segments Distance    ms ms V V  cm  R Radial - Anatomical snuff box (Forearm)     Forearm Wrist 2.3 <=2.9 58 >=15 Forearm - Wrist 10  L Radial - Anatomical snuff box (Forearm)     Forearm Wrist 2.2 <=2.9 35 >=15 Forearm - Wrist 10  R Median - Orthodromic (Dig II, Mid palm)     Dig II Wrist 3.6 <=3.4 6 >=10 Dig II - Wrist 13  L Median - Orthodromic (Dig II, Mid palm)     Dig II Wrist 3.8 <=3.4 13 >=10 Dig II - Wrist 13  R Ulnar - Orthodromic, (Dig V, Mid palm)     Dig V Wrist 2.9 <=3.1 6 >=5 Dig V - Wrist 11  L Ulnar - Orthodromic, (Dig V, Mid palm)     Dig V Wrist 3.0 <=3.1 14 >=5 Dig V - Wrist 25                 F  Wave    Nerve F Lat Ref.   ms ms  L Ulnar - ADM 26.9 <=32.0  R Ulnar - ADM 29.3 <=32.0         EMG Summary Table    Spontaneous MUAP Recruitment  Muscle IA Fib PSW Fasc Other Amp Dur. Poly Pattern  L.  First dorsal interosseous Normal None None None _______ Normal Normal Normal Normal  L. Abductor pollicis brevis Normal None None None _______ Normal Normal Normal Normal  L. Pronator teres Normal None None None _______ Normal Normal Normal Normal  L. Biceps brachii Normal None None None _______ Normal Normal Normal Normal  L. Deltoid Normal None None None _______ Normal Normal Normal Normal  L. Triceps brachii Normal None None None _______ Normal Normal Normal Normal  L. Brachioradialis Normal None None None _______ Normal Normal Normal Normal  L. Flexor digitorum profundus (Ulnar) Normal None None None _______ Normal Normal Normal Normal  L. Flexor digitorum profundus (Median) Normal None None None _______ Normal Normal Normal Normal  L. Cervical paraspinals Normal None None None _______ Normal Normal Normal Normal

## 2022-12-07 ENCOUNTER — Telehealth: Payer: Self-pay | Admitting: Neurology

## 2022-12-07 NOTE — Telephone Encounter (Signed)
[employer] [place]: [id number] exp. 12/07/22-02/04/23 sent to GI [phone]

## 2022-12-11 NOTE — Telephone Encounter (Signed)
Faxed signed PA form with OV notes to [facility] @ [phone].

## 2022-12-12 ENCOUNTER — Encounter
Payer: Medicare Other | Attending: Physical Medicine and Rehabilitation | Admitting: Physical Medicine and Rehabilitation

## 2022-12-12 DIAGNOSIS — G5600 Carpal tunnel syndrome, unspecified upper limb: Secondary | ICD-10-CM | POA: Insufficient documentation

## 2022-12-12 DIAGNOSIS — M25561 Pain in right knee: Secondary | ICD-10-CM | POA: Insufficient documentation

## 2022-12-12 NOTE — Progress Notes (Signed)
EMG report is under procedure tab. 

## 2022-12-13 NOTE — Progress Notes (Signed)
Called patient to discuss XR results but there was no response

## 2022-12-15 ENCOUNTER — Other Ambulatory Visit: Payer: Medicaid Other

## 2022-12-18 ENCOUNTER — Encounter: Payer: Self-pay | Admitting: Adult Health

## 2022-12-18 ENCOUNTER — Ambulatory Visit: Payer: Medicaid Other | Admitting: Adult Health

## 2022-12-18 NOTE — Progress Notes (Deleted)
[facility] [address] [place]. [name] [phone]       STROKE FOLLOW UP NOTE  [name] Date of Birth:  04/26/1965 Medical Record Number:  [id number]    Primary neurologist: Dr. Pearlean Brownie Reason for Referral: stroke, seizure and headache follow up    SUBJECTIVE:   CHIEF COMPLAINT:  No chief complaint on file.   HPI:   Update 12/18/2022 JM: Patient returns for stroke and seizure follow-up visit after prior visit with myself over 1 year ago. He was seen by Dr. Terrace Arabia on 10/1 with complaints of worsening left arm functioning after a fall in June landing on left elbow and worsening dysarthria and worsening headaches.  Increased topiramate dosage and recommended EMG/NCV which showed mild bilateral CTS otherwise unremarkable. MR brain, MRA head and c-spine recommended but not yet completed. Plans on proceeding with Xeomin for spasticity.  Reports symptoms have been stable since prior visit without worsening.  Continues to follow with PMR, remains on gabapentin and baclofen for post stroke pain.  Headaches well-controlled on topiramate 100 mg twice daily.    Denies new stroke/TIA symptoms.  Compliant on atorvastatin 80 mg daily.  Routinely follows with PCP for stroke risk factor management.  Denies any seizure activity, remains on Keppra, tolerating without side effects         History provided for reference purposes only Update 10/31/2021 JM: Patient returns for follow-up after prior visit 7 months ago regarding history of ICH, seizures and headaches.  He is unaccompanied.  Reports residual left sided weakness and nerve pain. Was previously just in foot, but now going up his whole leg to his hip.  At prior visit with PMR, recommended trialing different medications but he wishes to try to avoid medications if able.  He questions restarting therapies.  Continues to ambulate with a cane, denies any recent falls.  Does mention left side of face and  tongue feels swollen over the past 2 weeks, denies any change in diet or medications.  Denies any difficulty breathing or swallowing. Compliant on Keppra, no seizure activity. Has not had any recent headaches, currently on topiramate 50 mg twice daily. On atorvastatin, denies side effects.  Blood pressure well controlled, today 112/65.  Is being seen by cardiology next week for further blood pressure management.  No further concerns at this time.   Update 03/23/2021 JM: 57 year old male with history of right thalamic ICH with IVH and symptomatic seizures.  Returns today for follow-up visit unaccompanied.  Overall stable from stroke standpoint without new stroke/TIA symptoms.  Reports residual left-sided weakness with left arm and foot neuropathic pain and intermittent blurred vision in left periphery bilaterally although improving. Seen by PMR 2 days ago - continues to manage pain. He frustrated by continued pain and is looking at different home options to help with pain such as tens units. Left Shoulder xray showed subluxation and prominent acromioclavicular and glenohumeral degenerative change.  Continues working with PT/OT. Awaiting AFO brace, recently ordered by PMR. Ambulates with quad cane, no recent falls. Compliant on Keppra, denies side effects, no seizure activity noted.  Compliant on topiramate 50mg  qhs for headaches that were present during hospitalization, denies side effects, does report some worsening headaches over the past 2 weeks, reports 2 headaches left frontal area associated with blurred vision in left eye and imbalance (leaning backwards), typically will resolve shortly after taking Tylenol, denies photophobia, phonophobia, or N/V. Describes as dull pain, denies pulsating, pressure or stabbing sensation.  He  is routinely followed by Dr. Hyacinth Meeker ophthalmologist but has not had evaluation since worsening headaches with visual issues. Compliant on atorvastatin without side effects.  Blood pressure  today 136/75.  No further concerns at this time.  Initial visit 09/01/2020 JM: [name] is being seen for hospital follow-up accompanied by his wife, [name].  Reports residual left-sided weakness, left shoulder pain and left foot pain, right side back and hip pain, and occasional blurred vision in left periphery. Currently working with [facility] PT/OT/SLP.  He ambulates with hemiwalker during therapy sessions otherwise transfers via wheelchair. Seen by PMR (8/2) via telemed visit and currently on Tylenol, Norco, tizanidine, lidocaine patch and gabapentin. Has f/u visit 8/26 with OV note mentioning trial of Botox.  Continues to experience left foot numbness and pain worse at night -gabapentin helps some.  Denies new stroke/TIA symptoms.  Remains on Keppra 500 mg twice daily tolerating without seizure activity.  Remains on topiramate for headache prophylaxis.  Compliant on atorvastatin 40 mg daily -denies side effects.  Blood pressure today 142/81 -routinely monitors at home and has been stable. Per wife, plans on being seen by urology for voiding difficulties post stroke and current use of tamsulosin.  No further concerns at this time.  Stroke admission 06/09/2020 [name] is a 57 y.o. male with history of hypertension, obesity, cardiomyopathy, hyperlipidemia, CHF admitted on 06/09/2020 for right-sided gaze, right-sided weakness numbness and fall.  Personally reviewed hospitalization pertinent progress notes, lab work and imaging.  Evaluated by Dr. Roda Shutters for right thalamic ICH with IVH with midline shift and edema likely due to uncontrolled HTN.  BP treated with Cleviprex with adjustments made to home regimen. MRA no vascular malformations. CUS unremarkable.  EF 65 to 70%.  UDS negative.  LDL 78 on atorvastatin 40 mg daily.  A1c 5.5.  Other stroke risk factors include EtOH use, obesity and suspected sleep apnea.  Stroke team signed off on 5/24 but reevaluated on 5/27 due to altered mental status unresponsive to  verbal and tactile stimuli, neck jerking to the left and left gaze deviation likely partial complex seizure.  Patient on Depakote for headache prophylaxis with Depakote level 45. CT head stable ICH and IVH with mild hydrocephalus.  EEG and LTM EEG no evidence of seizures.  Placed on Keppra 500 mg twice daily for seizure prophylaxis.  No additional seizures.  Hospital course complicated by sundowning placed on Seroquel, AKI and headaches.  Therapies recommended CIR for residual dense left hemiplegia, right gaze preference, dysphagia, fatigue and visual deficits. CIR admission 5/31-6/29 -significant for left shoulder pain due to mild subluxation, right-sided low back and hip pain and headaches.  PERTINENT IMAGING/LABS  Per hospitalization 06/09/2020 CT head showed right thalamic ICH with IVH with 4 mm midline shift Repeat CT head no hematoma expansion MRI stable right thalamic ICH with IVH and midline shift MRA no aneurysm or AVM CT repeat x 2 stable right thalamic hemorrhage with mild intraventricular extension. 2D Echo EF 65-70% CUS unremarkable LDL 78 HgbA1c 5.5      ROS:   14 system review of systems performed and negative with exception of those listed in HPI  PMH:  Past Medical History:  Diagnosis Date   Anxiety    Cataract    removed left eye   Chronic combined systolic and diastolic CHF (congestive heart failure) (HCC)    EF 25-30%   Diastolic dysfunction    Dyslipidemia    Hyperlipidemia    Hypertension    Neuromuscular disorder (  HCC)    neuropathy left foot   Nonischemic dilated cardiomyopathy (HCC)    felt secondary to HTN with no ischemia on nuclear stress test 05/2012, EF 25-30%   Seizures (HCC)    last seizure 05-2020- on Keppra   Shortness of breath    Stroke (HCC) 05/2020    PSH:  Past Surgical History:  Procedure Laterality Date   COLONOSCOPY WITH PROPOFOL N/A 12/09/2020   Procedure: COLONOSCOPY WITH PROPOFOL;  Surgeon: Benancio Deeds, MD;  Location:  [facility];  Service: Gastroenterology;  Laterality: N/A;   KNEE ARTHROSCOPY Left 04/13/2016   Procedure: ARTHROSCOPY KNEE WITH DEBRIDEMENT;  Surgeon: Valeria Batman, MD;  Location: [facility];  Service: Orthopedics;  Laterality: Left;   KNEE ARTHROSCOPY WITH MEDIAL MENISECTOMY Right 04/13/2016   Procedure: KNEE ARTHROSCOPY WITH MEDIAL MENISECTOMY;  Surgeon: Valeria Batman, MD;  Location: [facility];  Service: Orthopedics;  Laterality: LEFT not right knee   NASAL FRACTURE SURGERY     SHOULDER SURGERY Left     Social History:  Social History   Socioeconomic History   Marital status: Married    Spouse name: Not on file   Number of children: Not on file   Years of education: Not on file   Highest education level: Not on file  Occupational History   Not on file  Tobacco Use   Smoking status: Former    Types: Cigarettes    Passive exposure: Past   Smokeless tobacco: Never  Vaping Use   Vaping status: Never Used  Substance and Sexual Activity   Alcohol use: Not Currently   Drug use: Not Currently    Types: Cocaine, Marijuana, Oxycodone   Sexual activity: Yes  Other Topics Concern   Not on file  Social History Narrative   Not on file   Social Determinants of Health   Financial Resource Strain: Not on file  Food Insecurity: Not on file  Transportation Needs: Not on file  Physical Activity: Not on file  Stress: Not on file  Social Connections: Unknown (06/07/2021)   [facility] from [facility], [facility]   Social Network    Social Network: Not on file  Intimate Partner Violence: Unknown (04/28/2021)   Received from [employer], [facility]   HITS    Physically Hurt: Not on file    Insult or Talk Down To: Not on file    Threaten Physical Harm: Not on file    Scream or Curse: Not on file    Family History:  Family History  Problem Relation Age of Onset   Hypertension Mother        Passed away when she is 89 secondary to hip  surgery complications   CAD Mother    Heart attack Mother    Hypertension Father    Diabetes Brother    Stroke Brother    Heart attack Maternal Grandmother    Colon cancer Neg Hx    Esophageal cancer Neg Hx    Pancreatic cancer Neg Hx    Stomach cancer Neg Hx    Colon polyps Neg Hx     Medications:   Current Outpatient Medications on File Prior to Visit  Medication Sig Dispense Refill   amLODipine (NORVASC) 2.5 MG tablet Take 1 tablet (2.5 mg total) by mouth daily. 90 tablet 3   atorvastatin (LIPITOR) 80 MG tablet Take 1 tablet (80 mg total) by mouth daily. 90 tablet 1   baclofen (LIORESAL) 10 MG tablet Take  1 tablet (10 mg total) by mouth at bedtime. 90 each 3   folic acid (FOLVITE) 1 MG tablet Take 1 tablet by mouth once daily 30 tablet 3   gabapentin (NEURONTIN) 300 MG capsule Take 1 capsule (300 mg total) by mouth 3 (three) times daily. 270 capsule 3   labetalol (NORMODYNE) 100 MG tablet Take 1 tablet (100 mg total) by mouth 2 (two) times daily. 180 tablet 0   losartan (COZAAR) 50 MG tablet Take 1 tablet by mouth twice daily 180 tablet 0   Multiple Vitamins-Minerals (CERTAVITE/ANTIOXIDANTS) TABS Take 1 tablet by mouth daily. 30 tablet 0   tadalafil (CIALIS) 5 MG tablet Take 5 mg by mouth every morning.     tamsulosin (FLOMAX) 0.4 MG CAPS capsule TAKE 1 CAPSULE BY MOUTH ONCE DAILY AFTER SUPPER 30 capsule 0   thiamine 100 MG tablet Take 1 tablet (100 mg total) by mouth daily. 30 tablet 0   topiramate (TOPAMAX) 100 MG tablet Take 1 tablet (100 mg total) by mouth 2 (two) times daily. 180 tablet 3   No current facility-administered medications on file prior to visit.    Allergies:   Allergies  Allergen Reactions   Tamsulosin     Other Reaction(s): Not available      OBJECTIVE:  Physical Exam  There were no vitals filed for this visit.  There is no height or weight on file to calculate BMI. No results found.  General: well developed, well nourished, very pleasant  middle-aged African-American male, seated, in no evident distress Head: head normocephalic and atraumatic.   Neck: supple with no carotid or supraclavicular bruits Cardiovascular: regular rate and rhythm, no murmurs Musculoskeletal: no deformity; pitting edema L>R chronic  Skin:  no rash/petichiae Vascular:  Normal pulses all extremities   Neurologic Exam Mental Status: Awake and fully alert.  Fluent speech and language.  Oriented to place and time. Recent memory mildly impaired and remote memory intact. Attention span, concentration and fund of knowledge appropriate during visit. Mood and affect appropriate.  Cranial Nerves: Pupils equal, briskly reactive to light. Extraocular movements full without nystagmus. Visual fields full to confrontation. Hearing intact.  Mild left lower facial weakness.  Tongue, palate moves normally and symmetrically.  Motor: Normal strength, bulk and tone right upper and lower extremity LUE: 4-/5 deltoid and handgrip, 4/5elbow flexion and extension, increased tone throughout LLE: 3/5 hip flexor, 4+/5 KF and KE, 4/5 ADF and APF Sensory.:  decreased sensation LLE compared to RLE, intact BUE Coordination: Rapid alternating movements normal in all extremities on right side. Finger-to-nose and heel-to-shin performed accurately on right side. Gait and Station: stands from seated position with mild difficulty.  Gait demonstrates hemiplegic circumduction gait with use of quad cane.  Tandem walk and heel toe not attempted Reflexes: Brisk on left side, 1+ right side.  Toes downgoing.          ASSESSMENT: [name] is a 57 y.o. year old male with right thalamic ICH with IVH on 06/09/2020 like secondary to uncontrolled HTN with seizure activity on 5/27. Vascular risk factors include HTN, HLD, cardiomyopathy, CHF, and etoh use .     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 Referral placed to neuro rehab PT/OT as requested. Continued use of cane at all times unless otherwise instructed. Low suspicion facial swelling symptoms stroke related, discussed possible allergy. Advised to f/u with PCP. Advised  if he started to have difficulty swallowing or breathing, he should be calling 911 immediately for emergent evaluation Continue to follow with PMR as scheduled for ongoing left-sided neuropathic pain  Discussed secondary stroke prevention measures and importance of close PCP follow up for aggressive stroke risk factor management including HTN with BP goal<130/90 and HLD with LDL goal<70.  Recheck lipid panel today. I have gone over the pathophysiology of stroke, warning signs and symptoms, risk factors and their management in some detail with instructions to go to the closest emergency room for symptoms of concern.  Chronic headaches:  No recent headaches, previously associated with blurred vision and imbalance Continue topiramate 50 mg twice daily -refill provided Headache present initially after ICH but resolved shortly after discharge. Reoccurrence around 02/2021.  Repeat MR brain 03/2021 no new or acute findings, noted reduction in size of prior ICH   Seizure, post stroke: continue keppra 500mg  twice daily for seizure prevention -refill provided by PMR     Follow up in 6 months or call earlier if needed    CC:  PCP: Rema Fendt, NP    I spent 36 minutes of face-to-face and non-face-to-face time with patient.  This included previsit chart review, lab review, study review, order entry, electronic health record documentation, patient education and discussion regarding above diagnoses and treatment plan and answered all the questions to patient's satisfaction  Ihor Austin, [facility]  [facility] [address] [place], [place] [address]  Phone [phone] Fax [phone] Note: This document was prepared with  digital dictation and possible smart phrase technology. Any transcriptional errors that result from this process are unintentional.

## 2022-12-18 NOTE — Telephone Encounter (Signed)
Called [employer] to check status of auth, they transferred me to [facility] medical benefit management. Rep states that [phone] and all CPT codes listed do not require prior auth. Ref # for call is [phone] CT. Pt will be buy/bill.

## 2022-12-19 ENCOUNTER — Ambulatory Visit: Payer: Medicaid Other | Admitting: Neurology

## 2022-12-27 ENCOUNTER — Telehealth: Payer: Self-pay | Admitting: Physical Medicine and Rehabilitation

## 2022-12-27 ENCOUNTER — Other Ambulatory Visit: Payer: Self-pay | Admitting: Cardiovascular Disease

## 2022-12-27 NOTE — Telephone Encounter (Signed)
Patient called in and would like to discuss xray results, informed patient we attempted to contact but no response. Patient states he is still having knee pain

## 2022-12-28 LAB — BASIC METABOLIC PANEL
BUN/Creatinine Ratio: 13 (ref 9–20)
BUN: 14 mg/dL (ref 6–24)
CO2: 22 mmol/L (ref 20–29)
Calcium: 10 mg/dL (ref 8.7–10.2)
Chloride: 104 mmol/L (ref 96–106)
Creatinine, Ser: 1.09 mg/dL (ref 0.76–1.27)
Glucose: 73 mg/dL (ref 70–99)
Potassium: 4.1 mmol/L (ref 3.5–5.2)
Sodium: 141 mmol/L (ref 134–144)
eGFR: 78 mL/min/{1.73_m2} (ref >59)

## 2023-01-08 ENCOUNTER — Encounter: Payer: Medicare Other | Admitting: Physical Medicine and Rehabilitation

## 2023-01-10 ENCOUNTER — Telehealth: Payer: Self-pay | Admitting: Physical Medicine and Rehabilitation

## 2023-01-10 NOTE — Telephone Encounter (Signed)
Patient came in needs to renew his medicaid before 06/23/2023 and he needs a letter stating his disability, he would like it mailed to him if possible .

## 2023-01-12 ENCOUNTER — Telehealth: Payer: Self-pay | Admitting: Physical Medicine and Rehabilitation

## 2023-01-12 NOTE — Telephone Encounter (Signed)
Letter sent to patient.

## 2023-01-12 NOTE — Telephone Encounter (Signed)
Sent letter to staff

## 2023-01-18 ENCOUNTER — Other Ambulatory Visit: Payer: Self-pay | Admitting: Family

## 2023-01-18 DIAGNOSIS — E782 Mixed hyperlipidemia: Secondary | ICD-10-CM

## 2023-01-23 NOTE — Progress Notes (Deleted)
 [facility] [address] [place]. [place] [address] [phone]       STROKE FOLLOW UP NOTE  [name] Date of Birth:  06-15-1965 Medical Record Number:  [id number]    Primary neurologist: Dr. Rosemarie Reason for Referral: stroke, seizure and headache follow up    SUBJECTIVE:   CHIEF COMPLAINT:  No chief complaint on file.   HPI:   Update 01/23/2023 JM: Patient returns for yearly stroke and seizure follow-up.     He was evaluated by Dr. Onita in October due to worsening left hand function after a fall in June.  EMG/NCV showed mild bilateral carpal tunnel syndromes.  In the process of being scheduled to start Xeomin injections for spasticity.  Denies any seizure activity, remains on Keppra  without side effects.  Remains on topiramate  for headache prophylaxis     History provided for reference purposes only Update 10/31/2021 JM: Patient returns for follow-up after prior visit 7 months ago regarding history of ICH, seizures and headaches.  He is unaccompanied.  Reports residual left sided weakness and nerve pain. Was previously just in foot, but now going up his whole leg to his hip.  At prior visit with PMR, recommended trialing different medications but he wishes to try to avoid medications if able.  He questions restarting therapies.  Continues to ambulate with a cane, denies any recent falls.  Does mention left side of face and tongue feels swollen over the past 2 weeks, denies any change in diet or medications.  Denies any difficulty breathing or swallowing. Compliant on Keppra , no seizure activity. Has not had any recent headaches, currently on topiramate  50 mg twice daily. On atorvastatin , denies side effects.  Blood pressure well controlled, today 112/65.  Is being seen by cardiology next week for further blood pressure management.  No further concerns at this time.  Update 03/23/2021 JM: 57 year old male with history of right thalamic ICH with IVH  and symptomatic seizures.  Returns today for follow-up visit unaccompanied.  Overall stable from stroke standpoint without new stroke/TIA symptoms.  Reports residual left-sided weakness with left arm and foot neuropathic pain and intermittent blurred vision in left periphery bilaterally although improving. Seen by PMR 2 days ago - continues to manage pain. He frustrated by continued pain and is looking at different home options to help with pain such as tens units. Left Shoulder xray showed subluxation and prominent acromioclavicular and glenohumeral degenerative change.  Continues working with PT/OT. Awaiting AFO brace, recently ordered by PMR. Ambulates with quad cane, no recent falls. Compliant on Keppra , denies side effects, no seizure activity noted.  Compliant on topiramate  50mg  qhs for headaches that were present during hospitalization, denies side effects, does report some worsening headaches over the past 2 weeks, reports 2 headaches left frontal area associated with blurred vision in left eye and imbalance (leaning backwards), typically will resolve shortly after taking Tylenol , denies photophobia, phonophobia, or N/V. Describes as dull pain, denies pulsating, pressure or stabbing sensation.  He is routinely followed by Dr. Cleotilde ophthalmologist but has not had evaluation since worsening headaches with visual issues. Compliant on atorvastatin  without side effects.  Blood pressure today 136/75.  No further concerns at this time.  Initial visit 09/01/2020 JM: [name] is being seen for hospital follow-up accompanied by his wife, [name].  Reports residual left-sided weakness, left shoulder pain and left foot pain, right side back and hip pain, and occasional blurred vision in left periphery. Currently working with [facility] PT/OT/SLP.  He ambulates with hemiwalker during therapy sessions otherwise transfers via wheelchair. Seen by PMR (8/2) via telemed visit and currently on Tylenol , Norco, tizanidine , lidocaine   patch and gabapentin . Has f/u visit 8/26 with OV note mentioning trial of Botox.  Continues to experience left foot numbness and pain worse at night -gabapentin  helps some.  Denies new stroke/TIA symptoms.  Remains on Keppra  500 mg twice daily tolerating without seizure activity.  Remains on topiramate  for headache prophylaxis.  Compliant on atorvastatin  40 mg daily -denies side effects.  Blood pressure today 142/81 -routinely monitors at home and has been stable. Per wife, plans on being seen by urology for voiding difficulties post stroke and current use of tamsulosin .  No further concerns at this time.  Stroke admission 06/09/2020 [name] is a 57 y.o. male with history of hypertension, obesity, cardiomyopathy, hyperlipidemia, CHF admitted on 06/09/2020 for right-sided gaze, right-sided weakness numbness and fall.  Personally reviewed hospitalization pertinent progress notes, lab work and imaging.  Evaluated by Dr. Jerri for right thalamic ICH with IVH with midline shift and edema likely due to uncontrolled HTN.  BP treated with Cleviprex  with adjustments made to home regimen. MRA no vascular malformations. CUS unremarkable.  EF 65 to 70%.  UDS negative.  LDL 78 on atorvastatin  40 mg daily.  A1c 5.5.  Other stroke risk factors include EtOH use, obesity and suspected sleep apnea.  Stroke team signed off on 5/24 but reevaluated on 5/27 due to altered mental status unresponsive to verbal and tactile stimuli, neck jerking to the left and left gaze deviation likely partial complex seizure.  Patient on Depakote  for headache prophylaxis with Depakote  level 45. CT head stable ICH and IVH with mild hydrocephalus.  EEG and LTM EEG no evidence of seizures.  Placed on Keppra  500 mg twice daily for seizure prophylaxis.  No additional seizures.  Hospital course complicated by sundowning placed on Seroquel , AKI and headaches.  Therapies recommended CIR for residual dense left hemiplegia, right gaze preference,  dysphagia, fatigue and visual deficits. CIR admission 5/31-6/29 -significant for left shoulder pain due to mild subluxation, right-sided low back and hip pain and headaches.  PERTINENT IMAGING/LABS  Per hospitalization 06/09/2020 CT head showed right thalamic ICH with IVH with 4 mm midline shift Repeat CT head no hematoma expansion MRI stable right thalamic ICH with IVH and midline shift MRA no aneurysm or AVM CT repeat x 2 stable right thalamic hemorrhage with mild intraventricular extension. 2D Echo EF 65-70% CUS unremarkable LDL 78 HgbA1c 5.5      ROS:   14 system review of systems performed and negative with exception of those listed in HPI  PMH:  Past Medical History:  Diagnosis Date   Anxiety    Cataract    removed left eye   Chronic combined systolic and diastolic CHF (congestive heart failure) (HCC)    EF 25-30%   Diastolic dysfunction    Dyslipidemia    Hyperlipidemia    Hypertension    Neuromuscular disorder (HCC)    neuropathy left foot   Nonischemic dilated cardiomyopathy (HCC)    felt secondary to HTN with no ischemia on nuclear stress test 05/2012, EF 25-30%   Seizures (HCC)    last seizure 05-2020- on Keppra    Shortness of breath    Stroke (HCC) 05/2020    PSH:  Past Surgical History:  Procedure Laterality Date   COLONOSCOPY WITH PROPOFOL  N/A 12/09/2020   Procedure: COLONOSCOPY WITH PROPOFOL ;  Surgeon: Leigh Elspeth SQUIBB, MD;  Location: [facility];  Service: Gastroenterology;  Laterality: N/A;   KNEE ARTHROSCOPY Left 04/13/2016   Procedure: ARTHROSCOPY KNEE WITH DEBRIDEMENT;  Surgeon: Maude LELON Right, MD;  Location: [facility];  Service: Orthopedics;  Laterality: Left;   KNEE ARTHROSCOPY WITH MEDIAL MENISECTOMY Right 04/13/2016   Procedure: KNEE ARTHROSCOPY WITH MEDIAL MENISECTOMY;  Surgeon: Maude LELON Right, MD;  Location: [facility];  Service: Orthopedics;  Laterality: LEFT not right knee   NASAL FRACTURE SURGERY      SHOULDER SURGERY Left     Social History:  Social History   Socioeconomic History   Marital status: Married    Spouse name: Not on file   Number of children: Not on file   Years of education: Not on file   Highest education level: Not on file  Occupational History   Not on file  Tobacco Use   Smoking status: Former    Types: Cigarettes    Passive exposure: Past   Smokeless tobacco: Never  Vaping Use   Vaping status: Never Used  Substance and Sexual Activity   Alcohol  use: Not Currently   Drug use: Not Currently    Types: Cocaine, Marijuana, Oxycodone    Sexual activity: Yes  Other Topics Concern   Not on file  Social History Narrative   Not on file   Social Drivers of Health   Financial Resource Strain: Not on file  Food Insecurity: Not on file  Transportation Needs: Not on file  Physical Activity: Not on file  Stress: Not on file  Social Connections: Unknown (06/07/2021)   Received from [facility], [facility]   Social Network    Social Network: Not on file  Intimate Partner Violence: Unknown (04/28/2021)   Received from [employer], [facility]   HITS    Physically Hurt: Not on file    Insult or Talk Down To: Not on file    Threaten Physical Harm: Not on file    Scream or Curse: Not on file    Family History:  Family History  Problem Relation Age of Onset   Hypertension Mother        Passed away when she is 11 secondary to hip surgery complications   CAD Mother    Heart attack Mother    Hypertension Father    Diabetes Brother    Stroke Brother    Heart attack Maternal Grandmother    Colon cancer Neg Hx    Esophageal cancer Neg Hx    Pancreatic cancer Neg Hx    Stomach cancer Neg Hx    Colon polyps Neg Hx     Medications:   Current Outpatient Medications on File Prior to Visit  Medication Sig Dispense Refill   amLODipine  (NORVASC ) 2.5 MG tablet Take 1 tablet (2.5 mg total) by mouth daily. 90 tablet 3   atorvastatin  (LIPITOR) 80 MG  tablet Take 1 tablet (80 mg total) by mouth daily. 90 tablet 1   baclofen  (LIORESAL ) 10 MG tablet Take 1 tablet (10 mg total) by mouth at bedtime. 90 each 3   folic acid  (FOLVITE ) 1 MG tablet Take 1 tablet by mouth once daily 30 tablet 3   gabapentin  (NEURONTIN ) 300 MG capsule Take 1 capsule (300 mg total) by mouth 3 (three) times daily. 270 capsule 3   labetalol  (NORMODYNE ) 100 MG tablet Take 1 tablet (100 mg total) by mouth 2 (two) times daily. 180 tablet 0   losartan  (COZAAR ) 50 MG tablet Take 1 tablet by  mouth twice daily 180 tablet 0   Multiple Vitamins-Minerals (CERTAVITE/ANTIOXIDANTS) TABS Take 1 tablet by mouth daily. 30 tablet 0   tadalafil (CIALIS) 5 MG tablet Take 5 mg by mouth every morning.     tamsulosin  (FLOMAX ) 0.4 MG CAPS capsule TAKE 1 CAPSULE BY MOUTH ONCE DAILY AFTER SUPPER 30 capsule 0   thiamine  100 MG tablet Take 1 tablet (100 mg total) by mouth daily. 30 tablet 0   topiramate  (TOPAMAX ) 100 MG tablet Take 1 tablet (100 mg total) by mouth 2 (two) times daily. 180 tablet 3   No current facility-administered medications on file prior to visit.    Allergies:   Allergies  Allergen Reactions   Tamsulosin      Other Reaction(s): Not available      OBJECTIVE:  Physical Exam  There were no vitals filed for this visit.  There is no height or weight on file to calculate BMI. No results found.  General: well developed, well nourished, very pleasant middle-aged African-American male, seated, in no evident distress Head: head normocephalic and atraumatic.   Neck: supple with no carotid or supraclavicular bruits Cardiovascular: regular rate and rhythm, no murmurs Musculoskeletal: no deformity; pitting edema L>R chronic  Skin:  no rash/petichiae Vascular:  Normal pulses all extremities   Neurologic Exam Mental Status: Awake and fully alert.  Fluent speech and language.  Oriented to place and time. Recent memory mildly impaired and remote memory intact. Attention span,  concentration and fund of knowledge appropriate during visit. Mood and affect appropriate.  Cranial Nerves: Pupils equal, briskly reactive to light. Extraocular movements full without nystagmus. Visual fields full to confrontation. Hearing intact.  Mild left lower facial weakness.  Tongue, palate moves normally and symmetrically.  Motor: Normal strength, bulk and tone right upper and lower extremity LUE: 4-/5 deltoid and handgrip, 4/5elbow flexion and extension, increased tone throughout LLE: 3/5 hip flexor, 4+/5 KF and KE, 4/5 ADF and APF Sensory.:  decreased sensation LLE compared to RLE, intact BUE Coordination: Rapid alternating movements normal in all extremities on right side. Finger-to-nose and heel-to-shin performed accurately on right side. Gait and Station: stands from seated position with mild difficulty.  Gait demonstrates hemiplegic circumduction gait with use of quad cane.  Tandem walk and heel toe not attempted Reflexes: Brisk on left side, 1+ right side.  Toes downgoing.          ASSESSMENT: [name] is a 57 y.o. year old male with right thalamic ICH with IVH on 06/09/2020 like secondary to uncontrolled HTN with seizure activity on 5/27. Vascular risk factors include HTN, HLD, cardiomyopathy, CHF, and etoh use .     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 Referral placed to neuro rehab PT/OT as requested. Continued use of cane at all times unless otherwise instructed. Low suspicion facial swelling symptoms stroke related, discussed possible allergy. Advised to f/u with PCP. Advised if he started to have difficulty swallowing or breathing, he should be calling 911 immediately for emergent evaluation Continue to follow with PMR as scheduled for ongoing left-sided neuropathic pain  Discussed secondary stroke prevention measures and importance of close PCP follow up for  aggressive stroke risk factor management including HTN with BP goal<130/90 and HLD with LDL goal<70.  Recheck lipid panel today. I have gone over the pathophysiology of stroke, warning signs and symptoms, risk factors and their management in some detail with  instructions to go to the closest emergency room for symptoms of concern.  Chronic headaches:  No recent headaches, previously associated with blurred vision and imbalance Continue topiramate  50 mg twice daily -refill provided Headache present initially after ICH but resolved shortly after discharge. Reoccurrence around 02/2021.  Repeat MR brain 03/2021 no new or acute findings, noted reduction in size of prior ICH   Seizure, post stroke: continue keppra  500mg  twice daily for seizure prevention -refill provided by PMR     Follow up in 6 months or call earlier if needed    CC:  PCP: Lorren Greig PARAS, NP    I spent 36 minutes of face-to-face and non-face-to-face time with patient.  This included previsit chart review, lab review, study review, order entry, electronic health record documentation, patient education and discussion regarding above diagnoses and treatment plan and answered all the questions to patient's satisfaction  Harlene Bogaert, [facility]  [facility] [address] [place], [place] [address]  Phone [phone] Fax [phone] Note: This document was prepared with digital dictation and possible smart phrase technology. Any transcriptional errors that result from this process are unintentional.

## 2023-01-25 ENCOUNTER — Ambulatory Visit: Payer: Medicare Other | Admitting: Adult Health

## 2023-01-25 ENCOUNTER — Encounter: Payer: Self-pay | Admitting: Adult Health

## 2023-01-25 ENCOUNTER — Telehealth: Payer: Self-pay | Admitting: Adult Health

## 2023-01-25 NOTE — Telephone Encounter (Signed)
 Thank you. Patient already has a scheduled visit in May (looks like today's visit was scheduled for a sooner visit and May appointment was never cancelled), can keep that appointment at this time but if no shows for that appointment, will be required to dismiss from practice. Can patient be please advised? Thank you.

## 2023-01-25 NOTE — Telephone Encounter (Signed)
 Good morning! Just an Lorain Childes, this pt had 3rd NS this AM since June 2024.

## 2023-01-29 ENCOUNTER — Encounter
Payer: Medicare Other | Attending: Physical Medicine and Rehabilitation | Admitting: Physical Medicine and Rehabilitation

## 2023-01-29 VITALS — BP 146/81 | HR 67

## 2023-01-29 DIAGNOSIS — I10 Essential (primary) hypertension: Secondary | ICD-10-CM | POA: Insufficient documentation

## 2023-01-29 DIAGNOSIS — G629 Polyneuropathy, unspecified: Secondary | ICD-10-CM | POA: Diagnosis not present

## 2023-01-29 DIAGNOSIS — R3911 Hesitancy of micturition: Secondary | ICD-10-CM | POA: Diagnosis not present

## 2023-01-29 DIAGNOSIS — E782 Mixed hyperlipidemia: Secondary | ICD-10-CM | POA: Diagnosis present

## 2023-01-29 MED ORDER — LOSARTAN POTASSIUM 50 MG PO TABS
50.0000 mg | ORAL_TABLET | Freq: Every day | ORAL | 3 refills | Status: DC
Start: 2023-01-29 — End: 2023-08-06

## 2023-01-29 MED ORDER — ATORVASTATIN CALCIUM 80 MG PO TABS
80.0000 mg | ORAL_TABLET | Freq: Every day | ORAL | 1 refills | Status: DC
Start: 2023-01-29 — End: 2023-04-30

## 2023-01-29 MED ORDER — TAMSULOSIN HCL 0.4 MG PO CAPS
ORAL_CAPSULE | ORAL | 3 refills | Status: DC
Start: 2023-01-29 — End: 2023-02-20

## 2023-01-29 NOTE — Patient Instructions (Addendum)
 HTN: -Advised checking BP daily at home and logging results to bring into follow-up appointment with PCP and myself. -Reviewed BP meds today.  -Advised regarding healthy foods that can help lower blood pressure and provided with a list: 1) citrus foods- high in vitamins and minerals 2) salmon and other fatty fish - reduces inflammation and oxylipins 3) swiss chard (leafy green)- high level of nitrates 4) pumpkin seeds- one of the best natural sources of magnesium 5) Beans and lentils- high in fiber, magnesium, and potassium 6) Berries- high in flavonoids 7) Amaranth (whole grain, can be cooked similarly to rice and oats)- high in magnesium and fiber 8) Pistachios- even more effective at reducing BP than other nuts 9) Carrots- high in phenolic compounds that relax blood vessels and reduce inflammation 10) Celery- contain phthalides that relax tissues of arterial walls 11) Tomatoes- can also improve cholesterol and reduce risk of heart disease 12) Broccoli- good source of magnesium, calcium , and potassium 13) Greek yogurt: high in potassium and calcium  14) Herbs and spices: Celery seed, cilantro, saffron, lemongrass, black cumin, ginseng, cinnamon, cardamom, sweet basil, and ginger 15) Chia and flax seeds- also help to lower cholesterol and blood sugar 16) Beets- high levels of nitrates that relax blood vessels  17) spinach and bananas- high in potassium  -Provided lise of supplements that can help with hypertension:  1) magnesium: one high quality brand is Bioptemizers since it contains all 7 types of magnesium, otherwise over the counter magnesium gluconate 400mg  is a good option 2) B vitamins 3) vitamin D 4) potassium 5) CoQ10 6) L-arginine 7) Vitamin C 8) Beetroot -Educated that goal BP is 120/80. -Made goal to incorporate some of the above foods into diet.     Lemon Cayenne tumeric Apple cide vinegar Turmeric Black pepper

## 2023-01-29 NOTE — Progress Notes (Signed)
 Subjective:    Patient ID: [name], male    DOB: 1965-07-27, 58 y.o.   MRN: [id number]  HPI [name] is a 58 year old man who presents for follow-up of CVA  1) Overweight: -has lost a lot of weight! -he is scared to eat his wife's food  -discussed that weight is down to 192  lbs -tries to do 10 laps back and forth  2) Problems with spouse -under adult protective services now -she has been hacking into his phone.  -his wife has a new boyfriend and he thinks this has influenced her -his ex forged his signature with the IRS  -discussed that his wife came looking for him at the [place] [facility] and his [facility] is supportive of him.  -his wife hacked his phone  3) CVA -he asks for refill of gabapentin  for his neuropathy -he is walking -using the cane -nerve pain has been stable  4) HTN: -has been off BP meds since 12/19  5) Neuropathy: -needs refill of gabapentin  -was told that his nerves are good.   6) Left shoulder pain: -has been following with orthopedic and has been treated with steroid injections  7) Right knee pain -felt a pop when he was going to the dining hall at the [place] [facility] -has lost power when he is trying to go up steps -they told him in therapy that they may have worn out his right side.  -still hurting -voltaren  gel is not helping, he is only using at [facility]    Pain Inventory Average Pain 7 Pain Right Now 7 My pain is intermittent, sharp, and tingling  LOCATION OF PAIN  Shoulder, Hand, Fingers, Leg, Toes  BOWEL Number of stools per week: 7   BLADDER Normal Flow not steady    Mobility use a cane how many minutes can you walk? 30 ability to climb steps?  yes do you drive?  yes Do you have any goals in this area?  yes  Function disabled: date disabled . I need assistance with the following:  meal prep and household duties  Neuro/Psych numbness tingling  Prior Studies Any changes since last visit?  no  Physicians involved in your  care Any changes since last visit?  no   Family History  Problem Relation Age of Onset   Hypertension Mother        Passed away when she is 57 secondary to hip surgery complications   CAD Mother    Heart attack Mother    Hypertension Father    Diabetes Brother    Stroke Brother    Heart attack Maternal Grandmother    Colon cancer Neg Hx    Esophageal cancer Neg Hx    Pancreatic cancer Neg Hx    Stomach cancer Neg Hx    Colon polyps Neg Hx    Social History   Socioeconomic History   Marital status: Married    Spouse name: Not on file   Number of children: Not on file   Years of education: Not on file   Highest education level: Not on file  Occupational History   Not on file  Tobacco Use   Smoking status: Former    Types: Cigarettes    Passive exposure: Past   Smokeless tobacco: Never  Vaping Use   Vaping status: Never Used  Substance and Sexual Activity   Alcohol  use: Not Currently   Drug use: Not Currently    Types: Cocaine, Marijuana, Oxycodone    Sexual  activity: Yes  Other Topics Concern   Not on file  Social History Narrative   Not on file   Social Drivers of Health   Financial Resource Strain: Not on file  Food Insecurity: Not on file  Transportation Needs: Not on file  Physical Activity: Not on file  Stress: Not on file  Social Connections: Unknown (06/07/2021)   Received from [facility], [facility]   Social Network    Social Network: Not on file   Past Surgical History:  Procedure Laterality Date   COLONOSCOPY WITH PROPOFOL  N/A 12/09/2020   Procedure: COLONOSCOPY WITH PROPOFOL ;  Surgeon: Leigh Elspeth SQUIBB, MD;  Location: [facility];  Service: Gastroenterology;  Laterality: N/A;   KNEE ARTHROSCOPY Left 04/13/2016   Procedure: ARTHROSCOPY KNEE WITH DEBRIDEMENT;  Surgeon: Maude LELON Right, MD;  Location: [facility];  Service: Orthopedics;  Laterality: Left;   KNEE ARTHROSCOPY WITH MEDIAL MENISECTOMY Right 04/13/2016    Procedure: KNEE ARTHROSCOPY WITH MEDIAL MENISECTOMY;  Surgeon: Maude LELON Right, MD;  Location:  SURGERY CENTER;  Service: Orthopedics;  Laterality: LEFT not right knee   NASAL FRACTURE SURGERY     SHOULDER SURGERY Left    Past Medical History:  Diagnosis Date   Anxiety    Cataract    removed left eye   Chronic combined systolic and diastolic CHF (congestive heart failure) (HCC)    EF 25-30%   Diastolic dysfunction    Dyslipidemia    Hyperlipidemia    Hypertension    Neuromuscular disorder (HCC)    neuropathy left foot   Nonischemic dilated cardiomyopathy (HCC)    felt secondary to HTN with no ischemia on nuclear stress test 05/2012, EF 25-30%   Seizures (HCC)    last seizure 05-2020- on Keppra    Shortness of breath    Stroke (HCC) 05/2020   BP (!) 146/81   Pulse 67   SpO2 97%   Opioid Risk Score:   Fall Risk Score:  `1  Depression screen PHQ 2/9     01/29/2023    1:24 PM 10/24/2022    1:18 PM 08/07/2022   11:12 AM 03/10/2022   10:40 AM 02/20/2022    1:40 PM 11/22/2021   10:27 AM 09/14/2021    2:14 PM  Depression screen PHQ 2/9  Decreased Interest 0 0 0 0 0 0 0  Down, Depressed, Hopeless 0 0 0 0 1 0 0  PHQ - 2 Score 0 0 0 0 1 0 0  Altered sleeping     0 0   Tired, decreased energy     0 0   Change in appetite     1 0   Feeling bad or failure about yourself      0 0   Trouble concentrating     0 0   Moving slowly or fidgety/restless     0 0   Suicidal thoughts     0 0   PHQ-9 Score     2 0   Difficult doing work/chores     Somewhat difficult Not difficult at all     Gen: no distress, normal appearing HEENT: oral mucosa pink and moist, NCAT Cardio: Reg rate Chest: normal effort, normal rate of breathing Abd: soft, non-distended Ext: no edema Psych: pleasant, normal affect Skin: intact MSK: Left hand and foot weakness. Ambulating with cane Right knee TTP     Family History  Problem Relation Age of Onset   Hypertension Mother  Passed away  when she is 79 secondary to hip surgery complications   CAD Mother    Heart attack Mother    Hypertension Father    Diabetes Brother    Stroke Brother    Heart attack Maternal Grandmother    Colon cancer Neg Hx    Esophageal cancer Neg Hx    Pancreatic cancer Neg Hx    Stomach cancer Neg Hx    Colon polyps Neg Hx    Social History   Socioeconomic History   Marital status: Married    Spouse name: Not on file   Number of children: Not on file   Years of education: Not on file   Highest education level: Not on file  Occupational History   Not on file  Tobacco Use   Smoking status: Former    Types: Cigarettes    Passive exposure: Past   Smokeless tobacco: Never  Vaping Use   Vaping status: Never Used  Substance and Sexual Activity   Alcohol  use: Not Currently   Drug use: Not Currently    Types: Cocaine, Marijuana, Oxycodone    Sexual activity: Yes  Other Topics Concern   Not on file  Social History Narrative   Not on file   Social Drivers of Health   Financial Resource Strain: Not on file  Food Insecurity: Not on file  Transportation Needs: Not on file  Physical Activity: Not on file  Stress: Not on file  Social Connections: Unknown (06/07/2021)   Received from [facility], [facility]   Social Network    Social Network: Not on file   Past Surgical History:  Procedure Laterality Date   COLONOSCOPY WITH PROPOFOL  N/A 12/09/2020   Procedure: COLONOSCOPY WITH PROPOFOL ;  Surgeon: Leigh Elspeth SQUIBB, MD;  Location: [facility];  Service: Gastroenterology;  Laterality: N/A;   KNEE ARTHROSCOPY Left 04/13/2016   Procedure: ARTHROSCOPY KNEE WITH DEBRIDEMENT;  Surgeon: Maude LELON Right, MD;  Location: [facility];  Service: Orthopedics;  Laterality: Left;   KNEE ARTHROSCOPY WITH MEDIAL MENISECTOMY Right 04/13/2016   Procedure: KNEE ARTHROSCOPY WITH MEDIAL MENISECTOMY;  Surgeon: Maude LELON Right, MD;  Location: [facility];  Service:  Orthopedics;  Laterality: LEFT not right knee   NASAL FRACTURE SURGERY     SHOULDER SURGERY Left    Past Medical History:  Diagnosis Date   Anxiety    Cataract    removed left eye   Chronic combined systolic and diastolic CHF (congestive heart failure) (HCC)    EF 25-30%   Diastolic dysfunction    Dyslipidemia    Hyperlipidemia    Hypertension    Neuromuscular disorder (HCC)    neuropathy left foot   Nonischemic dilated cardiomyopathy (HCC)    felt secondary to HTN with no ischemia on nuclear stress test 05/2012, EF 25-30%   Seizures (HCC)    last seizure 05-2020- on Keppra    Shortness of breath    Stroke (HCC) 05/2020   There were no vitals taken for this visit.  Opioid Risk Score:   Fall Risk Score:  `1  Depression screen PHQ 2/9     10/24/2022    1:18 PM 08/07/2022   11:12 AM 03/10/2022   10:40 AM 02/20/2022    1:40 PM 11/22/2021   10:27 AM 09/14/2021    2:14 PM 07/21/2021    9:38 AM  Depression screen PHQ 2/9  Decreased Interest 0 0 0 0 0 0 0  Down, Depressed, Hopeless 0 0 0 1  0 0 0  PHQ - 2 Score 0 0 0 1 0 0 0  Altered sleeping    0 0    Tired, decreased energy    0 0    Change in appetite    1 0    Feeling bad or failure about yourself     0 0    Trouble concentrating    0 0    Moving slowly or fidgety/restless    0 0    Suicidal thoughts    0 0    PHQ-9 Score    2 0    Difficult doing work/chores    Somewhat difficult Not difficult at all      Review of Systems  Musculoskeletal:        Pain in Shoulder, Hand, Fingers, Leg, Toes  All other systems reviewed and are negative.      Assessment & Plan:   [name] is a 58 year old man presenting today for f/u of left hemiplegia, right sided low back pain, and left sided foot pain following stroke, left shoulder pain.  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  -refilled atorvastatin  for mixed HLD -discussed that he is going to the gym, continue going 6 days per week.   -discussed that he has a good social community at [employer], the gym, and the [place] -referred to PCP -referred to PT --progress to quad walker -face to face for AFO performed -discussed that most motor improvement occurs in the first 4 weeks, but improvement is still seen in the first 6 months, and some improvement may still be seen in the first 2 weeks -evaluated for vagal nerve stimulation trial for unfortunately will not qualify due to his stroke being hemorrhagic -will send him information about deep brain stimulation, discussed with him that I have seen this help one of my patients -Incredible improvement in ambulation!  -discussed that he is doing 10 laps daily and feeling great -refilled gabapentin  -graduated from therapy -Neuropathy is moving up to neck.  -was released from the hospital one year today -she is doing exercises.  -left hand is getting on his nerves -he thinks -his therapists are planning to take him to the pool and told him this may help with his arm pain -pain has been severe at home, as it had been in hospital -his wife purchased 500mg  tablets of tylenol  -he is worried about his decreased upper extremity strength and asks if this will ever improve -he is not experiencing much shoulder pain right now -he does have some strength -the benefits from the trigger point injections lwore off within 1 week -pain is severely limiting his function -he is taking Tizanidine  and it does not help, does make him sleepy, but his wife is trying to encourage him to stay up during the day -he developed the foot pain a couple of weeks ago -he was wearing a boot at that time so took it off but has still had pain a couple of weeks afterward. -he has not tried anything for this pain -it is worst at night and feels like burning and tingling at his heel. -he has been doing great with therapy and is walking in his home! -his wife asks if he could get a handicap placard.  -the foot is still  feeling numb with pins and needles, also painful. He is unsure whether Gabapentin  helps- so far nothing he as tried has helped his neuropathy. His balance is also  now off and he has been stumbling more. He actually felt that the Gabapentin  made his pain worse. He has never tried Cymbalta  but is willing to try it.  -Has been able to use bathroom himself! -he is working with therapy right now.  -he was feeling wobbly over the weekend so had to miss his PT session -shoulder is feeling very numb and cannot make a fist -ready to proceed with Qutenza  today -His wife asks for refill of Eucerin and Diclofenac  and Tizanidine .  -She asks whether he should still be taking Seroquel  -he called medical records and records were faxed on December 28th.  -He called our office today  -He needs medical records to process his disability claims -able to walk much better.  -his wife mentioned to me that she is still worried about him driving. He has been driving around locally to pharmacy   2) Lower extremity edema -worsening -not painful -he asks when his next appointment with me is.  -has gotten used to it -therapist was concerned -painful -sometimes feels cold on the left foot -never took the fluid pills -he is hoping to walk more now that the weather has improved D/c amlodipine   3) Headaches -started last week and felt loss of balance -this stopped him from doing therapy.  -still having tension in head.  -he takes topamax  25mg  HS.   4) Obesity -he has lost 64 lbs! -discussed that current weight is 186 lbs  5) Left shoulder pain -intermittent -sometimes he uses voltaren  gel and this seems to help the most -discussed that he has been following with ortho and benefitting from injections -discussed that he has 2 hearings coming up regarding his shoulder.   6) Left upper extremity neuropathy -started last month -received steroid injection 2 months ago. Asks if this is a side effect of the  steroid injection -asks about a supplement and if this is ok to take -he asks if there is anything else he can do for this  7) Cervical myofascial pain syndrome -he wants to know if injections could help  8) Neuropathy is getting worse -he gets depressed about this.  -refilled gabapentin  300mg  TID  9) Urinary frequency: -notes that this started after taking the Baclofen , but his wife said she never gave it to him -he did take the cymbalta  but stopped due to this potential side effect -he is urinating every 5-10 minutes -he is willing to get UA and UC drawn today.  Refilled flomax   10) Problems with wife -he believes his wife is trying to tell his healthcare providers he has dementia -he also feels that one day his cane was filled with water  to make it more difficult for him to use and that she did this, he says this was witnessed by his therapy team -his family is unaware of this as they have moved and he has not yet discussed with them -he would like his medical records to be restricted from his wife.  -he asks if I can help him find alternative housing -discussed that the [place] is giving him good support -discussed that his wife is tracking to hack his phone.  -discussed that his wife was falsely saying that she had dementia. -discussed that he ran into her at the bank.  -discussed that step daughter teamed up with her    65) HTN: -d/c amlodipine  --refilled losartan  and tamsulosin  -d/c tizanidine  -Advised checking BP daily at home and logging results to bring into follow-up appointment with PCP and myself. -  Reviewed BP meds today.  -Advised regarding healthy foods that can help lower blood pressure and provided with a list: 1) citrus foods- high in vitamins and minerals 2) salmon and other fatty fish - reduces inflammation and oxylipins 3) swiss chard (leafy green)- high level of nitrates 4) pumpkin seeds- one of the best natural sources of magnesium 5) Beans and lentils- high  in fiber, magnesium, and potassium 6) Berries- high in flavonoids 7) Amaranth (whole grain, can be cooked similarly to rice and oats)- high in magnesium and fiber 8) Pistachios- even more effective at reducing BP than other nuts 9) Carrots- high in phenolic compounds that relax blood vessels and reduce inflammation 10) Celery- contain phthalides that relax tissues of arterial walls 11) Tomatoes- can also improve cholesterol and reduce risk of heart disease 12) Broccoli- good source of magnesium, calcium , and potassium 13) Greek yogurt: high in potassium and calcium  14) Herbs and spices: Celery seed, cilantro, saffron, lemongrass, black cumin, ginseng, cinnamon, cardamom, sweet basil, and ginger 15) Chia and flax seeds- also help to lower cholesterol and blood sugar 16) Beets- high levels of nitrates that relax blood vessels  17) spinach and bananas- high in potassium  -Provided lise of supplements that can help with hypertension:  1) magnesium: one high quality brand is Bioptemizers since it contains all 7 types of magnesium, otherwise over the counter magnesium gluconate 400mg  is a good option 2) B vitamins 3) vitamin D 4) potassium 5) CoQ10 6) L-arginine 7) Vitamin C 8) Beetroot -Educated that goal BP is 120/80. -Made goal to incorporate some of the above foods into diet.    12) Left hand weakness: -ordered OT, continue  13) Tongue swelling:  -d/c tizanidine   14) Right knee pain:  -XR ordered, discussed spurring -recommended neoprene knee sleeve  15) Left wrist pain: -dicussed plan for nerve conduction study -continue brace

## 2023-02-04 ENCOUNTER — Other Ambulatory Visit: Payer: Medicare Other

## 2023-02-05 ENCOUNTER — Telehealth: Payer: Self-pay | Admitting: Family

## 2023-02-05 NOTE — Telephone Encounter (Signed)
 Marland Kitchen

## 2023-02-20 ENCOUNTER — Ambulatory Visit (INDEPENDENT_AMBULATORY_CARE_PROVIDER_SITE_OTHER): Payer: Medicaid Other | Admitting: Physician Assistant

## 2023-02-20 ENCOUNTER — Encounter: Payer: Self-pay | Admitting: Physician Assistant

## 2023-02-20 VITALS — BP 140/80 | HR 84 | Ht 69.0 in | Wt 191.4 lb

## 2023-02-20 DIAGNOSIS — K6289 Other specified diseases of anus and rectum: Secondary | ICD-10-CM | POA: Diagnosis not present

## 2023-02-20 DIAGNOSIS — R159 Full incontinence of feces: Secondary | ICD-10-CM

## 2023-02-20 DIAGNOSIS — N4289 Other specified disorders of prostate: Secondary | ICD-10-CM

## 2023-02-20 DIAGNOSIS — R8290 Unspecified abnormal findings in urine: Secondary | ICD-10-CM

## 2023-02-20 DIAGNOSIS — G8194 Hemiplegia, unspecified affecting left nondominant side: Secondary | ICD-10-CM

## 2023-02-20 DIAGNOSIS — R3989 Other symptoms and signs involving the genitourinary system: Secondary | ICD-10-CM

## 2023-02-20 NOTE — Progress Notes (Signed)
02/20/2023 [name] [id number] February 21, 1965  Referring provider: Rema Fendt, NP Primary GI doctor: Dr. Adela Lank  ASSESSMENT AND PLAN:   Fecal leakage/abnormal odor/abnormal sphincter tone Normal colon 2022 Metamucil twice a day was working but he had a fleet enema at [facility] urology and that started the issues again but more intermittent He had norovirus 2 weeks ago, went to [facility] hospital, had diarrhea during that time, now having incomplete Bm's, small volume but formed - continue fiber, increase amount to 2-3 x a day - will refer to pelvic floor PT for urine and stools - add on probiotic since had infection - follow up 2-3 months  Prostate abnormality -Getting biopsy with urology -Has some urinary hesitancy, decreased flow - will refer to PT, continue follow up with urology   Patient Care Team: Rema Fendt, NP as PCP - General (Nurse Practitioner) Nahser, Deloris Ping, MD as PCP - Cardiology (Cardiology)  HISTORY OF PRESENT ILLNESS: 58 y.o. male with a past medical history of CHF with decreased ejection fraction, seizures and previous stroke and others listed below presents for evaluation of stool leakage.   12/09/2020 colonoscopy with internal hemorrhoids and otherwise normal. Repeat recommended 10 years.  05/31/2022 office visit with Hyacinth Meeker for abnormal rectal small had fecal matter externally slightly decreased sphincter tone no masses or tenderness suggested adding physical therapy discussed pelvic floor PT  Discussed the use of AI scribe software for clinical note transcription with the patient, who gave verbal consent to proceed.  History of Present Illness             He  reports that he has quit smoking. His smoking use included cigarettes. He has been exposed to tobacco smoke. He has never used smokeless tobacco. He reports that he does not currently use alcohol. He reports that he does not currently use drugs after having used the  following drugs: Cocaine, Marijuana, and Oxycodone.  RELEVANT GI HISTORY, LABS, IMAGING:  CBC    Component Value Date/Time   WBC 4.7 11/23/2021 1133   WBC 6.0 07/19/2020 0530   RBC 4.58 11/23/2021 1133   RBC 3.61 (L) 07/19/2020 0530   HGB 13.9 11/23/2021 1133   HCT 42.0 11/23/2021 1133   PLT 314 11/23/2021 1133   MCV 92 11/23/2021 1133   MCH 30.3 11/23/2021 1133   MCH 31.6 07/19/2020 0530   MCHC 33.1 11/23/2021 1133   MCHC 33.4 07/19/2020 0530   RDW 11.2 (L) 11/23/2021 1133   LYMPHSABS 1.6 11/23/2021 1133   MONOABS 0.8 06/28/2020 1103   EOSABS 0.1 11/23/2021 1133   BASOSABS 0.0 11/23/2021 1133   No results for input(s): "HGB" in the last 8760 hours.  CMP     Component Value Date/Time   NA 141 12/27/2022 0000   K 4.1 12/27/2022 0000   CL 104 12/27/2022 0000   CO2 22 12/27/2022 0000   GLUCOSE 73 12/27/2022 0000   GLUCOSE 109 (H) 07/19/2020 0530   BUN 14 12/27/2022 0000   CREATININE 1.09 12/27/2022 0000   CALCIUM 10.0 12/27/2022 0000   PROT 6.7 11/09/2022 0834   ALBUMIN 4.3 11/09/2022 0834   AST 22 11/09/2022 0834   ALT 24 11/09/2022 0834   ALKPHOS 75 11/09/2022 0834   BILITOT 0.4 11/09/2022 0834   GFRNONAA >60 07/19/2020 0530   GFRAA >60 01/02/2018 1657      Latest Ref Rng & Units 11/09/2022    8:34 AM 11/23/2021   11:33 AM 09/29/2020  4:45 PM  Hepatic Function  Total Protein 6.0 - 8.5 g/dL 6.7  7.3  7.7   Albumin 3.8 - 4.9 g/dL 4.3  4.7  5.1   AST 0 - 40 IU/L 22  22  18    ALT 0 - 44 IU/L 24  24  22    Alk Phosphatase 44 - 121 IU/L 75  73  105   Total Bilirubin 0.0 - 1.2 mg/dL 0.4  0.5  0.4   Bilirubin, Direct 0.00 - 0.40 mg/dL 4.09         Current Medications:    Current Outpatient Medications (Cardiovascular):    amLODipine (NORVASC) 2.5 MG tablet, Take 1 tablet (2.5 mg total) by mouth daily.   atorvastatin (LIPITOR) 80 MG tablet, Take 1 tablet (80 mg total) by mouth daily.   labetalol (NORMODYNE) 100 MG tablet, Take 1 tablet (100 mg total) by mouth  2 (two) times daily.   losartan (COZAAR) 50 MG tablet, Take 1 tablet (50 mg total) by mouth daily.   tadalafil (CIALIS) 5 MG tablet, Take 5 mg by mouth every morning.    Current Outpatient Medications (Hematological):    folic acid (FOLVITE) 1 MG tablet, Take 1 tablet by mouth once daily  Current Outpatient Medications (Other):    baclofen (LIORESAL) 10 MG tablet, Take 1 tablet (10 mg total) by mouth at bedtime.   gabapentin (NEURONTIN) 300 MG capsule, Take 1 capsule (300 mg total) by mouth 3 (three) times daily.   Multiple Vitamins-Minerals (CERTAVITE/ANTIOXIDANTS) TABS, Take 1 tablet by mouth daily.   thiamine 100 MG tablet, Take 1 tablet (100 mg total) by mouth daily.   topiramate (TOPAMAX) 100 MG tablet, Take 1 tablet (100 mg total) by mouth 2 (two) times daily.  Medical History:  Past Medical History:  Diagnosis Date   Anxiety    Cataract    removed left eye   Chronic combined systolic and diastolic CHF (congestive heart failure) (HCC)    EF 25-30%   Diastolic dysfunction    Dyslipidemia    Hyperlipidemia    Hypertension    Neuromuscular disorder (HCC)    neuropathy left foot   Nonischemic dilated cardiomyopathy (HCC)    felt secondary to HTN with no ischemia on nuclear stress test 05/2012, EF 25-30%   Seizures (HCC)    last seizure 05-2020- on Keppra   Shortness of breath    Stroke (HCC) 05/2020   Allergies:  Allergies  Allergen Reactions   Tamsulosin     Other Reaction(s): Not available     Surgical History:  He  has a past surgical history that includes Nasal fracture surgery; Knee arthroscopy (Left, 04/13/2016); Knee arthroscopy with medial menisectomy (Right, 04/13/2016); Shoulder surgery (Left); and Colonoscopy with propofol (N/A, 12/09/2020). Family History:  His family history includes CAD in his mother; Diabetes in his brother; Heart attack in his maternal grandmother and mother; Hypertension in his father and mother; Stroke in his brother.  REVIEW OF  SYSTEMS  : All other systems reviewed and negative except where noted in the History of Present Illness.  PHYSICAL EXAM: BP (!) 140/80   Pulse 84   Ht 5\' 9"  (1.753 m)   Wt 191 lb 6 oz (86.8 kg)   BMI 28.26 kg/m  General Appearance: Well nourished, in no apparent distress. Head:   Normocephalic and atraumatic. Eyes:  sclerae anicteric,conjunctive pink  Respiratory: Respiratory effort normal, BS equal bilaterally without rales, rhonchi, wheezing. Cardio: RRR with no MRGs. Peripheral pulses intact.  Abdomen: Soft,  Obese ,active bowel sounds. No tenderness . No masses. Rectal: declines Musculoskeletal: Full ROM, Antalgic gait walk with cane, Without edema. Skin:  Dry and intact without significant lesions or rashes Neuro: Alert and  oriented x4;  No focal deficits. Psych:  Cooperative. Normal mood and affect.    Doree Albee, PA-C 3:39 PM

## 2023-02-20 NOTE — Patient Instructions (Addendum)
_______________________________________________________  If your blood pressure at your visit was 140/90 or greater, please contact your primary care physician to follow up on this.  _______________________________________________________  If you are age 60 or older, your body mass index should be between 23-30. Your Body mass index is 28.26 kg/m. If this is out of the aforementioned range listed, please consider follow up with your Primary Care Provider.  If you are age 60 or younger, your body mass index should be between 19-25. Your Body mass index is 28.26 kg/m. If this is out of the aformentioned range listed, please consider follow up with your Primary Care Provider.   ________________________________________________________  The [facility] providers would like to encourage you to use [facility] to communicate with providers for non-urgent requests or questions.  Due to long hold times on the telephone, sending your provider a message by [facility] may be a faster and more efficient way to get a response.  Please allow 48 business hours for a response.  Please remember that this is for non-urgent requests.  _______________________________________________________ FIBER SUPPLEMENT You can do metamucil or fibercon once or twice a day but if this causes gas/bloating please switch to Benefiber or Citracel.  Fiber is good for constipation/diarrhea/irritable bowel syndrome.  It can also help with weight loss and can help lower your bad cholesterol (LDL).  It can take up to a month before you can see a difference with your bowel movements.  It is cheapest from [employer], [employer], [employer].   CAN TRY TO INCREASE FIBER  Probiotics are microorganisms which may be beneficial to the function of the gastrointestinal tract and are used in several different GI conditions including Irritable Bowel Syndrome and following infections involving the GI tract.  Two commonly used probiotics are:  VSL#3  (Bifidobacterium breve, B. longum, B. infantis, Lactobacillus acidophilus, L. plantarum, L. paracasei, L. bulgaricus, Streptococcus thermophilus) and Align (B. Infantis).  I recommend you take one of these two probiotics once dailY for 1 MONTH   Toileting tips to help with your constipation - Drink at least 64-80 ounces of water/liquid per day. - Establish a time to try to move your bowels every day.  For many people, this is after a cup of coffee or after a meal such as breakfast. - Sit all of the way back on the toilet keeping your back fairly straight and while sitting up, try to rest the tops of your forearms on your upper thighs.   - Raising your feet with a step stool/squatty potty can be helpful to improve the angle that allows your stool to pass through the rectum. - Relax the rectum feeling it bulge toward the toilet water.  If you feel your rectum raising toward your body, you are contracting rather than relaxing. - Breathe in and slowly exhale. "Belly breath" by expanding your belly towards your belly button. Keep belly expanded as you gently direct pressure down and back to the anus.  A low pitched GRRR sound can assist with increasing intra-abdominal pressure.  (Can also trying to blow on a pinwheel and make it move, this helps with the same belly breathing) - Repeat 3-4 times. If unsuccessful, contract the pelvic floor to restore normal tone and get off the toilet.  Avoid excessive straining. - To reduce excessive wiping by teaching your anus to normally contract, place hands on outer aspect of knees and resist knee movement outward.  Hold 5-10 second then place hands just inside of  knees and resist inward movement of knees.  Hold 5 seconds.  Repeat a few times each way.  Go to the ER if unable to pass gas, severe AB pain, unable to hold down food, any shortness of breath of chest pain.  Pelvic Floor Dysfunction, Male     Pelvic floor dysfunction (PFD) is a condition that results  when the group of muscles and connective tissues that support the organs in the pelvis (pelvic floor muscles) do not work well. These muscles and their connections form a sling that supports the colon and bladder. In men, these muscles also support the prostate gland. PFD causes pelvic floor muscles to be too weak, too tight, or both. In PFD, muscle movements are not coordinated. This may cause bowel or bladder problems. It may also cause pain. What are the causes? This condition may be caused by an injury to the pelvic area or by a weakening of pelvic muscles. In many cases, the exact cause is not known. What increases the risk? The following factors may make you more likely to develop PFD: Having chronic bladder tissue inflammation (interstitial cystitis). Being an older person. Being overweight. History of radiation treatment for cancer in the pelvic region. Previous pelvic surgery, such as removal of the prostate gland (prostatectomy). What are the signs or symptoms? Symptoms of this condition vary and may include: Bladder symptoms, such as: Trouble starting urination and emptying the bladder. Frequent urinary tract infections. Leaking urine when coughing, laughing, or exercising (stress incontinence). Having to pass urine urgently or frequently. Pain when passing urine. Bowel symptoms, such as: Constipation. Urgent or frequent bowel movements. Incomplete bowel movements. Painful bowel movements. Leaking stool or gas. Unexplained genital or rectal pain. Genital or rectal muscle spasms. Low back pain. Sexual dysfunction, such as erectile dysfunction, premature ejaculation, or pain during or after sexual activity. How is this diagnosed? This condition is diagnosed based on: Your symptoms and medical history. A physical exam. During the exam, your health care provider may check your pelvic muscles for tightness, spasm, pain, or weakness. This may include a rectal exam. In some  cases, you may have diagnostic tests, such as: Electrical muscle function tests. Urine flow testing. X-ray tests of bowel function. Ultrasound of the pelvic organs. How is this treated? Treatment for this condition depends on your symptoms. Treatment options include: Physical therapy. This may include Kegel exercises to help relax or strengthen the pelvic floor muscles. Biofeedback. This type of therapy provides feedback on how tight your pelvic floor muscles are so that you can learn to control them. Massage therapy. A treatment that involves electrical stimulation of the pelvic floor muscles to help control pain (transcutaneous electrical nerve stimulation, or TENS). Sound wave therapy (ultrasound) to reduce muscle spasms. Medicines, such as: Muscle relaxants. Bladder control medicines. Surgery to reconstruct or support pelvic floor muscles may be an option if other treatments do not help. Follow these instructions at home: Activity Do your usual activities as told by your health care provider. Ask your health care provider if you should modify any activities. Do pelvic floor strengthening or relaxing exercises at home as told by your physical therapist. Lifestyle Maintain a healthy weight. Eat foods that are high in fiber, such as beans, whole grains, and fresh fruits and vegetables. Limit foods that are high in fat and processed sugars, such as fried or sweet foods. Manage stress with relaxation techniques such as yoga or meditation. General instructions If you have problems with leakage: Use absorbable pads  or wear padded underwear. Wash your genital and anal area frequently with mild soap. Keep your genital and anal area as clean and dry as possible. Ask your health care provider if you should try a barrier cream to prevent skin irritation. Take warm baths to relieve pelvic muscle tension or spasms. Take over-the-counter and prescription medicines only as told by your health  care provider. Keep all follow-up visits. How is this prevented? The cause of PFD is not always known, but there are a few things you can do to reduce the risk of developing this condition, including: Staying at a healthy weight. Getting regular exercise. Managing stress. Contact a health care provider if: Your symptoms are not improving with home care. You have signs or symptoms of PFD that get worse. You develop new signs or symptoms. You have signs of a urinary tract infection, such as: Fever. Chills. Increased urinary frequency. A burning feeling when urinating. You have not had a bowel movement in 3 days (constipation). Summary Pelvic floor dysfunction results when the muscles and connective tissues in your pelvic floor do not work well. These muscles and their connections form a sling that supports your colon and bladder. In men, these muscles also support the prostate gland. PFD may be caused by an injury to the pelvic area or by a weakening of pelvic muscles. PFD causes pelvic floor muscles to be too weak, too tight, or a combination of both. Symptoms may vary from person to person. In most cases, PFD can be treated with physical therapies and medicines. Surgery may be an option if other treatments do not help. This information is not intended to replace advice given to you by your health care provider. Make sure you discuss any questions you have with your health care provider. Document Revised: 05/19/2020 Document Reviewed: 05/19/2020 Elsevier Patient Education  2024 [employer] Inc[employer]   total hip arthroplasty     It was a pleasure to see you today!  Thank you for trusting me with your gastrointestinal care!

## 2023-02-21 NOTE — Progress Notes (Signed)
Agree with assessment and plan as outlined.

## 2023-02-26 ENCOUNTER — Encounter: Payer: Self-pay | Admitting: Family

## 2023-02-26 ENCOUNTER — Ambulatory Visit (INDEPENDENT_AMBULATORY_CARE_PROVIDER_SITE_OTHER): Payer: No Typology Code available for payment source | Admitting: Family

## 2023-02-26 VITALS — BP 125/78 | HR 79 | Temp 98.4°F | Ht 69.5 in | Wt 191.0 lb

## 2023-02-26 DIAGNOSIS — I10 Essential (primary) hypertension: Secondary | ICD-10-CM | POA: Diagnosis not present

## 2023-02-26 DIAGNOSIS — G8929 Other chronic pain: Secondary | ICD-10-CM | POA: Diagnosis not present

## 2023-02-26 DIAGNOSIS — M25562 Pain in left knee: Secondary | ICD-10-CM | POA: Diagnosis not present

## 2023-02-26 DIAGNOSIS — Z131 Encounter for screening for diabetes mellitus: Secondary | ICD-10-CM | POA: Diagnosis not present

## 2023-02-26 DIAGNOSIS — M25512 Pain in left shoulder: Secondary | ICD-10-CM

## 2023-02-26 LAB — POCT GLYCOSYLATED HEMOGLOBIN (HGB A1C): HbA1c, POC (controlled diabetic range): 4.9 % (ref 0.0–7.0)

## 2023-02-26 MED ORDER — LABETALOL HCL 100 MG PO TABS: 100.0000 mg | ORAL_TABLET | Freq: Two times a day (BID) | ORAL | 0 refills | Status: DC

## 2023-02-26 NOTE — Progress Notes (Signed)
Patient states knee problems states a sharp pain in knee when he walks on it.   State pain in left arm, says it wont go all the way down.

## 2023-02-26 NOTE — Progress Notes (Signed)
Patient ID: [name], male    DOB: 24-Mar-1965  MRN: [id number]  CC: Follow-Up  Subjective: [name] is a 58 y.o. male who presents for follow-up.   His concerns today include:  - Chronic left shoulder and chronic left knee pain. Denies recent trauma/injury and red flag symptoms. Using cane to assist with ambulation. States thinks related to history in the [employer]. Established with Neurology. Upcoming appointment with Orthopedics. Requests referral to Physical Therapy with Dr. Allyne Gee. - Diabetes screening.  - Established with Cardiology. Needs refills on Labetalol. He does not complain of red flag symptoms such as but not limited to chest pain, shortness of breath, worst headache of life, nausea/vomiting.    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) 10/24/2022   Chronic diastolic CHF (congestive heart failure) (HCC) 11/07/2021   Left ventricular hypertrophy 11/07/2021   Osteoarthritis of left glenohumeral joint 05/04/2021   Colon cancer screening    Left hemiplegia (HCC) 07/02/2020   Cognitive deficit due to recent stroke 07/02/2020   Dysphagia, post-stroke    Vascular headache    Acute blood loss anemia    AKI (acute kidney injury) (HCC)    Essential hypertension    Partial seizure (HCC)    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 90 tablet 3   atorvastatin (LIPITOR) 80 MG tablet Take 1 tablet (80 mg total) by mouth daily. 90 tablet 1   baclofen (LIORESAL) 10 MG tablet Take 1 tablet (10 mg total) by mouth at bedtime. 90 each 3   folic acid (FOLVITE) 1 MG tablet Take 1 tablet by mouth once daily 30 tablet 3   gabapentin (NEURONTIN) 300 MG capsule Take 1 capsule (300 mg total) by mouth 3 (three) times daily. 270 capsule 3   losartan (COZAAR) 50 MG tablet Take 1 tablet (50 mg total) by mouth daily. 90 tablet 3   Multiple Vitamins-Minerals (CERTAVITE/ANTIOXIDANTS) TABS Take 1 tablet by mouth daily. 30 tablet 0   tadalafil (CIALIS) 5 MG tablet Take 5 mg by mouth every morning.     thiamine 100 MG tablet Take 1 tablet (100 mg total) by mouth daily. 30 tablet 0   topiramate (TOPAMAX) 100 MG tablet Take 1 tablet (100 mg total) by mouth 2 (two) times daily. 180 tablet 3   No current facility-administered medications on file prior to visit.    Allergies  Allergen Reactions   Tamsulosin     Other Reaction(s): Not available    Social History   Socioeconomic History   Marital status: Married    Spouse name: Not on file   Number of children: Not on file   Years of education: Not on file   Highest education level: Not on file  Occupational History   Not on file  Tobacco Use   Smoking status: Former  Types: Cigarettes    Passive exposure: Past   Smokeless tobacco: Never  Vaping Use   Vaping status: Never Used  Substance and Sexual Activity   Alcohol use: Not Currently   Drug use: Not Currently    Types: Cocaine, Marijuana, Oxycodone   Sexual activity: Yes  Other Topics Concern   Not on file  Social History Narrative   Not on file   Social Drivers of Health   Financial Resource Strain: Not on file  Food Insecurity: Not on file  Transportation Needs: Not on file  Physical Activity: Not on file  Stress: Not on file  Social Connections:  Unknown (06/07/2021)   Received from [facility]   Social Network    Social Network: Not on file  Intimate Partner Violence: Unknown (04/28/2021)   Received from [facility]   HITS    Physically Hurt: Not on file    Insult or Talk Down To: Not on file    Threaten Physical Harm: Not on file    Scream or Curse: Not on file    Family History  Problem Relation Age of Onset   Hypertension Mother        Passed away when she is 44 secondary to hip surgery complications   CAD Mother    Heart attack Mother    Hypertension Father    Diabetes Brother    Stroke Brother    Heart attack Maternal Grandmother    Colon cancer Neg Hx    Esophageal cancer Neg Hx    Pancreatic cancer Neg Hx    Stomach cancer Neg Hx    Colon polyps Neg Hx     Past Surgical History:  Procedure Laterality Date   COLONOSCOPY WITH PROPOFOL N/A 12/09/2020   Procedure: COLONOSCOPY WITH PROPOFOL;  Surgeon: Benancio Deeds, MD;  Location: [facility];  Service: Gastroenterology;  Laterality: N/A;   KNEE ARTHROSCOPY Left 04/13/2016   Procedure: ARTHROSCOPY KNEE WITH DEBRIDEMENT;  Surgeon: Valeria Batman, MD;  Location: [facility];  Service: Orthopedics;  Laterality: Left;   KNEE ARTHROSCOPY WITH MEDIAL MENISECTOMY Right 04/13/2016   Procedure: KNEE ARTHROSCOPY WITH MEDIAL MENISECTOMY;  Surgeon: Valeria Batman, MD;  Location: [facility];  Service: Orthopedics;  Laterality: LEFT not right knee   NASAL FRACTURE SURGERY     SHOULDER SURGERY Left     ROS: Review of Systems Negative except as stated above  PHYSICAL EXAM: BP 125/78   Pulse 79   Temp 98.4 F (36.9 C) (Oral)   Ht 5' 9.5" (1.765 m)   Wt 191 lb (86.6 kg)   SpO2 95%   BMI 27.80 kg/m   Physical Exam HENT:     Head: Normocephalic and atraumatic.     Nose: Nose normal.     Mouth/Throat:     Mouth: Mucous membranes are moist.     Pharynx: Oropharynx is clear.  Eyes:      Extraocular Movements: Extraocular movements intact.     Conjunctiva/sclera: Conjunctivae normal.     Pupils: Pupils are equal, round, and reactive to light.  Cardiovascular:     Rate and Rhythm: Normal rate and regular rhythm.     Pulses: Normal pulses.     Heart sounds: Normal heart sounds.  Pulmonary:     Effort: Pulmonary effort is normal.     Breath sounds: Normal breath sounds.  Musculoskeletal:        General: Normal range of motion.  Cervical back: Normal range of motion and neck supple.  Neurological:     General: No focal deficit present.     Mental Status: He is alert and oriented to person, place, and time.  Psychiatric:        Mood and Affect: Mood normal.        Behavior: Behavior normal.     ASSESSMENT AND PLAN: 1. Chronic left shoulder pain (Primary) 2. Chronic pain of left knee - Keep all scheduled appointments with Orthopedics and Neurology.  - Referral to Physical Therapy for evaluation/management. - Ambulatory referral to Physical Therapy  3. Primary hypertension - Continue Labetalol as prescribed.  - Counseled on blood pressure goal of less than 130/80, low-sodium, DASH diet, medication compliance, and 150 minutes of moderate intensity exercise per week as tolerated. Counseled on medication adherence and adverse effects. - Keep all scheduled appointments with Cardiology.  - labetalol (NORMODYNE) 100 MG tablet; Take 1 tablet (100 mg total) by mouth 2 (two) times daily.  Dispense: 180 tablet; Refill: 0  4. Diabetes mellitus screening - Routine screening.  - POCT glycosylated hemoglobin (Hb A1C)    Patient was given the opportunity to ask questions.  Patient verbalized understanding of the plan and was able to repeat key elements of the plan. Patient was given clear instructions to go to Emergency Department or return to medical center if symptoms don't improve, worsen, or new problems develop.The patient verbalized understanding.   Orders Placed This  Encounter  Procedures   Ambulatory referral to Physical Therapy   POCT glycosylated hemoglobin (Hb A1C)     Requested Prescriptions   Signed Prescriptions Disp Refills   labetalol (NORMODYNE) 100 MG tablet 180 tablet 0    Sig: Take 1 tablet (100 mg total) by mouth 2 (two) times daily.   Follow-up with primary provider as scheduled.   Rema Fendt, NP

## 2023-03-02 ENCOUNTER — Encounter: Payer: Self-pay | Admitting: Neurology

## 2023-03-17 ENCOUNTER — Ambulatory Visit
Admission: RE | Admit: 2023-03-17 | Discharge: 2023-03-17 | Disposition: A | Discharge status: Home / Self Care | Payer: Medicaid Other | Source: Ambulatory Visit | Attending: Neurology | Admitting: Neurology

## 2023-03-17 DIAGNOSIS — I69391 Dysphagia following cerebral infarction: Secondary | ICD-10-CM

## 2023-03-17 DIAGNOSIS — R569 Unspecified convulsions: Secondary | ICD-10-CM

## 2023-03-17 DIAGNOSIS — I629 Nontraumatic intracranial hemorrhage, unspecified: Secondary | ICD-10-CM

## 2023-03-17 DIAGNOSIS — I6782 Cerebral ischemia: Secondary | ICD-10-CM | POA: Diagnosis not present

## 2023-03-17 DIAGNOSIS — G441 Vascular headache, not elsewhere classified: Secondary | ICD-10-CM | POA: Diagnosis not present

## 2023-03-17 DIAGNOSIS — I6203 Nontraumatic chronic subdural hemorrhage: Secondary | ICD-10-CM | POA: Diagnosis not present

## 2023-03-17 DIAGNOSIS — I69954 Hemiplegia and hemiparesis following unspecified cerebrovascular disease affecting left non-dominant side: Secondary | ICD-10-CM

## 2023-03-17 DIAGNOSIS — I10 Essential (primary) hypertension: Secondary | ICD-10-CM | POA: Diagnosis not present

## 2023-03-17 MED ORDER — GADOPICLENOL 0.5 MMOL/ML IV SOLN
9.0000 mL | Freq: Once | INTRAVENOUS | Status: AC | PRN
  Administered 2023-03-17: 9 mL via INTRAVENOUS

## 2023-03-23 ENCOUNTER — Telehealth: Payer: Self-pay | Admitting: Neurology

## 2023-03-23 NOTE — Telephone Encounter (Addendum)
 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  This was acute on the 06/10/2020 MRI. A couple additional chronic microhemorrhages are noted in the cerebellum and the pons, unchanged compared to the 2022 MRI Scattered T2/FLAIR hyperintense foci in the cerebral hemispheres and pons consistent with chronic microvascular ischemic change.  A couple small chronic lacunar infarctions are superimposed.  These findings appear stable compared to the previous MRI. Structures of the medial temporal lobes appear normal. No acute findings.   IMPRESSION: This MR angiogram of the neck with and without contrast shows the following: Minimal stenosis near the origin of the left internal carotid artery.  This is not hemodynamically significant and is common with age Other arteries appear normal.

## 2023-03-26 NOTE — Telephone Encounter (Signed)
 Call to patient to review results. No answer,left message to call back

## 2023-03-27 NOTE — Telephone Encounter (Signed)
Lmtrc. 2nd attempt.

## 2023-03-28 NOTE — Telephone Encounter (Signed)
 I was able to contact the patient's friend who answered his phone. He said the patient was unavailable and he would take a message. I requested a call back from the patient. Friend will relay.

## 2023-04-02 ENCOUNTER — Telehealth: Payer: Self-pay

## 2023-04-02 NOTE — Telephone Encounter (Signed)
 Patient came into office, I reviewed results with patient and stroke signs. He is in agreement to start Aspirin 81 mg daily per Dr. Terrace Arabia advice. He is having trouble with his phone.

## 2023-04-09 ENCOUNTER — Ambulatory Visit: Payer: Medicare Other | Admitting: Physician Assistant
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
## 2023-04-20 ENCOUNTER — Ambulatory Visit: Payer: Medicare Other | Admitting: Physician Assistant

## 2023-04-20 VITALS — BP 140/80 | HR 77 | Ht 69.0 in | Wt 196.0 lb

## 2023-04-20 DIAGNOSIS — G8194 Hemiplegia, unspecified affecting left nondominant side: Secondary | ICD-10-CM

## 2023-04-20 DIAGNOSIS — R195 Other fecal abnormalities: Secondary | ICD-10-CM

## 2023-04-20 DIAGNOSIS — R3989 Other symptoms and signs involving the genitourinary system: Secondary | ICD-10-CM | POA: Diagnosis not present

## 2023-04-20 DIAGNOSIS — K6289 Other specified diseases of anus and rectum: Secondary | ICD-10-CM

## 2023-04-20 DIAGNOSIS — R159 Full incontinence of feces: Secondary | ICD-10-CM

## 2023-04-20 DIAGNOSIS — R140 Abdominal distension (gaseous): Secondary | ICD-10-CM

## 2023-04-20 MED ORDER — METRONIDAZOLE 250 MG PO TABS: 250.0000 mg | ORAL_TABLET | Freq: Three times a day (TID) | ORAL | 0 refills | Status: AC

## 2023-04-20 NOTE — Patient Instructions (Addendum)
 Continue fiber 3 x a day Increase activity  Thank you for trusting me with your gastrointestinal care!   Quentin Mulling, PA-C  Small intestinal bacterial overgrowth (SIBO) occurs when there is an abnormal increase in the overall bacterial population in the small intestine -- particularly types of bacteria not commonly found in that part of the digestive tract. Small intestinal bacterial overgrowth (SIBO) commonly results when a circumstance -- such as surgery or disease -- slows the passage of food and waste products in the digestive tract, creating a breeding ground for bacteria.  Signs and symptoms of SIBO often include: Loss of appetite Abdominal pain Nausea Bloating An uncomfortable feeling of fullness after eating Diarrhea or constipation, depending on the type of gas produced  What foods trigger SIBO? While foods aren't the original cause of SIBO, certain foods do encourage the overgrowth of the wrong bacteria in your small intestine. If you're feeding them their favorite foods, they're going to grow more, and that will trigger more of your SIBO symptoms. By the same token, you can help reduce the overgrowth by starving the problematic bacteria of their favorite foods. This strategy has led to a number of proposed SIBO eating plans. The plans vary, and so do individual results. But in general, they tend to recommend limiting carbohydrates.  These include: Sugars and sweeteners. Fruits and starchy vegetables. Dairy products. Grains.  There is a test for this we can do called a breath test, if you are positive we will treat you with an antibiotic to see if it helps.  Your symptoms are very suspicious for this condition, as discussed, we will start you on an antibiotic to see if this helps.     FODMAP stands for fermentable oligo-, di-, mono-saccharides and polyols (1). These are the scientific terms used to classify groups of carbs that are difficult for our body to digest and  that are notorious for triggering digestive symptoms like bloating, gas, loose stools and stomach pain.   You can try low FODMAP diet  - start with eliminating just one column at a time that you feel may be a trigger for you. - the table at the very bottom contains foods that are low in FODMAPs   Sometimes trying to eliminate the FODMAP's from your diet is difficult or tricky, if you are stuggling with trying to do the elimination diet you can try an enzyme.  There is a food enzymes that you sprinkle in or on your food that helps break down the FODMAP. You can read more about the enzyme by going to this site: https://fodzyme.com/   Small intestinal bacterial overgrowth (SIBO) occurs when there is an abnormal increase in the overall bacterial population in the small intestine -- particularly types of bacteria not commonly found in that part of the digestive tract. Small intestinal bacterial overgrowth (SIBO) commonly results when a circumstance -- such as surgery or disease -- slows the passage of food and waste products in the digestive tract, creating a breeding ground for bacteria.  Signs and symptoms of SIBO often include: Loss of appetite Abdominal pain Nausea Bloating An uncomfortable feeling of fullness after eating Diarrhea or constipation, depending on the type of gas produced  What foods trigger SIBO? While foods aren't the original cause of SIBO, certain foods do encourage the overgrowth of the wrong bacteria in your small intestine. If you're feeding them their favorite foods, they're going to grow more, and that will trigger more of your SIBO symptoms. By the  same token, you can help reduce the overgrowth by starving the problematic bacteria of their favorite foods. This strategy has led to a number of proposed SIBO eating plans. The plans vary, and so do individual results. But in general, they tend to recommend limiting carbohydrates.  These include: Sugars and  sweeteners. Fruits and starchy vegetables. Dairy products. Grains.  There is a test for this we can do called a breath test, if you are positive we will treat you with an antibiotic to see if it helps.  Your symptoms are very suspicious for this condition, as discussed, we will start you on an antibiotic to see if this helps.

## 2023-04-20 NOTE — Progress Notes (Signed)
 Agree with assessment and plan as outlined.

## 2023-04-30 ENCOUNTER — Encounter
Payer: Medicare Other | Attending: Physical Medicine and Rehabilitation | Admitting: Physical Medicine and Rehabilitation

## 2023-04-30 VITALS — BP 134/77 | HR 76 | Ht 69.0 in | Wt 195.0 lb

## 2023-04-30 DIAGNOSIS — R972 Elevated prostate specific antigen [PSA]: Secondary | ICD-10-CM | POA: Diagnosis not present

## 2023-04-30 DIAGNOSIS — M25532 Pain in left wrist: Secondary | ICD-10-CM | POA: Diagnosis not present

## 2023-04-30 DIAGNOSIS — I639 Cerebral infarction, unspecified: Secondary | ICD-10-CM | POA: Insufficient documentation

## 2023-04-30 DIAGNOSIS — E782 Mixed hyperlipidemia: Secondary | ICD-10-CM | POA: Diagnosis not present

## 2023-04-30 DIAGNOSIS — Z638 Other specified problems related to primary support group: Secondary | ICD-10-CM | POA: Insufficient documentation

## 2023-04-30 MED ORDER — GABAPENTIN 300 MG PO CAPS: 300.0000 mg | ORAL_CAPSULE | Freq: Three times a day (TID) | ORAL | 3 refills | Status: DC

## 2023-04-30 MED ORDER — ATORVASTATIN CALCIUM 80 MG PO TABS
80.0000 mg | ORAL_TABLET | Freq: Every day | ORAL | 1 refills | Status: DC
Start: 2023-04-30 — End: 2023-08-06

## 2023-04-30 NOTE — Patient Instructions (Signed)
 [facility] 4-5 2nd Thursday of every month

## 2023-04-30 NOTE — Progress Notes (Signed)
 Subjective:    Patient ID: [name], male    DOB: 07/17/1965, 58 y.o.   MRN: [id number]  HPI [name] is a 58 year old man who presents for follow-up of CVA  1) Overweight: -has lost a lot of weight! -he is scared to eat his wife's food  -discussed that weight is down to 192  lbs -tries to do 10 laps back and forth  2) Problems with spouse -his divorce is going to be finalized next week -he got accepted in a two bedroom -under adult protective services now -she has been hacking into his phone.  -his wife has a new boyfriend and he thinks this has influenced her -his ex forged his signature with the IRS  -discussed that his wife came looking for him at the [place] [facility] and his [facility] is supportive of him.  -his wife hacked his phone  3) CVA -he is doing well -he asks for refill of gabapentin for his neuropathy -he is walking -using the cane -nerve pain has been stable  4) HTN: -has been off BP meds since 12/19  5) Neuropathy: -needs refill of gabapentin -was told that his nerves are good.   6) Left shoulder pain: -has been following with orthopedic and has been treated with steroid injections  7) Right knee pain -felt a pop when he was going to the dining hall at the [place] center -has lost power when he is trying to go up steps -they told him in therapy that they may have worn out his right side.  -still hurting -voltaren gel is not helping, he is only using at night  8) Left wrist pain: -he would like to see ortho for a new brace    Pain Inventory Average Pain 7 Pain Right Now 7 My pain is intermittent, sharp, and tingling  LOCATION OF PAIN  Shoulder, Hand, Fingers, Leg, Toes  BOWEL Number of stools per week: 7   BLADDER Normal Flow not steady    Mobility use a cane how many minutes can you walk? 30 ability to climb steps?  yes do you drive?  yes Do you have any goals in this area?  yes  Function disabled: date disabled . I need assistance  with the following:  meal prep and household duties  Neuro/Psych numbness tingling  Prior Studies Any changes since last visit?  no  Physicians involved in your care Any changes since last visit?  no   Family History  Problem Relation Age of Onset   Hypertension Mother        Passed away when she is 52 secondary to hip surgery complications   CAD Mother    Heart attack Mother    Hypertension Father    Diabetes Brother    Stroke Brother    Heart attack Maternal Grandmother    Colon cancer Neg Hx    Esophageal cancer Neg Hx    Pancreatic cancer Neg Hx    Stomach cancer Neg Hx    Colon polyps Neg Hx    Social History   Socioeconomic History   Marital status: Married    Spouse name: Not on file   Number of children: Not on file   Years of education: Not on file   Highest education level: Not on file  Occupational History   Not on file  Tobacco Use   Smoking status: Former    Types: Cigarettes    Passive exposure: Past   Smokeless tobacco: Never  Vaping Use   Vaping status: Never Used  Substance and Sexual Activity   Alcohol use: Not Currently   Drug use: Not Currently    Types: Cocaine, Marijuana, Oxycodone   Sexual activity: Yes  Other Topics Concern   Not on file  Social History Narrative   Not on file   Social Drivers of Health   Financial Resource Strain: Not on file  Food Insecurity: Not on file  Transportation Needs: Not on file  Physical Activity: Not on file  Stress: Not on file  Social Connections: Unknown (06/07/2021)   Received [facility]   Social Network    Social Network: Not on file   Past Surgical History:  Procedure Laterality Date   COLONOSCOPY WITH PROPOFOL N/A 12/09/2020   Procedure: COLONOSCOPY WITH PROPOFOL;  Surgeon: Benancio Deeds, MD;  Location: [facility];  Service: Gastroenterology;  Laterality: N/A;   KNEE ARTHROSCOPY Left 04/13/2016   Procedure: ARTHROSCOPY KNEE WITH DEBRIDEMENT;  Surgeon:  Valeria Batman, MD;  Location: [facility];  Service: Orthopedics;  Laterality: Left;   KNEE ARTHROSCOPY WITH MEDIAL MENISECTOMY Right 04/13/2016   Procedure: KNEE ARTHROSCOPY WITH MEDIAL MENISECTOMY;  Surgeon: Valeria Batman, MD;  Location: [facility];  Service: Orthopedics;  Laterality: LEFT not right knee   NASAL FRACTURE SURGERY     SHOULDER SURGERY Left    Past Medical History:  Diagnosis Date   Anxiety    Cataract    removed left eye   Chronic combined systolic and diastolic CHF (congestive heart failure) (HCC)    EF 25-30%   Diastolic dysfunction    Dyslipidemia    Hyperlipidemia    Hypertension    Neuromuscular disorder (HCC)    neuropathy left foot   Nonischemic dilated cardiomyopathy (HCC)    felt secondary to HTN with no ischemia on nuclear stress test 05/2012, EF 25-30%   Seizures (HCC)    last seizure 05-2020- on Keppra   Shortness of breath    Stroke (HCC) 05/2020   BP 134/77   Pulse 76   Ht 5\' 9"  (1.753 m)   Wt 195 lb (88.5 kg)   SpO2 96%   BMI 28.80 kg/m   Opioid Risk Score:   Fall Risk Score:  `1  Depression screen PHQ 2/9     01/29/2023    1:24 PM 10/24/2022    1:18 PM 08/07/2022   11:12 AM 03/10/2022   10:40 AM 02/20/2022    1:40 PM 11/22/2021   10:27 AM 09/14/2021    2:14 PM  Depression screen PHQ 2/9  Decreased Interest 0 0 0 0 0 0 0  Down, Depressed, Hopeless 0 0 0 0 1 0 0  PHQ - 2 Score 0 0 0 0 1 0 0  Altered sleeping     0 0   Tired, decreased energy     0 0   Change in appetite     1 0   Feeling bad or failure about yourself      0 0   Trouble concentrating     0 0   Moving slowly or fidgety/restless     0 0   Suicidal thoughts     0 0   PHQ-9 Score     2 0   Difficult doing work/chores     Somewhat difficult Not difficult at all     Gen: no distress, normal appearing HEENT: oral mucosa pink and moist, NCAT Cardio: Reg  rate Chest: normal effort, normal rate of breathing Abd: soft, non-distended Ext:  no edema Psych: pleasant, normal affect Skin: intact MSK: Left hand and foot weakness. Ambulating with cane Right knee TTP, left wrist brace     Family History  Problem Relation Age of Onset   Hypertension Mother        Passed away when she is 59 secondary to hip surgery complications   CAD Mother    Heart attack Mother    Hypertension Father    Diabetes Brother    Stroke Brother    Heart attack Maternal Grandmother    Colon cancer Neg Hx    Esophageal cancer Neg Hx    Pancreatic cancer Neg Hx    Stomach cancer Neg Hx    Colon polyps Neg Hx    Social History   Socioeconomic History   Marital status: Married    Spouse name: Not on file   Number of children: Not on file   Years of education: Not on file   Highest education level: Not on file  Occupational History   Not on file  Tobacco Use   Smoking status: Former    Types: Cigarettes    Passive exposure: Past   Smokeless tobacco: Never  Vaping Use   Vaping status: Never Used  Substance and Sexual Activity   Alcohol use: Not Currently   Drug use: Not Currently    Types: Cocaine, Marijuana, Oxycodone   Sexual activity: Yes  Other Topics Concern   Not on file  Social History Narrative   Not on file   Social Drivers of Health   Financial Resource Strain: Not on file  Food Insecurity: Not on file  Transportation Needs: Not on file  Physical Activity: Not on file  Stress: Not on file  Social Connections: Unknown (06/07/2021)   Received from [facility], [facility]   Social Network    Social Network: Not on file   Past Surgical History:  Procedure Laterality Date   COLONOSCOPY WITH PROPOFOL N/A 12/09/2020   Procedure: COLONOSCOPY WITH PROPOFOL;  Surgeon: Benancio Deeds, MD;  Location: [facility];  Service: Gastroenterology;  Laterality: N/A;   KNEE ARTHROSCOPY Left 04/13/2016   Procedure: ARTHROSCOPY KNEE WITH DEBRIDEMENT;  Surgeon: Valeria Batman, MD;  Location: [facility];   Service: Orthopedics;  Laterality: Left;   KNEE ARTHROSCOPY WITH MEDIAL MENISECTOMY Right 04/13/2016   Procedure: KNEE ARTHROSCOPY WITH MEDIAL MENISECTOMY;  Surgeon: Valeria Batman, MD;  Location: [facility];  Service: Orthopedics;  Laterality: LEFT not right knee   NASAL FRACTURE SURGERY     SHOULDER SURGERY Left    Past Medical History:  Diagnosis Date   Anxiety    Cataract    removed left eye   Chronic combined systolic and diastolic CHF (congestive heart failure) (HCC)    EF 25-30%   Diastolic dysfunction    Dyslipidemia    Hyperlipidemia    Hypertension    Neuromuscular disorder (HCC)    neuropathy left foot   Nonischemic dilated cardiomyopathy (HCC)    felt secondary to HTN with no ischemia on nuclear stress test 05/2012, EF 25-30%   Seizures (HCC)    last seizure 05-2020- on Keppra   Shortness of breath    Stroke (HCC) 05/2020   There were no vitals taken for this visit.  Opioid Risk Score:   Fall Risk Score:  `1  Depression screen [facility] 2/9     01/29/2023  1:24 PM 10/24/2022    1:18 PM 08/07/2022   11:12 AM 03/10/2022   10:40 AM 02/20/2022    1:40 PM 11/22/2021   10:27 AM 09/14/2021    2:14 PM  Depression screen PHQ 2/9  Decreased Interest 0 0 0 0 0 0 0  Down, Depressed, Hopeless 0 0 0 0 1 0 0  PHQ - 2 Score 0 0 0 0 1 0 0  Altered sleeping     0 0   Tired, decreased energy     0 0   Change in appetite     1 0   Feeling bad or failure about yourself      0 0   Trouble concentrating     0 0   Moving slowly or fidgety/restless     0 0   Suicidal thoughts     0 0   PHQ-9 Score     2 0   Difficult doing work/chores     Somewhat difficult Not difficult at all     Review of Systems  Musculoskeletal:        Pain in Shoulder, Hand, Fingers, Leg, Toes  All other systems reviewed and are negative.      Assessment & Plan:   Mr. Ratajczak is a 58 year old man presenting today for f/u of left hemiplegia, right sided low back pain, and left sided foot  pain following stroke, left shoulder pain.  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  -refilled atorvastatin -continue daily exercise at [facility] -refilled atorvastatin for mixed HLD -discussed that he is going to the gym, continue going 6 days per week.  -discussed that he has a good social community at [employer], the gym, and the [place] -referred to PCP -referred to PT --progress to quad walker -face to face for AFO performed -discussed that most motor improvement occurs in the first 4 weeks, but improvement is still seen in the first 6 months, and some improvement may still be seen in the first 2 weeks -evaluated for vagal nerve stimulation trial for unfortunately will not qualify due to his stroke being hemorrhagic -will send him information about deep brain stimulation, discussed with him that I have seen this help one of my patients -Incredible improvement in ambulation!  -discussed that he is doing 10 laps daily and feeling great -refilled gabapentin -graduated from therapy -Neuropathy is moving up to neck.  -was released from the hospital one year today -she is doing exercises.  -left hand is getting on his nerves -he thinks -his therapists are planning to take him to the pool and told him this may help with his arm pain -pain has been severe at home, as it had been in hospital -his wife purchased 500mg  tablets of tylenol -he is worried about his decreased upper extremity strength and asks if this will ever improve -he is not experiencing much shoulder pain right now -he does have some strength -the benefits from the trigger point injections lwore off within 1 week -pain is severely limiting his function -he is taking Tizanidine and it does not help, does make him sleepy, but his wife is trying to encourage him to stay up during the day -he developed the foot pain a couple of weeks ago -he was wearing a boot at that time so took it off but has still had  pain a couple of weeks afterward. -he has not tried anything for this pain -it is worst at night  and feels like burning and tingling at his heel. -he has been doing great with therapy and is walking in his home! -his wife asks if he could get a handicap placard.  -the foot is still feeling numb with pins and needles, also painful. He is unsure whether Gabapentin helps- so far nothing he as tried has helped his neuropathy. His balance is also now off and he has been stumbling more. He actually felt that the Gabapentin made his pain worse. He has never tried Cymbalta but is willing to try it.  -Has been able to use bathroom himself! -he is working with therapy right now.  -he was feeling wobbly over the weekend so had to miss his PT session -shoulder is feeling very numb and cannot make a fist -ready to proceed with Qutenza today -His wife asks for refill of Eucerin and Diclofenac and Tizanidine.  -She asks whether he should still be taking Seroquel -he called medical records and records were faxed on December 28th.  -He called our office today  -He needs medical records to process his disability claims -able to walk much better.  -his wife mentioned to me that she is still worried about him driving. He has been driving around locally to pharmacy   2) Lower extremity edema -worsening -not painful -he asks when his next appointment with me is.  -has gotten used to it -therapist was concerned -painful -sometimes feels cold on the left foot -never took the fluid pills -he is hoping to walk more now that the weather has improved D/c amlodipine  3) Headaches -started last week and felt loss of balance -this stopped him from doing therapy.  -still having tension in head.  -he takes topamax 25mg  HS.   4) Obesity -he has lost 64 lbs! -discussed that current weight is 186 lbs  5) Left shoulder pain -intermittent -sometimes he uses voltaren gel and this seems to help the  most -discussed that he has been following with ortho and benefitting from injections -discussed that he has 2 hearings coming up regarding his shoulder.   6) Left upper extremity neuropathy -started last month -received steroid injection 2 months ago. Asks if this is a side effect of the steroid injection -asks about a supplement and if this is ok to take -he asks if there is anything else he can do for this -refilled gabapentin  7) Cervical myofascial pain syndrome -he wants to know if injections could help  8) Neuropathy is getting worse -he gets depressed about this.  -refilled gabapentin 300mg  TID  9) Urinary frequency: -notes that this started after taking the Baclofen, but his wife said she never gave it to him -he did take the cymbalta but stopped due to this potential side effect -he is urinating every 5-10 minutes -he is willing to get UA and UC drawn today.  Refilled flomax  10) Problems with wife -discussed that his divorce will be finalized next week -he believes his wife is trying to tell his healthcare providers he has dementia -he also feels that one day his cane was filled with water to make it more difficult for him to use and that she did this, he says this was witnessed by his therapy team -his family is unaware of this as they have moved and he has not yet discussed with them -he would like his medical records to be restricted from his wife.  -he asks if I can help him find alternative housing -discussed that the [place]  is giving him good support -discussed that his wife is tracking to hack his phone.  -discussed that his wife was falsely saying that she had dementia. -discussed that he ran into her at the bank.  -discussed that step daughter teamed up with her    59) HTN: -d/c amlodipine --refilled losartan and tamsulosin -d/c tizanidine -Advised checking BP daily at home and logging results to bring into follow-up appointment with PCP and  myself. -Reviewed BP meds today.  -Advised regarding healthy foods that can help lower blood pressure and provided with a list: 1) citrus foods- high in vitamins and minerals 2) salmon and other fatty fish - reduces inflammation and oxylipins 3) swiss chard (leafy green)- high level of nitrates 4) pumpkin seeds- one of the best natural sources of magnesium 5) Beans and lentils- high in fiber, magnesium, and potassium 6) Berries- high in flavonoids 7) Amaranth (whole grain, can be cooked similarly to rice and oats)- high in magnesium and fiber 8) Pistachios- even more effective at reducing BP than other nuts 9) Carrots- high in phenolic compounds that relax blood vessels and reduce inflammation 10) Celery- contain phthalides that relax tissues of arterial walls 11) Tomatoes- can also improve cholesterol and reduce risk of heart disease 12) Broccoli- good source of magnesium, calcium, and potassium 13) Greek yogurt: high in potassium and calcium 14) Herbs and spices: Celery seed, cilantro, saffron, lemongrass, black cumin, ginseng, cinnamon, cardamom, sweet basil, and ginger 15) Chia and flax seeds- also help to lower cholesterol and blood sugar 16) Beets- high levels of nitrates that relax blood vessels  17) spinach and bananas- high in potassium  -Provided lise of supplements that can help with hypertension:  1) magnesium: one high quality brand is Bioptemizers since it contains all 7 types of magnesium, otherwise over the counter magnesium gluconate 400mg  is a good option 2) B vitamins 3) vitamin D 4) potassium 5) CoQ10 6) L-arginine 7) Vitamin C 8) Beetroot -Educated that goal BP is 120/80. -Made goal to incorporate some of the above foods into diet.    12) Left hand weakness: -ordered OT, continue HEP  13) Tongue swelling:  -d/c tizanidine  14) Right knee pain:  -XR ordered, discussed spurring -recommended neoprene knee sleeve  15) Left wrist pain: -continue  brace -referred to Dr. Fara Boros  16. Elevated PSA: -discussed that he had a biopsy and the results are pending -discussed stress reduction, meditation, avoiding added sugars, eating fruits and vegetables

## 2023-05-09 DIAGNOSIS — M19012 Primary osteoarthritis, left shoulder: Secondary | ICD-10-CM | POA: Diagnosis not present

## 2023-05-28 ENCOUNTER — Ambulatory Visit: Payer: Medicare Other | Admitting: Cardiology

## 2023-05-28 ENCOUNTER — Encounter: Payer: Self-pay | Admitting: Cardiovascular Disease

## 2023-05-28 ENCOUNTER — Ambulatory Visit: Payer: Self-pay | Attending: Cardiology | Admitting: Cardiology

## 2023-05-28 ENCOUNTER — Ambulatory Visit: Attending: Cardiovascular Disease | Admitting: Cardiovascular Disease

## 2023-05-28 VITALS — BP 120/78 | HR 80 | Ht 69.5 in | Wt 193.8 lb

## 2023-05-28 DIAGNOSIS — I517 Cardiomegaly: Secondary | ICD-10-CM

## 2023-05-28 DIAGNOSIS — I700 Atherosclerosis of aorta: Secondary | ICD-10-CM | POA: Diagnosis not present

## 2023-05-28 DIAGNOSIS — I5032 Chronic diastolic (congestive) heart failure: Secondary | ICD-10-CM | POA: Diagnosis not present

## 2023-05-28 DIAGNOSIS — I69359 Hemiplegia and hemiparesis following cerebral infarction affecting unspecified side: Secondary | ICD-10-CM

## 2023-05-28 DIAGNOSIS — I10 Essential (primary) hypertension: Secondary | ICD-10-CM | POA: Diagnosis not present

## 2023-05-28 NOTE — Patient Instructions (Signed)
 Medication Instructions:  No changes *If you need a refill on your cardiac medications before your next appointment, please call your pharmacy*  Follow-Up: At [facility], you and your health needs are our priority.  As part of our continuing mission to provide you with exceptional heart care, our providers are all part of one team.  This team includes your primary Cardiologist (physician) and Advanced Practice Providers or APPs (Physician Assistants and Nurse Practitioners) who all work together to provide you with the care you need, when you need it.  Your next appointment:   1 year(s)  Provider:   Dr Alvis Ba  We recommend signing up for the patient portal called "MyChart".  Sign up information is provided on this After Visit Summary.  MyChart is used to connect with patients for Virtual Visits (Telemedicine).  Patients are able to view lab/test results, encounter notes, upcoming appointments, etc.  Non-urgent messages can be sent to your provider as well.   To learn more about what you can do with MyChart, go to [email or web address].

## 2023-05-28 NOTE — Progress Notes (Signed)
 Cardiology Office Note:  .   Date:  05/28/2023  ID:  [name], DOB 1965-06-29, MRN [id number] PCP: Senaida Dama, NP  [facility] Providers Cardiologist:  Ahmad Alert, MD    History of Present Illness: .   [name] is a 58 y.o. male with a history of remote stroke (2022 left posterior thalamus hemorrhage), chronic diastolic heart failure due to left ventricular hypertrophy, hypertension, hypercholesterolemia, returns in follow-up.  The patient specifically denies any chest pain at rest or with exertion, dyspnea at rest or with exertion, orthopnea, paroxysmal nocturnal dyspnea, syncope, palpitations, focal neurological deficits, intermittent claudication, lower extremity edema, unexplained weight gain, cough, hemoptysis or wheezing.  He exercises at the gym 6 days a week.  He is working up to performing 10 laps.  He has some limitations related to use of his left arm, paretic since his stroke and further injured after a fall recently.  When initial presented in 2014 he had severely depressed LVEF at 25-30% with mild LVH and with grade 2 diastolic dysfunction.  By 2016 LVEF had normalized.  His most recent echocardiogram from 08/29/2022 shows LVEF 55-60%, normal global longitudinal LV strain, normal diastolic function, does not describe left ventricular hypertrophy as was seen on previous studies (septum and posterior wall measurement at 1.0 cm or probably underestimated).  No significant valvular abnormalities.  The echocardiogram from 06/10/2020 described "severe left ventricular hypertrophy", but the septum and posterior wall both measured 1.3 cm.  I reviewed the most recent echocardiogram.  He does have mild LVH and there is impaired relaxation with mildly decreased annular diastolic velocities, but the EF is completely normal.  He had a normal nuclear stress test in 2016.  Over the years his ECGs have generally shown evidence of left ventricular hypertrophy with variable  degree of secondary repolarization abnormalities, always in normal sinus rhythm.  CT of the chest without contrast in 2022 did show some atherosclerotic plaque in the normal caliber aorta  He is an Higher education careers adviser.  He is separated from his wife ever since his stroke.  He has been unsheltered, but is now receiving a home voucher from the [place].  Studies Reviewed: Aaron Aas   EKG Interpretation Date/Time:  Monday May 28 2023 13:15:12 EDT Ventricular Rate:  80 PR Interval:  174 QRS Duration:  76 QT Interval:  354 QTC Calculation: 408 R Axis:   -4  Text Interpretation: Normal sinus rhythm Minimal voltage criteria for LVH, may be normal variant ( R in aVL ) Nonspecific T wave abnormality When compared with ECG of 05-Dec-2022 15:20, No significant change was found Confirmed by Dontee Jaso [phone]) on 05/28/2023 1:20:26 PM    MRI/MRA of the brain and neck 03/18/2023 CT of the chest without contrast 2022 Echocardiograms 08/29/2022, 06/10/2020 and 05/27/2012.  Lipid Panel     Component Value Date/Time   CHOL 134 11/09/2022 0834   TRIG 47 11/09/2022 0834   HDL 62 11/09/2022 0834   CHOLHDL 2.2 11/09/2022 0834   CHOLHDL 3.8 06/09/2020 1809   VLDL 66 (H) 06/09/2020 1809   LDLCALC 61 11/09/2022 0834   LABVLDL 11 11/09/2022 0834      Latest Ref Rng & Units 12/27/2022   12:00 AM 11/23/2021   11:33 AM 05/11/2021    4:52 PM  BMP  Glucose 70 - 99 mg/dL 73  77  75   BUN 6 - 24 mg/dL 14  14  16    Creatinine 0.76 - 1.27 mg/dL 1.91  4.78  1.10   BUN/Creat Ratio 9 - 20 13  13  15    Sodium 134 - 144 mmol/L 141  144  144   Potassium 3.5 - 5.2 mmol/L 4.1  4.0  3.9   Chloride 96 - 106 mmol/L 104  106  111   CO2 20 - 29 mmol/L 22  24  19    Calcium  8.7 - 10.2 mg/dL 40.9  81.1  9.7     Risk Assessment/Calculations:             Physical Exam:   VS:  BP 120/78 (BP Location: Left Arm, Patient Position: Sitting, Cuff Size: Normal)   Pulse 80   Ht 5' 9.5" (1.765 m)   Wt 87.9 kg   SpO2 96%   BMI 28.21 kg/m     Wt Readings from Last 3 Encounters:  05/28/23 87.9 kg  04/30/23 88.5 kg  04/20/23 88.9 kg    GEN: Well nourished, well developed in no acute distress NECK: No JVD; No carotid bruits CARDIAC: RRR, no murmurs, rubs, gallops RESPIRATORY:  Clear to auscultation without rales, wheezing or rhonchi  ABDOMEN: Soft, non-tender, non-distended EXTREMITIES:  No edema; No deformity   ASSESSMENT AND PLAN: .   CHF: Evolution of his cardiomyopathy strongly suggests initial systolic dysfunction due to poorly treated hypertension, with complete recovery of LV systolic function, some residual LVH and diastolic dysfunction.  Currently euvolemic on low-dose of loop diuretic and NYHA functional class I.  Continue angiotensin receptor blocker, amlodipine  and labetalol . HTN: In optimal range on current medications.  Normal renal function. History of hemorrhagic stroke: With some residual left hemiparesis. HLP: All lipid parameters in target range on the current dose of atorvastatin , continue. Aortic atherosclerosis: Normal aortic caliber.  On aspirin  81 mg daily.       Dispo: Continue current medications.  Follow-up in 1 year.  Signed, Luana Rumple, MD

## 2023-05-29 ENCOUNTER — Ambulatory Visit (INDEPENDENT_AMBULATORY_CARE_PROVIDER_SITE_OTHER): Admitting: Orthopedic Surgery

## 2023-05-29 DIAGNOSIS — R29898 Other symptoms and signs involving the musculoskeletal system: Secondary | ICD-10-CM | POA: Diagnosis not present

## 2023-05-29 NOTE — Progress Notes (Signed)
 [name] - 58 y.o. male MRN [id number]  Date of birth: November 21, 1965  Office Visit Note: Visit Date: 05/29/2023 PCP: Dalton Dama, NP Referred by: Dalton Redhead, MD  Subjective: No chief complaint on file.  HPI: [name] is a pleasant 58 y.o. male who returns today for ongoing dysfunction of the left upper extremity after stroke. He continues reporting deficits to both the left upper extremity and left lower extremity.  His left upper extremity elbow remains slightly flexed.  At his prior visit, he was placed into a wrist brace for developing wrist drop, has been doing some therapeutic exercises when outside of the brace and feels that his wrist extension has improved somewhat, still has well-maintained thumb extension.  He has a complex history of stroke multiple years prior with primarily left-sided deficits.  He is being seen ongoing by neurology.   Pertinent ROS were reviewed with the patient and found to be negative unless otherwise specified above in HPI.     Assessment & Plan: Visit Diagnoses:  1. Weakness of left upper extremity     Plan: Based on his clinical examination today, there remains concern for a more centralized source of nerve pathology.  From a peripheral nerve standpoint, we reviewed his recent electrodiagnostic study of the left upper extremity which does not show any significant nerve deficits.  We did discuss etiology and pathophysiology of centralized pathology within the left upper extremity.  There may be an element of recovery that he can regain with time.  In the future, he would also be a reasonable candidate for discussion regarding tendon transfers in order to help with wrist drop, however if there is an underlying centralized component, this may preclude our ability to utilize other structures within the upper extremity.  For the time being, given his ongoing improvement, he would like to continue with wrist bracing.  I recommended  that he be seen once again by occupational therapy for fabrication of an orthosis.  At that juncture, he can continue with home exercises particularly for wrist extension and digital extension.  He can return to me as needed moving forward should he develop worsening issues or progressive weakness with worsening wrist drop.   Follow-up: No follow-ups on file.   Meds & Orders: No orders of the defined types were placed in this encounter.   Orders Placed This Encounter  Procedures   Ambulatory referral to Occupational Therapy     Procedures: No procedures performed      Clinical History: No specialty comments available.  He reports that he has quit smoking. His smoking use included cigarettes. He has been exposed to tobacco smoke. He has never used smokeless tobacco.  Recent Labs    11/24/22 1236 02/26/23 1136  HGBA1C 5.0 4.9    Objective:   Vital Signs: There were no vitals taken for this visit.  Physical Exam  Gen: Well-appearing, in no acute distress; non-toxic CV: Regular Rate. Well-perfused. Warm.  Resp: Breathing unlabored on room air; no wheezing. Psych: Fluid speech in conversation; appropriate affect; normal thought process  Ortho Exam  PHYSICAL EXAM:  General: Patient with notable prior left-sided stroke deficits upper and lower extremity  Range of Motion and Palpation Tests: Left elbow held in slightly flexed position approximately 20 degrees, slightly passively correctable - Difficulty with elbow extension secondary to weakness, able to fire triceps - Elbow flexion is intact  Left wrist with mild wrist drop and associated digital drop as well -  Able to perform index extension, able to perform thumb retropulsion - Wrist with extension to approximately neutral, remains in radial deviated position   Neurologic, Vascular, Motor: Sensation is severely limited to light touch in radial distribution left upper extremity.  Also diminished to light touch in the  median/ulnar distributions as well.    Fingers pink and well perfused.  Capillary refill is brisk.      Lab Results  Component Value Date   HGBA1C 4.9 02/26/2023     Imaging: No results found.  Past Medical/Family/Surgical/Social History: Medications & Allergies reviewed per EMR, new medications updated. 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) 10/24/2022   Chronic diastolic CHF (congestive heart failure) (HCC) 11/07/2021   Left ventricular hypertrophy 11/07/2021   Osteoarthritis of left glenohumeral joint 05/04/2021   Colon cancer screening    Left hemiplegia (HCC) 07/02/2020   Cognitive deficit due to recent stroke 07/02/2020   Dysphagia, post-stroke    Vascular headache    Acute blood loss anemia    AKI (acute kidney injury) (HCC)    Essential hypertension    Partial seizure (HCC)    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   Intracranial hemorrhage (HCC) 06/09/2020   Sprain of anterior talofibular ligament of left ankle 01/15/2020   Hyperlipidemia, on Lipitor 04/01/2017   Alcohol  use disorder, mild, abuse 06/30/2016   Morbid obesity (HCC) 06/30/2016   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   Chronic combined systolic and diastolic CHF (congestive heart failure) (HCC) 11/08/2012   HTN (hypertension) 05/25/2012   Past Medical History:  Diagnosis Date   Anxiety    Cataract    removed left eye   Chronic combined systolic and diastolic CHF (congestive heart failure) (HCC)    EF 25-30%   Diastolic dysfunction    Dyslipidemia    Hyperlipidemia    Hypertension    Neuromuscular disorder (HCC)    neuropathy left foot   Nonischemic dilated cardiomyopathy (HCC)    felt secondary to HTN with no ischemia on nuclear stress test 05/2012, EF 25-30%   Seizures (HCC)    last seizure 05-2020- on Keppra    Shortness of breath     Stroke (HCC) 05/2020   Family History  Problem Relation Age of Onset   Hypertension Mother        Passed away when she is 65 secondary to hip surgery complications   CAD Mother    Heart attack Mother    Hypertension Father    Diabetes Brother    Stroke Brother    Heart attack Maternal Grandmother    Colon cancer Neg Hx    Esophageal cancer Neg Hx    Pancreatic cancer Neg Hx    Stomach cancer Neg Hx    Colon polyps Neg Hx    Past Surgical History:  Procedure Laterality Date   COLONOSCOPY WITH PROPOFOL  N/A 12/09/2020   Procedure: COLONOSCOPY WITH PROPOFOL ;  Surgeon: Ace Holder, MD;  Location: [facility];  Service: Gastroenterology;  Laterality: N/A;   KNEE ARTHROSCOPY Left 04/13/2016   Procedure: ARTHROSCOPY KNEE WITH DEBRIDEMENT;  Surgeon: Shirlee Dotter, MD;  Location: [facility];  Service: Orthopedics;  Laterality: Left;   KNEE ARTHROSCOPY WITH MEDIAL MENISECTOMY Right 04/13/2016   Procedure: KNEE ARTHROSCOPY WITH MEDIAL MENISECTOMY;  Surgeon: Shirlee Dotter, MD;  Location: [facility];  Service: Orthopedics;  Laterality: LEFT  not right knee   NASAL FRACTURE SURGERY     SHOULDER SURGERY Left    Social History   Occupational History   Not on file  Tobacco Use   Smoking status: Former    Types: Cigarettes    Passive exposure: Past   Smokeless tobacco: Never  Vaping Use   Vaping status: Never Used  Substance and Sexual Activity   Alcohol  use: Not Currently   Drug use: Not Currently    Types: Cocaine, Marijuana, Oxycodone    Sexual activity: Yes    Lumi Winslett Alvia Jointer, M.D. [facility]

## 2023-06-01 DIAGNOSIS — C61 Malignant neoplasm of prostate: Secondary | ICD-10-CM | POA: Diagnosis not present

## 2023-06-01 DIAGNOSIS — N4289 Other specified disorders of prostate: Secondary | ICD-10-CM | POA: Diagnosis not present

## 2023-06-11 ENCOUNTER — Telehealth: Payer: Self-pay | Admitting: Adult Health

## 2023-06-11 ENCOUNTER — Ambulatory Visit: Payer: Medicaid Other | Admitting: Adult Health

## 2023-06-11 ENCOUNTER — Encounter: Payer: Self-pay | Admitting: Adult Health

## 2023-06-11 VITALS — BP 125/74 | HR 69 | Ht 69.5 in | Wt 198.0 lb

## 2023-06-11 DIAGNOSIS — G8194 Hemiplegia, unspecified affecting left nondominant side: Secondary | ICD-10-CM | POA: Diagnosis not present

## 2023-06-11 DIAGNOSIS — G8929 Other chronic pain: Secondary | ICD-10-CM

## 2023-06-11 DIAGNOSIS — I619 Nontraumatic intracerebral hemorrhage, unspecified: Secondary | ICD-10-CM

## 2023-06-11 DIAGNOSIS — M25512 Pain in left shoulder: Secondary | ICD-10-CM | POA: Diagnosis not present

## 2023-06-11 MED ORDER — GABAPENTIN 300 MG PO CAPS: ORAL_CAPSULE | ORAL | 3 refills | Status: DC

## 2023-06-11 NOTE — Telephone Encounter (Signed)
 Referral for orthopedics fax to [facility]. Phone: [phone], Fax: [phone]

## 2023-06-11 NOTE — Patient Instructions (Addendum)
 Your Plan:  Please contact [facility] to complete MRI cervical spine - office number is [phone]  Adjust gabapentin  dosage - continue 300mg  (1 cap) in the morning and afternoon, increase night time dose to 600mg  (2 caps)  Referral placed to orthopedics for second opinion regarding chronic left shoulder pain  Continue to stay active with routine physical activity and exercise - keep up the good work!   Continue aspirin  81mg  daily for stroke prevention      Follow up in 7 months or call earlier if needed     Thank you for coming to see us  at [facility]. I hope we have been able to provide you high quality care today.  You may receive a patient satisfaction survey over the next few weeks. We would appreciate your feedback and comments so that we may continue to improve ourselves and the health of our patients.

## 2023-06-11 NOTE — Progress Notes (Signed)
 Chief Complaint  Patient presents with   Follow-up    Pt alone, rm 8. Since having the NCV and EMG he has been noticing a tingling sensation in LUE/LLE feels persistent. He is seeing ortho care for carpal tunnel and shoulder. Walking with walker.       ASSESSMENT AND PLAN  [name] is a 58 y.o. 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 Encouraged contacting GI to schedule MRI c-spine. EMG/NCV no focal neuropathy.  Continue routine f/u with PMR  No seizure activity, remains off Keppra   Continue aspirin  81 mg daily and atorvastatin  80 mg daily for secondary stroke prevention manner/prescribed by PCP  Continue close PCP follow-up for aggressive stroke risk factor management  Chronic left shoulder pain  Per request, referral placed to ortho for 2nd opinion regarding need of shoulder replacement. Discussed likely difficult recovery if this was pursued due to left arm deficits which he understands but would still like to pursue 2nd opinion  Chronic migraine headache  Stable  Continue topiramate  100 mg twice daily    Follow-up in 7 months or call earlier if needed     DIAGNOSTIC DATA (LABS, IMAGING, TESTING) - I reviewed patient records, labs, notes, testing and imaging myself where available.   MEDICAL HISTORY:   Update 06/11/2023 JM: Patient returns for follow-up visit. Reports since his EMG/NCV back in Nov, he has been having worsening left upper and lower extremity tingling and pain, reports he was told at that time of testing could have some increased pain for a couple days but he is concerned that worsening symptoms are still present. He remains on gabapentin  300 mg 3 times daily, has never tried higher dose,  this is managed by PMR. Does mention increased tension in left trap. Dr. Gracie Lav previously recommended Botox injections but he is not interested in pursuing this as he is concerned this will cause his arm to be weaker. He has f/u visit with PMR in July and encouraged further discussion regarding potential benefit of Botox at that time.  Continues to have left shoulder pain, undergoes repeat cortisone injections by [facility] for arthritis (per his report) with some benefit. Has been told he would not be an appropriate candidate for shoulder replacement due to left arm deficits but is interested in getting a second opinion. Continues to wear left wrist brace for wrist drop which has helped. He is scheduled with OT on 6/4. He ambulates with a cane, no recent falls. Tries to stay active, goes to the gym 6 days weekly, walks 7 laps around the track (3 laps without cane) and working on getting up to 1 mile which is 10 laps. No new stroke/TIA symptoms.  Remains on topiramate  100 mg twice daily, no recent headaches.  Denies any seizure activity, off Keppra . Previously undergoing significant stress going through a divorce and had difficulty finding housing, he is still at temporary housing through the [place] and will be transitioning to permanent housing soon, his divorce will be finalized next week.  Remains on aspirin  81 mg daily.  Continues to follow with PCP for stroke risk factor management.  He is also routinely followed by cardiology.     History provided for reference purposes only UPDATE Dec 06 2022 Dr. Gracie Lav: H return for electrodiagnostic study today,  which showed mild bilateral median neuropathy across the wrist, consistent with mild bilateral carpal tunnel syndromes, there was no other left upper extremity focal neuropathy,  He also noticed worsening neck pain radiating pain to left shoulder, and extending to left dorsum forearm since he fell landed on his left elbow in June 2024,  He has significant  tightness of left pectoralis major latissimus dorsi, and the brachialis, would benefit botulism toxin injection for left upper and lower extremity spasticity   Initial visit 10/24/2022 Dr. Gracie Lav: [name], is a 58 year old male, seen in request by his primary care nurse practitioner Rogerio Clay, Amy J, for evaluation of worsening dysarthria, left arm function,  History is obtained from the patient and review of electronic medical records. I personally reviewed pertinent available imaging films in PACS.   PMHx of  Right thalamic bleeding in March 2022 HTN HLD CHF Seizure Used to drink beer 40 oz daily  He had a history of right thalamic intracranial bleeding with intraventricular breakthrough, symptomatic seizure in March 2022, with residual spastic left arm and leg weakness, neuropathic pain, significant left shoulder pain  MRI of left shoulder without contrast in March 2023 showed mild to moderate rotator cuff tendinosis without tear, severe intra-articular biceps tendinosis with mild tenosynovitis, mild glenohumeral and mild to moderate AC joint osteoarthritis  MRI of the brain in March 2023 chronic right thalamic hemorrhage with encephalomalacia hemosiderin deposition, moderate small vessel disease involving periventricular white matter, pons,  Acute phase MRI with without contrast in May 2022 showed acute right thalamic hemorrhage measuring up to 3.8 x 2.4 cm, intraventricular extension MRA of brain showed no vascular malformation,  He was able to ambulate, with some recovering of left upper extremity function, he fell in June 2024, backwards landed with his left elbow on the concrete floor, had severe left elbow pain, numbness tingling from left elbow down since then, seems to have worsening left hand function, harder for him to spread and extend his fingers, now he wear left wrist splint,  X-ray of left elbow was somewhat limited by his slight flexed position, the  radiocapitellar  and ulnohumeral joints are well located.  There is notable  ossification at the posterior aspect of the olecranon at the triceps  insertion.       PHYSICAL EXAM:   Vitals:   06/11/23 0751  BP: 125/74  Pulse: 69  Weight: 198 lb (89.8 kg)  Height: 5' 9.5" (1.765 m)     Body mass index is 28.82 kg/m.  PHYSICAL EXAMNIATION:  Gen: NAD, very pleasant middle-age male, conversant, well nourised, well groomed                     Cardiovascular: Regular rate rhythm, no peripheral edema, warm, nontender. Eyes: Conjunctivae clear without exudates or hemorrhage Neck: Supple, no carotid bruits. Pulmonary: Clear to auscultation bilaterally   NEUROLOGICAL EXAM:  MENTAL STATUS: Speech/cognition: Awake, alert, oriented to history taking and casual conversation, no dysarthria CRANIAL NERVES: CN II: Visual fields are full to confrontation. Pupils are round equal and briskly reactive to light. CN [name], IV, VI: extraocular movement are normal. No ptosis. CN V: Facial sensation is intact to light touch CN VII: Mild left lower face weakness CN VIII: Hearing is normal to causal conversation. CN IX, X: Phonation is normal. CN XI: Head turning and shoulder shrug are intact  MOTOR: Spastic left hemiparesis, limited range of motion of left shoulder, mild left shoulder abduction, external rotation weakness, tendency for left  arm pronation, wrist flexion, left elbow extension 5, flexion 4, wrist extension 3, flexion 4, finger abduction 3, mild tenderness of left pectoralis major, latissimus dorsi with passive stretch Moderate spasticity of left lower extremity, mild-mod left hip flexion weakness, mild left ankle dorsiflexion weakness  REFLEXES: Hyperreflexia of left upper and lower extremity SENSORY: Decreased light touch on the left side  COORDINATION: There is no trunk or limb dysmetria noted.  GAIT/STANCE: Push-up to get up from seated position, holding left elbow flexion pronation, wrist  flexion, high step, spastic left leg, difficulty clear from the floor, use of cane   REVIEW OF SYSTEMS:  Full 14 system review of systems performed and notable only for as above All other review of systems were negative.   ALLERGIES: Allergies  Allergen Reactions   Tizanidine      Other Reaction(s): Angioedema of tongue   Tamsulosin      Other Reaction(s): Not available    HOME MEDICATIONS: Current Outpatient Medications  Medication Sig Dispense Refill   amLODipine  (NORVASC ) 2.5 MG tablet Take 1 tablet (2.5 mg total) by mouth daily. 90 tablet 3   aspirin  EC 81 MG tablet Take 81 mg by mouth daily. Swallow whole.     atorvastatin  (LIPITOR) 80 MG tablet Take 1 tablet (80 mg total) by mouth daily. 90 tablet 1   baclofen  (LIORESAL ) 10 MG tablet Take 1 tablet (10 mg total) by mouth at bedtime. 90 each 3   folic acid  (FOLVITE ) 1 MG tablet Take 1 tablet by mouth once daily 30 tablet 3   gabapentin  (NEURONTIN ) 300 MG capsule Take 1 capsule (300 mg total) by mouth 3 (three) times daily. 270 capsule 3   labetalol  (NORMODYNE ) 100 MG tablet Take 1 tablet (100 mg total) by mouth 2 (two) times daily. 180 tablet 0   levofloxacin  (LEVAQUIN ) 750 MG tablet Take 750 mg by mouth daily. TAKE THE MORNING OF THE BIOPSY     losartan  (COZAAR ) 50 MG tablet Take 1 tablet (50 mg total) by mouth daily. 90 tablet 3   Multiple Vitamins-Minerals (CERTAVITE/ANTIOXIDANTS) TABS Take 1 tablet by mouth daily. 30 tablet 0   sildenafil (REVATIO) 20 MG tablet Take 1-3 tablets by mouth as needed.     tadalafil (CIALIS) 5 MG tablet Take 5 mg by mouth every morning.     tamsulosin  (FLOMAX ) 0.4 MG CAPS capsule Take 0.4 mg by mouth daily at 6 (six) AM.     thiamine  100 MG tablet Take 1 tablet (100 mg total) by mouth daily. 30 tablet 0   topiramate  (TOPAMAX ) 100 MG tablet Take 1 tablet (100 mg total) by mouth 2 (two) times daily. 180 tablet 3   No current facility-administered medications for this visit.    PAST MEDICAL  HISTORY: Past Medical History:  Diagnosis Date   Anxiety    Cataract    removed left eye   Chronic combined systolic and diastolic CHF (congestive heart failure) (HCC)    EF 25-30%   Diastolic dysfunction    Dyslipidemia    Hyperlipidemia    Hypertension    Neuromuscular disorder (HCC)    neuropathy left foot   Nonischemic dilated cardiomyopathy (HCC)    felt secondary to HTN with no ischemia on nuclear stress test 05/2012, EF 25-30%   Seizures (HCC)    last seizure 05-2020- on Keppra    Shortness of breath    Stroke (HCC) 05/2020    PAST SURGICAL HISTORY: Past Surgical History:  Procedure Laterality Date   COLONOSCOPY WITH PROPOFOL  N/A  12/09/2020   Procedure: COLONOSCOPY WITH PROPOFOL ;  Surgeon: Ace Holder, MD;  Location: [facility];  Service: Gastroenterology;  Laterality: N/A;   KNEE ARTHROSCOPY Left 04/13/2016   Procedure: ARTHROSCOPY KNEE WITH DEBRIDEMENT;  Surgeon: Shirlee Dotter, MD;  Location: [facility];  Service: Orthopedics;  Laterality: Left;   KNEE ARTHROSCOPY WITH MEDIAL MENISECTOMY Right 04/13/2016   Procedure: KNEE ARTHROSCOPY WITH MEDIAL MENISECTOMY;  Surgeon: Shirlee Dotter, MD;  Location:  SUR[facility]ERY [facility];  Service: Orthopedics;  Laterality: LEFT not right knee   NASAL FRACTURE SURGERY     SHOULDER SURGERY Left     FAMILY HISTORY: Family History  Problem Relation Age of Onset   Hypertension Mother        Passed away when she is 25 secondary to hip surgery complications   CAD Mother    Heart attack Mother    Hypertension Father    Diabetes Brother    Stroke Brother    Heart attack Maternal Grandmother    Colon cancer Neg Hx    Esophageal cancer Neg Hx    Pancreatic cancer Neg Hx    Stomach cancer Neg Hx    Colon polyps Neg Hx     SOCIAL HISTORY: Social History   Socioeconomic History   Marital status: Married    Spouse name: Not on file   Number of children: Not on file   Years of education: Not on  file   Highest education level: Not on file  Occupational History   Not on file  Tobacco Use   Smoking status: Former    Types: Cigarettes    Passive exposure: Past   Smokeless tobacco: Never  Vaping Use   Vaping status: Never Used  Substance and Sexual Activity   Alcohol  use: Not Currently   Drug use: Not Currently    Types: Cocaine, Marijuana, Oxycodone    Sexual activity: Yes  Other Topics Concern   Not on file  Social History Narrative   Not on file   Social Drivers of Health   Financial Resource Strain: Not on file  Food Insecurity: Not on file  Transportation Needs: Not on file  Physical Activity: Not on file  Stress: Not on file  Social Connections: Unknown (06/07/2021)   Received from [facility], [facility]   Social Network    Social Network: Not on file  Intimate Partner Violence: Unknown (04/28/2021)   Received from [facility], [facility]   HITS    Physically Hurt: Not on file    Insult or Talk Down To: Not on file    Threaten Physical Harm: Not on file    Scream or Curse: Not on file     I spent 30 minutes of face-to-face and non-face-to-face time with patient.  This included previsit chart review, lab review, study review, order entry, electronic health record documentation, patient education and discussion regarding above diagnoses and treatment plan and answered all other questions to patient's satisfaction  Johny Nap, [facility]  [facility] [address] [place], [place] [address]  Phone [phone] Fax [phone] Note: This document was prepared with digital dictation and possible smart phrase technology. Any transcriptional errors that result from this process are unintentional.

## 2023-06-27 ENCOUNTER — Ambulatory Visit: Attending: Orthopedic Surgery | Admitting: Occupational Therapy

## 2023-06-27 ENCOUNTER — Other Ambulatory Visit: Payer: Self-pay

## 2023-06-27 ENCOUNTER — Encounter: Payer: Self-pay | Admitting: Occupational Therapy

## 2023-06-27 DIAGNOSIS — R208 Other disturbances of skin sensation: Secondary | ICD-10-CM | POA: Insufficient documentation

## 2023-06-27 DIAGNOSIS — G8929 Other chronic pain: Secondary | ICD-10-CM | POA: Insufficient documentation

## 2023-06-27 DIAGNOSIS — M6281 Muscle weakness (generalized): Secondary | ICD-10-CM | POA: Insufficient documentation

## 2023-06-27 DIAGNOSIS — M25512 Pain in left shoulder: Secondary | ICD-10-CM | POA: Insufficient documentation

## 2023-06-27 DIAGNOSIS — R29818 Other symptoms and signs involving the nervous system: Secondary | ICD-10-CM | POA: Diagnosis not present

## 2023-06-27 DIAGNOSIS — R278 Other lack of coordination: Secondary | ICD-10-CM | POA: Insufficient documentation

## 2023-06-27 DIAGNOSIS — R29898 Other symptoms and signs involving the musculoskeletal system: Secondary | ICD-10-CM | POA: Insufficient documentation

## 2023-06-27 DIAGNOSIS — R2681 Unsteadiness on feet: Secondary | ICD-10-CM | POA: Diagnosis not present

## 2023-06-27 DIAGNOSIS — I69354 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-dominant side: Secondary | ICD-10-CM | POA: Diagnosis not present

## 2023-06-27 NOTE — Therapy (Signed)
 OUTPATIENT OCCUPATIONAL THERAPY NEURO EVALUATION  Patient Name: [name] MRN: [id number] DOB:[birth year], 58 y.o., male Today's Date: 06/27/2023  PCP: Senaida Dama, NP REFERRING PROVIDER: Merrill Abide, MD   END OF SESSION:  OT End of Session - 06/27/23 1223     Visit Number 1    Number of Visits 10    Date for OT Re-Evaluation 08/10/23    Authorization Type [employer]/Medicaid covered @ 100%    Authorization Time Period Met VL: MN    OT Start Time 1230    OT Stop Time 1315    OT Time Calculation (min) 45 min    Equipment Utilized During Treatment Testing Material    Activity Tolerance Patient tolerated treatment well    Behavior During Therapy WFL for tasks assessed/performed             Past Medical History:  Diagnosis Date   Anxiety    Cataract    removed left eye   Chronic combined systolic and diastolic CHF (congestive heart failure) (HCC)    EF 25-30%   Diastolic dysfunction    Dyslipidemia    Hyperlipidemia    Hypertension    Neuromuscular disorder (HCC)    neuropathy left foot   Nonischemic dilated cardiomyopathy (HCC)    felt secondary to HTN with no ischemia on nuclear stress test 05/2012, EF 25-30%   Seizures (HCC)    last seizure 05-2020- on Keppra    Shortness of breath    Stroke (HCC) 05/2020   Past Surgical History:  Procedure Laterality Date   COLONOSCOPY WITH PROPOFOL  N/A 12/09/2020   Procedure: COLONOSCOPY WITH PROPOFOL ;  Surgeon: Ace Holder, MD;  Location: [facility];  Service: Gastroenterology;  Laterality: N/A;   KNEE ARTHROSCOPY Left 04/13/2016   Procedure: ARTHROSCOPY KNEE WITH DEBRIDEMENT;  Surgeon: Shirlee Dotter, MD;  Location: [facility];  Service: Orthopedics;  Laterality: Left;   KNEE ARTHROSCOPY WITH MEDIAL MENISECTOMY Right 04/13/2016   Procedure: KNEE ARTHROSCOPY WITH MEDIAL MENISECTOMY;  Surgeon: Shirlee Dotter, MD;  Location: [facility];  Service: Orthopedics;  Laterality:  LEFT not right knee   NASAL FRACTURE SURGERY     SHOULDER SURGERY Left    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) 10/24/2022   Chronic diastolic CHF (congestive heart failure) (HCC) 11/07/2021   Left ventricular hypertrophy 11/07/2021   Osteoarthritis of left glenohumeral joint 05/04/2021   Colon cancer screening    Left hemiplegia (HCC) 07/02/2020   Cognitive deficit due to recent stroke 07/02/2020   Dysphagia, post-stroke    Vascular headache    Acute blood loss anemia    AKI (acute kidney injury) (HCC)    Essential hypertension    Partial seizure (HCC)    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   Intracranial hemorrhage (HCC) 06/09/2020   Sprain of anterior talofibular ligament of left ankle 01/15/2020   Hyperlipidemia, on Lipitor 04/01/2017   Alcohol  use disorder, mild, abuse 06/30/2016   Morbid obesity (HCC) 06/30/2016   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   Chronic combined systolic and diastolic CHF (congestive heart failure) (HCC) 11/08/2012   HTN (hypertension) 05/25/2012    ONSET DATE: 05/29/2023   REFERRING DIAG: 05/29/2023   THERAPY DIAG:  Muscle weakness (generalized)  Other lack of coordination  Chronic left shoulder pain  Other disturbances of skin sensation  Other symptoms and  signs involving the nervous system  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-dominant side (HCC)  Rationale for Evaluation and Treatment: Rehabilitation  SUBJECTIVE:   SUBJECTIVE STATEMENT: Pt reports that he was given a brace for his wrist but he still has limited use of this hand and the wrist drops. He continues to go to the [facility].   Pt reports he is to have EMG and MRI per his recent neurology visit. He has not gotten a NMES unit. He feels overall his LUE is getting worse and it is  tingling more, especially since his nerve conduction test ~ 3 months ago  Pt accompanied by: self - drives himself [employer]  PERTINENT HISTORY:   PMHx: history of ICH, seizures and headaches; residual left sided weakness and nerve pain. HTN, CHF, cardiomyopathy, OA  MD note 05/29/23  For the time being, given his ongoing improvement, he would like to continue with wrist bracing. I recommended that he be seen once again by occupational therapy for fabrication of an orthosis. At that juncture, he can continue with home exercises particularly for wrist extension and digital extension. He can return to me as needed moving forward should he develop worsening issues or progressive weakness with worsening wrist drop.   From MD appt 10/24/22 Based on his clinical examination today, there is concern for a more centralized source of nerve pathology.  From a peripheral nerve standpoint, there is a possibility for ongoing radial nerve palsy based on his examination, however given the prior stroke history with known left hemiplegia and more recent worsening symptoms, I would like him to be reevaluated by neurology to rule out CNS or root pathology that could be contributing to his ongoing progressive symptoms.   Given that he has sensory and motor deficits to both the upper and lower extremity, we will place referral back to neurology for repeat evaluation.  From a peripheral nerve/hand surgery perspective, I will have him fitted today for a wrist brace to help with ongoing wrist drop.  I am happy to see him back in the future should we rule out any central nervous system further workup/treatment for consideration of potential left upper extremity nerve study to look for notable sites of compression.   We did discuss etiology and pathophysiology of neuropraxia within the left upper extremity.  There may be an element of recovery that he can regain with time, however given that his symptoms may be worsening,  this is why I would like him to be reevaluated by neurology.   In the future, he would also be a reasonable candidate for discussion regarding tendon transfers in order to help with wrist drop, however if there is an underlying centralized component, this may preclude our ability to utilize other structures within the upper extremity.  He certainly has a very difficult situation regarding his residual neurologic deficits and underlying shoulder arthritis. I suspect that his arthritis has been exacerbated by his previous fall as well as the dyskinesis related to his CVA with spasticity. We have discussed at length the long-term prognosis and treatment options. I recommended an intra-articular injection for symptomatic relief. He is in agreement and this is performed today using standard technique.   MRI results:  1. Mild-moderate rotator cuff tendinosis without tear. 2. Severe intra-articular biceps tendinosis with mild tenosynovitis. 3. 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  PRECAUTIONS: None  WEIGHT BEARING RESTRICTIONS: No  PAIN:  Are you having pain? Yes: NPRS scale: 7-8 continuously and "  not usually less" Pain location: L side Pain description: tingling - Pt reports he needs MRI per MD - needs order to check if spine is causing issues Aggravating factors: More intense in the evening  Relieving factors: Uncertain- previously Gabapentin  and Voltaren  gel  FALLS: Has patient fallen in last 6 months? No Last fall at [facility] s/p previous therapy in 2024 when another person rolled over his foot and fell back on his L arm  LIVING ENVIRONMENT:  [facility] (awaiting permanent housing s/p inspection in [place]) Divorce final July 30th Lives in: one level dorm Stairs: No Has following equipment at home: Counselling psychologist, Hemi walker, Wheelchair (manual), Adult nurse chair (no back)  PLOF: Independent  PATIENT GOALS: Improve use of LUE  and overall ease with completion of daily activities.  OBJECTIVE:  Note: Objective measures were completed at Evaluation unless otherwise noted.  HAND DOMINANCE: Right  ADLs: Eating: needs assist to cut food Grooming: mod I  UB Dressing: mod I, difficulty w/ buttons LB Dressing: assist to tie shoes Toileting: mod I  Bathing: mod I w/ difficulty getting under Rt arm Tub Shower transfers: mod I  Equipment: Shower seat with back  IADLs: Shopping: mod independent Light housekeeping: mod I  Meal Prep: uses air fryer, Anitra Ket, microwave Community mobility: drives I'ly Medication management: mod I  Handwriting:  denies change  MOBILITY STATUS: Independent; uses quad cane; increased time  ACTIVITY TOLERANCE: Activity tolerance: good  POSTURE COMMENTS:  rounded shoulders and forward head Sitting balance: WFL  FUNCTIONAL OUTCOME MEASURES: TBD - PSFS  UPPER EXTREMITY ROM:    AROM Right eval Left Eval 09/13/22 LEFT 11/13/2022  Shoulder flexion WNL 120* with ataxia 120*  Shoulder abduction WNL 90* with ataxia reports limiting ROM in this direction as his shoulder pops out of socket 110*  Elbow flexion WNL 132* 132*  Elbow extension WNL -36* -25*  Wrist flexion WNL WFL   Wrist extension WNL N/A past neutral 30*  Wrist pronation WNL WFL WNL  Wrist supination WNL 60* with time 60*  (Blank rows = not tested)  UPPER EXTREMITY MMT:     MMT Right eval Left eval  Shoulder flexion WNL WFL within available ROM  Shoulder abduction WNL "  Elbow flexion WNL WNL  Elbow extension WNL WNL  (Blank rows = not tested)  L Digit flex and ext: WNL; Digit opposition: BFL  HAND FUNCTION: Eval 06/27/23 Grip strength: Right: 117.7, 111.9 lbs; Left: 28.4, 27.1 lbs Left Average: 27.8 lbs  11/13/2022: LUE: 31.9 lbs  COORDINATION: Box and Blocks:  Right 52 blocks, Left 12 blocks  11/13/2022: Right 47 blocks, Left 8 blocks  SENSATION: Tingles on L hand, drooling at  times. Previous report noted Light touch: absent Proprioception: Impaired   EDEMA: NA  MUSCLE TONE: LUE: Mild and Hypertonic  COGNITION: Overall cognitive status: Within functional limits for tasks assessed  VISION: Subjective report: No change Baseline vision: Wears glasses for reading only Visual history: corrective eye surgery  TODAY'S TREATMENT:    Self Care: Pt education initiated re: OT role, POC considerations, HEP ideas and splinting options.  Pt encouraged to bring his splint to therapy to assess for additional needs.  He was able to demonstrate wrist extension without splint in place and is encouraged to work on active wrist and digital extension.  PATIENT EDUCATION: Education details: OT role, POC considerations and HEP activities Person educated: Patient Education method: Explanation Education comprehension: verbalized understanding and needs further education  HOME EXERCISE PROGRAM: 09/20/22 Started HEP with Shoulder Shrugs Access Code: [email or web address] 10/31/22 Progressed HEP for elbow, forearm, wrist and grip Same Access Code   GOALS: Goals reviewed with patient? Yes  SHORT TERM GOALS: Target date: 07/20/23   Patient will demonstrate LUE ROM HEP with visual cues for proper execution with HEP schedule for carryover. Baseline: returning to outpt OT due to reported decreased strength Goal status: IN PROGRESS  2.  Pt will be independent with splint wear and care for LUE Baseline: Arrived to therapy without his wrist splint. MD report notes "OT for fabrication of an orthosis' Goal status: INITIAL  3.  Pt to verbalize understanding of safety considerations d/t sensory loss LUE Baseline: Previous education provided Goal status: IN PROGRESS  4. Patient will be able to identify 1-2 sensory stimulation activities to help improve feeling in LUE or  interrupt negative sensory feeling.   Baseline: Returning to outpt OT with increased tingling in LUE. Goal status: INITIAL  LONG TERM GOALS: Target date: 08/10/23  Pt will demonstrate active wrist extension >25* Baseline: 10* Goal status: INITIAL  2.  Pt will be able to place at least 15 blocks using left hand with completion of Box and Blocks test. Baseline: Right 52 blocks, Left 12 blocks Goal status: IN PROGRESS  3.  Patient will demonstrate at least 30+ lbs LUE grip strength as needed to assist with ADLs. Baseline: Average: 27.8 lbs Goal status: INITIAL  4.  Pt will perform home management and stovetop based meal prep with good safety awareness and use of modifications/DME as needed.  Baseline: Lives at [place] but is awaiting approval for independent living Goal status: INITIAL  ASSESSMENT:  CLINICAL IMPRESSION: Patient is a 58 y.o. male who was seen today for occupational therapy evaluation for UE dysfunction s/p history of CVA. Hx includes history of ICH, seizures and headaches; residual left sided weakness and nerve pain. HTN, CHF, cardiomyopathy, OA, carpal tunnel syndrome. Patient currently presents with slightly decreased UE strength compared to prior DC 10/2022 with some increased sensory issues reportesd also. Pt would benefit from skilled OT services in the outpatient setting to work on impairments as noted below to help pt return to PLOF as able.    PERFORMANCE DEFICITS: in functional skills including ADLs, IADLs, coordination, dexterity, proprioception, sensation, tone, ROM, strength, pain, fascial restrictions, muscle spasms, flexibility, Fine motor control, Gross motor control, balance, decreased knowledge of precautions, decreased knowledge of use of DME, and UE functional use, cognitive skills including problem solving and safety awareness, and psychosocial skills including coping strategies, environmental adaptation, and routines and behaviors.   IMPAIRMENTS: are limiting  patient from ADLs, rest and sleep, work, and leisure.   CO-MORBIDITIES: has co-morbidities such as HTN, CHF that affects occupational performance. Patient will benefit from skilled OT to address above impairments and improve overall function.  MODIFICATION OR ASSISTANCE TO COMPLETE EVALUATION: Min-Moderate modification of tasks or assist with assess necessary to complete an evaluation.  OT OCCUPATIONAL PROFILE AND HISTORY: Detailed assessment: Review of  records and additional review of physical, cognitive, psychosocial history related to current functional performance.  CLINICAL DECISION MAKING: Moderate - several treatment options, min-mod task modification necessary  REHAB POTENTIAL: Good for established goals  EVALUATION COMPLEXITY: Moderate    PLAN:  OT FREQUENCY: 1-2x/week  OT DURATION: up to 6 weeks - only scheduling 4 weeks to begin  PLANNED INTERVENTIONS: 97535 self care/ADL training, 16109 therapeutic exercise, 97530 therapeutic activity, 97112 neuromuscular re-education, 97140 manual therapy, 97035 ultrasound, [id number] electrical stimulation (manual), 97014 electrical stimulation unattended, 97760 Orthotic Initial, 97763 Orthotic/Prosthetic subsequent, passive range of motion, coping strategies training, patient/family education, and DME and/or AE instructions  RECOMMENDED OTHER SERVICES: Maybe ST - pt reports some drooling recently  CONSULTED AND AGREED WITH PLAN OF CARE: Patient  PLAN FOR NEXT SESSION:  Check splinting needs Complete PSFS Assess sensation/stereognosis Further assess AROM LUE and ataxia Reprint/update HEP   Zora Hires, OT 06/27/2023, 5:07 PM

## 2023-06-29 ENCOUNTER — Encounter: Payer: Self-pay | Admitting: Neurology

## 2023-07-02 ENCOUNTER — Encounter: Payer: Self-pay | Admitting: Neurology

## 2023-07-06 ENCOUNTER — Ambulatory Visit
Admission: RE | Admit: 2023-07-06 | Discharge: 2023-07-06 | Disposition: A | Discharge status: Home / Self Care | Source: Ambulatory Visit | Attending: Neurology | Admitting: Neurology

## 2023-07-06 DIAGNOSIS — I69391 Dysphagia following cerebral infarction: Secondary | ICD-10-CM | POA: Diagnosis not present

## 2023-07-06 DIAGNOSIS — I69954 Hemiplegia and hemiparesis following unspecified cerebrovascular disease affecting left non-dominant side: Secondary | ICD-10-CM | POA: Diagnosis not present

## 2023-07-06 DIAGNOSIS — R569 Unspecified convulsions: Secondary | ICD-10-CM

## 2023-07-06 DIAGNOSIS — I629 Nontraumatic intracranial hemorrhage, unspecified: Secondary | ICD-10-CM | POA: Diagnosis not present

## 2023-07-09 ENCOUNTER — Telehealth: Payer: Self-pay | Admitting: Neurology

## 2023-07-09 NOTE — Telephone Encounter (Signed)
 Please call patient, MRI of cervical spine showed multilevel degenerative changes, most noticeable at lower cervical region, with evidence of mild to moderate canal stenosis at C4-5, C5-6, no cord signal abnormality  If he has a lot of question about his MRI, may setup in personal (preferred) or virtual visit    IMPRESSION: This MRI of the cervical spine without contrast shows the following: Multilevel degenerative changes as detailed above.  This causes moderate spinal stenosis at C4-C5 and C5-C6 and mild spinal stenosis at C3-C4 and C6-C7.  Additionally, various degrees of foraminal narrowing could affect the bilateral C4 nerve roots, bilateral C5 nerve roots and right C6 nerve root. Homogenous reduced lipid content in the bone marrow most likely reflects bone marrow reconversion.  This is most commonly seen in smokers and could also be seen with some anemias and obesity. No acute findings.

## 2023-07-10 NOTE — Telephone Encounter (Signed)
 Pt returning call  , request call back

## 2023-07-10 NOTE — Telephone Encounter (Signed)
 Unaable to lvm 1st attempt by hf

## 2023-07-10 NOTE — Telephone Encounter (Signed)
 Called and relayed information to the father who preferred the information to be relayed to the son

## 2023-07-10 NOTE — Telephone Encounter (Signed)
 Lmtrc 1st attempt by hf 07/10/23

## 2023-07-11 ENCOUNTER — Ambulatory Visit: Admitting: Occupational Therapy

## 2023-07-11 DIAGNOSIS — R278 Other lack of coordination: Secondary | ICD-10-CM | POA: Diagnosis not present

## 2023-07-11 DIAGNOSIS — R2681 Unsteadiness on feet: Secondary | ICD-10-CM

## 2023-07-11 DIAGNOSIS — R29898 Other symptoms and signs involving the musculoskeletal system: Secondary | ICD-10-CM | POA: Diagnosis not present

## 2023-07-11 DIAGNOSIS — R29818 Other symptoms and signs involving the nervous system: Secondary | ICD-10-CM

## 2023-07-11 DIAGNOSIS — M6281 Muscle weakness (generalized): Secondary | ICD-10-CM | POA: Diagnosis not present

## 2023-07-11 DIAGNOSIS — R208 Other disturbances of skin sensation: Secondary | ICD-10-CM | POA: Diagnosis not present

## 2023-07-11 DIAGNOSIS — I69354 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-dominant side: Secondary | ICD-10-CM | POA: Diagnosis not present

## 2023-07-11 DIAGNOSIS — G8929 Other chronic pain: Secondary | ICD-10-CM | POA: Diagnosis not present

## 2023-07-11 DIAGNOSIS — M25512 Pain in left shoulder: Secondary | ICD-10-CM | POA: Diagnosis not present

## 2023-07-11 NOTE — Telephone Encounter (Signed)
 PT PRESENT IN OFFICE: Results given face to face pt scheduled for appt to go over in detail as dr. Gracie Lav suggested

## 2023-07-11 NOTE — Therapy (Signed)
 OUTPATIENT OCCUPATIONAL THERAPY NEURO TREATMENT  Patient Name: [name] MRN: [id number] DOB:[birth year], 58 y.o., male Today's Date: 07/11/2023  PCP: Senaida Dama, NP REFERRING PROVIDER: Merrill Abide, MD   END OF SESSION:  OT End of Session - 07/11/23 1449     Visit Number 2    Number of Visits 10    Date for OT Re-Evaluation 08/10/23    Authorization Type [employer]/Medicaid covered @ 100%    Authorization Time Period Met VL: MN    OT Start Time 1449    OT Stop Time 1530    OT Time Calculation (min) 41 min    Activity Tolerance Patient tolerated treatment well    Behavior During Therapy WFL for tasks assessed/performed          Past Medical History:  Diagnosis Date   Anxiety    Cataract    removed left eye   Chronic combined systolic and diastolic CHF (congestive heart failure) (HCC)    EF 25-30%   Diastolic dysfunction    Dyslipidemia    Hyperlipidemia    Hypertension    Neuromuscular disorder (HCC)    neuropathy left foot   Nonischemic dilated cardiomyopathy (HCC)    felt secondary to HTN with no ischemia on nuclear stress test 05/2012, EF 25-30%   Seizures (HCC)    last seizure 05-2020- on Keppra    Shortness of breath    Stroke (HCC) 05/2020   Past Surgical History:  Procedure Laterality Date   COLONOSCOPY WITH PROPOFOL  N/A 12/09/2020   Procedure: COLONOSCOPY WITH PROPOFOL ;  Surgeon: Ace Holder, MD;  Location: [facility];  Service: Gastroenterology;  Laterality: N/A;   KNEE ARTHROSCOPY Left 04/13/2016   Procedure: ARTHROSCOPY KNEE WITH DEBRIDEMENT;  Surgeon: Shirlee Dotter, MD;  Location: [facility];  Service: Orthopedics;  Laterality: Left;   KNEE ARTHROSCOPY WITH MEDIAL MENISECTOMY Right 04/13/2016   Procedure: KNEE ARTHROSCOPY WITH MEDIAL MENISECTOMY;  Surgeon: Shirlee Dotter, MD;  Location: [facility];  Service: Orthopedics;  Laterality: LEFT not right knee   NASAL FRACTURE SURGERY     SHOULDER  SURGERY Left    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) 10/24/2022   Chronic diastolic CHF (congestive heart failure) (HCC) 11/07/2021   Left ventricular hypertrophy 11/07/2021   Osteoarthritis of left glenohumeral joint 05/04/2021   Colon cancer screening    Left hemiplegia (HCC) 07/02/2020   Cognitive deficit due to recent stroke 07/02/2020   Dysphagia, post-stroke    Vascular headache    Acute blood loss anemia    AKI (acute kidney injury) (HCC)    Essential hypertension    Partial seizure (HCC)    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   Intracranial hemorrhage (HCC) 06/09/2020   Sprain of anterior talofibular ligament of left ankle 01/15/2020   Hyperlipidemia, on Lipitor 04/01/2017   Alcohol  use disorder, mild, abuse 06/30/2016   Morbid obesity (HCC) 06/30/2016   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   Chronic combined systolic and diastolic CHF (congestive heart failure) (HCC) 11/08/2012   HTN (hypertension) 05/25/2012    ONSET DATE: 05/29/2023   REFERRING DIAG: 05/29/2023   THERAPY DIAG:  Muscle weakness (generalized)  Other lack of coordination  Other disturbances of skin sensation  Other symptoms and signs involving the nervous system  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-dominant side (HCC)  Unsteadiness on feet  Rationale for Evaluation and Treatment: Rehabilitation  SUBJECTIVE:   SUBJECTIVE STATEMENT:  Pt reports he had an MRI of his spine but he hasn't got the results.  OT helped him check in MyChart and pt planned to go next door after this appt to set up a follow up.  Pt also reported he got the keys to his apartment at [employer] today and will be out of the [place] by 07/26/23.  Pt brought his larger brace for his wrist but forgot his smaller one in the  car. [facility] but didn't quite reach 2 miles.  Pt accompanied by: self   PERTINENT HISTORY:   PMHx: history of ICH, seizures and headaches; residual left sided weakness and nerve pain. HTN, CHF, cardiomyopathy, OA  MD telephone encounter 07/09/23: MRI of cervical spine showed multilevel degenerative changes, most noticeable at lower cervical region, with evidence of mild to moderate canal stenosis at C4-5, C5-6, no cord signal abnormality If he has a lot of question about his MRI, may setup in personal (preferred) or virtual visit IMPRESSION: This MRI of the cervical spine without contrast shows the following: Multilevel degenerative changes as detailed above.  This causes moderate spinal stenosis at C4-C5 and C5-C6 and mild spinal stenosis at C3-C4 and C6-C7.  Additionally, various degrees of foraminal narrowing could affect the bilateral C4 nerve roots, bilateral C5 nerve roots and right C6 nerve root. Homogenous reduced lipid content in the bone marrow most likely reflects bone marrow reconversion.  This is most commonly seen in smokers and could also be seen with some anemias and obesity. No acute findings.  MD note 05/29/23  For the time being, given his ongoing improvement, he would like to continue with wrist bracing. I recommended that he be seen once again by occupational therapy for fabrication of an orthosis. At that juncture, he can continue with home exercises particularly for wrist extension and digital extension. He can return to me as needed moving forward should he develop worsening issues or progressive weakness with worsening wrist drop.   Prior MRI results:  1. Mild-moderate rotator cuff tendinosis without tear. 2. Severe intra-articular biceps tendinosis with mild tenosynovitis. 3. 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  PRECAUTIONS: None  WEIGHT BEARING RESTRICTIONS: No  PAIN:  Are you  having pain? Yes: NPRS scale: 7-8 continuously and not usually less Pain location: L side Pain description: tingling - Pt reports he needs MRI per MD - needs order to check if spine is causing issues Aggravating factors: More intense in the evening  Relieving factors: Uncertain- previously Gabapentin  and Voltaren  gel  FALLS: Has patient fallen in last 6 months? No [facility] fall at [facility]/p previous therapy in 2024 when another person rolled over his foot and fell back on his L arm  LIVING ENVIRONMENT:  [facility] (awaiting permanent housing s/p inspection in [place]) Divorce final July 30th Lives in: one level dorm Stairs: No Has following equipment at home: Counselling psychologist, Hemi walker, Wheelchair (manual), Adult nurse chair (no back)  PLOF: Independent  PATIENT GOALS: Improve use of LUE and overall ease with completion of daily activities.  OBJECTIVE:  Note: Objective measures were completed at Evaluation unless otherwise noted.  HAND DOMINANCE: Right  ADLs: Eating: needs assist to cut food Grooming: mod I  UB Dressing: mod I, difficulty w/ buttons LB Dressing: assist to  tie shoes Toileting: mod I  Bathing: mod I w/ difficulty getting under Rt arm Tub Shower transfers: mod I  Equipment: Shower seat with back  IADLs: Shopping: mod independent Light housekeeping: mod I  Meal Prep: uses air fryer, Anitra Ket, microwave Community mobility: drives I'ly Medication management: mod I  Handwriting:  denies change  MOBILITY STATUS: Independent; uses quad cane; increased time  ACTIVITY TOLERANCE: Activity tolerance: good  POSTURE COMMENTS:  rounded shoulders and forward head Sitting balance: WFL  FUNCTIONAL OUTCOME MEASURES: TBD - PSFS  UPPER EXTREMITY ROM:    AROM Right eval Left Eval 09/13/22 LEFT 11/13/2022  Shoulder flexion WNL 120* with ataxia 120*  Shoulder abduction WNL 90* with ataxia reports limiting ROM in this direction  as his shoulder pops out of socket 110*  Elbow flexion WNL 132* 132*  Elbow extension WNL -36* -25*  Wrist flexion WNL WFL   Wrist extension WNL N/A past neutral 30*  Wrist pronation WNL WFL WNL  Wrist supination WNL 60* with time 60*  (Blank rows = not tested)  UPPER EXTREMITY MMT:     MMT Right eval Left eval  Shoulder flexion WNL WFL within available ROM  Shoulder abduction WNL   Elbow flexion WNL WNL  Elbow extension WNL WNL  (Blank rows = not tested)  L Digit flex and ext: WNL; Digit opposition: BFL  HAND FUNCTION: Eval 06/27/23 Grip strength: Right: 117.7, 111.9 lbs; Left: 28.4, 27.1 lbs Left Average: 27.8 lbs  11/13/2022: LUE: 31.9 lbs  COORDINATION: Box and Blocks:  Right 52 blocks, Left 12 blocks  11/13/2022: Right 47 blocks, Left 8 blocks  SENSATION: Tingles on L hand, drooling at times. Previous report noted Light touch: absent Proprioception: Impaired   EDEMA: NA  MUSCLE TONE: LUE: Mild and Hypertonic  COGNITION: Overall cognitive status: Within functional limits for tasks assessed  VISION: Subjective report: No change Baseline vision: Wears glasses for reading only Visual history: corrective eye surgery                                                                                                                           TODAY'S TREATMENT:    Splinting:  Pt had his wrist cock up splint applied today and reports that this seems to be enough for him at this time and denies need for custom fabricated splint.  OT did inspect splint and noted that it is worn, was able to remove fuzz from the velcro hook to allow it to adhere better.   OT to check with vendor to see it he is eligible for a new pre-fab splint as pt benefits from prefab hand splints to help prevent wrist drop, limit pain, and improve overall functional use of LUE. Recommend prefab vs fab due to chronicity of joint contractures, adjustability, comfort, and ease of cleaning.  Pt provided a  couple of stockinette sleeves to put under his splint for moisture barrier in the heat of the upcoming summer.  Self Care:  OT able to recall 3/5 safety considerations for loss of sensation to reduce risk of injury to affected hand.  Pt aware of hot/cold and sharps with guidance to identify breakable, heavy objects and chemicals. Patient verbalized understanding.    Pt assisted with planning for independent living with consideration of AE including cart for groceries, laundry basket with wheels and education on use of tub transfer bench.   Pt provided [email or web address] images of options for him to consider as wheels will allow increased ease with moving laundry safely at home as even while hugging a laundry basket today to prevent it form falling out of his L hand, he is no able to safely walk without his cane.  Portable grocery cart with wheels is recommended over a wagon (per pt's interest) due to portability and weight of items.   Reviewed ADL tranfers with tub bench with demo provided re: short, suction legs inside tub, longer legs outside tub and then sitting on the bench to swing legs in/out without having to step in/out of the tub.  Pt appreciative of info/demo as he reported he would have been doing it wrong.   Pt has concerns about his ex's negative involvement in his life and reports he is unable to get a restraining order but plans to get a ring camera for his door and adamantly states need to deny is ex-spouse access to him in his new home.  Pt also does not have a phone at this time and is encouraged to [facility] this with the [facility] before he leaves as there are options for emergency phone.   PATIENT EDUCATION: Education details: Splint and AE recommendations. Person educated: Patient Education method: Explanation, Demonstration, Verbal cues, and Handouts Education comprehension: verbalized understanding, returned demonstration, verbal cues required, and needs further education  HOME  EXERCISE PROGRAM: 09/20/22 Started HEP with Shoulder Shrugs Access Code: [email or web address] 10/31/22 Progressed HEP for elbow, forearm, wrist and grip Same Access Code   GOALS: Goals reviewed with patient? Yes  SHORT TERM GOALS: Target date: 07/20/23   Patient will demonstrate LUE ROM HEP with visual cues for proper execution with HEP schedule for carryover. Baseline: returning to outpt OT due to reported decreased strength Goal status: IN PROGRESS  2.  Pt will be independent with splint wear and care for LUE Baseline: Arrived to therapy without his wrist splint. MD report notes OT for fabrication of an orthosis' Goal status: IN Progress  3.  Pt to verbalize understanding of safety considerations d/t sensory loss LUE Baseline: Previous education provided Goal status: IN PROGRESS  4. Patient will be able to identify 1-2 sensory stimulation activities to help improve feeling in LUE or interrupt negative sensory feeling.   Baseline: Returning to outpt OT with increased tingling in LUE. Goal status: INITIAL  LONG TERM GOALS: Target date: 08/10/23  Pt will demonstrate active wrist extension >25* Baseline: 10* Goal status: INITIAL  2.  Pt will be able to place at least 15 blocks using left hand with completion of Box and Blocks test. Baseline: Right 52 blocks, Left 12 blocks Goal status: IN PROGRESS  3.  Patient will demonstrate at least 30+ lbs LUE grip strength as needed to assist with ADLs. Baseline: Average: 27.8 lbs Goal status: INITIAL  4.  Pt will perform home management and stovetop based meal prep with good safety awareness and use of modifications/DME as needed.  Baseline: Lives at [place] but is awaiting approval for independent living Goal status: INITIAL  ASSESSMENT:  CLINICAL IMPRESSION: Patient is a 58 y.o. male who was seen today for occupational therapy evaluation for UE dysfunction s/p history of CVA. Initiated AE recommendations for independent living with good acceptance  of information and suggestions. Patient currently planning to be in his own apartment in the next couple of week and will benefit from skilled OT services in the outpatient setting to work on impairments as noted at eval to help pt return to highest level of independence and safety with daily activities.    PERFORMANCE DEFICITS: in functional skills including ADLs, IADLs, coordination, dexterity, proprioception, sensation, tone, ROM, strength, pain, fascial restrictions, muscle spasms, flexibility, Fine motor control, Gross motor control, balance, decreased knowledge of precautions, decreased knowledge of use of DME, and UE functional use, cognitive skills including problem solving and safety awareness, and psychosocial skills including coping strategies, environmental adaptation, and routines and behaviors.   IMPAIRMENTS: are limiting patient from ADLs, rest and sleep, work, and leisure.   CO-MORBIDITIES: has co-morbidities such as HTN, CHF that affects occupational performance. Patient will benefit from skilled OT to address above impairments and improve overall function.  REHAB POTENTIAL: Good for established goals  PLAN:  OT FREQUENCY: 1-2x/week  OT DURATION: up to 6 weeks - only scheduling 4 weeks to begin  PLANNED INTERVENTIONS: 97535 self care/ADL training, 16109 therapeutic exercise, 97530 therapeutic activity, 97112 neuromuscular re-education, 97140 manual therapy, 97035 ultrasound, [id number] electrical stimulation (manual), 97014 electrical stimulation unattended, 97760 Orthotic Initial, 97763 Orthotic/Prosthetic subsequent, passive range of motion, coping strategies training, patient/family education, and DME and/or AE instructions  RECOMMENDED OTHER SERVICES: Maybe ST - pt reports some drooling recently  CONSULTED AND AGREED WITH PLAN OF CARE: Patient  PLAN FOR NEXT SESSION:   Complete PSFS Assess sensation/stereognosis Further assess AROM LUE and ataxia Reprint/update  HEP  Splint wrist cock up via [employer] (?)  Zora Hires, OT 07/11/2023, 7:51 PM

## 2023-07-11 NOTE — Telephone Encounter (Signed)
 Unable to lvm 2nd attempt by hf 07/11/23

## 2023-07-12 ENCOUNTER — Telehealth: Payer: Self-pay | Admitting: Family

## 2023-07-12 ENCOUNTER — Other Ambulatory Visit: Payer: Self-pay | Admitting: Family Medicine

## 2023-07-12 DIAGNOSIS — I10 Essential (primary) hypertension: Secondary | ICD-10-CM

## 2023-07-12 MED ORDER — LABETALOL HCL 100 MG PO TABS
100.0000 mg | ORAL_TABLET | Freq: Two times a day (BID) | ORAL | 0 refills | Status: DC
Start: 2023-07-12 — End: 2023-11-06

## 2023-07-12 NOTE — Telephone Encounter (Signed)
 Patient needs refill for labetalol  sent to pharmacy

## 2023-07-14 ENCOUNTER — Ambulatory Visit (INDEPENDENT_AMBULATORY_CARE_PROVIDER_SITE_OTHER)

## 2023-07-14 ENCOUNTER — Ambulatory Visit (HOSPITAL_COMMUNITY)
Admission: RE | Admit: 2023-07-14 | Discharge: 2023-07-14 | Disposition: A | Discharge status: Home / Self Care | Source: Ambulatory Visit

## 2023-07-14 ENCOUNTER — Encounter (HOSPITAL_COMMUNITY): Payer: Self-pay

## 2023-07-14 ENCOUNTER — Other Ambulatory Visit: Payer: Self-pay

## 2023-07-14 VITALS — BP 130/75 | HR 74 | Temp 98.7°F | Resp 20

## 2023-07-14 DIAGNOSIS — S42035A Nondisplaced fracture of lateral end of left clavicle, initial encounter for closed fracture: Secondary | ICD-10-CM | POA: Diagnosis not present

## 2023-07-14 DIAGNOSIS — M19012 Primary osteoarthritis, left shoulder: Secondary | ICD-10-CM | POA: Diagnosis not present

## 2023-07-14 DIAGNOSIS — M25512 Pain in left shoulder: Secondary | ICD-10-CM

## 2023-07-14 DIAGNOSIS — G8929 Other chronic pain: Secondary | ICD-10-CM

## 2023-07-14 MED ORDER — DICLOFENAC SODIUM 50 MG PO TBEC: 50.0000 mg | DELAYED_RELEASE_TABLET | Freq: Two times a day (BID) | ORAL | 1 refills | Status: AC

## 2023-07-14 MED ORDER — BACLOFEN 10 MG PO TABS: 10.0000 mg | ORAL_TABLET | Freq: Every evening | ORAL | 3 refills | Status: DC

## 2023-07-14 NOTE — Discharge Instructions (Addendum)
  1. Closed nondisplaced fracture of acromial end of left clavicle, initial encounter (Primary) - DG Shoulder Left x-ray performed in UC shows no dislocation to the left proximal humerus but does show closed nondisplaced left distal clavicle fracture. - Apply Shoulder Immobilizer/Sling for left shoulder/clavicle protection until follow-up with orthopedics - baclofen  (LIORESAL ) 10 MG tablet; Take 1 tablet (10 mg total) by mouth at bedtime.  Dispense: 90 each; Refill: 3 - diclofenac  (VOLTAREN ) 50 MG EC tablet; Take 1 tablet (50 mg total) by mouth 2 (two) times daily.  Dispense: 30 tablet; Refill: 1 - AMB referral to orthopedics for further evaluation and management of left distal clavicle fracture

## 2023-07-14 NOTE — ED Provider Notes (Signed)
 [facility]  Note:  This document was prepared using Dragon voice recognition software and may include unintentional dictation errors.  MRN: [id number] DOB: [birth year]  Subjective:   [name] is a 58 y.o. male presenting for left anterior shoulder pain since yesterday.  Patient reports that he lost his footing and fell into the wall at home after which he noticed pain to his shoulder and believes that the left shoulder may be dislocated.  Patient has past history of shoulder dislocations.  Patient states he has not had a dislocation in a while.  Patient is not taking any over-the-counter medication to treat symptoms.  Patient denies hitting his head or any loss of consciousness due to the fall.  No current facility-administered medications for this encounter.  Current Outpatient Medications:    diclofenac  (VOLTAREN ) 50 MG EC tablet, Take 1 tablet (50 mg total) by mouth 2 (two) times daily., Disp: 30 tablet, Rfl: 1   amLODipine  (NORVASC ) 2.5 MG tablet, Take 1 tablet (2.5 mg total) by mouth daily., Disp: 90 tablet, Rfl: 3   aspirin  EC 81 MG tablet, Take 81 mg by mouth daily. Swallow whole., Disp: , Rfl:    atorvastatin  (LIPITOR) 80 MG tablet, Take 1 tablet (80 mg total) by mouth daily., Disp: 90 tablet, Rfl: 1   baclofen  (LIORESAL ) 10 MG tablet, Take 1 tablet (10 mg total) by mouth at bedtime., Disp: 90 each, Rfl: 3   folic acid  (FOLVITE ) 1 MG tablet, Take 1 tablet by mouth once daily, Disp: 30 tablet, Rfl: 3   gabapentin  (NEURONTIN ) 300 MG capsule, Take 1 capsule (300 mg total) by mouth 2 (two) times daily AND 2 capsules (600 mg total) at bedtime., Disp: 360 capsule, Rfl: 3   labetalol  (NORMODYNE ) 100 MG tablet, Take 1 tablet (100 mg total) by mouth 2 (two) times daily., Disp: 180 tablet, Rfl: 0   levofloxacin  (LEVAQUIN ) 750 MG tablet, Take 750 mg by mouth daily. TAKE THE MORNING OF THE BIOPSY, Disp: , Rfl:    losartan  (COZAAR ) 50 MG tablet, Take 1 tablet (50 mg total) by  mouth daily., Disp: 90 tablet, Rfl: 3   Multiple Vitamins-Minerals (CERTAVITE/ANTIOXIDANTS) TABS, Take 1 tablet by mouth daily., Disp: 30 tablet, Rfl: 0   sildenafil (REVATIO) 20 MG tablet, Take 1-3 tablets by mouth as needed., Disp: , Rfl:    tadalafil (CIALIS) 5 MG tablet, Take 5 mg by mouth every morning., Disp: , Rfl:    tamsulosin  (FLOMAX ) 0.4 MG CAPS capsule, Take 0.4 mg by mouth daily at 6 (six) AM., Disp: , Rfl:    thiamine  100 MG tablet, Take 1 tablet (100 mg total) by mouth daily., Disp: 30 tablet, Rfl: 0   topiramate  (TOPAMAX ) 100 MG tablet, Take 1 tablet (100 mg total) by mouth 2 (two) times daily., Disp: 180 tablet, Rfl: 3   Allergies  Allergen Reactions   Tizanidine      Other Reaction(s): Angioedema of tongue   Tamsulosin      Other Reaction(s): Not available    Past Medical History:  Diagnosis Date   Anxiety    Cataract    removed left eye   Chronic combined systolic and diastolic CHF (congestive heart failure) (HCC)    EF 25-30%   Diastolic dysfunction    Dyslipidemia    Hyperlipidemia    Hypertension    Neuromuscular disorder (HCC)    neuropathy left foot   Nonischemic dilated cardiomyopathy (HCC)    felt secondary to HTN with no ischemia on nuclear stress  test 05/2012, EF 25-30%   Seizures (HCC)    last seizure 05-2020- on Keppra    Shortness of breath    Stroke [facility]) 05/2020     Past Surgical History:  Procedure Laterality Date   COLONOSCOPY WITH PROPOFOL  N/A 12/09/2020   Procedure: COLONOSCOPY WITH PROPOFOL ;  Surgeon: Leigh Elspeth SQUIBB, MD;  Location: [facility];  Service: Gastroenterology;  Laterality: N/A;   KNEE ARTHROSCOPY Left 04/13/2016   Procedure: ARTHROSCOPY KNEE WITH DEBRIDEMENT;  Surgeon: Maude LELON Right, MD;  Location: [facility];  Service: Orthopedics;  Laterality: Left;   KNEE ARTHROSCOPY WITH MEDIAL MENISECTOMY Right 04/13/2016   Procedure: KNEE ARTHROSCOPY WITH MEDIAL MENISECTOMY;  Surgeon: Maude LELON Right, MD;   Location: [facility];  Service: Orthopedics;  Laterality: LEFT not right knee   NASAL FRACTURE SURGERY     SHOULDER SURGERY Left     Family History  Problem Relation Age of Onset   Hypertension Mother        Passed away when she is 15 secondary to hip surgery complications   CAD Mother    Heart attack Mother    Hypertension Father    Diabetes Brother    Stroke Brother    Heart attack Maternal Grandmother    Colon cancer Neg Hx    Esophageal cancer Neg Hx    Pancreatic cancer Neg Hx    Stomach cancer Neg Hx    Colon polyps Neg Hx     Social History   Tobacco Use   Smoking status: Former    Types: Cigarettes    Passive exposure: Past   Smokeless tobacco: Never  Vaping Use   Vaping status: Never Used  Substance Use Topics   Alcohol  use: Not Currently   Drug use: Not Currently    Types: Cocaine, Marijuana, Oxycodone     ROS Refer to HPI for ROS details.  Objective:   Vitals: BP 130/75 (BP Location: Right Arm) Comment (BP Location): large cuff  Pulse 74   Temp 98.7 F (37.1 C) (Oral)   Resp 20   SpO2 97%   Physical Exam Vitals and nursing note reviewed.  Constitutional:      General: He is not in acute distress.    Appearance: Normal appearance. He is well-developed. He is not ill-appearing or toxic-appearing.  HENT:     Head: Normocephalic.   Cardiovascular:     Rate and Rhythm: Normal rate.  Pulmonary:     Effort: Pulmonary effort is normal. No respiratory distress.   Musculoskeletal:     Left shoulder: Swelling, deformity (Mild anterior shoulder deformity when compared to right shoulder, possibly due to dislocation.), tenderness and bony tenderness present. No crepitus. Decreased range of motion. Decreased strength. Normal pulse.   Skin:    General: Skin is warm and dry.     Capillary Refill: Capillary refill takes less than 2 seconds.   Neurological:     General: No focal deficit present.     Mental Status: He is alert and oriented  to person, place, and time.   Psychiatric:        Mood and Affect: Mood normal.        Behavior: Behavior normal.     Procedures  No results found for this or any previous visit (from the past 24 hours).  DG Shoulder Left Result Date: 07/14/2023 CLINICAL DATA:  Status post fall with left shoulder pain EXAM: LEFT SHOULDER - 3 VIEW COMPARISON:  None Available. FINDINGS: Nondisplaced transverse fracture of  the distal clavicle. No acute dislocation. Degenerative changes of the glenohumeral and acromioclavicular joints. Soft tissues are unremarkable. IMPRESSION: Nondisplaced transverse fracture of the distal clavicle. Electronically Signed   By: Limin  Xu M.D.   On: 07/14/2023 15:07     Assessment and Plan :     Discharge Instructions       1. Closed nondisplaced fracture of acromial end of left clavicle, initial encounter (Primary) - DG Shoulder Left x-ray performed in UC shows no dislocation to the left proximal humerus but does show closed nondisplaced left distal clavicle fracture. - Apply Shoulder Immobilizer/Sling for left shoulder/clavicle protection until follow-up with orthopedics - baclofen  (LIORESAL ) 10 MG tablet; Take 1 tablet (10 mg total) by mouth at bedtime.  Dispense: 90 each; Refill: 3 - diclofenac  (VOLTAREN ) 50 MG EC tablet; Take 1 tablet (50 mg total) by mouth 2 (two) times daily.  Dispense: 30 tablet; Refill: 1 - AMB referral to orthopedics for further evaluation and management of left distal clavicle fracture      Jakevion Arney B Aurea Aurea, Inigo Lantigua B, NP 07/14/23 1527

## 2023-07-14 NOTE — ED Triage Notes (Signed)
 Left shoulder pain.  History of dislocations.  Patient lost his footing yesterday and hit wall into wall.  Today has pain.  Does not feel left shoulder looks normal  Pain is in anterior shoulder.  Patient has a complex history.  Patient reports this shoulder used to pop out frequently this is patient's concern today.  Reports having had 3 cortisone shots over the past.

## 2023-07-17 ENCOUNTER — Ambulatory Visit: Admitting: Occupational Therapy

## 2023-07-17 ENCOUNTER — Telehealth: Payer: Self-pay | Admitting: Occupational Therapy

## 2023-07-17 DIAGNOSIS — C61 Malignant neoplasm of prostate: Secondary | ICD-10-CM | POA: Diagnosis not present

## 2023-07-17 NOTE — Telephone Encounter (Signed)
 This is to document my attempt to call patient after no-show for OT appt this PM.  This is patient's # 1st missed appt.   Primary and Alternate Contact phone number(s) were used in efforts to contact the patient.   Unable to leave message. Unable to verify identity of pt on primary number VM and no name associated with alternate contact VM to verify identity either.  Therefore no messages left .   Upon review of medical records - it is noted that pt went to UC this weekend s/p stumble/fall with dx of fractured clavicle with sling applied and MD follow up recommended with Ortho.  OTR assumes this is the issue for no-show and pt has indicated in the past that he does not have a cell phone.  Will attempt to reach out again this week to verify future appts.

## 2023-07-23 ENCOUNTER — Ambulatory Visit: Admitting: Occupational Therapy

## 2023-07-23 ENCOUNTER — Telehealth: Payer: Self-pay | Admitting: Radiation Oncology

## 2023-07-23 NOTE — Telephone Encounter (Signed)
Left message for patient to call back to schedule consult per 6/26 referral.

## 2023-07-24 ENCOUNTER — Telehealth: Payer: Self-pay | Admitting: Radiation Oncology

## 2023-07-24 NOTE — Telephone Encounter (Signed)
Left message for patient to call back to schedule consult per 6/26 referral.

## 2023-07-25 ENCOUNTER — Ambulatory Visit: Attending: Orthopedic Surgery | Admitting: Occupational Therapy

## 2023-07-25 ENCOUNTER — Telehealth: Payer: Self-pay | Admitting: Radiation Oncology

## 2023-07-25 NOTE — Telephone Encounter (Signed)
Left message for patient to call back to schedule consult per 6/26 referral.

## 2023-07-26 ENCOUNTER — Telehealth: Payer: Self-pay | Admitting: Radiation Oncology

## 2023-07-26 NOTE — Telephone Encounter (Signed)
 Mailed letter for patient to call back to schedule consult per 6/26 referral.

## 2023-08-06 ENCOUNTER — Encounter: Attending: Physical Medicine and Rehabilitation | Admitting: Physical Medicine and Rehabilitation

## 2023-08-06 VITALS — BP 124/71 | HR 74 | Ht 69.5 in | Wt 194.0 lb

## 2023-08-06 DIAGNOSIS — E663 Overweight: Secondary | ICD-10-CM | POA: Insufficient documentation

## 2023-08-06 DIAGNOSIS — S42002A Fracture of unspecified part of left clavicle, initial encounter for closed fracture: Secondary | ICD-10-CM | POA: Insufficient documentation

## 2023-08-06 DIAGNOSIS — I10 Essential (primary) hypertension: Secondary | ICD-10-CM | POA: Insufficient documentation

## 2023-08-06 DIAGNOSIS — E782 Mixed hyperlipidemia: Secondary | ICD-10-CM | POA: Insufficient documentation

## 2023-08-06 DIAGNOSIS — I639 Cerebral infarction, unspecified: Secondary | ICD-10-CM | POA: Diagnosis not present

## 2023-08-06 MED ORDER — GABAPENTIN 300 MG PO CAPS: ORAL_CAPSULE | ORAL | 3 refills | Status: AC

## 2023-08-06 MED ORDER — ATORVASTATIN CALCIUM 80 MG PO TABS: 80.0000 mg | ORAL_TABLET | Freq: Every day | ORAL | 1 refills | Status: DC

## 2023-08-06 MED ORDER — LOSARTAN POTASSIUM 25 MG PO TABS: 25.0000 mg | ORAL_TABLET | Freq: Every day | ORAL | 3 refills | Status: AC

## 2023-08-06 NOTE — Progress Notes (Signed)
 Subjective:    Patient ID: [name], male    DOB: Feb 27, 1965, 58 y.o.   MRN: [id number]  HPI [name] is a 58 year old man who presents for follow-up of CVA  1) Overweight: -has lost a lot of weight! -he is scared to eat his wife's food  -discussed that weight is down to 192  lbs -tries to do 10 laps back and forth -he now does 17 laps  2) Problems with spouse -his divorce is going to be finalized next week -he got accepted in a two bedroom -under adult protective services now -she has been hacking into his phone.  -his wife has a new boyfriend and he thinks this has influenced her -his ex forged his signature with the IRS  -discussed that his wife came looking for him at the [place] [facility] and his [facility] is supportive of him.  -his wife hacked his phone  3) CVA -he is doing well -he asks for refill of gabapentin  for his neuropathy -he is walking -using the cane -nerve pain has been stable  4) HTN: -has been off BP meds since 12/19  5) Neuropathy: -needs refill of gabapentin  -was told that his nerves are good.   6) Left shoulder pain: -has been following with orthopedic and has been treated with steroid injections  7) Right knee pain -felt a pop when he was going to the dining hall at the [place] [facility] -has lost power when he is trying to go up steps -they told him in therapy that they may have worn out his right side.  -still hurting -voltaren  gel is not helping, he is only using at night  8) Left wrist pain: -he would like to see ortho for a new brace  9) Left clavicle fracture: -swiveled off chair onto concrete -went to the gym and walked for a mile and went back to the center and saw that something did not look right in his shoulder -the next morning he went to urgent care and was found that he fractured left clavicle -she was given a stabilizer which he has to wear for 1 month and no activity 4-6 weeks    Pain Inventory Average Pain 7 Pain Right Now 7 My  pain is intermittent and aching  LOCATION OF PAIN  Shoulder, Hand, Fingers, Leg, Toes  BOWEL Number of stools per week: 7   BLADDER Normal Flow not steady    Mobility use a cane how many minutes can you walk? 30 ability to climb steps?  yes do you drive?  yes Do you have any goals in this area?  yes  Function disabled: date disabled . I need assistance with the following:  meal prep and household duties  Neuro/Psych numbness tingling anxiety  Prior Studies Any changes since last visit?  yes CT/MRI  Physicians involved in your care Any changes since last visit?  no   Family History  Problem Relation Age of Onset   Hypertension Mother        Passed away when she is 47 secondary to hip surgery complications   CAD Mother    Heart attack Mother    Hypertension Father    Diabetes Brother    Stroke Brother    Heart attack Maternal Grandmother    Colon cancer Neg Hx    Esophageal cancer Neg Hx    Pancreatic cancer Neg Hx    Stomach cancer Neg Hx    Colon polyps Neg Hx  Social History   Socioeconomic History   Marital status: Married    Spouse name: Not on file   Number of children: Not on file   Years of education: Not on file   Highest education level: Not on file  Occupational History   Not on file  Tobacco Use   Smoking status: Former    Types: Cigarettes    Passive exposure: Past   Smokeless tobacco: Never  Vaping Use   Vaping status: Never Used  Substance and Sexual Activity   Alcohol  use: Not Currently   Drug use: Not Currently    Types: Cocaine, Marijuana, Oxycodone    Sexual activity: Yes  Other Topics Concern   Not on file  Social History Narrative   Not on file   Social Drivers of Health   Financial Resource Strain: Not on file  Food Insecurity: Not on file  Transportation Needs: Not on file  Physical Activity: Not on file  Stress: Not on file  Social Connections: Unknown (06/07/2021)   Received from [facility]   Social  Network    Social Network: Not on file   Past Surgical History:  Procedure Laterality Date   COLONOSCOPY WITH PROPOFOL  N/A 12/09/2020   Procedure: COLONOSCOPY WITH PROPOFOL ;  Surgeon: Leigh Elspeth SQUIBB, MD;  Location: [facility];  Service: Gastroenterology;  Laterality: N/A;   KNEE ARTHROSCOPY Left 04/13/2016   Procedure: ARTHROSCOPY KNEE WITH DEBRIDEMENT;  Surgeon: Maude LELON Right, MD;  Location: [facility];  Service: Orthopedics;  Laterality: Left;   KNEE ARTHROSCOPY WITH MEDIAL MENISECTOMY Right 04/13/2016   Procedure: KNEE ARTHROSCOPY WITH MEDIAL MENISECTOMY;  Surgeon: Maude LELON Right, MD;  Location: [facility];  Service: Orthopedics;  Laterality: LEFT not right knee   NASAL FRACTURE SURGERY     SHOULDER SURGERY Left    Past Medical History:  Diagnosis Date   Anxiety    Cataract    removed left eye   Chronic combined systolic and diastolic CHF (congestive heart failure) (HCC)    EF 25-30%   Diastolic dysfunction    Dyslipidemia    Hyperlipidemia    Hypertension    Neuromuscular disorder (HCC)    neuropathy left foot   Nonischemic dilated cardiomyopathy (HCC)    felt secondary to HTN with no ischemia on nuclear stress test 05/2012, EF 25-30%   Seizures (HCC)    last seizure 05-2020- on Keppra    Shortness of breath    Stroke (HCC) 05/2020   There were no vitals taken for this visit.  Opioid Risk Score:   Fall Risk Score:  `1  Depression screen PHQ 2/9     01/29/2023    1:24 PM 10/24/2022    1:18 PM 08/07/2022   11:12 AM 03/10/2022   10:40 AM 02/20/2022    1:40 PM 11/22/2021   10:27 AM 09/14/2021    2:14 PM  Depression screen PHQ 2/9  Decreased Interest 0 0 0 0 0 0 0  Down, Depressed, Hopeless 0 0 0 0 1 0 0  PHQ - 2 Score 0 0 0 0 1 0 0  Altered sleeping     0 0   Tired, decreased energy     0 0   Change in appetite     1 0   Feeling bad or failure about yourself      0 0   Trouble concentrating     0 0   Moving slowly or  fidgety/restless     0 0  Suicidal thoughts     0 0   PHQ-9 Score     2 0   Difficult doing work/chores     Somewhat difficult Not difficult at all     Gen: no distress, normal appearing HEENT: oral mucosa pink and moist, NCAT Cardio: Reg rate Chest: normal effort, normal rate of breathing Abd: soft, non-distended Ext: no edema Psych: pleasant, normal affect Skin: intact MSK: Left hand and foot weakness. Ambulating with cane, left arm in sling Right knee TTP, left wrist brace     Family History  Problem Relation Age of Onset   Hypertension Mother        Passed away when she is 65 secondary to hip surgery complications   CAD Mother    Heart attack Mother    Hypertension Father    Diabetes Brother    Stroke Brother    Heart attack Maternal Grandmother    Colon cancer Neg Hx    Esophageal cancer Neg Hx    Pancreatic cancer Neg Hx    Stomach cancer Neg Hx    Colon polyps Neg Hx    Social History   Socioeconomic History   Marital status: Married    Spouse name: Not on file   Number of children: Not on file   Years of education: Not on file   Highest education level: Not on file  Occupational History   Not on file  Tobacco Use   Smoking status: Former    Types: Cigarettes    Passive exposure: Past   Smokeless tobacco: Never  Vaping Use   Vaping status: Never Used  Substance and Sexual Activity   Alcohol  use: Not Currently   Drug use: Not Currently    Types: Cocaine, Marijuana, Oxycodone    Sexual activity: Yes  Other Topics Concern   Not on file  Social History Narrative   Not on file   Social Drivers of Health   Financial Resource Strain: Not on file  Food Insecurity: Not on file  Transportation Needs: Not on file  Physical Activity: Not on file  Stress: Not on file  Social Connections: Unknown (06/07/2021)   Received from [facility]   Social Network    Social Network: Not on file   Past Surgical History:  Procedure Laterality Date    COLONOSCOPY WITH PROPOFOL  N/A 12/09/2020   Procedure: COLONOSCOPY WITH PROPOFOL ;  Surgeon: Leigh Elspeth SQUIBB, MD;  Location: [facility];  Service: Gastroenterology;  Laterality: N/A;   KNEE ARTHROSCOPY Left 04/13/2016   Procedure: ARTHROSCOPY KNEE WITH DEBRIDEMENT;  Surgeon: Maude LELON Right, MD;  Location: [facility];  Service: Orthopedics;  Laterality: Left;   KNEE ARTHROSCOPY WITH MEDIAL MENISECTOMY Right 04/13/2016   Procedure: KNEE ARTHROSCOPY WITH MEDIAL MENISECTOMY;  Surgeon: Maude LELON Right, MD;  Location: [facility];  Service: Orthopedics;  Laterality: LEFT not right knee   NASAL FRACTURE SURGERY     SHOULDER SURGERY Left    Past Medical History:  Diagnosis Date   Anxiety    Cataract    removed left eye   Chronic combined systolic and diastolic CHF (congestive heart failure) (HCC)    EF 25-30%   Diastolic dysfunction    Dyslipidemia    Hyperlipidemia    Hypertension    Neuromuscular disorder (HCC)    neuropathy left foot   Nonischemic dilated cardiomyopathy (HCC)    felt secondary to HTN with no ischemia on nuclear stress test 05/2012, EF 25-30%   Seizures (HCC)  last seizure 05-2020- on Keppra    Shortness of breath    Stroke (HCC) 05/2020   There were no vitals taken for this visit.  Opioid Risk Score:   Fall Risk Score:  `1  Depression screen PHQ 2/9     01/29/2023    1:24 PM 10/24/2022    1:18 PM 08/07/2022   11:12 AM 03/10/2022   10:40 AM 02/20/2022    1:40 PM 11/22/2021   10:27 AM 09/14/2021    2:14 PM  Depression screen PHQ 2/9  Decreased Interest 0 0 0 0 0 0 0  Down, Depressed, Hopeless 0 0 0 0 1 0 0  PHQ - 2 Score 0 0 0 0 1 0 0  Altered sleeping     0 0   Tired, decreased energy     0 0   Change in appetite     1 0   Feeling bad or failure about yourself      0 0   Trouble concentrating     0 0   Moving slowly or fidgety/restless     0 0   Suicidal thoughts     0 0   PHQ-9 Score     2 0   Difficult doing  work/chores     Somewhat difficult Not difficult at all     Review of Systems  Musculoskeletal:  Positive for gait problem.       Pain in Shoulder, Hand, Fingers, Leg, Toes  Neurological:        Tingling  Psychiatric/Behavioral:         Anxiety  All other systems reviewed and are negative.      Assessment & Plan:   [name] is a 58 year old man presenting today for f/u of left hemiplegia, right sided low back pain, and left sided foot pain following stroke, left shoulder pain.  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  -refilled atorvastatin  -continue daily exercise at [facility] -refilled atorvastatin  for mixed HLD -discussed that he is going to the gym, continue going 6 days per week.  -discussed that he has a good social community at [employer], the gym, and the [place] -referred to PCP -referred to PT --progress to quad walker -face to face for AFO performed -discussed that most motor improvement occurs in the first 4 weeks, but improvement is still seen in the first 6 months, and some improvement may still be seen in the first 2 weeks -evaluated for vagal nerve stimulation trial for unfortunately will not qualify due to his stroke being hemorrhagic -will send him information about deep brain stimulation, discussed with him that I have seen this help one of my patients -Incredible improvement in ambulation!  -discussed that he is doing 10 laps daily and feeling great -refilled gabapentin  -graduated from therapy -Neuropathy is moving up to neck.  -was released from the hospital one year today -she is doing exercises.  -left hand is getting on his nerves -he thinks -his therapists are planning to take him to the pool and told him this may help with his arm pain -pain has been severe at home, as it had been in hospital -his wife purchased 500mg  tablets of tylenol  -he is worried about his decreased upper  extremity strength and asks if this will ever improve -he is not experiencing much shoulder pain right now -he does have some strength -the benefits from the trigger point injections lwore  off within 1 week -pain is severely limiting his function -he is taking Tizanidine  and it does not help, does make him sleepy, but his wife is trying to encourage him to stay up during the day -he developed the foot pain a couple of weeks ago -he was wearing a boot at that time so took it off but has still had pain a couple of weeks afterward. -he has not tried anything for this pain -it is worst at night and feels like burning and tingling at his heel. -he has been doing great with therapy and is walking in his home! -his wife asks if he could get a handicap placard.  -the foot is still feeling numb with pins and needles, also painful. He is unsure whether Gabapentin  helps- so far nothing he as tried has helped his neuropathy. His balance is also now off and he has been stumbling more. He actually felt that the Gabapentin  made his pain worse. He has never tried Cymbalta  but is willing to try it.  -Has been able to use bathroom himself! -he is working with therapy right now.  -he was feeling wobbly over the weekend so had to miss his PT session -shoulder is feeling very numb and cannot make a fist -ready to proceed with Qutenza  today -His wife asks for refill of Eucerin and Diclofenac  and Tizanidine .  -She asks whether he should still be taking Seroquel  -he called medical records and records were faxed on December 28th.  -He called our office today  -He needs medical records to process his disability claims -able to walk much better.  -his wife mentioned to me that she is still worried about him driving. He has been driving around locally to pharmacy   2) Lower extremity edema -worsening -not painful -he asks when his next appointment with me is.  -has gotten used to it -therapist was  concerned -painful -sometimes feels cold on the left foot -never took the fluid pills -he is hoping to walk more now that the weather has improved D/c amlodipine   3) Headaches -started last week and felt loss of balance -this stopped him from doing therapy.  -still having tension in head.  -he takes topamax  25mg  HS.   4) Obesity -he has lost 64 lbs! -discussed that current weight is 186 lbs  5) Left shoulder pain -intermittent -sometimes he uses voltaren  gel and this seems to help the most -discussed that he has been following with ortho and benefitting from injections -discussed that he has 2 hearings coming up regarding his shoulder.   6) Left upper extremity neuropathy -started last month -received steroid injection 2 months ago. Asks if this is a side effect of the steroid injection -asks about a supplement and if this is ok to take -he asks if there is anything else he can do for this -refilled gabapentin   7) Cervical myofascial pain syndrome -he wants to know if injections could help  8) Neuropathy is getting worse -he gets depressed about this.  -refilled gabapentin  300mg  TID  9) Urinary frequency: -notes that this started after taking the Baclofen , but his wife said she never gave it to him -he did take the cymbalta  but stopped due to this potential side effect -he is urinating every 5-10 minutes -he is willing to get UA and UC drawn today.  Refilled flomax   10) Problems with wife -discussed that he has a  -discussed that his divorce will be finalized next week -he believes his wife is trying  to tell his healthcare providers he has dementia -he also feels that one day his cane was filled with water to make it more difficult for him to use and that she did this, he says this was witnessed by his therapy team -his family is unaware of this as they have moved and he has not yet discussed with them -he would like his medical records to be restricted from his  wife.  -he asks if I can help him find alternative housing -discussed that the [place] is giving him good support -discussed that his wife is tracking to hack his phone.  -discussed that his wife was falsely saying that she had dementia. -discussed that he ran into her at the bank.  -discussed that step daughter teamed up with her    37) HTN: -d/c amlodipine  --refilled losartan  and tamsulosin  -d/c tizanidine  -decrease losartan  to 25mg  daily -Advised checking BP daily at home and logging results to bring into follow-up appointment with PCP and myself. -Reviewed BP meds today.  -Advised regarding healthy foods that can help lower blood pressure and provided with a list: 1) citrus foods- high in vitamins and minerals 2) salmon and other fatty fish - reduces inflammation and oxylipins 3) swiss chard (leafy green)- high level of nitrates 4) pumpkin seeds- one of the best natural sources of magnesium 5) Beans and lentils- high in fiber, magnesium, and potassium 6) Berries- high in flavonoids 7) Amaranth (whole grain, can be cooked similarly to rice and oats)- high in magnesium and fiber 8) Pistachios- even more effective at reducing BP than other nuts 9) Carrots- high in phenolic compounds that relax blood vessels and reduce inflammation 10) Celery- contain phthalides that relax tissues of arterial walls 11) Tomatoes- can also improve cholesterol and reduce risk of heart disease 12) Broccoli- good source of magnesium, calcium , and potassium 13) Greek yogurt: high in potassium and calcium  14) Herbs and spices: Celery seed, cilantro, saffron, lemongrass, black cumin, ginseng, cinnamon, cardamom, sweet basil, and ginger 15) Chia and flax seeds- also help to lower cholesterol and blood sugar 16) Beets- high levels of nitrates that relax blood vessels  17) spinach and bananas- high in potassium  -Provided lise of supplements that can help with hypertension:  1) magnesium: one high quality brand is  Bioptemizers since it contains all 7 types of magnesium, otherwise over the counter magnesium gluconate 400mg  is a good option 2) B vitamins 3) vitamin D 4) potassium 5) CoQ10 6) L-arginine 7) Vitamin C 8) Beetroot -Educated that goal BP is 120/80. -Made goal to incorporate some of the above foods into diet.    12) Left hand weakness: -ordered OT, continue HEP  13) Tongue swelling:  -d/c tizanidine   14) Right knee pain:  -XR ordered, discussed spurring -recommended neoprene knee sleeve  15) Left wrist pain: -continue brace -referred to Dr. Erwin  16. Elevated PSA: -discussed that he had a biopsy and the results are pending -discussed stress reduction, meditation, avoiding added sugars, eating fruits and vegetables  17. Edema:  -discussed to monitor for shortness of breath -continue walking   18. Left clavicle fracture: -continue sling

## 2023-08-07 DIAGNOSIS — M19012 Primary osteoarthritis, left shoulder: Secondary | ICD-10-CM | POA: Diagnosis not present

## 2023-08-13 NOTE — Progress Notes (Incomplete)
 GU Location of Tumor / Histology: Prostate Ca  If Prostate Cancer, Gleason Score is (3 + 4) and PSA is (5.18 on 01/25/2023)  [name] presented as referral from Dr.  Donnice Brooks [facility]) elevated PSA.  Biopsies      Past/Anticipated interventions by urology, if any: NA  Past/Anticipated interventions by medical oncology, if any: NA  Weight changes, if any: {:18581}  IPSS: SHIM:  Bowel/Bladder complaints, if any: {:18581}   Nausea/Vomiting, if any: {:18581}  Pain issues, if any:  {:18581}  SAFETY ISSUES: Prior radiation? {:18581} Pacemaker/ICD? {:18581} Possible current pregnancy? Male Is the patient on methotrexate? No  Current Complaints / other details:

## 2023-08-15 NOTE — Progress Notes (Signed)
 Radiation Oncology         [phone] ________________________________  Initial Outpatient Consultation  Name: [name] MRN: [id number]  Date: 08/16/2023  DOB: 23-Oct-1965  RR:Duzeyzwd, Greig PARAS, NP  Renda Glance, MD   REFERRING PHYSICIAN: Renda Glance, MD  DIAGNOSIS: 58 y.o. gentleman with Stage T1c adenocarcinoma of the prostate with Gleason score of 4+3, and PSA of 5.18.    ICD-10-CM   1. Malignant neoplasm of prostate (HCC)  C61      HISTORY OF PRESENT ILLNESS: [name] is a 58 year old gentleman referred for consultation following a recent diagnosis of prostate cancer. He initially presented with an elevated PSA of 5.18, prompting further evaluation with prostate MRI on 10/01/2022, which identified a 1.7 cm PI-RADS 5 lesion in the right apical peripheral zone. Targeted and systematic MRI/US  fusion-guided biopsy on 06/01/2023 revealed Gleason 4+3=7 (Grade Group 3) adenocarcinoma in 6 out of 15 cores, with only one core containing Gleason 3+3=6 disease. TRUS volume was 35.8 cc.  Mr. Vacha has a paternal family history of prostate cancer--his father was treated with radiation seed implantation and is alive and well at age 2.  His past medical history is notable for cerebrovascular accident (CVA) in March 2022, attributed to hypertension, resulting in a hemorrhagic stroke. He is followed by neurology and cardiology for associated sequelae, including glaucoma, coronary artery disease, gout, hyperlipidemia, and congestive heart failure. His ejection fraction had previously been in the 20-25% range but improved to 55-60% on follow-up echocardiography in August 2024.  He has not undergone any prior abdominal surgeries.  The patient reviewed the biopsy results with his urologist and he has kindly been referred today for discussion of potential radiation treatment options.  PREVIOUS RADIATION THERAPY: No  PAST MEDICAL HISTORY:  Past Medical History:  Diagnosis Date   Anxiety     Cataract    removed left eye   Chronic combined systolic and diastolic CHF (congestive heart failure) (HCC)    EF 25-30%   Diastolic dysfunction    Dyslipidemia    Hyperlipidemia    Hypertension    Neuromuscular disorder (HCC)    neuropathy left foot   Nonischemic dilated cardiomyopathy (HCC)    felt secondary to HTN with no ischemia on nuclear stress test 05/2012, EF 25-30%   Seizures (HCC)    last seizure 05-2020- on Keppra    Shortness of breath    Stroke (HCC) 05/2020      PAST SURGICAL HISTORY: Past Surgical History:  Procedure Laterality Date   COLONOSCOPY WITH PROPOFOL  N/A 12/09/2020   Procedure: COLONOSCOPY WITH PROPOFOL ;  Surgeon: Leigh Elspeth SQUIBB, MD;  Location: [facility];  Service: Gastroenterology;  Laterality: N/A;   KNEE ARTHROSCOPY Left 04/13/2016   Procedure: ARTHROSCOPY KNEE WITH DEBRIDEMENT;  Surgeon: Maude LELON Right, MD;  Location: [facility];  Service: Orthopedics;  Laterality: Left;   KNEE ARTHROSCOPY WITH MEDIAL MENISECTOMY Right 04/13/2016   Procedure: KNEE ARTHROSCOPY WITH MEDIAL MENISECTOMY;  Surgeon: Maude LELON Right, MD;  Location: [facility];  Service: Orthopedics;  Laterality: LEFT not right knee   NASAL FRACTURE SURGERY     SHOULDER SURGERY Left     FAMILY HISTORY:  Family History  Problem Relation Age of Onset   Hypertension Mother        Passed away when she is 75 secondary to hip surgery complications   CAD Mother    Heart attack Mother    Hypertension Father    Diabetes Brother  Stroke Brother    Heart attack Maternal Grandmother    Colon cancer Neg Hx    Esophageal cancer Neg Hx    Pancreatic cancer Neg Hx    Stomach cancer Neg Hx    Colon polyps Neg Hx     SOCIAL HISTORY:  Social History   Socioeconomic History   Marital status: Married    Spouse name: Not on file   Number of children: Not on file   Years of education: Not on file   Highest education level: Not on file  Occupational  History   Not on file  Tobacco Use   Smoking status: Former    Types: Cigarettes    Passive exposure: Past   Smokeless tobacco: Never  Vaping Use   Vaping status: Never Used  Substance and Sexual Activity   Alcohol  use: Not Currently   Drug use: Not Currently    Types: Cocaine, Marijuana, Oxycodone    Sexual activity: Yes  Other Topics Concern   Not on file  Social History Narrative   Not on file   Social Drivers of Health   Financial Resource Strain: Not on file  Food Insecurity: Not on file  Transportation Needs: Not on file  Physical Activity: Not on file  Stress: Not on file  Social Connections: Unknown (06/07/2021)   Received from [facility]   Social Network    Social Network: Not on file  Intimate Partner Violence: Unknown (04/28/2021)   Received from [facility]   HITS    Physically Hurt: Not on file    Insult or Talk Down To: Not on file    Threaten Physical Harm: Not on file    Scream or Curse: Not on file    ALLERGIES: Tamsulosin  hcl, Tizanidine , and Tamsulosin   MEDICATIONS:  Current Outpatient Medications  Medication Sig Dispense Refill   amLODipine  (NORVASC ) 2.5 MG tablet Take 1 tablet (2.5 mg total) by mouth daily. 90 tablet 3   aspirin  EC 81 MG tablet Take 81 mg by mouth daily. Swallow whole.     atorvastatin  (LIPITOR) 80 MG tablet Take 1 tablet (80 mg total) by mouth daily. 90 tablet 1   baclofen  (LIORESAL ) 10 MG tablet Take 1 tablet (10 mg total) by mouth at bedtime. 90 each 3   diclofenac  (VOLTAREN ) 50 MG EC tablet Take 1 tablet (50 mg total) by mouth 2 (two) times daily. 30 tablet 1   folic acid  (FOLVITE ) 1 MG tablet Take 1 tablet by mouth once daily 30 tablet 3   gabapentin  (NEURONTIN ) 300 MG capsule Take 1 capsule (300 mg total) by mouth 2 (two) times daily AND 2 capsules (600 mg total) at bedtime. 360 capsule 3   labetalol  (NORMODYNE ) 100 MG tablet Take 1 tablet (100 mg total) by mouth 2 (two) times daily. 180 tablet 0   levofloxacin   (LEVAQUIN ) 750 MG tablet Take 750 mg by mouth daily. TAKE THE MORNING OF THE BIOPSY     losartan  (COZAAR ) 25 MG tablet Take 1 tablet (25 mg total) by mouth daily. 90 tablet 3   Multiple Vitamins-Minerals (CERTAVITE/ANTIOXIDANTS) TABS Take 1 tablet by mouth daily. 30 tablet 0   sildenafil (REVATIO) 20 MG tablet Take 1-3 tablets by mouth as needed.     tadalafil (CIALIS) 5 MG tablet Take 5 mg by mouth every morning.     tamsulosin  (FLOMAX ) 0.4 MG CAPS capsule Take 0.4 mg by mouth daily at 6 (six) AM.     thiamine  100 MG tablet Take 1 tablet (  100 mg total) by mouth daily. 30 tablet 0   topiramate  (TOPAMAX ) 100 MG tablet Take 1 tablet (100 mg total) by mouth 2 (two) times daily. 180 tablet 3   No current facility-administered medications for this visit.    REVIEW OF SYSTEMS:  On review of systems, the patient reports that he is doing well overall. He denies any chest pain, shortness of breath, cough, fevers, chills, night sweats, unintended weight changes. He denies any bowel disturbances, and denies abdominal pain, nausea or vomiting. He denies any new musculoskeletal or joint aches or pains. His IPSS was ***, indicating *** urinary symptoms. His SHIM was ***, indicating he {does not have/has mild/moderate/severe} erectile dysfunction. A complete review of systems is obtained and is otherwise negative.    PHYSICAL EXAM:  Wt Readings from Last 3 Encounters:  08/06/23 194 lb (88 kg)  06/11/23 198 lb (89.8 kg)  05/28/23 193 lb 12.8 oz (87.9 kg)   Temp Readings from Last 3 Encounters:  07/14/23 98.7 F (37.1 C) (Oral)  02/26/23 98.4 F (36.9 C) (Oral)  11/24/22 98.6 F (37 C) (Oral)   BP Readings from Last 3 Encounters:  08/06/23 124/71  07/14/23 130/75  06/11/23 125/74   Pulse Readings from Last 3 Encounters:  08/06/23 74  07/14/23 74  06/11/23 69    /10  In general this is a well appearing *** male in no acute distress. He's alert and oriented x4 and appropriate throughout the  examination. Cardiopulmonary assessment is negative for acute distress, and he exhibits normal effort.     KPS = ***  100 - Normal; no complaints; no evidence of disease. 90   - Able to carry on normal activity; minor signs or symptoms of disease. 80   - Normal activity with effort; some signs or symptoms of disease. 50   - Cares for self; unable to carry on normal activity or to do active work. 60   - Requires occasional assistance, but is able to care for most of his personal needs. 50   - Requires considerable assistance and frequent medical care. 40   - Disabled; requires special care and assistance. 30   - Severely disabled; hospital admission is indicated although death not imminent. 20   - Very sick; hospital admission necessary; active supportive treatment necessary. 10   - Moribund; fatal processes progressing rapidly. 0     - Dead  Karnofsky DA, Abelmann WH, Craver LS and Burchenal [facility] [phone]) The use of the nitrogen mustards in the palliative treatment of carcinoma: with particular reference to bronchogenic carcinoma Cancer 1 634-56  LABORATORY DATA:  Lab Results  Component Value Date   WBC 4.7 11/23/2021   HGB 13.9 11/23/2021   HCT 42.0 11/23/2021   MCV 92 11/23/2021   PLT 314 11/23/2021   Lab Results  Component Value Date   NA 141 12/27/2022   K 4.1 12/27/2022   CL 104 12/27/2022   CO2 22 12/27/2022   Lab Results  Component Value Date   ALT 24 11/09/2022   AST 22 11/09/2022   ALKPHOS 75 11/09/2022   BILITOT 0.4 11/09/2022     RADIOGRAPHY: No results found.    IMPRESSION/PLAN: 1. 58 y.o. gentleman with Stage T1c adenocarcinoma of the prostate with Gleason score of 4+3, and PSA of 5.18. We discussed the patient's workup and outlined the nature of prostate cancer in this setting. The patient's T stage, Gleason's score, and PSA put him into the Unfavorable Intermediate Risk (UIR) group. Accordingly,  he is eligible for a variety of potential treatment options  including brachytherapy, 5.5 weeks of external radiation, or prostatectomy. We discussed the available radiation techniques, and focused on the details and logistics of delivery.  We discussed and outlined the risks, benefits, short and long-term effects associated with radiotherapy and compared and contrasted these with prostatectomy. We discussed the role of SpaceOAR gel in reducing the rectal toxicity associated with radiotherapy. We also detailed the role of ST-ADT in the treatment of UIR prostate cancer and outlined the associated side effects that could be expected with this therapy.  He appears to have a good understanding of his disease and our treatment recommendations which are of curative intent.  He was encouraged to ask questions that were answered to his stated satisfaction.  At the conclusion of our conversation, the patient is interested in moving forward with ***.  We personally spent *** minutes in this encounter including chart review, reviewing radiological studies, meeting face-to-face with the patient, entering orders and completing documentation.      Donnice Barge, MD  [facility]  Radiation Oncology Direct Dial: [phone]  Fax: [phone] [email or web address]  Skype  [email or web address]

## 2023-08-16 ENCOUNTER — Telehealth: Payer: Self-pay | Admitting: Neurology

## 2023-08-16 ENCOUNTER — Ambulatory Visit: Admitting: Neurology

## 2023-08-16 ENCOUNTER — Encounter: Payer: Self-pay | Admitting: Neurology

## 2023-08-16 ENCOUNTER — Ambulatory Visit
Admission: RE | Admit: 2023-08-16 | Discharge: 2023-08-16 | Discharge status: Home / Self Care | Source: Ambulatory Visit | Attending: Radiation Oncology | Admitting: Radiation Oncology

## 2023-08-16 ENCOUNTER — Encounter: Payer: Self-pay | Admitting: Radiation Oncology

## 2023-08-16 ENCOUNTER — Telehealth: Payer: Self-pay

## 2023-08-16 ENCOUNTER — Ambulatory Visit
Admission: RE | Admit: 2023-08-16 | Discharge: 2023-08-16 | Disposition: A | Discharge status: Home / Self Care | Source: Ambulatory Visit | Attending: Radiation Oncology | Admitting: Radiation Oncology

## 2023-08-16 VITALS — BP 147/87 | HR 64 | Temp 98.0°F | Resp 18 | Ht 69.5 in | Wt 194.2 lb

## 2023-08-16 DIAGNOSIS — E785 Hyperlipidemia, unspecified: Secondary | ICD-10-CM | POA: Diagnosis not present

## 2023-08-16 DIAGNOSIS — Z87891 Personal history of nicotine dependence: Secondary | ICD-10-CM | POA: Diagnosis not present

## 2023-08-16 DIAGNOSIS — C61 Malignant neoplasm of prostate: Secondary | ICD-10-CM | POA: Diagnosis not present

## 2023-08-16 DIAGNOSIS — Z79899 Other long term (current) drug therapy: Secondary | ICD-10-CM | POA: Insufficient documentation

## 2023-08-16 DIAGNOSIS — I420 Dilated cardiomyopathy: Secondary | ICD-10-CM | POA: Diagnosis not present

## 2023-08-16 DIAGNOSIS — G40909 Epilepsy, unspecified, not intractable, without status epilepticus: Secondary | ICD-10-CM | POA: Insufficient documentation

## 2023-08-16 DIAGNOSIS — I5042 Chronic combined systolic (congestive) and diastolic (congestive) heart failure: Secondary | ICD-10-CM | POA: Diagnosis not present

## 2023-08-16 DIAGNOSIS — I2510 Atherosclerotic heart disease of native coronary artery without angina pectoris: Secondary | ICD-10-CM | POA: Diagnosis not present

## 2023-08-16 DIAGNOSIS — Z191 Hormone sensitive malignancy status: Secondary | ICD-10-CM | POA: Diagnosis not present

## 2023-08-16 DIAGNOSIS — Z8673 Personal history of transient ischemic attack (TIA), and cerebral infarction without residual deficits: Secondary | ICD-10-CM | POA: Diagnosis not present

## 2023-08-16 DIAGNOSIS — Z7982 Long term (current) use of aspirin: Secondary | ICD-10-CM | POA: Insufficient documentation

## 2023-08-16 DIAGNOSIS — R0602 Shortness of breath: Secondary | ICD-10-CM | POA: Diagnosis not present

## 2023-08-16 DIAGNOSIS — I110 Hypertensive heart disease with heart failure: Secondary | ICD-10-CM | POA: Diagnosis not present

## 2023-08-16 DIAGNOSIS — M109 Gout, unspecified: Secondary | ICD-10-CM | POA: Insufficient documentation

## 2023-08-16 DIAGNOSIS — Z923 Personal history of irradiation: Secondary | ICD-10-CM | POA: Insufficient documentation

## 2023-08-16 DIAGNOSIS — Z791 Long term (current) use of non-steroidal anti-inflammatories (NSAID): Secondary | ICD-10-CM | POA: Insufficient documentation

## 2023-08-16 NOTE — Telephone Encounter (Signed)
 Cancel his follow up, let [facility] know to discharge him from clinic

## 2023-08-16 NOTE — Telephone Encounter (Signed)
 Per Dr. Onita, due to multiple no shows, patient dismiised from practice. No refills to be given

## 2023-08-16 NOTE — Telephone Encounter (Signed)
 Just an FYI, this is this pt's 4th no show. He seems to have another appt with us  in December as well.

## 2023-08-16 NOTE — Progress Notes (Signed)
 Introduced myself to the patient as the prostate nurse navigator.  No barriers to care identified at this time.  He is here to discuss his radiation treatment options.  I gave him my business card and asked him to call me with questions or concerns.  Verbalized understanding.  ?

## 2023-08-20 ENCOUNTER — Encounter: Payer: Self-pay | Admitting: Neurology

## 2023-08-21 ENCOUNTER — Telehealth: Payer: Self-pay | Admitting: *Deleted

## 2023-08-21 DIAGNOSIS — M25612 Stiffness of left shoulder, not elsewhere classified: Secondary | ICD-10-CM | POA: Diagnosis not present

## 2023-08-21 DIAGNOSIS — S42035D Nondisplaced fracture of lateral end of left clavicle, subsequent encounter for fracture with routine healing: Secondary | ICD-10-CM | POA: Diagnosis not present

## 2023-08-21 DIAGNOSIS — M6281 Muscle weakness (generalized): Secondary | ICD-10-CM | POA: Diagnosis not present

## 2023-08-21 NOTE — Telephone Encounter (Signed)
CALLED PATIENT TO UPDATE, UNABLE TO LEAVE MESSAGE DUE TO VM BEING FULL 

## 2023-08-21 NOTE — Telephone Encounter (Signed)
 CALLED PATIENT TO UPDATE, UNABLE TO LEAVE MESSAGE DUE TO VM BEING FULL, WILL TRY LATER

## 2023-08-23 ENCOUNTER — Telehealth: Payer: Self-pay | Admitting: *Deleted

## 2023-08-23 NOTE — Telephone Encounter (Signed)
 CALLED PATIENT TO UPDATE, VM FULL UNABLE TO LEAVE MESSAGE

## 2023-08-24 ENCOUNTER — Telehealth: Payer: Self-pay | Admitting: *Deleted

## 2023-08-24 NOTE — Telephone Encounter (Signed)
 Returned patient's phone call, vm full unable to leave message

## 2023-08-27 ENCOUNTER — Ambulatory Visit (INDEPENDENT_AMBULATORY_CARE_PROVIDER_SITE_OTHER): Payer: Medicare Other | Admitting: Family

## 2023-08-27 ENCOUNTER — Encounter: Payer: Self-pay | Admitting: Family

## 2023-08-27 VITALS — BP 138/84 | HR 66 | Temp 98.1°F | Resp 16 | Ht 69.5 in | Wt 191.2 lb

## 2023-08-27 DIAGNOSIS — G8929 Other chronic pain: Secondary | ICD-10-CM

## 2023-08-27 DIAGNOSIS — Z131 Encounter for screening for diabetes mellitus: Secondary | ICD-10-CM | POA: Diagnosis not present

## 2023-08-27 DIAGNOSIS — M25512 Pain in left shoulder: Secondary | ICD-10-CM | POA: Diagnosis not present

## 2023-08-27 NOTE — Progress Notes (Signed)
 6 month follow up,  referral to neurologist jessica, medication refill
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
## 2023-08-28 ENCOUNTER — Other Ambulatory Visit: Payer: Self-pay | Admitting: Urology

## 2023-08-28 ENCOUNTER — Telehealth: Payer: Self-pay | Admitting: *Deleted

## 2023-08-28 ENCOUNTER — Telehealth: Payer: Self-pay | Admitting: Cardiovascular Disease

## 2023-08-28 NOTE — Telephone Encounter (Signed)
   Name: [name]  DOB: Jun 12, 1965  MRN: [id number]  Primary Cardiologist: None   Preoperative team, please contact this patient and set up a phone call appointment for further preoperative risk assessment. Please obtain consent and complete medication review. Thank you for your help.  I confirm that guidance regarding antiplatelet and oral anticoagulation therapy has been completed and, if necessary, noted below.  His aspirin  may be held for 5 to 7 days prior to his procedure.  Please resume as soon as hemostasis is achieved.  I also confirmed the patient resides in the state of [place] . As per [facility] Medical Board telemedicine laws, the patient must reside in the state in which the provider is licensed.   Josefa CHRISTELLA Beauvais, NP 08/28/2023, 11:28 AM [facility]

## 2023-08-28 NOTE — Telephone Encounter (Signed)
   Pre-operative Risk Assessment    Patient Name: [name]  DOB: 29-May-1965 MRN: [id number]      Request for Surgical Clearance    Procedure:  Brachytherapy  Date of Surgery:  Clearance 11/13/23                                 Surgeon:  Dr. Nieves Surgeon's Group or Practice Name:  [facility] Phone number:  [phone]  Fax number:  [phone]   Type of Clearance Requested:   - Medical  - Pharmacy:  Hold Aspirin  5 Days   Type of Anesthesia:  General    Additional requests/questions:    [name]   08/28/2023, 10:49 AM

## 2023-08-28 NOTE — Telephone Encounter (Signed)
 Attempted to reach patient but his mailbox was full. Will try again later. First attempt.

## 2023-08-28 NOTE — Telephone Encounter (Signed)
CALLED PATIENT TO INFORM OF PRE-SEED APPTS. AND IMPLANT DATE, LVM FOR A RETURN CALL 

## 2023-08-29 DIAGNOSIS — M6281 Muscle weakness (generalized): Secondary | ICD-10-CM | POA: Diagnosis not present

## 2023-08-29 DIAGNOSIS — M25612 Stiffness of left shoulder, not elsewhere classified: Secondary | ICD-10-CM | POA: Diagnosis not present

## 2023-08-29 DIAGNOSIS — S42035D Nondisplaced fracture of lateral end of left clavicle, subsequent encounter for fracture with routine healing: Secondary | ICD-10-CM | POA: Diagnosis not present

## 2023-08-29 NOTE — Telephone Encounter (Signed)
 2nd attempt to reach pt regarding surgical clearance and the need for an TELE appointment.  Left message with patient's relative who answered the phone for the patient to call us  back.

## 2023-08-31 DIAGNOSIS — S42035D Nondisplaced fracture of lateral end of left clavicle, subsequent encounter for fracture with routine healing: Secondary | ICD-10-CM | POA: Diagnosis not present

## 2023-08-31 NOTE — Telephone Encounter (Signed)
 3rd attempt: Called and spoke with patient's relative and he will relay the message to patient. He stated that patient is not around him but will tell him when he sees him.

## 2023-09-05 DIAGNOSIS — M25612 Stiffness of left shoulder, not elsewhere classified: Secondary | ICD-10-CM | POA: Diagnosis not present

## 2023-09-05 DIAGNOSIS — M6281 Muscle weakness (generalized): Secondary | ICD-10-CM | POA: Diagnosis not present

## 2023-09-05 DIAGNOSIS — S42035D Nondisplaced fracture of lateral end of left clavicle, subsequent encounter for fracture with routine healing: Secondary | ICD-10-CM | POA: Diagnosis not present

## 2023-09-12 DIAGNOSIS — M6281 Muscle weakness (generalized): Secondary | ICD-10-CM | POA: Diagnosis not present

## 2023-09-12 DIAGNOSIS — M25612 Stiffness of left shoulder, not elsewhere classified: Secondary | ICD-10-CM | POA: Diagnosis not present

## 2023-09-12 DIAGNOSIS — S42035D Nondisplaced fracture of lateral end of left clavicle, subsequent encounter for fracture with routine healing: Secondary | ICD-10-CM | POA: Diagnosis not present

## 2023-09-18 DIAGNOSIS — M6281 Muscle weakness (generalized): Secondary | ICD-10-CM | POA: Diagnosis not present

## 2023-09-18 DIAGNOSIS — M25612 Stiffness of left shoulder, not elsewhere classified: Secondary | ICD-10-CM | POA: Diagnosis not present

## 2023-09-18 DIAGNOSIS — S42035D Nondisplaced fracture of lateral end of left clavicle, subsequent encounter for fracture with routine healing: Secondary | ICD-10-CM | POA: Diagnosis not present

## 2023-09-25 DIAGNOSIS — M25612 Stiffness of left shoulder, not elsewhere classified: Secondary | ICD-10-CM | POA: Diagnosis not present

## 2023-09-25 DIAGNOSIS — M6281 Muscle weakness (generalized): Secondary | ICD-10-CM | POA: Diagnosis not present

## 2023-09-25 DIAGNOSIS — S42035D Nondisplaced fracture of lateral end of left clavicle, subsequent encounter for fracture with routine healing: Secondary | ICD-10-CM | POA: Diagnosis not present

## 2023-09-27 ENCOUNTER — Other Ambulatory Visit: Payer: Self-pay | Admitting: Physical Medicine and Rehabilitation

## 2023-10-02 DIAGNOSIS — M25612 Stiffness of left shoulder, not elsewhere classified: Secondary | ICD-10-CM | POA: Diagnosis not present

## 2023-10-02 DIAGNOSIS — S42035D Nondisplaced fracture of lateral end of left clavicle, subsequent encounter for fracture with routine healing: Secondary | ICD-10-CM | POA: Diagnosis not present

## 2023-10-02 DIAGNOSIS — M6281 Muscle weakness (generalized): Secondary | ICD-10-CM | POA: Diagnosis not present

## 2023-10-08 NOTE — Telephone Encounter (Signed)
 Pt returning call

## 2023-10-08 NOTE — Telephone Encounter (Signed)
 Called patient, a male answered and he said that patient is not home right now, but he will give him the message.

## 2023-10-11 DIAGNOSIS — S42035D Nondisplaced fracture of lateral end of left clavicle, subsequent encounter for fracture with routine healing: Secondary | ICD-10-CM | POA: Diagnosis not present

## 2023-10-11 NOTE — Telephone Encounter (Signed)
 Several attempts have been made to contact patient. Patient needs to call our office to schedule appointment.

## 2023-10-11 NOTE — Progress Notes (Signed)
 RN attempted to reach patient prior to upcoming CT Simulation and brachytherapy.  Voicemail box full.  Will try again at a later time.

## 2023-10-15 ENCOUNTER — Other Ambulatory Visit: Payer: Self-pay | Admitting: Physical Medicine and Rehabilitation

## 2023-10-15 NOTE — Progress Notes (Incomplete)
 Pre-seed nursing interview for a diagnosis:  58 y.o. gentleman with Stage T1c adenocarcinoma of the prostate with Gleason score of 4+3, and PSA of 5.18.   Patient identity verified x2.   Patient states issues as follows...  -Pain: *** -Fatigue: *** -Abdomen: *** -Groin: *** -Urinary: *** -Bowels: *** -Appetite: ***  Patient denies all other related issues at this time.  Meaningful use complete.  Urinary Management medication(s)-  Tamsulosin  0.4 mg po daily. Urology appointment date-  Brachytherapy procedure with Dr.  Gretel Ferrara on 11/13/2023 at [facility].  No vitals needed for this visit.  This concludes the interaction.  NURSE REMINDER: START 'PRE-SEED' EDUCATION VIDEO AT 4:25 mins.

## 2023-10-16 ENCOUNTER — Telehealth: Payer: Self-pay | Admitting: *Deleted

## 2023-10-16 NOTE — Telephone Encounter (Signed)
 CALLED PATIENT TO REMIND OF PRE-SEED APPTS. FOR 10/18/23, NO ANSWER,NO VM, WILL CALL LATER

## 2023-10-16 NOTE — Progress Notes (Signed)
 RN reached out to patient, he was unavailable at the time.  RN provided my direct number for call back with any questions prior to upcoming brachytherapy.

## 2023-10-17 ENCOUNTER — Telehealth: Payer: Self-pay | Admitting: Cardiovascular Disease

## 2023-10-17 NOTE — Telephone Encounter (Signed)
 Patient came in and said he wanted to talk to someone about a procedure on 11/13/2023 he would like to be called to see if he needed an appointment before having this procedure done.

## 2023-10-17 NOTE — Telephone Encounter (Signed)
 Returned call to patient was told patient not there.Left message for patient to call back.

## 2023-10-17 NOTE — Progress Notes (Signed)
  Radiation Oncology         [phone]) [phone] ________________________________  Name: [name] MRN: [id number]  Date: 10/18/2023  DOB: Feb 11, 1965  SIMULATION AND TREATMENT PLANNING NOTE PUBIC ARCH STUDY Definitive Seed Implant as Monotherapy  RR:Duzeyzwd, Greig PARAS, NP  Renda Glance, MD  DIAGNOSIS: 58 y.o. gentleman with Stage T1c adenocarcinoma of the prostate with Gleason score of 4+3, and PSA of 5.18.   Oncology History   No history exists.      ICD-10-CM   1. Malignant neoplasm of prostate (HCC)  C61       COMPLEX SIMULATION:  The patient presented today for evaluation for possible prostate seed implant. He was brought to the radiation planning suite and placed supine on the CT couch. A 3-dimensional image study set was obtained in upload to the planning computer. There, on each axial slice, I contoured the prostate gland. Then, using three-dimensional radiation planning tools I reconstructed the prostate in view of the structures from the transperineal needle pathway to assess for possible pubic arch interference. In doing so, I did not appreciate any pubic arch interference. Also, the patient's prostate volume was estimated based on the drawn structure. The volume was 35 cc.  Given the pubic arch appearance and prostate volume, patient remains a good candidate to proceed with prostate seed implant. Today, he freely provided informed written consent to proceed.    PLAN: The patient will undergo prostate seed implant to 145 Gy.   ________________________________  Donnice LABOR. Patrcia, M.D.

## 2023-10-18 ENCOUNTER — Encounter (HOSPITAL_COMMUNITY): Payer: Self-pay

## 2023-10-18 ENCOUNTER — Encounter: Payer: Self-pay | Admitting: Urology

## 2023-10-18 ENCOUNTER — Ambulatory Visit
Admission: RE | Admit: 2023-10-18 | Discharge: 2023-10-18 | Disposition: A | Discharge status: Home / Self Care | Source: Ambulatory Visit | Attending: Radiation Oncology | Admitting: Radiation Oncology

## 2023-10-18 ENCOUNTER — Ambulatory Visit
Admission: RE | Admit: 2023-10-18 | Discharge: 2023-10-18 | Disposition: A | Discharge status: Home / Self Care | Payer: Self-pay | Source: Ambulatory Visit | Attending: Urology | Admitting: Urology

## 2023-10-18 DIAGNOSIS — C61 Malignant neoplasm of prostate: Secondary | ICD-10-CM | POA: Diagnosis not present

## 2023-10-18 DIAGNOSIS — Z191 Hormone sensitive malignancy status: Secondary | ICD-10-CM | POA: Diagnosis not present
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
## 2023-10-18 NOTE — Progress Notes (Addendum)
 Pre-seed nursing interview for a diagnosis:  58 y.o. gentleman with Stage T1c adenocarcinoma of the prostate with Gleason score of 4+3, and PSA of 5.18.   Patient identity verified x2.   Patient states issues as follows...  -Pain:  left shoulder previous fracture 8/10, Baclofen  and gabapentin , steroid shots. -Fatigue: yes -Abdomen: No -Groin: No -Urinary:  No -Bowels: No -Appetite: Good -Weight:  190.6 lb  Patient denies all other related issues at this time.  Meaningful use complete.  Urinary Management medication(s)-  Tamsulosin  0.4 mg po daily. Urology appointment date-  Brachytherapy procedure with Dr.  Gretel Ferrara on 11/13/2023 at [facility].  No vitals needed for this visit.  This concludes the interaction.  NURSE REMINDER: START 'PRE-SEED' EDUCATION VIDEO AT 4:25 mins.

## 2023-10-19 ENCOUNTER — Inpatient Hospital Stay

## 2023-10-19 ENCOUNTER — Encounter: Payer: Self-pay | Admitting: Gastroenterology

## 2023-10-19 NOTE — Telephone Encounter (Signed)
 Left message for patient to call to make an appointment for clearance for procedure.

## 2023-10-22 ENCOUNTER — Telehealth: Payer: Self-pay | Admitting: *Deleted

## 2023-10-22 NOTE — Telephone Encounter (Signed)
 Patient returned call

## 2023-10-22 NOTE — Telephone Encounter (Signed)
 Attempted to return patient's call received no answer and MB full. Will remove from pool and update surgeons office

## 2023-10-22 NOTE — Telephone Encounter (Signed)
 RETURNED PATIENT'S PHONE CALL, SPOKE WITH PATIENT. ?

## 2023-10-26 ENCOUNTER — Other Ambulatory Visit: Payer: Self-pay | Admitting: Physical Medicine and Rehabilitation

## 2023-11-05 NOTE — Progress Notes (Signed)
 Unable to get patient on the phone to schedule a pre op appointment. I mailed a letter to patient's [address] address.

## 2023-11-06 ENCOUNTER — Encounter (HOSPITAL_COMMUNITY): Payer: Self-pay

## 2023-11-06 ENCOUNTER — Encounter: Payer: Self-pay | Admitting: Physical Medicine and Rehabilitation

## 2023-11-06 ENCOUNTER — Encounter: Attending: Physical Medicine and Rehabilitation | Admitting: Physical Medicine and Rehabilitation

## 2023-11-06 VITALS — BP 126/70 | HR 83 | Ht 69.5 in | Wt 189.4 lb

## 2023-11-06 DIAGNOSIS — S42002A Fracture of unspecified part of left clavicle, initial encounter for closed fracture: Secondary | ICD-10-CM | POA: Diagnosis not present

## 2023-11-06 DIAGNOSIS — R252 Cramp and spasm: Secondary | ICD-10-CM | POA: Insufficient documentation

## 2023-11-06 DIAGNOSIS — I10 Essential (primary) hypertension: Secondary | ICD-10-CM | POA: Insufficient documentation

## 2023-11-06 DIAGNOSIS — C61 Malignant neoplasm of prostate: Secondary | ICD-10-CM | POA: Insufficient documentation

## 2023-11-06 DIAGNOSIS — S42035A Nondisplaced fracture of lateral end of left clavicle, initial encounter for closed fracture: Secondary | ICD-10-CM | POA: Insufficient documentation

## 2023-11-06 MED ORDER — LABETALOL HCL 100 MG PO TABS: 100.0000 mg | ORAL_TABLET | Freq: Two times a day (BID) | ORAL | 0 refills | Status: DC

## 2023-11-06 MED ORDER — BACLOFEN 10 MG PO TABS: 10.0000 mg | ORAL_TABLET | Freq: Every evening | ORAL | 3 refills | Status: DC

## 2023-11-06 NOTE — Patient Instructions (Addendum)
 SURGICAL WAITING ROOM VISITATION Patients having surgery or a procedure may have no more than 2 support people in the waiting area - these visitors may rotate.    Children under the age of 82 must have an adult with them who is not the patient.  If the patient needs to stay at the hospital during part of their recovery, the visitor guidelines for inpatient rooms apply. Pre-op nurse will coordinate an appropriate time for 1 support person to accompany patient in pre-op.  This support person may not rotate.    Please refer to the [facility] website for the visitor guidelines for Inpatients (after your surgery is over and you are in a regular room).       Your procedure is scheduled on: 11-13-23   Report to [facility] Main Entrance    Report to admitting at 9:15 AM   Call this number if you have problems the morning of surgery [phone]   Do not eat food or drink liquids :After Midnight.          If you have questions, please contact your surgeon's office.   FOLLOW BOWEL PREP AND ANY ADDITIONAL PRE OP INSTRUCTIONS YOU RECEIVED FROM YOUR SURGEON'S OFFICE!!!  - Fleet enema the night before surgery     Oral Hygiene is also important to reduce your risk of infection.                                    Remember - BRUSH YOUR TEETH THE MORNING OF SURGERY WITH YOUR REGULAR TOOTHPASTE   Do NOT smoke after Midnight   Take these medicines the morning of surgery with A SIP OF WATER:    Amlodipine    Atorvastatin    Gabapentin    Labetalol    Topiramate   Stop all vitamins and herbal supplements 7 days before surgery                              You may not have any metal on your body including  jewelry, and body piercing             Do not wear lotions, powders, cologne, or deodorant              Men may shave face and neck.   Do not bring valuables to the hospital. [facility] IS NOT RESPONSIBLE   FOR VALUABLES.   Contacts, dentures or bridgework may not be worn into  surgery.   DO NOT BRING YOUR HOME MEDICATIONS TO THE HOSPITAL. PHARMACY WILL DISPENSE MEDICATIONS LISTED ON YOUR MEDICATION LIST TO YOU DURING YOUR ADMISSION IN THE HOSPITAL!    Patients discharged on the day of surgery will not be allowed to drive home.  Someone NEEDS to stay with you for the first 24 hours after anesthesia.   Special Instructions: Bring a copy of your healthcare power of attorney and living will documents the day of surgery if you haven't scanned them before.              Please read over the following fact sheets you were given: IF YOU HAVE QUESTIONS ABOUT YOUR PRE-OP INSTRUCTIONS PLEASE CALL [phone] Gwen  If you received a COVID test during your pre-op visit  it is requested that you wear a mask when out in public, stay away from anyone that may not be feeling well and notify your  surgeon if you develop symptoms. If you test positive for Covid or have been in contact with anyone that has tested positive in the last 10 days please notify you surgeon.  [facility] - Preparing for Surgery Before surgery, you can play an important role.  Because skin is not sterile, your skin needs to be as free of germs as possible.  You can reduce the number of germs on your skin by washing with CHG (chlorahexidine gluconate) soap before surgery.  CHG is an antiseptic cleaner which kills germs and bonds with the skin to continue killing germs even after washing. Please DO NOT use if you have an allergy to CHG or antibacterial soaps.  If your skin becomes reddened/irritated stop using the CHG and inform your nurse when you arrive at Short Stay. Do not shave (including legs and underarms) for at least 48 hours prior to the first CHG shower.  You may shave your face/neck.  Please follow these instructions carefully:  1.  Shower with CHG Soap the night before surgery and the  morning of surgery.  2.  If you choose to wash your hair, wash your hair first as usual with your normal  shampoo.  3.   After you shampoo, rinse your hair and body thoroughly to remove the shampoo.                             4.  Use CHG as you would any other liquid soap.  You can apply chg directly to the skin and wash.  Gently with a scrungie or clean washcloth.  5.  Apply the CHG Soap to your body ONLY FROM THE NECK DOWN.   Do   not use on face/ open                           Wound or open sores. Avoid contact with eyes, ears mouth and   genitals (private parts).                       Wash face,  Genitals (private parts) with your normal soap.             6.  Wash thoroughly, paying special attention to the area where your    surgery  will be performed.  7.  Thoroughly rinse your body with warm water from the neck down.  8.  DO NOT shower/wash with your normal soap after using and rinsing off the CHG Soap.                9.  Pat yourself dry with a clean towel.            10.  Wear clean pajamas.            11.  Place clean sheets on your bed the night of your first shower and do not  sleep with pets. Day of Surgery : Do not apply any lotions/deodorants the morning of surgery.  Please wear clean clothes to the hospital/surgery center.  FAILURE TO FOLLOW THESE INSTRUCTIONS MAY RESULT IN THE CANCELLATION OF YOUR SURGERY  PATIENT SIGNATURE_________________________________  NURSE SIGNATURE__________________________________  ________________________________________________________________________

## 2023-11-06 NOTE — Progress Notes (Signed)
 Date of COVID positive in last 90 days:  PCP - Greig Drones, NP Cardiologist - Jerel Balding, MD Neurologist - Harlene Bogaert, NP  Chest x-ray - N/A EKG - 05-28-23 Epic Stress Test - 08-10-14 Epic ECHO - 08-29-22 Epic Cardiac Cath -  Pacemaker/ICD device last checked:N/A Spinal Cord Stimulator:N/A  Bowel Prep - N/A  Sleep Study - N/A CPAP -   Fasting Blood Sugar - N/A Checks Blood Sugar _____ times a day  Last dose of GLP1 agonist-  N/A GLP1 instructions:  Do not take after     Last dose of SGLT-2 inhibitors-  N/A SGLT-2 instructions:  Do not take after     Blood Thinner Instructions: N/A Last dose:   Time: Aspirin  Instructions:  ASA 81 Last Dose:  Activity level:  Can go up a flight of stairs and perform activities of daily living without stopping and without symptoms of chest pain or shortness of breath.  Able to exercise without symptoms  Unable to go up a flight of stairs without symptoms of     Anesthesia review:Cardiomyopathy, CHF, HTN, hx of CVA, hx of seizures  Patient denies shortness of breath, fever, cough and chest pain at PAT appointment  Patient verbalized understanding of instructions that were given to them at the PAT appointment. Patient was also instructed that they will need to review over the PAT instructions again at home before surgery.

## 2023-11-06 NOTE — Progress Notes (Signed)
 Subjective:    Patient ID: [name], male    DOB: Jul 23, 1965, 58 y.o.   MRN: [id number]  HPI [name] is a 58 year old man who presents for follow-up of CVA  1) Overweight: -has lost a lot of weight! -he is scared to eat his wife's food  -discussed that weight is down to 192  lbs -tries to do 10 laps back and forth -he now does 17 laps  2) Problems with spouse -his divorce is going to be finalized next week -he got accepted in a two bedroom -under adult protective services now -she has been hacking into his phone.  -his wife has a new boyfriend and he thinks this has influenced her -his ex forged his signature with the IRS  -discussed that his wife came looking for him at the [place] [facility] and his [facility] is supportive of him.  -his wife hacked his phone  3) CVA -he is doing well -he asks for refill of gabapentin  for his neuropathy -he is walking -using the cane -nerve pain has been stable  4) HTN: -BP is stable today -he has been off labetalol  for a few days  5) Neuropathy: -needs refill of gabapentin  -was told that his nerves are good.   6) Left shoulder pain: -has been following with orthopedic and has been treated with steroid injections  7) Right knee pain -felt a pop when he was going to the dining hall at the [place] [facility] -has lost power when he is trying to go up steps -they told him in therapy that they may have worn out his right side.  -still hurting -voltaren  gel is not helping, he is only using at night  8) Left wrist pain: -he would like to see ortho for a new brace  9) Left clavicle fracture: -swiveled off chair onto concrete -went to the gym and walked for a mile and went back to the center and saw that something did not look right in his shoulder -the next morning he went to urgent care and was found that he fractured left clavicle -she was given a stabilizer which he has to wear for 1 month and no activity 4-6 weeks    Pain Inventory Average  Pain 8 Pain Right Now 8 My pain is intermittent and aching, stabbing  LOCATION OF PAIN  Shoulder, Hand, Fingers, Leg, Toes  BOWEL Number of stools per week: 7   BLADDER Normal Flow not steady    Mobility use a cane how many minutes can you walk? 30 ability to climb steps?  yes do you drive?  yes Do you have any goals in this area?  yes  Function disabled: date disabled . I need assistance with the following:  meal prep and household duties  Neuro/Psych numbness tingling anxiety  Prior Studies Any changes since last visit?  yes CT/MRI  Physicians involved in your care Any changes since last visit?  no   Family History  Problem Relation Age of Onset   Hypertension Mother        Passed away when she is 2 secondary to hip surgery complications   CAD Mother    Heart attack Mother    Hypertension Father    Diabetes Brother    Stroke Brother    Heart attack Maternal Grandmother    Colon cancer Neg Hx    Esophageal cancer Neg Hx    Pancreatic cancer Neg Hx    Stomach cancer Neg Hx  Colon polyps Neg Hx    Social History   Socioeconomic History   Marital status: Married    Spouse name: Not on file   Number of children: Not on file   Years of education: Not on file   Highest education level: Not on file  Occupational History   Not on file  Tobacco Use   Smoking status: Former    Types: Cigarettes    Passive exposure: Past   Smokeless tobacco: Never  Vaping Use   Vaping status: Never Used  Substance and Sexual Activity   Alcohol  use: Not Currently   Drug use: Not Currently    Types: Cocaine, Marijuana, Oxycodone     Comment: uses CBD oil on feet.   Sexual activity: Yes  Other Topics Concern   Not on file  Social History Narrative   Not on file   Social Drivers of Health   Financial Resource Strain: Low Risk  (08/27/2023)   Overall Financial Resource Strain (CARDIA)    Difficulty of Paying Living Expenses: Not hard at all  Food Insecurity:  No Food Insecurity (08/16/2023)   Hunger Vital Sign    Worried About Running Out of Food in the Last Year: Never true    Ran Out of Food in the Last Year: Never true  Transportation Needs: Unmet Transportation Needs (08/16/2023)   PRAPARE - Administrator, Civil Service (Medical): Yes    Lack of Transportation (Non-Medical): No  Physical Activity: Sufficiently Active (08/27/2023)   Exercise Vital Sign    Days of Exercise per Week: 5 days    Minutes of Exercise per Session: 30 min  Stress: No Stress Concern Present (08/27/2023)   [employer] of Occupational Health - Occupational Stress Questionnaire    Feeling of Stress: Not at all  Social Connections: Unknown (06/07/2021)   [facility]   Social Network    Social Network: Not on file   Past Surgical History:  Procedure Laterality Date   COLONOSCOPY WITH PROPOFOL  N/A 12/09/2020   Procedure: COLONOSCOPY WITH PROPOFOL ;  Surgeon: Leigh Elspeth SQUIBB, MD;  Location: [facility];  Service: Gastroenterology;  Laterality: N/A;   KNEE ARTHROSCOPY Left 04/13/2016   Procedure: ARTHROSCOPY KNEE WITH DEBRIDEMENT;  Surgeon: Maude LELON Right, MD;  Location: [facility];  Service: Orthopedics;  Laterality: Left;   KNEE ARTHROSCOPY WITH MEDIAL MENISECTOMY Right 04/13/2016   Procedure: KNEE ARTHROSCOPY WITH MEDIAL MENISECTOMY;  Surgeon: Maude LELON Right, MD;  Location: [facility];  Service: Orthopedics;  Laterality: LEFT not right knee   NASAL FRACTURE SURGERY     PROSTATE BIOPSY     SHOULDER SURGERY Left    Past Medical History:  Diagnosis Date   Anxiety    Cataract    removed left eye   Chronic combined systolic and diastolic CHF (congestive heart failure) (HCC)    EF 25-30%   Diastolic dysfunction    Dyslipidemia    Hyperlipidemia    Hypertension    Neuromuscular disorder (HCC)    neuropathy left foot   Nonischemic dilated cardiomyopathy (HCC)    felt secondary to HTN with no  ischemia on nuclear stress test 05/2012, EF 25-30%   Prostate cancer (HCC)    Seizures (HCC)    last seizure 05-2020- on Keppra    Shortness of breath    Stroke (HCC) 05/2020   BP 126/70 (BP Location: Right Arm, Patient Position: Sitting, Cuff Size: Large) Comment: patient requested right side  Pulse 83  Ht 5' 9.5 (1.765 m)   Wt 189 lb 6.4 oz (85.9 kg)   SpO2 97%   BMI 27.57 kg/m   Opioid Risk Score:   Fall Risk Score:  `1  Depression screen [facility] 2/9     08/27/2023   10:51 AM 08/16/2023   10:54 AM 01/29/2023    1:24 PM 10/24/2022    1:18 PM 08/07/2022   11:12 AM 03/10/2022   10:40 AM 02/20/2022    1:40 PM  Depression screen PH[facility] 2/9  Decreased Interest 0 0 0 0 0 0 0  Down, Depressed, Hopeless 0 0 0 0 0 0 1  PHQ - 2 Score 0 0 0 0 0 0 1  Altered sleeping       0  Tired, decreased energy       0  Change in appetite       1  Feeling bad or failure about yourself        0  Trouble concentrating       0  Moving slowly or fidgety/restless       0  Suicidal thoughts       0  PHQ-9 Score       2  Difficult doing work/chores       Somewhat difficult    Gen: no distress, normal appearing HEENT: oral mucosa pink and moist, NCAT Cardio: Reg rate Chest: normal effort, normal rate of breathing Abd: soft, non-distended Ext: no edema Psych: pleasant, normal affect Skin: intact MSK: Left hand and foot weakness. Ambulating with cane, left arm in sling Right knee TTP, left wrist brace     Family History  Problem Relation Age of Onset   Hypertension Mother        Passed away when she is 24 secondary to hip surgery complications   CAD Mother    Heart attack Mother    Hypertension Father    Diabetes Brother    Stroke Brother    Heart attack Maternal Grandmother    Colon cancer Neg Hx    Esophageal cancer Neg Hx    Pancreatic cancer Neg Hx    Stomach cancer Neg Hx    Colon polyps Neg Hx    Social History   Socioeconomic History   Marital status: Married    Spouse name: Not on  file   Number of children: Not on file   Years of education: Not on file   Highest education level: Not on file  Occupational History   Not on file  Tobacco Use   Smoking status: Former    Types: Cigarettes    Passive exposure: Past   Smokeless tobacco: Never  Vaping Use   Vaping status: Never Used  Substance and Sexual Activity   Alcohol  use: Not Currently   Drug use: Not Currently    Types: Cocaine, Marijuana, Oxycodone     Comment: uses CBD oil on feet.   Sexual activity: Yes  Other Topics Concern   Not on file  Social History Narrative   Not on file   Social Drivers of Health   Financial Resource Strain: Low Risk  (08/27/2023)   Overall Financial Resource Strain (CARDIA)    Difficulty of Paying Living Expenses: Not hard at all  Food Insecurity: No Food Insecurity (08/16/2023)   Hunger Vital Sign    Worried About Running Out of Food in the Last Year: Never true    Ran Out of Food in the Last Year: Never true  Transportation Needs: Unmet  Transportation Needs (08/16/2023)   PRAPARE - Administrator, Civil Service (Medical): Yes    Lack of Transportation (Non-Medical): No  Physical Activity: Sufficiently Active (08/27/2023)   Exercise Vital Sign    Days of Exercise per Week: 5 days    Minutes of Exercise per Session: 30 min  Stress: No Stress Concern Present (08/27/2023)   [employer] of Occupational Health - Occupational Stress Questionnaire    Feeling of Stress: Not at all  Social Connections: Unknown (06/07/2021)   Received from [facility]   Social Network    Social Network: Not on file   Past Surgical History:  Procedure Laterality Date   COLONOSCOPY WITH PROPOFOL  N/A 12/09/2020   Procedure: COLONOSCOPY WITH PROPOFOL ;  Surgeon: Leigh Elspeth SQUIBB, MD;  Location: [facility];  Service: Gastroenterology;  Laterality: N/A;   KNEE ARTHROSCOPY Left 04/13/2016   Procedure: ARTHROSCOPY KNEE WITH DEBRIDEMENT;  Surgeon: Maude LELON Right, MD;  Location:  [facility];  Service: Orthopedics;  Laterality: Left;   KNEE ARTHROSCOPY WITH MEDIAL MENISECTOMY Right 04/13/2016   Procedure: KNEE ARTHROSCOPY WITH MEDIAL MENISECTOMY;  Surgeon: Maude LELON Right, MD;  Location: [facility];  Service: Orthopedics;  Laterality: LEFT not right knee   NASAL FRACTURE SURGERY     PROSTATE BIOPSY     SHOULDER SURGERY Left    Past Medical History:  Diagnosis Date   Anxiety    Cataract    removed left eye   Chronic combined systolic and diastolic CHF (congestive heart failure) (HCC)    EF 25-30%   Diastolic dysfunction    Dyslipidemia    Hyperlipidemia    Hypertension    Neuromuscular disorder (HCC)    neuropathy left foot   Nonischemic dilated cardiomyopathy (HCC)    felt secondary to HTN with no ischemia on nuclear stress test 05/2012, EF 25-30%   Prostate cancer (HCC)    Seizures (HCC)    last seizure 05-2020- on Keppra    Shortness of breath    Stroke (HCC) 05/2020   BP 126/70 (BP Location: Right Arm, Patient Position: Sitting, Cuff Size: Large) Comment: patient requested right side  Pulse 83   Ht 5' 9.5 (1.765 m)   Wt 189 lb 6.4 oz (85.9 kg)   SpO2 97%   BMI 27.57 kg/m   Opioid Risk Score:   Fall Risk Score:  `1  Depression screen [facility] 2/9     08/27/2023   10:51 AM 08/16/2023   10:54 AM 01/29/2023    1:24 PM 10/24/2022    1:18 PM 08/07/2022   11:12 AM 03/10/2022   10:40 AM 02/20/2022    1:40 PM  Depression screen PHQ 2/9  Decreased Interest 0 0 0 0 0 0 0  Down, Depressed, Hopeless 0 0 0 0 0 0 1  PHQ - 2 Score 0 0 0 0 0 0 1  Altered sleeping       0  Tired, decreased energy       0  Change in appetite       1  Feeling bad or failure about yourself        0  Trouble concentrating       0  Moving slowly or fidgety/restless       0  Suicidal thoughts       0  PHQ-9 Score       2  Difficult doing work/chores       Somewhat difficult    Review of  Systems  Musculoskeletal:  Positive for gait problem.        Pain in Shoulder, Hand, Fingers, Leg, Toes  Neurological:        Tingling  Psychiatric/Behavioral:         Anxiety  All other systems reviewed and are negative.  PE: Gen: no distress, normal appearing HEENT: oral mucosa pink and moist, NCAT Cardio: Reg rate Chest: normal effort, normal rate of breathing Abd: soft, non-distended Ext: no edema Psych: pleasant, normal affect Skin: intact Neuro: Spasticity in left arm MAS 2 at elbow, ambulating with cane     Assessment & Plan:   [name] is a 58 year old man presenting today for f/u of left hemiplegia, right sided low back pain, and left sided foot pain following stroke, left shoulder pain.  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  -continue atorvastatin  -continue daily exercise at [facility] -refilled atorvastatin  for mixed HLD -discussed that he is going to the gym, continue going 6 days per week.  -discussed that he has a good social community at [employer], the gym, and the [place] -referred to PCP -referred to PT --progress to quad walker -face to face for AFO performed -discussed that most motor improvement occurs in the first 4 weeks, but improvement is still seen in the first 6 months, and some improvement may still be seen in the first 2 weeks -evaluated for vagal nerve stimulation trial for unfortunately will not qualify due to his stroke being hemorrhagic -will send him information about deep brain stimulation, discussed with him that I have seen this help one of my patients -Incredible improvement in ambulation!  -discussed that he is doing 10 laps daily and feeling great -refilled gabapentin  -graduated from therapy -Neuropathy is moving up to neck.  -was released from the hospital one year today -she is doing exercises.  -left hand is getting on his nerves -he thinks -his therapists  are planning to take him to the pool and told him this may help with his arm pain -pain has been severe at home, as it had been in hospital -his wife purchased 500mg  tablets of tylenol  -he is worried about his decreased upper extremity strength and asks if this will ever improve -he is not experiencing much shoulder pain right now -he does have some strength -the benefits from the trigger point injections lwore off within 1 week -pain is severely limiting his function -he is taking Tizanidine  and it does not help, does make him sleepy, but his wife is trying to encourage him to stay up during the day -he developed the foot pain a couple of weeks ago -he was wearing a boot at that time so took it off but has still had pain a couple of weeks afterward. -he has not tried anything for this pain -it is worst at night and feels like burning and tingling at his heel. -he has been doing great with therapy and is walking in his home! -his wife asks if he could get a handicap placard.  -the foot is still feeling numb with pins and needles, also painful. He is unsure whether Gabapentin  helps- so far nothing he as tried has helped his neuropathy. His balance is also now off and he has been stumbling more. He actually felt that the Gabapentin  made his pain worse. He has never tried Cymbalta  but is willing to try  it.  -Has been able to use bathroom himself! -he is working with therapy right now.  -he was feeling wobbly over the weekend so had to miss his PT session -shoulder is feeling very numb and cannot make a fist -ready to proceed with Qutenza  today -His wife asks for refill of Eucerin and Diclofenac  and Tizanidine .  -She asks whether he should still be taking Seroquel  -he called medical records and records were faxed on December 28th.  -He called our office today  -He needs medical records to process his disability claims -able to walk much better.  -his wife mentioned to me that she is still  worried about him driving. He has been driving around locally to pharmacy   2) Lower extremity edema -worsening -not painful -he asks when his next appointment with me is.  -has gotten used to it -therapist was concerned -painful -sometimes feels cold on the left foot -never took the fluid pills -he is hoping to walk more now that the weather has improved D/c amlodipine   3) Headaches -started last week and felt loss of balance -this stopped him from doing therapy.  -still having tension in head.  -he takes topamax  25mg  HS.   4) Obesity -he has lost 64 lbs! -discussed that current weight is 186 lbs  5) Left shoulder pain -intermittent -sometimes he uses voltaren  gel and this seems to help the most -discussed that he has been following with ortho and benefitting from injections -discussed that he has 2 hearings coming up regarding his shoulder.   6) Left upper extremity neuropathy -started last month -received steroid injection 2 months ago. Asks if this is a side effect of the steroid injection -asks about a supplement and if this is ok to take -he asks if there is anything else he can do for this -refilled gabapentin   7) Cervical myofascial pain syndrome -he wants to know if injections could help  8) Neuropathy is getting worse -he gets depressed about this.  -refilled gabapentin  300mg  TID -continue topamax  100mg  BID  9) Urinary frequency: -notes that this started after taking the Baclofen , but his wife said she never gave it to him -he did take the cymbalta  but stopped due to this potential side effect -he is urinating every 5-10 minutes -he is willing to get UA and UC drawn today.  Refilled flomax   10) Problems with wife -discussed that he has a  -discussed that his divorce will be finalized next week -he believes his wife is trying to tell his healthcare providers he has dementia -he also feels that one day his cane was filled with water to make it more  difficult for him to use and that she did this, he says this was witnessed by his therapy team -his family is unaware of this as they have moved and he has not yet discussed with them -he would like his medical records to be restricted from his wife.  -he asks if I can help him find alternative housing -discussed that the [place] is giving him good support -discussed that his wife is tracking to hack his phone.  -discussed that his wife was falsely saying that she had dementia. -discussed that he ran into her at the bank.  -discussed that step daughter teamed up with her    65) HTN: -d/c amlodipine  -refilled labetalol  --refilled losartan  and tamsulosin  -d/c tizanidine  -decrease losartan  to 25mg  daily -Advised checking BP daily at home and logging results to bring into follow-up appointment with PCP and  myself. -Reviewed BP meds today.  -Advised regarding healthy foods that can help lower blood pressure and provided with a list: 1) citrus foods- high in vitamins and minerals 2) salmon and other fatty fish - reduces inflammation and oxylipins 3) swiss chard (leafy green)- high level of nitrates 4) pumpkin seeds- one of the best natural sources of magnesium 5) Beans and lentils- high in fiber, magnesium, and potassium 6) Berries- high in flavonoids 7) Amaranth (whole grain, can be cooked similarly to rice and oats)- high in magnesium and fiber 8) Pistachios- even more effective at reducing BP than other nuts 9) Carrots- high in phenolic compounds that relax blood vessels and reduce inflammation 10) Celery- contain phthalides that relax tissues of arterial walls 11) Tomatoes- can also improve cholesterol and reduce risk of heart disease 12) Broccoli- good source of magnesium, calcium , and potassium 13) Greek yogurt: high in potassium and calcium  14) Herbs and spices: Celery seed, cilantro, saffron, lemongrass, black cumin, ginseng, cinnamon, cardamom, sweet basil, and ginger 15) Chia and flax  seeds- also help to lower cholesterol and blood sugar 16) Beets- high levels of nitrates that relax blood vessels  17) spinach and bananas- high in potassium  -Provided lise of supplements that can help with hypertension:  1) magnesium: one high quality brand is Bi[employer]izers since it contains all 7 types of magnesium, otherwise over the counter magnesium gluconate 400mg  is a good option 2) B vitamins 3) vitamin D 4) potassium 5) CoQ10 6) L-arginine 7) Vitamin C 8) Beetroot -Educated that goal BP is 120/80. -Made goal to incorporate some of the above foods into diet.    12) Left hand weakness: -ordered OT, continue HEP  13) Tongue swelling:  -d/c tizanidine   14) Right knee pain:  -XR ordered, discussed spurring -recommended neoprene knee sleeve  15) Left wrist pain: -continue brace -referred to Dr. Erwin  16. Elevated PSA: -discussed that he had a biopsy and the results are pending -discussed stress reduction, meditation, avoiding added sugars, eating fruits and vegetables  17. Edema:  -discussed to monitor for shortness of breath -continue walking   18. Left clavicle fracture: -continue sling -discussed that he continues to have pain  19. Prostate cancer: -discussed that he is going to be doing the prostate seeds -discussed that he has stage 2 cancer -discussed that exercise is helpful for cancer prevention as well as ability to tolerate the surgery -discussed that his father had prostate cancer as well  20. Spasticity:  -continue baclofen  10mg  HS -discussed botox  21. Lumbar radiculopathy: -discussed PT but he is busy

## 2023-11-08 ENCOUNTER — Telehealth: Payer: Self-pay

## 2023-11-08 ENCOUNTER — Encounter (HOSPITAL_COMMUNITY)
Admission: RE | Admit: 2023-11-08 | Discharge: 2023-11-08 | Disposition: A | Discharge status: Home / Self Care | Source: Ambulatory Visit | Attending: Urology | Admitting: Urology

## 2023-11-08 ENCOUNTER — Ambulatory Visit (INDEPENDENT_AMBULATORY_CARE_PROVIDER_SITE_OTHER)

## 2023-11-08 ENCOUNTER — Telehealth (HOSPITAL_BASED_OUTPATIENT_CLINIC_OR_DEPARTMENT_OTHER): Payer: Self-pay | Admitting: *Deleted

## 2023-11-08 ENCOUNTER — Encounter (HOSPITAL_COMMUNITY): Payer: Self-pay

## 2023-11-08 ENCOUNTER — Other Ambulatory Visit: Payer: Self-pay

## 2023-11-08 VITALS — BP 125/74 | HR 80 | Temp 98.4°F | Resp 18 | Ht 69.5 in | Wt 191.0 lb

## 2023-11-08 DIAGNOSIS — I5042 Chronic combined systolic (congestive) and diastolic (congestive) heart failure: Secondary | ICD-10-CM | POA: Diagnosis not present

## 2023-11-08 DIAGNOSIS — Z8673 Personal history of transient ischemic attack (TIA), and cerebral infarction without residual deficits: Secondary | ICD-10-CM | POA: Insufficient documentation

## 2023-11-08 DIAGNOSIS — Z01810 Encounter for preprocedural cardiovascular examination: Secondary | ICD-10-CM | POA: Diagnosis not present

## 2023-11-08 DIAGNOSIS — C61 Malignant neoplasm of prostate: Secondary | ICD-10-CM | POA: Diagnosis not present

## 2023-11-08 DIAGNOSIS — E7800 Pure hypercholesterolemia, unspecified: Secondary | ICD-10-CM | POA: Diagnosis not present

## 2023-11-08 DIAGNOSIS — Z01812 Encounter for preprocedural laboratory examination: Secondary | ICD-10-CM | POA: Diagnosis not present

## 2023-11-08 DIAGNOSIS — I110 Hypertensive heart disease with heart failure: Secondary | ICD-10-CM | POA: Diagnosis not present

## 2023-11-08 DIAGNOSIS — I10 Essential (primary) hypertension: Secondary | ICD-10-CM

## 2023-11-08 HISTORY — DX: Malignant neoplasm of prostate: C61

## 2023-11-08 HISTORY — DX: Family history of other specified conditions: Z84.89

## 2023-11-08 LAB — BASIC METABOLIC PANEL WITH GFR
Anion gap: 8 (ref 5–15)
BUN: 26 mg/dL — ABNORMAL HIGH (ref 6–20)
CO2: 26 mmol/L (ref 22–32)
Calcium: 9.7 mg/dL (ref 8.9–10.3)
Chloride: 109 mmol/L (ref 98–111)
Creatinine, Ser: 1.38 mg/dL — ABNORMAL HIGH (ref 0.61–1.24)
GFR, Estimated: 59 mL/min — ABNORMAL LOW (ref >60)
Glucose, Bld: 67 mg/dL — ABNORMAL LOW (ref 70–99)
Potassium: 4.2 mmol/L (ref 3.5–5.1)
Sodium: 142 mmol/L (ref 135–145)

## 2023-11-08 NOTE — Telephone Encounter (Signed)
 Pt walked into our [address] office . In a conversation with Geni DRAFTS RN the pt was supposed to call for tele preop appt. Per Katlyn West, NP she will do tele now.   I have scheduled the pt and put consent in the chart. Meds are not done by CMA as the call was directed to preop APP.

## 2023-11-08 NOTE — Telephone Encounter (Signed)
 Pt walked in as he is scheduled for prostate surgery on 11/13/2023.  Pt is asking if cardiac clearance is needed for procedure.  He states he just left the pre-op appointment and no one mentioned clearance and said he was good to proceed with surgery.   Pt advised usually if surgical clearance is needed surgeons office will fax a clearance request to our office but I do not currently see this in his chart.  Pt encouraged to contact urology office to clarify if he needs cardiac clearance.He attempted to contact from our office and had to leave a voicemail message. Pt verbalizes understanding and agrees with current plan. Will also forward to pre-op team to make them aware of conversation.

## 2023-11-08 NOTE — Telephone Encounter (Signed)
 Pt walked into our [address] office . In a conversation with Geni DRAFTS RN the pt was supposed to call for tele preop appt. Per Katlyn West, NP she will do tele now.    I have scheduled the pt and put consent in the chart. Meds are not done by CMA as the call was directed to preop APP.         Patient Consent for Virtual Visit        [name] has provided verbal consent on 11/08/2023 for a virtual visit (video or telephone).   CONSENT FOR VIRTUAL VISIT FOR:  [name]  By participating in this virtual visit I agree to the following:  I hereby voluntarily request, consent and authorize [facility] and its employed or contracted physicians, physician assistants, nurse practitioners or other licensed health care professionals (the Practitioner), to provide me with telemedicine health care services (the "Services) as deemed necessary by the treating Practitioner. I acknowledge and consent to receive the Services by the Practitioner via telemedicine. I understand that the telemedicine visit will involve communicating with the Practitioner through live audiovisual communication technology and the disclosure of certain medical information by electronic transmission. I acknowledge that I have been given the opportunity to request an in-person assessment or other available alternative prior to the telemedicine visit and am voluntarily participating in the telemedicine visit.  I understand that I have the right to withhold or withdraw my consent to the use of telemedicine in the course of my care at any time, without affecting my right to future care or treatment, and that the Practitioner or I may terminate the telemedicine visit at any time. I understand that I have the right to inspect all information obtained and/or recorded in the course of the telemedicine visit and may receive copies of available information for a reasonable fee.  I understand that some of the potential risks  of receiving the Services via telemedicine include:  Delay or interruption in medical evaluation due to technological equipment failure or disruption; Information transmitted may not be sufficient (e.g. poor resolution of images) to allow for appropriate medical decision making by the Practitioner; and/or  In rare instances, security protocols could fail, causing a breach of personal health information.  Furthermore, I acknowledge that it is my responsibility to provide information about my medical history, conditions and care that is complete and accurate to the best of my ability. I acknowledge that Practitioner's advice, recommendations, and/or decision may be based on factors not within their control, such as incomplete or inaccurate data provided by me or distortions of diagnostic images or specimens that may result from electronic transmissions. I understand that the practice of medicine is not an exact science and that Practitioner makes no warranties or guarantees regarding treatment outcomes. I acknowledge that a copy of this consent can be made available to me via my patient portal [facility] MyChart), or I can request a printed copy by calling the office of [facility].    I understand that my insurance will be billed for this visit.   I have read or had this consent read to me. I understand the contents of this consent, which adequately explains the benefits and risks of the Services being provided via telemedicine.  I have been provided ample opportunity to ask questions regarding this consent and the Services and have had my questions answered to my satisfaction. I give my informed consent for the services to be provided  through the use of telemedicine in my medical care

## 2023-11-08 NOTE — Telephone Encounter (Signed)
 I called # [phone], and man answered phone but says he is just a friend of the pt. I gave [phone] for pt to call back ASAP as he is going to need a tele preop appt.

## 2023-11-08 NOTE — Telephone Encounter (Signed)
 Preop team has received this message today from triage RN. In review of the chart I reviewed that our preop team had tried to reach the pt a number of times w/o success. Pt called back and tried to reach him again w/o success. I will have to run this past the preop APP, as we were calling previously to schedule tele. However now that he is reaching out to us  this late when his procedure is 11/13/23; I will need to confirm if still tele and are we adding on today or tomorrow.

## 2023-11-08 NOTE — Progress Notes (Signed)
 Virtual Visit via Telephone Note   Because of [name] co-morbid illnesses, he is at least at moderate risk for complications without adequate follow up.  This format is felt to be most appropriate for this patient at this time.  Due to technical limitations with video connection (technology), today's appointment will be conducted as an audio only telehealth visit, and [name] verbally agreed to proceed in this manner.   All issues noted in this document were discussed and addressed.  No physical exam could be performed with this format.  Evaluation Performed:  Preoperative cardiovascular risk assessment _____________   Date:  11/08/2023   Patient ID:  [name], DOB Dec 11, 1965, MRN [id number] Patient Location:  Home Provider location:   Office  Primary Care Provider:  Lorren Greig PARAS, NP Primary Cardiologist:  None  Chief Complaint / Patient Profile  58 y.o. 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 History of Present Illness  [name] is a 58 y.o. male who presents via audio/video conferencing for a telehealth visit today.  Pt was last seen in cardiology clinic on 05/28/2023 by Dr. Francyne.  At that time [name] was doing well.  The patient is now pending procedure as outlined above. Since his last visit, he has remained stable from a cardiac standpoint. Today he denies chest pain, shortness of breath, lower extremity edema, fatigue, palpitations, melena, hematuria, hemoptysis, diaphoresis, weakness, presyncope, syncope, orthopnea, and PND.  He is able to achieve greater than 4 METS of activity, reports [place] [facility] [place], tolerates well. Past Medical History    Past Medical History:  Diagnosis Date   Anxiety    Cataract    removed left eye    Chronic combined systolic and diastolic CHF (congestive heart failure) (HCC)    EF 25-30%   Diastolic dysfunction    Dyslipidemia    Family history of adverse reaction to anesthesia    Mother   Hyperlipidemia    Hypertension    Neuromuscular disorder (HCC)    neuropathy left foot   Nonischemic dilated cardiomyopathy (HCC)    felt secondary to HTN with no ischemia on nuclear stress test 05/2012, EF 25-30%   Prostate cancer (HCC)    Seizures (HCC)    last seizure 05-2020- on Keppra    Shortness of breath    Stroke (HCC) 05/2020   Past Surgical History:  Procedure Laterality Date   COLONOSCOPY WITH PROPOFOL  N/A 12/09/2020   Procedure: COLONOSCOPY WITH PROPOFOL ;  Surgeon: Leigh Elspeth SQUIBB, MD;  Location: [facility];  Service: Gastroenterology;  Laterality: N/A;   KNEE ARTHROSCOPY Left 04/13/2016   Procedure: ARTHROSCOPY KNEE WITH DEBRIDEMENT;  Surgeon: Maude LELON Right, MD;  Location: [facility];  Service: Orthopedics;  Laterality: Left;   KNEE ARTHROSCOPY WITH MEDIAL MENISECTOMY Right 04/13/2016   Procedure: KNEE ARTHROSCOPY WITH MEDIAL MENISECTOMY;  Surgeon: Maude LELON Right, MD;  Location: [facility];  Service: Orthopedics;  Laterality: LEFT not right knee   NASAL FRACTURE SURGERY     PROSTATE BIOPSY     SHOULDER SURGERY Left     Allergies  Allergies  Allergen Reactions   Tizanidine      Other Reaction(s): Angioedema of tongue   Tamsulosin      Other Reaction(s): Not available    Home Medications  Prior to Admission medications   Medication Sig Start Date End Date Taking? Authorizing Provider  amLODipine  (NORVASC ) 2.5 MG tablet Take 1 tablet by mouth once daily 10/16/23   Raulkar, Krutika P, MD  aspirin  EC 81 MG tablet Take 81 mg by mouth daily. Swallow whole.    [provider]  atorvastatin  (LIPITOR) 80 MG tablet Take 1 tablet (80 mg total) by mouth daily. 08/06/23   Raulkar, Sven SQUIBB, MD  baclofen  (LIORESAL ) 10 MG tablet  Take 1 tablet (10 mg total) by mouth at bedtime. 11/06/23   Raulkar, Sven SQUIBB, MD  diclofenac  (VOLTAREN ) 50 MG EC tablet Take 1 tablet (50 mg total) by mouth 2 (two) times daily. 07/14/23   Reddick, Johnathan B, NP  folic acid  (FOLVITE ) 1 MG tablet Take 1 tablet by mouth once daily 10/26/23   Raulkar, Krutika P, MD  gabapentin  (NEURONTIN ) 300 MG capsule Take 1 capsule (300 mg total) by mouth 2 (two) times daily AND 2 capsules (600 mg total) at bedtime. 08/06/23   Raulkar, Sven SQUIBB, MD  labetalol  (NORMODYNE ) 100 MG tablet Take 1 tablet (100 mg total) by mouth 2 (two) times daily. 11/06/23   Raulkar, Sven SQUIBB, MD  losartan  (COZAAR ) 25 MG tablet Take 1 tablet (25 mg total) by mouth daily. 08/06/23   Raulkar, Sven SQUIBB, MD  Multiple Vitamins-Minerals (CERTAVITE/ANTIOXIDANTS) TABS Take 1 tablet by mouth daily. 07/21/20   Love, Sharlet RAMAN, PA-C  sildenafil (REVATIO) 20 MG tablet Take 1-3 tablets by mouth as needed (ED).    [provider]  tadalafil (CIALIS) 5 MG tablet Take 5 mg by mouth every morning. 08/04/21   [provider]  tamsulosin  (FLOMAX ) 0.4 MG CAPS capsule Take 0.4 mg by mouth daily at 6 (six) AM. Patient not taking: Reported on 11/08/2023 05/18/23   [provider]  thiamine  100 MG tablet Take 1 tablet (100 mg total) by mouth daily. 07/21/20   Love, Sharlet RAMAN, PA-C  topiramate  (TOPAMAX ) 100 MG tablet Take 100 mg by mouth 2 (two) times daily. 09/29/23   [provider]   Physical Exam  Vital Signs:  [name] does not have vital signs available for review today. Given telephonic nature of communication, physical exam is limited. AAOx3. NAD. Normal affect.  Speech and respirations are unlabored. Accessory Clinical Findings  None Assessment & Plan  1.  Preoperative Cardiovascular Risk Assessment: Mr. Peixoto perioperative risk of a major cardiac event is 6.6% according to the Revised Cardiac Risk Index (RCRI).  Therefore, he is at high risk for perioperative  complications.   His functional capacity is good at 6.79 METs according to the Duke Activity Status Index (DASI). Recommendations: According to ACC/AHA guidelines, no further cardiovascular testing needed.  The patient may proceed to surgery at acceptable risk.   Antiplatelet and/or Anticoagulation Recommendations: Aspirin  can be held for 5 days prior to his surgery.  Please resume Aspirin  post operatively when it is felt to be safe from a bleeding standpoint.   The patient was advised that if he develops new symptoms prior to surgery to contact our office to arrange for a follow-up visit, and he verbalized understanding.  A copy of this note will be routed to requesting surgeon.  Time:   Today, I have spent 10 minutes with the patient with telehealth technology discussing medical history, symptoms, and management plan.    Shawne Eskelson D Markeith Jue, NP  11/08/2023, 4:49 PM

## 2023-11-08 NOTE — Telephone Encounter (Signed)
 Me    11/08/23  1:02 PM Note Preop team has received this message today from triage RN. In review of the chart I reviewed that our preop team had tried to reach the pt a number of times w/o success. Pt called back and tried to reach him again w/o success. I will have to run this past the preop APP, as we were calling previously to schedule tele. However now that he is reaching out to us  this late when his procedure is 11/13/23; I will need to confirm if still tele and are we adding on today or tomorrow.     Casimir Aldona BRAVO, RN to [employer] Callback (Selected Message)     11/08/23 12:26 PM FYI Casimir Aldona BRAVO, RN    11/08/23 12:26 PM Note Pt walked in as he is scheduled for prostate surgery on 11/13/2023.  Pt is asking if cardiac clearance is needed for procedure.  He states he just left the pre-op appointment and no one mentioned clearance and said he was good to proceed with surgery.   Pt advised usually if surgical clearance is needed surgeons office will fax a clearance request to our office but I do not currently see this in his chart.  Pt encouraged to contact urology office to clarify if he needs cardiac clearance.He attempted to contact from our office and had to leave a voicemail message. Pt verbalizes understanding and agrees with current plan. Will also forward to pre-op team to make them aware of conversation.       11/08/23 12:10 PM Ezzard Beath III contacted Casimir Aldona BRAVO, RN (In Person)

## 2023-11-09 ENCOUNTER — Telehealth: Payer: Self-pay | Admitting: Cardiovascular Disease

## 2023-11-09 ENCOUNTER — Encounter (HOSPITAL_COMMUNITY): Payer: Self-pay

## 2023-11-09 ENCOUNTER — Ambulatory Visit (HOSPITAL_COMMUNITY): Admission: EM | Admit: 2023-11-09 | Discharge: 2023-11-09 | Disposition: A | Discharge status: Home / Self Care

## 2023-11-09 DIAGNOSIS — S00412A Abrasion of left ear, initial encounter: Secondary | ICD-10-CM

## 2023-11-09 NOTE — Telephone Encounter (Signed)
 Manuelita with [facility] requesting this patients clearance be faxed again. Pts procedure is scheduled for 10/21. Please advise.   [facility]  Fax: [phone]

## 2023-11-09 NOTE — ED Triage Notes (Signed)
 Pt c/o lt ear pain and when used a qtip it had blood on it x2 days. Denies injury.

## 2023-11-09 NOTE — ED Provider Notes (Addendum)
 [facility]    CSN: [id number] Arrival date & time: 11/09/23  1231      History   Chief Complaint Chief Complaint  Patient presents with   Otalgia    HPI Lafe Clerk III is a 58 y.o. male.   58 year old male presents urgent care with complaints of bleeding from the left ear for the last 2 days.  He reports that he was using a Q-tip to clean his earwax out and noted blood on the Q-tip.  He has continued to note blood on the Q-tip with cleaning his ear on the left.  He denies any pain, fever or loss of hearing.   Otalgia Associated symptoms: ear discharge (Bleeding)   Associated symptoms: no abdominal pain, no cough, no fever, no rash, no sore throat and no vomiting     Past Medical History:  Diagnosis Date   Anxiety    Cataract    removed left eye   Chronic combined systolic and diastolic CHF (congestive heart failure) (HCC)    EF 25-30%   Diastolic dysfunction    Dyslipidemia    Family history of adverse reaction to anesthesia    Mother   Hyperlipidemia    Hypertension    Neuromuscular disorder (HCC)    neuropathy left foot   Nonischemic dilated cardiomyopathy (HCC)    felt secondary to HTN with no ischemia on nuclear stress test 05/2012, EF 25-30%   Prostate cancer (HCC)    Seizures (HCC)    last seizure 05-2020- on Keppra    Shortness of breath    Stroke (HCC) 05/2020    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) 10/24/2022   Chronic diastolic CHF (congestive heart failure) (HCC) 11/07/2021   Left ventricular hypertrophy 11/07/2021   Osteoarthritis of left glenohumeral joint 05/04/2021   Colon cancer screening    Left hemiplegia (HCC) 07/02/2020   Cognitive deficit due to recent stroke 07/02/2020   Dysphagia, post-stroke    Vascular headache    Acute blood  loss anemia    AKI (acute kidney injury)    Essential hypertension    Partial seizure (HCC)    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   Intracranial hemorrhage (HCC) 06/09/2020   Sprain of anterior talofibular ligament of left ankle 01/15/2020   Hyperlipidemia, on Lipitor 04/01/2017   Alcohol  use disorder, mild, abuse 06/30/2016   Morbid obesity (HCC) 06/30/2016   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   Chronic combined systolic and diastolic CHF (congestive heart failure) (HCC) 11/08/2012   HTN (hypertension) 05/25/2012    Past Surgical History:  Procedure Laterality Date   COLONOSCOPY WITH PROPOFOL  N/A 12/09/2020   Procedure: COLONOSCOPY WITH PROPOFOL ;  Surgeon: Leigh Elspeth SQUIBB, MD;  Location: [facility];  Service: Gastroenterology;  Laterality: N/A;   KNEE ARTHROSCOPY Left 04/13/2016   Procedure: ARTHROSCOPY KNEE WITH DEBRIDEMENT;  Surgeon: Maude LELON Right, MD;  Location: [facility];  Service: Orthopedics;  Laterality: Left;   KNEE ARTHROSCOPY WITH MEDIAL MENISECTOMY Right 04/13/2016   Procedure: KNEE ARTHROSCOPY WITH MEDIAL MENISECTOMY;  Surgeon: Maude LELON Right, MD;  Location: [facility];  Service: Orthopedics;  Laterality: LEFT not right knee   NASAL FRACTURE SURGERY     PROSTATE BIOPSY     SHOULDER SURGERY Left        Home Medications  Prior to Admission medications   Medication Sig Start Date End Date Taking? Authorizing Provider  amLODipine  (NORVASC ) 2.5 MG tablet Take 1 tablet by mouth once daily 10/16/23   Raulkar, Krutika P, MD  aspirin  EC 81 MG tablet Take 81 mg by mouth daily. Swallow whole.    [provider]  atorvastatin  (LIPITOR) 80 MG tablet Take 1 tablet (80 mg total) by mouth daily. 08/06/23   Raulkar, Sven SQUIBB, MD  baclofen  (LIORESAL ) 10 MG tablet Take 1 tablet (10 mg total) by mouth at bedtime. 11/06/23   Raulkar, Sven SQUIBB, MD  diclofenac  (VOLTAREN ) 50 MG EC tablet Take 1 tablet (50 mg total)  by mouth 2 (two) times daily. 07/14/23   Reddick, Johnathan B, NP  folic acid  (FOLVITE ) 1 MG tablet Take 1 tablet by mouth once daily 10/26/23   Raulkar, Krutika P, MD  gabapentin  (NEURONTIN ) 300 MG capsule Take 1 capsule (300 mg total) by mouth 2 (two) times daily AND 2 capsules (600 mg total) at bedtime. 08/06/23   Raulkar, Sven SQUIBB, MD  labetalol  (NORMODYNE ) 100 MG tablet Take 1 tablet (100 mg total) by mouth 2 (two) times daily. 11/06/23   Raulkar, Sven SQUIBB, MD  losartan  (COZAAR ) 25 MG tablet Take 1 tablet (25 mg total) by mouth daily. 08/06/23   Raulkar, Sven SQUIBB, MD  Multiple Vitamins-Minerals (CERTAVITE/ANTIOXIDANTS) TABS Take 1 tablet by mouth daily. 07/21/20   Love, Sharlet RAMAN, PA-C  sildenafil (REVATIO) 20 MG tablet Take 1-3 tablets by mouth as needed (ED).    [provider]  tadalafil (CIALIS) 5 MG tablet Take 5 mg by mouth every morning. 08/04/21   [provider]  tamsulosin  (FLOMAX ) 0.4 MG CAPS capsule Take 0.4 mg by mouth daily at 6 (six) AM. Patient not taking: Reported on 11/08/2023 05/18/23   [provider]  thiamine  100 MG tablet Take 1 tablet (100 mg total) by mouth daily. 07/21/20   Love, Sharlet RAMAN, PA-C  topiramate  (TOPAMAX ) 100 MG tablet Take 100 mg by mouth 2 (two) times daily. 09/29/23   [provider]    Family History Family History  Problem Relation Age of Onset   Hypertension Mother        Passed away when she is 34 secondary to hip surgery complications   CAD Mother    Heart attack Mother    Hypertension Father    Diabetes Brother    Stroke Brother    Heart attack Maternal Grandmother    Colon cancer Neg Hx    Esophageal cancer Neg Hx    Pancreatic cancer Neg Hx    Stomach cancer Neg Hx    Colon polyps Neg Hx     Social History Social History   Tobacco Use   Smoking status: Never    Passive exposure: Past   Smokeless tobacco: Never  Vaping Use   Vaping status: Never Used  Substance Use Topics   Alcohol  use: Not  Currently   Drug use: Not Currently    Types: Cocaine, Marijuana, Oxycodone     Comment: uses CBD oil on feet.     Allergies   Tizanidine  and Tamsulosin    Review of Systems Review of Systems  Constitutional:  Negative for chills and fever.  HENT:  Positive for ear discharge (Bleeding). Negative for ear pain and sore throat.   Eyes:  Negative for pain and visual disturbance.  Respiratory:  Negative for cough and shortness of breath.   Cardiovascular:  Negative for chest pain and palpitations.  Gastrointestinal:  Negative for abdominal pain and vomiting.  Genitourinary:  Negative for dysuria and hematuria.  Musculoskeletal:  Negative for arthralgias and back pain.  Skin:  Negative for color change and rash.  Neurological:  Negative for seizures and syncope.  All other systems reviewed and are negative.    Physical Exam Triage Vital Signs ED Triage Vitals  Encounter Vitals Group     BP 11/09/23 1309 135/79     Girls Systolic BP Percentile --      Girls Diastolic BP Percentile --      Boys Systolic BP Percentile --      Boys Diastolic BP Percentile --      Pulse Rate 11/09/23 1309 75     Resp 11/09/23 1309 18     Temp 11/09/23 1309 98.1 F (36.7 C)     Temp Source 11/09/23 1309 Oral     SpO2 11/09/23 1309 98 %     Weight --      Height --      Head Circumference --      Peak Flow --      Pain Score 11/09/23 1306 0     Pain Loc --      Pain Education --      Exclude from Growth Chart --    No data found.  Updated Vital Signs BP 135/79 (BP Location: Left Arm)   Pulse 75   Temp 98.1 F (36.7 C) (Oral)   Resp 18   SpO2 98%   Visual Acuity Right Eye Distance:   Left Eye Distance:   Bilateral Distance:    Right Eye Near:   Left Eye Near:    Bilateral Near:     Physical Exam Vitals and nursing note reviewed.  Constitutional:      General: He is not in acute distress.    Appearance: He is well-developed.  HENT:     Head: Normocephalic and atraumatic.      Left Ear: Drainage (Blood) present.     Ears:     Comments: Moderate amount of blood within the left ear canal Eyes:     Conjunctiva/sclera: Conjunctivae normal.  Cardiovascular:     Rate and Rhythm: Normal rate and regular rhythm.     Heart sounds: No murmur heard. Pulmonary:     Effort: Pulmonary effort is normal. No respiratory distress.     Breath sounds: Normal breath sounds.  Abdominal:     Palpations: Abdomen is soft.     Tenderness: There is no abdominal tenderness.  Musculoskeletal:        General: No swelling.     Cervical back: Neck supple.  Skin:    General: Skin is warm and dry.     Capillary Refill: Capillary refill takes less than 2 seconds.  Neurological:     Mental Status: He is alert.  Psychiatric:        Mood and Affect: Mood normal.      UC Treatments / Results  Labs (all labs ordered are listed, but only abnormal results are displayed) Labs Reviewed - No data to display  EKG   Radiology No results found.  Procedures Procedures (including critical care time)  Medications Ordered in UC Medications - No data to display  Initial Impression / Assessment and Plan / UC Course  I have reviewed the triage vital signs and the nursing notes.  Pertinent labs & imaging results that were available during my care of the patient were reviewed by me  and considered in my medical decision making (see chart for details).     Abrasion of left ear canal, initial encounter   Symptoms and physical exam findings are consistent with an abrasion of the left ear canal.  Blood present in the ear canal but no active bleeding.  Irrigation done today.  No signs of infection present. This was likely secondary to using Q-tip for cleaning the ears.  Recommend not using Q-tips within the ear canal.  Can try over-the-counter Debrox solution to help with keeping the ear canals clean. Return to urgent care or PCP if symptoms worsen or fail to resolve.    Final Clinical  Impressions(s) / UC Diagnoses   Final diagnoses:  Abrasion of left ear canal, initial encounter     Discharge Instructions      Symptoms and physical exam findings are consistent with an abrasion of the left ear canal.  Blood present in the ear canal but no active bleeding.  Irrigation done today.  No signs of infection present. This was likely secondary to using Q-tip for cleaning the ears.  Recommend not using Q-tips within the ear canal.  Can try over-the-counter Debrox solution to help with keeping the ear canals clean. Return to urgent care or PCP if symptoms worsen or fail to resolve.       ED Prescriptions   None    PDMP not reviewed this encounter.   Teresa Almarie LABOR, PA-C 11/09/23 1320    Teresa Almarie LABOR, [place] 11/09/23 1320

## 2023-11-09 NOTE — Discharge Instructions (Addendum)
 Symptoms and physical exam findings are consistent with an abrasion of the left ear canal.  Blood present in the ear canal but no active bleeding.  Irrigation done today.  No signs of infection present. This was likely secondary to using Q-tip for cleaning the ears.  Recommend not using Q-tips within the ear canal.  Can try over-the-counter Debrox solution to help with keeping the ear canals clean. Return to urgent care or PCP if symptoms worsen or fail to resolve.

## 2023-11-09 NOTE — Progress Notes (Signed)
 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  Has recovered EF by last echo in August 2024.  Last seen by cardiology on 05/28/2023.  Noted to be stable at that visit.  Advised continued medical management and follow-up in 1 year.  He had a cardiac clearance televisit on 02/08/2023.  He denied any symptoms and reported good functional capacity.  He was cleared for surgery:  Preoperative Cardiovascular Risk Assessment: [name] perioperative risk of a major cardiac event is 6.6% according to the Revised Cardiac Risk Index (RCRI).  Therefore, he is at high risk for perioperative complications.   His functional capacity is good at 6.79 METs according to the Duke Activity Status Index (DASI). Recommendations: According to ACC/AHA guidelines, no further cardiovascular testing needed.  The patient may proceed to surgery at acceptable risk.   Antiplatelet and/or Anticoagulation Recommendations: Aspirin  can be held for 5 days prior to his surgery.  Please resume Aspirin  post operatively when it is felt to be safe from a bleeding standpoint.   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 Last seen by neurology on 06/11/2023.  He has residual spastic left hemiparesis and paresthesias. No seizure activity, remains off Keppra   VS: BP 125/74   Pulse 80   Temp 36.9 C (Oral)   Resp 18   Ht 5' 9.5  (1.765 m)   Wt 86.6 kg   SpO2 100%   BMI 27.80 kg/m   PROVIDERS: Lorren Greig PARAS, NP   LABS: Labs reviewed: Acceptable for surgery.  Mild AKI (all labs ordered are listed, but only abnormal results are displayed)  Labs Reviewed  BASIC METABOLIC PANEL WITH GFR - Abnormal; Notable for the following components:      Result Value   Glucose, Bld 67 (*)    BUN 26 (*)    Creatinine, Ser 1.38 (*)    GFR, Estimated 59 (*)    All other components within normal limits    Echo 08/29/2022:  IMPRESSIONS    1. Left ventricular ejection fraction, by estimation, is 55 to 60%. Left ventricular ejection fraction by 3D volume is 56 %. The left ventricle has normal function. The left ventricle has no regional wall motion abnormalities. Left ventricular diastolic  parameters were normal. The average left ventricular global longitudinal strain is -24.2 %. The global longitudinal strain is normal.  2. Right ventricular systolic function is normal. The right ventricular size is normal.  3. The mitral valve is normal in structure. No evidence of mitral valve regurgitation. No evidence of mitral stenosis.  4. The aortic valve is normal in structure. Aortic valve regurgitation is not visualized. No aortic stenosis is present.  5. The inferior vena cava is normal in size with greater than 50% respiratory variability, suggesting right atrial pressure of 3 mmHg.  Comparison(s): No significant change from prior study. Prior images reviewed side by side.  Past Medical History:  Diagnosis Date   Anxiety    Cataract    removed left eye   Chronic combined systolic and diastolic CHF (congestive heart failure) (HCC)    EF 25-30%   Diastolic dysfunction    Dyslipidemia    Family history of adverse reaction to anesthesia    Mother   Hyperlipidemia    Hypertension    Neuromuscular disorder (HCC)    neuropathy left foot   Nonischemic dilated cardiomyopathy (HCC)    felt secondary to HTN with no  ischemia on nuclear stress test 05/2012, EF 25-30%   Prostate cancer (HCC)    Seizures (HCC)    last seizure 05-2020- on Keppra    Shortness of breath    Stroke (HCC) 05/2020    Past Surgical History:  Procedure Laterality Date   COLONOSCOPY WITH PROPOFOL  N/A 12/09/2020   Procedure: COLONOSCOPY WITH PROPOFOL ;  Surgeon: Leigh Elspeth SQUIBB, MD;  [facility];  Service: Gastroenterology;  Laterality: N/A;   KNEE ARTHROSCOPY Left 04/13/2016   Procedure: ARTHROSCOPY KNEE WITH DEBRIDEMENT;  Surgeon: Maude LELON Right, MD;  Location:  [facility];  Service: Orthopedics;  Laterality: Left;   KNEE ARTHROSCOPY WITH MEDIAL MENISECTOMY Right 04/13/2016   Procedure: KNEE ARTHROSCOPY WITH MEDIAL MENISECTOMY;  Surgeon: Maude LELON Right, MD;  Location: [facility];  Service: Orthopedics;  Laterality: LEFT not right knee   NASAL FRACTURE SURGERY     PROSTATE BIOPSY     SHOULDER SURGERY Left     MEDICATIONS:  amLODipine  (NORVASC ) 2.5 MG tablet   aspirin  EC 81 MG tablet   atorvastatin  (LIPITOR) 80 MG tablet   baclofen  (LIORESAL ) 10 MG tablet   diclofenac  (VOLTAREN ) 50 MG EC tablet   folic acid  (FOLVITE ) 1 MG tablet   gabapentin  (NEURONTIN ) 300 MG capsule   labetalol  (NORMODYNE ) 100 MG tablet   losartan  (COZAAR ) 25 MG tablet   Multiple Vitamins-Minerals (CERTAVITE/ANTIOXIDANTS) TABS   sildenafil (REVATIO) 20 MG tablet   tadalafil (CIALIS) 5 MG tablet   tamsulosin  (FLOMAX ) 0.4 MG CAPS capsule   thiamine  100 MG tablet   topiramate  (TOPAMAX ) 100 MG tablet   No current facility-administered medications for this encounter.   Burnard CHRISTELLA Odis DEVONNA [facility] Stay/Anesthesiology [facility] Phone [phone] 11/09/2023 10:09 AM

## 2023-11-09 NOTE — Anesthesia Preprocedure Evaluation (Signed)
 Anesthesia Evaluation  Patient identified by MRN, date of birth, ID band  Reviewed: Allergy & Precautions, NPO status , Patient's Chart, lab work & pertinent test results  Airway Mallampati: II  TM Distance: >3 FB Neck ROM: Full    Dental no notable dental hx.    Pulmonary shortness of breath   Pulmonary exam normal breath sounds clear to auscultation       Cardiovascular Exercise Tolerance: Good hypertension, +CHF  Normal cardiovascular exam Rhythm:Regular Rate:Normal  EKG 05/2020: Sinus tachycardia Ventricular premature complex Probable left atrial enlargement Borderline repolarization abnormality  ECHO 05/2020 1. Left ventricular ejection fraction, by estimation, is 65 to 70%. The  left ventricle has normal function. The left ventricle has no regional  wall motion abnormalities. There is severe left ventricular hypertrophy.  Indeterminate diastolic filling due  to E-A fusion.  2. Right ventricular systolic function is normal. The right ventricular  size is normal. There is normal pulmonary artery systolic pressure. The  estimated right ventricular systolic pressure is 28.4 mmHg.  3. The mitral valve is normal in structure. Trivial mitral valve  regurgitation. No evidence of mitral stenosis.  4. The aortic valve is normal in structure. Aortic valve regurgitation is  trivial. No aortic stenosis is present.  5. The inferior vena cava is normal in size with greater than 50%  respiratory variability, suggesting right atrial pressure of 3 mmHg.  6. Increased flow velocities may be secondary to anemia, thyrotoxicosis,  hyperdynamic or high flow state.    Neuro/Psych  Headaches, Seizures -,   Anxiety      Neuromuscular disease CVA, Residual Symptoms    GI/Hepatic Neg liver ROS,,,  Endo/Other  negative endocrine ROS    Renal/GU negative Renal ROS  negative genitourinary   Musculoskeletal   Abdominal    Peds negative pediatric ROS (+)  Hematology  (+) Blood dyscrasia, anemia   Anesthesia Other Findings Left side weakness/drooping  Reproductive/Obstetrics                              Anesthesia Physical Anesthesia Plan  ASA: 3  Anesthesia Plan: General   Post-op Pain Management:    Induction: Intravenous  PONV Risk Score and Plan: 2 and Treatment may vary due to age or medical condition, Ondansetron  and Midazolam   Airway Management Planned: Oral ETT  Additional Equipment: None  Intra-op Plan:   Post-operative Plan: Extubation in OR  Informed Consent: I have reviewed the patients History and Physical, chart, labs and discussed the procedure including the risks, benefits and alternatives for the proposed anesthesia with the patient or authorized representative who has indicated his/her understanding and acceptance.     Dental advisory given  Plan Discussed with: Anesthesiologist and CRNA  Anesthesia Plan Comments: (See PAT note from 10/16 )         Anesthesia Quick Evaluation

## 2023-11-12 ENCOUNTER — Other Ambulatory Visit: Payer: Self-pay | Admitting: Physical Medicine and Rehabilitation

## 2023-11-12 ENCOUNTER — Telehealth: Payer: Self-pay | Admitting: *Deleted

## 2023-11-12 DIAGNOSIS — S42035A Nondisplaced fracture of lateral end of left clavicle, initial encounter for closed fracture: Secondary | ICD-10-CM

## 2023-11-12 MED ORDER — BACLOFEN 10 MG PO TABS: 10.0000 mg | ORAL_TABLET | Freq: Every evening | ORAL | 3 refills | Status: AC

## 2023-11-12 NOTE — Telephone Encounter (Signed)
 CALLED PATIENT TO REMIND OF PROCEDURE FOR 11-13-23, LVM FOR A RETURN CALL

## 2023-11-12 NOTE — Telephone Encounter (Signed)
 Pt came to remind you of his meds refill, Diclofenac  50mg  (pain) and Baclofen  10mg  (muscle relaxer).

## 2023-11-13 ENCOUNTER — Encounter (HOSPITAL_COMMUNITY): Payer: Self-pay | Admitting: Urology

## 2023-11-13 ENCOUNTER — Ambulatory Visit (HOSPITAL_COMMUNITY)

## 2023-11-13 ENCOUNTER — Ambulatory Visit (HOSPITAL_COMMUNITY): Payer: Self-pay | Admitting: Physician Assistant

## 2023-11-13 ENCOUNTER — Other Ambulatory Visit: Payer: Self-pay

## 2023-11-13 ENCOUNTER — Ambulatory Visit (HOSPITAL_COMMUNITY): Admitting: Anesthesiology

## 2023-11-13 ENCOUNTER — Ambulatory Visit (HOSPITAL_COMMUNITY): Admission: RE | Admit: 2023-11-13 | Discharge: 2023-11-13 | Disposition: A | Attending: Urology | Admitting: Urology

## 2023-11-13 ENCOUNTER — Encounter (HOSPITAL_COMMUNITY)
Admission: RE | Disposition: A | Discharge status: Home / Self Care | Payer: Self-pay | Source: Home / Self Care | Attending: Urology

## 2023-11-13 DIAGNOSIS — C61 Malignant neoplasm of prostate: Secondary | ICD-10-CM

## 2023-11-13 DIAGNOSIS — E785 Hyperlipidemia, unspecified: Secondary | ICD-10-CM | POA: Diagnosis not present

## 2023-11-13 DIAGNOSIS — I5033 Acute on chronic diastolic (congestive) heart failure: Secondary | ICD-10-CM

## 2023-11-13 DIAGNOSIS — I5042 Chronic combined systolic (congestive) and diastolic (congestive) heart failure: Secondary | ICD-10-CM | POA: Diagnosis not present

## 2023-11-13 DIAGNOSIS — I110 Hypertensive heart disease with heart failure: Secondary | ICD-10-CM

## 2023-11-13 DIAGNOSIS — Z191 Hormone sensitive malignancy status: Secondary | ICD-10-CM | POA: Diagnosis not present

## 2023-11-13 HISTORY — PX: RADIOACTIVE SEED IMPLANT: SHX5150

## 2023-11-13 HISTORY — PX: SPACE OAR INSTILLATION: SHX6769

## 2023-11-13 SURGERY — INSERTION, RADIATION SOURCE, PROSTATE
Anesthesia: General

## 2023-11-13 MED ORDER — LIDOCAINE HCL (PF) 2 % IJ SOLN
INTRAMUSCULAR | Status: AC
  Filled 2023-11-13: qty 5

## 2023-11-13 MED ORDER — ROCURONIUM BROMIDE 10 MG/ML (PF) SYRINGE
PREFILLED_SYRINGE | INTRAVENOUS | Status: AC
  Filled 2023-11-13: qty 10

## 2023-11-13 MED ORDER — LIDOCAINE HCL (PF) 2 % IJ SOLN
INTRAMUSCULAR | Status: DC | PRN
  Administered 2023-11-13: 60 mg via INTRADERMAL

## 2023-11-13 MED ORDER — HYDROMORPHONE HCL 1 MG/ML IJ SOLN: 0.2500 mg | INTRAMUSCULAR | Status: DC | PRN

## 2023-11-13 MED ORDER — ONDANSETRON HCL 4 MG/2ML IJ SOLN
INTRAMUSCULAR | Status: AC
  Filled 2023-11-13: qty 2

## 2023-11-13 MED ORDER — PHENYLEPHRINE 80 MCG/ML (10ML) SYRINGE FOR IV PUSH (FOR BLOOD PRESSURE SUPPORT)
PREFILLED_SYRINGE | INTRAVENOUS | Status: AC
  Filled 2023-11-13: qty 10

## 2023-11-13 MED ORDER — OXYCODONE HCL 5 MG PO TABS: 5.0000 mg | ORAL_TABLET | Freq: Once | ORAL | Status: DC | PRN

## 2023-11-13 MED ORDER — ROCURONIUM BROMIDE 10 MG/ML (PF) SYRINGE
PREFILLED_SYRINGE | INTRAVENOUS | Status: DC | PRN
  Administered 2023-11-13: 60 mg via INTRAVENOUS

## 2023-11-13 MED ORDER — ORAL CARE MOUTH RINSE
15.0000 mL | Freq: Once | OROMUCOSAL | Status: AC
  Administered 2023-11-13: 15 mL via OROMUCOSAL

## 2023-11-13 MED ORDER — DEXMEDETOMIDINE HCL IN NACL 80 MCG/20ML IV SOLN
INTRAVENOUS | Status: AC
  Filled 2023-11-13: qty 20

## 2023-11-13 MED ORDER — LACTATED RINGERS IV SOLN: INTRAVENOUS | Status: DC

## 2023-11-13 MED ORDER — PHENYLEPHRINE HCL (PRESSORS) 10 MG/ML IV SOLN
INTRAVENOUS | Status: AC
  Filled 2023-11-13: qty 1

## 2023-11-13 MED ORDER — PROPOFOL 10 MG/ML IV BOLUS
INTRAVENOUS | Status: DC | PRN
  Administered 2023-11-13: 180 mg via INTRAVENOUS

## 2023-11-13 MED ORDER — OXYCODONE HCL 5 MG/5ML PO SOLN: 5.0000 mg | Freq: Once | ORAL | Status: DC | PRN

## 2023-11-13 MED ORDER — IOHEXOL 300 MG/ML  SOLN
INTRAMUSCULAR | Status: DC | PRN
  Administered 2023-11-13: 7 mL

## 2023-11-13 MED ORDER — PROPOFOL 10 MG/ML IV BOLUS
INTRAVENOUS | Status: AC
  Filled 2023-11-13: qty 20

## 2023-11-13 MED ORDER — SUGAMMADEX SODIUM 200 MG/2ML IV SOLN
INTRAVENOUS | Status: DC | PRN
  Administered 2023-11-13: 200 mg via INTRAVENOUS

## 2023-11-13 MED ORDER — SODIUM CHLORIDE 0.9 % IV SOLN: INTRAVENOUS | Status: DC

## 2023-11-13 MED ORDER — ONDANSETRON HCL 4 MG/2ML IJ SOLN
INTRAMUSCULAR | Status: DC | PRN
  Administered 2023-11-13: 4 mg via INTRAVENOUS

## 2023-11-13 MED ORDER — MIDAZOLAM HCL 5 MG/5ML IJ SOLN
INTRAMUSCULAR | Status: DC | PRN
  Administered 2023-11-13: 2 mg via INTRAVENOUS

## 2023-11-13 MED ORDER — MIDAZOLAM HCL 2 MG/2ML IJ SOLN
INTRAMUSCULAR | Status: AC
  Filled 2023-11-13: qty 2

## 2023-11-13 MED ORDER — STERILE WATER FOR IRRIGATION IR SOLN
Status: DC | PRN
  Administered 2023-11-13: 1000 mL

## 2023-11-13 MED ORDER — FENTANYL CITRATE (PF) 100 MCG/2ML IJ SOLN
INTRAMUSCULAR | Status: AC
  Filled 2023-11-13: qty 2

## 2023-11-13 MED ORDER — CIPROFLOXACIN IN D5W 400 MG/200ML IV SOLN
400.0000 mg | INTRAVENOUS | Status: AC
  Administered 2023-11-13: 400 mg via INTRAVENOUS
  Filled 2023-11-13: qty 200

## 2023-11-13 MED ORDER — SODIUM CHLORIDE FLUSH 0.9 % IV SOLN
INTRAVENOUS | Status: DC | PRN
  Administered 2023-11-13: 10 mL

## 2023-11-13 MED ORDER — SODIUM CHLORIDE (PF) 0.9 % IJ SOLN
INTRAMUSCULAR | Status: AC
  Filled 2023-11-13: qty 10

## 2023-11-13 MED ORDER — DEXAMETHASONE SOD PHOSPHATE PF 10 MG/ML IJ SOLN
INTRAMUSCULAR | Status: DC | PRN
  Administered 2023-11-13: 10 mg via INTRAVENOUS

## 2023-11-13 MED ORDER — PROPOFOL 10 MG/ML IV BOLUS
INTRAVENOUS | Status: AC
Start: 2023-11-13 — End: 2023-11-13
  Filled 2023-11-13: qty 20

## 2023-11-13 MED ORDER — FLEET ENEMA RE ENEM
1.0000 | ENEMA | Freq: Once | RECTAL | Status: DC
Start: 2023-11-13 — End: 2023-11-13

## 2023-11-13 MED ORDER — EPHEDRINE SULFATE-NACL 50-0.9 MG/10ML-% IV SOSY
PREFILLED_SYRINGE | INTRAVENOUS | Status: DC | PRN
  Administered 2023-11-13 (×3): 5 mg via INTRAVENOUS

## 2023-11-13 MED ORDER — EPHEDRINE 5 MG/ML INJ
INTRAVENOUS | Status: AC
  Filled 2023-11-13: qty 5

## 2023-11-13 MED ORDER — SODIUM CHLORIDE 0.9 % IR SOLN
Status: DC | PRN
  Administered 2023-11-13: 1000 mL

## 2023-11-13 MED ORDER — FLEET ENEMA RE ENEM: 1.0000 | ENEMA | Freq: Once | RECTAL | Status: DC

## 2023-11-13 MED ORDER — FENTANYL CITRATE (PF) 100 MCG/2ML IJ SOLN
INTRAMUSCULAR | Status: DC | PRN
  Administered 2023-11-13: 100 ug via INTRAVENOUS

## 2023-11-13 MED ORDER — SUGAMMADEX SODIUM 200 MG/2ML IV SOLN
INTRAVENOUS | Status: AC
  Filled 2023-11-13: qty 2

## 2023-11-13 MED ORDER — CHLORHEXIDINE GLUCONATE 0.12 % MT SOLN: 15.0000 mL | Freq: Once | OROMUCOSAL | Status: AC

## 2023-11-13 MED ORDER — AMISULPRIDE (ANTIEMETIC) 5 MG/2ML IV SOLN: 10.0000 mg | Freq: Once | INTRAVENOUS | Status: DC | PRN

## 2023-11-13 SURGICAL SUPPLY — 34 items
BAG URINE DRAIN 2000ML AR STRL (UROLOGICAL SUPPLIES) ×1 IMPLANT
BARD QUICKLINK Cartridges with BrachySource I-125 IMPLANT
CATH FOLEY 2WAY SLVR 5CC 16FR (CATHETERS) ×1 IMPLANT
CATH ROBINSON RED A/P 20FR (CATHETERS) ×1 IMPLANT
COVER BACK TABLE 60X90IN (DRAPES) ×1 IMPLANT
COVER MAYO STAND STRL (DRAPES) ×1 IMPLANT
COVER SURGICAL LIGHT HANDLE (MISCELLANEOUS) IMPLANT
DRAPE U-SHAPE 47X51 STRL (DRAPES) IMPLANT
DRSG TEGADERM 4X4.75 (GAUZE/BANDAGES/DRESSINGS) ×2 IMPLANT
DRSG TEGADERM 8X12 (GAUZE/BANDAGES/DRESSINGS) ×2 IMPLANT
GLOVE BIO SURGEON STRL SZ7.5 (GLOVE) ×1 IMPLANT
GLOVE ECLIPSE 8.0 STRL XLNG CF (GLOVE) ×1 IMPLANT
GLOVE SURG LX STRL 7.5 STRW (GLOVE) ×2 IMPLANT
GLOVE SURG ORTHO 8.5 STRL (GLOVE) ×3 IMPLANT
GOWN STRL REUS W/ TWL XL LVL3 (GOWN DISPOSABLE) ×1 IMPLANT
GRID BRACH TEMP 18GA 2.8X3X.75 (MISCELLANEOUS) ×1 IMPLANT
HOLDER FOLEY CATH W/STRAP (MISCELLANEOUS) IMPLANT
IMPL SPACEOAR SYSTEM 10ML (Spacer) ×1 IMPLANT
KIT TURNOVER KIT A (KITS) ×1 IMPLANT
MARKER SKIN DUAL TIP RULER LAB (MISCELLANEOUS) ×1 IMPLANT
NDL BRACHY 18G 5PK (NEEDLE) ×4 IMPLANT
NDL BRACHY 18G SINGLE (NEEDLE) IMPLANT
NDL PK MORGANSTERN STABILIZ (NEEDLE) ×1 IMPLANT
NEEDLE BRACHY 18G 5PK (NEEDLE) ×3 IMPLANT
NEEDLE BRACHY 18G SINGLE (NEEDLE) ×3 IMPLANT
NEEDLE PK MORGANSTERN STABILIZ (NEEDLE) ×1 IMPLANT
PACK CYSTO (CUSTOM PROCEDURE TRAY) ×1 IMPLANT
PENCIL SMOKE EVACUATOR (MISCELLANEOUS) IMPLANT
SHEATH ULTRASOUND LF (SHEATH) IMPLANT
SHEATH ULTRASOUND LTX NONSTRL (SHEATH) IMPLANT
SURGILUBE 2OZ TUBE FLIPTOP (MISCELLANEOUS) ×1 IMPLANT
SYR 10ML LL (SYRINGE) ×1 IMPLANT
TOWEL OR 17X26 10 PK STRL BLUE (TOWEL DISPOSABLE) ×1 IMPLANT
UNDERPAD 30X36 HEAVY ABSORB (UNDERPADS AND DIAPERS) ×2 IMPLANT

## 2023-11-13 NOTE — H&P (Signed)
 H&P  Chief Complaint: prostate cancer   History of Present Illness: 58 year old male diagnosed with a May 2025 unfavorable intermediate risk (PSA 5.2, T1c, Prostate 35 g (PSAD 0.15), GG3, one core, 5%, P4 80%, GG2, five cores, 10-50%, P4 up to 40%, GG1, one core, 5%. pt saw Dr. Renda and Dr Patrcia and elected to proceed with brachytherapy and gel insertion.  He presents today for those procedures.  He has been well without dysuria or gross hematuria.  Past Medical History:  Diagnosis Date   Anxiety    Cataract    removed left eye   Chronic combined systolic and diastolic CHF (congestive heart failure) (HCC)    EF 25-30%   Diastolic dysfunction    Dyslipidemia    Family history of adverse reaction to anesthesia    Mother   Hyperlipidemia    Hypertension    Neuromuscular disorder (HCC)    neuropathy left foot   Nonischemic dilated cardiomyopathy (HCC)    felt secondary to HTN with no ischemia on nuclear stress test 05/2012, EF 25-30%   Prostate cancer (HCC)    Seizures (HCC)    last seizure 05-2020- on Keppra    Shortness of breath    Stroke (HCC) 05/2020   Past Surgical History:  Procedure Laterality Date   COLONOSCOPY WITH PROPOFOL  N/A 12/09/2020   Procedure: COLONOSCOPY WITH PROPOFOL ;  Surgeon: Leigh Elspeth SQUIBB, MD;  [facility];  Service: Gastroenterology;  Laterality: N/A;   KNEE ARTHROSCOPY Left 04/13/2016   Procedure: ARTHROSCOPY KNEE WITH DEBRIDEMENT;  Surgeon: Maude LELON Right, MD;  Location: [facility];  Service: Orthopedics;  Laterality: Left;   KNEE ARTHROSCOPY WITH MEDIAL MENISECTOMY Right 04/13/2016   Procedure: KNEE ARTHROSCOPY WITH MEDIAL MENISECTOMY;  Surgeon: Maude LELON Right, MD;  Location: [facility];  Service: Orthopedics;  Laterality: LEFT not right knee   NASAL FRACTURE SURGERY     PROSTATE BIOPSY     SHOULDER SURGERY Left     Home Medications:  No medications prior to admission.   Allergies:  Allergies   Allergen Reactions   Tizanidine      Other Reaction(s): Angioedema of tongue   Tamsulosin      Other Reaction(s): Not available    Family History  Problem Relation Age of Onset   Hypertension Mother        Passed away when she is 89 secondary to hip surgery complications   CAD Mother    Heart attack Mother    Hypertension Father    Diabetes Brother    Stroke Brother    Heart attack Maternal Grandmother    Colon cancer Neg Hx    Esophageal cancer Neg Hx    Pancreatic cancer Neg Hx    Stomach cancer Neg Hx    Colon polyps Neg Hx    Social History:  reports that he has never smoked. He has been exposed to tobacco smoke. He has never used smokeless tobacco. He reports that he does not currently use alcohol . He reports that he does not currently use drugs after having used the following drugs: Cocaine, Marijuana, and Oxycodone .  ROS: A complete review of systems was performed.  All systems are negative except for pertinent findings as noted. ROS   Physical Exam:  Vital signs in last 24 hours:   General:  Alert and oriented, No acute distress HEENT: Normocephalic, atraumatic Cardiovascular: Regular rate and rhythm Lungs: Regular rate and effort Abdomen: Soft, nontender, nondistended, no abdominal masses Back: No CVA tenderness Extremities:  No edema Neurologic: Grossly intact  Laboratory Data:  No results found for this or any previous visit (from the past 24 hours). No results found for this or any previous visit (from the past 240 hours). Creatinine: Recent Labs    11/08/23 1135  CREATININE 1.38*    Impression/Assessment/plan:  I discussed with the patient the nature, potential benefits, risks and alternatives to prostate brachytherapy and biogel insertion, including side effects of the proposed treatment, the likelihood of the patient achieving the goals of the procedure, and any potential problems that might occur during the procedure or recuperation. We discussed  risk of short and longterm damage to bladder, urethra and rectum among others. I drew him a picture of the anatomy. All questions answered. Patient elects to proceed.    Donnice Brooks 11/13/2023

## 2023-11-13 NOTE — Transfer of Care (Signed)
 Immediate Anesthesia Transfer of Care Note  Patient: [name]  Procedure(s) Performed: INSERTION, RADIATION SOURCE, PROSTATE INJECTION, HYDROGEL SPACER  Patient Location: PACU  Anesthesia Type:General  Level of Consciousness: awake, alert , and oriented  Airway & Oxygen Therapy: Patient Spontanous Breathing and Patient connected to face mask oxygen  Post-op Assessment: Report given to RN and Post -op Vital signs reviewed and stable  Post vital signs: Reviewed and stable  Last Vitals:  Vitals Value Taken Time  BP 143/79 11/13/23 15:13  Temp    Pulse 56 11/13/23 15:15  Resp 11 11/13/23 15:15  SpO2 100 % 11/13/23 15:15  Vitals shown include unfiled device data.  Last Pain:  Vitals:   11/13/23 1000  TempSrc: Oral  PainSc:          Complications: No notable events documented.

## 2023-11-13 NOTE — Progress Notes (Signed)
  Radiation Oncology         [phone] ________________________________  Name: [name] MRN: [id number]  Date: 11/13/2023  DOB: November 14, 1965       Prostate Seed Implant  RR:Duzeyzwd, Amy J, NP  No ref. provider found  DIAGNOSIS:  58 y.o. gentleman with Stage T1c adenocarcinoma of the prostate with Gleason score of 4+3, and PSA of 5.18.   Oncology History  Malignant neoplasm of prostate (HCC)  06/01/2023 Cancer Staging   Staging form: Prostate, AJCC 8th Edition - Clinical stage from 06/01/2023: Stage IIC (cT1c, cN0, cM0, PSA: 5.2, Grade Group: 3) - Signed by Sherwood Rise, PA-C on 10/17/2023 Histopathologic type: Adenocarcinoma, NOS Stage prefix: Initial diagnosis Prostate specific antigen (PSA) range: Less than 10 Gleason primary pattern: 4 Gleason secondary pattern: 3 Gleason score: 7 Histologic grading system: 5 grade system Number of biopsy cores examined: 15 Number of biopsy cores positive: 6 Location of positive needle core biopsies: Both sides   08/16/2023 Initial Diagnosis   Malignant neoplasm of prostate (HCC)     No diagnosis found.  PROCEDURE: Insertion of radioactive I-125 seeds into the prostate gland.  RADIATION DOSE: 145 Gy, definitive therapy.  TECHNIQUE: [name] was brought to the operating room with the urologist. He was placed in the dorsolithotomy position. He was catheterized and a rectal tube was inserted. The perineum was shaved, prepped and draped. The ultrasound probe was then introduced by me into the rectum to see the prostate gland.  TREATMENT DEVICE: I attached the needle grid to the ultrasound probe stand and anchor needles were placed.  3D PLANNING: The prostate was imaged in 3D using a sagittal sweep of the prostate probe. These images were transferred to the planning computer. There, the prostate, urethra and rectum were defined on each axial reconstructed image. Then, the software created an optimized 3D plan and a few seed  positions were adjusted. The quality of the plan was reviewed using [facility] information for the target and the following two organs at risk:  Urethra and Rectum.  Then the accepted plan was printed and handed off to the radiation therapist.  Under my supervision, the custom loading of the seeds and spacers was carried out using the quick loader.  These pre-loaded needles were then placed into the needle holder.SABRA  PROSTATE VOLUME STUDY:  Using transrectal ultrasound the volume of the prostate was verified to be 51.8 cc.  SPECIAL TREATMENT PROCEDURE/SUPERVISION AND HANDLING: The pre-loaded needles were then delivered by the urologist under sagittal guidance. A total of 18 needles were used to deposit 84 seeds in the prostate gland. The individual seed activity was 0.406 mCi.  SpaceOAR:  Yes  COMPLEX SIMULATION: At the end of the procedure, an anterior radiograph of the pelvis was obtained to document seed positioning and count. Cystoscopy was performed by the urologist to check the urethra and bladder.  MICRODOSIMETRY: At the end of the procedure, the patient was emitting 0.104 mR/hr at 1 meter. Accordingly, he was considered safe for hospital discharge.  PLAN: The patient will return to the radiation oncology clinic for post implant CT dosimetry in three weeks.   ________________________________  Donnice FELIX Patrcia, M.D.

## 2023-11-13 NOTE — Op Note (Signed)
 Preoperative diagnosis: Prostate cancer Postoperative diagnosis: Same   Procedure: Prostate brachytherapy seed implant, SpaceOAR biodegradable gel insertion, Cystoscopy   Surgeon: Nieves   Radiation oncologist: Patrcia   Anesthesia: Gen.   Indication for procedure: 58 yo male with prostate cancer who elected to proceed with prostate brachytherapy.   Findings: On fluoroscopic imaging there was adequate coverage of the prostate. On cystoscopy the urethra appeared normal, the prostatic urethra appeared normal, the trigone and ureteral orifices appeared normal with clear efflux. The bladder mucosa appeared normal. There were no stones, foreign bodies or seeds visualized in the bladder.   Dose:145 Gy    Description of procedure: After consent was obtained patient brought to the operating room. After adequate anesthesia he is placed in lithotomy position and the transrectal ultrasound probe and perineal template positioned.  Timeout was performed to confirm the patient and procedure. Catheters and brachytherapy seeds were placed per Dr. Alline plan. A total of 18 catheters and 84 active sources (I-125) were placed. The anchoring needles, template and ultrasound were removed.   The 18-gauge needle was then inserted approximately 1 to 2 cm anterior to the anal opening and directed under ultrasonic guidance into the perirectal fat between the anterior rectal wall and the prostate capsule down to the mid-gland. Midline needle position was confirmed in the sagittal and axial views to verify the tip was in the perirectal fat.  Small amounts of saline were injected to hydrodissect the space between the prostate and the anterior rectal wall.  Axial imaging was viewed to confirm the needle was in the correct location in the mid gland and centered.  Aspiration confirmed no intravascular access.  The saline syringe was carefully disconnected maintaining the desired needle position and the hydrogel was  attached to the needle.  Under ultrasound guidance in the sagittal view a smooth continuous injection was done over about 12 seconds delivering the hydrogel into the space between the prostate and rectal wall.  The needle was withdrawn.  Scout flouro imaging was obtained of the implant. The Foley was removed.  Another image was obtained.  The patient was prepped again and cystoscopy was performed which was noted to be normal. He was awakened taken to the recovery room in stable condition.   Complications: None   Blood loss: Minimal   Specimens: None   Drains: None    Disposition: Patient stable to PACU. Dr. Patrcia and I discussed the procedure, post-op care and follow-up with Nadara Raddle.

## 2023-11-13 NOTE — Telephone Encounter (Signed)
 Message about medical clearance is no longer applicable since to day is 21st. We do not do medical clearance for patients. I am unable to reach [name] to inform about the diclofenac  as the number listed for him is not his number and the gentleman whose number it is was not wanting to take a message. I tried his father's number but there was no answer x2. No other way to communicate with him to not take the diclofenac . We were not the prescriber for the diclofenac .

## 2023-11-13 NOTE — Anesthesia Procedure Notes (Signed)
 Procedure Name: Intubation Date/Time: 11/13/2023 12:52 PM  Performed by: Talik Casique D, CRNAPre-anesthesia Checklist: Patient identified, Emergency Drugs available, Suction available and Patient being monitored Patient Re-evaluated:Patient Re-evaluated prior to induction Oxygen Delivery Method: Circle system utilized Preoxygenation: Pre-oxygenation with 100% oxygen Induction Type: IV induction Ventilation: Mask ventilation without difficulty Laryngoscope Size: Mac and 4 Grade View: Grade III Tube type: Oral Tube size: 7.5 mm Number of attempts: 1 Airway Equipment and Method: Stylet and Oral airway Placement Confirmation: ETT inserted through vocal cords under direct vision, positive ETCO2 and breath sounds checked- equal and bilateral Secured at: 22 cm Tube secured with: Tape Dental Injury: Teeth and Oropharynx as per pre-operative assessment

## 2023-11-14 ENCOUNTER — Encounter (HOSPITAL_COMMUNITY): Payer: Self-pay | Admitting: Urology

## 2023-11-14 NOTE — Anesthesia Postprocedure Evaluation (Signed)
 Anesthesia Post Note  Patient: [name]  Procedure(s) Performed: INSERTION, RADIATION SOURCE, PROSTATE INJECTION, HYDROGEL SPACER     Patient location during evaluation: PACU Anesthesia Type: General Level of consciousness: awake and alert Pain management: pain level controlled Vital Signs Assessment: post-procedure vital signs reviewed and stable Respiratory status: spontaneous breathing, nonlabored ventilation, respiratory function stable and patient connected to nasal cannula oxygen Cardiovascular status: blood pressure returned to baseline and stable Postop Assessment: no apparent nausea or vomiting Anesthetic complications: no   No notable events documented.  Last Vitals:  Vitals:   11/13/23 1645 11/13/23 1700  BP: (!) 159/92 (!) 165/88  Pulse: (!) 54 (!) 59  Resp:  18  Temp:  (!) 36.4 C  SpO2: 100% 100%    Last Pain:  Vitals:   11/13/23 1700  TempSrc:   PainSc: 0-No pain                 Cordella P Tanelle Lanzo

## 2023-11-15 ENCOUNTER — Other Ambulatory Visit: Payer: Self-pay | Admitting: Urology

## 2023-11-15 DIAGNOSIS — C61 Malignant neoplasm of prostate: Secondary | ICD-10-CM

## 2023-11-19 NOTE — Progress Notes (Signed)
 Patient was a RadOnc Consult on 08/16/23 for his stage T1c adenocarcinoma of the prostate with Gleason score of 4+3, and PSA of 5.18. Patient proceed with treatment recommendations of brachytherapy and had his treatment on 11/13/23.   Patient is scheduled for a post seed CT Sim and follow up on 12/06/23.

## 2023-11-21 DIAGNOSIS — G8194 Hemiplegia, unspecified affecting left nondominant side: Secondary | ICD-10-CM | POA: Diagnosis not present

## 2023-11-21 DIAGNOSIS — M19012 Primary osteoarthritis, left shoulder: Secondary | ICD-10-CM | POA: Diagnosis not present

## 2023-11-23 NOTE — Progress Notes (Signed)
 Post-seed nursing interview for a diagnosis of 58 y.o. gentleman with Stage T1c adenocarcinoma of the prostate with Gleason score of 4+3, and PSA of 5.18.    Patient identity verified x2.   Patient states issues as follows...  -Pain:  Denies pain  to pelvis, reports left shoulder pain.  -Fatigue: Yes -Abdomen: No -Groin: No -Urinary: No -Bowels: No -Appetite: Good -Weight:193.6 lbs  Patient denies all other related issues at this time.  Meaningful use complete.  I-PSS (AUA) score- 7 - Mild SHIM (ED) score- 19 Urinary Management medication(s): Flomax  Urology appointment date-  with Dr. Gretel Ferrara at  patients appointment to be mailed per [facility].   Vitals-  BP (!) 142/81 (BP Location: Right Arm, Patient Position: Sitting, Cuff Size: Large)   Pulse 75   Temp 98.1 F (36.7 C)   Resp 20   Ht 5' 9.5 (1.765 m)   Wt 193 lb 9.6 oz (87.8 kg)   SpO2 100%   BMI 28.18 kg/m    This concludes the interaction.

## 2023-11-27 DIAGNOSIS — C61 Malignant neoplasm of prostate: Secondary | ICD-10-CM | POA: Diagnosis not present

## 2023-12-04 ENCOUNTER — Telehealth: Payer: Self-pay | Admitting: *Deleted

## 2023-12-04 NOTE — Telephone Encounter (Signed)
 Called patient to remind of post seed appts. and MRI for 12-06-23, unable to leave message due to voice mail being full

## 2023-12-05 NOTE — Progress Notes (Signed)
  Radiation Oncology         [phone]) [phone] ________________________________  Name: [name] MRN: [id number]  Date: 12/06/2023  DOB: 1965-12-03  COMPLEX SIMULATION NOTE  NARRATIVE:  The patient was brought to the CT Simulation planning suite today following prostate seed implantation approximately one month ago.  Identity was confirmed.  All relevant records and images related to the planned course of therapy were reviewed.  Then, the patient was set-up supine.  CT images were obtained.  The CT images were loaded into the planning software.  Then the prostate and rectum were contoured.  Treatment planning then occurred.  The implanted iodine 125 seeds were identified by the physics staff for projection of radiation distribution  I have requested : 3D Simulation  I have requested a DVH of the following structures: Prostate and rectum.    ________________________________  Donnice FELIX Patrcia, M.D.

## 2023-12-06 ENCOUNTER — Ambulatory Visit (HOSPITAL_COMMUNITY)
Admission: RE | Admit: 2023-12-06 | Discharge: 2023-12-06 | Disposition: A | Discharge status: Home / Self Care | Source: Ambulatory Visit | Attending: Urology | Admitting: Urology

## 2023-12-06 ENCOUNTER — Ambulatory Visit
Admission: RE | Admit: 2023-12-06 | Discharge: 2023-12-06 | Disposition: A | Discharge status: Home / Self Care | Source: Ambulatory Visit | Attending: Radiation Oncology | Admitting: Radiation Oncology

## 2023-12-06 ENCOUNTER — Encounter: Payer: Self-pay | Admitting: Urology

## 2023-12-06 VITALS — BP 142/81 | HR 75 | Temp 98.1°F | Resp 20 | Ht 69.5 in | Wt 193.6 lb

## 2023-12-06 DIAGNOSIS — C61 Malignant neoplasm of prostate: Secondary | ICD-10-CM | POA: Diagnosis not present

## 2023-12-06 NOTE — Progress Notes (Signed)
 Radiation Oncology         [phone] ________________________________  Name: [name] MRN: [id number]  Date: 12/06/2023  DOB: [birth year]  Post-Seed Follow-Up Visit Note  CC: Jaycee Greig PARAS, NP  Renda Glance, MD  Diagnosis:   58 y.o. gentleman with Stage T1c adenocarcinoma of the prostate with Gleason score of 4+3, and PSA of 5.18.     ICD-10-CM   1. Malignant neoplasm of prostate (HCC)  C61       Interval Since Last Radiation:  3 weeks 11/13/23:  Insertion of radioactive I-125 seeds into the prostate gland; 145 Gy, definitive therapy with placement of SpaceOAR gel.  Narrative:  The patient returns today for routine follow-up.  He is complaining of increased urinary frequency and urinary hesitation symptoms. He filled out a questionnaire regarding urinary function today providing and overall IPSS score of 7 characterizing his symptoms as mild. He continues taking Flomax  daily as prescribed and has noticed a weaker flow of stream, intermittency and more urinary frequency but these symptoms are all tolerable and gradually improving. He specifically denies gross hematuria, straining to void or incontinence.  His pre-implant score was 4. He denies any abdominal pain or bowel symptoms. He has a healthy appetite and is maintaining his weight. His energy level is almost back to his baseline at this point and he is excited to get back into his normal routine of walking daily for exercise. Overall, he is quite pleased with his progress to date.  ALLERGIES:  is allergic to tizanidine  and tamsulosin .  Meds: Current Outpatient Medications  Medication Sig Dispense Refill   amLODipine  (NORVASC ) 2.5 MG tablet Take 1 tablet by mouth once daily 90 tablet 0   aspirin  EC 81 MG tablet Take 81 mg by mouth daily. Swallow whole.     atorvastatin  (LIPITOR) 80 MG tablet Take 1 tablet (80 mg total) by mouth daily. 90 tablet 1   baclofen  (LIORESAL ) 10 MG tablet Take 1 tablet (10 mg total) by mouth at  bedtime. 90 each 3   diclofenac  (VOLTAREN ) 50 MG EC tablet Take 1 tablet (50 mg total) by mouth 2 (two) times daily. 30 tablet 1   folic acid  (FOLVITE ) 1 MG tablet Take 1 tablet by mouth once daily 30 tablet 0   gabapentin  (NEURONTIN ) 300 MG capsule Take 1 capsule (300 mg total) by mouth 2 (two) times daily AND 2 capsules (600 mg total) at bedtime. 360 capsule 3   labetalol  (NORMODYNE ) 100 MG tablet Take 1 tablet (100 mg total) by mouth 2 (two) times daily. 180 tablet 0   losartan  (COZAAR ) 25 MG tablet Take 1 tablet (25 mg total) by mouth daily. 90 tablet 3   Multiple Vitamins-Minerals (CERTAVITE/ANTIOXIDANTS) TABS Take 1 tablet by mouth daily. 30 tablet 0   sildenafil (REVATIO) 20 MG tablet Take 1-3 tablets by mouth as needed (ED).     tadalafil (CIALIS) 5 MG tablet Take 5 mg by mouth every morning.     tamsulosin  (FLOMAX ) 0.4 MG CAPS capsule Take 0.4 mg by mouth daily at 6 (six) AM. (Patient not taking: Reported on 11/08/2023)     thiamine  100 MG tablet Take 1 tablet (100 mg total) by mouth daily. 30 tablet 0   topiramate  (TOPAMAX ) 100 MG tablet Take 100 mg by mouth 2 (two) times daily.     No current facility-administered medications for this visit.    Physical Findings: In general this is a well appearing African American male in no acute distress. He's  alert and oriented x4 and appropriate throughout the examination. Cardiopulmonary assessment is negative for acute distress and he exhibits normal effort.   Lab Findings: Lab Results  Component Value Date   WBC 4.7 11/23/2021   HGB 13.9 11/23/2021   HCT 42.0 11/23/2021   MCV 92 11/23/2021   PLT 314 11/23/2021    Radiographic Findings:  Patient underwent CT imaging in our clinic for post implant dosimetry. The CT will be fused with his prostate MRI that will be performed at 2:30pm this afternoon and will be reviewed by Dr. Patrcia to confirm there is an adequate distribution of radioactive seeds throughout the prostate gland and  ensure that there are no seeds in or near the rectum. We suspect the final radiation plan and dosimetry will show appropriate coverage of the prostate gland. He understands that we will call and inform him of any unexpected findings on further review of his imaging and dosimetry.  Impression/Plan: 58 y.o. gentleman with Stage T1c adenocarcinoma of the prostate with Gleason score of 4+3, and PSA of 5.18.  The patient is recovering from the effects of radiation. His urinary symptoms should gradually improve over the next 4-6 months. We talked about this today. He is encouraged by his improvement already and is otherwise pleased with his outcome. We also talked about long-term follow-up for prostate cancer following seed implant. He understands that ongoing PSA determinations and digital rectal exams will help perform surveillance to rule out disease recurrence. He had a follow up visit in the urology office last week and will be scheduled for his initial post-treatment PSA and follow up with Dr. Nieves in Jan. 2026. He understands what to expect with his PSA measures. Patient was also educated today about some of the long-term effects from radiation including a small risk for rectal bleeding and possibly erectile dysfunction. We talked about some of the general management approaches to these potential complications. However, I did encourage the patient to contact our office or return at any point if he has questions or concerns related to his previous radiation and prostate cancer.    Sabra MICAEL Rusk, PA-C

## 2023-12-13 ENCOUNTER — Encounter: Payer: Self-pay | Admitting: Radiation Oncology

## 2023-12-13 DIAGNOSIS — C61 Malignant neoplasm of prostate: Secondary | ICD-10-CM | POA: Diagnosis not present

## 2023-12-13 DIAGNOSIS — Z191 Hormone sensitive malignancy status: Secondary | ICD-10-CM | POA: Diagnosis not present

## 2023-12-13 NOTE — Radiation Completion Notes (Signed)
 Patient Name: DUSTON, SMOLENSKI MRN: [id number] Date of Birth: 03/02/1965 Referring Physician: NORETTA FERRARA, M.D. Date of Service: 2023-12-13 Radiation Oncologist: Adina Barge, M.D. [facility]                             RADIATION ONCOLOGY END OF TREATMENT NOTE     Diagnosis: C61 Malignant neoplasm of prostate Staging on 2023-06-01: Malignant neoplasm of prostate (HCC) T=cT1c, N=cN0, M=cM0 Intent: Curative     ==========DELIVERED PLANS==========  Prostate Seed Implant Date: 2023-11-13   Plan Name: Prostate Seed Implant Site: Prostate Technique: Radioactive Seed Implant I-125 Mode: Brachytherapy Dose Per Fraction: 145 Gy Prescribed Dose (Delivered / Prescribed): 145 Gy / 145 Gy Prescribed Fxs (Delivered / Prescribed): 1 / 1     ==========ON TREATMENT VISIT DATES========== 2023-11-13     ==========UPCOMING VISITS========== 02/27/2024 [facility] OFFICE VISIT Jaycee Greig PARAS, NP  02/11/2024 [facility] FOLLOW UP Lorilee Sven SQUIBB, MD  01/14/2024 [facility] OFFICE VISIT Arlinda Buster, MD

## 2023-12-13 NOTE — Progress Notes (Signed)
  Radiation Oncology         [phone]) [phone] ________________________________  Name: [name] MRN: [id number]  Date: 12/13/2023  DOB: May 06, 1965  3D Planning Note   Prostate Brachytherapy Post-Implant Dosimetry  Diagnosis:  58 y.o. gentleman with Stage T1c adenocarcinoma of the prostate with Gleason score of 4+3, and PSA of 5.18.   Narrative: On a previous date, [name] returned following prostate seed implantation for post implant planning. He underwent CT scan complex simulation to delineate the three-dimensional structures of the pelvis and demonstrate the radiation distribution.  Since that time, the seed localization, and complex isodose planning with dose volume histograms have now been completed.  Results:   Prostate Coverage - The dose of radiation delivered to the 90% or more of the prostate gland (D90) was 106.14% of the prescription dose. This exceeds our goal of greater than 90%. Rectal Sparing - The volume of rectal tissue receiving the prescription dose or higher was 0.48 cc. This falls under our thresholds tolerance of 1.0 cc.  Impression: The prostate seed implant appears to show adequate target coverage and appropriate rectal sparing.  Plan:  The patient will continue to follow with urology for ongoing PSA determinations. I would anticipate a high likelihood for local tumor control with minimal risk for rectal morbidity.  ________________________________  Donnice FELIX Patrcia, M.D.

## 2024-01-07 ENCOUNTER — Ambulatory Visit: Admitting: Adult Health

## 2024-01-10 NOTE — Progress Notes (Unsigned)
 Left hand pain; last seen May for left UE weakness

## 2024-01-14 ENCOUNTER — Ambulatory Visit: Admitting: Orthopedic Surgery

## 2024-01-14 DIAGNOSIS — R29898 Other symptoms and signs involving the musculoskeletal system: Secondary | ICD-10-CM

## 2024-01-18 ENCOUNTER — Other Ambulatory Visit: Payer: Self-pay | Admitting: Physical Medicine and Rehabilitation

## 2024-01-29 ENCOUNTER — Other Ambulatory Visit: Payer: Self-pay | Admitting: Physical Medicine and Rehabilitation

## 2024-01-29 ENCOUNTER — Telehealth: Payer: Self-pay | Admitting: Physical Medicine and Rehabilitation

## 2024-01-29 DIAGNOSIS — I10 Essential (primary) hypertension: Secondary | ICD-10-CM

## 2024-01-29 NOTE — Telephone Encounter (Signed)
 Pt came to the front and ask for med refill amoldipine 2.5 mg and labetalo 100 mg

## 2024-02-03 ENCOUNTER — Other Ambulatory Visit: Payer: Self-pay | Admitting: Physical Medicine and Rehabilitation

## 2024-02-03 DIAGNOSIS — I10 Essential (primary) hypertension: Secondary | ICD-10-CM

## 2024-02-11 ENCOUNTER — Encounter: Attending: Physical Medicine and Rehabilitation | Admitting: Physical Medicine and Rehabilitation

## 2024-02-11 ENCOUNTER — Encounter: Payer: Self-pay | Admitting: Physical Medicine and Rehabilitation

## 2024-02-11 VITALS — BP 144/73 | HR 82 | Ht 69.5 in | Wt 197.4 lb

## 2024-02-11 DIAGNOSIS — G8929 Other chronic pain: Secondary | ICD-10-CM | POA: Insufficient documentation

## 2024-02-11 DIAGNOSIS — N189 Chronic kidney disease, unspecified: Secondary | ICD-10-CM | POA: Diagnosis not present

## 2024-02-11 DIAGNOSIS — G629 Polyneuropathy, unspecified: Secondary | ICD-10-CM | POA: Diagnosis not present

## 2024-02-11 DIAGNOSIS — R252 Cramp and spasm: Secondary | ICD-10-CM | POA: Diagnosis not present

## 2024-02-11 DIAGNOSIS — S42035A Nondisplaced fracture of lateral end of left clavicle, initial encounter for closed fracture: Secondary | ICD-10-CM | POA: Diagnosis not present

## 2024-02-11 DIAGNOSIS — M25512 Pain in left shoulder: Secondary | ICD-10-CM | POA: Insufficient documentation

## 2024-02-11 MED ORDER — TAMSULOSIN HCL 0.4 MG PO CAPS: 0.4000 mg | ORAL_CAPSULE | Freq: Every day | ORAL | 3 refills | Status: AC

## 2024-02-11 MED ORDER — ATORVASTATIN CALCIUM 40 MG PO TABS: 40.0000 mg | ORAL_TABLET | Freq: Every day | ORAL | 3 refills | Status: AC

## 2024-02-11 MED ORDER — ATORVASTATIN CALCIUM 40 MG PO TABS: 40.0000 mg | ORAL_TABLET | Freq: Every day | ORAL | 3 refills | Status: DC

## 2024-02-11 NOTE — Progress Notes (Signed)
 Order notes emailed to [email or web address] for cryo orthosis.

## 2024-02-11 NOTE — Progress Notes (Signed)
 "  Subjective:    Patient ID: [name], male    DOB: 1965-10-21, 59 y.o.   MRN: [id number]  HPI [name] is a 59 year old man who presents for follow-up of CVA  1) Overweight: -has lost a lot of weight! -he is scared to eat his wife's food  -discussed that weight is down to 192  lbs -tries to do 10 laps back and forth -he now does 17 laps  2) Problems with spouse -his divorce is going to be finalized next week -he got accepted in a two bedroom -under adult protective services now -she has been hacking into his phone.  -his wife has a new boyfriend and he thinks this has influenced her -his ex forged his signature with the IRS  -discussed that his wife came looking for him at the [place] [facility] and his [facility] is supportive of him.  -his wife hacked his phone  3) CVA -he is doing well -he asks for refill of gabapentin  for his neuropathy -he is walking -using the cane -nerve pain has been stable  4) HTN: -BP is stable today -he has been off labetalol  for a few days  5) Neuropathy: -needs refill of gabapentin  -was told that his nerves are good.   6) Left shoulder pain: -has been following with orthopedic and has been treated with steroid injections  7) Right knee pain -felt a pop when he was going to the dining hall at the [place] [facility] -has lost power when he is trying to go up steps -they told him in therapy that they may have worn out his right side.  -still hurting -voltaren  gel is not helping, he is only using at night  8) Left wrist pain: -he would like to see ortho for a new brace  9) Left clavicle fracture: -swiveled off chair onto concrete -went to the gym and walked for a mile and went back to the center and saw that something did not look right in his shoulder -the next morning he went to urgent care and was found that he fractured left clavicle -she was given a stabilizer which he has to wear for 1 month and no activity 4-6 weeks  10) Left shoulder  pain: -he got a steroid injection  -pain stays a constant 9 at this time -he asks about pain medication but does not want anything that will sedate him -Cr 1.38 -he is on aspirin   11) Impaired kidney function: -he still has not received compensation with [place] toxic water  exposure- he thinks this is the cause of the impaired kidney function    Pain Inventory Average Pain 9 Pain Right Now 9 My pain is intermittent and aching, stabbing  LOCATION OF PAIN  Shoulder, Hand, Fingers, Leg, Toes  In the last 24 hours, has pain interfered with the following? General activity 5 Relation with others 3 Enjoyment of life 7 What TIME of day is your pain at its worst? daytime Sleep (in general) Fair  Pain is worse with: inactivity Pain improves with: therapy/exercise and injections Relief from Meds: 6   Family History  Problem Relation Age of Onset   Hypertension Mother        Passed away when she is 81 secondary to hip surgery complications   CAD Mother    Heart attack Mother    Hypertension Father    Diabetes Brother    Stroke Brother    Heart attack Maternal Grandmother    Colon cancer Neg  Hx    Esophageal cancer Neg Hx    Pancreatic cancer Neg Hx    Stomach cancer Neg Hx    Colon polyps Neg Hx    Social History   Socioeconomic History   Marital status: Married    Spouse name: Not on file   Number of children: Not on file   Years of education: Not on file   Highest education level: Not on file  Occupational History   Not on file  Tobacco Use   Smoking status: Never    Passive exposure: Past   Smokeless tobacco: Never  Vaping Use   Vaping status: Never Used  Substance and Sexual Activity   Alcohol  use: Not Currently   Drug use: Not Currently    Types: Cocaine, Marijuana, Oxycodone     Comment: uses CBD oil on feet.   Sexual activity: Yes  Other Topics Concern   Not on file  Social History Narrative   Not on file   Social Drivers of Health   Tobacco  Use: Low Risk (11/13/2023)   Patient History    Smoking Tobacco Use: Never    Smokeless Tobacco Use: Never    Passive Exposure: Past  Recent Concern: Tobacco Use - Medium Risk (11/06/2023)   Patient History    Smoking Tobacco Use: Former    Smokeless Tobacco Use: Never    Passive Exposure: Past  Physicist, Medical Strain: Low Risk (08/27/2023)   Overall Financial Resource Strain (CARDIA)    Difficulty of Paying Living Expenses: Not hard at all  Food Insecurity: No Food Insecurity (08/16/2023)   Epic    Worried About Radiation Protection Practitioner of Food in the Last Year: Never true    Ran Out of Food in the Last Year: Never true  Transportation Needs: Unmet Transportation Needs (08/16/2023)   Epic    Lack of Transportation (Medical): Yes    Lack of Transportation (Non-Medical): No  Physical Activity: Sufficiently Active (08/27/2023)   Exercise Vital Sign    Days of Exercise per Week: 5 days    Minutes of Exercise per Session: 30 min  Stress: No Stress Concern Present (08/27/2023)   [employer] of Occupational Health - Occupational Stress Questionnaire    Feeling of Stress: Not at all  Social Connections: Unknown (06/07/2021)   Received from [facility]   Social Network    Social Network: Not on file  Depression (PHQ2-9): Low Risk (08/27/2023)   Depression (PHQ2-9)    PHQ-2 Score: 0  Alcohol  Screen: Low Risk (08/16/2023)   Alcohol  Screen    Last Alcohol  Screening Score (AUDIT): 0  Housing: Low Risk (08/16/2023)   Epic    Unable to Pay for Housing in the Last Year: No    Number of Times Moved in the Last Year: 1    Homeless in the Last Year: No  Utilities: Not At Risk (08/16/2023)   Epic    Threatened with loss of utilities: No  Health Literacy: Adequate Health Literacy (08/27/2023)   B1300 Health Literacy    Frequency of need for help with medical instructions: Never   Past Surgical History:  Procedure Laterality Date   COLONOSCOPY WITH PROPOFOL  N/A 12/09/2020   Procedure: COLONOSCOPY WITH  PROPOFOL ;  Surgeon: Leigh Elspeth SQUIBB, MD;  Location: [facility];  Service: Gastroenterology;  Laterality: N/A;   KNEE ARTHROSCOPY Left 04/13/2016   Procedure: ARTHROSCOPY KNEE WITH DEBRIDEMENT;  Surgeon: Maude LELON Right, MD;  Location: [facility];  Service: Orthopedics;  Laterality: Left;  KNEE ARTHROSCOPY WITH MEDIAL MENISECTOMY Right 04/13/2016   Procedure: KNEE ARTHROSCOPY WITH MEDIAL MENISECTOMY;  Surgeon: Maude LELON Right, MD;  Location: [facility];  Service: Orthopedics;  Laterality: LEFT not right knee   NASAL FRACTURE SURGERY     PROSTATE BIOPSY     RADIOACTIVE SEED IMPLANT N/A 11/13/2023   Procedure: INSERTION, RADIATION SOURCE, PROSTATE;  Surgeon: Nieves Cough, MD;  Location: [facility];  Service: Urology;  Laterality: N/A;   SHOULDER SURGERY Left    SPACE OAR INSTILLATION N/A 11/13/2023   Procedure: INJECTION, HYDROGEL SPACER;  Surgeon: Nieves Cough, MD;  Location: [facility];  Service: Urology;  Laterality: N/A;   Past Medical History:  Diagnosis Date   Anxiety    Cataract    removed left eye   Chronic combined systolic and diastolic CHF (congestive heart failure) (HCC)    EF 25-30%   Diastolic dysfunction    Dyslipidemia    Family history of adverse reaction to anesthesia    Mother   Hyperlipidemia    Hypertension    Neuromuscular disorder (HCC)    neuropathy left foot   Nonischemic dilated cardiomyopathy (HCC)    felt secondary to HTN with no ischemia on nuclear stress test 05/2012, EF 25-30%   Prostate cancer (HCC)    Seizures (HCC)    last seizure 05-2020- on Keppra    Shortness of breath    Stroke (HCC) 05/2020   There were no vitals taken for this visit.  Opioid Risk Score:   Fall Risk Score:  `1  Depression screen [facility] 2/9     08/27/2023   10:51 AM 08/16/2023   10:54 AM 01/29/2023    1:24 PM 10/24/2022    1:18 PM 08/07/2022   11:12 AM 03/10/2022   10:40 AM 02/20/2022    1:40 PM  Depression screen PHQ 2/9  Decreased  Interest 0 0 0 0 0 0 0  Down, Depressed, Hopeless 0 0 0 0 0 0 1  PHQ - 2 Score 0 0 0 0 0 0 1  Altered sleeping       0  Tired, decreased energy       0  Change in appetite       1  Feeling bad or failure about yourself        0  Trouble concentrating       0  Moving slowly or fidgety/restless       0  Suicidal thoughts       0  PHQ-9 Score       2   Difficult doing work/chores       Somewhat difficult     Data saved with a previous flowsheet row definition    Gen: no distress, normal appearing HEENT: oral mucosa pink and moist, NCAT Cardio: Reg rate Chest: normal effort, normal rate of breathing Abd: soft, non-distended Ext: no edema Psych: pleasant, normal affect Skin: intact MSK: Left hand and foot weakness. Ambulating with cane, left arm in sling Right knee TTP, left wrist brace, stable 1/19, +left shoulder TTP and instability with ROM testing     Family History  Problem Relation Age of Onset   Hypertension Mother        Passed away when she is 17 secondary to hip surgery complications   CAD Mother    Heart attack Mother    Hypertension Father    Diabetes Brother    Stroke Brother    Heart attack Maternal Grandmother    Colon  cancer Neg Hx    Esophageal cancer Neg Hx    Pancreatic cancer Neg Hx    Stomach cancer Neg Hx    Colon polyps Neg Hx    Social History   Socioeconomic History   Marital status: Married    Spouse name: Not on file   Number of children: Not on file   Years of education: Not on file   Highest education level: Not on file  Occupational History   Not on file  Tobacco Use   Smoking status: Never    Passive exposure: Past   Smokeless tobacco: Never  Vaping Use   Vaping status: Never Used  Substance and Sexual Activity   Alcohol  use: Not Currently   Drug use: Not Currently    Types: Cocaine, Marijuana, Oxycodone     Comment: uses CBD oil on feet.   Sexual activity: Yes  Other Topics Concern   Not on file  Social History Narrative    Not on file   Social Drivers of Health   Tobacco Use: Low Risk (11/13/2023)   Patient History    Smoking Tobacco Use: Never    Smokeless Tobacco Use: Never    Passive Exposure: Past  Recent Concern: Tobacco Use - Medium Risk (11/06/2023)   Patient History    Smoking Tobacco Use: Former    Smokeless Tobacco Use: Never    Passive Exposure: Past  Physicist, Medical Strain: Low Risk (08/27/2023)   Overall Financial Resource Strain (CARDIA)    Difficulty of Paying Living Expenses: Not hard at all  Food Insecurity: No Food Insecurity (08/16/2023)   Epic    Worried About Radiation Protection Practitioner of Food in the Last Year: Never true    Ran Out of Food in the Last Year: Never true  Transportation Needs: Unmet Transportation Needs (08/16/2023)   Epic    Lack of Transportation (Medical): Yes    Lack of Transportation (Non-Medical): No  Physical Activity: Sufficiently Active (08/27/2023)   Exercise Vital Sign    Days of Exercise per Week: 5 days    Minutes of Exercise per Session: 30 min  Stress: No Stress Concern Present (08/27/2023)   [employer] of Occupational Health - Occupational Stress Questionnaire    Feeling of Stress: Not at all  Social Connections: Unknown (06/07/2021)   Received from [facility]   Social Network    Social Network: Not on file  Depression (PHQ2-9): Low Risk (08/27/2023)   Depression (PHQ2-9)    PHQ-2 Score: 0  Alcohol  Screen: Low Risk (08/16/2023)   Alcohol  Screen    Last Alcohol  Screening Score (AUDIT): 0  Housing: Low Risk (08/16/2023)   Epic    Unable to Pay for Housing in the Last Year: No    Number of Times Moved in the Last Year: 1    Homeless in the Last Year: No  Utilities: Not At Risk (08/16/2023)   Epic    Threatened with loss of utilities: No  Health Literacy: Adequate Health Literacy (08/27/2023)   B1300 Health Literacy    Frequency of need for help with medical instructions: Never   Past Surgical History:  Procedure Laterality Date   COLONOSCOPY WITH  PROPOFOL  N/A 12/09/2020   Procedure: COLONOSCOPY WITH PROPOFOL ;  Surgeon: Leigh Elspeth SQUIBB, MD;  Location: [facility];  Service: Gastroenterology;  Laterality: N/A;   KNEE ARTHROSCOPY Left 04/13/2016   Procedure: ARTHROSCOPY KNEE WITH DEBRIDEMENT;  Surgeon: Maude LELON Right, MD;  Location: [facility];  Service: Orthopedics;  Laterality: Left;  KNEE ARTHROSCOPY WITH MEDIAL MENISECTOMY Right 04/13/2016   Procedure: KNEE ARTHROSCOPY WITH MEDIAL MENISECTOMY;  Surgeon: Maude LELON Right, MD;  Location: [facility];  Service: Orthopedics;  Laterality: LEFT not right knee   NASAL FRACTURE SURGERY     PROSTATE BIOPSY     RADIOACTIVE SEED IMPLANT N/A 11/13/2023   Procedure: INSERTION, RADIATION SOURCE, PROSTATE;  Surgeon: Nieves Cough, MD;  Location: [facility];  Service: Urology;  Laterality: N/A;   SHOULDER SURGERY Left    SPACE OAR INSTILLATION N/A 11/13/2023   Procedure: INJECTION, HYDROGEL SPACER;  Surgeon: Nieves Cough, MD;  Location: [facility];  Service: Urology;  Laterality: N/A;   Past Medical History:  Diagnosis Date   Anxiety    Cataract    removed left eye   Chronic combined systolic and diastolic CHF (congestive heart failure) (HCC)    EF 25-30%   Diastolic dysfunction    Dyslipidemia    Family history of adverse reaction to anesthesia    Mother   Hyperlipidemia    Hypertension    Neuromuscular disorder (HCC)    neuropathy left foot   Nonischemic dilated cardiomyopathy (HCC)    felt secondary to HTN with no ischemia on nuclear stress test 05/2012, EF 25-30%   Prostate cancer (HCC)    Seizures (HCC)    last seizure 05-2020- on Keppra    Shortness of breath    Stroke (HCC) 05/2020   There were no vitals taken for this visit.  Opioid Risk Score:   Fall Risk Score:  `1  Depression screen [facility] 2/9     08/27/2023   10:51 AM 08/16/2023   10:54 AM 01/29/2023    1:24 PM 10/24/2022    1:18 PM 08/07/2022   11:12 AM 03/10/2022   10:40 AM  02/20/2022    1:40 PM  Depression screen PHQ 2/9  Decreased Interest 0 0 0 0 0 0 0  Down, Depressed, Hopeless 0 0 0 0 0 0 1  PHQ - 2 Score 0 0 0 0 0 0 1  Altered sleeping       0  Tired, decreased energy       0  Change in appetite       1  Feeling bad or failure about yourself        0  Trouble concentrating       0  Moving slowly or fidgety/restless       0  Suicidal thoughts       0  PHQ-9 Score       2   Difficult doing work/chores       Somewhat difficult     Data saved with a previous flowsheet row definition    Review of Systems  Musculoskeletal:  Positive for gait problem.       Pain in Shoulder, Hand, Fingers, Leg, Toes  Neurological:        Tingling  Psychiatric/Behavioral:         Anxiety  All other systems reviewed and are negative.      Assessment & Plan:   [name] is a 59 year old man presenting today for f/u of left hemiplegia, right sided low back pain, and left sided foot pain following stroke, left shoulder pain.  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  -continue atorvastatin  -continue daily exercise at [facility] -refilled atorvastatin  for mixed  HLD -discussed that he is going to the gym, continue going 6 days per week.  -discussed that he has a good social community at [employer], the gym, and the [place] -referred to PCP -referred to PT --progress to quad walker -face to face for AFO performed -discussed that most motor improvement occurs in the first 4 weeks, but improvement is still seen in the first 6 months, and some improvement may still be seen in the first 2 weeks -evaluated for vagal nerve stimulation trial for unfortunately will not qualify due to his stroke being hemorrhagic -will send him information about deep brain stimulation, discussed with him that I have seen this help one of my patients -Incredible  improvement in ambulation!  -discussed that he is doing 10 laps daily and feeling great -refilled gabapentin  -graduated from therapy -Neuropathy is moving up to neck.  -was released from the hospital one year today -she is doing exercises.  -left hand is getting on his nerves -he thinks -his therapists are planning to take him to the pool and told him this may help with his arm pain -pain has been severe at home, as it had been in hospital -his wife purchased 500mg  tablets of tylenol  -he is worried about his decreased upper extremity strength and asks if this will ever improve -he is not experiencing much shoulder pain right now -he does have some strength -the benefits from the trigger point injections lwore off within 1 week -pain is severely limiting his function -he is taking Tizanidine  and it does not help, does make him sleepy, but his wife is trying to encourage him to stay up during the day -he developed the foot pain a couple of weeks ago -he was wearing a boot at that time so took it off but has still had pain a couple of weeks afterward. -he has not tried anything for this pain -it is worst at night and feels like burning and tingling at his heel. -he has been doing great with therapy and is walking in his home! -his wife asks if he could get a handicap placard.  -the foot is still feeling numb with pins and needles, also painful. He is unsure whether Gabapentin  helps- so far nothing he as tried has helped his neuropathy. His balance is also now off and he has been stumbling more. He actually felt that the Gabapentin  made his pain worse. He has never tried Cymbalta  but is willing to try it.  -Has been able to use bathroom himself! -he is working with therapy right now.  -he was feeling wobbly over the weekend so had to miss his PT session -shoulder is feeling very numb and cannot make a fist -ready to proceed with Qutenza  today -His wife asks for refill of Eucerin and  Diclofenac  and Tizanidine .  -She asks whether he should still be taking Seroquel  -he called medical records and records were faxed on December 28th.  -He called our office today  -He needs medical records to process his disability claims -able to walk much better.  -his wife mentioned to me that she is still worried about him driving. He has been driving around locally to pharmacy   2) Lower extremity edema -worsening -not painful -he asks when his next appointment with me is.  -has gotten used to it -therapist was concerned -painful -sometimes feels cold on the left foot -never took the fluid pills -he is hoping to walk more now that the weather has improved D/c amlodipine   3)  Headaches -started last week and felt loss of balance -this stopped him from doing therapy.  -still having tension in head.  -he takes topamax  25mg  HS.   4) Obesity -he has lost 64 lbs! -discussed that current weight is 186 lbs  5) Left shoulder pain -intermittent -would like cryo-othosis and tens unit, prescribed both given presence of pain and instability -is interested in PT in the future -discussed that he is not interested in surgery -sometimes he uses voltaren  gel and this seems to help the most -discussed that he has been following with ortho and benefitting from injections -discussed that he has 2 hearings coming up regarding his shoulder.   6) Left upper extremity neuropathy -started last month -received steroid injection 2 months ago. Asks if this is a side effect of the steroid injection -asks about a supplement and if this is ok to take -he asks if there is anything else he can do for this -refilled gabapentin   7) Cervical myofascial pain syndrome -he wants to know if injections could help  8) Neuropathy -refilled gabapentin  300mg  TID -discussed that he has been weaning himself off the topamax   9) Urinary frequency: -notes that this started after taking the Baclofen , but his  wife said she never gave it to him -he did take the cymbalta  but stopped due to this potential side effect -he is urinating every 5-10 minutes -he is willing to get UA and UC drawn today.  -refilled flomax   10) Problems with wife -discussed that he has a  -discussed that his divorce will be finalized next week -he believes his wife is trying to tell his healthcare providers he has dementia -he also feels that one day his cane was filled with water  to make it more difficult for him to use and that she did this, he says this was witnessed by his therapy team -his family is unaware of this as they have moved and he has not yet discussed with them -he would like his medical records to be restricted from his wife.  -he asks if I can help him find alternative housing -discussed that the [place] is giving him good support -discussed that his wife is tracking to hack his phone.  -discussed that his wife was falsely saying that she had dementia. -discussed that he ran into her at the bank.  -discussed that step daughter teamed up with her    42) HTN: -refilled labetalol  --refilled losartan  and tamsulosin  -d/c tizanidine  -decrease losartan  to 25mg  daily -Advised checking BP daily at home and logging results to bring into follow-up appointment with PCP and myself. -Reviewed BP meds today.  -Advised regarding healthy foods that can help lower blood pressure and provided with a list: 1) citrus foods- high in vitamins and minerals 2) salmon and other fatty fish - reduces inflammation and oxylipins 3) swiss chard (leafy green)- high level of nitrates 4) pumpkin seeds- one of the best natural sources of magnesium 5) Beans and lentils- high in fiber, magnesium, and potassium 6) Berries- high in flavonoids 7) Amaranth (whole grain, can be cooked similarly to rice and oats)- high in magnesium and fiber 8) Pistachios- even more effective at reducing BP than other nuts 9) Carrots- high in phenolic  compounds that relax blood vessels and reduce inflammation 10) Celery- contain phthalides that relax tissues of arterial walls 11) Tomatoes- can also improve cholesterol and reduce risk of heart disease 12) Broccoli- good source of magnesium, calcium , and potassium 13) Greek yogurt: high in potassium and  calcium  14) Herbs and spices: Celery seed, cilantro, saffron, lemongrass, black cumin, ginseng, cinnamon, cardamom, sweet basil, and ginger 15) Chia and flax seeds- also help to lower cholesterol and blood sugar 16) Beets- high levels of nitrates that relax blood vessels  17) spinach and bananas- high in potassium  -Provided lise of supplements that can help with hypertension:  1) magnesium: one high quality brand is [employer] since it contains all 7 types of magnesium, otherwise over the counter magnesium gluconate 400mg  is a good option 2) B vitamins 3) vitamin D 4) potassium 5) CoQ10 6) L-arginine 7) Vitamin C 8) Beetroot -Educated that goal BP is 120/80. -Made goal to incorporate some of the above foods into diet.    12) Left hand weakness: -ordered OT, continue HEP  13) Tongue swelling:  -d/c tizanidine   14) Right knee pain:  -XR ordered, discussed spurring -recommended neoprene knee sleeve  15) Left wrist pain: -continue brace -referred to Dr. Erwin  16. Elevated PSA: -discussed that he had a biopsy and the results are pending -discussed stress reduction, meditation, avoiding added sugars, eating fruits and vegetables  17. Edema:  -discussed to monitor for shortness of breath -continue walking   18. Left clavicle fracture: -continue sling -discussed that he continues to have pain  19. Prostate cancer: -discussed that he is going to be doing the prostate seeds -discussed that he has stage 2 cancer -discussed that exercise is helpful for cancer prevention as well as ability to tolerate the surgery -discussed that his father had prostate cancer as well  20.  Spasticity:  -continue baclofen  10mg  HS -discussed botox  21. Lumbar radiculopathy: -discussed PT but he is busy   40 minutes spent in discussion of his follow-up with neurology, his left shoulder pain, discussed that diclofenac  can worsen his kidney function, discussed that he is on gabapentin /aspirin /baclofen  for pain, discussed that he had a steroid injection which provided some relief but that pain is constantly 9/10, discussed that he is currently receiving treatment for prostate cancer, discussed that baclofen  is helping his spasticity and that he continues to maintain a stretching regimen, discussed that he is also regular about going to the gym  "

## 2024-02-12 ENCOUNTER — Encounter: Payer: Self-pay | Admitting: *Deleted

## 2024-02-27 ENCOUNTER — Encounter: Payer: Self-pay | Admitting: Family

## 2024-02-27 ENCOUNTER — Other Ambulatory Visit: Payer: Self-pay | Admitting: Family

## 2024-02-27 ENCOUNTER — Ambulatory Visit: Payer: Self-pay | Admitting: Family

## 2024-02-27 VITALS — BP 134/78 | HR 72 | Ht 70.0 in | Wt 197.0 lb

## 2024-02-27 DIAGNOSIS — Z8673 Personal history of transient ischemic attack (TIA), and cerebral infarction without residual deficits: Secondary | ICD-10-CM

## 2024-02-27 DIAGNOSIS — Z130 Encounter for screening for diseases of the blood and blood-forming organs and certain disorders involving the immune mechanism: Secondary | ICD-10-CM

## 2024-02-27 DIAGNOSIS — Z131 Encounter for screening for diabetes mellitus: Secondary | ICD-10-CM

## 2024-02-27 DIAGNOSIS — R040 Epistaxis: Secondary | ICD-10-CM
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
## 2024-02-28 ENCOUNTER — Ambulatory Visit: Payer: Self-pay | Admitting: Family

## 2024-02-28 ENCOUNTER — Encounter: Payer: Self-pay | Admitting: *Deleted

## 2024-02-28 DIAGNOSIS — R7989 Other specified abnormal findings of blood chemistry: Secondary | ICD-10-CM

## 2024-02-28 DIAGNOSIS — D509 Iron deficiency anemia, unspecified: Secondary | ICD-10-CM

## 2024-02-28 LAB — IRON,TIBC AND FERRITIN PANEL
Ferritin: 432 ng/mL — ABNORMAL HIGH (ref 30–400)
Iron Saturation: 36 % (ref 15–55)
Iron: 122 ug/dL (ref 38–169)
Total Iron Binding Capacity: 337 ug/dL (ref 250–450)
UIBC: 215 ug/dL (ref 111–343)

## 2024-02-28 LAB — CBC
Hematocrit: 39.1 % (ref 37.5–51.0)
Hemoglobin: 12.8 g/dL — ABNORMAL LOW (ref 13.0–17.7)
MCH: 30.1 pg (ref 26.6–33.0)
MCHC: 32.7 g/dL (ref 31.5–35.7)
MCV: 92 fL (ref 79–97)
Platelets: 281 10*3/uL (ref 150–450)
RBC: 4.25 x10E6/uL (ref 4.14–5.80)
RDW: 10.7 % — ABNORMAL LOW (ref 11.6–15.4)
WBC: 3.7 10*3/uL (ref 3.4–10.8)

## 2024-02-28 LAB — HEMOGLOBIN A1C
Est. average glucose Bld gHb Est-mCnc: 88 mg/dL
Hgb A1c MFr Bld: 4.7 % — ABNORMAL LOW (ref 4.8–5.6)

## 2024-04-01 ENCOUNTER — Encounter: Admitting: Physical Medicine and Rehabilitation

## 2024-05-12 ENCOUNTER — Encounter: Admitting: Physical Medicine and Rehabilitation

## 2024-08-26 ENCOUNTER — Ambulatory Visit: Payer: Self-pay | Admitting: Family
